# Patient Record
Sex: Female | Born: 1965
Health system: Southern US, Community
[De-identification: ages and names within clinical notes are randomized; demographics above are authoritative.]

## PROBLEM LIST (undated history)

## (undated) DIAGNOSIS — M1990 Unspecified osteoarthritis, unspecified site: Secondary | ICD-10-CM

## (undated) DIAGNOSIS — H409 Unspecified glaucoma: Secondary | ICD-10-CM

## (undated) DIAGNOSIS — K219 Gastro-esophageal reflux disease without esophagitis: Secondary | ICD-10-CM

## (undated) DIAGNOSIS — IMO0001 Reserved for inherently not codable concepts without codable children: Secondary | ICD-10-CM

## (undated) DIAGNOSIS — Z800 Family history of malignant neoplasm of digestive organs: Secondary | ICD-10-CM

## (undated) DIAGNOSIS — I10 Essential (primary) hypertension: Secondary | ICD-10-CM

## (undated) DIAGNOSIS — Z973 Presence of spectacles and contact lenses: Secondary | ICD-10-CM

## (undated) DIAGNOSIS — Z9221 Personal history of antineoplastic chemotherapy: Secondary | ICD-10-CM

## (undated) DIAGNOSIS — C786 Secondary malignant neoplasm of retroperitoneum and peritoneum: Secondary | ICD-10-CM

## (undated) DIAGNOSIS — E041 Nontoxic single thyroid nodule: Secondary | ICD-10-CM

## (undated) DIAGNOSIS — R351 Nocturia: Secondary | ICD-10-CM

## (undated) DIAGNOSIS — Z8719 Personal history of other diseases of the digestive system: Secondary | ICD-10-CM

## (undated) DIAGNOSIS — E119 Type 2 diabetes mellitus without complications: Secondary | ICD-10-CM

## (undated) DIAGNOSIS — G4730 Sleep apnea, unspecified: Secondary | ICD-10-CM

## (undated) DIAGNOSIS — D490 Neoplasm of unspecified behavior of digestive system: Secondary | ICD-10-CM

## (undated) DIAGNOSIS — R2689 Other abnormalities of gait and mobility: Secondary | ICD-10-CM

## (undated) DIAGNOSIS — C801 Malignant (primary) neoplasm, unspecified: Secondary | ICD-10-CM

## (undated) DIAGNOSIS — G629 Polyneuropathy, unspecified: Secondary | ICD-10-CM

## (undated) DIAGNOSIS — Z8489 Family history of other specified conditions: Secondary | ICD-10-CM

## (undated) HISTORY — PX: HERNIA REPAIR: SHX51

## (undated) HISTORY — PX: ABDOMINAL HYSTERECTOMY: SHX81

## (undated) HISTORY — DX: Secondary malignant neoplasm of retroperitoneum and peritoneum: C78.6

## (undated) HISTORY — DX: Family history of malignant neoplasm of digestive organs: Z80.0

## (undated) HISTORY — DX: Neoplasm of unspecified behavior of digestive system: D49.0

## (undated) HISTORY — DX: Malignant (primary) neoplasm, unspecified: C80.1

## (undated) HISTORY — PX: THYROIDECTOMY, PARTIAL: SHX18

## (undated) HISTORY — DX: Essential (primary) hypertension: I10

## (undated) HISTORY — PX: CATARACT EXTRACTION: SUR2

---

## 1999-11-01 ENCOUNTER — Encounter: Payer: Self-pay | Admitting: Family Medicine

## 1999-11-01 ENCOUNTER — Ambulatory Visit (HOSPITAL_COMMUNITY): Admission: RE | Admit: 1999-11-01 | Discharge: 1999-11-01 | Payer: Self-pay | Admitting: Family Medicine

## 2004-01-20 ENCOUNTER — Other Ambulatory Visit: Admission: RE | Admit: 2004-01-20 | Discharge: 2004-01-20 | Payer: Self-pay | Admitting: Family Medicine

## 2004-01-23 ENCOUNTER — Ambulatory Visit (HOSPITAL_COMMUNITY): Admission: RE | Admit: 2004-01-23 | Discharge: 2004-01-23 | Payer: Self-pay | Admitting: Family Medicine

## 2004-09-06 ENCOUNTER — Encounter (INDEPENDENT_AMBULATORY_CARE_PROVIDER_SITE_OTHER): Payer: Self-pay | Admitting: Specialist

## 2004-09-07 ENCOUNTER — Inpatient Hospital Stay (HOSPITAL_COMMUNITY): Admission: RE | Admit: 2004-09-07 | Discharge: 2004-09-08 | Payer: Self-pay | Admitting: *Deleted

## 2009-11-29 ENCOUNTER — Other Ambulatory Visit: Admission: RE | Admit: 2009-11-29 | Discharge: 2009-11-29 | Payer: Self-pay | Admitting: Family Medicine

## 2010-08-16 ENCOUNTER — Emergency Department (HOSPITAL_COMMUNITY): Payer: 59

## 2010-08-16 ENCOUNTER — Inpatient Hospital Stay (HOSPITAL_COMMUNITY)
Admission: EM | Admit: 2010-08-16 | Discharge: 2010-08-19 | DRG: 305 | Disposition: A | Discharge status: Home / Self Care | Payer: 59 | Attending: Internal Medicine | Admitting: Internal Medicine

## 2010-08-16 DIAGNOSIS — R0602 Shortness of breath: Secondary | ICD-10-CM | POA: Diagnosis present

## 2010-08-16 DIAGNOSIS — N289 Disorder of kidney and ureter, unspecified: Secondary | ICD-10-CM | POA: Diagnosis present

## 2010-08-16 DIAGNOSIS — I10 Essential (primary) hypertension: Principal | ICD-10-CM | POA: Diagnosis present

## 2010-08-16 DIAGNOSIS — J45909 Unspecified asthma, uncomplicated: Secondary | ICD-10-CM | POA: Diagnosis present

## 2010-08-16 DIAGNOSIS — E876 Hypokalemia: Secondary | ICD-10-CM | POA: Diagnosis present

## 2010-08-16 DIAGNOSIS — H409 Unspecified glaucoma: Secondary | ICD-10-CM | POA: Diagnosis present

## 2010-08-16 DIAGNOSIS — E049 Nontoxic goiter, unspecified: Secondary | ICD-10-CM | POA: Diagnosis present

## 2010-08-16 DIAGNOSIS — E7800 Pure hypercholesterolemia, unspecified: Secondary | ICD-10-CM | POA: Diagnosis present

## 2010-08-16 DIAGNOSIS — R7401 Elevation of levels of liver transaminase levels: Secondary | ICD-10-CM | POA: Diagnosis present

## 2010-08-16 DIAGNOSIS — D649 Anemia, unspecified: Secondary | ICD-10-CM | POA: Diagnosis present

## 2010-08-16 DIAGNOSIS — R7402 Elevation of levels of lactic acid dehydrogenase (LDH): Secondary | ICD-10-CM | POA: Diagnosis present

## 2010-08-16 DIAGNOSIS — K219 Gastro-esophageal reflux disease without esophagitis: Secondary | ICD-10-CM | POA: Diagnosis present

## 2010-08-16 DIAGNOSIS — Z23 Encounter for immunization: Secondary | ICD-10-CM

## 2010-08-16 LAB — CBC
HCT: 37.4 % (ref 36.0–46.0)
Hemoglobin: 12.7 g/dL (ref 12.0–15.0)
MCH: 30.3 pg (ref 26.0–34.0)
MCHC: 34 g/dL (ref 30.0–36.0)
MCV: 89.3 fL (ref 78.0–100.0)
RDW: 14.5 % (ref 11.5–15.5)

## 2010-08-16 LAB — COMPREHENSIVE METABOLIC PANEL
ALT: 111 U/L — ABNORMAL HIGH (ref 0–35)
BUN: 12 mg/dL (ref 6–23)
CO2: 23 mEq/L (ref 19–32)
Calcium: 8.4 mg/dL (ref 8.4–10.5)
Creatinine, Ser: 1.1 mg/dL (ref 0.4–1.2)
GFR calc non Af Amer: 54 mL/min — ABNORMAL LOW (ref >60)
Glucose, Bld: 107 mg/dL — ABNORMAL HIGH (ref 70–99)
Total Protein: 6.5 g/dL (ref 6.0–8.3)

## 2010-08-16 LAB — DIFFERENTIAL
Basophils Absolute: 0 10*3/uL (ref 0.0–0.1)
Basophils Relative: 0 % (ref 0–1)
Eosinophils Absolute: 0.1 10*3/uL (ref 0.0–0.7)
Eosinophils Relative: 1 % (ref 0–5)
Lymphocytes Relative: 40 % (ref 12–46)
Lymphs Abs: 2.4 10*3/uL (ref 0.7–4.0)
Monocytes Absolute: 0.4 10*3/uL (ref 0.1–1.0)
Monocytes Relative: 7 % (ref 3–12)
Neutro Abs: 3.1 10*3/uL (ref 1.7–7.7)
Neutrophils Relative %: 52 % (ref 43–77)

## 2010-08-16 LAB — URINALYSIS, ROUTINE W REFLEX MICROSCOPIC
Bilirubin Urine: NEGATIVE
Hgb urine dipstick: NEGATIVE
Ketones, ur: NEGATIVE mg/dL
Specific Gravity, Urine: 1.008 (ref 1.005–1.030)
Urobilinogen, UA: 0.2 mg/dL (ref 0.0–1.0)

## 2010-08-16 LAB — POCT CARDIAC MARKERS
CKMB, poc: <1 ng/mL — ABNORMAL LOW (ref 1.0–8.0)
Myoglobin, poc: 47.5 ng/mL (ref 12–200)

## 2010-08-16 LAB — LIPASE, BLOOD: Lipase: 27 U/L (ref 11–59)

## 2010-08-16 LAB — URINE MICROSCOPIC-ADD ON

## 2010-08-16 LAB — CARDIAC PANEL(CRET KIN+CKTOT+MB+TROPI)
Relative Index: 1.3 (ref 0.0–2.5)
Total CK: 153 U/L (ref 7–177)
Troponin I: 0.04 ng/mL (ref 0.00–0.06)

## 2010-08-16 LAB — MRSA PCR SCREENING: MRSA by PCR: NEGATIVE

## 2010-08-17 ENCOUNTER — Inpatient Hospital Stay (HOSPITAL_COMMUNITY): Payer: 59

## 2010-08-17 LAB — COMPREHENSIVE METABOLIC PANEL
ALT: 86 U/L — ABNORMAL HIGH (ref 0–35)
AST: 83 U/L — ABNORMAL HIGH (ref 0–37)
Albumin: 3 g/dL — ABNORMAL LOW (ref 3.5–5.2)
Calcium: 8.1 mg/dL — ABNORMAL LOW (ref 8.4–10.5)
GFR calc Af Amer: >60 mL/min (ref >60)
Sodium: 136 mEq/L (ref 135–145)
Total Protein: 6 g/dL (ref 6.0–8.3)

## 2010-08-17 LAB — CARDIAC PANEL(CRET KIN+CKTOT+MB+TROPI)
CK, MB: 1.8 ng/mL (ref 0.3–4.0)
Relative Index: 1.3 (ref 0.0–2.5)
Total CK: 144 U/L (ref 7–177)

## 2010-08-17 LAB — CBC
HCT: 33.4 % — ABNORMAL LOW (ref 36.0–46.0)
Hemoglobin: 11.3 g/dL — ABNORMAL LOW (ref 12.0–15.0)
MCHC: 33.8 g/dL (ref 30.0–36.0)
WBC: 7.8 10*3/uL (ref 4.0–10.5)

## 2010-08-17 LAB — T4, FREE: Free T4: 1.07 ng/dL (ref 0.80–1.80)

## 2010-08-18 LAB — HEPATIC FUNCTION PANEL
ALT: 74 U/L — ABNORMAL HIGH (ref 0–35)
AST: 51 U/L — ABNORMAL HIGH (ref 0–37)
Indirect Bilirubin: 1.5 mg/dL — ABNORMAL HIGH (ref 0.3–0.9)
Total Protein: 7.3 g/dL (ref 6.0–8.3)

## 2010-08-18 LAB — BASIC METABOLIC PANEL
BUN: 12 mg/dL (ref 6–23)
Chloride: 100 mEq/L (ref 96–112)
Glucose, Bld: 101 mg/dL — ABNORMAL HIGH (ref 70–99)
Potassium: 3.3 mEq/L — ABNORMAL LOW (ref 3.5–5.1)

## 2010-08-18 LAB — IRON AND TIBC
Iron: 43 ug/dL (ref 42–135)
TIBC: 389 ug/dL (ref 250–470)

## 2010-08-18 LAB — VITAMIN B12: Vitamin B-12: 317 pg/mL (ref 211–911)

## 2010-08-19 LAB — BASIC METABOLIC PANEL
CO2: 24 mEq/L (ref 19–32)
Calcium: 8.9 mg/dL (ref 8.4–10.5)
GFR calc Af Amer: 52 mL/min — ABNORMAL LOW (ref >60)
GFR calc non Af Amer: 43 mL/min — ABNORMAL LOW (ref >60)
Potassium: 3.8 mEq/L (ref 3.5–5.1)
Sodium: 137 mEq/L (ref 135–145)

## 2010-08-19 LAB — CBC
MCHC: 33.7 g/dL (ref 30.0–36.0)
MCV: 92.2 fL (ref 78.0–100.0)
Platelets: 336 10*3/uL (ref 150–400)
RDW: 15.3 % (ref 11.5–15.5)
WBC: 8.8 10*3/uL (ref 4.0–10.5)

## 2010-08-20 LAB — HEPATITIS PANEL, ACUTE
HCV Ab: NEGATIVE
Hep B C IgM: NEGATIVE
Hepatitis B Surface Ag: NEGATIVE

## 2010-08-20 NOTE — Discharge Summary (Signed)
Sarah [name], [name] NO.:  [id number]  MEDICAL RECORD NO.:  [id number]           PATIENT TYPE:  LOCATION:                                 FACILITY:  PHYSICIAN:  Andreas Blower, MD       DATE OF BIRTH:  [birth year]  DATE OF ADMISSION: DATE OF DISCHARGE:                              DISCHARGE SUMMARY   PRIMARY CARE PHYSICIAN:  Dr. Deatra James with [facility].  DISCHARGE DIAGNOSES: 1. Malignant hypertension/hypertensive urgency. 2. Acute shortness of breath, resolved. 3. Mild renal insufficiency. 4. Thyroid mass/multinodular thyroid goiter with normal TSH, free T4,     and T3. 5. Transaminitis. 6. Hypokalemia, resolved. 7. Mild anemia, resolved. 8. Gastroesophageal reflux disease. 9. History of glaucoma. 10.Hypercholesterolemia. 11.History of uterine fibroids, status post abdominal supracervical     hysterectomy and left salpingectomy.  DISCHARGE MEDICATIONS: 1. Amlodipine 10 mg p.o. daily. 2. Carvedilol 6.25 mg p.o. twice daily with meals. 3. Hydralazine 25 mg p.o. 3 times a day. 4. Albuterol 1-2 2 puffs inhaled every 4 hours as needed for shortness     of breath. 5. Fish oil 1 capsule p.o. daily. 6. Lumigan 1 drop both twice daily at bedtime.  BRIEF ADMITTING HISTORY AND PHYSICAL:  Sarah [name] is a 44 year old African American female with history of hypercholesterolemia and hypertension, had not been on any medications prior to admission, and GERD who presented from her primary care physician's office with shortness of breath.  She was also found to have high blood pressure with 229/129 on admission.  RADIOLOGY/IMAGING:  The patient had portable chest x-ray on August 16, 2010, which showed cardiomegaly.  Enlarged left lobe of the thyroid gland. The patient had ultrasound thyroid gland and adjacent soft tissue on August 17, 2010, which showed multinodular thyroid goiter.  The largest lesion in the left lower lobe is complex and  fine-needle aspiration/biopsy suggested.  The patient had a 2-D echocardiogram on August 17, 2010, which showed that the left ventricle cavity size was normal.  There was moderate concentric hypertrophy.  Systolic function was normal.  Ejection fraction was 55-60%.  Wall motion was normal.  There were no regional wall motion abnormalities.  Left atrium was mildly dilated.  LABORATORY DATA:  CBC shows a white count of 8.8, hemoglobin 12.7, hematocrit 37.7, and platelet count 336.  Electrolytes normal with a creatinine of 1.34.  Initial creatinine on presentation was 1.10.  Liver function tests showed T-bili is 1.7 and direct is 0.2, indirect is 1.5, alk phos is 44, AST is 51, ALT is 74, total protein is 7.3, albumin is 3.6, calcium is 8.9, magnesium is 1.9.  Troponins negative x3.  BNP was 583.  TSH was 0.996, free T4 is 1.07, T3 is 85.9.  Serum iron is 43, TIBC is 389, and UIBC is 346.  Vitamin B12 is 317, serum folate is 9.7, and ferritin is 227.  UA was negative for nitrates and leukocytes.  MRSA by PCR was negative.  HOSPITAL COURSE BY PROBLEM:1. Malignant hypertension/hypertensive urgency:  Initially, the     patient was admitted to the Step-[facility] and was started on  a     nitroglycerin drip to help control her blood pressure.  She was     also started on hydralazine.  She was started on amlodipine and     carvedilol during the course of the hospital stay and was     eventually weaned off nitroglycerin drip.  IV hydralazine was     transitioned to p.o. At the time of discharge, the patient's blood     pressure is 131/78.  The patient was instructed to follow up with     Dr. Wynelle Link for further titration of her antihypertensive medications.     We would consider having her be titrated off hydralazine in favor     of another agent if necessary.  But we will defer to her primary     care physician. 2. Dyspnea/shortness of breath:  The patient was admitted and was     ruled out for acute  coronary syndrome.  2-D echocardiogram was     obtained with the above results to assess for any diastolic     dysfunction.  The patient did not have any diastolic dysfunction on     the echo results.  It was initally thought she had pulmonary     edema and as a result she was given 2 doses of Lasix.  As     her blood pressure was controlled, her breathing had improved and     was at baseline at the time of discharge.  I suspect that her     shortness of breath may be due to hypertensive urgency. 3. Mild renal insufficiency:  At presentation, her creatinine was     1.10.  Her creatinine at the time of discharge was 1.34.  I am     uncertain if the rise in her creatinine is due to Lasix versus     relative hyperperfusion from better control of her blood pressure.     The patient was instructed to follow up with her primary care     physician and have her renal function checked and if still elevated     could consider further evaluation at that time. 4. Thyroid mass/multinodular thyroid goiter:  TSH, free T4, and T3     were normal.  The patient had ultrasound of thyroid gland with     results as dictated above.  IR was consulted for biopsy, however,     could not be done in due time.  As the patient is stable with     normal TSH, free T4, and T3, the fine needle aspiration and biopsy     could be done as an outpatient on an nonemergent basis under the     care of her primary care physician. 5. Transaminitis:  Hepatitis panel is pending.  Dr. Wynelle Link, her primary     care physician to follow up the results of the hepatitis panel.     Consider checking her liver function test as outpatient and if     still elevated could consider doing an ultrasound for further     evaluation, but we will defer to her primary care physician. 6. Hypokalemia, most likely secondary to Lasix use, resolved with     replacement. 7. Mild anemia:  I suspect dilution of hemoglobin was normal at the     time of  discharge. 8. GERD:  It was not an active issue at this time. 9. History of glaucoma:  Continue the patient on home eye drops. 10.Hypercholesterolemia:  Continue the patient on fish oil at the time     of discharge.  Time spent on discharge talking to the patient, counseling the patient, and coordinating care was 35 minutes.  DISPOSITION AND FOLLOWUP:  The patient is to follow up with Dr. Wynelle Link in 1 week.  Dr. Wynelle Link to address her blood pressure and further titration off antihypertensive medications as necessary.  Dr. Wynelle Link to also arrange for fine-needle aspiration and biopsy of thyroid mass.  Dr. Wynelle Link to follow up on the hepatitis panel and order liver function tests as outpatient. If abnormal, consider further work at that time.  Dr. Wynelle Link to also follow up on the patient's electrolytes and creatinine and if abnormal further workup at that time.   Andreas Blower, MD   SR/MEDQ  D:  08/19/2010  T:  08/20/2010  Job:  [id number]  Electronically Signed by Wardell Heath Kyla Duffy  on 08/20/2010 09:23:38 PM

## 2010-08-22 ENCOUNTER — Other Ambulatory Visit: Payer: Self-pay | Admitting: Family Medicine

## 2010-08-22 DIAGNOSIS — E042 Nontoxic multinodular goiter: Secondary | ICD-10-CM

## 2010-08-22 NOTE — H&P (Signed)
Sarah [name], MANWARREN NO.:  [id number]  MEDICAL RECORD NO.:  [id number]           PATIENT TYPE:  I  LOCATION:  2114                         FACILITY:  [facility]  PHYSICIAN:  Vania Rea, M.D. DATE OF BIRTH:  Aug 03, 1965  DATE OF ADMISSION:  08/16/2010 DATE OF DISCHARGE:                             HISTORY & PHYSICAL   PRIMARY CARE PHYSICIAN:  Deatra James, MD with [facility].  CHIEF COMPLAINT:  Shortness of breath.  HISTORY OF PRESENT ILLNESS:  Sarah [name] is a 45 year old African- American female with past medical history of high cholesterol, hypertension, and GERD, who presents from primary care physician's office today with shortness of breath.  The patient reports symptoms have been ongoing since early summer at which time, she was prescribed albuterol inhaler by primary care physician for question of asthma.  The patient states albuterol has been ineffective in relieving her symptoms and symptoms have become notably worse over this past week.  The patient states she feels like she has used the entire inhaler this week alone. She also reports recent development of dyspnea on exertion and dependent edema.  The patient denies any real change in her shortness of breath when lying down.  She denies any recent fever, abdominal pain, nausea, vomiting, or recent weight loss.  The patient does report audible wheeze as well as some intermittent midsternal chest pressure associated with her shortness of breath.  The patient was diagnosed in 2008 with hypertension during preop workup for hysterectomy, the patient was prescribed medication at that time, however, was taken off postoperatively as blood pressure was controlled without medications.  The patient also states that during preop workup for hysterectomy she experienced an irregular heart beat and underwent a stress test that was negative.  The patient is uncertain of irregular heartbeat and no record  of this in E-chart.  PAST MEDICAL HISTORY: 1. GERD. 2. History of hypertension, no longer on medication. 3. Uterine fibroids status post hysterectomy in 2008. 4. Hypercholesterolemia. 5. Glaucoma. 6. Question of cardiac arrhythmia in 2008 that is not documented in     the E-chart.  MEDICATIONS: 1. Albuterol inhaler 1-2 puffs q.4 h p.r.n. shortness of breath. 2. Lumigan eyedrops 1 drop both eyes at bedtime. 3. Fish oil over-the-counter 1 capsule p.o. daily.  ALLERGIES:  No known drug allergies.  FAMILY HISTORY:  The patient's mother alive with history of diabetes, hypertension, and what the patient suspects was a thyroidectomy for unknown reasons.  Father with hypertension, diabetes, and coronary artery disease.  One brother with hypertension.  SOCIAL HISTORY:  The patient is married.  She has two children.  She works full-time for [employer] in [employer].  She is a nonsmoker, nondrinker.  REVIEW OF SYSTEMS:  As stated in HPI, otherwise negative.  PHYSICAL EXAM:  VITAL SIGNS:  Blood pressure 229/129 on arrival, now down to 197/123, heart rate 80, respirations 24, temperature 98.7, O2 sat 100% on room air. GENERAL:  This is a overweight African-American female sitting upright in stretcher, in no acute distress. HEENT:  Head is normocephalic, atraumatic.  Eyes, extraocular movements are intact without any scleral  icterus or injection.  Ear, nose, and throat, mucous membranes are moist.  No oropharyngeal lesions are noted. NECK:  Supple.  Unable to palpate any thyromegaly.  No JVD or carotid bruits. CHEST:  With symmetrical movement, nontender to palpation. CARDIOVASCULAR:  S1 and S2, regular rate and rhythm.  No murmur, rub, or gallop.  No lower extremity edema.  RESPIRATORY:  Lung sounds are clear to auscultation bilaterally.  No wheezes, rales, or crackles.  No increased work of breathing.  GI:  Abdomen is soft, nontender, nondistended with positive bowel sounds.  No  appreciated masses or hepatosplenomegaly.  NEUROLOGICALLY:  The patient is able to move all extremities x4 without motor sensory deficit on exam. PSYCHOLOGICALLY:  The patient is alert and oriented x4 with pleasant mood and affect.  PERTINENT LABS AND ANCILLARY STUDIES:  White cell count 6.0, platelet count 321, hemoglobin 12.7, hematocrit 37.4.  Sodium 135, potassium 3.2, chloride 107, CO2 of 23, BUN 12, creatinine 1.10, serum glucose 107, total bilirubin 0.7, alkaline phosphatase 46, AST 133, ALT 111, albumin 3.4, calcium 8.4, lipase 27.  Cardiac point-of-care negative x1.  Urinalysis, yellow clear with specific gravity 1.008, 100 protein, otherwise unremarkable.  Urine microscopic is unremarkable.  Chest x-ray shows cardiomegaly with enlarged left lobe of the thyroid causing tracheal deviation.  EKG shows sinus rhythm with no acute T-wave abnormalities.  No evidence of LVH  ASSESSMENT/PLAN: 1. Hypertensive urgency.  Question related to systemic disease versus     reaction to frequent use of inhalers throughout the week.  The     patient has responded well in the emergency department to     nitroglycerin drip.  We will admit to ICU and continue drip at this     time.  Also order for p.r.n. hydralazine.  It does not seem like     the patient's heart rate will tolerate beta-blocker therapy at this     time.  In addition, creatinine is at borderline, therefore we will     add amlodipine for daily treatment of hypertension. 2. Dyspnea.  This could be related to tracheal deviation seen on chest     x-ray in setting of thyromegaly, however, given hypertensive     urgency and cardiomegaly seen on chest x-ray, we will work up     further with cardiac enzymes to rule out acute coronary syndrome as     well as 2-D echocardiogram to assess for any diastolic dysfunction.     We will check thyroid function tests. 3. Thyromegaly.  This is a new finding during this hospitalization     with  the enlargement of the left lobe of the thyroid gland causing     tracheal deviation.  The patient's symptoms have been ongoing for     several months and she is in no acute distress at time of     admission.  We will further evaluate with thyroid ultrasound.     Would consider ENT consult in the morning for further treatment and     possible surgical intervention. 4. Transaminitis.  Uncertain whether this is reactive.  Exam is     completely benign.  We will monitor. 5. Hypokalemia.  We will replete and recheck in the morning. 6. Prophylaxis.  We will order for SCDs for DVT prophylaxis.     Cordelia Pen, NP   ______________________________ Vania Rea, M.D.    LE/MEDQ  D:  08/16/2010  T:  08/16/2010  Job:  [id number]  cc:   Charise Carwin  Wynelle Link, M.D.  Electronically Signed by Cordelia Pen NP on 08/20/2010 10:03:55 AM Electronically Signed by Vania Rea M.D. on 08/22/2010 02:29:01 AM

## 2010-08-29 ENCOUNTER — Other Ambulatory Visit: Payer: Self-pay | Admitting: Diagnostic Radiology

## 2010-08-29 ENCOUNTER — Other Ambulatory Visit (HOSPITAL_COMMUNITY)
Admission: RE | Admit: 2010-08-29 | Discharge: 2010-08-29 | Disposition: A | Discharge status: Home / Self Care | Payer: 59 | Source: Ambulatory Visit | Attending: Diagnostic Radiology | Admitting: Diagnostic Radiology

## 2010-08-29 ENCOUNTER — Ambulatory Visit
Admission: RE | Admit: 2010-08-29 | Discharge: 2010-08-29 | Disposition: A | Discharge status: Home / Self Care | Payer: 59 | Source: Ambulatory Visit | Attending: Family Medicine | Admitting: Family Medicine

## 2010-08-29 DIAGNOSIS — E049 Nontoxic goiter, unspecified: Secondary | ICD-10-CM | POA: Insufficient documentation

## 2010-08-29 DIAGNOSIS — E042 Nontoxic multinodular goiter: Secondary | ICD-10-CM

## 2010-10-17 ENCOUNTER — Encounter (HOSPITAL_COMMUNITY)
Admission: RE | Admit: 2010-10-17 | Discharge: 2010-10-17 | Disposition: A | Discharge status: Home / Self Care | Payer: 59 | Source: Ambulatory Visit | Attending: General Surgery | Admitting: General Surgery

## 2010-10-17 ENCOUNTER — Ambulatory Visit (HOSPITAL_COMMUNITY)
Admission: RE | Admit: 2010-10-17 | Discharge: 2010-10-17 | Disposition: A | Discharge status: Home / Self Care | Payer: 59 | Source: Ambulatory Visit | Attending: General Surgery | Admitting: General Surgery

## 2010-10-17 ENCOUNTER — Other Ambulatory Visit (HOSPITAL_COMMUNITY): Payer: Self-pay | Admitting: General Surgery

## 2010-10-17 DIAGNOSIS — Z01818 Encounter for other preprocedural examination: Secondary | ICD-10-CM | POA: Insufficient documentation

## 2010-10-17 DIAGNOSIS — Z01812 Encounter for preprocedural laboratory examination: Secondary | ICD-10-CM | POA: Insufficient documentation

## 2010-10-17 DIAGNOSIS — E041 Nontoxic single thyroid nodule: Secondary | ICD-10-CM

## 2010-10-17 DIAGNOSIS — E049 Nontoxic goiter, unspecified: Secondary | ICD-10-CM | POA: Insufficient documentation

## 2010-10-17 DIAGNOSIS — Z01810 Encounter for preprocedural cardiovascular examination: Secondary | ICD-10-CM | POA: Insufficient documentation

## 2010-10-17 DIAGNOSIS — I517 Cardiomegaly: Secondary | ICD-10-CM | POA: Insufficient documentation

## 2010-10-17 LAB — CBC
MCH: 29.5 pg (ref 26.0–34.0)
MCV: 86.7 fL (ref 78.0–100.0)
Platelets: 407 10*3/uL — ABNORMAL HIGH (ref 150–400)
RDW: 13.9 % (ref 11.5–15.5)
WBC: 7.3 10*3/uL (ref 4.0–10.5)

## 2010-10-17 LAB — COMPREHENSIVE METABOLIC PANEL
Albumin: 3.6 g/dL (ref 3.5–5.2)
BUN: 11 mg/dL (ref 6–23)
Creatinine, Ser: 0.77 mg/dL (ref 0.4–1.2)
GFR calc Af Amer: >60 mL/min (ref >60)
Total Protein: 8.1 g/dL (ref 6.0–8.3)

## 2010-10-17 LAB — SURGICAL PCR SCREEN: MRSA, PCR: NEGATIVE

## 2010-10-23 ENCOUNTER — Observation Stay (HOSPITAL_COMMUNITY)
Admission: RE | Admit: 2010-10-23 | Discharge: 2010-10-24 | Disposition: A | Discharge status: Home / Self Care | Payer: 59 | Source: Ambulatory Visit | Attending: General Surgery | Admitting: General Surgery

## 2010-10-23 ENCOUNTER — Other Ambulatory Visit (INDEPENDENT_AMBULATORY_CARE_PROVIDER_SITE_OTHER): Payer: Self-pay | Admitting: General Surgery

## 2010-10-23 DIAGNOSIS — Z01812 Encounter for preprocedural laboratory examination: Secondary | ICD-10-CM | POA: Insufficient documentation

## 2010-10-23 DIAGNOSIS — J45909 Unspecified asthma, uncomplicated: Secondary | ICD-10-CM | POA: Insufficient documentation

## 2010-10-23 DIAGNOSIS — Z01810 Encounter for preprocedural cardiovascular examination: Secondary | ICD-10-CM | POA: Insufficient documentation

## 2010-10-23 DIAGNOSIS — Z01818 Encounter for other preprocedural examination: Secondary | ICD-10-CM | POA: Insufficient documentation

## 2010-10-23 DIAGNOSIS — E049 Nontoxic goiter, unspecified: Principal | ICD-10-CM | POA: Insufficient documentation

## 2010-10-23 DIAGNOSIS — K219 Gastro-esophageal reflux disease without esophagitis: Secondary | ICD-10-CM | POA: Insufficient documentation

## 2010-10-23 DIAGNOSIS — I10 Essential (primary) hypertension: Secondary | ICD-10-CM | POA: Insufficient documentation

## 2010-10-26 NOTE — Discharge Summary (Signed)
Sarah [name], Sarah [name]               ACCOUNT NO.:  [id number]   MEDICAL RECORD NO.:  [id number]          PATIENT TYPE:  INP   LOCATION:  0450                         [facility]ACILITY:  [facility]   PHYSICIAN:  Almedia Balls. Fore, M.D.   DATE OF BIRTH:  1965/10/12   DATE OF ADMISSION:  09/06/2004  DATE OF DISCHARGE:  09/08/2004                                 DISCHARGE SUMMARY   HISTORY:  The patient is a 45 year old with abnormal uterine bleeding,  pelvic pain, uterine enlargement and left adnexal cyst for hysterectomy,  possible bilateral salpingo-oophorectomy.  The remainder of her history and  physical are as previously dictated.   LABORATORY DATA:  Hemoglobin 14.1 preoperatively.  BMET panel normal except  for glucose slightly elevated at 107.  Electrocardiogram showed T wave  abnormalities with possible anterolateral ischemia and a T wave abnormality  with possible inferior ischemia.  The patient had been evaluated by her  cardiologist with findings of normal cardiac function prior to surgery.   HOSPITAL COURSE:  The patient was taken to the operating room on September 06, 2004 at which time abdominal supracervical hysterectomy, left salpingectomy  for large left hydrosalpinx and bilateral ovarian cyst destruction were  carried out.  The patient did well postoperatively.  Diet and ambulation  were progressed over several days postoperatively.  She had problems with  pain control and was maintained in the hospital through March 31.  On the  morning of April 1, she was afebrile and experiencing no problems except for  pain which was relieved by oral analgesics, and it was felt that she could  be discharged at this time.   FINAL DIAGNOSES:  1.  Abnormal uterine bleeding.  2.  Pelvic pain.  3.  Uterine enlargement.  4.  Left hydrosalpinx.  5.  Bilateral ovarian cyst.   Pathology report unavailable at the time of dictation.   DISPOSITION:  Discharged home to return to the office in two weeks for  followup.  She was fully ambulatory, on a regular diet, and in good  condition at the time of discharge.  She was instructed to gradually  progress her activities over several weeks at home and to limit lifting and  driving for two weeks.  She was given prescriptions for [facility]  generic, #30, to be taken 1-2 q.6h. p.r.n. pain.  Doxycycline 100 mg, #12,  to be taken 1 b.i.d.      SRF/MEDQ  D:  09/08/2004  T:  09/08/2004  Job:  [id number]

## 2010-10-26 NOTE — Op Note (Signed)
[name], Sarah [name]               ACCOUNT NO.:  [id number]   MEDICAL RECORD NO.:  [id number]          [name] TYPE:  AMB   LOCATION:  DAY                          FACILITY:  [facility]   PHYSICIAN:  Almedia Balls. Fore, M.D.   DATE OF BIRTH:  May 19, 1966   DATE OF PROCEDURE:  09/06/2004  DATE OF DISCHARGE:                                 OPERATIVE REPORT   PREOPERATIVE DIAGNOSES:  1.  Abnormal uterine bleeding.  2.  Pelvic pain.  3.  Uterine enlargement.  4.  Left adnexal cyst.   POSTOPERATIVE DIAGNOSES:  1.  Abnormal uterine bleeding.  2.  Pelvic pain.  3.  Uterine enlargement.  4.  Left adnexal cyst.  5.  Pending pathology.   OPERATION:  Abdominal supracervical hysterectomy, left salpingectomy.   ANESTHESIA:  General orotracheal anesthesia.   SURGEON:  Almedia Balls. Sarah [name], M.D.   FIRST ASSISTANT:  Leatha Gilding. Mezer, M.D.   INDICATIONS FOR PROCEDURE:  [name] is a 45 year old with the above noted  problems who has been counseled as to the need for surgery and the type of  surgery to be performed.  She has also been counseled as to the risks  involved to include risks of anesthesia, injury to bowel, bladder, blood  vessels, ureters, postoperative hemorrhage, infection, recuperation and use  of hormone replacement should her ovaries be removed.  She fully understands  all these considerations and wishes to proceed on September 06, 2004.   FINDINGS:  On entry into the abdomen, exploration of the upper abdomen  revealed omental adhesions at the intraperitoneal area below the umbilicus  thought to be secondary to previous laparoscopy.  Palpation of the lower  liver edge, gallbladder area, spleen, kidneys, para-aortic areas and  visualization and palpation of the appendix showed these organs to be  normal.  In the pelvis, the uterus was enlarged to approximately 12+ weeks  size.  There were obvious myometrial myomata present.  There was a large  cyst in the left tube.  Both ovaries had multiple  small cysts present.  The  ovaries otherwise were normal.   DESCRIPTION OF PROCEDURE:  With the [name] under general anesthesia,  prepared and draped in the usual sterile fashion, with the Foley catheter in  the bladder, a lower abdominal transverse incision was made and carried into  the peritoneal cavity without difficulty.  Self-retaining retractor was  placed, and the bowel was packed off.  The left tube was excised using Bovie  electrocoagulation for better visualization because of the size of the cyst  which was approximately 6 to 7 cm in greatest dimension.  This appeared to  be simple and hydrosalpinx probably secondary to previous tubal ligation.  Kelly clamps were placed at the uterine ovarian anastomoses tubes and round  ligaments bilaterally for traction and hemostasis.  Round ligaments  bilaterally were transected using Bovie electrocoagulation with development  of a bladder flap anteriorly and entry into retroperitoneal, paravesical and  pararectal spaces.  Uterine vessels were then skeletonized, clamped, cut and  suture ligated with #1 chromic catgut.  Cardinal ligaments bilaterally were  clamped using  Heaney clamps, cut and suture ligated with #1 chromic catgut.  The uterine fundus was then excised by using Bovie electrocoagulation to  core out the endocervix and portion of the exocervix.  The cervical stump  area and some vaginal tissue were reapproximated and rendered hemostatic  with interrupted sutures of #1 chromic catgut.  The area was lavaged with  copious amounts of lactated Ringer's solution and after noting hemostasis  was maintained in the surgical area, the area was reperitonealized with  suture of #1 chromic catgut.  The ovarian pedicles were suspended from the  round ligaments bilaterally with interrupted sutures of #1 chromic catgut.  The area was again inspected for hemostasis and after noting this was  maintained and that sponge and instrument counts were  correct, the  peritoneum was closed with a continuous suture of 0 Vicryl.  Each layer was  lavaged fully with lactated Ringer's solution prior to full closure.  Fascia  was closed with two sutures of 0 Vicryl which were brought from the lateral  aspects of the incision and tied separately in the midline.  Subcutaneous  fat was reapproximated with interrupted horizontal mattress sutures of #1  chromic catgut.  Skin was closed with subcuticular suture of 3-0 plain  catgut.   ESTIMATED BLOOD LOSS:  100 mL or less.   [name] was taken to the recovery room in good condition with clear urine in  the Foley catheter tubing.  She will be placed on a 23-hour observation  following surgery.      SRF/MEDQ  D:  09/06/2004  T:  09/06/2004  Job:  [id number]   cc:   Leatha Gilding. Mezer, M.D.  [address]  [place]  [place] [address]  Fax: [phone]

## 2010-10-26 NOTE — H&P (Signed)
NAMENISHKA, Sarah [name]               ACCOUNT NO.:  [id number]   MEDICAL RECORD NO.:  [id number]          PATIENT TYPE:  AMB   LOCATION:  DAY                          FACILITY:  [facility]   PHYSICIAN:  Almedia Balls. Fore, M.D.   DATE OF BIRTH:  12/07/1965   DATE OF ADMISSION:  09/06/2004  DATE OF DISCHARGE:                                HISTORY & PHYSICAL   CHIEF COMPLAINT:  Pain, fibroids, abnormal bleeding.   HISTORY:  The patient is a 45 year old, gravida 2, para 2, whose last  menstrual period was August 07, 2004.  She was initially seen in our  office in August of 2005 for evaluation of enlarged uterus and abnormal  bleeding.  She underwent a saline sonogram with findings of probable myomata  and an increased endometrial stripe.  She underwent hysterectomy D&C in  October of 2005 with findings of hyperplasia in the endometrium without  atypia.  She has been followed since that time for unusual bleeding.  She  has continued to have abnormal bleeding and pain.  The uterus under  examination was found to be 16-[redacted] weeks gestational size, and irregular with  probable myomata.  Because of the continued abnormal bleeding and pain and  enlarged uterus, she is admitted at this time for hysterectomy, possible  bilateral salpingo-oophorectomy.  Pap smear was normal in August of 2005.  She has been fully counseled as to the nature of the procedure and the risks  involved, to include risks of anesthesia, injury to bowel, bladder, blood  vessels, ureters, postoperative hemorrhage, infection, recuperation, and  possible hormone replacement should her ovaries be removed.  She fully  understands all these considerations and wishes to proceed on September 06, 2004.   PAST MEDICAL HISTORY:  1.  Tubal ligation in 1994.  2.  The above-noted hysteroscopy D&C in 2005.   MEDICATIONS:  Maxzide 50/75 daily for hypertension.   PRIMARY CARE PHYSICIAN:  She is followed by Dr. Wynelle Link with [facility].   ALLERGIES:  She is allergic to no medications.   SOCIAL HISTORY:  She is a nonsmoker and nondrinker.   FAMILY HISTORY:  Maternal aunts with uterine cancer.  An older sister who  underwent hysterectomy for myomata, as well.   REVIEW OF SYSTEMS:  HEENT:  Negative.  CARDIORESPIRATORY:  As noted above  with hypertension.  GASTROINTESTINAL:  Occasional indigestion problems.  GENITOURINARY:  As in present illness.  NEUROMUSCULAR:  Negative.   PHYSICAL EXAMINATION:  VITAL SIGNS:  Height 5 feet and 4-1/4 inches.  Weight  213 pounds.  Blood pressure has ranged from 132/80 to a high of 170/110.  She has been evaluated by Dr. Wynelle Link for this problem and found to have a  labile type hypertension, which generally is in a lower range with  medication.  Pulse 84, respirations 18.  GENERAL:  Well-developed black female in no acute distress.  HEENT:  Within normal limits.  NECK:  Supple without masses, adenopathy, or bruits.  HEART:  Regular rate and rhythm without murmurs.  LUNGS:  Clear to percussion and auscultation.  BREASTS:  Breasts  examined sitting and lying without mass.  ABDOMEN:  Flat and soft with mass effect above the symphysis pubis to a  level just below the umbilicus.  PELVIC:  Her external genital, Bartholin's urethra, Skene's glands within  normal limits.  Cervix slightly inflamed.  Uterus is mid position,  approximately 16-[redacted] weeks gestational size.  I cannot palpate adnexal  masses, but small cystic areas on the ovaries on ultrasound were noted.  Anterior and posterior cul-de-sac is confirmatory.  EXTREMITIES:  Within normal limits.  Central nervous system grossly intact.  SKIN:  Without suspicious lesions.   IMPRESSION:  Abnormal uterine bleeding.  Probable leiomyomata uteri.   DISPOSITION:  As noted above.      SRF/MEDQ  D:  08/30/2004  T:  08/30/2004  Job:  [id number]

## 2010-10-29 NOTE — Op Note (Signed)
Sarah [name], Sarah [name]        ACCOUNT NO.:  [id number]  MEDICAL RECORD NO.:  [id number]           PATIENT TYPE:  O  LOCATION:  SDSC                         FACILITY:  [facility]  PHYSICIAN:  Ollen Gross. Vernell Morgans, M.D. DATE OF BIRTH:  Nov 22, 1965  DATE OF PROCEDURE:  10/23/2010 DATE OF DISCHARGE:                              OPERATIVE REPORT   PREOPERATIVE DIAGNOSIS:  Large left thyroid nodule with compressive symptoms.  POSTOPERATIVE DIAGNOSIS:  Large left thyroid nodule with compressive symptoms.  PROCEDURE:  Left thyroid lobectomy with isthmus and pyramidal lobe.  SURGEON:  Ollen Gross. Vernell Morgans, M.D.  ASSISTANT:  Velora Heckler, MD  ANESTHESIA:  General endotracheal.  PROCEDURE IN DETAIL:  After informed consent was obtained, the patient was brought to the operating room, placed in the supine position on the operating room table.  After an induction of general anesthesia, a roll was placed behind the patient's shoulders to extend the neck slightly. The patient's neck and chest area were then prepped with ChloraPrep, allowed to dry, and then draped in usual sterile manner.  A low transverse Kocher incision was made along the neck with a 15 blade knife.  This incision was carried down through the skin and subcutaneous tissue sharply with the electrocautery.  The platysma was also divided with the electrocautery.  The platysma layer was grasped with Allis clamps and subplatysmal flaps were created superiorly and inferiorly by combination of blunt finger dissection and some sharp dissection with the electrocautery.  One vein had to be ligated with a 2-0 silk suture ligature.  Next, Mahorner retractor was deployed.  The dissection was then carried down along the midline of the neck until the trachea and isthmus of the thyroid gland were identified.  The strap muscles were then dissected off the anterior surface of the thyroid gland.  This was done by a combination of blunt dissection  with a peanut or some sharp dissection with the electrocautery.  Attention was first turned to the left thyroid lobe.  The strap muscles were retracted laterally with ArmyArtist.  The anterior surface of the thyroid gland was cleared by a blunt finger dissection.  Once we were able to identify the lateral edge of the thyroid gland, we were then able to start to gently roll it up anterior medial.  The dissection at this point was carried out with a baby right angle clamp.  Several small vessels were controlled with small clips and divided with the Harmonic scalpel.  Dissection was carried right along the capsule of the thyroid gland to try to avoid injury to the recurrent laryngeal nerve or parathyroid glands.  The superior pole also was dissected bluntly with right angle clamp.  The vessels were controlled with medium clips and then divided with the Harmonic scalpel.  Similar dissection was carried on the inferior pole with the vessels being controlled with a medium clip and divided with Harmonic scalpel.  Once this was accomplished, then we were able to get more mobilization of the thyroid gland and rotated further medially and anteriorly.  We did identify the parathyroid glands and we felt like the recurrent laryngeal nerve was actually  fairly far posterior to where we were dissecting.  Once we had the gland rolled up, we were then able to separate the gland from the anterior surface of the trachea sharply with the electrocautery.  The isthmus was also mobilized.  There was a large nodule in the isthmus of the thyroid gland.  The right lobe was palpated and felt fairly normal.  The pyramidal lobe just to the right of midline was dissected free sharply with the electrocautery.  We then chose a site where the isthmus of the thyroid gland joined the right lobe where the thyroid gland appeared to be normal and we divided the thyroid gland at this point with the Harmonic scalpel.   So, we took the large nodule on the isthmus with the specimen.  Once this specimen was then removed, it was oriented with a stitch on the left superior pole and sent to pathology for further evaluation.  The right thyroid lobe raw surface was controlled with a couple of 3-0 Vicryl U stitches and then the raw surface was also fulgurated with cautery.  The area was examined and found to be completely hemostatic.  The wound was irrigated with copious amounts of saline.  Some small pieces of Surgicel were placed in the operative bed on the left and again I believe that the recurrent laryngeal nerve was quite deep to where we were dissecting.  The strap muscles were then reapproximated along the midline with interrupted 3-0 Vicryl stitches.  The platysma layer was then also reapproximated with interrupted 3-0 Vicryl stitches and the skin was then closed with a running 4-0 Monocryl subcuticular stitch.  Dermabond dressing was applied.  The patient tolerated the procedure well.  At the end of the case all needle, sponge, and instrument counts were correct.  The patient was then awakened and taken to recovery room in stable condition.     Ollen Gross. Vernell Morgans, M.D.     PST/MEDQ  D:  10/23/2010  T:  10/23/2010  Job:  [id number]  Electronically Signed by Chevis Pretty III M.D. on 10/29/2010 01:57:04 PM

## 2010-11-02 ENCOUNTER — Encounter (INDEPENDENT_AMBULATORY_CARE_PROVIDER_SITE_OTHER): Payer: Self-pay | Admitting: General Surgery

## 2010-11-26 NOTE — Discharge Summary (Signed)
  NAMEFALAN, HENSLER NO.:  [id number]  MEDICAL RECORD NO.:  [id number]  LOCATION:  5157                         FACILITY:  [facility]  PHYSICIAN:  Ollen Gross. Vernell Morgans, M.D. DATE OF BIRTH:  September 18, 1965  DATE OF ADMISSION:  10/23/2010 DATE OF DISCHARGE:  10/24/2010                              DISCHARGE SUMMARY   [name] is a 45 year old female who underwent a left thyroid lobectomy.  She was kept overnight for observation.  She had baseline hypertension which was controlled with p.r.n. labetalol.  She did well overnight and had no issues.  Her voice was strong the next day and her incision looked good and she was discharged home on Oct 24, 2010.  MEDICATIONS:  She was to resume her home meds and she was given a prescription for pain medicine.  Her diet is as tolerated.  Activities as tolerated.  Follow up will be Dr. Carolynne Edouard in 2 weeks.  FINAL DIAGNOSIS:  Left thyroid nodule.  She is discharged home.     Ollen Gross. Vernell Morgans, M.D.     PST/MEDQ  D:  11/13/2010  T:  11/14/2010  Job:  [id number]  Electronically Signed by Chevis Pretty III M.D. on 11/26/2010 07:28:00 AM

## 2010-12-07 ENCOUNTER — Encounter (INDEPENDENT_AMBULATORY_CARE_PROVIDER_SITE_OTHER): Payer: Self-pay | Admitting: General Surgery

## 2010-12-10 ENCOUNTER — Ambulatory Visit (INDEPENDENT_AMBULATORY_CARE_PROVIDER_SITE_OTHER): Payer: 59 | Admitting: General Surgery

## 2010-12-10 VITALS — Temp 96.6°F | Wt 206.2 lb

## 2010-12-10 DIAGNOSIS — Z9889 Other specified postprocedural states: Secondary | ICD-10-CM

## 2010-12-10 DIAGNOSIS — E890 Postprocedural hypothyroidism: Secondary | ICD-10-CM

## 2010-12-10 DIAGNOSIS — E041 Nontoxic single thyroid nodule: Secondary | ICD-10-CM

## 2010-12-10 LAB — TSH: TSH: 1.867 u[IU]/mL (ref 0.350–4.500)

## 2010-12-10 LAB — T4, FREE: Free T4: 1.01 ng/dL (ref 0.80–1.80)

## 2010-12-10 NOTE — Patient Instructions (Signed)
We will call with lab results   

## 2010-12-10 NOTE — Progress Notes (Signed)
Subjective:     Patient ID: Sarah [name], female   DOB: 08-25-1965, 45 y.o.   MRN: [id number]    Temp(Src) 96.6 F (35.9 C) (Temporal)  Wt 206 lb 3.2 oz (93.532 kg)    HPI 45 yo bf who is about 6 weeks out from a left thyroid lobectomy for a large benign nodule causing compression. Her voice is strong. Swallowing has improved a lot. She only complains of not sleeping well and her hands and feet stay cold.  Review of Systems     Objective:   Physical Exam Neck is soft. Incision is healing nicely. Voice is strong    Assessment:     Post op    Plan:     Will obtain TSH and T4 levels today. Will call her with results. If normal will turn her care back over to her medical doctor. If not then may need to start her on thyroid replacement

## 2012-12-25 ENCOUNTER — Other Ambulatory Visit (HOSPITAL_COMMUNITY): Payer: Self-pay | Admitting: Internal Medicine

## 2012-12-25 DIAGNOSIS — Z1231 Encounter for screening mammogram for malignant neoplasm of breast: Secondary | ICD-10-CM

## 2012-12-29 ENCOUNTER — Ambulatory Visit (HOSPITAL_COMMUNITY)
Admission: RE | Admit: 2012-12-29 | Discharge: 2012-12-29 | Disposition: A | Discharge status: Home / Self Care | Payer: No Typology Code available for payment source | Source: Ambulatory Visit | Attending: Internal Medicine | Admitting: Internal Medicine

## 2012-12-29 DIAGNOSIS — Z1231 Encounter for screening mammogram for malignant neoplasm of breast: Secondary | ICD-10-CM | POA: Insufficient documentation

## 2014-06-28 ENCOUNTER — Encounter (HOSPITAL_COMMUNITY): Payer: Self-pay | Admitting: Emergency Medicine

## 2014-06-28 ENCOUNTER — Inpatient Hospital Stay (HOSPITAL_COMMUNITY)
Admission: EM | Admit: 2014-06-28 | Discharge: 2014-07-01 | DRG: 552 | Disposition: A | Discharge status: Home / Self Care | Payer: Medicaid Other | Attending: Internal Medicine | Admitting: Internal Medicine

## 2014-06-28 ENCOUNTER — Emergency Department (HOSPITAL_COMMUNITY): Payer: Medicaid Other

## 2014-06-28 DIAGNOSIS — E669 Obesity, unspecified: Secondary | ICD-10-CM | POA: Diagnosis present

## 2014-06-28 DIAGNOSIS — Z6837 Body mass index (BMI) 37.0-37.9, adult: Secondary | ICD-10-CM

## 2014-06-28 DIAGNOSIS — R590 Localized enlarged lymph nodes: Secondary | ICD-10-CM | POA: Diagnosis present

## 2014-06-28 DIAGNOSIS — Z90710 Acquired absence of both cervix and uterus: Secondary | ICD-10-CM

## 2014-06-28 DIAGNOSIS — M549 Dorsalgia, unspecified: Secondary | ICD-10-CM | POA: Diagnosis not present

## 2014-06-28 DIAGNOSIS — Z23 Encounter for immunization: Secondary | ICD-10-CM | POA: Diagnosis not present

## 2014-06-28 DIAGNOSIS — I10 Essential (primary) hypertension: Secondary | ICD-10-CM | POA: Diagnosis present

## 2014-06-28 DIAGNOSIS — J45909 Unspecified asthma, uncomplicated: Secondary | ICD-10-CM | POA: Diagnosis present

## 2014-06-28 DIAGNOSIS — Z8249 Family history of ischemic heart disease and other diseases of the circulatory system: Secondary | ICD-10-CM | POA: Diagnosis not present

## 2014-06-28 DIAGNOSIS — R938 Abnormal findings on diagnostic imaging of other specified body structures: Secondary | ICD-10-CM | POA: Diagnosis not present

## 2014-06-28 DIAGNOSIS — R591 Generalized enlarged lymph nodes: Secondary | ICD-10-CM | POA: Diagnosis not present

## 2014-06-28 DIAGNOSIS — R599 Enlarged lymph nodes, unspecified: Secondary | ICD-10-CM | POA: Diagnosis not present

## 2014-06-28 DIAGNOSIS — I160 Hypertensive urgency: Secondary | ICD-10-CM | POA: Diagnosis present

## 2014-06-28 DIAGNOSIS — N839 Noninflammatory disorder of ovary, fallopian tube and broad ligament, unspecified: Secondary | ICD-10-CM | POA: Diagnosis present

## 2014-06-28 DIAGNOSIS — N949 Unspecified condition associated with female genital organs and menstrual cycle: Secondary | ICD-10-CM | POA: Diagnosis not present

## 2014-06-28 DIAGNOSIS — C482 Malignant neoplasm of peritoneum, unspecified: Secondary | ICD-10-CM

## 2014-06-28 DIAGNOSIS — N8320 Unspecified ovarian cysts: Secondary | ICD-10-CM | POA: Diagnosis not present

## 2014-06-28 DIAGNOSIS — R102 Pelvic and perineal pain: Secondary | ICD-10-CM

## 2014-06-28 DIAGNOSIS — N9489 Other specified conditions associated with female genital organs and menstrual cycle: Secondary | ICD-10-CM | POA: Insufficient documentation

## 2014-06-28 DIAGNOSIS — E1159 Type 2 diabetes mellitus with other circulatory complications: Secondary | ICD-10-CM | POA: Insufficient documentation

## 2014-06-28 DIAGNOSIS — M545 Low back pain: Principal | ICD-10-CM | POA: Diagnosis present

## 2014-06-28 DIAGNOSIS — R911 Solitary pulmonary nodule: Secondary | ICD-10-CM | POA: Diagnosis present

## 2014-06-28 DIAGNOSIS — C569 Malignant neoplasm of unspecified ovary: Secondary | ICD-10-CM | POA: Diagnosis not present

## 2014-06-28 DIAGNOSIS — R971 Elevated cancer antigen 125 [CA 125]: Secondary | ICD-10-CM | POA: Diagnosis not present

## 2014-06-28 LAB — CBC WITH DIFFERENTIAL/PLATELET
Basophils Absolute: 0 10*3/uL (ref 0.0–0.1)
Basophils Relative: 0 % (ref 0–1)
Eosinophils Absolute: 0.2 10*3/uL (ref 0.0–0.7)
Eosinophils Relative: 2 % (ref 0–5)
HCT: 39.3 % (ref 36.0–46.0)
HEMOGLOBIN: 13.5 g/dL (ref 12.0–15.0)
LYMPHS ABS: 2.4 10*3/uL (ref 0.7–4.0)
LYMPHS PCT: 30 % (ref 12–46)
MCH: 30.6 pg (ref 26.0–34.0)
MCHC: 34.4 g/dL (ref 30.0–36.0)
MCV: 89.1 fL (ref 78.0–100.0)
Monocytes Absolute: 0.6 10*3/uL (ref 0.1–1.0)
Monocytes Relative: 7 % (ref 3–12)
Neutro Abs: 5 10*3/uL (ref 1.7–7.7)
Neutrophils Relative %: 61 % (ref 43–77)
Platelets: 339 10*3/uL (ref 150–400)
RBC: 4.41 MIL/uL (ref 3.87–5.11)
RDW: 14 % (ref 11.5–15.5)
WBC: 8.1 10*3/uL (ref 4.0–10.5)

## 2014-06-28 LAB — TROPONIN I: TROPONIN I: 0.03 ng/mL (ref <0.031)

## 2014-06-28 LAB — BASIC METABOLIC PANEL
ANION GAP: 8 (ref 5–15)
BUN: 13 mg/dL (ref 6–23)
CHLORIDE: 104 meq/L (ref 96–112)
CO2: 24 mmol/L (ref 19–32)
Calcium: 8.8 mg/dL (ref 8.4–10.5)
Creatinine, Ser: 0.95 mg/dL (ref 0.50–1.10)
GFR calc Af Amer: 81 mL/min — ABNORMAL LOW (ref >90)
GFR, EST NON AFRICAN AMERICAN: 70 mL/min — AB (ref >90)
GLUCOSE: 117 mg/dL — AB (ref 70–99)
POTASSIUM: 3.5 mmol/L (ref 3.5–5.1)
SODIUM: 136 mmol/L (ref 135–145)

## 2014-06-28 LAB — MRSA PCR SCREENING: MRSA by PCR: NEGATIVE

## 2014-06-28 MED ORDER — ONDANSETRON HCL 4 MG PO TABS: 4.0000 mg | ORAL_TABLET | Freq: Four times a day (QID) | ORAL | Status: DC | PRN

## 2014-06-28 MED ORDER — ONDANSETRON HCL 4 MG/2ML IJ SOLN
4.0000 mg | Freq: Once | INTRAMUSCULAR | Status: AC
  Administered 2014-06-28: 4 mg via INTRAVENOUS
  Filled 2014-06-28: qty 2

## 2014-06-28 MED ORDER — SODIUM CHLORIDE 0.9 % IJ SOLN
3.0000 mL | Freq: Two times a day (BID) | INTRAMUSCULAR | Status: DC
  Administered 2014-06-29 – 2014-07-01 (×5): 3 mL via INTRAVENOUS

## 2014-06-28 MED ORDER — NICARDIPINE HCL IN NACL 20-0.86 MG/200ML-% IV SOLN
5.0000 mg/h | INTRAVENOUS | Status: DC
  Administered 2014-06-28: 10 mg/h via INTRAVENOUS
  Administered 2014-06-28: 12.5 mg/h via INTRAVENOUS
  Administered 2014-06-29 (×2): 5 mg/h via INTRAVENOUS
  Filled 2014-06-28 (×4): qty 200

## 2014-06-28 MED ORDER — MORPHINE SULFATE 4 MG/ML IJ SOLN
4.0000 mg | Freq: Once | INTRAMUSCULAR | Status: AC
  Administered 2014-06-28: 4 mg via INTRAVENOUS
  Filled 2014-06-28: qty 1

## 2014-06-28 MED ORDER — LABETALOL HCL 5 MG/ML IV SOLN
20.0000 mg | Freq: Once | INTRAVENOUS | Status: AC
  Administered 2014-06-28: 20 mg via INTRAVENOUS
  Filled 2014-06-28: qty 4

## 2014-06-28 MED ORDER — SODIUM CHLORIDE 0.9 % IV SOLN
INTRAVENOUS | Status: DC
  Administered 2014-06-28 – 2014-06-30 (×2): via INTRAVENOUS

## 2014-06-28 MED ORDER — SODIUM CHLORIDE 0.9 % IV BOLUS (SEPSIS)
500.0000 mL | Freq: Once | INTRAVENOUS | Status: AC
  Administered 2014-06-28: 500 mL via INTRAVENOUS

## 2014-06-28 MED ORDER — ONDANSETRON HCL 4 MG/2ML IJ SOLN
4.0000 mg | Freq: Four times a day (QID) | INTRAMUSCULAR | Status: DC | PRN
  Administered 2014-06-28 – 2014-06-30 (×2): 4 mg via INTRAVENOUS
  Filled 2014-06-28 (×2): qty 2

## 2014-06-28 MED ORDER — INFLUENZA VAC SPLIT QUAD 0.5 ML IM SUSY
0.5000 mL | PREFILLED_SYRINGE | INTRAMUSCULAR | Status: AC
  Administered 2014-06-29: 0.5 mL via INTRAMUSCULAR
  Filled 2014-06-28 (×3): qty 0.5

## 2014-06-28 MED ORDER — ACETAMINOPHEN 650 MG RE SUPP: 650.0000 mg | Freq: Four times a day (QID) | RECTAL | Status: DC | PRN

## 2014-06-28 MED ORDER — MORPHINE SULFATE 4 MG/ML IJ SOLN
6.0000 mg | Freq: Once | INTRAMUSCULAR | Status: AC
  Administered 2014-06-28: 6 mg via INTRAVENOUS
  Filled 2014-06-28: qty 2

## 2014-06-28 MED ORDER — ACETAMINOPHEN 325 MG PO TABS
650.0000 mg | ORAL_TABLET | Freq: Four times a day (QID) | ORAL | Status: DC | PRN
  Administered 2014-06-28 – 2014-06-29 (×3): 650 mg via ORAL
  Filled 2014-06-28 (×3): qty 2

## 2014-06-28 MED ORDER — CETYLPYRIDINIUM CHLORIDE 0.05 % MT LIQD
7.0000 mL | Freq: Two times a day (BID) | OROMUCOSAL | Status: DC
  Administered 2014-06-29 – 2014-07-01 (×5): 7 mL via OROMUCOSAL

## 2014-06-28 MED ORDER — NICARDIPINE HCL IN NACL 20-0.86 MG/200ML-% IV SOLN
5.0000 mg/h | Freq: Once | INTRAVENOUS | Status: AC
  Administered 2014-06-28: 5 mg/h via INTRAVENOUS
  Filled 2014-06-28: qty 200

## 2014-06-28 MED ORDER — KETOROLAC TROMETHAMINE 15 MG/ML IJ SOLN
15.0000 mg | Freq: Once | INTRAMUSCULAR | Status: AC
  Administered 2014-06-28: 15 mg via INTRAVENOUS
  Filled 2014-06-28: qty 1

## 2014-06-28 MED ORDER — IOHEXOL 350 MG/ML SOLN
100.0000 mL | Freq: Once | INTRAVENOUS | Status: AC | PRN
  Administered 2014-06-28: 100 mL via INTRAVENOUS

## 2014-06-28 MED ORDER — PNEUMOCOCCAL VAC POLYVALENT 25 MCG/0.5ML IJ INJ
0.5000 mL | INJECTION | INTRAMUSCULAR | Status: AC
  Administered 2014-06-29: 0.5 mL via INTRAMUSCULAR
  Filled 2014-06-28 (×3): qty 0.5

## 2014-06-28 NOTE — Progress Notes (Signed)
Utilization Review completed.  Shonica Weier RN CM  

## 2014-06-28 NOTE — ED Notes (Signed)
Called [facility] in attempt to give report, nurse states unable to take report at this time, will call back shortly

## 2014-06-28 NOTE — Progress Notes (Signed)
  CARE MANAGEMENT ED NOTE 06/28/2014  Patient:  Sarah [name], Sarah [name]   Account Number:  [id number]  Date Initiated:  06/28/2014  Documentation initiated by:  Livia Snellen  Subjective/Objective Assessment:   Patient presents to ED with lower back pain for 2 weeks and HTN     Subjective/Objective Assessment Detail:   Patient with pmhx of HTN.  B/P  in ED as high as 268/148. patient placed on Cardene drip.     Action/Plan:   Action/Plan Detail:   Anticipated DC Date:       Status Recommendation to Physician:   Result of Recommendation:    Other ED [place]  Other  PCP issues  Medication Assistance    Choice offered to / List presented to:            Status of service:  Completed, signed off  ED Comments:   ED Comments Detail:  EDCM spoke to patient and spouse at bedside.  Patient very drowsy, mainly spoke to patient's spouse.  Per spouse, "Ever since she lost her job, she has not taken any of her medications and has not seen a doctor because we can't affod it."  Patient is without insurnace.  Patient is MATCH eligible.  Patient's spouse unable to tell [facility] what medications patient has taken in the past for her blood pressure.  EDCM provided patient's spouse with pamphlet to [facility], informed patient of services there and walk in times. EDCM also provided patient with list of pcps who accept self pay patients, list of discount pharmacies and websites needymeds.org and GoodRX.com for medication assistance, phone number to inquire about the orange card, phone number to inquire about [employer], phone number to inquire about the [place], financial resources in the community such as local churches, [employer], urban ministries, and dental assistance for uninsured patients. Patient's spouse thankfulf or resources.  No further EDCM needs at this time.

## 2014-06-28 NOTE — ED Notes (Signed)
Unable to get in touch with nurse from [facility] to give report

## 2014-06-28 NOTE — ED Notes (Signed)
Pt c/o low back pain x 2 weeks. Denies bowel or bladder incontinence. Pt ambulatory in triage.

## 2014-06-28 NOTE — ED Provider Notes (Signed)
CSN: [id number]     Arrival date & time 06/28/14  5573 History   First MD Initiated Contact with Patient 06/28/14 1031     Chief Complaint  Patient presents with  . Back Pain     (Consider location/radiation/quality/duration/timing/severity/associated sxs/prior Treatment) HPI Comments: 49 year old female with history of high blood pressure currently not taking medicines for the past year due to financial reasons, asthma presents with lower back pain worsening for the past 10 days. Patient denies urinary or bowel changes, active cancer,  Focal extremity weakness, IVDU, fevers, immunosuppression or significant trauma. Mild radiation down the back of both thighs to the knee. Patient thought initially similar previous normal back pain however gradually worsening and persistent. Sharp sensation.  Patient has not seen a primary doctor due to financial reasons unsure normal blood pressure.   Patient is a 49 y.o. female presenting with back pain. The history is provided by the patient.  Back Pain Associated symptoms: no abdominal pain, no chest pain, no dysuria, no fever, no headaches (not currently), no numbness and no weakness     Past Medical History  Diagnosis Date  . Asthma   . Hypertension   . Glucagonoma    Past Surgical History  Procedure Laterality Date  . Abdominal hysterectomy    . Thyroidectomy, partial     Family History  Problem Relation Age of Onset  . Hypertension Mother   . Hypertension Father   . Diabetes Father    History  Substance Use Topics  . Smoking status: Never Smoker   . Smokeless tobacco: Never Used  . Alcohol Use: No   OB History    No data available     Review of Systems  Constitutional: Negative for fever and chills.  HENT: Negative for congestion.   Eyes: Negative for visual disturbance.  Respiratory: Positive for shortness of breath (mild similar to the past few months intermittent.).   Cardiovascular: Negative for chest pain.   Gastrointestinal: Negative for vomiting and abdominal pain.  Genitourinary: Negative for dysuria and flank pain.  Musculoskeletal: Positive for back pain. Negative for neck pain and neck stiffness.  Skin: Negative for rash.  Neurological: Negative for weakness, light-headedness, numbness and headaches (not currently).      Allergies  Review of patient's allergies indicates no known allergies.  Home Medications   Prior to Admission medications   Medication Sig Start Date End Date Taking? Authorizing Provider  acetaminophen (TYLENOL) 500 MG tablet Take 1,000 mg by mouth every 6 (six) hours as needed for mild pain or headache.   Yes Historical Provider, MD   BP 170/87 mmHg  Pulse 87  Temp(Src) 97.9 F (36.6 C) (Oral)  Resp 24  SpO2 100% Physical Exam  Constitutional: She is oriented to person, place, and time. She appears well-developed and well-nourished.  HENT:  Head: Normocephalic and atraumatic.  Eyes: Conjunctivae are normal. Right eye exhibits no discharge. Left eye exhibits no discharge.  Neck: Normal range of motion. Neck supple. No tracheal deviation present.  Cardiovascular: Normal rate, regular rhythm and intact distal pulses.   Pulmonary/Chest: Effort normal and breath sounds normal.  Abdominal: Soft. She exhibits no distension. There is no tenderness. There is no guarding.  Musculoskeletal: She exhibits tenderness (midline lower lumbar mild paraspinal tenderness). She exhibits no edema.  Neurological: She is alert and oriented to person, place, and time. GCS eye subscore is 4. GCS verbal subscore is 5. GCS motor subscore is 6.  Reflex Scores:      Patellar  reflexes are 2+ on the right side and 2+ on the left side.      Achilles reflexes are 2+ on the right side and 2+ on the left side. 5+ strength in UE and LE with f/e at major joints. Sensation to palpation intact in UE and LE. CNs 2-12 grossly intact.  EOMFI.  PERRL.   Finger nose and coordination intact  bilateral.   Visual fields intact to finger testing. Pain with straight leg of right lower extremity.  Skin: Skin is warm. No rash noted.  Psychiatric: She has a normal mood and affect.  Nursing note and vitals reviewed.   ED Course  Procedures (including critical care time) CRITICAL CARE Performed by: Mariea Clonts   Total critical care time: 35 min  Critical care time was exclusive of separately billable procedures and treating other patients.  Critical care was necessary to treat or prevent imminent or life-threatening deterioration.  Critical care was time spent personally by me on the following activities: development of treatment plan with patient and/or surrogate as well as nursing, discussions with consultants, evaluation of patient's response to treatment, examination of patient, obtaining history from patient or surrogate, ordering and performing treatments and interventions, ordering and review of laboratory studies, ordering and review of radiographic studies, pulse oximetry and re-evaluation of patient's condition.  Labs Review Labs Reviewed  BASIC METABOLIC PANEL - Abnormal; Notable for the following:    Glucose, Bld 117 (*)    GFR calc non Af Amer 70 (*)    GFR calc Af Amer 81 (*)    All other components within normal limits  CBC WITH DIFFERENTIAL  TROPONIN I  COMPREHENSIVE METABOLIC PANEL  CBC  PROTIME-INR    Imaging Review Ct Angio Chest Aorta W/cm &/or Wo/cm  06/28/2014   CLINICAL DATA:  Low back pain for 2 weeks. Short of breath. Evaluate for dissection.  EXAM: CT ANGIOGRAPHY CHEST, ABDOMEN AND PELVIS  TECHNIQUE: Multidetector CT imaging through the chest, abdomen and pelvis was performed using the standard protocol during bolus administration of intravenous contrast. Multiplanar reconstructed images and MIPs were obtained and reviewed to evaluate the vascular anatomy.  CONTRAST:  144mL OMNIPAQUE IOHEXOL 350 MG/ML SOLN  COMPARISON:  None.  FINDINGS: CTA  CHEST FINDINGS  There is no evidence of aortic dissection or intramural hematoma. There is no obvious filling defect in the pulmonary arterial tree.  There is circumferential wall thickening of the innominate artery resulting in mild narrowing. Little if any irregular plaque or atherosclerotic calcification is present in the thorax. Postoperative changes from left hemithyroidectomy are noted.  Several scattered mediastinal nodes are present. 1.6 cm short axis diameter paratracheal node on image 30. 1.1 cm right hilar node on image 47. 9 mm left hilar lymph node on image 45.  No pneumothorax.  No pleural effusion.  Dependent atelectasis bilaterally with low volumes. 3 mm left lower lobe pulmonary nodule on image 43.  Review of the MIP images confirms the above findings.  CTA ABDOMEN AND PELVIS FINDINGS  There is no evidence of aortic aneurysm or aortic dissection.  Celiac is patent. Branch vessels are patent. There is mild narrowing at the origin of the SMA. Branch vessels are grossly patent.  IMA is diminutive and patent.  Branch vessels are grossly patent.  Single renal arteries are patent.  Bilateral common, internal, and external iliac arteries are patent. Mild atherosclerotic changes with calcification in the bilateral internal iliac arteries.  Liver, gallbladder, spleen, pancreas, adrenal glands, and kidneys are  within normal limits.  No abnormal retroperitoneal adenopathy.  There is an indeterminate lesion in the right adnexa measuring 4.4 cm with elevated Hounsfield unit measurements. Left adnexa is unremarkable other than calcifications.  There is an unusual stranding within the peritoneal fat of the omentum hand in the anterior lower abdomen and pelvis. Within the regions of stranding, there is hyperdense linear areas most likely calcification. There is a small amount of free fluid in the pelvis with the dependent layering calcification.  Bladder is within normal limits.  Uterus is absent.  Appendix is  within normal limits.  Review of the MIP images confirms the above findings.  IMPRESSION: There is no evidence of thoracic or abdominal aortic aneurysm.  There is noted to be circumferential narrowing of the innominate artery but this narrowing is not significant. It is associated with significant wall thickening. This may simply be due to atherosclerosis, however vasculitis is in the differential diagnosis including mycotic disorder.  There is mild abnormal mediastinal adenopathy and hilar adenopathy. Inflammatory and malignant etiology are not excluded such as metastatic disease, mycobacterium infection, lymphoma, sarcoidosis.  3 mm left lower lobe pulmonary nodule. If the patient is at high risk for bronchogenic carcinoma, follow-up chest CT at 1 year is recommended. If the patient is at low risk, no follow-up is needed. This recommendation follows the consensus statement: Guidelines for Management of Small Pulmonary Nodules Detected on CT Scans: A Statement from the [employer] as published in Radiology 2005; 237:395-400.  Within the abdomen, there is stranding and calcification in the omental fat worrisome for peritoneal carcinomatosis. There is a small amount of free fluid with dependent calcification.  Indeterminate 4.4 cm right adnexal lesion. Pelvic ultrasound is recommended to further characterize. The uterus is notably absent.   Electronically Signed   By: Maryclare Bean M.D.   On: 06/28/2014 13:19   Ct Cta Abd/pel W/cm &/or W/o Cm  06/28/2014   CLINICAL DATA:  Low back pain for 2 weeks. Short of breath. Evaluate for dissection.  EXAM: CT ANGIOGRAPHY CHEST, ABDOMEN AND PELVIS  TECHNIQUE: Multidetector CT imaging through the chest, abdomen and pelvis was performed using the standard protocol during bolus administration of intravenous contrast. Multiplanar reconstructed images and MIPs were obtained and reviewed to evaluate the vascular anatomy.  CONTRAST:  134mL OMNIPAQUE IOHEXOL 350 MG/ML SOLN   COMPARISON:  None.  FINDINGS: CTA CHEST FINDINGS  There is no evidence of aortic dissection or intramural hematoma. There is no obvious filling defect in the pulmonary arterial tree.  There is circumferential wall thickening of the innominate artery resulting in mild narrowing. Little if any irregular plaque or atherosclerotic calcification is present in the thorax. Postoperative changes from left hemithyroidectomy are noted.  Several scattered mediastinal nodes are present. 1.6 cm short axis diameter paratracheal node on image 30. 1.1 cm right hilar node on image 47. 9 mm left hilar lymph node on image 45.  No pneumothorax.  No pleural effusion.  Dependent atelectasis bilaterally with low volumes. 3 mm left lower lobe pulmonary nodule on image 43.  Review of the MIP images confirms the above findings.  CTA ABDOMEN AND PELVIS FINDINGS  There is no evidence of aortic aneurysm or aortic dissection.  Celiac is patent. Branch vessels are patent. There is mild narrowing at the origin of the SMA. Branch vessels are grossly patent.  IMA is diminutive and patent.  Branch vessels are grossly patent.  Single renal arteries are patent.  Bilateral common, internal, and external iliac arteries  are patent. Mild atherosclerotic changes with calcification in the bilateral internal iliac arteries.  Liver, gallbladder, spleen, pancreas, adrenal glands, and kidneys are within normal limits.  No abnormal retroperitoneal adenopathy.  There is an indeterminate lesion in the right adnexa measuring 4.4 cm with elevated Hounsfield unit measurements. Left adnexa is unremarkable other than calcifications.  There is an unusual stranding within the peritoneal fat of the omentum hand in the anterior lower abdomen and pelvis. Within the regions of stranding, there is hyperdense linear areas most likely calcification. There is a small amount of free fluid in the pelvis with the dependent layering calcification.  Bladder is within normal limits.   Uterus is absent.  Appendix is within normal limits.  Review of the MIP images confirms the above findings.  IMPRESSION: There is no evidence of thoracic or abdominal aortic aneurysm.  There is noted to be circumferential narrowing of the innominate artery but this narrowing is not significant. It is associated with significant wall thickening. This may simply be due to atherosclerosis, however vasculitis is in the differential diagnosis including mycotic disorder.  There is mild abnormal mediastinal adenopathy and hilar adenopathy. Inflammatory and malignant etiology are not excluded such as metastatic disease, mycobacterium infection, lymphoma, sarcoidosis.  3 mm left lower lobe pulmonary nodule. If the patient is at high risk for bronchogenic carcinoma, follow-up chest CT at 1 year is recommended. If the patient is at low risk, no follow-up is needed. This recommendation follows the consensus statement: Guidelines for Management of Small Pulmonary Nodules Detected on CT Scans: A Statement from the [place] as published in Radiology 2005; 237:395-400.  Within the abdomen, there is stranding and calcification in the omental fat worrisome for peritoneal carcinomatosis. There is a small amount of free fluid with dependent calcification.  Indeterminate 4.4 cm right adnexal lesion. Pelvic ultrasound is recommended to further characterize. The uterus is notably absent.   Electronically Signed   By: Maryclare Bean M.D.   On: 06/28/2014 13:19     EKG Interpretation   Date/Time:  Tuesday June 28 2014 11:36:09 EST Ventricular Rate:  95 PR Interval:  165 QRS Duration: 76 QT Interval:  381 QTC Calculation: 479 R Axis:   62 Text Interpretation:  Sinus rhythm Anterior infarct, old Confirmed by  Korena Nass  MD, Rithika Seel ([id number]) on 06/28/2014 11:39:32 AM      MDM   Final diagnoses:  Acute back pain  Pulmonary nodule  Adnexal mass  Essential hypertension  Hilar adenopathy   Patient with worsening lower  back pain insignificant high blood pressure on arrival. With sharp sensation midline and significant elevated blood pressure with noncompliance with medicines of last year CT scan ordered to look for signs of dissection. Pain medicines ordered in the ER. Blood pressure meds ordered.  Patient initially improved on reassessment however pain worsening blood pressure worsening. Patient acquired labetalol bolus twice IV. Discussed multiple abnormalities on CT scan, patient will need close follow-up outpatient for these, patient understands. On multiple reassessment patient blood pressure worsening to 631 systolic. No chest pain or shortness of breath. Discussed this with [facility] hospitalist for stepdown admission. Cardene drip started, titrated to improve blood pressure.  The patients results and plan were reviewed and discussed.   Any x-rays performed were personally reviewed by myself.   Differential diagnosis were considered with the presenting HPI.  Medications  sodium chloride 0.9 % injection 3 mL (not administered)  0.9 %  sodium chloride infusion (not administered)  acetaminophen (TYLENOL) tablet 650 mg (  not administered)    Or  acetaminophen (TYLENOL) suppository 650 mg (not administered)  ondansetron (ZOFRAN) tablet 4 mg (not administered)    Or  ondansetron (ZOFRAN) injection 4 mg (not administered)  pneumococcal 23 valent vaccine (PNU-IMMUNE) injection 0.5 mL (not administered)  Influenza vac split quadrivalent PF (FLUARIX) injection 0.5 mL (not administered)  nicardipine (CARDENE) 20mg  in 0.86% saline 27ml IV infusion (0.1 mg/ml) (not administered)  morphine 4 MG/ML injection 6 mg (6 mg Intravenous Given 06/28/14 1127)  sodium chloride 0.9 % bolus 500 mL (0 mLs Intravenous Stopped 06/28/14 1252)  labetalol (NORMODYNE,TRANDATE) injection 20 mg (20 mg Intravenous Given 06/28/14 1255)  iohexol (OMNIPAQUE) 350 MG/ML injection 100 mL (100 mLs Intravenous Contrast Given 06/28/14 1233)   labetalol (NORMODYNE,TRANDATE) injection 20 mg (20 mg Intravenous Given 06/28/14 1332)  ketorolac (TORADOL) 15 MG/ML injection 15 mg (15 mg Intravenous Given 06/28/14 1428)  morphine 4 MG/ML injection 4 mg (4 mg Intravenous Given 06/28/14 1429)  nicardipine (CARDENE) 20mg  in 0.86% saline 251ml IV infusion (0.1 mg/ml) (7.5 mg/hr Intravenous Rate/Dose Change 06/28/14 1645)  ondansetron (ZOFRAN) injection 4 mg (4 mg Intravenous Given 06/28/14 1559)    Filed Vitals:   06/28/14 1552 06/28/14 1600 06/28/14 1615 06/28/14 1643  BP: 219/117 183/100 162/90 170/87  Pulse: 92 85 85 87  Temp:      TempSrc:      Resp: 22 20 14 24   SpO2: 100% 100% 100% 100%    Final diagnoses:  Acute back pain  Pulmonary nodule  Adnexal mass  Essential hypertension  Hilar adenopathy    Admission/ observation were discussed with the admitting physician, patient and/or family and they are comfortable with the plan.      Mariea Clonts, MD 06/28/14 [phone]

## 2014-06-28 NOTE — ED Notes (Signed)
Pt resting in bed O2 sats 89% RA, Pt placed on 2L via [place].

## 2014-06-28 NOTE — H&P (Signed)
[facility] History and Physical  Shadiamond Koska FAO:[id number] DOB: 02-24-1966 DOA: 06/28/2014  Referring physician: ER physician PCP: No PCP Per Patient   Chief Complaint: back pain  HPI:  49 year old female with history of hypertension who presented to [facility] ED with ongoing back pain for past week or so. Pain is occasionally sharp and radiates down to legs. No difficulty ambulating just pain which usually is 5-6/10 in intensity and not relieved with rest. It is little better with pain meds given in ED. No loss of sensation and loss of strength in legs. No falls. No other complaints of nausea, vomiting or abdominal pain. No chest pain, shortness of breath or palpitations. No fevers or cough.  In ED, pt was found to have BP of 268/148 which improved to 184/102 with labetalol 20 mg IV once and Cardene drip. Her blood work was essentially unremarkable. CT chest and abdomen showed mild abnormal mediastinal adenopathy and hilar adenopathy. Inflammatory and malignant etiology are not excluded such as metastatic disease, mycobacterium infection, lymphoma, sarcoidosis.  There is a 3 mm left lower lobe pulmonary nodule and recommendations is to repeat CT scan in 1 year if pt at risk of bronchogenic carcinoma. She is not a smoker and denies second hand exposure to smoking. In regards to abdominal imaging studies within the abdomen, there is stranding and calcification in the omental fat worrisome for peritoneal carcinomatosis and  indeterminate 4.4 cm right adnexal lesion. Pelvic ultrasound is recommended to further characterize. The uterus is notably absent.   She was admitted to SDU due to hypertensive urgency and requirement for Cardene drip.   Assessment & Plan    Active Problems:   Accelerated hypertension / hypertensive urgency - BP on admission 268/148 and with labetalol 20 mg IV and Cardene drip BP improved to 184/102. - pt admitted to SDU since she still requires Cardene drip - renal  function is WNL    Pulmonary nodule / hilar and mediastinal lymphadenopathy / concern for peritoneal carcinomatosis - suspicious for malignancy - will consult oncology tomorrow am and ask for input; based on CT findings, repeat CT scan is warranted if pt high risk for bronchogenic carcinoma  - order placed for pelvic ultrasound    DVT prophylaxis:  - SCD's bilaterally   Radiological Exams on Admission: Ct Angio Chest Aorta W/cm &/or Wo/cm 06/28/2014   There is no evidence of thoracic or abdominal aortic aneurysm.  There is noted to be circumferential narrowing of the innominate artery but this narrowing is not significant. It is associated with significant wall thickening. This may simply be due to atherosclerosis, however vasculitis is in the differential diagnosis including mycotic disorder.  There is mild abnormal mediastinal adenopathy and hilar adenopathy. Inflammatory and malignant etiology are not excluded such as metastatic disease, mycobacterium infection, lymphoma, sarcoidosis.  3 mm left lower lobe pulmonary nodule. If the patient is at high risk for bronchogenic carcinoma, follow-up chest CT at 1 year is recommended. If the patient is at low risk, no follow-up is needed.   Ct Cta Abd/pel W/cm &/or W/o Cm 06/28/2014    There is no evidence of thoracic or abdominal aortic aneurysm.  There is noted to be circumferential narrowing of the innominate artery but this narrowing is not significant. It is associated with significant wall thickening. This may simply be due to atherosclerosis, however vasculitis is in the differential diagnosis including mycotic disorder.  There is mild abnormal mediastinal adenopathy and hilar adenopathy. Inflammatory and malignant etiology are not excluded  such as metastatic disease, mycobacterium infection, lymphoma, sarcoidosis.  3 mm left lower lobe pulmonary nodule. If the patient is at high risk for bronchogenic carcinoma, follow-up chest CT at 1 year is  recommended. If the patient is at low risk, no follow-up is needed. This recommendation follows the consensus statement: Guidelines for Management of Small Pulmonary Nodules Detected on CT Scans: A Statement from the [employer] as published in Radiology 2005; 237:395-400.  Within the abdomen, there is stranding and calcification in the omental fat worrisome for peritoneal carcinomatosis. There is a small amount of free fluid with dependent calcification.  Indeterminate 4.4 cm right adnexal lesion. Pelvic ultrasound is recommended to further characterize. The uterus is notably absent.   Electronically Signed   By: Maryclare Bean M.D.   On: 06/28/2014 13:19    EKG: sinus rhythm  Code Status: Full Family Communication: Plan of care discussed with the patient  Disposition Plan: Admit for further evaluation; stepdown unit due to hypertensive urgency and requirement for Cardene drip   Leisa Lenz, MD  [facility] Pager [phone]  Review of Systems:  Constitutional: Negative for fever, chills and malaise/fatigue. Negative for diaphoresis.  HENT: Negative for hearing loss, ear pain, nosebleeds, congestion, sore throat, neck pain, tinnitus and ear discharge.   Eyes: Negative for blurred vision, double vision, photophobia, pain, discharge and redness.  Respiratory: Negative for cough, hemoptysis, sputum production, shortness of breath, wheezing and stridor.   Cardiovascular: Negative for chest pain, palpitations, orthopnea, claudication and leg swelling.  Gastrointestinal: Negative for nausea, vomiting and abdominal pain. Negative for heartburn, constipation, blood in stool and melena.  Genitourinary: Negative for dysuria, urgency, frequency, hematuria and flank pain.  Musculoskeletal: Negative for myalgias and falls.  Skin: Negative for itching and rash.  Neurological: Negative for dizziness and weakness. Negative for tingling, tremors, sensory change, speech change, focal weakness, loss of  consciousness and headaches.  Endo/Heme/Allergies: Negative for environmental allergies and polydipsia. Does not bruise/bleed easily.  Psychiatric/Behavioral: Negative for suicidal ideas. The patient is not nervous/anxious.      Past Medical History  Diagnosis Date  . Asthma   . Hypertension   . Glucagonoma    Past Surgical History  Procedure Laterality Date  . Abdominal hysterectomy     Social History:  reports that she has never smoked. She does not have any smokeless tobacco history on file. She reports that she does not drink alcohol. Her drug history is not on file.  No Known Allergies  Family History:  Family History  Problem Relation Age of Onset  . Hypertension Mother   . Hypertension Father   . Diabetes Father      Prior to Admission medications   Medication Sig Start Date End Date Taking? Authorizing Provider  acetaminophen (TYLENOL) 500 MG tablet Take 1,000 mg by mouth every 6 (six) hours as needed for mild pain or headache.   Yes Historical Provider, MD   Physical Exam: Filed Vitals:   06/28/14 1318 06/28/14 1338 06/28/14 1350 06/28/14 1510  BP: 216/123 188/109 181/87 209/116  Pulse:  76 77 77  Temp:      TempSrc:      Resp:  22 20 23   SpO2:  97% 99% 90%    Physical Exam  Constitutional: Appears well-developed and well-nourished. No distress.  HENT: Normocephalic. No tonsillar erythema or exudates Eyes: Conjunctivae and EOM are normal. PERRLA, no scleral icterus.  Neck: Normal ROM. Neck supple. No JVD. No tracheal deviation. No thyromegaly.  CVS: RRR, S1/S2 +,  no murmurs, no gallops, no carotid bruit.  Pulmonary: Effort and breath sounds normal, no stridor, rhonchi, wheezes, rales.  Abdominal: Soft. BS +,  no distension, tenderness, rebound or guarding.  Musculoskeletal: Normal range of motion. No edema and no tenderness.  Lymphadenopathy: No lymphadenopathy noted, cervical, inguinal. Neuro: Alert. Normal reflexes, muscle tone coordination. No focal  neurologic deficits. Skin: Skin is warm and dry. No rash noted.  No erythema. No pallor.  Psychiatric: Normal mood and affect. Behavior, judgment, thought content normal.   Labs on Admission:  Basic Metabolic Panel:  Recent Labs Lab 06/28/14 1114  NA 136  K 3.5  CL 104  CO2 24  GLUCOSE 117*  BUN 13  CREATININE 0.95  CALCIUM 8.8   Liver Function Tests: No results for input(s): AST, ALT, ALKPHOS, BILITOT, PROT, ALBUMIN in the last 168 hours. No results for input(s): LIPASE, AMYLASE in the last 168 hours. No results for input(s): AMMONIA in the last 168 hours. CBC:  Recent Labs Lab 06/28/14 1114  WBC 8.1  NEUTROABS 5.0  HGB 13.5  HCT 39.3  MCV 89.1  PLT 339   Cardiac Enzymes:  Recent Labs Lab 06/28/14 1114  TROPONINI 0.03   BNP: Invalid input(s): POCBNP CBG: No results for input(s): GLUCAP in the last 168 hours.  If 7PM-7AM, please contact night-coverage [email or web address] Password [email or web address] 06/28/2014, 3:26 PM

## 2014-06-28 NOTE — ED Notes (Signed)
Pt called out with the complaint of vomiting. When entered room pt covered in emesis. As well as most of the bed and the floor surrounding. Pt states she had no forewarning to episode.

## 2014-06-29 ENCOUNTER — Ambulatory Visit (HOSPITAL_COMMUNITY): Payer: Medicaid Other

## 2014-06-29 ENCOUNTER — Inpatient Hospital Stay (HOSPITAL_COMMUNITY): Payer: Medicaid Other

## 2014-06-29 DIAGNOSIS — R591 Generalized enlarged lymph nodes: Secondary | ICD-10-CM

## 2014-06-29 LAB — PROTIME-INR
INR: 1.06 (ref 0.00–1.49)
PROTHROMBIN TIME: 13.9 s (ref 11.6–15.2)

## 2014-06-29 LAB — COMPREHENSIVE METABOLIC PANEL
ALT: 15 U/L (ref 0–35)
AST: 29 U/L (ref 0–37)
Albumin: 3.6 g/dL (ref 3.5–5.2)
Alkaline Phosphatase: 50 U/L (ref 39–117)
Anion gap: 8 (ref 5–15)
BILIRUBIN TOTAL: 1.1 mg/dL (ref 0.3–1.2)
BUN: 11 mg/dL (ref 6–23)
CALCIUM: 8.3 mg/dL — AB (ref 8.4–10.5)
CO2: 26 mmol/L (ref 19–32)
Chloride: 104 mEq/L (ref 96–112)
Creatinine, Ser: 0.92 mg/dL (ref 0.50–1.10)
GFR calc Af Amer: 84 mL/min — ABNORMAL LOW (ref >90)
GFR, EST NON AFRICAN AMERICAN: 72 mL/min — AB (ref >90)
GLUCOSE: 126 mg/dL — AB (ref 70–99)
Potassium: 3.2 mmol/L — ABNORMAL LOW (ref 3.5–5.1)
SODIUM: 138 mmol/L (ref 135–145)
Total Protein: 7.9 g/dL (ref 6.0–8.3)

## 2014-06-29 LAB — GLUCOSE, CAPILLARY: Glucose-Capillary: 101 mg/dL — ABNORMAL HIGH (ref 70–99)

## 2014-06-29 LAB — CBC
HCT: 38 % (ref 36.0–46.0)
Hemoglobin: 12.5 g/dL (ref 12.0–15.0)
MCH: 29.8 pg (ref 26.0–34.0)
MCHC: 32.9 g/dL (ref 30.0–36.0)
MCV: 90.7 fL (ref 78.0–100.0)
PLATELETS: 327 10*3/uL (ref 150–400)
RBC: 4.19 MIL/uL (ref 3.87–5.11)
RDW: 14.1 % (ref 11.5–15.5)
WBC: 9.3 10*3/uL (ref 4.0–10.5)

## 2014-06-29 MED ORDER — HYDROCOD POLST-CHLORPHEN POLST 10-8 MG/5ML PO LQCR
5.0000 mL | Freq: Once | ORAL | Status: AC
  Administered 2014-06-29: 5 mL via ORAL
  Filled 2014-06-29: qty 5

## 2014-06-29 MED ORDER — AMLODIPINE BESYLATE 10 MG PO TABS
10.0000 mg | ORAL_TABLET | Freq: Every day | ORAL | Status: DC
  Administered 2014-06-30 – 2014-07-01 (×2): 10 mg via ORAL
  Filled 2014-06-29 (×2): qty 1

## 2014-06-29 MED ORDER — POTASSIUM CHLORIDE CRYS ER 20 MEQ PO TBCR
40.0000 meq | EXTENDED_RELEASE_TABLET | Freq: Once | ORAL | Status: AC
  Administered 2014-06-29: 40 meq via ORAL
  Filled 2014-06-29: qty 2

## 2014-06-29 MED ORDER — AMLODIPINE BESYLATE 5 MG PO TABS
5.0000 mg | ORAL_TABLET | Freq: Every day | ORAL | Status: DC
  Administered 2014-06-29: 5 mg via ORAL
  Filled 2014-06-29: qty 1

## 2014-06-29 MED ORDER — HYDROCOD POLST-CHLORPHEN POLST 10-8 MG/5ML PO LQCR
5.0000 mL | Freq: Two times a day (BID) | ORAL | Status: DC | PRN
  Administered 2014-06-29 – 2014-06-30 (×3): 5 mL via ORAL
  Filled 2014-06-29 (×3): qty 5

## 2014-06-29 MED ORDER — TRAMADOL HCL 50 MG PO TABS
50.0000 mg | ORAL_TABLET | Freq: Four times a day (QID) | ORAL | Status: DC | PRN
  Administered 2014-06-29 – 2014-07-01 (×4): 50 mg via ORAL
  Filled 2014-06-29 (×4): qty 1

## 2014-06-29 MED ORDER — METOPROLOL TARTRATE 25 MG PO TABS
25.0000 mg | ORAL_TABLET | Freq: Two times a day (BID) | ORAL | Status: DC
Start: 2014-06-29 — End: 2014-06-30
  Administered 2014-06-29: 25 mg via ORAL
  Filled 2014-06-29: qty 1

## 2014-06-29 NOTE — Progress Notes (Addendum)
Patient ID: Sarah [name], female   DOB: 11-Jan-1966, 49 y.o.   MRN: [id number] [facility] PROGRESS NOTE  Sarah [name] HMC:[id number] DOB: 09/15/1965 DOA: 06/28/2014 PCP: No PCP Per Patient  Brief narrative:    49 year old female with history of hypertension who presented to [facility] ED with ongoing back pain for past week or so. Pain is occasionally sharp and radiates down to legs. No difficulty ambulating just pain which usually is 5-6/10 in intensity and not relieved with rest. It is little better with pain meds given in ED. No loss of sensation and loss of strength in legs. No falls. No other complaints of nausea, vomiting or abdominal pain. No chest pain, shortness of breath or palpitations. No fevers or cough.  In ED, pt was found to have BP of 268/148 which improved to 184/102 with labetalol 20 mg IV once and Cardene drip. Her blood work was essentially unremarkable. CT chest and abdomen showed mild abnormal mediastinal adenopathy and hilar adenopathy. Inflammatory and malignant etiology are not excluded such as metastatic disease, mycobacterium infection, lymphoma, sarcoidosis. There is a 3 mm left lower lobe pulmonary nodule and recommendations is to repeat CT scan in 1 year if pt at risk of bronchogenic carcinoma. She is not a smoker and denies second hand exposure to smoking. In regards to abdominal imaging studies within the abdomen, there is stranding and calcification in the omental fat worrisome for peritoneal carcinomatosis and indeterminate 4.4 cm right adnexal lesion. Pelvic ultrasound is recommended to further characterize. The uterus is notably absent.   She was admitted to SDU due to hypertensive urgency and requirement for Cardene drip.   Assessment/Plan:     Active Problems:  Accelerated hypertension / hypertensive urgency - BP on admission 268/148 and with labetalol 20 mg IV and Cardene drip BP improved to 184/102. - This morning, blood pressure is in the 140s  range. We will switch to Norvasc 5 mg daily and see if blood pressure stays at goal. - Order placed for transfer to telemetry today.   Pulmonary nodule / hilar and mediastinal lymphadenopathy / concern for peritoneal carcinomatosis / right adnexal lesion - suspicious for malignancy - We will try to get interventional radiology to biopsy adnexal mass seen on CT abdomen - Order placed for pelvic ultrasound for further evaluation - In regards to pulmonary nodule, this is a very small nodule in the left lower lobe and patient is not at high risk for bronchogenic carcinoma. Of note, she is not a smoker and no family history of malignancy, no exposure to toxic substances. This can be followed up outpatient with pulmonary. I spoke with pulmonary on call and we'll see if we can arrange follow-up appointment when patient is stable for discharge.    Back pain - Order placed for MRI of the lumbar spine for further evaluation   Code Status: Full Family Communication: Plan of care discussed with the patient  Disposition Plan: Transfer to telemetry today   IV access:  Peripheral IV  Procedures and diagnostic studies:    Ct Angio Chest Aorta W/cm &/or Wo/cm 06/28/2014 There is no evidence of thoracic or abdominal aortic aneurysm. There is noted to be circumferential narrowing of the innominate artery but this narrowing is not significant. It is associated with significant wall thickening. This may simply be due to atherosclerosis, however vasculitis is in the differential diagnosis including mycotic disorder. There is mild abnormal mediastinal adenopathy and hilar adenopathy. Inflammatory and malignant etiology are not excluded such as metastatic disease,  mycobacterium infection, lymphoma, sarcoidosis. 3 mm left lower lobe pulmonary nodule. If the patient is at high risk for bronchogenic carcinoma, follow-up chest CT at 1 year is recommended. If the patient is at low risk, no follow-up is needed.   Ct  Cta Abd/pel W/cm &/or W/o Cm 06/28/2014 There is no evidence of thoracic or abdominal aortic aneurysm. There is noted to be circumferential narrowing of the innominate artery but this narrowing is not significant. It is associated with significant wall thickening. This may simply be due to atherosclerosis, however vasculitis is in the differential diagnosis including mycotic disorder. There is mild abnormal mediastinal adenopathy and hilar adenopathy. Inflammatory and malignant etiology are not excluded such as metastatic disease, mycobacterium infection, lymphoma, sarcoidosis. 3 mm left lower lobe pulmonary nodule. If the patient is at high risk for bronchogenic carcinoma, follow-up chest CT at 1 year is recommended. If the patient is at low risk, no follow-up is needed. This recommendation follows the consensus statement: Guidelines for Management of Small Pulmonary Nodules Detected on CT Scans: A Statement from the [place] as published in Radiology 2005; 237:395-400. Within the abdomen, there is stranding and calcification in the omental fat worrisome for peritoneal carcinomatosis. There is a small amount of free fluid with dependent calcification. Indeterminate 4.4 cm right adnexal lesion. Pelvic ultrasound is recommended to further characterize. The uterus is notably absent. Electronically Signed By: Maryclare Bean M.D. On: 06/28/2014 13:19    Medical Consultants:  IR  Other Consultants:  None   IAnti-Infectives:   None    Leisa Lenz, MD  [facility] Pager [phone]  If 7PM-7AM, please contact night-coverage [email or web address] Password [email or web address] 06/29/2014, 11:00 AM   LOS: 1 day    HPI/Subjective: No acute overnight events. Has back pain this am about 5/10.  Objective: Filed Vitals:   06/29/14 0900 06/29/14 0930 06/29/14 0936 06/29/14 1000  BP: 160/84 161/90 161/90 150/87  Pulse: 103 91  83  Temp:      TempSrc:      Resp: 21 26  23   Height:      Weight:       SpO2: 95% 95%  96%    Intake/Output Summary (Last 24 hours) at 06/29/14 1100 Last data filed at 06/29/14 0800  Gross per 24 hour  Intake 1422.95 ml  Output   1825 ml  Net -402.05 ml    Exam:   General:  Pt is alert, follows commands appropriately, not in acute distress  Cardiovascular: Regular rate and rhythm, S1/S2 (+)  Respiratory: Clear to auscultation bilaterally, no wheezing, no crackles, no rhonchi  Abdomen: Soft, non tender, non distended, bowel sounds present  Extremities: No edema, pulses DP and PT palpable bilaterally  Neuro: Grossly nonfocal  Data Reviewed: Basic Metabolic Panel:  Recent Labs Lab 06/28/14 1114 06/29/14 0407  NA 136 138  K 3.5 3.2*  CL 104 104  CO2 24 26  GLUCOSE 117* 126*  BUN 13 11  CREATININE 0.95 0.92  CALCIUM 8.8 8.3*   Liver Function Tests:  Recent Labs Lab 06/29/14 0407  AST 29  ALT 15  ALKPHOS 50  BILITOT 1.1  PROT 7.9  ALBUMIN 3.6   No results for input(s): LIPASE, AMYLASE in the last 168 hours. No results for input(s): AMMONIA in the last 168 hours. CBC:  Recent Labs Lab 06/28/14 1114 06/29/14 0407  WBC 8.1 9.3  NEUTROABS 5.0  --   HGB 13.5 12.5  HCT 39.3 38.0  MCV 89.1 90.7  PLT 339 327  Cardiac Enzymes:  Recent Labs Lab 06/28/14 1114  TROPONINI 0.03   BNP: Invalid input(s): POCBNP CBG: No results for input(s): GLUCAP in the last 168 hours.  Recent Results (from the past 240 hour(s))  MRSA PCR Screening     Status: None   Collection Time: 06/28/14  6:13 PM  Result Value Ref Range Status   MRSA by PCR NEGATIVE NEGATIVE Final     Scheduled Meds: . amLODipine  5 mg Oral Daily  . antiseptic oral rinse  7 mL Mouth Rinse BID  . sodium chloride  3 mL Intravenous Q12H   Continuous Infusions: . sodium chloride Stopped (06/29/14 0800)  . niCARDipine Stopped (06/29/14 0800)

## 2014-06-29 NOTE — Progress Notes (Signed)
Report called. Pt transferred to [id number].

## 2014-06-29 NOTE — Progress Notes (Signed)
Attempted to call report, RN in huddle, to call back.

## 2014-06-29 NOTE — Progress Notes (Addendum)
IR PA aware of request for adnexal mass biopsy. [place] reviewed with Dr. Vernard Gambles today and adnexal mass too high risk for biopsy, but we will proceed with omental biopsy on 1/21. I have discussed this with Dr. Charlies Silvers and made the patient npo after midnight.  Tsosie Billing PA-C Interventional Radiology  06/29/14  3:11 PM    After discussion with Dr. Kathlene Cote today, IR will hold off on biopsy until GYN/ONC evaluates the patient. Given the sparse omental disease, he feels this sampling will be low yield. I will discuss this with Dr. Charlies Silvers.  Tsosie Billing PA-C Interventional Radiology  06/30/14  8:53 AM

## 2014-06-29 NOTE — Progress Notes (Signed)
Attempted to call report. RN to call back. 

## 2014-06-30 DIAGNOSIS — R938 Abnormal findings on diagnostic imaging of other specified body structures: Secondary | ICD-10-CM

## 2014-06-30 DIAGNOSIS — R971 Elevated cancer antigen 125 [CA 125]: Secondary | ICD-10-CM

## 2014-06-30 DIAGNOSIS — N8320 Unspecified ovarian cysts: Secondary | ICD-10-CM

## 2014-06-30 DIAGNOSIS — C569 Malignant neoplasm of unspecified ovary: Secondary | ICD-10-CM

## 2014-06-30 LAB — GLUCOSE, CAPILLARY: GLUCOSE-CAPILLARY: 117 mg/dL — AB (ref 70–99)

## 2014-06-30 LAB — CA 125: CA 125: 58 U/mL — ABNORMAL HIGH (ref <35)

## 2014-06-30 MED ORDER — HYDRALAZINE HCL 20 MG/ML IJ SOLN
10.0000 mg | Freq: Once | INTRAMUSCULAR | Status: AC
  Administered 2014-06-30: 10 mg via INTRAVENOUS
  Filled 2014-06-30: qty 1

## 2014-06-30 MED ORDER — LIP MEDEX EX OINT
TOPICAL_OINTMENT | CUTANEOUS | Status: DC | PRN
Start: 2014-06-30 — End: 2014-07-01
  Filled 2014-06-30: qty 7

## 2014-06-30 MED ORDER — METOPROLOL TARTRATE 25 MG PO TABS
12.5000 mg | ORAL_TABLET | Freq: Two times a day (BID) | ORAL | Status: DC
  Administered 2014-06-30 – 2014-07-01 (×3): 12.5 mg via ORAL
  Filled 2014-06-30 (×3): qty 1

## 2014-06-30 NOTE — Consult Note (Signed)
Consult Note: Gyn-Onc  Sarah [name] 49 y.o. female  CC:  Chief Complaint  Patient presents with  . Back Pain    HPI:  Patient is seen today in consultation at the request of Dr. Leisa Lenz.  Patient is a 49 year old gravida 2 para 2 who was admitted to the emergency room when she presented with ongoing back pain. She states that she has had low back pain. Progressively worse over the past 2-3 months. She states the pain associated with radiation down her legs and occasionally so severe that she cannot walk secondary to pain. She denies any neuropathy. She was seen in the emergency room she was found have a blood pressure of 260/148 which improved 184/102 with 20 mg of IV labetalol 1 and a Cardene drip. Blood work is essentially unremarkable. Imaging was performed while in the emergency room that revealed:  FINDINGS: CTA CHEST FINDINGS  There is no evidence of aortic dissection or intramural hematoma.There is no obvious filling defect in the pulmonary arterial tree.  There is circumferential wall thickening of the innominate artery resulting in mild narrowing. Little if any irregular plaque or atherosclerotic calcification is present in the thorax. Postoperative changes from left hemithyroidectomy are noted.  Several scattered mediastinal nodes are present. 1.6 cm short axis diameter paratracheal node on image 30. 1.1 cm right hilar node on image 47. 9 mm left hilar lymph node on image 45.  No pneumothorax. No pleural effusion.  Dependent atelectasis bilaterally with low volumes. 3 mm left lower lobe pulmonary nodule on image 43.  Review of the MIP images confirms the above findings.  CTA ABDOMEN AND PELVIS FINDINGS  There is no evidence of aortic aneurysm or aortic dissection.  Celiac is patent. Branch vessels are patent. There is mild narrowing at the origin of the SMA. Branch vessels are grossly patent.  IMA is diminutive and patent. Branch vessels are  grossly patent.  Single renal arteries are patent.  Bilateral common, internal, and external iliac arteries are patent. Mild atherosclerotic changes with calcification in the bilateral internal iliac arteries.  Liver, gallbladder, spleen, pancreas, adrenal glands, and kidneys are within normal limits.  No abnormal retroperitoneal adenopathy.  There is an indeterminate lesion in the right adnexa measuring 4.4 cm with elevated Hounsfield unit measurements. Left adnexa is unremarkable other than calcifications.  There is an unusual stranding within the peritoneal fat of the omentum hand in the anterior lower abdomen and pelvis. Within the regions of stranding, there is hyperdense linear areas most likely calcification. There is a small amount of free fluid in the pelvis with the dependent layering calcification.  Bladder is within normal limits. Uterus is absent.  Appendix is within normal limits.  Review of the MIP images confirms the above findings.  IMPRESSION: There is no evidence of thoracic or abdominal aortic aneurysm.  There is noted to be circumferential narrowing of the innominate artery but this narrowing is not significant. It is associated with significant wall thickening. This may simply be due to atherosclerosis, however vasculitis is in the differential diagnosis including mycotic disorder.  There is mild abnormal mediastinal adenopathy and hilar adenopathy. Inflammatory and malignant etiology are not excluded such as metastatic disease, mycobacterium infection, lymphoma, sarcoidosis.  3 mm left lower lobe pulmonary nodule. If the patient is at high risk for bronchogenic carcinoma, follow-up chest CT at 1 year is recommended. If the patient is at low risk, no follow-up is needed.  Within the abdomen, there is stranding and calcification in the  omental fat worrisome for peritoneal carcinomatosis. There is a small amount of free fluid with dependent  calcification.  Indeterminate 4.4 cm right adnexal lesion. Pelvic ultrasound is recommended to further characterize. The uterus is notably absent.  Ultrasound:  Right ovary  Measurements: 4.6 by 5.2 by 3.8 cm. Abnormal complex hypo echogenicity throughout most of the structure concerning for ovarian mass. This seems somewhat infiltrative and is difficult to separate from the adjacent ovarian parenchyma, but a portion of but measures 2.8 cm on image 54.  IMPRESSION: 1. Complex right ovarian mass or cyst, as on recent CT scan. Clearly given the omental caking this is the primary site of concern and ovarian carcinoma is suspected. 2. We were unable to demonstrate the left ovary sonographically.  The patient states that she has had some periodic sharp abdominal pains over the past 2-3 months. She believes that they're "gas pains". They never wake her up at night but the back pain does wake her up at night. She denies any nausea vomiting at home though she did have some Tuesday while in the hospital. Additionally she does not endorse any early satiety except while here in the hospital. She denies any significant change in her bowel or bladder habits. However, she states that for the past month she has 5-6 loose bowel movements per day. She had a hysterectomy proximal me 10 years ago by Dr. Warnell Forester for a fibroid uterus. She's never had a colonoscopy. She had a mammogram 3 years ago that she states was unremarkable. She has 2 maternal great aunts he may have had cancer she's not sure what type they had. There is no history of breast cancer. She has 2 sons age 41 and 70. 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 She denies use of tobacco.  Review of Systems  Constitutional:  Denies fever. 15 pound weight gain over the past 2-3 years Skin: No rash,  Cardiovascular: No chest pain, + shortness of breath, no edema. The patient states that she thinks she could go up a flight of stairs  for which patient would've breath at the top. Pulmonary: + productive cough, yellow sputum.  Gastro Intestinal: Reporting intermittent lower abdominal soreness as above.  +diarrhea reported. No bright red blood per rectum  Genitourinary: No frequency, urgency, or dysuria.  Denies vaginal bleeding and discharge.  Musculoskeletal: + back pain radiating down legs, no neuropathy. Neurologic: No weakness, numbness, or change in gait.   Right ovary  Measurements: 4.6 by 5.2 by 3.8 cm. Abnormal complex hypo echogenicity throughout most of the structure concerning for ovarian mass. This seems somewhat infiltrative and is difficult to separate from the adjacent ovarian parenchyma, but a portion of but measures 2.8 cm on image 54.  Left ovary  Not visible sonographically.  Other findings  No free fluid.  IMPRESSION: 1. Complex right ovarian mass or cyst, as on recent CT scan. Clearly given the omental caking this is the primary site of concern and ovarian carcinoma is suspected. 2. We were unable to demonstrate the left ovary sonographically.  CA-125 was drawn that was 58. It is for this reason that GYN oncology is asked to see her today  Current Meds: @ENCMED @  Allergy: No Known Allergies  Social Hx:   History   Social History  . Marital Status: Married    Spouse Name: N/A    Number of Children: N/A  . Years of Education: N/A   Occupational History  . Not on file.  Social History Main Topics  . Smoking status: Never Smoker   . Smokeless tobacco: Never Used  . Alcohol Use: No  . Drug Use: No  . Sexual Activity: Not on file   Other Topics Concern  . Not on file   Social History Narrative    Past Surgical Hx:  Past Surgical History  Procedure Laterality Date  . Abdominal hysterectomy    . Thyroidectomy, partial      Past Medical Hx:  Past Medical History  Diagnosis Date  . Asthma   . Hypertension   . Glucagonoma     Oncology Hx:   No history  exists.    Family Hx:  Family History  Problem Relation Age of Onset  . Hypertension Mother   . Hypertension Father   . Diabetes Father     Vitals:  Blood pressure 157/82, pulse 88, temperature 97.9 F (36.6 C), temperature source Oral, resp. rate 20, height 5' 5"  (1.651 m), weight 224 lb 3.3 oz (101.7 kg), SpO2 100 %.  Physical Exam: Exam was deferred as there was no door on the patient's room  Assessment/Plan: 49 year old who was admitted for hypertensive emergency. At the time she was found to have a thickened omentum, a right ovarian mass, and a slightly elevated CA-125. I met with the patient and her friend who was in the room. Unfortunately we were not able to do when exam today as there was no functioning door on the patient's inpatient unit room. The patient is willing to come to clinic at the time of discharge for follow-up for an examination.  However, based on the findings of the CT scan, ultrasound, and CA-125 I did speak to the patient about proceeding with a diagnostic laparoscopy and right salpingo-oophorectomy. She is amenable to this. She understands that the mass will be sent for frozen section. Should the mass be benign she would like to have the contralateral ovary removed. Should there be evidence of malignancy she will either have surgical debulking done via laparoscopy or if possible if not she'll undergo exploratory laparotomy and debulking surgery. She knows that Dr. Everitt Amber will be her surgery and we will tentatively plan for surgery for February 16. However, we will need to speak with her primary team to ensure that she is able to undergo general anesthesia and surgery considering the issues with her blood pressures.  The patient was provided our cards and contact information. Her questions were elicited and answered to her satisfaction. She will follow-up with [place] in clinic on January 29 and will see Dr. [name] at that time.  Please refer free to contact us if  there's any questions prior to her discharge.  Joeanna Howdyshell A., MD 06/30/2014, 2:55 PM

## 2014-06-30 NOTE — Progress Notes (Signed)
MEDICAL ONCOLOGY  06-30-2014   Reason for Consult: possible gyn cancer Referring Physician: [facility]  Sarah [name] is an 49 y.o. female seen in consultation at request of hospitalist service, with possible gyn cancer.  Patient had been in usual health, without recent medical care, until increased low back pain in last few weeks which had worsened over ~ 3 days. She presented to ED 06-28-14 with the low back pain, with evaluation including CT AP that had 4.4 cm indeterminate right adnexal lesion and some changes in omental fat concerning for peritoneal carcinomatosis; there was no adenopathy and no ascites. She is post hysterectomy without oophorectomy by Dr Chauncey Cruel.Fore ~ 2006 for benign indications. Pelvic and transvaginal US 06-29-14 had complex 4.6 x 5.2 x 3.8 cm right adnexal lesion concerning for ovarian mass or cyst. CA 125 is 58. MRI lumbar spine shows nothing of concern for metastatic disease to bone.  ROS weight has been stable. Some low abdominal discomfort x2 today. Bowels moving normally, 2-3x daily. Appetite good. No fever or symptoms of infection. Some sinus congestion. No cough. No swelling feet or legs. No cardiac symptoms. No bleeding. No noted changes in breasts.   Allergies: No Known Allergies Past Medical History  Diagnosis Date  . Asthma   . Hypertension   . Glucagonoma   hypertension x ~ 6 years   Past Surgical History  Procedure Laterality Date  . Abdominal hysterectomy    . Thyroidectomy, partial     Left thyroid lobectomy for benign nodule 2012 Sarah [name])  Family History  Problem Relation Age of Onset  . Hypertension Mother   . Hypertension Father   . Diabetes Father   sister fibromyalgia Sons ages 31 and 94 healthy  Social History:  reports that she has never smoked. She has never used smokeless tobacco. She reports that she does not drink alcohol or use illicit drugs. From [place], both sons live locally  Medications: reviewed in EMR  Blood  pressure 159/88, pulse 94, temperature 98.3 F (36.8 C), temperature source Oral, resp. rate 20, height _0  (1.651 m), weight 224 lb 3.3 oz (101.7 kg), SpO2 92 %. Physical Exam Pleasant, overweight lady looks stated age, NAD in bed on RA. Some difficulty sitting up from supine position due to back pain.  PERRL, not icteric. Oral mucosa moist and clear. No cervical, supraclavicular adenopathy. Lungs clear. Heart RRR no gallop. Abdomen obese, soft, not tender, normally active BS, no appreciable HSM or mass. LE no edema, cords, tenderness. Speech fluent and appropriate, moves all extremities easily. Appropriate mood and affect.    Results for orders placed or performed during the hospital encounter of 06/28/14 (from the past 48 hour(s))  MRSA PCR Screening     Status: None   Collection Time: 06/28/14  6:13 PM  Result Value Ref Range   MRSA by PCR NEGATIVE NEGATIVE    Comment:        The GeneXpert MRSA Assay (FDA approved for NASAL specimens only), is one component of a comprehensive MRSA colonization surveillance program. It is not intended to diagnose MRSA infection nor to guide or monitor treatment for MRSA infections.   Comprehensive metabolic panel     Status: Abnormal   Collection Time: 06/29/14  4:07 AM  Result Value Ref Range   Sodium 138 135 - 145 mmol/L    Comment: Please note change in reference range.   Potassium 3.2 (L) 3.5 - 5.1 mmol/L    Comment: Please note change in reference range.  Chloride 104 96 - 112 mEq/L   CO2 26 19 - 32 mmol/L   Glucose, Bld 126 (H) 70 - 99 mg/dL   BUN 11 6 - 23 mg/dL   Creatinine, Ser 0.92 0.50 - 1.10 mg/dL   Calcium 8.3 (L) 8.4 - 10.5 mg/dL   Total Protein 7.9 6.0 - 8.3 g/dL   Albumin 3.6 3.5 - 5.2 g/dL   AST 29 0 - 37 U/L   ALT 15 0 - 35 U/L   Alkaline Phosphatase 50 39 - 117 U/L   Total Bilirubin 1.1 0.3 - 1.2 mg/dL   GFR calc non Af Amer 72 (L) >90 mL/min   GFR calc Af Amer 84 (L) >90 mL/min    Comment: (NOTE) The eGFR has been  calculated using the CKD EPI equation. This calculation has not been validated in all clinical situations. eGFR's persistently <90 mL/min signify possible Chronic Kidney Disease.    Anion gap 8 5 - 15  CBC     Status: None   Collection Time: 06/29/14  4:07 AM  Result Value Ref Range   WBC 9.3 4.0 - 10.5 K/uL   RBC 4.19 3.87 - 5.11 MIL/uL   Hemoglobin 12.5 12.0 - 15.0 g/dL   HCT 38.0 36.0 - 46.0 %   MCV 90.7 78.0 - 100.0 fL   MCH 29.8 26.0 - 34.0 pg   MCHC 32.9 30.0 - 36.0 g/dL   RDW 14.1 11.5 - 15.5 %   Platelets 327 150 - 400 K/uL  Protime-INR     Status: None   Collection Time: 06/29/14  4:07 AM  Result Value Ref Range   Prothrombin Time 13.9 11.6 - 15.2 seconds   INR 1.06 0.00 - 1.49  Glucose, capillary     Status: Abnormal   Collection Time: 06/29/14  8:26 AM  Result Value Ref Range   Glucose-Capillary 101 (H) 70 - 99 mg/dL   Comment 1 Documented in Chart    Comment 2 Notify RN   CA 125     Status: Abnormal   Collection Time: 06/30/14  4:50 AM  Result Value Ref Range   CA 125 58 (H) <35 U/mL    Comment: (NOTE) This test was performed using the Beckman Coulter chemiluminescent method.  Values obtained from different assay methods cannot be used interchangeably.  CA125 levels , regardless of value, should not be interpreted as absolute evidence of the presence or absence of disease. **Please note change in methodology. If re-baselining is needed, for patients who are being serially tested, please call customer service, within 3 days of collection, to request to add on the appropriate re-baselining test code for the previous methodology, at no charge.** Performed at [facility]   Glucose, capillary     Status: Abnormal   Collection Time: 06/30/14  7:58 AM  Result Value Ref Range   Glucose-Capillary 117 (H) 70 - 99 mg/dL   Comment 1 Notify RN    Comment 2 Documented in Chart     Mr Lumbar Spine Wo Contrast  06/29/2014   CLINICAL DATA:  Ongoing back pain  for over a week radiating into both legs. No acute injury or prior relevant surgery. Initial encounter.  EXAM: MRI LUMBAR SPINE WITHOUT CONTRAST  TECHNIQUE: Multiplanar, multisequence MR imaging of the lumbar spine was performed. No intravenous contrast was administered.  COMPARISON:  Abdominal pelvic CT 06/28/2014.  FINDINGS: CT demonstrates 5 lumbar type vertebral bodies. The alignment is normal. There is no evidence of fracture or  pars defect.  The conus medullaris extends to the L1 level and appears normal. No paraspinal abnormalities are identified.Cystic lesion in the right adnexa is again demonstrated, measuring up to 4.9 cm. There is mild stranding in the surrounding pelvic fat.  There are no significant disc space findings from T11-12 through L2-3.  L3-4: Disc height and hydration are maintained. There is mild bilateral facet hypertrophy. No spinal stenosis or nerve root encroachment.  L4-5: Disc height and hydration are maintained. There is mild bilateral facet hypertrophy. No spinal stenosis or nerve root encroachment.  L5-S1: There is a small right paracentral disc protrusion and mild bilateral facet hypertrophy. No spinal stenosis or nerve root encroachment.  IMPRESSION: 1. No acute or significant findings are demonstrated within the lumbar spine. There is a small right paracentral disc protrusion at L5-S1 and mild facet hypertrophy in the lower lumbar spine. 2. The spinal canal and neural foramina are widely patent at all levels. There is no nerve root encroachment. 3. Right adnexal lesion for which pelvic ultrasound is scheduled today.   Electronically Signed   By: Camie Patience M.D.   On: 06/29/2014 15:13   US Transvaginal Non-ob  06/29/2014   CLINICAL DATA:  Peritoneal carcinomatosis.  EXAM: TRANSABDOMINAL AND TRANSVAGINAL ULTRASOUND OF PELVIS  TECHNIQUE: Both transabdominal and transvaginal ultrasound examinations of the pelvis were performed. Transabdominal technique was performed for global  imaging of the pelvis including uterus, ovaries, adnexal regions, and pelvic cul-de-sac. It was necessary to proceed with endovaginal exam following the transabdominal exam to visualize the ovaries.  COMPARISON:  06/28/2014  FINDINGS: Uterus  Absent due to hysterectomy.  Endometrium  Absent due to hysterectomy.  Right ovary  Measurements: 4.6 by 5.2 by 3.8 cm. Abnormal complex hypo echogenicity throughout most of the structure concerning for ovarian mass. This seems somewhat infiltrative and is difficult to separate from the adjacent ovarian parenchyma, but a portion of but measures 2.8 cm on image 54.  Left ovary  Not visible sonographically.  Other findings  No free fluid.  IMPRESSION: 1. Complex right ovarian mass or cyst, as on recent CT scan. Clearly given the omental caking this is the primary site of concern and ovarian carcinoma is suspected. 2. We were unable to demonstrate the left ovary sonographically.   Electronically Signed   By: Sherryl Barters M.D.   On: 06/29/2014 15:53   US Pelvis Complete  06/29/2014   CLINICAL DATA:  Peritoneal carcinomatosis.  EXAM: TRANSABDOMINAL AND TRANSVAGINAL ULTRASOUND OF PELVIS  TECHNIQUE: Both transabdominal and transvaginal ultrasound examinations of the pelvis were performed. Transabdominal technique was performed for global imaging of the pelvis including uterus, ovaries, adnexal regions, and pelvic cul-de-sac. It was necessary to proceed with endovaginal exam following the transabdominal exam to visualize the ovaries.  COMPARISON:  06/28/2014  FINDINGS: Uterus  Absent due to hysterectomy.  Endometrium  Absent due to hysterectomy.  Right ovary  Measurements: 4.6 by 5.2 by 3.8 cm. Abnormal complex hypo echogenicity throughout most of the structure concerning for ovarian mass. This seems somewhat infiltrative and is difficult to separate from the adjacent ovarian parenchyma, but a portion of but measures 2.8 cm on image 54.  Left ovary  Not visible sonographically.   Other findings  No free fluid.  IMPRESSION: 1. Complex right ovarian mass or cyst, as on recent CT scan. Clearly given the omental caking this is the primary site of concern and ovarian carcinoma is suspected. 2. We were unable to demonstrate the left ovary sonographically.  Electronically Signed   By: Sherryl Barters M.D.   On: 06/29/2014 15:53     Case discussed with Dr Nancy Marus.    Assessment/Plan: 1.Right adnexal abnormality noted incidentallly on imaging done for low back pain, with some elevation of CA 125: gyn oncology will see her for full exam after discharge from hospital, with possible surgery subsequently. OK not to try any biopsies by IR. 2.low back pain: nothing of concern for metastatic disease. Evaluation and interventions underway 3.obesity 4.overdue mammograms, can be addressed outpatient 5.no colonoscopy, can be addressed outpatient 6. Post hysterectomy without oophorectomy, for benign indications 7.HTN 8.history of asthma 9.post left thyroid lobectomy for benign nodule  I am glad to see her if needed depending on gyn oncology findings.   LIVESAY,LENNIS P 06/30/2014, 12:56 PM

## 2014-06-30 NOTE — Progress Notes (Addendum)
Patient ID: Sarah [name], female   DOB: 10-Jul-1965, 49 y.o.   MRN: [id number] [facility] PROGRESS NOTE  Evelise Reine HAL:[id number] DOB: [birth year] DOA: 06/28/2014 PCP: No PCP Per Patient  Brief narrative:    49 year old female with history of hypertension who presented to [facility] ED with ongoing back pain for past week or so. Pain is occasionally sharp and radiates down to legs. No difficulty ambulating just pain which usually is 5-6/10 in intensity and not relieved with rest. It is little better with pain meds given in ED. No loss of sensation and loss of strength in legs. No falls. No other complaints of nausea, vomiting or abdominal pain. No chest pain, shortness of breath or palpitations. No fevers or cough.  In ED, pt was found to have BP of 268/148 which improved to 184/102 with labetalol 20 mg IV once and Cardene drip. Her blood work was essentially unremarkable. CT chest and abdomen showed mild abnormal mediastinal adenopathy and hilar adenopathy. Inflammatory and malignant etiology are not excluded such as metastatic disease, mycobacterium infection, lymphoma, sarcoidosis. There is a 3 mm left lower lobe pulmonary nodule and recommendations is to repeat CT scan in 1 year if pt at risk of bronchogenic carcinoma. She is not a smoker and denies second hand exposure to smoking. In regards to abdominal imaging studies within the abdomen, there is stranding and calcification in the omental fat worrisome for peritoneal carcinomatosis and indeterminate 4.4 cm right adnexal lesion. The uterus is notably absent. Pelvic US showed complex right ovarian mass or cyst, as on recent CT scan. Clearly given the omental caking this is the primary site of concern and ovarian carcinoma is suspected. Unable to demonstrate the left ovary sonographically. Oncology will see the pt in consultation.    Assessment/Plan:     Active Problems:  Accelerated hypertension / hypertensive urgency - please  note that BP on admission 268/148 and with labetalol 20 mg IV and Cardene drip BP improved to 184/102. - BP continues to be stable off of Cardene drip. She is on low dose metoprolol and norvasc which she will continue on discharge.    Pulmonary nodule / hilar and mediastinal lymphadenopathy / concern for peritoneal carcinomatosis and ovarian carcinoma / right adnexal lesion - per IR, unable to biopsy adnexal mass or omentum. GYN onc to see the pt in consultation. - In regards to pulmonary nodule, this is a very small nodule in the left lower lobe and patient is not at high risk for bronchogenic carcinoma. Of note, she is not a smoker and no family history of malignancy, no exposure to toxic substances. This can be followed up outpatient with pulmonary. I spoke with pulmonary on call and we'll see if we can arrange follow-up appointment when patient is stable for discharge.   Back pain - no acute findings on MRI lumbar spine   Code Status: Full Family Communication: Plan of care discussed with the patient  Disposition Plan: home when stable     IV access:  Peripheral IV  Procedures and diagnostic studies:    Ct Angio Chest Aorta W/cm &/or Wo/cm 06/28/2014 There is no evidence of thoracic or abdominal aortic aneurysm. There is noted to be circumferential narrowing of the innominate artery but this narrowing is not significant. It is associated with significant wall thickening. This may simply be due to atherosclerosis, however vasculitis is in the differential diagnosis including mycotic disorder. There is mild abnormal mediastinal adenopathy and hilar adenopathy. Inflammatory and malignant etiology are not  excluded such as metastatic disease, mycobacterium infection, lymphoma, sarcoidosis. 3 mm left lower lobe pulmonary nodule. If the patient is at high risk for bronchogenic carcinoma, follow-up chest CT at 1 year is recommended. If the patient is at low risk, no follow-up is needed.    Ct Cta Abd/pel W/cm &/or W/o Cm 06/28/2014 There is no evidence of thoracic or abdominal aortic aneurysm. There is noted to be circumferential narrowing of the innominate artery but this narrowing is not significant. It is associated with significant wall thickening. This may simply be due to atherosclerosis, however vasculitis is in the differential diagnosis including mycotic disorder. There is mild abnormal mediastinal adenopathy and hilar adenopathy. Inflammatory and malignant etiology are not excluded such as metastatic disease, mycobacterium infection, lymphoma, sarcoidosis. 3 mm left lower lobe pulmonary nodule. If the patient is at high risk for bronchogenic carcinoma, follow-up chest CT at 1 year is recommended. If the patient is at low risk, no follow-up is needed. This recommendation follows the consensus statement: Guidelines for Management of Small Pulmonary Nodules Detected on CT Scans: A Statement from the [employer] as published in Radiology 2005; 237:395-400. Within the abdomen, there is stranding and calcification in the omental fat worrisome for peritoneal carcinomatosis. There is a small amount of free fluid with dependent calcification. Indeterminate 4.4 cm right adnexal lesion. Pelvic ultrasound is recommended to further characterize. The uterus is notably absent.  Mr Lumbar Spine Wo Contrast 06/29/2014    1. No acute or significant findings are demonstrated within the lumbar spine. There is a small right paracentral disc protrusion at L5-S1 and mild facet hypertrophy in the lower lumbar spine. 2. The spinal canal and neural foramina are widely patent at all levels. There is no nerve root encroachment. 3. Right adnexal lesion for which pelvic ultrasound is scheduled today.   Electronically Signed   By: Camie Patience M.D.   On: 06/29/2014 15:13   US Transvaginal Non-ob 06/29/2014  1. Complex right ovarian mass or cyst, as on recent CT scan. Clearly given the omental caking  this is the primary site of concern and ovarian carcinoma is suspected. 2. We were unable to demonstrate the left ovary sonographically.   US Pelvis Complete 06/29/2014    1. Complex right ovarian mass or cyst, as on recent CT scan. Clearly given the omental caking this is the primary site of concern and ovarian carcinoma is suspected. 2. We were unable to demonstrate the left ovary sonographically.      Medical Consultants:  IR Oncology GYN  Other Consultants:  None   IAnti-Infectives:   None    Leisa Lenz, MD  [facility] Pager [phone]  If 7PM-7AM, please contact night-coverage [email or web address] Password [email or web address] 06/30/2014, 10:30 AM   LOS: 2 days    HPI/Subjective: No acute overnight events.  Objective: Filed Vitals:   06/29/14 1237 06/29/14 2105 06/30/14 0612 06/30/14 0806  BP: 141/92 165/97 180/103 135/80  Pulse: 89 86 91   Temp: 98.1 F (36.7 C) 98.8 F (37.1 C) 98.3 F (36.8 C)   TempSrc: Oral Oral Oral   Resp: 18 20 20    Height: 5\' 5"  (1.651 m)     Weight: 102.4 kg (225 lb 12 oz)  101.7 kg (224 lb 3.3 oz)   SpO2: 90% 96% 92%     Intake/Output Summary (Last 24 hours) at 06/30/14 1030 Last data filed at 06/29/14 1858  Gross per 24 hour  Intake    720 ml  Output    350  ml  Net    370 ml    Exam:   General:  Pt is alert, not in acute distress  Cardiovascular: Regular rate and rhythm, S1/S 2 (+)  Respiratory: Clear to auscultation bilaterally, no wheezing, no crackles, no rhonchi  Abdomen: non distended, bowel sounds present  Extremities:pulses DP and PT palpable bilaterally  Neuro: nonfocal  Data Reviewed: Basic Metabolic Panel:  Recent Labs Lab 06/28/14 1114 06/29/14 0407  NA 136 138  K 3.5 3.2*  CL 104 104  CO2 24 26  GLUCOSE 117* 126*  BUN 13 11  CREATININE 0.95 0.92  CALCIUM 8.8 8.3*   Liver Function Tests:  Recent Labs Lab 06/29/14 0407  AST 29  ALT 15  ALKPHOS 50  BILITOT 1.1  PROT 7.9  ALBUMIN 3.6   No  results for input(s): LIPASE, AMYLASE in the last 168 hours. No results for input(s): AMMONIA in the last 168 hours. CBC:  Recent Labs Lab 06/28/14 1114 06/29/14 0407  WBC 8.1 9.3  NEUTROABS 5.0  --   HGB 13.5 12.5  HCT 39.3 38.0  MCV 89.1 90.7  PLT 339 327   Cardiac Enzymes:  Recent Labs Lab 06/28/14 1114  TROPONINI 0.03   BNP: Invalid input(s): POCBNP CBG:  Recent Labs Lab 06/29/14 0826 06/30/14 0758  GLUCAP 101* 117*    Recent Results (from the past 240 hour(s))  MRSA PCR Screening     Status: None   Collection Time: 06/28/14  6:13 PM  Result Value Ref Range Status   MRSA by PCR NEGATIVE NEGATIVE Final     Scheduled Meds: . amLODipine  10 mg Oral Daily  . antiseptic oral rinse  7 mL Mouth Rinse BID  . metoprolol tartrate  12.5 mg Oral BID  . sodium chloride  3 mL Intravenous Q12H   Continuous Infusions: . sodium chloride Stopped (06/29/14 0800)

## 2014-07-01 DIAGNOSIS — R599 Enlarged lymph nodes, unspecified: Secondary | ICD-10-CM

## 2014-07-01 DIAGNOSIS — R590 Localized enlarged lymph nodes: Secondary | ICD-10-CM | POA: Insufficient documentation

## 2014-07-01 DIAGNOSIS — I10 Essential (primary) hypertension: Secondary | ICD-10-CM

## 2014-07-01 DIAGNOSIS — N949 Unspecified condition associated with female genital organs and menstrual cycle: Secondary | ICD-10-CM

## 2014-07-01 DIAGNOSIS — M545 Low back pain: Principal | ICD-10-CM

## 2014-07-01 LAB — GLUCOSE, CAPILLARY: GLUCOSE-CAPILLARY: 110 mg/dL — AB (ref 70–99)

## 2014-07-01 MED ORDER — METOPROLOL TARTRATE 25 MG PO TABS: 12.5000 mg | ORAL_TABLET | Freq: Two times a day (BID) | ORAL | Status: DC

## 2014-07-01 MED ORDER — AMLODIPINE BESYLATE 10 MG PO TABS: 10.0000 mg | ORAL_TABLET | Freq: Every day | ORAL | Status: DC

## 2014-07-01 MED ORDER — OXYCODONE-ACETAMINOPHEN 5-325 MG PO TABS
1.0000 | ORAL_TABLET | ORAL | Status: DC | PRN
  Administered 2014-07-01: 1 via ORAL
  Filled 2014-07-01: qty 1

## 2014-07-01 MED ORDER — OXYCODONE-ACETAMINOPHEN 5-325 MG PO TABS: 1.0000 | ORAL_TABLET | ORAL | Status: DC | PRN

## 2014-07-01 NOTE — Progress Notes (Signed)
CARE MANAGEMENT NOTE 07/01/2014  Patient:  Sarah [name], Sarah [name]   Account Number:  [id number]  Date Initiated:  07/01/2014  Documentation initiated by:  Karl Bales  Subjective/Objective Assessment:   Pt admitted with HTN     Action/Plan:   from home   Anticipated DC Date:  07/01/2014   Anticipated DC Plan:  [place]  CM consult  [facility]      Choice offered to / List presented to:             Status of service:  Completed, signed off Medicare Important Message given?   (If response is "NO", the following Medicare IM given date fields will be blank) Date Medicare IM given:   Medicare IM given by:   Date Additional Medicare IM given:   Additional Medicare IM given by:    Discharge Disposition:  HOME/SELF CARE  Per UR Regulation:  Reviewed for med. necessity/level of care/duration of stay  If discussed at [place] of Stay Meetings, dates discussed:    Comments:  07/01/14 MMcGibboney, RN, BSN Spoke with pt and husband concerning medications and appointment with [facility].  Information given to pt to call for an appointment with [place] related to closure of weather. Coupon and information given to pt for medications.  Medications at [facility] on $4/ Norvasc $5/. Pt also states, "I can get money from my mother for my medication."

## 2014-07-01 NOTE — Discharge Instructions (Signed)

## 2014-07-01 NOTE — Progress Notes (Signed)
Patient very reluctant to take pain medication when offered by nursing staff.  When patient does finally take pain medication she does not appear to be getting much relief.  Patient may benefit from a change in type of pain medication.  Will continue to monitor. Owens Shark, Sarah [name]

## 2014-07-01 NOTE — Discharge Summary (Signed)
Physician Discharge Summary  Sarah [name] EAV:[id number] DOB: July 01, 1965 DOA: 06/28/2014  PCP: No PCP Per Patient  Admit date: 06/28/2014 Discharge date: 07/01/2014  Recommendations for Outpatient Follow-up:  1. Patient will follow up with GYN oncology / surgery per scheduled appt. Surgery scheduled tentatively 02/16. 2. Norvasc and metoprolol on discharge for blood pressure control.  Discharge Diagnoses:  Principal Problem:   Hypertensive urgency    Discharge Condition: stable   Diet recommendation: as tolerated   History of present illness:  49 year old female with history of hypertension who presented to [facility] ED with ongoing back pain for past week or so. Pain is occasionally sharp and radiates down to legs. No difficulty ambulating just pain which usually is 5-6/10 in intensity and not relieved with rest. It is little better with pain meds given in ED. No loss of sensation and loss of strength in legs. No falls. No other complaints of nausea, vomiting or abdominal pain. No chest pain, shortness of breath or palpitations. No fevers or cough.  In ED, pt was found to have BP of 268/148 which improved to 184/102 with labetalol 20 mg IV once and Cardene drip. Patient was admitted to stepdown unit because of hypertensive urgency and requirement for Cardizem drip. Her blood work was essentially unremarkable. CT chest and abdomen showed mild abnormal mediastinal adenopathy and hilar adenopathy. Inflammatory and malignant etiology are not excluded such as metastatic disease, mycobacterium infection, lymphoma, sarcoidosis. There is a 3 mm left lower lobe pulmonary nodule and recommendations is to repeat CT scan in 1 year if pt at risk of bronchogenic carcinoma. She is not a smoker and denies second hand exposure to smoking. In regards to abdominal imaging studies within the abdomen, there is stranding and calcification in the omental fat worrisome for peritoneal carcinomatosis and  indeterminate 4.4 cm right adnexal lesion. The uterus is notably absent. Pelvic US showed complex right ovarian mass or cyst, as on recent CT scan. Clearly given the omental caking this is the primary site of concern and ovarian carcinoma is suspected. Unable to demonstrate the left ovary sonographically.   Patient was seen by GYN oncology consultation. Appreciate their recommendations. Debulking surgery tentatively planned for 07/26/2014. Patient is aware of this and knows where to go and who to follow with for this surgery.   Assessment/Plan:     Active Problems:  Accelerated hypertension / hypertensive urgency - please note that BP on admission was 268/148 and with labetalol 20 mg IV and Cardene drip BP improved to 184/102. Patient was then started on Norvasc and metoprolol. Her blood pressure is 159/96. We will continue the current regimen on discharge. I spoke with patient about following with primary care physician, have blood pressure rechecked and make adjustments to blood pressure medications. Please note that this blood pressure of 159/96 is before patient to morning blood pressure medications. - Off note, patient was transferred from step down unit to medical floor on 06/29/2014   Pulmonary nodule / hilar and mediastinal lymphadenopathy / concern for peritoneal carcinomatosis and ovarian carcinoma / right adnexal lesion - per IR, unable to biopsy adnexal mass or omentum.  - Patient was seen by GYN oncology consultation. Recommendation is for debulking surgery tentatively scheduled 07/26/2014. - In regards to pulmonary nodule, this is a very small nodule in the left lower lobe and patient is not at high risk for bronchogenic carcinoma. Of note, she is not a smoker and she cannot recall  family history of malignancy, no history of exposure to  toxic substances.  - Patient will require follow-up CAT scan, per radiology in one year but because of new finding of what seems to be ovarian  carcinoma she may require sooner CT scan studies. We will leave it up to oncology to schedule imaging studies.   Back pain - no acute findings on MRI lumbar spine   Code Status: Full Family Communication: Plan of care discussed with the patient    IV access:  Peripheral IV  Procedures and diagnostic studies:    Ct Angio Chest Aorta W/cm &/or Wo/cm 06/28/2014 There is no evidence of thoracic or abdominal aortic aneurysm. There is noted to be circumferential narrowing of the innominate artery but this narrowing is not significant. It is associated with significant wall thickening. This may simply be due to atherosclerosis, however vasculitis is in the differential diagnosis including mycotic disorder. There is mild abnormal mediastinal adenopathy and hilar adenopathy. Inflammatory and malignant etiology are not excluded such as metastatic disease, mycobacterium infection, lymphoma, sarcoidosis. 3 mm left lower lobe pulmonary nodule. If the patient is at high risk for bronchogenic carcinoma, follow-up chest CT at 1 year is recommended. If the patient is at low risk, no follow-up is needed.   Ct Cta Abd/pel W/cm &/or W/o Cm 06/28/2014 There is no evidence of thoracic or abdominal aortic aneurysm. There is noted to be circumferential narrowing of the innominate artery but this narrowing is not significant. It is associated with significant wall thickening. This may simply be due to atherosclerosis, however vasculitis is in the differential diagnosis including mycotic disorder. There is mild abnormal mediastinal adenopathy and hilar adenopathy. Inflammatory and malignant etiology are not excluded such as metastatic disease, mycobacterium infection, lymphoma, sarcoidosis. 3 mm left lower lobe pulmonary nodule. If the patient is at high risk for bronchogenic carcinoma, follow-up chest CT at 1 year is recommended. If the patient is at low risk, no follow-up is needed. This recommendation follows  the consensus statement: Guidelines for Management of Small Pulmonary Nodules Detected on CT Scans: A Statement from the [employer] as published in Radiology 2005; 237:395-400. Within the abdomen, there is stranding and calcification in the omental fat worrisome for peritoneal carcinomatosis. There is a small amount of free fluid with dependent calcification. Indeterminate 4.4 cm right adnexal lesion. Pelvic ultrasound is recommended to further characterize. The uterus is notably absent.  Mr Lumbar Spine Wo Contrast 06/29/2014    1. No acute or significant findings are demonstrated within the lumbar spine. There is a small right paracentral disc protrusion at L5-S1 and mild facet hypertrophy in the lower lumbar spine. 2. The spinal canal and neural foramina are widely patent at all levels. There is no nerve root encroachment. 3. Right adnexal lesion for which pelvic ultrasound is scheduled today.   Electronically Signed   By: Camie Patience M.D.   On: 06/29/2014 15:13   US Transvaginal Non-ob 06/29/2014  1. Complex right ovarian mass or cyst, as on recent CT scan. Clearly given the omental caking this is the primary site of concern and ovarian carcinoma is suspected. 2. We were unable to demonstrate the left ovary sonographically.   US Pelvis Complete 06/29/2014    1. Complex right ovarian mass or cyst, as on recent CT scan. Clearly given the omental caking this is the primary site of concern and ovarian carcinoma is suspected. 2. We were unable to demonstrate the left ovary sonographically.      Medical Consultants:  IR Oncology GYN  Other Consultants:  None  IAnti-Infectives:   None   Signed:  Leisa Lenz, MD  [facility] 07/01/2014, 11:19 AM  Pager #: [phone]   Discharge Exam: Filed Vitals:   07/01/14 0906  BP: 159/96  Pulse: 88  Temp:   Resp:    Filed Vitals:   06/30/14 1421 06/30/14 2211 07/01/14 0640 07/01/14 0906  BP: 157/82 141/95 148/84 159/96  Pulse:  88 95 88 88  Temp: 97.9 F (36.6 C) 98.4 F (36.9 C) 98.2 F (36.8 C)   TempSrc: Oral Oral Oral   Resp: 20 24 20    Height:      Weight:   101.8 kg (224 lb 6.9 oz)   SpO2: 100% 91% 92%     General: No acute distress, sitting comfortably in bed Cardiovascular: Regular rate and rhythm, S1/S2 appreciated Respiratory: Bilateral air entry, no wheezing Abdominal: Appreciated bowel sounds, nondistended abdomen, nontender Extremities: no edema, no cyanosis, pulses palpable bilaterally DP and PT Neuro: Grossly nonfocal  Discharge Instructions  Discharge Instructions    Call MD for:  difficulty breathing, headache or visual disturbances    Complete by:  As directed      Call MD for:  persistant dizziness or light-headedness    Complete by:  As directed      Call MD for:  persistant nausea and vomiting    Complete by:  As directed      Call MD for:  severe uncontrolled pain    Complete by:  As directed      Diet - low sodium heart healthy    Complete by:  As directed      Discharge instructions    Complete by:  As directed   1. Patient will follow up with GYN oncology / surgery per scheduled appt. Surgery scheduled tentatively 02/16. 2. Norvasc and metoprolol on discharge for blood pressure control.     Increase activity slowly    Complete by:  As directed             Medication List    TAKE these medications        acetaminophen 500 MG tablet  Commonly known as:  TYLENOL  Take 1,000 mg by mouth every 6 (six) hours as needed for mild pain or headache.     amLODipine 10 MG tablet  Commonly known as:  NORVASC  Take 1 tablet (10 mg total) by mouth daily.     metoprolol tartrate 25 MG tablet  Commonly known as:  LOPRESSOR  Take 0.5 tablets (12.5 mg total) by mouth 2 (two) times daily.     oxyCODONE-acetaminophen 5-325 MG per tablet  Commonly known as:  PERCOCET/ROXICET  Take 1 tablet by mouth every 4 (four) hours as needed for moderate pain.           Follow-up  Information    Follow up with Donaciano Eva, MD On 07/08/2014.   Specialty:  Obstetrics and Gynecology   Why:  at 9:45am at the [facility].  Please arrive at 9:15am to register.   Contact information:   [address] [place] [id number] [phone]        The results of significant diagnostics from this hospitalization (including imaging, microbiology, ancillary and laboratory) are listed below for reference.    Significant Diagnostic Studies: Mr Lumbar Spine Wo Contrast  06/29/2014   CLINICAL DATA:  Ongoing back pain for over a week radiating into both legs. No acute injury or prior relevant surgery. Initial encounter.  EXAM: MRI LUMBAR SPINE  WITHOUT CONTRAST  TECHNIQUE: Multiplanar, multisequence MR imaging of the lumbar spine was performed. No intravenous contrast was administered.  COMPARISON:  Abdominal pelvic CT 06/28/2014.  FINDINGS: CT demonstrates 5 lumbar type vertebral bodies. The alignment is normal. There is no evidence of fracture or pars defect.  The conus medullaris extends to the L1 level and appears normal. No paraspinal abnormalities are identified.Cystic lesion in the right adnexa is again demonstrated, measuring up to 4.9 cm. There is mild stranding in the surrounding pelvic fat.  There are no significant disc space findings from T11-12 through L2-3.  L3-4: Disc height and hydration are maintained. There is mild bilateral facet hypertrophy. No spinal stenosis or nerve root encroachment.  L4-5: Disc height and hydration are maintained. There is mild bilateral facet hypertrophy. No spinal stenosis or nerve root encroachment.  L5-S1: There is a small right paracentral disc protrusion and mild bilateral facet hypertrophy. No spinal stenosis or nerve root encroachment.  IMPRESSION: 1. No acute or significant findings are demonstrated within the lumbar spine. There is a small right paracentral disc protrusion at L5-S1 and mild facet hypertrophy in the lower lumbar spine. 2.  The spinal canal and neural foramina are widely patent at all levels. There is no nerve root encroachment. 3. Right adnexal lesion for which pelvic ultrasound is scheduled today.   Electronically Signed   By: Camie Patience M.D.   On: 06/29/2014 15:13   US Transvaginal Non-ob  06/29/2014   CLINICAL DATA:  Peritoneal carcinomatosis.  EXAM: TRANSABDOMINAL AND TRANSVAGINAL ULTRASOUND OF PELVIS  TECHNIQUE: Both transabdominal and transvaginal ultrasound examinations of the pelvis were performed. Transabdominal technique was performed for global imaging of the pelvis including uterus, ovaries, adnexal regions, and pelvic cul-de-sac. It was necessary to proceed with endovaginal exam following the transabdominal exam to visualize the ovaries.  COMPARISON:  06/28/2014  FINDINGS: Uterus  Absent due to hysterectomy.  Endometrium  Absent due to hysterectomy.  Right ovary  Measurements: 4.6 by 5.2 by 3.8 cm. Abnormal complex hypo echogenicity throughout most of the structure concerning for ovarian mass. This seems somewhat infiltrative and is difficult to separate from the adjacent ovarian parenchyma, but a portion of but measures 2.8 cm on image 54.  Left ovary  Not visible sonographically.  Other findings  No free fluid.  IMPRESSION: 1. Complex right ovarian mass or cyst, as on recent CT scan. Clearly given the omental caking this is the primary site of concern and ovarian carcinoma is suspected. 2. We were unable to demonstrate the left ovary sonographically.   Electronically Signed   By: Sherryl Barters M.D.   On: 06/29/2014 15:53   US Pelvis Complete  06/29/2014   CLINICAL DATA:  Peritoneal carcinomatosis.  EXAM: TRANSABDOMINAL AND TRANSVAGINAL ULTRASOUND OF PELVIS  TECHNIQUE: Both transabdominal and transvaginal ultrasound examinations of the pelvis were performed. Transabdominal technique was performed for global imaging of the pelvis including uterus, ovaries, adnexal regions, and pelvic cul-de-sac. It was  necessary to proceed with endovaginal exam following the transabdominal exam to visualize the ovaries.  COMPARISON:  06/28/2014  FINDINGS: Uterus  Absent due to hysterectomy.  Endometrium  Absent due to hysterectomy.  Right ovary  Measurements: 4.6 by 5.2 by 3.8 cm. Abnormal complex hypo echogenicity throughout most of the structure concerning for ovarian mass. This seems somewhat infiltrative and is difficult to separate from the adjacent ovarian parenchyma, but a portion of but measures 2.8 cm on image 54.  Left ovary  Not visible sonographically.  Other findings  No free fluid.  IMPRESSION: 1. Complex right ovarian mass or cyst, as on recent CT scan. Clearly given the omental caking this is the primary site of concern and ovarian carcinoma is suspected. 2. We were unable to demonstrate the left ovary sonographically.   Electronically Signed   By: Sherryl Barters M.D.   On: 06/29/2014 15:53   Ct Angio Chest Aorta W/cm &/or Wo/cm  06/28/2014   CLINICAL DATA:  Low back pain for 2 weeks. Short of breath. Evaluate for dissection.  EXAM: CT ANGIOGRAPHY CHEST, ABDOMEN AND PELVIS  TECHNIQUE: Multidetector CT imaging through the chest, abdomen and pelvis was performed using the standard protocol during bolus administration of intravenous contrast. Multiplanar reconstructed images and MIPs were obtained and reviewed to evaluate the vascular anatomy.  CONTRAST:  166mL OMNIPAQUE IOHEXOL 350 MG/ML SOLN  COMPARISON:  None.  FINDINGS: CTA CHEST FINDINGS  There is no evidence of aortic dissection or intramural hematoma. There is no obvious filling defect in the pulmonary arterial tree.  There is circumferential wall thickening of the innominate artery resulting in mild narrowing. Little if any irregular plaque or atherosclerotic calcification is present in the thorax. Postoperative changes from left hemithyroidectomy are noted.  Several scattered mediastinal nodes are present. 1.6 cm short axis diameter paratracheal node on  image 30. 1.1 cm right hilar node on image 47. 9 mm left hilar lymph node on image 45.  No pneumothorax.  No pleural effusion.  Dependent atelectasis bilaterally with low volumes. 3 mm left lower lobe pulmonary nodule on image 43.  Review of the MIP images confirms the above findings.  CTA ABDOMEN AND PELVIS FINDINGS  There is no evidence of aortic aneurysm or aortic dissection.  Celiac is patent. Branch vessels are patent. There is mild narrowing at the origin of the SMA. Branch vessels are grossly patent.  IMA is diminutive and patent.  Branch vessels are grossly patent.  Single renal arteries are patent.  Bilateral common, internal, and external iliac arteries are patent. Mild atherosclerotic changes with calcification in the bilateral internal iliac arteries.  Liver, gallbladder, spleen, pancreas, adrenal glands, and kidneys are within normal limits.  No abnormal retroperitoneal adenopathy.  There is an indeterminate lesion in the right adnexa measuring 4.4 cm with elevated Hounsfield unit measurements. Left adnexa is unremarkable other than calcifications.  There is an unusual stranding within the peritoneal fat of the omentum hand in the anterior lower abdomen and pelvis. Within the regions of stranding, there is hyperdense linear areas most likely calcification. There is a small amount of free fluid in the pelvis with the dependent layering calcification.  Bladder is within normal limits.  Uterus is absent.  Appendix is within normal limits.  Review of the MIP images confirms the above findings.  IMPRESSION: There is no evidence of thoracic or abdominal aortic aneurysm.  There is noted to be circumferential narrowing of the innominate artery but this narrowing is not significant. It is associated with significant wall thickening. This may simply be due to atherosclerosis, however vasculitis is in the differential diagnosis including mycotic disorder.  There is mild abnormal mediastinal adenopathy and hilar  adenopathy. Inflammatory and malignant etiology are not excluded such as metastatic disease, mycobacterium infection, lymphoma, sarcoidosis.  3 mm left lower lobe pulmonary nodule. If the patient is at high risk for bronchogenic carcinoma, follow-up chest CT at 1 year is recommended. If the patient is at low risk, no follow-up is needed. This recommendation follows the consensus statement: Guidelines for Management  of Small Pulmonary Nodules Detected on CT Scans: A Statement from the [employer] [employer] [place] as published in Radiology 2005; 237:395-400.  Within the abdomen, there is stranding and calcification in the omental fat worrisome for peritoneal carcinomatosis. There is a small amount of free fluid with dependent calcification.  Indeterminate 4.4 cm right adnexal lesion. Pelvic ultrasound is recommended to further characterize. The uterus is notably absent.   Electronically Signed   By: Maryclare Bean M.D.   On: 06/28/2014 13:19   Ct Cta Abd/pel W/cm &/or W/o Cm  06/28/2014   CLINICAL DATA:  Low back pain for 2 weeks. Short of breath. Evaluate for dissection.  EXAM: CT ANGIOGRAPHY CHEST, ABDOMEN AND PELVIS  TECHNIQUE: Multidetector CT imaging through the chest, abdomen and pelvis was performed using the standard protocol during bolus administration of intravenous contrast. Multiplanar reconstructed images and MIPs were obtained and reviewed to evaluate the vascular anatomy.  CONTRAST:  156mL OMNIPAQUE IOHEXOL 350 MG/ML SOLN  COMPARISON:  None.  FINDINGS: CTA CHEST FINDINGS  There is no evidence of aortic dissection or intramural hematoma. There is no obvious filling defect in the pulmonary arterial tree.  There is circumferential wall thickening of the innominate artery resulting in mild narrowing. Little if any irregular plaque or atherosclerotic calcification is present in the thorax. Postoperative changes from left hemithyroidectomy are noted.  Several scattered mediastinal nodes are present. 1.6 cm short axis  diameter paratracheal node on image 30. 1.1 cm right hilar node on image 47. 9 mm left hilar lymph node on image 45.  No pneumothorax.  No pleural effusion.  Dependent atelectasis bilaterally with low volumes. 3 mm left lower lobe pulmonary nodule on image 43.  Review of the MIP images confirms the above findings.  CTA ABDOMEN AND PELVIS FINDINGS  There is no evidence of aortic aneurysm or aortic dissection.  Celiac is patent. Branch vessels are patent. There is mild narrowing at the origin of the SMA. Branch vessels are grossly patent.  IMA is diminutive and patent.  Branch vessels are grossly patent.  Single renal arteries are patent.  Bilateral common, internal, and external iliac arteries are patent. Mild atherosclerotic changes with calcification in the bilateral internal iliac arteries.  Liver, gallbladder, spleen, pancreas, adrenal glands, and kidneys are within normal limits.  No abnormal retroperitoneal adenopathy.  There is an indeterminate lesion in the right adnexa measuring 4.4 cm with elevated Hounsfield unit measurements. Left adnexa is unremarkable other than calcifications.  There is an unusual stranding within the peritoneal fat of the omentum hand in the anterior lower abdomen and pelvis. Within the regions of stranding, there is hyperdense linear areas most likely calcification. There is a small amount of free fluid in the pelvis with the dependent layering calcification.  Bladder is within normal limits.  Uterus is absent.  Appendix is within normal limits.  Review of the MIP images confirms the above findings.  IMPRESSION: There is no evidence of thoracic or abdominal aortic aneurysm.  There is noted to be circumferential narrowing of the innominate artery but this narrowing is not significant. It is associated with significant wall thickening. This may simply be due to atherosclerosis, however vasculitis is in the differential diagnosis including mycotic disorder.  There is mild abnormal  mediastinal adenopathy and hilar adenopathy. Inflammatory and malignant etiology are not excluded such as metastatic disease, mycobacterium infection, lymphoma, sarcoidosis.  3 mm left lower lobe pulmonary nodule. If the patient is at high risk for bronchogenic carcinoma, follow-up chest CT at 1  year is recommended. If the patient is at low risk, no follow-up is needed. This recommendation follows the consensus statement: Guidelines for Management of Small Pulmonary Nodules Detected on CT Scans: A Statement from the [employer] as published in Radiology 2005; 237:395-400.  Within the abdomen, there is stranding and calcification in the omental fat worrisome for peritoneal carcinomatosis. There is a small amount of free fluid with dependent calcification.  Indeterminate 4.4 cm right adnexal lesion. Pelvic ultrasound is recommended to further characterize. The uterus is notably absent.   Electronically Signed   By: Maryclare Bean M.D.   On: 06/28/2014 13:19    Microbiology: Recent Results (from the past 240 hour(s))  MRSA PCR Screening     Status: None   Collection Time: 06/28/14  6:13 PM  Result Value Ref Range Status   MRSA by PCR NEGATIVE NEGATIVE Final    Comment:        The GeneXpert MRSA Assay (FDA approved for NASAL specimens only), is one component of a comprehensive MRSA colonization surveillance program. It is not intended to diagnose MRSA infection nor to guide or monitor treatment for MRSA infections.      Labs: Basic Metabolic Panel:  Recent Labs Lab 06/28/14 1114 06/29/14 0407  NA 136 138  K 3.5 3.2*  CL 104 104  CO2 24 26  GLUCOSE 117* 126*  BUN 13 11  CREATININE 0.95 0.92  CALCIUM 8.8 8.3*   Liver Function Tests:  Recent Labs Lab 06/29/14 0407  AST 29  ALT 15  ALKPHOS 50  BILITOT 1.1  PROT 7.9  ALBUMIN 3.6   No results for input(s): LIPASE, AMYLASE in the last 168 hours. No results for input(s): AMMONIA in the last 168 hours. CBC:  Recent  Labs Lab 06/28/14 1114 06/29/14 0407  WBC 8.1 9.3  NEUTROABS 5.0  --   HGB 13.5 12.5  HCT 39.3 38.0  MCV 89.1 90.7  PLT 339 327   Cardiac Enzymes:  Recent Labs Lab 06/28/14 1114  TROPONINI 0.03   BNP: BNP (last 3 results) No results for input(s): PROBNP in the last 8760 hours. CBG:  Recent Labs Lab 06/29/14 0826 06/30/14 0758 07/01/14 0740  GLUCAP 101* 117* 110*    Time coordinating discharge: Over 30 minutes

## 2014-07-07 ENCOUNTER — Ambulatory Visit: Payer: No Typology Code available for payment source | Attending: Physician Assistant

## 2014-07-08 ENCOUNTER — Encounter: Payer: Self-pay | Admitting: Gynecologic Oncology

## 2014-07-08 ENCOUNTER — Encounter: Payer: Self-pay | Admitting: Internal Medicine

## 2014-07-08 ENCOUNTER — Ambulatory Visit: Payer: Medicaid Other | Attending: Internal Medicine | Admitting: Internal Medicine

## 2014-07-08 ENCOUNTER — Ambulatory Visit (HOSPITAL_BASED_OUTPATIENT_CLINIC_OR_DEPARTMENT_OTHER): Payer: Medicaid Other | Admitting: Gynecologic Oncology

## 2014-07-08 VITALS — BP 192/112 | HR 78 | Temp 98.6°F | Resp 18 | Ht 65.0 in | Wt 220.7 lb

## 2014-07-08 VITALS — BP 174/113 | HR 73 | Temp 98.6°F | Resp 18 | Ht 65.0 in | Wt 221.0 lb

## 2014-07-08 DIAGNOSIS — J45909 Unspecified asthma, uncomplicated: Secondary | ICD-10-CM | POA: Diagnosis not present

## 2014-07-08 DIAGNOSIS — M545 Low back pain: Secondary | ICD-10-CM

## 2014-07-08 DIAGNOSIS — R938 Abnormal findings on diagnostic imaging of other specified body structures: Secondary | ICD-10-CM

## 2014-07-08 DIAGNOSIS — D280 Benign neoplasm of vulva: Secondary | ICD-10-CM | POA: Insufficient documentation

## 2014-07-08 DIAGNOSIS — Z79899 Other long term (current) drug therapy: Secondary | ICD-10-CM | POA: Insufficient documentation

## 2014-07-08 DIAGNOSIS — I10 Essential (primary) hypertension: Secondary | ICD-10-CM | POA: Insufficient documentation

## 2014-07-08 DIAGNOSIS — N839 Noninflammatory disorder of ovary, fallopian tube and broad ligament, unspecified: Secondary | ICD-10-CM | POA: Insufficient documentation

## 2014-07-08 DIAGNOSIS — R911 Solitary pulmonary nodule: Secondary | ICD-10-CM

## 2014-07-08 DIAGNOSIS — R971 Elevated cancer antigen 125 [CA 125]: Secondary | ICD-10-CM | POA: Diagnosis not present

## 2014-07-08 DIAGNOSIS — N8320 Unspecified ovarian cysts: Secondary | ICD-10-CM | POA: Diagnosis not present

## 2014-07-08 DIAGNOSIS — C561 Malignant neoplasm of right ovary: Secondary | ICD-10-CM | POA: Insufficient documentation

## 2014-07-08 DIAGNOSIS — R030 Elevated blood-pressure reading, without diagnosis of hypertension: Secondary | ICD-10-CM | POA: Diagnosis not present

## 2014-07-08 DIAGNOSIS — N83299 Other ovarian cyst, unspecified side: Secondary | ICD-10-CM

## 2014-07-08 LAB — LIPID PANEL
Cholesterol: 247 mg/dL — ABNORMAL HIGH (ref 0–200)
HDL: 42 mg/dL (ref >39)
LDL Cholesterol: 175 mg/dL — ABNORMAL HIGH (ref 0–99)
Total CHOL/HDL Ratio: 5.9 Ratio
Triglycerides: 151 mg/dL — ABNORMAL HIGH (ref <150)
VLDL: 30 mg/dL (ref 0–40)

## 2014-07-08 LAB — COMPLETE METABOLIC PANEL WITH GFR
ALT: 9 U/L (ref 0–35)
AST: 14 U/L (ref 0–37)
Albumin: 4.1 g/dL (ref 3.5–5.2)
Alkaline Phosphatase: 51 U/L (ref 39–117)
BUN: 16 mg/dL (ref 6–23)
CHLORIDE: 102 meq/L (ref 96–112)
CO2: 27 mEq/L (ref 19–32)
Calcium: 10.4 mg/dL (ref 8.4–10.5)
Creat: 1.05 mg/dL (ref 0.50–1.10)
GFR, EST AFRICAN AMERICAN: 73 mL/min
GFR, EST NON AFRICAN AMERICAN: 63 mL/min
GLUCOSE: 106 mg/dL — AB (ref 70–99)
Potassium: 5 mEq/L (ref 3.5–5.3)
Sodium: 142 mEq/L (ref 135–145)
Total Bilirubin: 0.6 mg/dL (ref 0.2–1.2)
Total Protein: 8 g/dL (ref 6.0–8.3)

## 2014-07-08 MED ORDER — LISINOPRIL-HYDROCHLOROTHIAZIDE 10-12.5 MG PO TABS: 1.0000 | ORAL_TABLET | Freq: Every day | ORAL | Status: DC

## 2014-07-08 NOTE — Progress Notes (Signed)
49 year old female with history of hypertension who presented to [facility] ED with ongoing back pain for past week or so. Pain is occasionally sharp and radiates down to legs. No difficulty ambulating just pain which usually is 5-6/10 in intensity and not relieved with rest. It is little better with pain meds given in ED. No loss of sensation and loss of strength in legs. No falls. No other complaints of nausea, vomiting or abdominal pain. No chest pain, shortness of breath or palpitations. No fevers or cough.  In ED, pt was found to have BP of 268/148 which improved to 184/102 with labetalol 20 mg IV once and Cardene drip. Her blood work was essentially unremarkable. CT chest and abdomen showed mild abnormal mediastinal adenopathy and hilar adenopathy. Inflammatory and malignant etiology are not excluded such as metastatic disease, mycobacterium infection, lymphoma, sarcoidosis. There is a 3 mm left lower lobe pulmonary nodule and recommendations is to repeat CT scan in 1 year if pt at risk of bronchogenic carcinoma. She is not a smoker and denies second hand exposure to smoking. In regards to abdominal imaging studies within the abdomen, there is stranding and calcification in the omental fat worrisome for peritoneal carcinomatosis and indeterminate 4.4 cm right adnexal lesion. Pelvic ultrasound is recommended to further characterize. The uterus is notably absent.   She was admitted to SDU due to hypertensive urgency and requirement for Cardene drip.   Pt here to establish care s/p HTN crisis, Adnexal mass Pt is being seen by [facility] Oncology- seen today for scheduled surgery 2/18 Need treated for uncontrolled BP-  BP- 174/113 73,asymptomatic  C/o back pain- states Oxycodone causing constipation

## 2014-07-08 NOTE — Patient Instructions (Addendum)
Preparing for your Surgery  Plan for surgery on February 18 with Dr. Skeet Latch.  Pre-operative Testing -You will receive a phone call from presurgical testing at [facility] to arrange for a pre-operative testing appointment before your surgery.  This appointment normally occurs one to two weeks before your scheduled surgery.   -Bring your insurance card, copy of an advanced directive if applicable, medication list  -At that visit, you will be asked to sign a consent for a possible blood transfusion in case a transfusion becomes necessary during surgery.  The need for a blood transfusion is rare but having consent is a necessary part of your care.     -You should not be taking blood thinners or aspirin at least ten days prior to surgery unless instructed by your surgeon.  Day Before Surgery at [place] will be asked to take in only clear liquids the day before surgery.  Examples of clear liquids include broths, jello, and clear juices. You will be advised to have nothing to eat or drink after midnight the evening before.    Your role in recovery Your role is to become active as soon as directed by your doctor, while still giving yourself time to heal.  Rest when you feel tired. You will be asked to do the following in order to speed your recovery:  - Cough and breathe deeply. This helps toclear and expand your lungs and can prevent pneumonia. You may be given a spirometer to practice deep breathing. A staff member will show you how to use the spirometer. - Do mild physical activity. Walking or moving your legs help your circulation and body functions return to normal. A staff member will help you when you try to walk and will provide you with simple exercises. Do not try to get up or walk alone the first time. - Actively manage your pain. Managing your pain lets you move in comfort. We will ask you to rate your pain on a scale of zero to 10. It is your responsibility to  tell your doctor or nurse where and how much you hurt so your pain can be treated.  Special Considerations -If you are diabetic, you may be placed on insulin after surgery to have closer control over your blood sugars to promote healing and recovery.  This does not mean that you will be discharged on insulin.  If applicable, your oral antidiabetics will be resumed when you are tolerating a solid diet.  -Your final pathology results from surgery should be available by the Friday after surgery and the results will be relayed to you when available.    Blood Transfusion Information WHAT IS A BLOOD TRANSFUSION? A transfusion is the replacement of blood or some of its parts. Blood is made up of multiple cells which provide different functions.  Red blood cells carry oxygen and are used for blood loss replacement.  White blood cells fight against infection.  Platelets control bleeding.  Plasma helps clot blood.  Other blood products are available for specialized needs, such as hemophilia or other clotting disorders. BEFORE THE TRANSFUSION  Who gives blood for transfusions?   You may be able to donate blood to be used at a later date on yourself (autologous donation).  Relatives can be asked to donate blood. This is generally not any safer than if you have received blood from a stranger. The same precautions are taken to ensure safety when a relative's blood is donated.  Healthy volunteers who are  fully evaluated to make sure their blood is safe. This is blood bank blood. Transfusion therapy is the safest it has ever been in the practice of medicine. Before blood is taken from a donor, a complete history is taken to make sure that person has no history of diseases nor engages in risky social behavior (examples are intravenous drug use or sexual activity with multiple partners). The donor's travel history is screened to minimize risk of transmitting infections, such as malaria. The donated blood  is tested for signs of infectious diseases, such as HIV and hepatitis. The blood is then tested to be sure it is compatible with you in order to minimize the chance of a transfusion reaction. If you or a relative donates blood, this is often done in anticipation of surgery and is not appropriate for emergency situations. It takes many days to process the donated blood. RISKS AND COMPLICATIONS Although transfusion therapy is very safe and saves many lives, the main dangers of transfusion include:   Getting an infectious disease.  Developing a transfusion reaction. This is an allergic reaction to something in the blood you were given. Every precaution is taken to prevent this. The decision to have a blood transfusion has been considered carefully by your caregiver before blood is given. Blood is not given unless the benefits outweigh the risks.

## 2014-07-08 NOTE — Progress Notes (Signed)
Patient ID: Sarah [name], female   DOB: 08-31-1965, 49 y.o.   MRN: [id number]   Sarah [name], is a 49 y.o. female  EXH:[id number]  FYB:[id number]  DOB - 1965/12/14  CC:  Chief Complaint  Patient presents with  . Hospitalization Follow-up  . Mass       HPI: Sarah [name] is a 49 y.o. female here today to establish medical care. Patient has history of hypertension and asthma, recently seen in the ED for ongoing back pain for about a week, was evaluated and admitted for hypertensive emergency. "CT chest and abdomen showed mild abnormal mediastinal adenopathy and hilar adenopathy. In regards to abdominal imaging studies within the abdomen, there is stranding and calcification in the omental fat worrisome for peritoneal carcinomatosis and indeterminate 4.4 cm right adnexal lesion. The uterus is notably absent. Pelvic US showed complex right ovarian mass or cyst, as on recent CT scan. Clearly given the omental caking this is the primary site of concern and ovarian carcinoma is suspected. Unable to demonstrate the left ovary sonographically". Patient was seen by GYN oncologist. Debulking surgery tentatively planned for 07/26/2014. Patient is here today for hospital discharge follow-up. She was discharged on amlodipine 10 mg tablet by mouth daily andmetoprolol 25 mg tablet by mouth twice a day for hypertension. Blood pressure today is high. Patient has no complaint, reports no side effects to medication. Patient does not smoke cigarettes, she does not drink alcohol. Patient has No headache, No chest pain, No abdominal pain - No Nausea, No new weakness tingling or numbness. She coughs occasionally, productive of whitish sputum, currently using cough syrup which is helping. No leg swelling.  No Known Allergies Past Medical History  Diagnosis Date  . Asthma   . Hypertension   . Glucagonoma    Current Outpatient Prescriptions on File Prior to Visit  Medication Sig Dispense Refill    . amLODipine (NORVASC) 10 MG tablet Take 1 tablet (10 mg total) by mouth daily. 30 tablet 0  . metoprolol tartrate (LOPRESSOR) 25 MG tablet Take 0.5 tablets (12.5 mg total) by mouth 2 (two) times daily. 60 tablet 0  . acetaminophen (TYLENOL) 500 MG tablet Take 1,000 mg by mouth every 6 (six) hours as needed for mild pain or headache.    . oxyCODONE-acetaminophen (PERCOCET/ROXICET) 5-325 MG per tablet Take 1 tablet by mouth every 4 (four) hours as needed for moderate pain. (Patient not taking: Reported on 07/08/2014) 45 tablet 0   No current facility-administered medications on file prior to visit.   Family History  Problem Relation Age of Onset  . Hypertension Mother   . Hypertension Father   . Diabetes Father    History   Social History  . Marital Status: Married    Spouse Name: N/A    Number of Children: N/A  . Years of Education: N/A   Occupational History  . Not on file.   Social History Main Topics  . Smoking status: Never Smoker   . Smokeless tobacco: Never Used  . Alcohol Use: No  . Drug Use: No  . Sexual Activity: Not on file   Other Topics Concern  . Not on file   Social History Narrative    Review of Systems: Constitutional: Negative for fever, chills, diaphoresis, activity change, appetite change and fatigue. HENT: Negative for ear pain, nosebleeds, congestion, facial swelling, rhinorrhea, neck pain, neck stiffness and ear discharge.  Eyes: Negative for pain, discharge, redness, itching and visual disturbance. Respiratory: Negative for cough, choking, chest tightness, shortness  of breath, wheezing and stridor.  Cardiovascular: Negative for chest pain, palpitations and leg swelling. Gastrointestinal: Negative for abdominal distention. Genitourinary: Negative for dysuria, urgency, frequency, hematuria, flank pain, decreased urine volume, difficulty urinating and dyspareunia.  Musculoskeletal: +++ back pain, no joint swelling, arthralgia and gait  problem. Neurological: Negative for dizziness, tremors, seizures, syncope, facial asymmetry, speech difficulty, weakness, light-headedness, numbness and headaches.  Hematological: Negative for adenopathy. Does not bruise/bleed easily. Psychiatric/Behavioral: Negative for hallucinations, behavioral problems, confusion, dysphoric mood, decreased concentration and agitation.    Objective:   Filed Vitals:   07/08/14 1155  BP: 174/113  Pulse: 73  Temp: 98.6 F (37 C)  Resp: 18    Physical Exam: Constitutional: Patient appears well-developed and well-nourished. No distress. Obese HENT: Normocephalic, atraumatic, External right and left ear normal. Oropharynx is clear and moist.  Eyes: Conjunctivae and EOM are normal. PERRLA, no scleral icterus. Neck: Normal ROM. Neck supple. No JVD. No tracheal deviation. No thyromegaly. CVS: RRR, S1/S2 +, no murmurs, no gallops, no carotid bruit.  Pulmonary: Effort and breath sounds normal, no stridor, rhonchi, wheezes, rales.  Abdominal: Soft. BS +, no distension, tenderness, rebound or guarding.  Musculoskeletal: Normal range of motion. No edema and no tenderness.  Lymphadenopathy: No lymphadenopathy noted, cervical, inguinal or axillary Neuro: Alert. Normal reflexes, muscle tone coordination. No cranial nerve deficit. Skin: Skin is warm and dry. No rash noted. Not diaphoretic. No erythema. No pallor. Psychiatric: Normal mood and affect. Behavior, judgment, thought content normal.  Lab Results  Component Value Date   WBC 9.3 06/29/2014   HGB 12.5 06/29/2014   HCT 38.0 06/29/2014   MCV 90.7 06/29/2014   PLT 327 06/29/2014   Lab Results  Component Value Date   CREATININE 0.92 06/29/2014   BUN 11 06/29/2014   NA 138 06/29/2014   K 3.2* 06/29/2014   CL 104 06/29/2014   CO2 26 06/29/2014    No results found for: HGBA1C Lipid Panel  No results found for: CHOL, TRIG, HDL, CHOLHDL, VLDL, LDLCALC     Assessment and plan:   1. Essential  hypertension: Uncontrolled  - I will add lisinopril-hydrochlorothiazide to current regimen - Continue amlodipine and metoprolol as prescribed  - lisinopril-hydrochlorothiazide (PRINZIDE,ZESTORETIC) 10-12.5 MG per tablet; Take 1 tablet by mouth daily.  Dispense: 90 tablet; Refill: 3  - COMPLETE METABOLIC PANEL WITH GFR - POCT glycosylated hemoglobin (Hb A1C) - Lipid panel - DASH Diet - We have discussed blood pressure goals, patient is to report to the clinic for a walk-in visit if blood pressure is persistently above 140/90 mmHg - Patient is to record ambulatory blood pressure and bring to clinic in one week.  2. Ovarian cancer, right  Follow-up with gynecology-oncologist as scheduled  Patient was counseled extensively about nutrition and exercise. Return in about 1 week (around 07/15/2014) for BP Check, Nurse Visit.  The patient was given clear instructions to go to ER or return to medical center if symptoms don't improve, worsen or new problems develop. The patient verbalized understanding. The patient was told to call to get lab results if they haven't heard anything in the next week.     This note has been created with Surveyor, quantity. Any transcriptional errors are unintentional.    Angelica Chessman, MD, [place], [place], [place], [place]   07/08/2014, 12:05 PM

## 2014-07-08 NOTE — Progress Notes (Signed)
GYN ONC FOLLOWUP VISIT:  CC:  Chief Complaint  Patient presents with  . Back Pain    HPI:  Patient is a 49 year old gravida 2 para 2 who was was seen by my partner Dr Nancy Marus after being admitted from the emergency room when she presented with ongoing back pain. She states that she has had low back pain. Progressively worse over the past 2-3 months. She states the pain associated with radiation down her legs and occasionally so severe that she cannot walk secondary to pain. She denies any neuropathy. She was seen in the emergency room she was found have a blood pressure of 260/148 which improved 184/102 with 20 mg of IV labetalol 1 and a Cardene drip. Blood work is essentially unremarkable. Imaging was performed while in the emergency room that revealed:  FINDINGS: CTA CHEST FINDINGS  There is no evidence of aortic dissection or intramural hematoma.There is no obvious filling defect in the pulmonary arterial tree.  There is circumferential wall thickening of the innominate artery resulting in mild narrowing. Little if any irregular plaque or atherosclerotic calcification is present in the thorax. Postoperative changes from left hemithyroidectomy are noted.  Several scattered mediastinal nodes are present. 1.6 cm short axis diameter paratracheal node on image 30. 1.1 cm right hilar node on image 47. 9 mm left hilar lymph node on image 45.  No pneumothorax. No pleural effusion.  Dependent atelectasis bilaterally with low volumes. 3 mm left lower lobe pulmonary nodule on image 43.  Review of the MIP images confirms the above findings.  CTA ABDOMEN AND PELVIS FINDINGS  There is no evidence of aortic aneurysm or aortic dissection.  Celiac is patent. Branch vessels are patent. There is mild narrowing at the origin of the SMA. Branch vessels are grossly patent.  IMA is diminutive and patent. Branch vessels are grossly patent.  Single renal arteries are  patent.  Bilateral common, internal, and external iliac arteries are patent. Mild atherosclerotic changes with calcification in the bilateral internal iliac arteries.  Liver, gallbladder, spleen, pancreas, adrenal glands, and kidneys are within normal limits.  No abnormal retroperitoneal adenopathy.  There is an indeterminate lesion in the right adnexa measuring 4.4 cm with elevated Hounsfield unit measurements. Left adnexa is unremarkable other than calcifications.  There is an unusual stranding within the peritoneal fat of the omentum hand in the anterior lower abdomen and pelvis. Within the regions of stranding, there is hyperdense linear areas most likely calcification. There is a small amount of free fluid in the pelvis with the dependent layering calcification.  Bladder is within normal limits. Uterus is absent.  Appendix is within normal limits.  Review of the MIP images confirms the above findings.  IMPRESSION: There is no evidence of thoracic or abdominal aortic aneurysm.  There is noted to be circumferential narrowing of the innominate artery but this narrowing is not significant. It is associated with significant wall thickening. This may simply be due to atherosclerosis, however vasculitis is in the differential diagnosis including mycotic disorder.  There is mild abnormal mediastinal adenopathy and hilar adenopathy. Inflammatory and malignant etiology are not excluded such as metastatic disease, mycobacterium infection, lymphoma, sarcoidosis.  3 mm left lower lobe pulmonary nodule. If the patient is at high risk for bronchogenic carcinoma, follow-up chest CT at 1 year is recommended. If the patient is at low risk, no follow-up is needed.  Within the abdomen, there is stranding and calcification in the omental fat worrisome for peritoneal carcinomatosis. There is a  small amount of free fluid with dependent calcification.  Indeterminate 4.4 cm right  adnexal lesion. Pelvic ultrasound is recommended to further characterize. The uterus is notably absent.  Ultrasound:  Right ovary  Measurements: 4.6 by 5.2 by 3.8 cm. Abnormal complex hypo echogenicity throughout most of the structure concerning for ovarian mass. This seems somewhat infiltrative and is difficult to separate from the adjacent ovarian parenchyma, but a portion of but measures 2.8 cm on image 54.  IMPRESSION: 1. Complex right ovarian mass or cyst, as on recent CT scan. Clearly given the omental caking this is the primary site of concern and ovarian carcinoma is suspected. 2. We were unable to demonstrate the left ovary sonographically.   CA 125 was 58 (06/30/14)  The patient states that she has had some periodic sharp abdominal pains over the past 2-3 months. She believes that they're "gas pains". They never wake her up at night but the back pain does wake her up at night. She denies any nausea vomiting at home though she did have some Tuesday while in the hospital. Additionally she does not endorse any early satiety except while here in the hospital. She denies any significant change in her bowel or bladder habits. However, she states that for the past month she has 5-6 loose bowel movements per day. She had a hysterectomy proximal me 10 years ago by Dr. Warnell Forester for a fibroid uterus. She's never had a colonoscopy. She had a mammogram 3 years ago that she states was unremarkable. She has 2 maternal great aunts he may have had cancer she's not sure what type they had. There is no history of breast cancer. She has 2 sons age 74 and 82. 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 She denies use of tobacco.  Review of Systems  Constitutional: Denies fever. 15 pound weight gain over the past 2-3 years Skin: No rash,  Cardiovascular: No chest pain, + shortness of breath, no edema. The patient states that she thinks she could go up a flight of stairs for which patient  would've breath at the top. Pulmonary: + productive cough, yellow sputum.  Gastro Intestinal: Reporting intermittent lower abdominal soreness as above. +diarrhea reported. No bright red blood per rectum  Genitourinary: No frequency, urgency, or dysuria. Denies vaginal bleeding and discharge.  Musculoskeletal: + back pain radiating down legs, no neuropathy. Neurologic: No weakness, numbness, or change in gait.   Right ovary  Measurements: 4.6 by 5.2 by 3.8 cm. Abnormal complex hypo echogenicity throughout most of the structure concerning for ovarian mass. This seems somewhat infiltrative and is difficult to separate from the adjacent ovarian parenchyma, but a portion of but measures 2.8 cm on image 54.  Left ovary  Not visible sonographically.  Other findings  No free fluid.  IMPRESSION: 1. Complex right ovarian mass or cyst, as on recent CT scan. Clearly given the omental caking this is the primary site of concern and ovarian carcinoma is suspected. 2. We were unable to demonstrate the left ovary sonographically.  CA-125 was drawn that was 58. It is for this reason that GYN oncology is asked to see her today  Current Meds:  Current Outpatient Prescriptions on File Prior to Visit  Medication Sig Dispense Refill  . acetaminophen (TYLENOL) 500 MG tablet Take 1,000 mg by mouth every 6 (six) hours as needed for mild pain or headache.    Marland Kitchen amLODipine (NORVASC) 10 MG tablet Take 1 tablet (10 mg total) by mouth daily. [place]  tablet 0  . metoprolol tartrate (LOPRESSOR) 25 MG tablet Take 0.5 tablets (12.5 mg total) by mouth 2 (two) times daily. 60 tablet 0  . oxyCODONE-acetaminophen (PERCOCET/ROXICET) 5-325 MG per tablet Take 1 tablet by mouth every 4 (four) hours as needed for moderate pain. (Patient not taking: Reported on 07/08/2014) 45 tablet 0   No current facility-administered medications on file prior to visit.    Allergy: No Known Allergies  Social Hx:  History    Social History  . Marital Status: Married    Spouse Name: N/A    Number of Children: N/A  . Years of Education: N/A   Occupational History  . Not on file.   Social History Main Topics  . Smoking status: Never Smoker   . Smokeless tobacco: Never Used  . Alcohol Use: No  . Drug Use: No  . Sexual Activity: Not on file   Other Topics Concern  . Not on file   Social History Narrative    Past Surgical Hx:  Past Surgical History  Procedure Laterality Date  . Abdominal hysterectomy    . Thyroidectomy, partial      Past Medical Hx:  Past Medical History  Diagnosis Date  . Asthma   . Hypertension   . Glucagonoma     Oncology Hx:   No history exists.    Family Hx:  Family History  Problem Relation Age of Onset  . Hypertension Mother   . Hypertension Father   . Diabetes Father     Vitals: Blood pressure 157/82, pulse 88, temperature 97.9 F (36.6 C), temperature source Oral, resp. rate 20, height 5\' 5"  (1.651 m), weight 224 lb 3.3 oz (101.7 kg), SpO2 100 %.  Physical Exam: General: no distress HEENT: normal no lesions  Neck: no masses Pulm: CTAB, no added sounds CVS: RRR, no murmurs or gallops Abd: soft, overweight, nontender, no palpable fluid wave, no hernias. Pelvic: normal external female genitalia, surgically absent cervix (though there is a small piece of redundant vaginal tissue that may represent residual cervix), Some nodularity is appreciated in the right adnexa but not a discrete mass. Rectal exam: no RV septal nodularity appreciated. Normal tone. Extremities: no edema  Assessment/Plan: 49 year old who was admitted for hypertensive emergency. At the time she was found to have a thickened omentum, a right ovarian mass, and a slightly elevated CA-125. Based on the findings of the CT scan, ultrasound, and CA-125 we recommend proceeding with a diagnostic  laparoscopy and right salpingo-oophorectomy via either robotic or laparotomy approach pending peritoneal findings. She is amenable to this. She understands that the mass will be sent for frozen section. Should the mass be benign she would like to have the contralateral ovary removed. Should there be evidence of malignancy she will either have surgical debulking done via laparoscopy or if possible if not she'll undergo exploratory laparotomy and debulking surgery. She knows that Dr. Janie Morning will be her surgery and we will tentatively plan for surgery for February 18. However, she requires optimization of her blood pressure which today is still not adequately controlled. She hasn't appointment with the [facility] today and I provided them with a note requesting optimization of her blood pressure prior to every 18th. Donaciano Eva, MD

## 2014-07-08 NOTE — Patient Instructions (Signed)
DASH Eating Plan DASH stands for "Dietary Approaches to Stop Hypertension." The DASH eating plan is a healthy eating plan that has been shown to reduce high blood pressure (hypertension). Additional health benefits may include reducing the risk of type 2 diabetes mellitus, heart disease, and stroke. The DASH eating plan may also help with weight loss. WHAT DO I NEED TO KNOW ABOUT THE DASH EATING PLAN? For the DASH eating plan, you will follow these general guidelines:  Choose foods with a percent daily value for sodium of less than 5% (as listed on the food label).  Use salt-free seasonings or herbs instead of table salt or sea salt.  Check with your health care provider or pharmacist before using salt substitutes.  Eat lower-sodium products, often labeled as "lower sodium" or "no salt added."  Eat fresh foods.  Eat more vegetables, fruits, and low-fat dairy products.  Choose whole grains. Look for the word "whole" as the first word in the ingredient list.  Choose fish and skinless chicken or turkey more often than red meat. Limit fish, poultry, and meat to 6 oz (170 g) each day.  Limit sweets, desserts, sugars, and sugary drinks.  Choose heart-healthy fats.  Limit cheese to 1 oz (28 g) per day.  Eat more home-cooked food and less restaurant, buffet, and fast food.  Limit fried foods.  Cook foods using methods other than frying.  Limit canned vegetables. If you do use them, rinse them well to decrease the sodium.  When eating at a restaurant, ask that your food be prepared with less salt, or no salt if possible. WHAT FOODS CAN I EAT? Seek help from a dietitian for individual calorie needs. Grains Whole grain or whole wheat bread. Brown rice. Whole grain or whole wheat pasta. Quinoa, bulgur, and whole grain cereals. Low-sodium cereals. Corn or whole wheat flour tortillas. Whole grain cornbread. Whole grain crackers. Low-sodium crackers. Vegetables Fresh or frozen vegetables  (raw, steamed, roasted, or grilled). Low-sodium or reduced-sodium tomato and vegetable juices. Low-sodium or reduced-sodium tomato sauce and paste. Low-sodium or reduced-sodium canned vegetables.  Fruits All fresh, canned (in natural juice), or frozen fruits. Meat and Other Protein Products Ground beef (85% or leaner), grass-fed beef, or beef trimmed of fat. Skinless chicken or turkey. Ground chicken or turkey. Pork trimmed of fat. All fish and seafood. Eggs. Dried beans, peas, or lentils. Unsalted nuts and seeds. Unsalted canned beans. Dairy Low-fat dairy products, such as skim or 1% milk, 2% or reduced-fat cheeses, low-fat ricotta or cottage cheese, or plain low-fat yogurt. Low-sodium or reduced-sodium cheeses. Fats and Oils Tub margarines without trans fats. Light or reduced-fat mayonnaise and salad dressings (reduced sodium). Avocado. Safflower, olive, or canola oils. Natural peanut or almond butter. Other Unsalted popcorn and pretzels. The items listed above may not be a complete list of recommended foods or beverages. Contact your dietitian for more options. WHAT FOODS ARE NOT RECOMMENDED? Grains White bread. White pasta. White rice. Refined cornbread. Bagels and croissants. Crackers that contain trans fat. Vegetables Creamed or fried vegetables. Vegetables in a cheese sauce. Regular canned vegetables. Regular canned tomato sauce and paste. Regular tomato and vegetable juices. Fruits Dried fruits. Canned fruit in light or heavy syrup. Fruit juice. Meat and Other Protein Products Fatty cuts of meat. Ribs, chicken wings, bacon, sausage, bologna, salami, chitterlings, fatback, hot dogs, bratwurst, and packaged luncheon meats. Salted nuts and seeds. Canned beans with salt. Dairy Whole or 2% milk, cream, half-and-half, and cream cheese. Whole-fat or sweetened yogurt. Full-fat   cheeses or blue cheese. Nondairy creamers and whipped toppings. Processed cheese, cheese spreads, or cheese  curds. Condiments Onion and garlic salt, seasoned salt, table salt, and sea salt. Canned and packaged gravies. Worcestershire sauce. Tartar sauce. Barbecue sauce. Teriyaki sauce. Soy sauce, including reduced sodium. Steak sauce. Fish sauce. Oyster sauce. Cocktail sauce. Horseradish. Ketchup and mustard. Meat flavorings and tenderizers. Bouillon cubes. Hot sauce. Tabasco sauce. Marinades. Taco seasonings. Relishes. Fats and Oils Butter, stick margarine, lard, shortening, ghee, and bacon fat. Coconut, palm kernel, or palm oils. Regular salad dressings. Other Pickles and olives. Salted popcorn and pretzels. The items listed above may not be a complete list of foods and beverages to avoid. Contact your dietitian for more information. WHERE CAN I FIND MORE INFORMATION? National Heart, Lung, and Blood Institute: www.nhlbi.nih.gov/health/health-topics/topics/dash/ Document Released: 05/16/2011 Document Revised: 10/11/2013 Document Reviewed: 03/31/2013 ExitCare Patient Information 2015 [employer]. This information is not intended to replace advice given to you by your health care provider. Make sure you discuss any questions you have with your health care provider. Hypertension Hypertension, commonly called high blood pressure, is when the force of blood pumping through your arteries is too strong. Your arteries are the blood vessels that carry blood from your heart throughout your body. A blood pressure reading consists of a higher number over a lower number, such as 110/72. The higher number (systolic) is the pressure inside your arteries when your heart pumps. The lower number (diastolic) is the pressure inside your arteries when your heart relaxes. Ideally you want your blood pressure below 120/80. Hypertension forces your heart to work harder to pump blood. Your arteries may become narrow or stiff. Having hypertension puts you at risk for heart disease, stroke, and other problems.  RISK  FACTORS Some risk factors for high blood pressure are controllable. Others are not.  Risk factors you cannot control include:   Race. You may be at higher risk if you are African American.  Age. Risk increases with age.  Gender. Men are at higher risk than women before age 45 years. After age 65, women are at higher risk than men. Risk factors you can control include:  Not getting enough exercise or physical activity.  Being overweight.  Getting too much fat, sugar, calories, or salt in your diet.  Drinking too much alcohol. SIGNS AND SYMPTOMS Hypertension does not usually cause signs or symptoms. Extremely high blood pressure (hypertensive crisis) may cause headache, anxiety, shortness of breath, and nosebleed. DIAGNOSIS  To check if you have hypertension, your health care provider will measure your blood pressure while you are seated, with your arm held at the level of your heart. It should be measured at least twice using the same arm. Certain conditions can cause a difference in blood pressure between your right and left arms. A blood pressure reading that is higher than normal on one occasion does not mean that you need treatment. If one blood pressure reading is high, ask your health care provider about having it checked again. TREATMENT  Treating high blood pressure includes making lifestyle changes and possibly taking medicine. Living a healthy lifestyle can help lower high blood pressure. You may need to change some of your habits. Lifestyle changes may include:  Following the DASH diet. This diet is high in fruits, vegetables, and whole grains. It is low in salt, red meat, and added sugars.  Getting at least 2 hours of brisk physical activity every week.  Losing weight if necessary.  Not smoking.  Limiting   alcoholic beverages.  Learning ways to reduce stress. If lifestyle changes are not enough to get your blood pressure under control, your health care provider may  prescribe medicine. You may need to take more than one. Work closely with your health care provider to understand the risks and benefits. HOME CARE INSTRUCTIONS  Have your blood pressure rechecked as directed by your health care provider.   Take medicines only as directed by your health care provider. Follow the directions carefully. Blood pressure medicines must be taken as prescribed. The medicine does not work as well when you skip doses. Skipping doses also puts you at risk for problems.   Do not smoke.   Monitor your blood pressure at home as directed by your health care provider. SEEK MEDICAL CARE IF:   You think you are having a reaction to medicines taken.  You have recurrent headaches or feel dizzy.  You have swelling in your ankles.  You have trouble with your vision. SEEK IMMEDIATE MEDICAL CARE IF:  You develop a severe headache or confusion.  You have unusual weakness, numbness, or feel faint.  You have severe chest or abdominal pain.  You vomit repeatedly.  You have trouble breathing. MAKE SURE YOU:   Understand these instructions.  Will watch your condition.  Will get help right away if you are not doing well or get worse. Document Released: 05/27/2005 Document Revised: 10/11/2013 Document Reviewed: 03/19/2013 ExitCare Patient Information 2015 [employer]. This information is not intended to replace advice given to you by your health care provider. Make sure you discuss any questions you have with your health care provider.  

## 2014-07-11 DIAGNOSIS — H409 Unspecified glaucoma: Secondary | ICD-10-CM

## 2014-07-11 HISTORY — DX: Unspecified glaucoma: H40.9

## 2014-07-15 ENCOUNTER — Telehealth: Payer: Self-pay | Admitting: Emergency Medicine

## 2014-07-15 MED ORDER — ATORVASTATIN CALCIUM 20 MG PO TABS: 20.0000 mg | ORAL_TABLET | Freq: Every day | ORAL | Status: DC

## 2014-07-15 NOTE — Telephone Encounter (Signed)
-----   Message from Tresa Garter, MD sent at 07/12/2014  5:25 PM EST ----- Please inform patient that his laboratory result shows very high cholesterol level. We need to treat with medication, we also advise dietary control with low cholesterol, low-fat diet and regular physical exercise at least 3 times a week 30 minutes each time.  Please call in prescription atorvastatin 20 mg tablet by mouth daily, 90 tablets with 3 refills.

## 2014-07-15 NOTE — Telephone Encounter (Signed)
Left message with lab results and medication instructions to start taking Atorvastatin 20 mg tab daily with physical activity/ low fat diet Medication e-scribed to [facility]

## 2014-07-18 ENCOUNTER — Ambulatory Visit: Payer: Medicaid Other | Attending: Internal Medicine | Admitting: *Deleted

## 2014-07-18 VITALS — BP 158/84 | HR 67 | Temp 98.2°F | Resp 18

## 2014-07-18 DIAGNOSIS — I10 Essential (primary) hypertension: Secondary | ICD-10-CM | POA: Insufficient documentation

## 2014-07-18 NOTE — Progress Notes (Signed)
Please put orders in Epic surgery 07-28-14 pre op 07-22-14 Thanks

## 2014-07-18 NOTE — Progress Notes (Signed)
Patient presents for BP check Med list reviewed; states taking all meds as directed States she is still using salt Discussed need for low sodium diet and using Mrs. Dash as alternative to salt Discussed walking 30 minutes per day for exercise Denies headaches, blurred vision, SHOB, chest pain or presseure  BP 158/84  left arm manually with large cuff P 67 R 18  T  98.2 oral SPO2  97%  Patient advised to call for med refills at least 7 days before running out so as not to go without. Patient aware that she is to f/u with PCP 3 months from last visit (Due 10/07/14)

## 2014-07-18 NOTE — Patient Instructions (Signed)
DASH Eating Plan °DASH stands for "Dietary Approaches to Stop Hypertension." The DASH eating plan is a healthy eating plan that has been shown to reduce high blood pressure (hypertension). Additional health benefits may include reducing the risk of type 2 diabetes mellitus, heart disease, and stroke. The DASH eating plan may also help with weight loss. °WHAT DO I NEED TO KNOW ABOUT THE DASH EATING PLAN? °For the DASH eating plan, you will follow these general guidelines: °· Choose foods with a percent daily value for sodium of less than 5% (as listed on the food label). °· Use salt-free seasonings or herbs instead of table salt or sea salt. °· Check with your health care provider or pharmacist before using salt substitutes. °· Eat lower-sodium products, often labeled as "lower sodium" or "no salt added." °· Eat fresh foods. °· Eat more vegetables, fruits, and low-fat dairy products. °· Choose whole grains. Look for the word "whole" as the first word in the ingredient list. °· Choose fish and skinless chicken or turkey more often than red meat. Limit fish, poultry, and meat to 6 oz (170 g) each day. °· Limit sweets, desserts, sugars, and sugary drinks. °· Choose heart-healthy fats. °· Limit cheese to 1 oz (28 g) per day. °· Eat more home-cooked food and less restaurant, buffet, and fast food. °· Limit fried foods. °· Cook foods using methods other than frying. °· Limit canned vegetables. If you do use them, rinse them well to decrease the sodium. °· When eating at a restaurant, ask that your food be prepared with less salt, or no salt if possible. °WHAT FOODS CAN I EAT? °Seek help from a dietitian for individual calorie needs. °Grains °Whole grain or whole wheat bread. Brown rice. Whole grain or whole wheat pasta. Quinoa, bulgur, and whole grain cereals. Low-sodium cereals. Corn or whole wheat flour tortillas. Whole grain cornbread. Whole grain crackers. Low-sodium crackers. °Vegetables °Fresh or frozen vegetables  (raw, steamed, roasted, or grilled). Low-sodium or reduced-sodium tomato and vegetable juices. Low-sodium or reduced-sodium tomato sauce and paste. Low-sodium or reduced-sodium canned vegetables.  °Fruits °All fresh, canned (in natural juice), or frozen fruits. °Meat and Other Protein Products °Ground beef (85% or leaner), grass-fed beef, or beef trimmed of fat. Skinless chicken or turkey. Ground chicken or turkey. Pork trimmed of fat. All fish and seafood. Eggs. Dried beans, peas, or lentils. Unsalted nuts and seeds. Unsalted canned beans. °Dairy °Low-fat dairy products, such as skim or 1% milk, 2% or reduced-fat cheeses, low-fat ricotta or cottage cheese, or plain low-fat yogurt. Low-sodium or reduced-sodium cheeses. °Fats and Oils °Tub margarines without trans fats. Light or reduced-fat mayonnaise and salad dressings (reduced sodium). Avocado. Safflower, olive, or canola oils. Natural peanut or almond butter. °Other °Unsalted popcorn and pretzels. °The items listed above may not be a complete list of recommended foods or beverages. Contact your dietitian for more options. °WHAT FOODS ARE NOT RECOMMENDED? °Grains °White bread. White pasta. White rice. Refined cornbread. Bagels and croissants. Crackers that contain trans fat. °Vegetables °Creamed or fried vegetables. Vegetables in a cheese sauce. Regular canned vegetables. Regular canned tomato sauce and paste. Regular tomato and vegetable juices. °Fruits °Dried fruits. Canned fruit in light or heavy syrup. Fruit juice. °Meat and Other Protein Products °Fatty cuts of meat. Ribs, chicken wings, bacon, sausage, bologna, salami, chitterlings, fatback, hot dogs, bratwurst, and packaged luncheon meats. Salted nuts and seeds. Canned beans with salt. °Dairy °Whole or 2% milk, cream, half-and-half, and cream cheese. Whole-fat or sweetened yogurt. Full-fat   cheeses or blue cheese. Nondairy creamers and whipped toppings. Processed cheese, cheese spreads, or cheese  curds. °Condiments °Onion and garlic salt, seasoned salt, table salt, and sea salt. Canned and packaged gravies. Worcestershire sauce. Tartar sauce. Barbecue sauce. Teriyaki sauce. Soy sauce, including reduced sodium. Steak sauce. Fish sauce. Oyster sauce. Cocktail sauce. Horseradish. Ketchup and mustard. Meat flavorings and tenderizers. Bouillon cubes. Hot sauce. Tabasco sauce. Marinades. Taco seasonings. Relishes. °Fats and Oils °Butter, stick margarine, lard, shortening, ghee, and bacon fat. Coconut, palm kernel, or palm oils. Regular salad dressings. °Other °Pickles and olives. Salted popcorn and pretzels. °The items listed above may not be a complete list of foods and beverages to avoid. Contact your dietitian for more information. °WHERE CAN I FIND MORE INFORMATION? °National Heart, Lung, and Blood Institute: www.nhlbi.nih.gov/health/health-topics/topics/dash/ °Document Released: 05/16/2011 Document Revised: 10/11/2013 Document Reviewed: 03/31/2013 °Exit[employer]® Patient Information ©2015 [employer]. This information is not intended to replace advice given to you by your health care provider. Make sure you discuss any questions you have with your health care provider. ° °

## 2014-07-20 NOTE — Patient Instructions (Addendum)
Sarah [name]  07/20/2014   Your procedure is scheduled on:   Report to [facility] Main  Entrance and follow signs to               [place] at AM.  Call this number if you have problems the morning of surgery [phone]   Remember:  Do not eat food or drink liquids :After Midnight.     Take these medicines the morning of surgery with A SIP OF WATER: Amlodipine ( Norvasc), Lopressor                                You may not have any metal on your body including hair pins and              piercings  Do not wear jewelry, make-up, lotions, powders or perfumes.             Do not wear nail polish.  Do not shave  48 hours prior to surgery.              Men may shave face and neck.   Do not bring valuables to the hospital. [place].  Contacts, dentures or bridgework may not be worn into surgery.  Leave suitcase in the car. After surgery it may be brought to your room.     Patients discharged the day of surgery will not be allowed to drive home.  Name and phone number of your driver:  Special Instructions: N/A              Please read over the following fact sheets you were given: _____________________________________________________________________             [facility] - Preparing for Surgery Before surgery, you can play an important role.  Because skin is not sterile, your skin needs to be as free of germs as possible.  You can reduce the number of germs on your skin by washing with CHG (chlorahexidine gluconate) soap before surgery.  CHG is an antiseptic cleaner which kills germs and bonds with the skin to continue killing germs even after washing. Please DO NOT use if you have an allergy to CHG or antibacterial soaps.  If your skin becomes reddened/irritated stop using the CHG and inform your nurse when you arrive at Short Stay. Do not shave (including legs and underarms) for at least 48  hours prior to the first CHG shower.  You may shave your face/neck. Please follow these instructions carefully:  1.  Shower with CHG Soap the night before surgery and the  morning of Surgery.  2.  If you choose to wash your hair, wash your hair first as usual with your  normal  shampoo.  3.  After you shampoo, rinse your hair and body thoroughly to remove the  shampoo.                           4.  Use CHG as you would any other liquid soap.  You can apply chg directly  to the skin and wash  Gently with a scrungie or clean washcloth.  5.  Apply the CHG Soap to your body ONLY FROM THE NECK DOWN.   Do not use on face/ open                           Wound or open sores. Avoid contact with eyes, ears mouth and genitals (private parts).                       Wash face,  Genitals (private parts) with your normal soap.             6.  Wash thoroughly, paying special attention to the area where your surgery  will be performed.  7.  Thoroughly rinse your body with warm water from the neck down.  8.  DO NOT shower/wash with your normal soap after using and rinsing off  the CHG Soap.                9.  Pat yourself dry with a clean towel.            10.  Wear clean pajamas.            11.  Place clean sheets on your bed the night of your first shower and do not  sleep with pets. Day of Surgery : Do not apply any lotions/deodorants the morning of surgery.  Please wear clean clothes to the hospital/surgery center.  FAILURE TO FOLLOW THESE INSTRUCTIONS MAY RESULT IN THE CANCELLATION OF YOUR SURGERY PATIENT SIGNATURE_________________________________  NURSE SIGNATURE__________________________________  ________________________________________________________________________  WHAT IS A BLOOD TRANSFUSION? Blood Transfusion Information  A transfusion is the replacement of blood or some of its parts. Blood is made up of multiple cells which provide different functions.  Red blood cells  carry oxygen and are used for blood loss replacement.  White blood cells fight against infection.  Platelets control bleeding.  Plasma helps clot blood.  Other blood products are available for specialized needs, such as hemophilia or other clotting disorders. BEFORE THE TRANSFUSION  Who gives blood for transfusions?   Healthy volunteers who are fully evaluated to make sure their blood is safe. This is blood bank blood. Transfusion therapy is the safest it has ever been in the practice of medicine. Before blood is taken from a donor, a complete history is taken to make sure that person has no history of diseases nor engages in risky social behavior (examples are intravenous drug use or sexual activity with multiple partners). The donor's travel history is screened to minimize risk of transmitting infections, such as malaria. The donated blood is tested for signs of infectious diseases, such as HIV and hepatitis. The blood is then tested to be sure it is compatible with you in order to minimize the chance of a transfusion reaction. If you or a relative donates blood, this is often done in anticipation of surgery and is not appropriate for emergency situations. It takes many days to process the donated blood. RISKS AND COMPLICATIONS Although transfusion therapy is very safe and saves many lives, the main dangers of transfusion include:  1. Getting an infectious disease. 2. Developing a transfusion reaction. This is an allergic reaction to something in the blood you were given. Every precaution is taken to prevent this. The decision to have a blood transfusion has been considered carefully by your caregiver before blood is given. Blood is not given unless the benefits outweigh  the risks. AFTER THE TRANSFUSION  Right after receiving a blood transfusion, you will usually feel much better and more energetic. This is especially true if your red blood cells have gotten low (anemic). The transfusion  raises the level of the red blood cells which carry oxygen, and this usually causes an energy increase.  The nurse administering the transfusion will monitor you carefully for complications. HOME CARE INSTRUCTIONS  No special instructions are needed after a transfusion. You may find your energy is better. Speak with your caregiver about any limitations on activity for underlying diseases you may have. SEEK MEDICAL CARE IF:   Your condition is not improving after your transfusion.  You develop redness or irritation at the intravenous (IV) site. SEEK IMMEDIATE MEDICAL CARE IF:  Any of the following symptoms occur over the next 12 hours:  Shaking chills.  You have a temperature by mouth above 102 F (38.9 C), not controlled by medicine.  Chest, back, or muscle pain.  People around you feel you are not acting correctly or are confused.  Shortness of breath or difficulty breathing.  Dizziness and fainting.  You get a rash or develop hives.  You have a decrease in urine output.  Your urine turns a dark color or changes to pink, red, or brown. Any of the following symptoms occur over the next 10 days:  You have a temperature by mouth above 102 F (38.9 C), not controlled by medicine.  Shortness of breath.  Weakness after normal activity.  The white part of the eye turns yellow (jaundice).  You have a decrease in the amount of urine or are urinating less often.  Your urine turns a dark color or changes to pink, red, or brown. Document Released: 05/24/2000 Document Revised: 08/19/2011 Document Reviewed: 01/11/2008 ExitCare Patient Information 2014 [place].  _______________________________________________________________________  Incentive Spirometer  An incentive spirometer is a tool that can help keep your lungs clear and active. This tool measures how well you are filling your lungs with each breath. Taking long deep breaths may help reverse or decrease the chance  of developing breathing (pulmonary) problems (especially infection) following:  A long period of time when you are unable to move or be active. BEFORE THE PROCEDURE   If the spirometer includes an indicator to show your best effort, your nurse or respiratory therapist will set it to a desired goal.  If possible, sit up straight or lean slightly forward. Try not to slouch.  Hold the incentive spirometer in an upright position. INSTRUCTIONS FOR USE  3. Sit on the edge of your bed if possible, or sit up as far as you can in bed or on a chair. 4. Hold the incentive spirometer in an upright position. 5. Breathe out normally. 6. Place the mouthpiece in your mouth and seal your lips tightly around it. 7. Breathe in slowly and as deeply as possible, raising the piston or the ball toward the top of the column. 8. Hold your breath for 3-5 seconds or for as long as possible. Allow the piston or ball to fall to the bottom of the column. 9. Remove the mouthpiece from your mouth and breathe out normally. 10. Rest for a few seconds and repeat Steps 1 through 7 at least 10 times every 1-2 hours when you are awake. Take your time and take a few normal breaths between deep breaths. 11. The spirometer may include an indicator to show your best effort. Use the indicator as a goal to work  toward during each repetition. 12. After each set of 10 deep breaths, practice coughing to be sure your lungs are clear. If you have an incision (the cut made at the time of surgery), support your incision when coughing by placing a pillow or rolled up towels firmly against it. Once you are able to get out of bed, walk around indoors and cough well. You may stop using the incentive spirometer when instructed by your caregiver.  RISKS AND COMPLICATIONS  Take your time so you do not get dizzy or light-headed.  If you are in pain, you may need to take or ask for pain medication before doing incentive spirometry. It is harder to  take a deep breath if you are having pain. AFTER USE  Rest and breathe slowly and easily.  It can be helpful to keep track of a log of your progress. Your caregiver can provide you with a simple table to help with this. If you are using the spirometer at home, follow these instructions: [place] IF:   You are having difficultly using the spirometer.  You have trouble using the spirometer as often as instructed.  Your pain medication is not giving enough relief while using the spirometer.  You develop fever of 100.5 F (38.1 C) or higher. SEEK IMMEDIATE MEDICAL CARE IF:   You cough up bloody sputum that had not been present before.  You develop fever of 102 F (38.9 C) or greater.  You develop worsening pain at or near the incision site. MAKE SURE YOU:   Understand these instructions.  Will watch your condition.  Will get help right away if you are not doing well or get worse. Document Released: 10/07/2006 Document Revised: 08/19/2011 Document Reviewed: 12/08/2006 [facility] Patient Information 2014 [place], [place].   ________________________________________________________________________

## 2014-07-22 ENCOUNTER — Encounter (HOSPITAL_COMMUNITY): Payer: Self-pay

## 2014-07-22 ENCOUNTER — Encounter (HOSPITAL_COMMUNITY)
Admission: RE | Admit: 2014-07-22 | Discharge: 2014-07-22 | Disposition: A | Discharge status: Home / Self Care | Payer: Medicaid Other | Source: Ambulatory Visit | Attending: Gynecologic Oncology | Admitting: Gynecologic Oncology

## 2014-07-22 DIAGNOSIS — C561 Malignant neoplasm of right ovary: Secondary | ICD-10-CM | POA: Diagnosis not present

## 2014-07-22 DIAGNOSIS — Z01818 Encounter for other preprocedural examination: Secondary | ICD-10-CM | POA: Diagnosis not present

## 2014-07-22 HISTORY — DX: Reserved for inherently not codable concepts without codable children: IMO0001

## 2014-07-22 LAB — CBC WITH DIFFERENTIAL/PLATELET
BASOS PCT: 0 % (ref 0–1)
Basophils Absolute: 0 10*3/uL (ref 0.0–0.1)
Eosinophils Absolute: 0.1 10*3/uL (ref 0.0–0.7)
Eosinophils Relative: 2 % (ref 0–5)
HCT: 38.7 % (ref 36.0–46.0)
Hemoglobin: 12.7 g/dL (ref 12.0–15.0)
LYMPHS ABS: 2 10*3/uL (ref 0.7–4.0)
Lymphocytes Relative: 34 % (ref 12–46)
MCH: 29.3 pg (ref 26.0–34.0)
MCHC: 32.8 g/dL (ref 30.0–36.0)
MCV: 89.4 fL (ref 78.0–100.0)
Monocytes Absolute: 0.7 10*3/uL (ref 0.1–1.0)
Monocytes Relative: 12 % (ref 3–12)
NEUTROS PCT: 52 % (ref 43–77)
Neutro Abs: 3.1 10*3/uL (ref 1.7–7.7)
PLATELETS: 493 10*3/uL — AB (ref 150–400)
RBC: 4.33 MIL/uL (ref 3.87–5.11)
RDW: 12.9 % (ref 11.5–15.5)
WBC: 5.9 10*3/uL (ref 4.0–10.5)

## 2014-07-22 LAB — ABO/RH: ABO/RH(D): O POS

## 2014-07-22 NOTE — Progress Notes (Signed)
EKG- 06/29/14 EPIC  CMP- done 07/08/14 in [facility]

## 2014-07-27 NOTE — Anesthesia Preprocedure Evaluation (Addendum)
Anesthesia Evaluation  Patient identified by MRN, date of birth, ID band Patient awake    Reviewed: Allergy & Precautions, H&P , NPO status , Patient's Chart, lab work & pertinent test results, reviewed documented beta blocker date and time   Airway Mallampati: II  TM Distance: >3 FB Neck ROM: full    Dental  (+) Chipped, Dental Advisory Given Chipped tooth right upper front:   Pulmonary shortness of breath and with exertion,  breath sounds clear to auscultation  Pulmonary exam normal       Cardiovascular Exercise Tolerance: Good hypertension, Pt. on home beta blockers and Pt. 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  Renal/GU negative Renal ROS  negative genitourinary   Musculoskeletal   Abdominal (+) + obese,   Peds  Hematology negative hematology ROS (+)   Anesthesia Other Findings   Reproductive/Obstetrics negative OB ROS                            Anesthesia Physical Anesthesia Plan  ASA: III  Anesthesia Plan: General   Post-op Pain Management:    Induction: Intravenous  Airway Management Planned: Oral ETT  Additional Equipment:   Intra-op Plan:   Post-operative Plan: Extubation in OR  Informed Consent: I have reviewed the patients History and Physical, chart, labs and discussed the procedure including the risks, benefits and alternatives for the proposed anesthesia with the patient or authorized representative who has indicated his/her understanding and acceptance.   Dental Advisory Given  Plan Discussed with: CRNA and Surgeon  Anesthesia Plan Comments:         Anesthesia Quick Evaluation

## 2014-07-28 ENCOUNTER — Ambulatory Visit (HOSPITAL_COMMUNITY): Payer: Medicaid Other | Admitting: Certified Registered Nurse Anesthetist

## 2014-07-28 ENCOUNTER — Encounter (HOSPITAL_COMMUNITY): Payer: Self-pay | Admitting: *Deleted

## 2014-07-28 ENCOUNTER — Encounter (HOSPITAL_COMMUNITY)
Admission: RE | Disposition: A | Discharge status: Home / Self Care | Payer: Self-pay | Source: Ambulatory Visit | Attending: Gynecologic Oncology

## 2014-07-28 ENCOUNTER — Inpatient Hospital Stay (HOSPITAL_COMMUNITY)
Admission: RE | Admit: 2014-07-28 | Discharge: 2014-07-31 | DRG: 737 | Disposition: A | Discharge status: Home / Self Care | Payer: Medicaid Other | Source: Ambulatory Visit | Attending: Gynecologic Oncology | Admitting: Gynecologic Oncology

## 2014-07-28 DIAGNOSIS — Z6836 Body mass index (BMI) 36.0-36.9, adult: Secondary | ICD-10-CM | POA: Diagnosis not present

## 2014-07-28 DIAGNOSIS — K660 Peritoneal adhesions (postprocedural) (postinfection): Secondary | ICD-10-CM | POA: Diagnosis present

## 2014-07-28 DIAGNOSIS — C561 Malignant neoplasm of right ovary: Principal | ICD-10-CM | POA: Diagnosis present

## 2014-07-28 DIAGNOSIS — Z01812 Encounter for preprocedural laboratory examination: Secondary | ICD-10-CM | POA: Diagnosis not present

## 2014-07-28 DIAGNOSIS — J9811 Atelectasis: Secondary | ICD-10-CM | POA: Diagnosis not present

## 2014-07-28 DIAGNOSIS — R971 Elevated cancer antigen 125 [CA 125]: Secondary | ICD-10-CM | POA: Diagnosis present

## 2014-07-28 DIAGNOSIS — E669 Obesity, unspecified: Secondary | ICD-10-CM | POA: Diagnosis present

## 2014-07-28 DIAGNOSIS — N736 Female pelvic peritoneal adhesions (postinfective): Secondary | ICD-10-CM | POA: Diagnosis present

## 2014-07-28 DIAGNOSIS — E876 Hypokalemia: Secondary | ICD-10-CM

## 2014-07-28 DIAGNOSIS — I10 Essential (primary) hypertension: Secondary | ICD-10-CM | POA: Diagnosis present

## 2014-07-28 DIAGNOSIS — N9489 Other specified conditions associated with female genital organs and menstrual cycle: Secondary | ICD-10-CM

## 2014-07-28 DIAGNOSIS — D490 Neoplasm of unspecified behavior of digestive system: Secondary | ICD-10-CM

## 2014-07-28 DIAGNOSIS — R1900 Intra-abdominal and pelvic swelling, mass and lump, unspecified site: Secondary | ICD-10-CM

## 2014-07-28 HISTORY — PX: ROBOTIC ASSISTED TOTAL HYSTERECTOMY WITH BILATERAL SALPINGO OOPHERECTOMY: SHX6086

## 2014-07-28 HISTORY — DX: Neoplasm of unspecified behavior of digestive system: D49.0

## 2014-07-28 HISTORY — DX: Unspecified glaucoma: H40.9

## 2014-07-28 LAB — TYPE AND SCREEN
ABO/RH(D): O POS
ANTIBODY SCREEN: NEGATIVE

## 2014-07-28 SURGERY — HYSTERECTOMY, TOTAL, ROBOT-ASSISTED, LAPAROSCOPIC, WITH BILATERAL SALPINGO-OOPHORECTOMY
Anesthesia: General | Laterality: Bilateral

## 2014-07-28 MED ORDER — BUPIVACAINE LIPOSOME 1.3 % IJ SUSP
20.0000 mL | Freq: Once | INTRAMUSCULAR | Status: AC
  Administered 2014-07-28: 20 mL
  Filled 2014-07-28: qty 20

## 2014-07-28 MED ORDER — SODIUM CHLORIDE 0.9 % IJ SOLN
INTRAMUSCULAR | Status: DC | PRN
  Administered 2014-07-28: 20 mL via INTRAVENOUS

## 2014-07-28 MED ORDER — NEOSTIGMINE METHYLSULFATE 10 MG/10ML IV SOLN
INTRAVENOUS | Status: AC
  Filled 2014-07-28: qty 1

## 2014-07-28 MED ORDER — VITAMINS A & D EX OINT
TOPICAL_OINTMENT | CUTANEOUS | Status: AC
  Administered 2014-07-28: 1
  Filled 2014-07-28: qty 5

## 2014-07-28 MED ORDER — SODIUM CHLORIDE 0.9 % IV SOLN
1000.0000 mg | Freq: Once | INTRAVENOUS | Status: AC
  Administered 2014-07-28: 1 g via INTRAVENOUS
  Filled 2014-07-28: qty 1

## 2014-07-28 MED ORDER — CISATRACURIUM BESYLATE 20 MG/10ML IV SOLN
INTRAVENOUS | Status: AC
  Filled 2014-07-28: qty 10

## 2014-07-28 MED ORDER — 0.9 % SODIUM CHLORIDE (POUR BTL) OPTIME
TOPICAL | Status: DC | PRN
  Administered 2014-07-28: 1000 mL

## 2014-07-28 MED ORDER — STERILE WATER FOR IRRIGATION IR SOLN
Status: DC | PRN
  Administered 2014-07-28: 1500 mL

## 2014-07-28 MED ORDER — PROMETHAZINE HCL 25 MG/ML IJ SOLN
INTRAMUSCULAR | Status: AC
  Filled 2014-07-28: qty 1

## 2014-07-28 MED ORDER — NEOSTIGMINE METHYLSULFATE 10 MG/10ML IV SOLN
INTRAVENOUS | Status: DC | PRN
  Administered 2014-07-28: .6 mg via INTRAVENOUS

## 2014-07-28 MED ORDER — IBUPROFEN 800 MG PO TABS: 800.0000 mg | ORAL_TABLET | Freq: Four times a day (QID) | ORAL | Status: DC

## 2014-07-28 MED ORDER — ENSURE COMPLETE PO LIQD
237.0000 mL | Freq: Two times a day (BID) | ORAL | Status: DC
  Administered 2014-07-29 – 2014-07-31 (×5): 237 mL via ORAL

## 2014-07-28 MED ORDER — ONDANSETRON HCL 4 MG/2ML IJ SOLN
4.0000 mg | Freq: Four times a day (QID) | INTRAMUSCULAR | Status: DC | PRN
  Administered 2014-07-29 – 2014-07-31 (×4): 4 mg via INTRAVENOUS
  Filled 2014-07-28 (×4): qty 2

## 2014-07-28 MED ORDER — HYDROMORPHONE HCL 1 MG/ML IJ SOLN
0.5000 mg | INTRAMUSCULAR | Status: AC | PRN
  Administered 2014-07-28 (×2): 0.5 mg via INTRAVENOUS
  Filled 2014-07-28 (×2): qty 1

## 2014-07-28 MED ORDER — LISINOPRIL-HYDROCHLOROTHIAZIDE 10-12.5 MG PO TABS: 1.0000 | ORAL_TABLET | Freq: Every day | ORAL | Status: DC

## 2014-07-28 MED ORDER — SODIUM CHLORIDE 0.9 % IJ SOLN
INTRAMUSCULAR | Status: AC
  Filled 2014-07-28: qty 10

## 2014-07-28 MED ORDER — ATROPINE SULFATE 0.4 MG/ML IJ SOLN
INTRAMUSCULAR | Status: AC
  Filled 2014-07-28: qty 1

## 2014-07-28 MED ORDER — MIDAZOLAM HCL 5 MG/5ML IJ SOLN
INTRAMUSCULAR | Status: DC | PRN
  Administered 2014-07-28 (×2): 1 mg via INTRAVENOUS

## 2014-07-28 MED ORDER — ONDANSETRON HCL 4 MG/2ML IJ SOLN
INTRAMUSCULAR | Status: DC | PRN
  Administered 2014-07-28: 4 mg via INTRAVENOUS

## 2014-07-28 MED ORDER — LIDOCAINE HCL (CARDIAC) 20 MG/ML IV SOLN
INTRAVENOUS | Status: DC | PRN
  Administered 2014-07-28: 100 mg via INTRAVENOUS

## 2014-07-28 MED ORDER — SUCCINYLCHOLINE CHLORIDE 20 MG/ML IJ SOLN
INTRAMUSCULAR | Status: DC | PRN
  Administered 2014-07-28: 100 mg via INTRAVENOUS

## 2014-07-28 MED ORDER — IBUPROFEN 800 MG PO TABS
800.0000 mg | ORAL_TABLET | Freq: Four times a day (QID) | ORAL | Status: DC
  Administered 2014-07-28 – 2014-07-31 (×7): 800 mg via ORAL
  Filled 2014-07-28 (×16): qty 1

## 2014-07-28 MED ORDER — LACTATED RINGERS IR SOLN
Status: DC | PRN
  Administered 2014-07-28: 1

## 2014-07-28 MED ORDER — EPHEDRINE SULFATE 50 MG/ML IJ SOLN
INTRAMUSCULAR | Status: DC | PRN
  Administered 2014-07-28: 5 mg via INTRAVENOUS

## 2014-07-28 MED ORDER — SUFENTANIL CITRATE 50 MCG/ML IV SOLN
INTRAVENOUS | Status: AC
  Filled 2014-07-28: qty 1

## 2014-07-28 MED ORDER — CEFAZOLIN SODIUM-DEXTROSE 2-3 GM-% IV SOLR
2.0000 g | Freq: Once | INTRAVENOUS | Status: AC
  Administered 2014-07-28: 2 g via INTRAVENOUS

## 2014-07-28 MED ORDER — HYDRALAZINE HCL 20 MG/ML IJ SOLN
INTRAMUSCULAR | Status: AC
  Filled 2014-07-28: qty 1

## 2014-07-28 MED ORDER — PROMETHAZINE HCL 25 MG/ML IJ SOLN
12.5000 mg | INTRAMUSCULAR | Status: DC | PRN
  Administered 2014-07-28: 6.25 mg via INTRAVENOUS

## 2014-07-28 MED ORDER — GLYCOPYRROLATE 0.2 MG/ML IJ SOLN
INTRAMUSCULAR | Status: AC
  Filled 2014-07-28: qty 3

## 2014-07-28 MED ORDER — LABETALOL HCL 5 MG/ML IV SOLN
INTRAVENOUS | Status: AC
  Filled 2014-07-28: qty 4

## 2014-07-28 MED ORDER — SUFENTANIL CITRATE 50 MCG/ML IV SOLN
INTRAVENOUS | Status: DC | PRN
  Administered 2014-07-28 (×3): 10 ug via INTRAVENOUS
  Administered 2014-07-28: 5 ug via INTRAVENOUS
  Administered 2014-07-28: 20 ug via INTRAVENOUS
  Administered 2014-07-28: 10 ug via INTRAVENOUS
  Administered 2014-07-28: 5 ug via INTRAVENOUS
  Administered 2014-07-28 (×3): 10 ug via INTRAVENOUS

## 2014-07-28 MED ORDER — ACETAMINOPHEN 10 MG/ML IV SOLN
1000.0000 mg | Freq: Once | INTRAVENOUS | Status: AC
  Administered 2014-07-28: 1000 mg via INTRAVENOUS
  Filled 2014-07-28: qty 100

## 2014-07-28 MED ORDER — LIDOCAINE HCL (CARDIAC) 20 MG/ML IV SOLN
INTRAVENOUS | Status: AC
  Filled 2014-07-28: qty 5

## 2014-07-28 MED ORDER — KCL IN DEXTROSE-NACL 20-5-0.45 MEQ/L-%-% IV SOLN
INTRAVENOUS | Status: DC
  Administered 2014-07-28 – 2014-07-29 (×2): via INTRAVENOUS
  Filled 2014-07-28 (×2): qty 1000

## 2014-07-28 MED ORDER — LACTATED RINGERS IV SOLN
INTRAVENOUS | Status: DC | PRN
  Administered 2014-07-28 (×3): via INTRAVENOUS

## 2014-07-28 MED ORDER — SODIUM CHLORIDE 0.9 % IV SOLN
INTRAVENOUS | Status: AC
  Filled 2014-07-28: qty 1

## 2014-07-28 MED ORDER — PROPOFOL 10 MG/ML IV BOLUS
INTRAVENOUS | Status: AC
  Filled 2014-07-28: qty 20

## 2014-07-28 MED ORDER — SODIUM CHLORIDE 0.9 % IJ SOLN
INTRAMUSCULAR | Status: AC
  Filled 2014-07-28: qty 20

## 2014-07-28 MED ORDER — METOPROLOL TARTRATE 12.5 MG HALF TABLET
12.5000 mg | ORAL_TABLET | Freq: Two times a day (BID) | ORAL | Status: DC
Start: 2014-07-28 — End: 2014-07-31
  Administered 2014-07-28 – 2014-07-31 (×3): 12.5 mg via ORAL
  Filled 2014-07-28 (×9): qty 1

## 2014-07-28 MED ORDER — CEFAZOLIN SODIUM-DEXTROSE 2-3 GM-% IV SOLR
INTRAVENOUS | Status: AC
  Filled 2014-07-28: qty 50

## 2014-07-28 MED ORDER — HYDROCHLOROTHIAZIDE 12.5 MG PO CAPS
12.5000 mg | ORAL_CAPSULE | Freq: Every day | ORAL | Status: DC
  Administered 2014-07-29 – 2014-07-31 (×3): 12.5 mg via ORAL
  Filled 2014-07-28 (×4): qty 1

## 2014-07-28 MED ORDER — ENOXAPARIN SODIUM 40 MG/0.4ML ~~LOC~~ SOLN
40.0000 mg | SUBCUTANEOUS | Status: DC
  Administered 2014-07-29 – 2014-07-31 (×3): 40 mg via SUBCUTANEOUS
  Filled 2014-07-28 (×3): qty 0.4

## 2014-07-28 MED ORDER — ONDANSETRON HCL 4 MG PO TABS: 4.0000 mg | ORAL_TABLET | Freq: Four times a day (QID) | ORAL | Status: DC | PRN

## 2014-07-28 MED ORDER — DEXAMETHASONE SODIUM PHOSPHATE 10 MG/ML IJ SOLN
INTRAMUSCULAR | Status: DC | PRN
  Administered 2014-07-28: 10 mg via INTRAVENOUS

## 2014-07-28 MED ORDER — EPHEDRINE SULFATE 50 MG/ML IJ SOLN
INTRAMUSCULAR | Status: AC
  Filled 2014-07-28: qty 1

## 2014-07-28 MED ORDER — LACTATED RINGERS IV SOLN: INTRAVENOUS | Status: DC

## 2014-07-28 MED ORDER — LISINOPRIL 10 MG PO TABS
10.0000 mg | ORAL_TABLET | Freq: Every day | ORAL | Status: DC
Start: 2014-07-28 — End: 2014-07-31
  Administered 2014-07-28 – 2014-07-31 (×4): 10 mg via ORAL
  Filled 2014-07-28 (×4): qty 1

## 2014-07-28 MED ORDER — ACETAMINOPHEN 500 MG PO TABS: 1000.0000 mg | ORAL_TABLET | Freq: Four times a day (QID) | ORAL | Status: DC

## 2014-07-28 MED ORDER — OXYCODONE HCL 5 MG PO TABS
10.0000 mg | ORAL_TABLET | ORAL | Status: DC | PRN
  Administered 2014-07-29 – 2014-07-31 (×7): 10 mg via ORAL
  Filled 2014-07-28 (×7): qty 2

## 2014-07-28 MED ORDER — MIDAZOLAM HCL 2 MG/2ML IJ SOLN
INTRAMUSCULAR | Status: AC
  Filled 2014-07-28: qty 2

## 2014-07-28 MED ORDER — HYDROMORPHONE HCL 1 MG/ML IJ SOLN
INTRAMUSCULAR | Status: AC
  Filled 2014-07-28: qty 1

## 2014-07-28 MED ORDER — DEXAMETHASONE SODIUM PHOSPHATE 10 MG/ML IJ SOLN
INTRAMUSCULAR | Status: AC
  Filled 2014-07-28: qty 1

## 2014-07-28 MED ORDER — PROPOFOL 10 MG/ML IV BOLUS
INTRAVENOUS | Status: DC | PRN
  Administered 2014-07-28: 140 mg via INTRAVENOUS

## 2014-07-28 MED ORDER — ACETAMINOPHEN 500 MG PO TABS
1000.0000 mg | ORAL_TABLET | Freq: Four times a day (QID) | ORAL | Status: AC
  Administered 2014-07-28 – 2014-07-29 (×4): 1000 mg via ORAL
  Filled 2014-07-28 (×5): qty 2

## 2014-07-28 MED ORDER — CISATRACURIUM BESYLATE (PF) 10 MG/5ML IV SOLN
INTRAVENOUS | Status: DC | PRN
  Administered 2014-07-28 (×2): 4 mg via INTRAVENOUS
  Administered 2014-07-28: 2 mg via INTRAVENOUS
  Administered 2014-07-28: 4 mg via INTRAVENOUS
  Administered 2014-07-28: 8 mg via INTRAVENOUS
  Administered 2014-07-28: 4 mg via INTRAVENOUS

## 2014-07-28 MED ORDER — ATORVASTATIN CALCIUM 20 MG PO TABS
20.0000 mg | ORAL_TABLET | Freq: Every day | ORAL | Status: DC
  Administered 2014-07-28 – 2014-07-31 (×4): 20 mg via ORAL
  Filled 2014-07-28 (×4): qty 1

## 2014-07-28 MED ORDER — AMLODIPINE BESYLATE 10 MG PO TABS
10.0000 mg | ORAL_TABLET | Freq: Every day | ORAL | Status: DC
  Administered 2014-07-29 – 2014-07-30 (×2): 10 mg via ORAL
  Filled 2014-07-28 (×2): qty 1

## 2014-07-28 MED ORDER — HYDRALAZINE HCL 20 MG/ML IJ SOLN
INTRAMUSCULAR | Status: DC | PRN
  Administered 2014-07-28: 4 mg via INTRAVENOUS

## 2014-07-28 MED ORDER — NITROGLYCERIN 2 % TD OINT
1.0000 [in_us] | TOPICAL_OINTMENT | Freq: Four times a day (QID) | TRANSDERMAL | Status: DC
  Filled 2014-07-28: qty 30

## 2014-07-28 MED ORDER — LABETALOL HCL 5 MG/ML IV SOLN
5.0000 mg | INTRAVENOUS | Status: DC | PRN
  Administered 2014-07-28: 5 mg via INTRAVENOUS

## 2014-07-28 MED ORDER — ONDANSETRON HCL 4 MG/2ML IJ SOLN
INTRAMUSCULAR | Status: AC
  Filled 2014-07-28: qty 2

## 2014-07-28 MED ORDER — HYDROMORPHONE HCL 1 MG/ML IJ SOLN
0.5000 mg | INTRAMUSCULAR | Status: DC | PRN
  Administered 2014-07-28 (×2): 0.5 mg via INTRAVENOUS

## 2014-07-28 SURGICAL SUPPLY — 78 items
BENZOIN TINCTURE PRP APPL 2/3 (GAUZE/BANDAGES/DRESSINGS) IMPLANT
BLADE EXTENDED COATED 6.5IN (ELECTRODE) ×2 IMPLANT
CHLORAPREP W/TINT 26ML (MISCELLANEOUS) ×2 IMPLANT
CORDS BIPOLAR (ELECTRODE) IMPLANT
COVER SURGICAL LIGHT HANDLE (MISCELLANEOUS) ×2 IMPLANT
COVER TIP SHEARS 8 DVNC (MISCELLANEOUS) ×1 IMPLANT
COVER TIP SHEARS 8MM DA VINCI (MISCELLANEOUS) ×1
DECANTER SPIKE VIAL GLASS SM (MISCELLANEOUS) IMPLANT
DRAPE ARM DVNC X/XI (DISPOSABLE) ×4 IMPLANT
DRAPE COLUMN DVNC XI (DISPOSABLE) ×1 IMPLANT
DRAPE DA VINCI XI ARM (DISPOSABLE) ×4
DRAPE DA VINCI XI COLUMN (DISPOSABLE) ×1
DRAPE SHEET LG 3/4 BI-LAMINATE (DRAPES) ×4 IMPLANT
DRAPE SURG IRRIG POUCH 19X23 (DRAPES) ×2 IMPLANT
DRAPE TABLE BACK 44X90 PK DISP (DRAPES) IMPLANT
DRAPE UTILITY XL STRL (DRAPES) ×2 IMPLANT
DRAPE WARM FLUID 44X44 (DRAPE) ×4 IMPLANT
DRSG TEGADERM 2-3/8X2-3/4 SM (GAUZE/BANDAGES/DRESSINGS) IMPLANT
DRSG TEGADERM 4X4.75 (GAUZE/BANDAGES/DRESSINGS) ×4 IMPLANT
DRSG TEGADERM 6X8 (GAUZE/BANDAGES/DRESSINGS) ×8 IMPLANT
DRSG TELFA 4X10 ISLAND STR (GAUZE/BANDAGES/DRESSINGS) ×2 IMPLANT
ELECT REM PT RETURN 9FT ADLT (ELECTROSURGICAL) ×2
ELECTRODE REM PT RTRN 9FT ADLT (ELECTROSURGICAL) ×1 IMPLANT
GAUZE SPONGE 2X2 8PLY STRL LF (GAUZE/BANDAGES/DRESSINGS) IMPLANT
GLOVE BIO SURGEON STRL SZ 6.5 (GLOVE) ×8 IMPLANT
GLOVE BIO SURGEON STRL SZ7.5 (GLOVE) IMPLANT
GLOVE BIOGEL PI IND STRL 7.0 (GLOVE) ×2 IMPLANT
GLOVE BIOGEL PI INDICATOR 7.0 (GLOVE) ×2
GLOVE ECLIPSE 7.5 STRL STRAW (GLOVE) ×6 IMPLANT
GLOVE INDICATOR 8.0 STRL GRN (GLOVE) ×4 IMPLANT
GOWN STRL REUS W/ TWL XL LVL3 (GOWN DISPOSABLE) ×5 IMPLANT
GOWN STRL REUS W/TWL XL LVL3 (GOWN DISPOSABLE) ×5
HOLDER FOLEY CATH W/STRAP (MISCELLANEOUS) ×2 IMPLANT
KIT ACCESSORY DA VINCI DISP (KITS)
KIT ACCESSORY DVNC DISP (KITS) IMPLANT
LIGASURE IMPACT 36 18CM CVD LR (INSTRUMENTS) ×2 IMPLANT
LIQUID BAND (GAUZE/BANDAGES/DRESSINGS) ×2 IMPLANT
MANIPULATOR UTERINE 4.5 ZUMI (MISCELLANEOUS) IMPLANT
OCCLUDER COLPOPNEUMO (BALLOONS) IMPLANT
PENCIL BUTTON HOLSTER BLD 10FT (ELECTRODE) ×2 IMPLANT
POUCH SPECIMEN RETRIEVAL 10MM (ENDOMECHANICALS) ×2 IMPLANT
RETRACTOR WND ALEXIS 25 LRG (MISCELLANEOUS) ×1 IMPLANT
RTRCTR WOUND ALEXIS 25CM LRG (MISCELLANEOUS) ×2
SCISSORS LAP 5X45 EPIX DISP (ENDOMECHANICALS) ×2 IMPLANT
SEAL CANN UNIV 5-8 DVNC XI (MISCELLANEOUS) ×4 IMPLANT
SEAL XI 5MM-8MM UNIVERSAL (MISCELLANEOUS) ×4
SEALER VESSEL DA VINCI XI (MISCELLANEOUS) ×1
SEALER VESSEL EXT DVNC XI (MISCELLANEOUS) ×1 IMPLANT
SET IRRIG TUBING LAPAROSCOPIC (IRRIGATION / IRRIGATOR) ×2 IMPLANT
SET TUBE IRRIG SUCTION NO TIP (IRRIGATION / IRRIGATOR) IMPLANT
SHEET LAVH (DRAPES) ×2 IMPLANT
SLEEVE SURGEON STRL (DRAPES) ×2 IMPLANT
SOLUTION ELECTROLUBE (MISCELLANEOUS) ×2 IMPLANT
SPONGE GAUZE 2X2 STER 10/PKG (GAUZE/BANDAGES/DRESSINGS)
SPONGE LAP 18X18 X RAY DECT (DISPOSABLE) ×6 IMPLANT
STAPLER PROXIMATE 55 BLUE (STAPLE) ×2 IMPLANT
STAPLER VISISTAT (STAPLE) ×2 IMPLANT
STRIP CLOSURE SKIN 1/2X4 (GAUZE/BANDAGES/DRESSINGS) IMPLANT
SUT PDS AB 1 TP1 96 (SUTURE) ×4 IMPLANT
SUT VIC AB 0 CT1 27 (SUTURE) ×3
SUT VIC AB 0 CT1 27XBRD ANTBC (SUTURE) ×3 IMPLANT
SUT VIC AB 4-0 PS2 27 (SUTURE) ×4 IMPLANT
SUT VICRYL 0 TIES 12 18 (SUTURE) ×6 IMPLANT
SUT VICRYL 0 UR6 27IN ABS (SUTURE) ×2 IMPLANT
SUT VICRYL 2 0 18  UND BR (SUTURE) ×1
SUT VICRYL 2 0 18 UND BR (SUTURE) ×1 IMPLANT
SYR BULB IRRIGATION 50ML (SYRINGE) ×2 IMPLANT
TOWEL BLUE STERILE X RAY DET (MISCELLANEOUS) ×2 IMPLANT
TOWEL OR 17X26 10 PK STRL BLUE (TOWEL DISPOSABLE) ×4 IMPLANT
TRAP SPECIMEN MUCOUS 40CC (MISCELLANEOUS) ×2 IMPLANT
TRAY FOLEY CATH 14FRSI W/METER (CATHETERS) ×2 IMPLANT
TRAY LAPAROSCOPIC (CUSTOM PROCEDURE TRAY) ×2 IMPLANT
TROCAR UNIVERSAL OPT 12M 100M (ENDOMECHANICALS) ×2 IMPLANT
TROCAR XCEL 12X100 BLDLESS (ENDOMECHANICALS) ×2 IMPLANT
TROCAR XCEL BLUNT TIP 100MML (ENDOMECHANICALS) IMPLANT
TROCAR XCEL NON-BLD 5MMX100MML (ENDOMECHANICALS) ×2 IMPLANT
TUBING INSUFFLATION 10FT LAP (TUBING) ×2 IMPLANT
WATER STERILE IRR 1500ML POUR (IV SOLUTION) ×4 IMPLANT

## 2014-07-28 NOTE — Interval H&P Note (Signed)
History and Physical Interval Note:  07/28/2014 7:32 AM  Sarah [name]  has presented today for surgery, with the diagnosis of RIGHT OVARIAN MASS   The various methods of treatment have been discussed with the patient and family. After consideration of risks, benefits and other options for treatment, the patient has consented to  Procedure(s): ROBOTIC ASSISTED BILATERAL SALPINGO OOPHORECTOMY, POSS LAPAROTOMY,  POSS DEBULKING  (Bilateral) as a surgical intervention .  The patient's history has been reviewed, patient examined, no change in status, stable for surgery.  I have reviewed the patient's chart and labs.  Questions were answered to the patient's satisfaction.     [place], [facility]

## 2014-07-28 NOTE — Anesthesia Postprocedure Evaluation (Signed)
  Anesthesia Post-op Note  Patient: Sarah [name]  Procedure(s) Performed: Procedure(s) (LRB): ROBOTIC ASSISTED lysis of adhesions with biopsies, converted to LAPAROTOMY, bilateral salpingoorphorectomy, omentectomy,appendectomy (Bilateral)  Patient Location: PACU  Anesthesia Type: General  Level of Consciousness: awake and alert   Airway and Oxygen Therapy: Patient Spontanous Breathing  Post-op Pain: mild  Post-op Assessment: Post-op Vital signs reviewed, Patient's Cardiovascular Status Stable, Respiratory Function Stable, Patent Airway and No signs of Nausea or vomiting  Last Vitals:  Filed Vitals:   07/28/14 1337  BP: 156/85  Pulse: 75  Temp: 36.4 C  Resp: 20    Post-op Vital Signs: stable   Complications: No apparent anesthesia complications

## 2014-07-28 NOTE — Op Note (Signed)
Preoperative Diagnosis: 1. Right adnexal mass.  Elevated CA 125  Postoperative Diagnosis: Pssoamamtosis concernig for ovarian cancer on frozen section.   Procedure(s) Performed: 1. Robotic assisted adhesiolysis, Exploratory laparotomy with bilateral salpingo-oophorectomy, infragastric omentectomy, appendectmy washings and biopsies for ovarian cancer .  Surgeon: Francetta Found.  Skeet Latch, MD., PhD  Assistant Surgeon: Lahoma Crocker M.D.  Specimens: Bilateral tubes / ovaries, appendix,  peritoneal biopsies, washings and omentum.   EBL 375cc.    Complications: Loss of station with trendelenburg positing,    Operative Findings: Omentum with cystic changes, bilateral ovaries with excrescences, omentum adherent to the anterior abdominal wall and left pelvic sidewall, appendix with psammomatous changes on the mesentery and serosa. Sigmoid colon adherent to the left pelvic sidewall.  No evidence of diaphragmatic changes.  Extensive psammotosis of the omentum.  Right ovary with psammoma bodies concerning for serous cancer.  Unable to perform LND because of obesity and body habitus.  The right arm was adjusted intraoperatively to facilitate BP measurements and the patient last station greater than 12 cm.  The decision was made to open given the inability to secure her station on the bed with appropriate arm placement.  At the completion of the procedure no residual psammotosis was present.  Normal bilateral pelvic and para-aortic lymph nodes  no evidence of disease on  small and large bowel palpation. This represented an optimal cytoreduction with no gross visible disease remaining.    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 She is placed in the dorsal lithotomy position and secured to the operative table over the chest with tape.   The patient was prepped and  draped and a sponge stick was placed in the vagina.   An OG tube was present and functional. At an area on the left in line with the nipple approximately 2 cm below the ribs a 5 mm Optiview inserted under direct visualization. The abdomen was insufflated to 15 mm of mercury and the pressure never deviated above that throughout the remainder of the procedure. Maximum Trendelenburg positioning was obtained. In the left and right  abdomen lateral to the umbilicus 28mm ports were inserted.  Dense omental adhesions were appreciated.  The robot was docked to facilitate resection of the omentum.  The omentum was dissected and visualization of the lower abdomen was obtained.  Washings were obtained. The superior umbilical port was inserted.  A portion of the left omentum adherent to the left pelvic sidewall was resected because excrescences were identified.  It was placed in an endo-bag and delivered through the RUQ port.  Ms Paden Kuras  was noted to have moved over 12 cm in station.  The robot was undocked and it was appreciated that the right arm was not secured to her side.  A decision was made that laparotomy would ensure the procedure could be performed safely.  The omental biopsy returned as psammomatosis.      A midline vertical incision was made and carried through the subcutaneous tissue to the fascia. The fascial incision was made and extended superiorally. The rectus muscles were separated. The peritoneum was identified and entered. Peritoneal incision was extended longitudinally.  The abdominal cavity was entered sharply and without incident. An alexis retractor was inserted.  A Bookwalter retractor was then placed.   An infragastric omentectomy was performed using a Ligasure.  Hemostasis was assured.  After packing the small bowel into the  upper abdomen, we began the procedure by entering the  Posterior peritoneum on the right .   The pararectal space was developed and the retroperitoneum  developed up to the level of the common iliac artery.  The course of the ureter was identified with ease. The right  IP was then skeletonized, clamped, cut and ligasured.  The right adnexa was dissected free from the pelvic sidewall and ladder and sent to pathology.  It returned suspicious for serous ovarian cancer.    The sigmoid was dissected from its adhesions to the left pelvic sidewall.  The left  retroperitoneal space was explored and the ureter was identified.  The left adnexa   Was dissected free from its attachments to the the sidewall and rectosigmoid.  The left ureter was identified in the retroperitoneum.  The left IP was clamped and ligasured.      Residual omentum was removed from the left pelvic sidewall, the anterior cul de sac and the anterior abdominal wall.   The mesoappendix was transected and the base of the appendix was transected.  Hemostasis was assured.    Peritoneal biopsies of the pelvis were collected.  The pelvis was copiously irrigated. Hemostasis was observed.   The fascia was reapproximated with 0 looped PDS using a total of two sutures. The subcutaneous layer was then irrigated copiously.  The subcutaneous layer was infiltrated with exparil  And the skin closed with staples.  The patient tolerated the procedure well.   Sponge, lap and needle counts were correct x 2.    The patient had sequential compression devices for VTE prophylaxis and will receive Lovenox postoperatively. The patient was extubated and taken to the recovery room in stable condition.

## 2014-07-28 NOTE — Anesthesia Procedure Notes (Signed)
Procedure Name: Intubation Date/Time: 07/28/2014 7:44 AM Performed by: Sharlette Dense Pre-anesthesia Checklist: Patient identified, Emergency Drugs available, Suction available and Patient being monitored Patient Re-evaluated:Patient Re-evaluated prior to inductionOxygen Delivery Method: Circle system utilized Preoxygenation: Pre-oxygenation with 100% oxygen Intubation Type: IV induction Ventilation: Oral airway inserted - appropriate to patient size Laryngoscope Size: Miller and 2 Grade View: Grade I Tube type: Oral Tube size: 7.5 mm Number of attempts: 1 Airway Equipment and Method: Stylet Placement Confirmation: ETT inserted through vocal cords under direct vision,  positive ETCO2,  CO2 detector and breath sounds checked- equal and bilateral Secured at: 22 cm Tube secured with: Tape Dental Injury: Teeth and Oropharynx as per pre-operative assessment

## 2014-07-28 NOTE — H&P (View-Only) (Signed)
GYN ONC FOLLOWUP VISIT:  CC:  Chief Complaint  Patient presents with  . Back Pain    HPI:  Patient is a 49 year old gravida 2 para 2 who was was seen by my partner Dr Nancy Marus after being admitted from the emergency room when she presented with ongoing back pain. She states that she has had low back pain. Progressively worse over the past 2-3 months. She states the pain associated with radiation down her legs and occasionally so severe that she cannot walk secondary to pain. She denies any neuropathy. She was seen in the emergency room she was found have a blood pressure of 260/148 which improved 184/102 with 20 mg of IV labetalol 1 and a Cardene drip. Blood work is essentially unremarkable. Imaging was performed while in the emergency room that revealed:  FINDINGS: CTA CHEST FINDINGS  There is no evidence of aortic dissection or intramural hematoma.There is no obvious filling defect in the pulmonary arterial tree.  There is circumferential wall thickening of the innominate artery resulting in mild narrowing. Little if any irregular plaque or atherosclerotic calcification is present in the thorax. Postoperative changes from left hemithyroidectomy are noted.  Several scattered mediastinal nodes are present. 1.6 cm short axis diameter paratracheal node on image 30. 1.1 cm right hilar node on image 47. 9 mm left hilar lymph node on image 45.  No pneumothorax. No pleural effusion.  Dependent atelectasis bilaterally with low volumes. 3 mm left lower lobe pulmonary nodule on image 43.  Review of the MIP images confirms the above findings.  CTA ABDOMEN AND PELVIS FINDINGS  There is no evidence of aortic aneurysm or aortic dissection.  Celiac is patent. Branch vessels are patent. There is mild narrowing at the origin of the SMA. Branch vessels are grossly patent.  IMA is diminutive and patent. Branch vessels are grossly patent.  Single renal arteries are  patent.  Bilateral common, internal, and external iliac arteries are patent. Mild atherosclerotic changes with calcification in the bilateral internal iliac arteries.  Liver, gallbladder, spleen, pancreas, adrenal glands, and kidneys are within normal limits.  No abnormal retroperitoneal adenopathy.  There is an indeterminate lesion in the right adnexa measuring 4.4 cm with elevated Hounsfield unit measurements. Left adnexa is unremarkable other than calcifications.  There is an unusual stranding within the peritoneal fat of the omentum hand in the anterior lower abdomen and pelvis. Within the regions of stranding, there is hyperdense linear areas most likely calcification. There is a small amount of free fluid in the pelvis with the dependent layering calcification.  Bladder is within normal limits. Uterus is absent.  Appendix is within normal limits.  Review of the MIP images confirms the above findings.  IMPRESSION: There is no evidence of thoracic or abdominal aortic aneurysm.  There is noted to be circumferential narrowing of the innominate artery but this narrowing is not significant. It is associated with significant wall thickening. This may simply be due to atherosclerosis, however vasculitis is in the differential diagnosis including mycotic disorder.  There is mild abnormal mediastinal adenopathy and hilar adenopathy. Inflammatory and malignant etiology are not excluded such as metastatic disease, mycobacterium infection, lymphoma, sarcoidosis.  3 mm left lower lobe pulmonary nodule. If the patient is at high risk for bronchogenic carcinoma, follow-up chest CT at 1 year is recommended. If the patient is at low risk, no follow-up is needed.  Within the abdomen, there is stranding and calcification in the omental fat worrisome for peritoneal carcinomatosis. There is a  small amount of free fluid with dependent calcification.  Indeterminate 4.4 cm right  adnexal lesion. Pelvic ultrasound is recommended to further characterize. The uterus is notably absent.  Ultrasound:  Right ovary  Measurements: 4.6 by 5.2 by 3.8 cm. Abnormal complex hypo echogenicity throughout most of the structure concerning for ovarian mass. This seems somewhat infiltrative and is difficult to separate from the adjacent ovarian parenchyma, but a portion of but measures 2.8 cm on image 54.  IMPRESSION: 1. Complex right ovarian mass or cyst, as on recent CT scan. Clearly given the omental caking this is the primary site of concern and ovarian carcinoma is suspected. 2. We were unable to demonstrate the left ovary sonographically.   CA 125 was 58 (06/30/14)  The patient states that she has had some periodic sharp abdominal pains over the past 2-3 months. She believes that they're "gas pains". They never wake her up at night but the back pain does wake her up at night. She denies any nausea vomiting at home though she did have some Tuesday while in the hospital. Additionally she does not endorse any early satiety except while here in the hospital. She denies any significant change in her bowel or bladder habits. However, she states that for the past month she has 5-6 loose bowel movements per day. She had a hysterectomy proximal me 10 years ago by Dr. Warnell Forester for a fibroid uterus. She's never had a colonoscopy. She had a mammogram 3 years ago that she states was unremarkable. She has 2 maternal great aunts he may have had cancer she's not sure what type they had. There is no history of breast cancer. She has 2 sons age 63 and 73. 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 She denies use of tobacco.  Review of Systems  Constitutional: Denies fever. 15 pound weight gain over the past 2-3 years Skin: No rash,  Cardiovascular: No chest pain, + shortness of breath, no edema. The patient states that she thinks she could go up a flight of stairs for which patient  would've breath at the top. Pulmonary: + productive cough, yellow sputum.  Gastro Intestinal: Reporting intermittent lower abdominal soreness as above. +diarrhea reported. No bright red blood per rectum  Genitourinary: No frequency, urgency, or dysuria. Denies vaginal bleeding and discharge.  Musculoskeletal: + back pain radiating down legs, no neuropathy. Neurologic: No weakness, numbness, or change in gait.   Right ovary  Measurements: 4.6 by 5.2 by 3.8 cm. Abnormal complex hypo echogenicity throughout most of the structure concerning for ovarian mass. This seems somewhat infiltrative and is difficult to separate from the adjacent ovarian parenchyma, but a portion of but measures 2.8 cm on image 54.  Left ovary  Not visible sonographically.  Other findings  No free fluid.  IMPRESSION: 1. Complex right ovarian mass or cyst, as on recent CT scan. Clearly given the omental caking this is the primary site of concern and ovarian carcinoma is suspected. 2. We were unable to demonstrate the left ovary sonographically.  CA-125 was drawn that was 58. It is for this reason that GYN oncology is asked to see her today  Current Meds:  Current Outpatient Prescriptions on File Prior to Visit  Medication Sig Dispense Refill  . acetaminophen (TYLENOL) 500 MG tablet Take 1,000 mg by mouth every 6 (six) hours as needed for mild pain or headache.    Marland Kitchen amLODipine (NORVASC) 10 MG tablet Take 1 tablet (10 mg total) by mouth daily. Pukalani  tablet 0  . metoprolol tartrate (LOPRESSOR) 25 MG tablet Take 0.5 tablets (12.5 mg total) by mouth 2 (two) times daily. 60 tablet 0  . oxyCODONE-acetaminophen (PERCOCET/ROXICET) 5-325 MG per tablet Take 1 tablet by mouth every 4 (four) hours as needed for moderate pain. (Patient not taking: Reported on 07/08/2014) 45 tablet 0   No current facility-administered medications on file prior to visit.    Allergy: No Known Allergies  Social Hx:  History    Social History  . Marital Status: Married    Spouse Name: N/A    Number of Children: N/A  . Years of Education: N/A   Occupational History  . Not on file.   Social History Main Topics  . Smoking status: Never Smoker   . Smokeless tobacco: Never Used  . Alcohol Use: No  . Drug Use: No  . Sexual Activity: Not on file   Other Topics Concern  . Not on file   Social History Narrative    Past Surgical Hx:  Past Surgical History  Procedure Laterality Date  . Abdominal hysterectomy    . Thyroidectomy, partial      Past Medical Hx:  Past Medical History  Diagnosis Date  . Asthma   . Hypertension   . Glucagonoma     Oncology Hx:   No history exists.    Family Hx:  Family History  Problem Relation Age of Onset  . Hypertension Mother   . Hypertension Father   . Diabetes Father     Vitals: Blood pressure 157/82, pulse 88, temperature 97.9 F (36.6 C), temperature source Oral, resp. rate 20, height 5\' 5"  (1.651 m), weight 224 lb 3.3 oz (101.7 kg), SpO2 100 %.  Physical Exam: General: no distress HEENT: normal no lesions  Neck: no masses Pulm: CTAB, no added sounds CVS: RRR, no murmurs or gallops Abd: soft, overweight, nontender, no palpable fluid wave, no hernias. Pelvic: normal external female genitalia, surgically absent cervix (though there is a small piece of redundant vaginal tissue that may represent residual cervix), Some nodularity is appreciated in the right adnexa but not a discrete mass. Rectal exam: no RV septal nodularity appreciated. Normal tone. Extremities: no edema  Assessment/Plan: 49 year old who was admitted for hypertensive emergency. At the time she was found to have a thickened omentum, a right ovarian mass, and a slightly elevated CA-125. Based on the findings of the CT scan, ultrasound, and CA-125 we recommend proceeding with a diagnostic  laparoscopy and right salpingo-oophorectomy via either robotic or laparotomy approach pending peritoneal findings. She is amenable to this. She understands that the mass will be sent for frozen section. Should the mass be benign she would like to have the contralateral ovary removed. Should there be evidence of malignancy she will either have surgical debulking done via laparoscopy or if possible if not she'll undergo exploratory laparotomy and debulking surgery. She knows that Dr. Janie Morning will be her surgery and we will tentatively plan for surgery for February 18. However, she requires optimization of her blood pressure which today is still not adequately controlled. She hasn't appointment with the [facility] today and I provided them with a note requesting optimization of her blood pressure prior to every 18th. Donaciano Eva, MD

## 2014-07-28 NOTE — Progress Notes (Signed)
PACU note---pt's blood pressure elevated on arrival to PACU; Dr. Landry Dyke, anesthes, notified, order rec'd and med given

## 2014-07-28 NOTE — Transfer of Care (Signed)
Immediate Anesthesia Transfer of Care Note  Patient: Sarah [name]  Procedure(s) Performed: Procedure(s): ROBOTIC ASSISTED lysis of adhesions with biopsies, converted to LAPAROTOMY, bilateral salpingoorphorectomy, omentectomy,appendectomy (Bilateral)  Patient Location: PACU  Anesthesia Type:General  Level of Consciousness: awake  Airway & Oxygen Therapy: Patient Spontanous Breathing and Patient connected to face mask oxygen  Post-op Assessment: Report given to RN and Post -op Vital signs reviewed and stable  Post vital signs: Reviewed and stable  Last Vitals:  Filed Vitals:   07/28/14 0500  BP: 169/95  Pulse: 81  Temp: 36.8 C  Resp: 20    Complications: No apparent anesthesia complications

## 2014-07-29 ENCOUNTER — Ambulatory Visit: Payer: No Typology Code available for payment source

## 2014-07-29 ENCOUNTER — Encounter (HOSPITAL_COMMUNITY): Payer: Self-pay | Admitting: Gynecologic Oncology

## 2014-07-29 LAB — CBC
HEMATOCRIT: 34.9 % — AB (ref 36.0–46.0)
Hemoglobin: 11.3 g/dL — ABNORMAL LOW (ref 12.0–15.0)
MCH: 28.7 pg (ref 26.0–34.0)
MCHC: 32.4 g/dL (ref 30.0–36.0)
MCV: 88.6 fL (ref 78.0–100.0)
PLATELETS: 387 10*3/uL (ref 150–400)
RBC: 3.94 MIL/uL (ref 3.87–5.11)
RDW: 13.1 % (ref 11.5–15.5)
WBC: 12.4 10*3/uL — ABNORMAL HIGH (ref 4.0–10.5)

## 2014-07-29 LAB — BASIC METABOLIC PANEL
ANION GAP: 6 (ref 5–15)
BUN: 13 mg/dL (ref 6–23)
CHLORIDE: 100 mmol/L (ref 96–112)
CO2: 26 mmol/L (ref 19–32)
CREATININE: 0.84 mg/dL (ref 0.50–1.10)
Calcium: 8.5 mg/dL (ref 8.4–10.5)
GFR calc non Af Amer: 81 mL/min — ABNORMAL LOW (ref >90)
Glucose, Bld: 146 mg/dL — ABNORMAL HIGH (ref 70–99)
Potassium: 3.9 mmol/L (ref 3.5–5.1)
SODIUM: 132 mmol/L — AB (ref 135–145)

## 2014-07-29 MED ORDER — ENOXAPARIN (LOVENOX) PATIENT EDUCATION KIT
PACK | Freq: Once | Status: AC
  Administered 2014-07-29: 16:00:00
  Filled 2014-07-29: qty 1

## 2014-07-29 MED ORDER — HYDROMORPHONE HCL 1 MG/ML IJ SOLN
0.5000 mg | INTRAMUSCULAR | Status: AC | PRN
  Administered 2014-07-29: 1 mg via INTRAVENOUS
  Administered 2014-07-29: 0.5 mg via INTRAVENOUS
  Filled 2014-07-29 (×2): qty 1

## 2014-07-29 NOTE — Progress Notes (Signed)
1 Day Post-Op Procedure(s) (LRB): ROBOTIC ASSISTED lysis of adhesions with biopsies, converted to LAPAROTOMY, bilateral salpingoorphorectomy, omentectomy,appendectomy (Bilateral)  Subjective: Patient reports sore throat post-op.  Tolerating liquids with no nausea or emesis.  Up with assistance without difficulty.  Pain controlled with PRN medications.  No concerns voiced.    Objective: Vital signs in last 24 hours: Temp:  [97.6 F (36.4 C)-98.1 F (36.7 C)] 98.1 F (36.7 C) (02/19 1400) Pulse Rate:  [54-98] 54 (02/19 1400) Resp:  [18-20] 18 (02/19 1400) BP: (135-165)/(70-96) 135/70 mmHg (02/19 1400) SpO2:  [98 %-100 %] 98 % (02/19 1400) Last BM Date: 07/27/14  Intake/Output from previous day: 02/18 0701 - 02/19 0700 In: 4074.2 [P.O.:460; I.V.:3614.2] Out: 1875 [Urine:1500; Blood:375]  Physical Examination: General: alert, cooperative, no distress and soft spoken due to sore throat post-op Resp: clear to auscultation bilaterally Cardio: regular rate and rhythm, S1, S2 normal, no murmur, click, rub or gallop GI: soft, non-tender; bowel sounds normal; no masses,  no organomegaly and incision: midline incision with staples, no drainage, dressing removed Extremities: extremities normal, atraumatic, no cyanosis or edema  Labs: WBC/Hgb/Hct/Plts:  12.4/11.3/34.9/387 (02/19 0529) BUN/Cr/glu/ALT/AST/amyl/lip:  13/0.84/--/--/--/--/-- (02/19 0529)  Assessment: 49 y.o. s/p Procedure(s): ROBOTIC ASSISTED lysis of adhesions with biopsies, converted to LAPAROTOMY, bilateral salpingoorphorectomy, omentectomy,appendectomy: stable Pain:  Pain is well-controlled on PRN medications.  Heme: Hgb 11.3 and Hct 34.9 this am.  Stable post-operatively.  CV: BP and HR stable.  Home medications reordered- Lisinopril-HCTZ, Norvasc, Metoprolol.  GI:  Tolerating po: Yes     GU: Adequate output reported.    FEN: Stable post-operatively.  Prophylaxis: pharmacologic prophylaxis (with any of the  following: enoxaparin (Lovenox) 40mg  SQ 2 hours prior to surgery then every day) and intermittent pneumatic compression boots.  Plan: Saline lock IV Diet as tolerated Encourage ambulation, IS use, deep breathing, and coughing Continue post-operative plan of care per Dr. [name]   LOS: 1 day    CROSS, MELISSA DEAL 07/29/2014, 4:32 PM

## 2014-07-30 LAB — BASIC METABOLIC PANEL
ANION GAP: 8 (ref 5–15)
BUN: 22 mg/dL (ref 6–23)
CHLORIDE: 97 mmol/L (ref 96–112)
CO2: 32 mmol/L (ref 19–32)
Calcium: 9.4 mg/dL (ref 8.4–10.5)
Creatinine, Ser: 1.11 mg/dL — ABNORMAL HIGH (ref 0.50–1.10)
GFR, EST AFRICAN AMERICAN: 67 mL/min — AB (ref >90)
GFR, EST NON AFRICAN AMERICAN: 58 mL/min — AB (ref >90)
GLUCOSE: 121 mg/dL — AB (ref 70–99)
POTASSIUM: 3.6 mmol/L (ref 3.5–5.1)
Sodium: 137 mmol/L (ref 135–145)

## 2014-07-30 LAB — CBC
HEMATOCRIT: 34.7 % — AB (ref 36.0–46.0)
HEMOGLOBIN: 11.3 g/dL — AB (ref 12.0–15.0)
MCH: 29.7 pg (ref 26.0–34.0)
MCHC: 32.6 g/dL (ref 30.0–36.0)
MCV: 91.1 fL (ref 78.0–100.0)
Platelets: 432 10*3/uL — ABNORMAL HIGH (ref 150–400)
RBC: 3.81 MIL/uL — ABNORMAL LOW (ref 3.87–5.11)
RDW: 13.4 % (ref 11.5–15.5)
WBC: 11.9 10*3/uL — ABNORMAL HIGH (ref 4.0–10.5)

## 2014-07-30 LAB — TROPONIN I: Troponin I: <0.03 ng/mL (ref <0.031)

## 2014-07-30 MED ORDER — SODIUM CHLORIDE 0.9 % IV BOLUS (SEPSIS)
500.0000 mL | Freq: Once | INTRAVENOUS | Status: AC
  Administered 2014-07-30: 500 mL via INTRAVENOUS

## 2014-07-30 NOTE — Progress Notes (Signed)
Spoke with Dr. Delsa Sale about results from EKG,  Blood work, and vitals.Sarah [name]

## 2014-07-30 NOTE — Progress Notes (Signed)
2 Days Post-Op Procedure(s) (LRB): ROBOTIC ASSISTED lysis of adhesions with biopsies, converted to LAPAROTOMY, bilateral salpingoorphorectomy, omentectomy,appendectomy (Bilateral)  Subjective: Patient reports tolerating diet with "a little nausea" but no emesis.  Up with assistance with weak legs only twice yesterday.  Pain controlled with PRN medications.  No concerns voiced.    Objective: Vital signs in last 24 hours: Temp:  [97.6 F (36.4 C)-98.2 F (36.8 C)] 97.8 F (36.6 C) (02/20 0518) Pulse Rate:  [50-73] 51 (02/20 0518) Resp:  [18] 18 (02/20 0518) BP: (123-151)/(66-77) 134/70 mmHg (02/20 0518) SpO2:  [89 %-99 %] 95 % (02/20 0518) Last BM Date: 07/27/14  Intake/Output from previous day: 02/19 0701 - 02/20 0700 In: 500 [P.O.:300; I.V.:200] Out: 1000 [Urine:1000]  Physical Examination: General: alert, cooperative, no distress and soft spoken due to sore throat post-op Resp: clear to auscultation bilaterally Cardio: regular rate and rhythm, S1, S2 normal, no murmur, click, rub or gallop GI: soft, non-tender; bowel sounds normal; slightly distended, no masses,  no organomegaly and incision: midline incision with staples, no drainage, dressing removed Extremities: extremities normal, atraumatic, no cyanosis or edema  Labs:     None from today  Assessment: 49 y.o. s/p Procedure(s): ROBOTIC ASSISTED lysis of adhesions with biopsies, converted to LAPAROTOMY, bilateral salpingoorphorectomy, omentectomy,appendectomy: stable Pain:  Pain is well-controlled on PRN medications.  Heme: Hgb 11.3 and Hct 34.9 yesterday.  Stable post-operatively.  CV: BP and HR stable.  Home medications reordered- Lisinopril-HCTZ, Norvasc, Metoprolol.  GI:  Tolerating po: Yes     GU: Adequate output reported.    FEN: Stable post-operatively.  Prophylaxis: pharmacologic prophylaxis (with any of the following: enoxaparin (Lovenox) 40mg  SQ 2 hours prior to surgery then every day) and intermittent  pneumatic compression boots.  Plan:  Diet as tolerated Encourage ambulation, IS use, deep breathing, and coughing Continue inpatient care today. Plan for discharge to home tomorrow. 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 Will plan for chemotherapy postop.   LOS: 2 days    Donaciano Eva 07/30/2014, 7:57 AM

## 2014-07-30 NOTE — Progress Notes (Signed)
Pt HR in 40-50 and pt starting to having dizziness.  BP has remained stable with last BP reading of 130/79; MD paged to make aware.  MD updated orders of 12 lead EKG, 500 ml bolus, d/c Norvac, continue to hold Lopressor;  and to call MD with results of 12 lead.  RN updated orders and shared with night RN.  Pt educated, call light in reach, will continue to monitor.

## 2014-07-31 LAB — TSH: TSH: 1.86 u[IU]/mL (ref 0.350–4.500)

## 2014-07-31 LAB — BASIC METABOLIC PANEL
Anion gap: 9 (ref 5–15)
BUN: 16 mg/dL (ref 6–23)
CO2: 31 mmol/L (ref 19–32)
Calcium: 9.1 mg/dL (ref 8.4–10.5)
Chloride: 98 mmol/L (ref 96–112)
Creatinine, Ser: 0.88 mg/dL (ref 0.50–1.10)
GFR calc Af Amer: 89 mL/min — ABNORMAL LOW (ref >90)
GFR, EST NON AFRICAN AMERICAN: 76 mL/min — AB (ref >90)
Glucose, Bld: 123 mg/dL — ABNORMAL HIGH (ref 70–99)
Potassium: 3.3 mmol/L — ABNORMAL LOW (ref 3.5–5.1)
Sodium: 138 mmol/L (ref 135–145)

## 2014-07-31 MED ORDER — OXYCODONE HCL 10 MG PO TABS: 10.0000 mg | ORAL_TABLET | ORAL | Status: DC | PRN

## 2014-07-31 MED ORDER — IBUPROFEN 800 MG PO TABS: 800.0000 mg | ORAL_TABLET | Freq: Four times a day (QID) | ORAL | Status: DC

## 2014-07-31 MED ORDER — KCL IN DEXTROSE-NACL 20-5-0.45 MEQ/L-%-% IV SOLN
INTRAVENOUS | Status: DC
  Administered 2014-07-31: 03:00:00 via INTRAVENOUS
  Filled 2014-07-31 (×3): qty 1000

## 2014-07-31 MED ORDER — ONDANSETRON HCL 4 MG PO TABS: 4.0000 mg | ORAL_TABLET | Freq: Four times a day (QID) | ORAL | Status: DC | PRN

## 2014-07-31 MED ORDER — POTASSIUM CHLORIDE CRYS ER 20 MEQ PO TBCR
40.0000 meq | EXTENDED_RELEASE_TABLET | Freq: Once | ORAL | Status: AC
  Administered 2014-07-31: 40 meq via ORAL
  Filled 2014-07-31: qty 2

## 2014-07-31 MED ORDER — POTASSIUM CHLORIDE 20 MEQ PO PACK
40.0000 meq | PACK | Freq: Once | ORAL | Status: DC
  Filled 2014-07-31: qty 2

## 2014-07-31 MED ORDER — ENOXAPARIN SODIUM 40 MG/0.4ML ~~LOC~~ SOLN: 40.0000 mg | SUBCUTANEOUS | Status: DC

## 2014-07-31 NOTE — Progress Notes (Signed)
3 Days Post-Op Procedure(s) (LRB): ROBOTIC ASSISTED lysis of adhesions with biopsies, converted to LAPAROTOMY, bilateral salpingoorphorectomy, omentectomy,appendectomy (Bilateral)  Subjective: Patient reports small volume emesis last night.  Passed flatus this morning. Has not yet had breakfast.  Pain controlled with PRN medications. Cramping pain in abdomen.  Objective: Vital signs in last 24 hours: Temp:  [97 F (36.1 C)-98.1 F (36.7 C)] 98 F (36.7 C) (02/21 2376) Pulse Rate:  [47-69] 66 (02/21 0608) Resp:  [16-20] 18 (02/21 0608) BP: (116-154)/(56-84) 128/84 mmHg (02/21 0608) SpO2:  [95 %-100 %] 98 % (02/21 0608) Last BM Date: 07/27/14  Intake/Output from previous day: 02/20 0701 - 02/21 0700 In: 1278.3 [P.O.:960; I.V.:318.3] Out: 1550 [Urine:1550]  Physical Examination: General: alert, cooperative, no distress and soft spoken due to sore throat post-op Resp: clear to auscultation bilaterally Cardio: regular rate and rhythm, S1, S2 normal, no murmur, click, rub or gallop GI: soft, non-tender; bowel sounds normal; slightly distended, no masses,  no organomegaly and incision: midline incision with staples, no drainage, dressing removed Extremities: extremities normal, atraumatic, no cyanosis or edema  Labs: WBC/Hgb/Hct/Plts:  11.9/11.3/34.7/432 (02/20 2028) BUN/Cr/glu/ALT/AST/amyl/lip:  22/1.11/--/--/--/--/-- (02/20 2028) None from today  Assessment: 49 y.o. s/p Procedure(s): ROBOTIC ASSISTED lysis of adhesions with biopsies, converted to LAPAROTOMY, bilateral salpingoorphorectomy, omentectomy,appendectomy: stable Pain:  Pain is well-controlled on PRN medications.  Heme: Hgb 11.3 and Hct 34.9 yesterday.  Stable post-operatively.  CV: BP and HR stable.  Home medications reordered- Lisinopril-HCTZ, Norvasc, Metoprolol. Bradycardic yesterday, likely secondary to metoprolol. Of note, patient was recently admitted to the hospital for a hypertensive crisis and her BP meds are  relatively new.   GI:  Tolerating po: Yes. Emesis last night, but flatus this morning suggests return of bowel function.     GU: Adequate output reported. Creatinine elevated to 1.1 yesterday. Will recheck this morning prior to discharge. Encourage hydration.   FEN: Stable post-operatively.  Prophylaxis: pharmacologic prophylaxis (with any of the following: enoxaparin (Lovenox) 40mg  SQ 2 hours prior to surgery then every day) and intermittent pneumatic compression boots.  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 Will plan for chemotherapy postop.   LOS: 3 days    Sarah [name] 07/31/2014, 9:34 AM

## 2014-07-31 NOTE — Discharge Instructions (Signed)
07/31/2014  Return to work: 6 weeks  Activity: 1. Be up and out of the bed during the day.  Take a nap if needed.  You may walk up steps but be careful and use the hand rail.  Stair climbing will tire you more than you think, you may need to stop part way and rest.   2. No lifting or straining for 6 weeks.  3. No driving for 2 weeks.  Do Not drive if you are taking narcotic pain medicine.  4. Shower daily.  Use soap and water on your incision and pat dry; don't rub.   5. No sexual activity and nothing in the vagina for 4 weeks.  Diet: 1. Low sodium Heart Healthy Diet is recommended.  2. It is safe to use a laxative if you have difficulty moving your bowels.   Wound Care: 1. Keep clean and dry.  Shower daily.  Reasons to call the Doctor:   Fever - Oral temperature greater than 100.4 degrees Fahrenheit  Foul-smelling vaginal discharge  Difficulty urinating  Nausea and vomiting  Increased pain at the site of the incision that is unrelieved with pain medicine.  Difficulty breathing with or without chest pain  New calf pain especially if only on one side  Sudden, continuing increased vaginal bleeding with or without clots.   Follow-up: 1. See Joylene John on 08/05/14 for staples removal (in [facility]).  Contacts: For questions or concerns you should contact:  Dr. Everitt Amber at [phone]

## 2014-07-31 NOTE — Progress Notes (Signed)
Sarah [name] [age] D/C'D HOME -STILL HAVING SOME NAUSEA, NO VOMITING. TALKED WITH DR. [name]- SHE FELT SHE IS OK TO GO HOME. DISCUSSED D/C INSTRUCTIONS AND PATIENT SIGNED. IV D/C'D. D/C HOME WITH HER SISTER.

## 2014-07-31 NOTE — Discharge Summary (Signed)
Physician Discharge Summary  Patient ID: Sarah [name] MRN: [id number] DOB/AGE: [birth year] 49 y.o.  Admit date: 07/28/2014 Discharge date: 07/31/2014  Admission Diagnoses: <principal problem not specified>  Discharge Diagnoses:  Active Problems:   Pelvic mass in female   Discharged Condition: good  Hospital Course: patient was admitted on 07/28/14 with a pelvic mass for surgery. She underwent a robotic surgery converted exploratory laparotomy and BSO, omentectomy for serous carcinoma of the ovary. Postoperatively she had an uncomplicated course.  Consults: None  Significant Diagnostic Studies: labs: appropriate postop Hb. Postop creatinine mildly elevated at 1.1  Treatments: surgery: see above  Discharge Exam: Blood pressure 183/87, pulse 64, temperature 98.2 F (36.8 C), temperature source Oral, resp. rate 18, height 5\' 5"  (1.651 m), weight 217 lb (98.431 kg), SpO2 97 %. General appearance: alert and cooperative Resp: clear to auscultation bilaterally Cardio: regular rate and rhythm, S1, S2 normal, no murmur, click, rub or gallop GI: soft, non-tender; bowel sounds normal; no masses,  no organomegaly Pelvic: vagina normal without discharge  Disposition: 01-Home or Self Care  Discharge Instructions    (HEART FAILURE PATIENTS) Call MD:  Anytime you have any of the following symptoms: 1) 3 pound weight gain in 24 hours or 5 pounds in 1 week 2) shortness of breath, with or without a dry hacking cough 3) swelling in the hands, feet or stomach 4) if you have to sleep on extra pillows at night in order to breathe.    Complete by:  As directed      Call MD for:  difficulty breathing, headache or visual disturbances    Complete by:  As directed      Call MD for:  extreme fatigue    Complete by:  As directed      Call MD for:  hives    Complete by:  As directed      Call MD for:  persistant dizziness or light-headedness    Complete by:  As directed      Call MD for:  persistant  nausea and vomiting    Complete by:  As directed      Call MD for:  redness, tenderness, or signs of infection (pain, swelling, redness, odor or green/yellow discharge around incision site)    Complete by:  As directed      Call MD for:  severe uncontrolled pain    Complete by:  As directed      Call MD for:  temperature >100.4    Complete by:  As directed      Diet - low sodium heart healthy    Complete by:  As directed      Diet general    Complete by:  As directed      Driving Restrictions    Complete by:  As directed   No driving for 7 days or until off narcotic pain medication     Increase activity slowly    Complete by:  As directed      Remove dressing in 24 hours    Complete by:  As directed      Sexual Activity Restrictions    Complete by:  As directed   No intercourse for 6 weeks            Medication List    TAKE these medications        acetaminophen 500 MG tablet  Commonly known as:  TYLENOL  Take 1,000 mg by mouth every 6 (six) hours as needed for  mild pain or headache.     amLODipine 10 MG tablet  Commonly known as:  NORVASC  Take 1 tablet (10 mg total) by mouth daily.     atorvastatin 20 MG tablet  Commonly known as:  LIPITOR  Take 1 tablet (20 mg total) by mouth daily.     enoxaparin 40 MG/0.4ML injection  Commonly known as:  LOVENOX  Inject 0.4 mLs (40 mg total) into the skin daily.     ibuprofen 800 MG tablet  Commonly known as:  ADVIL,MOTRIN  Take 1 tablet (800 mg total) by mouth every 6 (six) hours.     lisinopril-hydrochlorothiazide 10-12.5 MG per tablet  Commonly known as:  PRINZIDE,ZESTORETIC  Take 1 tablet by mouth daily.     metoprolol tartrate 25 MG tablet  Commonly known as:  LOPRESSOR  Take 0.5 tablets (12.5 mg total) by mouth 2 (two) times daily.     ondansetron 4 MG tablet  Commonly known as:  ZOFRAN  Take 1 tablet (4 mg total) by mouth every 6 (six) hours as needed for nausea.     Oxycodone HCl 10 MG Tabs  Take 1 tablet  (10 mg total) by mouth every 4 (four) hours as needed for breakthrough pain.     oxyCODONE-acetaminophen 5-325 MG per tablet  Commonly known as:  PERCOCET/ROXICET  Take 1 tablet by mouth every 4 (four) hours as needed for moderate pain.           Follow-up Information    Follow up with CROSS, MELISSA DEAL, NP On 08/05/2014.   Specialty:  Gynecologic Oncology   Why:  For suture removal   Contact information:   [address] [address] [phone]       Signed: Donaciano Eva 07/31/2014, 9:41 AM

## 2014-07-31 NOTE — Progress Notes (Signed)
Pt has had 2 episodes of vomiting. MD notified. IV fluids ordered.  Sarah [name]

## 2014-07-31 NOTE — Progress Notes (Signed)
CARE MANAGEMENT NOTE 07/31/2014  Patient:  DORRENE, BENTLY   Account Number:  [id number]  Date Initiated:  07/29/2014  Documentation initiated by:  Sunday Spillers  Subjective/Objective Assessment:   49 yo female admitted s/p open laporotomy, debulking. PTA lived at home with spouse.     Action/Plan:   Home when stable   Anticipated DC Date:  08/01/2014   Anticipated DC Plan:  [place]  CM consult  Medication Assistance  [place] Program      Choice offered to / List presented to:             Status of service:  Completed, signed off Medicare Important Message given?   (If response is "NO", the following Medicare IM given date fields will be blank) Date Medicare IM given:   Medicare IM given by:   Date Additional Medicare IM given:   Additional Medicare IM given by:    Discharge Disposition:  HOME/SELF CARE  Per UR Regulation:  Reviewed for med. necessity/level of care/duration of stay  If discussed at [place] of Stay Meetings, dates discussed:    Comments:  07/31/2014 1500 NCM spoke to pt and states she picks up her meds from [facility]. Unable to contact [facility] to determine if they stock Lovenox. Provided pt with MATCH with $3 copay. Explained program is offered once per year. Pt states she has an appt on 08/11/2014 at 1230 pm. Contacted [facility] and they do have called in Rx from attending. Lovenox price is $230 at pharmacy. Jonnie Finner RN CCM Case Mgmt phone [phone]

## 2014-08-01 ENCOUNTER — Ambulatory Visit: Payer: MEDICAID | Admitting: Gynecologic Oncology

## 2014-08-03 ENCOUNTER — Ambulatory Visit: Payer: Medicaid Other | Attending: Gynecologic Oncology | Admitting: Gynecologic Oncology

## 2014-08-03 ENCOUNTER — Encounter: Payer: Self-pay | Admitting: Gynecologic Oncology

## 2014-08-03 ENCOUNTER — Other Ambulatory Visit: Payer: Self-pay | Admitting: *Deleted

## 2014-08-03 VITALS — BP 155/93 | HR 52 | Temp 97.9°F | Resp 20 | Ht 65.0 in | Wt 213.6 lb

## 2014-08-03 DIAGNOSIS — C569 Malignant neoplasm of unspecified ovary: Secondary | ICD-10-CM | POA: Diagnosis not present

## 2014-08-03 DIAGNOSIS — Z8041 Family history of malignant neoplasm of ovary: Secondary | ICD-10-CM

## 2014-08-03 DIAGNOSIS — Z483 Aftercare following surgery for neoplasm: Secondary | ICD-10-CM

## 2014-08-03 DIAGNOSIS — C561 Malignant neoplasm of right ovary: Secondary | ICD-10-CM | POA: Insufficient documentation

## 2014-08-03 MED ORDER — AMLODIPINE BESYLATE 10 MG PO TABS: 10.0000 mg | ORAL_TABLET | Freq: Every day | ORAL | Status: DC

## 2014-08-03 MED ORDER — METOPROLOL TARTRATE 25 MG PO TABS: 12.5000 mg | ORAL_TABLET | Freq: Two times a day (BID) | ORAL | Status: DC

## 2014-08-03 NOTE — Patient Instructions (Signed)
You will receive a call from the [facility] for an appointment with [name] (who will prescribe the chemotherapy) and also for a genetic counseling appointment.

## 2014-08-03 NOTE — Progress Notes (Signed)
POSTOPERATIVE FOLLOWUP. TREATMENT COUNSELING.  HPI:  Sarah [name] is a 49 y.o. year old initially seen in consultation on 07/08/14 for bilateral ovarian masses and ascites. This was diagnosed at the time of admission to the hospital for hypertensive crisis. This diagnosis of hypertension was new and she was newly started on a medical regimen for this. She then underwent an exploratory laparotomy, BSO, omentectomy, appendectomy on 3/54/65 without complications.  Her postoperative course was uncomplicated.  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 This represented a stage IIIc disease process.  She is seen today for a postoperative check and to discuss her pathology results and ongoing plan.  Since discharge from the hospital, she is feeling well.  She has improving appetite, normal bowel and bladder function, and pain controlled with minimal PO medication. She has no other complaints today.    Review of systems: Constitutional:  She has no weight gain or weight loss. She has no fever or chills. Eyes: No blurred vision Ears, Nose, Mouth, Throat: No dizziness, headaches or changes in hearing. No mouth sores. Cardiovascular: No chest pain, palpitations or edema. Respiratory:  No shortness of breath, wheezing or cough Gastrointestinal: She has normal bowel movements without diarrhea or constipation. She denies any nausea or vomiting. She denies blood in her stool or heart burn. Genitourinary:  She denies pelvic pain, pelvic pressure or changes in her urinary function. She has no hematuria, dysuria, or incontinence. She has no irregular vaginal bleeding or vaginal discharge Musculoskeletal: Denies muscle weakness or joint pains.  Skin:  She has no skin changes, rashes or itching Neurological:  Denies dizziness or headaches. No neuropathy, no numbness or tingling. Psychiatric:  She denies  depression or anxiety. Hematologic/Lymphatic:   No easy bruising or bleeding   Physical Exam: Blood pressure 155/93, pulse 52, temperature 97.9 F (36.6 C), temperature source Oral, resp. rate 20, height 5\' 5"  (1.651 m), weight 213 lb 9.6 oz (96.888 kg). General: Well dressed, well nourished in no apparent distress.   HEENT:  Normocephalic and atraumatic, no lesions.  Extraocular muscles intact. Sclerae anicteric. Pupils equal, round, reactive. No mouth sores or ulcers. Thyroid is normal size, not nodular, midline. Skin:  No lesions or rashes. Breasts:  Soft, symmetric.  No skin or nipple changes.  No palpable LN or masses. Lungs:  Clear to auscultation bilaterally.  No wheezes. Cardiovascular:  Regular rate and rhythm.  No murmurs or rubs. Abdomen:  Soft, nontender, nondistended.  No palpable masses.  No hepatosplenomegaly.  No ascites. Normal bowel sounds.  No hernias.  Incision is clean dry and in tact with staples which were removed. Genitourinary: Normal EGBUS  Vaginal cuff intact.  No bleeding or discharge.  No cul de sac fullness. Extremities: No cyanosis, clubbing or edema.  No calf tenderness or erythema. No palpable cords. Psychiatric: Mood and affect are appropriate. Neurological: Awake, alert and oriented x 3. Sensation is intact, no neuropathy.  Musculoskeletal: No pain, normal strength and range of motion.  Assessment:    49 y.o. 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   S/p BSO, omentectomy, appendectomy on 07/28/14.   Plan: 1) Pathology reports reviewed today 2) Treatment counseling - explain to Sarah [name] and her sister who was present today the pathology reports. I discussed the metastatic nature of her disease. I discussed that there was no visible macroscopic disease remaining at the completion of her surgery however the fact that  she has stage IIIc disease means that her disease remains in microscopic form. It is for this reason that were  recommending adjuvant chemotherapy as without adjuvant chemotherapy her cancer will certainly recur. I discussed that low-grade cancers are rare form of ovarian cancer and can be somewhat more difficult to treat as they demonstrate potentially poor response to conventional chemotherapy. However conventional chemotherapy with carboplatin and Taxol continues to remain first-line standard of care and we are recommending 6 cycles of this be administered. I will facilitate referral to our medical oncology colleague Dr.Livesay.   Given the low-grade nature of this tumor and reports that show good response/control with hormonal modulation. After she is completed cytotoxic chemotherapy I'm recommending maintenance therapy with Megace.  Given her diagnosis of ovarian cancer in her young age at presentation I'm recommending referral to genetics. Of note the patient has a family history on her mother's side which is significant for multiple cancers including ovarian cancer. She was given the opportunity to ask questions, which were answered to her satisfaction, and she is agreement with the above mentioned plan of care.  3)  Return to clinic after completing 6 cycles of carboplatin and paclitaxel chemotherapy  Donaciano Eva, MD

## 2014-08-04 ENCOUNTER — Telehealth: Payer: Self-pay | Admitting: Genetic Counselor

## 2014-08-04 NOTE — Telephone Encounter (Signed)
Lt mess for appt on 08/10/14 at 11 w/ Genetics Dx: Genetic Testing Referring Dr. [name]

## 2014-08-05 ENCOUNTER — Encounter: Payer: Self-pay | Admitting: *Deleted

## 2014-08-05 ENCOUNTER — Telehealth: Payer: Self-pay | Admitting: Genetic Counselor

## 2014-08-05 NOTE — Telephone Encounter (Signed)
Pt called to resched appt due to transportation issues changed to 08/25/14 at 2:30

## 2014-08-05 NOTE — Progress Notes (Signed)
[place] Work  Clinical Social Work was referred by [place] for assessment of psychosocial needs.  Clinical Social Worker contacted patient by phone to offer support and assess for needs.  [name] shared she is meeting with someone from [facility] to apply for Medicaid and SSDisability.  She shared that was her main concern at this time.  CSW encouraged patient to keep this appointment and to document any information she receives including contact information to follow up on status.  CSW strongly encouraged patient to follow up once appointment with medical oncologist has been scheduled.  [name] verbalized understanding and was appreciative of CSW call.  Polo Riley, MSW, LCSW, OSW-C Clinical Social Worker [facility] [phone]

## 2014-08-09 ENCOUNTER — Telehealth: Payer: Self-pay | Admitting: Oncology

## 2014-08-09 NOTE — Telephone Encounter (Signed)
Left pt vm in ref to np appt.08/19/14@10 :30 Chemo ed 08/16/14@12 :00 Asked pt to call and confirm appt.

## 2014-08-10 ENCOUNTER — Other Ambulatory Visit: Payer: Self-pay

## 2014-08-10 ENCOUNTER — Encounter: Payer: Self-pay | Admitting: Genetic Counselor

## 2014-08-11 ENCOUNTER — Ambulatory Visit: Payer: MEDICAID | Attending: Internal Medicine

## 2014-08-16 ENCOUNTER — Other Ambulatory Visit: Payer: Self-pay

## 2014-08-16 ENCOUNTER — Encounter: Payer: Self-pay | Admitting: *Deleted

## 2014-08-17 ENCOUNTER — Other Ambulatory Visit: Payer: Self-pay | Admitting: Oncology

## 2014-08-17 DIAGNOSIS — C569 Malignant neoplasm of unspecified ovary: Secondary | ICD-10-CM

## 2014-08-19 ENCOUNTER — Encounter: Payer: Self-pay | Admitting: Oncology

## 2014-08-19 ENCOUNTER — Ambulatory Visit (HOSPITAL_BASED_OUTPATIENT_CLINIC_OR_DEPARTMENT_OTHER): Payer: Medicaid Other | Admitting: Oncology

## 2014-08-19 ENCOUNTER — Other Ambulatory Visit (HOSPITAL_BASED_OUTPATIENT_CLINIC_OR_DEPARTMENT_OTHER): Payer: Medicaid Other

## 2014-08-19 ENCOUNTER — Telehealth: Payer: Self-pay | Admitting: Oncology

## 2014-08-19 ENCOUNTER — Ambulatory Visit: Payer: Self-pay

## 2014-08-19 ENCOUNTER — Telehealth: Payer: Self-pay | Admitting: *Deleted

## 2014-08-19 VITALS — BP 149/75 | HR 72 | Temp 98.3°F | Resp 18 | Ht 65.0 in | Wt 212.1 lb

## 2014-08-19 DIAGNOSIS — C481 Malignant neoplasm of specified parts of peritoneum: Secondary | ICD-10-CM

## 2014-08-19 DIAGNOSIS — M545 Low back pain: Secondary | ICD-10-CM | POA: Diagnosis not present

## 2014-08-19 DIAGNOSIS — C569 Malignant neoplasm of unspecified ovary: Secondary | ICD-10-CM

## 2014-08-19 DIAGNOSIS — I10 Essential (primary) hypertension: Secondary | ICD-10-CM | POA: Diagnosis not present

## 2014-08-19 DIAGNOSIS — C482 Malignant neoplasm of peritoneum, unspecified: Secondary | ICD-10-CM

## 2014-08-19 LAB — COMPREHENSIVE METABOLIC PANEL (CC13)
ALT: 38 U/L (ref 0–55)
ANION GAP: 9 meq/L (ref 3–11)
AST: 31 U/L (ref 5–34)
Albumin: 3.7 g/dL (ref 3.5–5.0)
Alkaline Phosphatase: 49 U/L (ref 40–150)
BUN: 21.2 mg/dL (ref 7.0–26.0)
CHLORIDE: 106 meq/L (ref 98–109)
CO2: 26 meq/L (ref 22–29)
Calcium: 9.7 mg/dL (ref 8.4–10.4)
Creatinine: 1.1 mg/dL (ref 0.6–1.1)
EGFR: 71 mL/min/{1.73_m2} — ABNORMAL LOW (ref >90)
GLUCOSE: 112 mg/dL (ref 70–140)
POTASSIUM: 4 meq/L (ref 3.5–5.1)
SODIUM: 140 meq/L (ref 136–145)
TOTAL PROTEIN: 8 g/dL (ref 6.4–8.3)
Total Bilirubin: 0.33 mg/dL (ref 0.20–1.20)

## 2014-08-19 LAB — CBC WITH DIFFERENTIAL/PLATELET
BASO%: 0.6 % (ref 0.0–2.0)
Basophils Absolute: 0 10*3/uL (ref 0.0–0.1)
EOS%: 2.1 % (ref 0.0–7.0)
Eosinophils Absolute: 0.1 10*3/uL (ref 0.0–0.5)
HCT: 37.6 % (ref 34.8–46.6)
HGB: 12 g/dL (ref 11.6–15.9)
LYMPH%: 34.2 % (ref 14.0–49.7)
MCH: 28.4 pg (ref 25.1–34.0)
MCHC: 31.9 g/dL (ref 31.5–36.0)
MCV: 88.9 fL (ref 79.5–101.0)
MONO#: 0.9 10*3/uL (ref 0.1–0.9)
MONO%: 14.7 % — AB (ref 0.0–14.0)
NEUT#: 3 10*3/uL (ref 1.5–6.5)
NEUT%: 48.4 % (ref 38.4–76.8)
PLATELETS: 368 10*3/uL (ref 145–400)
RBC: 4.23 10*6/uL (ref 3.70–5.45)
RDW: 14.4 % (ref 11.2–14.5)
WBC: 6.3 10*3/uL (ref 3.9–10.3)
lymph#: 2.1 10*3/uL (ref 0.9–3.3)

## 2014-08-19 NOTE — Telephone Encounter (Signed)
Per staff message and POF I have scheduled appts. Advised scheduler of appts. JMW  

## 2014-08-19 NOTE — Progress Notes (Signed)
[place] NEW PATIENT EVALUATION   Name: Sarah [name] Date: August 19, 2014  MRN: [id number] DOB: 1965-06-14  REFERRING PHYSICIAN: Terrence Dupont Rossi/ W. Brewster BO:FBPZWC, Marlena Clipper, MD (PCP [facility])   REASON FOR REFERRAL:IIIC  low grade serous primary peritoneal carcinoma   HISTORY OF PRESENT ILLNESS:Sarah [name] is a 49 y.o. 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  Patient presented to ED 06-28-14 with fairly acute onset of low back pain, without known trauma, so severe at that time that she was having difficulty standing and walking; blood pressure in ED was 260/148 such that she was admitted for hypertensive urgency. Evaluation included CT CAP 06-28-14 which demonstrated a 4.4 cm right adnexal mass, with stranding in omentum and small fluid in pelvis, as well as mild mediastinal and hilar adenopathy. US showed 4.6 x 5.2 x 3.8 cm apparent complex ovarian mass. CA 125 was 58 on 06-30-14. She was seen in consultation by Dr [name] on 07-08-14.  Imaging otherwise showed no aortic aneurysm; MRI of L spine 06-29-14 found some facet hypertrophy and small right paracentral disc protrusion L5S1. Blood pressure was treated and patient established with [facility], Dr Doreene Burke.  She was stable for surgery by Dr Skeet Latch 07-28-14, which was exploratory laparotomy with BSO, infragastric omentectomy, washings and biopsies and appendectomy. Pathology [phone]) found low grade serous carcinoma thruout. Post operative course was unremarkable. She saw Dr [name] on 08-03-14, with recommendation for 6 cycles of taxol and carboplatin then consideration of Megace maintenance; she will see Dr [name] again after chemo completes. Genetics referral was  recommended, particularly with family history. Patient attended chemotherapy education class priro to this visit.   REVIEW OF SYSTEMS: Still some abdominal soreness and still low back pain, tho not as severe as at presentation. Back pain is better when sitting or lying, worse with standing, no history of trauma, no radiation to LE, ibuprofen did not help when tried initially. Some constipation after surgery, better with stool softeners. Appetite ~ usual now. Weight down ~ 20 lbs thru this illness. No HA. Wears glasses. No sinus symptoms, no difficulty hearing, no dental problems, no thyroid problems. No respiratory symptoms. Blood pressure at home ~ 124/75. No chest pain or palpitations. No changes noted in breasts. Occasional GERD, no meds used. No bladder symptoms. No arthritis. No swelling in LE, no history blood clots, no bleeding. No peripheral neuropathy symptoms.   Remainder of full 10 point review of systems negative.   ALLERGIES: Review of patient's allergies indicates no known allergies.  PAST MEDICAL/ SURGICAL HISTORY:    G2P2, sons ages 67 and 81 Used inhaler before thyroid surgery but no history of asthma HTN x 5-6 years, stopped medications when lost job and insurance. Partial thyroidectomy for goiter ~ 2010, never on thyroid supplement. Mammograms done at [facility] 12-2012, scattered fibroglandular density and no evidence of malignancy  CURRENT MEDICATIONS: reviewed as listed now in EMR. Will increase zofran to 8 mg q 8 hrs prn nausea with chemo. Prescriptions to be sent for decadron, ativan and will refill zofran when due for 8 mg tablets.  [facility]ACY[facility]   SOCIAL HISTORY:  [place] native. Medicaid pending. No tobacco, no ETOH, no drugs. 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  FAMILY HISTORY:  Sister with fibrosarcoma on back treated surgically "refused chemo" 2 brothers healthy 2 sons healthy Mother  HTN Father HTN, DM, CAD Maternal uncle prostate ca Maternal great aunts "ovarian and stomach cancer"         PHYSICAL EXAM:  height is _0  (1.651 m) and weight is 212 lb 1.6 oz (96.208 kg). Her oral temperature is 98.3 F (36.8 C). Her blood pressure is 149/75 and her pulse is 72. Her respiration is 18 and oxygen saturation is 100%.  Alert, pleasant, cooperative lady, looks stated age, good historian, sister very supportive. Appears comfortable, respirations not labored RA, easily mobile.  HEENT:normal hair pattern, PERRL, not icteric. Oral mucosa moist and clear. Neck supple without JVD or thyroid mass. Well healed incision from thyroid surgery.  RESPIRATORY:lungs clear to A and P  CARDIAC/ VASCULAR:heart RRR no murmiur or gallop. Peripheral pulses intact and symmetrical  ABDOMEN:soft, nontender, midline incision healing well, no erythema/ heat/ unusual tenderness. No palpable HSM or mass. Normal bowel sounds. No distension  LYMPH NODES:no cervical, supraclavicular, axillary or inguinal adenopathy BREASTS:bilaterally without dominant mass, skin or nipple findings and axillae benign   NEUROLOGIC: CN, motor, sensory, cerebellar nonfocal. PSYCH appropriate mood and affect  SKIN:without rash, ecchymosis, petechiae  MUSCULOSKELETAL: back minimally tender to palpation at LS, none at SI joints. LE without edema, cords, tenderness.    LABORATORY DATA:  Results for orders placed or performed in visit on 08/19/14 (from the past 48 hour(s))  CBC with Differential     Status: Abnormal   Collection Time: 08/19/14 10:53 AM  Result Value Ref Range   WBC 6.3 3.9 - 10.3 10e3/uL   NEUT# 3.0 1.5 - 6.5 10e3/uL   HGB 12.0 11.6 - 15.9 g/dL   HCT 37.6 34.8 - 46.6 %   Platelets 368 145 - 400 10e3/uL   MCV 88.9 79.5 - 101.0 fL   MCH 28.4 25.1 - 34.0 pg   MCHC 31.9 31.5 - 36.0 g/dL   RBC 4.23 3.70 - 5.45 10e6/uL   RDW 14.4 11.2 - 14.5 %   lymph# 2.1 0.9 - 3.3 10e3/uL   MONO# 0.9 0.1 - 0.9  10e3/uL   Eosinophils Absolute 0.1 0.0 - 0.5 10e3/uL   Basophils Absolute 0.0 0.0 - 0.1 10e3/uL   NEUT% 48.4 38.4 - 76.8 %   LYMPH% 34.2 14.0 - 49.7 %   MONO% 14.7 (H) 0.0 - 14.0 %   EOS% 2.1 0.0 - 7.0 %   BASO% 0.6 0.0 - 2.0 %  Comprehensive metabolic panel (Cmet) - [facility]     Status: Abnormal   Collection Time: 08/19/14 10:54 AM  Result Value Ref Range   Sodium 140 136 - 145 mEq/L   Potassium 4.0 3.5 - 5.1 mEq/L   Chloride 106 98 - 109 mEq/L   CO2 26 22 - 29 mEq/L   Glucose 112 70 - 140 mg/dl   BUN 21.2 7.0 - 26.0 mg/dL   Creatinine 1.1 0.6 - 1.1 mg/dL   Total Bilirubin 0.33 0.20 - 1.20 mg/dL   Alkaline Phosphatase 49 40 - 150 U/L   AST 31 5 - 34 U/L   ALT 38 0 - 55 U/L   Total Protein 8.0 6.4 - 8.3 g/dL   Albumin 3.7 3.5 - 5.0 g/dL   Calcium 9.7 8.4 - 10.4 mg/dL   Anion Gap 9 3 - 11 mEq/L   EGFR 71 (L) >90 ml/min/1.73 m2    Comment: eGFR is calculated using  the CKD-EPI Creatinine Equation (2009)     CA 125 available after visit 27, this having been 15 preoperatively  PATHOLOGY: FINAL for TALEIGHA, PINSON ([id number]) Patient: ASHAYLA, SUBIA Collected: 07/28/2014 Client: [facility] Accession: [id number] Received: 07/28/2014 Janie Morning, MD REPORT OF SURGICAL PATHOLOGY FINAL DIAGNOSIS Diagnosis 1. Omentum, resection for tumor - POSITIVE FOR LOW GRADE SEROUS CARCINOMA WITH EXTENSIVE PSAMMOMA BODIES. 2. Ovary and fallopian tube, right - POSITIVE FOR LOW GRADE SEROUS CARCINOMA WITH EXTENSIVE PSAMMOMA BODIES. 3. Omentum, resection for tumor - POSITIVE FOR LOW GRADE SEROUS CARCINOMA WITH EXTENSIVE PSAMMOMA BODIES. 4. Fatty tissue - POSITIVE FOR LOW GRADE SEROUS CARCINOMA WITH EXTENSIVE PSAMMOMA BODIES. 5. Appendix, Other than Incidental - APPENDICEAL TISSUE WITH SEROSAL INVOLVEMENT BY LOW GRADE SEROUS CARCINOMA WITH EXTENSIVE PSAMMOMA BODIES. 6. Ovary and fallopian tube, left - OVARIAN TISSUE WITH MULTIPLE INCLUSION CYSTS AND POSITIVE FOR LOW GRADE  SEROUS CARCINOMA WITH EXTENSIVE PSAMMOMA BODIES. 7. Soft tissue, biopsy, right pelvic sidewall - FIBROADIPOSE TISSUE WITH FIBROSIS WITH THERMAL EFFECT. 8. Soft tissue, biopsy, left pelvic sidewall - MATURE ADIPOSE TISSUE, NO EVIDENCE OF MALIGNANCY. 9. Omentum, resection for tumor - POSITIVE FOR LOW GRADE SEROUS CARCINOMA WITH EXTENSIVE PSAMMOMA BODIES. 10. Omentum, resection for tumor - POSITIVE FOR LOW GRADE SEROUS CARCINOMA WITH EXTENSIVE PSAMMOMA BODIES. Microscopic Comment 3. PRIMARY PERITONEUM: Specimen(s): Bilateral ovaries and fallopian tubes, omentum and appendix. Procedure: (including lymph node sampling) Resection Primary tumor site (including laterality): Primary peritoneum. Ovarian surface involvement: Yes Ovarian capsule intact without fragmentation: N/A Maximum tumor size (cm): > 2 cm in greatest dimension; numerous deposits Histologic type: Serous carcinoma with extensive psammoma bodies (psammocarcinoma) Grade: Low grade Peritoneal implants: (specify invasive or non-invasive): Present, invasive Pelvic extension (list additional structures on separate lines and if involved): Involving serosal surface of bilateral ovaries and fallopian tubes. Lymph nodes: number examined N/A ; number positive N/A TNM code: pT3c, pNX, pMX FIGO Stage (based on pathologic findings, needs clinical correlation): IIIC Comments: Multiple additional blocks from specimens 3, 9 and 10 were submitted for microscopic examination. Overall, the specimens were sampled adequately. Sections show extensive formation of psammoma bodies with clusters of atypical glandular cells arranged in a papillary pattern invading into the peritoneal and appendiceal visceral surface. The appearances of the epithelial and desmoplastic patterns of neoplasia were foci showing small cellular papillae, solid nests with cribriform or slitlike spaces, gland formation and focal confluence of tumor aggregates. Psammoma bodies  were identified in a variable amount of the tumor, ranging from 50% to 75%. The psammomatous foci showed extensive psammoma bodies (> 75% of tumor aggregates), which were either acellular or within the neoplastic epithelium, sometimes surrounded by a single layer of tumor cells. There is mild to moderate cytologic atypia. The invasion is best demonstrated in specimens 3, 9 and 10. Two largest areas involved in the omentum measure 8.5 and 3.5 cm grossly. Immunohistochemical stains were performed and the neoplastic cells show the following immunoprofile: Pan cytokeratin: positive WT1: positive ER: positive, 70%, moderate staining intensity Calretinin: negative p53: 30%, positivity. Ki-67: 25% positivity The control stained appropriately. The overall findings are diagnostic for invasive low grade serous carcinoma with extensive psammoma bodies.     CYTOLOGY : EMMOGENE, SIMSON Collected: 07/28/2014 Client: [facility] Accession: [id number] Received: 07/28/2014 Janie Morning, MD CYTOPATHOLOGY REPORT Adequacy Reason Satisfactory For Evaluation. Diagnosis PERITONEAL WASHING, (SPECIMEN 1 OF 1 COLLECTED 07/28/14): ATYPICAL PAPILLARY PROLIFERATION WITH PSAMMOMA BODIES, CONSISTENT WITH SEROUS CARCINOMA.     RADIOLOGY   EXAM: CT ANGIOGRAPHY CHEST, ABDOMEN AND PELVIS  06-28-14  FINDINGS: CTA CHEST FINDINGS  There is no evidence of aortic dissection or intramural hematoma. There is no obvious filling defect in the pulmonary arterial tree.  There is circumferential wall thickening of the innominate artery resulting in mild narrowing. Little if any irregular plaque or atherosclerotic calcification is present in the thorax. Postoperative changes from left hemithyroidectomy are noted.  Several scattered mediastinal nodes are present. 1.6 cm short axis diameter paratracheal node on image 30. 1.1 cm right hilar node on image 47. 9 mm left hilar lymph node on image  45.  No pneumothorax. No pleural effusion.  Dependent atelectasis bilaterally with low volumes. 3 mm left lower lobe pulmonary nodule on image 43.  Review of the MIP images confirms the above findings.  CTA ABDOMEN AND PELVIS FINDINGS  There is no evidence of aortic aneurysm or aortic dissection.  Celiac is patent. Branch vessels are patent. There is mild narrowing at the origin of the SMA. Branch vessels are grossly patent.  IMA is diminutive and patent. Branch vessels are grossly patent.  Single renal arteries are patent.  Bilateral common, internal, and external iliac arteries are patent. Mild atherosclerotic changes with calcification in the bilateral internal iliac arteries.  Liver, gallbladder, spleen, pancreas, adrenal glands, and kidneys are within normal limits.  No abnormal retroperitoneal adenopathy.  There is an indeterminate lesion in the right adnexa measuring 4.4 cm with elevated Hounsfield unit measurements. Left adnexa is unremarkable other than calcifications.  There is an unusual stranding within the peritoneal fat of the omentum hand in the anterior lower abdomen and pelvis. Within the regions of stranding, there is hyperdense linear areas most likely calcification. There is a small amount of free fluid in the pelvis with the dependent layering calcification.  Bladder is within normal limits. Uterus is absent.  Appendix is within normal limits.  Review of the MIP images confirms the above findings.  IMPRESSION: There is no evidence of thoracic or abdominal aortic aneurysm.  There is noted to be circumferential narrowing of the innominate artery but this narrowing is not significant. It is associated with significant wall thickening. This may simply be due to atherosclerosis, however vasculitis is in the differential diagnosis including mycotic disorder.  There is mild abnormal mediastinal adenopathy and hilar  adenopathy. Inflammatory and malignant etiology are not excluded such as metastatic disease, mycobacterium infection, lymphoma, sarcoidosis.  3 mm left lower lobe pulmonary nodule. If the patient is at high risk for bronchogenic carcinoma, follow-up chest CT at 1 year is recommended. If the patient is at low risk, no follow-up is needed. This recommendation follows the consensus statement: Guidelines for Management of Small Pulmonary Nodules Detected on CT Scans: A Statement from the [employer] as published in Radiology 2005; 237:395-400.  Within the abdomen, there is stranding and calcification in the omental fat worrisome for peritoneal carcinomatosis. There is a small amount of free fluid with dependent calcification.  Indeterminate 4.4 cm right adnexal lesion. Pelvic ultrasound is recommended to further characterize. The uterus is notably absent.     EXAM: MRI LUMBAR SPINE WITHOUT CONTRAST  06-29-14  TECHNIQUE: Multiplanar, multisequence MR imaging of the lumbar spine was performed. No intravenous contrast was administered.  COMPARISON: Abdominal pelvic CT 06/28/2014.  FINDINGS: CT demonstrates 5 lumbar type vertebral bodies. The alignment is normal. There is no evidence of fracture or pars defect.  The conus medullaris extends to the L1 level and appears normal. No paraspinal abnormalities are identified.Cystic lesion in the right adnexa is again  demonstrated, measuring up to 4.9 cm. There is mild stranding in the surrounding pelvic fat.  There are no significant disc space findings from T11-12 through L2-3.  L3-4: Disc height and hydration are maintained. There is mild bilateral facet hypertrophy. No spinal stenosis or nerve root encroachment.  L4-5: Disc height and hydration are maintained. There is mild bilateral facet hypertrophy. No spinal stenosis or nerve root encroachment.  L5-S1: There is a small right paracentral disc protrusion and  mild bilateral facet hypertrophy. No spinal stenosis or nerve root encroachment.  IMPRESSION: 1. No acute or significant findings are demonstrated within the lumbar spine. There is a small right paracentral disc protrusion at L5-S1 and mild facet hypertrophy in the lower lumbar spine. 2. The spinal canal and neural foramina are widely patent at all levels. There is no nerve root encroachment. 3. Right adnexal lesion for which pelvic ultrasound is scheduled today.    EXAM: TRANSABDOMINAL AND TRANSVAGINAL ULTRASOUND OF PELVIS  TECHNIQUE: Both transabdominal and transvaginal ultrasound examinations of the pelvis were performed. Transabdominal technique was performed for global imaging of the pelvis including uterus, ovaries, adnexal regions, and pelvic cul-de-sac. It was necessary to proceed with endovaginal exam following the transabdominal exam to visualize the ovaries.  COMPARISON: 06/28/2014  FINDINGS: Uterus  Absent due to hysterectomy.  Endometrium  Absent due to hysterectomy.  Right ovary  Measurements: 4.6 by 5.2 by 3.8 cm. Abnormal complex hypo echogenicity throughout most of the structure concerning for ovarian mass. This seems somewhat infiltrative and is difficult to separate from the adjacent ovarian parenchyma, but a portion of but measures 2.8 cm on image 54.  Left ovary  Not visible sonographically.  Other findings  No free fluid.  IMPRESSION: 1. Complex right ovarian mass or cyst, as on recent CT scan. Clearly given the omental caking this is the primary site of concern and ovarian carcinoma is suspected. 2. We were unable to demonstrate the left ovary sonographically.      DISCUSSION: all of history reviewed as above, including circumstances surrounding diagnosis, surgical findings and pathology information, and recommendation for adjuvant chemotherapy. We have discussed spectrum of gyn malignancies, from borderline tumors  to low grade malignancies and more aggressive malignancies. We have discussed rationale for adjuvant chemotherapy and mentioned possibility of maintenance hormonal blocker after chemo, tho did not go into detail about Megace. We have discussed outpatient chemotherapy with taxol and carboplatin; she appears appropriate for every 3 week dosing, is aware of steroid premedication and possible taxol aches/ taxol neuropathy. She will continue to monitor blood pressure at home and to take antihypertensives as instructed. We have discussed antiemetics, follow up at this office, peripheral vs PAC administration of chemo, how to contact this office if questions or concerns at any point. She and sister are interested in genetics referral, tho timing of this can be adjusted if needed depending on chemo schedule. Sister will assist with transportation, aware that we can do forms for intermittent FMLA if needed for sister's work. Message to financial counselor re possible assistance funds for [facility].  All questions answered. Patient is comfortable with discussion and in agreement with chemotherapy as outlined. Verbal consent obtained.     IMPRESSION / PLAN:  1. IIIC low grade serous primary peritoneal carcinoma: post optimal debulking by Dr Skeet Latch 07-28-14, now to begin adjuvant carboplatin taxol x 6 cycles, then consideration of maintenance megace. WIll begin treatment 08-25-14, q 3 week schedule. I will see her back with labs ~ 1 week after  first treatment, will add gCSF depending on counts.  2. Hypertensive urgency at presentation, BP somewhat higher today than she has had at home, followed by PCP with [facility] 3. Post partial thyroidectomy  4.overdue mammograms, which we will address when possible. Request sent to support staff for mammogram scholarship 5.family history cancers also, genetics referral already placed 6.low back pain: does not seem related to the cancer diagnosis,  some degenerative change seen on MRI L spine. May need orthopedics to see, however is some better so will follow for now. 7.social concerns: Medicaid pending.  8.Mild mediastinal and hilar adenopathy of uncertain significance, seen on CT 06-28-14. Will follow with repeat scans after chemotherapy.      Patient and sister have had questions answered to their satisfaction and are in agreement with plan above. They can contact this office for questions or concerns at any time prior to next scheduled visit. Chemo orders placed. Message to financial staff re chemo, possible granix and requesting assistance thru [place] if available.  Time spent  50 min, including >50% discussion and coordination of care.    Eira Alpert P, MD 08/19/2014 12:14 PM

## 2014-08-19 NOTE — Telephone Encounter (Signed)
per pof ot sch pt appt-gave pt copy of sch-sent MW emailt o sch trmt-pt aware

## 2014-08-19 NOTE — Progress Notes (Signed)
Checked in new pt with no insurance.  Pt has applied for Medicaid and is awaiting approval.  She has my card to keep me updated with the status of Medicaid.  If she is denied she will apply for a discount thru the hospital.

## 2014-08-20 LAB — CA 125: CA 125: 27 U/mL (ref <35)

## 2014-08-21 ENCOUNTER — Other Ambulatory Visit: Payer: Self-pay | Admitting: Oncology

## 2014-08-21 DIAGNOSIS — C482 Malignant neoplasm of peritoneum, unspecified: Secondary | ICD-10-CM | POA: Insufficient documentation

## 2014-08-22 ENCOUNTER — Encounter: Payer: Self-pay | Admitting: Oncology

## 2014-08-22 ENCOUNTER — Telehealth: Payer: Self-pay | Admitting: Oncology

## 2014-08-22 ENCOUNTER — Other Ambulatory Visit: Payer: Self-pay

## 2014-08-22 ENCOUNTER — Telehealth: Payer: Self-pay | Admitting: *Deleted

## 2014-08-22 NOTE — Telephone Encounter (Signed)
pt called to r/s lab done....pt ok and aware °

## 2014-08-22 NOTE — Progress Notes (Signed)
Talked to pt about the [facility] and [place].  Pt will provide bank statement on 08/25/14 to see if she will qualify for the grants.

## 2014-08-22 NOTE — Telephone Encounter (Signed)
Patient called and left message regarding her appts. Patient had appt scheduled in error for today. I have canceled the appt and called the patient.

## 2014-08-23 ENCOUNTER — Telehealth: Payer: Self-pay | Admitting: *Deleted

## 2014-08-23 ENCOUNTER — Other Ambulatory Visit: Payer: Self-pay | Admitting: *Deleted

## 2014-08-23 DIAGNOSIS — C569 Malignant neoplasm of unspecified ovary: Secondary | ICD-10-CM

## 2014-08-23 MED ORDER — LORAZEPAM 1 MG PO TABS: ORAL_TABLET | ORAL | Status: DC

## 2014-08-23 MED ORDER — DEXAMETHASONE 4 MG PO TABS: ORAL_TABLET | ORAL | Status: DC

## 2014-08-23 MED ORDER — ONDANSETRON HCL 8 MG PO TABS: ORAL_TABLET | ORAL | Status: DC

## 2014-08-23 NOTE — Telephone Encounter (Signed)
Decadron and zofran tablets sent to [place]. Decadron #30 is $4 and zofran #30 is $10. Ativan tablets sent to [place] for $9 as it cannot be filled at [facility].   Called patient and notified her of this. Patient agreeable to get her sister to pick up decadron tablets this afternoon. She says she has zofran 4mg  tablets at home and that [name] told her it would be fine to take 2 tablets to total 8mg  as needed. Told patient that is fine if she does not want to get the zofran sent today - patient agreeable to let the pharmacy know if she wants them now or not and they can keep it on file for when she needs it.  Reviewed decadron instructions prior to first chemo on 3-17. Patient wrote down instructions and is agreeable to take 5 tablets with food at 10:30pm and 4:30am prior to chemo. She states her bowels are moving fine now - denies any constipation. Reminded patient that she can take OTC claritin for taxol aches and see if it helps - patient voiced understanding. Told patient if she has any questions prior to chemo on 3-17 to please call our office - patient agreeable to this.

## 2014-08-23 NOTE — Telephone Encounter (Signed)
-----   Message from Gordy Levan, MD sent at 08/21/2014  2:57 PM EDT ----- First q 3 week taxol Norma Fredrickson 08-25-14.  Needs scripts, either to [place] if she qualifies for assistance, or thru [facility] and [employer]. I have messaged financial staff re ? [facility] assistance.  Please send to pharmacy  [employer] always fine  1.zofran 8 mg:  1 q 8 hr prn nausea. Will not make drowsy  #30  1RF  2.ativan 1 mg:  1 SL or po q 6 hr prn nausea. Will make drowsy  #20 NRF  3.decadron 4 mg:  Five tabs with food (=20mg ) 12 hrs prior to chemo and 5 tabs with    food (=20 mg) 6 hrs prior to chemo  #10 for first treatment.  RN please call patient to review times/ instructions for meds prior to 3-17 first treatment. Ask if bowels ok, remind that claritin can be tried for taxol aches.  thanks

## 2014-08-24 ENCOUNTER — Other Ambulatory Visit: Payer: Self-pay

## 2014-08-25 ENCOUNTER — Other Ambulatory Visit: Payer: Self-pay

## 2014-08-25 ENCOUNTER — Ambulatory Visit (HOSPITAL_BASED_OUTPATIENT_CLINIC_OR_DEPARTMENT_OTHER): Payer: Medicaid Other | Admitting: Nurse Practitioner

## 2014-08-25 ENCOUNTER — Ambulatory Visit (HOSPITAL_BASED_OUTPATIENT_CLINIC_OR_DEPARTMENT_OTHER): Payer: Medicaid Other

## 2014-08-25 DIAGNOSIS — M79604 Pain in right leg: Secondary | ICD-10-CM | POA: Diagnosis not present

## 2014-08-25 DIAGNOSIS — M79605 Pain in left leg: Secondary | ICD-10-CM

## 2014-08-25 DIAGNOSIS — Z5111 Encounter for antineoplastic chemotherapy: Secondary | ICD-10-CM

## 2014-08-25 DIAGNOSIS — C786 Secondary malignant neoplasm of retroperitoneum and peritoneum: Secondary | ICD-10-CM | POA: Diagnosis not present

## 2014-08-25 DIAGNOSIS — C482 Malignant neoplasm of peritoneum, unspecified: Secondary | ICD-10-CM

## 2014-08-25 DIAGNOSIS — M79606 Pain in leg, unspecified: Secondary | ICD-10-CM

## 2014-08-25 MED ORDER — SODIUM CHLORIDE 0.9 % IV SOLN
Freq: Once | INTRAVENOUS | Status: AC
  Administered 2014-08-25: 10:00:00 via INTRAVENOUS

## 2014-08-25 MED ORDER — CARBOPLATIN CHEMO INJECTION 600 MG/60ML
720.0000 mg | Freq: Once | INTRAVENOUS | Status: AC
  Administered 2014-08-25: 720 mg via INTRAVENOUS
  Filled 2014-08-25: qty 72

## 2014-08-25 MED ORDER — FAMOTIDINE IN NACL 20-0.9 MG/50ML-% IV SOLN
INTRAVENOUS | Status: AC
  Filled 2014-08-25: qty 50

## 2014-08-25 MED ORDER — DIPHENHYDRAMINE HCL 50 MG/ML IJ SOLN
INTRAMUSCULAR | Status: AC
  Filled 2014-08-25: qty 1

## 2014-08-25 MED ORDER — FAMOTIDINE IN NACL 20-0.9 MG/50ML-% IV SOLN
20.0000 mg | Freq: Once | INTRAVENOUS | Status: AC
  Administered 2014-08-25: 20 mg via INTRAVENOUS

## 2014-08-25 MED ORDER — PACLITAXEL CHEMO INJECTION 300 MG/50ML
175.0000 mg/m2 | Freq: Once | INTRAVENOUS | Status: AC
  Administered 2014-08-25: 366 mg via INTRAVENOUS
  Filled 2014-08-25: qty 61

## 2014-08-25 MED ORDER — DIPHENHYDRAMINE HCL 50 MG/ML IJ SOLN
50.0000 mg | Freq: Once | INTRAMUSCULAR | Status: AC
  Administered 2014-08-25: 50 mg via INTRAVENOUS

## 2014-08-25 MED ORDER — SODIUM CHLORIDE 0.9 % IV SOLN
Freq: Once | INTRAVENOUS | Status: AC
  Administered 2014-08-25: 10:00:00 via INTRAVENOUS
  Filled 2014-08-25: qty 8

## 2014-08-25 NOTE — Patient Instructions (Signed)
[facility] Discharge Instructions for Patients Receiving Chemotherapy  Today you received the following chemotherapy agents Taxol and Carboplatin  To help prevent nausea and vomiting after your treatment, we encourage you to take your nausea medication     If you develop nausea and vomiting that is not controlled by your nausea medication, call the clinic.   BELOW ARE SYMPTOMS THAT SHOULD BE REPORTED IMMEDIATELY:  *FEVER GREATER THAN 100.5 F  *CHILLS WITH OR WITHOUT FEVER  NAUSEA AND VOMITING THAT IS NOT CONTROLLED WITH YOUR NAUSEA MEDICATION  *UNUSUAL SHORTNESS OF BREATH  *UNUSUAL BRUISING OR BLEEDING  TENDERNESS IN MOUTH AND THROAT WITH OR WITHOUT PRESENCE OF ULCERS  *URINARY PROBLEMS  *BOWEL PROBLEMS  UNUSUAL RASH Items with * indicate a potential emergency and should be followed up as soon as possible.  Feel free to call the clinic you have any questions or concerns. The clinic phone number is [phone].  Please show the CHEMO ALERT CARD at check-in to the Emergency Department and triage nurse.   

## 2014-08-26 ENCOUNTER — Encounter: Payer: Self-pay | Admitting: Nurse Practitioner

## 2014-08-26 ENCOUNTER — Telehealth: Payer: Self-pay

## 2014-08-26 DIAGNOSIS — M79606 Pain in leg, unspecified: Secondary | ICD-10-CM | POA: Insufficient documentation

## 2014-08-26 NOTE — Telephone Encounter (Signed)
-----   Message from Graylon Gunning, RN sent at 08/25/2014  1:46 PM EDT ----- Regarding: chemo follow up call First time Taxol 3 hour and Carboplatin. Dr Marko Plume.  Thanks

## 2014-08-26 NOTE — Assessment & Plan Note (Signed)
Patient presented to the [facility] today to receive cycle one of her carboplatin/Taxol chemotherapy regimen.  She is status post debulking surgery on fever 18th 2016.  Patient developed some mild bilateral lower leg pain/cramping during the Taxol portion of her chemotherapy today; but the cramping completely resolved after approximate 10 minutes with no interventions.  Patient was able to complete all of her chemotherapy today with no further issues.  Patient plans to return on 09/01/2014 for labs and a follow-up visit.  She knows to call in the interim if she develops any new or worsening symptoms whatsoever.

## 2014-08-26 NOTE — Progress Notes (Signed)
SYMPTOM MANAGEMENT CLINIC   HPI: Sarah [name] 49 y.o. female diagnosed with primary peritoneal carcinomatosis.  Patient is status post debulking surgery on 07/28/2014.  Here today to initiate carboplatin/Taxol chemotherapy regimen.  Patient had received approximately 15 minutes of her initial Taxol infusion; and developed some very mild bilateral lower leg cramping/pain.  She denied any chest pain, chest pressure, shortness breath, or pain with inspiration.  Confirmed the patient did receive both Benadryl 50 mg and Pepcid 20 mg IV as premedications prior to her chemotherapy.  The Taxol infusion was held; and patient was observed closely.  Patient developed no other new symptoms whatsoever.  Patient refused any pain medication while cancer Center.  Vital signs remained stable throughout.  After approximately 10 minutes of observation-patient reported that all leg cramping has completely resolved.  Patient was able to complete all of her chemotherapy as previously planned.   HPI  ROS  Past Medical History  Diagnosis Date  . Hypertension   . Glucagonoma 07/28/14    Pt denies this but states she has glaucoma  . Shortness of breath dyspnea     hx of 2013 - no problems currently   . Glaucoma 02/16    Past Surgical History  Procedure Laterality Date  . Abdominal hysterectomy    . Thyroidectomy, partial    . Robotic assisted total hysterectomy with bilateral salpingo oopherectomy Bilateral 07/28/2014    Procedure: ROBOTIC ASSISTED lysis of adhesions with biopsies, converted to LAPAROTOMY, bilateral salpingoorphorectomy, omentectomy,appendectomy;  Surgeon: Janie Morning, MD;  Location: [facility];  Service: Gynecology;  Laterality: Bilateral;    has Hypertensive urgency; Adnexal mass; Essential hypertension; Pulmonary nodule; Hilar adenopathy; Ovarian cancer; Pelvic mass in female; Primary peritoneal carcinomatosis; and Leg pain on her problem list.    has No Known Allergies.      Medication List       This list is accurate as of: 08/25/14 11:59 PM.  Always use your most recent med list.               acetaminophen 500 MG tablet  Commonly known as:  TYLENOL  Take 1,000 mg by mouth every 6 (six) hours as needed for mild pain or headache.     amLODipine 10 MG tablet  Commonly known as:  NORVASC  Take 1 tablet (10 mg total) by mouth daily.     atorvastatin 20 MG tablet  Commonly known as:  LIPITOR  Take 1 tablet (20 mg total) by mouth daily.     dexamethasone 4 MG tablet  Commonly known as:  DECADRON  Take 5 tablets (=35m) by mouth with food 12 hours and 6 hours prior to chemo.     docusate sodium 100 MG capsule  Commonly known as:  COLACE  Take 100 mg by mouth.     enoxaparin 40 MG/0.4ML injection  Commonly known as:  LOVENOX  Inject 0.4 mLs (40 mg total) into the skin daily.     ibuprofen 800 MG tablet  Commonly known as:  ADVIL,MOTRIN  Take 1 tablet (800 mg total) by mouth every 6 (six) hours.     lisinopril-hydrochlorothiazide 10-12.5 MG per tablet  Commonly known as:  PRINZIDE,ZESTORETIC  Take 1 tablet by mouth daily.     LORazepam 1 MG tablet  Commonly known as:  ATIVAN  Place 1 tablet under tongue or swallow every 6 hours as needed for nausea. Will make drowsy.     metoprolol tartrate 25 MG tablet  Commonly known as:  LOPRESSOR  Take 0.5 tablets (12.5 mg total) by mouth 2 (two) times daily.     ondansetron 4 MG tablet  Commonly known as:  ZOFRAN  Take 1 tablet (4 mg total) by mouth every 6 (six) hours as needed for nausea.     ondansetron 8 MG tablet  Commonly known as:  ZOFRAN  Take 1 tablet by mouth every 8 hours as needed for nausea. Will not make drowsy.     Oxycodone HCl 10 MG Tabs  Take 1 tablet (10 mg total) by mouth every 4 (four) hours as needed for breakthrough pain.     polyethylene glycol packet  Commonly known as:  MIRALAX / GLYCOLAX  Take 17 g by mouth daily as needed.         PHYSICAL  EXAMINATION  Oncology Vitals 08/25/2014 08/25/2014 08/25/2014 08/25/2014 08/25/2014 08/25/2014 08/25/2014  Height - - - - - - -  Weight - - - - - - -  Weight (lbs) - - - - - - -  BMI (kg/m2) - - - - - - -  Temp 97.8 98.2 97.4 97.4 97.7 98.2 98.2  Pulse 68 66 68 101 68 68 72  Resp 18 18 18 18 18 18 18   SpO2 - - - - - - -  BSA (m2) - - - - - - -   BP Readings from Last 3 Encounters:  08/25/14 153/82  08/19/14 149/75  08/03/14 155/93    Physical Exam  Constitutional: She is oriented to person, place, and time and well-developed, well-nourished, and in no distress.  HENT:  Head: Normocephalic and atraumatic.  Eyes: Conjunctivae and EOM are normal. Pupils are equal, round, and reactive to light.  Neck: Normal range of motion.  Pulmonary/Chest: Effort normal. No respiratory distress.  Musculoskeletal: Normal range of motion. She exhibits no edema or tenderness.  Neurological: She is alert and oriented to person, place, and time.  Skin: Skin is warm and dry. No rash noted. No erythema.  Psychiatric: Affect normal.  Nursing note and vitals reviewed.   LABORATORY DATA:. No visits with results within 3 Day(s) from this visit. Latest known visit with results is:  Appointment on 08/19/2014  Component Date Value Ref Range Status  . WBC 08/19/2014 6.3  3.9 - 10.3 10e3/uL Final  . NEUT# 08/19/2014 3.0  1.5 - 6.5 10e3/uL Final  . HGB 08/19/2014 12.0  11.6 - 15.9 g/dL Final  . HCT 08/19/2014 37.6  34.8 - 46.6 % Final  . Platelets 08/19/2014 368  145 - 400 10e3/uL Final  . MCV 08/19/2014 88.9  79.5 - 101.0 fL Final  . MCH 08/19/2014 28.4  25.1 - 34.0 pg Final  . MCHC 08/19/2014 31.9  31.5 - 36.0 g/dL Final  . RBC 08/19/2014 4.23  3.70 - 5.45 10e6/uL Final  . RDW 08/19/2014 14.4  11.2 - 14.5 % Final  . lymph# 08/19/2014 2.1  0.9 - 3.3 10e3/uL Final  . MONO# 08/19/2014 0.9  0.1 - 0.9 10e3/uL Final  . Eosinophils Absolute 08/19/2014 0.1  0.0 - 0.5 10e3/uL Final  . Basophils Absolute  08/19/2014 0.0  0.0 - 0.1 10e3/uL Final  . NEUT% 08/19/2014 48.4  38.4 - 76.8 % Final  . LYMPH% 08/19/2014 34.2  14.0 - 49.7 % Final  . MONO% 08/19/2014 14.7* 0.0 - 14.0 % Final  . EOS% 08/19/2014 2.1  0.0 - 7.0 % Final  . BASO% 08/19/2014 0.6  0.0 - 2.0 % Final  . Sodium 08/19/2014 140  136 -  145 mEq/L Final  . Potassium 08/19/2014 4.0  3.5 - 5.1 mEq/L Final  . Chloride 08/19/2014 106  98 - 109 mEq/L Final  . CO2 08/19/2014 26  22 - 29 mEq/L Final  . Glucose 08/19/2014 112  70 - 140 mg/dl Final  . BUN 08/19/2014 21.2  7.0 - 26.0 mg/dL Final  . Creatinine 08/19/2014 1.1  0.6 - 1.1 mg/dL Final  . Total Bilirubin 08/19/2014 0.33  0.20 - 1.20 mg/dL Final  . Alkaline Phosphatase 08/19/2014 49  40 - 150 U/L Final  . AST 08/19/2014 31  5 - 34 U/L Final  . ALT 08/19/2014 38  0 - 55 U/L Final  . Total Protein 08/19/2014 8.0  6.4 - 8.3 g/dL Final  . Albumin 08/19/2014 3.7  3.5 - 5.0 g/dL Final  . Calcium 08/19/2014 9.7  8.4 - 10.4 mg/dL Final  . Anion Gap 08/19/2014 9  3 - 11 mEq/L Final  . EGFR 08/19/2014 71* >90 ml/min/1.73 m2 Final   eGFR is calculated using the CKD-EPI Creatinine Equation (2009)  . CA 125 08/19/2014 27  <35 U/mL Final   Comment:  This test was performed using the [place] chemiluminescentmethod.  Values obtained from different assay methods cannot be usedinterchangeably.  CA125 levels , regardless of value, should not beinterpreted as absolute evidence of the presence or  absence ofdisease.      RADIOGRAPHIC STUDIES: No results found.  ASSESSMENT/PLAN:    Leg pain Patient had received approximately 15 minutes of her initial Taxol chemotherapy; when she developed some mild cramping to her bilateral legs.  She denied any chest pain, chest pressure, shortness breath, or pain with inspiration.  The Taxol infusion was held.  Patient states that she typically takes oxycodone when at home for her generalized aching.  Patient refused pain medication today.  Confirmed the  patient received Benadryl 50 mg, Pepcid 20 mg, and Zofran 16 mg as premedications prior to her chemotherapy today.  After observing patient for approximately 10 minutes-all leg pain did completely resolve.  Vital signs remained stable throughout.  Patient was able to complete all of her chemotherapy as planned today with no further issues.     Primary peritoneal carcinomatosis Patient presented to the [facility] today to receive cycle one of her carboplatin/Taxol chemotherapy regimen.  She is status post debulking surgery on fever 18th 2016.  Patient developed some mild bilateral lower leg pain/cramping during the Taxol portion of her chemotherapy today; but the cramping completely resolved after approximate 10 minutes with no interventions.  Patient was able to complete all of her chemotherapy today with no further issues.  Patient plans to return on 09/01/2014 for labs and a follow-up visit.  She knows to call in the interim if she develops any new or worsening symptoms whatsoever.   Patient stated understanding of all instructions; and was in agreement with this plan of care. The patient knows to call the clinic with any problems, questions or concerns.   Review/collaboration with [name] regarding all aspects of patient's visit today.   Total time spent with patient was 15 minutes;  with greater than 75 percent of that time spent in face to face counseling regarding patient's symptoms,  and coordination of care and follow up.  Disclaimer: This note was dictated with voice recognition software. Similar sounding words can inadvertently be transcribed and may not be corrected upon review.   Drue Second, NP 08/26/2014

## 2014-08-26 NOTE — Telephone Encounter (Signed)
[name] states she is feeling great today.  She had some nausea last evening. She took a Zofran and ativan tablet with good effect.  She is drinking fluids well. Suggested that she pick up OTC Claritin 10 mg to begin today to help with the aches from the Taxol that will begin in the next couple of days.  Told her ~Sunday that she may feel like a [name] Truck hit her.  This is to be expected. She knows to call  [phone] if any issues or concerns prior to next visit. She received a Dipl[employer] Services operational officer for [employer].  She will bring the summons to her next appointment to have [name] write aletter to excuse her.

## 2014-08-26 NOTE — Assessment & Plan Note (Addendum)
Patient had received approximately 15 minutes of her initial Taxol chemotherapy; when she developed some mild cramping to her bilateral legs.  She denied any chest pain, chest pressure, shortness breath, or pain with inspiration.  The Taxol infusion was held.  Patient states that she typically takes oxycodone when at home for her generalized aching.  Patient refused pain medication today.  Confirmed the patient received Benadryl 50 mg, Pepcid 20 mg, and Zofran 16 mg as premedications prior to her chemotherapy today.  After observing patient for approximately 10 minutes-all leg pain did completely resolve.  Vital signs remained stable throughout.  Patient was able to complete all of her chemotherapy as planned today with no further issues.

## 2014-08-30 ENCOUNTER — Encounter: Payer: Self-pay | Admitting: Oncology

## 2014-08-30 NOTE — Progress Notes (Signed)
Pt is approved for the $400 [facility] and the $400 [employer] grant.

## 2014-08-31 ENCOUNTER — Other Ambulatory Visit: Payer: Self-pay | Admitting: *Deleted

## 2014-08-31 DIAGNOSIS — C569 Malignant neoplasm of unspecified ovary: Secondary | ICD-10-CM

## 2014-09-01 ENCOUNTER — Telehealth: Payer: Self-pay | Admitting: Oncology

## 2014-09-01 ENCOUNTER — Telehealth: Payer: Self-pay | Admitting: *Deleted

## 2014-09-01 ENCOUNTER — Other Ambulatory Visit (HOSPITAL_BASED_OUTPATIENT_CLINIC_OR_DEPARTMENT_OTHER): Payer: Medicaid Other

## 2014-09-01 ENCOUNTER — Ambulatory Visit (HOSPITAL_BASED_OUTPATIENT_CLINIC_OR_DEPARTMENT_OTHER): Payer: Medicaid Other

## 2014-09-01 ENCOUNTER — Other Ambulatory Visit: Payer: Self-pay | Admitting: *Deleted

## 2014-09-01 ENCOUNTER — Other Ambulatory Visit: Payer: Self-pay | Admitting: Oncology

## 2014-09-01 ENCOUNTER — Ambulatory Visit (HOSPITAL_BASED_OUTPATIENT_CLINIC_OR_DEPARTMENT_OTHER): Payer: Medicaid Other | Admitting: Oncology

## 2014-09-01 ENCOUNTER — Encounter: Payer: Self-pay | Admitting: Oncology

## 2014-09-01 VITALS — BP 138/86 | HR 86 | Temp 98.0°F | Resp 18 | Ht 65.0 in | Wt 206.5 lb

## 2014-09-01 DIAGNOSIS — M545 Low back pain: Secondary | ICD-10-CM | POA: Diagnosis not present

## 2014-09-01 DIAGNOSIS — D701 Agranulocytosis secondary to cancer chemotherapy: Secondary | ICD-10-CM

## 2014-09-01 DIAGNOSIS — T451X5A Adverse effect of antineoplastic and immunosuppressive drugs, initial encounter: Secondary | ICD-10-CM

## 2014-09-01 DIAGNOSIS — R590 Localized enlarged lymph nodes: Secondary | ICD-10-CM | POA: Diagnosis not present

## 2014-09-01 DIAGNOSIS — C482 Malignant neoplasm of peritoneum, unspecified: Secondary | ICD-10-CM

## 2014-09-01 DIAGNOSIS — D72819 Decreased white blood cell count, unspecified: Secondary | ICD-10-CM

## 2014-09-01 DIAGNOSIS — I10 Essential (primary) hypertension: Secondary | ICD-10-CM | POA: Diagnosis not present

## 2014-09-01 DIAGNOSIS — Z5189 Encounter for other specified aftercare: Secondary | ICD-10-CM | POA: Diagnosis not present

## 2014-09-01 DIAGNOSIS — C569 Malignant neoplasm of unspecified ovary: Secondary | ICD-10-CM

## 2014-09-01 LAB — CBC WITH DIFFERENTIAL/PLATELET
BASO%: 0.3 % (ref 0.0–2.0)
BASOS ABS: 0 10*3/uL (ref 0.0–0.1)
EOS%: 3.1 % (ref 0.0–7.0)
Eosinophils Absolute: 0.1 10*3/uL (ref 0.0–0.5)
HCT: 38 % (ref 34.8–46.6)
HGB: 12.9 g/dL (ref 11.6–15.9)
LYMPH%: 50 % — AB (ref 14.0–49.7)
MCH: 29.4 pg (ref 25.1–34.0)
MCHC: 33.9 g/dL (ref 31.5–36.0)
MCV: 86.6 fL (ref 79.5–101.0)
MONO#: 0.1 10*3/uL (ref 0.1–0.9)
MONO%: 3.8 % (ref 0.0–14.0)
NEUT#: 1.4 10*3/uL — ABNORMAL LOW (ref 1.5–6.5)
NEUT%: 42.8 % (ref 38.4–76.8)
Platelets: 287 10*3/uL (ref 145–400)
RBC: 4.39 10*6/uL (ref 3.70–5.45)
RDW: 13.3 % (ref 11.2–14.5)
WBC: 3.2 10*3/uL — AB (ref 3.9–10.3)
lymph#: 1.6 10*3/uL (ref 0.9–3.3)

## 2014-09-01 LAB — COMPREHENSIVE METABOLIC PANEL (CC13)
ALT: 21 U/L (ref 0–55)
AST: 23 U/L (ref 5–34)
Albumin: 3.8 g/dL (ref 3.5–5.0)
Alkaline Phosphatase: 52 U/L (ref 40–150)
Anion Gap: 11 mEq/L (ref 3–11)
BUN: 28.7 mg/dL — AB (ref 7.0–26.0)
CALCIUM: 9.5 mg/dL (ref 8.4–10.4)
CHLORIDE: 95 meq/L — AB (ref 98–109)
CO2: 30 mEq/L — ABNORMAL HIGH (ref 22–29)
CREATININE: 1.5 mg/dL — AB (ref 0.6–1.1)
EGFR: 49 mL/min/{1.73_m2} — ABNORMAL LOW (ref >90)
GLUCOSE: 192 mg/dL — AB (ref 70–140)
POTASSIUM: 3.5 meq/L (ref 3.5–5.1)
SODIUM: 136 meq/L (ref 136–145)
Total Bilirubin: 1.28 mg/dL — ABNORMAL HIGH (ref 0.20–1.20)
Total Protein: 7.8 g/dL (ref 6.4–8.3)

## 2014-09-01 MED ORDER — DIPHENHYDRAMINE HCL 25 MG PO CAPS: ORAL_CAPSULE | ORAL | Status: DC

## 2014-09-01 MED ORDER — LORATADINE 10 MG PO TABS: 10.0000 mg | ORAL_TABLET | Freq: Every day | ORAL | Status: DC

## 2014-09-01 MED ORDER — TBO-FILGRASTIM 300 MCG/0.5ML ~~LOC~~ SOSY
300.0000 ug | PREFILLED_SYRINGE | Freq: Once | SUBCUTANEOUS | Status: AC
  Administered 2014-09-01: 300 ug via SUBCUTANEOUS
  Filled 2014-09-01: qty 0.5

## 2014-09-01 NOTE — Patient Instructions (Addendum)
Tbo-Filgrastim injection What is this medicine? TBO-FILGRASTIM (T B O fil GRA stim) is a granulocyte colony-stimulating factor that stimulates the growth of neutrophils, a type of white blood cell important in the body's fight against infection. It is used to reduce the incidence of fever and infection in patients with certain types of cancer who are receiving chemotherapy that affects the bone marrow. This medicine may be used for other purposes; ask your health care provider or pharmacist if you have questions. COMMON BRAND NAME(S): Granix What should I tell my health care provider before I take this medicine? They need to know if you have any of these conditions: -ongoing radiation therapy -sickle cell anemia -an unusual or allergic reaction to tbo-filgrastim, filgrastim, pegfilgrastim, other medicines, foods, dyes, or preservatives -pregnant or trying to get pregnant -breast-feeding How should I use this medicine? This medicine is for injection under the skin. If you get this medicine at home, you will be taught how to prepare and give this medicine. Refer to the Instructions for Use that come with your medication packaging. Use exactly as directed. Take your medicine at regular intervals. Do not take your medicine more often than directed. It is important that you put your used needles and syringes in a special sharps container. Do not put them in a trash can. If you do not have a sharps container, call your pharmacist or healthcare provider to get one. Talk to your pediatrician regarding the use of this medicine in children. Special care may be needed. Overdosage: If you think you've taken too much of this medicine contact a poison control center or emergency room at once. Overdosage: If you think you have taken too much of this medicine contact a poison control center or emergency room at once. NOTE: This medicine is only for you. Do not share this medicine with others. What if I miss a  dose? It is important not to miss your dose. Call your doctor or health care professional if you miss a dose. What may interact with this medicine? This medicine may interact with the following medications: -medicines that may cause a release of neutrophils, such as lithium This list may not describe all possible interactions. Give your health care provider a list of all the medicines, herbs, non-prescription drugs, or dietary supplements you use. Also tell them if you smoke, drink alcohol, or use illegal drugs. Some items may interact with your medicine. What should I watch for while using this medicine? You may need blood work done while you are taking this medicine. What side effects may I notice from receiving this medicine? Side effects that you should report to your doctor or health care professional as soon as possible: -allergic reactions like skin rash, itching or hives, swelling of the face, lips, or tongue -shortness of breath or breathing problems -fever -pain, redness, or irritation at site where injected -pinpoint red spots on the skin -stomach or side pain, or pain at the shoulder -swelling -tiredness -trouble passing urine Side effects that usually do not require medical attention (Report these to your doctor or health care professional if they continue or are bothersome.): -bone pain -muscle pain This list may not describe all possible side effects. Call your doctor for medical advice about side effects. You may report side effects to FDA at 1-800-FDA-1088. Where should I keep my medicine? Keep out of the reach of children. Store in a refrigerator between 2 and 8 degrees C (36 and 46 degrees F). Keep in carton to   protect from light. Throw away this medicine if it is left out of the refrigerator for more than 5 consecutive days. Throw away any unused medicine after the expiration date. NOTE: This sheet is a summary. It may not cover all possible information. If you have  questions about this medicine, talk to your doctor, pharmacist, or health care provider.  2015, Elsevier/Gold Standard. (2013-09-16 11:52:29) Neutropenia Neutropenia is a condition that occurs when the level of a certain type of white blood cell (neutrophil) in your body becomes lower than normal. Neutrophils are made in the bone marrow and fight infections. These cells protect against bacteria and viruses. The fewer neutrophils you have, and the longer your body remains without them, the greater your risk of getting a severe infection becomes. CAUSES  The cause of neutropenia may be hard to determine. However, it is usually due to 3 main problems:   Decreased production of neutrophils. This may be due to:  Certain medicines such as chemotherapy.  Genetic problems.  Cancer.  Radiation treatments.  Vitamin deficiency.  Some pesticides.  Increased destruction of neutrophils. This may be due to:  Overwhelming infections.  Hemolytic anemia. This is when the body destroys its own blood cells.  Chemotherapy.  Neutrophils moving to areas of the body where they cannot fight infections. This may be due to:  Dialysis procedures.  Conditions where the spleen becomes enlarged. Neutrophils are held in the spleen and are not available to the rest of the body.  Overwhelming infections. The neutrophils are held in the area of the infection and are not available to the rest of the body. SYMPTOMS  There are no specific symptoms of neutropenia. The lack of neutrophils can result in an infection, and an infection can cause various problems. DIAGNOSIS  Diagnosis is made by a blood test. A complete blood count is performed. The normal level of neutrophils in human blood differs with age and race. Infants have lower counts than older children and adults. African Americans have lower counts than Caucasians or Asians. The average adult level is 1500 cells/mm3 of blood. Neutrophil counts are  interpreted as follows:  Greater than 1000 cells/mm3 gives normal protection against infection.  500 to 1000 cells/mm3 gives an increased risk for infection.  200 to 500 cells/mm3 is a greater risk for severe infection.  Lower than 200 cells/mm3 is a marked risk of infection. This may require hospitalization and treatment with antibiotic medicines. TREATMENT  Treatment depends on the underlying cause, severity, and presence of infections or symptoms. It also depends on your health. Your caregiver will discuss the treatment plan with you. Mild cases are often easily treated and have a good outcome. Preventative measures may also be started to limit your risk of infections. Treatment can include:  Taking antibiotics.  Stopping medicines that are known to cause neutropenia.  Correcting nutritional deficiencies by eating green vegetables to supply folic acid and taking vitamin B supplements.  Stopping exposure to pesticides if your neutropenia is related to pesticide exposure.  Taking a blood growth factor called sargramostim, pegfilgrastim, or filgrastim if you are undergoing chemotherapy for cancer. This stimulates white blood cell production.  Removal of the spleen if you have Felty's syndrome and have repeated infections. HOME CARE INSTRUCTIONS   Follow your caregiver's instructions about when you need to have blood work done.  Wash your hands often. Make sure others who come in contact with you also wash their hands.  Wash raw fruits and vegetables before eating them. They   can carry bacteria and fungi.  Avoid people with colds or spreadable (contagious) diseases (chickenpox, herpes zoster, influenza).  Avoid large crowds.  Avoid construction areas. The dust can release fungus into the air.  Be cautious around children in daycare or school environments.  Take care of your respiratory system by coughing and deep breathing.  Bathe daily.  Protect your skin from cuts and  burns.  Do not work in the garden or with flowers and plants.  Care for the mouth before and after meals by brushing with a soft toothbrush. If you have mucositis, do not use mouthwash. Mouthwash contains alcohol and can dry out the mouth even more.  Clean the area between the genitals and the anus (perineal area) after urination and bowel movements. Women need to wipe from front to back.  Use a water soluble lubricant during sexual intercourse and practice good hygiene after. Do not have intercourse if you are severely neutropenic. Check with your caregiver for guidelines.  Exercise daily as tolerated.  Avoid people who were vaccinated with a live vaccine in the past 30 days. You should not receive live vaccines (polio, typhoid).  Do not provide direct care for pets. Avoid animal droppings. Do not clean litter boxes and bird cages.  Do not share food utensils.  Do not use tampons, enemas, or rectal suppositories unless directed by your caregiver.  Use an electric razor to remove hair.  Wash your hands after handling magazines, letters, and newspapers. SEEK IMMEDIATE MEDICAL CARE IF:   You have a fever.  You have chills or start to shake.  You feel nauseous or vomit.  You develop mouth sores.  You develop aches and pains.  You have redness and swelling around open wounds.  Your skin is warm to the touch.  You have pus coming from your wounds.  You develop swollen lymph nodes.  You feel weak or fatigued.  You develop red streaks on the skin. MAKE SURE YOU:  Understand these instructions.  Will watch your condition.  Will get help right away if you are not doing well or get worse. Document Released: 11/16/2001 Document Revised: 08/19/2011 Document Reviewed: 12/14/2010 ExitCare Patient Information 2015 [employer]. This information is not intended to replace advice given to you by your health care provider. Make sure you discuss any questions you have with your  health care provider.  

## 2014-09-01 NOTE — Progress Notes (Signed)
OFFICE PROGRESS NOTE   September 01, 2014   Physicians:Emma Rossi/ W. 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 Counts are not yet at nadir she is already leukopenic with ANC 1.4, so will begin granix. May consider megace as maintenance after completion of chemotherapy.  Patient tolerated treatment itself well and felt well on day 2, then fatigued and taxol aches in legs especially days 3 and 4. She used one tylenol and heating pad for the aches; I have asked her to try claritin with next treatment. She has only intermittent, mild aches in legs now. She had nausea on days 5 and 6, used antiemetics after she vomited. She had constipation after treatment, improved with miralax and stool softeners yesterday. She has been able to drink water and ginger ale. She has eaten some. She did sleep last pm. Blood pressure was 107/77 this AM per patient (note hypertensive urgency at presentation); she denies orthostatic symptoms, will increase po fluids.   No PAC For genetics counseling 09-15-14.  Has gotten assistance for prescriptions thru [place] Patient presented to ED 06-28-14 with fairly acute onset of low back pain, without known trauma, so severe at that time that she was having difficulty standing and walking; blood pressure in ED was 260/148 such that she was admitted for hypertensive urgency. Evaluation included CT CAP 06-28-14 which demonstrated a 4.4 cm right adnexal mass, with stranding in omentum and small fluid in pelvis, as well as mild mediastinal and hilar adenopathy. US showed 4.6 x 5.2 x 3.8 cm apparent complex ovarian mass. CA 125 was 58 on 06-30-14. She was seen in consultation by Dr [name] on 07-08-14. Imaging otherwise showed no aortic aneurysm; MRI of L  spine 06-29-14 found some facet hypertrophy and small right paracentral disc protrusion L5S1. Blood pressure was treated and patient established with [facility], Dr Doreene Burke.  She was stable for surgery by Dr Skeet Latch 07-28-14, which was exploratory laparotomy with BSO, infragastric omentectomy, washings and biopsies and appendectomy. Pathology [phone]) found low grade serous carcinoma thruout. Post operative course was unremarkable. She saw Dr [name] on 08-03-14, with recommendation for 6 cycles of taxol and carboplatin then consideration of Megace maintenance; she will see Dr [name] again after chemo completes. Genetics referral was recommended, particularly with family history.    Review of systems as above, also: No fever or symptoms of infection. No bleeding. No increased SOB. No peripheral neuropathy. Not complaining of back pain now, has not needed oxycodone and does not want that refilled. Remainder of 10 point Review of Systems negative.  Objective:  Vital signs in last 24 hours:  BP 138/86 mmHg  Pulse 86  Temp(Src) 98 F (36.7 C) (Oral)  Resp 18  Ht 5' 5"  (1.651 m)  Wt 206 lb 8 oz (93.668 kg)  BMI 34.36 kg/m2  weight down 5.5 lbs Alert, oriented and appropriate. Ambulatory without assistance. Respirations not labored, not in any acute discomfort, very pleasant No alopecia  HEENT:PERRL, sclerae not icteric. Oral mucosa moist without lesions, posterior pharynx clear.  Neck supple. No JVD.  Lymphatics:no cervical,supraclavicular or inguinal adenopathy Resp: clear to auscultation bilaterally and normal percussion bilaterally Cardio: regular rate and rhythm. No gallop. GI: soft, nontender, not distended, no mass or organomegaly. Normally active bowel sounds. Surgical incision healing  well, not tender. Musculoskeletal/ Extremities: without pitting edema, cords, tenderness Neuro: no peripheral neuropathy. Otherwise nonfocal. PSYCH appropriate mood and affect Skin  without rash, ecchymosis, petechiae   Lab Results:  Results for orders placed or performed in visit on 09/01/14  CBC with Differential  Result Value Ref Range   WBC 3.2 (L) 3.9 - 10.3 10e3/uL   NEUT# 1.4 (L) 1.5 - 6.5 10e3/uL   HGB 12.9 11.6 - 15.9 g/dL   HCT 38.0 34.8 - 46.6 %   Platelets 287 145 - 400 10e3/uL   MCV 86.6 79.5 - 101.0 fL   MCH 29.4 25.1 - 34.0 pg   MCHC 33.9 31.5 - 36.0 g/dL   RBC 4.39 3.70 - 5.45 10e6/uL   RDW 13.3 11.2 - 14.5 %   lymph# 1.6 0.9 - 3.3 10e3/uL   MONO# 0.1 0.1 - 0.9 10e3/uL   Eosinophils Absolute 0.1 0.0 - 0.5 10e3/uL   Basophils Absolute 0.0 0.0 - 0.1 10e3/uL   NEUT% 42.8 38.4 - 76.8 %   LYMPH% 50.0 (H) 14.0 - 49.7 %   MONO% 3.8 0.0 - 14.0 %   EOS% 3.1 0.0 - 7.0 %   BASO% 0.3 0.0 - 2.0 %  Comprehensive metabolic panel (Cmet) - [facility]  Result Value Ref Range   Sodium 136 136 - 145 mEq/L   Potassium 3.5 3.5 - 5.1 mEq/L   Chloride 95 (L) 98 - 109 mEq/L   CO2 30 (H) 22 - 29 mEq/L   Glucose 192 (H) 70 - 140 mg/dl   BUN 28.7 (H) 7.0 - 26.0 mg/dL   Creatinine 1.5 (H) 0.6 - 1.1 mg/dL   Total Bilirubin 1.28 (H) 0.20 - 1.20 mg/dL   Alkaline Phosphatase 52 40 - 150 U/L   AST 23 5 - 34 U/L   ALT 21 0 - 55 U/L   Total Protein 7.8 6.4 - 8.3 g/dL   Albumin 3.8 3.5 - 5.0 g/dL   Calcium 9.5 8.4 - 10.4 mg/dL   Anion Gap 11 3 - 11 mEq/L   EGFR 49 (L) >90 ml/min/1.73 m2    CA 125 on 08-19-14 was 27, having been 58 on 06-30-14  Studies/Results:  No results found.  Medications: I have reviewed the patient's current medications. She will try benadryl for sleep prn and will begin claritin today as she may have aches again from granix. Use miralax or senokot S daily as needed to keep bowels moving daily. Granix today, 3-25 and 3-26.  DISCUSSION Discussed symptoms since chemo with recommendations as noted. Discussed dropping blood counts and use of gCSF in daily doses (for less aches) to lessen chance of neutropenic complications. I have encouraged her to  call if questions or concerns between scheduled visits.   Note written for jury duty.  Assessment/Plan:  1. IIIC low grade serous primary peritoneal carcinoma: post optimal debulking by Dr Skeet Latch 07-28-14, and first adjuvant carboplatin taxol 08-25-14. Add gCSF x 3 days beginning today. I will see her back on 09-08-14 with labs.  2. Hypertensive urgency at presentation, BP variable but in better range now, followed by PCP with [facility] 3. Post partial thyroidectomy  4.overdue mammograms, which we will address when possible. Request sent to support staff for mammogram scholarship 5.family history cancers also, genetics counseling scheduled 6.low back pain: does not seem related to the cancer diagnosis, some degenerative change seen on MRI L spine. May need orthopedics to see, however is some better so will follow for now. 7.social concerns:  Medicaid pending. Appreciate assistance with medications in interim. 8.Mild mediastinal and hilar adenopathy of uncertain significance, seen on CT 06-28-14. Will follow with repeat scans after chemotherapy.   All questions answered. Patient understands discussion and is in agreement with plans above. Time spent 25 min including >50% counseling and coordination of care.  LIVESAY,LENNIS P, MD   09/01/2014, 12:21 PM

## 2014-09-01 NOTE — Patient Instructions (Signed)
Please pick up Claritin 10 mg from [facility] today and start 1 daily at least thru Sunday, as this may help aching from granix shots You should also pick up benadryl (generic diphenhydramine fine) to use 25 mg at bedtime as needed for sleep. You can repeat this in 4 hours if needed.  Drink lots of fluids, more than you have been drinking. Water and gingerale are fine.  OK to use miralax and/or stool softeners daily as needed to keep bowels moving daily.  Ibuprofen or tylenol are ok to try for aching, You need to eat something when you take ibuprofen.  Call us before next visit if fever (>=100.5) or symptoms of infection, or other problems. Stay away from people who might be sick, including stores, restaurants, until we check your blood counts next.    [phone].

## 2014-09-01 NOTE — Telephone Encounter (Signed)
Call received from Ms Noreta Kue who has left pharmavy for claritin and benadryl.  Orders need to be called to [facility].

## 2014-09-01 NOTE — Telephone Encounter (Signed)
per pof to sch pt appt-gave pt copy of sch °

## 2014-09-02 ENCOUNTER — Ambulatory Visit (HOSPITAL_BASED_OUTPATIENT_CLINIC_OR_DEPARTMENT_OTHER): Payer: Medicaid Other

## 2014-09-02 DIAGNOSIS — C482 Malignant neoplasm of peritoneum, unspecified: Secondary | ICD-10-CM

## 2014-09-02 MED ORDER — TBO-FILGRASTIM 300 MCG/0.5ML ~~LOC~~ SOSY
300.0000 ug | PREFILLED_SYRINGE | Freq: Once | SUBCUTANEOUS | Status: AC
  Administered 2014-09-02: 300 ug via SUBCUTANEOUS
  Filled 2014-09-02: qty 0.5

## 2014-09-03 ENCOUNTER — Ambulatory Visit (HOSPITAL_BASED_OUTPATIENT_CLINIC_OR_DEPARTMENT_OTHER): Payer: Medicaid Other

## 2014-09-03 DIAGNOSIS — Z5189 Encounter for other specified aftercare: Secondary | ICD-10-CM | POA: Diagnosis not present

## 2014-09-03 DIAGNOSIS — C482 Malignant neoplasm of peritoneum, unspecified: Secondary | ICD-10-CM | POA: Diagnosis not present

## 2014-09-03 MED ORDER — TBO-FILGRASTIM 300 MCG/0.5ML ~~LOC~~ SOSY
300.0000 ug | PREFILLED_SYRINGE | Freq: Once | SUBCUTANEOUS | Status: AC
  Administered 2014-09-03: 300 ug via SUBCUTANEOUS

## 2014-09-05 ENCOUNTER — Other Ambulatory Visit: Payer: Self-pay | Admitting: Internal Medicine

## 2014-09-06 ENCOUNTER — Telehealth: Payer: Self-pay | Admitting: *Deleted

## 2014-09-06 NOTE — Telephone Encounter (Signed)
i have called pt twice today, this am and this evening to f/u to see how she is doing. Both times I left a detailed message to let the patient know I was calling to make sure she was doing all right  since her 3 injections last week and to call this nurse back @ [phone]. Message to be forwarded to Evlyn Clines, MD.

## 2014-09-07 ENCOUNTER — Other Ambulatory Visit: Payer: Self-pay | Admitting: Oncology

## 2014-09-08 ENCOUNTER — Ambulatory Visit (HOSPITAL_BASED_OUTPATIENT_CLINIC_OR_DEPARTMENT_OTHER): Payer: Medicaid Other | Admitting: Oncology

## 2014-09-08 ENCOUNTER — Other Ambulatory Visit: Payer: Self-pay | Admitting: Emergency Medicine

## 2014-09-08 ENCOUNTER — Telehealth: Payer: Self-pay | Admitting: *Deleted

## 2014-09-08 ENCOUNTER — Other Ambulatory Visit (HOSPITAL_BASED_OUTPATIENT_CLINIC_OR_DEPARTMENT_OTHER): Payer: Medicaid Other

## 2014-09-08 ENCOUNTER — Telehealth: Payer: Self-pay | Admitting: Internal Medicine

## 2014-09-08 ENCOUNTER — Telehealth: Payer: Self-pay | Admitting: Oncology

## 2014-09-08 ENCOUNTER — Telehealth: Payer: Self-pay

## 2014-09-08 ENCOUNTER — Encounter: Payer: Self-pay | Admitting: Oncology

## 2014-09-08 VITALS — BP 155/91 | HR 83 | Temp 98.1°F | Resp 18 | Ht 65.0 in | Wt 209.9 lb

## 2014-09-08 DIAGNOSIS — D701 Agranulocytosis secondary to cancer chemotherapy: Secondary | ICD-10-CM

## 2014-09-08 DIAGNOSIS — R590 Localized enlarged lymph nodes: Secondary | ICD-10-CM | POA: Diagnosis not present

## 2014-09-08 DIAGNOSIS — M545 Low back pain, unspecified: Secondary | ICD-10-CM

## 2014-09-08 DIAGNOSIS — C482 Malignant neoplasm of peritoneum, unspecified: Secondary | ICD-10-CM

## 2014-09-08 DIAGNOSIS — I10 Essential (primary) hypertension: Secondary | ICD-10-CM

## 2014-09-08 DIAGNOSIS — M79606 Pain in leg, unspecified: Secondary | ICD-10-CM | POA: Diagnosis not present

## 2014-09-08 DIAGNOSIS — T451X5A Adverse effect of antineoplastic and immunosuppressive drugs, initial encounter: Secondary | ICD-10-CM

## 2014-09-08 LAB — CBC WITH DIFFERENTIAL/PLATELET
BASO%: 0.2 % (ref 0.0–2.0)
Basophils Absolute: 0 10*3/uL (ref 0.0–0.1)
EOS ABS: 0.1 10*3/uL (ref 0.0–0.5)
EOS%: 0.7 % (ref 0.0–7.0)
HCT: 35.2 % (ref 34.8–46.6)
HGB: 11.7 g/dL (ref 11.6–15.9)
LYMPH%: 20.2 % (ref 14.0–49.7)
MCH: 29.3 pg (ref 25.1–34.0)
MCHC: 33.2 g/dL (ref 31.5–36.0)
MCV: 88 fL (ref 79.5–101.0)
MONO#: 1.3 10*3/uL — ABNORMAL HIGH (ref 0.1–0.9)
MONO%: 10.3 % (ref 0.0–14.0)
NEUT%: 68.6 % (ref 38.4–76.8)
NEUTROS ABS: 8.5 10*3/uL — AB (ref 1.5–6.5)
Platelets: 150 10*3/uL (ref 145–400)
RBC: 4 10*6/uL (ref 3.70–5.45)
RDW: 13.9 % (ref 11.2–14.5)
WBC: 12.4 10*3/uL — ABNORMAL HIGH (ref 3.9–10.3)
lymph#: 2.5 10*3/uL (ref 0.9–3.3)

## 2014-09-08 LAB — COMPREHENSIVE METABOLIC PANEL (CC13)
ALK PHOS: 75 U/L (ref 40–150)
ALT: 33 U/L (ref 0–55)
ANION GAP: 14 meq/L — AB (ref 3–11)
AST: 23 U/L (ref 5–34)
Albumin: 3.5 g/dL (ref 3.5–5.0)
BILIRUBIN TOTAL: 0.44 mg/dL (ref 0.20–1.20)
BUN: 13.1 mg/dL (ref 7.0–26.0)
CO2: 26 mEq/L (ref 22–29)
CREATININE: 1 mg/dL (ref 0.6–1.1)
Calcium: 9.2 mg/dL (ref 8.4–10.4)
Chloride: 100 mEq/L (ref 98–109)
EGFR: 77 mL/min/{1.73_m2} — ABNORMAL LOW (ref >90)
Glucose: 204 mg/dl — ABNORMAL HIGH (ref 70–140)
Potassium: 3.5 mEq/L (ref 3.5–5.1)
Sodium: 141 mEq/L (ref 136–145)
TOTAL PROTEIN: 7.5 g/dL (ref 6.4–8.3)

## 2014-09-08 MED ORDER — AMLODIPINE BESYLATE 10 MG PO TABS: 10.0000 mg | ORAL_TABLET | Freq: Every day | ORAL | Status: DC

## 2014-09-08 NOTE — Telephone Encounter (Signed)
Pt came into clinic requesting medication refill on amLODipine (NORVASC) 10 MG tablet. Pt is completely out of medication. Please f/u with pt

## 2014-09-08 NOTE — Telephone Encounter (Signed)
Per staff message and POF I have scheduled appts. Advised scheduler of appts. JMW  

## 2014-09-08 NOTE — Telephone Encounter (Signed)
per pof to sch pt appt-gave pt copy of sch-sch appt w/[facility]-pt does not have ins-adv per [facility] she may have to pay out of pocket

## 2014-09-08 NOTE — Progress Notes (Signed)
OFFICE PROGRESS NOTE   September 08, 2014   Physicians:Emma Rossi/ W. Kathlynn Grate, MD (PCP [facility])  INTERVAL HISTORY:  Patient is seen, alone for visit, in continuing attention to adjuvant chemotherapy for IIIC low grade serous primary peritoneal carcinoma, having had first carboplatin taxol on 08-25-14 and granix x 3 days beginning 09-01-14 as she was leukopenic but not at nadir for counts then.   Claritin was helpful for granix aches, which have resolved now. She has had no nausea and taste is improving now. She is fatigued with exertion, but is trying to walk in house and outdoor for 5-10 min at a time. Bowels are moving better with miralax and stool softeners. She denies peripheral neuropathy. Chronic low back pain, which was presenting problem but not clearly associated with the gyn cancer, is still bothersome, particularly with standing and leaning forward as with washing dishes. Brief but severe LE pain during first chemo infusion in retrospect may have been related to back symptoms and positioning in recliner during treatment, as this did not appear to be obvious taxol reaction or to be restless legs with benadryl.  She is beginning to lose hair, which has been upsetting to her. She will go to wig boutique today.     No PAC For genetics counseling 09-15-14.  Has gotten assistance for prescriptions thru [facility] Patient presented to ED 06-28-14 with fairly acute onset of low back pain, without known trauma, so severe at that time that she was having difficulty standing and walking; blood pressure in ED was 260/148 such that she was admitted for hypertensive urgency. Evaluation included CT CAP 06-28-14 which demonstrated a 4.4 cm right adnexal mass, with stranding in omentum and small fluid in pelvis, as well as mild mediastinal and hilar adenopathy. US showed 4.6 x 5.2 x 3.8 cm apparent complex ovarian mass. CA 125 was  58 on 06-30-14. She was seen in consultation by Dr [name] on 07-08-14. Imaging otherwise showed no aortic aneurysm; MRI of L spine 06-29-14 found some facet hypertrophy and small right paracentral disc protrusion L5S1. Blood pressure was treated and patient established with [facility], Dr Doreene Burke.  She was stable for surgery by Dr Skeet Latch 07-28-14, which was exploratory laparotomy with BSO, infragastric omentectomy, washings and biopsies and appendectomy. Pathology [phone]) found low grade serous carcinoma thruout. Post operative course was unremarkable. She saw Dr [name] on 08-03-14, with recommendation for 6 cycles of taxol and carboplatin then consideration of Megace maintenance; she will see Dr [name] again after chemo completes. Genetics counseling scheduled for 09-15-14.    Review of systems as above, also: No fever or symptoms of infection. No SOB at rest. No bladder symptoms. No LE swelling or tenderness now. Remainder of 10 point Review of Systems negative.  Objective:  Vital signs in last 24 hours:  BP 155/91 mmHg  Pulse 83  Temp(Src) 98.1 F (36.7 C) (Oral)  Resp 18  Ht 5\' 5"  (1.651 m)  Wt 209 lb 14.4 oz (95.21 kg)  BMI 34.93 kg/m2 Weight up 3 lbs Alert, oriented and appropriate. Ambulatory without difficulty.   HEENT:PERRL, sclerae not icteric. Oral mucosa moist without lesions, posterior pharynx clear.  Neck supple. No JVD.  Lymphatics:no cervical,supraclavicular, axillary or inguinal adenopathy Resp: clear to auscultation bilaterally and normal percussion bilaterally Cardio: regular rate and rhythm. No gallop. GI: soft, nontender, not distended, no mass or organomegaly. Normally active bowel sounds. Surgical incision not remarkable.  Musculoskeletal/ Extremities: LE without pitting edema, cords, tenderness. Not tender to palpation LS. Neuro: no peripheral neuropathy. Otherwise nonfocal Skin without rash, ecchymosis, petechiae   Lab Results:  Results  for orders placed or performed in visit on 09/08/14  CBC with Differential  Result Value Ref Range   WBC 12.4 (H) 3.9 - 10.3 10e3/uL   NEUT# 8.5 (H) 1.5 - 6.5 10e3/uL   HGB 11.7 11.6 - 15.9 g/dL   HCT 35.2 34.8 - 46.6 %   Platelets 150 145 - 400 10e3/uL   MCV 88.0 79.5 - 101.0 fL   MCH 29.3 25.1 - 34.0 pg   MCHC 33.2 31.5 - 36.0 g/dL   RBC 4.00 3.70 - 5.45 10e6/uL   RDW 13.9 11.2 - 14.5 %   lymph# 2.5 0.9 - 3.3 10e3/uL   MONO# 1.3 (H) 0.1 - 0.9 10e3/uL   Eosinophils Absolute 0.1 0.0 - 0.5 10e3/uL   Basophils Absolute 0.0 0.0 - 0.1 10e3/uL   NEUT% 68.6 38.4 - 76.8 %   LYMPH% 20.2 14.0 - 49.7 %   MONO% 10.3 0.0 - 14.0 %   EOS% 0.7 0.0 - 7.0 %   BASO% 0.2 0.0 - 2.0 %    CMET available after visit with nonfasting blood sugar 204, otherwise ok  Studies/Results:  No results found.  Medications: I have reviewed the patient's current medications. She will continue full oral decadron premedication for chemotherapy.  DISCUSSION: blood counts and chemo side effects discussed. Encouraged her to continue to gradually increase activity. Will make referral to Dr Nelva Bush if possible, as etiology of back pain not clear but seems to be musculoskeletal, and is very bothersome to her still. Discussed careful positioning in recliner during chemotherapy; I will also decrease benadryl, however I am not sure that benadryl was the problem during first treatment.    Assessment/Plan:   1. IIIC low grade serous primary peritoneal carcinoma: post optimal debulking by Dr Skeet Latch 07-28-14, and first adjuvant carboplatin taxol 08-25-14, with gCSF days 01-16-09.  She will have cycle 2 on 09-15-14 as long as ANC >=1.5 and plt >=100k by labs that day. I will see her back and begin granix on 09-22-14.  2. Hypertensive urgency at presentation, BP variable but in better range now, followed by PCP with [facility] 3. Post partial thyroidectomy  4.overdue mammograms, which we will address when possible. I  have not heard back from support staff re mammogram scholarship 5.family history cancers also, genetics counseling scheduled 6.low back pain: does not seem related to the cancer diagnosis, some degenerative change seen on MRI L spine. Referral to be made to Dr Nelva Bush if possible 7.social concerns: Medicaid pending. Appreciate assistance with medications in interim. 8.Mild mediastinal and hilar adenopathy of uncertain significance, seen on CT 06-28-14. Will follow with repeat scans after chemotherapy. 9.leg pain during first taxol infusion: not clearly taxol reaction and not clearly benadryl related. May have been positioning in recliner with back problems. Notes added to chemo for nursing to be aware.   All questions answered and patient is in agreement with plans above. She knows to call prior to next scheduled appointments if needed. Chemo and granix orders confirmed. Time spent 25 min including >50% counseling and coordination of care.   Mikelle Myrick P, MD   09/08/2014, 11:30 AM

## 2014-09-08 NOTE — Telephone Encounter (Signed)
Left a message in VM that she had appointment this am and to call to R/S and or let us know if she is doing ok.

## 2014-09-15 ENCOUNTER — Ambulatory Visit (HOSPITAL_BASED_OUTPATIENT_CLINIC_OR_DEPARTMENT_OTHER): Payer: Medicaid Other

## 2014-09-15 ENCOUNTER — Encounter: Payer: Self-pay | Admitting: Medical Oncology

## 2014-09-15 ENCOUNTER — Encounter: Payer: Self-pay | Admitting: Genetic Counselor

## 2014-09-15 ENCOUNTER — Other Ambulatory Visit (HOSPITAL_BASED_OUTPATIENT_CLINIC_OR_DEPARTMENT_OTHER): Payer: Medicaid Other

## 2014-09-15 ENCOUNTER — Other Ambulatory Visit: Payer: Self-pay | Admitting: *Deleted

## 2014-09-15 ENCOUNTER — Ambulatory Visit (HOSPITAL_BASED_OUTPATIENT_CLINIC_OR_DEPARTMENT_OTHER): Payer: Medicaid Other | Admitting: Genetic Counselor

## 2014-09-15 DIAGNOSIS — Z5111 Encounter for antineoplastic chemotherapy: Secondary | ICD-10-CM

## 2014-09-15 DIAGNOSIS — C482 Malignant neoplasm of peritoneum, unspecified: Secondary | ICD-10-CM

## 2014-09-15 DIAGNOSIS — Z315 Encounter for genetic counseling: Secondary | ICD-10-CM | POA: Diagnosis present

## 2014-09-15 DIAGNOSIS — Z800 Family history of malignant neoplasm of digestive organs: Secondary | ICD-10-CM | POA: Insufficient documentation

## 2014-09-15 DIAGNOSIS — C569 Malignant neoplasm of unspecified ovary: Secondary | ICD-10-CM

## 2014-09-15 LAB — CBC WITH DIFFERENTIAL/PLATELET
BASO%: 0 % (ref 0.0–2.0)
BASOS ABS: 0 10*3/uL (ref 0.0–0.1)
EOS%: 0 % (ref 0.0–7.0)
Eosinophils Absolute: 0 10*3/uL (ref 0.0–0.5)
HEMATOCRIT: 34.3 % — AB (ref 34.8–46.6)
HEMOGLOBIN: 11.8 g/dL (ref 11.6–15.9)
LYMPH%: 12.6 % — AB (ref 14.0–49.7)
MCH: 29.4 pg (ref 25.1–34.0)
MCHC: 34.4 g/dL (ref 31.5–36.0)
MCV: 85.5 fL (ref 79.5–101.0)
MONO#: 0.1 10*3/uL (ref 0.1–0.9)
MONO%: 1.2 % (ref 0.0–14.0)
NEUT%: 86.2 % — AB (ref 38.4–76.8)
NEUTROS ABS: 6.4 10*3/uL (ref 1.5–6.5)
Platelets: 287 10*3/uL (ref 145–400)
RBC: 4.01 10*6/uL (ref 3.70–5.45)
RDW: 13.6 % (ref 11.2–14.5)
WBC: 7.4 10*3/uL (ref 3.9–10.3)
lymph#: 0.9 10*3/uL (ref 0.9–3.3)

## 2014-09-15 LAB — COMPREHENSIVE METABOLIC PANEL (CC13)
ALT: 29 U/L (ref 0–55)
ANION GAP: 16 meq/L — AB (ref 3–11)
AST: 18 U/L (ref 5–34)
Albumin: 3.7 g/dL (ref 3.5–5.0)
Alkaline Phosphatase: 75 U/L (ref 40–150)
BUN: 16.7 mg/dL (ref 7.0–26.0)
CALCIUM: 10.3 mg/dL (ref 8.4–10.4)
CO2: 23 mEq/L (ref 22–29)
Chloride: 100 mEq/L (ref 98–109)
Creatinine: 0.9 mg/dL (ref 0.6–1.1)
EGFR: >90 mL/min/{1.73_m2} (ref >90)
GLUCOSE: 187 mg/dL — AB (ref 70–140)
Potassium: 3.5 mEq/L (ref 3.5–5.1)
Sodium: 139 mEq/L (ref 136–145)
Total Bilirubin: 0.69 mg/dL (ref 0.20–1.20)
Total Protein: 8.8 g/dL — ABNORMAL HIGH (ref 6.4–8.3)

## 2014-09-15 MED ORDER — SODIUM CHLORIDE 0.9 % IV SOLN
750.0000 mg | Freq: Once | INTRAVENOUS | Status: AC
  Administered 2014-09-15: 750 mg via INTRAVENOUS
  Filled 2014-09-15: qty 75

## 2014-09-15 MED ORDER — DIPHENHYDRAMINE HCL 50 MG/ML IJ SOLN
25.0000 mg | Freq: Once | INTRAMUSCULAR | Status: AC
  Administered 2014-09-15: 25 mg via INTRAVENOUS

## 2014-09-15 MED ORDER — FAMOTIDINE IN NACL 20-0.9 MG/50ML-% IV SOLN
INTRAVENOUS | Status: AC
  Filled 2014-09-15: qty 50

## 2014-09-15 MED ORDER — DIPHENHYDRAMINE HCL 50 MG/ML IJ SOLN
INTRAMUSCULAR | Status: AC
  Filled 2014-09-15: qty 1

## 2014-09-15 MED ORDER — HEPARIN SOD (PORK) LOCK FLUSH 100 UNIT/ML IV SOLN
500.0000 [IU] | Freq: Once | INTRAVENOUS | Status: DC | PRN
Start: 2014-09-15 — End: 2014-09-15
  Filled 2014-09-15: qty 5

## 2014-09-15 MED ORDER — SODIUM CHLORIDE 0.9 % IJ SOLN
10.0000 mL | INTRAMUSCULAR | Status: DC | PRN
  Filled 2014-09-15: qty 10

## 2014-09-15 MED ORDER — SODIUM CHLORIDE 0.9 % IV SOLN
Freq: Once | INTRAVENOUS | Status: AC
  Administered 2014-09-15: 11:00:00 via INTRAVENOUS

## 2014-09-15 MED ORDER — PACLITAXEL CHEMO INJECTION 300 MG/50ML
175.0000 mg/m2 | Freq: Once | INTRAVENOUS | Status: AC
  Administered 2014-09-15: 366 mg via INTRAVENOUS
  Filled 2014-09-15: qty 61

## 2014-09-15 MED ORDER — FAMOTIDINE IN NACL 20-0.9 MG/50ML-% IV SOLN
20.0000 mg | Freq: Once | INTRAVENOUS | Status: AC
  Administered 2014-09-15: 20 mg via INTRAVENOUS

## 2014-09-15 MED ORDER — SODIUM CHLORIDE 0.9 % IV SOLN
Freq: Once | INTRAVENOUS | Status: AC
  Administered 2014-09-15: 11:00:00 via INTRAVENOUS
  Filled 2014-09-15: qty 8

## 2014-09-15 NOTE — Progress Notes (Signed)
Patient Name: Sarah [name] Patient Age: 49 y.o. Encounter Date: 09/15/2014  Referring Physician: Everitt Amber, MD  Primary Care Provider: Angelica Chessman, MD   Sarah [name], a 49 y.o. female, is being seen at the [facility] due to a personal history of cancer. She presents to clinic today to discuss the possibility of a hereditary predisposition to cancer and discuss whether genetic testing is warranted.  HISTORY OF PRESENT ILLNESS: [name] was diagnosed with primary peritoneal carcinoma (low grade serous) at the age of 43. She is s/p surgery and is currently receiving chemotherapy.   She reported a partial hysterectomy about 10 years ago. She reports that her last mammogram was about 2 years ago.  Past Medical History  Diagnosis Date  . Hypertension   . Glucagonoma 07/28/14    Pt denies this but states she has glaucoma  . Shortness of breath dyspnea     hx of 2013 - no problems currently   . Glaucoma 02/16  . Peritoneal carcinomatosis   . Family history of colon cancer     Past Surgical History  Procedure Laterality Date  . Abdominal hysterectomy    . Thyroidectomy, partial    . Robotic assisted total hysterectomy with bilateral salpingo oopherectomy Bilateral 07/28/2014    Procedure: ROBOTIC ASSISTED lysis of adhesions with biopsies, converted to LAPAROTOMY, bilateral salpingoorphorectomy, omentectomy,appendectomy;  Surgeon: Janie Morning, MD;  Location: [facility] ORS;  Service: Gynecology;  Laterality: Bilateral;    History   Social History  . Marital Status: Married    Spouse Name: N/A  . Number of Children: N/A  . Years of Education: N/A   Social History Main Topics  . Smoking status: Never Smoker   . Smokeless tobacco: Never Used  . Alcohol Use: No  . Drug Use: No  . Sexual Activity: Not on file   Other Topics Concern  . Not on file   Social History Narrative     FAMILY HISTORY:   During the visit, a 4-generation  pedigree was obtained. Family tree will be sent for scanning and will be in EPIC under the Media tab.  Significant diagnoses include the following:  Family History  Problem Relation Age of Onset  . Hypertension Mother   . Hypertension Father   . Diabetes Father   . Cancer Sister 50    fibrosarcoma (back); currently 85  . Prostate cancer Maternal Uncle   . Colon cancer Paternal Aunt     Dx 6s; deceased 46s  . Prostate cancer Paternal Uncle     currently 65  . Cancer Paternal Uncle 36    "bone" ; unk. primary  . Stomach cancer Paternal Uncle     Additionally, has two sons (age 69 and 50). In addition to her sister noted above, she has two brothers (ages 16 and 78). Her mother is 7 and cancer-free. Her mother had no sisters and 4 brothers. Her father is 8 and cancer-free. Other than his siblings noted above, he had two other sisters and another brother who were cancer-free. There are no cancers in any of her 4 grandparents.  Ms. Boltz ancestry is African American. There is no known Jewish ancestry and no consanguinity.  ASSESSMENT AND PLAN: Sarah [name] is a 49 y.o. female with a personal history of primary peritoneal carcinoma and family history of various cancers as noted above. This history is not highly suggestive of a hereditary predisposition to cancer, but genetic testing is recommended due to her own diagnosis. We reviewed  the characteristics, features and inheritance patterns of hereditary cancer syndromes. We also discussed the process of testing, insurance coverage and implications of results. A negative result will be reassuring for her and her family.  Ms. Gondek wished to pursue genetic testing and a blood sample will be sent to [facility] for analysis of the 24 genes on the [employer] gene panel (ATM, BARD1, BRCA1, BRCA2, BRIP1, CDH1, CHEK2, EPCAM, MLH1, MRE11A, MSH2, MSH6, MUTYH, NBN, NF1, PALB2, PMS2, PTEN, RAD50, RAD51C, RAD51D, SMARCA4, STK11,  and TP53). We discussed the implications of a positive, negative and/ or Variant of Uncertain Significance (VUS) result. Results should be available in approximately 3-4 weeks, at which point we will contact her and address implications for her as well as address genetic testing for at-risk family members, if needed.    We encouraged Ms. Oliver-Rafuse to remain in contact with Cancer Genetics annually so that we can update the family history and inform her of any changes in cancer genetics and testing that may be of benefit for this family. Ms.  Winger questions were answered to her satisfaction today.   Thank you for the referral and allowing [place] to share in the care of your patient.   The patient was seen for a total of 30 minutes, greater than 50% of which was spent face-to-face counseling. This patient was discussed with the overseeing provider who agrees with the above.   [name], MS, [place] Certified Genetic Counselor phone: [phone] [email or web address]

## 2014-09-15 NOTE — Patient Instructions (Signed)
[place] Discharge Instructions for Patients Receiving Chemotherapy  Today you received the following chemotherapy agents taxol. carboplatin  To help prevent nausea and vomiting after your treatment, we encourage you to take your nausea medication  AS PRESCRIBED. If you develop nausea and vomiting that is not controlled by your nausea medication, call the clinic.   BELOW ARE SYMPTOMS THAT SHOULD BE REPORTED IMMEDIATELY:  *FEVER GREATER THAN 100.5 F  *CHILLS WITH OR WITHOUT FEVER  NAUSEA AND VOMITING THAT IS NOT CONTROLLED WITH YOUR NAUSEA MEDICATION  *UNUSUAL SHORTNESS OF BREATH  *UNUSUAL BRUISING OR BLEEDING  TENDERNESS IN MOUTH AND THROAT WITH OR WITHOUT PRESENCE OF ULCERS  *URINARY PROBLEMS  *BOWEL PROBLEMS  UNUSUAL RASH Items with * indicate a potential emergency and should be followed up as soon as possible.  Feel free to call the clinic you have any questions or concerns. The clinic phone number is [phone].  Please show the [place] at check-in to the Emergency Department and triage nurse.

## 2014-09-15 NOTE — Telephone Encounter (Signed)
error 

## 2014-09-21 ENCOUNTER — Other Ambulatory Visit: Payer: Self-pay

## 2014-09-21 DIAGNOSIS — C569 Malignant neoplasm of unspecified ovary: Secondary | ICD-10-CM

## 2014-09-22 ENCOUNTER — Encounter: Payer: Self-pay | Admitting: Oncology

## 2014-09-22 ENCOUNTER — Other Ambulatory Visit: Payer: Self-pay | Admitting: Oncology

## 2014-09-22 ENCOUNTER — Other Ambulatory Visit (HOSPITAL_BASED_OUTPATIENT_CLINIC_OR_DEPARTMENT_OTHER): Payer: Medicaid Other

## 2014-09-22 ENCOUNTER — Ambulatory Visit (HOSPITAL_BASED_OUTPATIENT_CLINIC_OR_DEPARTMENT_OTHER): Payer: Medicaid Other | Admitting: Oncology

## 2014-09-22 ENCOUNTER — Ambulatory Visit (HOSPITAL_BASED_OUTPATIENT_CLINIC_OR_DEPARTMENT_OTHER): Payer: Medicaid Other

## 2014-09-22 VITALS — BP 129/80 | HR 79 | Temp 97.9°F | Resp 18 | Ht 65.0 in | Wt 203.9 lb

## 2014-09-22 DIAGNOSIS — C482 Malignant neoplasm of peritoneum, unspecified: Secondary | ICD-10-CM

## 2014-09-22 DIAGNOSIS — C569 Malignant neoplasm of unspecified ovary: Secondary | ICD-10-CM

## 2014-09-22 DIAGNOSIS — G622 Polyneuropathy due to other toxic agents: Secondary | ICD-10-CM

## 2014-09-22 DIAGNOSIS — M545 Low back pain, unspecified: Secondary | ICD-10-CM

## 2014-09-22 DIAGNOSIS — T451X5A Adverse effect of antineoplastic and immunosuppressive drugs, initial encounter: Secondary | ICD-10-CM

## 2014-09-22 DIAGNOSIS — R112 Nausea with vomiting, unspecified: Secondary | ICD-10-CM

## 2014-09-22 DIAGNOSIS — Z5189 Encounter for other specified aftercare: Secondary | ICD-10-CM

## 2014-09-22 DIAGNOSIS — R5383 Other fatigue: Secondary | ICD-10-CM | POA: Diagnosis not present

## 2014-09-22 DIAGNOSIS — G620 Drug-induced polyneuropathy: Secondary | ICD-10-CM

## 2014-09-22 DIAGNOSIS — K5904 Chronic idiopathic constipation: Secondary | ICD-10-CM

## 2014-09-22 DIAGNOSIS — I10 Essential (primary) hypertension: Secondary | ICD-10-CM

## 2014-09-22 DIAGNOSIS — D701 Agranulocytosis secondary to cancer chemotherapy: Secondary | ICD-10-CM

## 2014-09-22 LAB — CBC WITH DIFFERENTIAL/PLATELET
BASO%: 0.8 % (ref 0.0–2.0)
BASOS ABS: 0 10*3/uL (ref 0.0–0.1)
EOS%: 1.4 % (ref 0.0–7.0)
Eosinophils Absolute: 0 10*3/uL (ref 0.0–0.5)
HEMATOCRIT: 34.6 % — AB (ref 34.8–46.6)
HEMOGLOBIN: 11.5 g/dL — AB (ref 11.6–15.9)
LYMPH#: 2.1 10*3/uL (ref 0.9–3.3)
LYMPH%: 59 % — AB (ref 14.0–49.7)
MCH: 28.4 pg (ref 25.1–34.0)
MCHC: 33.2 g/dL (ref 31.5–36.0)
MCV: 85.6 fL (ref 79.5–101.0)
MONO#: 0.2 10*3/uL (ref 0.1–0.9)
MONO%: 5.2 % (ref 0.0–14.0)
NEUT#: 1.2 10*3/uL — ABNORMAL LOW (ref 1.5–6.5)
NEUT%: 33.6 % — ABNORMAL LOW (ref 38.4–76.8)
Platelets: 333 10*3/uL (ref 145–400)
RBC: 4.04 10*6/uL (ref 3.70–5.45)
RDW: 14.5 % (ref 11.2–14.5)
WBC: 3.5 10*3/uL — ABNORMAL LOW (ref 3.9–10.3)

## 2014-09-22 LAB — COMPREHENSIVE METABOLIC PANEL (CC13)
ALBUMIN: 3.8 g/dL (ref 3.5–5.0)
ALK PHOS: 60 U/L (ref 40–150)
ALT: 26 U/L (ref 0–55)
AST: 22 U/L (ref 5–34)
Anion Gap: 11 mEq/L (ref 3–11)
BILIRUBIN TOTAL: 0.96 mg/dL (ref 0.20–1.20)
BUN: 17.6 mg/dL (ref 7.0–26.0)
CO2: 30 meq/L — AB (ref 22–29)
CREATININE: 1 mg/dL (ref 0.6–1.1)
Calcium: 9.4 mg/dL (ref 8.4–10.4)
Chloride: 95 mEq/L — ABNORMAL LOW (ref 98–109)
EGFR: 74 mL/min/{1.73_m2} — ABNORMAL LOW (ref >90)
Glucose: 108 mg/dl (ref 70–140)
Potassium: 3.9 mEq/L (ref 3.5–5.1)
SODIUM: 137 meq/L (ref 136–145)
TOTAL PROTEIN: 8 g/dL (ref 6.4–8.3)

## 2014-09-22 MED ORDER — POLYETHYLENE GLYCOL 3350 17 G PO PACK: 17.0000 g | PACK | Freq: Two times a day (BID) | ORAL | Status: DC | PRN

## 2014-09-22 MED ORDER — TBO-FILGRASTIM 300 MCG/0.5ML ~~LOC~~ SOSY
300.0000 ug | PREFILLED_SYRINGE | Freq: Once | SUBCUTANEOUS | Status: AC
  Administered 2014-09-22: 300 ug via SUBCUTANEOUS
  Filled 2014-09-22: qty 0.5

## 2014-09-22 NOTE — Progress Notes (Signed)
OFFICE PROGRESS NOTE   September 22, 2014   Physicians:Emma Rossi/ W. 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   She has had a rather difficult time again this cycle, tho she did not let us know about problems prior to today's visit. She had nausea with at least 4 episodes of vomiting days 2 and 3, tho she says that antiemetics were helpful and that she was able to keep down fluids. She has not had bowel movement in days, using only stool softeners. She had severe aches in lower legs beginning night of chemo, despite Claritin; heating pad was helpful and aches have been intermittent and less intense since then. She is fatigued. She has slight numbness tips of thumbs and first fingers and in toes.   No PAC Genetics testing sent with OvaNext panel on 09-15-14, pending   ONCOLOGIC HISTORY Patient presented to ED 06-28-14 with fairly acute onset of low back pain, without known trauma, so severe at that time that she was having difficulty standing and walking; blood pressure in ED was 260/148 such that she was admitted for hypertensive urgency. Evaluation included CT CAP 06-28-14 which demonstrated a 4.4 cm right adnexal mass, with stranding in omentum and small fluid in pelvis, as well as mild mediastinal and hilar adenopathy. US showed 4.6 x 5.2 x 3.8 cm apparent complex ovarian mass. CA 125 was 58 on 06-30-14. She was seen in consultation by Dr [name] on 07-08-14. Imaging otherwise showed no aortic aneurysm; MRI of L spine 06-29-14 found some facet hypertrophy and small right paracentral disc protrusion L5S1. Blood pressure was treated and patient established with [facility], Dr Doreene Burke.  She was stable for surgery by  Dr Skeet Latch 07-28-14, which was exploratory laparotomy with BSO, infragastric omentectomy, washings and biopsies and appendectomy. Pathology [id number]) found low grade serous carcinoma thruout. Post operative course was unremarkable. She saw Dr [name] on 08-03-14, with recommendation for 6 cycles of taxol and carboplatin then consideration of Megace maintenance; she will see Dr [name] again after chemo completes.    Review of systems as above, also: No fever or symptoms of infection. She is drinking fluids today. No SOB, no bleeding, no LE swelling, bladder ok. Low back pain no worse. Peripheral IV access not difficult Remainder of 10 point Review of Systems negative.  Objective:  Vital signs in last 24 hours:  BP 129/80 mmHg  Pulse 79  Temp(Src) 97.9 F (36.6 C) (Oral)  Resp 18  Ht _0  (1.651 m)  Wt 203 lb 14.4 oz (92.488 kg)  BMI 33.93 kg/m2 Weight down 6 lbs. Alert, oriented and appropriate. Ambulatory without assistance. Looks tired but not acutely ill Alopecia  HEENT:PERRL, sclerae not icteric. Oral mucosa moist without lesions, posterior pharynx clear.  Neck supple. No JVD.  Lymphatics:no cervical,supraclavicular or inguinal adenopathy Resp: clear to auscultation bilaterally and normal percussion bilaterally Cardio: regular rate and rhythm. No gallop. GI: soft, nontender, not distended, no mass or organomegaly. A few bowel sounds. Surgical incision closed, not tender. Musculoskeletal/ Extremities: without pitting edema, cords, tenderness Neuro: no significant peripheral neuropathy. Otherwise nonfocal. PSYCH appropriate mood and affect Skin without rash, ecchymosis, petechiae   Lab Results:  Results for orders placed or performed in visit on  09/22/14  CBC with Differential  Result Value Ref Range   WBC 3.5 (L) 3.9 - 10.3 10e3/uL   NEUT# 1.2 (L) 1.5 - 6.5 10e3/uL   HGB 11.5 (L) 11.6 - 15.9 g/dL   HCT 34.6 (L) 34.8 - 46.6 %   Platelets 333 145 - 400 10e3/uL   MCV 85.6  79.5 - 101.0 fL   MCH 28.4 25.1 - 34.0 pg   MCHC 33.2 31.5 - 36.0 g/dL   RBC 4.04 3.70 - 5.45 10e6/uL   RDW 14.5 11.2 - 14.5 %   lymph# 2.1 0.9 - 3.3 10e3/uL   MONO# 0.2 0.1 - 0.9 10e3/uL   Eosinophils Absolute 0.0 0.0 - 0.5 10e3/uL   Basophils Absolute 0.0 0.0 - 0.1 10e3/uL   NEUT% 33.6 (L) 38.4 - 76.8 %   LYMPH% 59.0 (H) 14.0 - 49.7 %   MONO% 5.2 0.0 - 14.0 %   EOS% 1.4 0.0 - 7.0 %   BASO% 0.8 0.0 - 2.0 %  Comprehensive metabolic panel (Cmet) - [facility]  Result Value Ref Range   Sodium 137 136 - 145 mEq/L   Potassium 3.9 3.5 - 5.1 mEq/L   Chloride 95 (L) 98 - 109 mEq/L   CO2 30 (H) 22 - 29 mEq/L   Glucose 108 70 - 140 mg/dl   BUN 17.6 7.0 - 26.0 mg/dL   Creatinine 1.0 0.6 - 1.1 mg/dL   Total Bilirubin 0.96 0.20 - 1.20 mg/dL   Alkaline Phosphatase 60 40 - 150 U/L   AST 22 5 - 34 U/L   ALT 26 0 - 55 U/L   Total Protein 8.0 6.4 - 8.3 g/dL   Albumin 3.8 3.5 - 5.0 g/dL   Calcium 9.4 8.4 - 10.4 mg/dL   Anion Gap 11 3 - 11 mEq/L   EGFR 74 (L) >90 ml/min/1.73 m2     Studies/Results:  No results found.  Medications: I have reviewed the patient's current medications. She will add miralax twice today then 1-2 doses daily to keep bowels moving daily. She will continue claritin for aches, as she begins 3 days of granix today. Note taxol was at 175 mg/m2 cycles 1 and 2 I will ask managed care if able to add EMEND beginning cycle 3; I did not discuss this with patient today.  DISCUSSION: discussed constipation and meds as above, which she will get from [place]. RN will check on her by phone tomorrow to be sure bowels moving. She is encouraged to call if problems or concerns between visits. She needs to push po fluids with weight down as noted.  Assessment/Plan: 1. 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 I will see her back with labs on 4-25 prior to cycle 3 on  10-06-14. 2. Hypertensive urgency at presentation, BP variable but in better range now, followed by PCP with [facility] 3. Post partial thyroidectomy  4.overdue mammograms, which we will address when possible. I have not heard back from support staff re mammogram scholarship 5.family history cancers also, genetics testing in process 6.low back pain: does not seem related to the cancer diagnosis, some degenerative change seen on MRI L spine. Referral requested to Dr Nelva Bush at last visit 7.social concerns: Medicaid pending. Appreciate assistance with medications in interim. 8.Mild mediastinal and hilar adenopathy of uncertain significance, seen on CT 06-28-14. Will follow with repeat scans after chemotherapy 9.minimal chemo peripheral neuropathy:  follow   All questions answered. Patient understands instructions and is in agreement with plans as above. RN to follow up by phone prior to next MD visit. Message to managed care. Time spent 25 min including >50% counseling and coordination of care.  Gordy Levan, MD   09/22/2014, 2:39 PM

## 2014-09-22 NOTE — Patient Instructions (Signed)
Miralax (glycolax) powder:  One capful when you get home today, mix in juice is fine, then one capful tonight and one capful in AM. Then use the miralax 1-2 times daily to keep bowels moving daily. If bowels don't move well by midday tomorrow, we will tell you the next thing to add.  FIne to use stool softener once or twice daily with the miralax.

## 2014-09-23 ENCOUNTER — Ambulatory Visit (HOSPITAL_BASED_OUTPATIENT_CLINIC_OR_DEPARTMENT_OTHER): Payer: Medicaid Other

## 2014-09-23 VITALS — BP 124/67 | HR 75 | Temp 98.2°F

## 2014-09-23 DIAGNOSIS — C482 Malignant neoplasm of peritoneum, unspecified: Secondary | ICD-10-CM

## 2014-09-23 DIAGNOSIS — Z5189 Encounter for other specified aftercare: Secondary | ICD-10-CM | POA: Diagnosis present

## 2014-09-23 LAB — CA 125: CA 125: 16 U/mL (ref <35)

## 2014-09-23 MED ORDER — TBO-FILGRASTIM 300 MCG/0.5ML ~~LOC~~ SOSY
300.0000 ug | PREFILLED_SYRINGE | Freq: Once | SUBCUTANEOUS | Status: AC
  Administered 2014-09-23: 300 ug via SUBCUTANEOUS
  Filled 2014-09-23: qty 0.5

## 2014-09-23 NOTE — Progress Notes (Addendum)
Spoke with Sarah [name] and she has had 1 good BM after taking the Miralax as directed by [name].

## 2014-09-24 ENCOUNTER — Ambulatory Visit (HOSPITAL_BASED_OUTPATIENT_CLINIC_OR_DEPARTMENT_OTHER): Payer: Medicaid Other

## 2014-09-24 DIAGNOSIS — M545 Low back pain, unspecified: Secondary | ICD-10-CM | POA: Insufficient documentation

## 2014-09-24 DIAGNOSIS — T451X5A Adverse effect of antineoplastic and immunosuppressive drugs, initial encounter: Secondary | ICD-10-CM

## 2014-09-24 DIAGNOSIS — C482 Malignant neoplasm of peritoneum, unspecified: Secondary | ICD-10-CM | POA: Diagnosis present

## 2014-09-24 DIAGNOSIS — R112 Nausea with vomiting, unspecified: Secondary | ICD-10-CM | POA: Insufficient documentation

## 2014-09-24 DIAGNOSIS — Z5189 Encounter for other specified aftercare: Secondary | ICD-10-CM | POA: Diagnosis not present

## 2014-09-24 DIAGNOSIS — G620 Drug-induced polyneuropathy: Secondary | ICD-10-CM | POA: Insufficient documentation

## 2014-09-24 DIAGNOSIS — D701 Agranulocytosis secondary to cancer chemotherapy: Secondary | ICD-10-CM | POA: Insufficient documentation

## 2014-09-24 DIAGNOSIS — K5904 Chronic idiopathic constipation: Secondary | ICD-10-CM | POA: Insufficient documentation

## 2014-09-24 MED ORDER — TBO-FILGRASTIM 300 MCG/0.5ML ~~LOC~~ SOSY
300.0000 ug | PREFILLED_SYRINGE | Freq: Once | SUBCUTANEOUS | Status: AC
  Administered 2014-09-24: 300 ug via SUBCUTANEOUS

## 2014-09-24 NOTE — Patient Instructions (Signed)

## 2014-09-26 ENCOUNTER — Telehealth: Payer: Self-pay | Admitting: Oncology

## 2014-09-26 ENCOUNTER — Other Ambulatory Visit: Payer: Self-pay | Admitting: Oncology

## 2014-09-26 NOTE — Progress Notes (Signed)
Medical Oncology  Per financial staff, OK to add EMEND to subsequent chemo treatments. Chemo orders changed. RN to let patient know  L.Marko Plume

## 2014-09-26 NOTE — Telephone Encounter (Signed)
per pof otp sch pt appt-cld & spk to pt -pt to get updated sch on 4/25

## 2014-09-27 ENCOUNTER — Encounter: Payer: Self-pay | Admitting: Oncology

## 2014-09-27 ENCOUNTER — Telehealth: Payer: Self-pay

## 2014-09-27 DIAGNOSIS — C569 Malignant neoplasm of unspecified ovary: Secondary | ICD-10-CM

## 2014-09-27 NOTE — Progress Notes (Signed)
Left message for Ms. Carmina Miller, case worker [phone]) to check status of pt's Medicaid application.  Requested she return my call.

## 2014-09-27 NOTE — Telephone Encounter (Signed)
Spoke with Ms. Oliver-Lapenna and she stated that her bowels are moving daily with the Miralax.  She is not achy but her feet are numb.  Told her that this is from the chemo and is to be expected.  [name] monitors the numbness incase it becomes severe and affects daily activities.  She then would need to make adjustments in the chemotherapy dosing and or give her medication to help with the numbness. The cuticle area of her nail beds on her finger are purplish in color.  Told her that this discoloration can occur with the chemotherapy. She is eating and drinking ~64 oz a day.  She is feeling fatigued and is SOB with exertion or walking short distances at times.   She is agreeable to coming in at 1500 on Thursday 09-29-14 for a CBC/diff and wait for the results. Told her that [name] is going to add another nausea medication called EMEND through her IV with her next chem, that will hopefully improve nausea next few days after the chemo.    Told her that Dr. Nelva Bush' office not not accept self pay patients and that is why she has not heard from their office since referral made on 09-08-14 by [name].  Medicaid is still pending per patient. Will inform [name] of no appointment with Dr. Nelva Bush and see what she suggests.

## 2014-09-27 NOTE — Telephone Encounter (Signed)
-----   Message from Gordy Levan, MD sent at 09/24/2014  8:22 PM EDT ----- RN please call to check on her ~ 4-19, as she had had a difficult time with cycle 2 when I saw her on day 8 last week. Please be sure bowels still moving, eating and drinking, ok with aches. If very fatigued would repeat CBC as she may be neutropenic despite granix. thanks

## 2014-09-28 ENCOUNTER — Telehealth: Payer: Self-pay | Admitting: Genetic Counselor

## 2014-09-28 NOTE — Telephone Encounter (Signed)
Received call from [facility] that testing for Sarah [name] is still on hold pending documentation of her Medicaid status. Spoke with patient and informed her of this. Test cannot be released without copy of her Medicaid card. She stated she has not yet gotten it in the mail.  [name], MS, [place] Genetic Counselor Phone: [phone] [email or web address]

## 2014-09-29 ENCOUNTER — Other Ambulatory Visit (HOSPITAL_BASED_OUTPATIENT_CLINIC_OR_DEPARTMENT_OTHER): Payer: Medicaid Other

## 2014-09-29 DIAGNOSIS — C482 Malignant neoplasm of peritoneum, unspecified: Secondary | ICD-10-CM | POA: Diagnosis present

## 2014-09-29 DIAGNOSIS — C569 Malignant neoplasm of unspecified ovary: Secondary | ICD-10-CM

## 2014-09-29 LAB — CBC WITH DIFFERENTIAL/PLATELET
BASO%: 0.8 % (ref 0.0–2.0)
Basophils Absolute: 0.1 10*3/uL (ref 0.0–0.1)
EOS%: 0.2 % (ref 0.0–7.0)
Eosinophils Absolute: 0 10*3/uL (ref 0.0–0.5)
HCT: 33 % — ABNORMAL LOW (ref 34.8–46.6)
HGB: 10.7 g/dL — ABNORMAL LOW (ref 11.6–15.9)
LYMPH#: 2.9 10*3/uL (ref 0.9–3.3)
LYMPH%: 23.5 % (ref 14.0–49.7)
MCH: 28.3 pg (ref 25.1–34.0)
MCHC: 32.5 g/dL (ref 31.5–36.0)
MCV: 87.1 fL (ref 79.5–101.0)
MONO#: 1.7 10*3/uL — ABNORMAL HIGH (ref 0.1–0.9)
MONO%: 13.7 % (ref 0.0–14.0)
NEUT#: 7.6 10*3/uL — ABNORMAL HIGH (ref 1.5–6.5)
NEUT%: 61.8 % (ref 38.4–76.8)
Platelets: 159 10*3/uL (ref 145–400)
RBC: 3.79 10*6/uL (ref 3.70–5.45)
RDW: 15 % — AB (ref 11.2–14.5)
WBC: 12.3 10*3/uL — ABNORMAL HIGH (ref 3.9–10.3)

## 2014-09-29 NOTE — Telephone Encounter (Signed)
Sarah [name] states that she is feeling better today and not as SOB. Told her that her Hgb was good at 10.7  Her ANC today was 7.6.  She will keep appointment with [name] on 10-03-14 as scheduled.

## 2014-09-30 ENCOUNTER — Encounter: Payer: Self-pay | Admitting: *Deleted

## 2014-09-30 NOTE — Progress Notes (Signed)
[facility] Psychosocial Distress Screening Clinical Social Work  Clinical Social Work was referred by distress screening protocol.  The patient scored a 8 on the Psychosocial Distress Thermometer which indicates severe distress. Clinical Social Worker called to follow up and assess for distress and other psychosocial needs. The patient shared she is tolerating chemotherapy well but is experiencing fatigue.  She shared she has "good days and bad days" and relies on her large family support to "get through the bad days".  CSW encouraged patient to attend the GYN support group for additional support and connection.  The patient is signed up for the upcoming Look Good, Feel Better class.  ONCBCN DISTRESS SCREENING 09/01/2014  Screening Type Initial Screening  Distress experienced in past week (1-10) 8  Practical problem type Food  Emotional problem type Isolation/feeling alone;Boredom  Physical Problem type Pain;Nausea/vomiting;Sleep/insomnia;Loss of appetitie;Constipation/diarrhea;Other (comment)  Physician notified of physical symptoms Yes  Referral to clinical psychology (No Data)  Referral to support programs (No Data)    Clinical Social Worker follow up needed: No.  If yes, follow up plan:  CSW encouraged patient to call if she would like to talk further.  Polo Riley, MSW, LCSW, OSW-C Clinical Social Worker [facility] [phone]

## 2014-10-03 ENCOUNTER — Other Ambulatory Visit (HOSPITAL_BASED_OUTPATIENT_CLINIC_OR_DEPARTMENT_OTHER): Payer: Medicaid Other

## 2014-10-03 ENCOUNTER — Encounter: Payer: Self-pay | Admitting: Oncology

## 2014-10-03 ENCOUNTER — Ambulatory Visit (HOSPITAL_BASED_OUTPATIENT_CLINIC_OR_DEPARTMENT_OTHER): Payer: Medicaid Other | Admitting: Oncology

## 2014-10-03 VITALS — BP 140/73 | HR 82 | Temp 98.0°F | Resp 18 | Ht 65.0 in | Wt 207.7 lb

## 2014-10-03 DIAGNOSIS — K5904 Chronic idiopathic constipation: Secondary | ICD-10-CM

## 2014-10-03 DIAGNOSIS — R112 Nausea with vomiting, unspecified: Secondary | ICD-10-CM

## 2014-10-03 DIAGNOSIS — R590 Localized enlarged lymph nodes: Secondary | ICD-10-CM

## 2014-10-03 DIAGNOSIS — M545 Low back pain: Secondary | ICD-10-CM

## 2014-10-03 DIAGNOSIS — T451X5A Adverse effect of antineoplastic and immunosuppressive drugs, initial encounter: Secondary | ICD-10-CM

## 2014-10-03 DIAGNOSIS — C482 Malignant neoplasm of peritoneum, unspecified: Secondary | ICD-10-CM | POA: Diagnosis present

## 2014-10-03 DIAGNOSIS — D701 Agranulocytosis secondary to cancer chemotherapy: Secondary | ICD-10-CM

## 2014-10-03 DIAGNOSIS — G622 Polyneuropathy due to other toxic agents: Secondary | ICD-10-CM

## 2014-10-03 DIAGNOSIS — C569 Malignant neoplasm of unspecified ovary: Secondary | ICD-10-CM

## 2014-10-03 DIAGNOSIS — I10 Essential (primary) hypertension: Secondary | ICD-10-CM

## 2014-10-03 LAB — COMPREHENSIVE METABOLIC PANEL (CC13)
ALBUMIN: 3.5 g/dL (ref 3.5–5.0)
ALT: 21 U/L (ref 0–55)
AST: 22 U/L (ref 5–34)
Alkaline Phosphatase: 73 U/L (ref 40–150)
Anion Gap: 16 mEq/L — ABNORMAL HIGH (ref 3–11)
BUN: 19.9 mg/dL (ref 7.0–26.0)
CO2: 24 meq/L (ref 22–29)
CREATININE: 1.1 mg/dL (ref 0.6–1.1)
Calcium: 9.3 mg/dL (ref 8.4–10.4)
Chloride: 99 mEq/L (ref 98–109)
EGFR: 70 mL/min/{1.73_m2} — AB (ref >90)
Glucose: 148 mg/dl — ABNORMAL HIGH (ref 70–140)
POTASSIUM: 3.6 meq/L (ref 3.5–5.1)
Sodium: 140 mEq/L (ref 136–145)
Total Bilirubin: 0.55 mg/dL (ref 0.20–1.20)
Total Protein: 7.8 g/dL (ref 6.4–8.3)

## 2014-10-03 LAB — CBC WITH DIFFERENTIAL/PLATELET
BASO%: 0.1 % (ref 0.0–2.0)
Basophils Absolute: 0 10*3/uL (ref 0.0–0.1)
EOS ABS: 0 10*3/uL (ref 0.0–0.5)
EOS%: 0.1 % (ref 0.0–7.0)
HEMATOCRIT: 32.4 % — AB (ref 34.8–46.6)
HGB: 10.8 g/dL — ABNORMAL LOW (ref 11.6–15.9)
LYMPH%: 31.7 % (ref 14.0–49.7)
MCH: 29.5 pg (ref 25.1–34.0)
MCHC: 33.3 g/dL (ref 31.5–36.0)
MCV: 88.5 fL (ref 79.5–101.0)
MONO#: 0.6 10*3/uL (ref 0.1–0.9)
MONO%: 8.3 % (ref 0.0–14.0)
NEUT#: 4.3 10*3/uL (ref 1.5–6.5)
NEUT%: 59.8 % (ref 38.4–76.8)
PLATELETS: 124 10*3/uL — AB (ref 145–400)
RBC: 3.66 10*6/uL — ABNORMAL LOW (ref 3.70–5.45)
RDW: 14.7 % — ABNORMAL HIGH (ref 11.2–14.5)
WBC: 7.2 10*3/uL (ref 3.9–10.3)
lymph#: 2.3 10*3/uL (ref 0.9–3.3)

## 2014-10-03 MED ORDER — LORATADINE 10 MG PO TABS: 10.0000 mg | ORAL_TABLET | Freq: Every day | ORAL | Status: DC

## 2014-10-03 MED ORDER — DEXAMETHASONE 4 MG PO TABS: ORAL_TABLET | ORAL | Status: DC

## 2014-10-03 NOTE — Progress Notes (Signed)
OFFICE PROGRESS NOTE   October 03, 2014   Physicians:Emma Rossi/ W. 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 She had cycle 2 carbo taxol on 09-15-14, with granix x 3 beginning day 8. She is stable for cycle 3 on schedule 10-06-14.  Patient has had no further nausea or vomiting, tho this was significant for several days after each of the first 2 chemo treatments. She has been approved for EMEND, which will be added beginning cycle 3 treatment. Bowels are now moving daily with miralax once daily. PO intake has been good.Taxol aches have resolved and she knows to resume claritin starting ~ day of chemo. Minimal peripheral neuropathy unchanged. Only complaint is itching midback x a few days, without known rash.   No PAC Genetics testing sent with OvaNext panel on 09-15-14, pending   ONCOLOGIC HISTORY Patient presented to ED 06-28-14 with fairly acute onset of low back pain, without known trauma, so severe at that time that she was having difficulty standing and walking; blood pressure in ED was 260/148 such that she was admitted for hypertensive urgency. Evaluation included CT CAP 06-28-14 which demonstrated a 4.4 cm right adnexal mass, with stranding in omentum and small fluid in pelvis, as well as mild mediastinal and hilar adenopathy. US showed 4.6 x 5.2 x 3.8 cm apparent complex ovarian mass. CA 125 was 58 on 06-30-14. She was seen in consultation by Dr [name] on 07-08-14. Imaging otherwise showed no aortic aneurysm; MRI of L spine 06-29-14 found some facet hypertrophy and small right paracentral disc protrusion L5S1. Blood pressure was treated and patient established with [facility], Dr Doreene Burke.  She was stable for surgery by Dr Skeet Latch 07-28-14, which was exploratory laparotomy with BSO,  infragastric omentectomy, washings and biopsies and appendectomy. Pathology [id number]) found low grade serous carcinoma thruout. Post operative course was unremarkable. She saw Dr [name] on 08-03-14, with recommendation for 6 cycles of taxol and carboplatin then consideration of Megace maintenance; she will see Dr [name] again after chemo completes. Cycle 1 carbo taxol was 08-25-14, with leukopenia by day 8 such that granix was added x 3 doses. Delayed nausea was a problem with initial cycles, EMEND approved for cycle 3.     Review of systems as above, also: Skin dry. No fever or symptoms of infection. No SOB or cough. No bleeding. Remainder of 10 point Review of Systems negative.  Objective:  Vital signs in last 24 hours:  BP 140/73 mmHg  Pulse 82  Temp(Src) 98 F (36.7 C) (Oral)  Resp 18  Ht 5\' 5"  (1.651 m)  Wt 207 lb 11.2 oz (94.212 kg)  BMI 34.56 kg/m2 Weight up 4 lbs Alert, oriented and appropriate. Ambulatory without difficulty.  Alopecia  HEENT:PERRL, sclerae not icteric. Oral mucosa moist without lesions, posterior pharynx clear.  Neck supple. No JVD.  Lymphatics:no cervical,supraclavicular,or inguinal adenopathy Resp: clear to auscultation bilaterally and normal percussion bilaterally Cardio: regular rate and rhythm. No gallop. GI: soft, nontender, not distended, no mass or organomegaly. Normally active bowel sounds. Surgical incision not remarkable. Musculoskeletal/ Extremities: without pitting edema, cords, tenderness Neuro: no significant peripheral neuropathy. Otherwise nonfocal. PSYCH appropriate mood and affect Skin without rash, ecchymosis, petechiae. Somewhat dry across back, no other findings for puritis there.   Lab Results:  Results for orders placed or performed in visit on  10/03/14  CBC with Differential  Result Value Ref Range   WBC 7.2 3.9 - 10.3 10e3/uL   NEUT# 4.3 1.5 - 6.5 10e3/uL   HGB 10.8 (L) 11.6 - 15.9 g/dL   HCT 32.4 (L) 34.8 - 46.6 %    Platelets 124 (L) 145 - 400 10e3/uL   MCV 88.5 79.5 - 101.0 fL   MCH 29.5 25.1 - 34.0 pg   MCHC 33.3 31.5 - 36.0 g/dL   RBC 3.66 (L) 3.70 - 5.45 10e6/uL   RDW 14.7 (H) 11.2 - 14.5 %   lymph# 2.3 0.9 - 3.3 10e3/uL   MONO# 0.6 0.1 - 0.9 10e3/uL   Eosinophils Absolute 0.0 0.0 - 0.5 10e3/uL   Basophils Absolute 0.0 0.0 - 0.1 10e3/uL   NEUT% 59.8 38.4 - 76.8 %   LYMPH% 31.7 14.0 - 49.7 %   MONO% 8.3 0.0 - 14.0 %   EOS% 0.1 0.0 - 7.0 %   BASO% 0.1 0.0 - 2.0 %   CMET available after visit with glucose 148  Studies/Results:  No results found.  Medications: I have reviewed the patient's current medications. Will add EMEND beginning cycle 3 on 10-06-14 for delayed nausea vomiting.  DISCUSSION: she is aware of EMEND change and in agreement with continuing chemo as planned.  Assessment/Plan: 1. 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 I will see her 10-18-14 with labs.  2. Hypertensive urgency at presentation, BP variable but in better range now, followed by PCP with [facility] 3. Post partial thyroidectomy  4.overdue mammograms, which we will address when possible. I have not heard back from support staff re mammogram scholarship 5.family history cancers also, genetics testing in process 6.low back pain: does not seem related to the cancer diagnosis, some degenerative change seen on MRI L spine. Referral requested to Dr Nelva Bush at last visit 7.social concerns: Medicaid pending. Appreciate assistance with medications in interim. 8.Mild mediastinal and hilar adenopathy of uncertain significance, seen on CT 06-28-14. Will follow with repeat scans after chemotherapy 9.minimal chemo peripheral neuropathy: follow   Chemo and granix orders confirmed. Patient knows that she can call prior to next visit if needed. Time spent 25 min including >50% counseling and coordination of care.    Gordy Levan, MD    10/03/2014, 12:34 PM

## 2014-10-04 ENCOUNTER — Other Ambulatory Visit: Payer: Self-pay

## 2014-10-04 DIAGNOSIS — C569 Malignant neoplasm of unspecified ovary: Secondary | ICD-10-CM

## 2014-10-04 MED ORDER — LORATADINE 10 MG PO TABS
10.0000 mg | ORAL_TABLET | Freq: Every day | ORAL | Status: DC
Start: 2014-10-04 — End: 2015-01-23

## 2014-10-05 ENCOUNTER — Other Ambulatory Visit: Payer: Self-pay

## 2014-10-05 DIAGNOSIS — C569 Malignant neoplasm of unspecified ovary: Secondary | ICD-10-CM

## 2014-10-06 ENCOUNTER — Ambulatory Visit (HOSPITAL_BASED_OUTPATIENT_CLINIC_OR_DEPARTMENT_OTHER): Payer: Medicaid Other

## 2014-10-06 VITALS — BP 151/90 | HR 81 | Temp 97.3°F | Resp 20

## 2014-10-06 DIAGNOSIS — Z5111 Encounter for antineoplastic chemotherapy: Secondary | ICD-10-CM

## 2014-10-06 DIAGNOSIS — C569 Malignant neoplasm of unspecified ovary: Secondary | ICD-10-CM

## 2014-10-06 DIAGNOSIS — C482 Malignant neoplasm of peritoneum, unspecified: Secondary | ICD-10-CM

## 2014-10-06 LAB — CBC WITH DIFFERENTIAL/PLATELET
BASO%: 0.4 % (ref 0.0–2.0)
BASOS ABS: 0 10*3/uL (ref 0.0–0.1)
EOS%: 0 % (ref 0.0–7.0)
Eosinophils Absolute: 0 10*3/uL (ref 0.0–0.5)
HCT: 34.6 % — ABNORMAL LOW (ref 34.8–46.6)
HGB: 11.5 g/dL — ABNORMAL LOW (ref 11.6–15.9)
LYMPH%: 13.2 % — ABNORMAL LOW (ref 14.0–49.7)
MCH: 29.2 pg (ref 25.1–34.0)
MCHC: 33.3 g/dL (ref 31.5–36.0)
MCV: 87.8 fL (ref 79.5–101.0)
MONO#: 0.1 10*3/uL (ref 0.1–0.9)
MONO%: 0.8 % (ref 0.0–14.0)
NEUT#: 6.9 10*3/uL — ABNORMAL HIGH (ref 1.5–6.5)
NEUT%: 85.6 % — ABNORMAL HIGH (ref 38.4–76.8)
PLATELETS: 244 10*3/uL (ref 145–400)
RBC: 3.95 10*6/uL (ref 3.70–5.45)
RDW: 15.5 % — ABNORMAL HIGH (ref 11.2–14.5)
WBC: 8 10*3/uL (ref 3.9–10.3)
lymph#: 1.1 10*3/uL (ref 0.9–3.3)

## 2014-10-06 MED ORDER — PACLITAXEL CHEMO INJECTION 300 MG/50ML
175.0000 mg/m2 | Freq: Once | INTRAVENOUS | Status: AC
  Administered 2014-10-06: 366 mg via INTRAVENOUS
  Filled 2014-10-06: qty 61

## 2014-10-06 MED ORDER — ONDANSETRON HCL 40 MG/20ML IJ SOLN
Freq: Once | INTRAMUSCULAR | Status: AC
  Administered 2014-10-06: 10:00:00 via INTRAVENOUS
  Filled 2014-10-06: qty 8

## 2014-10-06 MED ORDER — SODIUM CHLORIDE 0.9 % IV SOLN
Freq: Once | INTRAVENOUS | Status: AC
  Administered 2014-10-06: 09:00:00 via INTRAVENOUS

## 2014-10-06 MED ORDER — FAMOTIDINE IN NACL 20-0.9 MG/50ML-% IV SOLN
20.0000 mg | Freq: Once | INTRAVENOUS | Status: AC
  Administered 2014-10-06: 20 mg via INTRAVENOUS

## 2014-10-06 MED ORDER — DIPHENHYDRAMINE HCL 50 MG/ML IJ SOLN
INTRAMUSCULAR | Status: AC
  Filled 2014-10-06: qty 1

## 2014-10-06 MED ORDER — SODIUM CHLORIDE 0.9 % IV SOLN
750.0000 mg | Freq: Once | INTRAVENOUS | Status: AC
  Administered 2014-10-06: 750 mg via INTRAVENOUS
  Filled 2014-10-06: qty 75

## 2014-10-06 MED ORDER — DIPHENHYDRAMINE HCL 50 MG/ML IJ SOLN
25.0000 mg | Freq: Once | INTRAMUSCULAR | Status: AC
  Administered 2014-10-06: 25 mg via INTRAVENOUS

## 2014-10-06 MED ORDER — SODIUM CHLORIDE 0.9 % IV SOLN
Freq: Once | INTRAVENOUS | Status: AC
  Administered 2014-10-06: 10:00:00 via INTRAVENOUS
  Filled 2014-10-06: qty 5

## 2014-10-06 MED ORDER — FAMOTIDINE IN NACL 20-0.9 MG/50ML-% IV SOLN
INTRAVENOUS | Status: AC
  Filled 2014-10-06: qty 50

## 2014-10-06 NOTE — Patient Instructions (Signed)
[facility] Discharge Instructions for Patients Receiving Chemotherapy  Today you received the following chemotherapy agents: Taxol, Carboplatin  To help prevent nausea and vomiting after your treatment, we encourage you to take your nausea medication as prescribed by your physician.   If you develop nausea and vomiting that is not controlled by your nausea medication, call the clinic.   BELOW ARE SYMPTOMS THAT SHOULD BE REPORTED IMMEDIATELY:  *FEVER GREATER THAN 100.5 F  *CHILLS WITH OR WITHOUT FEVER  NAUSEA AND VOMITING THAT IS NOT CONTROLLED WITH YOUR NAUSEA MEDICATION  *UNUSUAL SHORTNESS OF BREATH  *UNUSUAL BRUISING OR BLEEDING  TENDERNESS IN MOUTH AND THROAT WITH OR WITHOUT PRESENCE OF ULCERS  *URINARY PROBLEMS  *BOWEL PROBLEMS  UNUSUAL RASH Items with * indicate a potential emergency and should be followed up as soon as possible.  Feel free to call the clinic you have any questions or concerns. The clinic phone number is [phone].  Please show the CHEMO ALERT CARD at check-in to the Emergency Department and triage nurse.   

## 2014-10-10 ENCOUNTER — Other Ambulatory Visit: Payer: Self-pay | Admitting: Internal Medicine

## 2014-10-13 ENCOUNTER — Other Ambulatory Visit: Payer: Self-pay | Admitting: Oncology

## 2014-10-13 ENCOUNTER — Ambulatory Visit (HOSPITAL_BASED_OUTPATIENT_CLINIC_OR_DEPARTMENT_OTHER): Payer: Medicaid Other

## 2014-10-13 ENCOUNTER — Telehealth: Payer: Self-pay | Admitting: Oncology

## 2014-10-13 ENCOUNTER — Telehealth: Payer: Self-pay

## 2014-10-13 ENCOUNTER — Telehealth: Payer: Self-pay | Admitting: *Deleted

## 2014-10-13 VITALS — BP 138/86 | HR 87 | Temp 97.7°F

## 2014-10-13 DIAGNOSIS — C482 Malignant neoplasm of peritoneum, unspecified: Secondary | ICD-10-CM

## 2014-10-13 DIAGNOSIS — Z5189 Encounter for other specified aftercare: Secondary | ICD-10-CM

## 2014-10-13 MED ORDER — TBO-FILGRASTIM 300 MCG/0.5ML ~~LOC~~ SOSY
300.0000 ug | PREFILLED_SYRINGE | Freq: Once | SUBCUTANEOUS | Status: AC
  Administered 2014-10-13: 300 ug via SUBCUTANEOUS
  Filled 2014-10-13: qty 0.5

## 2014-10-13 NOTE — Patient Instructions (Signed)

## 2014-10-13 NOTE — Telephone Encounter (Signed)
ENCOUNTER OPENED IN ERROR

## 2014-10-13 NOTE — Telephone Encounter (Signed)
Left a message in Sarah [name]'s voice mail that a Granix injection was added to her appointments on Tuesday 10-18-14 since she can not come on 10-14-14

## 2014-10-13 NOTE — Telephone Encounter (Signed)
I have added #3 granix to my visit on 5-10 thanks

## 2014-10-13 NOTE — Telephone Encounter (Signed)
Injection added to 5/10 appointment per pof  anne

## 2014-10-13 NOTE — Telephone Encounter (Signed)
TC from patient who states she cannot come in tomorrow for Granix injection but can make Saturday's appt.  She was here today. Please advise.

## 2014-10-14 ENCOUNTER — Ambulatory Visit: Payer: Self-pay

## 2014-10-14 ENCOUNTER — Other Ambulatory Visit: Payer: Self-pay | Admitting: Medical Oncology

## 2014-10-15 ENCOUNTER — Ambulatory Visit (HOSPITAL_BASED_OUTPATIENT_CLINIC_OR_DEPARTMENT_OTHER): Payer: Medicaid Other

## 2014-10-15 VITALS — BP 143/92 | HR 76 | Temp 98.2°F | Resp 18

## 2014-10-15 DIAGNOSIS — Z5189 Encounter for other specified aftercare: Secondary | ICD-10-CM | POA: Diagnosis not present

## 2014-10-15 DIAGNOSIS — C482 Malignant neoplasm of peritoneum, unspecified: Secondary | ICD-10-CM | POA: Diagnosis not present

## 2014-10-15 MED ORDER — TBO-FILGRASTIM 300 MCG/0.5ML ~~LOC~~ SOSY
300.0000 ug | PREFILLED_SYRINGE | Freq: Once | SUBCUTANEOUS | Status: AC
  Administered 2014-10-15: 300 ug via SUBCUTANEOUS

## 2014-10-16 ENCOUNTER — Other Ambulatory Visit: Payer: Self-pay | Admitting: Oncology

## 2014-10-16 DIAGNOSIS — C482 Malignant neoplasm of peritoneum, unspecified: Secondary | ICD-10-CM

## 2014-10-18 ENCOUNTER — Encounter: Payer: Self-pay | Admitting: Oncology

## 2014-10-18 ENCOUNTER — Other Ambulatory Visit (HOSPITAL_BASED_OUTPATIENT_CLINIC_OR_DEPARTMENT_OTHER): Payer: Medicaid Other

## 2014-10-18 ENCOUNTER — Other Ambulatory Visit: Payer: Self-pay | Admitting: *Deleted

## 2014-10-18 ENCOUNTER — Ambulatory Visit (HOSPITAL_BASED_OUTPATIENT_CLINIC_OR_DEPARTMENT_OTHER): Payer: Medicaid Other | Admitting: Oncology

## 2014-10-18 ENCOUNTER — Ambulatory Visit (HOSPITAL_BASED_OUTPATIENT_CLINIC_OR_DEPARTMENT_OTHER): Payer: Medicaid Other

## 2014-10-18 ENCOUNTER — Telehealth: Payer: Self-pay | Admitting: Oncology

## 2014-10-18 VITALS — BP 158/93 | HR 82 | Temp 98.6°F | Resp 18 | Ht 65.0 in | Wt 206.0 lb

## 2014-10-18 DIAGNOSIS — T451X5A Adverse effect of antineoplastic and immunosuppressive drugs, initial encounter: Secondary | ICD-10-CM

## 2014-10-18 DIAGNOSIS — M545 Low back pain: Secondary | ICD-10-CM | POA: Diagnosis not present

## 2014-10-18 DIAGNOSIS — Z5189 Encounter for other specified aftercare: Secondary | ICD-10-CM

## 2014-10-18 DIAGNOSIS — D6481 Anemia due to antineoplastic chemotherapy: Secondary | ICD-10-CM

## 2014-10-18 DIAGNOSIS — I10 Essential (primary) hypertension: Secondary | ICD-10-CM

## 2014-10-18 DIAGNOSIS — R112 Nausea with vomiting, unspecified: Secondary | ICD-10-CM

## 2014-10-18 DIAGNOSIS — C482 Malignant neoplasm of peritoneum, unspecified: Secondary | ICD-10-CM

## 2014-10-18 DIAGNOSIS — C569 Malignant neoplasm of unspecified ovary: Secondary | ICD-10-CM

## 2014-10-18 DIAGNOSIS — D701 Agranulocytosis secondary to cancer chemotherapy: Secondary | ICD-10-CM

## 2014-10-18 DIAGNOSIS — G622 Polyneuropathy due to other toxic agents: Secondary | ICD-10-CM

## 2014-10-18 DIAGNOSIS — G620 Drug-induced polyneuropathy: Secondary | ICD-10-CM

## 2014-10-18 DIAGNOSIS — N9489 Other specified conditions associated with female genital organs and menstrual cycle: Secondary | ICD-10-CM

## 2014-10-18 LAB — CBC WITH DIFFERENTIAL/PLATELET
BASO%: 0.2 % (ref 0.0–2.0)
Basophils Absolute: 0 10*3/uL (ref 0.0–0.1)
EOS%: 0.2 % (ref 0.0–7.0)
Eosinophils Absolute: 0 10*3/uL (ref 0.0–0.5)
HEMATOCRIT: 29.1 % — AB (ref 34.8–46.6)
HGB: 9.7 g/dL — ABNORMAL LOW (ref 11.6–15.9)
LYMPH%: 34.6 % (ref 14.0–49.7)
MCH: 29.3 pg (ref 25.1–34.0)
MCHC: 33.4 g/dL (ref 31.5–36.0)
MCV: 87.7 fL (ref 79.5–101.0)
MONO#: 1 10*3/uL — AB (ref 0.1–0.9)
MONO%: 15.2 % — AB (ref 0.0–14.0)
NEUT#: 3.3 10*3/uL (ref 1.5–6.5)
NEUT%: 49.8 % (ref 38.4–76.8)
PLATELETS: 137 10*3/uL — AB (ref 145–400)
RBC: 3.32 10*6/uL — ABNORMAL LOW (ref 3.70–5.45)
RDW: 15.8 % — ABNORMAL HIGH (ref 11.2–14.5)
WBC: 6.6 10*3/uL (ref 3.9–10.3)
lymph#: 2.3 10*3/uL (ref 0.9–3.3)

## 2014-10-18 LAB — COMPREHENSIVE METABOLIC PANEL (CC13)
ALT: 24 U/L (ref 0–55)
ANION GAP: 13 meq/L — AB (ref 3–11)
AST: 22 U/L (ref 5–34)
Albumin: 3.6 g/dL (ref 3.5–5.0)
Alkaline Phosphatase: 74 U/L (ref 40–150)
BILIRUBIN TOTAL: 0.27 mg/dL (ref 0.20–1.20)
BUN: 14.1 mg/dL (ref 7.0–26.0)
CO2: 25 mEq/L (ref 22–29)
Calcium: 8.4 mg/dL (ref 8.4–10.4)
Chloride: 100 mEq/L (ref 98–109)
Creatinine: 0.9 mg/dL (ref 0.6–1.1)
EGFR: >90 mL/min/{1.73_m2} (ref >90)
Glucose: 118 mg/dl (ref 70–140)
Potassium: 3.3 mEq/L — ABNORMAL LOW (ref 3.5–5.1)
Sodium: 138 mEq/L (ref 136–145)
Total Protein: 7.5 g/dL (ref 6.4–8.3)

## 2014-10-18 MED ORDER — TBO-FILGRASTIM 300 MCG/0.5ML ~~LOC~~ SOSY
300.0000 ug | PREFILLED_SYRINGE | Freq: Once | SUBCUTANEOUS | Status: AC
  Administered 2014-10-18: 300 ug via SUBCUTANEOUS
  Filled 2014-10-18: qty 0.5

## 2014-10-18 MED ORDER — FERROUS FUMARATE 325 (106 FE) MG PO TABS: 1.0000 | ORAL_TABLET | Freq: Every day | ORAL | Status: DC

## 2014-10-18 MED ORDER — TEMAZEPAM 15 MG PO CAPS: 15.0000 mg | ORAL_CAPSULE | Freq: Every evening | ORAL | Status: DC | PRN

## 2014-10-18 MED ORDER — OXYCODONE HCL 10 MG PO TABS: 10.0000 mg | ORAL_TABLET | Freq: Four times a day (QID) | ORAL | Status: DC | PRN

## 2014-10-18 NOTE — Telephone Encounter (Signed)
Gave patient avs report and appointments for May and June. Per 5/10 pof move 5/28 inj to 5/30 - Center closed 5/30 inj scheduled for 5/31. Message to LL.

## 2014-10-18 NOTE — Progress Notes (Signed)
OFFICE PROGRESS NOTE   Oct 18, 2014   Physicians:Emma Rossi/ W. 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 She had cycle 3 carbo taxol on 10-06-14, with EMEND for first time and with granix on 5-5 and 10-15-14. With EMEND, nausea and vomiting did not start until day 4 and she was able to keep down fluids on day 4.  Patient has been more fatigued, but not exhausted. She has had more painful neuropathy in feet bilaterally, which is bothersome especially at night; she has tingling just in tips of fingers now. Bowels are moving daily with bid miralax. Blood pressures at home are 130-140 / 80. She has had no bleeding. She is not on oral iron but is glad to begin that. She is not sleeping adequately even with benadryl.   No PAC Genetics testing sent with [employer] panel on 09-15-14, pending due to Medicaid status as of note from genetics counselor 09-28-14. CA 125 on 06-30-14   58   ONCOLOGIC HISTORY Patient presented to ED 06-28-14 with fairly acute onset of low back pain, without known trauma, so severe at that time that she was having difficulty standing and walking; blood pressure in ED was 260/148 such that she was admitted for hypertensive urgency. Evaluation included CT CAP 06-28-14 which demonstrated a 4.4 cm right adnexal mass, with stranding in omentum and small fluid in pelvis, as well as mild mediastinal and hilar adenopathy. US showed 4.6 x 5.2 x 3.8 cm apparent complex ovarian mass. CA 125 was 58 on 06-30-14. She was seen in consultation by Dr [name] on 07-08-14. Imaging otherwise showed no aortic aneurysm; MRI of L spine 06-29-14 found some facet hypertrophy and small right paracentral disc protrusion L5S1. Blood pressure was treated and patient established with [facility], Dr Doreene Burke.  She was  stable for surgery by Dr Skeet Latch 07-28-14, which was exploratory laparotomy with BSO, infragastric omentectomy, washings and biopsies and appendectomy. Pathology [phone]) found low grade serous carcinoma thruout. Post operative course was unremarkable. She saw Dr [name] on 08-03-14, with recommendation for 6 cycles of taxol and carboplatin then consideration of Megace maintenance; she will see Dr [name] again after chemo completes. Cycle 1 carbo taxol was 08-25-14, with leukopenia by day 8 such that granix was added x 3 doses. Delayed nausea was a problem with initial cycles, EMEND approved for cycle 3.       Review of systems as above, also: No fever or symptoms of infection. No SOB at rest. No other pain. Bladder ok. Remainder of 10 point Review of Systems negative.  Objective:  Vital signs in last 24 hours:  BP 158/93 mmHg  Pulse 82  Temp(Src) 98.6 F (37 C) (Oral)  Resp 18  Ht _0  (1.651 m)  Wt 206 lb (93.441 kg)  BMI 34.28 kg/m2  SpO2 100% Weight down 1 lb. Alert, oriented and appropriate. Ambulatory without difficulty.  Alopecia  HEENT:PERRL, sclerae not icteric. Oral mucosa moist without lesions, posterior pharynx clear.  Neck supple. No JVD.  Lymphatics:no cervical,supraclavicular, axillary or inguinal adenopathy Resp: clear to auscultation bilaterally and normal percussion bilaterally Cardio: regular rate and rhythm. No gallop. GI: soft, nontender, not distended, no mass or organomegaly. Normally active bowel sounds. Surgical incision not remarkable. Musculoskeletal/ Extremities: without pitting edema, cords, tenderness Neuro: peripheral neuropathy in distal feet bilaterally. Otherwise nonfocal.  PSYCH appropriate mood and affect Skin without rash, ecchymosis, petechiae   Lab Results:  Results for orders placed or performed in visit on 10/18/14  CBC with Differential  Result Value Ref Range   WBC 6.6 3.9 - 10.3 10e3/uL   NEUT# 3.3 1.5 - 6.5 10e3/uL   HGB 9.7 (L)  11.6 - 15.9 g/dL   HCT 29.1 (L) 34.8 - 46.6 %   Platelets 137 (L) 145 - 400 10e3/uL   MCV 87.7 79.5 - 101.0 fL   MCH 29.3 25.1 - 34.0 pg   MCHC 33.4 31.5 - 36.0 g/dL   RBC 3.32 (L) 3.70 - 5.45 10e6/uL   RDW 15.8 (H) 11.2 - 14.5 %   lymph# 2.3 0.9 - 3.3 10e3/uL   MONO# 1.0 (H) 0.1 - 0.9 10e3/uL   Eosinophils Absolute 0.0 0.0 - 0.5 10e3/uL   Basophils Absolute 0.0 0.0 - 0.1 10e3/uL   NEUT% 49.8 38.4 - 76.8 %   LYMPH% 34.6 14.0 - 49.7 %   MONO% 15.2 (H) 0.0 - 14.0 %   EOS% 0.2 0.0 - 7.0 %   BASO% 0.2 0.0 - 2.0 %  Comprehensive metabolic panel (Cmet) - [facility]  Result Value Ref Range   Sodium 138 136 - 145 mEq/L   Potassium 3.3 (L) 3.5 - 5.1 mEq/L   Chloride 100 98 - 109 mEq/L   CO2 25 22 - 29 mEq/L   Glucose 118 70 - 140 mg/dl   BUN 14.1 7.0 - 26.0 mg/dL   Creatinine 0.9 0.6 - 1.1 mg/dL   Total Bilirubin 0.27 0.20 - 1.20 mg/dL   Alkaline Phosphatase 74 40 - 150 U/L   AST 22 5 - 34 U/L   ALT 24 0 - 55 U/L   Total Protein 7.5 6.4 - 8.3 g/dL   Albumin 3.6 3.5 - 5.0 g/dL   Calcium 8.4 8.4 - 10.4 mg/dL   Anion Gap 13 (H) 3 - 11 mEq/L   EGFR >90 >90 ml/min/1.73 m2     Studies/Results:  No results found.  Medications: I have reviewed the patient's current medications. Add oral iron as Hemocyte or ferrous fumarate 325 mg daily on empty stomach with OJ or Vit c. Try Restoril 15 mg qhs prn sleep.  DISCUSSION: progressive chemo anemia, iron as above. Trial of Restoril. Nausea and vomiting better controlled with EMEND (previously had started night of chemo x several days) which we will continue. Discussed taxol peripheral neuropathy, recommended lots of movement and massage for feet; will adjust taxol with this significant neuropathy now. Patient is instructed to call after chemo if at any time she is not able to keep down po fluids.    Assessment/Plan:  1. 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 She may need IVF ~ day 5 even with EMEND.  2. Hypertensive urgency at presentation, BP variable but in better range now especially on home monitoring, followed by PCP with [facility] 3. Post partial thyroidectomy  4.overdue mammograms, which we will address when possible. I have not heard back from support staff re mammogram scholarship 5.family history cancers also, genetics testing in process 6.low back pain: does not seem related to the cancer diagnosis, some degenerative change seen on MRI L spine. Referral requested to Dr Nelva Bush at last visit 7.social concerns: Medicaid pending. Appreciate assistance with medications in interim. 8.Mild mediastinal  and hilar adenopathy of uncertain significance, seen on CT 06-28-14. Will follow with repeat scans after chemotherapy 9.increased peripheral neuropathy related to taxol in feet: will decrease taxol dose from 175/m2 to 135/m2 beginning cycle 4. May need to add B complex, gabapentin or elavil.  All questions answered. Chemo orders adjusted, granix entered. Time spent 25 min including >50% counseling and coordination of care.   Koriana Stepien P, MD   10/18/2014, 2:06 PM

## 2014-10-20 ENCOUNTER — Telehealth: Payer: Self-pay | Admitting: *Deleted

## 2014-10-20 ENCOUNTER — Other Ambulatory Visit: Payer: Self-pay | Admitting: Oncology

## 2014-10-20 ENCOUNTER — Telehealth: Payer: Self-pay | Admitting: Oncology

## 2014-10-20 DIAGNOSIS — C482 Malignant neoplasm of peritoneum, unspecified: Secondary | ICD-10-CM

## 2014-10-20 DIAGNOSIS — R112 Nausea with vomiting, unspecified: Secondary | ICD-10-CM

## 2014-10-20 DIAGNOSIS — T451X5A Adverse effect of antineoplastic and immunosuppressive drugs, initial encounter: Secondary | ICD-10-CM

## 2014-10-20 NOTE — Telephone Encounter (Signed)
Appointments per pof and patient will get a new schedule 5/19

## 2014-10-20 NOTE — Telephone Encounter (Signed)
Called patient with information as noted below by [name]. Patient wrote down appts for fluids and injection 5-23, and injection only on 5-24 and 5-25. Reminded patient of appts on 5-19 for next chemo. Patient agreeable to all appts.

## 2014-10-20 NOTE — Telephone Encounter (Signed)
-----   Message from Gordy Levan, MD sent at 10/20/2014 12:45 PM EDT ----- Please let her know that I would like to have IVF available ~ Mon or Tues 5-23 or 5-24 after next chemo on Thurs 5-19. I would also like to start the 3 granix shots on day of IVF, as office is closed on 5-30 and she has family reunion that weekend.  POF done for IVF and the change in granix dates. IVF orders in for 5-23 but can change date if needed.  thanks

## 2014-10-22 ENCOUNTER — Ambulatory Visit: Payer: Medicaid Other

## 2014-10-27 ENCOUNTER — Other Ambulatory Visit (HOSPITAL_BASED_OUTPATIENT_CLINIC_OR_DEPARTMENT_OTHER): Payer: Medicaid Other

## 2014-10-27 ENCOUNTER — Encounter: Payer: Self-pay | Admitting: Nurse Practitioner

## 2014-10-27 ENCOUNTER — Ambulatory Visit (HOSPITAL_BASED_OUTPATIENT_CLINIC_OR_DEPARTMENT_OTHER): Payer: Medicaid Other

## 2014-10-27 ENCOUNTER — Ambulatory Visit (HOSPITAL_BASED_OUTPATIENT_CLINIC_OR_DEPARTMENT_OTHER): Payer: Medicaid Other | Admitting: Nurse Practitioner

## 2014-10-27 VITALS — BP 151/84 | HR 83 | Temp 98.2°F | Resp 16

## 2014-10-27 DIAGNOSIS — Z5111 Encounter for antineoplastic chemotherapy: Secondary | ICD-10-CM

## 2014-10-27 DIAGNOSIS — C482 Malignant neoplasm of peritoneum, unspecified: Secondary | ICD-10-CM

## 2014-10-27 DIAGNOSIS — T7840XA Allergy, unspecified, initial encounter: Secondary | ICD-10-CM

## 2014-10-27 LAB — CBC WITH DIFFERENTIAL/PLATELET
BASO%: 0.8 % (ref 0.0–2.0)
Basophils Absolute: 0.1 10*3/uL (ref 0.0–0.1)
EOS%: 0 % (ref 0.0–7.0)
Eosinophils Absolute: 0 10*3/uL (ref 0.0–0.5)
HCT: 31.1 % — ABNORMAL LOW (ref 34.8–46.6)
HEMOGLOBIN: 10.7 g/dL — AB (ref 11.6–15.9)
LYMPH%: 14 % (ref 14.0–49.7)
MCH: 30.7 pg (ref 25.1–34.0)
MCHC: 34.3 g/dL (ref 31.5–36.0)
MCV: 89.6 fL (ref 79.5–101.0)
MONO#: 0.1 10*3/uL (ref 0.1–0.9)
MONO%: 1.2 % (ref 0.0–14.0)
NEUT#: 6.9 10*3/uL — ABNORMAL HIGH (ref 1.5–6.5)
NEUT%: 84 % — AB (ref 38.4–76.8)
Platelets: 222 10*3/uL (ref 145–400)
RBC: 3.47 10*6/uL — ABNORMAL LOW (ref 3.70–5.45)
RDW: 17.3 % — ABNORMAL HIGH (ref 11.2–14.5)
WBC: 8.3 10*3/uL (ref 3.9–10.3)
lymph#: 1.2 10*3/uL (ref 0.9–3.3)

## 2014-10-27 LAB — COMPREHENSIVE METABOLIC PANEL (CC13)
ALT: 22 U/L (ref 0–55)
AST: 19 U/L (ref 5–34)
Albumin: 3.8 g/dL (ref 3.5–5.0)
Alkaline Phosphatase: 90 U/L (ref 40–150)
Anion Gap: 18 mEq/L — ABNORMAL HIGH (ref 3–11)
BILIRUBIN TOTAL: 0.38 mg/dL (ref 0.20–1.20)
BUN: 14.2 mg/dL (ref 7.0–26.0)
CALCIUM: 10.5 mg/dL — AB (ref 8.4–10.4)
CHLORIDE: 98 meq/L (ref 98–109)
CO2: 21 mEq/L — ABNORMAL LOW (ref 22–29)
CREATININE: 1 mg/dL (ref 0.6–1.1)
EGFR: 78 mL/min/{1.73_m2} — AB (ref >90)
GLUCOSE: 231 mg/dL — AB (ref 70–140)
Potassium: 4.6 mEq/L (ref 3.5–5.1)
Sodium: 137 mEq/L (ref 136–145)
Total Protein: 8.6 g/dL — ABNORMAL HIGH (ref 6.4–8.3)

## 2014-10-27 MED ORDER — METHYLPREDNISOLONE SODIUM SUCC 125 MG IJ SOLR
125.0000 mg | Freq: Once | INTRAMUSCULAR | Status: AC | PRN
  Administered 2014-10-27: 125 mg via INTRAVENOUS

## 2014-10-27 MED ORDER — DIPHENHYDRAMINE HCL 50 MG/ML IJ SOLN
25.0000 mg | Freq: Once | INTRAMUSCULAR | Status: AC
  Administered 2014-10-27: 25 mg via INTRAVENOUS

## 2014-10-27 MED ORDER — FAMOTIDINE IN NACL 20-0.9 MG/50ML-% IV SOLN
20.0000 mg | Freq: Once | INTRAVENOUS | Status: AC
  Administered 2014-10-27: 20 mg via INTRAVENOUS

## 2014-10-27 MED ORDER — SODIUM CHLORIDE 0.9 % IV SOLN
Freq: Once | INTRAVENOUS | Status: AC
  Administered 2014-10-27: 10:00:00 via INTRAVENOUS
  Filled 2014-10-27: qty 5

## 2014-10-27 MED ORDER — SODIUM CHLORIDE 0.9 % IV SOLN
750.0000 mg | Freq: Once | INTRAVENOUS | Status: AC
  Administered 2014-10-27: 750 mg via INTRAVENOUS
  Filled 2014-10-27: qty 75

## 2014-10-27 MED ORDER — PACLITAXEL CHEMO INJECTION 300 MG/50ML
135.0000 mg/m2 | Freq: Once | INTRAVENOUS | Status: AC
  Administered 2014-10-27: 282 mg via INTRAVENOUS
  Filled 2014-10-27: qty 47

## 2014-10-27 MED ORDER — ONDANSETRON HCL 40 MG/20ML IJ SOLN
Freq: Once | INTRAMUSCULAR | Status: AC
  Administered 2014-10-27: 10:00:00 via INTRAVENOUS
  Filled 2014-10-27: qty 8

## 2014-10-27 MED ORDER — SODIUM CHLORIDE 0.9 % IV SOLN
Freq: Once | INTRAVENOUS | Status: AC
  Administered 2014-10-27: 09:00:00 via INTRAVENOUS

## 2014-10-27 MED ORDER — DIPHENHYDRAMINE HCL 50 MG/ML IJ SOLN
INTRAMUSCULAR | Status: AC
  Filled 2014-10-27: qty 1

## 2014-10-27 MED ORDER — FAMOTIDINE IN NACL 20-0.9 MG/50ML-% IV SOLN
INTRAVENOUS | Status: AC
  Filled 2014-10-27: qty 50

## 2014-10-27 NOTE — Progress Notes (Signed)
Patient reports increase neuropathy in feet.  [name] aware.  Patient reports back to baseline today.  Patient also reports hands and arms are itching.  This is not new and usually starts the Saturday or Sunday before chemotherapy.  She usually starts benadryl when this happens.  She ran out of benadryl yesterday.  Let her know that this is not prescription and she can get this OTC or just call her pharmacy for a refill if she prefers to do that. 1035 Patient c/o increased itching on her back.. Hives and whelps observed on upper part of back.  Emend/Decadron stopped and Sarah Lesser NP here to assess patient.  Order for solumedrol 125 given.  Before solumderol given, patient states her itching has stopped.   Cyndee BaconNP discussed with [name].  No further Emend/Dec today.  Go ahead with solumedrol and then proceed with Taxol/Carboplatin.Marland Kitchen

## 2014-10-27 NOTE — Patient Instructions (Signed)
[facility] Discharge Instructions for Patients Receiving Chemotherapy  Today you received the following chemotherapy agents: Taxol, Carboplatin  To help prevent nausea and vomiting after your treatment, we encourage you to take your nausea medication as prescribed by your physician.   If you develop nausea and vomiting that is not controlled by your nausea medication, call the clinic.   BELOW ARE SYMPTOMS THAT SHOULD BE REPORTED IMMEDIATELY:  *FEVER GREATER THAN 100.5 F  *CHILLS WITH OR WITHOUT FEVER  NAUSEA AND VOMITING THAT IS NOT CONTROLLED WITH YOUR NAUSEA MEDICATION  *UNUSUAL SHORTNESS OF BREATH  *UNUSUAL BRUISING OR BLEEDING  TENDERNESS IN MOUTH AND THROAT WITH OR WITHOUT PRESENCE OF ULCERS  *URINARY PROBLEMS  *BOWEL PROBLEMS  UNUSUAL RASH Items with * indicate a potential emergency and should be followed up as soon as possible.  Feel free to call the clinic you have any questions or concerns. The clinic phone number is [phone].  Please show the CHEMO ALERT CARD at check-in to the Emergency Department and triage nurse.   

## 2014-10-27 NOTE — Assessment & Plan Note (Addendum)
Pt presented today to receive cycle 4 of her carboplatin/paclitaxel chemotherapy.    Patient states that she has been developing some generalized itching to her full body 4-7 days before each chemotherapy.  Patient states she typically takes Benadryl; which controls the itching.  She denies any rash formation with these itching episodes.   Patient states that she once again developed itching over this past weekend; but ran out of Benadryl within the past 24-48 hours.  She arrived to the [place] today to receive her chemotherapy with complaint of itching.  While receiving her Emend/dexamethasone premedication-patient developed some mild rash/hives to her upper back with increased itching.  She denied any other hypersensitivity reactions whatsoever.  On exam.-Patient with very mild rash/hives to upper back only.  Patient was scratching at the hives.  No other hypersensitivity reactions noted on exam.  Emend/dexamethasone infusion was held; and confirmed that pt received benadryl, pepcid, and zofran as premedications. Pt was given solumedrol IV; and all symptoms completely resolved.   Patient was able to complete her chemotherapy today with no further difficulties whatsoever.

## 2014-10-27 NOTE — Progress Notes (Signed)
SYMPTOM MANAGEMENT CLINIC   HPI: Sarah [name] 49 y.o. female diagnosed with peritoneal carcinoma.  Currently undergoing carboplatin/paclitaxel chemotherapy regimen.  Pt presented today to receive cycle 4 of her carboplatin/paclitaxel chemotherapy.    Patient states that she has been developing some generalized itching to her full body 4-7 days before each chemotherapy.  Patient states she typically takes Benadryl; which controls the itching.  She denies any rash formation with these itching episodes.   Patient states that she once again developed itching over this past weekend; but ran out of Benadryl within the past 24-48 hours.  She arrived to the [place] today to receive her chemotherapy with complaint of itching.  While receiving her Emend/dexamethasone premedication-patient developed some mild rash/hives to her upper back with increased itching.  She denied any other hypersensitivity reactions whatsoever.  Emend/dexamethasone infusion was held; and confirmed that pt received benadryl, pepcid, and zofran as premedications. Pt was given solumedrol IV; and all symptoms completely resolved.   Patient was able to complete her chemotherapy today with no further difficulties whatsoever.   HPI  ROS  Past Medical History  Diagnosis Date  . Hypertension   . Glucagonoma 07/28/14    Pt denies this but states she has glaucoma  . Shortness of breath dyspnea     hx of 2013 - no problems currently   . Glaucoma 02/16  . Peritoneal carcinomatosis   . Family history of colon cancer     Past Surgical History  Procedure Laterality Date  . Abdominal hysterectomy    . Thyroidectomy, partial    . Robotic assisted total hysterectomy with bilateral salpingo oopherectomy Bilateral 07/28/2014    Procedure: ROBOTIC ASSISTED lysis of adhesions with biopsies, converted to LAPAROTOMY, bilateral salpingoorphorectomy, omentectomy,appendectomy;  Surgeon: Janie Morning, MD;  Location: [facility]  [facility];  Service: Gynecology;  Laterality: Bilateral;    has Hypertensive urgency; Adnexal mass; Essential hypertension; Pulmonary nodule; Hilar adenopathy; Ovarian cancer; Pelvic mass in female; Primary peritoneal carcinomatosis; Leg pain; Family history of colon cancer; Constipation - functional; Chemotherapy-induced peripheral neuropathy; Chemotherapy induced nausea and vomiting; Leukopenia due to antineoplastic chemotherapy; Midline low back pain without sciatica; and Hypersensitivity reaction on her problem list.    has No Known Allergies.    Medication List       This list is accurate as of: 10/27/14  4:43 PM.  Always use your most recent med list.               acetaminophen 500 MG tablet  Commonly known as:  TYLENOL  Take 1,000 mg by mouth every 6 (six) hours as needed for mild pain or headache.     amLODipine 10 MG tablet  Commonly known as:  NORVASC  Take 1 tablet (10 mg total) by mouth daily.     atorvastatin 20 MG tablet  Commonly known as:  LIPITOR  Take 1 tablet (20 mg total) by mouth daily.     dexamethasone 4 MG tablet  Commonly known as:  DECADRON  Take 5 tablets (=96m) by mouth with food 12 hours and 6 hours prior to chemo.     diphenhydrAMINE 25 mg capsule  Commonly known as:  BENADRYL  Take 1 every 4 to 6 hours for sleep     docusate sodium 100 MG capsule  Commonly known as:  COLACE  Take 100 mg by mouth 2 (two) times daily as needed.     ferrous fumarate 325 (106 FE) MG Tabs tablet  Commonly known as:  HEMOCYTE -  106 mg FE  Take 1 tablet (106 mg of iron total) by mouth daily. On empty stomach with orange juice     lisinopril-hydrochlorothiazide 10-12.5 MG per tablet  Commonly known as:  PRINZIDE,ZESTORETIC  Take 1 tablet by mouth daily.     loratadine 10 MG tablet  Commonly known as:  CLARITIN  Take 1 tablet (10 mg total) by mouth daily. As needed for taxol/granix aches     LORazepam 1 MG tablet  Commonly known as:  ATIVAN  Place 1 tablet under  tongue or swallow every 6 hours as needed for nausea. Will make drowsy.     metoprolol tartrate 25 MG tablet  Commonly known as:  LOPRESSOR  TAKE 1/2 TABLET BY MOUTH 2 TIMES A DAY     ondansetron 8 MG tablet  Commonly known as:  ZOFRAN  Take 1 tablet by mouth every 8 hours as needed for nausea. Will not make drowsy.     Oxycodone HCl 10 MG Tabs  Take 1 tablet (10 mg total) by mouth every 6 (six) hours as needed.     polyethylene glycol packet  Commonly known as:  MIRALAX  Take 17 g by mouth 2 (two) times daily as needed.     temazepam 15 MG capsule  Commonly known as:  RESTORIL  Take 1 capsule (15 mg total) by mouth at bedtime as needed for sleep.         PHYSICAL EXAMINATION  Oncology Vitals 10/27/2014 10/18/2014 10/15/2014 10/13/2014 10/06/2014 10/03/2014 09/23/2014  Height - 165 cm - - - 165 cm -  Weight - 93.441 kg - - - 94.212 kg -  Weight (lbs) - 206 lbs - - - 207 lbs 11 oz -  BMI (kg/m2) - 34.28 kg/m2 - - - 34.56 kg/m2 -  Temp 98.2 98.6 98.2 97.7 97.3 98 98.2  Pulse 83 82 76 87 81 82 75  Resp _0 - 20 18 -  SpO2 100 100 100 100 100 - -  BSA (m2) - 2.07 m2 - - - 2.08 m2 -   BP Readings from Last 3 Encounters:  10/27/14 151/84  10/18/14 158/93  10/15/14 143/92    Physical Exam  Constitutional: She is oriented to person, place, and time and well-developed, well-nourished, and in no distress.  HENT:  Head: Normocephalic and atraumatic.  Eyes: Conjunctivae and EOM are normal. Pupils are equal, round, and reactive to light. Right eye exhibits no discharge. Left eye exhibits no discharge. No scleral icterus.  Neck: Normal range of motion.  Pulmonary/Chest: Effort normal. No stridor. No respiratory distress.  Musculoskeletal: Normal range of motion. She exhibits no edema or tenderness.  Neurological: She is alert and oriented to person, place, and time.  Skin: Skin is warm and dry. Rash noted. No erythema. No pallor.  Mild hives only noted to upper back.  Patient is  scratching at the hives.  Psychiatric: Affect normal.  Nursing note and vitals reviewed.   LABORATORY DATA:. Appointment on 10/27/2014  Component Date Value Ref Range Status  . WBC 10/27/2014 8.3  3.9 - 10.3 10e3/uL Final  . NEUT# 10/27/2014 6.9* 1.5 - 6.5 10e3/uL Final  . HGB 10/27/2014 10.7* 11.6 - 15.9 g/dL Final  . HCT 10/27/2014 31.1* 34.8 - 46.6 % Final  . Platelets 10/27/2014 222  145 - 400 10e3/uL Final  . MCV 10/27/2014 89.6  79.5 - 101.0 fL Final  . MCH 10/27/2014 30.7  25.1 - 34.0 pg Final  . MCHC 10/27/2014 34.3  31.5 - 36.0 g/dL Final  . RBC 10/27/2014 3.47* 3.70 - 5.45 10e6/uL Final  . RDW 10/27/2014 17.3* 11.2 - 14.5 % Final  . lymph# 10/27/2014 1.2  0.9 - 3.3 10e3/uL Final  . MONO# 10/27/2014 0.1  0.1 - 0.9 10e3/uL Final  . Eosinophils Absolute 10/27/2014 0.0  0.0 - 0.5 10e3/uL Final  . Basophils Absolute 10/27/2014 0.1  0.0 - 0.1 10e3/uL Final  . NEUT% 10/27/2014 84.0* 38.4 - 76.8 % Final  . LYMPH% 10/27/2014 14.0  14.0 - 49.7 % Final  . MONO% 10/27/2014 1.2  0.0 - 14.0 % Final  . EOS% 10/27/2014 0.0  0.0 - 7.0 % Final  . BASO% 10/27/2014 0.8  0.0 - 2.0 % Final  . Sodium 10/27/2014 137  136 - 145 mEq/L Final  . Potassium 10/27/2014 4.6  3.5 - 5.1 mEq/L Final  . Chloride 10/27/2014 98  98 - 109 mEq/L Final  . CO2 10/27/2014 21* 22 - 29 mEq/L Final  . Glucose 10/27/2014 231* 70 - 140 mg/dl Final  . BUN 10/27/2014 14.2  7.0 - 26.0 mg/dL Final  . Creatinine 10/27/2014 1.0  0.6 - 1.1 mg/dL Final  . Total Bilirubin 10/27/2014 0.38  0.20 - 1.20 mg/dL Final  . Alkaline Phosphatase 10/27/2014 90  40 - 150 U/L Final  . AST 10/27/2014 19  5 - 34 U/L Final  . ALT 10/27/2014 22  0 - 55 U/L Final  . Total Protein 10/27/2014 8.6* 6.4 - 8.3 g/dL Final  . Albumin 10/27/2014 3.8  3.5 - 5.0 g/dL Final  . Calcium 10/27/2014 10.5* 8.4 - 10.4 mg/dL Final  . Anion Gap 10/27/2014 18* 3 - 11 mEq/L Final  . EGFR 10/27/2014 78* >90 ml/min/1.73 m2 Final   eGFR is calculated using the  CKD-EPI Creatinine Equation (2009)     RADIOGRAPHIC STUDIES: No results found.  ASSESSMENT/PLAN:    Primary peritoneal carcinomatosis Pt presented today to receive cycle 4 of her carboplatin/paclitaxel chemotherapy.    Patient states that she has been developing some generalized itching to her full body 4-7 days before each chemotherapy.  Patient states she typically takes Benadryl; which controls the itching.  She denies any rash formation with these itching episodes.   Patient states that she once again developed itching over this past weekend; but ran out of Benadryl within the past 24-48 hours.  She arrived to the [place] today to receive her chemotherapy with complaint of itching.  While receiving her Emend/dexamethasone premedication-patient developed some mild rash/hives to her upper back with increased itching.  She denied any other hypersensitivity reactions whatsoever.  Demand/dexamethasone infusion was held; and confirmed that pt received benadryl, pepcid, and zofran as premedications. Pt was given solumedrol IV; and all symptoms completely resolved.   Patient was able to complete her chemotherapy today with no further difficulties whatsoever.  Patient has plans to return on 10/31/2014 for IV fluid rehydration and her scheduled Neupogen injection.  Have reviewed all with [name] regarding possible hypersensitivity reaction to the Emend/dexamethasone infusion.  [name] will review other options to prevent patient's chronic nausea postchemotherapy.   Hypersensitivity reaction Pt presented today to receive cycle 4 of her carboplatin/paclitaxel chemotherapy.    Patient states that she has been developing some generalized itching to her full body 4-7 days before each chemotherapy.  Patient states she typically takes Benadryl; which controls the itching.  She denies any rash formation with these itching episodes.   Patient states that she once again developed itching  over this past weekend; but ran out of Benadryl within the past 24-48 hours.  She arrived to the [place] today to receive her chemotherapy with complaint of itching.  While receiving her Emend/dexamethasone premedication-patient developed some mild rash/hives to her upper back with increased itching.  She denied any other hypersensitivity reactions whatsoever.  On exam.-Patient with very mild rash/hives to upper back only.  Patient was scratching at the hives.  No other hypersensitivity reactions noted on exam.  Emend/dexamethasone infusion was held; and confirmed that pt received benadryl, pepcid, and zofran as premedications. Pt was given solumedrol IV; and all symptoms completely resolved.   Patient was able to complete her chemotherapy today with no further difficulties whatsoever.       Patient stated understanding of all instructions; and was in agreement with this plan of care. The patient knows to call the clinic with any problems, questions or concerns.   Review/collaboration with [name] regarding all aspects of patient's visit today.   Total time spent with patient was 25 minutes;  with greater than 75 percent of that time spent in face to face counseling regarding patient's symptoms,  and coordination of care and follow up.  Disclaimer: This note was dictated with voice recognition software. Similar sounding words can inadvertently be transcribed and may not be corrected upon review.   Drue Second, NP 10/27/2014

## 2014-10-27 NOTE — Assessment & Plan Note (Addendum)
Pt presented today to receive cycle 4 of her carboplatin/paclitaxel chemotherapy.    Patient states that she has been developing some generalized itching to her full body 4-7 days before each chemotherapy.  Patient states she typically takes Benadryl; which controls the itching.  She denies any rash formation with these itching episodes.   Patient states that she once again developed itching over this past weekend; but ran out of Benadryl within the past 24-48 hours.  She arrived to the [place] today to receive her chemotherapy with complaint of itching.  While receiving her Emend/dexamethasone premedication-patient developed some mild rash/hives to her upper back with increased itching.  She denied any other hypersensitivity reactions whatsoever.  Demand/dexamethasone infusion was held; and confirmed that pt received benadryl, pepcid, and zofran as premedications. Pt was given solumedrol IV; and all symptoms completely resolved.   Patient was able to complete her chemotherapy today with no further difficulties whatsoever.  Patient has plans to return on 10/31/2014 for IV fluid rehydration and her scheduled Neupogen injection.  Have reviewed all with [name] regarding possible hypersensitivity reaction to the Emend/dexamethasone infusion.  [name] will review other options to prevent patient's chronic nausea postchemotherapy.

## 2014-10-30 ENCOUNTER — Other Ambulatory Visit: Payer: Self-pay | Admitting: Oncology

## 2014-10-31 ENCOUNTER — Ambulatory Visit: Payer: Medicaid Other

## 2014-10-31 ENCOUNTER — Telehealth: Payer: Self-pay | Admitting: Oncology

## 2014-10-31 ENCOUNTER — Ambulatory Visit (HOSPITAL_BASED_OUTPATIENT_CLINIC_OR_DEPARTMENT_OTHER): Payer: Medicaid Other

## 2014-10-31 VITALS — BP 118/67 | HR 69 | Temp 97.0°F | Resp 18

## 2014-10-31 DIAGNOSIS — T451X5A Adverse effect of antineoplastic and immunosuppressive drugs, initial encounter: Secondary | ICD-10-CM

## 2014-10-31 DIAGNOSIS — C482 Malignant neoplasm of peritoneum, unspecified: Secondary | ICD-10-CM | POA: Diagnosis not present

## 2014-10-31 DIAGNOSIS — Z5189 Encounter for other specified aftercare: Secondary | ICD-10-CM | POA: Diagnosis present

## 2014-10-31 DIAGNOSIS — R112 Nausea with vomiting, unspecified: Secondary | ICD-10-CM

## 2014-10-31 MED ORDER — SODIUM CHLORIDE 0.9 % IV SOLN
Freq: Once | INTRAVENOUS | Status: AC
  Administered 2014-10-31: 09:00:00 via INTRAVENOUS
  Filled 2014-10-31: qty 4

## 2014-10-31 MED ORDER — TBO-FILGRASTIM 300 MCG/0.5ML ~~LOC~~ SOSY
300.0000 ug | PREFILLED_SYRINGE | Freq: Once | SUBCUTANEOUS | Status: AC
  Administered 2014-10-31: 300 ug via SUBCUTANEOUS
  Filled 2014-10-31: qty 0.5

## 2014-10-31 MED ORDER — SODIUM CHLORIDE 0.45 % IV SOLN
1000.0000 mL | INTRAVENOUS | Status: DC
  Administered 2014-10-31: 1000 mL via INTRAVENOUS
  Filled 2014-10-31: qty 1000

## 2014-10-31 NOTE — Progress Notes (Signed)
Granix injection given by infusion nurse while receiving fluids.

## 2014-10-31 NOTE — Patient Instructions (Signed)
Dehydration, Adult Dehydration is when you lose more fluids from the body than you take in. Vital organs like the kidneys, brain, and heart cannot function without a proper amount of fluids and salt. Any loss of fluids from the body can cause dehydration.  CAUSES   Vomiting.  Diarrhea.  Excessive sweating.  Excessive urine output.  Fever. SYMPTOMS  Mild dehydration  Thirst.  Dry lips.  Slightly dry mouth. Moderate dehydration  Very dry mouth.  Sunken eyes.  Skin does not bounce back quickly when lightly pinched and released.  Dark urine and decreased urine production.  Decreased tear production.  Headache. Severe dehydration  Very dry mouth.  Extreme thirst.  Rapid, weak pulse (more than 100 beats per minute at rest).  Cold hands and feet.  Not able to sweat in spite of heat and temperature.  Rapid breathing.  Blue lips.  Confusion and lethargy.  Difficulty being awakened.  Minimal urine production.  No tears. DIAGNOSIS  Your caregiver will diagnose dehydration based on your symptoms and your exam. Blood and urine tests will help confirm the diagnosis. The diagnostic evaluation should also identify the cause of dehydration. TREATMENT  Treatment of mild or moderate dehydration can often be done at home by increasing the amount of fluids that you drink. It is best to drink small amounts of fluid more often. Drinking too much at one time can make vomiting worse. Refer to the home care instructions below. Severe dehydration needs to be treated at the hospital where you will probably be given intravenous (IV) fluids that contain water and electrolytes. HOME CARE INSTRUCTIONS   Ask your caregiver about specific rehydration instructions.  Drink enough fluids to keep your urine clear or pale yellow.  Drink small amounts frequently if you have nausea and vomiting.  Eat as you normally do.  Avoid:  Foods or drinks high in sugar.  Carbonated  drinks.  Juice.  Extremely hot or cold fluids.  Drinks with caffeine.  Fatty, greasy foods.  Alcohol.  Tobacco.  Overeating.  Gelatin desserts.  Wash your hands well to avoid spreading bacteria and viruses.  Only take over-the-counter or prescription medicines for pain, discomfort, or fever as directed by your caregiver.  Ask your caregiver if you should continue all prescribed and over-the-counter medicines.  Keep all follow-up appointments with your caregiver. SEEK MEDICAL CARE IF:  You have abdominal pain and it increases or stays in one area (localizes).  You have a rash, stiff neck, or severe headache.  You are irritable, sleepy, or difficult to awaken.  You are weak, dizzy, or extremely thirsty. SEEK IMMEDIATE MEDICAL CARE IF:   You are unable to keep fluids down or you get worse despite treatment.  You have frequent episodes of vomiting or diarrhea.  You have blood or green matter (bile) in your vomit.  You have blood in your stool or your stool looks black and tarry.  You have not urinated in 6 to 8 hours, or you have only urinated a small amount of very dark urine.  You have a fever.  You faint. MAKE SURE YOU:   Understand these instructions.  Will watch your condition.  Will get help right away if you are not doing well or get worse. Document Released: 05/27/2005 Document Revised: 08/19/2011 Document Reviewed: 01/14/2011 ExitCare Patient Information 2015 [employer]. This information is not intended to replace advice given to you by your health care provider. Make sure you discuss any questions you have with your health care   provider.  

## 2014-10-31 NOTE — Telephone Encounter (Signed)
cx appt per pof...the patient will get new sched today in tx.

## 2014-11-01 ENCOUNTER — Encounter: Payer: Self-pay | Admitting: Genetic Counselor

## 2014-11-01 ENCOUNTER — Ambulatory Visit (HOSPITAL_BASED_OUTPATIENT_CLINIC_OR_DEPARTMENT_OTHER): Payer: Medicaid Other

## 2014-11-01 VITALS — BP 152/89 | HR 92 | Temp 98.4°F | Resp 18

## 2014-11-01 DIAGNOSIS — C482 Malignant neoplasm of peritoneum, unspecified: Secondary | ICD-10-CM | POA: Diagnosis not present

## 2014-11-01 DIAGNOSIS — Z5189 Encounter for other specified aftercare: Secondary | ICD-10-CM | POA: Diagnosis present

## 2014-11-01 DIAGNOSIS — Z1379 Encounter for other screening for genetic and chromosomal anomalies: Secondary | ICD-10-CM

## 2014-11-01 MED ORDER — TBO-FILGRASTIM 300 MCG/0.5ML ~~LOC~~ SOSY
300.0000 ug | PREFILLED_SYRINGE | Freq: Once | SUBCUTANEOUS | Status: AC
  Administered 2014-11-01: 300 ug via SUBCUTANEOUS
  Filled 2014-11-01: qty 0.5

## 2014-11-01 NOTE — Patient Instructions (Signed)

## 2014-11-01 NOTE — Progress Notes (Signed)
GENETIC TEST RESULTS  Patient Name: Eilish Mcdaniel Patient Age: 49 y.o. Encounter Date: 11/01/2014  Referring Physician: Everitt Amber, MD   Ms. Steffek was called today to discuss genetic test results. Please see the Genetics note from her visit on 09/15/14 for a detailed discussion of her personal and family history.  GENETIC TESTING: At the time of Ms. Oliver-Staunton's visit, we recommended she pursue genetic testing of multiple genes on the OvaNext gene panel. This test, which included sequencing and deletion/duplication analysis of 24 genes, was performed at [facility]. Testing was normal and did not reveal a mutation in these genes. The genes tested were ATM, BARD1, BRCA1, BRCA2, BRIP1, CDH1, CHEK2, EPCAM, MLH1, MRE11A, MSH2, MSH6, MUTYH, NBN, NF1, PALB2, PMS2, PTEN, RAD50, RAD51C, RAD51D, SMARCA4, STK11, and TP53.  We discussed with Ms. Oliver-Carreras that since the current test is not perfect, it is possible there may be a gene mutation that current testing cannot detect, but that chance is small. We also discussed that it is possible that a different genetic factor, which was not part of this testing or has not yet been discovered, is responsible for the cancer diagnoses in the family. Should Ms. Oliver-Arseneault wish to discuss or pursue this additional testing, we are happy to coordinate this at any time, but do not feel that she is at significant risk of harboring a mutation in a different gene.     CANCER SCREENING: This result suggests that Ms. Oliver-Hallenbeck's cancer was most likely not due to an inherited predisposition. Most cancers happen by chance and this negative test, along with details of her family history, suggests that her cancer falls into this category. We, therefore, recommended she continue to follow the cancer screening guidelines provided by her physician.   FAMILY MEMBERS: Relatives are at some increased risk of developing cancer, over the general  population risk, simply due to the family history. We recommended women have a yearly mammogram beginning at age 12, a yearly clinical breast exam, and perform monthly breast self-exams. A gynecologic exam is recommended yearly. Colon cancer screening is recommended for men and women to begin by age 48.  Lastly, we discussed with Ms. Oliver-Height that cancer genetics is a rapidly advancing field and it is possible that new genetic tests will be appropriate for her in the future. We encouraged her to remain in contact with [place] on an annual basis so we can update her personal and family histories, and let her know of advances in cancer genetics that may benefit the family. Our contact number was provided. Ms. Brunelle questions were answered to her satisfaction today, and she knows she is welcome to call anytime with additional questions.    [name], MS, [place] Certified Genetic Counselor phone: [phone] [email or web address]

## 2014-11-02 ENCOUNTER — Other Ambulatory Visit: Payer: Self-pay

## 2014-11-02 ENCOUNTER — Ambulatory Visit (HOSPITAL_BASED_OUTPATIENT_CLINIC_OR_DEPARTMENT_OTHER): Payer: Medicaid Other

## 2014-11-02 ENCOUNTER — Ambulatory Visit: Payer: Medicaid Other

## 2014-11-02 VITALS — BP 138/83 | HR 75 | Temp 98.5°F

## 2014-11-02 DIAGNOSIS — C482 Malignant neoplasm of peritoneum, unspecified: Secondary | ICD-10-CM | POA: Diagnosis not present

## 2014-11-02 DIAGNOSIS — Z5189 Encounter for other specified aftercare: Secondary | ICD-10-CM

## 2014-11-02 DIAGNOSIS — K219 Gastro-esophageal reflux disease without esophagitis: Secondary | ICD-10-CM

## 2014-11-02 MED ORDER — PANTOPRAZOLE SODIUM 40 MG PO TBEC: 40.0000 mg | DELAYED_RELEASE_TABLET | Freq: Every day | ORAL | Status: DC

## 2014-11-02 MED ORDER — TBO-FILGRASTIM 300 MCG/0.5ML ~~LOC~~ SOSY
300.0000 ug | PREFILLED_SYRINGE | Freq: Once | SUBCUTANEOUS | Status: AC
  Administered 2014-11-02: 300 ug via SUBCUTANEOUS
  Filled 2014-11-02: qty 0.5

## 2014-11-02 NOTE — Progress Notes (Signed)
Sarah [name] came in today for injection and cc of acid reflux. Sent prescription to [facility] for protonix 40 mg tabs per Joylene John NP.

## 2014-11-03 ENCOUNTER — Ambulatory Visit (HOSPITAL_BASED_OUTPATIENT_CLINIC_OR_DEPARTMENT_OTHER): Payer: Medicaid Other | Admitting: Oncology

## 2014-11-03 ENCOUNTER — Telehealth: Payer: Self-pay | Admitting: Oncology

## 2014-11-03 ENCOUNTER — Encounter: Payer: Self-pay | Admitting: Oncology

## 2014-11-03 ENCOUNTER — Ambulatory Visit: Payer: Medicaid Other

## 2014-11-03 ENCOUNTER — Ambulatory Visit: Payer: Medicaid Other | Attending: Internal Medicine | Admitting: Internal Medicine

## 2014-11-03 ENCOUNTER — Other Ambulatory Visit (HOSPITAL_BASED_OUTPATIENT_CLINIC_OR_DEPARTMENT_OTHER): Payer: Medicaid Other

## 2014-11-03 ENCOUNTER — Encounter: Payer: Self-pay | Admitting: Internal Medicine

## 2014-11-03 VITALS — BP 103/71 | HR 83 | Temp 98.6°F | Resp 16 | Ht 65.0 in | Wt 200.0 lb

## 2014-11-03 VITALS — BP 135/81 | HR 68 | Temp 97.7°F | Resp 18 | Ht 65.0 in | Wt 199.6 lb

## 2014-11-03 DIAGNOSIS — R590 Localized enlarged lymph nodes: Secondary | ICD-10-CM

## 2014-11-03 DIAGNOSIS — D701 Agranulocytosis secondary to cancer chemotherapy: Secondary | ICD-10-CM

## 2014-11-03 DIAGNOSIS — R197 Diarrhea, unspecified: Secondary | ICD-10-CM | POA: Diagnosis not present

## 2014-11-03 DIAGNOSIS — R599 Enlarged lymph nodes, unspecified: Secondary | ICD-10-CM

## 2014-11-03 DIAGNOSIS — C482 Malignant neoplasm of peritoneum, unspecified: Secondary | ICD-10-CM | POA: Diagnosis present

## 2014-11-03 DIAGNOSIS — I10 Essential (primary) hypertension: Secondary | ICD-10-CM | POA: Insufficient documentation

## 2014-11-03 DIAGNOSIS — E876 Hypokalemia: Secondary | ICD-10-CM | POA: Diagnosis not present

## 2014-11-03 DIAGNOSIS — C561 Malignant neoplasm of right ovary: Secondary | ICD-10-CM | POA: Diagnosis not present

## 2014-11-03 DIAGNOSIS — G622 Polyneuropathy due to other toxic agents: Secondary | ICD-10-CM | POA: Diagnosis not present

## 2014-11-03 DIAGNOSIS — M545 Low back pain: Secondary | ICD-10-CM

## 2014-11-03 DIAGNOSIS — T451X5A Adverse effect of antineoplastic and immunosuppressive drugs, initial encounter: Secondary | ICD-10-CM

## 2014-11-03 DIAGNOSIS — R112 Nausea with vomiting, unspecified: Secondary | ICD-10-CM

## 2014-11-03 DIAGNOSIS — Z889 Allergy status to unspecified drugs, medicaments and biological substances status: Secondary | ICD-10-CM

## 2014-11-03 LAB — COMPREHENSIVE METABOLIC PANEL (CC13)
ALBUMIN: 3.7 g/dL (ref 3.5–5.0)
ALK PHOS: 79 U/L (ref 40–150)
ALT: 21 U/L (ref 0–55)
ANION GAP: 13 meq/L — AB (ref 3–11)
AST: 21 U/L (ref 5–34)
BUN: 17.7 mg/dL (ref 7.0–26.0)
CALCIUM: 8.7 mg/dL (ref 8.4–10.4)
CHLORIDE: 96 meq/L — AB (ref 98–109)
CO2: 28 mEq/L (ref 22–29)
Creatinine: 1 mg/dL (ref 0.6–1.1)
EGFR: 78 mL/min/{1.73_m2} — AB (ref >90)
Glucose: 122 mg/dl (ref 70–140)
Potassium: 2.9 mEq/L — CL (ref 3.5–5.1)
Sodium: 136 mEq/L (ref 136–145)
TOTAL PROTEIN: 7.3 g/dL (ref 6.4–8.3)
Total Bilirubin: 0.6 mg/dL (ref 0.20–1.20)

## 2014-11-03 LAB — CBC WITH DIFFERENTIAL/PLATELET
BASO%: 0.1 % (ref 0.0–2.0)
Basophils Absolute: 0 10*3/uL (ref 0.0–0.1)
EOS ABS: 0 10*3/uL (ref 0.0–0.5)
EOS%: 0.1 % (ref 0.0–7.0)
HCT: 29.2 % — ABNORMAL LOW (ref 34.8–46.6)
HEMOGLOBIN: 9.9 g/dL — AB (ref 11.6–15.9)
LYMPH%: 25.1 % (ref 14.0–49.7)
MCH: 30.8 pg (ref 25.1–34.0)
MCHC: 33.9 g/dL (ref 31.5–36.0)
MCV: 91 fL (ref 79.5–101.0)
MONO#: 1.1 10*3/uL — AB (ref 0.1–0.9)
MONO%: 8 % (ref 0.0–14.0)
NEUT%: 66.7 % (ref 38.4–76.8)
NEUTROS ABS: 9.1 10*3/uL — AB (ref 1.5–6.5)
Platelets: 286 10*3/uL (ref 145–400)
RBC: 3.21 10*6/uL — ABNORMAL LOW (ref 3.70–5.45)
RDW: 16.9 % — ABNORMAL HIGH (ref 11.2–14.5)
WBC: 13.6 10*3/uL — AB (ref 3.9–10.3)
lymph#: 3.4 10*3/uL — ABNORMAL HIGH (ref 0.9–3.3)

## 2014-11-03 MED ORDER — POTASSIUM CHLORIDE CRYS ER 20 MEQ PO TBCR: EXTENDED_RELEASE_TABLET | ORAL | Status: DC

## 2014-11-03 NOTE — Progress Notes (Signed)
OFFICE PROGRESS NOTE   Nov 03, 2014   Physicians:Emma Rossi/ W. 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 She developed hives immediately after administration of EMEND (I believe EMEND stopped prior to completing then). Symptoms resolved with additional solumedrol and chemotherapy infusion continued.  Plan is repeat scans (including chest) and follow up with gyn oncology after cycle 6 carbo taxol; possible maintenance Megace per Dr [name].  Hives did not resume after leaving office on 10-27-14. Note urticaria reported <3% with EMEND; this would not have been related to decadron. Patient has had itching intermittently including during week prior to chemo, without hives. This may be due to oxycodone, which she has used for taxol and gCSF aches, and she will try not to use the oxycodone now.  Patient had severe nausea and vomiting with cycles 1 and 2, prior to adding EMEND with cycle 3. She also had IVF on day 5 cycle 4, which were helpful. Nausea has improved now. She had diarrhea last 3 days, multiple watery stools first 2 days, yesterday 3-4 episodes and none so far today. She has not had diarrhea prevously and no one at home is ill. She has been drinking only water with the diarrhea, discussed. Aches have resolved. She has had no fever and no severe abdominal cramping. No worsening peripheral neuropathy fingers and feet.   No PAC Genetics testing sent with OvaNext panel on 09-15-14, pending due to Medicaid status as of note from genetics counselor 09-28-14. CA 125 on 06-30-14 58   ONCOLOGIC HISTORY Patient presented to ED 06-28-14 with fairly acute onset of low back pain, without known trauma, so severe at that time that she was having difficulty standing and  walking; blood pressure in ED was 260/148 such that she was admitted for hypertensive urgency. Evaluation included CT CAP 06-28-14 which demonstrated a 4.4 cm right adnexal mass, with stranding in omentum and small fluid in pelvis, as well as mild mediastinal and hilar adenopathy. US showed 4.6 x 5.2 x 3.8 cm apparent complex ovarian mass. CA 125 was 58 on 06-30-14. She was seen in consultation by Dr [name] on 07-08-14. Imaging otherwise showed no aortic aneurysm; MRI of L spine 06-29-14 found some facet hypertrophy and small right paracentral disc protrusion L5S1. Blood pressure was treated and patient established with [facility], Dr Doreene Burke.  She was stable for surgery by Dr Skeet Latch 07-28-14, which was exploratory laparotomy with BSO, infragastric omentectomy, washings and biopsies and appendectomy. Pathology [phone]) found low grade serous carcinoma thruout. Post operative course was unremarkable. She saw Dr [name] on 08-03-14, with recommendation for 6 cycles of taxol and carboplatin then consideration of Megace maintenance; she will see Dr [name] again after chemo completes. Cycle 1 carbo taxol was 08-25-14, with leukopenia by day 8 such that granix was added x 3 doses. Delayed nausea was a problem with initial cycles, EMEND approved for cycle 3, helpful with nausea but likely allergic reaction to this cycle 4.      Review of systems as above, also: Is voiding, not orthostatic. No SOB or other respiratory symptoms. No itching now.  Remainder of 10 point Review of Systems negative.  Objective:  Vital signs in last 24 hours:  BP 135/81 mmHg  Pulse 68  Temp(Src) 97.7 F (  36.5 C) (Oral)  Resp 18  Ht $R'5\' 5"'AR$  (1.651 m)  Wt 199 lb 9.6 oz (90.538 kg)  BMI 33.22 kg/m2  SpO2 100% Weight down 7 lbs Alert, oriented and appropriate. Ambulatory without difficulty.  Alopecia  HEENT:PERRL, sclerae not icteric. Oral mucosa moist without lesions, posterior pharynx clear.  Neck supple. No  JVD.  Lymphatics:no cervical,supraclavicular, axillary or inguinal adenopathy Resp: clear to auscultation bilaterally and normal percussion bilaterally Cardio: regular rate and rhythm. No gallop. GI: soft, nontender, not distended, no mass or organomegaly. Few bowel sounds. Surgical incision not remarkable. Musculoskeletal/ Extremities: without pitting edema, cords, tenderness Neuro: no significant peripheral neuropathy. Otherwise nonfocal. PSYCH appropriate mood and affect Skin without rash, ecchymosis, petechiae, hives   Lab Results:  Results for orders placed or performed in visit on 11/03/14  CBC with Differential  Result Value Ref Range   WBC 13.6 (H) 3.9 - 10.3 10e3/uL   NEUT# 9.1 (H) 1.5 - 6.5 10e3/uL   HGB 9.9 (L) 11.6 - 15.9 g/dL   HCT 29.2 (L) 34.8 - 46.6 %   Platelets 286 145 - 400 10e3/uL   MCV 91.0 79.5 - 101.0 fL   MCH 30.8 25.1 - 34.0 pg   MCHC 33.9 31.5 - 36.0 g/dL   RBC 3.21 (L) 3.70 - 5.45 10e6/uL   RDW 16.9 (H) 11.2 - 14.5 %   lymph# 3.4 (H) 0.9 - 3.3 10e3/uL   MONO# 1.1 (H) 0.1 - 0.9 10e3/uL   Eosinophils Absolute 0.0 0.0 - 0.5 10e3/uL   Basophils Absolute 0.0 0.0 - 0.1 10e3/uL   NEUT% 66.7 38.4 - 76.8 %   LYMPH% 25.1 14.0 - 49.7 %   MONO% 8.0 0.0 - 14.0 %   EOS% 0.1 0.0 - 7.0 %   BASO% 0.1 0.0 - 2.0 %  Comprehensive metabolic panel (Cmet) - [facility]  Result Value Ref Range   Sodium 136 136 - 145 mEq/L   Potassium 2.9 (LL) 3.5 - 5.1 mEq/L   Chloride 96 (L) 98 - 109 mEq/L   CO2 28 22 - 29 mEq/L   Glucose 122 70 - 140 mg/dl   BUN 17.7 7.0 - 26.0 mg/dL   Creatinine 1.0 0.6 - 1.1 mg/dL   Total Bilirubin 0.60 0.20 - 1.20 mg/dL   Alkaline Phosphatase 79 40 - 150 U/L   AST 21 5 - 34 U/L   ALT 21 0 - 55 U/L   Total Protein 7.3 6.4 - 8.3 g/dL   Albumin 3.7 3.5 - 5.0 g/dL   Calcium 8.7 8.4 - 10.4 mg/dL   Anion Gap 13 (H) 3 - 11 mEq/L   EGFR 78 (L) >90 ml/min/1.73 m2    Potassium result available while patient still at office, consistent with diarrhea, plan  as below  C diff collection material given, with instructions to return if any further diarrhea.   Studies/Results:  No results found.  Medications: I have reviewed the patient's current medications. DC EMEND from chemo premeds due to apparent allergy.  Other diffuse itching away from chemo infusions possibly oxycodone, which she will try to avoid.  Add oral K 40 m#q today then 20 mEq daily #7.  DISCUSSION: Meds as above. Will add additional IVF after chemo due to severe N/V after first chemo cycles. If any further diarrhea, needs to return stool specimen for C diff. Push gatorade today and any time with diarrhea.  Assessment/Plan:  1. 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 She will have granix x 3 doses after chemo. I will see her on 6-16 prior to cycle 6. 2.diarrhea x 3 days with hypokalemia: symptoms seem to be improving, but will check C diff if recurs. Potassium and gatorade now. 3.Hypertensive urgency at presentation, BP variable but in better range now especially on home monitoring, followed by PCP with [facility] 4.overdue mammograms, which we will address when possible. Hopefully can get mammogram scholarship 5.family history cancers also, genetics testing in process, delayed by Medicaid pending. Message sent now to financial staff and genetics counselor, as EMR now lists Medicaid as insurance, so may be able to proceed with testing. 6.low back pain: does not seem related to the cancer diagnosis, some degenerative change seen on MRI L spine. Referral requested to Dr Nelva Bush at last visit 7.social concerns: Medicaid had been pending, may be approved now 8.Mild mediastinal and hilar adenopathy of uncertain significance, seen on CT 06-28-14. Will follow with repeat scans after chemotherapy 9.increased peripheral  neuropathy related to taxol in feet: will decrease taxol dose from 175/m2 to 135/m2 beginning cycle 4. May need to add B complex, gabapentin or elavil. 10. Post partial thyroidectomy   IVF orders entered with IV zofran/decadron after chemo cycle 5. Chemo and granix orders confirmed. All questions answered and patient understands EMEND concerns. She will call if needed prior to next scheduled appointment. Time spent 30 min including >50% counseling and coordination of care.    Oriyah Lamphear P, MD   11/03/2014, 11:58 AM

## 2014-11-03 NOTE — Progress Notes (Addendum)
Subjective:     Patient ID: Sarah [name], female   DOB: [birth year], 49 y.o.   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   Patient reports taking all of her medications everyday without missing any doses. She has 2 more cycles of chemotherapy left. Reports appetite has been decreased due to chemo, but overall eats a healthy diet rich in fruits and low in salt. Notes she drinks plenty of water everyday. Notes she exercises by taking a 20-30 min walk around her neighborhood several times a week.  Denies headaches, blurry vision, urinary symptoms, constipation, blood in urine or stool. No fever, chest pain, SOB, dyspnea, palpitations.  Reports she experiences tolerable chemotherapy-associated symptoms such as chills, nausea, vomiting, diarrhea, cold and heat intolerance, numbness and tingling in bilateral lower extremities, and some light-headedness. Uses ibuprofen occasionally for chemotherapy-associated pain.  Active Ambulatory Problems    Diagnosis Date Noted  . Hypertensive urgency 06/28/2014  . Adnexal mass   . Essential hypertension   . Pulmonary nodule   . Hilar adenopathy   . Ovarian cancer 07/08/2014  . Pelvic mass in female 07/28/2014  . Primary peritoneal carcinomatosis 08/21/2014  . Leg pain 08/26/2014  . Family history of colon cancer   . Constipation - functional 09/24/2014  . Chemotherapy-induced peripheral neuropathy 09/24/2014  . Chemotherapy induced nausea and vomiting 09/24/2014  . Leukopenia due to antineoplastic chemotherapy 09/24/2014  . Midline low back pain without sciatica 09/24/2014  . Hypersensitivity reaction 10/27/2014  . Genetic testing 11/01/2014   Resolved Ambulatory Problems    Diagnosis Date Noted  .  Accelerated hypertension 06/28/2014   Past Medical History  Diagnosis Date  . Hypertension   . 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 Shortness of breath dyspnea   . Glaucoma 02/16  . Peritoneal carcinomatosis      Medication List       This list is accurate as of: 11/03/14  2:44 PM.  Always use your most recent med list.               acetaminophen 500 MG tablet  Commonly known as:  TYLENOL  Take 1,000 mg by mouth every 6 (six) hours as needed for mild pain or headache.     amLODipine 10 MG tablet  Commonly known as:  NORVASC  Take 1 tablet (10 mg total) by mouth daily.     atorvastatin 20 MG tablet  Commonly known as:  LIPITOR  Take 1 tablet (20 mg total) by mouth daily.     dexamethasone 4 MG tablet  Commonly known as:  DECADRON  Take 5 tablets (=20mg ) by mouth with food 12 hours and 6 hours prior to chemo.     diphenhydrAMINE 25 mg capsule  Commonly known as:  BENADRYL  Take 1 every 4 to 6 hours for sleep     docusate sodium 100 MG capsule  Commonly known as:  COLACE  Take 100 mg by mouth 2 (two) times daily as needed.     ferrous fumarate 325 (106 FE) MG Tabs tablet  Commonly known as:  HEMOCYTE - 106 mg FE  Take 1 tablet (106 mg of iron total) by mouth daily. On empty stomach with orange juice     lisinopril-hydrochlorothiazide 10-12.5 MG per tablet  Commonly known as:  PRINZIDE,ZESTORETIC  Take  1 tablet by mouth daily.     loratadine 10 MG tablet  Commonly known as:  CLARITIN  Take 1 tablet (10 mg total) by mouth daily. As needed for taxol/granix aches     LORazepam 1 MG tablet  Commonly known as:  ATIVAN  Place 1 tablet under tongue or swallow every 6 hours as needed for nausea. Will make drowsy.     metoprolol tartrate 25 MG tablet  Commonly known as:  LOPRESSOR  TAKE 1/2 TABLET BY MOUTH 2 TIMES A DAY     ondansetron 8 MG tablet  Commonly known as:  ZOFRAN  Take 1 tablet by mouth every 8 hours as needed for nausea. Will not make drowsy.     Oxycodone HCl  10 MG Tabs  Take 1 tablet (10 mg total) by mouth every 6 (six) hours as needed.     pantoprazole 40 MG tablet  Commonly known as:  PROTONIX  Take 1 tablet (40 mg total) by mouth daily.     polyethylene glycol packet  Commonly known as:  MIRALAX  Take 17 g by mouth 2 (two) times daily as needed.     potassium chloride SA 20 MEQ tablet  Commonly known as:  K-DUR,KLOR-CON  Take 2 tablets today 11-03-14 then 1 tab daily x 5 days.     temazepam 15 MG capsule  Commonly known as:  RESTORIL  Take 1 capsule (15 mg total) by mouth at bedtime as needed for sleep.       Review of Systems  Constitutional: Negative for fever and activity change.  Eyes: Negative for visual disturbance.  Respiratory: Negative for cough, chest tightness and shortness of breath.   Cardiovascular: Negative for chest pain, palpitations and leg swelling.  Gastrointestinal: Negative for abdominal pain, constipation and blood in stool.  Genitourinary: Negative for dysuria, hematuria and difficulty urinating.  Neurological: Negative for syncope and headaches.    Objective: Filed Vitals:   11/03/14 1414  BP: 103/71  Pulse: 83  Temp: 98.6 F (37 C)  Resp: 16      Physical Exam  Constitutional:  Well-appearing, very pleasant 49 yo AA woman alert, oriented, and in no acute distress.  HENT:  Head: Normocephalic and atraumatic.  Mouth/Throat: Oropharynx is clear and moist.  Eyes: Conjunctivae are normal. Pupils are equal, round, and reactive to light. No scleral icterus.  Neck: Normal range of motion. Neck supple.  Cardiovascular: Normal rate, regular rhythm, normal heart sounds and intact distal pulses.   Pulmonary/Chest: Effort normal and breath sounds normal. She exhibits no tenderness.  Abdominal: Soft. Bowel sounds are normal. She exhibits no distension.  Musculoskeletal: Normal range of motion. She exhibits no edema or tenderness.   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    Essential Hypertension  Patient's BP today is 103/71 and appears well-controlled. Counseled patient on importance of continuing medication adherence, DASH diet, exercise. Advised patient to drink plenty of water and stay well-hydrated to avoid episodes of light-headedness. No medication adjustment needed at this time.  Hypokalemia CMP results from patient's oncology appointment this morning showed hypokalemia with K+ of 2.9. Patient reports that oncologist suggested patient try increased gatorade intake and diet rich in potassium. Agree with oncologist's plan. Since K+ is very low, scheduled patient for f/u  in 1 week to recheck labs and start patient on K+ supplement if necessary.   Evaluation and management procedures were performed by me with Medical Student in attendance, note written by medical student under my supervision and collaboration. I have reviewed the note and I agree with the management and plan.   Angelica Chessman, MD, [place], [employer], [place], [place] and [facility] [place], [place]

## 2014-11-03 NOTE — Telephone Encounter (Signed)
Appointments made and avs printed for patient °

## 2014-11-03 NOTE — Progress Notes (Signed)
F/U HTN 

## 2014-11-03 NOTE — Patient Instructions (Signed)
DASH Eating Plan DASH stands for "Dietary Approaches to Stop Hypertension." The DASH eating plan is a healthy eating plan that has been shown to reduce high blood pressure (hypertension). Additional health benefits may include reducing the risk of type 2 diabetes mellitus, heart disease, and stroke. The DASH eating plan may also help with weight loss. WHAT DO I NEED TO KNOW ABOUT THE DASH EATING PLAN? For the DASH eating plan, you will follow these general guidelines:  Choose foods with a percent daily value for sodium of less than 5% (as listed on the food label).  Use salt-free seasonings or herbs instead of table salt or sea salt.  Check with your health care provider or pharmacist before using salt substitutes.  Eat lower-sodium products, often labeled as "lower sodium" or "no salt added."  Eat fresh foods.  Eat more vegetables, fruits, and low-fat dairy products.  Choose whole grains. Look for the word "whole" as the first word in the ingredient list.  Choose fish and skinless chicken or turkey more often than red meat. Limit fish, poultry, and meat to 6 oz (170 g) each day.  Limit sweets, desserts, sugars, and sugary drinks.  Choose heart-healthy fats.  Limit cheese to 1 oz (28 g) per day.  Eat more home-cooked food and less restaurant, buffet, and fast food.  Limit fried foods.  Cook foods using methods other than frying.  Limit canned vegetables. If you do use them, rinse them well to decrease the sodium.  When eating at a restaurant, ask that your food be prepared with less salt, or no salt if possible. WHAT FOODS CAN I EAT? Seek help from a dietitian for individual calorie needs. Grains Whole grain or whole wheat bread. Brown rice. Whole grain or whole wheat pasta. Quinoa, bulgur, and whole grain cereals. Low-sodium cereals. Corn or whole wheat flour tortillas. Whole grain cornbread. Whole grain crackers. Low-sodium crackers. Vegetables Fresh or frozen vegetables  (raw, steamed, roasted, or grilled). Low-sodium or reduced-sodium tomato and vegetable juices. Low-sodium or reduced-sodium tomato sauce and paste. Low-sodium or reduced-sodium canned vegetables.  Fruits All fresh, canned (in natural juice), or frozen fruits. Meat and Other Protein Products Ground beef (85% or leaner), grass-fed beef, or beef trimmed of fat. Skinless chicken or turkey. Ground chicken or turkey. Pork trimmed of fat. All fish and seafood. Eggs. Dried beans, peas, or lentils. Unsalted nuts and seeds. Unsalted canned beans. Dairy Low-fat dairy products, such as skim or 1% milk, 2% or reduced-fat cheeses, low-fat ricotta or cottage cheese, or plain low-fat yogurt. Low-sodium or reduced-sodium cheeses. Fats and Oils Tub margarines without trans fats. Light or reduced-fat mayonnaise and salad dressings (reduced sodium). Avocado. Safflower, olive, or canola oils. Natural peanut or almond butter. Other Unsalted popcorn and pretzels. The items listed above may not be a complete list of recommended foods or beverages. Contact your dietitian for more options. WHAT FOODS ARE NOT RECOMMENDED? Grains White bread. White pasta. White rice. Refined cornbread. Bagels and croissants. Crackers that contain trans fat. Vegetables Creamed or fried vegetables. Vegetables in a cheese sauce. Regular canned vegetables. Regular canned tomato sauce and paste. Regular tomato and vegetable juices. Fruits Dried fruits. Canned fruit in light or heavy syrup. Fruit juice. Meat and Other Protein Products Fatty cuts of meat. Ribs, chicken wings, bacon, sausage, bologna, salami, chitterlings, fatback, hot dogs, bratwurst, and packaged luncheon meats. Salted nuts and seeds. Canned beans with salt. Dairy Whole or 2% milk, cream, half-and-half, and cream cheese. Whole-fat or sweetened yogurt. Full-fat   cheeses or blue cheese. Nondairy creamers and whipped toppings. Processed cheese, cheese spreads, or cheese  curds. Condiments Onion and garlic salt, seasoned salt, table salt, and sea salt. Canned and packaged gravies. Worcestershire sauce. Tartar sauce. Barbecue sauce. Teriyaki sauce. Soy sauce, including reduced sodium. Steak sauce. Fish sauce. Oyster sauce. Cocktail sauce. Horseradish. Ketchup and mustard. Meat flavorings and tenderizers. Bouillon cubes. Hot sauce. Tabasco sauce. Marinades. Taco seasonings. Relishes. Fats and Oils Butter, stick margarine, lard, shortening, ghee, and bacon fat. Coconut, palm kernel, or palm oils. Regular salad dressings. Other Pickles and olives. Salted popcorn and pretzels. The items listed above may not be a complete list of foods and beverages to avoid. Contact your dietitian for more information. WHERE CAN I FIND MORE INFORMATION? National Heart, Lung, and Blood Institute: www.nhlbi.nih.gov/health/health-topics/topics/dash/ Document Released: 05/16/2011 Document Revised: 10/11/2013 Document Reviewed: 03/31/2013 ExitCare Patient Information 2015 [employer]. This information is not intended to replace advice given to you by your health care provider. Make sure you discuss any questions you have with your health care provider. Hypertension Hypertension, commonly called high blood pressure, is when the force of blood pumping through your arteries is too strong. Your arteries are the blood vessels that carry blood from your heart throughout your body. A blood pressure reading consists of a higher number over a lower number, such as 110/72. The higher number (systolic) is the pressure inside your arteries when your heart pumps. The lower number (diastolic) is the pressure inside your arteries when your heart relaxes. Ideally you want your blood pressure below 120/80. Hypertension forces your heart to work harder to pump blood. Your arteries may become narrow or stiff. Having hypertension puts you at risk for heart disease, stroke, and other problems.  RISK  FACTORS Some risk factors for high blood pressure are controllable. Others are not.  Risk factors you cannot control include:   Race. You may be at higher risk if you are African American.  Age. Risk increases with age.  Gender. Men are at higher risk than women before age 45 years. After age 65, women are at higher risk than men. Risk factors you can control include:  Not getting enough exercise or physical activity.  Being overweight.  Getting too much fat, sugar, calories, or salt in your diet.  Drinking too much alcohol. SIGNS AND SYMPTOMS Hypertension does not usually cause signs or symptoms. Extremely high blood pressure (hypertensive crisis) may cause headache, anxiety, shortness of breath, and nosebleed. DIAGNOSIS  To check if you have hypertension, your health care provider will measure your blood pressure while you are seated, with your arm held at the level of your heart. It should be measured at least twice using the same arm. Certain conditions can cause a difference in blood pressure between your right and left arms. A blood pressure reading that is higher than normal on one occasion does not mean that you need treatment. If one blood pressure reading is high, ask your health care provider about having it checked again. TREATMENT  Treating high blood pressure includes making lifestyle changes and possibly taking medicine. Living a healthy lifestyle can help lower high blood pressure. You may need to change some of your habits. Lifestyle changes may include:  Following the DASH diet. This diet is high in fruits, vegetables, and whole grains. It is low in salt, red meat, and added sugars.  Getting at least 2 hours of brisk physical activity every week.  Losing weight if necessary.  Not smoking.  Limiting   alcoholic beverages.  Learning ways to reduce stress. If lifestyle changes are not enough to get your blood pressure under control, your health care provider may  prescribe medicine. You may need to take more than one. Work closely with your health care provider to understand the risks and benefits. HOME CARE INSTRUCTIONS  Have your blood pressure rechecked as directed by your health care provider.   Take medicines only as directed by your health care provider. Follow the directions carefully. Blood pressure medicines must be taken as prescribed. The medicine does not work as well when you skip doses. Skipping doses also puts you at risk for problems.   Do not smoke.   Monitor your blood pressure at home as directed by your health care provider. SEEK MEDICAL CARE IF:   You think you are having a reaction to medicines taken.  You have recurrent headaches or feel dizzy.  You have swelling in your ankles.  You have trouble with your vision. SEEK IMMEDIATE MEDICAL CARE IF:  You develop a severe headache or confusion.  You have unusual weakness, numbness, or feel faint.  You have severe chest or abdominal pain.  You vomit repeatedly.  You have trouble breathing. MAKE SURE YOU:   Understand these instructions.  Will watch your condition.  Will get help right away if you are not doing well or get worse. Document Released: 05/27/2005 Document Revised: 10/11/2013 Document Reviewed: 03/19/2013 ExitCare Patient Information 2015 [employer]. This information is not intended to replace advice given to you by your health care provider. Make sure you discuss any questions you have with your health care provider.  

## 2014-11-04 ENCOUNTER — Ambulatory Visit: Payer: Self-pay

## 2014-11-05 ENCOUNTER — Ambulatory Visit: Payer: Self-pay

## 2014-11-08 ENCOUNTER — Ambulatory Visit: Payer: Self-pay

## 2014-11-17 ENCOUNTER — Other Ambulatory Visit (HOSPITAL_BASED_OUTPATIENT_CLINIC_OR_DEPARTMENT_OTHER): Payer: Medicaid Other

## 2014-11-17 ENCOUNTER — Ambulatory Visit (HOSPITAL_BASED_OUTPATIENT_CLINIC_OR_DEPARTMENT_OTHER): Payer: Medicaid Other

## 2014-11-17 VITALS — BP 148/85 | HR 91 | Temp 98.9°F | Resp 18

## 2014-11-17 DIAGNOSIS — C482 Malignant neoplasm of peritoneum, unspecified: Secondary | ICD-10-CM

## 2014-11-17 DIAGNOSIS — Z5111 Encounter for antineoplastic chemotherapy: Secondary | ICD-10-CM

## 2014-11-17 LAB — COMPREHENSIVE METABOLIC PANEL (CC13)
ALBUMIN: 3.6 g/dL (ref 3.5–5.0)
ALK PHOS: 83 U/L (ref 40–150)
ALT: 16 U/L (ref 0–55)
ANION GAP: 15 meq/L — AB (ref 3–11)
AST: 21 U/L (ref 5–34)
BILIRUBIN TOTAL: 0.4 mg/dL (ref 0.20–1.20)
BUN: 14.3 mg/dL (ref 7.0–26.0)
CALCIUM: 9 mg/dL (ref 8.4–10.4)
CO2: 22 mEq/L (ref 22–29)
CREATININE: 1 mg/dL (ref 0.6–1.1)
Chloride: 102 mEq/L (ref 98–109)
EGFR: 74 mL/min/{1.73_m2} — ABNORMAL LOW (ref >90)
Glucose: 234 mg/dl — ABNORMAL HIGH (ref 70–140)
Potassium: 3.6 mEq/L (ref 3.5–5.1)
Sodium: 139 mEq/L (ref 136–145)
TOTAL PROTEIN: 8.3 g/dL (ref 6.4–8.3)

## 2014-11-17 LAB — CBC WITH DIFFERENTIAL/PLATELET
BASO%: 0.4 % (ref 0.0–2.0)
BASOS ABS: 0 10*3/uL (ref 0.0–0.1)
EOS%: 0 % (ref 0.0–7.0)
Eosinophils Absolute: 0 10*3/uL (ref 0.0–0.5)
HEMATOCRIT: 28.6 % — AB (ref 34.8–46.6)
HGB: 9.8 g/dL — ABNORMAL LOW (ref 11.6–15.9)
LYMPH%: 16.4 % (ref 14.0–49.7)
MCH: 31.9 pg (ref 25.1–34.0)
MCHC: 34.2 g/dL (ref 31.5–36.0)
MCV: 93.2 fL (ref 79.5–101.0)
MONO#: 0.2 10*3/uL (ref 0.1–0.9)
MONO%: 2.3 % (ref 0.0–14.0)
NEUT#: 8.4 10*3/uL — ABNORMAL HIGH (ref 1.5–6.5)
NEUT%: 80.9 % — AB (ref 38.4–76.8)
Platelets: 170 10*3/uL (ref 145–400)
RBC: 3.07 10*6/uL — ABNORMAL LOW (ref 3.70–5.45)
RDW: 20.5 % — ABNORMAL HIGH (ref 11.2–14.5)
WBC: 10.3 10*3/uL (ref 3.9–10.3)
lymph#: 1.7 10*3/uL (ref 0.9–3.3)

## 2014-11-17 MED ORDER — SODIUM CHLORIDE 0.9 % IV SOLN: 20.0000 mg | Freq: Once | INTRAVENOUS | Status: DC

## 2014-11-17 MED ORDER — CARBOPLATIN CHEMO INJECTION 600 MG/60ML
750.0000 mg | Freq: Once | INTRAVENOUS | Status: AC
  Administered 2014-11-17: 750 mg via INTRAVENOUS
  Filled 2014-11-17: qty 75

## 2014-11-17 MED ORDER — SODIUM CHLORIDE 0.9 % IV SOLN
Freq: Once | INTRAVENOUS | Status: AC
  Administered 2014-11-17: 10:00:00 via INTRAVENOUS
  Filled 2014-11-17: qty 8

## 2014-11-17 MED ORDER — DIPHENHYDRAMINE HCL 50 MG/ML IJ SOLN
25.0000 mg | Freq: Once | INTRAMUSCULAR | Status: AC
  Administered 2014-11-17: 25 mg via INTRAVENOUS

## 2014-11-17 MED ORDER — ONDANSETRON HCL 40 MG/20ML IJ SOLN: Freq: Once | INTRAMUSCULAR | Status: DC

## 2014-11-17 MED ORDER — SODIUM CHLORIDE 0.9 % IV SOLN
Freq: Once | INTRAVENOUS | Status: AC
  Administered 2014-11-17: 10:00:00 via INTRAVENOUS

## 2014-11-17 MED ORDER — FAMOTIDINE IN NACL 20-0.9 MG/50ML-% IV SOLN
20.0000 mg | Freq: Once | INTRAVENOUS | Status: AC
  Administered 2014-11-17: 20 mg via INTRAVENOUS

## 2014-11-17 MED ORDER — PACLITAXEL CHEMO INJECTION 300 MG/50ML
135.0000 mg/m2 | Freq: Once | INTRAVENOUS | Status: AC
  Administered 2014-11-17: 282 mg via INTRAVENOUS
  Filled 2014-11-17: qty 47

## 2014-11-17 MED ORDER — FAMOTIDINE IN NACL 20-0.9 MG/50ML-% IV SOLN
INTRAVENOUS | Status: AC
  Filled 2014-11-17: qty 50

## 2014-11-17 MED ORDER — DIPHENHYDRAMINE HCL 50 MG/ML IJ SOLN
INTRAMUSCULAR | Status: AC
  Filled 2014-11-17: qty 1

## 2014-11-17 NOTE — Patient Instructions (Signed)
[facility] Discharge Instructions for Patients Receiving Chemotherapy  Today you received the following chemotherapy agents Taxol/Carboplatin  To help prevent nausea and vomiting after your treatment, we encourage you to take your nausea medication    If you develop nausea and vomiting that is not controlled by your nausea medication, call the clinic.   BELOW ARE SYMPTOMS THAT SHOULD BE REPORTED IMMEDIATELY:  *FEVER GREATER THAN 100.5 F  *CHILLS WITH OR WITHOUT FEVER  NAUSEA AND VOMITING THAT IS NOT CONTROLLED WITH YOUR NAUSEA MEDICATION  *UNUSUAL SHORTNESS OF BREATH  *UNUSUAL BRUISING OR BLEEDING  TENDERNESS IN MOUTH AND THROAT WITH OR WITHOUT PRESENCE OF ULCERS  *URINARY PROBLEMS  *BOWEL PROBLEMS  UNUSUAL RASH Items with * indicate a potential emergency and should be followed up as soon as possible.  Feel free to call the clinic you have any questions or concerns. The clinic phone number is [phone].  Please show the CHEMO ALERT CARD at check-in to the Emergency Department and triage nurse.   

## 2014-11-18 ENCOUNTER — Other Ambulatory Visit: Payer: Self-pay | Admitting: Oncology

## 2014-11-19 ENCOUNTER — Ambulatory Visit (HOSPITAL_BASED_OUTPATIENT_CLINIC_OR_DEPARTMENT_OTHER): Payer: Medicaid Other

## 2014-11-19 VITALS — BP 122/72 | HR 67 | Temp 97.5°F | Resp 20

## 2014-11-19 DIAGNOSIS — C482 Malignant neoplasm of peritoneum, unspecified: Secondary | ICD-10-CM | POA: Diagnosis present

## 2014-11-19 MED ORDER — SODIUM CHLORIDE 0.45 % IV SOLN
INTRAVENOUS | Status: DC
  Administered 2014-11-19: 09:00:00 via INTRAVENOUS

## 2014-11-19 MED ORDER — SODIUM CHLORIDE 0.9 % IV SOLN
Freq: Once | INTRAVENOUS | Status: AC
  Administered 2014-11-19: 09:00:00 via INTRAVENOUS

## 2014-11-19 NOTE — Patient Instructions (Signed)
Dehydration, Adult Dehydration is when you lose more fluids from the body than you take in. Vital organs like the kidneys, brain, and heart cannot function without a proper amount of fluids and salt. Any loss of fluids from the body can cause dehydration.  CAUSES   Vomiting.  Diarrhea.  Excessive sweating.  Excessive urine output.  Fever. SYMPTOMS  Mild dehydration  Thirst.  Dry lips.  Slightly dry mouth. Moderate dehydration  Very dry mouth.  Sunken eyes.  Skin does not bounce back quickly when lightly pinched and released.  Dark urine and decreased urine production.  Decreased tear production.  Headache. Severe dehydration  Very dry mouth.  Extreme thirst.  Rapid, weak pulse (more than 100 beats per minute at rest).  Cold hands and feet.  Not able to sweat in spite of heat and temperature.  Rapid breathing.  Blue lips.  Confusion and lethargy.  Difficulty being awakened.  Minimal urine production.  No tears. DIAGNOSIS  Your caregiver will diagnose dehydration based on your symptoms and your exam. Blood and urine tests will help confirm the diagnosis. The diagnostic evaluation should also identify the cause of dehydration. TREATMENT  Treatment of mild or moderate dehydration can often be done at home by increasing the amount of fluids that you drink. It is best to drink small amounts of fluid more often. Drinking too much at one time can make vomiting worse. Refer to the home care instructions below. Severe dehydration needs to be treated at the hospital where you will probably be given intravenous (IV) fluids that contain water and electrolytes. HOME CARE INSTRUCTIONS   Ask your caregiver about specific rehydration instructions.  Drink enough fluids to keep your urine clear or pale yellow.  Drink small amounts frequently if you have nausea and vomiting.  Eat as you normally do.  Avoid:  Foods or drinks high in sugar.  Carbonated  drinks.  Juice.  Extremely hot or cold fluids.  Drinks with caffeine.  Fatty, greasy foods.  Alcohol.  Tobacco.  Overeating.  Gelatin desserts.  Wash your hands well to avoid spreading bacteria and viruses.  Only take over-the-counter or prescription medicines for pain, discomfort, or fever as directed by your caregiver.  Ask your caregiver if you should continue all prescribed and over-the-counter medicines.  Keep all follow-up appointments with your caregiver. SEEK MEDICAL CARE IF:  You have abdominal pain and it increases or stays in one area (localizes).  You have a rash, stiff neck, or severe headache.  You are irritable, sleepy, or difficult to awaken.  You are weak, dizzy, or extremely thirsty. SEEK IMMEDIATE MEDICAL CARE IF:   You are unable to keep fluids down or you get worse despite treatment.  You have frequent episodes of vomiting or diarrhea.  You have blood or green matter (bile) in your vomit.  You have blood in your stool or your stool looks black and tarry.  You have not urinated in 6 to 8 hours, or you have only urinated a small amount of very dark urine.  You have a fever.  You faint. MAKE SURE YOU:   Understand these instructions.  Will watch your condition.  Will get help right away if you are not doing well or get worse. Document Released: 05/27/2005 Document Revised: 08/19/2011 Document Reviewed: 01/14/2011 ExitCare Patient Information 2015 [employer]. This information is not intended to replace advice given to you by your health care provider. Make sure you discuss any questions you have with your health care   provider.  

## 2014-11-20 ENCOUNTER — Other Ambulatory Visit: Payer: Self-pay | Admitting: Oncology

## 2014-11-20 DIAGNOSIS — C482 Malignant neoplasm of peritoneum, unspecified: Secondary | ICD-10-CM

## 2014-11-21 ENCOUNTER — Telehealth: Payer: Self-pay | Admitting: Oncology

## 2014-11-21 ENCOUNTER — Telehealth: Payer: Self-pay | Admitting: *Deleted

## 2014-11-21 ENCOUNTER — Other Ambulatory Visit: Payer: Self-pay | Admitting: Oncology

## 2014-11-21 ENCOUNTER — Ambulatory Visit: Payer: Medicaid Other

## 2014-11-21 NOTE — Telephone Encounter (Signed)
VM message from patient stating that she is cancelling her 9:30am IVF appt scheduled for today. Charge nurse in infusion made aware.

## 2014-11-21 NOTE — Telephone Encounter (Signed)
Injections added per pof and patient will get a new schedule today in chemo

## 2014-11-24 ENCOUNTER — Ambulatory Visit (HOSPITAL_BASED_OUTPATIENT_CLINIC_OR_DEPARTMENT_OTHER): Payer: Medicaid Other | Admitting: Oncology

## 2014-11-24 ENCOUNTER — Other Ambulatory Visit (HOSPITAL_BASED_OUTPATIENT_CLINIC_OR_DEPARTMENT_OTHER): Payer: Medicaid Other

## 2014-11-24 ENCOUNTER — Encounter: Payer: Self-pay | Admitting: Oncology

## 2014-11-24 ENCOUNTER — Ambulatory Visit (HOSPITAL_BASED_OUTPATIENT_CLINIC_OR_DEPARTMENT_OTHER): Payer: Medicaid Other

## 2014-11-24 ENCOUNTER — Telehealth: Payer: Self-pay | Admitting: Oncology

## 2014-11-24 VITALS — BP 133/94 | HR 110 | Temp 98.7°F | Resp 18 | Ht 65.0 in | Wt 199.2 lb

## 2014-11-24 DIAGNOSIS — R599 Enlarged lymph nodes, unspecified: Secondary | ICD-10-CM

## 2014-11-24 DIAGNOSIS — M545 Low back pain: Secondary | ICD-10-CM | POA: Diagnosis not present

## 2014-11-24 DIAGNOSIS — T451X5A Adverse effect of antineoplastic and immunosuppressive drugs, initial encounter: Secondary | ICD-10-CM

## 2014-11-24 DIAGNOSIS — C482 Malignant neoplasm of peritoneum, unspecified: Secondary | ICD-10-CM

## 2014-11-24 DIAGNOSIS — E876 Hypokalemia: Secondary | ICD-10-CM

## 2014-11-24 DIAGNOSIS — R590 Localized enlarged lymph nodes: Secondary | ICD-10-CM | POA: Diagnosis not present

## 2014-11-24 DIAGNOSIS — G620 Drug-induced polyneuropathy: Secondary | ICD-10-CM

## 2014-11-24 DIAGNOSIS — Z809 Family history of malignant neoplasm, unspecified: Secondary | ICD-10-CM | POA: Diagnosis not present

## 2014-11-24 DIAGNOSIS — G622 Polyneuropathy due to other toxic agents: Secondary | ICD-10-CM | POA: Diagnosis not present

## 2014-11-24 DIAGNOSIS — K219 Gastro-esophageal reflux disease without esophagitis: Secondary | ICD-10-CM

## 2014-11-24 DIAGNOSIS — R112 Nausea with vomiting, unspecified: Secondary | ICD-10-CM

## 2014-11-24 DIAGNOSIS — D701 Agranulocytosis secondary to cancer chemotherapy: Secondary | ICD-10-CM

## 2014-11-24 LAB — COMPREHENSIVE METABOLIC PANEL (CC13)
ALK PHOS: 66 U/L (ref 40–150)
ALT: 19 U/L (ref 0–55)
ANION GAP: 13 meq/L — AB (ref 3–11)
AST: 20 U/L (ref 5–34)
Albumin: 3.8 g/dL (ref 3.5–5.0)
BILIRUBIN TOTAL: 0.95 mg/dL (ref 0.20–1.20)
BUN: 17.4 mg/dL (ref 7.0–26.0)
CHLORIDE: 99 meq/L (ref 98–109)
CO2: 25 meq/L (ref 22–29)
CREATININE: 1 mg/dL (ref 0.6–1.1)
Calcium: 9.2 mg/dL (ref 8.4–10.4)
EGFR: 78 mL/min/{1.73_m2} — AB (ref >90)
GLUCOSE: 136 mg/dL (ref 70–140)
Potassium: 3.2 mEq/L — ABNORMAL LOW (ref 3.5–5.1)
SODIUM: 137 meq/L (ref 136–145)
TOTAL PROTEIN: 7.7 g/dL (ref 6.4–8.3)

## 2014-11-24 LAB — CBC WITH DIFFERENTIAL/PLATELET
BASO%: 0.2 % (ref 0.0–2.0)
Basophils Absolute: 0 10*3/uL (ref 0.0–0.1)
EOS%: 0.2 % (ref 0.0–7.0)
Eosinophils Absolute: 0 10*3/uL (ref 0.0–0.5)
HCT: 27.2 % — ABNORMAL LOW (ref 34.8–46.6)
HGB: 9.5 g/dL — ABNORMAL LOW (ref 11.6–15.9)
LYMPH%: 55.3 % — AB (ref 14.0–49.7)
MCH: 32 pg (ref 25.1–34.0)
MCHC: 34.9 g/dL (ref 31.5–36.0)
MCV: 91.6 fL (ref 79.5–101.0)
MONO#: 0.3 10*3/uL (ref 0.1–0.9)
MONO%: 5.5 % (ref 0.0–14.0)
NEUT#: 2 10*3/uL (ref 1.5–6.5)
NEUT%: 38.8 % (ref 38.4–76.8)
PLATELETS: 208 10*3/uL (ref 145–400)
RBC: 2.97 10*6/uL — ABNORMAL LOW (ref 3.70–5.45)
RDW: 17.5 % — ABNORMAL HIGH (ref 11.2–14.5)
WBC: 5.1 10*3/uL (ref 3.9–10.3)
lymph#: 2.8 10*3/uL (ref 0.9–3.3)

## 2014-11-24 LAB — CA 125: CA 125: 18 U/mL (ref <35)

## 2014-11-24 MED ORDER — PANTOPRAZOLE SODIUM 40 MG PO TBEC: 40.0000 mg | DELAYED_RELEASE_TABLET | Freq: Every day | ORAL | Status: DC

## 2014-11-24 MED ORDER — TBO-FILGRASTIM 300 MCG/0.5ML ~~LOC~~ SOSY
300.0000 ug | PREFILLED_SYRINGE | Freq: Once | SUBCUTANEOUS | Status: AC
  Administered 2014-11-24: 300 ug via SUBCUTANEOUS
  Filled 2014-11-24: qty 0.5

## 2014-11-24 NOTE — Progress Notes (Signed)
OFFICE PROGRESS NOTE   November 24, 2014   Physicians:Emma Rossi/ W. 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 She had no reaction after EMEND was not used this cycle.  She had IVF on day 3, then "felt too weak" to come for planned IVF on day 5 (discussed now); nausea was not as severe even without EMEND, vomited only x1. Aches from taxol not severe now. She is to begin granix today x 3 days.   Patient was generally fatigued beginning ~ day 5, mostly in bed for several days, with family available to assist. She is feeling better overall today. She never filled 11-03-14 prescription for potassium. Bowels moved yesterday. She is eating and drinking fluids more easily now. She has slight tingling at metatarsal heads bilaterally and feet "feel cold" not to touch. She denies SOB at rest or chest pain.  Peripheral IV access not easy with cycle 5, however she much prefers to try this again for last planned treatment. We did discuss PICC if necessary.  No PAC Genetics testing normal,  OvaNext panel 09-15-14 CA 125 on 06-30-14 58  ONCOLOGIC HISTORY Patient presented to ED 06-28-14 with fairly acute onset of low back pain, without known trauma, so severe at that time that she was having difficulty standing and walking; blood pressure in ED was 260/148 such that she was admitted for hypertensive urgency. Evaluation included CT CAP 06-28-14 which demonstrated a 4.4 cm right adnexal mass, with stranding in omentum and small fluid in pelvis, as well as mild mediastinal and hilar adenopathy. US showed 4.6 x 5.2 x 3.8 cm apparent complex ovarian mass. CA 125 was 58 on 06-30-14. She was seen in consultation by Dr [name] on 07-08-14. Imaging otherwise showed no aortic aneurysm; MRI of L  spine 06-29-14 found some facet hypertrophy and small right paracentral disc protrusion L5S1. Blood pressure was treated and patient established with [facility], Dr Doreene Burke.  She was stable for surgery by Dr Skeet Latch 07-28-14, which was exploratory laparotomy with BSO, infragastric omentectomy, washings and biopsies and appendectomy. Pathology [phone]) found low grade serous carcinoma thruout. Post operative course was unremarkable. She saw Dr [name] on 08-03-14, with recommendation for 6 cycles of taxol and carboplatin then consideration of Megace maintenance; she will see Dr [name] again after chemo completes. Cycle 1 carbo taxol was 08-25-14, with leukopenia by day 8 such that granix was added x 3 doses. Delayed nausea was a problem with initial cycles, EMEND approved for cycle 3, helpful with nausea but likely allergic reaction to this cycle 4.    Review of systems as above, also: No fever or symptoms of infection. No LE swelling or pain. No bleeding. No abdominal or pelvic pain Remainder of 10 point Review of Systems negative.  Objective:  Vital signs in last 24 hours:  BP 133/94 mmHg  Pulse 110  Temp(Src) 98.7 F (37.1 C) (Oral)  Resp 18  Ht 5' 5" (1.651 m)  Wt 199 lb 3.2 oz (90.357 kg)  BMI 33.15 kg/m2 Weight stable Alert, oriented and appropriate. Ambulatory without difficulty. Very pleasant as always, NAD Alopecia  HEENT:PERRL, sclerae not icteric. Oral mucosa moist without lesions, posterior pharynx clear.  Neck supple. No JVD.  Lymphatics:no cervical,supraclavicular or inguinal adenopathy Resp: clear to auscultation bilaterally and normal  percussion bilaterally Cardio: regular rate and rhythm. No gallop. GI: abdomen obese, soft, nontender, not distended, no mass or organomegaly. Normally active bowel sounds. Surgical incision not remarkable. Musculoskeletal/ Extremities: without pitting edema, cords, tenderness Neuro: no significant peripheral neuropathy.  Otherwise nonfocal. Psych appropriate mood and affect.  Skin without rash, ecchymosis, petechiae. Sites of IV attempts bilateral forearms from last treatment no cords or erythema.   Lab Results:  Results for orders placed or performed in visit on 11/24/14  CBC with Differential  Result Value Ref Range   WBC 5.1 3.9 - 10.3 10e3/uL   NEUT# 2.0 1.5 - 6.5 10e3/uL   HGB 9.5 (L) 11.6 - 15.9 g/dL   HCT 27.2 (L) 34.8 - 46.6 %   Platelets 208 145 - 400 10e3/uL   MCV 91.6 79.5 - 101.0 fL   MCH 32.0 25.1 - 34.0 pg   MCHC 34.9 31.5 - 36.0 g/dL   RBC 2.97 (L) 3.70 - 5.45 10e6/uL   RDW 17.5 (H) 11.2 - 14.5 %   lymph# 2.8 0.9 - 3.3 10e3/uL   MONO# 0.3 0.1 - 0.9 10e3/uL   Eosinophils Absolute 0.0 0.0 - 0.5 10e3/uL   Basophils Absolute 0.0 0.0 - 0.1 10e3/uL   NEUT% 38.8 38.4 - 76.8 %   LYMPH% 55.3 (H) 14.0 - 49.7 %   MONO% 5.5 0.0 - 14.0 %   EOS% 0.2 0.0 - 7.0 %   BASO% 0.2 0.0 - 2.0 %  Comprehensive metabolic panel (Cmet) - [facility]  Result Value Ref Range   Sodium 137 136 - 145 mEq/L   Potassium 3.2 (L) 3.5 - 5.1 mEq/L   Chloride 99 98 - 109 mEq/L   CO2 25 22 - 29 mEq/L   Glucose 136 70 - 140 mg/dl   BUN 17.4 7.0 - 26.0 mg/dL   Creatinine 1.0 0.6 - 1.1 mg/dL   Total Bilirubin 0.95 0.20 - 1.20 mg/dL   Alkaline Phosphatase 66 40 - 150 U/L   AST 20 5 - 34 U/L   ALT 19 0 - 55 U/L   Total Protein 7.7 6.4 - 8.3 g/dL   Albumin 3.8 3.5 - 5.0 g/dL   Calcium 9.2 8.4 - 10.4 mg/dL   Anion Gap 13 (H) 3 - 11 mEq/L   EGFR 78 (L) >90 ml/min/1.73 m2   CA 125 available after visit 18, this having been 58 at presentation and 16 on 09-22-14  Studies/Results:  No results found. Will get CT CAP after cycle 6 chemo/ shortly prior to re-evaluation by gyn oncology  Medications: I have reviewed the patient's current medications. Beginning granix today.  DISCUSSION: potassium still a little low, tho better than 5-26 even without the oral supplements. She prefers to increase in diet, written list of high  potassium foods given now. Discussed importance of IVF after chemo especially if she is feeling very weak on the day that these are scheduled. CBC today discussed  Assessment/Plan:  1. 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 She will NOT have further EMEND. She will have IVF on days 3 and 5, and granix x 3 doses after chemo. I will see her with labs ~ 7-14. She will have CT CAP prior to follow up visit with Dr Skeet Latch. 2.hypokalemia as above. 3.Hypertensive urgency at presentation, BP variable but in better range now especially on home monitoring, followed by  PCP with [facility] 4.overdue mammograms, mammogram scholarship 5.family history cancers also, genetics testing in process, delayed by Medicaid pending. Message sent now to financial staff and genetics counselor, as EMR now lists Medicaid as insurance, so may be able to proceed with testing. 6.intermittent low back pain: does not seem related to the cancer diagnosis degenerative change seen on MRI L spine.  7.social concerns: Medicaid approved now 8.Mild mediastinal and hilar adenopathy of uncertain significance, seen on CT 06-28-14. 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 Post partial thyroidectomy  11. DIfficult IV access noted in chemo orders, for nursing attention.  Chemo orders confirmed, IVF and granix also. All questions addressed. Patient knows to call if needed prior to next scheduled visits. TIme spent 25 min ncluding >50% counseling and coordination of care.   LIVESAY,LENNIS P, MD   11/24/2014, 11:10 AM

## 2014-11-24 NOTE — Telephone Encounter (Signed)
Gave avs & calendar for June thru August. Also gave CT contrast

## 2014-11-25 ENCOUNTER — Ambulatory Visit (HOSPITAL_BASED_OUTPATIENT_CLINIC_OR_DEPARTMENT_OTHER): Payer: Medicaid Other

## 2014-11-25 VITALS — BP 133/76 | HR 82 | Temp 97.9°F

## 2014-11-25 DIAGNOSIS — C482 Malignant neoplasm of peritoneum, unspecified: Secondary | ICD-10-CM

## 2014-11-25 DIAGNOSIS — Z5189 Encounter for other specified aftercare: Secondary | ICD-10-CM | POA: Diagnosis not present

## 2014-11-25 MED ORDER — TBO-FILGRASTIM 300 MCG/0.5ML ~~LOC~~ SOSY
300.0000 ug | PREFILLED_SYRINGE | Freq: Once | SUBCUTANEOUS | Status: AC
  Administered 2014-11-25: 300 ug via SUBCUTANEOUS
  Filled 2014-11-25: qty 0.5

## 2014-11-26 ENCOUNTER — Ambulatory Visit (HOSPITAL_BASED_OUTPATIENT_CLINIC_OR_DEPARTMENT_OTHER): Payer: Medicaid Other

## 2014-11-26 VITALS — BP 139/80 | HR 73 | Temp 96.8°F | Resp 16

## 2014-11-26 DIAGNOSIS — Z5189 Encounter for other specified aftercare: Secondary | ICD-10-CM | POA: Diagnosis not present

## 2014-11-26 DIAGNOSIS — C482 Malignant neoplasm of peritoneum, unspecified: Secondary | ICD-10-CM | POA: Diagnosis present

## 2014-11-26 MED ORDER — TBO-FILGRASTIM 300 MCG/0.5ML ~~LOC~~ SOSY
300.0000 ug | PREFILLED_SYRINGE | Freq: Once | SUBCUTANEOUS | Status: AC
  Administered 2014-11-26: 300 ug via SUBCUTANEOUS

## 2014-12-08 ENCOUNTER — Other Ambulatory Visit: Payer: Self-pay | Admitting: Oncology

## 2014-12-08 ENCOUNTER — Other Ambulatory Visit: Payer: Self-pay | Admitting: Internal Medicine

## 2014-12-08 ENCOUNTER — Ambulatory Visit (HOSPITAL_BASED_OUTPATIENT_CLINIC_OR_DEPARTMENT_OTHER): Payer: Medicaid Other

## 2014-12-08 ENCOUNTER — Other Ambulatory Visit (HOSPITAL_BASED_OUTPATIENT_CLINIC_OR_DEPARTMENT_OTHER): Payer: Medicaid Other

## 2014-12-08 VITALS — BP 146/87 | HR 100 | Temp 98.2°F | Resp 18

## 2014-12-08 DIAGNOSIS — C482 Malignant neoplasm of peritoneum, unspecified: Secondary | ICD-10-CM

## 2014-12-08 DIAGNOSIS — Z5111 Encounter for antineoplastic chemotherapy: Secondary | ICD-10-CM | POA: Diagnosis present

## 2014-12-08 LAB — CBC WITH DIFFERENTIAL/PLATELET
BASO%: 0 % (ref 0.0–2.0)
Basophils Absolute: 0 10*3/uL (ref 0.0–0.1)
EOS%: 0 % (ref 0.0–7.0)
Eosinophils Absolute: 0 10*3/uL (ref 0.0–0.5)
HCT: 25.5 % — ABNORMAL LOW (ref 34.8–46.6)
HGB: 8.7 g/dL — ABNORMAL LOW (ref 11.6–15.9)
LYMPH%: 21.8 % (ref 14.0–49.7)
MCH: 33.7 pg (ref 25.1–34.0)
MCHC: 34.1 g/dL (ref 31.5–36.0)
MCV: 98.8 fL (ref 79.5–101.0)
MONO#: 0.1 10*3/uL (ref 0.1–0.9)
MONO%: 1.1 % (ref 0.0–14.0)
NEUT%: 77.1 % — ABNORMAL HIGH (ref 38.4–76.8)
NEUTROS ABS: 3.6 10*3/uL (ref 1.5–6.5)
PLATELETS: 95 10*3/uL — AB (ref 145–400)
RBC: 2.58 10*6/uL — AB (ref 3.70–5.45)
RDW: 19.5 % — ABNORMAL HIGH (ref 11.2–14.5)
WBC: 4.6 10*3/uL (ref 3.9–10.3)
lymph#: 1 10*3/uL (ref 0.9–3.3)

## 2014-12-08 LAB — COMPREHENSIVE METABOLIC PANEL (CC13)
ALT: 18 U/L (ref 0–55)
ANION GAP: 17 meq/L — AB (ref 3–11)
AST: 19 U/L (ref 5–34)
Albumin: 3.9 g/dL (ref 3.5–5.0)
Alkaline Phosphatase: 78 U/L (ref 40–150)
BILIRUBIN TOTAL: 0.81 mg/dL (ref 0.20–1.20)
BUN: 15.9 mg/dL (ref 7.0–26.0)
CALCIUM: 9.5 mg/dL (ref 8.4–10.4)
CHLORIDE: 101 meq/L (ref 98–109)
CO2: 19 mEq/L — ABNORMAL LOW (ref 22–29)
CREATININE: 1.1 mg/dL (ref 0.6–1.1)
EGFR: 71 mL/min/{1.73_m2} — ABNORMAL LOW (ref >90)
GLUCOSE: 271 mg/dL — AB (ref 70–140)
POTASSIUM: 3.9 meq/L (ref 3.5–5.1)
SODIUM: 137 meq/L (ref 136–145)
Total Protein: 8.3 g/dL (ref 6.4–8.3)

## 2014-12-08 MED ORDER — PACLITAXEL CHEMO INJECTION 300 MG/50ML
135.0000 mg/m2 | Freq: Once | INTRAVENOUS | Status: AC
  Administered 2014-12-08: 282 mg via INTRAVENOUS
  Filled 2014-12-08: qty 47

## 2014-12-08 MED ORDER — FAMOTIDINE IN NACL 20-0.9 MG/50ML-% IV SOLN
INTRAVENOUS | Status: AC
  Filled 2014-12-08: qty 50

## 2014-12-08 MED ORDER — FAMOTIDINE IN NACL 20-0.9 MG/50ML-% IV SOLN
20.0000 mg | Freq: Once | INTRAVENOUS | Status: AC
Start: 2014-12-08 — End: 2014-12-08
  Administered 2014-12-08: 20 mg via INTRAVENOUS

## 2014-12-08 MED ORDER — SODIUM CHLORIDE 0.9 % IV SOLN
750.0000 mg | Freq: Once | INTRAVENOUS | Status: DC
  Filled 2014-12-08: qty 75

## 2014-12-08 MED ORDER — SODIUM CHLORIDE 0.9 % IV SOLN
Freq: Once | INTRAVENOUS | Status: AC
  Administered 2014-12-08: 10:00:00 via INTRAVENOUS

## 2014-12-08 MED ORDER — DIPHENHYDRAMINE HCL 50 MG/ML IJ SOLN
INTRAMUSCULAR | Status: AC
  Filled 2014-12-08: qty 1

## 2014-12-08 MED ORDER — SODIUM CHLORIDE 0.9 % IV SOLN
Freq: Once | INTRAVENOUS | Status: AC
  Administered 2014-12-08: 11:00:00 via INTRAVENOUS
  Filled 2014-12-08: qty 8

## 2014-12-08 MED ORDER — DIPHENHYDRAMINE HCL 50 MG/ML IJ SOLN
25.0000 mg | Freq: Once | INTRAMUSCULAR | Status: AC
  Administered 2014-12-08: 25 mg via INTRAVENOUS

## 2014-12-08 MED ORDER — SODIUM CHLORIDE 0.9 % IV SOLN
480.0000 mg | Freq: Once | INTRAVENOUS | Status: AC
  Administered 2014-12-08: 480 mg via INTRAVENOUS
  Filled 2014-12-08: qty 48

## 2014-12-08 NOTE — Progress Notes (Signed)
Tammiie, RN reviewed pt labs with MD ok to treat, carbo dose reduced platelets 95

## 2014-12-08 NOTE — Patient Instructions (Signed)
[facility] Discharge Instructions for Patients Receiving Chemotherapy  Today you received the following chemotherapy agents Taxol/Carboplatin  To help prevent nausea and vomiting after your treatment, we encourage you to take your nausea medication    If you develop nausea and vomiting that is not controlled by your nausea medication, call the clinic.   BELOW ARE SYMPTOMS THAT SHOULD BE REPORTED IMMEDIATELY:  *FEVER GREATER THAN 100.5 F  *CHILLS WITH OR WITHOUT FEVER  NAUSEA AND VOMITING THAT IS NOT CONTROLLED WITH YOUR NAUSEA MEDICATION  *UNUSUAL SHORTNESS OF BREATH  *UNUSUAL BRUISING OR BLEEDING  TENDERNESS IN MOUTH AND THROAT WITH OR WITHOUT PRESENCE OF ULCERS  *URINARY PROBLEMS  *BOWEL PROBLEMS  UNUSUAL RASH Items with * indicate a potential emergency and should be followed up as soon as possible.  Feel free to call the clinic you have any questions or concerns. The clinic phone number is [phone].  Please show the CHEMO ALERT CARD at check-in to the Emergency Department and triage nurse.   

## 2014-12-09 ENCOUNTER — Telehealth: Payer: Self-pay | Admitting: Oncology

## 2014-12-09 ENCOUNTER — Other Ambulatory Visit: Payer: Self-pay | Admitting: Oncology

## 2014-12-09 ENCOUNTER — Ambulatory Visit (HOSPITAL_BASED_OUTPATIENT_CLINIC_OR_DEPARTMENT_OTHER): Payer: Medicaid Other

## 2014-12-09 ENCOUNTER — Encounter: Payer: Self-pay | Admitting: Oncology

## 2014-12-09 ENCOUNTER — Other Ambulatory Visit: Payer: Self-pay

## 2014-12-09 VITALS — BP 151/86 | HR 75 | Temp 97.6°F | Resp 18

## 2014-12-09 DIAGNOSIS — Z5189 Encounter for other specified aftercare: Secondary | ICD-10-CM | POA: Diagnosis not present

## 2014-12-09 DIAGNOSIS — C482 Malignant neoplasm of peritoneum, unspecified: Secondary | ICD-10-CM | POA: Diagnosis not present

## 2014-12-09 MED ORDER — METOPROLOL TARTRATE 25 MG PO TABS: ORAL_TABLET | ORAL | Status: DC

## 2014-12-09 MED ORDER — SODIUM CHLORIDE 0.45 % IV SOLN
INTRAVENOUS | Status: DC
  Administered 2014-12-09: 09:00:00 via INTRAVENOUS
  Filled 2014-12-09: qty 1000

## 2014-12-09 NOTE — Telephone Encounter (Signed)
Lab added to 07/07 before injection per 07/01 POF, edit note for pt to get updated schedule today at his chemo visit.... KJ

## 2014-12-09 NOTE — Patient Instructions (Signed)
Dehydration, Adult Dehydration is when you lose more fluids from the body than you take in. Vital organs like the kidneys, brain, and heart cannot function without a proper amount of fluids and salt. Any loss of fluids from the body can cause dehydration.  CAUSES   Vomiting.  Diarrhea.  Excessive sweating.  Excessive urine output.  Fever. SYMPTOMS  Mild dehydration  Thirst.  Dry lips.  Slightly dry mouth. Moderate dehydration  Very dry mouth.  Sunken eyes.  Skin does not bounce back quickly when lightly pinched and released.  Dark urine and decreased urine production.  Decreased tear production.  Headache. Severe dehydration  Very dry mouth.  Extreme thirst.  Rapid, weak pulse (more than 100 beats per minute at rest).  Cold hands and feet.  Not able to sweat in spite of heat and temperature.  Rapid breathing.  Blue lips.  Confusion and lethargy.  Difficulty being awakened.  Minimal urine production.  No tears. DIAGNOSIS  Your caregiver will diagnose dehydration based on your symptoms and your exam. Blood and urine tests will help confirm the diagnosis. The diagnostic evaluation should also identify the cause of dehydration. TREATMENT  Treatment of mild or moderate dehydration can often be done at home by increasing the amount of fluids that you drink. It is best to drink small amounts of fluid more often. Drinking too much at one time can make vomiting worse. Refer to the home care instructions below. Severe dehydration needs to be treated at the hospital where you will probably be given intravenous (IV) fluids that contain water and electrolytes. HOME CARE INSTRUCTIONS   Ask your caregiver about specific rehydration instructions.  Drink enough fluids to keep your urine clear or pale yellow.  Drink small amounts frequently if you have nausea and vomiting.  Eat as you normally do.  Avoid:  Foods or drinks high in sugar.  Carbonated  drinks.  Juice.  Extremely hot or cold fluids.  Drinks with caffeine.  Fatty, greasy foods.  Alcohol.  Tobacco.  Overeating.  Gelatin desserts.  Wash your hands well to avoid spreading bacteria and viruses.  Only take over-the-counter or prescription medicines for pain, discomfort, or fever as directed by your caregiver.  Ask your caregiver if you should continue all prescribed and over-the-counter medicines.  Keep all follow-up appointments with your caregiver. SEEK MEDICAL CARE IF:  You have abdominal pain and it increases or stays in one area (localizes).  You have a rash, stiff neck, or severe headache.  You are irritable, sleepy, or difficult to awaken.  You are weak, dizzy, or extremely thirsty. SEEK IMMEDIATE MEDICAL CARE IF:   You are unable to keep fluids down or you get worse despite treatment.  You have frequent episodes of vomiting or diarrhea.  You have blood or green matter (bile) in your vomit.  You have blood in your stool or your stool looks black and tarry.  You have not urinated in 6 to 8 hours, or you have only urinated a small amount of very dark urine.  You have a fever.  You faint. MAKE SURE YOU:   Understand these instructions.  Will watch your condition.  Will get help right away if you are not doing well or get worse. Document Released: 05/27/2005 Document Revised: 08/19/2011 Document Reviewed: 01/14/2011 ExitCare Patient Information 2015 [employer]. This information is not intended to replace advice given to you by your health care provider. Make sure you discuss any questions you have with your health care   provider.  

## 2014-12-09 NOTE — Progress Notes (Signed)
Medical Oncology  CBC 12-08-14 day of cycle 6 chemo had WBC 4.6, ANC 3.6, Hgb 8.7 and plt 95k  Chemo given with carbo dose reduced to AUC =4 per MD order then.  Will repeat CBC with injection on 12-15-14.  Godfrey Pick, MD

## 2014-12-09 NOTE — Progress Notes (Signed)
Patient states she does not need nausea medication today.  Patient knows to let RN know if she decides she needs her nausea medication before she leaves.

## 2014-12-09 NOTE — Progress Notes (Signed)
Medical Oncology  CBC day of #6 carbo taxol 12-08-14 had WBC 4.6, ANC 3.6, Hgb 8.7, plt 95k Chemo given with carbo dose reduced to AUC = 4 Order for CBC with injection 12-15-14.  Godfrey Pick, MD

## 2014-12-10 ENCOUNTER — Ambulatory Visit (HOSPITAL_BASED_OUTPATIENT_CLINIC_OR_DEPARTMENT_OTHER): Payer: Medicaid Other

## 2014-12-10 VITALS — BP 132/84 | HR 69 | Temp 97.9°F | Resp 18

## 2014-12-10 DIAGNOSIS — C482 Malignant neoplasm of peritoneum, unspecified: Secondary | ICD-10-CM | POA: Diagnosis present

## 2014-12-10 MED ORDER — SODIUM CHLORIDE 0.45 % IV SOLN
INTRAVENOUS | Status: DC
  Administered 2014-12-10: 09:00:00 via INTRAVENOUS

## 2014-12-10 NOTE — Progress Notes (Signed)
Patient does not complain of nausea. Nausea medication on hold. Patient instructed to inform nursing if her status changes and she needs nausea medications.

## 2014-12-10 NOTE — Patient Instructions (Signed)
Dehydration, Adult Dehydration is when you lose more fluids from the body than you take in. Vital organs like the kidneys, brain, and heart cannot function without a proper amount of fluids and salt. Any loss of fluids from the body can cause dehydration.  CAUSES   Vomiting.  Diarrhea.  Excessive sweating.  Excessive urine output.  Fever. SYMPTOMS  Mild dehydration  Thirst.  Dry lips.  Slightly dry mouth. Moderate dehydration  Very dry mouth.  Sunken eyes.  Skin does not bounce back quickly when lightly pinched and released.  Dark urine and decreased urine production.  Decreased tear production.  Headache. Severe dehydration  Very dry mouth.  Extreme thirst.  Rapid, weak pulse (more than 100 beats per minute at rest).  Cold hands and feet.  Not able to sweat in spite of heat and temperature.  Rapid breathing.  Blue lips.  Confusion and lethargy.  Difficulty being awakened.  Minimal urine production.  No tears. DIAGNOSIS  Your caregiver will diagnose dehydration based on your symptoms and your exam. Blood and urine tests will help confirm the diagnosis. The diagnostic evaluation should also identify the cause of dehydration. TREATMENT  Treatment of mild or moderate dehydration can often be done at home by increasing the amount of fluids that you drink. It is best to drink small amounts of fluid more often. Drinking too much at one time can make vomiting worse. Refer to the home care instructions below. Severe dehydration needs to be treated at the hospital where you will probably be given intravenous (IV) fluids that contain water and electrolytes. HOME CARE INSTRUCTIONS   Ask your caregiver about specific rehydration instructions.  Drink enough fluids to keep your urine clear or pale yellow.  Drink small amounts frequently if you have nausea and vomiting.  Eat as you normally do.  Avoid:  Foods or drinks high in sugar.  Carbonated  drinks.  Juice.  Extremely hot or cold fluids.  Drinks with caffeine.  Fatty, greasy foods.  Alcohol.  Tobacco.  Overeating.  Gelatin desserts.  Wash your hands well to avoid spreading bacteria and viruses.  Only take over-the-counter or prescription medicines for pain, discomfort, or fever as directed by your caregiver.  Ask your caregiver if you should continue all prescribed and over-the-counter medicines.  Keep all follow-up appointments with your caregiver. SEEK MEDICAL CARE IF:  You have abdominal pain and it increases or stays in one area (localizes).  You have a rash, stiff neck, or severe headache.  You are irritable, sleepy, or difficult to awaken.  You are weak, dizzy, or extremely thirsty. SEEK IMMEDIATE MEDICAL CARE IF:   You are unable to keep fluids down or you get worse despite treatment.  You have frequent episodes of vomiting or diarrhea.  You have blood or green matter (bile) in your vomit.  You have blood in your stool or your stool looks black and tarry.  You have not urinated in 6 to 8 hours, or you have only urinated a small amount of very dark urine.  You have a fever.  You faint. MAKE SURE YOU:   Understand these instructions.  Will watch your condition.  Will get help right away if you are not doing well or get worse. Document Released: 05/27/2005 Document Revised: 08/19/2011 Document Reviewed: 01/14/2011 ExitCare Patient Information 2015 [employer]. This information is not intended to replace advice given to you by your health care provider. Make sure you discuss any questions you have with your health care   provider.  

## 2014-12-13 ENCOUNTER — Ambulatory Visit: Payer: Medicaid Other

## 2014-12-15 ENCOUNTER — Other Ambulatory Visit (HOSPITAL_BASED_OUTPATIENT_CLINIC_OR_DEPARTMENT_OTHER): Payer: Medicaid Other

## 2014-12-15 ENCOUNTER — Ambulatory Visit (HOSPITAL_BASED_OUTPATIENT_CLINIC_OR_DEPARTMENT_OTHER): Payer: Medicaid Other

## 2014-12-15 VITALS — BP 130/77 | HR 105 | Temp 98.6°F

## 2014-12-15 DIAGNOSIS — C482 Malignant neoplasm of peritoneum, unspecified: Secondary | ICD-10-CM

## 2014-12-15 DIAGNOSIS — Z5189 Encounter for other specified aftercare: Secondary | ICD-10-CM | POA: Diagnosis present

## 2014-12-15 LAB — CBC WITH DIFFERENTIAL/PLATELET
BASO%: 0.2 % (ref 0.0–2.0)
Basophils Absolute: 0 10*3/uL (ref 0.0–0.1)
EOS%: 0.3 % (ref 0.0–7.0)
Eosinophils Absolute: 0 10*3/uL (ref 0.0–0.5)
HCT: 23.2 % — ABNORMAL LOW (ref 34.8–46.6)
HGB: 8.1 g/dL — ABNORMAL LOW (ref 11.6–15.9)
LYMPH#: 2.4 10*3/uL (ref 0.9–3.3)
LYMPH%: 57.8 % — ABNORMAL HIGH (ref 14.0–49.7)
MCH: 35.1 pg — AB (ref 25.1–34.0)
MCHC: 34.7 g/dL (ref 31.5–36.0)
MCV: 101 fL (ref 79.5–101.0)
MONO#: 0.3 10*3/uL (ref 0.1–0.9)
MONO%: 7.7 % (ref 0.0–14.0)
NEUT#: 1.4 10*3/uL — ABNORMAL LOW (ref 1.5–6.5)
NEUT%: 34 % — ABNORMAL LOW (ref 38.4–76.8)
Platelets: 241 10*3/uL (ref 145–400)
RBC: 2.3 10*6/uL — AB (ref 3.70–5.45)
RDW: 21.2 % — ABNORMAL HIGH (ref 11.2–14.5)
WBC: 4.1 10*3/uL (ref 3.9–10.3)

## 2014-12-15 MED ORDER — TBO-FILGRASTIM 300 MCG/0.5ML ~~LOC~~ SOSY
300.0000 ug | PREFILLED_SYRINGE | Freq: Once | SUBCUTANEOUS | Status: AC
  Administered 2014-12-15: 300 ug via SUBCUTANEOUS
  Filled 2014-12-15: qty 0.5

## 2014-12-16 ENCOUNTER — Ambulatory Visit (HOSPITAL_BASED_OUTPATIENT_CLINIC_OR_DEPARTMENT_OTHER): Payer: Medicaid Other

## 2014-12-16 ENCOUNTER — Other Ambulatory Visit: Payer: Self-pay | Admitting: Oncology

## 2014-12-16 ENCOUNTER — Telehealth: Payer: Self-pay

## 2014-12-16 VITALS — BP 136/81 | HR 96 | Temp 97.8°F

## 2014-12-16 DIAGNOSIS — C482 Malignant neoplasm of peritoneum, unspecified: Secondary | ICD-10-CM

## 2014-12-16 DIAGNOSIS — Z5189 Encounter for other specified aftercare: Secondary | ICD-10-CM | POA: Diagnosis present

## 2014-12-16 MED ORDER — TBO-FILGRASTIM 300 MCG/0.5ML ~~LOC~~ SOSY
300.0000 ug | PREFILLED_SYRINGE | Freq: Once | SUBCUTANEOUS | Status: AC
  Administered 2014-12-16: 300 ug via SUBCUTANEOUS
  Filled 2014-12-16: qty 0.5

## 2014-12-16 NOTE — Telephone Encounter (Signed)
-----   Message from Gordy Levan, MD sent at 12/09/2014  8:15 AM EDT ----- For CBC 12-15-14. Be sure Lennis knows results if counts low Plt 95K day of chemo 12-08-14, carbo dose reduced / chemo given.  thanks

## 2014-12-16 NOTE — Telephone Encounter (Signed)
Spoke with Sarah [name].  her Hgb was  8.1 yesterday.  She is tired but not SOB at rest or with exertion.  Told her to call the [facility] if she develops these symptoms prior to her appointment on 12-22-14. She would need another cbc/diff and possible blood transfusion.  Ms. Urquilla verbalized understanding.

## 2014-12-17 ENCOUNTER — Ambulatory Visit (HOSPITAL_BASED_OUTPATIENT_CLINIC_OR_DEPARTMENT_OTHER): Payer: Medicaid Other

## 2014-12-17 DIAGNOSIS — C482 Malignant neoplasm of peritoneum, unspecified: Secondary | ICD-10-CM

## 2014-12-17 DIAGNOSIS — Z5189 Encounter for other specified aftercare: Secondary | ICD-10-CM | POA: Diagnosis present

## 2014-12-17 MED ORDER — TBO-FILGRASTIM 300 MCG/0.5ML ~~LOC~~ SOSY
300.0000 ug | PREFILLED_SYRINGE | Freq: Once | SUBCUTANEOUS | Status: AC
  Administered 2014-12-17: 300 ug via SUBCUTANEOUS

## 2014-12-17 NOTE — Patient Instructions (Signed)

## 2014-12-21 ENCOUNTER — Other Ambulatory Visit: Payer: Self-pay | Admitting: Oncology

## 2014-12-21 DIAGNOSIS — C482 Malignant neoplasm of peritoneum, unspecified: Secondary | ICD-10-CM

## 2014-12-22 ENCOUNTER — Encounter: Payer: Self-pay | Admitting: Oncology

## 2014-12-22 ENCOUNTER — Telehealth: Payer: Self-pay | Admitting: Oncology

## 2014-12-22 ENCOUNTER — Other Ambulatory Visit (HOSPITAL_BASED_OUTPATIENT_CLINIC_OR_DEPARTMENT_OTHER): Payer: Medicaid Other

## 2014-12-22 ENCOUNTER — Ambulatory Visit (HOSPITAL_BASED_OUTPATIENT_CLINIC_OR_DEPARTMENT_OTHER): Payer: Medicaid Other | Admitting: Oncology

## 2014-12-22 VITALS — BP 115/72 | HR 87 | Temp 98.7°F | Resp 18 | Ht 65.0 in | Wt 205.9 lb

## 2014-12-22 DIAGNOSIS — D6481 Anemia due to antineoplastic chemotherapy: Secondary | ICD-10-CM | POA: Diagnosis not present

## 2014-12-22 DIAGNOSIS — T451X5A Adverse effect of antineoplastic and immunosuppressive drugs, initial encounter: Secondary | ICD-10-CM

## 2014-12-22 DIAGNOSIS — R599 Enlarged lymph nodes, unspecified: Secondary | ICD-10-CM

## 2014-12-22 DIAGNOSIS — E669 Obesity, unspecified: Secondary | ICD-10-CM

## 2014-12-22 DIAGNOSIS — M545 Low back pain: Secondary | ICD-10-CM

## 2014-12-22 DIAGNOSIS — C482 Malignant neoplasm of peritoneum, unspecified: Secondary | ICD-10-CM

## 2014-12-22 DIAGNOSIS — G622 Polyneuropathy due to other toxic agents: Secondary | ICD-10-CM

## 2014-12-22 DIAGNOSIS — R112 Nausea with vomiting, unspecified: Secondary | ICD-10-CM

## 2014-12-22 DIAGNOSIS — D509 Iron deficiency anemia, unspecified: Secondary | ICD-10-CM

## 2014-12-22 DIAGNOSIS — C569 Malignant neoplasm of unspecified ovary: Secondary | ICD-10-CM

## 2014-12-22 DIAGNOSIS — D701 Agranulocytosis secondary to cancer chemotherapy: Secondary | ICD-10-CM

## 2014-12-22 DIAGNOSIS — G620 Drug-induced polyneuropathy: Secondary | ICD-10-CM

## 2014-12-22 LAB — CBC WITH DIFFERENTIAL/PLATELET
BASO%: 0.2 % (ref 0.0–2.0)
Basophils Absolute: 0 10*3/uL (ref 0.0–0.1)
EOS ABS: 0.1 10*3/uL (ref 0.0–0.5)
EOS%: 1 % (ref 0.0–7.0)
HCT: 24.4 % — ABNORMAL LOW (ref 34.8–46.6)
HEMOGLOBIN: 8.1 g/dL — AB (ref 11.6–15.9)
LYMPH%: 23.7 % (ref 14.0–49.7)
MCH: 34.5 pg — ABNORMAL HIGH (ref 25.1–34.0)
MCHC: 33.2 g/dL (ref 31.5–36.0)
MCV: 103.8 fL — ABNORMAL HIGH (ref 79.5–101.0)
MONO#: 2.2 10*3/uL — ABNORMAL HIGH (ref 0.1–0.9)
MONO%: 17.3 % — ABNORMAL HIGH (ref 0.0–14.0)
NEUT%: 57.8 % (ref 38.4–76.8)
NEUTROS ABS: 7.2 10*3/uL — AB (ref 1.5–6.5)
Platelets: 154 10*3/uL (ref 145–400)
RBC: 2.35 10*6/uL — ABNORMAL LOW (ref 3.70–5.45)
RDW: 19.1 % — ABNORMAL HIGH (ref 11.2–14.5)
WBC: 12.4 10*3/uL — ABNORMAL HIGH (ref 3.9–10.3)
lymph#: 2.9 10*3/uL (ref 0.9–3.3)

## 2014-12-22 LAB — COMPREHENSIVE METABOLIC PANEL (CC13)
ALT: 12 U/L (ref 0–55)
ANION GAP: 15 meq/L — AB (ref 3–11)
AST: 20 U/L (ref 5–34)
Albumin: 3.7 g/dL (ref 3.5–5.0)
Alkaline Phosphatase: 83 U/L (ref 40–150)
BILIRUBIN TOTAL: 0.56 mg/dL (ref 0.20–1.20)
BUN: 15.9 mg/dL (ref 7.0–26.0)
CALCIUM: 8.9 mg/dL (ref 8.4–10.4)
CO2: 26 mEq/L (ref 22–29)
Chloride: 100 mEq/L (ref 98–109)
Creatinine: 1 mg/dL (ref 0.6–1.1)
EGFR: 78 mL/min/{1.73_m2} — AB (ref >90)
Glucose: 111 mg/dl (ref 70–140)
Potassium: 3.3 mEq/L — ABNORMAL LOW (ref 3.5–5.1)
Sodium: 141 mEq/L (ref 136–145)
TOTAL PROTEIN: 7.6 g/dL (ref 6.4–8.3)

## 2014-12-22 MED ORDER — FERROUS FUMARATE 325 (106 FE) MG PO TABS: 1.0000 | ORAL_TABLET | Freq: Every day | ORAL | Status: DC

## 2014-12-22 NOTE — Telephone Encounter (Signed)
Gave avs & calendar for August/September °

## 2014-12-22 NOTE — Progress Notes (Signed)
OFFICE PROGRESS NOTE   December 22, 2014   Physicians:Emma Rossi/ W. 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 Plan is for restaging CT CAP prior to seeing Dr [name] in August.  May be consideration for maintenance Megace given responsiveness/control of some low grade tumors with hormonal therapy.   If she does NOT take Megace and if in CR by restaging, I believe that she would be eligible for the GOG 0225 diet and exercise study; I did not discuss this study with her now.  Patient remains fatigued, tho a little better now than just after treatment. She had no nausea with addition of IVF following treatment, and bowels have been moving regularly. Feet are more painful, especially when cold, and also numb; she has minimal peripheral neuropathy in fingers. She denies bleeding. She has no SOB at rest, no chest pain, no abdominal or pelvic pain.   No PAC Genetics testing normal by OvaNext panel 09-15-14 Pre-op CA 125  (06-30-14) 58  ONCOLOGIC HISTORY Patient presented to ED 06-28-14 with fairly acute onset of low back pain, without known trauma, so severe at that time that she was having difficulty standing and walking; blood pressure in ED was 260/148 such that she was admitted for hypertensive urgency. Evaluation included CT CAP 06-28-14 which demonstrated a 4.4 cm right adnexal mass, with stranding in omentum and small fluid in pelvis, as well as mild mediastinal and hilar adenopathy. US showed 4.6 x 5.2 x 3.8 cm apparent complex ovarian mass. CA 125 was 58 on 06-30-14. She was seen in consultation by Dr [name] on 07-08-14. Imaging otherwise showed no aortic aneurysm; MRI of L spine 06-29-14 found some facet hypertrophy and small right paracentral disc protrusion L5S1. Blood pressure  was treated and patient established with [facility], Dr Doreene Burke.  She was stable for surgery by Dr Skeet Latch 07-28-14, which was exploratory laparotomy with BSO, infragastric omentectomy, washings and biopsies and appendectomy. Pathology [phone]) found low grade serous carcinoma thruout. Post operative course was unremarkable. She saw Dr [name] on 08-03-14, with recommendation for 6 cycles of taxol and carboplatin then consideration of Megace maintenance; she will see Dr [name] again after chemo completes. Cycle 1 carbo taxol was 08-25-14, with leukopenia by day 8 such that granix was added x 3 doses. Delayed nausea was a problem with initial cycles, EMEND approved for cycle 3, helpful with nausea but likely allergic reaction to this cycle 4. Cycle 6 completed on 12-08-14.     Review of systems as above, also: No fever or symptoms of infection. No LE swelling. Bladder ok. Remainder of 10 point Review of Systems negative.  Objective:  Vital signs in last 24 hours:  BP 115/72 mmHg  Pulse 87  Temp(Src) 98.7 F (37.1 C) (Oral)  Resp 18  Ht _0  (1.651 m)  Wt 205 lb 14.4 oz (93.396 kg)  BMI 34.26 kg/m2 Weight up 6 lbs  Alert, oriented and appropriate, very pleasant as always. Ambulatory without difficulty. Respirations not labored with exertion in exam room.  Alopecia  HEENT:PERRL, sclerae not icteric. Oral mucosa moist without lesions, posterior pharynx clear.  Neck supple. No JVD.  Lymphatics:no cervical,supraclavicular or inguinal adenopathy Resp: clear to auscultation bilaterally and normal percussion bilaterally Cardio: regular rate and rhythm. No gallop. NL:GXQJJHE obese,  soft, nontender, not distended, no mass or organomegaly. Normally active bowel sounds. Surgical incision not remarkable. Musculoskeletal/ Extremities: without pitting edema, cords, tenderness Neuro: peripheral neuropathy as described. Otherwise nonfocal. Psych appropriate mood and affect Skin  without rash, ecchymosis, petechiae   Lab Results:  Results for orders placed or performed in visit on 12/22/14  CBC with Differential  Result Value Ref Range   WBC 12.4 (H) 3.9 - 10.3 10e3/uL   NEUT# 7.2 (H) 1.5 - 6.5 10e3/uL   HGB 8.1 (L) 11.6 - 15.9 g/dL   HCT 24.4 (L) 34.8 - 46.6 %   Platelets 154 145 - 400 10e3/uL   MCV 103.8 (H) 79.5 - 101.0 fL   MCH 34.5 (H) 25.1 - 34.0 pg   MCHC 33.2 31.5 - 36.0 g/dL   RBC 2.35 (L) 3.70 - 5.45 10e6/uL   RDW 19.1 (H) 11.2 - 14.5 %   lymph# 2.9 0.9 - 3.3 10e3/uL   MONO# 2.2 (H) 0.1 - 0.9 10e3/uL   Eosinophils Absolute 0.1 0.0 - 0.5 10e3/uL   Basophils Absolute 0.0 0.0 - 0.1 10e3/uL   NEUT% 57.8 38.4 - 76.8 %   LYMPH% 23.7 14.0 - 49.7 %   MONO% 17.3 (H) 0.0 - 14.0 %   EOS% 1.0 0.0 - 7.0 %   BASO% 0.2 0.0 - 2.0 %  Comprehensive metabolic panel (Cmet) - [facility]  Result Value Ref Range   Sodium 141 136 - 145 mEq/L   Potassium 3.3 (L) 3.5 - 5.1 mEq/L   Chloride 100 98 - 109 mEq/L   CO2 26 22 - 29 mEq/L   Glucose 111 70 - 140 mg/dl   BUN 15.9 7.0 - 26.0 mg/dL   Creatinine 1.0 0.6 - 1.1 mg/dL   Total Bilirubin 0.56 0.20 - 1.20 mg/dL   Alkaline Phosphatase 83 40 - 150 U/L   AST 20 5 - 34 U/L   ALT 12 0 - 55 U/L   Total Protein 7.6 6.4 - 8.3 g/dL   Albumin 3.7 3.5 - 5.0 g/dL   Calcium 8.9 8.4 - 10.4 mg/dL   Anion Gap 15 (H) 3 - 11 mEq/L   EGFR 78 (L) >90 ml/min/1.73 m2   CA 125 available after visit 20, this having been 18 in June and 16 in April  Iron studies 08-18-14 had serum iron 43 and 11% sat   Studies/Results:  No results found.  CT CAP pending 01-09-15  Medications: I have reviewed the patient's current medications.She never filled potassium prescription, with K now fine,  and is out of Hemocyte - refilled.  DISCUSSION: Explained that it often takes several months to gradually feel back to baseline energy after chemo completes. Hopefully the anemia will improve also out from chemo, continue oral Hemocyte and will follow.  Hopefully also the peripheral neuropathy symptoms will improve; could add gabapentin or amitriptyline at hs if needed (she does not want other meds now).  See GOG 0225 information above, not discussed now. Weight gain may be a problem on Megace - will need to educate carefully.  Assessment/Plan:  1. 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  She will have CT CAP prior to follow up visit with gyn oncology. Possible consolidation Megace; if not Megace, probably eligible for GOG 0225. I will see her in Sept. Will follow CA 125. 2.chemo and iron deficiency anemia: plan as above. Will recheck labs with gyn onc visit 01-23-15. 3.Hypertensive urgency at presentation, BP variable but in better  range now especially on home monitoring, followed by PCP with [facility] 4.overdue mammograms, mammogram scholarship 5.family history cancers, however genetics testing negative 6.intermittent low back pain: does not seem related to the cancer diagnosis degenerative change seen on MRI L spine.  7.social concerns: Medicaid approved now 8.Mild mediastinal and hilar adenopathy of uncertain significance, seen on CT 06-28-14. Will follow with repeat CT chest in addition to AP upcoming. 9.increased peripheral neuropathy related to taxol in feet primarily: hopefully will improve off of chemo now, follow.  10. Post partial thyroidectomy  11. DIfficult IV access  12.allergy to EMEND 13.Obesity: BMI 34. Will need dietician to see and providers to educate ongoing if on Megace   All questions answered and patient is in agreement with recommendations and plans as above. She knows to call prior to next scheduled visits if needed. Time spent 25 min including >50% counseling and coordination of care.    LIVESAY,LENNIS P, MD   12/22/2014, 1:55 PM

## 2014-12-23 LAB — CA 125: CA 125: 20 U/mL (ref <35)

## 2014-12-24 ENCOUNTER — Encounter: Payer: Self-pay | Admitting: Oncology

## 2014-12-24 DIAGNOSIS — E66811 Obesity, class 1: Secondary | ICD-10-CM | POA: Insufficient documentation

## 2014-12-24 DIAGNOSIS — E669 Obesity, unspecified: Secondary | ICD-10-CM | POA: Insufficient documentation

## 2014-12-24 NOTE — Progress Notes (Signed)
[place] END OF TREATMENT   Name: Sarah [name] Date: December 24, 2014  MRN: [id number] DOB: 1965-12-13   TREATMENT DATES: 08-25-14 thru 12-08-14  REFERRING PHYSICIAN: Janie Morning Everitt Amber  DIAGNOSIS:  primary peritoneal carcinoma  STAGE AT START OF TREATMENT: IIIC   INTENT: curative   DRUGS OR REGIMENS GIVEN: carboplatin taxol   MAJOR TOXICITIES: nausea, peripheral neuropathy, cytopenias  REASON TREATMENT STOPPED: completion of planned course   PERFORMANCE STATUS AT END: 1  ONGOING PROBLEMS: peripheral neuropathy in feet, anemia, fatigue   FOLLOW UP PLANS: restaging CTs and follow up with gyn oncology. Follow up blood counts. Medical Oncology

## 2014-12-28 ENCOUNTER — Telehealth: Payer: Self-pay | Admitting: *Deleted

## 2014-12-28 DIAGNOSIS — G620 Drug-induced polyneuropathy: Secondary | ICD-10-CM

## 2014-12-28 DIAGNOSIS — T451X5A Adverse effect of antineoplastic and immunosuppressive drugs, initial encounter: Principal | ICD-10-CM

## 2014-12-28 MED ORDER — GABAPENTIN 100 MG PO CAPS: ORAL_CAPSULE | ORAL | Status: DC

## 2014-12-28 NOTE — Telephone Encounter (Signed)
Pt called and left message re: pt still has discomfort on feet and painful to stand or walk.  Pt wanted to know what pt could do to help symptom.  Spoke with pt and was informed that pt completed chemo on 12/08/14.  Pt had seen [name] on 12/22/14.  Pt was informed by md that her feet discomfort was chemotherapy side effects.  Informed pt that it would take months for neuropathy to get better.  Pt stated she could ambulate ok, can get around and does things ok.  Instructed pt to call office if symptoms worsened, and/or pt cannot get around easily. Pt's  Phone    [phone].

## 2014-12-28 NOTE — Addendum Note (Signed)
Addended by: Christa See on: 12/28/2014 03:39 PM   Modules accepted: Orders

## 2014-12-28 NOTE — Telephone Encounter (Signed)
Gabapentin sent to patient's pharmacy as noted below by [name]. Discussed with patient that this medication will make her drowsy and to start with 1 capsule at bedtime and to call back in approximately 1 week to give [place] an update and the medication can be adjusted if necessary. Told patient that once she is used to the medication and if it is effective for her symptoms, [name] can prescribe it to be taken during the day as well if needed. Patient agreeable to this and appreciative of call.

## 2014-12-28 NOTE — Telephone Encounter (Signed)
Re triage note neuropathy in feet.  Have her try gabapentin starting with 100 mg at hs. Tell her this will make her drowsy. She should let us know how she is with this dose in ~ a week. If not helping enough can increase to 200 mg at hs and again let us know how she is with this after a week or so.  Tell her that the medicine can be used more often, but should start at bedtime only until she gets accustomed to it. After she gets used to it, the drowsiness is not as bothersome. This does not cure the neuropathy but can help with symptoms as we give it more time out from chemo to improve.  Send script for gabapentin 100 mg 1-2 as directed at hs #60.

## 2015-01-09 ENCOUNTER — Ambulatory Visit (HOSPITAL_COMMUNITY): Payer: Medicaid Other

## 2015-01-12 ENCOUNTER — Encounter (HOSPITAL_COMMUNITY): Payer: Self-pay

## 2015-01-12 ENCOUNTER — Ambulatory Visit (HOSPITAL_COMMUNITY)
Admission: RE | Admit: 2015-01-12 | Discharge: 2015-01-12 | Disposition: A | Discharge status: Home / Self Care | Payer: Medicaid Other | Source: Ambulatory Visit | Attending: Oncology | Admitting: Oncology

## 2015-01-12 ENCOUNTER — Other Ambulatory Visit: Payer: Self-pay | Admitting: Internal Medicine

## 2015-01-12 DIAGNOSIS — C482 Malignant neoplasm of peritoneum, unspecified: Secondary | ICD-10-CM | POA: Insufficient documentation

## 2015-01-12 DIAGNOSIS — Z90722 Acquired absence of ovaries, bilateral: Secondary | ICD-10-CM | POA: Diagnosis not present

## 2015-01-12 DIAGNOSIS — N281 Cyst of kidney, acquired: Secondary | ICD-10-CM | POA: Insufficient documentation

## 2015-01-12 DIAGNOSIS — Z9221 Personal history of antineoplastic chemotherapy: Secondary | ICD-10-CM | POA: Diagnosis not present

## 2015-01-12 DIAGNOSIS — K429 Umbilical hernia without obstruction or gangrene: Secondary | ICD-10-CM | POA: Diagnosis not present

## 2015-01-12 DIAGNOSIS — R918 Other nonspecific abnormal finding of lung field: Secondary | ICD-10-CM | POA: Insufficient documentation

## 2015-01-12 DIAGNOSIS — Z90710 Acquired absence of both cervix and uterus: Secondary | ICD-10-CM | POA: Insufficient documentation

## 2015-01-12 MED ORDER — IOHEXOL 300 MG/ML  SOLN
100.0000 mL | Freq: Once | INTRAMUSCULAR | Status: AC | PRN
  Administered 2015-01-12: 100 mL via INTRAVENOUS

## 2015-01-13 NOTE — Telephone Encounter (Signed)
Left a message for [name] requesting a follow up call to see if the gabapentin 100 mg at hs  is helping her neuropathy in her feet.

## 2015-01-17 NOTE — Telephone Encounter (Signed)
Reached [name].  She stated that the pain in her feet from the  neuropathy is better with the gabapentin 100 mg at HS and with time.  She is sleeping better and and experiencing less pain and discomfort in feet during the day. She will continue the 100 mg at [facility] for now and will re-evaluate at visit with [name] 02-16-15.

## 2015-01-19 ENCOUNTER — Other Ambulatory Visit: Payer: Self-pay

## 2015-01-19 NOTE — Telephone Encounter (Signed)
Nurse called patient, patient verified date of birth. Patient aware of need to request gabapentin from [name], oncologist. Patient agrees to call Dr. Mariana Kaufman office to request refill.

## 2015-01-23 ENCOUNTER — Encounter: Payer: Self-pay | Admitting: Gynecologic Oncology

## 2015-01-23 ENCOUNTER — Other Ambulatory Visit (HOSPITAL_BASED_OUTPATIENT_CLINIC_OR_DEPARTMENT_OTHER): Payer: Medicaid Other

## 2015-01-23 ENCOUNTER — Ambulatory Visit: Payer: Medicaid Other | Attending: Gynecologic Oncology | Admitting: Gynecologic Oncology

## 2015-01-23 VITALS — BP 135/82 | HR 74 | Temp 98.4°F | Resp 18 | Ht 65.0 in | Wt 206.8 lb

## 2015-01-23 DIAGNOSIS — C786 Secondary malignant neoplasm of retroperitoneum and peritoneum: Secondary | ICD-10-CM | POA: Diagnosis present

## 2015-01-23 DIAGNOSIS — C482 Malignant neoplasm of peritoneum, unspecified: Secondary | ICD-10-CM

## 2015-01-23 DIAGNOSIS — C569 Malignant neoplasm of unspecified ovary: Secondary | ICD-10-CM | POA: Diagnosis not present

## 2015-01-23 LAB — CBC WITH DIFFERENTIAL/PLATELET
BASO%: 0.2 % (ref 0.0–2.0)
Basophils Absolute: 0 10*3/uL (ref 0.0–0.1)
EOS ABS: 0 10*3/uL (ref 0.0–0.5)
EOS%: 0.7 % (ref 0.0–7.0)
HCT: 29.7 % — ABNORMAL LOW (ref 34.8–46.6)
HGB: 9.6 g/dL — ABNORMAL LOW (ref 11.6–15.9)
LYMPH%: 41.2 % (ref 14.0–49.7)
MCH: 33.4 pg (ref 25.1–34.0)
MCHC: 32.3 g/dL (ref 31.5–36.0)
MCV: 103.5 fL — AB (ref 79.5–101.0)
MONO#: 0.7 10*3/uL (ref 0.1–0.9)
MONO%: 12.7 % (ref 0.0–14.0)
NEUT#: 2.6 10*3/uL (ref 1.5–6.5)
NEUT%: 45.2 % (ref 38.4–76.8)
Platelets: 218 10*3/uL (ref 145–400)
RBC: 2.87 10*6/uL — AB (ref 3.70–5.45)
RDW: 15 % — ABNORMAL HIGH (ref 11.2–14.5)
WBC: 5.7 10*3/uL (ref 3.9–10.3)
lymph#: 2.4 10*3/uL (ref 0.9–3.3)
nRBC: 0 % (ref 0–0)

## 2015-01-23 NOTE — Progress Notes (Signed)
POST-TREATMENT FOLLOWUP. TREATMENT COUNSELING.  Assessment:    49 y.o. 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   S/p BSO, omentectomy, appendectomy on 07/28/14. S/p adjuvant chemotherapy with 6 cycles of paclitaxel and carboplatin completed on 12/08/14  Plan: 1) CT results reviewed today 2) Treatment counseling -  Mixed/partial response to therapy with peristent (though reduced in size) nodules remaining chest and abdomen. One new chest lesion. Asymptomatic. Recommend expectant management for now. Will repeat imaging and CA 125 assessment in 3 months. If increased size of lesions, would recommend considering second line therapy (including consideration for Megace, Letrozole or MEC inhibitor). 3)  Return to clinic in 3 months. 4) umbilical hernia: asymptomatic. Given the close proximity to completing chemotherapy and the question of whether there may be active disease, I do not recommend repair at this time. It is asymptomatic.  HPI:  Sarah [name] is a 48 y.o. year old initially seen in consultation on 07/08/14 for bilateral ovarian masses and ascites. This was diagnosed at the time of admission to the hospital for hypertensive crisis. This diagnosis of hypertension was new and she was newly started on a medical regimen for this. She then underwent an exploratory laparotomy, BSO, omentectomy, appendectomy on 07/28/14 with Dr Skeet Latch without complications.  Her postoperative course was uncomplicated.  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 This represented a stage IIIc disease process.  She received 6 cycles of adjuvant chemotherapy with paclitaxel and carboplatin with Dr Marko Plume on 08/25/14 to 12/08/14.  Pretreatment CA 125 was 58 on 06/30/14, and was 20 on 12/22/14 (it's nadir had been 16 on 09/22/14).  Post treatment CT of the chest, abdomen and pelvis on 01/12/15 revealed:  Previously noted paratracheal, subcarinal and prevascular mediastinal lymphadenopathy has resolved, with no residual pathologically enlarged mediastinal lymph nodes. Resolved hilar lymphadenopathy, with no residual pathologically enlarged mediastinal nodes. The previously described 3 mm subpleural left lower lobe pulmonary nodule is decreased to 2 mm (series 4/image 41). A separate 2 mm subpleural left lower lobe pulmonary nodule (4/29) is slightly decreased from 29mm on 06/28/2014. A subpleural 3 mm right upper lobe pulmonary nodule (4/15) is unchanged from 06/28/2014. A 2 mm subpleural pulmonary nodule in the superior segment right lower lobe associated with the right major fissure (4/21) appears new. A 0.7 cm right posterior mesenteric node measures 0.7 cm short axis (series 2/image 93), decreased from 1.2 cm. No definite peritoneal tumor implants detected. A 0.4 cm nodule anterior/inferior to the spleen (series 2/ image 60) is mildly decreased from 0.7 cm. Small umbilical hernia containing a small bowel loop, with no evidence of small-bowel obstruction or strangulation.  Interval Hx: she is doing well post treatment with residual symptoms of neuropathy in the feet that is stable. She denies cough. She denies abdominal symptoms other than a painless bulge in the mid abdomen.  Current Outpatient Prescriptions on File Prior to Visit  Medication Sig Dispense Refill  . acetaminophen (TYLENOL) 500 MG tablet Take 1,000 mg by mouth every 6 (six) hours as needed for mild pain or headache.    Marland Kitchen amLODipine (NORVASC) 10 MG tablet TAKE 1 TABLET BY MOUTH DAILY 30 tablet 3  . atorvastatin (LIPITOR) 20 MG tablet Take 1 tablet (20 mg total) by mouth daily. 90 tablet 3  . docusate sodium (COLACE) 100 MG capsule Take 100 mg by mouth 2 (two) times daily as needed.     . ferrous  fumarate (HEMOCYTE - 106 MG FE) 325 (106 FE) MG TABS tablet Take 1 tablet (106 mg of iron total) by mouth daily. On empty stomach with orange  juice 30 each 3  . gabapentin (NEURONTIN) 100 MG capsule Take 1-2 tablets by mouth at bedtime as directed. 60 capsule 0  . lisinopril-hydrochlorothiazide (PRINZIDE,ZESTORETIC) 10-12.5 MG per tablet Take 1 tablet by mouth daily. 90 tablet 3  . metoprolol tartrate (LOPRESSOR) 25 MG tablet TAKE 1/2 TABLET BY MOUTH 2 TIMES A DAY 60 tablet 0  . pantoprazole (PROTONIX) 40 MG tablet Take 1 tablet (40 mg total) by mouth daily. 30 tablet 0  . polyethylene glycol (MIRALAX) packet Take 17 g by mouth 2 (two) times daily as needed. 24 each 5  . LORazepam (ATIVAN) 1 MG tablet Place 1 tablet under tongue or swallow every 6 hours as needed for nausea. Will make drowsy. (Patient not taking: Reported on 01/23/2015) 30 tablet 0  . ondansetron (ZOFRAN) 8 MG tablet Take 1 tablet by mouth every 8 hours as needed for nausea. Will not make drowsy. (Patient not taking: Reported on 01/23/2015) 30 tablet 1  . Oxycodone HCl 10 MG TABS Take 1 tablet (10 mg total) by mouth every 6 (six) hours as needed. (Patient not taking: Reported on 11/24/2014) 20 tablet 0  . temazepam (RESTORIL) 15 MG capsule Take 1 capsule (15 mg total) by mouth at bedtime as needed for sleep. (Patient not taking: Reported on 01/23/2015) 30 capsule 1   No current facility-administered medications on file prior to visit.   Allergies  Allergen Reactions  . Emend [Aprepitant] Other (See Comments)    Urticaria    Past Medical History  Diagnosis Date  . Hypertension   . Glucagonoma 07/28/14    Pt denies this but states she has glaucoma  . Shortness of breath dyspnea     hx of 2013 - no problems currently   . Glaucoma 02/16  . Peritoneal carcinomatosis   . Family history of colon cancer    Past Surgical History  Procedure Laterality Date  . Abdominal hysterectomy    . Thyroidectomy, partial    . Robotic assisted total hysterectomy with bilateral salpingo oopherectomy Bilateral 07/28/2014    Procedure: ROBOTIC ASSISTED lysis of adhesions with  biopsies, converted to LAPAROTOMY, bilateral salpingoorphorectomy, omentectomy,appendectomy;  Surgeon: Janie Morning, MD;  Location: [facility];  Service: Gynecology;  Laterality: Bilateral;   Family History  Problem Relation Age of Onset  . Hypertension Mother   . Hypertension Father   . Diabetes Father   . Cancer Sister 50    fibrosarcoma (back); currently 18  . Prostate cancer Maternal Uncle   . Colon cancer Paternal Aunt     Dx 51s; deceased 61s  . Prostate cancer Paternal Uncle     currently 98  . Cancer Paternal Uncle 69    "bone" ; unk. primary  . Stomach cancer Paternal Uncle    Social History   Social History  . Marital Status: Married    Spouse Name: N/A  . Number of Children: N/A  . Years of Education: N/A   Occupational History  . Not on file.   Social History Main Topics  . Smoking status: Never Smoker   . Smokeless tobacco: Never Used  . Alcohol Use: No  . Drug Use: No  . Sexual Activity: Not on file   Other Topics Concern  . Not on file   Social History Narrative    Review of systems: Constitutional:  She  has no weight gain or weight loss. She has no fever or chills. Eyes: No blurred vision Ears, Nose, Mouth, Throat: No dizziness, headaches or changes in hearing. No mouth sores. Cardiovascular: No chest pain, palpitations or edema. Respiratory:  No shortness of breath, wheezing or cough Gastrointestinal: She has normal bowel movements without diarrhea or constipation. She denies any nausea or vomiting. She denies blood in her stool or heart burn. Genitourinary:  She denies pelvic pain, pelvic pressure or changes in her urinary function. She has no hematuria, dysuria, or incontinence. She has no irregular vaginal bleeding or vaginal discharge Musculoskeletal: Denies muscle weakness or joint pains.  Skin:  She has no skin changes, rashes or itching Neurological:  Denies dizziness or headaches. + bilateral foot neuropathy Psychiatric:  She denies  depression or anxiety. Hematologic/Lymphatic:   No easy bruising or bleeding   Physical Exam: Blood pressure 135/82, pulse 74, temperature 98.4 F (36.9 C), temperature source Oral, resp. rate 18, height 5\' 5"  (1.651 m), weight 206 lb 12.8 oz (93.804 kg), SpO2 100 %. General: Well dressed, well nourished in no apparent distress.   HEENT:  Normocephalic and atraumatic, no lesions.  Extraocular muscles intact. Sclerae anicteric. Pupils equal, round, reactive. No mouth sores or ulcers. Thyroid is normal size, not nodular, midline. Skin:  No lesions or rashes. Breasts:  Soft, symmetric.  No skin or nipple changes.  No palpable LN or masses. Lungs:  Clear to auscultation bilaterally.  No wheezes. Cardiovascular:  Regular rate and rhythm.  No murmurs or rubs. Abdomen:  Soft, nontender, nondistended.  No palpable masses.  No hepatosplenomegaly.  No ascites. Normal bowel sounds.  No hernias.  Incision is healed. Left periumbilical hernia measuring approximately 5cm is palpable, soft and reducible. Genitourinary: Normal EGBUS  Vaginal cuff intact.  No bleeding or discharge.  No cul de sac fullness. Extremities: No cyanosis, clubbing or edema.  No calf tenderness or erythema. No palpable cords. Psychiatric: Mood and affect are appropriate. Neurological: Awake, alert and oriented x 3. Sensation is intact, no neuropathy.  Musculoskeletal: No pain, normal strength and range of motion.  Donaciano Eva, MD

## 2015-01-23 NOTE — Patient Instructions (Signed)
Plan for CT scan and CA125 lab in 3 months followed by appointment with Dr. [name]. Please call our office with any questions or concerns you have.

## 2015-02-12 ENCOUNTER — Other Ambulatory Visit: Payer: Self-pay | Admitting: Oncology

## 2015-02-14 ENCOUNTER — Telehealth: Payer: Self-pay

## 2015-02-14 NOTE — Telephone Encounter (Signed)
LM in voice mail requestingthat Sarah [name] call back to [facility] on 02-15-15 and let Dr. Mariana Kaufman nurse know if she wants to keep appointment on 02-16-15 or R/S to ~03-16-15 as noted below. Will need to sent an order to the schedulers if patient would like to reschedule.

## 2015-02-14 NOTE — Telephone Encounter (Signed)
-----   Message from Gordy Levan, MD sent at 02/12/2015  7:35 PM EDT ----- Probably can move LL apt+ lab from 0830 on 9-8 until early Oct + lab. She just saw Dr [name], doing ok, anemia some better, to have another CT and see Dr [name] again in Nov.  If she wants to keep apt 9-8 that is ok, otherwise please move to ~ Oct 6   CBC CMET CA125 I have not done POF so RN can speak with her before schedulers  Thank you

## 2015-02-15 ENCOUNTER — Telehealth: Payer: Self-pay | Admitting: Oncology

## 2015-02-15 NOTE — Telephone Encounter (Signed)
Ms. Shipton returned call stating that she is fine with cancelling tomorrow's appointment 02-16-15 with [name] and rescheduling it to early October. Urgent POF sent to schedulers to r/s appointment.

## 2015-02-15 NOTE — Telephone Encounter (Signed)
s.w.l pt and adivsed on cx 9.8 appt r/s to 10.6.Marland KitchenMarland KitchenMarland KitchenMarland Kitchenpt ok and aware

## 2015-02-16 ENCOUNTER — Other Ambulatory Visit: Payer: Medicaid Other

## 2015-02-16 ENCOUNTER — Ambulatory Visit: Payer: Medicaid Other | Admitting: Oncology

## 2015-02-20 ENCOUNTER — Telehealth: Payer: Self-pay | Admitting: Internal Medicine

## 2015-02-20 NOTE — Telephone Encounter (Signed)
Patient called to request a med refill for all of the current medications, patient needs enough to last until next appointment. Patient uses [facility] pharmacy. Please f/u with pt.

## 2015-02-23 ENCOUNTER — Other Ambulatory Visit: Payer: Self-pay | Admitting: *Deleted

## 2015-02-23 DIAGNOSIS — I10 Essential (primary) hypertension: Secondary | ICD-10-CM

## 2015-02-23 MED ORDER — METOPROLOL TARTRATE 25 MG PO TABS: ORAL_TABLET | ORAL | Status: DC

## 2015-03-13 ENCOUNTER — Telehealth: Payer: Self-pay | Admitting: Oncology

## 2015-03-13 NOTE — Telephone Encounter (Signed)
Patient called in to cancel her 10/6 appointment and has been rescheduled to an afternoon per her request

## 2015-03-15 ENCOUNTER — Other Ambulatory Visit: Payer: Self-pay | Admitting: Oncology

## 2015-03-16 ENCOUNTER — Other Ambulatory Visit: Payer: Medicaid Other

## 2015-03-16 ENCOUNTER — Ambulatory Visit: Payer: Self-pay | Admitting: Oncology

## 2015-03-27 ENCOUNTER — Telehealth: Payer: Self-pay | Admitting: Oncology

## 2015-03-27 ENCOUNTER — Ambulatory Visit (HOSPITAL_BASED_OUTPATIENT_CLINIC_OR_DEPARTMENT_OTHER): Payer: Medicaid Other | Admitting: Oncology

## 2015-03-27 ENCOUNTER — Other Ambulatory Visit (HOSPITAL_BASED_OUTPATIENT_CLINIC_OR_DEPARTMENT_OTHER): Payer: Medicaid Other

## 2015-03-27 ENCOUNTER — Encounter: Payer: Self-pay | Admitting: Oncology

## 2015-03-27 VITALS — BP 135/72 | HR 74 | Temp 97.4°F | Resp 18 | Ht 65.0 in | Wt 216.8 lb

## 2015-03-27 DIAGNOSIS — R05 Cough: Secondary | ICD-10-CM | POA: Diagnosis not present

## 2015-03-27 DIAGNOSIS — Z6836 Body mass index (BMI) 36.0-36.9, adult: Secondary | ICD-10-CM | POA: Diagnosis not present

## 2015-03-27 DIAGNOSIS — T451X5A Adverse effect of antineoplastic and immunosuppressive drugs, initial encounter: Secondary | ICD-10-CM | POA: Diagnosis not present

## 2015-03-27 DIAGNOSIS — G620 Drug-induced polyneuropathy: Secondary | ICD-10-CM | POA: Diagnosis not present

## 2015-03-27 DIAGNOSIS — C482 Malignant neoplasm of peritoneum, unspecified: Secondary | ICD-10-CM | POA: Diagnosis not present

## 2015-03-27 DIAGNOSIS — E669 Obesity, unspecified: Secondary | ICD-10-CM | POA: Diagnosis not present

## 2015-03-27 DIAGNOSIS — Z23 Encounter for immunization: Secondary | ICD-10-CM | POA: Diagnosis not present

## 2015-03-27 LAB — COMPREHENSIVE METABOLIC PANEL (CC13)
ALBUMIN: 3.8 g/dL (ref 3.5–5.0)
ALK PHOS: 65 U/L (ref 40–150)
ALT: 33 U/L (ref 0–55)
ANION GAP: 11 meq/L (ref 3–11)
AST: 39 U/L — ABNORMAL HIGH (ref 5–34)
BUN: 16.7 mg/dL (ref 7.0–26.0)
CO2: 26 mEq/L (ref 22–29)
Calcium: 10.1 mg/dL (ref 8.4–10.4)
Chloride: 102 mEq/L (ref 98–109)
Creatinine: 1.1 mg/dL (ref 0.6–1.1)
EGFR: 67 mL/min/{1.73_m2} — AB (ref >90)
Glucose: 232 mg/dl — ABNORMAL HIGH (ref 70–140)
Potassium: 3.9 mEq/L (ref 3.5–5.1)
Sodium: 139 mEq/L (ref 136–145)
Total Bilirubin: 0.56 mg/dL (ref 0.20–1.20)
Total Protein: 8 g/dL (ref 6.4–8.3)

## 2015-03-27 LAB — CBC WITH DIFFERENTIAL/PLATELET
BASO%: 0.2 % (ref 0.0–2.0)
Basophils Absolute: 0 10*3/uL (ref 0.0–0.1)
EOS%: 0.9 % (ref 0.0–7.0)
Eosinophils Absolute: 0.1 10*3/uL (ref 0.0–0.5)
HEMATOCRIT: 36.7 % (ref 34.8–46.6)
HEMOGLOBIN: 12.4 g/dL (ref 11.6–15.9)
LYMPH%: 38.1 % (ref 14.0–49.7)
MCH: 31.9 pg (ref 25.1–34.0)
MCHC: 33.8 g/dL (ref 31.5–36.0)
MCV: 94.3 fL (ref 79.5–101.0)
MONO#: 0.5 10*3/uL (ref 0.1–0.9)
MONO%: 8.7 % (ref 0.0–14.0)
NEUT#: 3.1 10*3/uL (ref 1.5–6.5)
NEUT%: 52.1 % (ref 38.4–76.8)
Platelets: 255 10*3/uL (ref 145–400)
RBC: 3.89 10*6/uL (ref 3.70–5.45)
RDW: 13.2 % (ref 11.2–14.5)
WBC: 5.9 10*3/uL (ref 3.9–10.3)
lymph#: 2.2 10*3/uL (ref 0.9–3.3)
nRBC: 0 % (ref 0–0)

## 2015-03-27 MED ORDER — INFLUENZA VAC SPLIT QUAD 0.5 ML IM SUSY
0.5000 mL | PREFILLED_SYRINGE | Freq: Once | INTRAMUSCULAR | Status: AC
  Administered 2015-03-27: 0.5 mL via INTRAMUSCULAR
  Filled 2015-03-27: qty 0.5

## 2015-03-27 NOTE — Progress Notes (Signed)
OFFICE PROGRESS NOTE   March 27, 2015   Physicians:Sarah [name]/ W. Kathlynn Grate, MD (PCP [facility])  INTERVAL HISTORY:  Patient is seen, alone for visit, in follow up of IIIC low grade serous primary peritoneal carcinoma, on observation since completing 6 cycles of carboplatin taxol on 12-08-14. CT CAP 01-12-15 showed resolution of thoracic adenopathy, 2-3 mm pulmonary nodules of uncertain significance and no new or definite residual disease abdomen and pelvis. Dr Sarah [name] plans repeat CT with CA 125 in Nov prior to return visit.  Patient has generally felt well, tho has had dry cough just at night for past week. She denies fever, SOB, hoarseness, upper respiratory symptoms. Cough begins after she has been in bed for an hour or more.  Energy has improved and she is now walking ~ 2 miles once weekly; I have encouraged her to increase this to 2-3x weekly. Appetite if fine, no nausea. Bowels are moving well ~ 2x daily. The ventral hernia is not uncomfortable. She has no pain other than peripheral neuropathy discomfort in feet most bothersome at night. Gabapentin 100 mg at hs was not helpful for the peripheral neuropathy and she has stopped that. Peripheral neuropathy symptoms are better when she is walking and moving.    No PAC Genetics testing normal by [employer] panel 09-15-14 Pre-op CA 125 (06-30-14) 58 Flu vaccine 03-27-15   She has not had mammograms, will request mammogram scholarship if qualifies.  ONCOLOGIC HISTORY Patient presented to ED 06-28-14 with fairly acute onset of low back pain, without known trauma, so severe at that time that she was having difficulty standing and walking; blood pressure in ED was 260/148 such that she was admitted for hypertensive urgency. Evaluation included CT CAP 06-28-14 which demonstrated a 4.4 cm right adnexal mass, with stranding in omentum and small fluid in pelvis, as well as mild mediastinal and hilar adenopathy. US  showed 4.6 x 5.2 x 3.8 cm apparent complex ovarian mass. CA 125 was 58 on 06-30-14. She was seen in consultation by Dr Sarah [name] on 07-08-14. Imaging otherwise showed no aortic aneurysm; MRI of L spine 06-29-14 found some facet hypertrophy and small right paracentral disc protrusion L5S1. Blood pressure was treated and patient established with [name], Dr Sarah [name].  She was stable for surgery by Dr Sarah [name] 07-28-14, which was exploratory laparotomy with BSO, infragastric omentectomy, washings and biopsies and appendectomy. Pathology [phone]) found low grade serous carcinoma thruout. Post operative course was unremarkable. She saw Dr Sarah [name] on 08-03-14, with recommendation for 6 cycles of taxol and carboplatin then consideration of Megace maintenance; she will see Dr Sarah [name] again after chemo completes. Cycle 1 carbo taxol was 08-25-14, with leukopenia by day 8 such that granix was added x 3 doses. Delayed nausea was a problem with initial cycles, EMEND approved for cycle 3, helpful with nausea but likely allergic reaction to this cycle 4. Cycle 6 completed on 12-08-14.    Review of systems as above, also: No bleeding. No peripheral neuropathy in hands. No noted changes in breasts Remainder of 10 point Review of Systems negative.  Objective:  Vital signs in last 24 hours:  BP 135/72 mmHg  Pulse 74  Temp(Src) 97.4 F (36.3 C) (Oral)  Resp 18  Ht 5' 5" (1.651 m)  Wt 216 lb 12.8 oz (98.34 kg)  BMI 36.08 kg/m2  SpO2 98% Weight up 10 lbs from July Alert, oriented and appropriate. Ambulatory without difficulty.  No alopecia  HEENT:PERRL, sclerae not  icteric. Oral mucosa moist without lesions, posterior pharynx clear.  Neck supple. No JVD.  Lymphatics:no cervical,supraclavicular, axillary or inguinal adenopathy Resp: clear to auscultation bilaterally and normal percussion bilaterally Cardio: regular rate and rhythm. No gallop. GI: abdomen obese, soft, nontender, not distended, no  mass or organomegaly. Normally active bowel sounds. Ventral hernia at upper surgical scar not tender, soft. Musculoskeletal/ Extremities: without pitting edema, cords, tenderness Neuro:  peripheral neuropathy distal feet. Otherwise nonfocal. PSYCH appropriate mood and affect Skin without rash, ecchymosis, petechiae Breasts:bilaterally without dominant mass, skin or nipple findings. Axillae benign.   Lab Results:  Results for orders placed or performed in visit on 03/27/15  CBC with Differential/Platelet  Result Value Ref Range   WBC 5.9 3.9 - 10.3 10e3/uL   NEUT# 3.1 1.5 - 6.5 10e3/uL   HGB 12.4 11.6 - 15.9 g/dL   HCT 36.7 34.8 - 46.6 %   Platelets 255 145 - 400 10e3/uL   MCV 94.3 79.5 - 101.0 fL   MCH 31.9 25.1 - 34.0 pg   MCHC 33.8 31.5 - 36.0 g/dL   RBC 3.89 3.70 - 5.45 10e6/uL   RDW 13.2 11.2 - 14.5 %   lymph# 2.2 0.9 - 3.3 10e3/uL   MONO# 0.5 0.1 - 0.9 10e3/uL   Eosinophils Absolute 0.1 0.0 - 0.5 10e3/uL   Basophils Absolute 0.0 0.0 - 0.1 10e3/uL   NEUT% 52.1 38.4 - 76.8 %   LYMPH% 38.1 14.0 - 49.7 %   MONO% 8.7 0.0 - 14.0 %   EOS% 0.9 0.0 - 7.0 %   BASO% 0.2 0.0 - 2.0 %   nRBC 0 0 - 0 %  Comprehensive metabolic panel (Cmet) - [facility]  Result Value Ref Range   Sodium 139 136 - 145 mEq/L   Potassium 3.9 3.5 - 5.1 mEq/L   Chloride 102 98 - 109 mEq/L   CO2 26 22 - 29 mEq/L   Glucose 232 (H) 70 - 140 mg/dl   BUN 16.7 7.0 - 26.0 mg/dL   Creatinine 1.1 0.6 - 1.1 mg/dL   Total Bilirubin 0.56 0.20 - 1.20 mg/dL   Alkaline Phosphatase 65 40 - 150 U/L   AST 39 (H) 5 - 34 U/L   ALT 33 0 - 55 U/L   Total Protein 8.0 6.4 - 8.3 g/dL   Albumin 3.8 3.5 - 5.0 g/dL   Calcium 10.1 8.4 - 10.4 mg/dL   Anion Gap 11 3 - 11 mEq/L   EGFR 67 (L) >90 ml/min/1.73 m2   CA 125 available after visit 14, this having been 20 on 12-22-14  Studies/Results: CT CHEST, ABDOMEN, AND PELVIS WITH CONTRAST  TECHNIQUE: Multidetector CT imaging of the chest, abdomen and pelvis was performed following  the standard protocol during bolus administration of intravenous contrast.  CONTRAST: 165m OMNIPAQUE IOHEXOL 300 MG/ML SOLN  COMPARISON: CT angiogram of the chest, abdomen and pelvis from 06/28/2014.  FINDINGS: CT CHEST FINDINGS  Mediastinum/Nodes: Stable top-normal heart size. No pericardial fluid/thickening. There is nonspecific mild circumferential wall thickening in the patent right brachiocephalic artery, which is decreased in the interval. Great vessels are normal in course and caliber. No central pulmonary emboli. Stable appearance of the thyroid status post left hemithyroidectomy with mildly enlarged and heterogeneous residual right thyroid lobe. Normal esophagus. No axillary lymphadenopathy. Previously noted paratracheal, subcarinal and prevascular mediastinal lymphadenopathy has resolved, with no residual pathologically enlarged mediastinal lymph nodes. Resolved hilar lymphadenopathy, with no residual pathologically enlarged mediastinal nodes.  Lungs/Pleura: No pneumothorax. No pleural effusion. The  previously described 3 mm subpleural left lower lobe pulmonary nodule is decreased to 2 mm (series 4/image 41). A separate 2 mm subpleural left lower lobe pulmonary nodule (4/29) is slightly decreased from 3 mm on 06/28/2014. A subpleural 3 mm right upper lobe pulmonary nodule (4/15) is unchanged from 06/28/2014. A 2 mm subpleural pulmonary nodule in the superior segment right lower lobe associated with the right major fissure (4/21) appears new. No additional significant pulmonary nodules or lung masses. No areas of lung consolidation.  Musculoskeletal: No suspicious focal osseous lesions in the chest.  CT ABDOMEN AND PELVIS FINDINGS  Hepatobiliary: Normal liver, with no liver mass. Normal gallbladder, with no radiopaque cholelithiasis. No biliary ductal dilatation.  Pancreas: Normal.  Spleen: Normal.  Adrenals/Urinary Tract: Normal adrenals. Simple  0.9 cm renal cyst in the interpolar left kidney. No hydronephrosis. Normal caliber ureters. On delayed imaging, there is no urothelial wall thickening and there are no filling defects in the bilateral opacified portions of the proximal collecting systems or ureters. Normal urinary bladder.  Stomach/Bowel: Grossly normal stomach. Small umbilical hernia containing a normal caliber small bowel loop. Small bowel is normal caliber throughout, with no small bowel wall thickening. Status post appendectomy. Normal large bowel.  Vascular/Lymphatic: Normal caliber abdominal aorta. No pathologically enlarged lymph nodes in the abdomen or pelvis. A 0.7 cm right posterior mesenteric node measures 0.7 cm short axis (series 2/image 93), decreased from 1.2 cm.  Reproductive: Status post hysterectomy and bilateral oophorectomy. No abnormal findings of the vaginal cuff. No adnexal abnormalities.  Other: Status post interval omentectomy. No definite peritoneal tumor implants detected. A 0.4 cm nodule anterior/inferior to the spleen (series 2/ image 60) is mildly decreased from 0.7 cm. No pneumoperitoneum, ascites or focal fluid collection.  Musculoskeletal: No suspicious focal osseous lesions in the abdomen or pelvis.  IMPRESSION: 1. Resolved thoracic lymphadenopathy. 2. New 2 mm superior segment right lower lobe pulmonary nodule. Three additional tiny scattered pulmonary nodules are stable to decreased in size. 3. No new or definite residual metastatic disease in the abdomen or pelvis. A tiny 0.4 cm left upper quadrant nodule located anterior/inferior to the spleen is mildly decreased in size, and could represent a shrinking tumor implant versus a tiny splenule. 4. Small umbilical hernia containing a small bowel loop, with no evidence of small-bowel obstruction or strangulation  Medications: I have reviewed the patient's current medications. Will try Biofreeze spray to feet at hs if able  to get this thru [place]. Note on amlodipine for ~ 10 years, but no cough until recently. She is using OTC cough med now that seems helpful Flu vaccine today  DISCUSSION: Medications and mammogram scholarship as above Encouraged her to increase walking to 2-3x weekly She understands plan for follow up CT and marker with Dr Serita Grit appointment in Nov.  Cough may be allergic. Fine to continue OTC cough medication, let MD know if this persists or worsens. Doubt related to the tiny area in lungs seen on CT  Assessment/Plan:  1. IIIC low grade serous primary peritoneal carcinoma: completed 6 cycles carbo taxol  12-08-14. She will have repeat CT CAP prior to follow up visit with gyn oncology. Will follow CA 125. I will see her back ~ late Jan with labs 2.chemo and iron deficiency anemia: hemoglobin now in normal range 3.Hypertensive urgency at presentation, BP variable but in better range now especially on home monitoring, followed by PCP with [name] 4.mammogram scholarship requested, unless covered by medicaid 5.family  history cancers, genetics testing negative 6.intermittent low back pain: does not seem related to the cancer diagnosis degenerative change seen on MRI L spine.  7.cough at hs x 1 week: may be allergic. 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 Try biofreeze. Encouraged walking for exercise. 10. Post partial thyroidectomy  11. DIfficult IV access  12.allergy to EMEND 13.Obesity: BMI 36. Encouraged walking for exercise. 14.flu vaccine done   All questions answered. Time spent 20 min including >50% counseling and coordination of care LIVESAY,LENNIS P, MD   03/27/2015, 9:18 PM

## 2015-03-27 NOTE — Telephone Encounter (Signed)
Appointments made and avs printed for patient °

## 2015-03-28 ENCOUNTER — Telehealth: Payer: Self-pay

## 2015-03-28 LAB — CA 125: CA 125: 14 U/mL (ref <35)

## 2015-03-28 NOTE — Telephone Encounter (Signed)
Spoke with Gabriel Cirri about mammogram scholarship.  She will place Ms. Clayton on her lsit to call. Ms. Babino notified that Ms. Matthew Saras will call her regarding mammogram scholarship.  Ms. Freeburg verbalized understanding.

## 2015-03-29 ENCOUNTER — Other Ambulatory Visit: Payer: Self-pay | Admitting: Oncology

## 2015-03-29 ENCOUNTER — Telehealth: Payer: Self-pay

## 2015-03-29 DIAGNOSIS — Z1231 Encounter for screening mammogram for malignant neoplasm of breast: Secondary | ICD-10-CM

## 2015-03-29 NOTE — Telephone Encounter (Signed)
Spoke with Sarah [name] and told her the results as noted below by [name]. Ms. Gerstenberger also stated that she is set up for her mammogram at the [facility] on 04-07-15.

## 2015-03-29 NOTE — Telephone Encounter (Signed)
-----   Message from Gordy Levan, MD sent at 03/28/2015  6:49 PM EDT ----- Labs seen and need follow up: please let her know ca 125 is in good low range on recent lab. Keep appointments as scheduled

## 2015-03-29 NOTE — Telephone Encounter (Signed)
LM for Ms. Joya [name] that Medicaid does cover a screening mammogram.  Spoke with Baker Janus at the [facility] and she will call Ms. Mauger to set up appointment. Baker Janus placed order in epic for screening mammogram.

## 2015-04-07 ENCOUNTER — Ambulatory Visit
Admission: RE | Admit: 2015-04-07 | Discharge: 2015-04-07 | Disposition: A | Discharge status: Home / Self Care | Payer: Medicaid Other | Source: Ambulatory Visit | Attending: Oncology | Admitting: Oncology

## 2015-04-07 DIAGNOSIS — Z1231 Encounter for screening mammogram for malignant neoplasm of breast: Secondary | ICD-10-CM

## 2015-04-21 ENCOUNTER — Other Ambulatory Visit: Payer: Self-pay

## 2015-04-21 DIAGNOSIS — C561 Malignant neoplasm of right ovary: Secondary | ICD-10-CM

## 2015-04-27 ENCOUNTER — Ambulatory Visit (HOSPITAL_COMMUNITY)
Admission: RE | Admit: 2015-04-27 | Discharge: 2015-04-27 | Disposition: A | Discharge status: Home / Self Care | Payer: Medicaid Other | Source: Ambulatory Visit | Attending: Gynecologic Oncology | Admitting: Gynecologic Oncology

## 2015-04-27 ENCOUNTER — Encounter (HOSPITAL_COMMUNITY): Payer: Self-pay

## 2015-04-27 ENCOUNTER — Other Ambulatory Visit (HOSPITAL_BASED_OUTPATIENT_CLINIC_OR_DEPARTMENT_OTHER): Payer: Medicaid Other

## 2015-04-27 DIAGNOSIS — C561 Malignant neoplasm of right ovary: Secondary | ICD-10-CM | POA: Diagnosis not present

## 2015-04-27 DIAGNOSIS — R918 Other nonspecific abnormal finding of lung field: Secondary | ICD-10-CM | POA: Insufficient documentation

## 2015-04-27 DIAGNOSIS — K429 Umbilical hernia without obstruction or gangrene: Secondary | ICD-10-CM | POA: Insufficient documentation

## 2015-04-27 DIAGNOSIS — E890 Postprocedural hypothyroidism: Secondary | ICD-10-CM | POA: Insufficient documentation

## 2015-04-27 DIAGNOSIS — Z90710 Acquired absence of both cervix and uterus: Secondary | ICD-10-CM | POA: Insufficient documentation

## 2015-04-27 DIAGNOSIS — C569 Malignant neoplasm of unspecified ovary: Secondary | ICD-10-CM

## 2015-04-27 DIAGNOSIS — K760 Fatty (change of) liver, not elsewhere classified: Secondary | ICD-10-CM | POA: Insufficient documentation

## 2015-04-27 LAB — COMPREHENSIVE METABOLIC PANEL (CC13)
ALT: 39 U/L (ref 0–55)
AST: 49 U/L — ABNORMAL HIGH (ref 5–34)
Albumin: 3.7 g/dL (ref 3.5–5.0)
Alkaline Phosphatase: 74 U/L (ref 40–150)
Anion Gap: 11 mEq/L (ref 3–11)
BUN: 16.5 mg/dL (ref 7.0–26.0)
CALCIUM: 9.9 mg/dL (ref 8.4–10.4)
CO2: 25 mEq/L (ref 22–29)
Chloride: 102 mEq/L (ref 98–109)
Creatinine: 1.1 mg/dL (ref 0.6–1.1)
EGFR: 69 mL/min/{1.73_m2} — ABNORMAL LOW (ref >90)
Glucose: 170 mg/dl — ABNORMAL HIGH (ref 70–140)
Potassium: 4.1 mEq/L (ref 3.5–5.1)
SODIUM: 138 meq/L (ref 136–145)
Total Bilirubin: 0.44 mg/dL (ref 0.20–1.20)
Total Protein: 7.8 g/dL (ref 6.4–8.3)

## 2015-04-27 LAB — CA 125: CA 125: 12 U/mL (ref <35)

## 2015-04-27 MED ORDER — IOHEXOL 300 MG/ML  SOLN
100.0000 mL | Freq: Once | INTRAMUSCULAR | Status: AC | PRN
  Administered 2015-04-27: 100 mL via INTRAVENOUS

## 2015-05-01 ENCOUNTER — Encounter: Payer: Self-pay | Admitting: Gynecologic Oncology

## 2015-05-01 ENCOUNTER — Ambulatory Visit: Payer: Medicaid Other | Attending: Gynecologic Oncology | Admitting: Gynecologic Oncology

## 2015-05-01 VITALS — BP 152/95 | HR 64 | Temp 97.9°F | Resp 18 | Ht 65.0 in | Wt 220.0 lb

## 2015-05-01 DIAGNOSIS — C569 Malignant neoplasm of unspecified ovary: Secondary | ICD-10-CM | POA: Diagnosis present

## 2015-05-01 DIAGNOSIS — K439 Ventral hernia without obstruction or gangrene: Secondary | ICD-10-CM | POA: Diagnosis not present

## 2015-05-01 NOTE — Patient Instructions (Addendum)
Plan to meet with Dr. Rolm Bookbinder, General Surgeon at [facility] on December 9 with 8:40am arrival.  Their address is [address], [place], [place] [address], phone # [phone].  We will plan to do a joint procedure including ventral hernia repair, resection of peritoneal nodule with possible bowel resection.  Please call for any questions or concerns.

## 2015-05-01 NOTE — Progress Notes (Signed)
POST-TREATMENT FOLLOWUP. TREATMENT COUNSELING.  Assessment:    49 y.o. 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   S/p BSO, omentectomy, appendectomy on 07/28/14. S/p adjuvant chemotherapy with 6 cycles of paclitaxel and carboplatin completed on 12/08/14.  She has apparent recurrence with a 1-2cm lesion in the small bowel mesentery in close approximation with a ventral hernia. Her lung nodules are stable and this appears to be the only focus of disease recurrence.  Plan: 1) CT results reviewed today 2) Treatment counseling -  Recommend surgery - resection of mesenteric mass, possible bowel resection and hernia repair (with the assistance of General Surgery). This tumor is not very chemosensitive, and is best treated with surgical resection if amenable and if localized in recurrence. She is at high risk for surgical complications given her obesity (BMI 36) and prior surgeries. I will have her meet with general surgery and we will discuss a coordinated surgical effort.  HPI:  Danashia Bohley is a 50 y.o. year old initially seen in consultation on 07/08/14 for bilateral ovarian masses and ascites. This was diagnosed at the time of admission to the hospital for hypertensive crisis. This diagnosis of hypertension was new and she was newly started on a medical regimen for this. She then underwent an exploratory laparotomy, BSO, omentectomy, appendectomy on 07/28/14 with Dr Skeet Latch without complications.  Her postoperative course was uncomplicated.  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 This represented a stage IIIc disease process.  She received 6 cycles of adjuvant chemotherapy with paclitaxel and carboplatin with Dr Marko Plume on 08/25/14 to 12/08/14.  Pretreatment CA 125 was 58 on 06/30/14, and was 20 on 12/22/14 (it's nadir had been 16 on 09/22/14).  Post treatment CT of the  chest, abdomen and pelvis on 01/12/15 revealed: Previously noted paratracheal, subcarinal and prevascular mediastinal lymphadenopathy has resolved, with no residual pathologically enlarged mediastinal lymph nodes. Resolved hilar lymphadenopathy, with no residual pathologically enlarged mediastinal nodes. The previously described 3 mm subpleural left lower lobe pulmonary nodule is decreased to 2 mm (series 4/image 41). A separate 2 mm subpleural left lower lobe pulmonary nodule (4/29) is slightly decreased from 43mm on 06/28/2014. A subpleural 3 mm right upper lobe pulmonary nodule (4/15) is unchanged from 06/28/2014. A 2 mm subpleural pulmonary nodule in the superior segment right lower lobe associated with the right major fissure (4/21) appears new. A 0.7 cm right posterior mesenteric node measures 0.7 cm short axis (series 2/image 93), decreased from 1.2 cm. No definite peritoneal tumor implants detected. A 0.4 cm nodule anterior/inferior to the spleen (series 2/ image 60) is mildly decreased from 0.7 cm. Small umbilical hernia containing a small bowel loop, with no evidence of small-bowel obstruction or strangulation.  Interval Hx: she is doing well post treatment with residual symptoms of neuropathy in the feet that is stable. She denies cough. She denies abdominal symptoms other than a minimally painful bulge in the mid abdomen.  She had a CA 125 on 03/27/15 that was normal and stable at 14.  A CT of the chest, abdomen and pelvis was performed on 04/27/15 which showed: No pulmonary mass, infiltrate, or effusion. Tiny less than 5 mm subpleural pulmonary nodules in the right upper lobe on image remain stable. No new or enlarging pulmonary nodules identified. Small left paraventral hernia containing bowel loops. New soft tissue nodularity is seen within the anterior abdominal mesenteric fat, with largest nodule measuring  10 x 17 mm. No other sites of peritoneal nodularity or soft tissue density seen.    Current Outpatient Prescriptions on File Prior to Visit  Medication Sig Dispense Refill  . acetaminophen (TYLENOL) 500 MG tablet Take 1,000 mg by mouth every 6 (six) hours as needed for mild pain or headache.    Marland Kitchen amLODipine (NORVASC) 10 MG tablet TAKE 1 TABLET BY MOUTH DAILY 30 tablet 3  . atorvastatin (LIPITOR) 20 MG tablet Take 1 tablet (20 mg total) by mouth daily. 90 tablet 3  . lisinopril-hydrochlorothiazide (PRINZIDE,ZESTORETIC) 10-12.5 MG per tablet Take 1 tablet by mouth daily. 90 tablet 3  . metoprolol tartrate (LOPRESSOR) 25 MG tablet TAKE 1/2 TABLET BY MOUTH 2 TIMES A DAY 30 tablet 2   No current facility-administered medications on file prior to visit.   Allergies  Allergen Reactions  . Emend [Aprepitant] Other (See Comments)    Urticaria    Past Medical History  Diagnosis Date  . Hypertension   . Glucagonoma 07/28/14    Pt denies this but states she has glaucoma  . Shortness of breath dyspnea     hx of 2013 - no problems currently   . Glaucoma 02/16  . Family history of colon cancer   . Peritoneal carcinomatosis [facility])    Past Surgical History  Procedure Laterality Date  . Abdominal hysterectomy    . Thyroidectomy, partial    . Robotic assisted total hysterectomy with bilateral salpingo oopherectomy Bilateral 07/28/2014    Procedure: ROBOTIC ASSISTED lysis of adhesions with biopsies, converted to LAPAROTOMY, bilateral salpingoorphorectomy, omentectomy,appendectomy;  Surgeon: Janie Morning, MD;  Location: [facility];  Service: Gynecology;  Laterality: Bilateral;   Family History  Problem Relation Age of Onset  . Hypertension Mother   . Hypertension Father   . Diabetes Father   . Cancer Sister 50    fibrosarcoma (back); currently 30  . Prostate cancer Maternal Uncle   . Colon cancer Paternal Aunt     Dx 18s; deceased 90s  . Prostate cancer Paternal Uncle     currently 49  . Cancer Paternal Uncle 54    "bone" ; unk. primary  . Stomach cancer Paternal Uncle     Social History   Social History  . Marital Status: Married    Spouse Name: N/A  . Number of Children: N/A  . Years of Education: N/A   Occupational History  . Not on file.   Social History Main Topics  . Smoking status: Never Smoker   . Smokeless tobacco: Never Used  . Alcohol Use: No  . Drug Use: No  . Sexual Activity: Not on file   Other Topics Concern  . Not on file   Social History Narrative    Review of systems: Constitutional:  She has no weight gain or weight loss. She has no fever or chills. Eyes: No blurred vision Ears, Nose, Mouth, Throat: No dizziness, headaches or changes in hearing. No mouth sores. Cardiovascular: No chest pain, palpitations or edema. Respiratory:  No shortness of breath, wheezing or cough Gastrointestinal: She has normal bowel movements without diarrhea or constipation. She denies any nausea or vomiting. She denies blood in her stool or heart burn. Genitourinary:  She denies pelvic pain, pelvic pressure or changes in her urinary function. She has no hematuria, dysuria, or incontinence. She has no irregular vaginal bleeding or vaginal discharge Musculoskeletal: Denies muscle weakness or joint pains.  Skin:  She has no skin changes, rashes or itching Neurological:  Denies dizziness  or headaches. + bilateral foot neuropathy Psychiatric:  She denies depression or anxiety. Hematologic/Lymphatic:   No easy bruising or bleeding   Physical Exam: Blood pressure 152/95, pulse 64, temperature 97.9 F (36.6 C), temperature source Oral, resp. rate 18, height 5\' 5"  (1.651 m), weight 220 lb (99.791 kg), SpO2 98 %. General: Well dressed, well nourished in no apparent distress.   HEENT:  Normocephalic and atraumatic, no lesions.  Extraocular muscles intact. Sclerae anicteric. Pupils equal, round, reactive. No mouth sores or ulcers. Thyroid is normal size, not nodular, midline. Skin:  No lesions or rashes. Breasts:  Soft, symmetric.  No skin or nipple  changes.  No palpable LN or masses. Lungs:  Clear to auscultation bilaterally.  No wheezes. Cardiovascular:  Regular rate and rhythm.  No murmurs or rubs. Abdomen:  Soft, nontender, nondistended.  No palpable masses.  No hepatosplenomegaly.  No ascites. Normal bowel sounds.  No hernias.  Incision is healed. Left periumbilical hernia measuring approximately 5cm is palpable, soft and reducible. No masses appreciated. Genitourinary: Normal EGBUS  Vaginal cuff intact.  No bleeding or discharge.  No cul de sac fullness. Extremities: No cyanosis, clubbing or edema.  No calf tenderness or erythema. No palpable cords. Psychiatric: Mood and affect are appropriate. Neurological: Awake, alert and oriented x 3. Sensation is intact, no neuropathy.  Musculoskeletal: No pain, normal strength and range of motion.  Donaciano Eva, MD

## 2015-05-26 ENCOUNTER — Other Ambulatory Visit: Payer: Self-pay | Admitting: Internal Medicine

## 2015-05-29 ENCOUNTER — Telehealth: Payer: Self-pay | Admitting: Gynecologic Oncology

## 2015-05-29 NOTE — Telephone Encounter (Signed)
Error

## 2015-05-29 NOTE — Telephone Encounter (Signed)
Called and spoke with the patient about upcoming surgery with Dr. Donne Hazel and Dr. [name].  Advised that the case could be done jointly on Jan 26.  Advised she would receive a phone call from the hospital for her to come in for a pre-op appt.  Patient does not need to be seen in [place] before surgery per Dr. [name].  Advised to call for any questions or concerns. OR Case# [id number]

## 2015-05-31 ENCOUNTER — Telehealth: Payer: Self-pay | Admitting: Internal Medicine

## 2015-05-31 NOTE — Telephone Encounter (Signed)
Patient called requesting a medication refill for Metoprolol Please follow up.

## 2015-06-01 NOTE — Telephone Encounter (Signed)
Patient called requesting a medication refill for Metoprolol

## 2015-06-02 ENCOUNTER — Other Ambulatory Visit: Payer: Self-pay | Admitting: *Deleted

## 2015-06-02 DIAGNOSIS — I10 Essential (primary) hypertension: Secondary | ICD-10-CM

## 2015-06-02 NOTE — Telephone Encounter (Signed)
Medical Assistant left message on patient's home and cell voicemail. Voicemail states to give a call back to [place] with [facility] at [phone].   Please inform patient of needing to schedule an appointment in order for medications to be refilled here at the office.

## 2015-06-07 ENCOUNTER — Other Ambulatory Visit: Payer: Self-pay | Admitting: Internal Medicine

## 2015-06-11 ENCOUNTER — Other Ambulatory Visit: Payer: Self-pay | Admitting: Oncology

## 2015-06-13 MED ORDER — METOPROLOL TARTRATE 25 MG PO TABS: ORAL_TABLET | ORAL | Status: DC

## 2015-06-18 ENCOUNTER — Telehealth: Payer: Self-pay | Admitting: Oncology

## 2015-06-18 NOTE — Telephone Encounter (Signed)
Spoke with patient and confirmed appointment changed - 1/23 lab/fu cx - patient will return for lab/fu 4/24.

## 2015-06-29 ENCOUNTER — Other Ambulatory Visit: Payer: Self-pay | Admitting: General Surgery

## 2015-07-03 ENCOUNTER — Other Ambulatory Visit: Payer: Self-pay | Admitting: General Surgery

## 2015-07-03 ENCOUNTER — Ambulatory Visit: Payer: Medicaid Other | Admitting: Oncology

## 2015-07-03 ENCOUNTER — Other Ambulatory Visit: Payer: Medicaid Other

## 2015-07-03 ENCOUNTER — Encounter (HOSPITAL_COMMUNITY)
Admission: RE | Admit: 2015-07-03 | Discharge: 2015-07-03 | Disposition: A | Discharge status: Home / Self Care | Payer: Medicaid Other | Source: Ambulatory Visit | Attending: Gynecologic Oncology | Admitting: Gynecologic Oncology

## 2015-07-03 ENCOUNTER — Encounter (HOSPITAL_COMMUNITY): Payer: Self-pay

## 2015-07-03 DIAGNOSIS — Z01812 Encounter for preprocedural laboratory examination: Secondary | ICD-10-CM | POA: Insufficient documentation

## 2015-07-03 DIAGNOSIS — C569 Malignant neoplasm of unspecified ovary: Secondary | ICD-10-CM | POA: Diagnosis not present

## 2015-07-03 DIAGNOSIS — Z0183 Encounter for blood typing: Secondary | ICD-10-CM | POA: Diagnosis not present

## 2015-07-03 HISTORY — DX: Personal history of antineoplastic chemotherapy: Z92.21

## 2015-07-03 HISTORY — DX: Unspecified osteoarthritis, unspecified site: M19.90

## 2015-07-03 HISTORY — DX: Polyneuropathy, unspecified: G62.9

## 2015-07-03 HISTORY — DX: Nontoxic single thyroid nodule: E04.1

## 2015-07-03 HISTORY — DX: Other abnormalities of gait and mobility: R26.89

## 2015-07-03 HISTORY — DX: Presence of spectacles and contact lenses: Z97.3

## 2015-07-03 HISTORY — DX: Nocturia: R35.1

## 2015-07-03 LAB — COMPREHENSIVE METABOLIC PANEL
ALBUMIN: 4 g/dL (ref 3.5–5.0)
ALT: 36 U/L (ref 14–54)
AST: 53 U/L — AB (ref 15–41)
Alkaline Phosphatase: 77 U/L (ref 38–126)
Anion gap: 12 (ref 5–15)
BILIRUBIN TOTAL: 0.5 mg/dL (ref 0.3–1.2)
BUN: 18 mg/dL (ref 6–20)
CO2: 26 mmol/L (ref 22–32)
CREATININE: 0.96 mg/dL (ref 0.44–1.00)
Calcium: 9.5 mg/dL (ref 8.9–10.3)
Chloride: 100 mmol/L — ABNORMAL LOW (ref 101–111)
GFR calc non Af Amer: >60 mL/min (ref >60)
Glucose, Bld: 233 mg/dL — ABNORMAL HIGH (ref 65–99)
Potassium: 4.2 mmol/L (ref 3.5–5.1)
SODIUM: 138 mmol/L (ref 135–145)
TOTAL PROTEIN: 7.7 g/dL (ref 6.5–8.1)

## 2015-07-03 LAB — CBC WITH DIFFERENTIAL/PLATELET
BASOS ABS: 0 10*3/uL (ref 0.0–0.1)
BASOS PCT: 0 %
EOS ABS: 0.1 10*3/uL (ref 0.0–0.7)
Eosinophils Relative: 1 %
HCT: 36.8 % (ref 36.0–46.0)
Hemoglobin: 12.1 g/dL (ref 12.0–15.0)
Lymphocytes Relative: 42 %
Lymphs Abs: 2.9 10*3/uL (ref 0.7–4.0)
MCH: 30.1 pg (ref 26.0–34.0)
MCHC: 32.9 g/dL (ref 30.0–36.0)
MCV: 91.5 fL (ref 78.0–100.0)
MONO ABS: 0.6 10*3/uL (ref 0.1–1.0)
MONOS PCT: 8 %
Neutro Abs: 3.4 10*3/uL (ref 1.7–7.7)
Neutrophils Relative %: 49 %
PLATELETS: 265 10*3/uL (ref 150–400)
RBC: 4.02 MIL/uL (ref 3.87–5.11)
RDW: 13.7 % (ref 11.5–15.5)
WBC: 6.9 10*3/uL (ref 4.0–10.5)

## 2015-07-03 MED FILL — AMLODIPINE BESYLATE 10 MG T: 10 | 30 days supply | Qty: 30 | Fill #4

## 2015-07-03 MED FILL — ATORVASTATIN 20 MG TABLET: 20 | 30 days supply | Qty: 30 | Fill #10

## 2015-07-03 MED FILL — LISINOPRIL-HCTZ 10-12.5 MG: 10-12.5 | 30 days supply | Qty: 30 | Fill #11

## 2015-07-03 MED FILL — METOPROLOL TARTRATE 25 MG T: 25 | 30 days supply | Qty: 30 | Fill #0

## 2015-07-03 NOTE — Progress Notes (Addendum)
CT chest epic 04/17/2015 EKG 07/30/2014/epic had Dr Tresa Moore with anesthesia review EKG's per epic 06/28/2014 and 07/30/2014. No orders given. Anesthesia to see pt day of surgery.

## 2015-07-03 NOTE — Patient Instructions (Addendum)
Ruey Reigel  07/03/2015   Your procedure is scheduled on: Thursday July 06, 2015   Report to [facility]  Entrance take [place]  elevators to 3rd floor to  [place] at 7:00 AM.  Call this number if you have problems the morning of surgery [phone]   Remember: ONLY 1 PERSON MAY GO WITH YOU TO SHORT STAY TO GET  READY MORNING OF [place].  Do not eat food or drink liquids :After Midnight. CLEAR DIET DAY PRIOR TO SURGICAL DATE. NO CARBONATED BEVERAGES.      Take these medicines the morning of surgery with A SIP OF WATER: Amolodipine (Norvasc); Metoprolol                                You may not have any metal on your body including hair pins and              piercings  Do not wear jewelry, make-up, lotions, powders or perfumes, deodorant             Do not wear nail polish.  Do not shave  48 hours prior to surgery.              Do not bring valuables to the hospital. [place].  Contacts, dentures or bridgework may not be worn into surgery.  Leave suitcase in the car. After surgery it may be brought to your room.     Special Instructions: CONTACT SURGEON'S OFFICE IN REGARDS TO ANY NEED FOR BOWEL PREPARATION PRIOR TO SURGICAL PROCEDURE DATE              Please read over the following fact sheets you were given:INCENTIVE SPIROMETER; BLOOD TRANSFUSION INFORMATION SHEET _____________________________________________________________________             [facility] - Preparing for Surgery Before surgery, you can play an important role.  Because skin is not sterile, your skin needs to be as free of germs as possible.  You can reduce the number of germs on your skin by washing with CHG (chlorahexidine gluconate) soap before surgery.  CHG is an antiseptic cleaner which kills germs and bonds with the skin to continue killing germs even after washing. Please DO NOT use if you have an allergy to CHG  or antibacterial soaps.  If your skin becomes reddened/irritated stop using the CHG and inform your nurse when you arrive at Short Stay. Do not shave (including legs and underarms) for at least 48 hours prior to the first CHG shower.  You may shave your face/neck. Please follow these instructions carefully:  1.  Shower with CHG Soap the night before surgery and the  morning of Surgery.  2.  If you choose to wash your hair, wash your hair first as usual with your  normal  shampoo.  3.  After you shampoo, rinse your hair and body thoroughly to remove the  shampoo.                           4.  Use CHG as you would any other liquid soap.  You can apply chg directly  to the skin and wash  Gently with a scrungie or clean washcloth.  5.  Apply the CHG Soap to your body ONLY FROM THE NECK DOWN.   Do not use on face/ open                           Wound or open sores. Avoid contact with eyes, ears mouth and genitals (private parts).                       Wash face,  Genitals (private parts) with your normal soap.             6.  Wash thoroughly, paying special attention to the area where your surgery  will be performed.  7.  Thoroughly rinse your body with warm water from the neck down.  8.  DO NOT shower/wash with your normal soap after using and rinsing off  the CHG Soap.                9.  Pat yourself dry with a clean towel.            10.  Wear clean pajamas.            11.  Place clean sheets on your bed the night of your first shower and do not  sleep with pets. Day of Surgery : Do not apply any lotions/deodorants the morning of surgery.  Please wear clean clothes to the hospital/surgery center.  FAILURE TO FOLLOW THESE INSTRUCTIONS MAY RESULT IN THE CANCELLATION OF YOUR SURGERY PATIENT SIGNATURE_________________________________  NURSE SIGNATURE__________________________________  ________________________________________________________________________   Adam Phenix  An incentive spirometer is a tool that can help keep your lungs clear and active. This tool measures how well you are filling your lungs with each breath. Taking long deep breaths may help reverse or decrease the chance of developing breathing (pulmonary) problems (especially infection) following:  A long period of time when you are unable to move or be active. BEFORE THE PROCEDURE   If the spirometer includes an indicator to show your best effort, your nurse or respiratory therapist will set it to a desired goal.  If possible, sit up straight or lean slightly forward. Try not to slouch.  Hold the incentive spirometer in an upright position. INSTRUCTIONS FOR USE   Sit on the edge of your bed if possible, or sit up as far as you can in bed or on a chair.  Hold the incentive spirometer in an upright position.  Breathe out normally.  Place the mouthpiece in your mouth and seal your lips tightly around it.  Breathe in slowly and as deeply as possible, raising the piston or the ball toward the top of the column.  Hold your breath for 3-5 seconds or for as long as possible. Allow the piston or ball to fall to the bottom of the column.  Remove the mouthpiece from your mouth and breathe out normally.  Rest for a few seconds and repeat Steps 1 through 7 at least 10 times every 1-2 hours when you are awake. Take your time and take a few normal breaths between deep breaths.  The spirometer may include an indicator to show your best effort. Use the indicator as a goal to work toward during each repetition.  After each set of 10 deep breaths, practice coughing to be sure your lungs are clear. If you have an incision (the cut made at the time of surgery),  support your incision when coughing by placing a pillow or rolled up towels firmly against it. Once you are able to get out of bed, walk around indoors and cough well. You may stop using the incentive spirometer when instructed by  your caregiver.  RISKS AND COMPLICATIONS  Take your time so you do not get dizzy or light-headed.  If you are in pain, you may need to take or ask for pain medication before doing incentive spirometry. It is harder to take a deep breath if you are having pain. AFTER USE  Rest and breathe slowly and easily.  It can be helpful to keep track of a log of your progress. Your caregiver can provide you with a simple table to help with this. If you are using the spirometer at home, follow these instructions: [place] IF:   You are having difficultly using the spirometer.  You have trouble using the spirometer as often as instructed.  Your pain medication is not giving enough relief while using the spirometer.  You develop fever of 100.5 F (38.1 C) or higher. SEEK IMMEDIATE MEDICAL CARE IF:   You cough up bloody sputum that had not been present before.  You develop fever of 102 F (38.9 C) or greater.  You develop worsening pain at or near the incision site. MAKE SURE YOU:   Understand these instructions.  Will watch your condition.  Will get help right away if you are not doing well or get worse. Document Released: 10/07/2006 Document Revised: 08/19/2011 Document Reviewed: 12/08/2006 ExitCare Patient Information 2014 ExitCare, [place].   ________________________________________________________________________  WHAT IS A BLOOD TRANSFUSION? Blood Transfusion Information  A transfusion is the replacement of blood or some of its parts. Blood is made up of multiple cells which provide different functions.  Red blood cells carry oxygen and are used for blood loss replacement.  White blood cells fight against infection.  Platelets control bleeding.  Plasma helps clot blood.  Other blood products are available for specialized needs, such as hemophilia or other clotting disorders. BEFORE THE TRANSFUSION  Who gives blood for transfusions?   Healthy volunteers who are  fully evaluated to make sure their blood is safe. This is blood bank blood. Transfusion therapy is the safest it has ever been in the practice of medicine. Before blood is taken from a donor, a complete history is taken to make sure that person has no history of diseases nor engages in risky social behavior (examples are intravenous drug use or sexual activity with multiple partners). The donor's travel history is screened to minimize risk of transmitting infections, such as malaria. The donated blood is tested for signs of infectious diseases, such as HIV and hepatitis. The blood is then tested to be sure it is compatible with you in order to minimize the chance of a transfusion reaction. If you or a relative donates blood, this is often done in anticipation of surgery and is not appropriate for emergency situations. It takes many days to process the donated blood. RISKS AND COMPLICATIONS Although transfusion therapy is very safe and saves many lives, the main dangers of transfusion include:   Getting an infectious disease.  Developing a transfusion reaction. This is an allergic reaction to something in the blood you were given. Every precaution is taken to prevent this. The decision to have a blood transfusion has been considered carefully by your caregiver before blood is given. Blood is not given unless the benefits outweigh the risks. AFTER THE TRANSFUSION  Right after receiving a blood transfusion, you will usually feel much better and more energetic. This is especially true if your red blood cells have gotten low (anemic). The transfusion raises the level of the red blood cells which carry oxygen, and this usually causes an energy increase.  The nurse administering the transfusion will monitor you carefully for complications. HOME CARE INSTRUCTIONS  No special instructions are needed after a transfusion. You may find your energy is better. Speak with your caregiver about any limitations on  activity for underlying diseases you may have. SEEK MEDICAL CARE IF:   Your condition is not improving after your transfusion.  You develop redness or irritation at the intravenous (IV) site. SEEK IMMEDIATE MEDICAL CARE IF:  Any of the following symptoms occur over the next 12 hours:  Shaking chills.  You have a temperature by mouth above 102 F (38.9 C), not controlled by medicine.  Chest, back, or muscle pain.  People around you feel you are not acting correctly or are confused.  Shortness of breath or difficulty breathing.  Dizziness and fainting.  You get a rash or develop hives.  You have a decrease in urine output.  Your urine turns a dark color or changes to pink, red, or brown. Any of the following symptoms occur over the next 10 days:  You have a temperature by mouth above 102 F (38.9 C), not controlled by medicine.  Shortness of breath.  Weakness after normal activity.  The white part of the eye turns yellow (jaundice).  You have a decrease in the amount of urine or are urinating less often.  Your urine turns a dark color or changes to pink, red, or brown. Document Released: 05/24/2000 Document Revised: 08/19/2011 Document Reviewed: 01/11/2008 ExitCare Patient Information 2014 ExitCare, [place].  _______________________________________________________________________    CLEAR LIQUID DIET   Foods Allowed                                                                     Foods Excluded  Coffee and tea, regular and decaf                             liquids that you cannot  Plain Jell-O in any flavor                                             see through such as: Fruit ices (not with fruit pulp)                                     milk, soups, orange juice  Iced Popsicles                                    All solid food                                Cranberry,  grape and apple juices Sports drinks like Gatorade Lightly seasoned clear broth or  consume(fat free) Sugar, honey syrup  Sample Menu Breakfast                                Lunch                                     Supper Cranberry juice                    Beef broth                            Chicken broth Jell-O                                     Grape juice                           Apple juice Coffee or tea                        Jell-O                                      Popsicle                                                Coffee or tea                        Coffee or tea  _____________________________________________________________________

## 2015-07-04 ENCOUNTER — Encounter (HOSPITAL_COMMUNITY): Payer: Self-pay

## 2015-07-04 NOTE — Progress Notes (Signed)
Your patient has screened at an elevated risk for Obstructive Sleep Apnea using the Stop-Bang Tool during a pre-surgical visit. Pt scored at high risk.  

## 2015-07-04 NOTE — Progress Notes (Signed)
CMP results in epic per PAT visit 07/03/2015 sent to Dr [name]

## 2015-07-05 NOTE — Anesthesia Preprocedure Evaluation (Addendum)
Anesthesia Evaluation  Patient identified by MRN, date of birth, ID band Patient awake    Reviewed: Allergy & Precautions, H&P , NPO status , Patient's Chart, lab work & pertinent test results, reviewed documented beta blocker date and time   Airway Mallampati: II  TM Distance: >3 FB Neck ROM: full    Dental  (+) Chipped, Dental Advisory Given Chipped tooth right upper front:   Pulmonary shortness of breath and with exertion,    Pulmonary exam normal breath sounds clear to auscultation       Cardiovascular Exercise Tolerance: Good hypertension, Pt. on home beta blockers and Pt. 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  Renal/GU negative Renal ROS   Ovarian CA SP oophorectomy, omentectomy and Chemo, has recurrent mesenteric mass, also hernia    Musculoskeletal   Abdominal (+) + obese,   Peds  Hematology negative hematology ROS (+) 12/36   Anesthesia Other Findings   Reproductive/Obstetrics negative OB ROS                            Anesthesia Physical  Anesthesia Plan  ASA: III  Anesthesia Plan: General   Post-op Pain Management:    Induction: Intravenous  Airway Management Planned: Oral ETT  Additional Equipment:   Intra-op Plan:   Post-operative Plan: Extubation in OR  Informed Consent: I have reviewed the patients History and Physical, chart, labs and discussed the procedure including the risks, benefits and alternatives for the proposed anesthesia with the patient or authorized representative who has indicated his/her understanding and acceptance.   Dental Advisory Given  Plan Discussed with: CRNA and Surgeon  Anesthesia Plan Comments:          Anesthesia Quick Evaluation

## 2015-07-05 NOTE — Progress Notes (Signed)
Pt notified of surgical time change. Aware to arrive at Short Stay at 7:30 am 07/06/2015.

## 2015-07-06 ENCOUNTER — Encounter (HOSPITAL_COMMUNITY): Payer: Self-pay | Admitting: *Deleted

## 2015-07-06 ENCOUNTER — Inpatient Hospital Stay (HOSPITAL_COMMUNITY): Payer: Medicaid Other | Admitting: Anesthesiology

## 2015-07-06 ENCOUNTER — Encounter (HOSPITAL_COMMUNITY)
Admission: RE | Disposition: A | Discharge status: Home / Self Care | Payer: Self-pay | Source: Ambulatory Visit | Attending: Gynecologic Oncology

## 2015-07-06 ENCOUNTER — Inpatient Hospital Stay (HOSPITAL_COMMUNITY)
Admission: RE | Admit: 2015-07-06 | Discharge: 2015-07-12 | DRG: 749 | Disposition: A | Discharge status: Home / Self Care | Payer: Medicaid Other | Source: Ambulatory Visit | Attending: Gynecologic Oncology | Admitting: Gynecologic Oncology

## 2015-07-06 DIAGNOSIS — N289 Disorder of kidney and ureter, unspecified: Secondary | ICD-10-CM | POA: Diagnosis not present

## 2015-07-06 DIAGNOSIS — I10 Essential (primary) hypertension: Secondary | ICD-10-CM | POA: Diagnosis present

## 2015-07-06 DIAGNOSIS — C569 Malignant neoplasm of unspecified ovary: Principal | ICD-10-CM

## 2015-07-06 DIAGNOSIS — Z90710 Acquired absence of both cervix and uterus: Secondary | ICD-10-CM

## 2015-07-06 DIAGNOSIS — K660 Peritoneal adhesions (postprocedural) (postinfection): Secondary | ICD-10-CM | POA: Diagnosis present

## 2015-07-06 DIAGNOSIS — Z79899 Other long term (current) drug therapy: Secondary | ICD-10-CM | POA: Diagnosis not present

## 2015-07-06 DIAGNOSIS — H409 Unspecified glaucoma: Secondary | ICD-10-CM | POA: Diagnosis present

## 2015-07-06 DIAGNOSIS — Z6837 Body mass index (BMI) 37.0-37.9, adult: Secondary | ICD-10-CM

## 2015-07-06 DIAGNOSIS — Z9889 Other specified postprocedural states: Secondary | ICD-10-CM

## 2015-07-06 DIAGNOSIS — Z888 Allergy status to other drugs, medicaments and biological substances status: Secondary | ICD-10-CM | POA: Diagnosis not present

## 2015-07-06 DIAGNOSIS — E1165 Type 2 diabetes mellitus with hyperglycemia: Secondary | ICD-10-CM | POA: Diagnosis present

## 2015-07-06 DIAGNOSIS — Z8249 Family history of ischemic heart disease and other diseases of the circulatory system: Secondary | ICD-10-CM

## 2015-07-06 DIAGNOSIS — R918 Other nonspecific abnormal finding of lung field: Secondary | ICD-10-CM | POA: Diagnosis present

## 2015-07-06 DIAGNOSIS — C482 Malignant neoplasm of peritoneum, unspecified: Secondary | ICD-10-CM | POA: Diagnosis present

## 2015-07-06 DIAGNOSIS — Z01812 Encounter for preprocedural laboratory examination: Secondary | ICD-10-CM | POA: Diagnosis not present

## 2015-07-06 DIAGNOSIS — K9189 Other postprocedural complications and disorders of digestive system: Secondary | ICD-10-CM | POA: Diagnosis not present

## 2015-07-06 DIAGNOSIS — K432 Incisional hernia without obstruction or gangrene: Secondary | ICD-10-CM | POA: Diagnosis present

## 2015-07-06 DIAGNOSIS — Z833 Family history of diabetes mellitus: Secondary | ICD-10-CM

## 2015-07-06 DIAGNOSIS — Z8261 Family history of arthritis: Secondary | ICD-10-CM | POA: Diagnosis not present

## 2015-07-06 DIAGNOSIS — C786 Secondary malignant neoplasm of retroperitoneum and peritoneum: Secondary | ICD-10-CM | POA: Diagnosis present

## 2015-07-06 DIAGNOSIS — Z823 Family history of stroke: Secondary | ICD-10-CM

## 2015-07-06 DIAGNOSIS — Z800 Family history of malignant neoplasm of digestive organs: Secondary | ICD-10-CM | POA: Diagnosis not present

## 2015-07-06 DIAGNOSIS — K567 Ileus, unspecified: Secondary | ICD-10-CM

## 2015-07-06 DIAGNOSIS — E669 Obesity, unspecified: Secondary | ICD-10-CM | POA: Diagnosis present

## 2015-07-06 DIAGNOSIS — R112 Nausea with vomiting, unspecified: Secondary | ICD-10-CM

## 2015-07-06 DIAGNOSIS — R739 Hyperglycemia, unspecified: Secondary | ICD-10-CM

## 2015-07-06 DIAGNOSIS — C561 Malignant neoplasm of right ovary: Secondary | ICD-10-CM

## 2015-07-06 HISTORY — PX: INCISIONAL HERNIA REPAIR: SHX193

## 2015-07-06 HISTORY — PX: INSERTION OF MESH: SHX5868

## 2015-07-06 HISTORY — PX: LAPAROTOMY: SHX154

## 2015-07-06 HISTORY — PX: LYSIS OF ADHESION: SHX5961

## 2015-07-06 LAB — GLUCOSE, CAPILLARY
GLUCOSE-CAPILLARY: 392 mg/dL — AB (ref 65–99)
GLUCOSE-CAPILLARY: 323 mg/dL — AB (ref 65–99)
Glucose-Capillary: 262 mg/dL — ABNORMAL HIGH (ref 65–99)

## 2015-07-06 LAB — TYPE AND SCREEN
ABO/RH(D): O POS
ANTIBODY SCREEN: NEGATIVE

## 2015-07-06 SURGERY — LAPAROTOMY, EXPLORATORY
Anesthesia: General

## 2015-07-06 MED ORDER — LISINOPRIL-HYDROCHLOROTHIAZIDE 10-12.5 MG PO TABS: 1.0000 | ORAL_TABLET | Freq: Every day | ORAL | Status: DC

## 2015-07-06 MED ORDER — POTASSIUM CHLORIDE IN NACL 20-0.45 MEQ/L-% IV SOLN
INTRAVENOUS | Status: DC
  Administered 2015-07-06 – 2015-07-09 (×4): via INTRAVENOUS
  Filled 2015-07-06 (×10): qty 1000

## 2015-07-06 MED ORDER — LATANOPROST 0.005 % OP SOLN
1.0000 [drp] | Freq: Every day | OPHTHALMIC | Status: DC
  Administered 2015-07-06 – 2015-07-11 (×6): 1 [drp] via OPHTHALMIC
  Filled 2015-07-06: qty 2.5

## 2015-07-06 MED ORDER — DEXAMETHASONE SODIUM PHOSPHATE 10 MG/ML IJ SOLN
INTRAMUSCULAR | Status: DC | PRN
  Administered 2015-07-06: 10 mg via INTRAVENOUS

## 2015-07-06 MED ORDER — ENOXAPARIN SODIUM 40 MG/0.4ML ~~LOC~~ SOLN
40.0000 mg | SUBCUTANEOUS | Status: DC
Start: 2015-07-07 — End: 2015-07-12
  Administered 2015-07-07 – 2015-07-12 (×6): 40 mg via SUBCUTANEOUS
  Filled 2015-07-06 (×7): qty 0.4

## 2015-07-06 MED ORDER — LIDOCAINE HCL (CARDIAC) 20 MG/ML IV SOLN
INTRAVENOUS | Status: AC
  Filled 2015-07-06: qty 5

## 2015-07-06 MED ORDER — PHENYLEPHRINE HCL 10 MG/ML IJ SOLN
INTRAMUSCULAR | Status: DC | PRN
  Administered 2015-07-06: 80 ug via INTRAVENOUS

## 2015-07-06 MED ORDER — OXYCODONE HCL 5 MG PO TABS
10.0000 mg | ORAL_TABLET | ORAL | Status: DC | PRN
  Administered 2015-07-06: 10 mg via ORAL
  Administered 2015-07-07: 5 mg via ORAL
  Filled 2015-07-06 (×2): qty 2

## 2015-07-06 MED ORDER — SUGAMMADEX SODIUM 500 MG/5ML IV SOLN
INTRAVENOUS | Status: AC
  Filled 2015-07-06: qty 5

## 2015-07-06 MED ORDER — ONDANSETRON HCL 4 MG/2ML IJ SOLN
4.0000 mg | Freq: Four times a day (QID) | INTRAMUSCULAR | Status: DC | PRN
  Administered 2015-07-07 – 2015-07-09 (×6): 4 mg via INTRAVENOUS
  Filled 2015-07-06 (×6): qty 2

## 2015-07-06 MED ORDER — ATORVASTATIN CALCIUM 20 MG PO TABS
20.0000 mg | ORAL_TABLET | Freq: Every day | ORAL | Status: DC
  Administered 2015-07-06 – 2015-07-12 (×7): 20 mg via ORAL
  Filled 2015-07-06 (×7): qty 1

## 2015-07-06 MED ORDER — SODIUM CHLORIDE 0.9 % IJ SOLN
INTRAMUSCULAR | Status: DC | PRN
  Administered 2015-07-06: 20 mL

## 2015-07-06 MED ORDER — MAGNESIUM HYDROXIDE 400 MG/5ML PO SUSP
30.0000 mL | Freq: Three times a day (TID) | ORAL | Status: AC
  Administered 2015-07-06 – 2015-07-07 (×3): 30 mL via ORAL
  Filled 2015-07-06 (×3): qty 30

## 2015-07-06 MED ORDER — DEXTROSE 5 % IV SOLN
2.0000 g | INTRAVENOUS | Status: AC
  Administered 2015-07-06 (×2): 2 g via INTRAVENOUS

## 2015-07-06 MED ORDER — PHENYLEPHRINE 40 MCG/ML (10ML) SYRINGE FOR IV PUSH (FOR BLOOD PRESSURE SUPPORT)
PREFILLED_SYRINGE | INTRAVENOUS | Status: AC
  Filled 2015-07-06: qty 10

## 2015-07-06 MED ORDER — LABETALOL HCL 5 MG/ML IV SOLN
2.5000 mg | INTRAVENOUS | Status: DC | PRN
  Administered 2015-07-06: 2.5 mg via INTRAVENOUS

## 2015-07-06 MED ORDER — SUGAMMADEX SODIUM 500 MG/5ML IV SOLN
INTRAVENOUS | Status: DC | PRN
  Administered 2015-07-06: 400 mg via INTRAVENOUS

## 2015-07-06 MED ORDER — LACTATED RINGERS IV SOLN
INTRAVENOUS | Status: DC
  Administered 2015-07-06: 14:00:00 via INTRAVENOUS

## 2015-07-06 MED ORDER — DEXTROSE 5 % IV SOLN
INTRAVENOUS | Status: AC
  Filled 2015-07-06: qty 2

## 2015-07-06 MED ORDER — FENTANYL CITRATE (PF) 250 MCG/5ML IJ SOLN
INTRAMUSCULAR | Status: AC
  Filled 2015-07-06: qty 5

## 2015-07-06 MED ORDER — MIDAZOLAM HCL 5 MG/5ML IJ SOLN
INTRAMUSCULAR | Status: DC | PRN
  Administered 2015-07-06: 2 mg via INTRAVENOUS

## 2015-07-06 MED ORDER — EPHEDRINE SULFATE 50 MG/ML IJ SOLN
INTRAMUSCULAR | Status: DC | PRN
  Administered 2015-07-06 (×2): 5 mg via INTRAVENOUS

## 2015-07-06 MED ORDER — MIDAZOLAM HCL 2 MG/2ML IJ SOLN
INTRAMUSCULAR | Status: AC
  Filled 2015-07-06: qty 2

## 2015-07-06 MED ORDER — ENOXAPARIN SODIUM 40 MG/0.4ML ~~LOC~~ SOLN
40.0000 mg | SUBCUTANEOUS | Status: AC
Start: 2015-07-06 — End: 2015-07-06
  Administered 2015-07-06: 40 mg via SUBCUTANEOUS
  Filled 2015-07-06: qty 0.4

## 2015-07-06 MED ORDER — HYDROMORPHONE HCL 1 MG/ML IJ SOLN
INTRAMUSCULAR | Status: DC | PRN
  Administered 2015-07-06 (×2): 1 mg via INTRAVENOUS

## 2015-07-06 MED ORDER — ONDANSETRON HCL 4 MG/2ML IJ SOLN
INTRAMUSCULAR | Status: DC | PRN
  Administered 2015-07-06: 4 mg via INTRAVENOUS

## 2015-07-06 MED ORDER — ONDANSETRON HCL 4 MG/2ML IJ SOLN
INTRAMUSCULAR | Status: AC
  Filled 2015-07-06: qty 2

## 2015-07-06 MED ORDER — LACTATED RINGERS IV SOLN
INTRAVENOUS | Status: DC
  Administered 2015-07-06: 1000 mL via INTRAVENOUS
  Administered 2015-07-06 (×2): via INTRAVENOUS

## 2015-07-06 MED ORDER — BUPIVACAINE LIPOSOME 1.3 % IJ SUSP: INTRAMUSCULAR | Status: DC | PRN

## 2015-07-06 MED ORDER — ROCURONIUM BROMIDE 100 MG/10ML IV SOLN
INTRAVENOUS | Status: DC | PRN
  Administered 2015-07-06: 10 mg via INTRAVENOUS
  Administered 2015-07-06: 50 mg via INTRAVENOUS
  Administered 2015-07-06 (×4): 10 mg via INTRAVENOUS

## 2015-07-06 MED ORDER — KCL IN DEXTROSE-NACL 20-5-0.45 MEQ/L-%-% IV SOLN
INTRAVENOUS | Status: DC
  Administered 2015-07-06: 17:00:00 via INTRAVENOUS
  Filled 2015-07-06: qty 1000

## 2015-07-06 MED ORDER — LACTATED RINGERS IV BOLUS (SEPSIS)
500.0000 mL | Freq: Once | INTRAVENOUS | Status: AC
  Administered 2015-07-06: 500 mL via INTRAVENOUS

## 2015-07-06 MED ORDER — SUCCINYLCHOLINE CHLORIDE 20 MG/ML IJ SOLN
INTRAMUSCULAR | Status: DC | PRN
  Administered 2015-07-06: 100 mg via INTRAVENOUS

## 2015-07-06 MED ORDER — SODIUM CHLORIDE 0.9 % IJ SOLN
INTRAMUSCULAR | Status: AC
  Filled 2015-07-06: qty 50

## 2015-07-06 MED ORDER — FENTANYL CITRATE (PF) 100 MCG/2ML IJ SOLN
INTRAMUSCULAR | Status: AC
  Filled 2015-07-06: qty 2

## 2015-07-06 MED ORDER — PROPOFOL 10 MG/ML IV BOLUS
INTRAVENOUS | Status: DC | PRN
  Administered 2015-07-06: 200 mg via INTRAVENOUS

## 2015-07-06 MED ORDER — AMLODIPINE BESYLATE 10 MG PO TABS
10.0000 mg | ORAL_TABLET | Freq: Every day | ORAL | Status: DC
  Administered 2015-07-07 – 2015-07-12 (×6): 10 mg via ORAL
  Filled 2015-07-06 (×6): qty 1

## 2015-07-06 MED ORDER — BUPIVACAINE LIPOSOME 1.3 % IJ SUSP
20.0000 mL | Freq: Once | INTRAMUSCULAR | Status: AC
  Administered 2015-07-06: 20 mL
  Filled 2015-07-06: qty 20

## 2015-07-06 MED ORDER — DEXAMETHASONE SODIUM PHOSPHATE 10 MG/ML IJ SOLN
INTRAMUSCULAR | Status: AC
  Filled 2015-07-06: qty 1

## 2015-07-06 MED ORDER — LABETALOL HCL 5 MG/ML IV SOLN
INTRAVENOUS | Status: AC
  Filled 2015-07-06: qty 4

## 2015-07-06 MED ORDER — ACETAMINOPHEN 500 MG PO TABS
1000.0000 mg | ORAL_TABLET | Freq: Four times a day (QID) | ORAL | Status: DC
  Administered 2015-07-06 – 2015-07-12 (×13): 1000 mg via ORAL
  Filled 2015-07-06 (×34): qty 2

## 2015-07-06 MED ORDER — ROCURONIUM BROMIDE 100 MG/10ML IV SOLN
INTRAVENOUS | Status: AC
  Filled 2015-07-06: qty 1

## 2015-07-06 MED ORDER — LISINOPRIL 10 MG PO TABS
10.0000 mg | ORAL_TABLET | Freq: Every day | ORAL | Status: DC
  Administered 2015-07-06 – 2015-07-12 (×7): 10 mg via ORAL
  Filled 2015-07-06 (×7): qty 1

## 2015-07-06 MED ORDER — ONDANSETRON HCL 4 MG PO TABS: 4.0000 mg | ORAL_TABLET | Freq: Four times a day (QID) | ORAL | Status: DC | PRN

## 2015-07-06 MED ORDER — INSULIN ASPART 100 UNIT/ML ~~LOC~~ SOLN
0.0000 [IU] | Freq: Three times a day (TID) | SUBCUTANEOUS | Status: DC
  Administered 2015-07-06: 20 [IU] via SUBCUTANEOUS

## 2015-07-06 MED ORDER — TRAMADOL HCL 50 MG PO TABS
100.0000 mg | ORAL_TABLET | Freq: Four times a day (QID) | ORAL | Status: DC
  Administered 2015-07-06 – 2015-07-12 (×12): 100 mg via ORAL
  Filled 2015-07-06 (×16): qty 2

## 2015-07-06 MED ORDER — METOPROLOL TARTRATE 12.5 MG HALF TABLET
12.5000 mg | ORAL_TABLET | Freq: Two times a day (BID) | ORAL | Status: DC
  Administered 2015-07-06 – 2015-07-12 (×12): 12.5 mg via ORAL
  Filled 2015-07-06 (×13): qty 1

## 2015-07-06 MED ORDER — HYDROMORPHONE HCL 1 MG/ML IJ SOLN: 0.5000 mg | INTRAMUSCULAR | Status: DC | PRN

## 2015-07-06 MED ORDER — PROPOFOL 10 MG/ML IV BOLUS
INTRAVENOUS | Status: AC
  Filled 2015-07-06: qty 20

## 2015-07-06 MED ORDER — GABAPENTIN 300 MG PO CAPS
600.0000 mg | ORAL_CAPSULE | Freq: Every day | ORAL | Status: AC
  Administered 2015-07-06: 600 mg via ORAL
  Filled 2015-07-06: qty 2

## 2015-07-06 MED ORDER — HYDROCHLOROTHIAZIDE 12.5 MG PO CAPS
12.5000 mg | ORAL_CAPSULE | Freq: Every day | ORAL | Status: DC
  Administered 2015-07-06 – 2015-07-12 (×7): 12.5 mg via ORAL
  Filled 2015-07-06 (×7): qty 1

## 2015-07-06 MED ORDER — MEPERIDINE HCL 50 MG/ML IJ SOLN: 6.2500 mg | INTRAMUSCULAR | Status: DC | PRN

## 2015-07-06 MED ORDER — CEFOXITIN SODIUM 2 G IV SOLR
INTRAVENOUS | Status: AC
  Filled 2015-07-06: qty 2

## 2015-07-06 MED ORDER — LIDOCAINE HCL (CARDIAC) 20 MG/ML IV SOLN
INTRAVENOUS | Status: DC | PRN
  Administered 2015-07-06: 100 mg via INTRAVENOUS

## 2015-07-06 MED ORDER — FENTANYL CITRATE (PF) 100 MCG/2ML IJ SOLN
INTRAMUSCULAR | Status: DC | PRN
  Administered 2015-07-06 (×3): 50 ug via INTRAVENOUS
  Administered 2015-07-06: 100 ug via INTRAVENOUS

## 2015-07-06 MED ORDER — PROMETHAZINE HCL 25 MG/ML IJ SOLN: 6.2500 mg | INTRAMUSCULAR | Status: DC | PRN

## 2015-07-06 MED ORDER — HYDROMORPHONE HCL 2 MG/ML IJ SOLN
INTRAMUSCULAR | Status: AC
  Filled 2015-07-06: qty 1

## 2015-07-06 MED ORDER — FENTANYL CITRATE (PF) 100 MCG/2ML IJ SOLN
25.0000 ug | INTRAMUSCULAR | Status: DC | PRN
  Administered 2015-07-06: 50 ug via INTRAVENOUS

## 2015-07-06 MED ORDER — ENSURE ENLIVE PO LIQD: 237.0000 mL | Freq: Two times a day (BID) | ORAL | Status: DC

## 2015-07-06 SURGICAL SUPPLY — 67 items
ATTRACTOMAT 16X20 MAGNETIC DRP (DRAPES) IMPLANT
BINDER ABDOMINAL 12 ML 46-62 (SOFTGOODS) ×2 IMPLANT
BIOPATCH WHT 1IN DISK W/4.0 H (GAUZE/BANDAGES/DRESSINGS) ×2 IMPLANT
CHLORAPREP W/TINT 26ML (MISCELLANEOUS) ×4 IMPLANT
CLIP TI LARGE 6 (CLIP) ×2 IMPLANT
CLIP TI MEDIUM 6 (CLIP) ×2 IMPLANT
CLIP TI MEDIUM LARGE 6 (CLIP) ×2 IMPLANT
CONT SPEC 4OZ CLIKSEAL STRL BL (MISCELLANEOUS) ×2 IMPLANT
COVER SURGICAL LIGHT HANDLE (MISCELLANEOUS) IMPLANT
DRAIN CHANNEL 19F RND (DRAIN) ×2 IMPLANT
DRAPE UTILITY 15X26 (DRAPE) ×2 IMPLANT
DRAPE UTILITY XL STRL (DRAPES) ×2 IMPLANT
DRAPE WARM FLUID 44X44 (DRAPE) ×2 IMPLANT
DRESSING TELFA ISLAND 4X8 (GAUZE/BANDAGES/DRESSINGS) ×2 IMPLANT
DRSG OPSITE POSTOP 4X10 (GAUZE/BANDAGES/DRESSINGS) ×2 IMPLANT
DRSG TEGADERM 4X4.75 (GAUZE/BANDAGES/DRESSINGS) ×2 IMPLANT
ELECT LIGASURE SHORT 9 REUSE (ELECTRODE) IMPLANT
ELECT REM PT RETURN 9FT ADLT (ELECTROSURGICAL) ×2
ELECTRODE REM PT RTRN 9FT ADLT (ELECTROSURGICAL) ×1 IMPLANT
GAUZE SPONGE 4X4 12PLY STRL (GAUZE/BANDAGES/DRESSINGS) IMPLANT
GAUZE SPONGE 4X4 16PLY XRAY LF (GAUZE/BANDAGES/DRESSINGS) ×2 IMPLANT
GLOVE BIO SURGEON STRL SZ 6 (GLOVE) ×4 IMPLANT
GLOVE BIO SURGEON STRL SZ 6.5 (GLOVE) ×4 IMPLANT
GLOVE BIOGEL PI IND STRL 7.0 (GLOVE) ×1 IMPLANT
GLOVE BIOGEL PI INDICATOR 7.0 (GLOVE) ×1
GOWN STRL REUS W/ TWL LRG LVL3 (GOWN DISPOSABLE) ×2 IMPLANT
GOWN STRL REUS W/TWL LRG LVL3 (GOWN DISPOSABLE) ×4 IMPLANT
GOWN STRL REUS W/TWL XL LVL3 (GOWN DISPOSABLE) ×4 IMPLANT
KIT BASIN OR (CUSTOM PROCEDURE TRAY) ×2 IMPLANT
LIGASURE IMPACT 36 18CM CVD LR (INSTRUMENTS) ×2 IMPLANT
LIQUID BAND (GAUZE/BANDAGES/DRESSINGS) ×2 IMPLANT
LOOP VESSEL MAXI BLUE (MISCELLANEOUS) IMPLANT
MESH PHASIX ST 25X30 (Mesh General) ×2 IMPLANT
NEEDLE BLUNT 17GA (NEEDLE) IMPLANT
NEEDLE HYPO 22GX1.5 SAFETY (NEEDLE) ×4 IMPLANT
NS IRRIG 1000ML POUR BTL (IV SOLUTION) ×4 IMPLANT
PACK GENERAL/GYN (CUSTOM PROCEDURE TRAY) ×2 IMPLANT
RELOAD LINEAR CUT PROX 55 BLUE (ENDOMECHANICALS) ×4 IMPLANT
RETRACTOR WND ALEXIS 25 LRG (MISCELLANEOUS) ×1 IMPLANT
RTRCTR WOUND ALEXIS 25CM LRG (MISCELLANEOUS) ×2
SHEET LAVH (DRAPES) ×2 IMPLANT
SLEEVE SURGEON STRL (DRAPES) ×2 IMPLANT
SPONGE LAP 18X18 X RAY DECT (DISPOSABLE) ×2 IMPLANT
STAPLER GUN LINEAR PROX 60 (STAPLE) ×2 IMPLANT
STAPLER PROXIMATE 55 BLUE (STAPLE) ×2 IMPLANT
STAPLER VISISTAT 35W (STAPLE) ×2 IMPLANT
SUT ETHILON 2 0 PSLX (SUTURE) ×2 IMPLANT
SUT MNCRL AB 4-0 PS2 18 (SUTURE) IMPLANT
SUT PDS AB 0 CT 36 (SUTURE) ×6 IMPLANT
SUT PDS AB 0 CTX 36 PDP370T (SUTURE) ×4 IMPLANT
SUT PDS AB 1 TP1 96 (SUTURE) ×4 IMPLANT
SUT SILK 2 0 (SUTURE) ×1
SUT SILK 2-0 18XBRD TIE 12 (SUTURE) ×1 IMPLANT
SUT SILK 3 0 SH CR/8 (SUTURE) ×4 IMPLANT
SUT VIC AB 0 CT1 36 (SUTURE) ×6 IMPLANT
SUT VIC AB 2-0 CT1 36 (SUTURE) ×4 IMPLANT
SUT VIC AB 2-0 CT2 27 (SUTURE) ×12 IMPLANT
SUT VIC AB 2-0 SH 27 (SUTURE) ×2
SUT VIC AB 2-0 SH 27X BRD (SUTURE) ×2 IMPLANT
SUT VIC AB 3-0 CTX 36 (SUTURE) IMPLANT
SUT VIC AB 3-0 SH 27 (SUTURE) ×2
SUT VIC AB 3-0 SH 27X BRD (SUTURE) ×2 IMPLANT
SYR 20CC LL (SYRINGE) ×4 IMPLANT
SYR 50ML LL SCALE MARK (SYRINGE) ×6 IMPLANT
TOWEL OR 17X26 10 PK STRL BLUE (TOWEL DISPOSABLE) ×4 IMPLANT
TOWEL OR NON WOVEN STRL DISP B (DISPOSABLE) ×2 IMPLANT
TRAY FOLEY W/METER SILVER 14FR (SET/KITS/TRAYS/PACK) ×2 IMPLANT

## 2015-07-06 NOTE — H&P (Signed)
50 yof underwent bso/omentectomy/appy feb 16 for ovarian cancer s/p chemo finished 6/16. postop had hernia develop to left of incision that has been increasing in size. no issues with bms. she was undergoing routine surveillance and has been found to have soft tissue nodularity in anterior abdominal mesenteric fat. only site of likely recurrence. she also has an incisional hernia containing small bowel. she is here to discuss combination hernia repair with resection of mass   Other Problems  High blood pressure Ovarian Cancer  Past Surgical History  Hemorrhoidectomy Hysterectomy (due to cancer) - Complete Thyroid Surgery  Diagnostic Studies History  Colonoscopy never Mammogram within last year Pap Smear 1-5 years ago  Allergies  Emend *ANTIEMETICS*  Medication History  AmLODIPine Besylate (10MG  Tablet, Oral) Active. Atorvastatin Calcium (20MG  Tablet, Oral) Active. Lisinopril-Hydrochlorothiazide (10-12.5MG  Tablet, Oral) Active. Metoprolol Tartrate (25MG  Tablet, Oral) Active. Opti-Tears (Ophthalmic) Active. Medications Reconciled  Social History  Caffeine use Carbonated beverages, Tea. No alcohol use No drug use Tobacco use Never smoker.  Family History  Arthritis Father. Cancer Sister. Cerebrovascular Accident Mother. Diabetes Mellitus Father. Family history unknown First Degree Relatives Heart Disease Father. Hypertension Brother, Father, Mother. Thyroid problems Mother.  Pregnancy / Birth History  Age of menopause 26-50 Gravida 2 Irregular periods Maternal age 74-30 Para 2   Review of Systems  General Present- Night Sweats. Not Present- Appetite Loss, Chills, Fatigue, Fever, Weight Gain and Weight Loss. Skin Present- Non-Healing Wounds. Not Present- Change in Wart/Mole, Dryness, Hives, Jaundice, New Lesions, Rash and Ulcer. HEENT Present- Wears glasses/contact lenses. Not Present- Earache, Hearing Loss, Hoarseness, Nose  Bleed, Oral Ulcers, Ringing in the Ears, Seasonal Allergies, Sinus Pain, Sore Throat, Visual Disturbances and Yellow Eyes. Breast Not Present- Breast Mass, Breast Pain, Nipple Discharge and Skin Changes. Cardiovascular Not Present- Chest Pain, Difficulty Breathing Lying Down, Leg Cramps, Palpitations, Rapid Heart Rate, Shortness of Breath and Swelling of Extremities. Gastrointestinal Not Present- Abdominal Pain, Bloating, Bloody Stool, Change in Bowel Habits, Chronic diarrhea, Constipation, Difficulty Swallowing, Excessive gas, Gets full quickly at meals, Hemorrhoids, Indigestion, Nausea, Rectal Pain and Vomiting. Female Genitourinary Not Present- Frequency, Nocturia, Painful Urination, Pelvic Pain and Urgency. Musculoskeletal Not Present- Back Pain, Joint Pain, Joint Stiffness, Muscle Pain, Muscle Weakness and Swelling of Extremities. Neurological Present- Numbness and Tingling. Not Present- Decreased Memory, Fainting, Headaches, Seizures, Tremor, Trouble walking and Weakness. Psychiatric Not Present- Anxiety, Bipolar, Change in Sleep Pattern, Depression, Fearful and Frequent crying. Endocrine Present- Hot flashes. Not Present- Cold Intolerance, Excessive Hunger, Hair Changes, Heat Intolerance and New Diabetes. Hematology Not Present- Easy Bruising, Excessive bleeding, Gland problems, HIV and Persistent Infections.  Vitals Weight: 224 lb Height: 65in Body Surface Area: 2.08 m Body Mass Index: 37.28 kg/m  Temp.: 97.1F(Temporal)  Pulse: 72 (Regular)  BP: 130/70 (Sitting, Left Arm, Standard) Physical Exam  General Mental Status-Alert. Orientation-Oriented X3. Chest and Lung Exam Chest and lung exam clear bilaterally Cardiovascular Cardiovascular examination reveals -normal heart sounds, regular rate and rhythm with no murmurs. Abdomen Soft incisional hernia reducible to left of incision    Assessment & Plan  INCISIONAL HERNIA (K43.2) I think reasonable to pursue  hernia repair as she is getting operated on for cancer. she certainly would do better with weight loss prior and this would be ideal but she needs surgery. sounds like an open surgery. hernia repair would be based on what procedure is done for cancer. If she does not have bowel resection then repair with permanent mesh in preperitoneal fashion would be the best option.  this would have lowest risk recurrence. would have drain placement. there still is recurrence risk with this about 10%. if bowel resection is required then I would not place permanent mesh. options would be repair with phasix a biosynthetic mesh which would give a little higher recurrence risk but could be combined safely with bowel resection. other option would be primary closure with understanding that another hernia would develop and repair at later date. I think all these are options and will really depend on what occurs to remove the mesenteric mass. we discussed all these options and risks today.

## 2015-07-06 NOTE — Anesthesia Postprocedure Evaluation (Signed)
Anesthesia Post Note  Patient: Sarah [name]  Procedure(s) Performed: Procedure(s) (LRB): EXPLORATORY LAPAROTOMY (N/A)  LYSIS OF ADHESION RESECTION OF MESENTERIC MASS BOWEL RESECTION  (N/A) INCISIONAL HERNIA REPAIR  (N/A) WITH INSERTION OF PHASIX ST MESH (N/A)  Patient location during evaluation: PACU Anesthesia Type: General Level of consciousness: awake and alert Pain management: pain level controlled Vital Signs Assessment: post-procedure vital signs reviewed and stable Respiratory status: spontaneous breathing, nonlabored ventilation, respiratory function stable and patient connected to nasal cannula oxygen Cardiovascular status: blood pressure returned to baseline and stable Postop Assessment: no signs of nausea or vomiting Anesthetic complications: no    Last Vitals:  Filed Vitals:   07/06/15 1445 07/06/15 1455  BP: 116/80 155/91  Pulse: 101 103  Temp:    Resp: 24 21    Last Pain:  Filed Vitals:   07/06/15 1457  PainSc: 3                  Alaisha Eversley J

## 2015-07-06 NOTE — Op Note (Signed)
OPERATIVE NOTE 07/06/15  Preoperative Diagnosis: recurrent low grade serous ovarian cancer, ventral abdominal hernia.   Postoperative Diagnosis:same    Procedure(s) Performed: 1. Exploratory laparotomy with lysis of adhesions 2. Small bowel resection 3. Interval debulking surgery.  Surgeon: Thereasa Solo, MD.  Assistant Surgeon: Dr Lahoma Crocker, M.D. Assistant: (an MD assistant was necessary for tissue manipulation, retraction and positioning due to the complexity of the case and hospital policies).   Specimens: mesenteric nodules, ileum    Estimated Blood Loss: 200 mL.    Urine [id number]  Complications: None.   Operative Findings: dense adhesions of small intestines to each other and sigmoid colon and anterior abdominal wall (requiring 60 minutes of adhesiolysis). Large upper abdominal midline/left ventral/incisional hernia containing small bowel.   Multiple plaque-like white superficial lesions on the mesentery of mid ileum and junction of ileum to mesentery within a 20cm expanse of ileum. This 20cm segment was removed with its mesentery. This represented an optimal cytoreduction (R0) with no gross visible disease remaining.   Procedure:   The patient was seen in the Holding Room. The risks, benefits, complications, treatment options, and expected outcomes were discussed with the patient.  The patient concurred with the proposed plan, giving informed consent.   The patient was  identified as Sarah [name]  and the procedure verified as ex lap, tumor debulking possible bowel resection. A planned combined procedure with [name] for an incisional hernia repair was also made. A Time Out was held and the above information confirmed upon entry to the operating room..  After induction of anesthesia, the patient was draped and prepped in the usual sterile manner.  She was prepped and draped in the normal sterile fashion in the dorsal lithotomy position in padded Allen stirrups with  good attention paid to support of the lower back and lower extremities. Position was adjusted for appropriate support. A Foley catheter was placed to gravity.   A midline vertical incision was made and carried through the subcutaneous tissue to the fascia. The fascial incision was made with care to avoid the hernia sac. The scalpel was used to sharply incise the fascia and peritoneum. The rectus muscles were separated. The peritoneum was identified and entered. The hernia sac resided predominantly in the upper abdomen/upper portion of the incision. It contained bowel that was not incarcerated. There were dense and multiple adhesions between loops of ileum and the anterior peritoneum. Meticulous sharp dissection with metzenbaum scissors was employed to take these down over a 60 minute period. This included separation of pelvic adhesions between sigmoid colon and ileum and peritoneum. The Alexis self retaining retractor was placed.  A survey of the abdomen and pelvis, which included running the bowel, palpating the retroperitoneum and the diaphragms, revealed the above findings, which were significant for plaques of white tissue (possibly tumor) in a 20cm segment of ileum and corresponding mesentery at the mid-portion of the ileum. In order to spare maximal mesentery, the bovie was used to peritoneally strip some of the implants from the root of the mesentery. These were sent as a separate specimen. The 20cm segment of ileum was then resected.  The segment of ileum was selected. A window was created at the junction of the bowel wall and mesentery. The bowel stapler was passed through the window and and the 52mm GIA stapler was deployed and fired. At the distal margin a similar process took place. The mesentery was scored then sealed and transected with ligasure to free up the sgment of  ileum. The corners of the bowel ends were opened. The GIA stapler was passed into these limbs, deployed and fired to create a  stapled functional side-to-side anastamosis. The opening of the two limbs was reapproximated with allis clamps. A 26mm TA stapler was used to seal the top of the anastamosis with a double layer of staples. The mesenteric window was closed with 3-0 silk interrupted. Copious irrigation of the peritoneal cavity took place.  The bowel was again run. 2 areas of deserosalized ileum were identified and were reinfored and imbricated with lembricks sutures with 3-0 vicryl.  The peritoneal cavity was irrigated and hemostasis was confirmed at all surgical sites.  No residual tumor was present at the completion of the debulking.  [name] assumed role as primary surgeon for fascial repair and closure.   Donaciano Eva, MD

## 2015-07-06 NOTE — Progress Notes (Signed)
Dr. Delma Post made aware of patient's heart rates- 98-108- orders given- 500 cc LR IV Bolus given.

## 2015-07-06 NOTE — Progress Notes (Signed)
Patient's blood sugar is now 392. Paged NP and awaiting new orders. Will continue to monitor.

## 2015-07-06 NOTE — Transfer of Care (Signed)
Immediate Anesthesia Transfer of Care Note  Patient: Sarah [name]  Procedure(s) Performed: Procedure(s): EXPLORATORY LAPAROTOMY (N/A)  LYSIS OF ADHESION RESECTION OF MESENTERIC MASS BOWEL RESECTION  (N/A) INCISIONAL HERNIA REPAIR  (N/A) WITH INSERTION OF PHASIX ST MESH (N/A)  Patient Location: PACU  Anesthesia Type:General  Level of Consciousness: sedated  Airway & Oxygen Therapy: Patient Spontanous Breathing and Patient connected to face mask oxygen  Post-op Assessment: Report given to RN and Post -op Vital signs reviewed and stable  Post vital signs: Reviewed and stable  Last Vitals:  Filed Vitals:   07/06/15 0719  BP: 145/90  Pulse: 79  Temp: 36.4 C  Resp: 18    Complications: No apparent anesthesia complications

## 2015-07-06 NOTE — Progress Notes (Signed)
Dr. Delma Post made aware of patient's vital signs- urine output in PACU- O.K. To go to floor

## 2015-07-06 NOTE — Progress Notes (Signed)
Dr. Delma Post made aware of patient's CBG RESULTS IN pacu and previous glucose results on lab work- made aware of Dr. Anson Crofts orders.

## 2015-07-06 NOTE — H&P (Signed)
H&P Assessment:  50 y.o. 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  S/p BSO, omentectomy, appendectomy on 07/28/14. S/p adjuvant chemotherapy with 6 cycles of paclitaxel and carboplatin completed on 12/08/14.  She has apparent recurrence with a 1-2cm lesion in the small bowel mesentery in close approximation with a ventral hernia. Her lung nodules are stable and this appears to be the only focus of disease recurrence.  Ventral hernia  Plan:   Recommend surgery - resection of mesenteric mass, possible bowel resection and hernia repair (with the assistance of [name] from General Surgery). This tumor is not very chemosensitive, and is best treated with surgical resection if amenable and if localized in recurrence. She is at high risk for surgical complications given her obesity (BMI 36) and prior surgeries. I will have her meet with general surgery and we will discuss a coordinated surgical effort.  HPI: Sarah [name] is a 50 y.o. year old initially seen in consultation on 07/08/14 for bilateral ovarian masses and ascites. This was diagnosed at the time of admission to the hospital for hypertensive crisis. This diagnosis of hypertension was new and she was newly started on a medical regimen for this. She then underwent an exploratory laparotomy, BSO, omentectomy, appendectomy on 07/28/14 with Dr Skeet Latch without complications. Her postoperative course was uncomplicated. 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 This represented a stage IIIc disease process.  She received 6 cycles of adjuvant chemotherapy with paclitaxel and carboplatin with Dr Marko Plume on 08/25/14 to 12/08/14.  Pretreatment CA 125 was 58 on 06/30/14, and was 20 on 12/22/14 (it's nadir had been 16 on 09/22/14).  Post treatment CT of the chest, abdomen and pelvis on 01/12/15 revealed: Previously noted  paratracheal, subcarinal and prevascular mediastinal lymphadenopathy has resolved, with no residual pathologically enlarged mediastinal lymph nodes. Resolved hilar lymphadenopathy, with no residual pathologically enlarged mediastinal nodes. The previously described 3 mm subpleural left lower lobe pulmonary nodule is decreased to 2 mm (series 4/image 41). A separate 2 mm subpleural left lower lobe pulmonary nodule (4/29) is slightly decreased from 13mm on 06/28/2014. A subpleural 3 mm right upper lobe pulmonary nodule (4/15) is unchanged from 06/28/2014. A 2 mm subpleural pulmonary nodule in the superior segment right lower lobe associated with the right major fissure (4/21) appears new. A 0.7 cm right posterior mesenteric node measures 0.7 cm short axis (series 2/image 93), decreased from 1.2 cm. No definite peritoneal tumor implants detected. A 0.4 cm nodule anterior/inferior to the spleen (series 2/ image 60) is mildly decreased from 0.7 cm. Small umbilical hernia containing a small bowel loop, with no evidence of small-bowel obstruction or strangulation.  Interval Hx: she is doing well post treatment with residual symptoms of neuropathy in the feet that is stable. She denies cough. She denies abdominal symptoms other than a minimally painful bulge in the mid abdomen.  She had a CA 125 on 03/27/15 that was normal and stable at 14.  A CT of the chest, abdomen and pelvis was performed on 04/27/15 which showed: No pulmonary mass, infiltrate, or effusion. Tiny less than 5 mm subpleural pulmonary nodules in the right upper lobe on image remain stable. No new or enlarging pulmonary nodules identified. Small left paraventral hernia containing bowel loops. New soft tissue nodularity is seen within the anterior abdominal mesenteric fat, with largest nodule measuring 10 x 17 mm. No other sites of peritoneal nodularity or soft  tissue density seen.   Current Outpatient Prescriptions on File Prior to Visit   Medication Sig Dispense Refill  . acetaminophen (TYLENOL) 500 MG tablet Take 1,000 mg by mouth every 6 (six) hours as needed for mild pain or headache.    Marland Kitchen amLODipine (NORVASC) 10 MG tablet TAKE 1 TABLET BY MOUTH DAILY 30 tablet 3  . atorvastatin (LIPITOR) 20 MG tablet Take 1 tablet (20 mg total) by mouth daily. 90 tablet 3  . lisinopril-hydrochlorothiazide (PRINZIDE,ZESTORETIC) 10-12.5 MG per tablet Take 1 tablet by mouth daily. 90 tablet 3  . metoprolol tartrate (LOPRESSOR) 25 MG tablet TAKE 1/2 TABLET BY MOUTH 2 TIMES A DAY 30 tablet 2   No current facility-administered medications on file prior to visit.   Allergies  Allergen Reactions  . Emend [Aprepitant] Other (See Comments)    Urticaria    Past Medical History  Diagnosis Date  . Hypertension   . Glucagonoma 07/28/14    Pt denies this but states she has glaucoma  . Shortness of breath dyspnea     hx of 2013 - no problems currently   . Glaucoma 02/16  . Family history of colon cancer   . Peritoneal carcinomatosis [facility])    Past Surgical History  Procedure Laterality Date  . Abdominal hysterectomy    . Thyroidectomy, partial    . Robotic assisted total hysterectomy with bilateral salpingo oopherectomy Bilateral 07/28/2014    Procedure: ROBOTIC ASSISTED lysis of adhesions with biopsies, converted to LAPAROTOMY, bilateral salpingoorphorectomy, omentectomy,appendectomy; Surgeon: Janie Morning, MD; Location: [facility]S; Service: Gynecology; Laterality: Bilateral;   Family History  Problem Relation Age of Onset  . Hypertension Mother   . Hypertension Father   . Diabetes Father   . Cancer Sister 50    fibrosarcoma (back); currently 75  . Prostate cancer Maternal Uncle   . Colon cancer Paternal Aunt     Dx 81s; deceased 84s  . Prostate cancer Paternal Uncle     currently 9  . Cancer Paternal  Uncle 75    "bone" ; unk. primary  . Stomach cancer Paternal Uncle    Social History   Social History  . Marital Status: Married    Spouse Name: N/A  . Number of Children: N/A  . Years of Education: N/A   Occupational History  . Not on file.   Social History Main Topics  . Smoking status: Never Smoker   . Smokeless tobacco: Never Used  . Alcohol Use: No  . Drug Use: No  . Sexual Activity: Not on file   Other Topics Concern  . Not on file   Social History Narrative    Review of systems: Constitutional: She has no weight gain or weight loss. She has no fever or chills. Eyes: No blurred vision Ears, Nose, Mouth, Throat: No dizziness, headaches or changes in hearing. No mouth sores. Cardiovascular: No chest pain, palpitations or edema. Respiratory: No shortness of breath, wheezing or cough Gastrointestinal: She has normal bowel movements without diarrhea or constipation. She denies any nausea or vomiting. She denies blood in her stool or heart burn. Genitourinary: She denies pelvic pain, pelvic pressure or changes in her urinary function. She has no hematuria, dysuria, or incontinence. She has no irregular vaginal bleeding or vaginal discharge Musculoskeletal: Denies muscle weakness or joint pains.  Skin: She has no skin changes, rashes or itching Neurological: Denies dizziness or headaches. + bilateral foot neuropathy Psychiatric: She denies depression or anxiety. Hematologic/Lymphatic: No easy bruising or bleeding   Physical  Exam: Blood pressure 152/95, pulse 64, temperature 97.9 F (36.6 C), temperature source Oral, resp. rate 18, height 5\' 5"  (1.651 m), weight 220 lb (99.791 kg), SpO2 98 %. General: Well dressed, well nourished in no apparent distress.  HEENT: Normocephalic and atraumatic, no lesions. Extraocular muscles intact. Sclerae anicteric. Pupils equal, round, reactive. No mouth sores or  ulcers. Thyroid is normal size, not nodular, midline. Skin: No lesions or rashes. Breasts: Soft, symmetric. No skin or nipple changes. No palpable LN or masses. Lungs: Clear to auscultation bilaterally. No wheezes. Cardiovascular: Regular rate and rhythm. No murmurs or rubs. Abdomen: Soft, nontender, nondistended. No palpable masses. No hepatosplenomegaly. No ascites. Normal bowel sounds. No hernias. Incision is healed. Left periumbilical hernia measuring approximately 5cm is palpable, soft and reducible. No masses appreciated. Genitourinary: Normal EGBUS Vaginal cuff intact. No bleeding or discharge. No cul de sac fullness. Extremities: No cyanosis, clubbing or edema. No calf tenderness or erythema. No palpable cords. Psychiatric: Mood and affect are appropriate. Neurological: Awake, alert and oriented x 3. Sensation is intact, no neuropathy.  Musculoskeletal: No pain, normal strength and range of motion.  Donaciano Eva, MD

## 2015-07-06 NOTE — Anesthesia Procedure Notes (Signed)
Procedure Name: Intubation Date/Time: 07/06/2015 9:50 AM Performed by: Lind Covert Pre-anesthesia Checklist: Patient identified, Timeout performed, Emergency Drugs available, Suction available and Patient being monitored Patient Re-evaluated:Patient Re-evaluated prior to inductionOxygen Delivery Method: Circle system utilized Preoxygenation: Pre-oxygenation with 100% oxygen Intubation Type: IV induction Ventilation: Mask ventilation without difficulty Laryngoscope Size: Mac and 3 Grade View: Grade II Tube type: Oral Tube size: 7.0 mm Number of attempts: 1 Airway Equipment and Method: Stylet Placement Confirmation: breath sounds checked- equal and bilateral,  ETT inserted through vocal cords under direct vision and positive ETCO2 Secured at: 22 cm Tube secured with: Tape Dental Injury: Teeth and Oropharynx as per pre-operative assessment

## 2015-07-06 NOTE — Interval H&P Note (Signed)
History and Physical Interval Note:  07/06/2015 10:56 AM  Sarah [name]  has presented today for surgery, with the diagnosis of OVARIAN CANCER INCISIONAL HERNIA  The various methods of treatment have been discussed with the patient and family. After consideration of risks, benefits and other options for treatment, the patient has consented to  Procedure(s): EXPLORATORY LAPAROTOMY (N/A) LYSIS OF ADHESION RESECTION OF MESENTERIC MASS POSSIBLE BOWEL RESECTION  (N/A) INCISIONAL HERNIA REPAIR  (N/A) WITH INSERTION OF PHASIX ST MESH (N/A) as a surgical intervention .  The patient's history has been reviewed, patient examined, no change in status, stable for surgery.  I have reviewed the patient's chart and labs.  Questions were answered to the patient's satisfaction.     Meldon Hanzlik

## 2015-07-06 NOTE — Progress Notes (Signed)
Dr. Delma Post made aware of patient's heart rates- blood pressures- SA02S- urine output- orders given- Labetalol 2.5 mg IVP given as ordered.

## 2015-07-06 NOTE — Op Note (Signed)
Preoperative diagnosis: Incisional hernia Postoperative diagnosis: Same as above Procedure: #1 bilateral posterior rectus sheath release for abdominal wall closure #2 pre-peritoneal incisional hernia repair with 15x30 phasix st mesh Surgeon: Dr. Serita Grammes Estimated blood loss: 50 mL Anesthesia Gen. Drains: 17 French Blake drains above the mesh Sponge count was correct at completion Specimens: None Complications: None Disposition to recovery stable  Indications: This is a 2 yof undergoing exploration for possible recurrent disease by gyn oncology.  She has a symptomatic hernia. I discussed open incisional hernia repair likely with myofascial release and placement of preperitoneal mesh. We discussed use of a biosynthetic due to possible bowel resection and the risk of recurrence.  Procedure: After informed consent was obtained the patient was taken to the operating room by Dr [name].  She performed a small bowel resection and extensive lysis of adhesions.  I came in at the completion and discussed case with her. Patient was already under general anesthesia and prepped/draped. She was receiving second dose of antibiotics.  A timeout was performed.    The abdomen was already open.  The fascia nearly came together. The peritoneum was intact.  I wanted to place preperitoneal mesh due to possibility of further surgery due to her disease.   I then incised my posterior rectus sheath on both sides. I then entered into the posterior rectus space and traveled out laterally. I created the peritoneal flaps as far lateral as they would go. . I then created the superior space to the xiphoid process. Inferiorly I entered the preperitoneal space all the way down to his pelvis. Once I developed the space I then looked intra-abdominally again. This was hemostatic and the bowel was all fine. I then closed the peritoneum with a running 0 pds suture. Once I closed this I measured and then I used a 15x25 cm phasix  st mesh that I had cut and placed this in the preperitoneal space. This completely covered the defect and laid it flat. I used phasix as I did not want to place synthetic mesh due to bowel resection. I placed 4 0 pds sutures around the mesh. I then used the Endo Close device to pull these up and to secure the mesh in position. Once I had done this I placed a 100 [place] Blake drain overlying the mesh. I then proceeded to close the fascia with #1 PDS suture. I secured the drain with a 2-0 nylon. The skin was closed with staples. I glued the suture sites together. She tolerated this well was extubated and transferred to the recovery room in stable condition.

## 2015-07-06 NOTE — Progress Notes (Signed)
Dr. Delsa Sale notified of patient's CBG results in [place]  And previous glucose results on chart- orders given.

## 2015-07-07 DIAGNOSIS — R739 Hyperglycemia, unspecified: Secondary | ICD-10-CM

## 2015-07-07 LAB — BASIC METABOLIC PANEL
ANION GAP: 13 (ref 5–15)
BUN: 12 mg/dL (ref 6–20)
CALCIUM: 9.4 mg/dL (ref 8.9–10.3)
CO2: 26 mmol/L (ref 22–32)
Chloride: 96 mmol/L — ABNORMAL LOW (ref 101–111)
Creatinine, Ser: 1.13 mg/dL — ABNORMAL HIGH (ref 0.44–1.00)
GFR, EST NON AFRICAN AMERICAN: 56 mL/min — AB (ref >60)
Glucose, Bld: 226 mg/dL — ABNORMAL HIGH (ref 65–99)
Potassium: 4.2 mmol/L (ref 3.5–5.1)
Sodium: 135 mmol/L (ref 135–145)

## 2015-07-07 LAB — GLUCOSE, CAPILLARY
GLUCOSE-CAPILLARY: 266 mg/dL — AB (ref 65–99)
GLUCOSE-CAPILLARY: 220 mg/dL — AB (ref 65–99)
GLUCOSE-CAPILLARY: 185 mg/dL — AB (ref 65–99)
GLUCOSE-CAPILLARY: 156 mg/dL — AB (ref 65–99)
Glucose-Capillary: 177 mg/dL — ABNORMAL HIGH (ref 65–99)
Glucose-Capillary: 169 mg/dL — ABNORMAL HIGH (ref 65–99)

## 2015-07-07 LAB — CBC
HCT: 35.6 % — ABNORMAL LOW (ref 36.0–46.0)
Hemoglobin: 11.8 g/dL — ABNORMAL LOW (ref 12.0–15.0)
MCH: 30.5 pg (ref 26.0–34.0)
MCHC: 33.1 g/dL (ref 30.0–36.0)
MCV: 92 fL (ref 78.0–100.0)
PLATELETS: 300 10*3/uL (ref 150–400)
RBC: 3.87 MIL/uL (ref 3.87–5.11)
RDW: 13.8 % (ref 11.5–15.5)
WBC: 13.3 10*3/uL — AB (ref 4.0–10.5)

## 2015-07-07 MED ORDER — ENOXAPARIN (LOVENOX) PATIENT EDUCATION KIT
PACK | Freq: Once | Status: AC
  Administered 2015-07-07: 14:00:00
  Filled 2015-07-07: qty 1

## 2015-07-07 MED ORDER — LIVING WELL WITH DIABETES BOOK
Freq: Once | Status: AC
  Administered 2015-07-07: 15:00:00
  Filled 2015-07-07: qty 1

## 2015-07-07 MED ORDER — INSULIN ASPART 100 UNIT/ML ~~LOC~~ SOLN
0.0000 [IU] | Freq: Three times a day (TID) | SUBCUTANEOUS | Status: DC
  Administered 2015-07-07 – 2015-07-09 (×6): 4 [IU] via SUBCUTANEOUS
  Administered 2015-07-09 – 2015-07-10 (×3): 3 [IU] via SUBCUTANEOUS
  Administered 2015-07-10: 4 [IU] via SUBCUTANEOUS
  Administered 2015-07-10 – 2015-07-11 (×4): 3 [IU] via SUBCUTANEOUS

## 2015-07-07 MED ORDER — GLUCERNA SHAKE PO LIQD
237.0000 mL | Freq: Two times a day (BID) | ORAL | Status: DC
  Administered 2015-07-07 – 2015-07-12 (×6): 237 mL via ORAL
  Filled 2015-07-07 (×11): qty 237

## 2015-07-07 MED ORDER — INSULIN ASPART 100 UNIT/ML ~~LOC~~ SOLN
0.0000 [IU] | SUBCUTANEOUS | Status: DC
  Administered 2015-07-07: 7 [IU] via SUBCUTANEOUS
  Administered 2015-07-07: 4 [IU] via SUBCUTANEOUS

## 2015-07-07 NOTE — Progress Notes (Signed)
Inpatient Diabetes Program Recommendations  AACE/ADA: New Consensus Statement on Inpatient Glycemic Control (2015)  Target Ranges:  Prepandial:   less than 140 mg/dL      Peak postprandial:   less than 180 mg/dL (1-2 hours)      Critically ill patients:  140 - 180 mg/dL   Review of Glycemic Control  Diabetes history: GDM Outpatient Diabetes medications: None Current orders for Inpatient glycemic control: Novolog resistant tidwc  Inpatient Diabetes Program Recommendations:    Consider addition of Lantus 15 units QHS Add HS correction  Spoke with pt and children regarding glycemic control. Pt states she has family hx DM and GDM. Has had polyuria, polydipsia for the past few months.  PCP is [facility]. HgbA1C results pending.  Will order Living Well With Diabetes book and diabetes videos on pt ed channel. If pt is to go home on insulin, please order insulin starter kit and RN to begin teaching insulin administration. Will need prescription for glucose meter and supplies to check blood sugars at home.  Will continue to follow. Thank you. Lorenda Peck, RD, LDN, CDE Inpatient Diabetes Coordinator [phone]

## 2015-07-07 NOTE — Progress Notes (Signed)
1 Day Post-Op  Subjective: Doing well this am, glucose elevated  Objective: Vital signs in last 24 hours: Temp:  [97.5 F (36.4 C)-98.2 F (36.8 C)] 98.1 F (36.7 C) (01/27 0523) Pulse Rate:  [79-107] 88 (01/27 0523) Resp:  [18-28] 18 (01/27 0523) BP: (116-171)/(77-93) 134/89 mmHg (01/27 0523) SpO2:  [90 %-100 %] 99 % (01/27 0523) Weight:  [102.513 kg (226 lb)] 102.513 kg (226 lb) (01/26 0722) Last BM Date: 07/05/15  Intake/Output from previous day: 01/26 0701 - 01/27 0700 In: 5037.5 [P.O.:480; I.V.:4057.5; IV Piggyback:500] Out: 3269 [Urine:2725; Drains:294; Blood:250] Intake/Output this shift: Total I/O In: 937.5 [P.O.:480; I.V.:457.5] Out: 1614 [Urine:1450; Drains:164]  GI: dressing in place, drain with expected output, approp tender  Lab Results:   Recent Labs  07/07/15 0433  WBC 13.3*  HGB 11.8*  HCT 35.6*  PLT 300   BMET  Recent Labs  07/07/15 0433  NA 135  K 4.2  CL 96*  CO2 26  GLUCOSE 226*  BUN 12  CREATININE 1.13*  CALCIUM 9.4   PT/INR No results for input(s): LABPROT, INR in the last 72 hours. ABG No results for input(s): PHART, HCO3 in the last 72 hours.  Invalid input(s): PCO2, PO2  Studies/Results: No results found.  Anti-infectives: Anti-infectives    Start     Dose/Rate Route Frequency Ordered Stop   07/06/15 0732  cefOXitin (MEFOXIN) 2 g in dextrose 5 % 50 mL IVPB     2 g 100 mL/hr over 30 Minutes Intravenous On call to O.R. 07/06/15 0732 07/06/15 1153      Assessment/Plan: POD 1 sbr with preperitoneal incisional hernia repair with biosynthetic mesh  1. Continue drain, will likely remove in office depending on when discharged 2. Diet, glucose per gyn oncology   [facility] 07/07/2015

## 2015-07-07 NOTE — Progress Notes (Signed)
1 Day Post-Op Procedure(s) (LRB): EXPLORATORY LAPAROTOMY (N/A)  LYSIS OF ADHESION RESECTION OF MESENTERIC MASS BOWEL RESECTION  (N/A) INCISIONAL HERNIA REPAIR  (N/A) WITH INSERTION OF PHASIX ST MESH (N/A)  Subjective: Patient reports doing well this am.  Tolerated solid food this am with no nausea or emesis reported.  Reporting one episode of emesis last pm.  Pain minimal and described as soreness.  Has not ambulated at this time.  Due to void since foley removal.  Denies chest pain, dyspnea, passing flatus, or having a bowel movement.  No concerns voiced.   Objective: Vital signs in last 24 hours: Temp:  [97.5 F (36.4 C)-98.2 F (36.8 C)] 97.7 F (36.5 C) (01/27 1000) Pulse Rate:  [75-107] 75 (01/27 1000) Resp:  [18-28] 18 (01/27 1000) BP: (116-171)/(77-93) 141/78 mmHg (01/27 1000) SpO2:  [90 %-99 %] 94 % (01/27 1000) Last BM Date: 07/05/15  Intake/Output from previous day: 01/26 0701 - 01/27 0700 In: 5237.5 [P.O.:480; I.V.:4257.5; IV Piggyback:500] Out: 7867 [Urine:2725; Drains:294; Blood:250]  Physical Examination: General: alert, cooperative and no distress Resp: clear to auscultation bilaterally Cardio: regular rate and rhythm, S1, S2 normal, no murmur, click, rub or gallop GI: incision: midline incision with honeycomb dressing intact with no evidence of drainage, binder in place, JP drain stripped and 60cc sanguinous drainage emptied. and abdomen obese, soft, non-distended, hypoactive bowel sounds, more bowel sounds in the lower quadrants Extremities: extremities normal, atraumatic, no cyanosis or edema  Labs: WBC/Hgb/Hct/Plts:  13.3/11.8/35.6/300 (01/27 0433) BUN/Cr/glu/ALT/AST/amyl/lip:  12/1.13/--/--/--/--/-- (01/27 0433)  Assessment: 50 y.o. s/p Procedure(s): EXPLORATORY LAPAROTOMY  LYSIS OF ADHESION RESECTION OF MESENTERIC MASS BOWEL RESECTION  INCISIONAL HERNIA REPAIR  WITH INSERTION OF PHASIX ST MESH: stable Pain:  Pain is well-controlled on PRN  medications.  Heme: Hgb 11.8 and Hct 35.6 this am.  Stable post-operatively.  CV: BP and HR stable post-operatively.  Continue to monitor with routine vital sign assessments.  Lisinopril, HCTZ, and Metoprolol ordered.  GI:  Tolerating po: Yes.  Antiemetics ordered PRN.  GU: Adequate output reported.  Due to void since foley removal.    FEN: Stable post-operatively.  Endo: Hemoglobin A1C pending.  Hyperglycemia post-op.  Prophylaxis: pharmacologic prophylaxis (with any of the following: enoxaparin (Lovenox) 26m SQ 2 hours prior to surgery then every day) and intermittent pneumatic compression boots.  Plan: Diet to Carb Mod.  Ensure changed to Glucerna. Follow up on A1C results.  Consult diabetes coordinator. Monitor CBGs Ac/HS with sliding scale coverage Encourage ambulation, IS use, deep breathing, and coughing Saline lock IV this afternoon if diet tolerated Lovenox teaching kit for discharge Continue post-operative plan of care per Dr. JDelsa Saleand Dr. WDonne Hazel    LOS: 1 day    CROSS, MELISSA DEAL 07/07/2015, 11:46 AM

## 2015-07-08 DIAGNOSIS — K567 Ileus, unspecified: Secondary | ICD-10-CM

## 2015-07-08 DIAGNOSIS — K9189 Other postprocedural complications and disorders of digestive system: Secondary | ICD-10-CM

## 2015-07-08 LAB — GLUCOSE, CAPILLARY
GLUCOSE-CAPILLARY: 170 mg/dL — AB (ref 65–99)
GLUCOSE-CAPILLARY: 159 mg/dL — AB (ref 65–99)
Glucose-Capillary: 199 mg/dL — ABNORMAL HIGH (ref 65–99)
Glucose-Capillary: 166 mg/dL — ABNORMAL HIGH (ref 65–99)
Glucose-Capillary: 178 mg/dL — ABNORMAL HIGH (ref 65–99)

## 2015-07-08 LAB — CBC
HCT: 38.1 % (ref 36.0–46.0)
Hemoglobin: 12.9 g/dL (ref 12.0–15.0)
MCH: 31.2 pg (ref 26.0–34.0)
MCHC: 33.9 g/dL (ref 30.0–36.0)
MCV: 92 fL (ref 78.0–100.0)
PLATELETS: 339 10*3/uL (ref 150–400)
RBC: 4.14 MIL/uL (ref 3.87–5.11)
RDW: 14.1 % (ref 11.5–15.5)
WBC: 16.4 10*3/uL — AB (ref 4.0–10.5)

## 2015-07-08 LAB — BASIC METABOLIC PANEL
ANION GAP: 15 (ref 5–15)
BUN: 18 mg/dL (ref 6–20)
CALCIUM: 9.3 mg/dL (ref 8.9–10.3)
CO2: 30 mmol/L (ref 22–32)
CREATININE: 1.28 mg/dL — AB (ref 0.44–1.00)
Chloride: 90 mmol/L — ABNORMAL LOW (ref 101–111)
GFR calc Af Amer: 56 mL/min — ABNORMAL LOW (ref >60)
GFR calc non Af Amer: 48 mL/min — ABNORMAL LOW (ref >60)
GLUCOSE: 194 mg/dL — AB (ref 65–99)
Potassium: 3.7 mmol/L (ref 3.5–5.1)
Sodium: 135 mmol/L (ref 135–145)

## 2015-07-08 LAB — MAGNESIUM: Magnesium: 2.3 mg/dL (ref 1.7–2.4)

## 2015-07-08 LAB — HEMOGLOBIN A1C
HEMOGLOBIN A1C: 10.1 % — AB (ref 4.8–5.6)
Mean Plasma Glucose: 243 mg/dL

## 2015-07-08 MED ORDER — ALUM & MAG HYDROXIDE-SIMETH 200-200-20 MG/5ML PO SUSP
30.0000 mL | Freq: Once | ORAL | Status: AC
  Administered 2015-07-08: 30 mL via ORAL
  Filled 2015-07-08: qty 30

## 2015-07-08 NOTE — Progress Notes (Signed)
Patient ID: Sarah [name], female   DOB: 09-13-1965, 50 y.o.   MRN: [id number] 2 Days Post-Op  Subjective: Has had nausea and vomiting with any attempt at by mouth intake. Mild pain controlled with medications. Has been up out of bed.No flatus or bowel movements yet.  Objective: Vital signs in last 24 hours: Temp:  [97.5 F (36.4 C)-98.7 F (37.1 C)] 97.5 F (36.4 C) (01/28 0550) Pulse Rate:  [69-92] 90 (01/28 0550) Resp:  [18] 18 (01/28 0550) BP: (128-158)/(78-88) 158/88 mmHg (01/28 0550) SpO2:  [90 %-94 %] 91 % (01/28 0550) Last BM Date: 07/05/15  Intake/Output from previous day: 01/27 0701 - 01/28 0700 In: 1360 [P.O.:360; I.V.:1000] Out: 635 [Urine:450; Drains:185] Intake/Output this shift:    General appearance: alert, cooperative and no distress GI: normal findings: soft, non-tender Incision/Wound: dressing clean and dry. JP drainage serosanguineous.  Lab Results:   Recent Labs  07/07/15 0433  WBC 13.3*  HGB 11.8*  HCT 35.6*  PLT 300   BMET  Recent Labs  07/07/15 0433  NA 135  K 4.2  CL 96*  CO2 26  GLUCOSE 226*  BUN 12  CREATININE 1.13*  CALCIUM 9.4   CBG (last 3)   Recent Labs  07/07/15 1201 07/07/15 1601 07/07/15 2135  GLUCAP 177* 169* 156*      Studies/Results: No results found.  Anti-infectives: Anti-infectives    Start     Dose/Rate Route Frequency Ordered Stop   07/06/15 0732  cefOXitin (MEFOXIN) 2 g in dextrose 5 % 50 mL IVPB     2 g 100 mL/hr over 30 Minutes Intravenous On call to O.R. 07/06/15 0732 07/06/15 1153      Assessment/Plan: s/p Procedure(s): EXPLORATORY LAPAROTOMY  LYSIS OF ADHESION RESECTION OF MESENTERIC MASS BOWEL RESECTION  INCISIONAL HERNIA REPAIR  WITH INSERTION OF PHASIX ST MESH Appears to have postoperative ileus, not unexpected.Would continue nothing by mouth today. Ambulation encouraged.   LOS: 2 days    Addelyn Alleman T 07/08/2015

## 2015-07-08 NOTE — Progress Notes (Signed)
2 Days Post-Op Procedure(s) (LRB): EXPLORATORY LAPAROTOMY (N/A)  LYSIS OF ADHESION RESECTION OF MESENTERIC MASS BOWEL RESECTION  (N/A) INCISIONAL HERNIA REPAIR  (N/A) WITH INSERTION OF PHASIX ST MESH (N/A)  Subjective: Emesis overnight and this morning. Feels nauseated. No flatus. Pain not severe.  Objective: Vital signs in last 24 hours: Temp:  [97.5 F (36.4 C)-98.7 F (37.1 C)] 97.5 F (36.4 C) (01/28 0550) Pulse Rate:  [69-92] 90 (01/28 0550) Resp:  [18] 18 (01/28 0550) BP: (128-158)/(78-88) 158/88 mmHg (01/28 0550) SpO2:  [90 %-94 %] 91 % (01/28 0550) Last BM Date: 07/05/15  Intake/Output from previous day: 01/27 0701 - 01/28 0700 In: 1360 [P.O.:360; I.V.:1000] Out: 635 [Urine:450; Drains:185]  Physical Examination: General: alert, cooperative and no distress Resp: clear to auscultation bilaterally Cardio: regular rate and rhythm, S1, S2 normal, no murmur, click, rub or gallop GI: incision: midline incision with honeycomb dressing intact with no evidence of drainage, binder in place, JP drain stripped and 60cc sanguinous drainage emptied. and abdomen obese, soft, slightly-distended, hypoactive bowel sounds, more bowel sounds in the lower quadrants Extremities: extremities normal, atraumatic, no cyanosis or edema  Labs:     Today's pending Assessment: 50 y.o. s/p Procedure(s): EXPLORATORY LAPAROTOMY  LYSIS OF ADHESION RESECTION OF MESENTERIC MASS BOWEL RESECTION  INCISIONAL HERNIA REPAIR  WITH INSERTION OF PHASIX ST MESH: stable Pain:  Pain is well-controlled on PRN medications.  Heme: Hgb 11.8 and Hct 35.6 yesterday. Today's pending but vitals not suggestive of ongoing leeding  CV: BP and HR stable post-operatively.  Continue to monitor with routine vital sign assessments.  Lisinopril, HCTZ, and Metoprolol ordered (but not absorbed at present due to ileus).  GI:  Postoperative delayed return of bowel function: expected/anticipated due to extensive adhesiolysis and  bowel resection. Recommend continue IVF's and NPO status until flatus  GU: Adequate output reported.  Voiding.  FEN: Given NPO with ileus, will continue to check 'lytes daily and replete prn.  Endo: Hemoglobin A1C pending.  Continue insulin sliding scale prn  Prophylaxis: pharmacologic prophylaxis (with any of the following: enoxaparin (Lovenox) 31m SQ 2 hours prior to surgery then every day) and intermittent pneumatic compression boots.  Plan: Diet NPO until flatus for ileus/delayed return of GI function. Follow up on A1C results.  Consult diabetes coordinator. Monitor CBGs Ac/HS with sliding scale coverage Encourage ambulation, IS use, deep breathing, and coughing Continue IVF at 50 until GI function improved Lovenox teaching kit for discharge - for 28 days postop.    LOS: 2 days    RDonaciano Eva1/28/2017, 9:46 AM

## 2015-07-08 NOTE — Progress Notes (Signed)
Pt had several episodes of nausea/vomiting last pm.  Patient unable to tolerate anything given po, relief with IV zofran.  Vanessa Alesi Rn

## 2015-07-09 ENCOUNTER — Inpatient Hospital Stay (HOSPITAL_COMMUNITY): Payer: Medicaid Other

## 2015-07-09 LAB — CBC
HCT: 37.3 % (ref 36.0–46.0)
Hemoglobin: 12 g/dL (ref 12.0–15.0)
MCH: 30.5 pg (ref 26.0–34.0)
MCHC: 32.2 g/dL (ref 30.0–36.0)
MCV: 94.9 fL (ref 78.0–100.0)
PLATELETS: 325 10*3/uL (ref 150–400)
RBC: 3.93 MIL/uL (ref 3.87–5.11)
RDW: 13.9 % (ref 11.5–15.5)
WBC: 13.2 10*3/uL — ABNORMAL HIGH (ref 4.0–10.5)

## 2015-07-09 LAB — BASIC METABOLIC PANEL
Anion gap: 10 (ref 5–15)
BUN: 17 mg/dL (ref 6–20)
CALCIUM: 9.2 mg/dL (ref 8.9–10.3)
CO2: 34 mmol/L — ABNORMAL HIGH (ref 22–32)
CREATININE: 1.13 mg/dL — AB (ref 0.44–1.00)
Chloride: 89 mmol/L — ABNORMAL LOW (ref 101–111)
GFR calc Af Amer: >60 mL/min (ref >60)
GFR, EST NON AFRICAN AMERICAN: 56 mL/min — AB (ref >60)
GLUCOSE: 187 mg/dL — AB (ref 65–99)
Potassium: 4.3 mmol/L (ref 3.5–5.1)
SODIUM: 133 mmol/L — AB (ref 135–145)

## 2015-07-09 LAB — GLUCOSE, CAPILLARY
GLUCOSE-CAPILLARY: 133 mg/dL — AB (ref 65–99)
GLUCOSE-CAPILLARY: 121 mg/dL — AB (ref 65–99)
Glucose-Capillary: 167 mg/dL — ABNORMAL HIGH (ref 65–99)
Glucose-Capillary: 124 mg/dL — ABNORMAL HIGH (ref 65–99)

## 2015-07-09 LAB — MAGNESIUM: Magnesium: 2.2 mg/dL (ref 1.7–2.4)

## 2015-07-09 MED ORDER — INSULIN GLARGINE 100 UNIT/ML ~~LOC~~ SOLN
10.0000 [IU] | Freq: Every day | SUBCUTANEOUS | Status: DC
  Administered 2015-07-09 – 2015-07-12 (×4): 10 [IU] via SUBCUTANEOUS
  Filled 2015-07-09 (×4): qty 0.1

## 2015-07-09 MED ORDER — HYDROMORPHONE HCL 1 MG/ML IJ SOLN: 0.5000 mg | INTRAMUSCULAR | Status: DC | PRN

## 2015-07-09 MED ORDER — ALUM & MAG HYDROXIDE-SIMETH 200-200-20 MG/5ML PO SUSP
30.0000 mL | Freq: Once | ORAL | Status: AC
  Administered 2015-07-09: 30 mL via ORAL
  Filled 2015-07-09: qty 30

## 2015-07-09 NOTE — Progress Notes (Signed)
Pt has no reports of emesis today thus far.  Still declines NGT for nausea. Will continue to monitor.

## 2015-07-09 NOTE — Progress Notes (Addendum)
Patient ID: Sarah [name], female   DOB: May 09, 1966, 50 y.o.   MRN: [id number] 3 Days Post-Op  Subjective: Still with nausea and vomiting. Has not taken anything by mouth. No flatus or bowel movements yet. Has been up walking. Some incisional pain after vomiting but otherwise none  Objective: Vital signs in last 24 hours: Temp:  [97.7 F (36.5 C)-98.1 F (36.7 C)] 98 F (36.7 C) (01/29 0600) Pulse Rate:  [73-87] 87 (01/29 0600) Resp:  [16] 16 (01/29 0600) BP: (127-149)/(78-85) 149/85 mmHg (01/29 0600) SpO2:  [92 %-100 %] 100 % (01/29 0600) Last BM Date: 07/05/15  Intake/Output from previous day: 01/28 0701 - 01/29 0700 In: 2013.3 [P.O.:20; I.V.:1973.3] Out: 970 [Urine:800; Drains:170] Intake/Output this shift: Total I/O In: -  Out: 40 [Drains:40]  General appearance: alert, cooperative, no distress and morbidly obese Resp: clear to auscultation bilaterally GI: normal findings: soft, non-tender and JP drainage serosanguineous Incision/Wound: slight old bloody drainage  under dressing without erythema  Lab Results:   Recent Labs  07/08/15 1032 07/09/15 0536  WBC 16.4* 13.2*  HGB 12.9 12.0  HCT 38.1 37.3  PLT 339 325   BMET  Recent Labs  07/08/15 1032 07/09/15 0536  NA 135 133*  K 3.7 4.3  CL 90* 89*  CO2 30 34*  GLUCOSE 194* 187*  BUN 18 17  CREATININE 1.28* 1.13*  CALCIUM 9.3 9.2   CBG (last 3)   Recent Labs  07/08/15 1335 07/08/15 1647 07/08/15 2230  GLUCAP 170* 178* 159*      Studies/Results: No results found.  Anti-infectives: Anti-infectives    Start     Dose/Rate Route Frequency Ordered Stop   07/06/15 0732  cefOXitin (MEFOXIN) 2 g in dextrose 5 % 50 mL IVPB     2 g 100 mL/hr over 30 Minutes Intravenous On call to O.R. 07/06/15 0732 07/06/15 1153      Assessment/Plan: s/p Procedure(s): EXPLORATORY LAPAROTOMY  LYSIS OF ADHESION RESECTION OF MESENTERIC MASS BOWEL RESECTION  INCISIONAL HERNIA REPAIR  WITH INSERTION OF PHASIX ST  MESH  Appears to have continued postoperative ileus. We will check KUB today. Instructed patient to hold off on diet until nausea resolves.  Might require NG tube if continued nausea and vomiting. Mild acute renal insufficiency postop, creatinine improved and urine output okay AODM. Blood sugars running a little high. Will add Lantus.    LOS: 3 days    Lindaann Gradilla T 07/09/2015

## 2015-07-09 NOTE — Progress Notes (Signed)
3 Days Post-Op Procedure(s) (LRB): EXPLORATORY LAPAROTOMY (N/A)  LYSIS OF ADHESION RESECTION OF MESENTERIC MASS BOWEL RESECTION  (N/A) INCISIONAL HERNIA REPAIR  (N/A) WITH INSERTION OF PHASIX ST MESH (N/A)  Subjective: 4 episodes of emesis over past 24 hours. Has been sipping water and ginger ale. Feels nauseated. No flatus. Pain not severe.  Objective: Vital signs in last 24 hours: Temp:  [97.7 F (36.5 C)-98.1 F (36.7 C)] 98 F (36.7 C) (01/29 0600) Pulse Rate:  [73-87] 87 (01/29 0600) Resp:  [16] 16 (01/29 0600) BP: (127-149)/(78-85) 149/85 mmHg (01/29 0600) SpO2:  [92 %-100 %] 100 % (01/29 0600) Last BM Date: 07/05/15  Intake/Output from previous day: 01/28 0701 - 01/29 0700 In: 2013.3 [P.O.:20; I.V.:1973.3] Out: 970 [Urine:800; Drains:170]  Physical Examination: General: alert, cooperative and no distress Resp: clear to auscultation bilaterally Cardio: regular rate and rhythm, S1, S2 normal, no murmur, click, rub or gallop GI: incision: midline incision with honeycomb dressing intact with no evidence of drainage, binder in place, JP drain stripped, and abdomen obese, soft, slightly-distended, hypoactive bowel sounds, more bowel sounds in the lower quadrants Extremities: extremities normal, atraumatic, no cyanosis or edema  Imaging:  KUB 07/09/15 revealed dilated mid loops of bowel. Ileus vs obstruction.  Labs: WBC/Hgb/Hct/Plts:  13.2/12.0/37.3/325 (01/29 0536) BUN/Cr/glu/ALT/AST/amyl/lip:  17/1.13/--/--/--/--/-- (01/29 0536) Today's pending Assessment: 50 y.o. s/p Procedure(s): EXPLORATORY LAPAROTOMY  LYSIS OF ADHESION RESECTION OF MESENTERIC MASS BOWEL RESECTION  INCISIONAL HERNIA REPAIR  WITH INSERTION OF PHASIX ST MESH: stable Pain:  Pain is well-controlled on PRN medications. Will need IV dilaudid only while NPO.   Heme: Hgb 11.8 and Hct 35.6 yesterday. Today's pending but vitals not suggestive of ongoing leeding  CV: BP and HR stable post-operatively.   Continue to monitor with routine vital sign assessments.  Lisinopril, HCTZ, and Metoprolol ordered (but not absorbed at present due to ileus).  GI:  Postoperative delayed return of bowel function: expected/anticipated due to extensive adhesiolysis and bowel resection. Recommend continue IVF's and NPO status until flatus. I offered/recommended an NGT for the patient however she declined for now. Wishes to see what happens with emesis if she is strictly NPO (no sips). If she has more emesis today, is agreeable to NGT placement.  GU: Adequate output reported.  Voiding. Improved creatinine on labs.  FEN: Given NPO with ileus, will continue to check 'lytes daily and replete prn. IVF's running at 125 while NPO.  Endo: Hemoglobin A1C pending.  Continue insulin sliding scale prn  Prophylaxis: pharmacologic prophylaxis (with any of the following: enoxaparin (Lovenox) 28m SQ 2 hours prior to surgery then every day) and intermittent pneumatic compression boots.  Plan: Diet NPO until flatus for ileus/delayed return of GI function. Recommend NGT if further emesis. Follow up on A1C results.  Consult diabetes coordinator. Monitor CBGs Ac/HS with sliding scale coverage Encourage ambulation, IS use, deep breathing, and coughing Continue IVF at 125 until able to take in oral. Lovenox teaching kit for discharge - for 28 days postop.    LOS: 3 days    Sarah Eva1/29/2017, 9:48 AM

## 2015-07-10 LAB — CBC
HEMATOCRIT: 36.6 % (ref 36.0–46.0)
Hemoglobin: 11.9 g/dL — ABNORMAL LOW (ref 12.0–15.0)
MCH: 30.4 pg (ref 26.0–34.0)
MCHC: 32.5 g/dL (ref 30.0–36.0)
MCV: 93.6 fL (ref 78.0–100.0)
Platelets: 311 10*3/uL (ref 150–400)
RBC: 3.91 MIL/uL (ref 3.87–5.11)
RDW: 13.8 % (ref 11.5–15.5)
WBC: 9.4 10*3/uL (ref 4.0–10.5)

## 2015-07-10 LAB — BASIC METABOLIC PANEL
Anion gap: 11 (ref 5–15)
BUN: 11 mg/dL (ref 6–20)
CHLORIDE: 91 mmol/L — AB (ref 101–111)
CO2: 30 mmol/L (ref 22–32)
CREATININE: 1.18 mg/dL — AB (ref 0.44–1.00)
Calcium: 8.9 mg/dL (ref 8.9–10.3)
GFR calc Af Amer: >60 mL/min (ref >60)
GFR calc non Af Amer: 53 mL/min — ABNORMAL LOW (ref >60)
GLUCOSE: 163 mg/dL — AB (ref 65–99)
Potassium: 4.2 mmol/L (ref 3.5–5.1)
Sodium: 132 mmol/L — ABNORMAL LOW (ref 135–145)

## 2015-07-10 LAB — GLUCOSE, CAPILLARY
GLUCOSE-CAPILLARY: 137 mg/dL — AB (ref 65–99)
Glucose-Capillary: 132 mg/dL — ABNORMAL HIGH (ref 65–99)
Glucose-Capillary: 164 mg/dL — ABNORMAL HIGH (ref 65–99)
Glucose-Capillary: 143 mg/dL — ABNORMAL HIGH (ref 65–99)

## 2015-07-10 LAB — MAGNESIUM: Magnesium: 2 mg/dL (ref 1.7–2.4)

## 2015-07-10 MED ORDER — PANTOPRAZOLE SODIUM 40 MG IV SOLR
40.0000 mg | INTRAVENOUS | Status: DC
  Administered 2015-07-10 – 2015-07-12 (×3): 40 mg via INTRAVENOUS
  Filled 2015-07-10 (×3): qty 40

## 2015-07-10 MED ORDER — POTASSIUM CHLORIDE 2 MEQ/ML IV SOLN
INTRAVENOUS | Status: DC
  Administered 2015-07-10 (×2): via INTRAVENOUS
  Filled 2015-07-10 (×3): qty 1000

## 2015-07-10 MED ORDER — PHENOL 1.4 % MT LIQD
1.0000 | OROMUCOSAL | Status: DC | PRN
  Administered 2015-07-10: 1 via OROMUCOSAL
  Filled 2015-07-10: qty 177

## 2015-07-10 MED ORDER — ENOXAPARIN (LOVENOX) PATIENT EDUCATION KIT
PACK | Freq: Once | Status: AC
  Filled 2015-07-10: qty 1

## 2015-07-10 NOTE — Progress Notes (Signed)
4 Days Post-Op Procedure(s) (LRB): EXPLORATORY LAPAROTOMY (N/A)  LYSIS OF ADHESION RESECTION OF MESENTERIC MASS BOWEL RESECTION  (N/A) INCISIONAL HERNIA REPAIR  (N/A) WITH INSERTION OF PHASIX ST MESH (N/A)  Subjective: Persistent emesis and no flatus. NGT was placed 230cc out since placement. Patient feels unchanged. Moderate pain, not severe. Feels "rumbling" in abdomen  Objective: Vital signs in last 24 hours: Temp:  [97.9 F (36.6 C)-99.4 F (37.4 C)] 97.9 F (36.6 C) (01/30 0635) Pulse Rate:  [74-95] 95 (01/30 0922) Resp:  [16-18] 18 (01/30 0635) BP: (131-167)/(76-88) 153/82 mmHg (01/30 0923) SpO2:  [91 %-100 %] 97 % (01/30 0635) Last BM Date: 07/05/15  Intake/Output from previous day: 01/29 0701 - 01/30 0700 In: 2979.2 [I.V.:2979.2] Out: 2615 [Urine:2250; Emesis/NG output:250; Drains:115]  Physical Examination: General: alert, cooperative and no distress Resp: clear to auscultation bilaterally Cardio: regular rate and rhythm, S1, S2 normal, no murmur, click, rub or gallop GI: incision: midline incision with honeycomb dressing intact with no evidence of drainage, binder in place, JP drain stripped, and abdomen obese, soft, slightly-distended, hypoactive bowel sounds, more bowel sounds in the lower quadrants Extremities: extremities normal, atraumatic, no cyanosis or edema  Imaging:  KUB 07/09/15 revealed dilated mid loops of bowel. Ileus vs obstruction.  Labs: WBC/Hgb/Hct/Plts:  9.4/11.9/36.6/311 (01/30 0539) BUN/Cr/glu/ALT/AST/amyl/lip:  11/1.18/--/--/--/--/-- (01/30 0539) Today's pending Assessment: 50 y.o. s/p Procedure(s): EXPLORATORY LAPAROTOMY  LYSIS OF ADHESION RESECTION OF MESENTERIC MASS BOWEL RESECTION  INCISIONAL HERNIA REPAIR  WITH INSERTION OF PHASIX ST MESH: stable Pain:  Pain is well-controlled on PRN medications. Will need IV dilaudid only while NPO.   Heme: Hgb stable at 11.9. Today's pending but vitals not suggestive of ongoing bleeding  CV: BP  and HR stable post-operatively.  Continue to monitor with routine vital sign assessments.  Lisinopril, HCTZ, and Metoprolol ordered (but not absorbed at present due to ileus).  GI:  Postoperative delayed return of bowel function: expected/anticipated due to extensive adhesiolysis and bowel resection. Recommend continue IVF's and NPO status and NGT until flatus. Reassuring normalization of WBC. Continue expectant management for now.  GU: Adequate output reported.  Voiding. Improved creatinine on labs.  FEN: Given NPO with ileus, will continue to check 'lytes daily and replete prn. Hyponatremia. Changed IVF to D5 0.9NS + 20KCl  Endo: Hemoglobin A1C pending.  Continue insulin sliding scale prn  Prophylaxis: pharmacologic prophylaxis (with any of the following: enoxaparin (Lovenox) 31m SQ 2 hours prior to surgery then every day) and intermittent pneumatic compression boots.  Plan: Diet NPO until flatus for ileus/delayed return of GI function.  Protonix for dyspepsia Monitor CBGs Ac/HS with sliding scale coverage Encourage ambulation, IS use, deep breathing, and coughing Continue IVF at 125 until able to take in oral. Lovenox teaching kit for discharge - for 28 days postop.    LOS: 4 days    RDonaciano Eva1/30/2017, 9:26 AM

## 2015-07-10 NOTE — Progress Notes (Signed)
4 Days Post-Op  Subjective: No flatus, ng in overnight, no nausea now, ambulated twice yesterday  Objective: Vital signs in last 24 hours: Temp:  [98.2 F (36.8 C)-99.4 F (37.4 C)] 99.4 F (37.4 C) 2022-07-19 2215) Pulse Rate:  [74-89] 89 July 19, 2022 2215) Resp:  [16-18] 18 07-19-22 2215) BP: (131-167)/(76-84) 167/84 mmHg 07/19/2022 2215) SpO2:  [91 %-100 %] 91 % 2022/07/19 2215) Last BM Date: 07/05/15  Intake/Output from previous day: 07/19/2022 0701 - 01/30 0700 In: 2979.2 [I.V.:2979.2] Out: 2155 [Urine:1850; Emesis/NG output:200; Drains:105] Intake/Output this shift: Total I/O In: 1500 [I.V.:1500] Out: 1715 [Urine:1500; Emesis/NG output:200; Drains:15]  GI: obese, no real bowel sounds heard, drain serosang, incision without infection or drainage, approp tender  Lab Results:   Recent Labs  20-Jul-2015 0536 07/10/15 0539  WBC 13.2* 9.4  HGB 12.0 11.9*  HCT 37.3 36.6  PLT 325 311   BMET  Recent Labs  07/20/2015 0536 07/10/15 0539  NA 133* 132*  K 4.3 4.2  CL 89* 91*  CO2 34* 30  GLUCOSE 187* 163*  BUN 17 11  CREATININE 1.13* 1.18*  CALCIUM 9.2 8.9   PT/INR No results for input(s): LABPROT, INR in the last 72 hours. ABG No results for input(s): PHART, HCO3 in the last 72 hours.  Invalid input(s): PCO2, PO2  Studies/Results: Dg Abd 2 Views  2015/07/20  CLINICAL DATA:  Postoperative nausea and vomiting. Abdominal pain for 3 days. EXAM: ABDOMEN - 2 VIEW COMPARISON:  CT 04/27/2015 FINDINGS: Surgical drain in the mid abdomen. Mild gaseous distention of small bowel loops. No free air. No organomegaly or suspicious calcification. IMPRESSION: Mildly prominent mid abdominal small bowel loops with scattered air-fluid levels. This could. Cannot completely exclude reflect postoperative ileus small bowel obstruction. Electronically Signed   By: Rolm Baptise M.D.   On: July 20, 2015 08:54    Anti-infectives: Anti-infectives    Start     Dose/Rate Route Frequency Ordered Stop   07/06/15 0732   cefOXitin (MEFOXIN) 2 g in dextrose 5 % 50 mL IVPB     2 g 100 mL/hr over 30 Minutes Intravenous On call to O.R. 07/06/15 0732 07/06/15 1153      Assessment/Plan: POD 4 sbr, loa, preperitoneal mesh repair of incisional hernia  Appears to have postoperative ileus, agree with ngt and time, wbc normal, vitals ok Monitor creatinine as still mildly elevated Ideally glucose under some better control, diabetes coordinator following pulm toilet, up as much as possible lovenox Will dc drain before discharge  [facility] 07/10/2015

## 2015-07-10 NOTE — Progress Notes (Signed)
Inpatient Diabetes Program Recommendations  AACE/ADA: New Consensus Statement on Inpatient Glycemic Control (2015)  Target Ranges:  Prepandial:   less than 140 mg/dL      Peak postprandial:   less than 180 mg/dL (1-2 hours)      Critically ill patients:  140 - 180 mg/dL   Review of Glycemic Control  Results for Sarah [name], Sarah [name] (MRN [id number]) as of 07/10/2015 11:55  Ref. Range 07/09/2015 07:50 07/09/2015 11:56 07/09/2015 16:06 07/09/2015 21:35 07/10/2015 07:52  Glucose-Capillary Latest Ref Range: 65-99 mg/dL 167 (H) 133 (H) 121 (H) 124 (H) 132 (H)  Results for Sarah [name], Sarah [name] (MRN [id number]) as of 07/10/2015 11:55  Ref. Range 07/07/2015 04:33  Hemoglobin A1C Latest Ref Range: 4.8-5.6 % 10.1 (H)   Blood sugars trending well with Lantus 10 units QAM. HgbA1C 10.1%. Will likely need to go home on basal insulin.  Will contact MD for clarification.  Continue to follow. Thank you. Lorenda Peck, RD, LDN, CDE Inpatient Diabetes Coordinator [phone]

## 2015-07-10 NOTE — Progress Notes (Signed)
Pt refused her 12 MN meds because she stated she " wasn't in pain and did not want to throw up."  I asked her if she wanted more anti nausea med, and she said she didn't and she "was ready for the tube. Earlier in the day the MD had advised the pt that if her nausea continued, or she had emesis, she would need th NG tube.  I inserted the tube with no difficulty, and it's placement was verified by Laurell Roof RN. Pt tolerate procedure well..About 200 cc brown stool and clear fluid was suctioned out immediately while tube gauge was set to intemittent low wall suction.

## 2015-07-10 NOTE — Plan of Care (Signed)
Problem: Nutrition: Goal: Adequate nutrition will be maintained Outcome: Not Progressing NGT for ileus

## 2015-07-11 LAB — GLUCOSE, CAPILLARY
GLUCOSE-CAPILLARY: 123 mg/dL — AB (ref 65–99)
Glucose-Capillary: 130 mg/dL — ABNORMAL HIGH (ref 65–99)
Glucose-Capillary: 127 mg/dL — ABNORMAL HIGH (ref 65–99)
Glucose-Capillary: 139 mg/dL — ABNORMAL HIGH (ref 65–99)

## 2015-07-11 LAB — MAGNESIUM: Magnesium: 1.9 mg/dL (ref 1.7–2.4)

## 2015-07-11 MED ORDER — ENOXAPARIN SODIUM 40 MG/0.4ML ~~LOC~~ SOLN: 40.0000 mg | SUBCUTANEOUS | Status: DC

## 2015-07-11 MED ORDER — OXYCODONE HCL 5 MG PO TABS
5.0000 mg | ORAL_TABLET | ORAL | Status: DC | PRN
Start: 2015-07-11 — End: 2015-08-16

## 2015-07-11 MED FILL — LOVENOX 40 MG PREFILLED SYR: 40 | 25 days supply | Qty: 10 | Fill #0

## 2015-07-11 NOTE — Discharge Instructions (Addendum)
07/11/2015  Return to work: 4-6 weeks if applicable  Activity: 1. Be up and out of the bed during the day.  Take a nap if needed.  You may walk up steps but be careful and use the hand rail.  Stair climbing will tire you more than you think, you may need to stop part way and rest.   2. No lifting or straining for 6 weeks.  3. No driving for 1 week(s).  Do not drive if you are taking narcotic pain medicine.  4. Shower daily.  Use soap and water on your incision and pat dry; don't rub.  No tub baths until cleared by your surgeon.   5. You may experience a small amount of clear drainage from your incisions, which is normal.  If the drainage persists or increases, please call the office.  Diet: 1. Low sodium Heart Healthy Diet is recommended.  Wound Care: 1. Keep clean and dry.  Shower daily.  Reasons to call the Doctor:  Fever - Oral temperature greater than 100.4 degrees Fahrenheit  Foul-smelling vaginal discharge  Difficulty urinating  Nausea and vomiting  Increased pain at the site of the incision that is unrelieved with pain medicine.  Difficulty breathing with or without chest pain  New calf pain especially if only on one side  Sudden, continuing increased vaginal bleeding with or without clots.   Contacts: For questions or concerns you should contact:  Dr. Everitt Amber at [phone]  Joylene John, NP at [phone]  After Hours: call [phone] and have the GYN Oncologist paged/contacted  Holt A bulb drain consists of a thin rubber tube and a soft, round bulb that creates a gentle suction. The rubber tube is placed in the area where you had surgery. A bulb is attached to the end of the tube that is outside the body. The bulb drain removes excess fluid that normally builds up in a surgical wound after surgery. The color and amount of fluid will vary. Immediately after surgery, the fluid is bright red and is a little thicker than water. It may  gradually change to a yellow or pink color and become more thin and water-like. When the amount decreases to about 1 or 2 tbsp in 24 hours, your health care provider will usually remove it. DAILY CARE  Keep the bulb flat (compressed) at all times, except while emptying it. The flatness creates suction. You can flatten the bulb by squeezing it firmly in the middle and then closing the cap.  Keep sites where the tube enters the skin dry and covered with a bandage (dressing).  Secure the tube 1-2 in (2.5-5.1 cm) below the insertion sites to keep it from pulling on your stitches. The tube is stitched in place and will not slip out.  Secure the bulb as directed by your health care provider.  For the first 3 days after surgery, there usually is more fluid in the bulb. Empty the bulb whenever it becomes half full because the bulb does not create enough suction if it is too full. The bulb could also overflow. Write down how much fluid you remove each time you empty your drain. Add up the amount removed in 24 hours.  Empty the bulb at the same time every day once the amount of fluid decreases and you only need to empty it once a day. Write down the amounts and the 24-hour totals to give to your health care provider. This helps your health care provider  know when the tubes can be removed. EMPTYING THE BULB DRAIN Before emptying the bulb, get a measuring cup, a piece of paper and a pen, and wash your hands.  Gently run your fingers down the tube (stripping) to empty any drainage from the tubing into the bulb. This may need to be done several times a day to clear the tubing of clots and tissue.  Open the bulb cap to release suction, which causes it to inflate. Do not touch the inside of the cap.  Gently run your fingers down the tube (stripping) to empty any drainage from the tubing into the bulb.  Hold the cap out of the way, and pour fluid into the measuring cup.   Squeeze the bulb to provide  suction.  Replace the cap.   Check the tape that holds the tube to your skin. If it is becoming loose, you can remove the loose piece of tape and apply a new one. Then, pin the bulb to your shirt.   Write down the amount of fluid you emptied out. Write down the date and each time you emptied your bulb drain. (If there are 2 bulbs, note the amount of drainage from each bulb and keep the totals separate. Your health care provider will want to know the total amounts for each drain and which tube is draining more.)   Flush the fluid down the toilet and wash your hands.   Call your health care provider once you have less than 2 tbsp of fluid collecting in the bulb drain every 24 hours. If there is drainage around the tube site, change dressings and keep the area dry. Cleanse around tube with sterile saline and place dry gauze around site. This gauze should be changed when it is soiled. If it stays clean and unsoiled, it should still be changed daily.  SEEK MEDICAL CARE IF:  Your drainage has a bad smell or is cloudy.   You have a fever.   Your drainage is increasing instead of decreasing.   Your tube fell out.   You have redness or swelling around the tube site.   You have drainage from a surgical wound.   Your bulb drain will not stay flat after you empty it.  MAKE SURE YOU:   Understand these instructions.  Will watch your condition.  Will get help right away if you are not doing well or get worse.   This information is not intended to replace advice given to you by your health care provider. Make sure you discuss any questions you have with your health care provider.   Document Released: 05/24/2000 Document Revised: 06/17/2014 Document Reviewed: 12/14/2014 Elsevier Interactive Patient Education 2016 [employer].  Oxycodone tablets or capsules What is this medicine? OXYCODONE (ox i KOE done) is a pain reliever. It is used to treat moderate to severe pain. This  medicine may be used for other purposes; ask your health care provider or pharmacist if you have questions. What should I tell my health care provider before I take this medicine? They need to know if you have any of these conditions: -Addison's disease -brain tumor -head injury -heart disease -history of drug or alcohol abuse problem -if you often drink alcohol -kidney disease -liver disease -lung or breathing disease, like asthma -mental illness -pancreatic disease -seizures -thyroid disease -an unusual or allergic reaction to oxycodone, codeine, hydrocodone, morphine, other medicines, foods, dyes, or preservatives -pregnant or trying to get pregnant -breast-feeding How should I use this  medicine? Take this medicine by mouth with a glass of water. Follow the directions on the prescription label. You can take it with or without food. If it upsets your stomach, take it with food. Take your medicine at regular intervals. Do not take it more often than directed. Do not stop taking except on your doctor's advice. Some brands of this medicine, like Oxecta, have special instructions. Ask your doctor or pharmacist if these directions are for you: Do not cut, crush or chew this medicine. Swallow only one tablet at a time. Do not wet, soak, or lick the tablet before you take it. Talk to your pediatrician regarding the use of this medicine in children. Special care may be needed. Overdosage: If you think you have taken too much of this medicine contact a poison control center or emergency room at once. NOTE: This medicine is only for you. Do not share this medicine with others. What if I miss a dose? If you miss a dose, take it as soon as you can. If it is almost time for your next dose, take only that dose. Do not take double or extra doses. What may interact with this medicine? -alcohol -antihistamines -certain medicines used for nausea like chlorpromazine,  droperidol -erythromycin -ketoconazole -medicines for depression, anxiety, or psychotic disturbances -medicines for sleep -muscle relaxants -naloxone -naltrexone -narcotic medicines (opiates) for pain -nilotinib -phenobarbital -phenytoin -rifampin -ritonavir -voriconazole This list may not describe all possible interactions. Give your health care provider a list of all the medicines, herbs, non-prescription drugs, or dietary supplements you use. Also tell them if you smoke, drink alcohol, or use illegal drugs. Some items may interact with your medicine. What should I watch for while using this medicine? Tell your doctor or health care professional if your pain does not go away, if it gets worse, or if you have new or a different type of pain. You may develop tolerance to the medicine. Tolerance means that you will need a higher dose of the medicine for pain relief. Tolerance is normal and is expected if you take this medicine for a long time. Do not suddenly stop taking your medicine because you may develop a severe reaction. Your body becomes used to the medicine. This does NOT mean you are addicted. Addiction is a behavior related to getting and using a drug for a non-medical reason. If you have pain, you have a medical reason to take pain medicine. Your doctor will tell you how much medicine to take. If your doctor wants you to stop the medicine, the dose will be slowly lowered over time to avoid any side effects. You may get drowsy or dizzy when you first start taking this medicine or change doses. Do not drive, use machinery, or do anything that may be dangerous until you know how the medicine affects you. Stand or sit up slowly. There are different types of narcotic medicines (opiates) for pain. If you take more than one type at the same time, you may have more side effects. Give your health care provider a list of all medicines you use. Your doctor will tell you how much medicine to take.  Do not take more medicine than directed. Call emergency for help if you have problems breathing. This medicine will cause constipation. Try to have a bowel movement at least every 2 to 3 days. If you do not have a bowel movement for 3 days, call your doctor or health care professional. Your mouth may get dry. Drinking water,  chewing sugarless gum, or sucking on hard candy may help. See your dentist every 6 months. What side effects may I notice from receiving this medicine? Side effects that you should report to your doctor or health care professional as soon as possible: -allergic reactions like skin rash, itching or hives, swelling of the face, lips, or tongue -breathing problems -confusion -feeling faint or lightheaded, falls -trouble passing urine or change in the amount of urine -unusually weak or tired Side effects that usually do not require medical attention (report to your doctor or health care professional if they continue or are bothersome): -constipation -dry mouth -itching -nausea, vomiting -upset stomach This list may not describe all possible side effects. Call your doctor for medical advice about side effects. You may report side effects to FDA at 1-800-FDA-1088. Where should I keep my medicine? Keep out of the reach of children. This medicine can be abused. Keep your medicine in a safe place to protect it from theft. Do not share this medicine with anyone. Selling or giving away this medicine is dangerous and against the law. Store at room temperature between 15 and 30 degrees C (59 and 86 degrees F). Protect from light. Keep container tightly closed. This medicine may cause accidental overdose and death if it is taken by other adults, children, or pets. Flush any unused medicine down the toilet to reduce the chance of harm. Do not use the medicine after the expiration date. NOTE: This sheet is a summary. It may not cover all possible information. If you have questions about  this medicine, talk to your doctor, pharmacist, or health care provider.    2016, Elsevier/Gold Standard. (2014-10-08 01:15:14)  How and Where to Give Subcutaneous Enoxaparin Injections Enoxaparin is an injectable medicine. It is used to help prevent blood clots from developing in your veins. Health care providers often use anticoagulants like enoxaparin to prevent clots following surgery. Enoxaparin is also used in combination with other medicines to treat blood clots and heart attacks. If blood clots are left untreated, they can be life threatening.  Enoxaparin comes in single-use syringes. You inject enoxaparin through a syringe into your belly (abdomen). You should change the injection site each time you give yourself a shot. Continue the enoxaparin injections as directed by your health care provider. Your health care provider will use blood clotting test results to decide when you can safely stop using enoxaparin injections. If your health care provider prescribes any additional medicines, use the medicines exactly as directed. HOW DO I INJECT ENOXAPARIN?   Wash your hands with soap and water.  Clean the selected injection site as directed by your health care provider.  Remove the needle cap by pulling it straight off the syringe.  Hold the syringe like a pencil using your writing hand.  Use your other hand to pinch and hold an inch of the cleansed skin.  Insert the entire needle straight down into the fold of skin.  Push the plunger with your thumb until the syringe is empty.  Pull the needle straight out of your skin.  Enoxaparin injection prefilled syringes and graduated prefilled syringes are available with a system that shields the needle after injection. After you have completed your injection and removed the needle from your skin, firmly push down on the plunger. The protective sleeve will automatically cover the needle and you will hear a click. The click means the needle is  safely covered.  Place the syringe in the nearest needle box, also called  a sharps container. If you do not have a sharps container, you can use a hard-sided plastic container with a secure lid, such as an empty laundry detergent bottle. WHAT ELSE DO I NEED TO KNOW?  Do not use enoxaparin if:  You have allergies to heparin or pork products.  You have been diagnosed with a condition called thrombocytopenia.  Do not use the syringe or needle more than one time.  Use medicines only as directed by your health care provider.  Changes in medicines, supplements, diet, and illness can affect your anticoagulation therapy. Be sure to inform your health care provider of any of these changes.  It is important that you tell all of your health care providers and your dentist that you are taking an anticoagulant, especially if you are injured or plan to have any type of procedure.  While on anticoagulants, you will need to have blood tests done routinely as directed by your health care provider.  While using this medicine, avoid physical activities or sports that could result in a fall or cause injury.  Follow up with your laboratory test and health care provider appointments as directed. It is very important to keep your appointments. Not keeping appointments could result in a chronic or permanent injury, pain, or disability.  Before giving your medicine, you should make sure the injection is a clear and colorless or pale yellow solution. If your medicine becomes discolored or if there are particles in the syringe, do not use it and notify your health care provider.  Keep your medicine safely stored at room temperature. SEEK MEDICAL CARE IF:  You develop any rashes on your skin.  You have large areas of bruising on your skin.  You have any worsening of the condition for which you take Enoxaparin.  You develop a fever. SEEK IMMEDIATE MEDICAL CARE IF:  You develop bleeding problems such  as:  Bleeding from the gums or nose that does not stop quickly.  Vomiting blood or coughing up blood.  Blood in your urine.  Blood in your stool, or stool that has a dark, tarry, or coffee grounds appearance.  A cut that does not stop bleeding within 10 minutes. These symptoms may represent a serious problem that is an emergency. Do not wait to see if the symptoms will go away. Get medical help right away. Call your local emergency services (911 in the [place] Do not drive yourself to the hospital.    This information is not intended to replace advice given to you by your health care provider. Make sure you discuss any questions you have with your health care provider.   Document Released: 03/28/2004 Document Revised: 06/17/2014 Document Reviewed: 11/11/2013 Elsevier Interactive Patient Education 2016 [place].  Insulin Glargine injection What is this medicine? INSULIN GLARGINE (IN su lin GLAR geen) is a human-made form of insulin. This drug lowers the amount of sugar in your blood. It is a long-acting insulin that is usually given once a day. This medicine may be used for other purposes; ask your health care provider or pharmacist if you have questions. What should I tell my health care provider before I take this medicine? They need to know if you have any of these conditions: -episodes of hypoglycemia -kidney disease -liver disease -an unusual or allergic reaction to insulin, metacresol, other medicines, foods, dyes, or preservatives -pregnant or trying to get pregnant -breast-feeding How should I use this medicine? This medicine is for injection under the skin. Use this medicine  at the same time each day. Use exactly as directed. This insulin should never be mixed in the same syringe with other insulins before injection. Do not vigorously shake before use. You will be taught how to use this medicine and how to adjust doses for activities and illness. Do not use more insulin  than prescribed. Always check the appearance of your insulin before using it. This medicine should be clear and colorless like water. Do not use it if it is cloudy, thickened, colored, or has solid particles in it. It is important that you put your used needles and syringes in a special sharps container. Do not put them in a trash can. If you do not have a sharps container, call your pharmacist or healthcare provider to get one. Talk to your pediatrician regarding the use of this medicine in children. Special care may be needed. Overdosage: If you think you have taken too much of this medicine contact a poison control center or emergency room at once. NOTE: This medicine is only for you. Do not share this medicine with others. What if I miss a dose? It is important not to miss a dose. Your health care professional or doctor should discuss a plan for missed doses with you. If you do miss a dose, follow their plan. Do not take double doses. What may interact with this medicine? -other medicines for diabetes Many medications may cause an increase or decrease in blood sugar, these include: -alcohol containing beverages -aspirin and aspirin-like drugs -chloramphenicol -chromium -diuretics -female hormones, like estrogens or progestins and birth control pills -heart medicines -isoniazid -female hormones or anabolic steroids -medicines for weight loss -medicines for allergies, asthma, cold, or cough -medicines for mental problems -medicines called MAO Inhibitors like Nardil, Parnate, Marplan, Eldepryl -niacin -NSAIDs, medicines for pain and inflammation, like ibuprofen or naproxen -pentamidine -phenytoin -probenecid -quinolone antibiotics like ciprofloxacin, levofloxacin, ofloxacin -some herbal dietary supplements -steroid medicines like prednisone or cortisone -thyroid medicine Some medications can hide the warning symptoms of low blood sugar. You may need to monitor your blood sugar more  closely if you are taking one of these medications. These include: -beta-blockers such as atenolol, metoprolol, propranolol -clonidine -guanethidine -reserpine This list may not describe all possible interactions. Give your health care provider a list of all the medicines, herbs, non-prescription drugs, or dietary supplements you use. Also tell them if you smoke, drink alcohol, or use illegal drugs. Some items may interact with your medicine. What should I watch for while using this medicine? Visit your health care professional or doctor for regular checks on your progress. Do not drive, use machinery, or do anything that needs mental alertness until you know how this medicine affects you. Alcohol may interfere with the effect of this medicine. Avoid alcoholic drinks. A test called the HbA1C (A1C) will be monitored. This is a simple blood test. It measures your blood sugar control over the last 2 to 3 months. You will receive this test every 3 to 6 months. Learn how to check your blood sugar. Learn the symptoms of low and high blood sugar and how to manage them. Always carry a quick-source of sugar with you in case you have symptoms of low blood sugar. Examples include hard sugar candy or glucose tablets. Make sure others know that you can choke if you eat or drink when you develop serious symptoms of low blood sugar, such as seizures or unconsciousness. They must get medical help at once. Tell your doctor or health  care professional if you have high blood sugar. You might need to change the dose of your medicine. If you are sick or exercising more than usual, you might need to change the dose of your medicine. Do not skip meals. Ask your doctor or health care professional if you should avoid alcohol. Many nonprescription cough and cold products contain sugar or alcohol. These can affect blood sugar. Make sure that you have the right kind of syringe for the type of insulin you use. Try not to change  the brand and type of insulin or syringe unless your health care professional or doctor tells you to. Switching insulin brand or type can cause dangerously high or low blood sugar. Always keep an extra supply of insulin, syringes, and needles on hand. Use a syringe one time only. Throw away syringe and needle in a closed container to prevent accidental needle sticks. Insulin pens and cartridges should never be shared. Even if the needle is changed, sharing may result in passing of viruses like hepatitis or HIV. Wear a medical ID bracelet or chain, and carry a card that describes your disease and details of your medicine and dosage times. What side effects may I notice from receiving this medicine? Side effects that you should report to your health care professional or doctor as soon as possible: -allergic reactions like skin rash, itching or hives, swelling of the face, lips, or tongue -breathing problems -signs and symptoms of high blood sugar such as dizziness, dry mouth, dry skin, fruity breath, nausea, stomach pain, increased hunger or thirst, increased urination -signs and symptoms of low blood sugar such as feeling anxious, confusion, dizziness, increased hunger, unusually weak or tired, sweating, shakiness, cold, irritable, headache, blurred vision, fast heartbeat, loss of consciousness Side effects that usually do not require medical attention (report to your health care professional or doctor if they continue or are bothersome): -increase or decrease in fatty tissue under the skin due to overuse of a particular injection site -itching, burning, swelling, or rash at site where injected This list may not describe all possible side effects. Call your doctor for medical advice about side effects. You may report side effects to FDA at 1-800-FDA-1088. Where should I keep my medicine? Keep out of the reach of children. Store unopened vials in a refrigerator between 2 and 8 degrees C (36 and 46  degrees F). Do not freeze or use if the insulin has been frozen. Opened vials (vials currently in use) may be stored in the refrigerator or at room temperature, at approximately 25 degrees C (77 degrees F) or cooler. Keeping your insulin at room temperature decreases the amount of pain during injection. Once opened, your insulin can be used for 28 days. After 28 days, the vial should be thrown away. Store L[employer] in a refrigerator between 2 and 8 degrees C (36 and 46 degrees F) or at room temperature below 30 degrees C (86 degrees F). Do not freeze or use if the insulin has been frozen. Once opened, the pens should be kept at room temperature. Do not store in the refrigerator once opened. Once opened, the insulin can be used for 28 days. After 28 days, the Lantus Solostar Pen or Basaglar KwikPen should be thrown away. Store [employer] in a refrigerator between 2 and 8 degrees C (36 and 46 degrees F). Do not freeze or use if the insulin has been frozen. Once opened, the pens should be kept at room  temperature below 30 degrees C (86 degrees F). Do not store in the refrigerator once opened. Once opened, the insulin can be used for 42 days. After 42 days, the [employer] Pen should be thrown away. Protect all insulin vials and pens from light and excessive heat. Throw away any unused medicine after the expiration date or after the specified time for room temperature storage has passed. NOTE: This sheet is a summary. It may not cover all possible information. If you have questions about this medicine, talk to your doctor, pharmacist, or health care provider.    2016, Elsevier/Gold Standard. (2014-06-06 10:12:42)  Basic Carbohydrate Counting for Diabetes Mellitus Carbohydrate counting is a method for keeping track of the amount of carbohydrates you eat. Eating carbohydrates naturally increases the level of sugar (glucose) in your blood, so it is important for you  to know the amount that is okay for you to have in every meal. Carbohydrate counting helps keep the level of glucose in your blood within normal limits. The amount of carbohydrates allowed is different for every person. A dietitian can help you calculate the amount that is right for you. Once you know the amount of carbohydrates you can have, you can count the carbohydrates in the foods you want to eat. Carbohydrates are found in the following foods:  Grains, such as breads and cereals.  Dried beans and soy products.  Starchy vegetables, such as potatoes, peas, and corn.  Fruit and fruit juices.  Milk and yogurt.  Sweets and snack foods, such as cake, cookies, candy, chips, soft drinks, and fruit drinks. CARBOHYDRATE COUNTING There are two ways to count the carbohydrates in your food. You can use either of the methods or a combination of both. Reading the "Nutrition Facts" on [place] The "Nutrition Facts" is an area that is included on the labels of almost all packaged food and beverages in the [place]. It includes the serving size of that food or beverage and information about the nutrients in each serving of the food, including the grams (g) of carbohydrate per serving.  Decide the number of servings of this food or beverage that you will be able to eat or drink. Multiply that number of servings by the number of grams of carbohydrate that is listed on the label for that serving. The total will be the amount of carbohydrates you will be having when you eat or drink this food or beverage. Learning Standard Serving Sizes of Food When you eat food that is not packaged or does not include "Nutrition Facts" on the label, you need to measure the servings in order to count the amount of carbohydrates.A serving of most carbohydrate-rich foods contains about 15 g of carbohydrates. The following list includes serving sizes of carbohydrate-rich foods that provide 15 g ofcarbohydrate per  serving:   1 slice of bread (1 oz) or 1 six-inch tortilla.    of a hamburger bun or English muffin.  4-6 crackers.   cup unsweetened dry cereal.    cup hot cereal.   cup rice or pasta.    cup mashed potatoes or  of a large baked potato.  1 cup fresh fruit or one small piece of fruit.    cup canned or frozen fruit or fruit juice.  1 cup milk.   cup plain fat-free yogurt or yogurt sweetened with artificial sweeteners.   cup cooked dried beans or starchy vegetable, such as peas, corn, or potatoes.  Decide the number of standard-size servings  that you will eat. Multiply that number of servings by 15 (the grams of carbohydrates in that serving). For example, if you eat 2 cups of strawberries, you will have eaten 2 servings and 30 g of carbohydrates (2 servings x 15 g = 30 g). For foods such as soups and casseroles, in which more than one food is mixed in, you will need to count the carbohydrates in each food that is included. EXAMPLE OF CARBOHYDRATE COUNTING Sample Dinner  3 oz chicken breast.   cup of brown rice.   cup of corn.  1 cup milk.   1 cup strawberries with sugar-free whipped topping.  Carbohydrate Calculation Step 1: Identify the foods that contain carbohydrates:   Rice.   Corn.   Milk.   Strawberries. Step 2:Calculate the number of servings eaten of each:   2 servings of rice.   1 serving of corn.   1 serving of milk.   1 serving of strawberries. Step 3: Multiply each of those number of servings by 15 g:   2 servings of rice x 15 g = 30 g.   1 serving of corn x 15 g = 15 g.   1 serving of milk x 15 g = 15 g.   1 serving of strawberries x 15 g = 15 g. Step 4: Add together all of the amounts to find the total grams of carbohydrates eaten: 30 g + 15 g + 15 g + 15 g = 75 g.   This information is not intended to replace advice given to you by your health care provider. Make sure you discuss any questions you have with  your health care provider.   Document Released: 05/27/2005 Document Revised: 06/17/2014 Document Reviewed: 04/23/2013 Elsevier Interactive Patient Education 2016 Elsevier Inc.  Blood Glucose Monitoring, Adult Monitoring your blood glucose (also know as blood sugar) helps you to manage your diabetes. It also helps you and your health care provider monitor your diabetes and determine how well your treatment plan is working. WHY SHOULD YOU MONITOR YOUR BLOOD GLUCOSE?  It can help you understand how food, exercise, and medicine affect your blood glucose.  It allows you to know what your blood glucose is at any given moment. You can quickly tell if you are having low blood glucose (hypoglycemia) or high blood glucose (hyperglycemia).  It can help you and your health care provider know how to adjust your medicines.  It can help you understand how to manage an illness or adjust medicine for exercise. WHEN SHOULD YOU TEST? Your health care provider will help you decide how often you should check your blood glucose. This may depend on the type of diabetes you have, your diabetes control, or the types of medicines you are taking. Be sure to write down all of your blood glucose readings so that this information can be reviewed with your health care provider. See below for examples of testing times that your health care provider may suggest. Type 1 Diabetes  Test at least 2 times per day if your diabetes is well controlled, if you are using an insulin pump, or if you perform multiple daily injections.  If your diabetes is not well controlled or if you are sick, you may need to test more often.  It is a good idea to also test:  Before every insulin injection.  Before and after exercise.  Between meals and 2 hours after a meal.  Occasionally between 2:00 a.m. and 3:00 a.m. Type 2  Diabetes  If you are taking insulin, test at least 2 times per day. However, it is best to test before every insulin  injection.  If you take medicines by mouth (orally), test 2 times a day.  If you are on a controlled diet, test once a day.  If your diabetes is not well controlled or if you are sick, you may need to monitor more often. HOW TO MONITOR YOUR BLOOD GLUCOSE Supplies Needed  Blood glucose meter.  Test strips for your meter. Each meter has its own strips. You must use the strips that go with your own meter.  A pricking needle (lancet).  A device that holds the lancet (lancing device).  A journal or log book to write down your results. Procedure  Wash your hands with soap and water. Alcohol is not preferred.  Prick the side of your finger (not the tip) with the lancet.  Gently milk the finger until a small drop of blood appears.  Follow the instructions that come with your meter for inserting the test strip, applying blood to the strip, and using your blood glucose meter. Other Areas to Get Blood for Testing Some meters allow you to use other areas of your body (other than your finger) to test your blood. These areas are called alternative sites. The most common alternative sites are:  The forearm.  The thigh.  The back area of the lower leg.  The palm of the hand. The blood flow in these areas is slower. Therefore, the blood glucose values you get may be delayed, and the numbers are different from what you would get from your fingers. Do not use alternative sites if you think you are having hypoglycemia. Your reading will not be accurate. Always use a finger if you are having hypoglycemia. Also, if you cannot feel your lows (hypoglycemia unawareness), always use your fingers for your blood glucose checks. ADDITIONAL TIPS FOR GLUCOSE MONITORING  Do not reuse lancets.  Always carry your supplies with you.  All blood glucose meters have a 24-hour "hotline" number to call if you have questions or need help.  Adjust (calibrate) your blood glucose meter with a control solution  after finishing a few boxes of strips. BLOOD GLUCOSE RECORD KEEPING It is a good idea to keep a daily record or log of your blood glucose readings. Most glucose meters, if not all, keep your glucose records stored in the meter. Some meters come with the ability to download your records to your home computer. Keeping a record of your blood glucose readings is especially helpful if you are wanting to look for patterns. Make notes to go along with the blood glucose readings because you might forget what happened at that exact time. Keeping good records helps you and your health care provider to work together to achieve good diabetes management.    This information is not intended to replace advice given to you by your health care provider. Make sure you discuss any questions you have with your health care provider.   Document Released: 05/30/2003 Document Revised: 06/17/2014 Document Reviewed: 10/19/2012 Elsevier Interactive Patient Education [employer].

## 2015-07-11 NOTE — Progress Notes (Signed)
Due to feelings of "fullness" after minimal PO intake, discharge home on hold at this time per Dr. [name].

## 2015-07-11 NOTE — Progress Notes (Signed)
5 Days Post-Op  Subjective: Having flatus and bms now, feels well  Objective: Vital signs in last 24 hours: Temp:  [98.1 F (36.7 C)-99 F (37.2 C)] 98.3 F (36.8 C) (01/31 0541) Pulse Rate:  [75-98] 75 (01/31 0541) Resp:  [18] 18 (01/31 0541) BP: (127-153)/(70-82) 133/70 mmHg (01/31 0541) SpO2:  [96 %-100 %] 96 % (01/31 0541) Last BM Date: 07/11/15  Intake/Output from previous day: 01/30 0701 - 01/31 0700 In: 1080 [P.O.:480; I.V.:600] Out: 2260 [Urine:2000; Drains:260] Intake/Output this shift: Total I/O In: 1080 [P.O.:480; I.V.:600] Out: 1820 [Urine:1600; Drains:220]  GI: soft approp tender wound clean drain serous good bs  Lab Results:   Recent Labs  2015/07/21 0536 07/10/15 0539  WBC 13.2* 9.4  HGB 12.0 11.9*  HCT 37.3 36.6  PLT 325 311   BMET  Recent Labs  07-21-15 0536 07/10/15 0539  NA 133* 132*  K 4.3 4.2  CL 89* 91*  CO2 34* 30  GLUCOSE 187* 163*  BUN 17 11  CREATININE 1.13* 1.18*  CALCIUM 9.2 8.9   PT/INR No results for input(s): LABPROT, INR in the last 72 hours. ABG No results for input(s): PHART, HCO3 in the last 72 hours.  Invalid input(s): PCO2, PO2  Studies/Results: Dg Abd 2 Views  07-21-2015  CLINICAL DATA:  Postoperative nausea and vomiting. Abdominal pain for 3 days. EXAM: ABDOMEN - 2 VIEW COMPARISON:  CT 04/27/2015 FINDINGS: Surgical drain in the mid abdomen. Mild gaseous distention of small bowel loops. No free air. No organomegaly or suspicious calcification. IMPRESSION: Mildly prominent mid abdominal small bowel loops with scattered air-fluid levels. This could. Cannot completely exclude reflect postoperative ileus small bowel obstruction. Electronically Signed   By: Rolm Baptise M.D.   On: Jul 21, 2015 08:54    Anti-infectives: Anti-infectives    Start     Dose/Rate Route Frequency Ordered Stop   07/06/15 0732  cefOXitin (MEFOXIN) 2 g in dextrose 5 % 50 mL IVPB     2 g 100 mL/hr over 30 Minutes Intravenous On call to O.R.  07/06/15 0732 07/06/15 1153      Assessment/Plan: POD 5 sbr/preperitoneal retrorectus phasix repair of incisional hernia  Will keep drain in, staples will come out around day 10 Will setup appt for follow up for both of these with me upon discharge  [facility] 07/11/2015

## 2015-07-11 NOTE — Progress Notes (Signed)
5 Days Post-Op Procedure(s) (LRB): EXPLORATORY LAPAROTOMY (N/A)  LYSIS OF ADHESION RESECTION OF MESENTERIC MASS BOWEL RESECTION  (N/A) INCISIONAL HERNIA REPAIR  (N/A) WITH INSERTION OF PHASIX ST MESH (N/A)  Subjective: + flatus and BM.  Pain well controlled. NGT out yesterday but was only offered ice chips, so hasn't yet challenged gut.  Objective: Vital signs in last 24 hours: Temp:  [98.1 F (36.7 C)-99 F (37.2 C)] 98.3 F (36.8 C) (01/31 0541) Pulse Rate:  [75-98] 75 (01/31 0541) Resp:  [18] 18 (01/31 0541) BP: (127-153)/(70-82) 133/70 mmHg (01/31 0541) SpO2:  [96 %-100 %] 96 % (01/31 0541) Last BM Date: 07/11/15  Intake/Output from previous day: 01/30 0701 - 01/31 0700 In: 1080 [P.O.:480; I.V.:600] Out: 2260 [Urine:2000; Drains:260]  Physical Examination: General: alert, cooperative and no distress Resp: clear to auscultation bilaterally Cardio: regular rate and rhythm, S1, S2 normal, no murmur, click, rub or gallop GI: incision: midline incision with honeycomb dressing intact with no evidence of drainage, binder in place, JP drain stripped, and abdomen obese, soft, non-distended, hypoactive bowel sounds, more bowel sounds in the lower quadrants Extremities: extremities normal, atraumatic, no cyanosis or edema  Imaging:  KUB 07/09/15 revealed dilated mid loops of bowel. Ileus vs obstruction.  Labs:     Today's pending Assessment: 50 y.o. s/p Procedure(s): EXPLORATORY LAPAROTOMY  LYSIS OF ADHESION RESECTION OF MESENTERIC MASS BOWEL RESECTION  INCISIONAL HERNIA REPAIR  WITH INSERTION OF PHASIX ST MESH: stable Pain:  Pain is well-controlled on PRN medications. Will need IV dilaudid only while NPO.   Heme: Hgb stable at 11.9.   CV: BP and HR stable post-operatively.  Continue to monitor with routine vital sign assessments.  Lisinopril, HCTZ, and Metoprolol ordered (but not absorbed at present due to ileus).  GI:   return of bowel function: advance diet today and  if tolerates mechanical soft/regular diet, will allow for discharge.  GU: Adequate output reported.  Voiding. Improved creatinine on labs.  FEN: Hep lock IVF  Endo: Hb A1C reveals DM. Recommend followup with PCP after discharge for long term diabetes management.  Prophylaxis: pharmacologic prophylaxis (with any of the following: enoxaparin (Lovenox) 16m SQ 2 hours prior to surgery then every day) and intermittent pneumatic compression boots.  Plan: Hep lock IVF Reg diet Drain teaching. PO meds. Home today Follow-up with MJoylene Johnin office in 6 days for staple removal. Follow-up with Dr WDonne Hazelas desired by him for drain removal.  Lovenox teaching kit for discharge - for 28 days postop.    LOS: 5 days    RDonaciano Eva1/31/2017, 7:33 AM

## 2015-07-12 LAB — GLUCOSE, CAPILLARY
GLUCOSE-CAPILLARY: 113 mg/dL — AB (ref 65–99)
Glucose-Capillary: 117 mg/dL — ABNORMAL HIGH (ref 65–99)

## 2015-07-12 LAB — MAGNESIUM: MAGNESIUM: 1.7 mg/dL (ref 1.7–2.4)

## 2015-07-12 MED ORDER — INSULIN PEN NEEDLE 31G X 5 MM MISC: Status: DC

## 2015-07-12 MED ORDER — BLOOD GLUCOSE METER KIT: PACK | Status: DC

## 2015-07-12 MED ORDER — INSULIN GLARGINE 100 UNIT/ML SOLOSTAR PEN: 10.0000 [IU] | PEN_INJECTOR | Freq: Every day | SUBCUTANEOUS | Status: DC

## 2015-07-12 MED FILL — ACCU-CHEK FASTCLIX LANCETS: 30 days supply | Qty: 102 | Fill #0

## 2015-07-12 MED FILL — BD PEN NDL MINI 31GX5MM: 31G X 5 MM | 90 days supply | Qty: 100 | Fill #0 | Status: TO

## 2015-07-12 MED FILL — LANTUS SOLOSTAR 100 UNITS/M: 100 | 30 days supply | Qty: 15 | Fill #0

## 2015-07-12 MED FILL — oxyCODONE HCL 5 MG TABS: 5 | 3 days supply | Qty: 30 | Fill #0

## 2015-07-12 MED FILL — ACCU-CHEK NANO SMARTVIEW ME: W/DEVICE | 20 days supply | Qty: 1 | Fill #0

## 2015-07-12 MED FILL — ACCU-CHEK SMARTVIEW STRIP: 12 days supply | Qty: 50 | Fill #0

## 2015-07-12 NOTE — Progress Notes (Signed)
Inpatient Diabetes Program Recommendations  AACE/ADA: New Consensus Statement on Inpatient Glycemic Control (2015)  Target Ranges:  Prepandial:   less than 140 mg/dL      Peak postprandial:   less than 180 mg/dL (1-2 hours)      Critically ill patients:  140 - 180 mg/dL   Review of Glycemic Control  Results for Sarah [name], Sarah [name] (MRN [id number]) as of 07/12/2015 10:18  Ref. Range 07/11/2015 07:44 07/11/2015 11:44 07/11/2015 16:51 07/11/2015 21:47 07/12/2015 08:09  Glucose-Capillary Latest Ref Range: 65-99 mg/dL 123 (H) 130 (H) 127 (H) 139 (H) 113 (H)   Excellent control with Lantus 10 units QD. Will review insulin pen instructions with pt prior to discharge.  Please write prescriptions for the following:  Lantus SoloSTAR pen Insulin Pen Needles 31G x 47m (Order # [id number] Blood Glucose meter kit (includes lancets and strips) - (Order # [id number]  Follow-up with PCP for diabetes management. Please take logbook or glucose meter to PCP appointment for review of CBGs and any needed adjustments.  Thank you. RLorenda Peck RD, LDN, CDE Inpatient Diabetes Coordinator [phone]

## 2015-07-12 NOTE — Progress Notes (Signed)
Patient stating she is "doing good."  Tolerated half a bowl of grits this am and tolerated 3/4 of a piece of fish and 3/4 of a baked potato at lunch.  Reporting mild feelings of fullness, much less than yesterday.  Continuing to pass flatus.  Last loose BM yesterday.  Patient agreeable with being discharged today.  Calling Dr. Cristal Generous office to see when he would like her to follow up in the office.

## 2015-07-12 NOTE — Progress Notes (Signed)
6 Days Post-Op  Subjective: Doing fine, no complaints  Objective: Vital signs in last 24 hours: Temp:  [97.6 F (36.4 C)-98.3 F (36.8 C)] 97.6 F (36.4 C) (02/01 0600) Pulse Rate:  [52-72] 52 (02/01 0600) Resp:  [18] 18 (02/01 0600) BP: (103-136)/(59-83) 127/65 mmHg (02/01 0600) SpO2:  [96 %-98 %] 98 % (02/01 0600) Last BM Date: 07/10/15  Intake/Output from previous day: 01/31 0701 - 02/01 0700 In: 1260 [P.O.:1160; I.V.:100] Out: 399 [Urine:300; Drains:99] Intake/Output this shift:    GI: soft approp tender wound clean drain serous  Lab Results:   Recent Labs  07/10/15 0539  WBC 9.4  HGB 11.9*  HCT 36.6  PLT 311   BMET  Recent Labs  07/10/15 0539  NA 132*  K 4.2  CL 91*  CO2 30  GLUCOSE 163*  BUN 11  CREATININE 1.18*  CALCIUM 8.9   PT/INR No results for input(s): LABPROT, INR in the last 72 hours. ABG No results for input(s): PHART, HCO3 in the last 72 hours.  Invalid input(s): PCO2, PO2  Studies/Results: No results found.  Anti-infectives: Anti-infectives    Start     Dose/Rate Route Frequency Ordered Stop   07/06/15 0732  cefOXitin (MEFOXIN) 2 g in dextrose 5 % 50 mL IVPB     2 g 100 mL/hr over 30 Minutes Intravenous On call to O.R. 07/06/15 0732 07/06/15 1153      Assessment/Plan: POD 6 sbr/preperitoneal retrorectus phasix repair of incisional hernia  Will keep drain in, staples will come out around day 10 Will setup appt for follow up for both of these with me upon discharge  LOS: 6 days    [facility] 07/12/2015

## 2015-07-12 NOTE — Progress Notes (Signed)
Discharge instructions reviewed with patient and questions answered. Prescriptions given to pt. Donne Hazel, RN at [phone]

## 2015-07-12 NOTE — Discharge Summary (Signed)
Physician Discharge Summary  Patient ID: Sarah [name] MRN: [id number] DOB/AGE: 50/17/1967 50 y.o.  Admit date: 07/06/2015 Discharge date: 07/12/2015  Admission Diagnoses: Ovarian cancer [facility])  Discharge Diagnoses:  Principal Problem:   Ovarian cancer ([place]) Active Problems:   Primary peritoneal carcinomatosis ([place])   Hyperglycemia   Ileus following gastrointestinal surgery   Diabetes: new diagnosis, Hgb A1C 10.1  Discharged Condition:  The patient is in good condition and stable for discharge.  She is to follow up with her primary care provider and the diabetes coordinator after discharge about her new diagnosis of diabetes.   Hospital Course: On 07/06/2015, the patient underwent the following: Procedure(s): EXPLORATORY LAPAROTOMY LYSIS OF ADHESION RESECTION OF MESENTERIC MASS BOWEL RESECTION INCISIONAL HERNIA REPAIR WITH INSERTION OF PHASIX ST MESH.  She experienced postoperative delayed return of bowel function: expected due to extensive adhesiolysis and bowel resection.  Abdomen 2 view was performed on 08/06/15.  An NG tube was placed early am on 07/10/15 and removed late morning on 07/10/15 after flatus.  Due to hyperglycemic episodes, sliding scale insulin along with basal insulin was initiated.  Hgb A1C resulted 10.1.  She was discharged to home on postoperative day 6 tolerating a regular diet, passing flatus, voiding without difficulty, with minimal pain.  She is to follow up with Dr. Donne Hazel for drain removal on 07/14/15 and staple removal on 07/17/15.    Consults: Diabetes Coordinator  Significant Diagnostic Studies: Labs, Abdomen 2 View on Aug 06, 2015   Treatments: surgery: see above  Discharge Exam: Blood pressure 127/65, pulse 52, temperature 97.6 F (36.4 C), temperature source Oral, resp. rate 18, height 5' 5"  (1.651 m), weight 226 lb (102.513 kg), SpO2 98 %. General appearance: alert, cooperative and no distress Resp: clear to auscultation bilaterally Cardio: regular rate  and rhythm, S1, S2 normal, no murmur, click, rub or gallop GI: soft, non-tender; bowel sounds normal; no masses,  no organomegaly Extremities: extremities normal, atraumatic, no cyanosis or edema Incision/Wound: Midline incision with staples without erythema or drainage, open to air.  JP drain charged with minimal amount of sanguinous drainage  Disposition: 01-Home or Self Care      Discharge Instructions    Call MD for:  difficulty breathing, headache or visual disturbances    Complete by:  As directed      Call MD for:  difficulty breathing, headache or visual disturbances    Complete by:  As directed      Call MD for:  extreme fatigue    Complete by:  As directed      Call MD for:  extreme fatigue    Complete by:  As directed      Call MD for:  hives    Complete by:  As directed      Call MD for:  hives    Complete by:  As directed      Call MD for:  persistant dizziness or light-headedness    Complete by:  As directed      Call MD for:  persistant dizziness or light-headedness    Complete by:  As directed      Call MD for:  persistant nausea and vomiting    Complete by:  As directed      Call MD for:  persistant nausea and vomiting    Complete by:  As directed      Call MD for:  redness, tenderness, or signs of infection (pain, swelling, redness, odor or green/yellow discharge around incision site)  Complete by:  As directed      Call MD for:  redness, tenderness, or signs of infection (pain, swelling, redness, odor or green/yellow discharge around incision site)    Complete by:  As directed      Call MD for:  severe uncontrolled pain    Complete by:  As directed      Call MD for:  severe uncontrolled pain    Complete by:  As directed      Call MD for:  temperature >100.4    Complete by:  As directed      Call MD for:  temperature >100.4    Complete by:  As directed      Diet - low sodium heart healthy    Complete by:  As directed      Diet - low sodium heart healthy     Complete by:  As directed      Discharge instructions    Complete by:  As directed   Drain care.     Driving Restrictions    Complete by:  As directed   No driving for 1 week.  Do not take narcotics and drive.     Driving Restrictions    Complete by:  As directed   No driving for 1 week.  Do not take narcotics and drive.     Increase activity slowly    Complete by:  As directed      Increase activity slowly    Complete by:  As directed      Lifting restrictions    Complete by:  As directed   No lifting greater than 10 lbs.     Lifting restrictions    Complete by:  As directed   No lifting greater than 10 lbs.            Medication List    TAKE these medications        acetaminophen 500 MG tablet  Commonly known as:  TYLENOL  Take 1,000 mg by mouth every 6 (six) hours as needed for mild pain or headache.     amLODipine 10 MG tablet  Commonly known as:  NORVASC  TAKE 1 TABLET BY MOUTH DAILY     atorvastatin 20 MG tablet  Commonly known as:  LIPITOR  Take 1 tablet (20 mg total) by mouth daily.     blood glucose meter kit and supplies  Dispense based on patient and insurance preference. Use up to four times daily as directed. (FOR ICD-9 250.00, 250.01).     enoxaparin 40 MG/0.4ML injection  Commonly known as:  LOVENOX  Inject 0.4 mLs (40 mg total) into the skin daily.     Insulin Glargine 100 UNIT/ML Solostar Pen  Commonly known as:  LANTUS  Inject 10 Units into the skin daily.     Insulin Pen Needle 31G X 5 MM Misc  For use with lantus pen for injections once daily     latanoprost 0.005 % ophthalmic solution  Commonly known as:  XALATAN  Place 1 drop into both eyes at bedtime.     lisinopril-hydrochlorothiazide 10-12.5 MG tablet  Commonly known as:  PRINZIDE,ZESTORETIC  Take 1 tablet by mouth daily.     metoprolol tartrate 25 MG tablet  Commonly known as:  LOPRESSOR  TAKE 1/2 TABLET BY MOUTH 2 TIMES A DAY     oxyCODONE 5 MG immediate release tablet   Commonly known as:  Oxy IR/ROXICODONE  Take 1-2 tablets (5-10 mg total)  by mouth every 4 (four) hours as needed for severe pain.       Follow-up Information    Follow up with Donaciano Eva, MD On 07/24/2015.   Specialty:  Obstetrics and Gynecology   Why:  at 3:45pm at the [facility] for post-op follow up   Contact information:   [place] [address] [phone]       Follow up with Rolm Bookbinder, MD On 07/14/2015.   Specialty:  General Surgery   Why:  at 2:00pm, arrive at 1:50pm at Dr. Cristal Generous office for drain removal   Contact information:   [address] [place] [address] [phone]       Follow up with Rolm Bookbinder, MD On 07/17/2015.   Specialty:  General Surgery   Why:  at 2:00pm, arrive at 1:50pm at Dr. Cristal Generous office for staple removal   Contact information:   [place] [address] [phone]       Greater than thirty minutes were spend for face to face discharge instructions and discharge orders/summary in EPIC.   Signed: CROSS, MELISSA DEAL 07/12/2015, 2:23 PM

## 2015-07-19 ENCOUNTER — Ambulatory Visit: Payer: Medicaid Other | Admitting: Gynecologic Oncology

## 2015-07-24 ENCOUNTER — Encounter: Payer: Self-pay | Admitting: Gynecologic Oncology

## 2015-07-24 ENCOUNTER — Ambulatory Visit: Payer: Medicaid Other | Attending: Gynecologic Oncology | Admitting: Gynecologic Oncology

## 2015-07-24 VITALS — BP 94/70 | HR 81 | Temp 98.3°F | Resp 18 | Wt 216.2 lb

## 2015-07-24 DIAGNOSIS — Z794 Long term (current) use of insulin: Secondary | ICD-10-CM | POA: Insufficient documentation

## 2015-07-24 DIAGNOSIS — Z08 Encounter for follow-up examination after completed treatment for malignant neoplasm: Secondary | ICD-10-CM | POA: Insufficient documentation

## 2015-07-24 DIAGNOSIS — Z808 Family history of malignant neoplasm of other organs or systems: Secondary | ICD-10-CM | POA: Diagnosis not present

## 2015-07-24 DIAGNOSIS — Z8249 Family history of ischemic heart disease and other diseases of the circulatory system: Secondary | ICD-10-CM | POA: Insufficient documentation

## 2015-07-24 DIAGNOSIS — G629 Polyneuropathy, unspecified: Secondary | ICD-10-CM | POA: Insufficient documentation

## 2015-07-24 DIAGNOSIS — Z8543 Personal history of malignant neoplasm of ovary: Secondary | ICD-10-CM | POA: Diagnosis present

## 2015-07-24 DIAGNOSIS — C569 Malignant neoplasm of unspecified ovary: Secondary | ICD-10-CM

## 2015-07-24 DIAGNOSIS — R351 Nocturia: Secondary | ICD-10-CM | POA: Insufficient documentation

## 2015-07-24 DIAGNOSIS — Z9221 Personal history of antineoplastic chemotherapy: Secondary | ICD-10-CM | POA: Diagnosis present

## 2015-07-24 DIAGNOSIS — K429 Umbilical hernia without obstruction or gangrene: Secondary | ICD-10-CM | POA: Diagnosis not present

## 2015-07-24 DIAGNOSIS — D734 Cyst of spleen: Secondary | ICD-10-CM | POA: Diagnosis not present

## 2015-07-24 DIAGNOSIS — I10 Essential (primary) hypertension: Secondary | ICD-10-CM | POA: Diagnosis not present

## 2015-07-24 DIAGNOSIS — Z8042 Family history of malignant neoplasm of prostate: Secondary | ICD-10-CM | POA: Insufficient documentation

## 2015-07-24 DIAGNOSIS — Z833 Family history of diabetes mellitus: Secondary | ICD-10-CM | POA: Insufficient documentation

## 2015-07-24 DIAGNOSIS — R109 Unspecified abdominal pain: Secondary | ICD-10-CM | POA: Diagnosis not present

## 2015-07-24 DIAGNOSIS — E119 Type 2 diabetes mellitus without complications: Secondary | ICD-10-CM | POA: Diagnosis not present

## 2015-07-24 MED FILL — POLYETHYLENE GLYCOL 3350: 7 days supply | Qty: 255 | Fill #3

## 2015-07-24 NOTE — Progress Notes (Signed)
POST-TREATMENT FOLLOWUP. TREATMENT COUNSELING.  Assessment:    50 y.o. 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   S/p BSO, omentectomy, appendectomy on 07/28/14. S/p adjuvant chemotherapy with 6 cycles of paclitaxel and carboplatin completed on 12/08/14. S/p an ex lap, hernia repair and small bowel resection for a presumed recurrence (benign pathology).   Plan: 1) Abdominal pain: I discussed that this might be from pulling at the site of hernia repair sutures. I discussed that she should follow-up with [name] (planned visit on 07/27/15) to determine if imaging is required. 2) Treatment counseling - Given the benign pathology and otherwise normal peritoneal survey, there is no objective evidence for recurrence. Salvage chemotherapy not indicated. I recommend resuming routine surveillance with visits every 3 months and CA 125 assessments (CT scans on a prn basis for symptoms or rise in CA 125). I will see her back in May. 3) newly diagnosed type II DM- discussed very high HbA1C diagnosed in hospital. She is taking insulin and has planned followup with an internist for ongoing management (07/27/15)  HPI:  Sarah [name] is a 50 y.o. year old initially seen in consultation on 07/08/14 for bilateral ovarian masses and ascites. This was diagnosed at the time of admission to the hospital for hypertensive crisis. This diagnosis of hypertension was new and she was newly started on a medical regimen for this. She then underwent an exploratory laparotomy, BSO, omentectomy, appendectomy on 07/28/14 with Dr Skeet Latch without complications.  Her postoperative course was uncomplicated.  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 This represented a stage IIIc disease process.  She received 6 cycles of adjuvant chemotherapy with paclitaxel and carboplatin with Dr Marko Plume on 08/25/14 to  12/08/14.  Pretreatment CA 125 was 58 on 06/30/14, and was 20 on 12/22/14 (it's nadir had been 16 on 09/22/14).  Post treatment CT of the chest, abdomen and pelvis on 01/12/15 revealed: Previously noted paratracheal, subcarinal and prevascular mediastinal lymphadenopathy has resolved, with no residual pathologically enlarged mediastinal lymph nodes. Resolved hilar lymphadenopathy, with no residual pathologically enlarged mediastinal nodes. The previously described 3 mm subpleural left lower lobe pulmonary nodule is decreased to 2 mm (series 4/image 41). A separate 2 mm subpleural left lower lobe pulmonary nodule (4/29) is slightly decreased from 51m on 06/28/2014. A subpleural 3 mm right upper lobe pulmonary nodule (4/15) is unchanged from 06/28/2014. A 2 mm subpleural pulmonary nodule in the superior segment right lower lobe associated with the right major fissure (4/21) appears new. A 0.7 cm right posterior mesenteric node measures 0.7 cm short axis (series 2/image 93), decreased from 1.2 cm. No definite peritoneal tumor implants detected. A 0.4 cm nodule anterior/inferior to the spleen (series 2/ image 60) is mildly decreased from 0.7 cm. Small umbilical hernia containing a small bowel loop, with no evidence of small-bowel obstruction or strangulation.  She had a CA 125 on 03/27/15 that was normal and stable at 14.  A CT of the chest, abdomen and pelvis was performed on 04/27/15 which showed: No pulmonary mass, infiltrate, or effusion. Tiny less than 5 mm subpleural pulmonary nodules in the right upper lobe on image remain stable. No new or enlarging pulmonary nodules identified. Small left paraventral hernia containing bowel loops. New soft tissue nodularity is seen within the anterior abdominal mesenteric fat, with largest nodule measuring 10 x 17 mm. No other sites of peritoneal nodularity or soft tissue  density seen.   Interval Hx:  On 07/06/15 she was taken to the OR for an ex lap, lysis of  adhesions, and small bowel resection with ventral hernia repair. FIndings were remarkable for substantial small bowel adhesions, hwoever the mass that had been seen on imaging was not appreciated intraperitoneally. There was white nodularity and stranding on the mesentery of a segment of small bowel. It was completely resected with the small bowel resection. Pathology was benign.  Postoperatively she was noted to have very high blood glucose on acuchecks. An HbA1C was drawn on 07/07/15 and was very elevated at 10.1%. She met with a diabetes educator and was started on Insulin.  In the past week she has begun noting increasing upper abdominal/epigstric superficial pain and tightness. Her bowels are working well. She is eating normally.  Current Outpatient Prescriptions on File Prior to Visit  Medication Sig Dispense Refill  . acetaminophen (TYLENOL) 500 MG tablet Take 1,000 mg by mouth every 6 (six) hours as needed for mild pain or headache.    Marland Kitchen amLODipine (NORVASC) 10 MG tablet TAKE 1 TABLET BY MOUTH DAILY 30 tablet 3  . atorvastatin (LIPITOR) 20 MG tablet Take 1 tablet (20 mg total) by mouth daily. 90 tablet 3  . blood glucose meter kit and supplies Dispense based on patient and insurance preference. Use up to four times daily as directed. (FOR ICD-9 250.00, 250.01). 1 each 0  . enoxaparin (LOVENOX) 40 MG/0.4ML injection Inject 0.4 mLs (40 mg total) into the skin daily. 25 Syringe 0  . Insulin Glargine (LANTUS) 100 UNIT/ML Solostar Pen Inject 10 Units into the skin daily. 15 mL 11  . Insulin Pen Needle 31G X 5 MM MISC For use with lantus pen for injections once daily 50 each 10  . latanoprost (XALATAN) 0.005 % ophthalmic solution Place 1 drop into both eyes at bedtime.    Marland Kitchen lisinopril-hydrochlorothiazide (PRINZIDE,ZESTORETIC) 10-12.5 MG per tablet Take 1 tablet by mouth daily. 90 tablet 3  . metoprolol tartrate (LOPRESSOR) 25 MG tablet TAKE 1/2 TABLET BY MOUTH 2 TIMES A DAY (Patient taking  differently: Take 12.5 mg by mouth 2 (two) times daily. ) 30 tablet 2  . oxyCODONE (OXY IR/ROXICODONE) 5 MG immediate release tablet Take 1-2 tablets (5-10 mg total) by mouth every 4 (four) hours as needed for severe pain. 30 tablet 0   No current facility-administered medications on file prior to visit.   Allergies  Allergen Reactions  . Emend [Aprepitant] Other (See Comments)    Urticaria    Past Medical History  Diagnosis Date  . Hypertension   . Glucagonoma 07/28/14    Pt denies this but states she has glaucoma  . Glaucoma 02/16  . Family history of colon cancer   . Wears glasses   . Shortness of breath dyspnea     hx of 2013 - no problems currently   . Thyroid nodule     history of   . Nocturia   . Arthritis   . Peritoneal carcinomatosis ([place])     carcinoma of ovary   . History of chemotherapy   . Imbalance   . Neuropathy ([place])     feet bilat   Past Surgical History  Procedure Laterality Date  . Abdominal hysterectomy    . Thyroidectomy, partial    . Robotic assisted total hysterectomy with bilateral salpingo oopherectomy Bilateral 07/28/2014    Procedure: ROBOTIC ASSISTED lysis of adhesions with biopsies, converted to LAPAROTOMY, bilateral salpingoorphorectomy, omentectomy,appendectomy;  Surgeon: Abigail Butts  Skeet Latch, MD;  Location: [facility];  Service: Gynecology;  Laterality: Bilateral;  . Laparotomy N/A 07/06/2015    Procedure: EXPLORATORY LAPAROTOMY;  Surgeon: Everitt Amber, MD;  Location: [facility];  Service: Gynecology;  Laterality: N/A;  . Lysis of adhesion N/A 07/06/2015    Procedure:  LYSIS OF ADHESION RESECTION OF MESENTERIC MASS BOWEL RESECTION ;  Surgeon: Everitt Amber, MD;  Location: [facility];  Service: Gynecology;  Laterality: N/A;  . Incisional hernia repair N/A 07/06/2015    Procedure: INCISIONAL HERNIA REPAIR ;  Surgeon: Rolm Bookbinder, MD;  Location: [facility];  Service: General;  Laterality: N/A;  . Insertion of mesh N/A 07/06/2015    Procedure: WITH INSERTION OF PHASIX  ST MESH;  Surgeon: Rolm Bookbinder, MD;  Location: [facility];  Service: General;  Laterality: N/A;   Family History  Problem Relation Age of Onset  . Hypertension Mother   . Hypertension Father   . Diabetes Father   . Cancer Sister 50    fibrosarcoma (back); currently 35  . Prostate cancer Maternal Uncle   . Colon cancer Paternal Aunt     Dx 6s; deceased 80s  . Prostate cancer Paternal Uncle     currently 8  . Cancer Paternal Uncle 27    "bone" ; unk. primary  . Stomach cancer Paternal Uncle    Social History   Social History  . Marital Status: Married    Spouse Name: N/A  . Number of Children: N/A  . Years of Education: N/A   Occupational History  . Not on file.   Social History Main Topics  . Smoking status: Never Smoker   . Smokeless tobacco: Never Used  . Alcohol Use: No  . Drug Use: No  . Sexual Activity: Not on file   Other Topics Concern  . Not on file   Social History Narrative    Review of systems: Constitutional:  She has no weight gain or weight loss. She has no fever or chills. Eyes: No blurred vision Ears, Nose, Mouth, Throat: No dizziness, headaches or changes in hearing. No mouth sores. Cardiovascular: No chest pain, palpitations or edema. Respiratory:  No shortness of breath, wheezing or cough Gastrointestinal: She has normal bowel movements without diarrhea or constipation. She denies any nausea or vomiting. She denies blood in her stool or heart burn. Abdominal pain Genitourinary:  She denies pelvic pain, pelvic pressure or changes in her urinary function. She has no hematuria, dysuria, or incontinence. She has no irregular vaginal bleeding or vaginal discharge Musculoskeletal: Denies muscle weakness or joint pains.  Skin:  She has no skin changes, rashes or itching Neurological:  Denies dizziness or headaches. + bilateral foot neuropathy Psychiatric:  She denies depression or anxiety. Hematologic/Lymphatic:   No easy bruising or  bleeding   Physical Exam: Blood pressure 94/70, pulse 81, temperature 98.3 F (36.8 C), temperature source Oral, resp. rate 18, weight 216 lb 3.2 oz (98.068 kg), SpO2 100 %. General: Well dressed, well nourished in no apparent distress.   HEENT:  Normocephalic and atraumatic, no lesions.  Extraocular muscles intact. Sclerae anicteric. Pupils equal, round, reactive. No mouth sores or ulcers. Thyroid is normal size, not nodular, midline. Abdomen:  Soft, nontender, nondistended.  No palpable masses.  No hepatosplenomegaly.  No ascites. Normal bowel sounds.  No hernias.  Incision is healed.No hernia. No erythema. Not tender to palpate. Genitourinary: deferred Extremities: No cyanosis, clubbing or edema.  No calf tenderness or erythema. No palpable cords. Psychiatric: Mood and affect are  appropriate. Neurological: Awake, alert and oriented x 3. Sensation is intact, no neuropathy.  Musculoskeletal: No pain, normal strength and range of motion.   20 minutes of direct face to face counseling time was spent with the patient. This included discussion about prognosis, therapy recommendations and postoperative side effects and are beyond the scope of routine postoperative care.  Donaciano Eva, MD

## 2015-07-27 ENCOUNTER — Ambulatory Visit: Payer: Medicaid Other | Attending: Internal Medicine | Admitting: Internal Medicine

## 2015-07-27 ENCOUNTER — Encounter: Payer: Self-pay | Admitting: Internal Medicine

## 2015-07-27 VITALS — BP 141/89 | HR 93 | Temp 98.2°F | Resp 18 | Ht 65.0 in | Wt 214.0 lb

## 2015-07-27 DIAGNOSIS — C786 Secondary malignant neoplasm of retroperitoneum and peritoneum: Secondary | ICD-10-CM | POA: Insufficient documentation

## 2015-07-27 DIAGNOSIS — C561 Malignant neoplasm of right ovary: Secondary | ICD-10-CM | POA: Diagnosis not present

## 2015-07-27 DIAGNOSIS — Z888 Allergy status to other drugs, medicaments and biological substances status: Secondary | ICD-10-CM | POA: Insufficient documentation

## 2015-07-27 DIAGNOSIS — G5791 Unspecified mononeuropathy of right lower limb: Secondary | ICD-10-CM

## 2015-07-27 DIAGNOSIS — Z794 Long term (current) use of insulin: Secondary | ICD-10-CM | POA: Insufficient documentation

## 2015-07-27 DIAGNOSIS — G5793 Unspecified mononeuropathy of bilateral lower limbs: Secondary | ICD-10-CM | POA: Insufficient documentation

## 2015-07-27 DIAGNOSIS — E785 Hyperlipidemia, unspecified: Secondary | ICD-10-CM | POA: Diagnosis not present

## 2015-07-27 DIAGNOSIS — M1990 Unspecified osteoarthritis, unspecified site: Secondary | ICD-10-CM | POA: Insufficient documentation

## 2015-07-27 DIAGNOSIS — E119 Type 2 diabetes mellitus without complications: Secondary | ICD-10-CM | POA: Insufficient documentation

## 2015-07-27 DIAGNOSIS — I10 Essential (primary) hypertension: Secondary | ICD-10-CM | POA: Diagnosis not present

## 2015-07-27 DIAGNOSIS — Z79899 Other long term (current) drug therapy: Secondary | ICD-10-CM | POA: Insufficient documentation

## 2015-07-27 DIAGNOSIS — E1169 Type 2 diabetes mellitus with other specified complication: Secondary | ICD-10-CM | POA: Insufficient documentation

## 2015-07-27 DIAGNOSIS — G5792 Unspecified mononeuropathy of left lower limb: Secondary | ICD-10-CM

## 2015-07-27 LAB — GLUCOSE, POCT (MANUAL RESULT ENTRY): POC GLUCOSE: 109 mg/dL — AB (ref 70–99)

## 2015-07-27 MED ORDER — GLIPIZIDE 5 MG PO TABS: 5.0000 mg | ORAL_TABLET | Freq: Two times a day (BID) | ORAL | Status: DC

## 2015-07-27 MED ORDER — AMLODIPINE BESYLATE 10 MG PO TABS: 10.0000 mg | ORAL_TABLET | Freq: Every day | ORAL | Status: DC

## 2015-07-27 MED ORDER — GABAPENTIN 100 MG PO CAPS: 100.0000 mg | ORAL_CAPSULE | Freq: Three times a day (TID) | ORAL | Status: DC

## 2015-07-27 MED ORDER — LISINOPRIL-HYDROCHLOROTHIAZIDE 10-12.5 MG PO TABS: 1.0000 | ORAL_TABLET | Freq: Every day | ORAL | Status: DC

## 2015-07-27 MED ORDER — ATORVASTATIN CALCIUM 20 MG PO TABS: 20.0000 mg | ORAL_TABLET | Freq: Every day | ORAL | Status: DC

## 2015-07-27 MED FILL — ATORVASTATIN 20 MG TABLET: 20 | 30 days supply | Qty: 30 | Fill #0

## 2015-07-27 MED FILL — glipiZIDE 5 MG TABS: 5 | 30 days supply | Qty: 60 | Fill #0

## 2015-07-27 MED FILL — GABAPENTIN 100 MG CAPSULE: 100 | 30 days supply | Qty: 90 | Fill #0

## 2015-07-27 MED FILL — LISINOPRIL-HCTZ 10-12.5 MG: 10-12.5 | 30 days supply | Qty: 30 | Fill #0

## 2015-07-27 MED FILL — AMLODIPINE BESYLATE 10 MG T: 10 | 30 days supply | Qty: 30 | Fill #0

## 2015-07-27 NOTE — Patient Instructions (Addendum)
Diabetes and Exercise Exercising regularly is important. It is not just about losing weight. It has many health benefits, such as:  Improving your overall fitness, flexibility, and endurance.  Increasing your bone density.  Helping with weight control.  Decreasing your body fat.  Increasing your muscle strength.  Reducing stress and tension.  Improving your overall health. People with diabetes who exercise gain additional benefits because exercise:  Reduces appetite.  Improves the body's use of blood sugar (glucose).  Helps lower or control blood glucose.  Decreases blood pressure.  Helps control blood lipids (such as cholesterol and triglycerides).  Improves the body's use of the hormone insulin by:  Increasing the body's insulin sensitivity.  Reducing the body's insulin needs.  Decreases the risk for heart disease because exercising:  Lowers cholesterol and triglycerides levels.  Increases the levels of good cholesterol (such as high-density lipoproteins [HDL]) in the body.  Lowers blood glucose levels. YOUR ACTIVITY PLAN  Choose an activity that you enjoy, and set realistic goals. To exercise safely, you should begin practicing any new physical activity slowly, and gradually increase the intensity of the exercise over time. Your health care provider or diabetes educator can help create an activity plan that works for you. General recommendations include:  Encouraging children to engage in at least 60 minutes of physical activity each day.  Stretching and performing strength training exercises, such as yoga or weight lifting, at least 2 times per week.  Performing a total of at least 150 minutes of moderate-intensity exercise each week, such as brisk walking or water aerobics.  Exercising at least 3 days per week, making sure you allow no more than 2 consecutive days to pass without exercising.  Avoiding long periods of inactivity (90 minutes or more). When you  have to spend an extended period of time sitting down, take frequent breaks to walk or stretch. RECOMMENDATIONS FOR EXERCISING WITH TYPE 1 OR TYPE 2 DIABETES   Check your blood glucose before exercising. If blood glucose levels are greater than 240 mg/dL, check for urine ketones. Do not exercise if ketones are present.  Avoid injecting insulin into areas of the body that are going to be exercised. For example, avoid injecting insulin into:  The arms when playing tennis.  The legs when jogging.  Keep a record of:  Food intake before and after you exercise.  Expected peak times of insulin action.  Blood glucose levels before and after you exercise.  The type and amount of exercise you have done.  Review your records with your health care provider. Your health care provider will help you to develop guidelines for adjusting food intake and insulin amounts before and after exercising.  If you take insulin or oral hypoglycemic agents, watch for signs and symptoms of hypoglycemia. They include:  Dizziness.  Shaking.  Sweating.  Chills.  Confusion.  Drink plenty of water while you exercise to prevent dehydration or heat stroke. Body water is lost during exercise and must be replaced.  Talk to your health care provider before starting an exercise program to make sure it is safe for you. Remember, almost any type of activity is better than none.   This information is not intended to replace advice given to you by your health care provider. Make sure you discuss any questions you have with your health care provider.   Document Released: 08/17/2003 Document Revised: 10/11/2014 Document Reviewed: 11/03/2012 Elsevier Interactive Patient Education 2016 Elsevier Inc. Basic Carbohydrate Counting for Diabetes Mellitus Carbohydrate counting   is a method for keeping track of the amount of carbohydrates you eat. Eating carbohydrates naturally increases the level of sugar (glucose) in your  blood, so it is important for you to know the amount that is okay for you to have in every meal. Carbohydrate counting helps keep the level of glucose in your blood within normal limits. The amount of carbohydrates allowed is different for every person. A dietitian can help you calculate the amount that is right for you. Once you know the amount of carbohydrates you can have, you can count the carbohydrates in the foods you want to eat. Carbohydrates are found in the following foods:  Grains, such as breads and cereals.  Dried beans and soy products.  Starchy vegetables, such as potatoes, peas, and corn.  Fruit and fruit juices.  Milk and yogurt.  Sweets and snack foods, such as cake, cookies, candy, chips, soft drinks, and fruit drinks. CARBOHYDRATE COUNTING There are two ways to count the carbohydrates in your food. You can use either of the methods or a combination of both. Reading the "Nutrition Facts" on Packaged Food The "Nutrition Facts" is an area that is included on the labels of almost all packaged food and beverages in the [place]. It includes the serving size of that food or beverage and information about the nutrients in each serving of the food, including the grams (g) of carbohydrate per serving.  Decide the number of servings of this food or beverage that you will be able to eat or drink. Multiply that number of servings by the number of grams of carbohydrate that is listed on the label for that serving. The total will be the amount of carbohydrates you will be having when you eat or drink this food or beverage. Learning Standard Serving Sizes of Food When you eat food that is not packaged or does not include "Nutrition Facts" on the label, you need to measure the servings in order to count the amount of carbohydrates.A serving of most carbohydrate-rich foods contains about 15 g of carbohydrates. The following list includes serving sizes of carbohydrate-rich foods that  provide 15 g ofcarbohydrate per serving:   1 slice of bread (1 oz) or 1 six-inch tortilla.    of a hamburger bun or English muffin.  4-6 crackers.   cup unsweetened dry cereal.    cup hot cereal.   cup rice or pasta.    cup mashed potatoes or  of a large baked potato.  1 cup fresh fruit or one small piece of fruit.    cup canned or frozen fruit or fruit juice.  1 cup milk.   cup plain fat-free yogurt or yogurt sweetened with artificial sweeteners.   cup cooked dried beans or starchy vegetable, such as peas, corn, or potatoes.  Decide the number of standard-size servings that you will eat. Multiply that number of servings by 15 (the grams of carbohydrates in that serving). For example, if you eat 2 cups of strawberries, you will have eaten 2 servings and 30 g of carbohydrates (2 servings x 15 g = 30 g). For foods such as soups and casseroles, in which more than one food is mixed in, you will need to count the carbohydrates in each food that is included. EXAMPLE OF CARBOHYDRATE COUNTING Sample Dinner  3 oz chicken breast.   cup of brown rice.   cup of corn.  1 cup milk.   1 cup strawberries with sugar-free whipped topping.  Carbohydrate Calculation Step   1: Identify the foods that contain carbohydrates:   Rice.   Corn.   Milk.   Strawberries. Step 2:Calculate the number of servings eaten of each:   2 servings of rice.   1 serving of corn.   1 serving of milk.   1 serving of strawberries. Step 3: Multiply each of those number of servings by 15 g:   2 servings of rice x 15 g = 30 g.   1 serving of corn x 15 g = 15 g.   1 serving of milk x 15 g = 15 g.   1 serving of strawberries x 15 g = 15 g. Step 4: Add together all of the amounts to find the total grams of carbohydrates eaten: 30 g + 15 g + 15 g + 15 g = 75 g.   This information is not intended to replace advice given to you by your health care provider. Make sure you  discuss any questions you have with your health care provider.   Document Released: 05/27/2005 Document Revised: 06/17/2014 Document Reviewed: 04/23/2013 Elsevier Interactive Patient Education 2016 Elsevier Inc. Neuropathic Pain Neuropathic pain is pain caused by damage to the nerves that are responsible for certain sensations in your body (sensory nerves). The pain can be caused by damage to:   The sensory nerves that send signals to your spinal cord and brain (peripheral nervous system).  The sensory nerves in your brain or spinal cord (central nervous system). Neuropathic pain can make you more sensitive to pain. What would be a minor sensation for most people may feel very painful if you have neuropathic pain. This is usually a long-term condition that can be difficult to treat. The type of pain can differ from person to person. It may start suddenly (acute), or it may develop slowly and last for a long time (chronic). Neuropathic pain may come and go as damaged nerves heal or may stay at the same level for years. It often causes emotional distress, loss of sleep, and a lower quality of life. CAUSES  The most common cause of damage to a sensory nerve is diabetes. Many other diseases and conditions can also cause neuropathic pain. Causes of neuropathic pain can be classified as:  Toxic. Many drugs and chemicals can cause toxic damage. The most common cause of toxic neuropathic pain is damage from drug treatment for cancer (chemotherapy).  Metabolic. This type of pain can happen when a disease causes imbalances that damage nerves. Diabetes is the most common of these diseases. Vitamin B deficiency caused by long-term alcohol abuse is another common cause.  Traumatic. Any injury that cuts, crushes, or stretches a nerve can cause damage and pain. A common example is feeling pain after losing an arm or leg (phantom limb pain).  Compression-related. If a sensory nerve gets trapped or compressed  for a long period of time, the blood supply to the nerve can be cut off.  Vascular. Many blood vessel diseases can cause neuropathic pain by decreasing blood supply and oxygen to nerves.  Autoimmune. This type of pain results from diseases in which the body's defense system mistakenly attacks sensory nerves. Examples of autoimmune diseases that can cause neuropathic pain include lupus and multiple sclerosis.  Infectious. Many types of viral infections can damage sensory nerves and cause pain. Shingles infection is a common cause of this type of pain.  Inherited. Neuropathic pain can be a symptom of many diseases that are passed down through families (genetic). SIGNS AND  SYMPTOMS  The main symptom is pain. Neuropathic pain is often described as:  Burning.  Shock-like.  Stinging.  Hot or cold.  Itching. DIAGNOSIS  No single test can diagnose neuropathic pain. Your health care provider will do a physical exam and ask you about your pain. You may use a pain scale to describe how bad your pain is. You may also have tests to see if you have a high sensitivity to pain and to help find the cause and location of any sensory nerve damage. These tests may include:  Imaging studies, such as:  X-rays.  CT scan.  MRI.  Nerve conduction studies to test how well nerve signals travel through your sensory nerves (electrodiagnostic testing).  Stimulating your sensory nerves through electrodes on your skin and measuring the response in your spinal cord and brain (somatosensory evoked potentials). TREATMENT  Treatment for neuropathic pain may change over time. You may need to try different treatment options or a combination of treatments. Some options include:  Over-the-counter pain relievers.  Prescription medicines. Some medicines used to treat other conditions may also help neuropathic pain. These include medicines to:  Control seizures (anticonvulsants).  Relieve depression  (antidepressants).  Prescription-strength pain relievers (narcotics). These are usually used when other pain relievers do not help.  Transcutaneous nerve stimulation (TENS). This uses electrical currents to block painful nerve signals. The treatment is painless.  Topical and local anesthetics. These are medicines that numb the nerves. They can be injected as a nerve block or applied to the skin.  Alternative treatments, such as:  Acupuncture.  Meditation.  Massage.  Physical therapy.  Pain management programs.  Counseling. HOME CARE INSTRUCTIONS  Learn as much as you can about your condition.  Take medicines only as directed by your health care provider.  Work closely with all your health care providers to find what works best for you.  Have a good support system at home.  Consider joining a chronic pain support group. SEEK MEDICAL CARE IF:  Your pain treatments are not helping.  You are having side effects from your medicines.  You are struggling with fatigue, mood changes, depression, or anxiety.   This information is not intended to replace advice given to you by your health care provider. Make sure you discuss any questions you have with your health care provider.   Document Released: 02/22/2004 Document Revised: 06/17/2014 Document Reviewed: 11/04/2013 Elsevier Interactive Patient Education [employer].

## 2015-07-27 NOTE — Progress Notes (Signed)
Sarah [name], is a 50 y.o. female  HYW:[id number]  SWN:[id number]  DOB - [birth year]  CC:  Chief Complaint  Patient presents with  . Hospitalization Follow-up    RESULTS + DM       HPI: Sarah [name] is a 50 y.o. female here today for follow up visit from hospitalization for hernia repair surgery. 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 S/p BSO, omentectomy and appendectomy on 07/28/14. S/p adjuvant chemotherapty w/ 6 cycles of paclitaxel and carboplatin completed 12/08/14.  S/p exploratory lap, hernia repair for small bowel resection 07/06/2015. Patient currently on exercise restrictions related to hernia surgery per Dr. Donne Hazel. Patient has recent diagnosis of Diabetes Mellitus T2, A1C on 10.7  (07/07/2015) and was started on Lantus 10 u at bedtime upon hospital discharge. Patient states regular blood sugar checks with readings 130-180. Patient states medication compliance. Patient has No headache, No chest pain, No abdominal pain - No Nausea, No new weakness tingling or numbness, No Cough - SOB.  Allergies  Allergen Reactions  . Emend [Aprepitant] Other (See Comments)    Urticaria    Past Medical History  Diagnosis Date  . Hypertension   . Glucagonoma 07/28/14    Pt denies this but states she has glaucoma  . Glaucoma 02/16  . Family history of colon cancer   . Wears glasses   . Shortness of breath dyspnea     hx of 2013 - no problems currently   . Thyroid nodule     history of   . Nocturia   . Arthritis   . Peritoneal carcinomatosis ([place])     carcinoma of ovary   . History of chemotherapy   . Imbalance   . Neuropathy ([place])     feet bilat   Current Outpatient Prescriptions on File Prior to Visit  Medication Sig Dispense Refill  . ACCU-CHEK SMARTVIEW test strip   0  . acetaminophen (TYLENOL) 500 MG tablet Take 1,000 mg by mouth every 6 (six) hours as needed for mild pain or headache.    . blood glucose meter kit and supplies Dispense  based on patient and insurance preference. Use up to four times daily as directed. (FOR ICD-9 250.00, 250.01). 1 each 0  . enoxaparin (LOVENOX) 40 MG/0.4ML injection Inject 0.4 mLs (40 mg total) into the skin daily. 25 Syringe 0  . Insulin Glargine (LANTUS) 100 UNIT/ML Solostar Pen Inject 10 Units into the skin daily. 15 mL 11  . Insulin Pen Needle 31G X 5 MM MISC For use with lantus pen for injections once daily 50 each 10  . Lancets (ACCU-CHEK MULTICLIX) lancets   0  . latanoprost (XALATAN) 0.005 % ophthalmic solution Place 1 drop into both eyes at bedtime.    . metoprolol tartrate (LOPRESSOR) 25 MG tablet TAKE 1/2 TABLET BY MOUTH 2 TIMES A DAY (Patient taking differently: Take 12.5 mg by mouth 2 (two) times daily. ) 30 tablet 2  . oxyCODONE (OXY IR/ROXICODONE) 5 MG immediate release tablet Take 1-2 tablets (5-10 mg total) by mouth every 4 (four) hours as needed for severe pain. 30 tablet 0   No current facility-administered medications on file prior to visit.   Family History  Problem Relation Age of Onset  . Hypertension Mother   . Hypertension Father   . Diabetes Father   . Cancer Sister 50    fibrosarcoma (back); currently 37  . Prostate cancer Maternal Uncle   . Colon cancer Paternal Aunt  Dx 54s; deceased 34s  . Prostate cancer Paternal Uncle     currently 37  . Cancer Paternal Uncle 59    "bone" ; unk. primary  . Stomach cancer Paternal Uncle    Social History   Social History  . Marital Status: Married    Spouse Name: N/A  . Number of Children: N/A  . Years of Education: N/A   Occupational History  . Not on file.   Social History Main Topics  . Smoking status: Never Smoker   . Smokeless tobacco: Never Used  . Alcohol Use: No  . Drug Use: No  . Sexual Activity: Not on file   Other Topics Concern  . Not on file   Social History Narrative    Review of Systems: Constitutional: Negative for fever, chills, diaphoresis, activity change, appetite change and  fatigue. HENT: Negative for ear pain, nosebleeds, congestion, facial swelling, rhinorrhea, neck pain, neck stiffness and ear discharge.  Eyes: Negative for pain, discharge, redness, itching and visual disturbance. Respiratory: Negative for cough, choking, chest tightness, shortness of breath, wheezing and stridor.  Cardiovascular: Negative for chest pain, palpitations and leg swelling. Gastrointestinal: Negative for abdominal distention. Genitourinary: Negative for dysuria, urgency, frequency, hematuria, flank pain, decreased urine volume, difficulty urinating and dyspareunia.  Musculoskeletal: Negative for back pain, joint swelling, arthralgia and gait problem. Neurological: Negative for dizziness, tremors, seizures, syncope, facial asymmetry, speech difficulty, weakness, light-headedness, numbness and headaches.  Hematological: Negative for adenopathy. Does not bruise/bleed easily. Psychiatric/Behavioral: Negative for hallucinations, behavioral problems, confusion, dysphoric mood, decreased concentration and agitation.    Objective:   Filed Vitals:   07/27/15 1605  BP: 141/89  Pulse: 93  Temp: 98.2 F (36.8 C)  Resp: 18    Physical Exam: Constitutional: Patient appears well-developed and well-nourished. No distress. Neck: Normal ROM. Neck supple. No JVD. No tracheal deviation. No thyromegaly. CVS: RRR, S1/S2 +, no murmurs, no gallops, no carotid bruit.  Pulmonary: Effort and breath sounds normal, no stridor, rhonchi, wheezes, rales.  Abdominal: Soft. BS +, no distension, tenderness, rebound or guarding.  Musculoskeletal: Normal range of motion. No edema and no tenderness.  Lymphadenopathy: No lymphadenopathy noted, Neuro: Alert. And Oriented x 4 Skin: Skin is warm and dry. No rash noted. Not diaphoretic. No erythema. No pallor. Psychiatric: Normal mood and affect. Behavior, judgment, thought content normal.  Lab Results  Component Value Date   WBC 9.4 07/10/2015   HGB 11.9*  07/10/2015   HCT 36.6 07/10/2015   MCV 93.6 07/10/2015   PLT 311 07/10/2015   Lab Results  Component Value Date   CREATININE 1.18* 07/10/2015   BUN 11 07/10/2015   NA 132* 07/10/2015   K 4.2 07/10/2015   CL 91* 07/10/2015   CO2 30 07/10/2015    Lab Results  Component Value Date   HGBA1C 10.1* 07/07/2015   Lipid Panel     Component Value Date/Time   CHOL 247* 07/08/2014 1212   TRIG 151* 07/08/2014 1212   HDL 42 07/08/2014 1212   CHOLHDL 5.9 07/08/2014 1212   VLDL 30 07/08/2014 1212   LDLCALC 175* 07/08/2014 1212       Assessment and plan:   Sarah [name] was seen today for hospitalization follow-up.  Diagnoses and all orders for this visit:  New onset type 2 diabetes mellitus (HCC)  -     Glucose (CBG) -     Microalbumin/Creatinine Ratio, Urine Add -     glipiZIDE (GLUCOTROL) 5 MG tablet; Take 1 tablet (5 mg total)  by mouth 2 (two) times daily before a meal. Continue insulin at the current dose, will check hemoglobin A1c in 3 months and decide if patient needs to continue insulin to be discontinued.  Aim for 30 minutes of exercise most days. Rethink what you drink.  Water is great! Aim for 2-3 Carb Choices per meal (30-45 grams) +/- 1 either way   Aim for 0-15 Carbs per snack if hungry   Include protein in moderation with your meals and snacks   Consider reading food labels for Total Carbohydrate and Fat Grams of foods   Consider checking BG at alternate times per day   Continue taking medication as directed Be mindful about how much sugar you are adding to beverages and other foods.  Fruit Punch - find one with no sugar   Measure and decrease portions of carbohydrate foods   Make your plate and don't go back for seconds  Extensive counseling was completed on diabetes, the disease process and medication possiblities. Patient was given BS log and is to return in 2 weeks with log to meet with RN. Essential hypertension -     amLODipine (NORVASC) 10 MG tablet; Take 1  tablet (10 mg total) by mouth daily. -     lisinopril-hydrochlorothiazide (PRINZIDE,ZESTORETIC) 10-12.5 MG tablet; Take 1 tablet by mouth daily.  We have discussed target BP range and blood pressure goal - I have advised patient to check BP regularly and to call us back or report to clinic if the numbers are consistently higher than 140/90   - We discussed the importance of compliance with medical therapy and DASH diet recommended, consequences of uncontrolled hypertension discussed.   - continue current BP medications  Ovarian cancer, right [facility]): Neuropathic pain from chemotherapy  -     gabapentin (NEURONTIN) 100 MG capsule; Take 1 capsule (100 mg total) by mouth 3 (three) times daily.  Dyslipidemia -     atorvastatin (LIPITOR) 20 MG tablet; Take 1 tablet (20 mg total) by mouth daily.  Neuropathic pain of both legs -     gabapentin (NEURONTIN) 100 MG capsule; Take 1 capsule (100 mg total) by mouth 3 (three) times daily.  Return in about 3 months (around 10/24/2015) for Hemoglobin A1C and Follow up, DM, Follow up HTN, Follow up Pain and comorbidities.  The patient was given clear instructions to go to ER or return to medical center if symptoms don't improve, worsen or new problems develop. The patient verbalized understanding. The patient was told to call to get lab results if they haven't heard anything in the next week.     Clois Dupes, [place] [facility] [phone] 07/27/2015, 5:46 PM   Evaluation and management procedures were performed by the Advanced Practitioner under my supervision and collaboration. I have reviewed the Advanced Practitioner's note and chart, and I agree with the management and plan.   Angelica Chessman, MD, [place], [place], [place], [place] and [place], [place]   07/28/2015, 5:58 PM

## 2015-07-27 NOTE — Progress Notes (Signed)
Patient here for HFU for Ovarian Cancer  Patient complains of pain scaled at a 7 currently at the incision site.  Patient has taken her medications today.

## 2015-07-28 ENCOUNTER — Other Ambulatory Visit: Payer: Self-pay | Admitting: *Deleted

## 2015-07-28 LAB — MICROALBUMIN / CREATININE URINE RATIO
CREATININE, URINE: 174 mg/dL (ref 20–320)
MICROALB UR: 0.7 mg/dL
Microalb Creat Ratio: 4 mcg/mg creat (ref <30)

## 2015-07-28 MED ORDER — ACCU-CHEK SMARTVIEW VI STRP: ORAL_STRIP | Status: DC

## 2015-07-28 NOTE — Telephone Encounter (Signed)
Patient verified DOB Patients request for a refill on her test strips was completed and sent to [facility] pharmacy.

## 2015-08-10 ENCOUNTER — Ambulatory Visit: Payer: Medicaid Other | Admitting: Pharmacist

## 2015-08-14 MED FILL — METOPROLOL TARTRATE 25 MG T: 25 | 30 days supply | Qty: 30 | Fill #1

## 2015-08-15 ENCOUNTER — Encounter: Payer: Medicaid Other | Admitting: Pharmacist

## 2015-08-16 ENCOUNTER — Ambulatory Visit: Payer: Medicaid Other | Attending: Internal Medicine | Admitting: Pharmacist

## 2015-08-16 DIAGNOSIS — E1140 Type 2 diabetes mellitus with diabetic neuropathy, unspecified: Secondary | ICD-10-CM | POA: Diagnosis not present

## 2015-08-16 DIAGNOSIS — E119 Type 2 diabetes mellitus without complications: Secondary | ICD-10-CM | POA: Insufficient documentation

## 2015-08-16 DIAGNOSIS — Z794 Long term (current) use of insulin: Secondary | ICD-10-CM | POA: Diagnosis not present

## 2015-08-16 DIAGNOSIS — R351 Nocturia: Secondary | ICD-10-CM | POA: Insufficient documentation

## 2015-08-16 MED ORDER — INSULIN GLARGINE 100 UNIT/ML SOLOSTAR PEN: 12.0000 [IU] | PEN_INJECTOR | Freq: Every day | SUBCUTANEOUS | Status: DC

## 2015-08-16 NOTE — Patient Instructions (Signed)
Thanks for coming to see me!  Increase your Lantus to 12 units every day  You are doing a good job - keep up the good work!  Come back and see me in 2 weeks unless you have readings lower than 70 - then call the clinic

## 2015-08-16 NOTE — Progress Notes (Signed)
S:    Patient arrives in good spirits.  Presents for diabetes evaluation, education, and management at the request of Dr. Doreene Burke. Patient was referred on 07/27/15.  Patient was last seen by Primary Care Provider on 07/27/15.   Patient reports diabetes is newly diagnosed in 2017.  Patient reports adherence with medications.  Current diabetes medications include: Lantus 10 units daily and glipizide 5 mg BID.  Patient denies hypoglycemic events.  Patient reported dietary habits: patient has not received   Patient reported exercise habits: doesn't exercise.   Patient reports nocturia.  Patient reports neuropathy. Patient denies visual changes. Patient reports self foot exams.    O:  Lab Results  Component Value Date   HGBA1C 10.1* 07/07/2015   There were no vitals filed for this visit.  Home fasting CBG: 83; 130s-160s  2 hour post-prandial/random CBG: 150s-180s  A/P: Diabetes newly diagnosed currently uncontrolled based on A1c of 10.1 but under improved control based on home CBGs. Patient denies hypoglycemic events and is able to verbalize appropriate hypoglycemia management plan. Patient reports adherence with medication. Control is suboptimal due to dietary indiscretion.  Increase Lantus to 12 units daily. Continue glipizide 5 mg BID for now. Patient provided extensive diabetes counseling on what diabetes is, complications of diabetes, A1c, A1c goals, hypoglycemia, blood glucose goals, blood glucose monitoring, diet, and exercise.   Next A1C anticipated April 2017.    Written patient instructions provided.  Total time in face to face counseling 30 minutes.  Follow up in Pharmacist Clinic Visit in 2 weeks.   Patient seen with Jamie Brookes, PharmD Candidate

## 2015-08-17 MED FILL — LANTUS SOLOSTAR 100 UNITS/M: 100 | 25 days supply | Qty: 3 | Fill #0

## 2015-08-24 MED FILL — glipiZIDE 5 MG TABS: 5 | 30 days supply | Qty: 60 | Fill #1

## 2015-08-30 ENCOUNTER — Ambulatory Visit: Payer: Medicaid Other | Attending: Internal Medicine | Admitting: Pharmacist

## 2015-08-30 VITALS — BP 146/91 | HR 92

## 2015-08-30 DIAGNOSIS — Z794 Long term (current) use of insulin: Secondary | ICD-10-CM | POA: Insufficient documentation

## 2015-08-30 DIAGNOSIS — G629 Polyneuropathy, unspecified: Secondary | ICD-10-CM | POA: Insufficient documentation

## 2015-08-30 DIAGNOSIS — R351 Nocturia: Secondary | ICD-10-CM | POA: Insufficient documentation

## 2015-08-30 DIAGNOSIS — Z79899 Other long term (current) drug therapy: Secondary | ICD-10-CM | POA: Diagnosis not present

## 2015-08-30 DIAGNOSIS — E119 Type 2 diabetes mellitus without complications: Secondary | ICD-10-CM | POA: Insufficient documentation

## 2015-08-30 MED ORDER — METFORMIN HCL ER 500 MG PO TB24: 1000.0000 mg | ORAL_TABLET | Freq: Every day | ORAL | Status: DC

## 2015-08-30 MED FILL — METFORMIN HCL ER 500 MG TAB: 500 | 30 days supply | Qty: 60 | Fill #0

## 2015-08-30 NOTE — Progress Notes (Signed)
S:    Patient arrives in good spirits.  Presents for diabetes evaluation, education, and management at the request of Dr. Doreene Burke. Patient was referred on 07/27/15.  Patient was last seen by Primary Care Provider on 07/27/15.   Patient reports diabetes is newly diagnosed in 2017.  Patient reports adherence with medications.  Current diabetes medications include: Lantus 12 units daily and glipizide 5 mg BID.  Patient denies hypoglycemic events.  Patient reported dietary habits: patient is working on diet and trying to control carb intake. She understands foods that will make her blood sugar go up.   Patient reported exercise habits: walking   Patient reports nocturia.  Patient reports neuropathy. Patient denies visual changes. Patient reports self foot exams.   Overall she is feeling more like herself.   O:  Lab Results  Component Value Date   HGBA1C 10.1* 07/07/2015   Filed Vitals:   08/30/15 0942  BP: 146/91  Pulse: 92    Home fasting CBG: 83; 100s-140s  2 hour post-prandial/random CBG: 150s-200s  A/P: Diabetes newly diagnosed currently uncontrolled based on A1c of 10.1 but under improved control based on home CBGs. Patient denies hypoglycemic events and is able to verbalize appropriate hypoglycemia management plan. Patient reports adherence with medication. Control is suboptimal due to dietary indiscretion and she knows what foods will elevate her blood glucose.  Initiate metformin XL 500 mg daily and then increase to 1000 mg daily if tolerated after 1 week. Patient counseled on how to use, the benefit of metformin, and possible adverse effects, including GI upset. Will monitor BMET regularly for renal function. Continue Lantus to 12 units daily. Continue glipizide 5 mg BID for now. Patient to continue to monitor blood glucose at home and will call us with any hypoglycemia. Patient verbalized understanding.   Next A1C anticipated April 2017.    Written patient instructions  provided.  Total time in face to face counseling 30 minutes.  Follow up in Pharmacist Clinic Visit in 2 weeks.   Patient seen with Jamie Brookes, PharmD Candidate

## 2015-08-30 NOTE — Patient Instructions (Signed)
Thank you for coming in to see Sarah [name] today!  Start Metformin 500 mg once daily. If you're doing well with it, increase to 1000 mg (2 tablets) daily.  Keep up the good work with your diet and try to eat at home where you can control your diet as much as you can. Starting to walk more is going to be very beneficial for your health. Make sure to take it slow and build up your endurance over time!  Please come see Sarah [name] in 2 weeks so we can see how you're doing with your new medication!

## 2015-08-31 ENCOUNTER — Encounter: Payer: Self-pay | Admitting: Clinical

## 2015-08-31 NOTE — Progress Notes (Signed)
Depression screen [facility] 2/9 07/27/2015 11/03/2014 07/08/2014  Decreased Interest 0 0 0  Down, Depressed, Hopeless 0 0 0  PHQ - 2 Score 0 0 0  PHQ9: 1(trouble falling or staying asleep, or sleeping too much)

## 2015-09-11 MED FILL — METOPROLOL TARTRATE 25 MG T: 25 | 30 days supply | Qty: 30 | Fill #2

## 2015-09-11 MED FILL — AMLODIPINE BESYLATE 10 MG T: 10 | 30 days supply | Qty: 30 | Fill #1

## 2015-09-11 MED FILL — ATORVASTATIN 20 MG TABLET: 20 | 30 days supply | Qty: 30 | Fill #1

## 2015-09-11 MED FILL — LISINOPRIL-HCTZ 10-12.5 MG: 10-12.5 | 30 days supply | Qty: 30 | Fill #1

## 2015-09-13 ENCOUNTER — Encounter: Payer: Self-pay | Admitting: Pharmacist

## 2015-09-13 ENCOUNTER — Ambulatory Visit: Payer: Medicaid Other | Attending: Internal Medicine | Admitting: Pharmacist

## 2015-09-13 DIAGNOSIS — E119 Type 2 diabetes mellitus without complications: Secondary | ICD-10-CM | POA: Diagnosis not present

## 2015-09-13 NOTE — Patient Instructions (Signed)
Thanks for coming to see Sarah [name]!  You are doing AWESOME!!! Keep up the great work!!!  Increase the metformin to 1000 mg 2 times a day  If you get readings less than 70, let Sarah [name] know.  Otherwise, follow up with Dr. Doreene Burke in 4 weeks

## 2015-09-13 NOTE — Progress Notes (Signed)
S:    Patient arrives in good spirits.  Presents for diabetes evaluation, education, and management at the request of Dr. Doreene Burke. Patient was referred on 07/27/15.  Patient was last seen by Primary Care Provider on 07/27/15.   Patient reports diabetes is newly diagnosed in 2017.  Patient reports adherence with medications.  Current diabetes medications include: Lantus 12 units daily and glipizide 5 mg BID and metformin XL 1000 mg daily. She has tolerated the metformin well and the GI upset she experienced in the first few days has resolved.  Patient denies hypoglycemic events.  Patient reported dietary habits: patient is working on diet and trying to control carb intake. She understands foods that will make her blood sugar go up.   Patient reported exercise habits: walking   Patient reports nocturia.  Patient reports neuropathy. Patient denies visual changes. Patient reports self foot exams.   Patient is very happy with her health right now and the progress she has made on her CBGs.   O:  Lab Results  Component Value Date   HGBA1C 10.1* 07/07/2015   There were no vitals filed for this visit.  Home fasting CBG: 90s-140s  2 hour post-prandial/random CBG: 150s-180s  A/P: Diabetes newly diagnosed currently uncontrolled based on A1c of 10.1 but under improved control based on home CBGs. Patient denies hypoglycemic events and is able to verbalize appropriate hypoglycemia management plan. Patient reports adherence with medication.   Increased metformin to 1000 mg XL BID. Continue glipizide 5 mg BID and Lantus 12 units daily. I do not expect patient to become hypoglycemic from metformin dose titration. Patient will let us know if she has any readings <70.   Next A1C anticipated April 2017 - I expect to be much improved.  Written patient instructions provided.  Total time in face to face counseling 30 minutes.  Follow up in Pharmacist Clinic Visit PRN, next visit with Dr. Doreene Burke.    Patient seen with Jamie Brookes, PharmD Candidate

## 2015-09-25 MED FILL — glipiZIDE 5 MG TABS: 5 | 30 days supply | Qty: 60 | Fill #2

## 2015-09-25 MED FILL — METFORMIN HCL ER 500 MG TAB: 500 | 30 days supply | Qty: 60 | Fill #1

## 2015-09-29 ENCOUNTER — Other Ambulatory Visit: Payer: Self-pay

## 2015-09-29 DIAGNOSIS — C569 Malignant neoplasm of unspecified ovary: Secondary | ICD-10-CM

## 2015-10-01 ENCOUNTER — Other Ambulatory Visit: Payer: Self-pay | Admitting: Oncology

## 2015-10-02 ENCOUNTER — Ambulatory Visit (HOSPITAL_BASED_OUTPATIENT_CLINIC_OR_DEPARTMENT_OTHER): Payer: Medicaid Other | Admitting: Oncology

## 2015-10-02 ENCOUNTER — Other Ambulatory Visit (HOSPITAL_BASED_OUTPATIENT_CLINIC_OR_DEPARTMENT_OTHER): Payer: Medicaid Other

## 2015-10-02 ENCOUNTER — Encounter: Payer: Self-pay | Admitting: Oncology

## 2015-10-02 VITALS — BP 126/79 | HR 77 | Temp 98.1°F | Resp 18 | Wt 216.2 lb

## 2015-10-02 DIAGNOSIS — C482 Malignant neoplasm of peritoneum, unspecified: Secondary | ICD-10-CM | POA: Diagnosis present

## 2015-10-02 DIAGNOSIS — C569 Malignant neoplasm of unspecified ovary: Secondary | ICD-10-CM | POA: Diagnosis present

## 2015-10-02 DIAGNOSIS — E119 Type 2 diabetes mellitus without complications: Secondary | ICD-10-CM

## 2015-10-02 DIAGNOSIS — G620 Drug-induced polyneuropathy: Secondary | ICD-10-CM

## 2015-10-02 DIAGNOSIS — T451X5A Adverse effect of antineoplastic and immunosuppressive drugs, initial encounter: Secondary | ICD-10-CM

## 2015-10-02 DIAGNOSIS — Z1239 Encounter for other screening for malignant neoplasm of breast: Secondary | ICD-10-CM

## 2015-10-02 DIAGNOSIS — E669 Obesity, unspecified: Secondary | ICD-10-CM

## 2015-10-02 DIAGNOSIS — Z794 Long term (current) use of insulin: Secondary | ICD-10-CM

## 2015-10-02 LAB — COMPREHENSIVE METABOLIC PANEL
ALT: 11 U/L (ref 0–55)
AST: 19 U/L (ref 5–34)
Albumin: 4 g/dL (ref 3.5–5.0)
Alkaline Phosphatase: 69 U/L (ref 40–150)
Anion Gap: 12 mEq/L — ABNORMAL HIGH (ref 3–11)
BUN: 19.9 mg/dL (ref 7.0–26.0)
CO2: 27 mEq/L (ref 22–29)
Calcium: 10.3 mg/dL (ref 8.4–10.4)
Chloride: 102 mEq/L (ref 98–109)
Creatinine: 1.2 mg/dL — ABNORMAL HIGH (ref 0.6–1.1)
EGFR: 62 mL/min/{1.73_m2} — ABNORMAL LOW (ref >90)
Glucose: 67 mg/dl — ABNORMAL LOW (ref 70–140)
Potassium: 3.8 mEq/L (ref 3.5–5.1)
Sodium: 141 mEq/L (ref 136–145)
Total Bilirubin: 0.61 mg/dL (ref 0.20–1.20)
Total Protein: 8.7 g/dL — ABNORMAL HIGH (ref 6.4–8.3)

## 2015-10-02 LAB — CBC WITH DIFFERENTIAL/PLATELET
BASO%: 0.3 % (ref 0.0–2.0)
Basophils Absolute: 0 10*3/uL (ref 0.0–0.1)
EOS%: 0.5 % (ref 0.0–7.0)
Eosinophils Absolute: 0 10*3/uL (ref 0.0–0.5)
HCT: 39.6 % (ref 34.8–46.6)
HEMOGLOBIN: 12.9 g/dL (ref 11.6–15.9)
LYMPH%: 36.7 % (ref 14.0–49.7)
MCH: 29.5 pg (ref 25.1–34.0)
MCHC: 32.5 g/dL (ref 31.5–36.0)
MCV: 90.6 fL (ref 79.5–101.0)
MONO#: 0.9 10*3/uL (ref 0.1–0.9)
MONO%: 10.3 % (ref 0.0–14.0)
NEUT%: 52.2 % (ref 38.4–76.8)
NEUTROS ABS: 4.5 10*3/uL (ref 1.5–6.5)
Platelets: 355 10*3/uL (ref 145–400)
RBC: 4.38 10*6/uL (ref 3.70–5.45)
RDW: 15.2 % — AB (ref 11.2–14.5)
WBC: 8.6 10*3/uL (ref 3.9–10.3)
lymph#: 3.2 10*3/uL (ref 0.9–3.3)

## 2015-10-02 NOTE — Progress Notes (Signed)
OFFICE PROGRESS NOTE   October 04, 2015   Physicians: Terrence Dupont Rossi/ W. 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 She completed 6 cycles of adjuvant carbo taxol on 12-08-14. Tiny pulmonary nodules seen on intial imaging were stable by restaging scans 04-2015 and are not thought malignant. She had no pathologic evidence of disease at exploratory laparotomy by Drs [name] and [place] 07-06-15.  She is to see Dr [name] again in May She has prn follow up with [name].  Patient was hospitalized 1-26 thru 07-12-15 for exploratory laparotomy with lysis of adhesions and partial small bowel resection + mesh repair of ventral abdominal hernia. Pathology [phone] negative for malignancy. She has been diagnosed with diabetes, which runs in family, now on oral agent + insulin and checks blood sugars 3-4x daily. Dr Doreene Burke manages the DM, with pharmacist also involved. Blood sugars have been 60- 70 by evening meal and hs recently, and I have asked her to let Dr Christa See pharmacist know. She seems minimally symptomatic with those low readings.  NOTE blood sugar NOT fasting 67 here now.   Patient denies abdominal or pelvic discomfort, SOB, bleeding, fever or symptoms of infection. 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  Remainder of 10 point Review of Systems negative.   No PAC Genetics testing normal by [employer] panel 09-15-14 Pre-op CA 125 (06-30-14) 58 Flu vaccine 03-27-15 Mammograms 04-10-15 negative at [place]  Patient presented to ED 06-28-14 with fairly acute onset of low back pain, without known trauma, so severe at that time that she was having difficulty standing and  walking; blood pressure in ED was 260/148 such that she was admitted for hypertensive urgency. Evaluation included CT CAP 06-28-14 which demonstrated a 4.4 cm right adnexal mass, with stranding in omentum and small fluid in pelvis, as well as mild mediastinal and hilar adenopathy. US showed 4.6 x 5.2 x 3.8 cm apparent complex ovarian mass. CA 125 was 58 on 06-30-14. She was seen in consultation by Dr [name] on 07-08-14. Imaging otherwise showed no aortic aneurysm; MRI of L spine 06-29-14 found some facet hypertrophy and small right paracentral disc protrusion L5S1. Blood pressure was treated and patient established with [facility], Dr Doreene Burke.  She was stable for surgery by Dr Skeet Latch 07-28-14, which was exploratory laparotomy with BSO, infragastric omentectomy, washings and biopsies and appendectomy. Pathology [phone]) found low grade serous carcinoma thruout. Post operative course was unremarkable. She saw Dr [name] on 08-03-14, with recommendation for 6 cycles of taxol and carboplatin then consideration of Megace maintenance; she will see Dr [name] again after chemo completes. Cycle 1 carbo taxol was 08-25-14, with leukopenia by day 8 such that granix was added x 3 doses. Delayed nausea was a problem with initial cycles, EMEND approved for cycle 3, helpful with nausea but likely allergic reaction to this cycle 4. Cycle 6 completed on 12-08-14.   Objective:  Vital signs in last 24 hours:  BP 126/79 mmHg  Pulse 77  Temp(Src) 98.1 F (36.7 C) (Oral)  Resp 18  Wt 216 lb 3.2 oz (98.068 kg)  SpO2 100% Weight down 4 lbs. Alert, oriented and appropriate. Ambulatory without difficulty.  No alopecia  HEENT:PERRL, sclerae not icteric. Oral mucosa moist without lesions, posterior pharynx clear.  Neck supple. No JVD.  Lymphatics:no supraclavicular or inguinal adenopathy Resp: clear to auscultation bilaterally and normal percussion bilaterally Cardio: regular rate and rhythm. No  gallop. GI: soft, nontender, not distended, no mass or organomegaly. Normally active bowel sounds. Surgical incision appears well healed, no apparent hernia now. Musculoskeletal/ Extremities: without pitting edema, cords, tenderness Neuro: no peripheral neuropathy. Otherwise nonfocal Skin without rash, ecchymosis, petechiae   Lab Results:  Results for orders placed or performed in visit on 10/02/15  CBC with Differential  Result Value Ref Range   WBC 8.6 3.9 - 10.3 10e3/uL   NEUT# 4.5 1.5 - 6.5 10e3/uL   HGB 12.9 11.6 - 15.9 g/dL   HCT 39.6 34.8 - 46.6 %   Platelets 355 145 - 400 10e3/uL   MCV 90.6 79.5 - 101.0 fL   MCH 29.5 25.1 - 34.0 pg   MCHC 32.5 31.5 - 36.0 g/dL   RBC 4.38 3.70 - 5.45 10e6/uL   RDW 15.2 (H) 11.2 - 14.5 %   lymph# 3.2 0.9 - 3.3 10e3/uL   MONO# 0.9 0.1 - 0.9 10e3/uL   Eosinophils Absolute 0.0 0.0 - 0.5 10e3/uL   Basophils Absolute 0.0 0.0 - 0.1 10e3/uL   NEUT% 52.2 38.4 - 76.8 %   LYMPH% 36.7 14.0 - 49.7 %   MONO% 10.3 0.0 - 14.0 %   EOS% 0.5 0.0 - 7.0 %   BASO% 0.3 0.0 - 2.0 %  Comprehensive metabolic panel  Result Value Ref Range   Sodium 141 136 - 145 mEq/L   Potassium 3.8 3.5 - 5.1 mEq/L   Chloride 102 98 - 109 mEq/L   CO2 27 22 - 29 mEq/L   Glucose 67 (L) 70 - 140 mg/dl   BUN 19.9 7.0 - 26.0 mg/dL   Creatinine 1.2 (H) 0.6 - 1.1 mg/dL   Total Bilirubin 0.61 0.20 - 1.20 mg/dL   Alkaline Phosphatase 69 40 - 150 U/L   AST 19 5 - 34 U/L   ALT 11 0 - 55 U/L   Total Protein 8.7 (H) 6.4 - 8.3 g/dL   Albumin 4.0 3.5 - 5.0 g/dL   Calcium 10.3 8.4 - 10.4 mg/dL   Anion Gap 12 (H) 3 - 11 mEq/L   EGFR 62 (L) >90 ml/min/1.73 m2  CA 125  Result Value Ref Range   Cancer Antigen (CA) 125 17.3 0.0 - 38.1 U/mL  CA 125 (Parallel Testing)  Result Value Ref Range   CA 125 11 <35 U/mL     Studies/Results:  FINAL for HYDEE, FLEECE ([id number]) Patient: ORTHA, METTS Collected: 07/06/2015 Client: [facility] Accession: [id number] Received:  07/06/2015 Everitt Amber, MD REPORT OF SURGICAL PATHOLOGY FINAL DIAGNOSIS Diagnosis 1. Soft tissue, biopsy ENDOSALPINGIOSIS WITH SURROUNDING FIBROADIPOSE TISSUE 2. Small intestine, resection for tumor SMALL INTESTINE WITH SEROSAL ADHESION, EDEMA AND REACTIVE CHANGES NEGATIVE FOR MALIGNANCY   Medications: I have reviewed the patient's current medications.  DISCUSSION Interval history and reassuring pathology + CT chest information all reviewed with patient. She will continue observation with gyn oncology and medical oncology  She should be in touch with Dr Doreene Burke or that pharmacist prior to scheduled visit next month, to let them know about lower blood sugars.   Assessment/Plan:  1. 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 NED by scans, marker, exam and surgical pathology 06-2015. She will see Dr [name] in May and I will  see her with labs August. 2.recently diagnosed diabetes: on metformin an glipizide + insulin, followed by PCP. Blood sugars may be lower than optimal, will cc this note + nonfasting chemistries today to Dr Doreene Burke 3.Hypertensive urgency at presentation, BP much better, followed by PCP with [facility] 4.mammograms done 04-10-15, ok and breast tissue not dense, needs annual this fall. 5.family history cancers, genetics testing negative including germ line BRCA 6.intermittent low back pain: does not seem related to the cancer diagnosis degenerative change seen on MRI L spine.  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 Seems a little better 10. Post partial thyroidectomy  11. DIfficult IV access  12.allergy to EMEND 13.Obesity: BMI 36. Getting more exercise at home and watching diet 14.flu vaccine done 15. chemo and iron deficiency anemia previously: hemoglobin now in normal range  All questions answered. Time  spent 20 min including >50% counseling and coordination of care. Route PCP, cc Dr Windle Guard, MD   10/04/2015, 2:05 PM

## 2015-10-03 LAB — CA 125: Cancer Antigen (CA) 125: 17.3 U/mL (ref 0.0–38.1)

## 2015-10-03 LAB — CANCER ANTIGEN 125 (PARALLEL TESTING): CA 125: 11 U/mL (ref <35)

## 2015-10-04 ENCOUNTER — Telehealth: Payer: Self-pay | Admitting: Oncology

## 2015-10-04 DIAGNOSIS — E669 Obesity, unspecified: Secondary | ICD-10-CM | POA: Insufficient documentation

## 2015-10-04 DIAGNOSIS — E119 Type 2 diabetes mellitus without complications: Secondary | ICD-10-CM | POA: Insufficient documentation

## 2015-10-04 NOTE — Telephone Encounter (Signed)
spoke w/ pt confirmed aug appt

## 2015-10-09 ENCOUNTER — Other Ambulatory Visit: Payer: Self-pay | Admitting: Internal Medicine

## 2015-10-09 MED FILL — LISINOPRIL-HCTZ 10-12.5 MG: 10-12.5 | 30 days supply | Qty: 30 | Fill #2

## 2015-10-09 MED FILL — ATORVASTATIN 20 MG TABLET: 20 | 30 days supply | Qty: 30 | Fill #2

## 2015-10-09 MED FILL — METOPROLOL TARTRATE 25 MG T: 25 | 30 days supply | Qty: 30 | Fill #0

## 2015-10-09 MED FILL — AMLODIPINE BESYLATE 10 MG T: 10 | 30 days supply | Qty: 30 | Fill #2

## 2015-10-11 ENCOUNTER — Telehealth: Payer: Self-pay | Admitting: Internal Medicine

## 2015-10-11 ENCOUNTER — Other Ambulatory Visit: Payer: Self-pay | Admitting: Pharmacist

## 2015-10-11 MED ORDER — METFORMIN HCL ER 500 MG PO TB24: 1000.0000 mg | ORAL_TABLET | Freq: Two times a day (BID) | ORAL | Status: DC

## 2015-10-11 NOTE — Telephone Encounter (Signed)
Patient states that Pharmacist upped her dosage...  New prescription with correct dosage needs to be sent to pharmacy.  Patient is out of medication  Metformin.Marland Kitchen  Please follow up with patient

## 2015-10-16 MED FILL — METFORMIN HCL ER 500 MG TAB: 500 | 30 days supply | Qty: 120 | Fill #0

## 2015-10-23 ENCOUNTER — Ambulatory Visit: Payer: Medicaid Other | Attending: Gynecologic Oncology | Admitting: Gynecologic Oncology

## 2015-10-23 ENCOUNTER — Encounter: Payer: Self-pay | Admitting: Gynecologic Oncology

## 2015-10-23 VITALS — BP 127/76 | HR 67 | Temp 98.2°F | Resp 18 | Ht 65.0 in | Wt 217.0 lb

## 2015-10-23 DIAGNOSIS — Z800 Family history of malignant neoplasm of digestive organs: Secondary | ICD-10-CM | POA: Diagnosis not present

## 2015-10-23 DIAGNOSIS — Z9221 Personal history of antineoplastic chemotherapy: Secondary | ICD-10-CM

## 2015-10-23 DIAGNOSIS — M1990 Unspecified osteoarthritis, unspecified site: Secondary | ICD-10-CM | POA: Diagnosis not present

## 2015-10-23 DIAGNOSIS — Z9889 Other specified postprocedural states: Secondary | ICD-10-CM | POA: Diagnosis not present

## 2015-10-23 DIAGNOSIS — C786 Secondary malignant neoplasm of retroperitoneum and peritoneum: Secondary | ICD-10-CM | POA: Diagnosis not present

## 2015-10-23 DIAGNOSIS — Z794 Long term (current) use of insulin: Secondary | ICD-10-CM | POA: Diagnosis not present

## 2015-10-23 DIAGNOSIS — E119 Type 2 diabetes mellitus without complications: Secondary | ICD-10-CM | POA: Diagnosis not present

## 2015-10-23 DIAGNOSIS — H409 Unspecified glaucoma: Secondary | ICD-10-CM | POA: Diagnosis not present

## 2015-10-23 DIAGNOSIS — C569 Malignant neoplasm of unspecified ovary: Secondary | ICD-10-CM | POA: Insufficient documentation

## 2015-10-23 DIAGNOSIS — I10 Essential (primary) hypertension: Secondary | ICD-10-CM | POA: Diagnosis not present

## 2015-10-23 DIAGNOSIS — R0602 Shortness of breath: Secondary | ICD-10-CM | POA: Diagnosis not present

## 2015-10-23 DIAGNOSIS — Z79899 Other long term (current) drug therapy: Secondary | ICD-10-CM | POA: Insufficient documentation

## 2015-10-23 DIAGNOSIS — Z8543 Personal history of malignant neoplasm of ovary: Secondary | ICD-10-CM | POA: Diagnosis not present

## 2015-10-23 DIAGNOSIS — Z90722 Acquired absence of ovaries, bilateral: Secondary | ICD-10-CM | POA: Diagnosis not present

## 2015-10-23 DIAGNOSIS — Z888 Allergy status to other drugs, medicaments and biological substances status: Secondary | ICD-10-CM | POA: Insufficient documentation

## 2015-10-23 NOTE — Progress Notes (Signed)
POST-TREATMENT FOLLOWUP. TREATMENT COUNSELING.  Assessment:   50 y.o. 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   S/p BSO, omentectomy, appendectomy on 07/28/14. S/p adjuvant chemotherapy with 6 cycles of paclitaxel and carboplatin completed on 12/08/14. S/p an ex lap, hernia repair and small bowel resection for a presumed recurrence (benign pathology).   No evidence of recurrent disease on exam.  Plan: 1) Abdominal pain: I discussed that this might be from pulling at the site of hernia repair sutures. This is improving. 2) Treatment counseling -  I recommend continuing routine surveillance with visits every 3 months and CA 125 assessments (CT scans on a prn basis for symptoms or rise in CA 125). I will see her back in August. 3) newly diagnosed type II DM- patient is on active therapy with insulin and oral medical therapy. 4) persistent back pain - no evidence of ovarian cancer as source of this. Recommend she follow-up with her PCP for further work-up.  HPI:  Sarah [name] is a 50 y.o. year old initially seen in consultation on 07/08/14 for bilateral ovarian masses and ascites. This was diagnosed at the time of admission to the hospital for hypertensive crisis. This diagnosis of hypertension was new and she was newly started on a medical regimen for this. She then underwent an exploratory laparotomy, BSO, omentectomy, appendectomy on 07/28/14 with Dr Skeet Latch without complications.  Her postoperative course was uncomplicated.  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 This represented a stage IIIc disease process.  She received 6 cycles of adjuvant chemotherapy with paclitaxel and carboplatin with Dr Marko Plume on 08/25/14 to 12/08/14.  Pretreatment CA 125 was 58 on 06/30/14, and was 20 on 12/22/14 (it's nadir had been 16 on 09/22/14).  Post treatment CT of the chest,  abdomen and pelvis on 01/12/15 revealed: Previously noted paratracheal, subcarinal and prevascular mediastinal lymphadenopathy has resolved, with no residual pathologically enlarged mediastinal lymph nodes. Resolved hilar lymphadenopathy, with no residual pathologically enlarged mediastinal nodes. The previously described 3 mm subpleural left lower lobe pulmonary nodule is decreased to 2 mm (series 4/image 41). A separate 2 mm subpleural left lower lobe pulmonary nodule (4/29) is slightly decreased from 29m on 06/28/2014. A subpleural 3 mm right upper lobe pulmonary nodule (4/15) is unchanged from 06/28/2014. A 2 mm subpleural pulmonary nodule in the superior segment right lower lobe associated with the right major fissure (4/21) appears new. A 0.7 cm right posterior mesenteric node measures 0.7 cm short axis (series 2/image 93), decreased from 1.2 cm. No definite peritoneal tumor implants detected. A 0.4 cm nodule anterior/inferior to the spleen (series 2/ image 60) is mildly decreased from 0.7 cm. Small umbilical hernia containing a small bowel loop, with no evidence of small-bowel obstruction or strangulation.  She had a CA 125 on 03/27/15 that was normal and stable at 14.  A CT of the chest, abdomen and pelvis was performed on 04/27/15 which showed: No pulmonary mass, infiltrate, or effusion. Tiny less than 5 mm subpleural pulmonary nodules in the right upper lobe on image remain stable. No new or enlarging pulmonary nodules identified. Small left paraventral hernia containing bowel loops. New soft tissue nodularity is seen within the anterior abdominal mesenteric fat, with largest nodule measuring 10 x 17 mm. No other sites of peritoneal nodularity or soft tissue density seen.   On 07/06/15 she was taken to the OR for an ex lap,  lysis of adhesions, and small bowel resection with ventral hernia repair. FIndings were remarkable for substantial small bowel adhesions, hwoever the mass that had been seen on  imaging was not appreciated intraperitoneally. There was white nodularity and stranding on the mesentery of a segment of small bowel. It was completely resected with the small bowel resection. Pathology was benign.  Postoperatively she was noted to have very high blood glucose on acuchecks. An HbA1C was drawn on 07/07/15 and was very elevated at 10.1%. She was started on Insulin.   Interval Hx: She has been doing well with no new complaints. She has persistent low level back pain. She has persistent neuropathy in her feet. Her abdominal wall pain is reducing over time.  Current Outpatient Prescriptions on File Prior to Visit  Medication Sig Dispense Refill  . ACCU-CHEK SMARTVIEW test strip TID and a bedtime. E11.9 100 each 0  . acetaminophen (TYLENOL) 500 MG tablet Take 1,000 mg by mouth every 6 (six) hours as needed for mild pain or headache.    Marland Kitchen amLODipine (NORVASC) 10 MG tablet Take 1 tablet (10 mg total) by mouth daily. 90 tablet 3  . atorvastatin (LIPITOR) 20 MG tablet Take 1 tablet (20 mg total) by mouth daily. 90 tablet 3  . blood glucose meter kit and supplies Dispense based on patient and insurance preference. Use up to four times daily as directed. (FOR ICD-9 250.00, 250.01). 1 each 0  . gabapentin (NEURONTIN) 100 MG capsule Take 1 capsule (100 mg total) by mouth 3 (three) times daily. 90 capsule 3  . glipiZIDE (GLUCOTROL) 5 MG tablet Take 1 tablet (5 mg total) by mouth 2 (two) times daily before a meal. 60 tablet 3  . Insulin Glargine (LANTUS) 100 UNIT/ML Solostar Pen Inject 12 Units into the skin daily. 15 mL 11  . Insulin Pen Needle 31G X 5 MM MISC For use with lantus pen for injections once daily 50 each 10  . Lancets (ACCU-CHEK MULTICLIX) lancets   0  . latanoprost (XALATAN) 0.005 % ophthalmic solution Place 1 drop into both eyes at bedtime.    Marland Kitchen lisinopril-hydrochlorothiazide (PRINZIDE,ZESTORETIC) 10-12.5 MG tablet Take 1 tablet by mouth daily. 90 tablet 3  . metFORMIN  (GLUCOPHAGE XR) 500 MG 24 hr tablet Take 2 tablets (1,000 mg total) by mouth 2 (two) times daily with a meal. 120 tablet 3  . metoprolol tartrate (LOPRESSOR) 25 MG tablet TAKE 1/2 TABLET BY MOUTH 2 TIMES A DAY 30 tablet 2   No current facility-administered medications on file prior to visit.   Allergies  Allergen Reactions  . Emend [Aprepitant] Other (See Comments)    Urticaria    Past Medical History  Diagnosis Date  . Hypertension   . Glucagonoma 07/28/14    Pt denies this but states she has glaucoma  . Glaucoma 02/16  . Family history of colon cancer   . Wears glasses   . Shortness of breath dyspnea     hx of 2013 - no problems currently   . Thyroid nodule     history of   . Nocturia   . Arthritis   . Peritoneal carcinomatosis ([place])     carcinoma of ovary   . History of chemotherapy   . Imbalance   . Neuropathy ([place])     feet bilat   Past Surgical History  Procedure Laterality Date  . Abdominal hysterectomy    . Thyroidectomy, partial    . Robotic assisted total hysterectomy with bilateral salpingo oopherectomy  Bilateral 07/28/2014    Procedure: ROBOTIC ASSISTED lysis of adhesions with biopsies, converted to LAPAROTOMY, bilateral salpingoorphorectomy, omentectomy,appendectomy;  Surgeon: Janie Morning, MD;  Location: [facility];  Service: Gynecology;  Laterality: Bilateral;  . Laparotomy N/A 07/06/2015    Procedure: EXPLORATORY LAPAROTOMY;  Surgeon: Everitt Amber, MD;  Location: [facility];  Service: Gynecology;  Laterality: N/A;  . Lysis of adhesion N/A 07/06/2015    Procedure:  LYSIS OF ADHESION RESECTION OF MESENTERIC MASS BOWEL RESECTION ;  Surgeon: Everitt Amber, MD;  Location: [facility];  Service: Gynecology;  Laterality: N/A;  . Incisional hernia repair N/A 07/06/2015    Procedure: INCISIONAL HERNIA REPAIR ;  Surgeon: Rolm Bookbinder, MD;  Location: [facility];  Service: General;  Laterality: N/A;  . Insertion of mesh N/A 07/06/2015    Procedure: WITH INSERTION OF PHASIX ST MESH;   Surgeon: Rolm Bookbinder, MD;  Location: [facility];  Service: General;  Laterality: N/A;   Family History  Problem Relation Age of Onset  . Hypertension Mother   . Hypertension Father   . Diabetes Father   . Cancer Sister 50    fibrosarcoma (back); currently 49  . Prostate cancer Maternal Uncle   . Colon cancer Paternal Aunt     Dx 1s; deceased 73s  . Prostate cancer Paternal Uncle     currently 57  . Cancer Paternal Uncle 68    "bone" ; unk. primary  . Stomach cancer Paternal Uncle    Social History   Social History  . Marital Status: Married    Spouse Name: N/A  . Number of Children: N/A  . Years of Education: N/A   Occupational History  . Not on file.   Social History Main Topics  . Smoking status: Never Smoker   . Smokeless tobacco: Never Used  . Alcohol Use: No  . Drug Use: No  . Sexual Activity: Not on file   Other Topics Concern  . Not on file   Social History Narrative    Review of systems: Constitutional:  She has no weight gain or weight loss. She has no fever or chills. Eyes: No blurred vision Ears, Nose, Mouth, Throat: No dizziness, headaches or changes in hearing. No mouth sores. Cardiovascular: No chest pain, palpitations or edema. Respiratory:  No shortness of breath, wheezing or cough Gastrointestinal: She has normal bowel movements without diarrhea or constipation. She denies any nausea or vomiting. She denies blood in her stool or heart burn. Abdominal pain Genitourinary:  She denies pelvic pain, pelvic pressure or changes in her urinary function. She has no hematuria, dysuria, or incontinence. She has no irregular vaginal bleeding or vaginal discharge Musculoskeletal: Denies muscle weakness or joint pains.  Skin:  She has no skin changes, rashes or itching Neurological:  Denies dizziness or headaches. + bilateral foot neuropathy Psychiatric:  She denies depression or anxiety. Hematologic/Lymphatic:   No easy bruising or bleeding   Physical  Exam: Blood pressure 127/76, pulse 67, temperature 98.2 F (36.8 C), temperature source Oral, resp. rate 18, height 5' 5"  (1.651 m), weight 217 lb (98.431 kg), SpO2 99 %. General: Well dressed, well nourished in no apparent distress.   HEENT:  Normocephalic and atraumatic, no lesions.  Extraocular muscles intact. Sclerae anicteric. Pupils equal, round, reactive. No mouth sores or ulcers. Thyroid is normal size, not nodular, midline. Abdomen:  Soft, nontender, nondistended.  No palpable masses.  No hepatosplenomegaly.  No ascites. Normal bowel sounds.  No hernias.  Incision is healed.No hernia. No erythema. Not  tender to palpate. Genitourinary: No pelvic masses. Extremities: No cyanosis, clubbing or edema.  No calf tenderness or erythema. No palpable cords. Psychiatric: Mood and affect are appropriate. Neurological: Awake, alert and oriented x 3. Sensation is intact, no neuropathy.  Musculoskeletal: No pain, normal strength and range of motion.  Donaciano Eva, MD

## 2015-10-26 ENCOUNTER — Encounter: Payer: Self-pay | Admitting: Internal Medicine

## 2015-10-26 ENCOUNTER — Ambulatory Visit: Payer: Medicaid Other | Attending: Internal Medicine | Admitting: Internal Medicine

## 2015-10-26 VITALS — BP 120/80 | HR 69 | Temp 98.0°F | Resp 18 | Ht 65.0 in | Wt 214.0 lb

## 2015-10-26 DIAGNOSIS — M545 Low back pain, unspecified: Secondary | ICD-10-CM

## 2015-10-26 DIAGNOSIS — C786 Secondary malignant neoplasm of retroperitoneum and peritoneum: Secondary | ICD-10-CM | POA: Diagnosis not present

## 2015-10-26 DIAGNOSIS — Z794 Long term (current) use of insulin: Secondary | ICD-10-CM | POA: Diagnosis not present

## 2015-10-26 DIAGNOSIS — E785 Hyperlipidemia, unspecified: Secondary | ICD-10-CM

## 2015-10-26 DIAGNOSIS — Z9221 Personal history of antineoplastic chemotherapy: Secondary | ICD-10-CM | POA: Diagnosis not present

## 2015-10-26 DIAGNOSIS — R0602 Shortness of breath: Secondary | ICD-10-CM | POA: Diagnosis not present

## 2015-10-26 DIAGNOSIS — M1990 Unspecified osteoarthritis, unspecified site: Secondary | ICD-10-CM | POA: Insufficient documentation

## 2015-10-26 DIAGNOSIS — Z808 Family history of malignant neoplasm of other organs or systems: Secondary | ICD-10-CM | POA: Diagnosis not present

## 2015-10-26 DIAGNOSIS — Z7984 Long term (current) use of oral hypoglycemic drugs: Secondary | ICD-10-CM | POA: Diagnosis not present

## 2015-10-26 DIAGNOSIS — Z79899 Other long term (current) drug therapy: Secondary | ICD-10-CM | POA: Insufficient documentation

## 2015-10-26 DIAGNOSIS — C561 Malignant neoplasm of right ovary: Secondary | ICD-10-CM

## 2015-10-26 DIAGNOSIS — E119 Type 2 diabetes mellitus without complications: Secondary | ICD-10-CM | POA: Diagnosis not present

## 2015-10-26 DIAGNOSIS — C569 Malignant neoplasm of unspecified ovary: Secondary | ICD-10-CM | POA: Diagnosis not present

## 2015-10-26 DIAGNOSIS — I10 Essential (primary) hypertension: Secondary | ICD-10-CM | POA: Diagnosis not present

## 2015-10-26 LAB — GLUCOSE, POCT (MANUAL RESULT ENTRY): POC Glucose: 74 mg/dl (ref 70–99)

## 2015-10-26 LAB — HEMOGLOBIN A1C
HEMOGLOBIN A1C: 6.4 % — AB (ref <5.7)
MEAN PLASMA GLUCOSE: 137 mg/dL

## 2015-10-26 MED ORDER — ACETAMINOPHEN-CODEINE #3 300-30 MG PO TABS: 1.0000 | ORAL_TABLET | ORAL | Status: DC | PRN

## 2015-10-26 MED FILL — ACETAMINOPHEN/COD #3 TABLET: 300-30 | 20 days supply | Qty: 60 | Fill #0

## 2015-10-26 NOTE — Progress Notes (Signed)
Patient ID: Sarah [name], female   DOB: 11/17/1965, 50 y.o.   MRN: [id number]   Sarah [name], is a 50 y.o. female  VXB:[id number]  [id number]  DOB - 1966-05-15  Chief Complaint  Patient presents with  . Follow-up    DM        Subjective:   Sarah [name] is a 50 y.o. 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 S/p adjuvant chemotherapy with 6 cycles of paclitaxel and carboplatin completed on 12/08/14. S/p an ex lap, hernia repair and small bowel resection for a presumed recurrence (benign pathology) here today for a follow up visit of HTN and DM. Patient is doing well, has no new complaints today except for ongoing low back pain.  A CT of the chest, abdomen and pelvis was performed on 04/27/15 which showed: "No pulmonary mass, infiltrate, or effusion. Tiny less than 5 mm subpleural pulmonary nodules in the right upper lobe on image remain stable. No new or enlarging pulmonary nodules identified. Small left paraventral hernia containing bowel loops. New soft tissue nodularity is seen within the anterior abdominal mesenteric fat, with largest nodule measuring 10 x 17 mm. No other sites of peritoneal nodularity or soft tissue density seen.   On 07/06/15 she was taken to the OR for an ex lap, lysis of adhesions, and small bowel resection with ventral hernia repair. Findings were remarkable for substantial small bowel adhesions, however the mass that had been seen on imaging was not appreciated intraperitoneally. There was white nodularity and stranding on the mesentery of a segment of small bowel. It was completely resected with the small bowel resection. Pathology was benign. Postoperatively she was noted to have very high blood glucose on acuchecks. An HbA1C was drawn on 07/07/15 and was very elevated at 10.1%. She was started on Insulin.  ALLERGIES: Allergies  Allergen  Reactions  . Emend [Aprepitant] Other (See Comments)    Urticaria     PAST MEDICAL HISTORY: Past Medical History  Diagnosis Date  . Hypertension   . Glucagonoma 07/28/14    Pt denies this but states she has glaucoma  . Glaucoma 02/16  . Family history of colon cancer   . Wears glasses   . Shortness of breath dyspnea     hx of 2013 - no problems currently   . Thyroid nodule     history of   . Nocturia   . Arthritis   . Peritoneal carcinomatosis ([place])     carcinoma of ovary   . History of chemotherapy   . Imbalance   . Neuropathy ([place])     feet bilat    MEDICATIONS AT HOME: Prior to Admission medications   Medication Sig Start Date End Date Taking? Authorizing Provider  ACCU-CHEK SMARTVIEW test strip TID and a bedtime. E11.9 07/28/15  Yes Tresa Garter, MD  acetaminophen (TYLENOL) 500 MG tablet Take 1,000 mg by mouth every 6 (six) hours as needed for mild pain or headache.   Yes Historical Provider, MD  amLODipine (NORVASC) 10 MG tablet Take 1 tablet (10 mg total) by mouth daily. 07/27/15  Yes Tresa Garter, MD  atorvastatin (LIPITOR) 20 MG tablet Take 1 tablet (20 mg total) by mouth daily. 07/27/15  Yes Tresa Garter, MD  blood glucose meter kit and supplies Dispense based on patient and insurance preference. Use up to four times daily as directed. (FOR ICD-9 250.00, 250.01). 07/12/15  Yes  Dorothyann Gibbs, NP  gabapentin (NEURONTIN) 100 MG capsule Take 1 capsule (100 mg total) by mouth 3 (three) times daily. 07/27/15  Yes Nyeem Stoke Essie Christine, MD  glipiZIDE (GLUCOTROL) 5 MG tablet Take 1 tablet (5 mg total) by mouth 2 (two) times daily before a meal. 07/27/15  Yes Zayven Powe E Doreene Burke, MD  Insulin Glargine (LANTUS) 100 UNIT/ML Solostar Pen Inject 12 Units into the skin daily. 08/16/15  Yes Tresa Garter, MD  Insulin Pen Needle 31G X 5 MM MISC For use with lantus pen for injections once daily 07/12/15  Yes Dorothyann Gibbs, NP  Lancets (ACCU-CHEK MULTICLIX) lancets   07/12/15  Yes Historical Provider, MD  latanoprost (XALATAN) 0.005 % ophthalmic solution Place 1 drop into both eyes at bedtime.   Yes Historical Provider, MD  lisinopril-hydrochlorothiazide (PRINZIDE,ZESTORETIC) 10-12.5 MG tablet Take 1 tablet by mouth daily. 07/27/15  Yes Tresa Garter, MD  metFORMIN (GLUCOPHAGE XR) 500 MG 24 hr tablet Take 2 tablets (1,000 mg total) by mouth 2 (two) times daily with a meal. 10/11/15  Yes Darrill Vreeland E Doreene Burke, MD  metoprolol tartrate (LOPRESSOR) 25 MG tablet TAKE 1/2 TABLET BY MOUTH 2 TIMES A DAY 10/09/15  Yes Kirbi Farrugia E Doreene Burke, MD  timolol (TIMOPTIC) 0.5 % ophthalmic solution Place 1 drop into both eyes daily.   Yes Historical Provider, MD  acetaminophen-codeine (TYLENOL #3) 300-30 MG tablet Take 1 tablet by mouth every 4 (four) hours as needed. 10/26/15   Tresa Garter, MD     Objective:   Filed Vitals:   10/26/15 0933  BP: 120/80  Pulse: 69  Temp: 98 F (36.7 C)  TempSrc: Oral  Resp: 18  Height: _0  (1.651 m)  Weight: 214 lb (97.07 kg)  SpO2: 97%    Exam General appearance : Pleasant woman, not ill-looking, Awake, alert, not in any distress. Speech Clear. Not toxic looking HEENT: Atraumatic and Normocephalic, pupils equally reactive to light and accomodation Neck: supple, no JVD. No cervical lymphadenopathy.  Chest:Good air entry bilaterally, no added sounds  CVS: S1 S2 regular, no murmurs.  Abdomen: Bowel sounds present, Non tender and not distended with no gaurding, rigidity or rebound. Extremities: B/L Lower Ext shows no edema, both legs are warm to touch Neurology: Awake alert, and oriented X 3, CN II-XII intact, Non focal Skin: No Rash  Data Review Lab Results  Component Value Date   HGBA1C 10.1* 07/07/2015     Assessment & Plan   1. Type 2 diabetes mellitus without complication, with long-term current use of insulin (HCC)  - Glucose (CBG) - Hemoglobin A1c  Aim for 30 minutes of exercise most days. Rethink what you  drink. Water is great! Aim for 2-3 Carb Choices per meal (30-45 grams) +/- 1 either way  Aim for 0-15 Carbs per snack if hungry  Include protein in moderation with your meals and snacks  Consider reading food labels for Total Carbohydrate and Fat Grams of foods  Consider checking BG at alternate times per day  Continue taking medication as directed Be mindful about how much sugar you are adding to beverages and other foods. Fruit Punch - find one with no sugar  Measure and decrease portions of carbohydrate foods  Make your plate and don't go back for seconds  2. Essential hypertension  We have discussed target BP range and blood pressure goal. I have advised patient to check BP regularly and to call us back or report to clinic if the numbers are consistently higher  than 140/90. We discussed the importance of compliance with medical therapy and DASH diet recommended, consequences of uncontrolled hypertension discussed.   - continue current BP medications  3. Ovarian cancer, right ([place])  Follow up with Gynecology Oncologist  4. Dyslipidemia  To address this please limit saturated fat to no more than 7% of your calories, limit cholesterol to 200 mg/day, increase fiber and exercise as tolerated. If needed we may add another cholesterol lowering medication to your regimen.   5. Midline low back pain without sciatica  - DG Lumbar Spine Complete; Future - acetaminophen-codeine (TYLENOL #3) 300-30 MG tablet; Take 1 tablet by mouth every 4 (four) hours as needed.  Dispense: 60 tablet; Refill: 0  Patient have been counseled extensively about nutrition and exercise  Return in about 3 months (around 01/26/2016) for Hemoglobin A1C and Follow up, DM, Follow up Pain and comorbidities.  The patient was given clear instructions to go to ER or return to medical center if symptoms don't improve, worsen or new problems develop. The patient verbalized understanding. The patient was told to call to get  lab results if they haven't heard anything in the next week.   This note has been created with Surveyor, quantity. Any transcriptional errors are unintentional.    Angelica Chessman, MD, [place], Karilyn Cota, [facility], [place]   10/26/2015, 10:30 AM

## 2015-10-26 NOTE — Progress Notes (Signed)
Patient is here for FU DM  Patient denies pain at this time.  Patient has taken medication today. Patient has not eaten.  Patient denies any suicidal ideations at this time.

## 2015-10-26 NOTE — Patient Instructions (Signed)
Daily Diabetes Record Check your blood glucose (BG) as directed by your health care provider. Use this form to record your results as well as any diabetes medicines you take, including insulin. Checking your BG, recording it, and bringing your records to your health care provider is very helpful in managing your diabetes. These numbers help your health care provider know if any changes are needed to your diabetes plan.  Week of _____________________________ Date: _________  Sarah [name], BG/Medicines: ________________ / __________________________________________________________  LUNCH, BG/Medicines: ____________________ / __________________________________________________________  Sarah [name], BG/Medicines: ___________________ / __________________________________________________________  BEDTIME, BG/Medicines: __________________ / __________________________________________________________ Date: _________  Sarah [name], BG/Medicines: ________________ / __________________________________________________________  LUNCH, BG/Medicines: ____________________ / __________________________________________________________  Sarah [name], BG/Medicines: ___________________ / __________________________________________________________  BEDTIME, BG/Medicines: __________________ / __________________________________________________________ Date: _________  Sarah [name], BG/Medicines: ________________ / __________________________________________________________  LUNCH, BG/Medicines: ____________________ / __________________________________________________________  Sarah [name], BG/Medicines: ___________________ / __________________________________________________________  BEDTIME, BG/Medicines: __________________ / __________________________________________________________ Date: _________  Sarah [name], BG/Medicines: ________________ / __________________________________________________________  LUNCH, BG/Medicines:  ____________________ / __________________________________________________________  Sarah [name], BG/Medicines: ___________________ / __________________________________________________________  BEDTIME, BG/Medicines: __________________ / __________________________________________________________ Date: _________  Sarah [name], BG/Medicines: ________________ / __________________________________________________________  LUNCH, BG/Medicines: ____________________ / __________________________________________________________  Sarah [name], BG/Medicines: ___________________ / __________________________________________________________  BEDTIME, BG/Medicines: __________________ / __________________________________________________________ Date: _________  Sarah [name], BG/Medicines: ________________ / __________________________________________________________  LUNCH, BG/Medicines: ____________________ / __________________________________________________________  Sarah [name], BG/Medicines: ___________________ / __________________________________________________________  BEDTIME, BG/Medicines: __________________ / __________________________________________________________ Date: _________  Sarah [name], BG/Medicines: ________________ / __________________________________________________________  LUNCH, BG/Medicines: ____________________ / __________________________________________________________  Sarah [name], BG/Medicines: ___________________ / __________________________________________________________  BEDTIME, BG/Medicines: __________________ / __________________________________________________________ Notes: __________________________________________________________________________________________________   This information is not intended to replace advice given to you by your health care provider. Make sure you discuss any questions you have with your health care provider.   Document Released: 04/30/2004 Document  Revised: 06/17/2014 Document Reviewed: 07/21/2013 Elsevier Interactive Patient Education 2016 [employer]. Diabetes and Exercise Exercising regularly is important. It is not just about losing weight. It has many health benefits, such as:  Improving your overall fitness, flexibility, and endurance.  Increasing your bone density.  Helping with weight control.  Decreasing your body fat.  Increasing your muscle strength.  Reducing stress and tension.  Improving your overall health. People with diabetes who exercise gain additional benefits because exercise:  Reduces appetite.  Improves the body's use of blood sugar (glucose).  Helps lower or control blood glucose.  Decreases blood pressure.  Helps control blood lipids (such as cholesterol and triglycerides).  Improves the body's use of the hormone insulin by:  Increasing the body's insulin sensitivity.  Reducing the body's insulin needs.  Decreases the risk for heart disease because exercising:  Lowers cholesterol and triglycerides levels.  Increases the levels of good cholesterol (such as high-density lipoproteins [HDL]) in the body.  Lowers blood glucose levels. YOUR ACTIVITY PLAN  Choose an activity that you enjoy, and set realistic goals. To exercise safely, you should begin practicing any new physical activity slowly, and gradually increase the intensity of the exercise over time. Your health care provider or diabetes educator can help create an activity plan that works for you. General recommendations include:  Encouraging children to engage in at least 60 minutes of physical activity each day.  Stretching and performing strength training exercises, such as yoga or weight lifting, at least 2 times per week.  Performing a total of at least 150 minutes of moderate-intensity exercise each week, such as brisk walking or water aerobics.  Exercising at least 3 days per week, making sure you allow no more than 2  consecutive days to pass  without exercising.  Avoiding long periods of inactivity (90 minutes or more). When you have to spend an extended period of time sitting down, take frequent breaks to walk or stretch. RECOMMENDATIONS FOR EXERCISING WITH TYPE 1 OR TYPE 2 DIABETES   Check your blood glucose before exercising. If blood glucose levels are greater than 240 mg/dL, check for urine ketones. Do not exercise if ketones are present.  Avoid injecting insulin into areas of the body that are going to be exercised. For example, avoid injecting insulin into:  The arms when playing tennis.  The legs when jogging.  Keep a record of:  Food intake before and after you exercise.  Expected peak times of insulin action.  Blood glucose levels before and after you exercise.  The type and amount of exercise you have done.  Review your records with your health care provider. Your health care provider will help you to develop guidelines for adjusting food intake and insulin amounts before and after exercising.  If you take insulin or oral hypoglycemic agents, watch for signs and symptoms of hypoglycemia. They include:  Dizziness.  Shaking.  Sweating.  Chills.  Confusion.  Drink plenty of water while you exercise to prevent dehydration or heat stroke. Body water is lost during exercise and must be replaced.  Talk to your health care provider before starting an exercise program to make sure it is safe for you. Remember, almost any type of activity is better than none.   This information is not intended to replace advice given to you by your health care provider. Make sure you discuss any questions you have with your health care provider.   Document Released: 08/17/2003 Document Revised: 10/11/2014 Document Reviewed: 11/03/2012 Elsevier Interactive Patient Education 2016 [place] Carbohydrate Counting for Diabetes Mellitus Carbohydrate counting is a method for keeping track of the  amount of carbohydrates you eat. Eating carbohydrates naturally increases the level of sugar (glucose) in your blood, so it is important for you to know the amount that is okay for you to have in every meal. Carbohydrate counting helps keep the level of glucose in your blood within normal limits. The amount of carbohydrates allowed is different for every person. A dietitian can help you calculate the amount that is right for you. Once you know the amount of carbohydrates you can have, you can count the carbohydrates in the foods you want to eat. Carbohydrates are found in the following foods:  Grains, such as breads and cereals.  Dried beans and soy products.  Starchy vegetables, such as potatoes, peas, and corn.  Fruit and fruit juices.  Milk and yogurt.  Sweets and snack foods, such as cake, cookies, candy, chips, soft drinks, and fruit drinks. CARBOHYDRATE COUNTING There are two ways to count the carbohydrates in your food. You can use either of the methods or a combination of both. Reading the "Nutrition Facts" on [place] The "Nutrition Facts" is an area that is included on the labels of almost all packaged food and beverages in the [place]. It includes the serving size of that food or beverage and information about the nutrients in each serving of the food, including the grams (g) of carbohydrate per serving.  Decide the number of servings of this food or beverage that you will be able to eat or drink. Multiply that number of servings by the number of grams of carbohydrate that is listed on the label for that serving. The total will be the amount of  carbohydrates you will be having when you eat or drink this food or beverage. Learning Standard Serving Sizes of Food When you eat food that is not packaged or does not include "Nutrition Facts" on the label, you need to measure the servings in order to count the amount of carbohydrates.A serving of most carbohydrate-rich foods  contains about 15 g of carbohydrates. The following list includes serving sizes of carbohydrate-rich foods that provide 15 g ofcarbohydrate per serving:   1 slice of bread (1 oz) or 1 six-inch tortilla.    of a hamburger bun or English muffin.  4-6 crackers.   cup unsweetened dry cereal.    cup hot cereal.   cup rice or pasta.    cup mashed potatoes or  of a large baked potato.  1 cup fresh fruit or one small piece of fruit.    cup canned or frozen fruit or fruit juice.  1 cup milk.   cup plain fat-free yogurt or yogurt sweetened with artificial sweeteners.   cup cooked dried beans or starchy vegetable, such as peas, corn, or potatoes.  Decide the number of standard-size servings that you will eat. Multiply that number of servings by 15 (the grams of carbohydrates in that serving). For example, if you eat 2 cups of strawberries, you will have eaten 2 servings and 30 g of carbohydrates (2 servings x 15 g = 30 g). For foods such as soups and casseroles, in which more than one food is mixed in, you will need to count the carbohydrates in each food that is included. EXAMPLE OF CARBOHYDRATE COUNTING Sample Dinner  3 oz chicken breast.   cup of brown rice.   cup of corn.  1 cup milk.   1 cup strawberries with sugar-free whipped topping.  Carbohydrate Calculation Step 1: Identify the foods that contain carbohydrates:   Rice.   Corn.   Milk.   Strawberries. Step 2:Calculate the number of servings eaten of each:   2 servings of rice.   1 serving of corn.   1 serving of milk.   1 serving of strawberries. Step 3: Multiply each of those number of servings by 15 g:   2 servings of rice x 15 g = 30 g.   1 serving of corn x 15 g = 15 g.   1 serving of milk x 15 g = 15 g.   1 serving of strawberries x 15 g = 15 g. Step 4: Add together all of the amounts to find the total grams of carbohydrates eaten: 30 g + 15 g + 15 g + 15 g = 75 g.     This information is not intended to replace advice given to you by your health care provider. Make sure you discuss any questions you have with your health care provider.   Document Released: 05/27/2005 Document Revised: 06/17/2014 Document Reviewed: 04/23/2013 Elsevier Interactive Patient Education [employer].

## 2015-10-27 ENCOUNTER — Ambulatory Visit (HOSPITAL_COMMUNITY)
Admission: RE | Admit: 2015-10-27 | Discharge: 2015-10-27 | Disposition: A | Discharge status: Home / Self Care | Payer: Medicaid Other | Source: Ambulatory Visit | Attending: Internal Medicine | Admitting: Internal Medicine

## 2015-10-27 DIAGNOSIS — M545 Low back pain, unspecified: Secondary | ICD-10-CM

## 2015-10-30 ENCOUNTER — Telehealth: Payer: Self-pay | Admitting: Internal Medicine

## 2015-10-30 NOTE — Telephone Encounter (Signed)
Patient is calling regarding on xray results, and A1C.....  Please follow up with patient

## 2015-11-07 MED FILL — glipiZIDE 5 MG TABS: 5 | 30 days supply | Qty: 60 | Fill #3

## 2015-11-08 NOTE — Telephone Encounter (Signed)
-----   Message from Tresa Garter, MD sent at 11/01/2015 12:44 PM EDT ----- Please inform patient that her lumbar spine x-ray is negative. Advise regular physical exercise as tolerated and continue pain medication.

## 2015-11-08 NOTE — Telephone Encounter (Signed)
Patient verified DOB Patient is aware of lumbar x-ray being negative. Patient advised to implement physical exercise and continue with pain medication. Patient is aware of A1C being 6.4 and DM being 6.5. Patient advised to implement lifestyle and diet changes to assist in lowering her A1C. Patient expressed her understanding and had no further questions at this time.

## 2015-11-13 MED FILL — METOPROLOL TARTRATE 25 MG T: 25 | 30 days supply | Qty: 30 | Fill #1

## 2015-11-13 MED FILL — AMLODIPINE BESYLATE 10 MG T: 10 | 30 days supply | Qty: 30 | Fill #3

## 2015-11-13 MED FILL — ATORVASTATIN 20 MG TABLET: 20 | 30 days supply | Qty: 30 | Fill #3

## 2015-11-13 MED FILL — LISINOPRIL-HCTZ 10-12.5 MG: 10-12.5 | 30 days supply | Qty: 30 | Fill #3

## 2015-11-13 MED FILL — METFORMIN HCL ER 500 MG TAB: 500 | 30 days supply | Qty: 120 | Fill #1

## 2015-11-29 MED FILL — LANTUS SOLOSTAR 100 UNITS/M: 100 | 25 days supply | Qty: 3 | Fill #1

## 2015-12-11 ENCOUNTER — Other Ambulatory Visit: Payer: Self-pay | Admitting: Internal Medicine

## 2015-12-11 MED FILL — ATORVASTATIN 20 MG TABLET: 20 | 30 days supply | Qty: 30 | Fill #4

## 2015-12-11 MED FILL — METOPROLOL TARTRATE 25 MG T: 25 | 30 days supply | Qty: 30 | Fill #2

## 2015-12-11 MED FILL — LISINOPRIL-HCTZ 10-12.5 MG: 10-12.5 | 30 days supply | Qty: 30 | Fill #4

## 2015-12-11 MED FILL — AMLODIPINE BESYLATE 10 MG T: 10 | 30 days supply | Qty: 30 | Fill #4

## 2015-12-13 MED FILL — glipiZIDE 5 MG TABS: 5 | 30 days supply | Qty: 60 | Fill #0

## 2015-12-13 MED FILL — METFORMIN HCL ER 500 MG TAB: 500 | 30 days supply | Qty: 120 | Fill #2

## 2015-12-13 NOTE — Telephone Encounter (Signed)
Patient called requesting medication refill on glipiZIDE (GLUCOTROL) 5 MG tablet

## 2016-01-05 ENCOUNTER — Telehealth: Payer: Self-pay | Admitting: Internal Medicine

## 2016-01-05 MED ORDER — ACCU-CHEK SMARTVIEW VI STRP: ORAL_STRIP | 0 refills | Status: DC

## 2016-01-05 MED FILL — BD PEN NDL MINI 31GX5MM: 31G X 5 MM | 90 days supply | Qty: 100 | Fill #0

## 2016-01-05 MED FILL — ACCU-CHEK SMARTVIEW STRIP: 30 days supply | Qty: 100 | Fill #0

## 2016-01-05 NOTE — Telephone Encounter (Signed)
Test strips refilled

## 2016-01-05 NOTE — Telephone Encounter (Signed)
Medication Refill:  ACCU-CHEK SMARTVIEW test strip and Insulin Needles

## 2016-01-11 MED FILL — METOPROLOL TARTRATE 25 MG T: 25 | 30 days supply | Qty: 30 | Fill #3

## 2016-01-11 MED FILL — ATORVASTATIN 20 MG TABLET: 20 | 30 days supply | Qty: 30 | Fill #5

## 2016-01-11 MED FILL — AMLODIPINE BESYLATE 10 MG T: 10 | 30 days supply | Qty: 30 | Fill #5

## 2016-01-11 MED FILL — LANTUS SOLOSTAR 100 UNITS/M: 100 | 25 days supply | Qty: 3 | Fill #2

## 2016-01-11 MED FILL — LISINOPRIL-HCTZ 10-12.5 MG: 10-12.5 | 30 days supply | Qty: 30 | Fill #5

## 2016-01-15 MED FILL — glipiZIDE 5 MG TABS: 5 | 30 days supply | Qty: 60 | Fill #1

## 2016-01-18 MED FILL — METFORMIN HCL ER 500 MG TAB: 500 | 30 days supply | Qty: 120 | Fill #3

## 2016-01-21 ENCOUNTER — Other Ambulatory Visit: Payer: Self-pay | Admitting: Oncology

## 2016-01-22 ENCOUNTER — Encounter: Payer: Self-pay | Admitting: Oncology

## 2016-01-22 ENCOUNTER — Telehealth: Payer: Self-pay | Admitting: Oncology

## 2016-01-22 ENCOUNTER — Other Ambulatory Visit (HOSPITAL_BASED_OUTPATIENT_CLINIC_OR_DEPARTMENT_OTHER): Payer: Medicaid Other

## 2016-01-22 ENCOUNTER — Ambulatory Visit (HOSPITAL_BASED_OUTPATIENT_CLINIC_OR_DEPARTMENT_OTHER): Payer: Medicaid Other | Admitting: Oncology

## 2016-01-22 VITALS — BP 139/93 | HR 69 | Temp 98.5°F | Resp 18 | Ht 65.0 in | Wt 215.3 lb

## 2016-01-22 DIAGNOSIS — G620 Drug-induced polyneuropathy: Secondary | ICD-10-CM

## 2016-01-22 DIAGNOSIS — E669 Obesity, unspecified: Secondary | ICD-10-CM

## 2016-01-22 DIAGNOSIS — K432 Incisional hernia without obstruction or gangrene: Secondary | ICD-10-CM

## 2016-01-22 DIAGNOSIS — C482 Malignant neoplasm of peritoneum, unspecified: Secondary | ICD-10-CM | POA: Diagnosis present

## 2016-01-22 DIAGNOSIS — T451X5A Adverse effect of antineoplastic and immunosuppressive drugs, initial encounter: Secondary | ICD-10-CM

## 2016-01-22 LAB — CBC WITH DIFFERENTIAL/PLATELET
BASO%: 1.1 % (ref 0.0–2.0)
BASOS ABS: 0.1 10*3/uL (ref 0.0–0.1)
EOS%: 0.9 % (ref 0.0–7.0)
Eosinophils Absolute: 0.1 10*3/uL (ref 0.0–0.5)
HEMATOCRIT: 38.5 % (ref 34.8–46.6)
HGB: 12.7 g/dL (ref 11.6–15.9)
LYMPH#: 2.8 10*3/uL (ref 0.9–3.3)
LYMPH%: 35.6 % (ref 14.0–49.7)
MCH: 30 pg (ref 25.1–34.0)
MCHC: 32.9 g/dL (ref 31.5–36.0)
MCV: 91.1 fL (ref 79.5–101.0)
MONO#: 0.8 10*3/uL (ref 0.1–0.9)
MONO%: 9.9 % (ref 0.0–14.0)
NEUT#: 4.2 10*3/uL (ref 1.5–6.5)
NEUT%: 52.5 % (ref 38.4–76.8)
Platelets: 328 10*3/uL (ref 145–400)
RBC: 4.23 10*6/uL (ref 3.70–5.45)
RDW: 15.1 % — ABNORMAL HIGH (ref 11.2–14.5)
WBC: 8 10*3/uL (ref 3.9–10.3)

## 2016-01-22 LAB — COMPREHENSIVE METABOLIC PANEL
ALT: 15 U/L (ref 0–55)
ANION GAP: 11 meq/L (ref 3–11)
AST: 20 U/L (ref 5–34)
Albumin: 3.7 g/dL (ref 3.5–5.0)
Alkaline Phosphatase: 66 U/L (ref 40–150)
BUN: 17 mg/dL (ref 7.0–26.0)
CALCIUM: 10.1 mg/dL (ref 8.4–10.4)
CHLORIDE: 100 meq/L (ref 98–109)
CO2: 30 meq/L — AB (ref 22–29)
Creatinine: 1.1 mg/dL (ref 0.6–1.1)
EGFR: 68 mL/min/{1.73_m2} — ABNORMAL LOW (ref >90)
Glucose: 66 mg/dl — ABNORMAL LOW (ref 70–140)
POTASSIUM: 3.5 meq/L (ref 3.5–5.1)
Sodium: 141 mEq/L (ref 136–145)
Total Bilirubin: 0.57 mg/dL (ref 0.20–1.20)
Total Protein: 8.2 g/dL (ref 6.4–8.3)

## 2016-01-22 NOTE — Progress Notes (Signed)
OFFICE PROGRESS NOTE   January 22, 2016   Physicians: Terrence Dupont Rossi/ W. 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  She had negative path at exploratory laparotomy by Dr [name] 06-2015.  She saw Dr [name] 10-23-15 and is to see her again next week. She has prn follow up now with [name], who did mesh repair of ventral hernia at the exploratory laparotomy 06-2015. Ventral abdominal hernia has reoccurred.  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 Note she is also diabetic. Gabapentin tried at hs (at low dose) was not helpful. She has noticed increase in ventral hernia in upper abdomen since the repair in Jan, not painful, "feels hard when I cough". Blood sugar was 124 this AM, PCP following every 3 months, next appointment upcoming. She has had intermittent NP cough "since chemo", which would not be related to the chemo, no SOB, no wheezing, no sinus congestion or drainage, nonsmoker and no one at home smokes. She does have frequent GERD, which could contribute to cough. Otherwise she is feeling well, with good energy, no abdominal or pelvic pain, no bleeding, bowels move regularly, no LE swelling. No noted changes in breasts. No other neuropathy.  Remainder of 10 point Review of Systems negative.    No PAC Genetics testing normal by [employer] panel 09-15-14 Pre-op CA 125 (06-30-14) 58   ONCOLOGIC HISTORY Patient presented to ED 06-28-14 with fairly acute onset of low back pain, without known trauma, so severe at that time that she was having difficulty standing and walking; blood pressure in ED was 260/148 such that she was admitted for hypertensive urgency.  Evaluation included CT CAP 06-28-14 which demonstrated a 4.4 cm right adnexal mass, with stranding in omentum and small fluid in pelvis, as well as mild mediastinal and hilar adenopathy. US showed 4.6 x 5.2 x 3.8 cm apparent complex ovarian mass. CA 125 was 58 on 06-30-14. She was seen in consultation by Dr [name] on 07-08-14. Imaging otherwise showed no aortic aneurysm; MRI of L spine 06-29-14 found some facet hypertrophy and small right paracentral disc protrusion L5S1. Blood pressure was treated and patient established with [facility], Dr Doreene Burke.  She was stable for surgery by Dr Skeet Latch 07-28-14, which was exploratory laparotomy with BSO, infragastric omentectomy, washings and biopsies and appendectomy. Pathology [id number]) found low grade serous carcinoma thruout. Post operative course was unremarkable. She saw Dr [name] on 08-03-14, with recommendation for 6 cycles of taxol and carboplatin then consideration of Megace maintenance; she will see Dr [name] again after chemo completes. Cycle 1 carbo taxol was 08-25-14, with leukopenia by day 8 such that granix was added x 3 doses. Delayed nausea was a problem with initial cycles, EMEND approved for cycle 3, helpful with nausea but likely allergic reaction to this cycle 4. Cycle 6 completed on 12-08-14. Patient had exploratory laparotomy 07-06-15 with lysis of adhesions and partial small bowel resection + mesh repair of ventral abdominal hernia. Pathology [phone] negative for malignancy  Objective:  Vital signs in last 24 hours:  BP (!) 139/93 (BP Location: Right Arm, Patient Position: Sitting)   Pulse 69   Temp 98.5 F (36.9 C) (Oral)   Resp 18   Ht  5' 5"  (1.651 m)   Wt 215 lb 4.8 oz (97.7 kg)   SpO2 100%   BMI 35.83 kg/m  Weight up 5 lbs from 09-2015. Respirations not labored, no cough during visit Alert, oriented and appropriate. Ambulatory without difficulty.    HEENT:PERRL, sclerae not icteric. Oral mucosa moist without  lesions, posterior pharynx clear.  Neck supple. No JVD.  Lymphatics:no cervical,supraclavicular, axillary or inguinal adenopathy Resp: clear to auscultation bilaterally and normal percussion bilaterally Cardio: regular rate and rhythm. No gallop. GI: abdomen obese, soft, nontender, not distended, no appreciable mass or organomegaly. Normally active bowel sounds. Surgical incision not remarkable. Large upper abdominal hernia apparent when she sits up from lying position, not tender. Musculoskeletal/ Extremities: without pitting edema, cords, tenderness Neuro: no peripheral neuropathy. Otherwise nonfocal Skin without rash, ecchymosis, petechiae Breasts: without dominant mass, skin or nipple findings. Axillae benign. Portacath-without erythema or tenderness  Lab Results:  Results for orders placed or performed in visit on 01/22/16  CBC with Differential  Result Value Ref Range   WBC 8.0 3.9 - 10.3 10e3/uL   NEUT# 4.2 1.5 - 6.5 10e3/uL   HGB 12.7 11.6 - 15.9 g/dL   HCT 38.5 34.8 - 46.6 %   Platelets 328 145 - 400 10e3/uL   MCV 91.1 79.5 - 101.0 fL   MCH 30.0 25.1 - 34.0 pg   MCHC 32.9 31.5 - 36.0 g/dL   RBC 4.23 3.70 - 5.45 10e6/uL   RDW 15.1 (H) 11.2 - 14.5 %   lymph# 2.8 0.9 - 3.3 10e3/uL   MONO# 0.8 0.1 - 0.9 10e3/uL   Eosinophils Absolute 0.1 0.0 - 0.5 10e3/uL   Basophils Absolute 0.1 0.0 - 0.1 10e3/uL   NEUT% 52.5 38.4 - 76.8 %   LYMPH% 35.6 14.0 - 49.7 %   MONO% 9.9 0.0 - 14.0 %   EOS% 0.9 0.0 - 7.0 %   BASO% 1.1 0.0 - 2.0 %  Comprehensive metabolic panel  Result Value Ref Range   Sodium 141 136 - 145 mEq/L   Potassium 3.5 3.5 - 5.1 mEq/L   Chloride 100 98 - 109 mEq/L   CO2 30 (H) 22 - 29 mEq/L   Glucose 66 (L) 70 - 140 mg/dl   BUN 17.0 7.0 - 26.0 mg/dL   Creatinine 1.1 0.6 - 1.1 mg/dL   Total Bilirubin 0.57 0.20 - 1.20 mg/dL   Alkaline Phosphatase 66 40 - 150 U/L   AST 20 5 - 34 U/L   ALT 15 0 - 55 U/L   Total Protein 8.2 6.4 - 8.3 g/dL   Albumin 3.7 3.5 - 5.0  g/dL   Calcium 10.1 8.4 - 10.4 mg/dL   Anion Gap 11 3 - 11 mEq/L   EGFR 68 (L) >90 ml/min/1.73 m2   Available after visit: CA 125 by new lab method 15.4, this having been 17.3 by same method in 09-2015   And by prior lab method today 10, compared with 11 and 12 prior.  Last Hgb A1c in 10-2015 <5.7  Studies/Results:  No results found.   [place] 04-09-16  Medications: I have reviewed the patient's current medications. She prefers to try regular OTC zantac or prilosec to prescription for GERD now (see above, may be related to chronic cough)  DISCUSSION Ventral abdominal hernia: patient will keep appointment with Dr [name] next week and discuss. She expresses reluctance to have another surgery.  Peripheral neuropathy: related to taxol, doubt plantar fascitis even with history of worse in AMs  due to location in distal feet, tho could ask podiatrist to evaluate if she would like.   Clinically doing well from standpoint of cancer by this exam and by marker available after visit, which we will let her know.  I have made return apt in Nov so as to alternate visits with gyn onc every 3 mo for next year,  but can move my visit  if timing not correct after upcoming visit with Dr [name].    Assessment/Plan:  1. 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 NED by scans, marker, exam and surgical pathology 06-2015.  Genetics testing negative 2. Recurrent upper abdominal ventral hernia: following mesh repair 06-2015. She will discuss with Dr [name] at visit next week. Note prn follow up with [name] now. 3.Diabetes diagnosed 2017: on metformin + insulin, followed by PCP.  4.Hypertensive urgency at presentation, BP much better, followed by PCP with [facility] 5.mammograms done 04-10-15, ok and breast tissue not dense, needs annual this fall. 6.intermittent low back pain: degenerative change seen on MRI L spine.   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 Doubt plantar fascitis due to location in feet,  despite history of worse when first stands in AM 10. Post partial thyroidectomy  11. DIfficult IV access  12.allergy to EMEND 13.Obesity: BMI 35. Getting more exercise at home and watching diet, still some hypoglycemia episodes making this more difficult. Encouraged attention to weight. 14.needs flu vaccine this fall 15. chemo and iron deficiency anemia resolved  All questions answered and she knows to call if concerns. Time spent 20 min including >50% counseling and coordination of care. Route PCP, Cc [name]    Evlyn Clines, MD   01/22/2016, 8:08 PM

## 2016-01-22 NOTE — Telephone Encounter (Signed)
appt made and avs printed. mammo scheduled for Nov 2017

## 2016-01-23 ENCOUNTER — Telehealth: Payer: Self-pay

## 2016-01-23 LAB — CA 125: CANCER ANTIGEN (CA) 125: 15.4 U/mL (ref 0.0–38.1)

## 2016-01-23 LAB — CANCER ANTIGEN 125 (PARALLEL TESTING): CA 125: 10 U/mL (ref <35)

## 2016-01-23 NOTE — Telephone Encounter (Signed)
-----   Message from Gordy Levan, MD sent at 01/23/2016  8:07 AM EDT ----- Labs seen and need follow up  Please let her know marker still in good low range

## 2016-01-23 NOTE — Telephone Encounter (Signed)
Told Sarah [name] the results of the CA-125 as noted below by [name]. Sarah [name] verbalized understanding.

## 2016-01-24 DIAGNOSIS — K439 Ventral hernia without obstruction or gangrene: Secondary | ICD-10-CM | POA: Insufficient documentation

## 2016-01-29 ENCOUNTER — Encounter: Payer: Self-pay | Admitting: Gynecologic Oncology

## 2016-01-29 ENCOUNTER — Ambulatory Visit: Payer: Medicaid Other | Attending: Gynecologic Oncology | Admitting: Gynecologic Oncology

## 2016-01-29 VITALS — BP 131/83 | HR 75 | Temp 98.4°F | Resp 18 | Ht 65.0 in | Wt 213.3 lb

## 2016-01-29 DIAGNOSIS — C482 Malignant neoplasm of peritoneum, unspecified: Secondary | ICD-10-CM | POA: Diagnosis not present

## 2016-01-29 DIAGNOSIS — Z794 Long term (current) use of insulin: Secondary | ICD-10-CM | POA: Insufficient documentation

## 2016-01-29 DIAGNOSIS — Z800 Family history of malignant neoplasm of digestive organs: Secondary | ICD-10-CM | POA: Diagnosis not present

## 2016-01-29 DIAGNOSIS — Z888 Allergy status to other drugs, medicaments and biological substances status: Secondary | ICD-10-CM | POA: Insufficient documentation

## 2016-01-29 DIAGNOSIS — E119 Type 2 diabetes mellitus without complications: Secondary | ICD-10-CM

## 2016-01-29 DIAGNOSIS — Z8249 Family history of ischemic heart disease and other diseases of the circulatory system: Secondary | ICD-10-CM | POA: Diagnosis not present

## 2016-01-29 DIAGNOSIS — Z808 Family history of malignant neoplasm of other organs or systems: Secondary | ICD-10-CM | POA: Diagnosis not present

## 2016-01-29 DIAGNOSIS — Z8543 Personal history of malignant neoplasm of ovary: Secondary | ICD-10-CM | POA: Diagnosis not present

## 2016-01-29 DIAGNOSIS — H409 Unspecified glaucoma: Secondary | ICD-10-CM | POA: Insufficient documentation

## 2016-01-29 DIAGNOSIS — R0602 Shortness of breath: Secondary | ICD-10-CM | POA: Diagnosis not present

## 2016-01-29 DIAGNOSIS — Z90722 Acquired absence of ovaries, bilateral: Secondary | ICD-10-CM | POA: Diagnosis not present

## 2016-01-29 DIAGNOSIS — Z90710 Acquired absence of both cervix and uterus: Secondary | ICD-10-CM | POA: Insufficient documentation

## 2016-01-29 DIAGNOSIS — K432 Incisional hernia without obstruction or gangrene: Secondary | ICD-10-CM | POA: Diagnosis not present

## 2016-01-29 DIAGNOSIS — Z833 Family history of diabetes mellitus: Secondary | ICD-10-CM | POA: Diagnosis not present

## 2016-01-29 DIAGNOSIS — Z8042 Family history of malignant neoplasm of prostate: Secondary | ICD-10-CM | POA: Insufficient documentation

## 2016-01-29 DIAGNOSIS — Z7984 Long term (current) use of oral hypoglycemic drugs: Secondary | ICD-10-CM | POA: Insufficient documentation

## 2016-01-29 DIAGNOSIS — R351 Nocturia: Secondary | ICD-10-CM | POA: Diagnosis not present

## 2016-01-29 DIAGNOSIS — Z9221 Personal history of antineoplastic chemotherapy: Secondary | ICD-10-CM | POA: Insufficient documentation

## 2016-01-29 DIAGNOSIS — I10 Essential (primary) hypertension: Secondary | ICD-10-CM | POA: Diagnosis not present

## 2016-01-29 NOTE — Patient Instructions (Signed)
Plan to follow up with [name] as scheduled and Dr. [name] in Feb 2018.  We will get you scheduled for Feb 2018 when you come and see [name].  Please call for any questions or concerns.

## 2016-01-29 NOTE — Progress Notes (Signed)
POST-TREATMENT FOLLOWUP. TREATMENT COUNSELING.  Assessment:   50 y.o. 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   S/p BSO, omentectomy, appendectomy on 07/28/14. S/p adjuvant chemotherapy with 6 cycles of paclitaxel and carboplatin completed on 12/08/14. S/p an ex lap, hernia repair and small bowel resection for a presumed recurrence (benign pathology).   No evidence of recurrent disease on exam.  Plan: 1) Recurrent ventral hernia: I discussed with the patient that clinically there is no evidence of incarceration. Treatment is elective. Given that the hernia is asymptomatic and the patient does not desire another surgery, it is reasonable to expectantly manage. 2) Treatment counseling -  I recommend continuing routine surveillance with visits every 3 months and CA 125 assessments (CT scans on a prn basis for symptoms or rise in CA 125). I will see her back in February 2018. 3) newly diagnosed type II DM- patient is on active therapy with insulin and oral medical therapy.  HPI:  Sarah [name] is a 50 y.o. year old initially seen in consultation on 07/08/14 for bilateral ovarian masses and ascites. This was diagnosed at the time of admission to the hospital for hypertensive crisis. This diagnosis of hypertension was new and she was newly started on a medical regimen for this. She then underwent an exploratory laparotomy, BSO, omentectomy, appendectomy on 07/28/14 with Dr Brewster without complications.  Her postoperative course was uncomplicated.  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 This represented a stage IIIc disease process.  She received 6 cycles of adjuvant chemotherapy with paclitaxel and carboplatin with Dr Livesay on 08/25/14 to 12/08/14.  Pretreatment CA 125 was 58 on 06/30/14, and was 20 on 12/22/14 (it's nadir had been 16 on 09/22/14).  Post treatment CT  of the chest, abdomen and pelvis on 01/12/15 revealed: Previously noted paratracheal, subcarinal and prevascular mediastinal lymphadenopathy has resolved, with no residual pathologically enlarged mediastinal lymph nodes. Resolved hilar lymphadenopathy, with no residual pathologically enlarged mediastinal nodes. The previously described 3 mm subpleural left lower lobe pulmonary nodule is decreased to 2 mm (series 4/image 41). A separate 2 mm subpleural left lower lobe pulmonary nodule (4/29) is slightly decreased from 3mm on 06/28/2014. A subpleural 3 mm right upper lobe pulmonary nodule (4/15) is unchanged from 06/28/2014. A 2 mm subpleural pulmonary nodule in the superior segment right lower lobe associated with the right major fissure (4/21) appears new. A 0.7 cm right posterior mesenteric node measures 0.7 cm short axis (series 2/image 93), decreased from 1.2 cm. No definite peritoneal tumor implants detected. A 0.4 cm nodule anterior/inferior to the spleen (series 2/ image 60) is mildly decreased from 0.7 cm. Small umbilical hernia containing a small bowel loop, with no evidence of small-bowel obstruction or strangulation.  She had a CA 125 on 03/27/15 that was normal and stable at 14.  A CT of the chest, abdomen and pelvis was performed on 04/27/15 which showed: No pulmonary mass, infiltrate, or effusion. Tiny less than 5 mm subpleural pulmonary nodules in the right upper lobe on image remain stable. No new or enlarging pulmonary nodules identified. Small left paraventral hernia containing bowel loops. New soft tissue nodularity is seen within the anterior abdominal mesenteric fat, with largest nodule measuring 10 x 17 mm. No other sites of peritoneal nodularity or soft tissue density seen.   On 07/06/15 she was taken to the OR for an ex lap, lysis of adhesions,   and small bowel resection with ventral hernia repair. FIndings were remarkable for substantial small bowel adhesions, hwoever the mass that had  been seen on imaging was not appreciated intraperitoneally. There was white nodularity and stranding on the mesentery of a segment of small bowel. It was completely resected with the small bowel resection. Pathology was benign.  Postoperatively she was noted to have very high blood glucose on acuchecks. An HbA1C was drawn on 07/07/15 and was very elevated at 10.1%. She was started on Insulin.   Interval Hx: She has been doing well with persistent neuropathy in her feet. She has developed a bulge in the upper abdomen which is nonpainful.  Her last CA 125 was 15 on 01/22/16  Current Outpatient Prescriptions on File Prior to Visit  Medication Sig Dispense Refill  . ACCU-CHEK SMARTVIEW test strip TID and a bedtime. E11.9 100 each 0  . acetaminophen (TYLENOL) 500 MG tablet Take 1,000 mg by mouth every 6 (six) hours as needed for mild pain or headache.    . acetaminophen-codeine (TYLENOL #3) 300-30 MG tablet Take 1 tablet by mouth every 4 (four) hours as needed. 60 tablet 0  . amLODipine (NORVASC) 10 MG tablet Take 1 tablet (10 mg total) by mouth daily. 90 tablet 3  . atorvastatin (LIPITOR) 20 MG tablet Take 1 tablet (20 mg total) by mouth daily. 90 tablet 3  . blood glucose meter kit and supplies Dispense based on patient and insurance preference. Use up to four times daily as directed. (FOR ICD-9 250.00, 250.01). 1 each 0  . glipiZIDE (GLUCOTROL) 5 MG tablet TAKE 1 TABLET BY MOUTH 2 TIMES DAILY BEFORE A MEAL. 60 tablet 3  . Insulin Glargine (LANTUS) 100 UNIT/ML Solostar Pen Inject 12 Units into the skin daily. 15 mL 11  . Insulin Pen Needle 31G X 5 MM MISC For use with lantus pen for injections once daily 50 each 10  . Lancets (ACCU-CHEK MULTICLIX) lancets   0  . latanoprost (XALATAN) 0.005 % ophthalmic solution Place 1 drop into both eyes at bedtime.    . lisinopril-hydrochlorothiazide (PRINZIDE,ZESTORETIC) 10-12.5 MG tablet Take 1 tablet by mouth daily. 90 tablet 3  . metFORMIN (GLUCOPHAGE XR) 500  MG 24 hr tablet Take 2 tablets (1,000 mg total) by mouth 2 (two) times daily with a meal. 120 tablet 3  . metoprolol tartrate (LOPRESSOR) 25 MG tablet TAKE 1/2 TABLET BY MOUTH 2 TIMES A DAY 30 tablet 2  . timolol (TIMOPTIC) 0.5 % ophthalmic solution Place 1 drop into both eyes daily.     No current facility-administered medications on file prior to visit.    Allergies  Allergen Reactions  . Emend [Aprepitant] Other (See Comments)    Urticaria    Past Medical History:  Diagnosis Date  . Arthritis   . Family history of colon cancer   . Glaucoma 02/16  . Glucagonoma 07/28/14   Pt denies this but states she has glaucoma  . History of chemotherapy   . Hypertension   . Imbalance   . Neuropathy (HCC)    feet bilat  . Nocturia   . Peritoneal carcinomatosis (HCC)    carcinoma of ovary   . Shortness of breath dyspnea    hx of 2013 - no problems currently   . Thyroid nodule    history of   . Wears glasses    Past Surgical History:  Procedure Laterality Date  . ABDOMINAL HYSTERECTOMY    . INCISIONAL HERNIA REPAIR N/A 07/06/2015     Procedure: INCISIONAL HERNIA REPAIR ;  Surgeon: Matthew Wakefield, MD;  Location: [facility];  Service: General;  Laterality: N/A;  . INSERTION OF MESH N/A 07/06/2015   Procedure: WITH INSERTION OF PHASIX ST MESH;  Surgeon: Matthew Wakefield, MD;  Location: [facility];  Service: General;  Laterality: N/A;  . LAPAROTOMY N/A 07/06/2015   Procedure: EXPLORATORY LAPAROTOMY;  Surgeon: Emma Rossi, MD;  Location: [facility];  Service: Gynecology;  Laterality: N/A;  . LYSIS OF ADHESION N/A 07/06/2015   Procedure:  LYSIS OF ADHESION RESECTION OF MESENTERIC MASS BOWEL RESECTION ;  Surgeon: Emma Rossi, MD;  Location: [facility];  Service: Gynecology;  Laterality: N/A;  . ROBOTIC ASSISTED TOTAL HYSTERECTOMY WITH BILATERAL SALPINGO OOPHERECTOMY Bilateral 07/28/2014   Procedure: ROBOTIC ASSISTED lysis of adhesions with biopsies, converted to LAPAROTOMY, bilateral salpingoorphorectomy,  omentectomy,appendectomy;  Surgeon: Wendy Brewster, MD;  Location: [facility];  Service: Gynecology;  Laterality: Bilateral;  . THYROIDECTOMY, PARTIAL     Family History  Problem Relation Age of Onset  . Hypertension Mother   . Hypertension Father   . Diabetes Father   . Cancer Sister 50    fibrosarcoma (back); currently 55  . Prostate cancer Maternal Uncle   . Colon cancer Paternal Aunt     Dx 70s; deceased 70s  . Prostate cancer Paternal Uncle     currently 75  . Cancer Paternal Uncle 81    "bone" ; unk. primary  . Stomach cancer Paternal Uncle    Social History   Social History  . Marital status: Married    Spouse name: N/A  . Number of children: N/A  . Years of education: N/A   Occupational History  . Not on file.   Social History Main Topics  . Smoking status: Never Smoker  . Smokeless tobacco: Never Used  . Alcohol use No  . Drug use: No  . Sexual activity: Not on file   Other Topics Concern  . Not on file   Social History Narrative  . No narrative on file    Review of systems: Constitutional:  She has no weight gain or weight loss. She has no fever or chills. Eyes: No blurred vision Ears, Nose, Mouth, Throat: No dizziness, headaches or changes in hearing. No mouth sores. Cardiovascular: No chest pain, palpitations or edema. Respiratory:  No shortness of breath, wheezing or cough Gastrointestinal: She has normal bowel movements without diarrhea or constipation. She denies any nausea or vomiting. She denies blood in her stool or heart burn. Abdominal pain Genitourinary:  She denies pelvic pain, pelvic pressure or changes in her urinary function. She has no hematuria, dysuria, or incontinence. She has no irregular vaginal bleeding or vaginal discharge Musculoskeletal: Denies muscle weakness or joint pains.  Skin:  She has no skin changes, rashes or itching Neurological:  Denies dizziness or headaches. + bilateral foot neuropathy Psychiatric:  She denies  depression or anxiety. Hematologic/Lymphatic:   No easy bruising or bleeding   Physical Exam: Blood pressure 131/83, pulse 75, temperature 98.4 F (36.9 C), temperature source Oral, resp. rate 18, height 5' 5" (1.651 m), weight 213 lb 4.8 oz (96.8 kg), SpO2 100 %. General: Well dressed, well nourished in no apparent distress.   HEENT:  Normocephalic and atraumatic, no lesions.  Extraocular muscles intact. Sclerae anicteric. Pupils equal, round, reactive. No mouth sores or ulcers. Thyroid is normal size, not nodular, midline. Abdomen:  Soft, nontender, nondistended.  No palpable masses.  No hepatosplenomegaly.  No ascites. Normal bowel sounds. Incision is   healed. 10cm soft, reducible midline upper abdominal ventral hernia. No erythema. Not tender to palpate. Genitourinary: No pelvic masses. Extremities: No cyanosis, clubbing or edema.  No calf tenderness or erythema. No palpable cords. Psychiatric: Mood and affect are appropriate. Neurological: Awake, alert and oriented x 3. Sensation is intact, no neuropathy.  Musculoskeletal: No pain, normal strength and range of motion.  Donaciano Eva, MD

## 2016-02-07 IMAGING — CT CT ABD-PELV W/ CM
2 of 5 series · 16 of 46 positions shown, 18 images · IV contrast (OMNIPAQUE)
Comparison: 01/12/2015

CLINICAL DATA: Followup metastatic ovarian carcinoma. Previous
surgery and chemotherapy.

EXAM:
CT CHEST, ABDOMEN, AND PELVIS WITH CONTRAST
TECHNIQUE: Multidetector CT imaging of the chest, abdomen and pelvis was
performed following the standard protocol during bolus
administration of intravenous contrast.
CONTRAST:  100mL OMNIPAQUE IOHEXOL 300 MG/ML  SOLN

[Series 2: cap with st · axial · 0.81mm/px · z∈[-661,-96]mm · 13 of 129 slices shown, 15 images]
[im 8/129  soft-tissue]
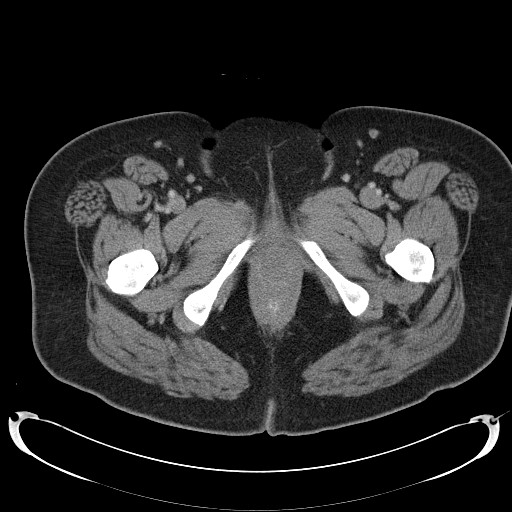
[im 8/129  bone]
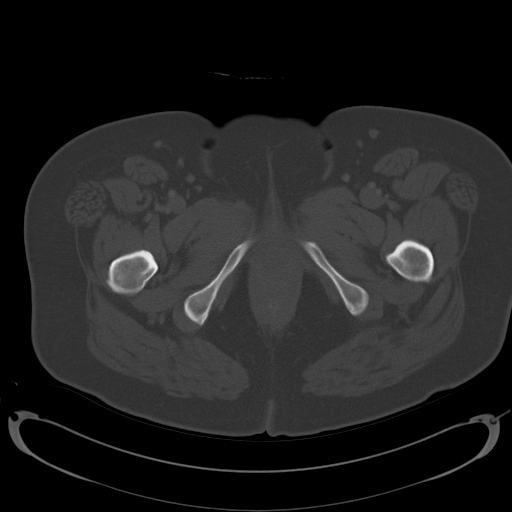
[im 16/129  soft-tissue]
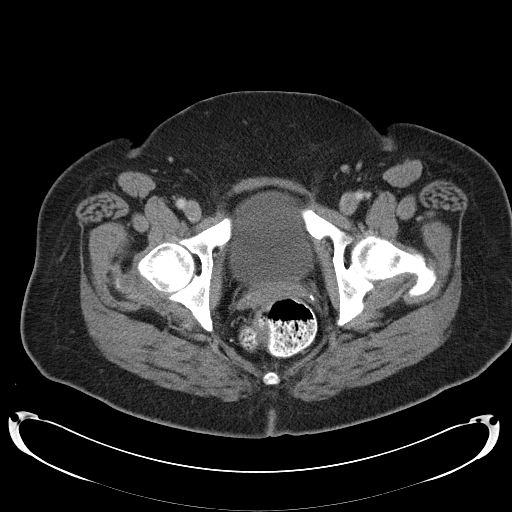
[im 31/129  soft-tissue]
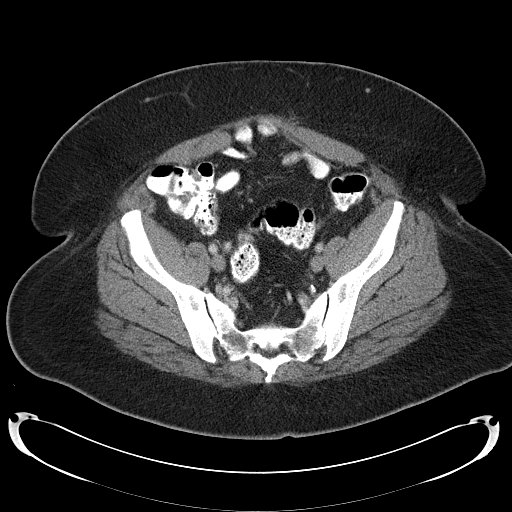
[im 38/129  soft-tissue]
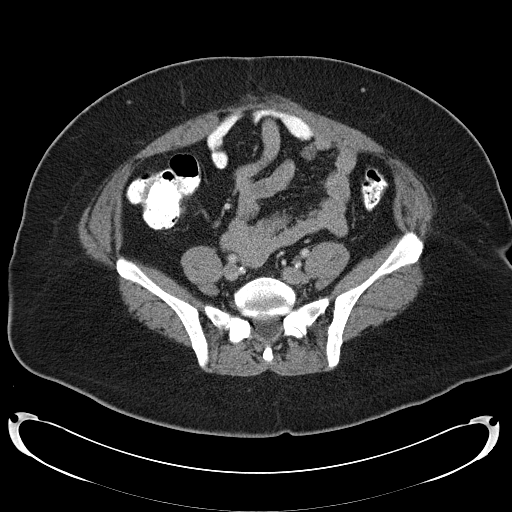
[im 46/129  soft-tissue]
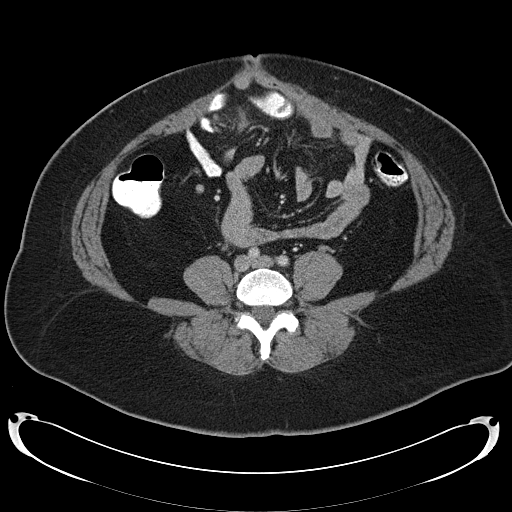
[im 53/129  soft-tissue]
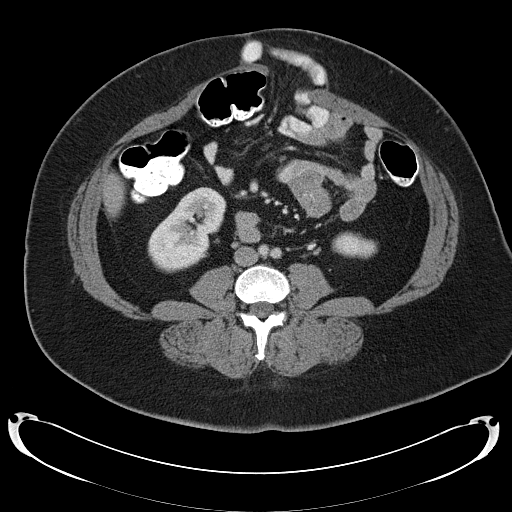
[im 68/129  soft-tissue]
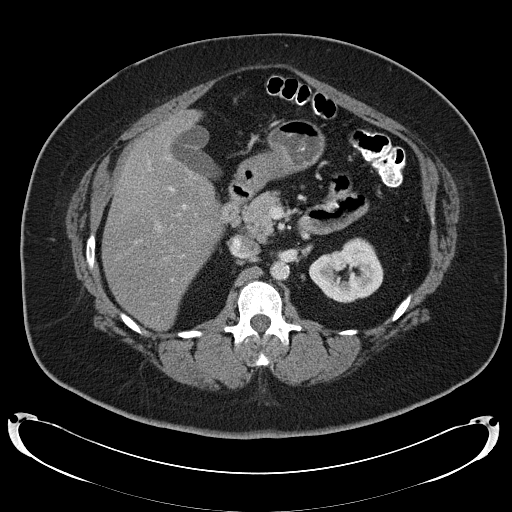
[im 76/129  soft-tissue]
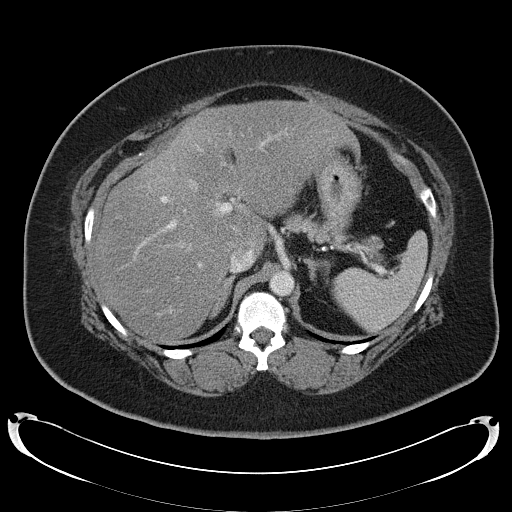
[im 83/129  soft-tissue]
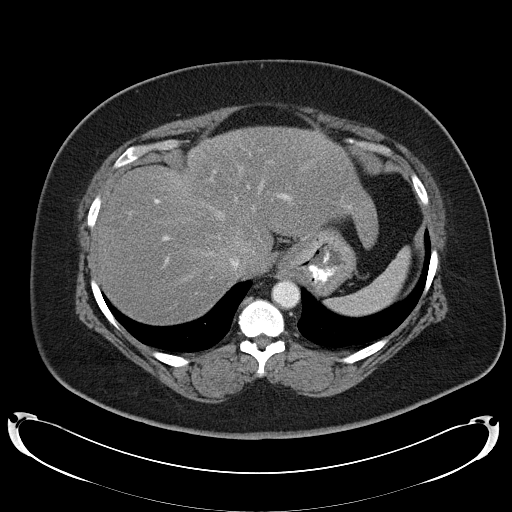
[im 83/129  bone]
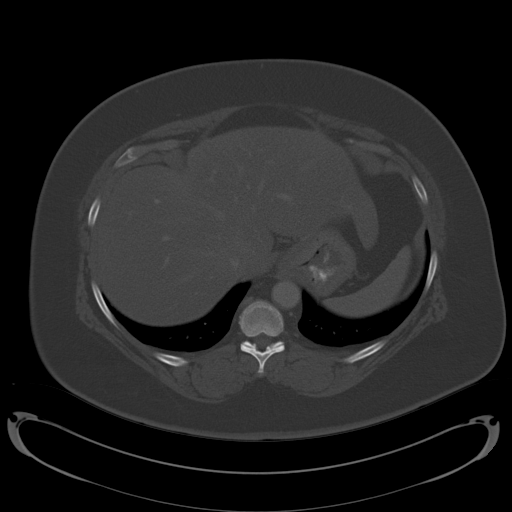
[im 91/129  soft-tissue]
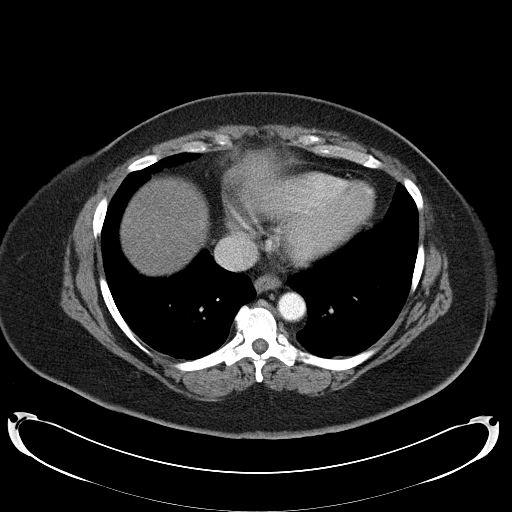
[im 98/129  soft-tissue]
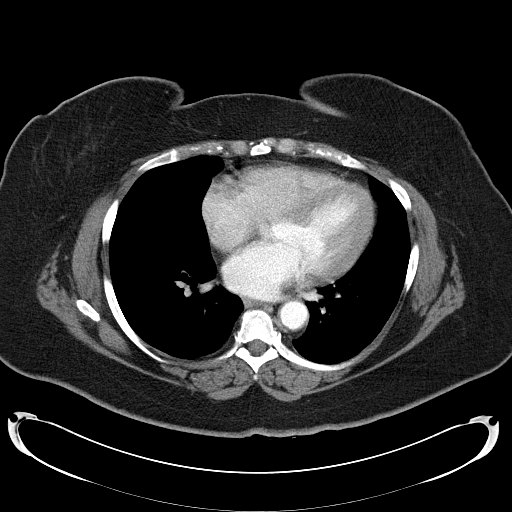
[im 113/129  soft-tissue]
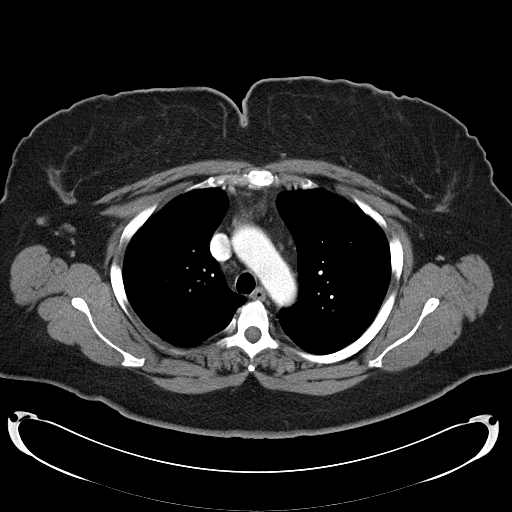
[im 121/129  soft-tissue]
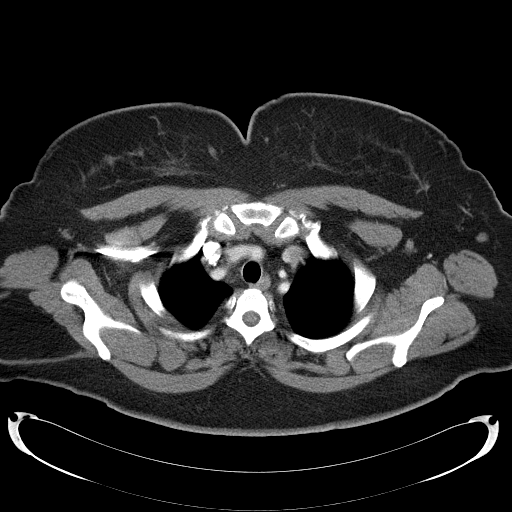

[Series 602: <mpr thick range> · coronal · 1.26mm/px · 3 of 116 slices shown]
[im 39/116  soft-tissue]
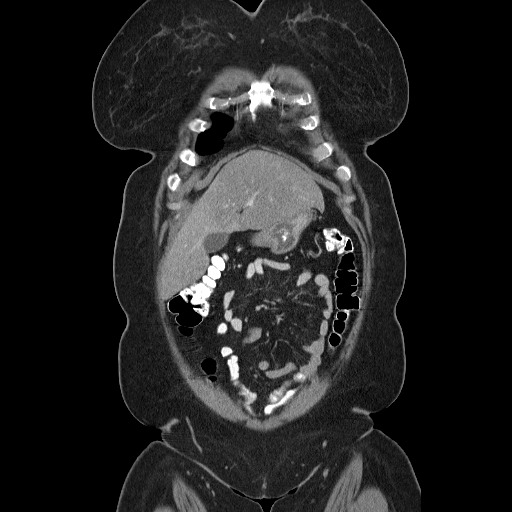
[im 52/116  soft-tissue]
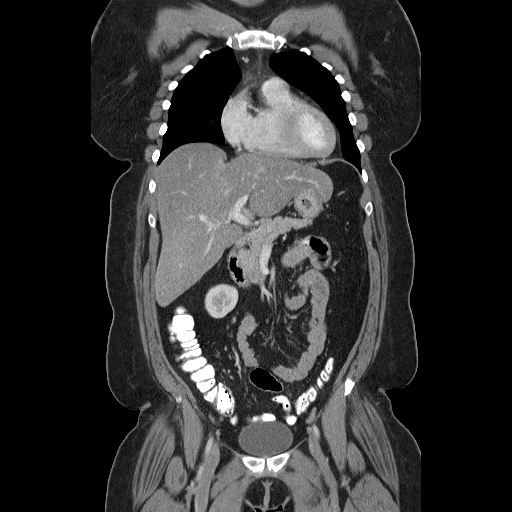
[im 64/116  soft-tissue]
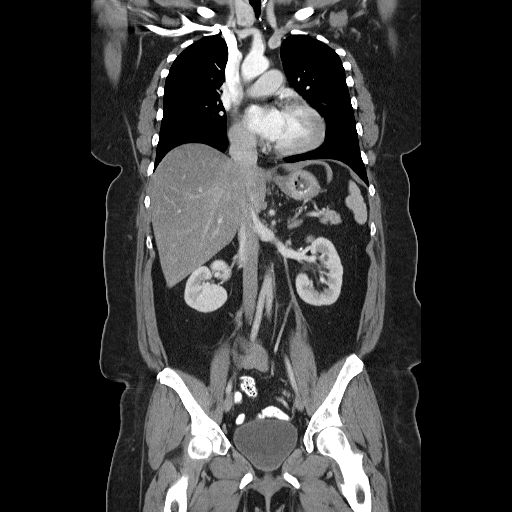

[16 of 46 positions shown; findings below may reference images not displayed]

FINDINGS: CT CHEST FINDINGS

Mediastinum/Lymph Nodes: No masses, pathologically enlarged lymph
nodes, or other significant abnormality. Prior left thyroidectomy
again noted. Normal heart size. No evidence of pericardial effusion.

Lungs/Pleura: No pulmonary mass, infiltrate, or effusion. Tiny less
than 5 mm subpleural pulmonary nodules in the right upper lobe on
image 15 and left lower lobe on image 41 remain stable. No new or
enlarging pulmonary nodules identified.

Musculoskeletal: No chest wall mass or suspicious bone lesions
identified.

CT ABDOMEN PELVIS FINDINGS

Hepatobiliary: Diffuse hepatic steatosis demonstrated. No liver
masses identified. Gallbladder is unremarkable.

Pancreas: No mass, inflammatory changes, or other significant
abnormality.

Spleen: Within normal limits in size and appearance.

Adrenals/Urinary Tract: No masses identified. No evidence of
hydronephrosis.

Stomach/Bowel: No evidence of obstruction, inflammatory process, or
abnormal fluid collections. A small left paraumbilical ventral
hernia is seen containing a loop of small bowel. No evidence of
small bowel obstruction or ischemia.

Vascular/Lymphatic: No pathologically enlarged lymph nodes. No
evidence of abdominal aortic aneursym.

Reproductive: Prior hysterectomy noted. Adnexal regions are
unremarkable in appearance.

Other: New soft tissue nodularity is seen within the anterior
abdominal mesenteric fat, with largest nodule measuring 10 x 17 mm
on image 83/ series 2. No other sites of peritoneal nodularity or
soft tissue density seen. No evidence of ascites.

Musculoskeletal:  No suspicious bone lesions identified.
IMPRESSION: New area of soft tissue nodularity in the anterior abdominal
mesenteric fat. Peritoneal carcinoma cannot be excluded. No other
sites of recurrent or metastatic carcinoma identified.

Stable tiny bilateral pulmonary nodules. Recommend continued
attention on follow-up imaging.

Diffuse hepatic steatosis.

Small left paraumbilical hernia containing small bowel. No evidence
of small bowel obstruction or ischemia.

## 2016-02-08 ENCOUNTER — Telehealth: Payer: Self-pay | Admitting: Internal Medicine

## 2016-02-08 ENCOUNTER — Other Ambulatory Visit: Payer: Self-pay | Admitting: Internal Medicine

## 2016-02-08 MED ORDER — METOPROLOL TARTRATE 25 MG PO TABS: ORAL_TABLET | ORAL | 2 refills | Status: DC

## 2016-02-08 MED ORDER — ACCU-CHEK SMARTVIEW VI STRP: ORAL_STRIP | 12 refills | Status: DC

## 2016-02-08 MED FILL — ACCU-CHEK SMARTVIEW STRIP: 30 days supply | Qty: 100 | Fill #0

## 2016-02-08 MED FILL — LANTUS SOLOSTAR 100 UNITS/M: 100 | 25 days supply | Qty: 3 | Fill #3

## 2016-02-08 MED FILL — ATORVASTATIN 20 MG TABLET: 20 | 30 days supply | Qty: 30 | Fill #6

## 2016-02-08 MED FILL — AMLODIPINE BESYLATE 10 MG T: 10 | 30 days supply | Qty: 30 | Fill #6

## 2016-02-08 MED FILL — LISINOPRIL-HCTZ 10-12.5 MG: 10-12.5 | 30 days supply | Qty: 30 | Fill #6

## 2016-02-08 MED FILL — METOPROLOL TARTRATE 25 MG T: 25 | 30 days supply | Qty: 30 | Fill #0

## 2016-02-08 NOTE — Telephone Encounter (Signed)
Requested medications refilled 

## 2016-02-08 NOTE — Telephone Encounter (Signed)
Pt. Called requesting a refill on the following medications:  metoprolol tartrate (LOPRESSOR) 25 MG tablet  Test strips  Please f/u

## 2016-02-19 ENCOUNTER — Other Ambulatory Visit: Payer: Self-pay | Admitting: Internal Medicine

## 2016-02-19 MED FILL — glipiZIDE 5 MG TABS: 5 | 30 days supply | Qty: 60 | Fill #2

## 2016-02-20 MED FILL — METFORMIN HCL ER 500 MG TAB: 500 | 30 days supply | Qty: 120 | Fill #0

## 2016-02-22 ENCOUNTER — Encounter: Payer: Self-pay | Admitting: Internal Medicine

## 2016-02-22 ENCOUNTER — Ambulatory Visit: Payer: Medicaid Other | Attending: Internal Medicine | Admitting: Internal Medicine

## 2016-02-22 VITALS — BP 127/82 | HR 67 | Temp 98.3°F | Resp 18 | Ht 65.0 in | Wt 214.0 lb

## 2016-02-22 DIAGNOSIS — Z794 Long term (current) use of insulin: Secondary | ICD-10-CM

## 2016-02-22 DIAGNOSIS — G5793 Unspecified mononeuropathy of bilateral lower limbs: Secondary | ICD-10-CM

## 2016-02-22 DIAGNOSIS — G5792 Unspecified mononeuropathy of left lower limb: Secondary | ICD-10-CM

## 2016-02-22 DIAGNOSIS — G5791 Unspecified mononeuropathy of right lower limb: Secondary | ICD-10-CM | POA: Diagnosis not present

## 2016-02-22 DIAGNOSIS — E119 Type 2 diabetes mellitus without complications: Secondary | ICD-10-CM | POA: Diagnosis not present

## 2016-02-22 DIAGNOSIS — I10 Essential (primary) hypertension: Secondary | ICD-10-CM | POA: Diagnosis not present

## 2016-02-22 LAB — GLUCOSE, POCT (MANUAL RESULT ENTRY): POC Glucose: 55 mg/dl — AB (ref 70–99)

## 2016-02-22 LAB — POCT GLYCOSYLATED HEMOGLOBIN (HGB A1C): Hemoglobin A1C: 5.7

## 2016-02-22 NOTE — Patient Instructions (Signed)
Diabetes and Exercise Exercising regularly is important. It is not just about losing weight. It has many health benefits, such as:  Improving your overall fitness, flexibility, and endurance.  Increasing your bone density.  Helping with weight control.  Decreasing your body fat.  Increasing your muscle strength.  Reducing stress and tension.  Improving your overall health. People with diabetes who exercise gain additional benefits because exercise:  Reduces appetite.  Improves the body's use of blood sugar (glucose).  Helps lower or control blood glucose.  Decreases blood pressure.  Helps control blood lipids (such as cholesterol and triglycerides).  Improves the body's use of the hormone insulin by:  Increasing the body's insulin sensitivity.  Reducing the body's insulin needs.  Decreases the risk for heart disease because exercising:  Lowers cholesterol and triglycerides levels.  Increases the levels of good cholesterol (such as high-density lipoproteins [HDL]) in the body.  Lowers blood glucose levels. YOUR ACTIVITY PLAN  Choose an activity that you enjoy, and set realistic goals. To exercise safely, you should begin practicing any new physical activity slowly, and gradually increase the intensity of the exercise over time. Your health care provider or diabetes educator can help create an activity plan that works for you. General recommendations include:  Encouraging children to engage in at least 60 minutes of physical activity each day.  Stretching and performing strength training exercises, such as yoga or weight lifting, at least 2 times per week.  Performing a total of at least 150 minutes of moderate-intensity exercise each week, such as brisk walking or water aerobics.  Exercising at least 3 days per week, making sure you allow no more than 2 consecutive days to pass without exercising.  Avoiding long periods of inactivity (90 minutes or more). When you  have to spend an extended period of time sitting down, take frequent breaks to walk or stretch. RECOMMENDATIONS FOR EXERCISING WITH TYPE 1 OR TYPE 2 DIABETES   Check your blood glucose before exercising. If blood glucose levels are greater than 240 mg/dL, check for urine ketones. Do not exercise if ketones are present.  Avoid injecting insulin into areas of the body that are going to be exercised. For example, avoid injecting insulin into:  The arms when playing tennis.  The legs when jogging.  Keep a record of:  Food intake before and after you exercise.  Expected peak times of insulin action.  Blood glucose levels before and after you exercise.  The type and amount of exercise you have done.  Review your records with your health care provider. Your health care provider will help you to develop guidelines for adjusting food intake and insulin amounts before and after exercising.  If you take insulin or oral hypoglycemic agents, watch for signs and symptoms of hypoglycemia. They include:  Dizziness.  Shaking.  Sweating.  Chills.  Confusion.  Drink plenty of water while you exercise to prevent dehydration or heat stroke. Body water is lost during exercise and must be replaced.  Talk to your health care provider before starting an exercise program to make sure it is safe for you. Remember, almost any type of activity is better than none.   This information is not intended to replace advice given to you by your health care provider. Make sure you discuss any questions you have with your health care provider.   Document Released: 08/17/2003 Document Revised: 10/11/2014 Document Reviewed: 11/03/2012 Elsevier Interactive Patient Education 2016 [place]. Diabetes and Foot Care Diabetes may cause you  to have problems because of poor blood supply (circulation) to your feet and legs. This may cause the skin on your feet to become thinner, break easier, and heal more slowly.  Your skin may become dry, and the skin may peel and crack. You may also have nerve damage in your legs and feet causing decreased feeling in them. You may not notice minor injuries to your feet that could lead to infections or more serious problems. Taking care of your feet is one of the most important things you can do for yourself.  HOME CARE INSTRUCTIONS  Wear shoes at all times, even in the house. Do not go barefoot. Bare feet are easily injured.  Check your feet daily for blisters, cuts, and redness. If you cannot see the bottom of your feet, use a mirror or ask someone for help.  Wash your feet with warm water (do not use hot water) and mild soap. Then pat your feet and the areas between your toes until they are completely dry. Do not soak your feet as this can dry your skin.  Apply a moisturizing lotion or petroleum jelly (that does not contain alcohol and is unscented) to the skin on your feet and to dry, brittle toenails. Do not apply lotion between your toes.  Trim your toenails straight across. Do not dig under them or around the cuticle. File the edges of your nails with an emery board or nail file.  Do not cut corns or calluses or try to remove them with medicine.  Wear clean socks or stockings every day. Make sure they are not too tight. Do not wear knee-high stockings since they may decrease blood flow to your legs.  Wear shoes that fit properly and have enough cushioning. To break in new shoes, wear them for just a few hours a day. This prevents you from injuring your feet. Always look in your shoes before you put them on to be sure there are no objects inside.  Do not cross your legs. This may decrease the blood flow to your feet.  If you find a minor scrape, cut, or break in the skin on your feet, keep it and the skin around it clean and dry. These areas may be cleansed with mild soap and water. Do not cleanse the area with peroxide, alcohol, or iodine.  When you remove an  adhesive bandage, be sure not to damage the skin around it.  If you have a wound, look at it several times a day to make sure it is healing.  Do not use heating pads or hot water bottles. They may burn your skin. If you have lost feeling in your feet or legs, you may not know it is happening until it is too late.  Make sure your health care provider performs a complete foot exam at least annually or more often if you have foot problems. Report any cuts, sores, or bruises to your health care provider immediately. SEEK MEDICAL CARE IF:   You have an injury that is not healing.  You have cuts or breaks in the skin.  You have an ingrown nail.  You notice redness on your legs or feet.  You feel burning or tingling in your legs or feet.  You have pain or cramps in your legs and feet.  Your legs or feet are numb.  Your feet always feel cold. SEEK IMMEDIATE MEDICAL CARE IF:   There is increasing redness, swelling, or pain in or around a   wound.  There is a red line that goes up your leg.  Pus is coming from a wound.  You develop a fever or as directed by your health care provider.  You notice a bad smell coming from an ulcer or wound.   This information is not intended to replace advice given to you by your health care provider. Make sure you discuss any questions you have with your health care provider.   Document Released: 05/24/2000 Document Revised: 01/27/2013 Document Reviewed: 11/03/2012 Elsevier Interactive Patient Education 2016 [employer] Basic Carbohydrate Counting for Diabetes Mellitus Carbohydrate counting is a method for keeping track of the amount of carbohydrates you eat. Eating carbohydrates naturally increases the level of sugar (glucose) in your blood, so it is important for you to know the amount that is okay for you to have in every meal. Carbohydrate counting helps keep the level of glucose in your blood within normal limits. The amount of carbohydrates  allowed is different for every person. A dietitian can help you calculate the amount that is right for you. Once you know the amount of carbohydrates you can have, you can count the carbohydrates in the foods you want to eat. Carbohydrates are found in the following foods:  Grains, such as breads and cereals.  Dried beans and soy products.  Starchy vegetables, such as potatoes, peas, and corn.  Fruit and fruit juices.  Milk and yogurt.  Sweets and snack foods, such as cake, cookies, candy, chips, soft drinks, and fruit drinks. CARBOHYDRATE COUNTING There are two ways to count the carbohydrates in your food. You can use either of the methods or a combination of both. Reading the "Nutrition Facts" on Packaged Food The "Nutrition Facts" is an area that is included on the labels of almost all packaged food and beverages in the [place]. It includes the serving size of that food or beverage and information about the nutrients in each serving of the food, including the grams (g) of carbohydrate per serving.  Decide the number of servings of this food or beverage that you will be able to eat or drink. Multiply that number of servings by the number of grams of carbohydrate that is listed on the label for that serving. The total will be the amount of carbohydrates you will be having when you eat or drink this food or beverage. Learning Standard Serving Sizes of Food When you eat food that is not packaged or does not include "Nutrition Facts" on the label, you need to measure the servings in order to count the amount of carbohydrates.A serving of most carbohydrate-rich foods contains about 15 g of carbohydrates. The following list includes serving sizes of carbohydrate-rich foods that provide 15 g ofcarbohydrate per serving:   1 slice of bread (1 oz) or 1 six-inch tortilla.    of a hamburger bun or English muffin.  4-6 crackers.   cup unsweetened dry cereal.    cup hot cereal.    cup rice or pasta.    cup mashed potatoes or  of a large baked potato.  1 cup fresh fruit or one small piece of fruit.    cup canned or frozen fruit or fruit juice.  1 cup milk.   cup plain fat-free yogurt or yogurt sweetened with artificial sweeteners.   cup cooked dried beans or starchy vegetable, such as peas, corn, or potatoes.  Decide the number of standard-size servings that you will eat. Multiply that number of servings by 15 (the grams   of carbohydrates in that serving). For example, if you eat 2 cups of strawberries, you will have eaten 2 servings and 30 g of carbohydrates (2 servings x 15 g = 30 g). For foods such as soups and casseroles, in which more than one food is mixed in, you will need to count the carbohydrates in each food that is included. EXAMPLE OF CARBOHYDRATE COUNTING Sample Dinner  3 oz chicken breast.   cup of brown rice.   cup of corn.  1 cup milk.   1 cup strawberries with sugar-free whipped topping.  Carbohydrate Calculation Step 1: Identify the foods that contain carbohydrates:   Rice.   Corn.   Milk.   Strawberries. Step 2:Calculate the number of servings eaten of each:   2 servings of rice.   1 serving of corn.   1 serving of milk.   1 serving of strawberries. Step 3: Multiply each of those number of servings by 15 g:   2 servings of rice x 15 g = 30 g.   1 serving of corn x 15 g = 15 g.   1 serving of milk x 15 g = 15 g.   1 serving of strawberries x 15 g = 15 g. Step 4: Add together all of the amounts to find the total grams of carbohydrates eaten: 30 g + 15 g + 15 g + 15 g = 75 g.   This information is not intended to replace advice given to you by your health care provider. Make sure you discuss any questions you have with your health care provider.   Document Released: 05/27/2005 Document Revised: 06/17/2014 Document Reviewed: 04/23/2013 Elsevier Interactive Patient Education 2016 [employer] Inc[employer]  

## 2016-02-22 NOTE — Progress Notes (Signed)
Patient is here for DM  Patient denies pain at this time.  Patient has taken medication today. Patient has eaten today.  Patient was given a sprite zero due to sugar level being 55. Patient declined snack and stated she was eating once she left the OV.

## 2016-02-22 NOTE — Progress Notes (Signed)
Sarah [name], is a 50 y.o. female  MPN:[id number]  MGQ:[id number]  DOB - 03/20/1966  Chief Complaint  Patient presents with  . Diabetes      Subjective:   Sarah [name] is a 50 y.o. 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 S/p adjuvant chemotherapy with 6 cycles of paclitaxel and carboplatin completed on 12/08/14. S/p an ex lap, hernia repair and small bowel resection for a presumed recurrence (benign pathology) here today for a follow up visit of HTN and DM. She is doing really well, has no complaint today. Although recently she has started to notice low blood sugar and had not being using her insulin regularly. She had also skipped glipizide sometimes because of low blood sugar. She denies any depression or suicidal ideation. Patient has No headache, No chest pain, No abdominal pain - No Nausea, No new weakness tingling or numbness, No Cough - SOB. BP is controlled, BS is low in the office today, patient given pepsi cola, although patient was completely asymptomatic.  No problems updated.  ALLERGIES: Allergies  Allergen Reactions  . Emend [Aprepitant] Other (See Comments)    Urticaria     PAST MEDICAL HISTORY: Past Medical History:  Diagnosis Date  . Arthritis   . Family history of colon cancer   . Glaucoma 02/16  . Glucagonoma 07/28/14   Pt denies this but states she has glaucoma  . History of chemotherapy   . Hypertension   . Imbalance   . Neuropathy (HCC)    feet bilat  . Nocturia   . Peritoneal carcinomatosis ([place])    carcinoma of ovary   . Shortness of breath dyspnea    hx of 2013 - no problems currently   . Thyroid nodule    history of   . Wears glasses     MEDICATIONS AT HOME: Prior to Admission medications   Medication Sig Start Date End Date Taking? Authorizing Provider  ACCU-CHEK SMARTVIEW test strip TID and a bedtime. E11.9 02/08/16   Yes Tresa Garter, MD  acetaminophen-codeine (TYLENOL #3) 300-30 MG tablet Take 1 tablet by mouth every 4 (four) hours as needed. 10/26/15  Yes Tresa Garter, MD  amLODipine (NORVASC) 10 MG tablet Take 1 tablet (10 mg total) by mouth daily. 07/27/15  Yes Tresa Garter, MD  atorvastatin (LIPITOR) 20 MG tablet Take 1 tablet (20 mg total) by mouth daily. 07/27/15  Yes Tresa Garter, MD  blood glucose meter kit and supplies Dispense based on patient and insurance preference. Use up to four times daily as directed. (FOR ICD-9 250.00, 250.01). 07/12/15  Yes Melissa D Cross, NP  Insulin Pen Needle 31G X 5 MM MISC For use with lantus pen for injections once daily 07/12/15  Yes Dorothyann Gibbs, NP  Lancets (ACCU-CHEK MULTICLIX) lancets  07/12/15  Yes Historical Provider, MD  latanoprost (XALATAN) 0.005 % ophthalmic solution Place 1 drop into both eyes at bedtime.   Yes Historical Provider, MD  lisinopril-hydrochlorothiazide (PRINZIDE,ZESTORETIC) 10-12.5 MG tablet Take 1 tablet by mouth daily. 07/27/15  Yes Tresa Garter, MD  metFORMIN (GLUCOPHAGE-XR) 500 MG 24 hr tablet TAKE 2 TABLETS BY MOUTH 2 TIMES DAILY WITH A MEAL. 02/19/16  Yes Tresa Garter, MD  metoprolol tartrate (LOPRESSOR) 25 MG tablet TAKE 1/2 TABLET BY MOUTH 2 TIMES A DAY 02/08/16  Yes Kelyn Koskela E Rokia Bosket, MD  timolol (TIMOPTIC) 0.5 % ophthalmic solution Place 1  drop into both eyes daily.   Yes Historical Provider, MD     Objective:   Vitals:   02/22/16 1518  BP: 127/82  Pulse: 67  Resp: 18  Temp: 98.3 F (36.8 C)  TempSrc: Oral  SpO2: 98%  Weight: 214 lb (97.1 kg)  Height: 5' 5"  (9.811 m)   Exam General appearance : Awake, alert, not in any distress. Speech Clear. Not toxic looking HEENT: Atraumatic and Normocephalic, pupils equally reactive to light and accomodation Neck: Supple, no JVD. No cervical lymphadenopathy.  Chest: Good air entry bilaterally, no added sounds  CVS: S1 S2 regular, no murmurs.    Abdomen: Bowel sounds present, Non tender and not distended with no gaurding, rigidity or rebound. Extremities: B/L Lower Ext shows no edema, both legs are warm to touch Neurology: Awake alert, and oriented X 3, CN II-XII intact, Non focal Skin: No Rash  Data Review Lab Results  Component Value Date   HGBA1C 5.7 02/22/2016   HGBA1C 6.4 (H) 10/26/2015   HGBA1C 10.1 (H) 07/07/2015   Assessment & Plan   1. Type 2 diabetes mellitus without complication, with long-term current use of insulin (HCC)  - POCT A1C - Glucose (CBG) - Microalbumin/Creatinine Ratio, Urine - Discontinue Insulin - Discontinue Glipizide - Continue Metformin - Check BS at least once daily - If A1C goes up by next visit, will increase Metformin to 1000 mg BiD  Aim for 30 minutes of exercise most days. Rethink what you drink. Water is great! Aim for 2-3 Carb Choices per meal (30-45 grams) +/- 1 either way  Aim for 0-15 Carbs per snack if hungry  Include protein in moderation with your meals and snacks  Consider reading food labels for Total Carbohydrate and Fat Grams of foods  Consider checking BG at alternate times per day  Continue taking medication as directed Be mindful about how much sugar you are adding to beverages and other foods. Fruit Punch - find one with no sugar  Measure and decrease portions of carbohydrate foods  Make your plate and don't go back for seconds  2. Essential hypertension  We have discussed target BP range and blood pressure goal. I have advised patient to check BP regularly and to call us back or report to clinic if the numbers are consistently higher than 140/90. We discussed the importance of compliance with medical therapy and DASH diet recommended, consequences of uncontrolled hypertension discussed.  - continue current BP medications  3. Neuropathic pain of both legs  Continue Gabapentin  Patient have been counseled extensively about nutrition and exercise  Return in  about 6 months (around 08/21/2016) for Hemoglobin A1C and Follow up, DM, Follow up HTN.  The patient was given clear instructions to go to ER or return to medical center if symptoms don't improve, worsen or new problems develop. The patient verbalized understanding. The patient was told to call to get lab results if they haven't heard anything in the next week.   This note has been created with Surveyor, quantity. Any transcriptional errors are unintentional.   Angelica Chessman, MD, [place], Karilyn Cota, [place] and [place], [place]   02/22/2016, 3:43 PM

## 2016-03-11 MED FILL — LISINOPRIL-HCTZ 10-12.5 MG: 10-12.5 | 30 days supply | Qty: 30 | Fill #7

## 2016-03-11 MED FILL — ATORVASTATIN 20 MG TABLET: 20 | 30 days supply | Qty: 30 | Fill #7

## 2016-03-11 MED FILL — AMLODIPINE BESYLATE 10 MG T: 10 | 30 days supply | Qty: 30 | Fill #7

## 2016-03-11 MED FILL — ACCU-CHEK SMARTVIEW STRIP: 30 days supply | Qty: 100 | Fill #1

## 2016-03-11 MED FILL — METOPROLOL TARTRATE 25 MG T: 25 | 30 days supply | Qty: 30 | Fill #1

## 2016-03-25 MED FILL — METFORMIN HCL ER 500 MG TAB: 500 | 30 days supply | Qty: 120 | Fill #1

## 2016-04-08 ENCOUNTER — Ambulatory Visit
Admission: RE | Admit: 2016-04-08 | Discharge: 2016-04-08 | Disposition: A | Discharge status: Home / Self Care | Payer: Medicaid Other | Source: Ambulatory Visit | Attending: Oncology | Admitting: Oncology

## 2016-04-08 DIAGNOSIS — Z1239 Encounter for other screening for malignant neoplasm of breast: Secondary | ICD-10-CM

## 2016-04-10 MED FILL — LISINOPRIL-HCTZ 10-12.5 MG: 10-12.5 | 30 days supply | Qty: 30 | Fill #8

## 2016-04-10 MED FILL — METOPROLOL TARTRATE 25 MG T: 25 | 30 days supply | Qty: 30 | Fill #2

## 2016-04-10 MED FILL — ATORVASTATIN 20 MG TABLET: 20 | 30 days supply | Qty: 30 | Fill #8

## 2016-04-10 MED FILL — AMLODIPINE BESYLATE 10 MG T: 10 | 30 days supply | Qty: 30 | Fill #8

## 2016-04-21 ENCOUNTER — Other Ambulatory Visit: Payer: Self-pay | Admitting: Oncology

## 2016-04-21 DIAGNOSIS — C482 Malignant neoplasm of peritoneum, unspecified: Secondary | ICD-10-CM

## 2016-04-22 ENCOUNTER — Other Ambulatory Visit (HOSPITAL_BASED_OUTPATIENT_CLINIC_OR_DEPARTMENT_OTHER): Payer: Medicaid Other

## 2016-04-22 ENCOUNTER — Ambulatory Visit (HOSPITAL_BASED_OUTPATIENT_CLINIC_OR_DEPARTMENT_OTHER): Payer: Medicaid Other | Admitting: Oncology

## 2016-04-22 VITALS — BP 124/78 | HR 66 | Temp 98.4°F | Resp 14 | Ht 65.0 in | Wt 216.4 lb

## 2016-04-22 DIAGNOSIS — C482 Malignant neoplasm of peritoneum, unspecified: Secondary | ICD-10-CM | POA: Diagnosis not present

## 2016-04-22 DIAGNOSIS — K432 Incisional hernia without obstruction or gangrene: Secondary | ICD-10-CM

## 2016-04-22 DIAGNOSIS — G5793 Unspecified mononeuropathy of bilateral lower limbs: Secondary | ICD-10-CM

## 2016-04-22 DIAGNOSIS — G620 Drug-induced polyneuropathy: Secondary | ICD-10-CM | POA: Diagnosis not present

## 2016-04-22 DIAGNOSIS — T451X5A Adverse effect of antineoplastic and immunosuppressive drugs, initial encounter: Secondary | ICD-10-CM

## 2016-04-22 LAB — COMPREHENSIVE METABOLIC PANEL
ALBUMIN: 3.8 g/dL (ref 3.5–5.0)
ALK PHOS: 74 U/L (ref 40–150)
ALT: 24 U/L (ref 0–55)
AST: 27 U/L (ref 5–34)
Anion Gap: 12 mEq/L — ABNORMAL HIGH (ref 3–11)
BILIRUBIN TOTAL: 0.44 mg/dL (ref 0.20–1.20)
BUN: 20.3 mg/dL (ref 7.0–26.0)
CO2: 26 meq/L (ref 22–29)
CREATININE: 1.1 mg/dL (ref 0.6–1.1)
Calcium: 9.8 mg/dL (ref 8.4–10.4)
Chloride: 101 mEq/L (ref 98–109)
EGFR: 68 mL/min/{1.73_m2} — ABNORMAL LOW (ref >90)
GLUCOSE: 142 mg/dL — AB (ref 70–140)
Potassium: 3.9 mEq/L (ref 3.5–5.1)
SODIUM: 138 meq/L (ref 136–145)
TOTAL PROTEIN: 8.2 g/dL (ref 6.4–8.3)

## 2016-04-22 LAB — CBC WITH DIFFERENTIAL/PLATELET
BASO%: 0.4 % (ref 0.0–2.0)
Basophils Absolute: 0 10*3/uL (ref 0.0–0.1)
EOS ABS: 0.1 10*3/uL (ref 0.0–0.5)
EOS%: 1.6 % (ref 0.0–7.0)
HCT: 39 % (ref 34.8–46.6)
HEMOGLOBIN: 12.9 g/dL (ref 11.6–15.9)
LYMPH%: 37.7 % (ref 14.0–49.7)
MCH: 30.6 pg (ref 25.1–34.0)
MCHC: 33.2 g/dL (ref 31.5–36.0)
MCV: 92.2 fL (ref 79.5–101.0)
MONO#: 1 10*3/uL — ABNORMAL HIGH (ref 0.1–0.9)
MONO%: 14.1 % — AB (ref 0.0–14.0)
NEUT%: 46.2 % (ref 38.4–76.8)
NEUTROS ABS: 3.2 10*3/uL (ref 1.5–6.5)
Platelets: 302 10*3/uL (ref 145–400)
RBC: 4.23 10*6/uL (ref 3.70–5.45)
RDW: 13.9 % (ref 11.2–14.5)
WBC: 7 10*3/uL (ref 3.9–10.3)
lymph#: 2.7 10*3/uL (ref 0.9–3.3)

## 2016-04-22 NOTE — Progress Notes (Signed)
OFFICE PROGRESS NOTE   April 22, 2016   Physicians: Terrence Dupont Rossi/ W. 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 She saw Dr [name] 01-29-16 and will see her again in Feb, with follow up CA 125.   Patient has slight scratchy throat and slight congestion new today, no history of allergies and this seems likely start of viral type URI. Previously she has had no respiratory symptoms of esophagitis. This if first illness that she has had recently, no fever, no SOB, no cough, no ear pain. Feet are still bothersome, about same as previously, some days worse and difficult to walk, some days better. She has most tingling when she goes to bed, which makes it difficult for her to fall asleep. She also has much more severe pain bottoms of feet when she first gets up in AMs, that discomfort improving after she walks. No significant neuropathy in hands, no new or different neurologic symptoms.  Recurrent ventral abdominal hernia seems unchanged, "hard" at times, not painful. She is not using an abdominal binder, which may be helpful for symptoms. No abdominal or pelvic discomfort otherwise, bowels move well daily, no bladder symptoms. No bleeding. No LE swelling. She checks blood sugars tid, doing so well that she is now off insulin, is still on metformin; blood sugars tend to run ~ 120, tho 135 this AM which may reflect the URI. She is doing so well with DM that she is now on every 6 month follow up. Remainder of 10 point Review of Systems negative.   No PAC Genetics testing normal by [employer] panel 09-15-14 Pre-op CA 125 (06-30-14) 58  ONCOLOGIC HISTORY Patient presented to ED 06-28-14 with fairly acute onset of low back pain, without known trauma, so severe at that time  that she was having difficulty standing and walking; blood pressure in ED was 260/148 such that she was admitted for hypertensive urgency. Evaluation included CT CAP 06-28-14 which demonstrated a 4.4 cm right adnexal mass, with stranding in omentum and small fluid in pelvis, as well as mild mediastinal and hilar adenopathy. US showed 4.6 x 5.2 x 3.8 cm apparent complex ovarian mass. CA 125 was 58 on 06-30-14. She was seen in consultation by Dr [name] on 07-08-14. Imaging otherwise showed no aortic aneurysm; MRI of L spine 06-29-14 found some facet hypertrophy and small right paracentral disc protrusion L5S1. Blood pressure was treated and patient established with [facility], Dr Doreene Burke.  She was stable for surgery by Dr Skeet Latch 07-28-14, which was exploratory laparotomy with BSO, infragastric omentectomy, washings and biopsies and appendectomy. Pathology [phone]) found low grade serous carcinoma thruout. Post operative course was unremarkable. She saw Dr [name] on 08-03-14, with recommendation for 6 cycles of taxol and carboplatin then consideration of Megace maintenance; she will see Dr [name] again after chemo completes. Cycle 1 carbo taxol was 08-25-14, with leukopenia by day 8 such that granix was added x 3 doses. Delayed nausea was a problem with initial cycles, EMEND approved for cycle 3, helpful with nausea but likely allergic reaction to this cycle 4. Cycle 6 completed on 12-08-14. Patient had exploratory laparotomy 07-06-15 with lysis of adhesions and partial small bowel resection + mesh repair of ventral abdominal hernia. Pathology [phone] negative for malignancy. She had reoccurrence of the ventral  abdominal hernia ~ Aug 2017.    Objective:  Vital signs in last 24 hours:  BP 124/78 (BP Location: Left Arm, Patient Position: Sitting)   Pulse 66   Temp 98.4 F (36.9 C) (Oral)   Resp 14   Ht 5\' 5"  (1.651 m)   Wt 216 lb 6.4 oz (98.2 kg)   SpO2 100%   BMI 36.01 kg/m  Weight up  2 lbs Alert, oriented and appropriate. Ambulatory without difficulty.  No alopecia  HEENT:PERRL, sclerae not icteric. Oral mucosa moist without lesions, posterior pharynx clear, nasal turbinates without purulent drainage, TMs no erythema.  Neck supple Lymphatics:no cervical,supraclavicular, axillary or inguinal adenopathy Resp: clear to auscultation bilaterally and normal percussion bilaterally Cardio: regular rate and rhythm. No gallop. GI: abdomen obese, soft, nontender, with large upper ventral hernia also not tender. Not distended, no other mass or organomegaly appreciable. Normally active bowel sounds. Surgical incision otherwise not remarkable.  Musculoskeletal/ Extremities: without pitting edema, cords, tenderness Neuro: peripheral neuropathy feet bilaterally. Otherwise nonfocal. PSYCH appropriate mood and affect Skin without rash, ecchymosis, petechiae   Lab Results:  Results for orders placed or performed in visit on 04/22/16  CBC with Differential  Result Value Ref Range   WBC 7.0 3.9 - 10.3 10e3/uL   NEUT# 3.2 1.5 - 6.5 10e3/uL   HGB 12.9 11.6 - 15.9 g/dL   HCT 39.0 34.8 - 46.6 %   Platelets 302 145 - 400 10e3/uL   MCV 92.2 79.5 - 101.0 fL   MCH 30.6 25.1 - 34.0 pg   MCHC 33.2 31.5 - 36.0 g/dL   RBC 4.23 3.70 - 5.45 10e6/uL   RDW 13.9 11.2 - 14.5 %   lymph# 2.7 0.9 - 3.3 10e3/uL   MONO# 1.0 (H) 0.1 - 0.9 10e3/uL   Eosinophils Absolute 0.1 0.0 - 0.5 10e3/uL   Basophils Absolute 0.0 0.0 - 0.1 10e3/uL   NEUT% 46.2 38.4 - 76.8 %   LYMPH% 37.7 14.0 - 49.7 %   MONO% 14.1 (H) 0.0 - 14.0 %   EOS% 1.6 0.0 - 7.0 %   BASO% 0.4 0.0 - 2.0 %   CMET available after visit has glucose 142.  CA 125 available after visit 20, this having been 15.4 in Aug and 17 in 09-2015.   Studies/Results: EXAM: DIGITAL SCREENING BILATERAL MAMMOGRAM WITH CAD  04-08-16  COMPARISON:  Previous exam(s).  ACR Breast Density Category b: There are scattered areas of fibroglandular  density.  FINDINGS: There are no findings suspicious for malignancy. Images were processed with CAD.  IMPRESSION: No mammographic evidence of malignancy. A result letter of this screening mammogram will be mailed directly to the patient.  RECOMMENDATION: Screening mammogram in one year. (Code:[id number]-B-01Y)  BI-RADS CATEGORY  1: Negative.  Medications: I have reviewed the patient's current medications.  DISCUSSION Clinically doing well from standpoint of previous low grade serous primary peritoneal carcinoma. Will follow up labs pending. She will see Dr [name] with CA 125 in 07-2016. She is aware that another medical oncologist will be here after first of year, and that Dr [name] will make next appointments to gyn onc +/- med onc at her next visit.  Likely starting viral URI. Encouraged lots of fluids, tylenol ok. She knows to contact PCP if symptoms worsen significantly   Neuropathy in feet: may be multifactorial with taxol and DM, but would like to be sure no element of plantar fasciitis that might be more easily improved. Discussed referral to podiatrist, and mentioned in general some interventions  for plantar fasciitis. Patient would like to consult with podiatry, referral placed. MD unable to get thru lines to [place] now, message to scheduler.  Assessment/Plan: 1. 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 CA 125 minimally higher / no real change. She will see Dr [name] in Feb 2018 with CA 125, further apts to be set up from that visit.   Genetics testing negative 2. Recurrent upper abdominal ventral hernia: following mesh repair 06-2015. Patient is comfortable with Dr Serita Grit recommendation to follow with observation now. Known to [name] previously. Will try abdominal binder for support 3.Diabetes diagnosed 2017: on metformin but no longer requiring insulin,  followed by PCP.  4.Hypertensive urgency at presentation, BP much better, followed by [facility] with [facility] 5.mammograms done 04-09-16 ok and breast tissue not dense, needs yearly 6.intermittent low back pain: degenerative change seen on MRI L spine.  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 Will refer to podiatrist, to include consideration of plantar fasciitis worsening other neuropathy, tho she is aware that symptoms may be entirely from chemo +/- DM. 10.Post partial thyroidectomy  11. DIfficult IV access. Likely would need PAC if additional chemo in future 12.allergy to EMEND 13.Obesity: BMI 35. Getting more exercise at home and watching diet, still some hypoglycemia episodes making this more difficult. Encouraged attention to weight. 14. flu vaccine 03-14-16 15.viral URI beginning, vs environmental allergies. No indication for antibiotics. Patient should contact PCP if symptoms worsen significantly.   All questions answered and patient knows that she should call if concerns prior to next scheduled visit. TIme spent 20 min including >50% counseling and coordination of care.    Evlyn Clines, MD   04/22/2016, 8:22 AM

## 2016-04-23 ENCOUNTER — Encounter: Payer: Self-pay | Admitting: Oncology

## 2016-04-23 ENCOUNTER — Telehealth: Payer: Self-pay

## 2016-04-23 ENCOUNTER — Other Ambulatory Visit: Payer: Self-pay | Admitting: Gynecologic Oncology

## 2016-04-23 DIAGNOSIS — T451X5A Adverse effect of antineoplastic and immunosuppressive drugs, initial encounter: Secondary | ICD-10-CM

## 2016-04-23 DIAGNOSIS — G5793 Unspecified mononeuropathy of bilateral lower limbs: Secondary | ICD-10-CM | POA: Insufficient documentation

## 2016-04-23 DIAGNOSIS — C569 Malignant neoplasm of unspecified ovary: Secondary | ICD-10-CM

## 2016-04-23 DIAGNOSIS — G620 Drug-induced polyneuropathy: Secondary | ICD-10-CM | POA: Insufficient documentation

## 2016-04-23 LAB — CA 125: CANCER ANTIGEN (CA) 125: 20.2 U/mL (ref 0.0–38.1)

## 2016-04-23 NOTE — Telephone Encounter (Signed)
-----   Message from Gordy Levan, MD sent at 04/23/2016  1:54 PM EST ----- Labs seen and need follow up: please let her know marker is just slightly up, still well in normal range and may be just slight variation. She will have this repeated when she sees gyn oncology 07-22-16

## 2016-04-23 NOTE — Telephone Encounter (Signed)
Told Ms Fernicola the results of the CA-125 as noted below by [name]. Ms Beiermann verbalized understanding.

## 2016-04-29 MED FILL — METFORMIN HCL ER 500 MG TAB: 500 | 30 days supply | Qty: 120 | Fill #2

## 2016-05-06 ENCOUNTER — Encounter: Payer: Self-pay | Admitting: Podiatry

## 2016-05-06 ENCOUNTER — Ambulatory Visit (INDEPENDENT_AMBULATORY_CARE_PROVIDER_SITE_OTHER): Payer: Medicaid Other | Admitting: Podiatry

## 2016-05-06 ENCOUNTER — Ambulatory Visit (INDEPENDENT_AMBULATORY_CARE_PROVIDER_SITE_OTHER): Payer: Medicaid Other

## 2016-05-06 VITALS — BP 146/84 | HR 79 | Resp 16 | Ht 65.0 in | Wt 216.0 lb

## 2016-05-06 DIAGNOSIS — M21619 Bunion of unspecified foot: Secondary | ICD-10-CM

## 2016-05-06 DIAGNOSIS — M79672 Pain in left foot: Principal | ICD-10-CM

## 2016-05-06 DIAGNOSIS — M2010 Hallux valgus (acquired), unspecified foot: Secondary | ICD-10-CM | POA: Diagnosis not present

## 2016-05-06 DIAGNOSIS — M79671 Pain in right foot: Secondary | ICD-10-CM

## 2016-05-06 DIAGNOSIS — M216X9 Other acquired deformities of unspecified foot: Secondary | ICD-10-CM | POA: Diagnosis not present

## 2016-05-06 DIAGNOSIS — G629 Polyneuropathy, unspecified: Secondary | ICD-10-CM

## 2016-05-06 MED ORDER — GABAPENTIN 300 MG PO CAPS: 300.0000 mg | ORAL_CAPSULE | Freq: Three times a day (TID) | ORAL | 3 refills | Status: DC

## 2016-05-06 MED FILL — GABAPENTIN 300 MG CAPSULE: 300 | 30 days supply | Qty: 90 | Fill #0

## 2016-05-06 NOTE — Progress Notes (Signed)
Subjective:     Patient ID: Sarah [name], female   DOB: [birth year], 50 y.o.   MRN: [id number]  HPI patient presents with tingling of her forefoot bilateral with long-term history of diabetes and history of chemotherapy for ovarian cancer   Review of Systems  All other systems reviewed and are negative.      Objective:   Physical Exam  Constitutional: She is oriented to person, place, and time.  Cardiovascular: Intact distal pulses.   Musculoskeletal: Normal range of motion.  Neurological: She is oriented to person, place, and time.  Skin: Skin is warm and dry.  Nursing note and vitals reviewed.  vascular status was found to be intact with muscle strength adequate range of motion within normal limits with patient having mild loss of vibratory distal digits and metatarsals. Patient does have good sharp dull reaction at this time and was found to have good digital perfusion and is well oriented 3     Assessment:     Possibility for mild neuropathic condition bilateral which may be due to chemotherapy or possibly do to long-term diabetes that's now under good control but has not always been    Plan:     H&P x-rays reviewed bunions discussed and neuropathy discussed. At this point I placed on gabapentin 300 mg to start one at night followed by one in the morning when mid afternoon if tolerated well and she will follow-up with family physician if this is effective which I believe it will be. She'll be seen back as needed to discuss her bunions today which I do not recommend correction   X-ray report indicates that there is structural deformity first metatarsal bilateral with deviation of the hallux

## 2016-05-06 NOTE — Progress Notes (Signed)
   Subjective:    Patient ID: Sarah [name], female    DOB: 03/11/1966, 50 y.o.   MRN: [id number]  HPI Chief Complaint  Patient presents with  . Foot Pain    Bilateral; dorsal & plantar forefoot; pt stated; "After had chemo last year, feet started to hurt; feet tingle"; pt Diabetic Type 2; Sugar=120 this am; A1C=6.4  . Toe Pain    Bilateral; all toes; pt stated, "All toes tingle"; Since June 2016      Review of Systems  Respiratory: Positive for cough.   Musculoskeletal: Positive for arthralgias, back pain and gait problem.  Neurological: Positive for numbness.  All other systems reviewed and are negative.      Objective:   Physical Exam        Assessment & Plan:

## 2016-05-09 ENCOUNTER — Other Ambulatory Visit: Payer: Self-pay | Admitting: Internal Medicine

## 2016-05-09 MED FILL — LISINOPRIL-HCTZ 10-12.5 MG: 10-12.5 | 30 days supply | Qty: 30 | Fill #9

## 2016-05-09 MED FILL — AMLODIPINE BESYLATE 10 MG T: 10 | 30 days supply | Qty: 30 | Fill #9

## 2016-05-09 MED FILL — METOPROLOL TARTRATE 25 MG T: 25 | 30 days supply | Qty: 30 | Fill #0

## 2016-05-09 MED FILL — ATORVASTATIN 20 MG TABLET: 20 | 30 days supply | Qty: 30 | Fill #9

## 2016-05-14 MED FILL — ACCU-CHEK SMARTVIEW STRIP: 30 days supply | Qty: 100 | Fill #2

## 2016-05-20 ENCOUNTER — Telehealth: Payer: Self-pay | Admitting: Family Medicine

## 2016-05-20 ENCOUNTER — Encounter: Payer: Self-pay | Admitting: Family Medicine

## 2016-05-20 ENCOUNTER — Ambulatory Visit: Payer: Medicaid Other | Attending: Family Medicine | Admitting: Family Medicine

## 2016-05-20 VITALS — BP 138/86 | HR 77 | Temp 98.5°F | Resp 18 | Ht 66.0 in | Wt 224.6 lb

## 2016-05-20 DIAGNOSIS — H66002 Acute suppurative otitis media without spontaneous rupture of ear drum, left ear: Secondary | ICD-10-CM | POA: Insufficient documentation

## 2016-05-20 DIAGNOSIS — I10 Essential (primary) hypertension: Secondary | ICD-10-CM

## 2016-05-20 DIAGNOSIS — H9202 Otalgia, left ear: Secondary | ICD-10-CM | POA: Diagnosis present

## 2016-05-20 DIAGNOSIS — R05 Cough: Secondary | ICD-10-CM | POA: Diagnosis present

## 2016-05-20 MED ORDER — IBUPROFEN 400 MG PO TABS: 400.0000 mg | ORAL_TABLET | Freq: Three times a day (TID) | ORAL | 0 refills | Status: DC | PRN

## 2016-05-20 MED ORDER — AMOXICILLIN 875 MG PO TABS: 875.0000 mg | ORAL_TABLET | Freq: Two times a day (BID) | ORAL | 0 refills | Status: DC

## 2016-05-20 MED ORDER — AMOXICILLIN 500 MG PO TABS: 1000.0000 mg | ORAL_TABLET | Freq: Two times a day (BID) | ORAL | 0 refills | Status: AC

## 2016-05-20 MED ORDER — HYDRALAZINE HCL 25 MG PO TABS
25.0000 mg | ORAL_TABLET | Freq: Four times a day (QID) | ORAL | 3 refills | Status: DC
Start: 2016-05-20 — End: 2016-08-15

## 2016-05-20 MED ORDER — AMLODIPINE BESYLATE 10 MG PO TABS: 10.0000 mg | ORAL_TABLET | Freq: Every day | ORAL | 3 refills | Status: DC

## 2016-05-20 MED FILL — hydrALAZINE HCL 25 MG TABS: 25 | 22 days supply | Qty: 90 | Fill #0

## 2016-05-20 MED FILL — IBUPROFEN 400 MG TABLET: 400 | 8 days supply | Qty: 24 | Fill #0

## 2016-05-20 MED FILL — AMOXICILLIN 500 MG CAPSULE: 500 | 2 days supply | Qty: 10 | Fill #0

## 2016-05-20 NOTE — Telephone Encounter (Signed)
Pt requesting clarification on new blood pressure medications that were prescribed today

## 2016-05-20 NOTE — Patient Instructions (Addendum)
Hypertension Hypertension is another name for high blood pressure. High blood pressure forces your heart to work harder to pump blood. A blood pressure reading has two numbers, which includes a higher number over a lower number (example: 110/72). Follow these instructions at home:  Have your blood pressure rechecked by your doctor.  Only take medicine as told by your doctor. Follow the directions carefully. The medicine does not work as well if you skip doses. Skipping doses also puts you at risk for problems.  Do not smoke.  Monitor your blood pressure at home as told by your doctor. Contact a doctor if:  You think you are having a reaction to the medicine you are taking.  You have repeat headaches or feel dizzy.  You have puffiness (swelling) in your ankles.  You have trouble with your vision. Get help right away if:  You get a very bad headache and are confused.  You feel weak, numb, or faint.  You get chest or belly (abdominal) pain.  You throw up (vomit).  You cannot breathe very well. This information is not intended to replace advice given to you by your health care provider. Make sure you discuss any questions you have with your health care provider. Document Released: 11/13/2007 Document Revised: 11/02/2015 Document Reviewed: 03/19/2013 Elsevier Interactive Patient Education  2017 [employer].  Return in 1 week for blood pressure check.  Otitis Media, Adult Otitis media is redness, soreness, and puffiness (swelling) in the space just behind your eardrum (middle ear). It may be caused by allergies or infection. It often happens along with a cold. Follow these instructions at home:  Take your medicine as told. Finish it even if you start to feel better.  Only take over-the-counter or prescription medicines for pain, discomfort, or fever as told by your doctor.  Follow up with your doctor as told. Contact a doctor if:  You have otitis media only in one ear, or  bleeding from your nose, or both.  You notice a lump on your neck.  You are not getting better in 3-5 days.  You feel worse instead of better. Get help right away if:  You have pain that is not helped with medicine.  You have puffiness, redness, or pain around your ear.  You get a stiff neck.  You cannot move part of your face (paralysis).  You notice that the bone behind your ear hurts when you touch it. This information is not intended to replace advice given to you by your health care provider. Make sure you discuss any questions you have with your health care provider. Document Released: 11/13/2007 Document Revised: 11/02/2015 Document Reviewed: 12/22/2012 Elsevier Interactive Patient Education  2017 [employer].

## 2016-05-20 NOTE — Progress Notes (Signed)
Patient is here for left side Earache  Patient complains of cold and cough symptoms being present for a week.  Patient states left ear pain is scaled at a 9 and patient denies any fever.

## 2016-05-20 NOTE — Progress Notes (Signed)
Subjective:  Patient ID: Sarah [name], female    DOB: 1965/12/03  Age: 50 y.o. MRN: [id number]  CC: Otalgia   HPI Sarah [name] presents for c/o left ear pain and cough. Left ear pain began 1 week ago. She denies any fevers, hearing loss,  trauma, drainage, or sore throat. She reports taking OTC ear drops for symptoms with no relief. She also have c/o cough. She reports cough has been present for " as long as I can remember".  Pt.reports she noticed cough after beginning her one of her  HTN medications. She denies any CP or SOB.    Outpatient Medications Prior to Visit  Medication Sig Dispense Refill  . ACCU-CHEK SMARTVIEW test strip TID and a bedtime. E11.9 100 each 12  . acetaminophen-codeine (TYLENOL #3) 300-30 MG tablet Take 1 tablet by mouth every 4 (four) hours as needed. 60 tablet 0  . atorvastatin (LIPITOR) 20 MG tablet Take 1 tablet (20 mg total) by mouth daily. 90 tablet 3  . blood glucose meter kit and supplies Dispense based on patient and insurance preference. Use up to four times daily as directed. (FOR ICD-9 250.00, 250.01). 1 each 0  . gabapentin (NEURONTIN) 300 MG capsule Take 1 capsule (300 mg total) by mouth 3 (three) times daily. 90 capsule 3  . Insulin Pen Needle 31G X 5 MM MISC For use with lantus pen for injections once daily 50 each 10  . Lancets (ACCU-CHEK MULTICLIX) lancets   0  . latanoprost (XALATAN) 0.005 % ophthalmic solution Place 1 drop into both eyes at bedtime.    . metFORMIN (GLUCOPHAGE-XR) 500 MG 24 hr tablet TAKE 2 TABLETS BY MOUTH 2 TIMES DAILY WITH A MEAL. 120 tablet 3  . metoprolol tartrate (LOPRESSOR) 25 MG tablet TAKE 1/2 TABLET BY MOUTH 2 TIMES A DAY 30 tablet 2  . timolol (TIMOPTIC) 0.5 % ophthalmic solution Place 1 drop into both eyes daily.    Marland Kitchen amLODipine (NORVASC) 10 MG tablet Take 1 tablet (10 mg total) by mouth daily. 90 tablet 3  . lisinopril-hydrochlorothiazide (PRINZIDE,ZESTORETIC) 10-12.5 MG tablet Take 1 tablet by mouth daily. 90 tablet  3   No facility-administered medications prior to visit.     ROS Review of Systems  Constitutional: Negative.   HENT: Positive for ear pain (left ear). Negative for ear discharge, hearing loss and sore throat.   Eyes: Negative.   Respiratory: Negative.   Cardiovascular: Negative.     Objective:  BP 138/86 (BP Location: Left Arm, Patient Position: Sitting, Cuff Size: Normal)   Pulse 77   Temp 98.5 F (36.9 C) (Oral)   Resp 18   Ht 5' 6"  (1.676 m)   Wt 224 lb 9.6 oz (101.9 kg)   SpO2 99%   BMI 36.25 kg/m   BP/Weight 05/20/2016 05/06/2016 82/95/6213  Systolic BP 086 578 469  Diastolic BP 86 84 78  Wt. (Lbs) 224.6 216 216.4  BMI 36.25 35.94 36.01    Physical Exam  Constitutional: She appears well-developed and well-nourished.  HENT:  Head: Normocephalic and atraumatic.  Right Ear: Hearing and external ear normal.  Left Ear: Hearing and ear canal normal. There is tenderness. No drainage or swelling. No foreign bodies. Tympanic membrane is bulging (opaque). No decreased hearing is noted.  Nose: Rhinorrhea (dried, white nasal dischage present.) present.  Mouth/Throat: No oropharyngeal exudate.  Eyes: Conjunctivae are normal. Right eye exhibits no discharge. Left eye exhibits no discharge.  Neck: Normal range of motion. Neck supple.  Left  sided neck tenderness with palpation.  Cardiovascular: Normal rate, regular rhythm and normal heart sounds.   Pulmonary/Chest: Effort normal and breath sounds normal.  Lymphadenopathy:    She has no cervical adenopathy.     Assessment & Plan:   Problem List Items Addressed This Visit      Cardiovascular and Mediastinum   Essential hypertension   Relevant Medications   hydrALAZINE (APRESOLINE) 25 MG tablet   amLODipine (NORVASC) 10 MG tablet    Other Visit Diagnoses    Acute suppurative otitis media of left ear without spontaneous rupture of tympanic membrane, recurrence not specified    -  Primary   Relevant Medications    amoxicillin (AMOXIL) 875 MG tablet   ibuprofen (ADVIL,MOTRIN) 400 MG tablet      Meds ordered this encounter  Medications  . hydrALAZINE (APRESOLINE) 25 MG tablet    Sig: Take 1 tablet (25 mg total) by mouth 4 (four) times daily.    Dispense:  90 tablet    Refill:  3    Order Specific Question:   Supervising Provider    Answer:   Tresa Garter [id number]  . amLODipine (NORVASC) 10 MG tablet    Sig: Take 1 tablet (10 mg total) by mouth daily.    Dispense:  90 tablet    Refill:  3    Order Specific Question:   Supervising Provider    Answer:   Tresa Garter [id number]  . amoxicillin (AMOXIL) 875 MG tablet    Sig: Take 1 tablet (875 mg total) by mouth 2 (two) times daily.    Dispense:  10 tablet    Refill:  0    Order Specific Question:   Supervising Provider    Answer:   Tresa Garter [id number]  . ibuprofen (ADVIL,MOTRIN) 400 MG tablet    Sig: Take 1 tablet (400 mg total) by mouth every 8 (eight) hours as needed.    Dispense:  24 tablet    Refill:  0    Order Specific Question:   Supervising Provider    Answer:   Tresa Garter [id number]    Follow-up: Return in 3 months (on 08/18/2016) for Return in 1 week for blood pressure check. Follow up in 3 mths for HTN.Alfonse Spruce FNP

## 2016-05-21 NOTE — Telephone Encounter (Signed)
Patient verified DOB Patient is aware of PCP stopping the lisinopril-HCTZ and beginning the Hydralazine. Patient states she will be able to pick up the new medication tomorrow as the pharmacy did not have it in-stock at the time of prescribing. Patient had no further questions at this time.

## 2016-05-24 ENCOUNTER — Ambulatory Visit: Payer: Medicaid Other | Admitting: Family Medicine

## 2016-05-27 ENCOUNTER — Ambulatory Visit: Payer: Medicaid Other | Attending: Internal Medicine | Admitting: *Deleted

## 2016-05-27 VITALS — BP 134/88 | HR 88 | Resp 16

## 2016-05-27 DIAGNOSIS — I10 Essential (primary) hypertension: Secondary | ICD-10-CM

## 2016-05-29 MED FILL — METFORMIN HCL ER 500 MG TAB: 500 | 30 days supply | Qty: 120 | Fill #3

## 2016-06-11 MED FILL — METOPROLOL TARTRATE 25 MG T: 25 | 30 days supply | Qty: 30 | Fill #1

## 2016-06-11 MED FILL — AMLODIPINE BESYLATE 10 MG T: 10 | 30 days supply | Qty: 30 | Fill #10

## 2016-06-11 MED FILL — ATORVASTATIN 20 MG TABLET: 20 | 30 days supply | Qty: 30 | Fill #10

## 2016-06-28 ENCOUNTER — Telehealth: Payer: Self-pay | Admitting: Internal Medicine

## 2016-06-28 ENCOUNTER — Other Ambulatory Visit: Payer: Self-pay | Admitting: Internal Medicine

## 2016-06-28 MED ORDER — METFORMIN HCL ER 500 MG PO TB24: ORAL_TABLET | ORAL | 3 refills | Status: DC

## 2016-06-28 NOTE — Telephone Encounter (Signed)
Medication Refill: metFORMIN (GLUCOPHAGE-XR) 500 MG 24 hr tablet

## 2016-06-28 NOTE — Telephone Encounter (Signed)
Metformin refilled

## 2016-07-08 MED FILL — AMLODIPINE BESYLATE 10 MG T: 10 | 30 days supply | Qty: 30 | Fill #11

## 2016-07-08 MED FILL — ATORVASTATIN 20 MG TABLET: 20 | 30 days supply | Qty: 30 | Fill #11

## 2016-07-08 MED FILL — METOPROLOL TARTRATE 25 MG T: 25 | 30 days supply | Qty: 30 | Fill #2

## 2016-07-08 MED FILL — GABAPENTIN 300 MG CAPSULE: 300 | 30 days supply | Qty: 90 | Fill #1

## 2016-07-22 ENCOUNTER — Ambulatory Visit: Payer: Medicaid Other | Attending: Gynecologic Oncology | Admitting: Gynecologic Oncology

## 2016-07-22 ENCOUNTER — Encounter: Payer: Self-pay | Admitting: Gynecologic Oncology

## 2016-07-22 ENCOUNTER — Other Ambulatory Visit (HOSPITAL_BASED_OUTPATIENT_CLINIC_OR_DEPARTMENT_OTHER): Payer: Medicaid Other

## 2016-07-22 VITALS — BP 152/94 | HR 74 | Temp 98.5°F | Resp 18 | Ht 66.0 in | Wt 221.8 lb

## 2016-07-22 DIAGNOSIS — K432 Incisional hernia without obstruction or gangrene: Secondary | ICD-10-CM | POA: Diagnosis not present

## 2016-07-22 DIAGNOSIS — Z90722 Acquired absence of ovaries, bilateral: Secondary | ICD-10-CM

## 2016-07-22 DIAGNOSIS — K439 Ventral hernia without obstruction or gangrene: Secondary | ICD-10-CM

## 2016-07-22 DIAGNOSIS — H409 Unspecified glaucoma: Secondary | ICD-10-CM | POA: Diagnosis not present

## 2016-07-22 DIAGNOSIS — C561 Malignant neoplasm of right ovary: Secondary | ICD-10-CM | POA: Insufficient documentation

## 2016-07-22 DIAGNOSIS — Z9221 Personal history of antineoplastic chemotherapy: Secondary | ICD-10-CM | POA: Diagnosis not present

## 2016-07-22 DIAGNOSIS — E119 Type 2 diabetes mellitus without complications: Secondary | ICD-10-CM | POA: Diagnosis not present

## 2016-07-22 DIAGNOSIS — I10 Essential (primary) hypertension: Secondary | ICD-10-CM | POA: Diagnosis not present

## 2016-07-22 DIAGNOSIS — Z794 Long term (current) use of insulin: Secondary | ICD-10-CM | POA: Diagnosis not present

## 2016-07-22 DIAGNOSIS — Z8543 Personal history of malignant neoplasm of ovary: Secondary | ICD-10-CM

## 2016-07-22 DIAGNOSIS — C569 Malignant neoplasm of unspecified ovary: Secondary | ICD-10-CM

## 2016-07-22 NOTE — Progress Notes (Signed)
POST-TREATMENT FOLLOWUP. TREATMENT COUNSELING.  Assessment:   51 y.o. 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   S/p BSO, omentectomy, appendectomy on 07/28/14. S/p adjuvant chemotherapy with 6 cycles of paclitaxel and carboplatin completed on 12/08/14. S/p an ex lap, hernia repair and small bowel resection for a presumed recurrence (benign pathology).   No evidence of recurrent disease on exam.  Plan: 1) Recurrent ventral hernia: I discussed with the patient that clinically there is no evidence of incarceration. Treatment is elective. Given that the hernia is asymptomatic and the patient does not desire another surgery, it is reasonable to expectantly manage. 2) Treatment counseling -  I recommend continuing routine surveillance with visit in 3 months and CA 125 assessments (CT scans on a prn basis for symptoms or rise in CA 125). I will see her back in June 2018. If she remains NED at that time we will transition to 6 monthly surveillance exams. 3) newly diagnosed type II DM- patient is on active therapy with insulin and oral medical therapy.  HPI:  Sarah [name] is a 51 y.o. year old initially seen in consultation on 07/08/14 for bilateral ovarian masses and ascites. This was diagnosed at the time of admission to the hospital for hypertensive crisis. This diagnosis of hypertension was new and she was newly started on a medical regimen for this. She then underwent an exploratory laparotomy, BSO, omentectomy, appendectomy on 07/28/14 with Dr Skeet Latch without complications.  Her postoperative course was uncomplicated.  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 This represented a stage IIIc disease process.  She received 6 cycles of adjuvant chemotherapy with paclitaxel and carboplatin with Dr Marko Plume on 08/25/14 to 12/08/14.  Pretreatment CA 125 was 58 on 06/30/14, and  was 20 on 12/22/14 (it's nadir had been 16 on 09/22/14).  Post treatment CT of the chest, abdomen and pelvis on 01/12/15 revealed: Previously noted paratracheal, subcarinal and prevascular mediastinal lymphadenopathy has resolved, with no residual pathologically enlarged mediastinal lymph nodes. Resolved hilar lymphadenopathy, with no residual pathologically enlarged mediastinal nodes. The previously described 3 mm subpleural left lower lobe pulmonary nodule is decreased to 2 mm (series 4/image 41). A separate 2 mm subpleural left lower lobe pulmonary nodule (4/29) is slightly decreased from 51m on 06/28/2014. A subpleural 3 mm right upper lobe pulmonary nodule (4/15) is unchanged from 06/28/2014. A 2 mm subpleural pulmonary nodule in the superior segment right lower lobe associated with the right major fissure (4/21) appears new. A 0.7 cm right posterior mesenteric node measures 0.7 cm short axis (series 2/image 93), decreased from 1.2 cm. No definite peritoneal tumor implants detected. A 0.4 cm nodule anterior/inferior to the spleen (series 2/ image 60) is mildly decreased from 0.7 cm. Small umbilical hernia containing a small bowel loop, with no evidence of small-bowel obstruction or strangulation.  She had a CA 125 on 03/27/15 that was normal and stable at 14.  A CT of the chest, abdomen and pelvis was performed on 04/27/15 which showed: No pulmonary mass, infiltrate, or effusion. Tiny less than 5 mm subpleural pulmonary nodules in the right upper lobe on image remain stable. No new or enlarging pulmonary nodules identified. Small left paraventral hernia containing bowel loops. New soft tissue nodularity is seen within the anterior abdominal mesenteric fat, with largest nodule measuring 10 x 17 mm. No other sites of peritoneal nodularity or soft tissue density seen.  On 07/06/15 she was taken to the OR for an ex lap, lysis of adhesions, and small bowel resection with ventral hernia repair. FIndings were  remarkable for substantial small bowel adhesions, hwoever the mass that had been seen on imaging was not appreciated intraperitoneally. There was white nodularity and stranding on the mesentery of a segment of small bowel. It was completely resected with the small bowel resection. Pathology was benign.  Postoperatively she was noted to have very high blood glucose on acuchecks. An HbA1C was drawn on 07/07/15 and was very elevated at 10.1%. She was started on Insulin.   Interval Hx: She has been doing well with persistent neuropathy in her feet. She has developed a bulge in the upper abdomen which is nonpainful.  Her last CA 125 was 15 on 01/22/16 and 20.2 on 04/22/16.  Current Outpatient Prescriptions on File Prior to Visit  Medication Sig Dispense Refill  . ACCU-CHEK SMARTVIEW test strip TID and a bedtime. E11.9 100 each 12  . acetaminophen-codeine (TYLENOL #3) 300-30 MG tablet Take 1 tablet by mouth every 4 (four) hours as needed. 60 tablet 0  . amLODipine (NORVASC) 10 MG tablet Take 1 tablet (10 mg total) by mouth daily. 90 tablet 3  . atorvastatin (LIPITOR) 20 MG tablet Take 1 tablet (20 mg total) by mouth daily. 90 tablet 3  . blood glucose meter kit and supplies Dispense based on patient and insurance preference. Use up to four times daily as directed. (FOR ICD-9 250.00, 250.01). 1 each 0  . gabapentin (NEURONTIN) 300 MG capsule Take 1 capsule (300 mg total) by mouth 3 (three) times daily. 90 capsule 3  . hydrALAZINE (APRESOLINE) 25 MG tablet Take 1 tablet (25 mg total) by mouth 4 (four) times daily. 90 tablet 3  . ibuprofen (ADVIL,MOTRIN) 400 MG tablet Take 1 tablet (400 mg total) by mouth every 8 (eight) hours as needed. 24 tablet 0  . Insulin Pen Needle 31G X 5 MM MISC For use with lantus pen for injections once daily 50 each 10  . Lancets (ACCU-CHEK MULTICLIX) lancets   0  . latanoprost (XALATAN) 0.005 % ophthalmic solution Place 1 drop into both eyes at bedtime.    . metFORMIN  (GLUCOPHAGE-XR) 500 MG 24 hr tablet TAKE 2 TABLETS BY MOUTH 2 TIMES DAILY WITH A MEAL. 120 tablet 3  . metoprolol tartrate (LOPRESSOR) 25 MG tablet TAKE 1/2 TABLET BY MOUTH 2 TIMES A DAY 30 tablet 2  . timolol (TIMOPTIC) 0.5 % ophthalmic solution Place 1 drop into both eyes daily.     No current facility-administered medications on file prior to visit.    Allergies  Allergen Reactions  . Emend [Aprepitant] Other (See Comments)    Urticaria    Past Medical History:  Diagnosis Date  . Arthritis   . Family history of colon cancer   . Glaucoma 02/16  . Glucagonoma 07/28/14   Pt denies this but states she has glaucoma  . History of chemotherapy   . Hypertension   . Imbalance   . Neuropathy (HCC)    feet bilat  . Nocturia   . Peritoneal carcinomatosis ([place])    carcinoma of ovary   . Shortness of breath dyspnea    hx of 2013 - no problems currently   . Thyroid nodule    history of   . Wears glasses    Past Surgical History:  Procedure Laterality Date  . ABDOMINAL HYSTERECTOMY    . INCISIONAL HERNIA REPAIR N/A 07/06/2015  Procedure: INCISIONAL HERNIA REPAIR ;  Surgeon: Rolm Bookbinder, MD;  Location: [facility];  Service: General;  Laterality: N/A;  . INSERTION OF MESH N/A 07/06/2015   Procedure: WITH INSERTION OF PHASIX ST MESH;  Surgeon: Rolm Bookbinder, MD;  Location: [facility];  Service: General;  Laterality: N/A;  . LAPAROTOMY N/A 07/06/2015   Procedure: EXPLORATORY LAPAROTOMY;  Surgeon: Everitt Amber, MD;  Location: [facility];  Service: Gynecology;  Laterality: N/A;  . LYSIS OF ADHESION N/A 07/06/2015   Procedure:  LYSIS OF ADHESION RESECTION OF MESENTERIC MASS BOWEL RESECTION ;  Surgeon: Everitt Amber, MD;  Location: [facility];  Service: Gynecology;  Laterality: N/A;  . ROBOTIC ASSISTED TOTAL HYSTERECTOMY WITH BILATERAL SALPINGO OOPHERECTOMY Bilateral 07/28/2014   Procedure: ROBOTIC ASSISTED lysis of adhesions with biopsies, converted to LAPAROTOMY, bilateral salpingoorphorectomy,  omentectomy,appendectomy;  Surgeon: Janie Morning, MD;  Location: [facility];  Service: Gynecology;  Laterality: Bilateral;  . THYROIDECTOMY, PARTIAL     Family History  Problem Relation Age of Onset  . Hypertension Mother   . Hypertension Father   . Diabetes Father   . Cancer Sister 50    fibrosarcoma (back); currently 17  . Prostate cancer Maternal Uncle   . Colon cancer Paternal Aunt     Dx 55s; deceased 5s  . Prostate cancer Paternal Uncle     currently 56  . Cancer Paternal Uncle 5    "bone" ; unk. primary  . Stomach cancer Paternal Uncle    Social History   Social History  . Marital status: Married    Spouse name: N/A  . Number of children: N/A  . Years of education: N/A   Occupational History  . Not on file.   Social History Main Topics  . Smoking status: Never Smoker  . Smokeless tobacco: Never Used  . Alcohol use No  . Drug use: No  . Sexual activity: Not on file   Other Topics Concern  . Not on file   Social History Narrative  . No narrative on file    Review of systems: Constitutional:  She has no weight gain or weight loss. She has no fever or chills. Eyes: No blurred vision Ears, Nose, Mouth, Throat: No dizziness, headaches or changes in hearing. No mouth sores. Cardiovascular: No chest pain, palpitations or edema. Respiratory:  No shortness of breath, wheezing or cough Gastrointestinal: She has normal bowel movements without diarrhea or constipation. She denies any nausea or vomiting. She denies blood in her stool or heart burn. Abdominal pain Genitourinary:  She denies pelvic pain, pelvic pressure or changes in her urinary function. She has no hematuria, dysuria, or incontinence. She has no irregular vaginal bleeding or vaginal discharge Musculoskeletal: Denies muscle weakness or joint pains.  Skin:  She has no skin changes, rashes or itching Neurological:  Denies dizziness or headaches. + bilateral foot neuropathy Psychiatric:  She denies  depression or anxiety. Hematologic/Lymphatic:   No easy bruising or bleeding   Physical Exam: There were no vitals taken for this visit. General: Well dressed, well nourished in no apparent distress.   HEENT:  Normocephalic and atraumatic, no lesions.  Extraocular muscles intact. Sclerae anicteric. Pupils equal, round, reactive. No mouth sores or ulcers. Thyroid is normal size, not nodular, midline. Abdomen:  Soft, nontender, nondistended.  No palpable masses.  No hepatosplenomegaly.  No ascites. Normal bowel sounds. Incision is healed. 10cm soft, reducible midline upper abdominal ventral hernia. No erythema. Not tender to palpate. Genitourinary: No pelvic masses. Extremities: No cyanosis, clubbing  or edema.  No calf tenderness or erythema. No palpable cords. Psychiatric: Mood and affect are appropriate. Neurological: Awake, alert and oriented x 3. Sensation is intact, no neuropathy.  Musculoskeletal: No pain, normal strength and range of motion.  Donaciano Eva, MD

## 2016-07-23 ENCOUNTER — Telehealth: Payer: Self-pay

## 2016-07-23 LAB — CA 125: Cancer Antigen (CA) 125: 16.7 U/mL (ref 0.0–38.1)

## 2016-07-23 NOTE — Telephone Encounter (Signed)
Pt notified of normal CA 125 result. Verbalized understanding.

## 2016-08-07 ENCOUNTER — Other Ambulatory Visit: Payer: Self-pay | Admitting: Internal Medicine

## 2016-08-07 DIAGNOSIS — E785 Hyperlipidemia, unspecified: Secondary | ICD-10-CM

## 2016-08-07 MED FILL — AMLODIPINE BESYLATE 10 MG T: 10 | 30 days supply | Qty: 30 | Fill #0

## 2016-08-07 MED FILL — ATORVASTATIN 20 MG TABLET: 20 | 30 days supply | Qty: 90 | Fill #0

## 2016-08-07 MED FILL — METOPROLOL TARTRATE 25 MG T: 25 | 30 days supply | Qty: 30 | Fill #0

## 2016-08-12 MED FILL — GABAPENTIN 300 MG CAPSULE: 300 | 30 days supply | Qty: 90 | Fill #2

## 2016-08-14 ENCOUNTER — Encounter: Payer: Self-pay | Admitting: Internal Medicine

## 2016-08-14 ENCOUNTER — Ambulatory Visit: Payer: Medicaid Other | Attending: Internal Medicine | Admitting: Internal Medicine

## 2016-08-14 VITALS — BP 136/89 | HR 86 | Temp 97.9°F | Resp 18 | Ht 65.0 in | Wt 223.4 lb

## 2016-08-14 DIAGNOSIS — C569 Malignant neoplasm of unspecified ovary: Secondary | ICD-10-CM | POA: Diagnosis not present

## 2016-08-14 DIAGNOSIS — R51 Headache: Secondary | ICD-10-CM | POA: Insufficient documentation

## 2016-08-14 DIAGNOSIS — Z79899 Other long term (current) drug therapy: Secondary | ICD-10-CM | POA: Diagnosis not present

## 2016-08-14 DIAGNOSIS — Z5189 Encounter for other specified aftercare: Secondary | ICD-10-CM | POA: Diagnosis not present

## 2016-08-14 DIAGNOSIS — Z9221 Personal history of antineoplastic chemotherapy: Secondary | ICD-10-CM | POA: Diagnosis not present

## 2016-08-14 DIAGNOSIS — Z90710 Acquired absence of both cervix and uterus: Secondary | ICD-10-CM | POA: Diagnosis not present

## 2016-08-14 DIAGNOSIS — E785 Hyperlipidemia, unspecified: Secondary | ICD-10-CM | POA: Insufficient documentation

## 2016-08-14 DIAGNOSIS — E119 Type 2 diabetes mellitus without complications: Secondary | ICD-10-CM | POA: Diagnosis not present

## 2016-08-14 DIAGNOSIS — Z800 Family history of malignant neoplasm of digestive organs: Secondary | ICD-10-CM | POA: Insufficient documentation

## 2016-08-14 DIAGNOSIS — Z794 Long term (current) use of insulin: Secondary | ICD-10-CM | POA: Diagnosis not present

## 2016-08-14 DIAGNOSIS — I10 Essential (primary) hypertension: Secondary | ICD-10-CM | POA: Diagnosis not present

## 2016-08-14 DIAGNOSIS — Z1211 Encounter for screening for malignant neoplasm of colon: Secondary | ICD-10-CM | POA: Diagnosis not present

## 2016-08-14 LAB — LIPID PANEL
CHOL/HDL RATIO: 5.1 ratio — AB (ref <5.0)
Cholesterol: 180 mg/dL (ref <200)
HDL: 35 mg/dL — AB (ref >50)
LDL CALC: 71 mg/dL (ref <100)
Triglycerides: 372 mg/dL — ABNORMAL HIGH (ref <150)
VLDL: 74 mg/dL — ABNORMAL HIGH (ref <30)

## 2016-08-14 LAB — POCT GLYCOSYLATED HEMOGLOBIN (HGB A1C): Hemoglobin A1C: 7.3

## 2016-08-14 LAB — TSH: TSH: 0.75 m[IU]/L

## 2016-08-14 MED ORDER — METFORMIN HCL 1000 MG PO TABS: 1000.0000 mg | ORAL_TABLET | Freq: Two times a day (BID) | ORAL | 3 refills | Status: DC

## 2016-08-14 NOTE — Progress Notes (Addendum)
Subjective:    Patient ID: Sarah [name], female    DOB: [birth year], 51 y.o.   MRN: [id number]  HPI Sarah [name] is a 51 y.o. female with history of hypertension, Diabetes, hyperlipidemia andstage IIIc low-grade serous carcinoma of the ovaryS/p BSO, omentectomy, appendectomy on 07/28/14. S/p adjuvant chemotherapy with 6 cycles of paclitaxel and carboplatin completed on 12/08/14. S/p an ex lap, hernia repair and small bowel resection for a presumed recurrence (benign pathology) heretodayfor a follow up visitof HTN and DM.  Her blood pressure is currently controlled, she denies chest pain or shortness of breath. Her blood sugar is control on Metformin. She report home blood sugar reading to be in the low 100s. She denies hypoglycemic events but endorsed numbness and tingling in bilateral feet. Her last eye exam was last week. Her last mammogram was 2 months ago. She endorsed occasional headache which she atributes to stress, denies lightheadnes, diziiness or syncopal episodes. Overall she feels fine, has no complain. She lost her father 2 weeks ago to sudden death, she is still going through grief but denies depression. She has strong family support.  Past Medical History:  Diagnosis Date  . Arthritis   . Family history of colon cancer   . Glaucoma 02/16  . Glucagonoma 07/28/14   Pt denies this but states she has glaucoma  . History of chemotherapy   . Hypertension   . Imbalance   . Neuropathy (HCC)    feet bilat  . Nocturia   . Peritoneal carcinomatosis ([place])    carcinoma of ovary   . Shortness of breath dyspnea    hx of 2013 - no problems currently   . Thyroid nodule    history of   . Wears glasses    Past Surgical History:  Procedure Laterality Date  . ABDOMINAL HYSTERECTOMY    . INCISIONAL HERNIA REPAIR N/A 07/06/2015   Procedure: INCISIONAL HERNIA REPAIR ;  Surgeon: Rolm Bookbinder, MD;  Location: [facility];  Service: General;  Laterality: N/A;  . INSERTION OF MESH N/A  07/06/2015   Procedure: WITH INSERTION OF PHASIX ST MESH;  Surgeon: Rolm Bookbinder, MD;  Location: [facility];  Service: General;  Laterality: N/A;  . LAPAROTOMY N/A 07/06/2015   Procedure: EXPLORATORY LAPAROTOMY;  Surgeon: Everitt Amber, MD;  Location: [facility];  Service: Gynecology;  Laterality: N/A;  . LYSIS OF ADHESION N/A 07/06/2015   Procedure:  LYSIS OF ADHESION RESECTION OF MESENTERIC MASS BOWEL RESECTION ;  Surgeon: Everitt Amber, MD;  Location: [facility];  Service: Gynecology;  Laterality: N/A;  . ROBOTIC ASSISTED TOTAL HYSTERECTOMY WITH BILATERAL SALPINGO OOPHERECTOMY Bilateral 07/28/2014   Procedure: ROBOTIC ASSISTED lysis of adhesions with biopsies, converted to LAPAROTOMY, bilateral salpingoorphorectomy, omentectomy,appendectomy;  Surgeon: Janie Morning, MD;  Location: [facility];  Service: Gynecology;  Laterality: Bilateral;  . THYROIDECTOMY, PARTIAL     Current Outpatient Prescriptions on File Prior to Visit  Medication Sig Dispense Refill  . ACCU-CHEK SMARTVIEW test strip TID and a bedtime. E11.9 100 each 12  . acetaminophen-codeine (TYLENOL #3) 300-30 MG tablet Take 1 tablet by mouth every 4 (four) hours as needed. 60 tablet 0  . amLODipine (NORVASC) 10 MG tablet Take 1 tablet (10 mg total) by mouth daily. 90 tablet 3  . atorvastatin (LIPITOR) 20 MG tablet TAKE 1 TABLET BY MOUTH DAILY 90 tablet 3  . blood glucose meter kit and supplies Dispense based on patient and insurance preference. Use up to four times daily as directed. (FOR ICD-9 250.00, 250.01). 1  each 0  . gabapentin (NEURONTIN) 300 MG capsule Take 1 capsule (300 mg total) by mouth 3 (three) times daily. 90 capsule 3  . hydrALAZINE (APRESOLINE) 25 MG tablet Take 1 tablet (25 mg total) by mouth 4 (four) times daily. 90 tablet 3  . ibuprofen (ADVIL,MOTRIN) 400 MG tablet Take 1 tablet (400 mg total) by mouth every 8 (eight) hours as needed. 24 tablet 0  . Insulin Pen Needle 31G X 5 MM MISC For use with lantus pen for injections once daily 50  each 10  . Lancets (ACCU-CHEK MULTICLIX) lancets   0  . latanoprost (XALATAN) 0.005 % ophthalmic solution Place 1 drop into both eyes at bedtime.    Marland Kitchen lisinopril-hydrochlorothiazide (PRINZIDE,ZESTORETIC) 10-12.5 MG tablet   3  . metFORMIN (GLUCOPHAGE-XR) 500 MG 24 hr tablet TAKE 2 TABLETS BY MOUTH 2 TIMES DAILY WITH A MEAL. 120 tablet 3  . metoprolol tartrate (LOPRESSOR) 25 MG tablet TAKE 1/2 TABLET BY MOUTH 2 TIMES A DAY 30 tablet 2  . timolol (TIMOPTIC) 0.5 % ophthalmic solution Place 1 drop into both eyes daily.     No current facility-administered medications on file prior to visit.    Allergies  Allergen Reactions  . Emend [Aprepitant] Other (See Comments)    Urticaria     Review of Systems  Constitutional: Negative for appetite change, chills, fatigue, fever and unexpected weight change.  HENT: Negative.   Eyes: Negative for photophobia and visual disturbance.  Respiratory: Negative for cough, chest tightness and shortness of breath.   Cardiovascular: Negative for chest pain, palpitations and leg swelling.  Gastrointestinal: Negative for constipation, nausea and vomiting.  Endocrine: Positive for heat intolerance. Negative for polydipsia, polyphagia and polyuria.  Genitourinary: Negative for dysuria.  Musculoskeletal: Negative for back pain.  Neurological: Positive for numbness (bilateral feet) and headaches. Negative for dizziness, weakness and light-headedness.  Psychiatric/Behavioral: Negative for suicidal ideas.      Objective: BP 136/89 (BP Location: Right Arm, Patient Position: Sitting, Cuff Size: Normal)   Pulse 86   Temp 97.9 F (36.6 C) (Oral)   Resp 18   Ht _0  (1.651 m)   Wt 223 lb 6.4 oz (101.3 kg)   SpO2 96%   BMI 37.18 kg/m    HgbA1C 7.3% up from 5.7% six months ago    Physical Exam  Constitutional: She is oriented to person, place, and time. She appears well-nourished. No distress.  Obese, well appearing  HENT:  Head: Normocephalic.    Mouth/Throat: Oropharynx is clear and moist.  Eyes: Conjunctivae are normal. Pupils are equal, round, and reactive to light.  Neck: Normal range of motion. Neck supple. No thyromegaly present.  Cardiovascular: Normal rate, regular rhythm, normal heart sounds and intact distal pulses.   No murmur heard. Pulmonary/Chest: Effort normal and breath sounds normal. No respiratory distress.  Abdominal: Soft. Bowel sounds are normal. There is no tenderness.  Abdomen is obese, hernia noted upper abdomen, no pulsation noted, non-tender to palpation. .  Musculoskeletal: Normal range of motion.  Lymphadenopathy:    She has no cervical adenopathy.  Neurological: She is alert and oriented to person, place, and time.  Skin: Skin is warm and dry.  Psychiatric: She has a normal mood and affect. Her behavior is normal.      Assessment & Plan:  1. Type 2 diabetes mellitus without complication, without long-term current use of insulin (HCC) Uncontrolled, A1C 7.3% up from 5.7% at last check. Will increased the metformin dose to 1g BID daily.  Low carb diet discussed and encouraged.  Aim for 30 minutes of exercise most days. Rethink what you drink. Water is great! Aim for 2-3 Carb Choices per meal (30-45 grams) +/- 1 either way  Aim for 0-15 Carbs per snack if hungry  Include protein in moderation with your meals and snacks  Consider reading food labels for Total Carbohydrate and Fat Grams of foods  Consider checking BG at alternate times per day  Continue taking medication as directed Be mindful about how much sugar you are adding to beverages and other foods. Fruit Punch - find one with no sugar  Measure and decrease portions of carbohydrate foods  Make your plate and don't go back for seconds  - POCT A1C - metFORMIN (GLUCOPHAGE) 1000 MG tablet; Take 1 tablet (1,000 mg total) by mouth 2 (two) times daily with a meal.  Dispense: 180 tablet; Refill: 3 - TSH - Lipid panel  2. Essential  hypertension   Currently controlled. Low salt diet discussed and encouraged. We have discussed target BP range and blood pressure goal. I have advised patient to check BP regularly and to call us back or report to clinic if the numbers are consistently higher than 140/90. We discussed the importance of compliance with medical therapy and DASH diet recommended, consequences of uncontrolled hypertension discussed.    3. Dyslipidemia  To address this please limit saturated fat to no more than 7% of your calories, limit cholesterol to 200 mg/day, increase fiber and exercise as tolerated. If needed we may add another cholesterol lowering medication to your regimen.   4. Colon cancer screening  - Ambulatory referral to Gastroenterology  Labs pending Health maintenance reviewed Diet and exercise encouraged Continue all meds Follow up  in 3 months   Jari Favre, RN, BSN, AGNP-Student  Evaluation and management procedures were performed by me with DNP Student in attendance, note written by DNP student under my supervision and collaboration. I have reviewed the note and I agree with the management and plan.   Angelica Chessman, MD, [place], [place], [place], [place] and [place], [place]   08/20/2016, 7:45 PM

## 2016-08-14 NOTE — Patient Instructions (Addendum)
Diabetes Mellitus and Skin Care Diabetes (diabetes mellitus) can lead to health problems over time, including skin problems. People with diabetes have a higher risk for many types of skin complications. This is because having poorly controlled blood sugar (glucose) levels can:  Damage nerves and blood vessels. This can result in decreased feeling in your legs and feet, which means you may not notice minor skin injuries that could lead to serious problems.  Reduce blood flow (circulation), which makes wounds heal more slowly and increases your risk of infection.  Cause areas of skin to become thick or discolored. What are some common skin conditions that affect people with diabetes? Diabetes often causes dry skin. It can also cause the skin on the feet to get thinner, break more easily, and heal more slowly. There are certain skin conditions that commonly affect people who have diabetes, such as:  Bacterial skin infections, such as styes, boils, infected hair follicles, and infections of the skin around the nails.  Fungal skin infections. These are most common in areas where skin rubs together, such as in the armpits or under the breasts.  Open sores, especially on the feet.  Tissue death (gangrene). This can happen on your feet if a serious infection does not heal properly. Gangrene can cause the need for a foot or leg to be surgically removed (amputated). Diabetes can also cause the skin to change. You may develop:  Dark, velvety markings on the skin that usually appear on the face, neck, armpits, inner thighs, and groin (acanthosis nigricans). This typically affects people of African-American and American-Indian descent.  Red, raised, scar-like tissue that may itch, feel painful, or develop into a wound (necrobiosis lipoidica).  Blisters on feet, toes, hands, or fingers.  Thickened, wax-like areas of skin that usually occur on the hands, forehead, or toes (digital sclerosis).  Brown or  red ring-shaped or half-ring-shaped patches of skin on the ears or fingers (disseminated granuloma).  Pea-shaped yellow bumps that may be itchy and surrounded by a red ring (eruptive xanthomatosis). This usually affects the arms, feet, buttocks, and the top of the hands.  Round, discolored patches of tan skin that do not hurt or itch (diabetic dermopathy). These may look like age spots. What do I need to know about itchy skin? It is common for people with diabetes to have itchy skin caused by dryness. Frequent high blood glucose levels can cause itchiness, and poor circulation and certain skin infections can make dry, itchy skin worse. If you have itchy skin that is red or covered in a rash, this could be a sign of an allergic reaction to a medicine. If you have a rash or if your skin is very itchy, contact your health care provider. You may need help to manage your diabetes better, or you may need treatment for an infection. How can I prevent skin breakdown? When you have diabetes and you get a badly infected ulcer or sore that does not heal, your skin can break down, especially if you have poor circulation or are on bed rest. To prevent skin breakdown:  Keep your skin clean and dry. Wash your skin often. Do not use hot water.  Do not use any products that contain nicotine or tobacco, such as cigarettes and e-cigarettes. Smoking affects the body's ability to heal. If you need help quitting, ask your health care provider.  Check your skin every day for cuts, bruises, redness, blisters, or sores, especially on your feet. Tell your health care provider  about any cuts, wounds, or sores you have, especially if they are healing slowly.  If you are on bed rest, try to change positions often. What else do I need to know about taking care of my skin?   To relieve dry skin and itching:  Limit baths and showers to 5-10 minutes.  Bathe with lukewarm water instead of hot water.  Use mild soap and  gentle skin cleansers. Do not use soap that is perfumed or harsh or dries your skin.  Put on lotion as soon as you finish bathing.  Make sure that your health care provider performs a visual foot exam at every medical visit.  Schedule a foot exam with your health care provider once every year. This exam includes an inspection of the structure and skin of your feet.  If you get a skin injury, such as a cut, blister, or sore, check the area every day for signs of infection. Check for:  More redness, swelling, or pain.  More fluid or blood.  Warmth.  Pus or a bad smell. Contact a health care provider if:  You develop a cut or sore, especially on your feet.  You develop signs of infection after a skin injury.  Your blood glucose level is higher than 240 mg/dL (13.3 mmol/L) for 2 days in a row.  You have itchy skin that develops redness or a rash.  You have discolored areas of skin.  You have areas where your skin is changing, such as thickening or appearing shiny. This information is not intended to replace advice given to you by your health care provider. Make sure you discuss any questions you have with your health care provider. Document Released: 11/07/2015 Document Revised: 12/15/2015 Document Reviewed: 11/07/2015 Elsevier Interactive Patient Education  2017 [employer]. Blood Glucose Monitoring, Adult Monitoring your blood sugar (glucose) helps you manage your diabetes. It also helps you and your health care provider determine how well your diabetes management plan is working. Blood glucose monitoring involves checking your blood glucose as often as directed, and keeping a record (log) of your results over time. Why should I monitor my blood glucose? Checking your blood glucose regularly can:  Help you understand how food, exercise, illnesses, and medicines affect your blood glucose.  Let you know what your blood glucose is at any time. You can quickly tell if you are  having low blood glucose (hypoglycemia) or high blood glucose (hyperglycemia).  Help you and your health care provider adjust your medicines as needed. When should I check my blood glucose? Follow instructions from your health care provider about how often to check your blood glucose. This may depend on:  The type of diabetes you have.  How well-controlled your diabetes is.  Medicines you are taking. If you have type 1 diabetes:   Check your blood glucose at least 2 times a day.  Also check your blood glucose:  Before every insulin injection.  Before and after exercise.  Between meals.  2 hours after a meal.  Occasionally between 2:00 a.m. and 3:00 a.m., as directed.  Before potentially dangerous tasks, like driving or using heavy machinery.  At bedtime.  You may need to check your blood glucose more often, up to 6-10 times a day:  If you use an insulin pump.  If you need multiple daily injections (MDI).  If your diabetes is not well-controlled.  If you are ill.  If you have a history of severe hypoglycemia.  If you have a  history of not knowing when your blood glucose is getting low (hypoglycemia unawareness). If you have type 2 diabetes:   If you take insulin or other diabetes medicines, check your blood glucose at least 2 times a day.  If you are on intensive insulin therapy, check your blood glucose at least 4 times a day. Occasionally, you may also need to check between 2:00 a.m. and 3:00 a.m., as directed.  Also check your blood glucose:  Before and after exercise.  Before potentially dangerous tasks, like driving or using heavy machinery.  You may need to check your blood glucose more often if:  Your medicine is being adjusted.  Your diabetes is not well-controlled.  You are ill. What is a blood glucose log?  A blood glucose log is a record of your blood glucose readings. It helps you and your health care provider:  Look for patterns in your  blood glucose over time.  Adjust your diabetes management plan as needed.  Every time you check your blood glucose, write down your result and notes about things that may be affecting your blood glucose, such as your diet and exercise for the day.  Most glucose meters store a record of glucose readings in the meter. Some meters allow you to download your records to a computer. How do I check my blood glucose? Follow these steps to get accurate readings of your blood glucose: Supplies needed    Blood glucose meter.  Test strips for your meter. Each meter has its own strips. You must use the strips that come with your meter.  A needle to prick your finger (lancet). Do not use lancets more than once.  A device that holds the lancet (lancing device).  A journal or log book to write down your results. Procedure   Wash your hands with soap and water.  Prick the side of your finger (not the tip) with the lancet. Use a different finger each time.  Gently rub the finger until a small drop of blood appears.  Follow instructions that come with your meter for inserting the test strip, applying blood to the strip, and using your blood glucose meter.  Write down your result and any notes. Alternative testing sites   Some meters allow you to use areas of your body other than your finger (alternative sites) to test your blood.  If you think you may have hypoglycemia, or if you have hypoglycemia unawareness, do not use alternative sites. Use your finger instead.  Alternative sites may not be as accurate as the fingers, because blood flow is slower in these areas. This means that the result you get may be delayed, and it may be different from the result that you would get from your finger.  The most common alternative sites are:  Forearm.  Thigh.  Palm of the hand. Additional tips   Always keep your supplies with you.  If you have questions or need help, all blood glucose meters have  a 24-hour "hotline" number that you can call. You may also contact your health care provider.  After you use a few boxes of test strips, adjust (calibrate) your blood glucose meter by following instructions that came with your meter. This information is not intended to replace advice given to you by your health care provider. Make sure you discuss any questions you have with your health care provider. Document Released: 05/30/2003 Document Revised: 12/15/2015 Document Reviewed: 11/06/2015 Elsevier Interactive Patient Education  2017 [place]. Diabetes Mellitus  and Food It is important for you to manage your blood sugar (glucose) level. Your blood glucose level can be greatly affected by what you eat. Eating healthier foods in the appropriate amounts throughout the day at about the same time each day will help you control your blood glucose level. It can also help slow or prevent worsening of your diabetes mellitus. Healthy eating may even help you improve the level of your blood pressure and reach or maintain a healthy weight. General recommendations for healthful eating and cooking habits include:  Eating meals and snacks regularly. Avoid going long periods of time without eating to lose weight.  Eating a diet that consists mainly of plant-based foods, such as fruits, vegetables, nuts, legumes, and whole grains.  Using low-heat cooking methods, such as baking, instead of high-heat cooking methods, such as deep frying. Work with your dietitian to make sure you understand how to use the Nutrition Facts information on food labels. How can food affect me? Carbohydrates  Carbohydrates affect your blood glucose level more than any other type of food. Your dietitian will help you determine how many carbohydrates to eat at each meal and teach you how to count carbohydrates. Counting carbohydrates is important to keep your blood glucose at a healthy level, especially if you are using insulin or taking  certain medicines for diabetes mellitus. Alcohol  Alcohol can cause sudden decreases in blood glucose (hypoglycemia), especially if you use insulin or take certain medicines for diabetes mellitus. Hypoglycemia can be a life-threatening condition. Symptoms of hypoglycemia (sleepiness, dizziness, and disorientation) are similar to symptoms of having too much alcohol. If your health care provider has given you approval to drink alcohol, do so in moderation and use the following guidelines:  Women should not have more than one drink per day, and men should not have more than two drinks per day. One drink is equal to:  12 oz of beer.  5 oz of wine.  1 oz of hard liquor.  Do not drink on an empty stomach.  Keep yourself hydrated. Have water, diet soda, or unsweetened iced tea.  Regular soda, juice, and other mixers might contain a lot of carbohydrates and should be counted. What foods are not recommended? As you make food choices, it is important to remember that all foods are not the same. Some foods have fewer nutrients per serving than other foods, even though they might have the same number of calories or carbohydrates. It is difficult to get your body what it needs when you eat foods with fewer nutrients. Examples of foods that you should avoid that are high in calories and carbohydrates but low in nutrients include:  Trans fats (most processed foods list trans fats on the Nutrition Facts label).  Regular soda.  Juice.  Candy.  Sweets, such as cake, pie, doughnuts, and cookies.  Fried foods. What foods can I eat? Eat nutrient-rich foods, which will nourish your body and keep you healthy. The food you should eat also will depend on several factors, including:  The calories you need.  The medicines you take.  Your weight.  Your blood glucose level.  Your blood pressure level.  Your cholesterol level. You should eat a variety of foods, including:  Protein.  Lean cuts of  meat.  Proteins low in saturated fats, such as fish, egg whites, and beans. Avoid processed meats.  Fruits and vegetables.  Fruits and vegetables that may help control blood glucose levels, such as apples, mangoes, and yams.  Dairy products.  Choose fat-free or low-fat dairy products, such as milk, yogurt, and cheese.  Grains, bread, pasta, and rice.  Choose whole grain products, such as multigrain bread, whole oats, and brown rice. These foods may help control blood pressure.  Fats.  Foods containing healthful fats, such as nuts, avocado, olive oil, canola oil, and fish. Does everyone with diabetes mellitus have the same meal plan? Because every person with diabetes mellitus is different, there is not one meal plan that works for everyone. It is very important that you meet with a dietitian who will help you create a meal plan that is just right for you. This information is not intended to replace advice given to you by your health care provider. Make sure you discuss any questions you have with your health care provider. Document Released: 02/21/2005 Document Revised: 11/02/2015 Document Reviewed: 04/23/2013 Elsevier Interactive Patient Education  2017 [employer].

## 2016-08-15 ENCOUNTER — Other Ambulatory Visit: Payer: Self-pay | Admitting: Pharmacist

## 2016-08-15 MED ORDER — HYDRALAZINE HCL 25 MG PO TABS: 25.0000 mg | ORAL_TABLET | Freq: Three times a day (TID) | ORAL | 2 refills | Status: DC

## 2016-08-15 MED FILL — hydrALAZINE HCL 25 MG TABS: 25 | 30 days supply | Qty: 90 | Fill #0

## 2016-08-15 MED FILL — metFORMIN HCL 1000 MG TABS: 1000 | 30 days supply | Qty: 60 | Fill #0

## 2016-08-27 ENCOUNTER — Telehealth: Payer: Self-pay | Admitting: *Deleted

## 2016-08-27 NOTE — Telephone Encounter (Signed)
-----   Message from Tresa Garter, MD sent at 08/21/2016  6:28 PM EDT ----- Please inform the patient that her lab results are normal except high triglyceride, a form of cholesterol. To address this please limit saturated fat to no more than 7% of your calories, limit cholesterol to 200 mg/day, increase fiber and exercise as tolerated.

## 2016-08-27 NOTE — Telephone Encounter (Signed)
Patient is aware of triglyceride level being high and needing to limit her saturated fat and cholesterol intake. Patient advised to increase fiber and exercise as tolerated. Medical Assistant left message on patient's home and cell voicemail. Voicemail states to give a call back to [place] with [facility] at [phone].

## 2016-09-09 MED FILL — METOPROLOL TARTRATE 25 MG T: 25 | 30 days supply | Qty: 30 | Fill #1

## 2016-09-09 MED FILL — AMLODIPINE BESYLATE 10 MG T: 10 | 30 days supply | Qty: 30 | Fill #1

## 2016-09-09 MED FILL — metFORMIN HCL 1000 MG TABS: 1000 | 30 days supply | Qty: 60 | Fill #1

## 2016-09-09 MED FILL — hydrALAZINE HCL 25 MG TABS: 25 | 30 days supply | Qty: 90 | Fill #1

## 2016-09-16 MED FILL — GABAPENTIN 300 MG CAPSULE: 300 | 30 days supply | Qty: 90 | Fill #3

## 2016-09-20 MED FILL — PEG-3350 SOLUTION: 420 | 1 days supply | Qty: 4000 | Fill #0

## 2016-10-02 ENCOUNTER — Encounter: Payer: Self-pay | Admitting: Internal Medicine

## 2016-10-10 MED FILL — AMLODIPINE BESYLATE 10 MG T: 10 | 30 days supply | Qty: 30 | Fill #2

## 2016-10-10 MED FILL — METOPROLOL TARTRATE 25 MG T: 25 | 30 days supply | Qty: 30 | Fill #2

## 2016-10-14 ENCOUNTER — Other Ambulatory Visit: Payer: Self-pay | Admitting: Podiatry

## 2016-10-14 MED FILL — metFORMIN HCL 1000 MG TABS: 1000 | 30 days supply | Qty: 60 | Fill #2

## 2016-10-14 MED FILL — GABAPENTIN 300 MG CAPSULE: 300 | 30 days supply | Qty: 90 | Fill #0

## 2016-10-14 MED FILL — hydrALAZINE HCL 25 MG TABS: 25 | 30 days supply | Qty: 90 | Fill #2

## 2016-10-14 NOTE — Telephone Encounter (Signed)
Pt needs an appt prior to future refills. 

## 2016-11-12 MED FILL — AMLODIPINE BESYLATE 10 MG T: 10 | 30 days supply | Qty: 30 | Fill #3

## 2016-11-12 MED FILL — ATORVASTATIN 20 MG TABLET: 20 | 90 days supply | Qty: 90 | Fill #1

## 2016-11-13 ENCOUNTER — Ambulatory Visit: Payer: Medicaid Other | Attending: Internal Medicine | Admitting: Internal Medicine

## 2016-11-13 ENCOUNTER — Encounter: Payer: Self-pay | Admitting: Internal Medicine

## 2016-11-13 VITALS — BP 122/80 | HR 74 | Temp 98.3°F | Resp 18 | Ht 65.0 in | Wt 222.2 lb

## 2016-11-13 DIAGNOSIS — Z760 Encounter for issue of repeat prescription: Secondary | ICD-10-CM | POA: Diagnosis not present

## 2016-11-13 DIAGNOSIS — Z79899 Other long term (current) drug therapy: Secondary | ICD-10-CM | POA: Insufficient documentation

## 2016-11-13 DIAGNOSIS — Z7984 Long term (current) use of oral hypoglycemic drugs: Secondary | ICD-10-CM | POA: Insufficient documentation

## 2016-11-13 DIAGNOSIS — I10 Essential (primary) hypertension: Secondary | ICD-10-CM

## 2016-11-13 DIAGNOSIS — E119 Type 2 diabetes mellitus without complications: Secondary | ICD-10-CM | POA: Diagnosis present

## 2016-11-13 DIAGNOSIS — E785 Hyperlipidemia, unspecified: Secondary | ICD-10-CM | POA: Diagnosis not present

## 2016-11-13 LAB — POCT GLYCOSYLATED HEMOGLOBIN (HGB A1C): Hemoglobin A1C: 7.3

## 2016-11-13 LAB — POCT UA - MICROALBUMIN
Albumin/Creatinine Ratio, Urine, POC: <30
Creatinine, POC: 100 mg/dL
MICROALBUMIN (UR) POC: 80 mg/L

## 2016-11-13 LAB — GLUCOSE, POCT (MANUAL RESULT ENTRY): POC Glucose: 172 mg/dl — AB (ref 70–99)

## 2016-11-13 MED ORDER — HYDRALAZINE HCL 25 MG PO TABS: 25.0000 mg | ORAL_TABLET | Freq: Three times a day (TID) | ORAL | 2 refills | Status: DC

## 2016-11-13 MED ORDER — GABAPENTIN 300 MG PO CAPS: 300.0000 mg | ORAL_CAPSULE | Freq: Three times a day (TID) | ORAL | 0 refills | Status: DC

## 2016-11-13 MED ORDER — ATORVASTATIN CALCIUM 20 MG PO TABS: 20.0000 mg | ORAL_TABLET | Freq: Every day | ORAL | 3 refills | Status: DC

## 2016-11-13 MED ORDER — LISINOPRIL-HYDROCHLOROTHIAZIDE 10-12.5 MG PO TABS: 1.0000 | ORAL_TABLET | Freq: Every day | ORAL | 3 refills | Status: DC

## 2016-11-13 MED ORDER — AMLODIPINE BESYLATE 10 MG PO TABS: 10.0000 mg | ORAL_TABLET | Freq: Every day | ORAL | 3 refills | Status: DC

## 2016-11-13 MED ORDER — METFORMIN HCL 1000 MG PO TABS: 1000.0000 mg | ORAL_TABLET | Freq: Two times a day (BID) | ORAL | 3 refills | Status: DC

## 2016-11-13 MED ORDER — METOPROLOL TARTRATE 25 MG PO TABS: ORAL_TABLET | ORAL | 3 refills | Status: DC

## 2016-11-13 MED FILL — METOPROLOL TARTRATE 25 MG T: 25 | 30 days supply | Qty: 30 | Fill #0

## 2016-11-13 MED FILL — metFORMIN HCL 1000 MG TABS: 1000 | 30 days supply | Qty: 60 | Fill #0

## 2016-11-13 MED FILL — GABAPENTIN 300 MG CAPSULE: 300 | 30 days supply | Qty: 90 | Fill #0

## 2016-11-13 MED FILL — hydrALAZINE HCL 25 MG TABS: 25 | 30 days supply | Qty: 90 | Fill #0

## 2016-11-13 MED FILL — LISINOPRIL-HCTZ 10-12.5 MG: 10-12.5 | 30 days supply | Qty: 30 | Fill #0

## 2016-11-13 NOTE — Patient Instructions (Signed)
Diabetes Mellitus and Exercise Exercising regularly is important for your overall health, especially when you have diabetes (diabetes mellitus). Exercising is not only about losing weight. It has many health benefits, such as increasing muscle strength and bone density and reducing body fat and stress. This leads to improved fitness, flexibility, and endurance, all of which result in better overall health. Exercise has additional benefits for people with diabetes, including:  Reducing appetite.  Helping to lower and control blood glucose.  Lowering blood pressure.  Helping to control amounts of fatty substances (lipids) in the blood, such as cholesterol and triglycerides.  Helping the body to respond better to insulin (improving insulin sensitivity).  Reducing how much insulin the body needs.  Decreasing the risk for heart disease by: ? Lowering cholesterol and triglyceride levels. ? Increasing the levels of good cholesterol. ? Lowering blood glucose levels.  What is my activity plan? Your health care provider or certified diabetes educator can help you make a plan for the type and frequency of exercise (activity plan) that works for you. Make sure that you:  Do at least 150 minutes of moderate-intensity or vigorous-intensity exercise each week. This could be brisk walking, biking, or water aerobics. ? Do stretching and strength exercises, such as yoga or weightlifting, at least 2 times a week. ? Spread out your activity over at least 3 days of the week.  Get some form of physical activity every day. ? Do not go more than 2 days in a row without some kind of physical activity. ? Avoid being inactive for more than 90 minutes at a time. Take frequent breaks to walk or stretch.  Choose a type of exercise or activity that you enjoy, and set realistic goals.  Start slowly, and gradually increase the intensity of your exercise over time.  What do I need to know about managing my  diabetes?  Check your blood glucose before and after exercising. ? If your blood glucose is higher than 240 mg/dL (13.3 mmol/L) before you exercise, check your urine for ketones. If you have ketones in your urine, do not exercise until your blood glucose returns to normal.  Know the symptoms of low blood glucose (hypoglycemia) and how to treat it. Your risk for hypoglycemia increases during and after exercise. Common symptoms of hypoglycemia can include: ? Hunger. ? Anxiety. ? Sweating and feeling clammy. ? Confusion. ? Dizziness or feeling light-headed. ? Increased heart rate or palpitations. ? Blurry vision. ? Tingling or numbness around the mouth, lips, or tongue. ? Tremors or shakes. ? Irritability.  Keep a rapid-acting carbohydrate snack available before, during, and after exercise to help prevent or treat hypoglycemia.  Avoid injecting insulin into areas of the body that are going to be exercised. For example, avoid injecting insulin into: ? The arms, when playing tennis. ? The legs, when jogging.  Keep records of your exercise habits. Doing this can help you and your health care provider adjust your diabetes management plan as needed. Write down: ? Food that you eat before and after you exercise. ? Blood glucose levels before and after you exercise. ? The type and amount of exercise you have done. ? When your insulin is expected to peak, if you use insulin. Avoid exercising at times when your insulin is peaking.  When you start a new exercise or activity, work with your health care provider to make sure the activity is safe for you, and to adjust your insulin, medicines, or food intake as needed.    Drink plenty of water while you exercise to prevent dehydration or heat stroke. Drink enough fluid to keep your urine clear or pale yellow. This information is not intended to replace advice given to you by your health care provider. Make sure you discuss any questions you have with  your health care provider. Document Released: 08/17/2003 Document Revised: 12/15/2015 Document Reviewed: 11/06/2015 Elsevier Interactive Patient Education  2018 [employer] Blood Glucose Monitoring, Adult Monitoring your blood sugar (glucose) helps you manage your diabetes. It also helps you and your health care provider determine how well your diabetes management plan is working. Blood glucose monitoring involves checking your blood glucose as often as directed, and keeping a record (log) of your results over time. Why should I monitor my blood glucose? Checking your blood glucose regularly can:  Help you understand how food, exercise, illnesses, and medicines affect your blood glucose.  Let you know what your blood glucose is at any time. You can quickly tell if you are having low blood glucose (hypoglycemia) or high blood glucose (hyperglycemia).  Help you and your health care provider adjust your medicines as needed.  When should I check my blood glucose? Follow instructions from your health care provider about how often to check your blood glucose. This may depend on:  The type of diabetes you have.  How well-controlled your diabetes is.  Medicines you are taking.  If you have type 1 diabetes:  Check your blood glucose at least 2 times a day.  Also check your blood glucose: ? Before every insulin injection. ? Before and after exercise. ? Between meals. ? 2 hours after a meal. ? Occasionally between 2:00 a.m. and 3:00 a.m., as directed. ? Before potentially dangerous tasks, like driving or using heavy machinery. ? At bedtime.  You may need to check your blood glucose more often, up to 6-10 times a day: ? If you use an insulin pump. ? If you need multiple daily injections (MDI). ? If your diabetes is not well-controlled. ? If you are ill. ? If you have a history of severe hypoglycemia. ? If you have a history of not knowing when your blood glucose is getting low  (hypoglycemia unawareness). If you have type 2 diabetes:  If you take insulin or other diabetes medicines, check your blood glucose at least 2 times a day.  If you are on intensive insulin therapy, check your blood glucose at least 4 times a day. Occasionally, you may also need to check between 2:00 a.m. and 3:00 a.m., as directed.  Also check your blood glucose: ? Before and after exercise. ? Before potentially dangerous tasks, like driving or using heavy machinery.  You may need to check your blood glucose more often if: ? Your medicine is being adjusted. ? Your diabetes is not well-controlled. ? You are ill. What is a blood glucose log?  A blood glucose log is a record of your blood glucose readings. It helps you and your health care provider: ? Look for patterns in your blood glucose over time. ? Adjust your diabetes management plan as needed.  Every time you check your blood glucose, write down your result and notes about things that may be affecting your blood glucose, such as your diet and exercise for the day.  Most glucose meters store a record of glucose readings in the meter. Some meters allow you to download your records to a computer. How do I check my blood glucose? Follow these steps to get accurate   readings of your blood glucose: Supplies needed   Blood glucose meter.  Test strips for your meter. Each meter has its own strips. You must use the strips that come with your meter.  A needle to prick your finger (lancet). Do not use lancets more than once.  A device that holds the lancet (lancing device).  A journal or log book to write down your results. Procedure  Wash your hands with soap and water.  Prick the side of your finger (not the tip) with the lancet. Use a different finger each time.  Gently rub the finger until a small drop of blood appears.  Follow instructions that come with your meter for inserting the test strip, applying blood to the strip,  and using your blood glucose meter.  Write down your result and any notes. Alternative testing sites  Some meters allow you to use areas of your body other than your finger (alternative sites) to test your blood.  If you think you may have hypoglycemia, or if you have hypoglycemia unawareness, do not use alternative sites. Use your finger instead.  Alternative sites may not be as accurate as the fingers, because blood flow is slower in these areas. This means that the result you get may be delayed, and it may be different from the result that you would get from your finger.  The most common alternative sites are: ? Forearm. ? Thigh. ? Palm of the hand. Additional tips  Always keep your supplies with you.  If you have questions or need help, all blood glucose meters have a 24-hour "hotline" number that you can call. You may also contact your health care provider.  After you use a few boxes of test strips, adjust (calibrate) your blood glucose meter by following instructions that came with your meter. This information is not intended to replace advice given to you by your health care provider. Make sure you discuss any questions you have with your health care provider. Document Released: 05/30/2003 Document Revised: 12/15/2015 Document Reviewed: 11/06/2015 Elsevier Interactive Patient Education  2017 [employer] Inc[employer]  

## 2016-11-13 NOTE — Progress Notes (Signed)
Sarah [name], is a 51 y.o. female  TFT:[id number]  HCW:[id number]  DOB - 1965-08-31  Chief Complaint  Patient presents with  . Diabetes  . Hypertension       Subjective:   Sarah [name] is a 51 y.o. female with history of hypertension, Diabetes, hyperlipidemia andstage IIIc low-grade serous carcinoma of the ovaryS/p BSO, omentectomy, appendectomy on 07/28/14. S/p adjuvant chemotherapy with 6 cycles of paclitaxel and carboplatin completed on 12/08/14. S/p an ex lap, hernia repair and small bowel resection for a presumed recurrence (benign pathology) here today for a follow up visit and medication refill. Patient is doing well, has appointment with oncologist next week. No bleeding from any orifice. Had colonoscopy in April, normal colon, only external hemorrhoid seen. Patient has joined a gym. Patient has No headache, No chest pain, No abdominal pain - No Nausea, No new weakness tingling or numbness, No Cough - SOB. Her blood pressure is currently controlled, she denies chest pain or shortness of breath. Her blood sugar is control on Metformin.  No problems updated.  ALLERGIES: Allergies  Allergen Reactions  . Emend [Aprepitant] Other (See Comments)    Urticaria     PAST MEDICAL HISTORY: Past Medical History:  Diagnosis Date  . Arthritis   . Family history of colon cancer   . Glaucoma 02/16  . Glucagonoma 07/28/14   Pt denies this but states she has glaucoma  . History of chemotherapy   . Hypertension   . Imbalance   . Neuropathy    feet bilat  . Nocturia   . Peritoneal carcinomatosis ([place])    carcinoma of ovary   . Shortness of breath dyspnea    hx of 2013 - no problems currently   . Thyroid nodule    history of   . Wears glasses     MEDICATIONS AT HOME: Prior to Admission medications   Medication Sig Start Date End Date Taking? Authorizing Provider  ACCU-CHEK SMARTVIEW test strip TID and a bedtime. E11.9 02/08/16  Yes Tajanae Guilbault E, MD    acetaminophen-codeine (TYLENOL #3) 300-30 MG tablet Take 1 tablet by mouth every 4 (four) hours as needed. 10/26/15  Yes Tresa Garter, MD  amLODipine (NORVASC) 10 MG tablet Take 1 tablet (10 mg total) by mouth daily. 11/13/16  Yes Tresa Garter, MD  atorvastatin (LIPITOR) 20 MG tablet Take 1 tablet (20 mg total) by mouth daily. 11/13/16  Yes Tresa Garter, MD  blood glucose meter kit and supplies Dispense based on patient and insurance preference. Use up to four times daily as directed. (FOR ICD-9 250.00, 250.01). 07/12/15  Yes Cross, Melissa D, NP  gabapentin (NEURONTIN) 300 MG capsule Take 1 capsule (300 mg total) by mouth 3 (three) times daily. 11/13/16  Yes Tresa Garter, MD  hydrALAZINE (APRESOLINE) 25 MG tablet Take 1 tablet (25 mg total) by mouth 3 (three) times daily. 11/13/16  Yes Tresa Garter, MD  ibuprofen (ADVIL,MOTRIN) 400 MG tablet Take 1 tablet (400 mg total) by mouth every 8 (eight) hours as needed. 05/20/16  Yes Fredia Beets R, FNP  Insulin Pen Needle 31G X 5 MM MISC For use with lantus pen for injections once daily 07/12/15  Yes Cross, Melissa D, NP  Lancets (ACCU-CHEK MULTICLIX) lancets  07/12/15  Yes [provider]  latanoprost (XALATAN) 0.005 % ophthalmic solution Place 1 drop into both eyes at bedtime.   Yes [provider]  lisinopril-hydrochlorothiazide (PRINZIDE,ZESTORETIC) 10-12.5 MG tablet Take 1 tablet by mouth daily.  11/13/16  Yes Tresa Garter, MD  metFORMIN (GLUCOPHAGE) 1000 MG tablet Take 1 tablet (1,000 mg total) by mouth 2 (two) times daily with a meal. 11/13/16  Yes Linnaea Ahn E, MD  metoprolol tartrate (LOPRESSOR) 25 MG tablet TAKE 1/2 TABLET BY MOUTH 2 TIMES A DAY 11/13/16  Yes Dairon Procter E, MD  timolol (TIMOPTIC) 0.5 % ophthalmic solution Place 1 drop into both eyes daily.   Yes [provider]    Objective:   Vitals:   11/13/16 0926  BP: 122/80  Pulse: 74  Resp: 18  Temp: 98.3 F  (36.8 C)  TempSrc: Oral  SpO2: 98%  Weight: 222 lb 3.2 oz (100.8 kg)  Height: _0  (1.651 m)   Exam General appearance : Awake, alert, not in any distress. Speech Clear. Not toxic looking, obese HEENT: Atraumatic and Normocephalic, pupils equally reactive to light and accomodation Neck: Supple, no JVD. No cervical lymphadenopathy.  Chest: Good air entry bilaterally, no added sounds  CVS: S1 S2 regular, no murmurs.  Abdomen: Bowel sounds present, Non tender and not distended with no gaurding, rigidity or rebound. Extremities: B/L Lower Ext shows no edema, both legs are warm to touch Neurology: Awake alert, and oriented X 3, CN II-XII intact, Non focal  Data Review Lab Results  Component Value Date   HGBA1C 7.3 11/13/2016   HGBA1C 7.3 08/14/2016   HGBA1C 5.7 02/22/2016    Assessment & Plan   1. Type 2 diabetes mellitus without complication, without long-term current use of insulin (HCC)  - POCT A1C - POCT UA - Microalbumin - Glucose (CBG)  - metFORMIN (GLUCOPHAGE) 1000 MG tablet; Take 1 tablet (1,000 mg total) by mouth 2 (two) times daily with a meal.  Dispense: 180 tablet; Refill: 3 - gabapentin (NEURONTIN) 300 MG capsule; Take 1 capsule (300 mg total) by mouth 3 (three) times daily.  Dispense: 90 capsule; Refill: 0  2. Essential hypertension  - amLODipine (NORVASC) 10 MG tablet; Take 1 tablet (10 mg total) by mouth daily.  Dispense: 90 tablet; Refill: 3 - hydrALAZINE (APRESOLINE) 25 MG tablet; Take 1 tablet (25 mg total) by mouth 3 (three) times daily.  Dispense: 90 tablet; Refill: 2 - lisinopril-hydrochlorothiazide (PRINZIDE,ZESTORETIC) 10-12.5 MG tablet; Take 1 tablet by mouth daily.  Dispense: 90 tablet; Refill: 3 - metoprolol tartrate (LOPRESSOR) 25 MG tablet; TAKE 1/2 TABLET BY MOUTH 2 TIMES A DAY  Dispense: 90 tablet; Refill: 3  3. Dyslipidemia  - atorvastatin (LIPITOR) 20 MG tablet; Take 1 tablet (20 mg total) by mouth daily.  Dispense: 90 tablet; Refill:  3  Patient have been counseled extensively about nutrition and exercise. Other issues discussed during this visit include: low cholesterol diet, weight control and daily exercise, foot care, annual eye examinations at Ophthalmology, importance of adherence with medications and regular follow-up. We also discussed long term complications of uncontrolled diabetes and hypertension.   Return in about 3 months (around 02/13/2017) for Hemoglobin A1C and Follow up, DM, Follow up HTN, Follow up Pain and comorbidities.  The patient was given clear instructions to go to ER or return to medical center if symptoms don't improve, worsen or new problems develop. The patient verbalized understanding. The patient was told to call to get lab results if they haven't heard anything in the next week.   This note has been created with Surveyor, quantity. Any transcriptional errors are unintentional.    Taccara Bushnell, MD, MHA, FACP, FAAP, [facility] [place], [place]   11/13/2016, 10:41 AM

## 2016-11-13 NOTE — Progress Notes (Signed)
Patient is here for FU DM/HTN  Patient denies pain at this time.  Patient has taken medication today. Patient has not eaten today.

## 2016-11-18 ENCOUNTER — Telehealth: Payer: Self-pay | Admitting: Internal Medicine

## 2016-11-20 ENCOUNTER — Other Ambulatory Visit (HOSPITAL_BASED_OUTPATIENT_CLINIC_OR_DEPARTMENT_OTHER): Payer: Medicaid Other

## 2016-11-20 ENCOUNTER — Encounter: Payer: Self-pay | Admitting: Gynecologic Oncology

## 2016-11-20 ENCOUNTER — Ambulatory Visit: Payer: Medicaid Other | Attending: Gynecologic Oncology | Admitting: Gynecologic Oncology

## 2016-11-20 VITALS — BP 146/79 | HR 72 | Temp 97.8°F | Resp 20 | Wt 221.0 lb

## 2016-11-20 DIAGNOSIS — K439 Ventral hernia without obstruction or gangrene: Secondary | ICD-10-CM | POA: Insufficient documentation

## 2016-11-20 DIAGNOSIS — Z888 Allergy status to other drugs, medicaments and biological substances status: Secondary | ICD-10-CM | POA: Insufficient documentation

## 2016-11-20 DIAGNOSIS — C561 Malignant neoplasm of right ovary: Secondary | ICD-10-CM

## 2016-11-20 DIAGNOSIS — Z794 Long term (current) use of insulin: Secondary | ICD-10-CM | POA: Diagnosis not present

## 2016-11-20 DIAGNOSIS — Z90722 Acquired absence of ovaries, bilateral: Secondary | ICD-10-CM | POA: Diagnosis not present

## 2016-11-20 DIAGNOSIS — Z8543 Personal history of malignant neoplasm of ovary: Secondary | ICD-10-CM | POA: Insufficient documentation

## 2016-11-20 DIAGNOSIS — H409 Unspecified glaucoma: Secondary | ICD-10-CM | POA: Insufficient documentation

## 2016-11-20 DIAGNOSIS — Z808 Family history of malignant neoplasm of other organs or systems: Secondary | ICD-10-CM | POA: Insufficient documentation

## 2016-11-20 DIAGNOSIS — E041 Nontoxic single thyroid nodule: Secondary | ICD-10-CM | POA: Diagnosis not present

## 2016-11-20 DIAGNOSIS — Z9049 Acquired absence of other specified parts of digestive tract: Secondary | ICD-10-CM | POA: Diagnosis not present

## 2016-11-20 DIAGNOSIS — I10 Essential (primary) hypertension: Secondary | ICD-10-CM | POA: Insufficient documentation

## 2016-11-20 DIAGNOSIS — Z800 Family history of malignant neoplasm of digestive organs: Secondary | ICD-10-CM | POA: Insufficient documentation

## 2016-11-20 DIAGNOSIS — Z90710 Acquired absence of both cervix and uterus: Secondary | ICD-10-CM | POA: Insufficient documentation

## 2016-11-20 DIAGNOSIS — Z8042 Family history of malignant neoplasm of prostate: Secondary | ICD-10-CM | POA: Diagnosis not present

## 2016-11-20 DIAGNOSIS — M1990 Unspecified osteoarthritis, unspecified site: Secondary | ICD-10-CM | POA: Diagnosis not present

## 2016-11-20 DIAGNOSIS — Z9221 Personal history of antineoplastic chemotherapy: Secondary | ICD-10-CM | POA: Diagnosis not present

## 2016-11-20 DIAGNOSIS — E119 Type 2 diabetes mellitus without complications: Secondary | ICD-10-CM | POA: Diagnosis not present

## 2016-11-20 DIAGNOSIS — Z809 Family history of malignant neoplasm, unspecified: Secondary | ICD-10-CM | POA: Diagnosis not present

## 2016-11-20 DIAGNOSIS — Z9889 Other specified postprocedural states: Secondary | ICD-10-CM | POA: Insufficient documentation

## 2016-11-20 DIAGNOSIS — Z833 Family history of diabetes mellitus: Secondary | ICD-10-CM | POA: Diagnosis not present

## 2016-11-20 DIAGNOSIS — Z8249 Family history of ischemic heart disease and other diseases of the circulatory system: Secondary | ICD-10-CM | POA: Insufficient documentation

## 2016-11-20 DIAGNOSIS — R1013 Epigastric pain: Secondary | ICD-10-CM | POA: Diagnosis not present

## 2016-11-20 DIAGNOSIS — R109 Unspecified abdominal pain: Secondary | ICD-10-CM | POA: Insufficient documentation

## 2016-11-20 DIAGNOSIS — E1140 Type 2 diabetes mellitus with diabetic neuropathy, unspecified: Secondary | ICD-10-CM | POA: Insufficient documentation

## 2016-11-20 LAB — BUN AND CREATININE (CC13)
BUN: 19.1 mg/dL (ref 7.0–26.0)
CREATININE: 1.1 mg/dL (ref 0.6–1.1)
EGFR: 69 mL/min/{1.73_m2} — AB (ref >90)

## 2016-11-20 MED FILL — ACCU-CHEK SMARTVIEW STRIP: 25 days supply | Qty: 100 | Fill #3

## 2016-11-20 NOTE — Progress Notes (Signed)
POST-TREATMENT FOLLOWUP. TREATMENT COUNSELING.  Assessment:   51 y.o. 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   S/p BSO, omentectomy, appendectomy on 07/28/14. S/p adjuvant chemotherapy with 6 cycles of paclitaxel and carboplatin completed on 12/08/14. S/p an ex lap, hernia repair and small bowel resection for a presumed recurrence (benign pathology). Recurrent ventral hernia   No evidence of recurrent disease on exam, however, given increase in abdominal pain, recommend check CA 125 today and CT abdo/pelvis.   Plan: 1) Recurrent ventral hernia: If no apparent recurrence, recommended she discuss her epigastric discomfort with [name] as this may be associated with her recurrent hernia. 2) Treatment counseling -  I recommend continuing routine surveillance with visit in 6 months with CA 125 assessments (CT scans on a prn basis for symptoms or rise in CA 125). I will see her back in December, 2018. 3) newly diagnosed type II DM- patient is on active therapy with insulin and oral medical therapy.  HPI:  Sarah [name] is a 51 y.o. year old initially seen in consultation on 07/08/14 for bilateral ovarian masses and ascites. This was diagnosed at the time of admission to the hospital for hypertensive crisis. This diagnosis of hypertension was new and she was newly started on a medical regimen for this. She then underwent an exploratory laparotomy, BSO, omentectomy, appendectomy on 07/28/14 with Dr Skeet Latch without complications.  Her postoperative course was uncomplicated.  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 This represented a stage IIIc disease process.  She received 6 cycles of adjuvant chemotherapy with paclitaxel and carboplatin with Dr Marko Plume on 08/25/14 to 12/08/14.  Pretreatment CA 125 was 58 on 06/30/14, and was 20 on 12/22/14 (it's nadir had been 16 on  09/22/14).  Post treatment CT of the chest, abdomen and pelvis on 01/12/15 revealed: Previously noted paratracheal, subcarinal and prevascular mediastinal lymphadenopathy has resolved, with no residual pathologically enlarged mediastinal lymph nodes. Resolved hilar lymphadenopathy, with no residual pathologically enlarged mediastinal nodes. The previously described 3 mm subpleural left lower lobe pulmonary nodule is decreased to 2 mm (series 4/image 41). A separate 2 mm subpleural left lower lobe pulmonary nodule (4/29) is slightly decreased from 13m on 06/28/2014. A subpleural 3 mm right upper lobe pulmonary nodule (4/15) is unchanged from 06/28/2014. A 2 mm subpleural pulmonary nodule in the superior segment right lower lobe associated with the right major fissure (4/21) appears new. A 0.7 cm right posterior mesenteric node measures 0.7 cm short axis (series 2/image 93), decreased from 1.2 cm. No definite peritoneal tumor implants detected. A 0.4 cm nodule anterior/inferior to the spleen (series 2/ image 60) is mildly decreased from 0.7 cm. Small umbilical hernia containing a small bowel loop, with no evidence of small-bowel obstruction or strangulation.  She had a CA 125 on 03/27/15 that was normal and stable at 14.  A CT of the chest, abdomen and pelvis was performed on 04/27/15 which showed: No pulmonary mass, infiltrate, or effusion. Tiny less than 5 mm subpleural pulmonary nodules in the right upper lobe on image remain stable. No new or enlarging pulmonary nodules identified. Small left paraventral hernia containing bowel loops. New soft tissue nodularity is seen within the anterior abdominal mesenteric fat, with largest nodule measuring 10 x 17 mm. No other sites of peritoneal nodularity or soft tissue density seen.   On 07/06/15 she was taken to the OR for an ex  lap, lysis of adhesions, and small bowel resection with ventral hernia repair. FIndings were remarkable for substantial small bowel  adhesions, hwoever the mass that had been seen on imaging was not appreciated intraperitoneally. There was white nodularity and stranding on the mesentery of a segment of small bowel. It was completely resected with the small bowel resection. Pathology was benign.  Postoperatively she was noted to have very high blood glucose on acuchecks. An HbA1C was drawn on 07/07/15 and was very elevated at 10.1%. She was started on Insulin.  Her last CA 125 was 15 on 01/22/16 and 20.2 on 04/22/16 and 16.7 on 07/22/16.  Interval Hx:  She has had symptomatic pains in her ventral hernia. Worse with sneezing and straining (working out).  Current Outpatient Prescriptions on File Prior to Visit  Medication Sig Dispense Refill  . ACCU-CHEK SMARTVIEW test strip TID and a bedtime. E11.9 100 each 12  . acetaminophen-codeine (TYLENOL #3) 300-30 MG tablet Take 1 tablet by mouth every 4 (four) hours as needed. 60 tablet 0  . amLODipine (NORVASC) 10 MG tablet Take 1 tablet (10 mg total) by mouth daily. 90 tablet 3  . atorvastatin (LIPITOR) 20 MG tablet Take 1 tablet (20 mg total) by mouth daily. 90 tablet 3  . blood glucose meter kit and supplies Dispense based on patient and insurance preference. Use up to four times daily as directed. (FOR ICD-9 250.00, 250.01). 1 each 0  . gabapentin (NEURONTIN) 300 MG capsule Take 1 capsule (300 mg total) by mouth 3 (three) times daily. 90 capsule 0  . hydrALAZINE (APRESOLINE) 25 MG tablet Take 1 tablet (25 mg total) by mouth 3 (three) times daily. 90 tablet 2  . ibuprofen (ADVIL,MOTRIN) 400 MG tablet Take 1 tablet (400 mg total) by mouth every 8 (eight) hours as needed. 24 tablet 0  . Insulin Pen Needle 31G X 5 MM MISC For use with lantus pen for injections once daily 50 each 10  . Lancets (ACCU-CHEK MULTICLIX) lancets   0  . latanoprost (XALATAN) 0.005 % ophthalmic solution Place 1 drop into both eyes at bedtime.    Marland Kitchen lisinopril-hydrochlorothiazide (PRINZIDE,ZESTORETIC) 10-12.5 MG  tablet Take 1 tablet by mouth daily. 90 tablet 3  . metFORMIN (GLUCOPHAGE) 1000 MG tablet Take 1 tablet (1,000 mg total) by mouth 2 (two) times daily with a meal. 180 tablet 3  . metoprolol tartrate (LOPRESSOR) 25 MG tablet TAKE 1/2 TABLET BY MOUTH 2 TIMES A DAY 90 tablet 3  . timolol (TIMOPTIC) 0.5 % ophthalmic solution Place 1 drop into both eyes daily.     No current facility-administered medications on file prior to visit.    Allergies  Allergen Reactions  . Emend [Aprepitant] Other (See Comments)    Urticaria    Past Medical History:  Diagnosis Date  . Arthritis   . Family history of colon cancer   . Glaucoma 02/16  . Glucagonoma 07/28/14   Pt denies this but states she has glaucoma  . History of chemotherapy   . Hypertension   . Imbalance   . Neuropathy    feet bilat  . Nocturia   . Peritoneal carcinomatosis ([place])    carcinoma of ovary   . Shortness of breath dyspnea    hx of 2013 - no problems currently   . Thyroid nodule    history of   . Wears glasses    Past Surgical History:  Procedure Laterality Date  . ABDOMINAL HYSTERECTOMY    . [place]  N/A 07/06/2015   Procedure: INCISIONAL HERNIA REPAIR ;  Surgeon: Rolm Bookbinder, MD;  Location: [facility];  Service: General;  Laterality: N/A;  . INSERTION OF MESH N/A 07/06/2015   Procedure: WITH INSERTION OF PHASIX ST MESH;  Surgeon: Rolm Bookbinder, MD;  Location: [facility];  Service: General;  Laterality: N/A;  . LAPAROTOMY N/A 07/06/2015   Procedure: EXPLORATORY LAPAROTOMY;  Surgeon: Everitt Amber, MD;  Location: [facility];  Service: Gynecology;  Laterality: N/A;  . LYSIS OF ADHESION N/A 07/06/2015   Procedure:  LYSIS OF ADHESION RESECTION OF MESENTERIC MASS BOWEL RESECTION ;  Surgeon: Everitt Amber, MD;  Location: [facility];  Service: Gynecology;  Laterality: N/A;  . ROBOTIC ASSISTED TOTAL HYSTERECTOMY WITH BILATERAL SALPINGO OOPHERECTOMY Bilateral 07/28/2014   Procedure: ROBOTIC ASSISTED lysis of adhesions with  biopsies, converted to LAPAROTOMY, bilateral salpingoorphorectomy, omentectomy,appendectomy;  Surgeon: Janie Morning, MD;  Location: [facility];  Service: Gynecology;  Laterality: Bilateral;  . THYROIDECTOMY, PARTIAL     Family History  Problem Relation Age of Onset  . Hypertension Mother   . Hypertension Father   . Diabetes Father   . Cancer Sister 50       fibrosarcoma (back); currently 39  . Prostate cancer Maternal Uncle   . Colon cancer Paternal Aunt        Dx 72s; deceased 60s  . Prostate cancer Paternal Uncle        currently 65  . Cancer Paternal Uncle 70       "bone" ; unk. primary  . Stomach cancer Paternal Uncle    Social History   Social History  . Marital status: Married    Spouse name: N/A  . Number of children: N/A  . Years of education: N/A   Occupational History  . Not on file.   Social History Main Topics  . Smoking status: Never Smoker  . Smokeless tobacco: Never Used  . Alcohol use No  . Drug use: No  . Sexual activity: Not on file   Other Topics Concern  . Not on file   Social History Narrative  . No narrative on file    Review of systems: Constitutional:  She has no weight gain or weight loss. She has no fever or chills. Eyes: No blurred vision Ears, Nose, Mouth, Throat: No dizziness, headaches or changes in hearing. No mouth sores. Cardiovascular: No chest pain, palpitations or edema. Respiratory:  No shortness of breath, wheezing or cough Gastrointestinal: She has normal bowel movements without diarrhea or constipation. She denies any nausea or vomiting. She denies blood in her stool or heart burn. Abdominal pain Genitourinary:  She denies pelvic pain, pelvic pressure or changes in her urinary function. She has no hematuria, dysuria, or incontinence. She has no irregular vaginal bleeding or vaginal discharge Musculoskeletal: Denies muscle weakness or joint pains.  Skin:  She has no skin changes, rashes or itching Neurological:  Denies  dizziness or headaches. + bilateral foot neuropathy Psychiatric:  She denies depression or anxiety. Hematologic/Lymphatic:   No easy bruising or bleeding   Physical Exam: Blood pressure (!) 146/79, pulse 72, temperature 97.8 F (36.6 C), temperature source Oral, resp. rate 20, weight 221 lb (100.2 kg), SpO2 98 %. General: Well dressed, well nourished in no apparent distress.   HEENT:  Normocephalic and atraumatic, no lesions.  Extraocular muscles intact. Sclerae anicteric. Pupils equal, round, reactive. No mouth sores or ulcers. Thyroid is normal size, not nodular, midline. Abdomen:  Soft, nontender, nondistended.  No palpable masses.  No hepatosplenomegaly.  No ascites. Normal bowel sounds. Incision is healed. 10cm soft, reducible midline upper abdominal ventral hernia. No erythema. Not tender to palpate. Genitourinary: No pelvic masses. Extremities: No cyanosis, clubbing or edema.  No calf tenderness or erythema. No palpable cords. Psychiatric: Mood and affect are appropriate. Neurological: Awake, alert and oriented x 3. Sensation is intact, no neuropathy.  Musculoskeletal: No pain, normal strength and range of motion.  Donaciano Eva, MD

## 2016-11-20 NOTE — Patient Instructions (Signed)
Please follow-up with Dr [name] as scheduled in 6 months.  Please call Dr Cristal Generous office to follow-up regarding abdominal pain and your hernia (ph [phone])

## 2016-11-21 ENCOUNTER — Telehealth: Payer: Self-pay

## 2016-11-21 LAB — CA 125: CANCER ANTIGEN (CA) 125: 16.5 U/mL (ref 0.0–38.1)

## 2016-11-21 NOTE — Telephone Encounter (Signed)
-----   Message from Dorothyann Gibbs, NP sent at 11/21/2016 12:13 PM EDT ----- Please let her know her CA 125 is normal and stable

## 2016-11-21 NOTE — Telephone Encounter (Signed)
Told Sarah [name] the results of the Ca-125 from 11-20-16 as noted below by [facility]  Sarah [name] stated that she called Dr. Cristal Generous office and scheduled an appointment for 12-13-16.

## 2016-11-25 ENCOUNTER — Encounter (HOSPITAL_COMMUNITY): Payer: Self-pay

## 2016-11-25 ENCOUNTER — Telehealth: Payer: Self-pay | Admitting: Gynecologic Oncology

## 2016-11-25 ENCOUNTER — Ambulatory Visit (HOSPITAL_COMMUNITY)
Admission: RE | Admit: 2016-11-25 | Discharge: 2016-11-25 | Disposition: A | Discharge status: Home / Self Care | Payer: Medicaid Other | Source: Ambulatory Visit | Attending: Gynecologic Oncology | Admitting: Gynecologic Oncology

## 2016-11-25 DIAGNOSIS — I700 Atherosclerosis of aorta: Secondary | ICD-10-CM | POA: Diagnosis not present

## 2016-11-25 DIAGNOSIS — C561 Malignant neoplasm of right ovary: Secondary | ICD-10-CM | POA: Diagnosis not present

## 2016-11-25 DIAGNOSIS — K760 Fatty (change of) liver, not elsewhere classified: Secondary | ICD-10-CM | POA: Diagnosis not present

## 2016-11-25 DIAGNOSIS — K439 Ventral hernia without obstruction or gangrene: Secondary | ICD-10-CM | POA: Diagnosis not present

## 2016-11-25 MED ORDER — IOPAMIDOL (ISOVUE-300) INJECTION 61%
100.0000 mL | Freq: Once | INTRAVENOUS | Status: AC | PRN
  Administered 2016-11-25: 100 mL via INTRAVENOUS

## 2016-11-25 MED ORDER — IOPAMIDOL (ISOVUE-300) INJECTION 61%
INTRAVENOUS | Status: AC
  Filled 2016-11-25: qty 100

## 2016-11-25 NOTE — Telephone Encounter (Signed)
Spoke with patient about CT scan results.  No concerns voiced.  Appt with Dr. Donne Hazel on July 6.  Advised to call for any needs or concerns.

## 2016-12-12 MED FILL — AMLODIPINE BESYLATE 10 MG T: 10 | 30 days supply | Qty: 30 | Fill #4

## 2016-12-12 MED FILL — METOPROLOL TARTRATE 25 MG T: 25 | 30 days supply | Qty: 30 | Fill #1

## 2016-12-12 MED FILL — hydrALAZINE HCL 25 MG TABS: 25 | 30 days supply | Qty: 90 | Fill #1

## 2016-12-13 DIAGNOSIS — K432 Incisional hernia without obstruction or gangrene: Secondary | ICD-10-CM | POA: Diagnosis not present

## 2016-12-16 ENCOUNTER — Other Ambulatory Visit: Payer: Self-pay | Admitting: Internal Medicine

## 2016-12-16 DIAGNOSIS — I10 Essential (primary) hypertension: Secondary | ICD-10-CM

## 2016-12-16 MED FILL — GABAPENTIN 300 MG CAPSULE: 300 | 30 days supply | Qty: 90 | Fill #0

## 2016-12-16 MED FILL — metFORMIN HCL 1000 MG TABS: 1000 | 30 days supply | Qty: 60 | Fill #1

## 2017-01-13 ENCOUNTER — Other Ambulatory Visit: Payer: Self-pay | Admitting: Internal Medicine

## 2017-01-13 DIAGNOSIS — I10 Essential (primary) hypertension: Secondary | ICD-10-CM

## 2017-01-13 MED FILL — AMLODIPINE BESYLATE 10 MG T: 10 | 30 days supply | Qty: 30 | Fill #5

## 2017-01-13 MED FILL — METOPROLOL TARTRATE 25 MG T: 25 | 30 days supply | Qty: 30 | Fill #2

## 2017-01-13 MED FILL — hydrALAZINE HCL 25 MG TABS: 25 | 30 days supply | Qty: 90 | Fill #2

## 2017-01-16 ENCOUNTER — Telehealth: Payer: Self-pay | Admitting: Internal Medicine

## 2017-01-16 NOTE — Telephone Encounter (Signed)
Pt called since she lost the blood glucose meter kit  And she is wondering if she can get another one, she got the meter this year and she has medicaid, also if possible can get the one that can be put again the skin, please follow up

## 2017-01-16 NOTE — Telephone Encounter (Signed)
Will route to nurse °

## 2017-01-17 NOTE — Telephone Encounter (Signed)
Contacted pt and informed her that she lost her meter she will have to call mediciad to see if they will cover another one if not she will have to pay for it. Pt states she understands and she will give [place] a call back

## 2017-01-20 MED FILL — metFORMIN HCL 1000 MG TABS: 1000 | 30 days supply | Qty: 60 | Fill #2

## 2017-01-20 MED FILL — GABAPENTIN 300 MG CAPSULE: 300 | 30 days supply | Qty: 90 | Fill #1

## 2017-02-11 MED FILL — ATORVASTATIN 20 MG TABLET: 20 | 90 days supply | Qty: 90 | Fill #2

## 2017-02-14 MED FILL — TIMOLOL 0.5% EYE DROPS: 0.5 | 30 days supply | Qty: 5 | Fill #0

## 2017-02-14 MED FILL — LATANOPROST 0.005% EYE DRP: 0.005 | 15 days supply | Qty: 3 | Fill #0

## 2017-02-17 ENCOUNTER — Encounter: Payer: Self-pay | Admitting: Internal Medicine

## 2017-02-17 ENCOUNTER — Ambulatory Visit: Payer: Medicare Other | Attending: Internal Medicine | Admitting: Internal Medicine

## 2017-02-17 VITALS — BP 143/91 | HR 95 | Temp 98.6°F | Resp 16 | Wt 226.2 lb

## 2017-02-17 DIAGNOSIS — E785 Hyperlipidemia, unspecified: Secondary | ICD-10-CM | POA: Diagnosis not present

## 2017-02-17 DIAGNOSIS — Z8543 Personal history of malignant neoplasm of ovary: Secondary | ICD-10-CM | POA: Insufficient documentation

## 2017-02-17 DIAGNOSIS — E119 Type 2 diabetes mellitus without complications: Secondary | ICD-10-CM | POA: Diagnosis not present

## 2017-02-17 DIAGNOSIS — I10 Essential (primary) hypertension: Secondary | ICD-10-CM | POA: Diagnosis not present

## 2017-02-17 DIAGNOSIS — Z79899 Other long term (current) drug therapy: Secondary | ICD-10-CM | POA: Diagnosis not present

## 2017-02-17 DIAGNOSIS — Z800 Family history of malignant neoplasm of digestive organs: Secondary | ICD-10-CM | POA: Insufficient documentation

## 2017-02-17 DIAGNOSIS — Z794 Long term (current) use of insulin: Secondary | ICD-10-CM | POA: Insufficient documentation

## 2017-02-17 DIAGNOSIS — Z23 Encounter for immunization: Secondary | ICD-10-CM | POA: Insufficient documentation

## 2017-02-17 LAB — POCT CBG (FASTING - GLUCOSE)-MANUAL ENTRY: GLUCOSE FASTING, POC: 174 mg/dL — AB (ref 70–99)

## 2017-02-17 LAB — POCT GLYCOSYLATED HEMOGLOBIN (HGB A1C): Hemoglobin A1C: 7.7

## 2017-02-17 MED ORDER — HYDROCHLOROTHIAZIDE 25 MG PO TABS: 25.0000 mg | ORAL_TABLET | Freq: Every day | ORAL | 3 refills | Status: DC

## 2017-02-17 MED ORDER — HYDRALAZINE HCL 25 MG PO TABS: 25.0000 mg | ORAL_TABLET | Freq: Three times a day (TID) | ORAL | 3 refills | Status: DC

## 2017-02-17 MED ORDER — ATORVASTATIN CALCIUM 20 MG PO TABS: 20.0000 mg | ORAL_TABLET | Freq: Every day | ORAL | 3 refills | Status: DC

## 2017-02-17 MED ORDER — METOPROLOL TARTRATE 25 MG PO TABS: ORAL_TABLET | ORAL | 3 refills | Status: DC

## 2017-02-17 MED ORDER — GABAPENTIN 300 MG PO CAPS: 300.0000 mg | ORAL_CAPSULE | Freq: Three times a day (TID) | ORAL | 3 refills | Status: DC

## 2017-02-17 MED ORDER — AMLODIPINE BESYLATE 10 MG PO TABS: 10.0000 mg | ORAL_TABLET | Freq: Every day | ORAL | 3 refills | Status: DC

## 2017-02-17 MED ORDER — METFORMIN HCL 1000 MG PO TABS: 1000.0000 mg | ORAL_TABLET | Freq: Two times a day (BID) | ORAL | 3 refills | Status: DC

## 2017-02-17 MED ORDER — ACCU-CHEK AVIVA DEVI: 0 refills | Status: DC

## 2017-02-17 MED FILL — metFORMIN HCL 1000 MG TABS: 1000 | 90 days supply | Qty: 180 | Fill #0

## 2017-02-17 MED FILL — GABAPENTIN 300 MG CAPSULE: 300 | 30 days supply | Qty: 90 | Fill #0

## 2017-02-17 MED FILL — HYDROCHLOROTHIAZIDE 25 MG T: 25 | 90 days supply | Qty: 90 | Fill #0

## 2017-02-17 MED FILL — AMLODIPINE BESYLATE 10 MG T: 10 | 90 days supply | Qty: 90 | Fill #0

## 2017-02-17 MED FILL — METOPROLOL TARTRATE 25 MG T: 25 | 90 days supply | Qty: 90 | Fill #0

## 2017-02-17 MED FILL — hydrALAZINE HCL 25 MG TABS: 25 | 30 days supply | Qty: 90 | Fill #0

## 2017-02-17 NOTE — Patient Instructions (Signed)
DASH Eating Plan DASH stands for "Dietary Approaches to Stop Hypertension." The DASH eating plan is a healthy eating plan that has been shown to reduce high blood pressure (hypertension). It may also reduce your risk for type 2 diabetes, heart disease, and stroke. The DASH eating plan may also help with weight loss. What are tips for following this plan? General guidelines  Avoid eating more than 2,300 mg (milligrams) of salt (sodium) a day. If you have hypertension, you may need to reduce your sodium intake to 1,500 mg a day.  Limit alcohol intake to no more than 1 drink a day for nonpregnant women and 2 drinks a day for men. One drink equals 12 oz of beer, 5 oz of wine, or 1 oz of hard liquor.  Work with your health care provider to maintain a healthy body weight or to lose weight. Ask what an ideal weight is for you.  Get at least 30 minutes of exercise that causes your heart to beat faster (aerobic exercise) most days of the week. Activities may include walking, swimming, or biking.  Work with your health care provider or diet and nutrition specialist (dietitian) to adjust your eating plan to your individual calorie needs. Reading food labels  Check food labels for the amount of sodium per serving. Choose foods with less than 5 percent of the Daily Value of sodium. Generally, foods with less than 300 mg of sodium per serving fit into this eating plan.  To find whole grains, look for the word "whole" as the first word in the ingredient list. Shopping  Buy products labeled as "low-sodium" or "no salt added."  Buy fresh foods. Avoid canned foods and premade or frozen meals. Cooking  Avoid adding salt when cooking. Use salt-free seasonings or herbs instead of table salt or sea salt. Check with your health care provider or pharmacist before using salt substitutes.  Do not fry foods. Cook foods using healthy methods such as baking, boiling, grilling, and broiling instead.  Cook with  heart-healthy oils, such as olive, canola, soybean, or sunflower oil. Meal planning   Eat a balanced diet that includes: ? 5 or more servings of fruits and vegetables each day. At each meal, try to fill half of your plate with fruits and vegetables. ? Up to 6-8 servings of whole grains each day. ? Less than 6 oz of lean meat, poultry, or fish each day. A 3-oz serving of meat is about the same size as a deck of cards. One egg equals 1 oz. ? 2 servings of low-fat dairy each day. ? A serving of nuts, seeds, or beans 5 times each week. ? Heart-healthy fats. Healthy fats called Omega-3 fatty acids are found in foods such as flaxseeds and coldwater fish, like sardines, salmon, and mackerel.  Limit how much you eat of the following: ? Canned or prepackaged foods. ? Food that is high in trans fat, such as fried foods. ? Food that is high in saturated fat, such as fatty meat. ? Sweets, desserts, sugary drinks, and other foods with added sugar. ? Full-fat dairy products.  Do not salt foods before eating.  Try to eat at least 2 vegetarian meals each week.  Eat more home-cooked food and less restaurant, buffet, and fast food.  When eating at a restaurant, ask that your food be prepared with less salt or no salt, if possible. What foods are recommended? The items listed may not be a complete list. Talk with your dietitian about what   dietary choices are best for you. Grains Whole-grain or whole-wheat bread. Whole-grain or whole-wheat pasta. Brown rice. Oatmeal. Quinoa. Bulgur. Whole-grain and low-sodium cereals. Pita bread. Low-fat, low-sodium crackers. Whole-wheat flour tortillas. Vegetables Fresh or frozen vegetables (raw, steamed, roasted, or grilled). Low-sodium or reduced-sodium tomato and vegetable juice. Low-sodium or reduced-sodium tomato sauce and tomato paste. Low-sodium or reduced-sodium canned vegetables. Fruits All fresh, dried, or frozen fruit. Canned fruit in natural juice (without  added sugar). Meat and other protein foods Skinless chicken or turkey. Ground chicken or turkey. Pork with fat trimmed off. Fish and seafood. Egg whites. Dried beans, peas, or lentils. Unsalted nuts, nut butters, and seeds. Unsalted canned beans. Lean cuts of beef with fat trimmed off. Low-sodium, lean deli meat. Dairy Low-fat (1%) or fat-free (skim) milk. Fat-free, low-fat, or reduced-fat cheeses. Nonfat, low-sodium ricotta or cottage cheese. Low-fat or nonfat yogurt. Low-fat, low-sodium cheese. Fats and oils Soft margarine without trans fats. Vegetable oil. Low-fat, reduced-fat, or light mayonnaise and salad dressings (reduced-sodium). Canola, safflower, olive, soybean, and sunflower oils. Avocado. Seasoning and other foods Herbs. Spices. Seasoning mixes without salt. Unsalted popcorn and pretzels. Fat-free sweets. What foods are not recommended? The items listed may not be a complete list. Talk with your dietitian about what dietary choices are best for you. Grains Baked goods made with fat, such as croissants, muffins, or some breads. Dry pasta or rice meal packs. Vegetables Creamed or fried vegetables. Vegetables in a cheese sauce. Regular canned vegetables (not low-sodium or reduced-sodium). Regular canned tomato sauce and paste (not low-sodium or reduced-sodium). Regular tomato and vegetable juice (not low-sodium or reduced-sodium). Pickles. Olives. Fruits Canned fruit in a light or heavy syrup. Fried fruit. Fruit in cream or butter sauce. Meat and other protein foods Fatty cuts of meat. Ribs. Fried meat. Bacon. Sausage. Bologna and other processed lunch meats. Salami. Fatback. Hotdogs. Bratwurst. Salted nuts and seeds. Canned beans with added salt. Canned or smoked fish. Whole eggs or egg yolks. Chicken or turkey with skin. Dairy Whole or 2% milk, cream, and half-and-half. Whole or full-fat cream cheese. Whole-fat or sweetened yogurt. Full-fat cheese. Nondairy creamers. Whipped toppings.  Processed cheese and cheese spreads. Fats and oils Butter. Stick margarine. Lard. Shortening. Ghee. Bacon fat. Tropical oils, such as coconut, palm kernel, or palm oil. Seasoning and other foods Salted popcorn and pretzels. Onion salt, garlic salt, seasoned salt, table salt, and sea salt. Worcestershire sauce. Tartar sauce. Barbecue sauce. Teriyaki sauce. Soy sauce, including reduced-sodium. Steak sauce. Canned and packaged gravies. Fish sauce. Oyster sauce. Cocktail sauce. Horseradish that you find on the shelf. Ketchup. Mustard. Meat flavorings and tenderizers. Bouillon cubes. Hot sauce and Tabasco sauce. Premade or packaged marinades. Premade or packaged taco seasonings. Relishes. Regular salad dressings. Where to find more information:  National Heart, Lung, and Blood Institute: www.nhlbi.nih.gov  American Heart Association: www.heart.org Summary  The DASH eating plan is a healthy eating plan that has been shown to reduce high blood pressure (hypertension). It may also reduce your risk for type 2 diabetes, heart disease, and stroke.  With the DASH eating plan, you should limit salt (sodium) intake to 2,300 mg a day. If you have hypertension, you may need to reduce your sodium intake to 1,500 mg a day.  When on the DASH eating plan, aim to eat more fresh fruits and vegetables, whole grains, lean proteins, low-fat dairy, and heart-healthy fats.  Work with your health care provider or diet and nutrition specialist (dietitian) to adjust your eating plan to your individual   calorie needs. This information is not intended to replace advice given to you by your health care provider. Make sure you discuss any questions you have with your health care provider. Document Released: 05/16/2011 Document Revised: 05/20/2016 Document Reviewed: 05/20/2016 Elsevier Interactive Patient Education  2017 [employer] Hypertension Hypertension, commonly called high blood pressure, is when the force of blood  pumping through the arteries is too strong. The arteries are the blood vessels that carry blood from the heart throughout the body. Hypertension forces the heart to work harder to pump blood and may cause arteries to become narrow or stiff. Having untreated or uncontrolled hypertension can cause heart attacks, strokes, kidney disease, and other problems. A blood pressure reading consists of a higher number over a lower number. Ideally, your blood pressure should be below 120/80. The first ("top") number is called the systolic pressure. It is a measure of the pressure in your arteries as your heart beats. The second ("bottom") number is called the diastolic pressure. It is a measure of the pressure in your arteries as the heart relaxes. What are the causes? The cause of this condition is not known. What increases the risk? Some risk factors for high blood pressure are under your control. Others are not. Factors you can change  Smoking.  Having type 2 diabetes mellitus, high cholesterol, or both.  Not getting enough exercise or physical activity.  Being overweight.  Having too much fat, sugar, calories, or salt (sodium) in your diet.  Drinking too much alcohol. Factors that are difficult or impossible to change  Having chronic kidney disease.  Having a family history of high blood pressure.  Age. Risk increases with age.  Race. You may be at higher risk if you are African-American.  Gender. Men are at higher risk than women before age 45. After age 65, women are at higher risk than men.  Having obstructive sleep apnea.  Stress. What are the signs or symptoms? Extremely high blood pressure (hypertensive crisis) may cause:  Headache.  Anxiety.  Shortness of breath.  Nosebleed.  Nausea and vomiting.  Severe chest pain.  Jerky movements you cannot control (seizures).  How is this diagnosed? This condition is diagnosed by measuring your blood pressure while you are  seated, with your arm resting on a surface. The cuff of the blood pressure monitor will be placed directly against the skin of your upper arm at the level of your heart. It should be measured at least twice using the same arm. Certain conditions can cause a difference in blood pressure between your right and left arms. Certain factors can cause blood pressure readings to be lower or higher than normal (elevated) for a short period of time:  When your blood pressure is higher when you are in a health care provider's office than when you are at home, this is called white coat hypertension. Most people with this condition do not need medicines.  When your blood pressure is higher at home than when you are in a health care provider's office, this is called masked hypertension. Most people with this condition may need medicines to control blood pressure.  If you have a high blood pressure reading during one visit or you have normal blood pressure with other risk factors:  You may be asked to return on a different day to have your blood pressure checked again.  You may be asked to monitor your blood pressure at home for 1 week or longer.  If you are diagnosed   with hypertension, you may have other blood or imaging tests to help your health care provider understand your overall risk for other conditions. How is this treated? This condition is treated by making healthy lifestyle changes, such as eating healthy foods, exercising more, and reducing your alcohol intake. Your health care provider may prescribe medicine if lifestyle changes are not enough to get your blood pressure under control, and if:  Your systolic blood pressure is above 130.  Your diastolic blood pressure is above 80.  Your personal target blood pressure may vary depending on your medical conditions, your age, and other factors. Follow these instructions at home: Eating and drinking  Eat a diet that is high in fiber and potassium,  and low in sodium, added sugar, and fat. An example eating plan is called the DASH (Dietary Approaches to Stop Hypertension) diet. To eat this way: ? Eat plenty of fresh fruits and vegetables. Try to fill half of your plate at each meal with fruits and vegetables. ? Eat whole grains, such as whole wheat pasta, brown rice, or whole grain bread. Fill about one quarter of your plate with whole grains. ? Eat or drink low-fat dairy products, such as skim milk or low-fat yogurt. ? Avoid fatty cuts of meat, processed or cured meats, and poultry with skin. Fill about one quarter of your plate with lean proteins, such as fish, chicken without skin, beans, eggs, and tofu. ? Avoid premade and processed foods. These tend to be higher in sodium, added sugar, and fat.  Reduce your daily sodium intake. Most people with hypertension should eat less than 1,500 mg of sodium a day.  Limit alcohol intake to no more than 1 drink a day for nonpregnant women and 2 drinks a day for men. One drink equals 12 oz of beer, 5 oz of wine, or 1 oz of hard liquor. Lifestyle  Work with your health care provider to maintain a healthy body weight or to lose weight. Ask what an ideal weight is for you.  Get at least 30 minutes of exercise that causes your heart to beat faster (aerobic exercise) most days of the week. Activities may include walking, swimming, or biking.  Include exercise to strengthen your muscles (resistance exercise), such as pilates or lifting weights, as part of your weekly exercise routine. Try to do these types of exercises for 30 minutes at least 3 days a week.  Do not use any products that contain nicotine or tobacco, such as cigarettes and e-cigarettes. If you need help quitting, ask your health care provider.  Monitor your blood pressure at home as told by your health care provider.  Keep all follow-up visits as told by your health care provider. This is important. Medicines  Take over-the-counter and  prescription medicines only as told by your health care provider. Follow directions carefully. Blood pressure medicines must be taken as prescribed.  Do not skip doses of blood pressure medicine. Doing this puts you at risk for problems and can make the medicine less effective.  Ask your health care provider about side effects or reactions to medicines that you should watch for. Contact a health care provider if:  You think you are having a reaction to a medicine you are taking.  You have headaches that keep coming back (recurring).  You feel dizzy.  You have swelling in your ankles.  You have trouble with your vision. Get help right away if:  You develop a severe headache or confusion.  You   have unusual weakness or numbness.  You feel faint.  You have severe pain in your chest or abdomen.  You vomit repeatedly.  You have trouble breathing. Summary  Hypertension is when the force of blood pumping through your arteries is too strong. If this condition is not controlled, it may put you at risk for serious complications.  Your personal target blood pressure may vary depending on your medical conditions, your age, and other factors. For most people, a normal blood pressure is less than 120/80.  Hypertension is treated with lifestyle changes, medicines, or a combination of both. Lifestyle changes include weight loss, eating a healthy, low-sodium diet, exercising more, and limiting alcohol. This information is not intended to replace advice given to you by your health care provider. Make sure you discuss any questions you have with your health care provider. Document Released: 05/27/2005 Document Revised: 04/24/2016 Document Reviewed: 04/24/2016 Elsevier Interactive Patient Education  2018 [employer]. Diabetes Mellitus and Exercise Exercising regularly is important for your overall health, especially when you have diabetes (diabetes mellitus). Exercising is not only about losing  weight. It has many health benefits, such as increasing muscle strength and bone density and reducing body fat and stress. This leads to improved fitness, flexibility, and endurance, all of which result in better overall health. Exercise has additional benefits for people with diabetes, including:  Reducing appetite.  Helping to lower and control blood glucose.  Lowering blood pressure.  Helping to control amounts of fatty substances (lipids) in the blood, such as cholesterol and triglycerides.  Helping the body to respond better to insulin (improving insulin sensitivity).  Reducing how much insulin the body needs.  Decreasing the risk for heart disease by: ? Lowering cholesterol and triglyceride levels. ? Increasing the levels of good cholesterol. ? Lowering blood glucose levels.  What is my activity plan? Your health care provider or certified diabetes educator can help you make a plan for the type and frequency of exercise (activity plan) that works for you. Make sure that you:  Do at least 150 minutes of moderate-intensity or vigorous-intensity exercise each week. This could be brisk walking, biking, or water aerobics. ? Do stretching and strength exercises, such as yoga or weightlifting, at least 2 times a week. ? Spread out your activity over at least 3 days of the week.  Get some form of physical activity every day. ? Do not go more than 2 days in a row without some kind of physical activity. ? Avoid being inactive for more than 90 minutes at a time. Take frequent breaks to walk or stretch.  Choose a type of exercise or activity that you enjoy, and set realistic goals.  Start slowly, and gradually increase the intensity of your exercise over time.  What do I need to know about managing my diabetes?  Check your blood glucose before and after exercising. ? If your blood glucose is higher than 240 mg/dL (13.3 mmol/L) before you exercise, check your urine for ketones. If you  have ketones in your urine, do not exercise until your blood glucose returns to normal.  Know the symptoms of low blood glucose (hypoglycemia) and how to treat it. Your risk for hypoglycemia increases during and after exercise. Common symptoms of hypoglycemia can include: ? Hunger. ? Anxiety. ? Sweating and feeling clammy. ? Confusion. ? Dizziness or feeling light-headed. ? Increased heart rate or palpitations. ? Blurry vision. ? Tingling or numbness around the mouth, lips, or tongue. ? Tremors or shakes. ?  Irritability.  Keep a rapid-acting carbohydrate snack available before, during, and after exercise to help prevent or treat hypoglycemia.  Avoid injecting insulin into areas of the body that are going to be exercised. For example, avoid injecting insulin into: ? The arms, when playing tennis. ? The legs, when jogging.  Keep records of your exercise habits. Doing this can help you and your health care provider adjust your diabetes management plan as needed. Write down: ? Food that you eat before and after you exercise. ? Blood glucose levels before and after you exercise. ? The type and amount of exercise you have done. ? When your insulin is expected to peak, if you use insulin. Avoid exercising at times when your insulin is peaking.  When you start a new exercise or activity, work with your health care provider to make sure the activity is safe for you, and to adjust your insulin, medicines, or food intake as needed.  Drink plenty of water while you exercise to prevent dehydration or heat stroke. Drink enough fluid to keep your urine clear or pale yellow. This information is not intended to replace advice given to you by your health care provider. Make sure you discuss any questions you have with your health care provider. Document Released: 08/17/2003 Document Revised: 12/15/2015 Document Reviewed: 11/06/2015 Elsevier Interactive Patient Education  2018 [place]. Blood  Glucose Monitoring, Adult Monitoring your blood sugar (glucose) helps you manage your diabetes. It also helps you and your health care provider determine how well your diabetes management plan is working. Blood glucose monitoring involves checking your blood glucose as often as directed, and keeping a record (log) of your results over time. Why should I monitor my blood glucose? Checking your blood glucose regularly can:  Help you understand how food, exercise, illnesses, and medicines affect your blood glucose.  Let you know what your blood glucose is at any time. You can quickly tell if you are having low blood glucose (hypoglycemia) or high blood glucose (hyperglycemia).  Help you and your health care provider adjust your medicines as needed.  When should I check my blood glucose? Follow instructions from your health care provider about how often to check your blood glucose. This may depend on:  The type of diabetes you have.  How well-controlled your diabetes is.  Medicines you are taking.  If you have type 1 diabetes:  Check your blood glucose at least 2 times a day.  Also check your blood glucose: ? Before every insulin injection. ? Before and after exercise. ? Between meals. ? 2 hours after a meal. ? Occasionally between 2:00 a.m. and 3:00 a.m., as directed. ? Before potentially dangerous tasks, like driving or using heavy machinery. ? At bedtime.  You may need to check your blood glucose more often, up to 6-10 times a day: ? If you use an insulin pump. ? If you need multiple daily injections (MDI). ? If your diabetes is not well-controlled. ? If you are ill. ? If you have a history of severe hypoglycemia. ? If you have a history of not knowing when your blood glucose is getting low (hypoglycemia unawareness). If you have type 2 diabetes:  If you take insulin or other diabetes medicines, check your blood glucose at least 2 times a day.  If you are on intensive  insulin therapy, check your blood glucose at least 4 times a day. Occasionally, you may also need to check between 2:00 a.m. and 3:00 a.m., as directed.  Also check your blood glucose: ? Before and after exercise. ? Before potentially dangerous tasks, like driving or using heavy machinery.  You may need to check your blood glucose more often if: ? Your medicine is being adjusted. ? Your diabetes is not well-controlled. ? You are ill. What is a blood glucose log?  A blood glucose log is a record of your blood glucose readings. It helps you and your health care provider: ? Look for patterns in your blood glucose over time. ? Adjust your diabetes management plan as needed.  Every time you check your blood glucose, write down your result and notes about things that may be affecting your blood glucose, such as your diet and exercise for the day.  Most glucose meters store a record of glucose readings in the meter. Some meters allow you to download your records to a computer. How do I check my blood glucose? Follow these steps to get accurate readings of your blood glucose: Supplies needed   Blood glucose meter.  Test strips for your meter. Each meter has its own strips. You must use the strips that come with your meter.  A needle to prick your finger (lancet). Do not use lancets more than once.  A device that holds the lancet (lancing device).  A journal or log book to write down your results. Procedure  Wash your hands with soap and water.  Prick the side of your finger (not the tip) with the lancet. Use a different finger each time.  Gently rub the finger until a small drop of blood appears.  Follow instructions that come with your meter for inserting the test strip, applying blood to the strip, and using your blood glucose meter.  Write down your result and any notes. Alternative testing sites  Some meters allow you to use areas of your body other than your finger  (alternative sites) to test your blood.  If you think you may have hypoglycemia, or if you have hypoglycemia unawareness, do not use alternative sites. Use your finger instead.  Alternative sites may not be as accurate as the fingers, because blood flow is slower in these areas. This means that the result you get may be delayed, and it may be different from the result that you would get from your finger.  The most common alternative sites are: ? Forearm. ? Thigh. ? Palm of the hand. Additional tips  Always keep your supplies with you.  If you have questions or need help, all blood glucose meters have a 24-hour "hotline" number that you can call. You may also contact your health care provider.  After you use a few boxes of test strips, adjust (calibrate) your blood glucose meter by following instructions that came with your meter. This information is not intended to replace advice given to you by your health care provider. Make sure you discuss any questions you have with your health care provider. Document Released: 05/30/2003 Document Revised: 12/15/2015 Document Reviewed: 11/06/2015 Elsevier Interactive Patient Education  2017 [employer].

## 2017-02-17 NOTE — Progress Notes (Signed)
Discuss HTN medication and F/u DM

## 2017-02-17 NOTE — Progress Notes (Signed)
Sarah [name], is a 51 y.o. female  [id number]  [id number]  DOB - Jun 02, 1966  No chief complaint on file.      Subjective:   Sarah [name] is a 51 y.o. female with history of hypertension, Diabetes, hyperlipidemia andstage IIIc low-grade serous carcinoma of the ovaryS/p BSO, omentectomy, appendectomy on 07/28/14. S/p adjuvant chemotherapy with 6 cycles of paclitaxel and carboplatin completed on 12/08/14. S/p an ex lap, hernia repair and small bowel resection for a presumed recurrence (benign pathology). She is here today for a follow up visit and medication management. She has no new complaint today, last CT abdomen in June 2018 showed "No metastasis". She is going through tough times lately, recently separated from her husband of 27 years. Her last HbA1C was 7.3%. She takes her medications regularly but was taken off lisinopril/hydrochlorothiazisde because of incessant cough, but not replaced with any other BP meds. BP has been slightly on the high side. Otherwise, she is completely doing fine. Still exercising, denies depression or anxiety, denies any visual impairment, has ophthalmology appointment coming up. Patient has No headache, No chest pain, No abdominal pain - No Nausea, No new weakness tingling or numbness, No Cough - SOB.  Problem  Need for Influenza Vaccination    ALLERGIES: Allergies  Allergen Reactions  . Emend [Aprepitant] Other (See Comments)    Urticaria    PAST MEDICAL HISTORY: Past Medical History:  Diagnosis Date  . Arthritis   . Family history of colon cancer   . Glaucoma 02/16  . Glucagonoma 07/28/14   Pt denies this but states she has glaucoma  . History of chemotherapy   . Hypertension   . Imbalance   . Neuropathy    feet bilat  . Nocturia   . Peritoneal carcinomatosis ([place])    carcinoma of ovary   . Shortness of breath dyspnea    hx of 2013 - no problems currently   . Thyroid nodule    history of   . Wears glasses     MEDICATIONS AT  HOME: Prior to Admission medications   Medication Sig Start Date End Date Taking? Authorizing Provider  amLODipine (NORVASC) 10 MG tablet Take 1 tablet (10 mg total) by mouth daily. 02/17/17  Yes Tresa Garter, MD  atorvastatin (LIPITOR) 20 MG tablet Take 1 tablet (20 mg total) by mouth daily. 02/17/17  Yes Tresa Garter, MD  blood glucose meter kit and supplies Dispense based on patient and insurance preference. Use up to four times daily as directed. (FOR ICD-9 250.00, 250.01). 07/12/15  Yes Cross, Melissa D, NP  gabapentin (NEURONTIN) 300 MG capsule Take 1 capsule (300 mg total) by mouth 3 (three) times daily. 02/17/17  Yes Tresa Garter, MD  hydrALAZINE (APRESOLINE) 25 MG tablet Take 1 tablet (25 mg total) by mouth 3 (three) times daily. 02/17/17  Yes Tresa Garter, MD  ibuprofen (ADVIL,MOTRIN) 400 MG tablet Take 1 tablet (400 mg total) by mouth every 8 (eight) hours as needed. 05/20/16  Yes Fredia Beets R, FNP  Insulin Pen Needle 31G X 5 MM MISC For use with lantus pen for injections once daily 07/12/15  Yes Cross, Melissa D, NP  Lancets (ACCU-CHEK MULTICLIX) lancets  07/12/15  Yes [provider]  latanoprost (XALATAN) 0.005 % ophthalmic solution Place 1 drop into both eyes at bedtime.   Yes [provider]  metFORMIN (GLUCOPHAGE) 1000 MG tablet Take 1 tablet (1,000 mg total) by mouth 2 (two) times daily with a meal. 02/17/17  Yes Versie Soave E, MD  metoprolol tartrate (LOPRESSOR) 25 MG tablet TAKE 1/2 TABLET BY MOUTH 2 TIMES A DAY 02/17/17  Yes Soriya Worster E, MD  timolol (TIMOPTIC) 0.5 % ophthalmic solution Place 1 drop into both eyes daily.   Yes [provider]  ACCU-CHEK SMARTVIEW test strip TID and a bedtime. E11.9 02/08/16   Quentin Angst, MD  acetaminophen-codeine (TYLENOL #3) 300-30 MG tablet Take 1 tablet by mouth every 4 (four) hours as needed. Patient not taking: Reported on 02/17/2017 10/26/15   Quentin Angst, MD  Blood Glucose Monitoring Suppl (ACCU-CHEK AVIVA) device Use as instructed 02/17/17 02/17/18  Quentin Angst, MD  hydrochlorothiazide (HYDRODIURIL) 25 MG tablet Take 1 tablet (25 mg total) by mouth daily. 02/17/17   Quentin Angst, MD  lisinopril-hydrochlorothiazide (PRINZIDE,ZESTORETIC) 10-12.5 MG tablet Take 1 tablet by mouth daily. Patient not taking: Reported on 02/17/2017 11/13/16   Quentin Angst, MD    Objective:   Vitals:   02/17/17 0857  BP: (!) 143/91  Pulse: 95  SpO2: 96%  Weight: 226 lb 3.2 oz (102.6 kg)   Exam General appearance : Awake, alert, not in any distress. Speech Clear. Not toxic looking, obese HEENT: Atraumatic and Normocephalic, pupils equally reactive to light and accomodation Neck: Supple, no JVD. No cervical lymphadenopathy.  Chest: Good air entry bilaterally, no added sounds  CVS: S1 S2 regular, no murmurs.  Abdomen: Bowel sounds present, Non tender and not distended with no gaurding, rigidity or rebound. Extremities: B/L Lower Ext shows no edema, both legs are warm to touch Neurology: Awake alert, and oriented X 3, CN II-XII intact, Non focal Skin: No Rash  Data Review Lab Results  Component Value Date   HGBA1C 7.7 02/17/2017   HGBA1C 7.3 11/13/2016   HGBA1C 7.3 08/14/2016    Assessment & Plan   1. Type 2 diabetes mellitus without complication, without long-term current use of insulin (HCC)  - Glucose (CBG), Fasting - HgB A1c  - metFORMIN (GLUCOPHAGE) 1000 MG tablet; Take 1 tablet (1,000 mg total) by mouth 2 (two) times daily with a meal.  Dispense: 180 tablet; Refill: 3 - metoprolol tartrate (LOPRESSOR) 25 MG tablet; TAKE 1/2 TABLET BY MOUTH 2 TIMES A DAY  Dispense: 90 tablet; Refill: 3 - Blood Glucose Monitoring Suppl (ACCU-CHEK AVIVA) device; Use as instructed  Dispense: 1 each; Refill: 0  2. Need for influenza vaccination  - Flu Vaccine QUAD 6+ mos PF IM (Fluarix Quad PF)  3. Essential hypertension: Blood Pressure  slightly above goal  - amLODipine (NORVASC) 10 MG tablet; Take 1 tablet (10 mg total) by mouth daily.  Dispense: 90 tablet; Refill: 3 - gabapentin (NEURONTIN) 300 MG capsule; Take 1 capsule (300 mg total) by mouth 3 (three) times daily.  Dispense: 90 capsule; Refill: 3 - hydrALAZINE (APRESOLINE) 25 MG tablet; Take 1 tablet (25 mg total) by mouth 3 (three) times daily.  Dispense: 90 tablet; Refill: 3 Add - hydrochlorothiazide (HYDRODIURIL) 25 MG tablet; Take 1 tablet (25 mg total) by mouth daily.  Dispense: 90 tablet; Refill: 3  4. Dyslipidemia  - atorvastatin (LIPITOR) 20 MG tablet; Take 1 tablet (20 mg total) by mouth daily.  Dispense: 90 tablet; Refill: 3  Patient have been counseled extensively about nutrition and exercise. Other issues discussed during this visit include: low cholesterol diet, weight control and daily exercise, foot care, annual eye examinations at Ophthalmology, importance of adherence with medications and regular follow-up. We also discussed long term complications of  uncontrolled diabetes and hypertension.   Return in about 3 months (around 05/19/2017) for Hemoglobin A1C and Follow up, DM, Follow up HTN.  The patient was given clear instructions to go to ER or return to medical center if symptoms don't improve, worsen or new problems develop. The patient verbalized understanding. The patient was told to call to get lab results if they haven't heard anything in the next week.   This note has been created with Surveyor, quantity. Any transcriptional errors are unintentional.    Angelica Chessman, MD, [place], Karilyn Cota, [place] and [place], [place]   02/17/2017, 9:40 AM

## 2017-02-19 ENCOUNTER — Ambulatory Visit: Payer: Medicaid Other | Admitting: Internal Medicine

## 2017-02-19 ENCOUNTER — Other Ambulatory Visit: Payer: Self-pay | Admitting: Pharmacist

## 2017-02-19 DIAGNOSIS — E119 Type 2 diabetes mellitus without complications: Secondary | ICD-10-CM

## 2017-02-19 MED ORDER — ACCU-CHEK AVIVA DEVI: 0 refills | Status: AC

## 2017-03-17 MED FILL — hydrALAZINE HCL 25 MG TABS: 25 | 30 days supply | Qty: 90 | Fill #1

## 2017-03-25 DIAGNOSIS — H401132 Primary open-angle glaucoma, bilateral, moderate stage: Secondary | ICD-10-CM | POA: Diagnosis not present

## 2017-03-25 DIAGNOSIS — H53411 Scotoma involving central area, right eye: Secondary | ICD-10-CM | POA: Diagnosis not present

## 2017-03-25 DIAGNOSIS — E119 Type 2 diabetes mellitus without complications: Secondary | ICD-10-CM | POA: Diagnosis not present

## 2017-03-25 DIAGNOSIS — H3589 Other specified retinal disorders: Secondary | ICD-10-CM | POA: Diagnosis not present

## 2017-03-25 MED FILL — GABAPENTIN 300 MG CAPSULE: 300 | 30 days supply | Qty: 90 | Fill #1

## 2017-04-15 ENCOUNTER — Telehealth: Payer: Self-pay | Admitting: *Deleted

## 2017-04-15 NOTE — Telephone Encounter (Signed)
Returned patient's call and gave her the appt for December 19th at 1:30pm.

## 2017-04-21 MED FILL — GABAPENTIN 300 MG CAPSULE: 300 | 30 days supply | Qty: 90 | Fill #2

## 2017-04-21 MED FILL — hydrALAZINE HCL 25 MG TABS: 25 | 30 days supply | Qty: 90 | Fill #2

## 2017-05-12 MED FILL — ATORVASTATIN 20 MG TABLET: 20 | 90 days supply | Qty: 90 | Fill #3

## 2017-05-12 MED FILL — HYDROCHLOROTHIAZIDE 25 MG T: 25 | 90 days supply | Qty: 90 | Fill #1 | Status: TO

## 2017-05-12 MED FILL — AMLODIPINE BESYLATE 10 MG T: 10 | 90 days supply | Qty: 90 | Fill #1

## 2017-05-12 MED FILL — METOPROLOL TARTRATE 25 MG T: 25 | 90 days supply | Qty: 90 | Fill #1

## 2017-05-19 ENCOUNTER — Other Ambulatory Visit: Payer: Self-pay | Admitting: Internal Medicine

## 2017-05-19 DIAGNOSIS — I10 Essential (primary) hypertension: Secondary | ICD-10-CM

## 2017-05-19 MED FILL — hydrALAZINE HCL 25 MG TABS: 25 | 30 days supply | Qty: 90 | Fill #3

## 2017-05-21 ENCOUNTER — Ambulatory Visit: Payer: Medicare Other | Admitting: Internal Medicine

## 2017-05-26 MED FILL — GABAPENTIN 300 MG CAPSULE: 300 | 30 days supply | Qty: 90 | Fill #3

## 2017-05-26 MED FILL — metFORMIN HCL 1000 MG TABS: 1000 | 90 days supply | Qty: 180 | Fill #1

## 2017-05-28 ENCOUNTER — Ambulatory Visit: Payer: Medicare Other | Attending: Gynecologic Oncology | Admitting: Gynecologic Oncology

## 2017-05-28 ENCOUNTER — Encounter: Payer: Self-pay | Admitting: Gynecologic Oncology

## 2017-05-28 ENCOUNTER — Other Ambulatory Visit (HOSPITAL_BASED_OUTPATIENT_CLINIC_OR_DEPARTMENT_OTHER): Payer: Medicare Other

## 2017-05-28 VITALS — BP 140/87 | HR 78 | Temp 98.1°F | Resp 18 | Wt 231.7 lb

## 2017-05-28 DIAGNOSIS — Z79899 Other long term (current) drug therapy: Secondary | ICD-10-CM | POA: Insufficient documentation

## 2017-05-28 DIAGNOSIS — Z9221 Personal history of antineoplastic chemotherapy: Secondary | ICD-10-CM

## 2017-05-28 DIAGNOSIS — Z8543 Personal history of malignant neoplasm of ovary: Secondary | ICD-10-CM | POA: Diagnosis not present

## 2017-05-28 DIAGNOSIS — H409 Unspecified glaucoma: Secondary | ICD-10-CM | POA: Insufficient documentation

## 2017-05-28 DIAGNOSIS — C561 Malignant neoplasm of right ovary: Secondary | ICD-10-CM | POA: Insufficient documentation

## 2017-05-28 DIAGNOSIS — Z90722 Acquired absence of ovaries, bilateral: Secondary | ICD-10-CM

## 2017-05-28 DIAGNOSIS — C562 Malignant neoplasm of left ovary: Secondary | ICD-10-CM | POA: Diagnosis not present

## 2017-05-28 DIAGNOSIS — Z9049 Acquired absence of other specified parts of digestive tract: Secondary | ICD-10-CM | POA: Insufficient documentation

## 2017-05-28 DIAGNOSIS — E1140 Type 2 diabetes mellitus with diabetic neuropathy, unspecified: Secondary | ICD-10-CM | POA: Insufficient documentation

## 2017-05-28 DIAGNOSIS — K432 Incisional hernia without obstruction or gangrene: Secondary | ICD-10-CM | POA: Insufficient documentation

## 2017-05-28 DIAGNOSIS — Z794 Long term (current) use of insulin: Secondary | ICD-10-CM | POA: Diagnosis not present

## 2017-05-28 DIAGNOSIS — I10 Essential (primary) hypertension: Secondary | ICD-10-CM | POA: Insufficient documentation

## 2017-05-28 NOTE — Progress Notes (Signed)
POST-TREATMENT FOLLOWUP. TREATMENT COUNSELING.  Assessment:   51 y.o. 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   S/p BSO, omentectomy, appendectomy on 07/28/14. S/p adjuvant chemotherapy with 6 cycles of paclitaxel and carboplatin. NED since 12/08/14. S/p an ex lap, hernia repair and small bowel resection for a presumed recurrence (benign pathology). Recurrent ventral hernia   No evidence of recurrent disease on exam,  recommend check CA 125 today.  Plan: 1) Recurrent ventral hernia: If no apparent recurrence, recommended she discuss her epigastric discomfort with [name] as this may be associated with her recurrent hernia. 2) Treatment counseling -  I recommend continuing routine surveillance with visit in 6 months with CA 125 assessments (CT scans on a prn basis for symptoms or rise in CA 125). I will see her back inJune, 2019. 3) newly diagnosed type II DM- patient is on active therapy with insulin and oral medical therapy.  HPI:  Sarah [name] is a 51 y.o. year old initially seen in consultation on 07/08/14 for bilateral ovarian masses and ascites. This was diagnosed at the time of admission to the hospital for hypertensive crisis. This diagnosis of hypertension was new and she was newly started on a medical regimen for this. She then underwent an exploratory laparotomy, BSO, omentectomy, appendectomy on 07/28/14 with Dr Skeet Latch without complications.  Her postoperative course was uncomplicated.  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 This represented a stage IIIc disease process.  She received 6 cycles of adjuvant chemotherapy with paclitaxel and carboplatin with Dr Marko Plume on 08/25/14 to 12/08/14.  Pretreatment CA 125 was 58 on 06/30/14, and was 20 on 12/22/14 (it's nadir had been 16 on 09/22/14).  Post treatment CT of the chest, abdomen and pelvis on  01/12/15 revealed: Previously noted paratracheal, subcarinal and prevascular mediastinal lymphadenopathy has resolved, with no residual pathologically enlarged mediastinal lymph nodes. Resolved hilar lymphadenopathy, with no residual pathologically enlarged mediastinal nodes. The previously described 3 mm subpleural left lower lobe pulmonary nodule is decreased to 2 mm (series 4/image 41). A separate 2 mm subpleural left lower lobe pulmonary nodule (4/29) is slightly decreased from 84m on 06/28/2014. A subpleural 3 mm right upper lobe pulmonary nodule (4/15) is unchanged from 06/28/2014. A 2 mm subpleural pulmonary nodule in the superior segment right lower lobe associated with the right major fissure (4/21) appears new. A 0.7 cm right posterior mesenteric node measures 0.7 cm short axis (series 2/image 93), decreased from 1.2 cm. No definite peritoneal tumor implants detected. A 0.4 cm nodule anterior/inferior to the spleen (series 2/ image 60) is mildly decreased from 0.7 cm. Small umbilical hernia containing a small bowel loop, with no evidence of small-bowel obstruction or strangulation.  She had a CA 125 on 03/27/15 that was normal and stable at 14.  A CT of the chest, abdomen and pelvis was performed on 04/27/15 which showed: No pulmonary mass, infiltrate, or effusion. Tiny less than 5 mm subpleural pulmonary nodules in the right upper lobe on image remain stable. No new or enlarging pulmonary nodules identified. Small left paraventral hernia containing bowel loops. New soft tissue nodularity is seen within the anterior abdominal mesenteric fat, with largest nodule measuring 10 x 17 mm. No other sites of peritoneal nodularity or soft tissue density seen.   On 07/06/15 she was taken to the OR for an ex lap, lysis of adhesions, and small bowel resection with ventral  hernia repair. FIndings were remarkable for substantial small bowel adhesions, hwoever the mass that had been seen on imaging was not  appreciated intraperitoneally. There was white nodularity and stranding on the mesentery of a segment of small bowel. It was completely resected with the small bowel resection. Pathology was benign.  Postoperatively she was noted to have very high blood glucose on acuchecks. An HbA1C was drawn on 07/07/15 and was very elevated at 10.1%. She was started on Insulin.  Her last CA 125 was 15 on 01/22/16 and 20.2 on 04/22/16 and 16.7 on 07/22/16 and 16.5 on 11/20/16.  Interval Hx:  CT abdo/pelvis for abdominal pains on 11/25/16 showed a large ventral hernia with unobstructed small bowel but no recurrence or metastases.  She has had symptomatic pains in her ventral hernia. Worse with sneezing and straining (working out). Not new. No symptoms concerning for recurrence.  Current Outpatient Medications on File Prior to Visit  Medication Sig Dispense Refill  . ACCU-CHEK SMARTVIEW test strip TID and a bedtime. E11.9 100 each 12  . acetaminophen-codeine (TYLENOL #3) 300-30 MG tablet Take 1 tablet by mouth every 4 (four) hours as needed. 60 tablet 0  . amLODipine (NORVASC) 10 MG tablet Take 1 tablet (10 mg total) by mouth daily. 90 tablet 3  . atorvastatin (LIPITOR) 20 MG tablet Take 1 tablet (20 mg total) by mouth daily. 90 tablet 3  . blood glucose meter kit and supplies Dispense based on patient and insurance preference. Use up to four times daily as directed. (FOR ICD-9 250.00, 250.01). 1 each 0  . Blood Glucose Monitoring Suppl (ACCU-CHEK AVIVA) device Use as instructed for once daily testing of blood sugar. E11.9 1 each 0  . gabapentin (NEURONTIN) 300 MG capsule Take 1 capsule (300 mg total) by mouth 3 (three) times daily. 90 capsule 3  . hydrALAZINE (APRESOLINE) 25 MG tablet Take 1 tablet (25 mg total) by mouth 3 (three) times daily. 90 tablet 3  . hydrochlorothiazide (HYDRODIURIL) 25 MG tablet Take 1 tablet (25 mg total) by mouth daily. 90 tablet 3  . ibuprofen (ADVIL,MOTRIN) 400 MG tablet Take 1 tablet  (400 mg total) by mouth every 8 (eight) hours as needed. 24 tablet 0  . Insulin Pen Needle 31G X 5 MM MISC For use with lantus pen for injections once daily 50 each 10  . Lancets (ACCU-CHEK MULTICLIX) lancets   0  . latanoprost (XALATAN) 0.005 % ophthalmic solution Place 1 drop into both eyes at bedtime.    Marland Kitchen lisinopril-hydrochlorothiazide (PRINZIDE,ZESTORETIC) 10-12.5 MG tablet Take 1 tablet by mouth daily. 90 tablet 3  . metFORMIN (GLUCOPHAGE) 1000 MG tablet Take 1 tablet (1,000 mg total) by mouth 2 (two) times daily with a meal. 180 tablet 3  . metoprolol tartrate (LOPRESSOR) 25 MG tablet TAKE 1/2 TABLET BY MOUTH 2 TIMES A DAY 90 tablet 3  . timolol (TIMOPTIC) 0.5 % ophthalmic solution Place 1 drop into both eyes daily.     No current facility-administered medications on file prior to visit.    Allergies  Allergen Reactions  . Emend [Aprepitant] Other (See Comments)    Urticaria    Past Medical History:  Diagnosis Date  . Arthritis   . Family history of colon cancer   . Glaucoma 02/16  . Glucagonoma 07/28/14   Pt denies this but states she has glaucoma  . History of chemotherapy   . Hypertension   . Imbalance   . Neuropathy    feet bilat  . Nocturia   .  Peritoneal carcinomatosis ([place])    carcinoma of ovary   . Shortness of breath dyspnea    hx of 2013 - no problems currently   . Thyroid nodule    history of   . Wears glasses    Past Surgical History:  Procedure Laterality Date  . ABDOMINAL HYSTERECTOMY    . INCISIONAL HERNIA REPAIR N/A 07/06/2015   Procedure: INCISIONAL HERNIA REPAIR ;  Surgeon: Rolm Bookbinder, MD;  Location: [facility];  Service: General;  Laterality: N/A;  . INSERTION OF MESH N/A 07/06/2015   Procedure: WITH INSERTION OF PHASIX ST MESH;  Surgeon: Rolm Bookbinder, MD;  Location: [facility];  Service: General;  Laterality: N/A;  . LAPAROTOMY N/A 07/06/2015   Procedure: EXPLORATORY LAPAROTOMY;  Surgeon: Everitt Amber, MD;  Location: [facility];  Service:  Gynecology;  Laterality: N/A;  . LYSIS OF ADHESION N/A 07/06/2015   Procedure:  LYSIS OF ADHESION RESECTION OF MESENTERIC MASS BOWEL RESECTION ;  Surgeon: Everitt Amber, MD;  Location: [facility];  Service: Gynecology;  Laterality: N/A;  . ROBOTIC ASSISTED TOTAL HYSTERECTOMY WITH BILATERAL SALPINGO OOPHERECTOMY Bilateral 07/28/2014   Procedure: ROBOTIC ASSISTED lysis of adhesions with biopsies, converted to LAPAROTOMY, bilateral salpingoorphorectomy, omentectomy,appendectomy;  Surgeon: Janie Morning, MD;  Location: [facility];  Service: Gynecology;  Laterality: Bilateral;  . THYROIDECTOMY, PARTIAL     Family History  Problem Relation Age of Onset  . Hypertension Mother   . Hypertension Father   . Diabetes Father   . Cancer Sister 50       fibrosarcoma (back); currently 45  . Prostate cancer Maternal Uncle   . Colon cancer Paternal Aunt        Dx 48s; deceased 59s  . Prostate cancer Paternal Uncle        currently 64  . Cancer Paternal Uncle 57       "bone" ; unk. primary  . Stomach cancer Paternal Uncle    Social History   Socioeconomic History  . Marital status: Married    Spouse name: Not on file  . Number of children: Not on file  . Years of education: Not on file  . Highest education level: Not on file  Social Needs  . Financial resource strain: Not on file  . Food insecurity - worry: Not on file  . Food insecurity - inability: Not on file  . Transportation needs - medical: Not on file  . Transportation needs - non-medical: Not on file  Occupational History  . Not on file  Tobacco Use  . Smoking status: Never Smoker  . Smokeless tobacco: Never Used  Substance and Sexual Activity  . Alcohol use: No  . Drug use: No  . Sexual activity: Not on file  Other Topics Concern  . Not on file  Social History Narrative  . Not on file    Review of systems: Constitutional:  She has no weight gain or weight loss. She has no fever or chills. Eyes: No blurred vision Ears, Nose, Mouth,  Throat: No dizziness, headaches or changes in hearing. No mouth sores. Cardiovascular: No chest pain, palpitations or edema. Respiratory:  No shortness of breath, wheezing or cough Gastrointestinal: She has normal bowel movements without diarrhea or constipation. She denies any nausea or vomiting. She denies blood in her stool or heart burn. Abdominal pain Genitourinary:  She denies pelvic pain, pelvic pressure or changes in her urinary function. She has no hematuria, dysuria, or incontinence. She has no irregular vaginal bleeding or vaginal discharge Musculoskeletal:  Denies muscle weakness or joint pains.  Skin:  She has no skin changes, rashes or itching Neurological:  Denies dizziness or headaches. + bilateral foot neuropathy Psychiatric:  She denies depression or anxiety. Hematologic/Lymphatic:   No easy bruising or bleeding   Physical Exam: Blood pressure 140/87, pulse 78, temperature 98.1 F (36.7 C), temperature source Oral, resp. rate 18, weight 231 lb 11.2 oz (105.1 kg), SpO2 98 %. General: Well dressed, well nourished in no apparent distress.   HEENT:  Normocephalic and atraumatic, no lesions.  Extraocular muscles intact. Sclerae anicteric. Pupils equal, round, reactive. No mouth sores or ulcers. Thyroid is normal size, not nodular, midline. Abdomen:  Soft, nontender, nondistended.  No palpable masses.  No hepatosplenomegaly.  No ascites. Normal bowel sounds. Incision is healed. 10cm soft, reducible midline upper abdominal ventral hernia. No erythema. Not tender to palpate. Genitourinary: No pelvic masses. Extremities: No cyanosis, clubbing or edema.  No calf tenderness or erythema. No palpable cords. Psychiatric: Mood and affect are appropriate. Neurological: Awake, alert and oriented x 3. Sensation is intact, no neuropathy.  Musculoskeletal: No pain, normal strength and range of motion.  Donaciano Eva, MD

## 2017-05-28 NOTE — Patient Instructions (Signed)
Please notify Dr [name] at phone number [phone] if you notice vaginal bleeding, new pelvic or abdominal pains, bloating, feeling full easy, or a change in bladder or bowel function.   Please return to see Dr [name] in 6 months for a lab check and examination.

## 2017-05-29 LAB — CA 125: Cancer Antigen (CA) 125: 19.9 U/mL (ref 0.0–38.1)

## 2017-06-04 ENCOUNTER — Telehealth: Payer: Self-pay | Admitting: Gynecologic Oncology

## 2017-06-04 NOTE — Telephone Encounter (Signed)
Patient informed of CA 125 results.  No concerns voiced.  Advised to call for any needs. 

## 2017-06-18 ENCOUNTER — Encounter: Payer: Self-pay | Admitting: Internal Medicine

## 2017-06-18 ENCOUNTER — Telehealth: Payer: Self-pay | Admitting: Internal Medicine

## 2017-06-18 ENCOUNTER — Ambulatory Visit: Payer: Medicare Other | Attending: Internal Medicine | Admitting: Internal Medicine

## 2017-06-18 VITALS — BP 122/83 | HR 82 | Temp 98.3°F | Resp 18 | Ht 65.0 in | Wt 226.0 lb

## 2017-06-18 DIAGNOSIS — I10 Essential (primary) hypertension: Secondary | ICD-10-CM | POA: Insufficient documentation

## 2017-06-18 DIAGNOSIS — Z9221 Personal history of antineoplastic chemotherapy: Secondary | ICD-10-CM | POA: Diagnosis not present

## 2017-06-18 DIAGNOSIS — E1140 Type 2 diabetes mellitus with diabetic neuropathy, unspecified: Secondary | ICD-10-CM | POA: Diagnosis not present

## 2017-06-18 DIAGNOSIS — Z800 Family history of malignant neoplasm of digestive organs: Secondary | ICD-10-CM | POA: Insufficient documentation

## 2017-06-18 DIAGNOSIS — Z9889 Other specified postprocedural states: Secondary | ICD-10-CM | POA: Insufficient documentation

## 2017-06-18 DIAGNOSIS — K449 Diaphragmatic hernia without obstruction or gangrene: Secondary | ICD-10-CM

## 2017-06-18 DIAGNOSIS — E119 Type 2 diabetes mellitus without complications: Secondary | ICD-10-CM | POA: Diagnosis not present

## 2017-06-18 DIAGNOSIS — E785 Hyperlipidemia, unspecified: Secondary | ICD-10-CM | POA: Insufficient documentation

## 2017-06-18 DIAGNOSIS — Z794 Long term (current) use of insulin: Secondary | ICD-10-CM | POA: Insufficient documentation

## 2017-06-18 DIAGNOSIS — H42 Glaucoma in diseases classified elsewhere: Secondary | ICD-10-CM | POA: Insufficient documentation

## 2017-06-18 DIAGNOSIS — K432 Incisional hernia without obstruction or gangrene: Secondary | ICD-10-CM | POA: Insufficient documentation

## 2017-06-18 DIAGNOSIS — Z8543 Personal history of malignant neoplasm of ovary: Secondary | ICD-10-CM | POA: Diagnosis not present

## 2017-06-18 DIAGNOSIS — C561 Malignant neoplasm of right ovary: Secondary | ICD-10-CM | POA: Diagnosis not present

## 2017-06-18 DIAGNOSIS — Z90722 Acquired absence of ovaries, bilateral: Secondary | ICD-10-CM | POA: Diagnosis not present

## 2017-06-18 DIAGNOSIS — M1990 Unspecified osteoarthritis, unspecified site: Secondary | ICD-10-CM | POA: Insufficient documentation

## 2017-06-18 DIAGNOSIS — K219 Gastro-esophageal reflux disease without esophagitis: Secondary | ICD-10-CM | POA: Insufficient documentation

## 2017-06-18 DIAGNOSIS — E1139 Type 2 diabetes mellitus with other diabetic ophthalmic complication: Secondary | ICD-10-CM | POA: Diagnosis not present

## 2017-06-18 DIAGNOSIS — Z888 Allergy status to other drugs, medicaments and biological substances status: Secondary | ICD-10-CM | POA: Diagnosis not present

## 2017-06-18 DIAGNOSIS — M545 Low back pain, unspecified: Secondary | ICD-10-CM

## 2017-06-18 DIAGNOSIS — Z79899 Other long term (current) drug therapy: Secondary | ICD-10-CM | POA: Insufficient documentation

## 2017-06-18 LAB — POCT GLYCOSYLATED HEMOGLOBIN (HGB A1C): Hemoglobin A1C: 8.6

## 2017-06-18 LAB — GLUCOSE, POCT (MANUAL RESULT ENTRY): POC GLUCOSE: 149 mg/dL — AB (ref 70–99)

## 2017-06-18 MED ORDER — GABAPENTIN 300 MG PO CAPS: 300.0000 mg | ORAL_CAPSULE | Freq: Three times a day (TID) | ORAL | 3 refills | Status: DC

## 2017-06-18 MED ORDER — LISINOPRIL-HYDROCHLOROTHIAZIDE 10-12.5 MG PO TABS: 1.0000 | ORAL_TABLET | Freq: Every day | ORAL | 3 refills | Status: DC

## 2017-06-18 MED ORDER — METFORMIN HCL 1000 MG PO TABS: 1000.0000 mg | ORAL_TABLET | Freq: Two times a day (BID) | ORAL | 3 refills | Status: DC

## 2017-06-18 MED ORDER — HYDRALAZINE HCL 25 MG PO TABS: 25.0000 mg | ORAL_TABLET | Freq: Three times a day (TID) | ORAL | 3 refills | Status: DC

## 2017-06-18 MED ORDER — ATORVASTATIN CALCIUM 20 MG PO TABS: 20.0000 mg | ORAL_TABLET | Freq: Every day | ORAL | 3 refills | Status: DC

## 2017-06-18 MED ORDER — ACETAMINOPHEN-CODEINE #3 300-30 MG PO TABS: 1.0000 | ORAL_TABLET | ORAL | 0 refills | Status: DC | PRN

## 2017-06-18 MED ORDER — METOPROLOL TARTRATE 25 MG PO TABS: ORAL_TABLET | ORAL | 3 refills | Status: DC

## 2017-06-18 MED ORDER — AMLODIPINE BESYLATE 10 MG PO TABS: 10.0000 mg | ORAL_TABLET | Freq: Every day | ORAL | 3 refills | Status: DC

## 2017-06-18 NOTE — Telephone Encounter (Signed)
Patient called requesting to speak with dr/ma. Patient stated 2 medications her and dr discussed during visit today are not listed on her AVS and wanted to speak with someone regarding so. Please fu with patient.

## 2017-06-18 NOTE — Progress Notes (Signed)
Sarah [name], is a 52 y.o. female  OMV:[id number]  [id number]:[id number]  DOB - 06-13-1965  Chief Complaint  Patient presents with  . Diabetes  . Hypertension      Subjective:   Sarah [name] is a 52 y.o. female with history of hypertension, Diabetes, hyperlipidemia andstage IIIc low-grade serous carcinoma of the ovaryS/p BSO, omentectomy, appendectomy on 07/28/14. S/p adjuvant chemotherapy with 6 cycles of paclitaxel and carboplatin completed on 12/08/14. S/p an ex lap, hernia repair and small bowel resection for a presumed recurrence (benign pathology)  She presents here today for a follow up visit HTN and Diabetes. Major complaint today is pain around her incisional hernia. She feels significant discomfort throughout her abdomen but no vomiting, no constipation, no abdominal distention. She is however doing well with her diabetes and BP control. She is adherence with her medications, reports no side effect. She has ongoing low back pain, unchanged from before. No urinary or fecal incontinence. Patient has No headache, No chest pain, No abdominal pain - No Nausea, No new weakness tingling or numbness, No Cough - SOB.  She needs refills on all her medications. She will call the surgical team about reevaluation for incisional herniorrhaphy.  Problem  Hiatal Hernia With Jerrye Bushy Without Esophagitis  Ovarian Cancer, Right (Hcc)    ALLERGIES: Allergies  Allergen Reactions  . Emend [Aprepitant] Other (See Comments)    Urticaria     PAST MEDICAL HISTORY: Past Medical History:  Diagnosis Date  . Arthritis   . Family history of colon cancer   . Glaucoma 02/16  . Glucagonoma 07/28/14   Pt denies this but states she has glaucoma  . History of chemotherapy   . Hypertension   . Imbalance   . Neuropathy    feet bilat  . Nocturia   . Peritoneal carcinomatosis ([place])    carcinoma of ovary   . Shortness of breath dyspnea    hx of 2013 - no problems currently   . Thyroid nodule    history  of   . Wears glasses     MEDICATIONS AT HOME: Prior to Admission medications   Medication Sig Start Date End Date Taking? Authorizing Provider  ACCU-CHEK SMARTVIEW test strip TID and a bedtime. E11.9 02/08/16  Yes Merland Holness E, MD  acetaminophen-codeine (TYLENOL #3) 300-30 MG tablet Take 1 tablet by mouth every 4 (four) hours as needed. 06/18/17  Yes Eira Alpert E, MD  amLODipine (NORVASC) 10 MG tablet Take 1 tablet (10 mg total) by mouth daily. 06/18/17  Yes Tresa Garter, MD  atorvastatin (LIPITOR) 20 MG tablet Take 1 tablet (20 mg total) by mouth daily. 06/18/17  Yes Tresa Garter, MD  blood glucose meter kit and supplies Dispense based on patient and insurance preference. Use up to four times daily as directed. (FOR ICD-9 250.00, 250.01). 07/12/15  Yes Cross, Melissa D, NP  Blood Glucose Monitoring Suppl (ACCU-CHEK AVIVA) device Use as instructed for once daily testing of blood sugar. E11.9 02/19/17 02/19/18 Yes Jermisha Hoffart, Marlena Clipper, MD  gabapentin (NEURONTIN) 300 MG capsule Take 1 capsule (300 mg total) by mouth 3 (three) times daily. 06/18/17  Yes Tresa Garter, MD  hydrALAZINE (APRESOLINE) 25 MG tablet Take 1 tablet (25 mg total) by mouth 3 (three) times daily. 06/18/17  Yes Tresa Garter, MD  ibuprofen (ADVIL,MOTRIN) 400 MG tablet Take 1 tablet (400 mg total) by mouth every 8 (eight) hours as needed. 05/20/16  Yes Alfonse Spruce, FNP  Insulin Pen Needle  31G X 5 MM MISC For use with lantus pen for injections once daily 07/12/15  Yes Cross, Melissa D, NP  Lancets (ACCU-CHEK MULTICLIX) lancets  07/12/15  Yes [provider]  latanoprost (XALATAN) 0.005 % ophthalmic solution Place 1 drop into both eyes at bedtime.   Yes [provider]  lisinopril-hydrochlorothiazide (PRINZIDE,ZESTORETIC) 10-12.5 MG tablet Take 1 tablet by mouth daily. 06/18/17  Yes Tresa Garter, MD  metFORMIN (GLUCOPHAGE) 1000 MG tablet Take 1 tablet (1,000 mg total) by  mouth 2 (two) times daily with a meal. 06/18/17  Yes Caryle Helgeson E, MD  metoprolol tartrate (LOPRESSOR) 25 MG tablet TAKE 1/2 TABLET BY MOUTH 2 TIMES A DAY 06/18/17  Yes Uriyah Massimo E, MD  timolol (TIMOPTIC) 0.5 % ophthalmic solution Place 1 drop into both eyes daily.   Yes [provider]    Objective:   Vitals:   06/18/17 1037  BP: 122/83  Pulse: 82  Resp: 18  Temp: 98.3 F (36.8 C)  TempSrc: Oral  SpO2: 95%  Weight: 226 lb (102.5 kg)  Height: _0  (1.651 m)   Exam General appearance : Awake, alert, not in any distress. Speech Clear. Not toxic looking, obese HEENT: Atraumatic and Normocephalic, pupils equally reactive to light and accomodation Neck: Supple, no JVD. No cervical lymphadenopathy.  Chest: Good air entry bilaterally, no added sounds  CVS: S1 S2 regular, no murmurs.  Abdomen: Multiple surgical scars, supra-umbilical incisional hernia, mildly tender but no obvious evidence of obstruction. Bowel sounds present, with no gaurding, rigidity or rebound. Extremities: B/L Lower Ext shows no edema, both legs are warm to touch Neurology: Awake alert, and oriented X 3, CN II-XII intact, Non focal Skin: No Rash  Data Review Lab Results  Component Value Date   HGBA1C 7.7 02/17/2017   HGBA1C 7.3 11/13/2016   HGBA1C 7.3 08/14/2016    Assessment & Plan   1. Type 2 diabetes mellitus without complication, without long-term current use of insulin (HCC) Refill - HgB A1c - Glucose (CBG) - metFORMIN (GLUCOPHAGE) 1000 MG tablet; Take 1 tablet (1,000 mg total) by mouth 2 (two) times daily with a meal.  Dispense: 180 tablet; Refill: 3 - metoprolol tartrate (LOPRESSOR) 25 MG tablet; TAKE 1/2 TABLET BY MOUTH 2 TIMES A DAY  Dispense: 90 tablet; Refill: 3  2. Essential hypertension Refill - amLODipine (NORVASC) 10 MG tablet; Take 1 tablet (10 mg total) by mouth daily.  Dispense: 90 tablet; Refill: 3 - gabapentin (NEURONTIN) 300 MG capsule; Take 1 capsule (300 mg  total) by mouth 3 (three) times daily.  Dispense: 90 capsule; Refill: 3 - hydrALAZINE (APRESOLINE) 25 MG tablet; Take 1 tablet (25 mg total) by mouth 3 (three) times daily.  Dispense: 90 tablet; Refill: 3 - lisinopril-hydrochlorothiazide (PRINZIDE,ZESTORETIC) 10-12.5 MG tablet; Take 1 tablet by mouth daily.  Dispense: 90 tablet; Refill: 3  3. Dyslipidemia Refill - atorvastatin (LIPITOR) 20 MG tablet; Take 1 tablet (20 mg total) by mouth daily.  Dispense: 90 tablet; Refill: 3  4. Ovarian cancer, right (HCC)  - CA-125 still within normal range - Continue follow up with Oncologist  5. Incisional hernia, without obstruction or gangrene  - Please make appointment with the general surgeon for evaluation and possible repair  6. Midline low back pain without sciatica  - acetaminophen-codeine (TYLENOL #3) 300-30 MG tablet; Take 1 tablet by mouth every 4 (four) hours as needed.  Dispense: 90 tablet; Refill: 0  Patient have been counseled extensively about nutrition and exercise. Other issues  discussed during this visit include: low cholesterol diet, weight control and daily exercise, foot care, annual eye examinations at Ophthalmology, importance of adherence with medications and regular follow-up. We also discussed long term complications of uncontrolled diabetes and hypertension.   Return in about 3 months (around 09/16/2017) for Hemoglobin A1C and Follow up, DM, Follow up HTN, Routine Follow Up.  The patient was given clear instructions to go to ER or return to medical center if symptoms don't improve, worsen or new problems develop. The patient verbalized understanding. The patient was told to call to get lab results if they haven't heard anything in the next week.   This note has been created with Surveyor, quantity. Any transcriptional errors are unintentional.    Angelica Chessman, MD, MHA, Karilyn Cota, [place] [facility] [place], [place]   06/18/2017, 11:39 AM

## 2017-06-18 NOTE — Patient Instructions (Signed)
Ovarian Cancer The ovaries are the parts of the female reproductive system that produce eggs. Women have two ovaries. They are located on either side of your uterus. Ovarian cancer is an abnormal growth of tissue (tumor) in one or both ovaries that is cancerous (malignant). Unlike noncancerous (benign) tumors, malignant tumors can spread to other parts of your body. What are the causes? The cause of ovarian cancer is unknown. What increases the risk? There are a number of risk factors that can increase your chances of getting ovarian cancer:  Being 31 years or older.  Having a personal or family history of endometrial, colon, breast, or ovarian cancer.  Having the BRCA1 and BRCA2 genes.  Using fertility medicines.  Starting menstruation before the age of 23 years.  Starting menopause after the age of 90 years.  Becoming pregnant for the first time at 74 years or older.  Never being pregnant.  Having hormone replacement therapy.  Being obese.  Having a personal history of endometriosis.  What are the signs or symptoms? Early ovarian cancer often does not cause symptoms. As the cancer grows, symptoms may include:  Unexplained weight loss.  Abdominal pain, swelling, or bloating.  Pain and pressure in your back and pelvis.  Abnormal vaginal bleeding.  Loss of appetite.  Frequent urination.  Pain during sex.  Fatigue.  How is this diagnosed? Your health care provider will ask about your medical history. He or she may also perform a number of tests, such as:  A pelvic exam. Your health care provider will feel the organs in the pelvis for any lumps or changes in their shape or size.  Imaging tests, such as CT scans, ultrasound exams, or MRI.  Blood tests.  Your cancer will be staged to determine its severity and extent. Staging is performed by your health care provider during surgery when the ovarian cancer is removed. Staging is done to find out the size of the  tumor, whether the cancer has spread, and if so, to what parts of your body. You may need to have more tests to determine the stage of your cancer:  Stage I-The cancer is found in only one or both ovaries.  Stage II-The cancer has spread to other parts of the pelvis, such as the uterus or fallopian tubes.  Stage III-The cancer has spread outside the pelvis to the abdominal cavity or to the lymph nodes in the abdomen.  Stage IV-The cancer has spread outside the abdomen to areas such as the liver or lungs.  How is this treated? Women with ovarian cancer can be treated with surgery, chemotherapy, radiation, or any combination of the three. If the cancer is found at an early stage, surgery may be done to remove one ovary and its fallopian tube. For more advanced cases, surgery may be done to remove the uterus, cervix, fallopian tubes, and ovaries. Your health care provider may also remove the lymph nodes near the tumor and some tissue in the abdomen. Chemotherapy is the use of drugs to kill cancer cells. Chemotherapy may be used before or after the surgery. Radiation, the use of high energy X-rays to kill cancer cells, may be used in certain instances. Follow these instructions at home:  Take medicines only as directed by your health care provider.  Consider joining a support group. If you are feeling stressed because you have ovarian cancer, a support group may help you cope with the disease.  Seek advice to help you manage the treatment of side  effects.  Keep all follow-up visits as directed by your health care provider. This is important. Contact a health care provider if:  You have a dull ache in your lower abdomen or groin.  You have increased abdominal pain, swelling, or bloating.  You have changes in your bowel or bladder function. This information is not intended to replace advice given to you by your health care provider. Make sure you discuss any questions you have with your health  care provider. Document Released: 04/16/2004 Document Revised: 10/30/2015 Document Reviewed: 07/23/2013 Elsevier Interactive Patient Education  2018 [place] for Gastroesophageal Reflux Disease, Adult When you have gastroesophageal reflux disease (GERD), the foods you eat and your eating habits are very important. Choosing the right foods can help ease your discomfort. What guidelines do I need to follow?  Choose fruits, vegetables, whole grains, and low-fat dairy products.  Choose low-fat meat, fish, and poultry.  Limit fats such as oils, salad dressings, butter, nuts, and avocado.  Keep a food diary. This helps you identify foods that cause symptoms.  Avoid foods that cause symptoms. These may be different for everyone.  Eat small meals often instead of 3 large meals a day.  Eat your meals slowly, in a place where you are relaxed.  Limit fried foods.  Cook foods using methods other than frying.  Avoid drinking alcohol.  Avoid drinking large amounts of liquids with your meals.  Avoid bending over or lying down until 2-3 hours after eating. What foods are not recommended? These are some foods and drinks that may make your symptoms worse: Vegetables Tomatoes. Tomato juice. Tomato and spaghetti sauce. Chili peppers. Onion and garlic. Horseradish. Fruits Oranges, grapefruit, and lemon (fruit and juice). Meats High-fat meats, fish, and poultry. This includes hot dogs, ribs, ham, sausage, salami, and bacon. Dairy Whole milk and chocolate milk. Sour cream. Cream. Butter. Ice cream. Cream cheese. Drinks Coffee and tea. Bubbly (carbonated) drinks or energy drinks. Condiments Hot sauce. Barbecue sauce. Sweets/Desserts Chocolate and cocoa. Donuts. Peppermint and spearmint. Fats and Oils High-fat foods. This includes [place] fries and potato chips. Other Vinegar. Strong spices. This includes black pepper, white pepper, red pepper, cayenne, curry powder, cloves,  ginger, and chili powder. The items listed above may not be a complete list of foods and drinks to avoid. Contact your dietitian for more information. This information is not intended to replace advice given to you by your health care provider. Make sure you discuss any questions you have with your health care provider. Document Released: 11/26/2011 Document Revised: 11/02/2015 Document Reviewed: 03/31/2013 Elsevier Interactive Patient Education  2017 [employer].

## 2017-06-18 NOTE — Telephone Encounter (Signed)
Left message on [phone] to return call.

## 2017-06-19 ENCOUNTER — Telehealth: Payer: Self-pay | Admitting: Internal Medicine

## 2017-06-19 NOTE — Telephone Encounter (Signed)
Patient needs to provide the specific name of the medication.

## 2017-06-19 NOTE — Telephone Encounter (Signed)
Pt called in regards to a prescription

## 2017-06-19 NOTE — Telephone Encounter (Signed)
Pt called in regards to a medication She states Doctor Doreene Burke was supposed to give her but did not receive. Please follow up

## 2017-06-25 NOTE — Telephone Encounter (Signed)
Pt. Called stating that her PCP told her to stop taking a medication but pt. Does not remember the name of the medication. Pt. Also states that the medication his PCP was going to prescribed her is for her heart burn and indigestion. Please f/u

## 2017-06-26 ENCOUNTER — Other Ambulatory Visit: Payer: Self-pay | Admitting: *Deleted

## 2017-06-26 MED ORDER — PANTOPRAZOLE SODIUM 40 MG PO TBEC: 40.0000 mg | DELAYED_RELEASE_TABLET | Freq: Every day | ORAL | 3 refills | Status: DC

## 2017-06-26 NOTE — Telephone Encounter (Signed)
Please advise on the medication you requested she stop and the medication she was requesting for heart burn.

## 2017-06-26 NOTE — Telephone Encounter (Signed)
Per PCP protonix sent to the pharmacy.

## 2017-07-22 DIAGNOSIS — K432 Incisional hernia without obstruction or gangrene: Secondary | ICD-10-CM | POA: Diagnosis not present

## 2017-08-26 DIAGNOSIS — H401132 Primary open-angle glaucoma, bilateral, moderate stage: Secondary | ICD-10-CM | POA: Diagnosis not present

## 2017-08-26 DIAGNOSIS — H53411 Scotoma involving central area, right eye: Secondary | ICD-10-CM | POA: Diagnosis not present

## 2017-08-26 DIAGNOSIS — E119 Type 2 diabetes mellitus without complications: Secondary | ICD-10-CM | POA: Diagnosis not present

## 2017-09-03 ENCOUNTER — Ambulatory Visit: Payer: Medicare Other | Attending: Internal Medicine | Admitting: Internal Medicine

## 2017-09-03 ENCOUNTER — Encounter: Payer: Self-pay | Admitting: Internal Medicine

## 2017-09-03 VITALS — BP 105/70 | HR 89 | Temp 98.2°F | Resp 18 | Ht 65.0 in | Wt 222.0 lb

## 2017-09-03 DIAGNOSIS — J069 Acute upper respiratory infection, unspecified: Secondary | ICD-10-CM

## 2017-09-03 DIAGNOSIS — H42 Glaucoma in diseases classified elsewhere: Secondary | ICD-10-CM | POA: Insufficient documentation

## 2017-09-03 DIAGNOSIS — E785 Hyperlipidemia, unspecified: Secondary | ICD-10-CM

## 2017-09-03 DIAGNOSIS — E119 Type 2 diabetes mellitus without complications: Secondary | ICD-10-CM

## 2017-09-03 DIAGNOSIS — C786 Secondary malignant neoplasm of retroperitoneum and peritoneum: Secondary | ICD-10-CM | POA: Insufficient documentation

## 2017-09-03 DIAGNOSIS — I10 Essential (primary) hypertension: Secondary | ICD-10-CM | POA: Diagnosis not present

## 2017-09-03 DIAGNOSIS — Z9221 Personal history of antineoplastic chemotherapy: Secondary | ICD-10-CM | POA: Diagnosis not present

## 2017-09-03 DIAGNOSIS — Z79899 Other long term (current) drug therapy: Secondary | ICD-10-CM | POA: Diagnosis not present

## 2017-09-03 DIAGNOSIS — Z800 Family history of malignant neoplasm of digestive organs: Secondary | ICD-10-CM | POA: Insufficient documentation

## 2017-09-03 DIAGNOSIS — Z8543 Personal history of malignant neoplasm of ovary: Secondary | ICD-10-CM | POA: Insufficient documentation

## 2017-09-03 DIAGNOSIS — E1139 Type 2 diabetes mellitus with other diabetic ophthalmic complication: Secondary | ICD-10-CM | POA: Insufficient documentation

## 2017-09-03 DIAGNOSIS — E1140 Type 2 diabetes mellitus with diabetic neuropathy, unspecified: Secondary | ICD-10-CM | POA: Diagnosis not present

## 2017-09-03 DIAGNOSIS — Z7984 Long term (current) use of oral hypoglycemic drugs: Secondary | ICD-10-CM | POA: Diagnosis not present

## 2017-09-03 DIAGNOSIS — H409 Unspecified glaucoma: Secondary | ICD-10-CM | POA: Diagnosis not present

## 2017-09-03 DIAGNOSIS — G4733 Obstructive sleep apnea (adult) (pediatric): Secondary | ICD-10-CM

## 2017-09-03 LAB — GLUCOSE, POCT (MANUAL RESULT ENTRY): POC Glucose: 153 mg/dl — AB (ref 70–99)

## 2017-09-03 LAB — POCT GLYCOSYLATED HEMOGLOBIN (HGB A1C): Hemoglobin A1C: 7.8

## 2017-09-03 MED ORDER — GUAIFENESIN-DM 100-10 MG/5ML PO SYRP: 10.0000 mL | ORAL_SOLUTION | ORAL | 0 refills | Status: DC | PRN

## 2017-09-03 MED ORDER — AMOXICILLIN-POT CLAVULANATE 875-125 MG PO TABS: 1.0000 | ORAL_TABLET | Freq: Two times a day (BID) | ORAL | 0 refills | Status: DC

## 2017-09-03 NOTE — Progress Notes (Signed)
Error

## 2017-09-03 NOTE — Patient Instructions (Signed)

## 2017-09-03 NOTE — Progress Notes (Signed)
Sarah [name], is a 52 y.o. female  [id number]  [id number]  DOB - [birth year]  Chief Complaint  Patient presents with  . Diabetes      Subjective:   Sarah [name] is a 52 y.o. female with history of hypertension, Diabetes, hyperlipidemia andstage IIIc low-grade serous carcinoma of the ovaryS/p BSO, omentectomy, appendectomy on 07/28/14. S/p adjuvant chemotherapy with 6 cycles of paclitaxel and carboplatin completed on 12/08/14. S/p an ex lap, hernia repair and small bowel resection for a presumed recurrence (benign pathology)  She is here today for a routine follow up visit. Patient is complaining of upper respiratory infection symptoms with cough, slight fever and running nose. She does not remember any sick contact. She feels significant discomfort because of her symptoms. She has not been sleeping well because of cough and stuffy nose. She snores loudly but not sure if she has sleep apnea. She does have daytime somnolence. She is adherent with her medications and blood sugar and blood pressure are well controlled. Patient has No headache, No chest pain, No abdominal pain - No Nausea, No new weakness tingling or numbness, SOB.  ALLERGIES: Allergies  Allergen Reactions  . Emend [Aprepitant] Other (See Comments)    Urticaria    PAST MEDICAL HISTORY: Past Medical History:  Diagnosis Date  . Arthritis   . Family history of colon cancer   . Glaucoma 02/16  . Glucagonoma 07/28/14   Pt denies this but states she has glaucoma  . History of chemotherapy   . Hypertension   . Imbalance   . Neuropathy    feet bilat  . Nocturia   . Peritoneal carcinomatosis ([place])    carcinoma of ovary   . Shortness of breath dyspnea    hx of 2013 - no problems currently   . Thyroid nodule    history of   . Wears glasses    MEDICATIONS AT HOME: Prior to Admission medications   Medication Sig Start Date End Date Taking? Authorizing Provider  ACCU-CHEK SMARTVIEW test strip TID and a  bedtime. E11.9 02/08/16  Yes Atari Novick E, MD  acetaminophen-codeine (TYLENOL #3) 300-30 MG tablet Take 1 tablet by mouth every 4 (four) hours as needed. 06/18/17  Yes Chivon Lepage E, MD  amLODipine (NORVASC) 10 MG tablet Take 1 tablet (10 mg total) by mouth daily. 06/18/17  Yes Tresa Garter, MD  atorvastatin (LIPITOR) 20 MG tablet Take 1 tablet (20 mg total) by mouth daily. 06/18/17  Yes Tresa Garter, MD  blood glucose meter kit and supplies Dispense based on patient and insurance preference. Use up to four times daily as directed. (FOR ICD-9 250.00, 250.01). 07/12/15  Yes Cross, Melissa D, NP  Blood Glucose Monitoring Suppl (ACCU-CHEK AVIVA) device Use as instructed for once daily testing of blood sugar. E11.9 02/19/17 02/19/18 Yes Loralie Malta, Marlena Clipper, MD  gabapentin (NEURONTIN) 300 MG capsule Take 1 capsule (300 mg total) by mouth 3 (three) times daily. 06/18/17  Yes Tresa Garter, MD  hydrALAZINE (APRESOLINE) 25 MG tablet Take 1 tablet (25 mg total) by mouth 3 (three) times daily. 06/18/17  Yes Tresa Garter, MD  ibuprofen (ADVIL,MOTRIN) 400 MG tablet Take 1 tablet (400 mg total) by mouth every 8 (eight) hours as needed. 05/20/16  Yes Fredia Beets R, FNP  Insulin Pen Needle 31G X 5 MM MISC For use with lantus pen for injections once daily 07/12/15  Yes Cross, Lenna Sciara D, NP  Lancets (ACCU-CHEK MULTICLIX) lancets  07/12/15  Yes [provider]  latanoprost (XALATAN) 0.005 % ophthalmic solution Place 1 drop into both eyes at bedtime.   Yes [provider]  lisinopril-hydrochlorothiazide (PRINZIDE,ZESTORETIC) 10-12.5 MG tablet Take 1 tablet by mouth daily. 06/18/17  Yes Tresa Garter, MD  metFORMIN (GLUCOPHAGE) 1000 MG tablet Take 1 tablet (1,000 mg total) by mouth 2 (two) times daily with a meal. 06/18/17  Yes Ebony Rickel E, MD  metoprolol tartrate (LOPRESSOR) 25 MG tablet TAKE 1/2 TABLET BY MOUTH 2 TIMES A DAY 06/18/17  Yes Brock Larmon  E, MD  pantoprazole (PROTONIX) 40 MG tablet Take 1 tablet (40 mg total) by mouth daily. 06/26/17  Yes Nikko Quast E, MD  timolol (TIMOPTIC) 0.5 % ophthalmic solution Place 1 drop into both eyes daily.   Yes [provider]  amoxicillin-clavulanate (AUGMENTIN) 875-125 MG tablet Take 1 tablet by mouth 2 (two) times daily. 09/03/17   Tresa Garter, MD  guaiFENesin-dextromethorphan (ROBITUSSIN DM) 100-10 MG/5ML syrup Take 10 mLs by mouth every 4 (four) hours as needed for cough. 09/03/17   Tresa Garter, MD   Objective:   Vitals:   09/03/17 0958  BP: 105/70  Pulse: 89  Resp: 18  Temp: 98.2 F (36.8 C)  TempSrc: Oral  SpO2: 96%  Weight: 222 lb (100.7 kg)  Height: '5\' 5"'$  (1.651 m)   Exam General appearance : Awake, alert, not in any distress. Speech Clear. Not toxic looking HEENT: Atraumatic and Normocephalic, pupils equally reactive to light and accomodation Neck: Supple, no JVD. No cervical lymphadenopathy.  Chest: Good air entry bilaterally, no added sounds  CVS: S1 S2 regular, no murmurs.  Abdomen: Bowel sounds present, Non tender and not distended with no gaurding, rigidity or rebound. Extremities: B/L Lower Ext shows no edema, both legs are warm to touch Neurology: Awake alert, and oriented X 3, CN II-XII intact, Non focal Skin: No Rash  Data Review Lab Results  Component Value Date   HGBA1C 7.8 09/03/2017   HGBA1C 8.6 06/18/2017   HGBA1C 7.7 02/17/2017   Assessment & Plan   1. Type 2 diabetes mellitus without complication, without long-term current use of insulin (HCC)  - HgB A1c is 7.8% today, better than 3 months ago - Glucose (CBG)  2. Essential hypertension We have discussed target BP range and blood pressure goal. I have advised patient to check BP regularly and to call us back or report to clinic if the numbers are consistently higher than 140/90. We discussed the importance of compliance with medical therapy and DASH diet recommended,  consequences of uncontrolled hypertension discussed.  - continue current BP medications  3. Dyslipidemia  To address this please limit saturated fat to no more than 7% of your calories, limit cholesterol to 200 mg/day, increase fiber and exercise as tolerated. If needed we may add another cholesterol lowering medication to your regimen.  The 10-year ASCVD risk score Mikey Bussing DC Brooke Bonito., et al., 2013) is: 6.5%   Values used to calculate the score:     Age: 63 years     Sex: Female     Is Non-Hispanic African American: Yes     Diabetic: Yes     Tobacco smoker: No     Systolic Blood Pressure: 174 mmHg     Is BP treated: Yes     HDL Cholesterol: 35 mg/dL     Total Cholesterol: 180 mg/dL  4. OSA (obstructive sleep apnea)  - Split night study; Future  5. Upper respiratory tract infection, unspecified type  -  amoxicillin-clavulanate (AUGMENTIN) 875-125 MG tablet; Take 1 tablet by mouth 2 (two) times daily.  Dispense: 14 tablet; Refill: 0 - guaiFENesin-dextromethorphan (ROBITUSSIN DM) 100-10 MG/5ML syrup; Take 10 mLs by mouth every 4 (four) hours as needed for cough.  Dispense: 480 mL; Refill: 0  Patient have been counseled extensively about nutrition and exercise. Other issues discussed during this visit include: low cholesterol diet, weight control and daily exercise, foot care, annual eye examinations at Ophthalmology, importance of adherence with medications and regular follow-up. We also discussed long term complications of uncontrolled diabetes and hypertension.   Return in about 3 months (around 12/04/2017) for Hemoglobin A1C and Follow up, DM, Follow up HTN.  The patient was given clear instructions to go to ER or return to medical center if symptoms don't improve, worsen or new problems develop. The patient verbalized understanding. The patient was told to call to get lab results if they haven't heard anything in the next week.   This note has been created with Administrator. Any transcriptional errors are unintentional.    Angelica Chessman, MD, MHA, Karilyn Cota, [place] [facility] [place], [place]   09/03/2017, 10:45 AM

## 2017-09-17 ENCOUNTER — Ambulatory Visit: Payer: Medicare Other | Admitting: Internal Medicine

## 2017-10-03 ENCOUNTER — Ambulatory Visit (HOSPITAL_BASED_OUTPATIENT_CLINIC_OR_DEPARTMENT_OTHER): Payer: Medicare Other | Attending: Internal Medicine | Admitting: Internal Medicine

## 2017-10-03 VITALS — Ht 65.0 in | Wt 222.0 lb

## 2017-10-03 DIAGNOSIS — G4733 Obstructive sleep apnea (adult) (pediatric): Secondary | ICD-10-CM | POA: Insufficient documentation

## 2017-10-11 DIAGNOSIS — G4733 Obstructive sleep apnea (adult) (pediatric): Secondary | ICD-10-CM | POA: Diagnosis not present

## 2017-10-11 NOTE — Procedures (Signed)
Patient Name: Sarah [name], Sarah [name] Date: 10/03/2017 Gender: Female D.O.B: [birth year] Age (years): 46 Referring Provider: Angelica Chessman Height (inches): 30 Interpreting Physician: Baird Lyons MD, ABSM Weight (lbs): 222 RPSGT: Earney [place] BMI: 37 MRN: [id number] Neck Size: 16.00  CLINICAL INFORMATION Sleep Study Type: Split Night CPAP Indication for sleep study: OSA  Epworth Sleepiness Score: 10  SLEEP STUDY TECHNIQUE As per the AASM Manual for the Scoring of Sleep and Associated Events v2.3 (April 2016) with a hypopnea requiring 4% desaturations.  The channels recorded and monitored were frontal, central and occipital EEG, electrooculogram (EOG), submentalis EMG (chin), nasal and oral airflow, thoracic and abdominal wall motion, anterior tibialis EMG, snore microphone, electrocardiogram, and pulse oximetry. Continuous positive airway pressure (CPAP) was initiated when the patient met split night criteria and was titrated according to treat sleep-disordered breathing.  MEDICATIONS Medications self-administered by patient taken the night of the study : none reported  RESPIRATORY PARAMETERS Diagnostic  Total AHI (/hr): 37.4 RDI (/hr): 45.6 OA Index (/hr): - CA Index (/hr): 0.0 REM AHI (/hr): 75.0 NREM AHI (/hr): 36.2 Supine AHI (/hr): 41.5 Non-supine AHI (/hr): 30.00 Min O2 Sat (%): 86.0 Mean O2 (%): 92.5 Time below 88% (min): 3   Titration  Optimal Pressure (cm): 12 AHI at Optimal Pressure (/hr): 0.0 Min O2 at Optimal Pressure (%): 95.0 Supine % at Optimal (%): 39 Sleep % at Optimal (%): 87   SLEEP ARCHITECTURE The recording time for the entire night was 378 minutes.  During a baseline period of 220.6 minutes, the patient slept for 125.0 minutes in REM and nonREM, yielding a sleep efficiency of 56.7%%. Sleep onset after lights out was 23.9 minutes with a REM latency of 90.0 minutes. The patient spent 7.6%% of the night in stage N1 sleep, 89.2%% in stage N2  sleep, 0.0%% in stage N3 and 3.2%% in REM.  During the titration period of 152.9 minutes, the patient slept for 127.6 minutes in REM and nonREM, yielding a sleep efficiency of 83.5%%. Sleep onset after CPAP initiation was 13.3 minutes with a REM latency of 58.0 minutes. The patient spent 2.4%% of the night in stage N1 sleep, 78.8%% in stage N2 sleep, 0.0%% in stage N3 and 18.8%% in REM.  CARDIAC DATA The 2 lead EKG demonstrated sinus rhythm. The mean heart rate was 100.0 beats per minute. Other EKG findings include: None.  LEG MOVEMENT DATA The total Periodic Limb Movements of Sleep (PLMS) were 0. The PLMS index was 0.0 .  IMPRESSIONS - Severe obstructive sleep apnea occurred during the diagnostic portion of the study (AHI = 37.4/hour). An optimal PAP pressure was selected for this patient ( 12 cm of water) - No significant central sleep apnea occurred during the diagnostic portion of the study (CAI = 0.0/hour). - Mild oxygen desaturation was noted during the diagnostic portion of the study (Min O2 = 86.0%). - The patient snored with moderate snoring volume during the diagnostic portion of the study. - No cardiac abnormalities were noted during this study. - Clinically significant periodic limb movements did not occur during sleep.  DIAGNOSIS - Obstructive Sleep Apnea (327.23 [G47.33 ICD-10])  RECOMMENDATIONS - Trial of CPAP therapy on 12 cm H2O or autopap 5-15.  Patient used a Small size [employer] Full Face Mask Simplus mask and heated humidification. - Avoid alcohol, sedatives and other CNS depressants that may worsen sleep apnea and disrupt normal sleep architecture. - Sleep hygiene should be reviewed to assess factors that may improve sleep quality.  [  Electronically signed] 10/11/2017 03:07 PM  Baird Lyons MD, [place], American Board of Sleep Medicine   NPI: [id number]                        [place]  ELECTRONICALLY SIGNED ON:  10/11/2017, 3:05 PM [place] PH: [phone]   FX: [phone] [place]

## 2017-10-27 ENCOUNTER — Other Ambulatory Visit: Payer: Self-pay | Admitting: Internal Medicine

## 2017-10-27 DIAGNOSIS — I10 Essential (primary) hypertension: Secondary | ICD-10-CM

## 2017-11-03 ENCOUNTER — Other Ambulatory Visit: Payer: Self-pay | Admitting: Internal Medicine

## 2017-11-03 DIAGNOSIS — I10 Essential (primary) hypertension: Secondary | ICD-10-CM

## 2017-11-12 ENCOUNTER — Telehealth: Payer: Self-pay | Admitting: *Deleted

## 2017-11-12 NOTE — Telephone Encounter (Signed)
Patient called back and moved her appt to June 10th

## 2017-11-12 NOTE — Telephone Encounter (Signed)
Called and left a message for the patient to cal the office back. Need to move the appt for June 19th

## 2017-11-17 ENCOUNTER — Encounter: Payer: Self-pay | Admitting: Gynecologic Oncology

## 2017-11-17 ENCOUNTER — Other Ambulatory Visit: Payer: Self-pay | Admitting: Gynecologic Oncology

## 2017-11-17 ENCOUNTER — Encounter: Payer: Self-pay | Admitting: Internal Medicine

## 2017-11-17 ENCOUNTER — Inpatient Hospital Stay: Payer: Medicare Other | Attending: Gynecologic Oncology | Admitting: Gynecologic Oncology

## 2017-11-17 ENCOUNTER — Inpatient Hospital Stay: Payer: Medicare Other

## 2017-11-17 ENCOUNTER — Ambulatory Visit: Payer: Medicare Other | Attending: Internal Medicine | Admitting: Internal Medicine

## 2017-11-17 VITALS — BP 122/82 | HR 77 | Temp 98.0°F | Resp 16 | Ht 65.0 in | Wt 221.4 lb

## 2017-11-17 VITALS — BP 133/80 | HR 79 | Temp 98.4°F | Resp 20 | Ht 65.0 in | Wt 221.4 lb

## 2017-11-17 DIAGNOSIS — E785 Hyperlipidemia, unspecified: Secondary | ICD-10-CM

## 2017-11-17 DIAGNOSIS — K439 Ventral hernia without obstruction or gangrene: Secondary | ICD-10-CM | POA: Diagnosis not present

## 2017-11-17 DIAGNOSIS — Z9889 Other specified postprocedural states: Secondary | ICD-10-CM | POA: Insufficient documentation

## 2017-11-17 DIAGNOSIS — C561 Malignant neoplasm of right ovary: Secondary | ICD-10-CM

## 2017-11-17 DIAGNOSIS — Z800 Family history of malignant neoplasm of digestive organs: Secondary | ICD-10-CM | POA: Insufficient documentation

## 2017-11-17 DIAGNOSIS — E1165 Type 2 diabetes mellitus with hyperglycemia: Secondary | ICD-10-CM | POA: Diagnosis not present

## 2017-11-17 DIAGNOSIS — Z9221 Personal history of antineoplastic chemotherapy: Secondary | ICD-10-CM | POA: Diagnosis not present

## 2017-11-17 DIAGNOSIS — Z791 Long term (current) use of non-steroidal anti-inflammatories (NSAID): Secondary | ICD-10-CM | POA: Diagnosis not present

## 2017-11-17 DIAGNOSIS — Z23 Encounter for immunization: Secondary | ICD-10-CM

## 2017-11-17 DIAGNOSIS — Z888 Allergy status to other drugs, medicaments and biological substances status: Secondary | ICD-10-CM | POA: Insufficient documentation

## 2017-11-17 DIAGNOSIS — K219 Gastro-esophageal reflux disease without esophagitis: Secondary | ICD-10-CM | POA: Insufficient documentation

## 2017-11-17 DIAGNOSIS — R252 Cramp and spasm: Secondary | ICD-10-CM | POA: Diagnosis not present

## 2017-11-17 DIAGNOSIS — I10 Essential (primary) hypertension: Secondary | ICD-10-CM | POA: Diagnosis not present

## 2017-11-17 DIAGNOSIS — E669 Obesity, unspecified: Secondary | ICD-10-CM | POA: Diagnosis not present

## 2017-11-17 DIAGNOSIS — Z79899 Other long term (current) drug therapy: Secondary | ICD-10-CM | POA: Diagnosis not present

## 2017-11-17 DIAGNOSIS — E1142 Type 2 diabetes mellitus with diabetic polyneuropathy: Secondary | ICD-10-CM

## 2017-11-17 DIAGNOSIS — Z90710 Acquired absence of both cervix and uterus: Secondary | ICD-10-CM | POA: Insufficient documentation

## 2017-11-17 DIAGNOSIS — Z8249 Family history of ischemic heart disease and other diseases of the circulatory system: Secondary | ICD-10-CM | POA: Diagnosis not present

## 2017-11-17 DIAGNOSIS — G4733 Obstructive sleep apnea (adult) (pediatric): Secondary | ICD-10-CM | POA: Diagnosis not present

## 2017-11-17 DIAGNOSIS — Z833 Family history of diabetes mellitus: Secondary | ICD-10-CM | POA: Diagnosis not present

## 2017-11-17 DIAGNOSIS — Z794 Long term (current) use of insulin: Secondary | ICD-10-CM | POA: Insufficient documentation

## 2017-11-17 DIAGNOSIS — E119 Type 2 diabetes mellitus without complications: Secondary | ICD-10-CM

## 2017-11-17 DIAGNOSIS — C569 Malignant neoplasm of unspecified ovary: Secondary | ICD-10-CM | POA: Diagnosis not present

## 2017-11-17 DIAGNOSIS — K449 Diaphragmatic hernia without obstruction or gangrene: Secondary | ICD-10-CM | POA: Insufficient documentation

## 2017-11-17 DIAGNOSIS — Z90722 Acquired absence of ovaries, bilateral: Secondary | ICD-10-CM

## 2017-11-17 DIAGNOSIS — Z79891 Long term (current) use of opiate analgesic: Secondary | ICD-10-CM | POA: Insufficient documentation

## 2017-11-17 DIAGNOSIS — IMO0002 Reserved for concepts with insufficient information to code with codable children: Secondary | ICD-10-CM

## 2017-11-17 DIAGNOSIS — Z8543 Personal history of malignant neoplasm of ovary: Secondary | ICD-10-CM | POA: Diagnosis not present

## 2017-11-17 LAB — GLUCOSE, POCT (MANUAL RESULT ENTRY): POC Glucose: 241 mg/dl — AB (ref 70–99)

## 2017-11-17 MED ORDER — ROSUVASTATIN CALCIUM 10 MG PO TABS: 10.0000 mg | ORAL_TABLET | Freq: Every day | ORAL | 6 refills | Status: DC

## 2017-11-17 MED ORDER — HYDRALAZINE HCL 25 MG PO TABS: 25.0000 mg | ORAL_TABLET | Freq: Three times a day (TID) | ORAL | 1 refills | Status: DC

## 2017-11-17 MED ORDER — TETANUS-DIPHTH-ACELL PERTUSSIS 5-2.5-18.5 LF-MCG/0.5 IM SUSP: 0.5000 mL | Freq: Once | INTRAMUSCULAR | 0 refills | Status: AC

## 2017-11-17 MED ORDER — GLIMEPIRIDE 1 MG PO TABS: 1.0000 mg | ORAL_TABLET | Freq: Every day | ORAL | 6 refills | Status: DC

## 2017-11-17 MED ORDER — TETANUS-DIPHTH-ACELL PERTUSSIS 5-2.5-18.5 LF-MCG/0.5 IM SUSP: 0.5000 mL | Freq: Once | INTRAMUSCULAR | Status: DC

## 2017-11-17 MED FILL — BOOSTRIX VACCINE SYRINGE: 5-2.5-18.5 | 1 days supply | Qty: 1 | Fill #0

## 2017-11-17 NOTE — Progress Notes (Signed)
Patient ID: Greidys Deland, female    DOB: 10-Oct-1965  MRN: [id number]  CC: re-establish; Diabetes; and Hypertension   Subjective: Sarah [name] is a 52 y.o. female who presents for chronic ds management and to est with me as PCP Her concerns today include:  DM with peripheral neuropathy, HTN, HL, OSA, midline LBP, stage IIIc low-grade serous carcinoma of the ovaryS/p BSO, omentectomy, appendectomy on 07/28/14. S/p adjuvant chemotherapy with 6 cycles of paclitaxel and carboplatin completed on 12/08/14. S/p an ex lap, hernia repair and small bowel resection for a presumed recurrence (benign pathology)  OSA: recent study revealed severe OSA.  She was waiting to see me today to get rxn for CPAP.  She does not drink.  Only potentially sedating med is gabapentin  DM:  BS fluctuates.  BS before BF 140s, after meals 190-200 Exercise:  Walks 2 x a wk for 30 mins for past 2 mths Eating habits:  trying to limit portion sizes.  Drinks mainly water and unsweet tea Last eye exam 09/2017.  Has glaucoma Endorses numbness in toes.  Does well on Gabapentin  HL:  Reports bothersome cramps in legs at nights for about 1 yr. Feels one of her meds is causing it  Ovarian CA:  Followed by Dr. [name]. Has appt with [name] today. Has ventral hernia.  Occasional pain associated with it.  Moving bowels okay.  No N/V.  Reports surgeon wants her to lose some wgh prior to surgical correction  Patient Active Problem List   Diagnosis Date Noted  . Hiatal hernia with GERD without esophagitis 06/18/2017  . Need for influenza vaccination 02/17/2017  . Neuropathy due to chemotherapeutic drug ([place]) 04/23/2016  . Neuropathy of both feet 04/23/2016  . Ventral hernia 01/24/2016  . Type 2 diabetes mellitus without complication, without long-term current use of insulin ([place]) 10/04/2015  . Obesity (BMI 30-39.9) 10/04/2015  . New onset type 2 diabetes mellitus ([place]) 07/27/2015  . Dyslipidemia 07/27/2015  . Neuropathic pain of  both legs 07/27/2015  . Ileus following gastrointestinal surgery ([place]) 07/08/2015  . Hyperglycemia 07/07/2015  . Obesity (BMI 30.0-34.9) 12/24/2014  . Genetic testing 11/01/2014  . Hypersensitivity reaction 10/27/2014  . Constipation - functional 09/24/2014  . Chemotherapy-induced peripheral neuropathy ([place]) 09/24/2014  . Chemotherapy induced nausea and vomiting 09/24/2014  . Leukopenia due to antineoplastic chemotherapy ([place]) 09/24/2014  . Midline low back pain without sciatica 09/24/2014  . Family history of colon cancer   . Leg pain 08/26/2014  . Primary peritoneal carcinomatosis ([place]) 08/21/2014  . Pelvic mass in female 07/28/2014  . Ovarian cancer, right ([place]) 07/08/2014  . Hilar adenopathy   . Hypertensive urgency 06/28/2014  . Adnexal mass   . Essential hypertension   . Pulmonary nodule      Current Outpatient Medications on File Prior to Visit  Medication Sig Dispense Refill  . ACCU-CHEK SMARTVIEW test strip TID and a bedtime. E11.9 100 each 12  . acetaminophen-codeine (TYLENOL #3) 300-30 MG tablet Take 1 tablet by mouth every 4 (four) hours as needed. 90 tablet 0  . amLODipine (NORVASC) 10 MG tablet Take 1 tablet (10 mg total) by mouth daily. 90 tablet 3  . blood glucose meter kit and supplies Dispense based on patient and insurance preference. Use up to four times daily as directed. (FOR ICD-9 250.00, 250.01). 1 each 0  . Blood Glucose Monitoring Suppl (ACCU-CHEK AVIVA) device Use as instructed for once daily testing of blood sugar. E11.9 1 each 0  . gabapentin (  NEURONTIN) 300 MG capsule TAKE 1 CAPSULE BY MOUTH THREE TIMES DAILY 360 capsule 0  . guaiFENesin-dextromethorphan (ROBITUSSIN DM) 100-10 MG/5ML syrup Take 10 mLs by mouth every 4 (four) hours as needed for cough. 480 mL 0  . ibuprofen (ADVIL,MOTRIN) 400 MG tablet Take 1 tablet (400 mg total) by mouth every 8 (eight) hours as needed. 24 tablet 0  . Insulin Pen Needle 31G X 5 MM MISC For use with lantus pen for  injections once daily 50 each 10  . Lancets (ACCU-CHEK MULTICLIX) lancets   0  . latanoprost (XALATAN) 0.005 % ophthalmic solution Place 1 drop into both eyes at bedtime.    Marland Kitchen lisinopril-hydrochlorothiazide (PRINZIDE,ZESTORETIC) 10-12.5 MG tablet Take 1 tablet by mouth daily. 90 tablet 3  . metFORMIN (GLUCOPHAGE) 1000 MG tablet Take 1 tablet (1,000 mg total) by mouth 2 (two) times daily with a meal. 180 tablet 3  . metoprolol tartrate (LOPRESSOR) 25 MG tablet TAKE 1/2 TABLET BY MOUTH 2 TIMES A DAY 90 tablet 3  . pantoprazole (PROTONIX) 40 MG tablet Take 1 tablet (40 mg total) by mouth daily. 30 tablet 3  . timolol (TIMOPTIC) 0.5 % ophthalmic solution Place 1 drop into both eyes daily.     No current facility-administered medications on file prior to visit.     Allergies  Allergen Reactions  . Emend [Aprepitant] Other (See Comments)    Urticaria     Social History   Socioeconomic History  . Marital status: Married    Spouse name: Not on file  . Number of children: Not on file  . Years of education: Not on file  . Highest education level: Not on file  Occupational History  . Not on file  Social Needs  . Financial resource strain: Not on file  . Food insecurity:    Worry: Not on file    Inability: Not on file  . Transportation needs:    Medical: Not on file    Non-medical: Not on file  Tobacco Use  . Smoking status: Never Smoker  . Smokeless tobacco: Never Used  Substance and Sexual Activity  . Alcohol use: No  . Drug use: No  . Sexual activity: Not on file  Lifestyle  . Physical activity:    Days per week: Not on file    Minutes per session: Not on file  . Stress: Not on file  Relationships  . Social connections:    Talks on phone: Not on file    Gets together: Not on file    Attends religious service: Not on file    Active member of club or organization: Not on file    Attends meetings of clubs or organizations: Not on file    Relationship status: Not on file    . Intimate partner violence:    Fear of current or ex partner: Not on file    Emotionally abused: Not on file    Physically abused: Not on file    Forced sexual activity: Not on file  Other Topics Concern  . Not on file  Social History Narrative  . Not on file    Family History  Problem Relation Age of Onset  . Hypertension Mother   . Hypertension Father   . Diabetes Father   . Cancer Sister 50       fibrosarcoma (back); currently 38  . Prostate cancer Maternal Uncle   . Colon cancer Paternal Aunt        Dx 27s; deceased 75s  .  Prostate cancer Paternal Uncle        currently 18  . Cancer Paternal Uncle 69       "bone" ; unk. primary  . Stomach cancer Paternal Uncle     Past Surgical History:  Procedure Laterality Date  . ABDOMINAL HYSTERECTOMY    . INCISIONAL HERNIA REPAIR N/A 07/06/2015   Procedure: INCISIONAL HERNIA REPAIR ;  Surgeon: Rolm Bookbinder, MD;  Location: [facility];  Service: General;  Laterality: N/A;  . INSERTION OF MESH N/A 07/06/2015   Procedure: WITH INSERTION OF PHASIX ST MESH;  Surgeon: Rolm Bookbinder, MD;  Location: [facility];  Service: General;  Laterality: N/A;  . LAPAROTOMY N/A 07/06/2015   Procedure: EXPLORATORY LAPAROTOMY;  Surgeon: Everitt Amber, MD;  Location: [facility];  Service: Gynecology;  Laterality: N/A;  . LYSIS OF ADHESION N/A 07/06/2015   Procedure:  LYSIS OF ADHESION RESECTION OF MESENTERIC MASS BOWEL RESECTION ;  Surgeon: Everitt Amber, MD;  Location: [facility];  Service: Gynecology;  Laterality: N/A;  . ROBOTIC ASSISTED TOTAL HYSTERECTOMY WITH BILATERAL SALPINGO OOPHERECTOMY Bilateral 07/28/2014   Procedure: ROBOTIC ASSISTED lysis of adhesions with biopsies, converted to LAPAROTOMY, bilateral salpingoorphorectomy, omentectomy,appendectomy;  Surgeon: Janie Morning, MD;  Location: [facility];  Service: Gynecology;  Laterality: Bilateral;  . THYROIDECTOMY, PARTIAL      ROS: Review of Systems Neg except as above  PHYSICAL EXAM: BP 122/82   Pulse 77    Temp 98 F (36.7 C) (Oral)   Resp 16   Ht '5\' 5"'$  (1.651 m)   Wt 221 lb 6.4 oz (100.4 kg)   SpO2 95%   BMI 36.84 kg/m   Wt Readings from Last 3 Encounters:  11/17/17 221 lb 6.4 oz (100.4 kg)  10/03/17 222 lb (100.7 kg)  09/03/17 222 lb (100.7 kg)    Physical Exam  General appearance - alert, well appearing, middle age AAF and in no distress Mental status - normal mood, behavior, speech, dress, motor activity, and thought processes Mouth - mucous membranes moist, pharynx normal without lesions Neck - supple, no significant adenopathy Chest - clear to auscultation, no wheezes, rales or rhonchi, symmetric air entry Heart - normal rate, regular rhythm, normal S1, S2, no murmurs, rubs, clicks or gallops Abdomen - large mid line hernia around umbilicus Extremities - peripheral pulses normal, no pedal edema, no clubbing or cyanosis Diabetic Foot Exam - Simple   Simple Foot Form Visual Inspection See comments:  Yes Sensation Testing Intact to touch and monofilament testing bilaterally:  Yes Pulse Check Posterior Tibialis and Dorsalis pulse intact bilaterally:  Yes Comments Noninflam bunions BL     Results for orders placed or performed in visit on 11/17/17  POCT glucose (manual entry)  Result Value Ref Range   POC Glucose 241 (A) 70 - 99 mg/dl   Lab Results  Component Value Date   HGBA1C 7.8 09/03/2017     ASSESSMENT AND PLAN: 1. Uncontrolled type 2 diabetes mellitus with peripheral neuropathy ([place]) Close to goal Continue healthy eating habits. Commended on regular exercise.  Try to inc to 3x/wk Add Amaryl. Went over [employer] goals before and 2 hrs post meals - POCT glucose (manual entry) - CBC - Comprehensive metabolic panel - Lipid panel - glimepiride (AMARYL) 1 MG tablet; Take 1 tablet (1 mg total) by mouth daily with breakfast.  Dispense: 30 tablet; Refill: 6  2. Essential hypertension At goal. Cont current meds - hydrALAZINE (APRESOLINE) 25 MG tablet; Take 1  tablet (25 mg total) by mouth 3 (  three) times daily.  Dispense: 360 tablet; Refill: 1  3. Dyslipidemia Given c/o cramps, change Lipitor to Crestor to see if she tolerates better - rosuvastatin (CRESTOR) 10 MG tablet; Take 1 tablet (10 mg total) by mouth daily. Stop Atorvastatin  Dispense: 30 tablet; Refill: 6  4. OSA (obstructive sleep apnea) Discussed sleep study results with pt.  Rxn given for CPAP machine and mask - For home use only DME continuous positive airway pressure (CPAP)  5. Ventral hernia without obstruction or gangrene -went over signs/symptoms that would signal bowel strangulation and need for urgent eval Avoid heavy lifting  6. History of ovarian cancer Keep appt with Dr. [name] today  7. Need for Tdap vaccination To be given by pharmacy  8. Cramps of lower extremity Likely statin induce. See #3 above   Patient was given the opportunity to ask questions.  Patient verbalized understanding of the plan and was able to repeat key elements of the plan.   Orders Placed This Encounter  Procedures  . For home use only DME continuous positive airway pressure (CPAP)  . CBC  . Comprehensive metabolic panel  . Lipid panel  . POCT glucose (manual entry)     Requested Prescriptions   Signed Prescriptions Disp Refills  . hydrALAZINE (APRESOLINE) 25 MG tablet 360 tablet 1    Sig: Take 1 tablet (25 mg total) by mouth 3 (three) times daily.  . rosuvastatin (CRESTOR) 10 MG tablet 30 tablet 6    Sig: Take 1 tablet (10 mg total) by mouth daily. Stop Atorvastatin  . glimepiride (AMARYL) 1 MG tablet 30 tablet 6    Sig: Take 1 tablet (1 mg total) by mouth daily with breakfast.  . Tdap (BOOSTRIX) 5-2.5-18.5 LF-MCG/0.5 injection 0.5 mL 0    Sig: Inject 0.5 mLs into the muscle once for 1 dose.    Return in about 3 months (around 02/17/2018).  Karle Plumber, MD, FACP

## 2017-11-17 NOTE — Patient Instructions (Addendum)
Stop Atorvastatin.  Start Crestor 10 mg daily instead.   Your diabetes is not well controlled.  We have added a medication called Amaryl 1 mg daily.  Continue to monitor blood sugars.  Blood sugar goal before meals is 90-130.  If you check two hours after a meal, the goal is less than 180.   Take the prescription for the CPAP machine to any medical supply store.

## 2017-11-17 NOTE — Progress Notes (Signed)
POST-TREATMENT FOLLOWUP. TREATMENT COUNSELING.  Assessment:   52 y.o. 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   S/p BSO, omentectomy, appendectomy on 07/28/14. S/p adjuvant chemotherapy with 6 cycles of paclitaxel and carboplatin. NED since 12/08/14. S/p an ex lap, hernia repair and small bowel resection for a presumed recurrence (benign pathology). Recurrent ventral hernia   No evidence of recurrent disease on exam,  recommend check CA 125 today.   Plan: 1) Recurrent ventral hernia: If no apparent recurrence, recommended she discuss her epigastric discomfort with [name] as this may be associated with her recurrent hernia. 2) Treatment counseling -  I recommend continuing routine surveillance with visit in 6 months with CA 125 assessments (CT scans on a prn basis for symptoms or rise in CA 125). I will see her back in December, 2019.  HPI:  Sarah [name] is a 52 y.o. year old initially seen in consultation on 07/08/14 for bilateral ovarian masses and ascites. This was diagnosed at the time of admission to the hospital for hypertensive crisis. This diagnosis of hypertension was new and she was newly started on a medical regimen for this. She then underwent an exploratory laparotomy, BSO, omentectomy, appendectomy on 07/28/14 with Dr Skeet Latch without complications.  Her postoperative course was uncomplicated.  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 This represented a stage IIIc disease process.  She received 6 cycles of adjuvant chemotherapy with paclitaxel and carboplatin with Dr Marko Plume on 08/25/14 to 12/08/14.  Pretreatment CA 125 was 58 on 06/30/14, and was 20 on 12/22/14 (it's nadir had been 16 on 4/14/ 16).  Post treatment CT of the chest, abdomen and pelvis on 01/12/15 revealed: Previously noted paratracheal, subcarinal and prevascular mediastinal  lymphadenopathy has resolved, with no residual pathologically enlarged mediastinal lymph nodes. Resolved hilar lymphadenopathy, with no residual pathologically enlarged mediastinal nodes. The previously described 3 mm subpleural left lower lobe pulmonary nodule is decreased to 2 mm (series 4/image 41). A separate 2 mm subpleural left lower lobe pulmonary nodule (4/29) is slightly decreased from 14m on 06/28/2014. A subpleural 3 mm right upper lobe pulmonary nodule (4/15) is unchanged from 06/28/2014. A 2 mm subpleural pulmonary nodule in the superior segment right lower lobe associated with the right major fissure (4/21) appears new. A 0.7 cm right posterior mesenteric node measures 0.7 cm short axis (series 2/image 93), decreased from 1.2 cm. No definite peritoneal tumor implants detected. A 0.4 cm nodule anterior/inferior to the spleen (series 2/ image 60) is mildly decreased from 0.7 cm. Small umbilical hernia containing a small bowel loop, with no evidence of small-bowel obstruction or strangulation.  She had a CA 125 on 03/27/15 that was normal and stable at 14.  A CT of the chest, abdomen and pelvis was performed on 04/27/15 which showed: No pulmonary mass, infiltrate, or effusion. Tiny less than 5 mm subpleural pulmonary nodules in the right upper lobe on image remain stable. No new or enlarging pulmonary nodules identified. Small left paraventral hernia containing bowel loops. New soft tissue nodularity is seen within the anterior abdominal mesenteric fat, with largest nodule measuring 10 x 17 mm. No other sites of peritoneal nodularity or soft tissue density seen.   On 07/06/15 she was taken to the OR for an ex lap, lysis of adhesions, and small bowel resection with ventral hernia repair. FIndings were remarkable for substantial small bowel adhesions, hwoever the mass that  had been seen on imaging was not appreciated intraperitoneally. There was white nodularity and stranding on the mesentery of a  segment of small bowel. It was completely resected with the small bowel resection. Pathology was benign.  Postoperatively she was noted to have very high blood glucose on acuchecks. An HbA1C was drawn on 07/07/15 and was very elevated at 10.1%. She was started on Insulin.  Her last CA 125 was 15 on 01/22/16 and 20.2 on 04/22/16 and 16.7 on 07/22/16 and 16.5 on 11/20/16.  Interval Hx:  CT abdo/pelvis for abdominal pains on 11/25/16 showed a large ventral hernia with unobstructed small bowel but no recurrence or metastases.  She has had symptomatic pains in her ventral hernia. Worse with sneezing and straining (working out). Not new. No symptoms concerning for recurrence.  Current Outpatient Medications on File Prior to Visit  Medication Sig Dispense Refill  . ACCU-CHEK SMARTVIEW test strip TID and a bedtime. E11.9 100 each 12  . acetaminophen-codeine (TYLENOL #3) 300-30 MG tablet Take 1 tablet by mouth every 4 (four) hours as needed. 90 tablet 0  . amLODipine (NORVASC) 10 MG tablet Take 1 tablet (10 mg total) by mouth daily. 90 tablet 3  . blood glucose meter kit and supplies Dispense based on patient and insurance preference. Use up to four times daily as directed. (FOR ICD-9 250.00, 250.01). 1 each 0  . Blood Glucose Monitoring Suppl (ACCU-CHEK AVIVA) device Use as instructed for once daily testing of blood sugar. E11.9 1 each 0  . gabapentin (NEURONTIN) 300 MG capsule TAKE 1 CAPSULE BY MOUTH THREE TIMES DAILY 360 capsule 0  . glimepiride (AMARYL) 1 MG tablet Take 1 tablet (1 mg total) by mouth daily with breakfast. 30 tablet 6  . guaiFENesin-dextromethorphan (ROBITUSSIN DM) 100-10 MG/5ML syrup Take 10 mLs by mouth every 4 (four) hours as needed for cough. 480 mL 0  . hydrALAZINE (APRESOLINE) 25 MG tablet Take 1 tablet (25 mg total) by mouth 3 (three) times daily. 360 tablet 1  . ibuprofen (ADVIL,MOTRIN) 400 MG tablet Take 1 tablet (400 mg total) by mouth every 8 (eight) hours as needed. 24 tablet  0  . Insulin Pen Needle 31G X 5 MM MISC For use with lantus pen for injections once daily 50 each 10  . Lancets (ACCU-CHEK MULTICLIX) lancets   0  . latanoprost (XALATAN) 0.005 % ophthalmic solution Place 1 drop into both eyes at bedtime.    Marland Kitchen lisinopril-hydrochlorothiazide (PRINZIDE,ZESTORETIC) 10-12.5 MG tablet Take 1 tablet by mouth daily. 90 tablet 3  . metFORMIN (GLUCOPHAGE) 1000 MG tablet Take 1 tablet (1,000 mg total) by mouth 2 (two) times daily with a meal. 180 tablet 3  . metoprolol tartrate (LOPRESSOR) 25 MG tablet TAKE 1/2 TABLET BY MOUTH 2 TIMES A DAY 90 tablet 3  . pantoprazole (PROTONIX) 40 MG tablet Take 1 tablet (40 mg total) by mouth daily. 30 tablet 3  . rosuvastatin (CRESTOR) 10 MG tablet Take 1 tablet (10 mg total) by mouth daily. Stop Atorvastatin 30 tablet 6  . Tdap (BOOSTRIX) 5-2.5-18.5 LF-MCG/0.5 injection Inject 0.5 mLs into the muscle once for 1 dose. 0.5 mL 0  . timolol (TIMOPTIC) 0.5 % ophthalmic solution Place 1 drop into both eyes daily.     No current facility-administered medications on file prior to visit.    Allergies  Allergen Reactions  . Emend [Aprepitant] Other (See Comments)    Urticaria    Past Medical History:  Diagnosis Date  . Arthritis   .  Family history of colon cancer   . Glaucoma 02/16  . Glucagonoma 07/28/14   Pt denies this but states she has glaucoma  . History of chemotherapy   . Hypertension   . Imbalance   . Neuropathy    feet bilat  . Nocturia   . Peritoneal carcinomatosis ([place])    carcinoma of ovary   . Shortness of breath dyspnea    hx of 2013 - no problems currently   . Thyroid nodule    history of   . Wears glasses    Past Surgical History:  Procedure Laterality Date  . ABDOMINAL HYSTERECTOMY    . INCISIONAL HERNIA REPAIR N/A 07/06/2015   Procedure: INCISIONAL HERNIA REPAIR ;  Surgeon: Rolm Bookbinder, MD;  Location: [facility];  Service: General;  Laterality: N/A;  . INSERTION OF MESH N/A 07/06/2015   Procedure:  WITH INSERTION OF PHASIX ST MESH;  Surgeon: Rolm Bookbinder, MD;  Location: [facility];  Service: General;  Laterality: N/A;  . LAPAROTOMY N/A 07/06/2015   Procedure: EXPLORATORY LAPAROTOMY;  Surgeon: Everitt Amber, MD;  Location: [facility];  Service: Gynecology;  Laterality: N/A;  . LYSIS OF ADHESION N/A 07/06/2015   Procedure:  LYSIS OF ADHESION RESECTION OF MESENTERIC MASS BOWEL RESECTION ;  Surgeon: Everitt Amber, MD;  Location: [facility];  Service: Gynecology;  Laterality: N/A;  . ROBOTIC ASSISTED TOTAL HYSTERECTOMY WITH BILATERAL SALPINGO OOPHERECTOMY Bilateral 07/28/2014   Procedure: ROBOTIC ASSISTED lysis of adhesions with biopsies, converted to LAPAROTOMY, bilateral salpingoorphorectomy, omentectomy,appendectomy;  Surgeon: Janie Morning, MD;  Location: [facility];  Service: Gynecology;  Laterality: Bilateral;  . THYROIDECTOMY, PARTIAL     Family History  Problem Relation Age of Onset  . Hypertension Mother   . Hypertension Father   . Diabetes Father   . Cancer Sister 50       fibrosarcoma (back); currently 13  . Prostate cancer Maternal Uncle   . Colon cancer Paternal Aunt        Dx 61s; deceased 37s  . Prostate cancer Paternal Uncle        currently 16  . Cancer Paternal Uncle 63       "bone" ; unk. primary  . Stomach cancer Paternal Uncle    Social History   Socioeconomic History  . Marital status: Married    Spouse name: Not on file  . Number of children: Not on file  . Years of education: Not on file  . Highest education level: Not on file  Occupational History  . Not on file  Social Needs  . Financial resource strain: Not on file  . Food insecurity:    Worry: Not on file    Inability: Not on file  . Transportation needs:    Medical: Not on file    Non-medical: Not on file  Tobacco Use  . Smoking status: Never Smoker  . Smokeless tobacco: Never Used  Substance and Sexual Activity  . Alcohol use: No  . Drug use: No  . Sexual activity: Not on file  Lifestyle  . Physical  activity:    Days per week: Not on file    Minutes per session: Not on file  . Stress: Not on file  Relationships  . Social connections:    Talks on phone: Not on file    Gets together: Not on file    Attends religious service: Not on file    Active member of club or organization: Not on file    Attends meetings of  clubs or organizations: Not on file    Relationship status: Not on file  . Intimate partner violence:    Fear of current or ex partner: Not on file    Emotionally abused: Not on file    Physically abused: Not on file    Forced sexual activity: Not on file  Other Topics Concern  . Not on file  Social History Narrative  . Not on file    Review of systems: Constitutional:  She has no weight gain or weight loss. She has no fever or chills. Eyes: No blurred vision Ears, Nose, Mouth, Throat: No dizziness, headaches or changes in hearing. No mouth sores. Cardiovascular: No chest pain, palpitations or edema. Respiratory:  No shortness of breath, wheezing or cough Gastrointestinal: She has normal bowel movements without diarrhea or constipation. She denies any nausea or vomiting. She denies blood in her stool or heart burn. Abdominal pain Genitourinary:  She denies pelvic pain, pelvic pressure or changes in her urinary function. She has no hematuria, dysuria, or incontinence. She has no irregular vaginal bleeding or vaginal discharge Musculoskeletal: Denies muscle weakness or joint pains.  Skin:  She has no skin changes, rashes or itching Neurological:  Denies dizziness or headaches. + bilateral foot neuropathy Psychiatric:  She denies depression or anxiety. Hematologic/Lymphatic:   No easy bruising or bleeding   Physical Exam: There were no vitals taken for this visit. General: Well dressed, well nourished in no apparent distress.   HEENT:  Normocephalic and atraumatic, no lesions.  Extraocular muscles intact. Sclerae anicteric. Pupils equal, round, reactive. No mouth  sores or ulcers. Thyroid is normal size, not nodular, midline. Abdomen:  Soft, nontender, nondistended.  No palpable masses.  No hepatosplenomegaly.  No ascites. Normal bowel sounds. Incision is healed. 10cm soft, reducible midline upper abdominal ventral hernia. No erythema. Not tender to palpate. Genitourinary: No pelvic masses. Extremities: No cyanosis, clubbing or edema.  No calf tenderness or erythema. No palpable cords. Psychiatric: Mood and affect are appropriate. Neurological: Awake, alert and oriented x 3. Sensation is intact, no neuropathy.  Musculoskeletal: No pain, normal strength and range of motion.  Thereasa Solo, MD

## 2017-11-17 NOTE — Patient Instructions (Signed)
Please notify Dr [name] at phone number [phone] if you notice vaginal bleeding, new pelvic or abdominal pains, bloating, feeling full easy, or a change in bladder or bowel function.   Please return to see Dr [name] in 6 months. She will draw your CA 125 at that time.  CA 125 was drawn today. It should be resulted tomorrow.

## 2017-11-17 NOTE — Addendum Note (Signed)
Addended by: Jackelyn Knife on: 11/17/2017 09:53 AM   Modules accepted: Orders

## 2017-11-18 ENCOUNTER — Telehealth: Payer: Self-pay | Admitting: *Deleted

## 2017-11-18 LAB — COMPREHENSIVE METABOLIC PANEL
ALBUMIN: 4.3 g/dL (ref 3.5–5.5)
ALT: 31 IU/L (ref 0–32)
AST: 29 IU/L (ref 0–40)
Albumin/Globulin Ratio: 1.4 (ref 1.2–2.2)
Alkaline Phosphatase: 65 IU/L (ref 39–117)
BILIRUBIN TOTAL: 0.6 mg/dL (ref 0.0–1.2)
BUN / CREAT RATIO: 15 (ref 9–23)
BUN: 15 mg/dL (ref 6–24)
CO2: 22 mmol/L (ref 20–29)
CREATININE: 1.03 mg/dL — AB (ref 0.57–1.00)
Calcium: 9.7 mg/dL (ref 8.7–10.2)
Chloride: 100 mmol/L (ref 96–106)
GFR calc non Af Amer: 63 mL/min/{1.73_m2} (ref >59)
GFR, EST AFRICAN AMERICAN: 73 mL/min/{1.73_m2} (ref >59)
GLOBULIN, TOTAL: 3.1 g/dL (ref 1.5–4.5)
GLUCOSE: 202 mg/dL — AB (ref 65–99)
Potassium: 4.1 mmol/L (ref 3.5–5.2)
Sodium: 142 mmol/L (ref 134–144)
TOTAL PROTEIN: 7.4 g/dL (ref 6.0–8.5)

## 2017-11-18 LAB — LIPID PANEL
Chol/HDL Ratio: 4.1 ratio (ref 0.0–4.4)
Cholesterol, Total: 153 mg/dL (ref 100–199)
HDL: 37 mg/dL — ABNORMAL LOW (ref >39)
LDL CALC: 75 mg/dL (ref 0–99)
Triglycerides: 204 mg/dL — ABNORMAL HIGH (ref 0–149)
VLDL CHOLESTEROL CAL: 41 mg/dL — AB (ref 5–40)

## 2017-11-18 LAB — CBC
HEMATOCRIT: 38 % (ref 34.0–46.6)
HEMOGLOBIN: 13 g/dL (ref 11.1–15.9)
MCH: 30.3 pg (ref 26.6–33.0)
MCHC: 34.2 g/dL (ref 31.5–35.7)
MCV: 89 fL (ref 79–97)
Platelets: 309 10*3/uL (ref 150–450)
RBC: 4.29 x10E6/uL (ref 3.77–5.28)
RDW: 15 % (ref 12.3–15.4)
WBC: 7 10*3/uL (ref 3.4–10.8)

## 2017-11-18 LAB — CA 125: CANCER ANTIGEN (CA) 125: 18.4 U/mL (ref 0.0–38.1)

## 2017-11-18 NOTE — Telephone Encounter (Signed)
Called patient to give her lab results for her CA -125, per Joylene John, NP CA 125 is 18.4, which is stable and within normal limits. Patient was very appreciative and verbalized understanding.

## 2017-11-26 ENCOUNTER — Other Ambulatory Visit: Payer: Medicare Other

## 2017-11-26 ENCOUNTER — Ambulatory Visit: Payer: Medicare Other | Admitting: Gynecologic Oncology

## 2017-11-28 DIAGNOSIS — H401132 Primary open-angle glaucoma, bilateral, moderate stage: Secondary | ICD-10-CM | POA: Diagnosis not present

## 2017-12-04 ENCOUNTER — Ambulatory Visit: Payer: Medicare Other | Admitting: Family Medicine

## 2018-02-05 ENCOUNTER — Other Ambulatory Visit: Payer: Self-pay

## 2018-02-05 DIAGNOSIS — I10 Essential (primary) hypertension: Secondary | ICD-10-CM

## 2018-02-06 MED ORDER — GABAPENTIN 300 MG PO CAPS: 300.0000 mg | ORAL_CAPSULE | Freq: Three times a day (TID) | ORAL | 0 refills | Status: DC

## 2018-02-20 ENCOUNTER — Ambulatory Visit: Payer: Medicare Other | Attending: Internal Medicine | Admitting: Internal Medicine

## 2018-02-20 ENCOUNTER — Encounter: Payer: Self-pay | Admitting: Internal Medicine

## 2018-02-20 VITALS — BP 127/84 | HR 87 | Temp 98.2°F | Resp 16 | Wt 225.2 lb

## 2018-02-20 DIAGNOSIS — Z6830 Body mass index (BMI) 30.0-30.9, adult: Secondary | ICD-10-CM | POA: Diagnosis not present

## 2018-02-20 DIAGNOSIS — Z79899 Other long term (current) drug therapy: Secondary | ICD-10-CM | POA: Insufficient documentation

## 2018-02-20 DIAGNOSIS — G4733 Obstructive sleep apnea (adult) (pediatric): Secondary | ICD-10-CM | POA: Diagnosis not present

## 2018-02-20 DIAGNOSIS — Z23 Encounter for immunization: Secondary | ICD-10-CM | POA: Diagnosis not present

## 2018-02-20 DIAGNOSIS — I10 Essential (primary) hypertension: Secondary | ICD-10-CM | POA: Diagnosis not present

## 2018-02-20 DIAGNOSIS — Z7984 Long term (current) use of oral hypoglycemic drugs: Secondary | ICD-10-CM | POA: Insufficient documentation

## 2018-02-20 DIAGNOSIS — E1165 Type 2 diabetes mellitus with hyperglycemia: Secondary | ICD-10-CM | POA: Diagnosis not present

## 2018-02-20 DIAGNOSIS — E669 Obesity, unspecified: Secondary | ICD-10-CM | POA: Insufficient documentation

## 2018-02-20 DIAGNOSIS — E1142 Type 2 diabetes mellitus with diabetic polyneuropathy: Secondary | ICD-10-CM | POA: Insufficient documentation

## 2018-02-20 DIAGNOSIS — IMO0002 Reserved for concepts with insufficient information to code with codable children: Secondary | ICD-10-CM

## 2018-02-20 DIAGNOSIS — E119 Type 2 diabetes mellitus without complications: Secondary | ICD-10-CM | POA: Diagnosis not present

## 2018-02-20 DIAGNOSIS — M21619 Bunion of unspecified foot: Secondary | ICD-10-CM | POA: Insufficient documentation

## 2018-02-20 LAB — POCT GLYCOSYLATED HEMOGLOBIN (HGB A1C): HBA1C, POC (CONTROLLED DIABETIC RANGE): 7.6 % — AB (ref 0.0–7.0)

## 2018-02-20 LAB — GLUCOSE, POCT (MANUAL RESULT ENTRY): POC Glucose: 208 mg/dl — AB (ref 70–99)

## 2018-02-20 MED ORDER — GLIMEPIRIDE 2 MG PO TABS: 2.0000 mg | ORAL_TABLET | Freq: Every day | ORAL | 6 refills | Status: DC

## 2018-02-20 MED ORDER — GABAPENTIN 300 MG PO CAPS: ORAL_CAPSULE | ORAL | 11 refills | Status: DC

## 2018-02-20 NOTE — Patient Instructions (Addendum)
Your diabetes is not well controlled.  Increase Amaryl to 2 mg daily. Try taking the cholesterol medication 4 times a week to see if it decreases the cramps in your feet. Increase gabapentin to 1 capsule in the morning and at noon and 2 capsules at bedtime. Please remember to [facility] your [facility]   Bunion A bunion is a bump on the base of the big toe that forms when the bones of the big toe joint move out of position. Bunions may be small at first, but they often get larger over time. The can make walking painful. What are the causes? A bunion may be caused by:  Wearing narrow or pointed shoes that force the big toe to press against the other toes.  Abnormal foot development that causes the foot to roll inward (pronate).  Changes in the foot that are caused by certain diseases, such as rheumatoid arthritis and polio.  A foot injury.  What increases the risk? The following factors may make you more likely to develop this condition:  Wearing shoes that squeeze the toes together.  Having certain diseases, such as: ? Rheumatoid arthritis. ? Polio. ? Cerebral palsy.  Having family members who have bunions.  Being born with a foot deformity, such as flat feet or low arches.  Doing activities that put a lot of pressure on the feet, such as ballet dancing.  What are the signs or symptoms? The main symptom of a bunion is a noticeable bump on the big toe. Other symptoms may include:  Pain.  Swelling around the big toe.  Redness and inflammation.  Thick or hardened skin on the big toe or between the toes.  Stiffness or loss of motion in the big toe.  Trouble with walking.  How is this diagnosed? A bunion may be diagnosed based on your symptoms, medical history, and activities. You may have tests, such as:  X-rays. These allow your health care provider to check the position of the bones in your foot and look for  damage to your joint. They also help your health care provider to determine the severity of your bunion and the best way to treat it.  Joint aspiration. In this test, a sample of fluid is removed from the toe joint. This test, which may be done if you are in a lot of pain, helps to rule out diseases that cause painful swelling of the joints, such as arthritis.  How is this treated? There is no cure for a bunion, but treatment can help to prevent a bunion from getting worse. Treatment depends on the severity of your symptoms. Your health care provider may recommend:  Wearing shoes that have a wide toe box.  Using bunion pads to cushion the affected area.  Taping your toes together to keep them in a normal position.  Placing a device inside your shoe (orthotics) to help reduce pressure on your toe joint.  Taking medicine to ease pain, inflammation, and swelling.  Applying heat or ice to the affected area.  Doing stretching exercises.  Surgery to remove scar tissue and move the toes back into their normal position. This treatment is rare.  Follow these instructions at home:  Support your toe joint with proper footwear, shoe padding, or taping as told by your health care provider.  Take over-the-counter and prescription medicines only as told by your health care provider.  If directed, apply ice to the injured  area: ? Put ice in a plastic bag. ? Place a towel between your skin and the bag. ? Leave the ice on for 20 minutes, 2-3 times per day.  If directed, apply heat to the affected area before you exercise. Use the heat source that your health care provider recommends, such as a moist heat pack or a heating pad. ? Place a towel between your skin and the heat source. ? Leave the heat on for 20-30 minutes. ? Remove the heat if your skin turns bright red. This is especially important if you are unable to feel pain, heat, or cold. You may have a greater risk of getting burned.  Do  exercises as told by your health care provider.  Keep all follow-up visits as told by your health care provider. Contact a health care provider if:  Your symptoms get worse.  Your symptoms do not improve in 2 weeks. Get help right away if:  You have severe pain and trouble with walking. This information is not intended to replace advice given to you by your health care provider. Make sure you discuss any questions you have with your health care provider. Document Released: 05/27/2005 Document Revised: 11/02/2015 Document Reviewed: 12/25/2014 Elsevier Interactive Patient Education  2018 [place].   Influenza Virus Vaccine injection (Fluarix) What is this medicine? INFLUENZA VIRUS VACCINE (in floo EN zuh VAHY ruhs vak SEEN) helps to reduce the risk of getting influenza also known as the flu. This medicine may be used for other purposes; ask your health care provider or pharmacist if you have questions. COMMON BRAND NAME(S): Fluarix, Fluzone What should I tell my health care provider before I take this medicine? They need to know if you have any of these conditions: -bleeding disorder like hemophilia -fever or infection -Guillain-Barre syndrome or other neurological problems -immune system problems -infection with the human immunodeficiency virus (HIV) or AIDS -low blood platelet counts -multiple sclerosis -an unusual or allergic reaction to influenza virus vaccine, eggs, chicken proteins, latex, gentamicin, other medicines, foods, dyes or preservatives -pregnant or trying to get pregnant -breast-feeding How should I use this medicine? This vaccine is for injection into a muscle. It is given by a health care professional. A copy of Vaccine Information Statements will be given before each vaccination. Read this sheet carefully each time. The sheet may change frequently. Talk to your pediatrician regarding the use of this medicine in children. Special care may be  needed. Overdosage: If you think you have taken too much of this medicine contact a poison control center or emergency room at once. NOTE: This medicine is only for you. Do not share this medicine with others. What if I miss a dose? This does not apply. What may interact with this medicine? -chemotherapy or radiation therapy -medicines that lower your immune system like etanercept, anakinra, infliximab, and adalimumab -medicines that treat or prevent blood clots like warfarin -phenytoin -steroid medicines like prednisone or cortisone -theophylline -vaccines This list may not describe all possible interactions. Give your health care provider a list of all the medicines, herbs, non-prescription drugs, or dietary supplements you use. Also tell them if you smoke, drink alcohol, or use illegal drugs. Some items may interact with your medicine. What should I watch for while using this medicine? Report any side effects that do not go away within 3 days to your doctor or health care professional. Call your health care provider if any unusual symptoms occur within 6 weeks of receiving this vaccine. You may  still catch the flu, but the illness is not usually as bad. You cannot get the flu from the vaccine. The vaccine will not protect against colds or other illnesses that may cause fever. The vaccine is needed every year. What side effects may I notice from receiving this medicine? Side effects that you should report to your doctor or health care professional as soon as possible: -allergic reactions like skin rash, itching or hives, swelling of the face, lips, or tongue Side effects that usually do not require medical attention (report to your doctor or health care professional if they continue or are bothersome): -fever -headache -muscle aches and pains -pain, tenderness, redness, or swelling at site where injected -weak or tired This list may not describe all possible side effects. Call your doctor  for medical advice about side effects. You may report side effects to FDA at 1-800-FDA-1088. Where should I keep my medicine? This vaccine is only given in a clinic, pharmacy, doctor's office, or other health care setting and will not be stored at home. NOTE: This sheet is a summary. It may not cover all possible information. If you have questions about this medicine, talk to your doctor, pharmacist, or health care provider.  2018 Elsevier/Gold Standard (2007-12-23 09:30:40)

## 2018-02-20 NOTE — Progress Notes (Signed)
Patient ID: Sarah [name], female    DOB: Jan 16, 1966  MRN: [id number]  CC: Hypertension and Diabetes   Subjective: Sarah [name] is a 52 y.o. female who presents for chronic chronic disease management. Her concerns today include:  DM with peripheral neuropathy, HTN, HL, OSA, midline LBP, stage IIIc low-grade serous carcinoma of the ovaryS/p BSO, omentectomy, appendectomy on 07/28/14. S/p adjuvant chemotherapy with 6 cycles of paclitaxel and carboplatin completed on 12/08/14. S/p an ex lap, hernia repair and small bowel resection for a presumed recurrence (benign pathology)  DM: Checks BS QOD - gives range in the 200s in the a.m; after dinner - 130-180.  Reports compliance with medications.  Amaryl was added on last visit. Obesity:  "I'm an emotional eater.  When I feel good I eat right and when I am feeling bad I eat everything."  Numbness and tingling in feet and cramps.  Interrupts sleep. Has form for handicap sticker Bunions sore and red.  Started bothering her when she was doing chemo.  "I dealt with it but now its getting worse."  Wears proper fitting shoes and avoids heels  OSA: given rxn for CPAP on last visit.  She did not take rxn in as yet.  She wants to check with her insurance to see what her co-pay will be.  HTN:  limits salt in the food.  She is compliant with medications.  No chest pains or shortness of breath.  Patient Active Problem List   Diagnosis Date Noted  . Hiatal hernia with GERD without esophagitis 06/18/2017  . Need for influenza vaccination 02/17/2017  . Neuropathy due to chemotherapeutic drug ([place]) 04/23/2016  . Neuropathy of both feet 04/23/2016  . Ventral hernia 01/24/2016  . Type 2 diabetes mellitus without complication, without long-term current use of insulin ([place]) 10/04/2015  . Obesity (BMI 30-39.9) 10/04/2015  . New onset type 2 diabetes mellitus ([place]) 07/27/2015  . Dyslipidemia 07/27/2015  . Neuropathic pain of both legs 07/27/2015  . Ileus  following gastrointestinal surgery ([place]) 07/08/2015  . Hyperglycemia 07/07/2015  . Obesity (BMI 30.0-34.9) 12/24/2014  . Genetic testing 11/01/2014  . Hypersensitivity reaction 10/27/2014  . Constipation - functional 09/24/2014  . Chemotherapy-induced peripheral neuropathy ([place]) 09/24/2014  . Chemotherapy induced nausea and vomiting 09/24/2014  . Leukopenia due to antineoplastic chemotherapy ([place]) 09/24/2014  . Midline low back pain without sciatica 09/24/2014  . Family history of colon cancer   . Leg pain 08/26/2014  . Primary peritoneal carcinomatosis ([place]) 08/21/2014  . Pelvic mass in female 07/28/2014  . Ovarian cancer, right ([place]) 07/08/2014  . Hilar adenopathy   . Hypertensive urgency 06/28/2014  . Adnexal mass   . Essential hypertension   . Pulmonary nodule      Current Outpatient Medications on File Prior to Visit  Medication Sig Dispense Refill  . ACCU-CHEK SMARTVIEW test strip TID and a bedtime. E11.9 100 each 12  . acetaminophen-codeine (TYLENOL #3) 300-30 MG tablet Take 1 tablet by mouth every 4 (four) hours as needed. 90 tablet 0  . amLODipine (NORVASC) 10 MG tablet Take 1 tablet (10 mg total) by mouth daily. 90 tablet 3  . blood glucose meter kit and supplies Dispense based on patient and insurance preference. Use up to four times daily as directed. (FOR ICD-9 250.00, 250.01). 1 each 0  . hydrALAZINE (APRESOLINE) 25 MG tablet Take 1 tablet (25 mg total) by mouth 3 (three) times daily. 360 tablet 1  . Insulin Pen Needle 31G X 5 MM MISC  For use with lantus pen for injections once daily 50 each 10  . Lancets (ACCU-CHEK MULTICLIX) lancets   0  . latanoprost (XALATAN) 0.005 % ophthalmic solution Place 1 drop into both eyes at bedtime.    Marland Kitchen lisinopril-hydrochlorothiazide (PRINZIDE,ZESTORETIC) 10-12.5 MG tablet Take 1 tablet by mouth daily. 90 tablet 3  . metFORMIN (GLUCOPHAGE) 1000 MG tablet Take 1 tablet (1,000 mg total) by mouth 2 (two) times daily with a meal. 180 tablet 3   . metoprolol tartrate (LOPRESSOR) 25 MG tablet TAKE 1/2 TABLET BY MOUTH 2 TIMES A DAY 90 tablet 3  . pantoprazole (PROTONIX) 40 MG tablet Take 1 tablet (40 mg total) by mouth daily. 30 tablet 3  . rosuvastatin (CRESTOR) 10 MG tablet Take 1 tablet (10 mg total) by mouth daily. Stop Atorvastatin 30 tablet 6  . timolol (TIMOPTIC) 0.5 % ophthalmic solution Place 1 drop into both eyes daily.     No current facility-administered medications on file prior to visit.     Allergies  Allergen Reactions  . Emend [Aprepitant] Other (See Comments)    Urticaria     Social History   Socioeconomic History  . Marital status: Married    Spouse name: Not on file  . Number of children: Not on file  . Years of education: Not on file  . Highest education level: Not on file  Occupational History  . Not on file  Social Needs  . Financial resource strain: Not on file  . Food insecurity:    Worry: Not on file    Inability: Not on file  . Transportation needs:    Medical: Not on file    Non-medical: Not on file  Tobacco Use  . Smoking status: Never Smoker  . Smokeless tobacco: Never Used  Substance and Sexual Activity  . Alcohol use: No  . Drug use: No  . Sexual activity: Not on file  Lifestyle  . Physical activity:    Days per week: Not on file    Minutes per session: Not on file  . Stress: Not on file  Relationships  . Social connections:    Talks on phone: Not on file    Gets together: Not on file    Attends religious service: Not on file    Active member of club or organization: Not on file    Attends meetings of clubs or organizations: Not on file    Relationship status: Not on file  . Intimate partner violence:    Fear of current or ex partner: Not on file    Emotionally abused: Not on file    Physically abused: Not on file    Forced sexual activity: Not on file  Other Topics Concern  . Not on file  Social History Narrative  . Not on file    Family History  Problem  Relation Age of Onset  . Hypertension Mother   . Hypertension Father   . Diabetes Father   . Cancer Sister 50       fibrosarcoma (back); currently 19  . Prostate cancer Maternal Uncle   . Colon cancer Paternal Aunt        Dx 12s; deceased 18s  . Prostate cancer Paternal Uncle        currently 72  . Cancer Paternal Uncle 55       "bone" ; unk. primary  . Stomach cancer Paternal Uncle     Past Surgical History:  Procedure Laterality Date  . ABDOMINAL HYSTERECTOMY    .  INCISIONAL HERNIA REPAIR N/A 07/06/2015   Procedure: INCISIONAL HERNIA REPAIR ;  Surgeon: Rolm Bookbinder, MD;  Location: [facility];  Service: General;  Laterality: N/A;  . INSERTION OF MESH N/A 07/06/2015   Procedure: WITH INSERTION OF PHASIX ST MESH;  Surgeon: Rolm Bookbinder, MD;  Location: [facility];  Service: General;  Laterality: N/A;  . LAPAROTOMY N/A 07/06/2015   Procedure: EXPLORATORY LAPAROTOMY;  Surgeon: Everitt Amber, MD;  Location: [facility];  Service: Gynecology;  Laterality: N/A;  . LYSIS OF ADHESION N/A 07/06/2015   Procedure:  LYSIS OF ADHESION RESECTION OF MESENTERIC MASS BOWEL RESECTION ;  Surgeon: Everitt Amber, MD;  Location: [facility];  Service: Gynecology;  Laterality: N/A;  . ROBOTIC ASSISTED TOTAL HYSTERECTOMY WITH BILATERAL SALPINGO OOPHERECTOMY Bilateral 07/28/2014   Procedure: ROBOTIC ASSISTED lysis of adhesions with biopsies, converted to LAPAROTOMY, bilateral salpingoorphorectomy, omentectomy,appendectomy;  Surgeon: Janie Morning, MD;  Location: [facility];  Service: Gynecology;  Laterality: Bilateral;  . THYROIDECTOMY, PARTIAL      ROS: Review of Systems Negative except as above.  PHYSICAL EXAM: BP 127/84   Pulse 87   Temp 98.2 F (36.8 C) (Oral)   Resp 16   Wt 225 lb 3.2 oz (102.2 kg)   SpO2 97%   BMI 37.48 kg/m   Wt Readings from Last 3 Encounters:  02/20/18 225 lb 3.2 oz (102.2 kg)  11/17/17 221 lb 6.4 oz (100.4 kg)  11/17/17 221 lb 6.4 oz (100.4 kg)    Physical Exam  General appearance -  alert, well appearing, middle-aged African-American female and in no distress Mental status - normal mood, behavior, speech, dress, motor activity, and thought processes Neck - supple, no significant adenopathy Chest - clear to auscultation, no wheezes, rales or rhonchi, symmetric air entry Heart - normal rate, regular rhythm, normal S1, S2, no murmurs, rubs, clicks or gallops Extremities - peripheral pulses normal, no pedal edema, no clubbing or cyanosis Diabetic Foot Exam - Simple   Simple Foot Form Visual Inspection See comments:  Yes Sensation Testing See comments:  Yes Pulse Check Posterior Tibialis and Dorsalis pulse intact bilaterally:  Yes Comments Patient with decreased sensation to touch on leap exam on the ball of the feet and toes. Mild inflamed bunions bilaterally.     Results for orders placed or performed in visit on 02/20/18  POCT glucose (manual entry)  Result Value Ref Range   POC Glucose 208 (A) 70 - 99 mg/dl  POCT glycosylated hemoglobin (Hb A1C)  Result Value Ref Range   Hemoglobin A1C     HbA1c POC (<> result, manual entry)     HbA1c, POC (prediabetic range)     HbA1c, POC (controlled diabetic range) 7.6 (A) 0.0 - 7.0 %    ASSESSMENT AND PLAN: 1. Uncontrolled type 2 diabetes mellitus with peripheral neuropathy ([place]) Discussed the importance of healthy eating habits, regular aerobic exercise (at least 150 minutes a week as tolerated) and medication compliance to achieve or maintain control of diabetes. -Continue metformin.  Increase Amaryl to 2 mg daily. -Given that her neuropathy symptoms are worse at night, we agreed to increase the evening dose of gabapentin to 600 mg. - POCT glucose (manual entry) - POCT glycosylated hemoglobin (Hb A1C) - Microalbumin / creatinine urine ratio - glimepiride (AMARYL) 2 MG tablet; Take 1 tablet (2 mg total) by mouth daily with breakfast.  Dispense: 30 tablet; Refill: 6 - gabapentin (NEURONTIN) 300 MG capsule; 1 capsule  p.o. every morning and q. noon and 2 tabs p.o. at  bedtime  Dispense: 120 capsule; Refill: 11  2. Essential hypertension Close to goal.  Continue metoprolol, lisinopril HCTZ and hydralazine  3. Bunion of great toe - Ambulatory referral to Podiatry  4. OSA (obstructive sleep apnea) Advised patient to take the prescription to any medical supply store and they can run it against her insurance and let her know what her out-of-pocket cost may be.  5. Need for immunization against influenza - Flu Vaccine QUAD 36+ mos IM   Patient was given the opportunity to ask questions.  Patient verbalized understanding of the plan and was able to repeat key elements of the plan.   Orders Placed This Encounter  Procedures  . Flu Vaccine QUAD 36+ mos IM  . Microalbumin / creatinine urine ratio  . Ambulatory referral to Podiatry  . POCT glucose (manual entry)  . POCT glycosylated hemoglobin (Hb A1C)     Requested Prescriptions   Signed Prescriptions Disp Refills  . glimepiride (AMARYL) 2 MG tablet 30 tablet 6    Sig: Take 1 tablet (2 mg total) by mouth daily with breakfast.  . gabapentin (NEURONTIN) 300 MG capsule 120 capsule 11    Sig: 1 capsule p.o. every morning and q. noon and 2 tabs p.o. at bedtime    Return in about 3 months (around 05/22/2018).  Karle Plumber, MD, FACP

## 2018-02-20 NOTE — Progress Notes (Signed)
Pt states at night her feet hurts worse. Pt states it's hard for her to sleep

## 2018-02-21 LAB — MICROALBUMIN / CREATININE URINE RATIO
CREATININE, UR: 121.9 mg/dL
Microalb/Creat Ratio: 228.6 mg/g creat — ABNORMAL HIGH (ref 0.0–30.0)
Microalbumin, Urine: 278.7 ug/mL

## 2018-02-25 ENCOUNTER — Telehealth: Payer: Self-pay

## 2018-02-25 NOTE — Telephone Encounter (Signed)
Contacted pt to go over lab results pt is aware of results and doesn't have any questions or concerns 

## 2018-02-26 DIAGNOSIS — H401132 Primary open-angle glaucoma, bilateral, moderate stage: Secondary | ICD-10-CM | POA: Diagnosis not present

## 2018-02-26 DIAGNOSIS — H53433 Sector or arcuate defects, bilateral: Secondary | ICD-10-CM | POA: Diagnosis not present

## 2018-03-03 ENCOUNTER — Encounter: Payer: Self-pay | Admitting: *Deleted

## 2018-03-03 ENCOUNTER — Ambulatory Visit (INDEPENDENT_AMBULATORY_CARE_PROVIDER_SITE_OTHER): Payer: Medicare Other

## 2018-03-03 ENCOUNTER — Ambulatory Visit (INDEPENDENT_AMBULATORY_CARE_PROVIDER_SITE_OTHER): Payer: Medicare Other | Admitting: Podiatry

## 2018-03-03 ENCOUNTER — Encounter: Payer: Self-pay | Admitting: Podiatry

## 2018-03-03 DIAGNOSIS — M2011 Hallux valgus (acquired), right foot: Secondary | ICD-10-CM

## 2018-03-03 DIAGNOSIS — M2012 Hallux valgus (acquired), left foot: Secondary | ICD-10-CM

## 2018-03-04 DIAGNOSIS — K432 Incisional hernia without obstruction or gangrene: Secondary | ICD-10-CM | POA: Diagnosis not present

## 2018-03-04 NOTE — Progress Notes (Signed)
Subjective:  Patient ID: Tea Collums, female    DOB: 09-02-1965,  MRN: [id number] HPI Chief Complaint  Patient presents with  . Foot Pain    1st MPJ bilateral (R>L)   "Its been bothering me more"    52 y.o. female presents with the above complaint.   ROS: Denies fever chills nausea vomiting muscle aches pains calf pain back pain chest pain shortness of breath.  Past Medical History:  Diagnosis Date  . Arthritis   . Family history of colon cancer   . Glaucoma 02/16  . Glucagonoma 07/28/14   Pt denies this but states she has glaucoma  . History of chemotherapy   . Hypertension   . Imbalance   . Neuropathy    feet bilat  . Nocturia   . Peritoneal carcinomatosis ([place])    carcinoma of ovary   . Shortness of breath dyspnea    hx of 2013 - no problems currently   . Thyroid nodule    history of   . Wears glasses    Past Surgical History:  Procedure Laterality Date  . ABDOMINAL HYSTERECTOMY    . INCISIONAL HERNIA REPAIR N/A 07/06/2015   Procedure: INCISIONAL HERNIA REPAIR ;  Surgeon: Rolm Bookbinder, MD;  [facility];  Service: General;  Laterality: N/A;  . INSERTION OF MESH N/A 07/06/2015   Procedure: WITH INSERTION OF PHASIX ST MESH;  Surgeon: Rolm Bookbinder, MD;  [facility];  Service: General;  Laterality: N/A;  . LAPAROTOMY N/A 07/06/2015   Procedure: EXPLORATORY LAPAROTOMY;  Surgeon: Everitt Amber, MD;  [facility];  Service: Gynecology;  Laterality: N/A;  . LYSIS OF ADHESION N/A 07/06/2015   Procedure:  LYSIS OF ADHESION RESECTION OF MESENTERIC MASS BOWEL RESECTION ;  Surgeon: Everitt Amber, MD;  [facility];  Service: Gynecology;  Laterality: N/A;  . ROBOTIC ASSISTED TOTAL HYSTERECTOMY WITH BILATERAL SALPINGO OOPHERECTOMY Bilateral 07/28/2014   Procedure: ROBOTIC ASSISTED lysis of adhesions with biopsies, converted to LAPAROTOMY, bilateral salpingoorphorectomy, omentectomy,appendectomy;  Surgeon: Janie Morning, MD;  Location[facility];  Service: Gynecology;   Laterality: Bilateral;  . THYROIDECTOMY, PARTIAL      Current Outpatient Medications:  .  ACCU-CHEK SMARTVIEW test strip, TID and a bedtime. E11.9, Disp: 100 each, Rfl: 12 .  acetaminophen-codeine (TYLENOL #3) 300-30 MG tablet, Take 1 tablet by mouth every 4 (four) hours as needed., Disp: 90 tablet, Rfl: 0 .  amLODipine (NORVASC) 10 MG tablet, Take 1 tablet (10 mg total) by mouth daily., Disp: 90 tablet, Rfl: 3 .  blood glucose meter kit and supplies, Dispense based on patient and insurance preference. Use up to four times daily as directed. (FOR ICD-9 250.00, 250.01)., Disp: 1 each, Rfl: 0 .  gabapentin (NEURONTIN) 300 MG capsule, 1 capsule p.o. every morning and q. noon and 2 tabs p.o. at bedtime, Disp: 120 capsule, Rfl: 11 .  glimepiride (AMARYL) 2 MG tablet, Take 1 tablet (2 mg total) by mouth daily with breakfast., Disp: 30 tablet, Rfl: 6 .  hydrALAZINE (APRESOLINE) 25 MG tablet, Take 1 tablet (25 mg total) by mouth 3 (three) times daily., Disp: 360 tablet, Rfl: 1 .  hydrochlorothiazide (HYDRODIURIL) 25 MG tablet, Take 25 mg by mouth daily., Disp: , Rfl: 3 .  Insulin Pen Needle 31G X 5 MM MISC, For use with lantus pen for injections once daily, Disp: 50 each, Rfl: 10 .  Lancets (ACCU-CHEK MULTICLIX) lancets, , Disp: , Rfl: 0 .  latanoprost (XALATAN) 0.005 % ophthalmic solution, Place 1 drop  into both eyes at bedtime., Disp: , Rfl:  .  lisinopril-hydrochlorothiazide (PRINZIDE,ZESTORETIC) 10-12.5 MG tablet, Take 1 tablet by mouth daily., Disp: 90 tablet, Rfl: 3 .  metFORMIN (GLUCOPHAGE) 1000 MG tablet, Take 1 tablet (1,000 mg total) by mouth 2 (two) times daily with a meal., Disp: 180 tablet, Rfl: 3 .  metoprolol tartrate (LOPRESSOR) 25 MG tablet, TAKE 1/2 TABLET BY MOUTH 2 TIMES A DAY, Disp: 90 tablet, Rfl: 3 .  pantoprazole (PROTONIX) 40 MG tablet, Take 1 tablet (40 mg total) by mouth daily., Disp: 30 tablet, Rfl: 3 .  rosuvastatin (CRESTOR) 10 MG tablet, Take 1 tablet (10 mg total) by  mouth daily. Stop Atorvastatin, Disp: 30 tablet, Rfl: 6 .  timolol (TIMOPTIC) 0.5 % ophthalmic solution, Place 1 drop into both eyes daily., Disp: , Rfl:   Allergies  Allergen Reactions  . Emend [Aprepitant] Other (See Comments)    Urticaria    Review of Systems Objective:  There were no vitals filed for this visit.  General: Well developed, nourished, in no acute distress, alert and oriented x3   Dermatological: Skin is warm, dry and supple bilateral. Nails x 10 are well maintained; remaining integument appears unremarkable at this time. There are no open sores, no preulcerative lesions, no rash or signs of infection present.  Vascular: Dorsalis Pedis artery and Posterior Tibial artery pedal pulses are 2/4 bilateral with immedate capillary fill time. Pedal hair growth present. No varicosities and no lower extremity edema present bilateral.   Neruologic: Grossly intact via light touch bilateral. Vibratory intact via tuning fork bilateral. Protective threshold with Semmes Wienstein monofilament intact to all pedal sites bilateral. Patellar and Achilles deep tendon reflexes 2+ bilateral. No Babinski or clonus noted bilateral.   Musculoskeletal: No gross boney pedal deformities bilateral. No pain, crepitus, or limitation noted with foot and ankle range of motion bilateral. Muscular strength 5/5 in all groups tested bilateral.  Hallux abductovalgus deformity reducible in nature without crepitation.  Gait: Unassisted, Nonantalgic.    Radiographs:  Radiographs taken today demonstrate an osseously mature individual with an increase in the first intermetatarsal angle greater than normal values and abnormal increase in the f hallux abductus angle on the first metatarsophalangeal joint.    Assessment & Plan:   Assessment: Painful hallux valgus.  Plan: Discussed etiology pathology conservative surgical therapies at this point time we consented her for an [facility] bunion repair with screw  fixation.  We discussed this in great detail discussed the possible postop complications the amount of time that may be needed for work we discussed complications such as postop pain bleeding swelling infection recurrence need further surgery.  She consented to this and signed the consent forms.  Dispensed cam walker.      T[place], [place]

## 2018-04-08 ENCOUNTER — Telehealth: Payer: Self-pay | Admitting: *Deleted

## 2018-04-08 NOTE — Telephone Encounter (Signed)
I am calling to let you know we have not received medical clearance from Dr. Karle Plumber.  I have sent her two requests.  I sent the first request on 03/03/2018 and the other was on 03/27/2018.  I'm getting ready to send it again.  If we do not get the medical clearance, we will have to cancel your surgery.  "I understand.  I will call her first thing in the morning."  I will follow-up as well.

## 2018-04-09 ENCOUNTER — Other Ambulatory Visit: Payer: Self-pay | Admitting: Podiatry

## 2018-04-09 ENCOUNTER — Encounter: Payer: Self-pay | Admitting: Internal Medicine

## 2018-04-09 ENCOUNTER — Ambulatory Visit: Payer: Medicare Other | Attending: Internal Medicine | Admitting: Internal Medicine

## 2018-04-09 ENCOUNTER — Telehealth: Payer: Self-pay | Admitting: Internal Medicine

## 2018-04-09 VITALS — BP 131/86 | HR 74 | Temp 98.0°F | Resp 16 | Wt 220.0 lb

## 2018-04-09 DIAGNOSIS — Z79899 Other long term (current) drug therapy: Secondary | ICD-10-CM | POA: Diagnosis not present

## 2018-04-09 DIAGNOSIS — Z6836 Body mass index (BMI) 36.0-36.9, adult: Secondary | ICD-10-CM | POA: Insufficient documentation

## 2018-04-09 DIAGNOSIS — E669 Obesity, unspecified: Secondary | ICD-10-CM | POA: Insufficient documentation

## 2018-04-09 DIAGNOSIS — G4733 Obstructive sleep apnea (adult) (pediatric): Secondary | ICD-10-CM

## 2018-04-09 DIAGNOSIS — Z8249 Family history of ischemic heart disease and other diseases of the circulatory system: Secondary | ICD-10-CM | POA: Insufficient documentation

## 2018-04-09 DIAGNOSIS — Z01818 Encounter for other preprocedural examination: Secondary | ICD-10-CM

## 2018-04-09 DIAGNOSIS — Z90710 Acquired absence of both cervix and uterus: Secondary | ICD-10-CM | POA: Diagnosis not present

## 2018-04-09 DIAGNOSIS — Z9221 Personal history of antineoplastic chemotherapy: Secondary | ICD-10-CM | POA: Insufficient documentation

## 2018-04-09 DIAGNOSIS — Z8543 Personal history of malignant neoplasm of ovary: Secondary | ICD-10-CM | POA: Diagnosis not present

## 2018-04-09 DIAGNOSIS — Z9889 Other specified postprocedural states: Secondary | ICD-10-CM | POA: Insufficient documentation

## 2018-04-09 DIAGNOSIS — Z794 Long term (current) use of insulin: Secondary | ICD-10-CM | POA: Diagnosis not present

## 2018-04-09 DIAGNOSIS — E119 Type 2 diabetes mellitus without complications: Secondary | ICD-10-CM | POA: Diagnosis not present

## 2018-04-09 DIAGNOSIS — E1142 Type 2 diabetes mellitus with diabetic polyneuropathy: Secondary | ICD-10-CM | POA: Diagnosis not present

## 2018-04-09 DIAGNOSIS — I10 Essential (primary) hypertension: Secondary | ICD-10-CM | POA: Diagnosis not present

## 2018-04-09 LAB — GLUCOSE, POCT (MANUAL RESULT ENTRY): POC GLUCOSE: 137 mg/dL — AB (ref 70–99)

## 2018-04-09 MED ORDER — CEPHALEXIN 500 MG PO CAPS: 500.0000 mg | ORAL_CAPSULE | Freq: Three times a day (TID) | ORAL | 0 refills | Status: DC

## 2018-04-09 MED ORDER — ONDANSETRON HCL 4 MG PO TABS: 4.0000 mg | ORAL_TABLET | Freq: Three times a day (TID) | ORAL | 0 refills | Status: DC | PRN

## 2018-04-09 MED ORDER — OXYCODONE-ACETAMINOPHEN 10-325 MG PO TABS: 1.0000 | ORAL_TABLET | Freq: Four times a day (QID) | ORAL | 0 refills | Status: AC | PRN

## 2018-04-09 NOTE — Progress Notes (Signed)
Patient ID: Sarah [name], female    DOB: [birth year]  MRN: [id number]  CC: Medical Clearance   Subjective: Sarah [name] is a 52 y.o. female who presents for pre-op eval.  Pt is schedule to havesurgery on bunion on left foot tomorrow. Her concerns today include:  DM with peripheral neuropathy, HTN, HL, OSA, midline LBP,stage IIIc low-grade serous carcinoma of the ovaryS/p BSO, omentectomy, appendectomy on 07/28/14. S/p adjuvant chemotherapy with 6 cycles of paclitaxel and carboplatin completed on 12/08/14. S/p an ex lap, hernia repair and small bowel resection for a presumed recurrence (benign pathology)  Pt is able to walk 4 blocks without any CP/SOB. Denies any problems with anesthesia on previous surgeries. She has hx of HTN.  Compliant with meds and salt restriction Denies CP/SOB/LE edema/PND/orthopnea/chronic HA/palpitations Checking BP once a day.  Gives range 130s/60s  OSA:  Can not afford her co-pay for CPAP machine  DM:  Checking BS BID.  Gives range 130s in a.m and at nights 120s Reports compliance with Metformin and Amaryl Doing better with eating habits Lab Results  Component Value Date   HGBA1C 7.6 (A) 02/20/2018     Patient Active Problem List   Diagnosis Date Noted  . Hiatal hernia with GERD without esophagitis 06/18/2017  . Need for influenza vaccination 02/17/2017  . Ventral hernia 01/24/2016  . Type 2 diabetes mellitus without complication, without long-term current use of insulin ([place]) 10/04/2015  . Obesity (BMI 30-39.9) 10/04/2015  . Dyslipidemia 07/27/2015  . Neuropathic pain of both legs 07/27/2015  . Obesity (BMI 30.0-34.9) 12/24/2014  . Genetic testing 11/01/2014  . Constipation - functional 09/24/2014  . Chemotherapy-induced peripheral neuropathy ([place]) 09/24/2014  . Midline low back pain without sciatica 09/24/2014  . Family history of colon cancer   . Primary peritoneal carcinomatosis ([place]) 08/21/2014  . Ovarian cancer, right ([place]) 07/08/2014    . Hilar adenopathy   . Essential hypertension   . Pulmonary nodule      Current Outpatient Medications on File Prior to Visit  Medication Sig Dispense Refill  . ACCU-CHEK SMARTVIEW test strip TID and a bedtime. E11.9 100 each 12  . acetaminophen-codeine (TYLENOL #3) 300-30 MG tablet Take 1 tablet by mouth every 4 (four) hours as needed. (Patient not taking: Reported on 04/09/2018) 90 tablet 0  . amLODipine (NORVASC) 10 MG tablet Take 1 tablet (10 mg total) by mouth daily. 90 tablet 3  . blood glucose meter kit and supplies Dispense based on patient and insurance preference. Use up to four times daily as directed. (FOR ICD-9 250.00, 250.01). (Patient not taking: Reported on 04/09/2018) 1 each 0  . gabapentin (NEURONTIN) 300 MG capsule 1 capsule p.o. every morning and q. noon and 2 tabs p.o. at bedtime 120 capsule 11  . glimepiride (AMARYL) 2 MG tablet Take 1 tablet (2 mg total) by mouth daily with breakfast. 30 tablet 6  . hydrALAZINE (APRESOLINE) 25 MG tablet Take 1 tablet (25 mg total) by mouth 3 (three) times daily. 360 tablet 1  . hydrochlorothiazide (HYDRODIURIL) 25 MG tablet Take 25 mg by mouth daily.  3  . Insulin Pen Needle 31G X 5 MM MISC For use with lantus pen for injections once daily (Patient not taking: Reported on 04/09/2018) 50 each 10  . Lancets (ACCU-CHEK MULTICLIX) lancets   0  . latanoprost (XALATAN) 0.005 % ophthalmic solution Place 1 drop into both eyes at bedtime.    Marland Kitchen lisinopril-hydrochlorothiazide (PRINZIDE,ZESTORETIC) 10-12.5 MG tablet Take 1 tablet by mouth daily. [place]  tablet 3  . metFORMIN (GLUCOPHAGE) 1000 MG tablet Take 1 tablet (1,000 mg total) by mouth 2 (two) times daily with a meal. 180 tablet 3  . metoprolol tartrate (LOPRESSOR) 25 MG tablet TAKE 1/2 TABLET BY MOUTH 2 TIMES A DAY 90 tablet 3  . rosuvastatin (CRESTOR) 10 MG tablet Take 1 tablet (10 mg total) by mouth daily. Stop Atorvastatin 30 tablet 6  . timolol (TIMOPTIC) 0.5 % ophthalmic solution Place 1  drop into both eyes daily.     No current facility-administered medications on file prior to visit.     Allergies  Allergen Reactions  . Emend [Aprepitant] Other (See Comments)    Urticaria     Social History   Socioeconomic History  . Marital status: Married    Spouse name: Not on file  . Number of children: Not on file  . Years of education: Not on file  . Highest education level: Not on file  Occupational History  . Not on file  Social Needs  . Financial resource strain: Not on file  . Food insecurity:    Worry: Not on file    Inability: Not on file  . Transportation needs:    Medical: Not on file    Non-medical: Not on file  Tobacco Use  . Smoking status: Never Smoker  . Smokeless tobacco: Never Used  Substance and Sexual Activity  . Alcohol use: No  . Drug use: No  . Sexual activity: Not on file  Lifestyle  . Physical activity:    Days per week: Not on file    Minutes per session: Not on file  . Stress: Not on file  Relationships  . Social connections:    Talks on phone: Not on file    Gets together: Not on file    Attends religious service: Not on file    Active member of club or organization: Not on file    Attends meetings of clubs or organizations: Not on file    Relationship status: Not on file  . Intimate partner violence:    Fear of current or ex partner: Not on file    Emotionally abused: Not on file    Physically abused: Not on file    Forced sexual activity: Not on file  Other Topics Concern  . Not on file  Social History Narrative  . Not on file    Family History  Problem Relation Age of Onset  . Hypertension Mother   . Hypertension Father   . Diabetes Father   . Cancer Sister 50       fibrosarcoma (back); currently 74  . Prostate cancer Maternal Uncle   . Colon cancer Paternal Aunt        Dx 53s; deceased 109s  . Prostate cancer Paternal Uncle        currently 14  . Cancer Paternal Uncle 24       "bone" ; unk. primary  .  Stomach cancer Paternal Uncle     Past Surgical History:  Procedure Laterality Date  . ABDOMINAL HYSTERECTOMY    . INCISIONAL HERNIA REPAIR N/A 07/06/2015   Procedure: INCISIONAL HERNIA REPAIR ;  Surgeon: Rolm Bookbinder, MD;  Location: [facility];  Service: General;  Laterality: N/A;  . INSERTION OF MESH N/A 07/06/2015   Procedure: WITH INSERTION OF PHASIX ST MESH;  Surgeon: Rolm Bookbinder, MD;  Location: [facility];  Service: General;  Laterality: N/A;  . LAPAROTOMY N/A 07/06/2015   Procedure: EXPLORATORY LAPAROTOMY;  Surgeon: Everitt Amber, MD;  Location: [facility];  Service: Gynecology;  Laterality: N/A;  . LYSIS OF ADHESION N/A 07/06/2015   Procedure:  LYSIS OF ADHESION RESECTION OF MESENTERIC MASS BOWEL RESECTION ;  Surgeon: Everitt Amber, MD;  Location: [facility];  Service: Gynecology;  Laterality: N/A;  . ROBOTIC ASSISTED TOTAL HYSTERECTOMY WITH BILATERAL SALPINGO OOPHERECTOMY Bilateral 07/28/2014   Procedure: ROBOTIC ASSISTED lysis of adhesions with biopsies, converted to LAPAROTOMY, bilateral salpingoorphorectomy, omentectomy,appendectomy;  Surgeon: Janie Morning, MD;  Location: [facility];  Service: Gynecology;  Laterality: Bilateral;  . THYROIDECTOMY, PARTIAL      ROS: Review of Systems  Constitutional: Negative for activity change and appetite change.  HENT: Negative for congestion.   Respiratory: Negative for chest tightness.   Cardiovascular: Negative for chest pain and leg swelling.  Genitourinary: Negative for difficulty urinating.  Neurological: Negative for dizziness.  Psychiatric/Behavioral: The patient is not nervous/anxious.     PHYSICAL EXAM: BP 131/86   Pulse 74   Temp 98 F (36.7 C) (Oral)   Resp 16   Wt 220 lb (99.8 kg)   SpO2 95%   BMI 36.61 kg/m   Physical Exam  General appearance - alert, well appearing, middle age AAF and in no distress Mental status - normal mood, behavior, speech, dress, motor activity, and thought processes Eyes - pupils equal and reactive,  extraocular eye movements intact Mouth - mucous membranes moist, pharynx normal without lesions Neck - supple, no significant adenopathy Chest - clear to auscultation, no wheezes, rales or rhonchi, symmetric air entry Heart - normal rate, regular rhythm, normal S1, S2, no murmurs, rubs, clicks or gallops Abdomen - +midline abdomen hernia, normal bowel sounds, sound, nontender, no organomegaly Extremities - peripheral pulses normal, no pedal edema, no clubbing or cyanosis  abd hernia  Results for orders placed or performed in visit on 04/09/18  POCT glucose (manual entry)  Result Value Ref Range   POC Glucose 137 (A) 70 - 99 mg/dl    ASSESSMENT AND PLAN:  1. Preoperative evaluation to rule out surgical contraindication Pt medically optimized for low risk bunion surgery She will hold off on taking her oral DM meds the morning of surgery.  She will take her blood pressure medications -if this will be done under general anesthesia, the anesthesiologist should be aware that pt has untreated OSA  2. OSA (obstructive sleep apnea) Message sent to care manager to see if any resources available to assist with helping her to get CPAP machine  3. Type 2 diabetes mellitus without complication, without long-term current use of insulin (HCC) Reported BS good. - POCT glucose (manual entry)  4. Essential hypertension Not at goal today but acceptable to move forward with surgery.  Continue current meds.   Patient was given the opportunity to ask questions.  Patient verbalized understanding of the plan and was able to repeat key elements of the plan.   Orders Placed This Encounter  Procedures  . POCT glucose (manual entry)     Requested Prescriptions    No prescriptions requested or ordered in this encounter    No follow-ups on file.  Karle Plumber, MD, FACP

## 2018-04-09 NOTE — Telephone Encounter (Signed)
Keep me posted!

## 2018-04-09 NOTE — Telephone Encounter (Addendum)
Pt called requesting her surgery clearance paperwork be sent back to [facility] as soon as possible because she states her surgery is currently scheduled for 04/10/2018. Please follow up, however she does understand that we just received the paperwork today and by protocol any paperwork takes 7-10 business days

## 2018-04-09 NOTE — Telephone Encounter (Signed)
"  I am at the doctor's office now.  Dr. Wynetta Emery said it would be later this afternoon before you get the letter."  Okay, that is fine.

## 2018-04-09 NOTE — Telephone Encounter (Signed)
Pt is scheduled to see Dr. Wynetta Emery today @250pm 

## 2018-04-10 ENCOUNTER — Encounter: Payer: Self-pay | Admitting: Podiatry

## 2018-04-10 DIAGNOSIS — M25571 Pain in right ankle and joints of right foot: Secondary | ICD-10-CM | POA: Diagnosis not present

## 2018-04-10 DIAGNOSIS — M2011 Hallux valgus (acquired), right foot: Secondary | ICD-10-CM | POA: Diagnosis not present

## 2018-04-10 DIAGNOSIS — E7800 Pure hypercholesterolemia, unspecified: Secondary | ICD-10-CM | POA: Diagnosis not present

## 2018-04-10 DIAGNOSIS — M21611 Bunion of right foot: Secondary | ICD-10-CM | POA: Diagnosis not present

## 2018-04-10 HISTORY — PX: FOOT SURGERY: SHX648

## 2018-04-13 ENCOUNTER — Telehealth: Payer: Self-pay | Admitting: Podiatry

## 2018-04-13 NOTE — Telephone Encounter (Signed)
Pt called and had surgery last week. The pain medication she was prescribed is giving her headaches. Patient wants to know if there is something else she can take. Please call patient.

## 2018-04-14 NOTE — Telephone Encounter (Signed)
I informed pt of Dr. Leeanne Rio 04/14/2018 7:06am orders.

## 2018-04-14 NOTE — Telephone Encounter (Signed)
I recommend taking 1/2 the Percocet or taking additional Tylenol for the headache Thanks Dr. Cannon Kettle

## 2018-04-16 ENCOUNTER — Ambulatory Visit (INDEPENDENT_AMBULATORY_CARE_PROVIDER_SITE_OTHER): Payer: Medicare Other

## 2018-04-16 ENCOUNTER — Ambulatory Visit (INDEPENDENT_AMBULATORY_CARE_PROVIDER_SITE_OTHER): Payer: Medicare Other | Admitting: Podiatry

## 2018-04-16 DIAGNOSIS — M2011 Hallux valgus (acquired), right foot: Secondary | ICD-10-CM | POA: Diagnosis not present

## 2018-04-16 NOTE — Progress Notes (Signed)
She presents today for her first postop visit date of surgery 04/10/2018 status post [facility] bunionectomy right foot.  She presents with her sister who states that she is been up on the foot a lot which is caused a lot of swelling and pain for her.  Sister/patient denies this.  Objective: Vital signs are stable alert and oriented x3.  Pulses are palpable.  Dry sterile dressing intact once removed demonstrates moderate edema some tenderness on range of motion of the first metatarsophalangeal joint.  Radiographs taken today demonstrate a Austin type osteotomy with double screw fixation which is in good position.  Assessment: Well-healing surgical foot moderate edema secondary to noncompliance.  Plan: Place Steri-Strips today placed her in a compression dressing and follow-up with her in 1 week consider suture removal at that time.  I did encourage range of motion exercises to help break down scar tissue

## 2018-04-23 ENCOUNTER — Ambulatory Visit (INDEPENDENT_AMBULATORY_CARE_PROVIDER_SITE_OTHER): Payer: Medicare Other | Admitting: Podiatry

## 2018-04-23 DIAGNOSIS — M2011 Hallux valgus (acquired), right foot: Secondary | ICD-10-CM | POA: Diagnosis not present

## 2018-04-26 NOTE — Progress Notes (Signed)
She presents today states my foot feels better than last week.  Date of surgery April 10, 2018 status post [facility] bunionectomy right foot.  Presents today with her sister who states that she may have been a little better but still not very good.  Objective: Vital signs are stable she is alert and oriented x3.  Dry sterile dressing is intact.  Once removed demonstrates no erythema there is mild edema no cellulitis drainage or odor she has good range of motion mild tenderness.  Assessment: Well-healing surgical foot.  Plan: Place her Darco shoe today with a compression anklet and will follow-up with her in 1 week

## 2018-05-05 ENCOUNTER — Ambulatory Visit (INDEPENDENT_AMBULATORY_CARE_PROVIDER_SITE_OTHER): Payer: Medicare Other

## 2018-05-05 ENCOUNTER — Ambulatory Visit (INDEPENDENT_AMBULATORY_CARE_PROVIDER_SITE_OTHER): Payer: Medicare Other | Admitting: Podiatry

## 2018-05-05 DIAGNOSIS — M2011 Hallux valgus (acquired), right foot: Secondary | ICD-10-CM

## 2018-05-05 DIAGNOSIS — M21619 Bunion of unspecified foot: Secondary | ICD-10-CM

## 2018-05-06 NOTE — Progress Notes (Signed)
She presents today for second postop visit date of surgery 04/10/2018 status post Sarah [name] bunionectomy right states my foot throbs at times but overall it feels better than it did.  Objective: Vital signs are stable she is alert and oriented x3.  Much decrease in edema from previous visit there is no open lesions or wounds that she has mild epidermis separation of the proximal incision site.  No purulence no malodor.  She has good range of motion though mildly tender first metatarsophalangeal joint.  Assessment: Well-healing surgical foot.  Plan: Follow-up with me in 2 weeks redressed today which is 1 make sure that that wound is closed before advancing her to tennis shoes.

## 2018-05-12 ENCOUNTER — Encounter: Payer: Medicare Other | Admitting: Podiatry

## 2018-05-14 ENCOUNTER — Encounter: Payer: Self-pay | Admitting: Podiatry

## 2018-05-14 ENCOUNTER — Ambulatory Visit (INDEPENDENT_AMBULATORY_CARE_PROVIDER_SITE_OTHER): Payer: Self-pay | Admitting: Podiatry

## 2018-05-14 DIAGNOSIS — Z9889 Other specified postprocedural states: Secondary | ICD-10-CM

## 2018-05-14 DIAGNOSIS — M2011 Hallux valgus (acquired), right foot: Secondary | ICD-10-CM

## 2018-05-14 DIAGNOSIS — T8130XA Disruption of wound, unspecified, initial encounter: Secondary | ICD-10-CM

## 2018-05-14 NOTE — Progress Notes (Signed)
She presents today for follow-up of her Austin bunionectomy with slight wound dehiscence.  Date of surgery April 10, 2018.  Objective: Vital signs are stable she is alert and oriented x3.  Pulses are palpable.  Much decrease in edema good range of motion of the first metatarsophalangeal joint.  After I cleaned up the area today it appears that there is only a very small superficial only epidermal dehiscence noted no drainage no purulence no malodor no deep opening to the dermis.  Assessment well-healing surgical foot with mild dehiscence.  Plan: I am going to allow her to place padding over this and get back into regular shoe gear should this conservative breakdown she is to notify us immediately otherwise I will follow-up with her in 2 to 4 weeks.

## 2018-05-21 ENCOUNTER — Ambulatory Visit (INDEPENDENT_AMBULATORY_CARE_PROVIDER_SITE_OTHER): Payer: Medicare Other

## 2018-05-21 ENCOUNTER — Ambulatory Visit (INDEPENDENT_AMBULATORY_CARE_PROVIDER_SITE_OTHER): Payer: Self-pay | Admitting: Podiatry

## 2018-05-21 DIAGNOSIS — M2011 Hallux valgus (acquired), right foot: Secondary | ICD-10-CM | POA: Diagnosis not present

## 2018-05-21 DIAGNOSIS — Z9889 Other specified postprocedural states: Secondary | ICD-10-CM

## 2018-05-21 NOTE — Progress Notes (Signed)
She presents today for a postop visit date of surgery 04/10/2018 status post [facility] bunionectomy right states left foot still sore occasionally but the wound appears to be healing up but she refers to the right first metatarsal area.  The incision is looking much better she says.  Objective: Vital signs are stable she is alert and oriented x3 and the foot is still mildly edematous but the incision sites: Healed uneventfully.  She has great range of motion on dorsiflexion and plantarflexion active and passively.  Assessment: Well-healing surgical foot.  Plan: Encourage range of motion exercises allow her to stay in her regular tennis shoe follow-up with me in 1 month.

## 2018-05-22 ENCOUNTER — Inpatient Hospital Stay (HOSPITAL_BASED_OUTPATIENT_CLINIC_OR_DEPARTMENT_OTHER): Payer: Medicare Other | Admitting: Gynecologic Oncology

## 2018-05-22 ENCOUNTER — Encounter: Payer: Self-pay | Admitting: Gynecologic Oncology

## 2018-05-22 ENCOUNTER — Ambulatory Visit: Payer: Medicare Other | Attending: Internal Medicine | Admitting: Internal Medicine

## 2018-05-22 ENCOUNTER — Inpatient Hospital Stay: Payer: Medicare Other | Attending: Gynecologic Oncology

## 2018-05-22 ENCOUNTER — Encounter: Payer: Self-pay | Admitting: Internal Medicine

## 2018-05-22 VITALS — BP 121/80 | HR 88 | Temp 97.7°F | Resp 20 | Ht 65.0 in | Wt 222.9 lb

## 2018-05-22 VITALS — BP 127/89 | HR 79 | Temp 97.9°F | Resp 16 | Wt 222.2 lb

## 2018-05-22 DIAGNOSIS — C569 Malignant neoplasm of unspecified ovary: Secondary | ICD-10-CM | POA: Diagnosis not present

## 2018-05-22 DIAGNOSIS — I10 Essential (primary) hypertension: Secondary | ICD-10-CM

## 2018-05-22 DIAGNOSIS — Z8249 Family history of ischemic heart disease and other diseases of the circulatory system: Secondary | ICD-10-CM | POA: Diagnosis not present

## 2018-05-22 DIAGNOSIS — E669 Obesity, unspecified: Secondary | ICD-10-CM

## 2018-05-22 DIAGNOSIS — E1165 Type 2 diabetes mellitus with hyperglycemia: Secondary | ICD-10-CM | POA: Diagnosis not present

## 2018-05-22 DIAGNOSIS — IMO0002 Reserved for concepts with insufficient information to code with codable children: Secondary | ICD-10-CM

## 2018-05-22 DIAGNOSIS — Z9221 Personal history of antineoplastic chemotherapy: Secondary | ICD-10-CM

## 2018-05-22 DIAGNOSIS — Z1239 Encounter for other screening for malignant neoplasm of breast: Secondary | ICD-10-CM | POA: Diagnosis not present

## 2018-05-22 DIAGNOSIS — C561 Malignant neoplasm of right ovary: Secondary | ICD-10-CM

## 2018-05-22 DIAGNOSIS — Z90710 Acquired absence of both cervix and uterus: Secondary | ICD-10-CM

## 2018-05-22 DIAGNOSIS — Z9889 Other specified postprocedural states: Secondary | ICD-10-CM | POA: Insufficient documentation

## 2018-05-22 DIAGNOSIS — Z833 Family history of diabetes mellitus: Secondary | ICD-10-CM | POA: Diagnosis not present

## 2018-05-22 DIAGNOSIS — E1142 Type 2 diabetes mellitus with diabetic polyneuropathy: Secondary | ICD-10-CM

## 2018-05-22 DIAGNOSIS — Z79899 Other long term (current) drug therapy: Secondary | ICD-10-CM | POA: Diagnosis not present

## 2018-05-22 DIAGNOSIS — Z90722 Acquired absence of ovaries, bilateral: Secondary | ICD-10-CM | POA: Insufficient documentation

## 2018-05-22 DIAGNOSIS — R05 Cough: Secondary | ICD-10-CM | POA: Diagnosis not present

## 2018-05-22 DIAGNOSIS — Z800 Family history of malignant neoplasm of digestive organs: Secondary | ICD-10-CM | POA: Diagnosis not present

## 2018-05-22 DIAGNOSIS — T464X5A Adverse effect of angiotensin-converting-enzyme inhibitors, initial encounter: Secondary | ICD-10-CM | POA: Insufficient documentation

## 2018-05-22 DIAGNOSIS — Z6835 Body mass index (BMI) 35.0-35.9, adult: Secondary | ICD-10-CM | POA: Diagnosis not present

## 2018-05-22 DIAGNOSIS — E785 Hyperlipidemia, unspecified: Secondary | ICD-10-CM

## 2018-05-22 DIAGNOSIS — R058 Other specified cough: Secondary | ICD-10-CM

## 2018-05-22 DIAGNOSIS — Z794 Long term (current) use of insulin: Secondary | ICD-10-CM | POA: Insufficient documentation

## 2018-05-22 DIAGNOSIS — Z6836 Body mass index (BMI) 36.0-36.9, adult: Secondary | ICD-10-CM | POA: Diagnosis not present

## 2018-05-22 LAB — POCT GLYCOSYLATED HEMOGLOBIN (HGB A1C): HbA1c, POC (controlled diabetic range): 7.6 % — AB (ref 0.0–7.0)

## 2018-05-22 LAB — GLUCOSE, POCT (MANUAL RESULT ENTRY): POC Glucose: 165 mg/dl — AB (ref 70–99)

## 2018-05-22 MED ORDER — AMLODIPINE BESYLATE 10 MG PO TABS: 10.0000 mg | ORAL_TABLET | Freq: Every day | ORAL | 3 refills | Status: DC

## 2018-05-22 MED ORDER — METOPROLOL TARTRATE 25 MG PO TABS: ORAL_TABLET | ORAL | 3 refills | Status: DC

## 2018-05-22 MED ORDER — LOSARTAN POTASSIUM-HCTZ 50-12.5 MG PO TABS: 0.5000 | ORAL_TABLET | Freq: Every day | ORAL | 3 refills | Status: DC

## 2018-05-22 MED ORDER — ROSUVASTATIN CALCIUM 10 MG PO TABS: 10.0000 mg | ORAL_TABLET | Freq: Every day | ORAL | 6 refills | Status: DC

## 2018-05-22 MED ORDER — HYDROCHLOROTHIAZIDE 25 MG PO TABS: 25.0000 mg | ORAL_TABLET | Freq: Every day | ORAL | 3 refills | Status: DC

## 2018-05-22 MED ORDER — HYDRALAZINE HCL 25 MG PO TABS: 25.0000 mg | ORAL_TABLET | Freq: Three times a day (TID) | ORAL | 1 refills | Status: DC

## 2018-05-22 MED ORDER — METFORMIN HCL 1000 MG PO TABS: 1000.0000 mg | ORAL_TABLET | Freq: Two times a day (BID) | ORAL | 3 refills | Status: DC

## 2018-05-22 NOTE — Progress Notes (Signed)
Patient ID: Sarah [name], female    DOB: Jul 11, 1965  MRN: [id number]  CC: Hypertension and Diabetes   Subjective: Sarah [name] is a 52 y.o. female who presents for chronic ds management. Her concerns today include:  DM with peripheral neuropathy, HTN, HL, OSA, midline LBP,stage IIIc low-grade serous carcinoma of the ovaryS/p BSO, omentectomy, appendectomy on 07/28/14. S/p adjuvant chemotherapy with 6 cycles of paclitaxel and carboplatin completed on 12/08/14. S/p an ex lap, hernia repair and small bowel resection for a presumed recurrence (benign pathology)  Bunion surgery on RT foot went well.  Saw her podiatrist yesterday in follow-up.  DM:  Checking BS TID.  Dose not have log with her.  Gives range of 130 or lower before breakfast, 2 hr after lunch in the 180s and bedtime range in 120-130s.  Low BS in 60s one night.  Can tell when blood sugar is low. Compliant with meds that include metformin and Amaryl. Feels she is doing better with eating habits. Eating less.   Last eye exam was 6 mths ago - Dr. Sherral Hammers with [facility]. Next appt 06/2018 No set exercise activity but tries to park further away from buildings when she is out running errands.  Plans to start walking at mall 3 days a wk with her sister once sister has recovered from knee surgery Doing okay on Gabapentin for her neuropathy symptoms  HTN: Reports compliance with medications.  She limits salt in the foods.  Denies any chest pains or shortness of breath.  No lower extremity edema. Chronic dry cough since being on Lisinopril/HCTZ  Patient Active Problem List   Diagnosis Date Noted  . Hiatal hernia with GERD without esophagitis 06/18/2017  . Need for influenza vaccination 02/17/2017  . Ventral hernia 01/24/2016  . Type 2 diabetes mellitus without complication, without long-term current use of insulin ([place]) 10/04/2015  . Obesity (BMI 30-39.9) 10/04/2015  . Dyslipidemia 07/27/2015  . Neuropathic pain of both legs  07/27/2015  . Obesity (BMI 30.0-34.9) 12/24/2014  . Genetic testing 11/01/2014  . Constipation - functional 09/24/2014  . Chemotherapy-induced peripheral neuropathy ([place]) 09/24/2014  . Midline low back pain without sciatica 09/24/2014  . Family history of colon cancer   . Primary peritoneal carcinomatosis ([place]) 08/21/2014  . Ovarian cancer, right ([place]) 07/08/2014  . Hilar adenopathy   . Essential hypertension   . Pulmonary nodule      Current Outpatient Medications on File Prior to Visit  Medication Sig Dispense Refill  . ACCU-CHEK SMARTVIEW test strip TID and a bedtime. E11.9 100 each 12  . amLODipine (NORVASC) 10 MG tablet Take 1 tablet (10 mg total) by mouth daily. 90 tablet 3  . blood glucose meter kit and supplies Dispense based on patient and insurance preference. Use up to four times daily as directed. (FOR ICD-9 250.00, 250.01). (Patient not taking: Reported on 04/09/2018) 1 each 0  . gabapentin (NEURONTIN) 300 MG capsule 1 capsule p.o. every morning and q. noon and 2 tabs p.o. at bedtime 120 capsule 11  . glimepiride (AMARYL) 2 MG tablet Take 1 tablet (2 mg total) by mouth daily with breakfast. 30 tablet 6  . hydrALAZINE (APRESOLINE) 25 MG tablet Take 1 tablet (25 mg total) by mouth 3 (three) times daily. 360 tablet 1  . hydrochlorothiazide (HYDRODIURIL) 25 MG tablet Take 25 mg by mouth daily.  3  . Insulin Pen Needle 31G X 5 MM MISC For use with lantus pen for injections once daily (Patient not taking: Reported  on 04/09/2018) 50 each 10  . Lancets (ACCU-CHEK MULTICLIX) lancets   0  . latanoprost (XALATAN) 0.005 % ophthalmic solution Place 1 drop into both eyes at bedtime.    Marland Kitchen lisinopril-hydrochlorothiazide (PRINZIDE,ZESTORETIC) 10-12.5 MG tablet Take 1 tablet by mouth daily. 90 tablet 3  . metFORMIN (GLUCOPHAGE) 1000 MG tablet Take 1 tablet (1,000 mg total) by mouth 2 (two) times daily with a meal. 180 tablet 3  . metoprolol tartrate (LOPRESSOR) 25 MG tablet TAKE 1/2 TABLET BY  MOUTH 2 TIMES A DAY 90 tablet 3  . rosuvastatin (CRESTOR) 10 MG tablet Take 1 tablet (10 mg total) by mouth daily. Stop Atorvastatin 30 tablet 6  . timolol (TIMOPTIC) 0.5 % ophthalmic solution Place 1 drop into both eyes daily.     No current facility-administered medications on file prior to visit.     Allergies  Allergen Reactions  . Emend [Aprepitant] Other (See Comments)    Urticaria     Social History   Socioeconomic History  . Marital status: Married    Spouse name: Not on file  . Number of children: Not on file  . Years of education: Not on file  . Highest education level: Not on file  Occupational History  . Not on file  Social Needs  . Financial resource strain: Not on file  . Food insecurity:    Worry: Not on file    Inability: Not on file  . Transportation needs:    Medical: Not on file    Non-medical: Not on file  Tobacco Use  . Smoking status: Never Smoker  . Smokeless tobacco: Never Used  Substance and Sexual Activity  . Alcohol use: No  . Drug use: No  . Sexual activity: Not on file  Lifestyle  . Physical activity:    Days per week: Not on file    Minutes per session: Not on file  . Stress: Not on file  Relationships  . Social connections:    Talks on phone: Not on file    Gets together: Not on file    Attends religious service: Not on file    Active member of club or organization: Not on file    Attends meetings of clubs or organizations: Not on file    Relationship status: Not on file  . Intimate partner violence:    Fear of current or ex partner: Not on file    Emotionally abused: Not on file    Physically abused: Not on file    Forced sexual activity: Not on file  Other Topics Concern  . Not on file  Social History Narrative  . Not on file    Family History  Problem Relation Age of Onset  . Hypertension Mother   . Hypertension Father   . Diabetes Father   . Cancer Sister 50       fibrosarcoma (back); currently 50  . Prostate  cancer Maternal Uncle   . Colon cancer Paternal Aunt        Dx 13s; deceased 69s  . Prostate cancer Paternal Uncle        currently 3  . Cancer Paternal Uncle 66       "bone" ; unk. primary  . Stomach cancer Paternal Uncle     Past Surgical History:  Procedure Laterality Date  . ABDOMINAL HYSTERECTOMY    . INCISIONAL HERNIA REPAIR N/A 07/06/2015   Procedure: INCISIONAL HERNIA REPAIR ;  Surgeon: Rolm Bookbinder, MD;  Location: [facility];  Service:  General;  Laterality: N/A;  . INSERTION OF MESH N/A 07/06/2015   Procedure: WITH INSERTION OF PHASIX ST MESH;  Surgeon: Rolm Bookbinder, MD;  Location: [facility];  Service: General;  Laterality: N/A;  . LAPAROTOMY N/A 07/06/2015   Procedure: EXPLORATORY LAPAROTOMY;  Surgeon: Everitt Amber, MD;  Location: [facility];  Service: Gynecology;  Laterality: N/A;  . LYSIS OF ADHESION N/A 07/06/2015   Procedure:  LYSIS OF ADHESION RESECTION OF MESENTERIC MASS BOWEL RESECTION ;  Surgeon: Everitt Amber, MD;  Location: [facility];  Service: Gynecology;  Laterality: N/A;  . ROBOTIC ASSISTED TOTAL HYSTERECTOMY WITH BILATERAL SALPINGO OOPHERECTOMY Bilateral 07/28/2014   Procedure: ROBOTIC ASSISTED lysis of adhesions with biopsies, converted to LAPAROTOMY, bilateral salpingoorphorectomy, omentectomy,appendectomy;  Surgeon: Janie Morning, MD;  Location: [facility];  Service: Gynecology;  Laterality: Bilateral;  . THYROIDECTOMY, PARTIAL      ROS: Review of Systems Negative except as above PHYSICAL EXAM: BP 127/89   Pulse 79   Temp 97.9 F (36.6 C) (Oral)   Resp 16   Wt 222 lb 3.2 oz (100.8 kg)   SpO2 100%   BMI 36.98 kg/m   Wt Readings from Last 3 Encounters:  05/22/18 222 lb 3.2 oz (100.8 kg)  04/09/18 220 lb (99.8 kg)  02/20/18 225 lb 3.2 oz (102.2 kg)   Physical Exam  General appearance - alert, well appearing, and in no distress Mental status - normal mood, behavior, speech, dress, motor activity, and thought processes Neck - supple, no significant  adenopathy Chest - clear to auscultation, no wheezes, rales or rhonchi, symmetric air entry Heart - normal rate, regular rhythm, normal S1, S2, no murmurs, rubs, clicks or gallops Extremities -no lower extremity edema. Diabetic Foot Exam - Simple   Simple Foot Form Visual Inspection See comments:  Yes Sensation Testing Intact to touch and monofilament testing bilaterally:  Yes Pulse Check Posterior Tibialis and Dorsalis pulse intact bilaterally:  Yes Comments Healed surgical scar over the right big toe     Results for orders placed or performed in visit on 05/22/18  POCT glucose (manual entry)  Result Value Ref Range   POC Glucose 165 (A) 70 - 99 mg/dl  POCT glycosylated hemoglobin (Hb A1C)  Result Value Ref Range   Hemoglobin A1C     HbA1c POC (<> result, manual entry)     HbA1c, POC (prediabetic range)     HbA1c, POC (controlled diabetic range) 7.6 (A) 0.0 - 7.0 %    ASSESSMENT AND PLAN:  1. Uncontrolled type 2 diabetes mellitus with peripheral neuropathy (HCC) A1c not at goal but reported blood sugars are not bad.  We agreed not to change the dose of her medications at this time.  She will work on increasing her activity level.  I encouraged her to continue trying to eat healthy. - POCT glucose (manual entry) - POCT glycosylated hemoglobin (Hb A1C) - metFORMIN (GLUCOPHAGE) 1000 MG tablet; Take 1 tablet (1,000 mg total) by mouth 2 (two) times daily with a meal.  Dispense: 180 tablet; Refill: 3  2. Essential hypertension Stop lisinopril-HCTZ due to cough.  Change to Cozaar-HCTZ instead. - metoprolol tartrate (LOPRESSOR) 25 MG tablet; TAKE 1/2 TABLET BY MOUTH 2 TIMES A DAY  Dispense: 90 tablet; Refill: 3 - amLODipine (NORVASC) 10 MG tablet; Take 1 tablet (10 mg total) by mouth daily.  Dispense: 90 tablet; Refill: 3 - hydrALAZINE (APRESOLINE) 25 MG tablet; Take 1 tablet (25 mg total) by mouth 3 (three) times daily.  Dispense: 360 tablet; Refill:  1 - hydrochlorothiazide  (HYDRODIURIL) 25 MG tablet; Take 1 tablet (25 mg total) by mouth daily.  Dispense: 90 tablet; Refill: 3 - losartan-hydrochlorothiazide (HYZAAR) 50-12.5 MG tablet; Take 0.5 tablets by mouth daily.  Dispense: 45 tablet; Refill: 3  3. Obesity (BMI 35.0-39.9 without comorbidity) See #1 above.  4. Dyslipidemia - rosuvastatin (CRESTOR) 10 MG tablet; Take 1 tablet (10 mg total) by mouth daily. Stop Atorvastatin  Dispense: 30 tablet; Refill: 6  5. Breast cancer screening - MM Digital Screening; Future  6. Cough due to ACE inhibitor See #2 above    Patient was given the opportunity to ask questions.  Patient verbalized understanding of the plan and was able to repeat key elements of the plan.   Orders Placed This Encounter  Procedures  . POCT glucose (manual entry)  . POCT glycosylated hemoglobin (Hb A1C)     Requested Prescriptions    No prescriptions requested or ordered in this encounter    No follow-ups on file.  Karle Plumber, MD, FACP

## 2018-05-22 NOTE — Patient Instructions (Signed)
We will contact you with the results of your CA 125 from today.  Plan to follow up in six months with a CA 125 at that time or sooner if needed.  If Dr. Donne Hazel decides to proceed with surgery sooner than you are seen in June, please call our office so we can arrange for you to have a CT scan of the abdomen and pelvis prior to surgery.  Please call our office for any new concerns, questions, or symptoms such as abdominal swelling, pain that persists.

## 2018-05-22 NOTE — Patient Instructions (Signed)
Stop lisinopril/HCTZ.  Start your Losartan/HCTZ instead.   Diabetes Mellitus and Exercise Exercising regularly is important for your overall health, especially when you have diabetes (diabetes mellitus). Exercising is not only about losing weight. It has many health benefits, such as increasing muscle strength and bone density and reducing body fat and stress. This leads to improved fitness, flexibility, and endurance, all of which result in better overall health. Exercise has additional benefits for people with diabetes, including:  Reducing appetite.  Helping to lower and control blood glucose.  Lowering blood pressure.  Helping to control amounts of fatty substances (lipids) in the blood, such as cholesterol and triglycerides.  Helping the body to respond better to insulin (improving insulin sensitivity).  Reducing how much insulin the body needs.  Decreasing the risk for heart disease by: ? Lowering cholesterol and triglyceride levels. ? Increasing the levels of good cholesterol. ? Lowering blood glucose levels.  What is my activity plan? Your health care provider or certified diabetes educator can help you make a plan for the type and frequency of exercise (activity plan) that works for you. Make sure that you:  Do at least 150 minutes of moderate-intensity or vigorous-intensity exercise each week. This could be brisk walking, biking, or water aerobics. ? Do stretching and strength exercises, such as yoga or weightlifting, at least 2 times a week. ? Spread out your activity over at least 3 days of the week.  Get some form of physical activity every day. ? Do not go more than 2 days in a row without some kind of physical activity. ? Avoid being inactive for more than 90 minutes at a time. Take frequent breaks to walk or stretch.  Choose a type of exercise or activity that you enjoy, and set realistic goals.  Start slowly, and gradually increase the intensity of your exercise  over time.  What do I need to know about managing my diabetes?  Check your blood glucose before and after exercising. ? If your blood glucose is higher than 240 mg/dL (13.3 mmol/L) before you exercise, check your urine for ketones. If you have ketones in your urine, do not exercise until your blood glucose returns to normal.  Know the symptoms of low blood glucose (hypoglycemia) and how to treat it. Your risk for hypoglycemia increases during and after exercise. Common symptoms of hypoglycemia can include: ? Hunger. ? Anxiety. ? Sweating and feeling clammy. ? Confusion. ? Dizziness or feeling light-headed. ? Increased heart rate or palpitations. ? Blurry vision. ? Tingling or numbness around the mouth, lips, or tongue. ? Tremors or shakes. ? Irritability.  Keep a rapid-acting carbohydrate snack available before, during, and after exercise to help prevent or treat hypoglycemia.  Avoid injecting insulin into areas of the body that are going to be exercised. For example, avoid injecting insulin into: ? The arms, when playing tennis. ? The legs, when jogging.  Keep records of your exercise habits. Doing this can help you and your health care provider adjust your diabetes management plan as needed. Write down: ? Food that you eat before and after you exercise. ? Blood glucose levels before and after you exercise. ? The type and amount of exercise you have done. ? When your insulin is expected to peak, if you use insulin. Avoid exercising at times when your insulin is peaking.  When you start a new exercise or activity, work with your health care provider to make sure the activity is safe for you, and to adjust  your insulin, medicines, or food intake as needed.  Drink plenty of water while you exercise to prevent dehydration or heat stroke. Drink enough fluid to keep your urine clear or pale yellow. This information is not intended to replace advice given to you by your health care  provider. Make sure you discuss any questions you have with your health care provider. Document Released: 08/17/2003 Document Revised: 12/15/2015 Document Reviewed: 11/06/2015 Elsevier Interactive Patient Education  2018 [employer].

## 2018-05-22 NOTE — Progress Notes (Signed)
POST-TREATMENT FOLLOWUP. TREATMENT COUNSELING.  Assessment:   52 y.o. 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   S/p BSO, omentectomy, appendectomy on 07/28/14. S/p adjuvant chemotherapy with 6 cycles of paclitaxel and carboplatin. NED since 12/08/14. S/p an ex lap, hernia repair and small bowel resection for a presumed recurrence (benign pathology). Recurrent ventral hernia   No evidence of recurrent disease on exam,  recommend check CA 125 today.   Plan: 1) Recurrent ventral hernia: She will see [name] in January. If he plans on surgery we will order a CT scan prior to her surgery to ensure there is no recurrence that needs addressing at the time of surgery (though probability for this is unlikely given her enduring complete clinical response to date). 2) Treatment counseling -  I recommend continuing routine surveillance with visit in 6 months with CA 125 assessments (CT scans on a prn basis for symptoms or rise in CA 125). I will see her back in June, 2020.   HPI:  Sarah [name] is a 52 y.o. year old initially seen in consultation on 07/08/14 for bilateral ovarian masses and ascites. This was diagnosed at the time of admission to the hospital for hypertensive crisis. This diagnosis of hypertension was new and she was newly started on a medical regimen for this. She then underwent an exploratory laparotomy, BSO, omentectomy, appendectomy on 07/28/14 with Dr Skeet Latch without complications.  Her postoperative course was uncomplicated.  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 This represented a stage IIIc disease process.  She received 6 cycles of adjuvant chemotherapy with paclitaxel and carboplatin with Dr Marko Plume on 08/25/14 to 12/08/14.  Pretreatment CA 125 was 58 on 06/30/14, and was 20 on 12/22/14 (it's nadir had been 16 on 4/14/ 16).  Post treatment CT  of the chest, abdomen and pelvis on 01/12/15 revealed: Previously noted paratracheal, subcarinal and prevascular mediastinal lymphadenopathy has resolved, with no residual pathologically enlarged mediastinal lymph nodes. Resolved hilar lymphadenopathy, with no residual pathologically enlarged mediastinal nodes. The previously described 3 mm subpleural left lower lobe pulmonary nodule is decreased to 2 mm (series 4/image 41). A separate 2 mm subpleural left lower lobe pulmonary nodule (4/29) is slightly decreased from 47m on 06/28/2014. A subpleural 3 mm right upper lobe pulmonary nodule (4/15) is unchanged from 06/28/2014. A 2 mm subpleural pulmonary nodule in the superior segment right lower lobe associated with the right major fissure (4/21) appears new. A 0.7 cm right posterior mesenteric node measures 0.7 cm short axis (series 2/image 93), decreased from 1.2 cm. No definite peritoneal tumor implants detected. A 0.4 cm nodule anterior/inferior to the spleen (series 2/ image 60) is mildly decreased from 0.7 cm. Small umbilical hernia containing a small bowel loop, with no evidence of small-bowel obstruction or strangulation.  She had a CA 125 on 03/27/15 that was normal and stable at 14.  A CT of the chest, abdomen and pelvis was performed on 04/27/15 which showed: No pulmonary mass, infiltrate, or effusion. Tiny less than 5 mm subpleural pulmonary nodules in the right upper lobe on image remain stable. No new or enlarging pulmonary nodules identified. Small left paraventral hernia containing bowel loops. New soft tissue nodularity is seen within the anterior abdominal mesenteric fat, with largest nodule measuring 10 x 17 mm. No other sites of peritoneal nodularity or soft tissue density seen.   On 07/06/15 she was taken to  the OR for an ex lap, lysis of adhesions, and small bowel resection with ventral hernia repair. FIndings were remarkable for substantial small bowel adhesions, hwoever the mass that had  been seen on imaging was not appreciated intraperitoneally. There was white nodularity and stranding on the mesentery of a segment of small bowel. It was completely resected with the small bowel resection. Pathology was benign.  Postoperatively she was noted to have very high blood glucose on acuchecks. An HbA1C was drawn on 07/07/15 and was very elevated at 10.1%. She was started on Insulin.  Her last CA 125 was 15 on 01/22/16 and 20.2 on 04/22/16 and 16.7 on 07/22/16 and 16.5 on 11/20/16.  Interval Hx:  CT abdo/pelvis for abdominal pains on 11/25/16 showed a large ventral hernia with unobstructed small bowel but no recurrence or metastases.  The patient had bunion surgery. She is seeing [name] for her hernia.   Current Outpatient Medications on File Prior to Visit  Medication Sig Dispense Refill  . ACCU-CHEK SMARTVIEW test strip TID and a bedtime. E11.9 100 each 12  . blood glucose meter kit and supplies Dispense based on patient and insurance preference. Use up to four times daily as directed. (FOR ICD-9 250.00, 250.01). 1 each 0  . gabapentin (NEURONTIN) 300 MG capsule 1 capsule p.o. every morning and q. noon and 2 tabs p.o. at bedtime 120 capsule 11  . glimepiride (AMARYL) 2 MG tablet Take 1 tablet (2 mg total) by mouth daily with breakfast. 30 tablet 6  . Insulin Pen Needle 31G X 5 MM MISC For use with lantus pen for injections once daily 50 each 10  . Lancets (ACCU-CHEK MULTICLIX) lancets   0  . latanoprost (XALATAN) 0.005 % ophthalmic solution Place 1 drop into both eyes at bedtime.    . timolol (TIMOPTIC) 0.5 % ophthalmic solution Place 1 drop into both eyes daily.     No current facility-administered medications on file prior to visit.    Allergies  Allergen Reactions  . Emend [Aprepitant] Other (See Comments)    Urticaria   . Lisinopril Cough   Past Medical History:  Diagnosis Date  . Arthritis   . Family history of colon cancer   . Glaucoma 02/16  . Glucagonoma  07/28/14   Pt denies this but states she has glaucoma  . History of chemotherapy   . Hypertension   . Imbalance   . Neuropathy    feet bilat  . Nocturia   . Peritoneal carcinomatosis ([place])    carcinoma of ovary   . Shortness of breath dyspnea    hx of 2013 - no problems currently   . Thyroid nodule    history of   . Wears glasses    Past Surgical History:  Procedure Laterality Date  . ABDOMINAL HYSTERECTOMY    . FOOT SURGERY  04/2018   [facility]  . INCISIONAL HERNIA REPAIR N/A 07/06/2015   Procedure: INCISIONAL HERNIA REPAIR ;  Surgeon: Rolm Bookbinder, MD;  Location: [facility];  Service: General;  Laterality: N/A;  . INSERTION OF MESH N/A 07/06/2015   Procedure: WITH INSERTION OF PHASIX ST MESH;  Surgeon: Rolm Bookbinder, MD;  Location: [facility];  Service: General;  Laterality: N/A;  . LAPAROTOMY N/A 07/06/2015   Procedure: EXPLORATORY LAPAROTOMY;  Surgeon: Everitt Amber, MD;  Location: [facility];  Service: Gynecology;  Laterality: N/A;  . LYSIS OF ADHESION N/A 07/06/2015   Procedure:  LYSIS OF ADHESION RESECTION OF MESENTERIC MASS BOWEL RESECTION ;  Surgeon: Everitt Amber, MD;  Location: [facility];  Service: Gynecology;  Laterality: N/A;  . ROBOTIC ASSISTED TOTAL HYSTERECTOMY WITH BILATERAL SALPINGO OOPHERECTOMY Bilateral 07/28/2014   Procedure: ROBOTIC ASSISTED lysis of adhesions with biopsies, converted to LAPAROTOMY, bilateral salpingoorphorectomy, omentectomy,appendectomy;  Surgeon: Janie Morning, MD;  Location: [facility];  Service: Gynecology;  Laterality: Bilateral;  . THYROIDECTOMY, PARTIAL     Family History  Problem Relation Age of Onset  . Hypertension Mother   . Hypertension Father   . Diabetes Father   . Cancer Sister 50       fibrosarcoma (back); currently 68  . Prostate cancer Maternal Uncle   . Colon cancer Paternal Aunt        Dx 37s; deceased 80s  . Prostate cancer Paternal Uncle        currently 7  . Cancer Paternal Uncle 45       "bone" ; unk. primary  . Stomach  cancer Paternal Uncle    Social History   Socioeconomic History  . Marital status: Married    Spouse name: Not on file  . Number of children: Not on file  . Years of education: Not on file  . Highest education level: Not on file  Occupational History  . Not on file  Social Needs  . Financial resource strain: Not on file  . Food insecurity:    Worry: Not on file    Inability: Not on file  . Transportation needs:    Medical: Not on file    Non-medical: Not on file  Tobacco Use  . Smoking status: Never Smoker  . Smokeless tobacco: Never Used  Substance and Sexual Activity  . Alcohol use: No  . Drug use: No  . Sexual activity: Not on file  Lifestyle  . Physical activity:    Days per week: Not on file    Minutes per session: Not on file  . Stress: Not on file  Relationships  . Social connections:    Talks on phone: Not on file    Gets together: Not on file    Attends religious service: Not on file    Active member of club or organization: Not on file    Attends meetings of clubs or organizations: Not on file    Relationship status: Not on file  . Intimate partner violence:    Fear of current or ex partner: Not on file    Emotionally abused: Not on file    Physically abused: Not on file    Forced sexual activity: Not on file  Other Topics Concern  . Not on file  Social History Narrative  . Not on file    Review of systems: Constitutional:  She has no weight gain or weight loss. She has no fever or chills. Eyes: No blurred vision Ears, Nose, Mouth, Throat: No dizziness, headaches or changes in hearing. No mouth sores. Cardiovascular: No chest pain, palpitations or edema. Respiratory:  No shortness of breath, wheezing or cough Gastrointestinal: She has normal bowel movements without diarrhea or constipation. She denies any nausea or vomiting. She denies blood in her stool or heart burn. Abdominal pain Genitourinary:  She denies pelvic pain, pelvic pressure or changes  in her urinary function. She has no hematuria, dysuria, or incontinence. She has no irregular vaginal bleeding or vaginal discharge Musculoskeletal: Denies muscle weakness or joint pains.  Skin:  She has no skin changes, rashes or itching Neurological:  Denies dizziness or headaches. + bilateral foot neuropathy Psychiatric:  She denies depression or anxiety. Hematologic/Lymphatic:   No easy bruising or bleeding   Physical Exam: Blood pressure 121/80, pulse 88, temperature 97.7 F (36.5 C), temperature source Oral, resp. rate 20, height 5' 5"  (1.651 m), weight 222 lb 14.4 oz (101.1 kg), SpO2 100 %. General: Well dressed, well nourished in no apparent distress.   HEENT:  Normocephalic and atraumatic, no lesions.  Extraocular muscles intact. Sclerae anicteric. Pupils equal, round, reactive. No mouth sores or ulcers. Thyroid is normal size, not nodular, midline. Abdomen:  Soft, nontender, nondistended.  No palpable masses.  No hepatosplenomegaly.  No ascites. Normal bowel sounds. Incision is healed. 15cm soft, reducible midline upper abdominal ventral hernia. No erythema. Not tender to palpate. Genitourinary: No pelvic masses. Extremities: No cyanosis, clubbing or edema.  No calf tenderness or erythema. No palpable cords. Psychiatric: Mood and affect are appropriate. Neurological: Awake, alert and oriented x 3. Sensation is intact, no neuropathy.  Musculoskeletal: No pain, normal strength and range of motion.  Thereasa Solo, MD

## 2018-05-23 LAB — CA 125: Cancer Antigen (CA) 125: 16.1 U/mL (ref 0.0–38.1)

## 2018-06-08 ENCOUNTER — Telehealth: Payer: Self-pay | Admitting: Internal Medicine

## 2018-06-08 NOTE — Telephone Encounter (Signed)
1) Medication(s) Requested (by name): Losartan hydrochlorothiazide  2) Pharmacy of Choice: [facility] on [address]  Patient called because she was told by the pharmacy that her prescription medication was on back order and they were not sure when they would get it. Patient says that they could give her the medication in two different pills versus one pill.    Please follow up.

## 2018-06-18 ENCOUNTER — Ambulatory Visit (INDEPENDENT_AMBULATORY_CARE_PROVIDER_SITE_OTHER): Payer: Medicare Other

## 2018-06-18 ENCOUNTER — Ambulatory Visit (INDEPENDENT_AMBULATORY_CARE_PROVIDER_SITE_OTHER): Payer: Medicare Other | Admitting: Podiatry

## 2018-06-18 ENCOUNTER — Encounter: Payer: Self-pay | Admitting: Podiatry

## 2018-06-18 DIAGNOSIS — M2011 Hallux valgus (acquired), right foot: Secondary | ICD-10-CM | POA: Diagnosis not present

## 2018-06-18 DIAGNOSIS — Z9889 Other specified postprocedural states: Secondary | ICD-10-CM

## 2018-06-18 NOTE — Progress Notes (Signed)
She presents today for postop visit date of surgery 04/10/2018 Sarah [name] bunionectomy right foot she states that has some pain but is doing much better.  Objective: Vital signs are stable she is alert and oriented x3 there is no longer any edema no erythema cellulitis drainage or odor she has great range of motion of the first metatarsophalangeal joint.  Radiographs taken today demonstrate a well-healing osteotomy with internal fixation in good position.  Assessment: Well-healing surgical foot.  Plan: We will allow her to get back to her regular routine I will follow-up with her in 1 more month.

## 2018-06-23 DIAGNOSIS — H2513 Age-related nuclear cataract, bilateral: Secondary | ICD-10-CM | POA: Diagnosis not present

## 2018-06-23 DIAGNOSIS — H401132 Primary open-angle glaucoma, bilateral, moderate stage: Secondary | ICD-10-CM | POA: Diagnosis not present

## 2018-06-23 DIAGNOSIS — H53413 Scotoma involving central area, bilateral: Secondary | ICD-10-CM | POA: Diagnosis not present

## 2018-06-23 DIAGNOSIS — H35369 Drusen (degenerative) of macula, unspecified eye: Secondary | ICD-10-CM | POA: Diagnosis not present

## 2018-07-02 ENCOUNTER — Ambulatory Visit
Admission: RE | Admit: 2018-07-02 | Discharge: 2018-07-02 | Disposition: A | Discharge status: Home / Self Care | Payer: Medicare Other | Source: Ambulatory Visit | Attending: Internal Medicine | Admitting: Internal Medicine

## 2018-07-02 DIAGNOSIS — Z1239 Encounter for other screening for malignant neoplasm of breast: Secondary | ICD-10-CM

## 2018-07-02 DIAGNOSIS — Z1231 Encounter for screening mammogram for malignant neoplasm of breast: Secondary | ICD-10-CM | POA: Diagnosis not present

## 2018-07-03 ENCOUNTER — Other Ambulatory Visit: Payer: Self-pay | Admitting: Internal Medicine

## 2018-07-03 DIAGNOSIS — R928 Other abnormal and inconclusive findings on diagnostic imaging of breast: Secondary | ICD-10-CM

## 2018-07-09 ENCOUNTER — Ambulatory Visit
Admission: RE | Admit: 2018-07-09 | Discharge: 2018-07-09 | Disposition: A | Discharge status: Home / Self Care | Payer: Medicare Other | Source: Ambulatory Visit | Attending: Internal Medicine | Admitting: Internal Medicine

## 2018-07-09 ENCOUNTER — Other Ambulatory Visit: Payer: Self-pay | Admitting: Internal Medicine

## 2018-07-09 DIAGNOSIS — R928 Other abnormal and inconclusive findings on diagnostic imaging of breast: Secondary | ICD-10-CM | POA: Diagnosis not present

## 2018-07-09 DIAGNOSIS — N63 Unspecified lump in unspecified breast: Secondary | ICD-10-CM

## 2018-07-09 DIAGNOSIS — N6312 Unspecified lump in the right breast, upper inner quadrant: Secondary | ICD-10-CM | POA: Diagnosis not present

## 2018-07-09 DIAGNOSIS — N6310 Unspecified lump in the right breast, unspecified quadrant: Secondary | ICD-10-CM

## 2018-07-16 ENCOUNTER — Ambulatory Visit (INDEPENDENT_AMBULATORY_CARE_PROVIDER_SITE_OTHER): Payer: Medicare Other | Admitting: Podiatry

## 2018-07-16 ENCOUNTER — Telehealth: Payer: Self-pay | Admitting: Internal Medicine

## 2018-07-16 ENCOUNTER — Ambulatory Visit (INDEPENDENT_AMBULATORY_CARE_PROVIDER_SITE_OTHER): Payer: Medicare Other

## 2018-07-16 DIAGNOSIS — M2011 Hallux valgus (acquired), right foot: Secondary | ICD-10-CM

## 2018-07-16 DIAGNOSIS — Z9889 Other specified postprocedural states: Secondary | ICD-10-CM

## 2018-07-16 NOTE — Telephone Encounter (Signed)
Pt reports flu like symptoms, she denied a sameday office visit with PCP and just wants a nurse call her for an ok to take over the counter medications to treat the symptoms , please follow up

## 2018-07-16 NOTE — Progress Notes (Signed)
She presents today date of surgery 04/10/2018 status post bunionectomy right.  She denies fever chills nausea vomiting states that is doing just great.  Objective: Vital signs are stable she is alert oriented x3 has great range of motion of the first metatarsophalangeal joint of the right foot.  Radiographs taken today demonstrate a capital osteotomy which is gone on to heal uneventfully with internal fixation in good position.  Assessment: Well-healing surgical foot right.  Plan: Allow her to get back to her regular routine follow-up with me on an as-needed basis.

## 2018-07-16 NOTE — Telephone Encounter (Signed)
Pt reports dry cough that hurts, nonproductive, chest and throat hurt. No nasal drainage. No fever. Ache and little weak all over but not bad per pt.  Pt states want something to "break up cough or get rid of it."  Pt ph [phone].  Uses [facility] [address].

## 2018-07-16 NOTE — Telephone Encounter (Signed)
Wile forward to pcp

## 2018-07-16 NOTE — Telephone Encounter (Signed)
Returned pt call to get some more information. Pt didn't answer lvm asking pt to give me a call back at her earliest convenience   If pt calls back please get more information on her symptoms to determined if she will need a office visit or not.

## 2018-07-17 NOTE — Telephone Encounter (Signed)
Contacted pt and left a detailed vm informing pt of Dr. Johnson message and if she has any questions or concerns to give me a call  

## 2018-07-20 ENCOUNTER — Telehealth: Payer: Self-pay | Admitting: Internal Medicine

## 2018-07-20 DIAGNOSIS — I10 Essential (primary) hypertension: Secondary | ICD-10-CM

## 2018-07-20 MED ORDER — LOSARTAN POTASSIUM 25 MG PO TABS: 25.0000 mg | ORAL_TABLET | Freq: Every day | ORAL | 6 refills | Status: DC

## 2018-07-20 MED ORDER — HYDROCHLOROTHIAZIDE 25 MG PO TABS: 37.5000 mg | ORAL_TABLET | Freq: Every day | ORAL | 3 refills | Status: DC

## 2018-07-20 NOTE — Telephone Encounter (Signed)
Will forward to pcp

## 2018-07-20 NOTE — Telephone Encounter (Signed)
New Message   1. Name of Medication: losartan-hydrochlorothiazide (HYZAAR) 50-12.5 MG tablet  2. How are you currently taking this medication (dosage and times per day)? Take 0.5 tablets by mouth daily. - Oral  3. Are you having a reaction? no  4. What is your medication issue? Pt states Dr. Wynetta Emery instructed her to start taking this medication but it has been on back order since December so she has continued to take the Lisinopril. Now she is out of mediation all together and the Losartan is still on back order. Please advise

## 2018-07-21 NOTE — Telephone Encounter (Signed)
Could you let pt know

## 2018-07-27 NOTE — Telephone Encounter (Signed)
Patient called to get an update on her losartan. Please follow up

## 2018-08-28 ENCOUNTER — Encounter: Payer: Self-pay | Admitting: Internal Medicine

## 2018-08-28 ENCOUNTER — Other Ambulatory Visit: Payer: Self-pay

## 2018-08-28 ENCOUNTER — Ambulatory Visit: Payer: Medicare Other | Attending: Internal Medicine | Admitting: Internal Medicine

## 2018-08-28 VITALS — BP 138/86 | HR 86 | Temp 97.5°F | Resp 16 | Wt 226.2 lb

## 2018-08-28 DIAGNOSIS — K439 Ventral hernia without obstruction or gangrene: Secondary | ICD-10-CM | POA: Diagnosis not present

## 2018-08-28 DIAGNOSIS — T451X5A Adverse effect of antineoplastic and immunosuppressive drugs, initial encounter: Secondary | ICD-10-CM | POA: Insufficient documentation

## 2018-08-28 DIAGNOSIS — R928 Other abnormal and inconclusive findings on diagnostic imaging of breast: Secondary | ICD-10-CM

## 2018-08-28 DIAGNOSIS — E1142 Type 2 diabetes mellitus with diabetic polyneuropathy: Secondary | ICD-10-CM | POA: Insufficient documentation

## 2018-08-28 DIAGNOSIS — E785 Hyperlipidemia, unspecified: Secondary | ICD-10-CM | POA: Diagnosis not present

## 2018-08-28 DIAGNOSIS — H409 Unspecified glaucoma: Secondary | ICD-10-CM | POA: Insufficient documentation

## 2018-08-28 DIAGNOSIS — Z7901 Long term (current) use of anticoagulants: Secondary | ICD-10-CM | POA: Diagnosis not present

## 2018-08-28 DIAGNOSIS — Z6839 Body mass index (BMI) 39.0-39.9, adult: Secondary | ICD-10-CM | POA: Insufficient documentation

## 2018-08-28 DIAGNOSIS — E669 Obesity, unspecified: Secondary | ICD-10-CM | POA: Insufficient documentation

## 2018-08-28 DIAGNOSIS — Z79899 Other long term (current) drug therapy: Secondary | ICD-10-CM | POA: Diagnosis not present

## 2018-08-28 DIAGNOSIS — Z794 Long term (current) use of insulin: Secondary | ICD-10-CM | POA: Insufficient documentation

## 2018-08-28 DIAGNOSIS — E1165 Type 2 diabetes mellitus with hyperglycemia: Secondary | ICD-10-CM | POA: Insufficient documentation

## 2018-08-28 DIAGNOSIS — G4733 Obstructive sleep apnea (adult) (pediatric): Secondary | ICD-10-CM | POA: Diagnosis not present

## 2018-08-28 DIAGNOSIS — I10 Essential (primary) hypertension: Secondary | ICD-10-CM | POA: Insufficient documentation

## 2018-08-28 DIAGNOSIS — Z8249 Family history of ischemic heart disease and other diseases of the circulatory system: Secondary | ICD-10-CM | POA: Insufficient documentation

## 2018-08-28 DIAGNOSIS — G620 Drug-induced polyneuropathy: Secondary | ICD-10-CM | POA: Insufficient documentation

## 2018-08-28 DIAGNOSIS — IMO0002 Reserved for concepts with insufficient information to code with codable children: Secondary | ICD-10-CM

## 2018-08-28 LAB — GLUCOSE, POCT (MANUAL RESULT ENTRY): POC Glucose: 216 mg/dl — AB (ref 70–99)

## 2018-08-28 MED ORDER — GLIMEPIRIDE 2 MG PO TABS: 2.0000 mg | ORAL_TABLET | Freq: Every day | ORAL | 6 refills | Status: DC

## 2018-08-28 MED ORDER — LOSARTAN POTASSIUM 50 MG PO TABS: 50.0000 mg | ORAL_TABLET | Freq: Every day | ORAL | 6 refills | Status: DC

## 2018-08-28 MED ORDER — GLIMEPIRIDE 4 MG PO TABS: 4.0000 mg | ORAL_TABLET | Freq: Every day | ORAL | 6 refills | Status: DC

## 2018-08-28 NOTE — Progress Notes (Signed)
Patient ID: Sarah [name], female    DOB: 07-04-1965  MRN: [id number]  CC: Hypertension and Diabetes   Subjective: Sarah [name] is a 53 y.o. female who presents for chronic ds management Her concerns today include:  DM with peripheral neuropathy, HTN, HL, OSA, midline LBP,stage IIIc low-grade serous carcinoma of the ovaryS/p BSO, omentectomy, appendectomy on 07/28/14. S/p adjuvant chemotherapy with 6 cycles of paclitaxel and carboplatin completed on 12/08/14. S/p an ex lap, hernia repair and small bowel resection for a presumed recurrence (benign pathology)  DIABETES TYPE 2 Last A1C:   Results for orders placed or performed in visit on 08/28/18  POCT glucose (manual entry)  Result Value Ref Range   POC Glucose 216 (A) 70 - 99 mg/dl    Med Adherence:  [x]  Yes    []  No Medication side effects:  []  Yes    [x]  No Home Monitoring?  [x]  Yes BID    []  No Home glucose results range: 134-135 in a.m but this a.m was 187; bedtime 180-190s.  BS here is 216 and she has not eaten as yet for the a.m Diet Adherence: "Not bad but not great either.  I don't eat a lot."  Love chips as snack.  Exercise: [x]  Yes, "a little walking twice a wk."    []  No Hypoglycemic episodes?: []  Yes    [x]  No Numbness of the feet? [x]  Yes that started after she was on chemo    []  No Retinopathy hx? []  Yes    [x]  No.  Has glaucoma BL Last eye exam: 07/2018, Dr. Sherral Hammers Comments:   HYPERTENSION Currently taking: see medication list Med Adherence: [x]  Yes    []  No Medication side effects: []  Yes    [x]  No Adherence with salt restriction: [x]  Yes    []  No Home Monitoring?: [x]  Yes    []  No Monitoring Frequency: []  Yes    []  No Home BP results range: []  Yes    []  No SOB? [x]  Yes only if I over do it   []  No Chest Pain?: []  Yes    [x]  No Leg swelling?: []  Yes    [x]  No Headaches?: []  Yes    [x]  No Dizziness? []  Yes    [x]  No Comments:   HL:  Tolerating Crestor  She has an incisional abdominal hernia.  She reports  that it has been sore intermittently over the past few months.  Soreness occurs only if she over extends herself like running too much errands during the day.  She does not do any lifting or excessive pushing/pulling.  She denies any soreness or pain with meals.  She is moving her bowels okay.  She denies any nausea or vomiting.  Had mammogram recently.  She was called back for follow-up study on the right breast as they had seen a mass.  It was determined to be probable inflammatory lymph node around the nipple.  Patient does not feel any mass in her breast at this time.  She is due for repeat imaging in 6 months. Patient Active Problem List   Diagnosis Date Noted  . Hiatal hernia with GERD without esophagitis 06/18/2017  . Need for influenza vaccination 02/17/2017  . Ventral hernia 01/24/2016  . Type 2 diabetes mellitus without complication, without long-term current use of insulin ([place]) 10/04/2015  . Obesity (BMI 30-39.9) 10/04/2015  . Dyslipidemia 07/27/2015  . Neuropathic pain of both legs 07/27/2015  . Obesity (BMI 30.0-34.9) 12/24/2014  . Genetic testing 11/01/2014  .  Constipation - functional 09/24/2014  . Chemotherapy-induced peripheral neuropathy ([place]) 09/24/2014  . Midline low back pain without sciatica 09/24/2014  . Family history of colon cancer   . Primary peritoneal carcinomatosis ([place]) 08/21/2014  . Ovarian cancer, right ([place]) 07/08/2014  . Hilar adenopathy   . Essential hypertension   . Pulmonary nodule      Current Outpatient Medications on File Prior to Visit  Medication Sig Dispense Refill  . ACCU-CHEK SMARTVIEW test strip TID and a bedtime. E11.9 100 each 12  . amLODipine (NORVASC) 10 MG tablet Take 1 tablet (10 mg total) by mouth daily. 90 tablet 3  . blood glucose meter kit and supplies Dispense based on patient and insurance preference. Use up to four times daily as directed. (FOR ICD-9 250.00, 250.01). 1 each 0  . gabapentin (NEURONTIN) 300 MG capsule 1 capsule  p.o. every morning and q. noon and 2 tabs p.o. at bedtime 120 capsule 11  . glimepiride (AMARYL) 2 MG tablet Take 1 tablet (2 mg total) by mouth daily with breakfast. 30 tablet 6  . hydrALAZINE (APRESOLINE) 25 MG tablet Take 1 tablet (25 mg total) by mouth 3 (three) times daily. 360 tablet 1  . hydrochlorothiazide (HYDRODIURIL) 25 MG tablet Take 1.5 tablets (37.5 mg total) by mouth daily. 135 tablet 3  . Insulin Pen Needle 31G X 5 MM MISC For use with lantus pen for injections once daily 50 each 10  . Lancets (ACCU-CHEK MULTICLIX) lancets   0  . latanoprost (XALATAN) 0.005 % ophthalmic solution Place 1 drop into both eyes at bedtime.    Marland Kitchen losartan (COZAAR) 25 MG tablet Take 1 tablet (25 mg total) by mouth daily. 30 tablet 6  . metFORMIN (GLUCOPHAGE) 1000 MG tablet Take 1 tablet (1,000 mg total) by mouth 2 (two) times daily with a meal. 180 tablet 3  . metoprolol tartrate (LOPRESSOR) 25 MG tablet TAKE 1/2 TABLET BY MOUTH 2 TIMES A DAY 90 tablet 3  . rosuvastatin (CRESTOR) 10 MG tablet Take 1 tablet (10 mg total) by mouth daily. Stop Atorvastatin 30 tablet 6  . timolol (TIMOPTIC) 0.5 % ophthalmic solution Place 1 drop into both eyes daily.     No current facility-administered medications on file prior to visit.     Allergies  Allergen Reactions  . Emend [Aprepitant] Other (See Comments)    Urticaria   . Lisinopril Cough    Social History   Socioeconomic History  . Marital status: Married    Spouse name: Not on file  . Number of children: Not on file  . Years of education: Not on file  . Highest education level: Not on file  Occupational History  . Not on file  Social Needs  . Financial resource strain: Not on file  . Food insecurity:    Worry: Not on file    Inability: Not on file  . Transportation needs:    Medical: Not on file    Non-medical: Not on file  Tobacco Use  . Smoking status: Never Smoker  . Smokeless tobacco: Never Used  Substance and Sexual Activity  .  Alcohol use: No  . Drug use: No  . Sexual activity: Not on file  Lifestyle  . Physical activity:    Days per week: Not on file    Minutes per session: Not on file  . Stress: Not on file  Relationships  . Social connections:    Talks on phone: Not on file    Gets together: Not  on file    Attends religious service: Not on file    Active member of club or organization: Not on file    Attends meetings of clubs or organizations: Not on file    Relationship status: Not on file  . Intimate partner violence:    Fear of current or ex partner: Not on file    Emotionally abused: Not on file    Physically abused: Not on file    Forced sexual activity: Not on file  Other Topics Concern  . Not on file  Social History Narrative  . Not on file    Family History  Problem Relation Age of Onset  . Hypertension Mother   . Hypertension Father   . Diabetes Father   . Cancer Sister 50       fibrosarcoma (back); currently 61  . Prostate cancer Maternal Uncle   . Colon cancer Paternal Aunt        Dx 9s; deceased 45s  . Prostate cancer Paternal Uncle        currently 44  . Cancer Paternal Uncle 62       "bone" ; unk. primary  . Stomach cancer Paternal Uncle     Past Surgical History:  Procedure Laterality Date  . ABDOMINAL HYSTERECTOMY    . FOOT SURGERY  04/2018   [facility]  . INCISIONAL HERNIA REPAIR N/A 07/06/2015   Procedure: INCISIONAL HERNIA REPAIR ;  Surgeon: Rolm Bookbinder, MD;  Location: [facility];  Service: General;  Laterality: N/A;  . INSERTION OF MESH N/A 07/06/2015   Procedure: WITH INSERTION OF PHASIX ST MESH;  Surgeon: Rolm Bookbinder, MD;  Location: [facility];  Service: General;  Laterality: N/A;  . LAPAROTOMY N/A 07/06/2015   Procedure: EXPLORATORY LAPAROTOMY;  Surgeon: Everitt Amber, MD;  Location: [facility];  Service: Gynecology;  Laterality: N/A;  . LYSIS OF ADHESION N/A 07/06/2015   Procedure:  LYSIS OF ADHESION RESECTION OF MESENTERIC MASS BOWEL RESECTION ;  Surgeon:  Everitt Amber, MD;  Location: [facility];  Service: Gynecology;  Laterality: N/A;  . ROBOTIC ASSISTED TOTAL HYSTERECTOMY WITH BILATERAL SALPINGO OOPHERECTOMY Bilateral 07/28/2014   Procedure: ROBOTIC ASSISTED lysis of adhesions with biopsies, converted to LAPAROTOMY, bilateral salpingoorphorectomy, omentectomy,appendectomy;  Surgeon: Janie Morning, MD;  Location: [facility];  Service: Gynecology;  Laterality: Bilateral;  . THYROIDECTOMY, PARTIAL      ROS: Review of Systems Negative except as stated above  PHYSICAL EXAM: BP 138/86   Pulse 86   Temp (!) 97.5 F (36.4 C) (Oral)   Resp 16   Wt 226 lb 3.2 oz (102.6 kg)   SpO2 96%   BMI 37.64 kg/m   Wt Readings from Last 3 Encounters:  08/28/18 226 lb 3.2 oz (102.6 kg)  05/22/18 222 lb 14.4 oz (101.1 kg)  05/22/18 222 lb 3.2 oz (100.8 kg)   BP 140/90 Physical Exam General appearance - alert, well appearing, and in no distress Mental status - normal mood, behavior, speech, dress, motor activity, and thought processes Neck - supple, no significant adenopathy Chest - clear to auscultation, no wheezes, rales or rhonchi, symmetric air entry Heart - normal rate, regular rhythm, normal S1, S2, no murmurs, rubs, clicks or gallops Abdomen -large midline ventral hernia in the mid abdomen. Breasts - breasts appear normal, no suspicious masses, no skin or nipple changes or axillary nodes Extremities - peripheral pulses normal, no pedal edema, no clubbing or cyanosis Diabetic Foot Exam - Simple   Simple Foot Form Visual Inspection No  deformities, no ulcerations, no other skin breakdown bilaterally:  Yes Sensation Testing Intact to touch and monofilament testing bilaterally:  Yes Pulse Check Posterior Tibialis and Dorsalis pulse intact bilaterally:  Yes Comments     Results for orders placed or performed in visit on 08/28/18  POCT glucose (manual entry)  Result Value Ref Range   POC Glucose 216 (A) 70 - 99 mg/dl    CMP Latest Ref Rng & Units  11/17/2017 11/20/2016 04/22/2016  Glucose 65 - 99 mg/dL 202(H) - 142(H)  BUN 6 - 24 mg/dL 15 19.1 20.3  Creatinine 0.57 - 1.00 mg/dL 1.03(H) 1.1 1.1  Sodium 134 - 144 mmol/L 142 - 138  Potassium 3.5 - 5.2 mmol/L 4.1 - 3.9  Chloride 96 - 106 mmol/L 100 - -  CO2 20 - 29 mmol/L 22 - 26  Calcium 8.7 - 10.2 mg/dL 9.7 - 9.8  Total Protein 6.0 - 8.5 g/dL 7.4 - 8.2  Total Bilirubin 0.0 - 1.2 mg/dL 0.6 - 0.44  Alkaline Phos 39 - 117 IU/L 65 - 74  AST 0 - 40 IU/L 29 - 27  ALT 0 - 32 IU/L 31 - 24   Lipid Panel     Component Value Date/Time   CHOL 153 11/17/2017 1004   TRIG 204 (H) 11/17/2017 1004   HDL 37 (L) 11/17/2017 1004   CHOLHDL 4.1 11/17/2017 1004   CHOLHDL 5.1 (H) 08/14/2016 1729   VLDL 74 (H) 08/14/2016 1729   LDLCALC 75 11/17/2017 1004    CBC    Component Value Date/Time   WBC 7.0 11/17/2017 1004   WBC 7.0 04/22/2016 0750   WBC 9.4 07/10/2015 0539   RBC 4.29 11/17/2017 1004   RBC 4.23 04/22/2016 0750   RBC 3.91 07/10/2015 0539   HGB 13.0 11/17/2017 1004   HGB 12.9 04/22/2016 0750   HCT 38.0 11/17/2017 1004   HCT 39.0 04/22/2016 0750   PLT 309 11/17/2017 1004   MCV 89 11/17/2017 1004   MCV 92.2 04/22/2016 0750   MCH 30.3 11/17/2017 1004   MCH 30.6 04/22/2016 0750   MCH 30.4 07/10/2015 0539   MCHC 34.2 11/17/2017 1004   MCHC 33.2 04/22/2016 0750   MCHC 32.5 07/10/2015 0539   RDW 15.0 11/17/2017 1004   RDW 13.9 04/22/2016 0750   LYMPHSABS 2.7 04/22/2016 0750   MONOABS 1.0 (H) 04/22/2016 0750   EOSABS 0.1 04/22/2016 0750   BASOSABS 0.0 04/22/2016 0750    ASSESSMENT AND PLAN: 1. Uncontrolled type 2 diabetes mellitus with peripheral neuropathy (HCC) Blood sugar today is 216 fasting.  I do not have an A1c back as yet however I recommend increasing the Amaryl to 4 mg daily - POCT glucose (manual entry) - Hemoglobin A1c - glimepiride (AMARYL) 4 MG tablet; Take 1 tablet (4 mg total) by mouth daily with breakfast.  Dispense: 30 tablet; Refill: 6 - Basic Metabolic  Panel  2. Neuropathy due to chemotherapeutic drug [facility]) Doing well on gabapentin.  3. Essential hypertension Increase Cozaar to 50 mg daily - losartan (COZAAR) 50 MG tablet; Take 1 tablet (50 mg total) by mouth daily.  Dispense: 30 tablet; Refill: 6  4. Ventral hernia without obstruction or gangrene Discussed sending her to the surgeon Dr. Virgina Jock who had done a temporary mesh at the time of her last exploratory surgery.  Patient wants to hold off for now given the coronavirus outbreak.  However I went over signs symptoms that would suggest need to be seen urgently/emergently including pain over the  hernia with meals, nausea and vomiting with meals, unable to have a bowel movement.  Advised to avoid heavy lifting, pushing and pulling  5. Abnormal mammogram of right breast Follow-up imaging revealed that this was an inflammatory lymph node.  She has six-month follow-up imaging planned and she is okay with this.  6. Hyperlipidemia, unspecified hyperlipidemia type Continue statin     Patient was given the opportunity to ask questions.  Patient verbalized understanding of the plan and was able to repeat key elements of the plan.   Orders Placed This Encounter  Procedures  . Hemoglobin A1c  . POCT glucose (manual entry)     Requested Prescriptions    No prescriptions requested or ordered in this encounter    No follow-ups on file.  Karle Plumber, MD, FACP

## 2018-08-28 NOTE — Patient Instructions (Signed)
Your blood pressure is not at goal.  The goal is 130/80 or lower.  We have increased the losartan to 50 mg daily.   We have increase glimepiride to 4 mg daily.   Hernia, Adult     A hernia is the bulging of an organ or tissue through a weak spot in the muscles of the abdomen (abdominal wall). Hernias develop most often near the belly button (navel) or the area where the leg meets the lower abdomen (groin). Common types of hernias include:  Incisional hernia. This type bulges through a scar from an abdominal surgery.  Umbilical hernia. This type develops near the navel.  Inguinal hernia. This type develops in the groin or scrotum.  Femoral hernia. This type develops under the groin, in the upper thigh area.  Hiatal hernia. This type occurs when part of the stomach slides above the muscle that separates the abdomen from the chest (diaphragm). What are the causes? This condition may be caused by:  Heavy lifting.  Coughing over a long period of time.  Straining to have a bowel movement. Constipation can lead to straining.  An incision made during an abdominal surgery.  A physical problem that is present at birth (congenital defect).  Being overweight or obese.  Smoking.  Excess fluid in the abdomen.  Undescended testicles in males. What are the signs or symptoms? The main symptom is a skin-colored, rounded bulge in the area of the hernia. However, a bulge may not always be present. It may grow bigger or be more visible when you cough or strain (such as when lifting something heavy). A hernia that can be pushed back into the area (is reducible) rarely causes pain. A hernia that cannot be pushed back into the area (is incarcerated) may lose its blood supply (become strangulated). A hernia that is incarcerated may cause:  Pain.  Fever.  Nausea and vomiting.  Swelling.  Constipation. How is this diagnosed? A hernia may be diagnosed based on:  Your symptoms and  medical history.  A physical exam. Your health care provider may ask you to cough or move in certain ways to see if the hernia becomes visible.  Imaging tests, such as: ? X-rays. ? Ultrasound. ? CT scan. How is this treated? A hernia that is small and painless may not need to be treated. A hernia that is large or painful may be treated with surgery. Inguinal hernias may be treated with surgery to prevent incarceration or strangulation. Strangulated hernias are always treated with surgery because a lack of blood supply to the trapped organ or tissue can cause it to die. Surgery to treat a hernia involves pushing the bulge back into place and repairing the weak area of the muscle or abdominal wall. Follow these instructions at home: Activity  Avoid straining.  Do not lift anything that is heavier than 10 lb (4.5 kg), or the limit that you are told, until your health care provider says that it is safe.  When lifting heavy objects, lift with your leg muscles, not your back muscles. Preventing constipation  Take actions to prevent constipation. Constipation leads to straining with bowel movements, which can make a hernia worse or cause a hernia repair to break down. Your health care provider may recommend that you: ? Drink enough fluid to keep your urine pale yellow. ? Eat foods that are high in fiber, such as fresh fruits and vegetables, whole grains, and beans. ? Limit foods that are high in fat and processed  sugars, such as fried or sweet foods. ? Take an over-the-counter or prescription medicine for constipation. General instructions  When coughing, try to cough gently.  You may try to push the hernia back in place by very gently pressing on it while lying down. Do not try to force the bulge back in if it will not push in easily.  If you are overweight, work with your health care provider to lose weight safely.  Do not use any products that contain nicotine or tobacco, such as  cigarettes and e-cigarettes. If you need help quitting, ask your health care provider.  If you are scheduled for hernia repair, watch your hernia for any changes in shape, size, or color. Tell your health care provider about any changes or new symptoms.  Take over-the-counter and prescription medicines only as told by your health care provider.  Keep all follow-up visits as told by your health care provider. This is important. Contact a health care provider if:  You develop new pain, swelling, or redness around your hernia.  You have signs of constipation, such as: ? Fewer bowel movements in a week than normal. ? Difficulty having a bowel movement. ? Stools that are dry, hard, or larger than normal. Get help right away if:  You have a fever.  You have abdomen pain that gets worse.  You feel nauseous or you vomit.  You cannot push the hernia back in place by very gently pressing on it while lying down. Do not try to force the bulge back in if it will not push in easily.  The hernia: ? Changes in shape, size, or color. ? Feels hard or tender. These symptoms may represent a serious problem that is an emergency. Do not wait to see if the symptoms will go away. Get medical help right away. Call your local emergency services (911 in the [place] Summary  A hernia is the bulging of an organ or tissue through a weak spot in the muscles of the abdomen (abdominal wall).  The main symptom is a skin-colored, rounded lump (bulge) in the hernia area. However, a bulge may not always be present. It may grow bigger or more visible when you cough or strain (such as when having a bowel movement).  A hernia that is small and painless may not need to be treated. A hernia that is large or painful may be treated with surgery.  Surgery to treat a hernia involves pushing the bulge back into place and repairing the weak part of the abdomen. This information is not intended to replace advice given to you by  your health care provider. Make sure you discuss any questions you have with your health care provider. Document Released: 05/27/2005 Document Revised: 02/26/2017 Document Reviewed: 02/26/2017 Elsevier Interactive Patient Education  2019 [employer].

## 2018-08-29 LAB — BASIC METABOLIC PANEL
BUN/Creatinine Ratio: 18 (ref 9–23)
BUN: 20 mg/dL (ref 6–24)
CHLORIDE: 95 mmol/L — AB (ref 96–106)
CO2: 21 mmol/L (ref 20–29)
Calcium: 10 mg/dL (ref 8.7–10.2)
Creatinine, Ser: 1.11 mg/dL — ABNORMAL HIGH (ref 0.57–1.00)
GFR calc Af Amer: 66 mL/min/{1.73_m2} (ref >59)
GFR calc non Af Amer: 57 mL/min/{1.73_m2} — ABNORMAL LOW (ref >59)
Glucose: 223 mg/dL — ABNORMAL HIGH (ref 65–99)
Potassium: 3.9 mmol/L (ref 3.5–5.2)
Sodium: 138 mmol/L (ref 134–144)

## 2018-08-29 LAB — HEMOGLOBIN A1C
Est. average glucose Bld gHb Est-mCnc: 229 mg/dL
Hgb A1c MFr Bld: 9.6 % — ABNORMAL HIGH (ref 4.8–5.6)

## 2018-08-31 ENCOUNTER — Telehealth: Payer: Self-pay

## 2018-08-31 NOTE — Telephone Encounter (Signed)
Contacted pt to go over lab results left a detailed vm informing pt of results and if she has any questions or concerns to give me a call

## 2018-09-19 NOTE — Telephone Encounter (Signed)
done

## 2018-10-01 ENCOUNTER — Other Ambulatory Visit: Payer: Self-pay | Admitting: Pharmacist

## 2018-10-01 DIAGNOSIS — E1165 Type 2 diabetes mellitus with hyperglycemia: Principal | ICD-10-CM

## 2018-10-01 DIAGNOSIS — E1142 Type 2 diabetes mellitus with diabetic polyneuropathy: Secondary | ICD-10-CM

## 2018-10-01 DIAGNOSIS — IMO0002 Reserved for concepts with insufficient information to code with codable children: Secondary | ICD-10-CM

## 2018-10-01 DIAGNOSIS — E785 Hyperlipidemia, unspecified: Secondary | ICD-10-CM

## 2018-10-01 MED ORDER — GLIMEPIRIDE 4 MG PO TABS: 4.0000 mg | ORAL_TABLET | Freq: Every day | ORAL | 0 refills | Status: DC

## 2018-10-01 MED ORDER — ROSUVASTATIN CALCIUM 10 MG PO TABS: 10.0000 mg | ORAL_TABLET | Freq: Every day | ORAL | 0 refills | Status: DC

## 2018-11-03 DIAGNOSIS — H53413 Scotoma involving central area, bilateral: Secondary | ICD-10-CM | POA: Diagnosis not present

## 2018-11-03 DIAGNOSIS — H401132 Primary open-angle glaucoma, bilateral, moderate stage: Secondary | ICD-10-CM | POA: Diagnosis not present

## 2018-11-23 ENCOUNTER — Inpatient Hospital Stay: Payer: Medicare Other | Attending: Gynecologic Oncology | Admitting: Gynecologic Oncology

## 2018-11-23 ENCOUNTER — Encounter: Payer: Self-pay | Admitting: Gynecologic Oncology

## 2018-11-23 ENCOUNTER — Inpatient Hospital Stay: Payer: Medicare Other

## 2018-11-23 ENCOUNTER — Other Ambulatory Visit: Payer: Self-pay

## 2018-11-23 VITALS — BP 161/84 | HR 85 | Temp 98.2°F | Resp 18 | Ht 65.0 in | Wt 227.4 lb

## 2018-11-23 DIAGNOSIS — Z90722 Acquired absence of ovaries, bilateral: Secondary | ICD-10-CM | POA: Diagnosis not present

## 2018-11-23 DIAGNOSIS — Z90710 Acquired absence of both cervix and uterus: Secondary | ICD-10-CM | POA: Diagnosis not present

## 2018-11-23 DIAGNOSIS — C561 Malignant neoplasm of right ovary: Secondary | ICD-10-CM | POA: Diagnosis not present

## 2018-11-23 DIAGNOSIS — K432 Incisional hernia without obstruction or gangrene: Secondary | ICD-10-CM | POA: Diagnosis not present

## 2018-11-23 DIAGNOSIS — Z9221 Personal history of antineoplastic chemotherapy: Secondary | ICD-10-CM

## 2018-11-23 DIAGNOSIS — K439 Ventral hernia without obstruction or gangrene: Secondary | ICD-10-CM

## 2018-11-23 LAB — BASIC METABOLIC PANEL - CANCER CENTER ONLY
Anion gap: 18 — ABNORMAL HIGH (ref 5–15)
BUN: 16 mg/dL (ref 6–20)
CO2: 23 mmol/L (ref 22–32)
Calcium: 10.4 mg/dL — ABNORMAL HIGH (ref 8.9–10.3)
Chloride: 99 mmol/L (ref 98–111)
Creatinine: 1.29 mg/dL — ABNORMAL HIGH (ref 0.44–1.00)
GFR, Est AFR Am: 55 mL/min — ABNORMAL LOW (ref >60)
GFR, Estimated: 48 mL/min — ABNORMAL LOW (ref >60)
Glucose, Bld: 231 mg/dL — ABNORMAL HIGH (ref 70–99)
Potassium: 4.1 mmol/L (ref 3.5–5.1)
Sodium: 140 mmol/L (ref 135–145)

## 2018-11-23 NOTE — Progress Notes (Signed)
POST-TREATMENT FOLLOWUP. TREATMENT COUNSELING.  Assessment:   53 y.o. 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   S/p BSO, omentectomy, appendectomy on 07/28/14. S/p adjuvant chemotherapy with 6 cycles of paclitaxel and carboplatin. NED since 12/08/14. S/p an ex lap, hernia repair and small bowel resection for a presumed recurrence (benign pathology). Recurrent ventral hernia   No evidence of recurrent disease on exam,  recommend check CA 125 today.   Plan: 1) Recurrent ventral hernia: She will see [name]. We will order a CT scan prior to her surgery to ensure there is no recurrence that needs addressing at the time of surgery (though probability for this is unlikely given her enduring complete clinical response to date and normal tumor markers). 2) Treatment counseling -  I recommend continuing routine surveillance with visit in 6 months with CA 125 assessments (CT scans on a prn basis for symptoms or rise in CA 125). I will see her back in December, 2020.   HPI:  Sarah [name] is a 53 y.o. year old initially seen in consultation on 07/08/14 for bilateral ovarian masses and ascites. This was diagnosed at the time of admission to the hospital for hypertensive crisis. This diagnosis of hypertension was new and she was newly started on a medical regimen for this. She then underwent an exploratory laparotomy, BSO, omentectomy, appendectomy on 07/28/14 with Dr Skeet Latch without complications.  Her postoperative course was uncomplicated.  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 This represented a stage IIIc disease process.  She received 6 cycles of adjuvant chemotherapy with paclitaxel and carboplatin with Dr Marko Plume on 08/25/14 to 12/08/14.  Pretreatment CA 125 was 58 on 06/30/14, and was 20 on 12/22/14 (it's nadir had been 16 on 4/14/ 16).  Post treatment CT of  the chest, abdomen and pelvis on 01/12/15 revealed: Previously noted paratracheal, subcarinal and prevascular mediastinal lymphadenopathy has resolved, with no residual pathologically enlarged mediastinal lymph nodes. Resolved hilar lymphadenopathy, with no residual pathologically enlarged mediastinal nodes. The previously described 3 mm subpleural left lower lobe pulmonary nodule is decreased to 2 mm (series 4/image 41). A separate 2 mm subpleural left lower lobe pulmonary nodule (4/29) is slightly decreased from 33m on 06/28/2014. A subpleural 3 mm right upper lobe pulmonary nodule (4/15) is unchanged from 06/28/2014. A 2 mm subpleural pulmonary nodule in the superior segment right lower lobe associated with the right major fissure (4/21) appears new. A 0.7 cm right posterior mesenteric node measures 0.7 cm short axis (series 2/image 93), decreased from 1.2 cm. No definite peritoneal tumor implants detected. A 0.4 cm nodule anterior/inferior to the spleen (series 2/ image 60) is mildly decreased from 0.7 cm. Small umbilical hernia containing a small bowel loop, with no evidence of small-bowel obstruction or strangulation.  She had a CA 125 on 03/27/15 that was normal and stable at 14.  A CT of the chest, abdomen and pelvis was performed on 04/27/15 which showed: No pulmonary mass, infiltrate, or effusion. Tiny less than 5 mm subpleural pulmonary nodules in the right upper lobe on image remain stable. No new or enlarging pulmonary nodules identified. Small left paraventral hernia containing bowel loops. New soft tissue nodularity is seen within the anterior abdominal mesenteric fat, with largest nodule measuring 10 x 17 mm. No other sites of peritoneal nodularity or soft tissue density seen.   On 07/06/15 she was taken to the OR for  an ex lap, lysis of adhesions, and small bowel resection with ventral hernia repair. FIndings were remarkable for substantial small bowel adhesions, hwoever the mass that had  been seen on imaging was not appreciated intraperitoneally. There was white nodularity and stranding on the mesentery of a segment of small bowel. It was completely resected with the small bowel resection. Pathology was benign.  Postoperatively she was noted to have very high blood glucose on acuchecks. An HbA1C was drawn on 07/07/15 and was very elevated at 10.1%. She was started on Insulin.  Her last CA 125 was 15 on 01/22/16 and 20.2 on 04/22/16 and 16.7 on 07/22/16 and 16.5 on 11/20/16.  Interval Hx:  CT abdo/pelvis for abdominal pains on 11/25/16 showed a large ventral hernia with unobstructed small bowel but no recurrence or metastases.  The patient had bunion surgery. She is seeing [name] for her hernia.   She has gained weight and has trouble with diabetes management.   Today we discussed survivorship issues including weight management with diet and exercise, stress relief, emotional wellbeing, sequelae and toxicity from chemotherapy including neuropathy, and surgery sequelae such as hernia.  Current Outpatient Medications on File Prior to Visit  Medication Sig Dispense Refill  . ACCU-CHEK SMARTVIEW test strip TID and a bedtime. E11.9 100 each 12  . amLODipine (NORVASC) 10 MG tablet Take 1 tablet (10 mg total) by mouth daily. 90 tablet 3  . blood glucose meter kit and supplies Dispense based on patient and insurance preference. Use up to four times daily as directed. (FOR ICD-9 250.00, 250.01). 1 each 0  . gabapentin (NEURONTIN) 300 MG capsule 1 capsule p.o. every morning and q. noon and 2 tabs p.o. at bedtime 120 capsule 11  . glimepiride (AMARYL) 4 MG tablet Take 1 tablet (4 mg total) by mouth daily with breakfast. 90 tablet 0  . hydrALAZINE (APRESOLINE) 25 MG tablet Take 1 tablet (25 mg total) by mouth 3 (three) times daily. 360 tablet 1  . hydrochlorothiazide (HYDRODIURIL) 25 MG tablet Take 1.5 tablets (37.5 mg total) by mouth daily. 135 tablet 3  . Insulin Pen Needle 31G X 5 MM  MISC For use with lantus pen for injections once daily 50 each 10  . Lancets (ACCU-CHEK MULTICLIX) lancets   0  . latanoprost (XALATAN) 0.005 % ophthalmic solution Place 1 drop into both eyes at bedtime.    Marland Kitchen losartan (COZAAR) 50 MG tablet Take 1 tablet (50 mg total) by mouth daily. 30 tablet 6  . metFORMIN (GLUCOPHAGE) 1000 MG tablet Take 1 tablet (1,000 mg total) by mouth 2 (two) times daily with a meal. 180 tablet 3  . metoprolol tartrate (LOPRESSOR) 25 MG tablet TAKE 1/2 TABLET BY MOUTH 2 TIMES A DAY 90 tablet 3  . rosuvastatin (CRESTOR) 10 MG tablet Take 1 tablet (10 mg total) by mouth daily. Stop Atorvastatin 90 tablet 0  . timolol (TIMOPTIC) 0.5 % ophthalmic solution Place 1 drop into both eyes daily.     No current facility-administered medications on file prior to visit.    Allergies  Allergen Reactions  . Emend [Aprepitant] Other (See Comments)    Urticaria   . Lisinopril Cough   Past Medical History:  Diagnosis Date  . Arthritis   . Family history of colon cancer   . Glaucoma 02/16  . Glucagonoma 07/28/14   Pt denies this but states she has glaucoma  . History of chemotherapy   . Hypertension   . Imbalance   . Neuropathy  feet bilat  . Nocturia   . Peritoneal carcinomatosis ([place])    carcinoma of ovary   . Shortness of breath dyspnea    hx of 2013 - no problems currently   . Thyroid nodule    history of   . Wears glasses    Past Surgical History:  Procedure Laterality Date  . ABDOMINAL HYSTERECTOMY    . FOOT SURGERY  04/2018   [facility]  . INCISIONAL HERNIA REPAIR N/A 07/06/2015   Procedure: INCISIONAL HERNIA REPAIR ;  Surgeon: Rolm Bookbinder, MD;  Location: [facility];  Service: General;  Laterality: N/A;  . INSERTION OF MESH N/A 07/06/2015   Procedure: WITH INSERTION OF PHASIX ST MESH;  Surgeon: Rolm Bookbinder, MD;  Location: [facility];  Service: General;  Laterality: N/A;  . LAPAROTOMY N/A 07/06/2015   Procedure: EXPLORATORY LAPAROTOMY;  Surgeon: Everitt Amber, MD;  Location: [facility];  Service: Gynecology;  Laterality: N/A;  . LYSIS OF ADHESION N/A 07/06/2015   Procedure:  LYSIS OF ADHESION RESECTION OF MESENTERIC MASS BOWEL RESECTION ;  Surgeon: Everitt Amber, MD;  Location: [facility];  Service: Gynecology;  Laterality: N/A;  . ROBOTIC ASSISTED TOTAL HYSTERECTOMY WITH BILATERAL SALPINGO OOPHERECTOMY Bilateral 07/28/2014   Procedure: ROBOTIC ASSISTED lysis of adhesions with biopsies, converted to LAPAROTOMY, bilateral salpingoorphorectomy, omentectomy,appendectomy;  Surgeon: Janie Morning, MD;  Location: [facility];  Service: Gynecology;  Laterality: Bilateral;  . THYROIDECTOMY, PARTIAL     Family History  Problem Relation Age of Onset  . Hypertension Mother   . Hypertension Father   . Diabetes Father   . Cancer Sister 50       fibrosarcoma (back); currently 5  . Prostate cancer Maternal Uncle   . Colon cancer Paternal Aunt        Dx 50s; deceased 10s  . Prostate cancer Paternal Uncle        currently 49  . Cancer Paternal Uncle 28       "bone" ; unk. primary  . Stomach cancer Paternal Uncle    Social History   Socioeconomic History  . Marital status: Married    Spouse name: Not on file  . Number of children: Not on file  . Years of education: Not on file  . Highest education level: Not on file  Occupational History  . Not on file  Social Needs  . Financial resource strain: Not on file  . Food insecurity    Worry: Not on file    Inability: Not on file  . Transportation needs    Medical: Not on file    Non-medical: Not on file  Tobacco Use  . Smoking status: Never Smoker  . Smokeless tobacco: Never Used  Substance and Sexual Activity  . Alcohol use: No  . Drug use: No  . Sexual activity: Not on file  Lifestyle  . Physical activity    Days per week: Not on file    Minutes per session: Not on file  . Stress: Not on file  Relationships  . Social Herbalist on phone: Not on file    Gets together: Not on file     Attends religious service: Not on file    Active member of club or organization: Not on file    Attends meetings of clubs or organizations: Not on file    Relationship status: Not on file  . Intimate partner violence    Fear of current or ex partner: Not on file  Emotionally abused: Not on file    Physically abused: Not on file    Forced sexual activity: Not on file  Other Topics Concern  . Not on file  Social History Narrative  . Not on file    Review of systems: Constitutional:  She has no weight gain or weight loss. She has no fever or chills. Eyes: No blurred vision Ears, Nose, Mouth, Throat: No dizziness, headaches or changes in hearing. No mouth sores. Cardiovascular: No chest pain, palpitations or edema. Respiratory:  No shortness of breath, wheezing or cough Gastrointestinal: She has normal bowel movements without diarrhea or constipation. She denies any nausea or vomiting. She denies blood in her stool or heart burn. Abdominal pain Genitourinary:  She denies pelvic pain, pelvic pressure or changes in her urinary function. She has no hematuria, dysuria, or incontinence. She has no irregular vaginal bleeding or vaginal discharge Musculoskeletal: Denies muscle weakness or joint pains.  Skin:  She has no skin changes, rashes or itching Neurological:  Denies dizziness or headaches. + bilateral foot neuropathy Psychiatric:  She denies depression or anxiety. Hematologic/Lymphatic:   No easy bruising or bleeding   Physical Exam: Blood pressure (!) 161/84, pulse 85, temperature 98.2 F (36.8 C), temperature source Oral, resp. rate 18, height 5' 5"  (1.651 m), weight 227 lb 6.4 oz (103.1 kg), SpO2 100 %. General: Well dressed, well nourished in no apparent distress.   HEENT:  Normocephalic and atraumatic, no lesions.  Extraocular muscles intact. Sclerae anicteric. Pupils equal, round, reactive. No mouth sores or ulcers. Thyroid is normal size, not nodular, midline. Abdomen:  Soft,  nontender, nondistended.  No palpable masses.  No hepatosplenomegaly.  No ascites. Normal bowel sounds. Incision is healed. 15cm soft, reducible midline upper abdominal ventral hernia. No erythema. Not tender to palpate. Genitourinary: No pelvic masses. Extremities: No cyanosis, clubbing or edema.  No calf tenderness or erythema. No palpable cords. Psychiatric: Mood and affect are appropriate. Neurological: Awake, alert and oriented x 3. Sensation is intact, no neuropathy.  Musculoskeletal: No pain, normal strength and range of motion.  Thereasa Solo, MD

## 2018-11-23 NOTE — Patient Instructions (Signed)
Please notify Dr [name] at phone number [phone] if you notice vaginal bleeding, new pelvic or abdominal pains, bloating, feeling full easy, or a change in bladder or bowel function.   Please contact Dr Serita Grit office (at [phone]) in September, 2020 to request an appointment with her for December, 2020.  Dr [name] has ordered a CT scan to evaluate your hernia better for preparation of seeing [name].   Please call Dr Cristal Generous office after your CT scan to schedule an appointment to discuss your hernia.

## 2018-11-24 LAB — CA 125: Cancer Antigen (CA) 125: 20.4 U/mL (ref 0.0–38.1)

## 2018-11-26 ENCOUNTER — Ambulatory Visit (HOSPITAL_COMMUNITY)
Admission: RE | Admit: 2018-11-26 | Discharge: 2018-11-26 | Disposition: A | Discharge status: Home / Self Care | Payer: Medicare Other | Source: Ambulatory Visit | Attending: Gynecologic Oncology | Admitting: Gynecologic Oncology

## 2018-11-26 ENCOUNTER — Other Ambulatory Visit: Payer: Self-pay

## 2018-11-26 DIAGNOSIS — C561 Malignant neoplasm of right ovary: Secondary | ICD-10-CM

## 2018-11-26 DIAGNOSIS — K439 Ventral hernia without obstruction or gangrene: Secondary | ICD-10-CM | POA: Insufficient documentation

## 2018-11-26 DIAGNOSIS — C569 Malignant neoplasm of unspecified ovary: Secondary | ICD-10-CM | POA: Diagnosis not present

## 2018-11-26 MED ORDER — IOHEXOL 300 MG/ML  SOLN
100.0000 mL | Freq: Once | INTRAMUSCULAR | Status: AC | PRN
  Administered 2018-11-26: 100 mL via INTRAVENOUS

## 2018-11-26 MED ORDER — SODIUM CHLORIDE (PF) 0.9 % IJ SOLN
INTRAMUSCULAR | Status: AC
  Filled 2018-11-26: qty 50

## 2018-11-27 ENCOUNTER — Ambulatory Visit (HOSPITAL_COMMUNITY): Payer: Medicare Other

## 2018-11-30 ENCOUNTER — Other Ambulatory Visit: Payer: Self-pay

## 2018-11-30 ENCOUNTER — Encounter: Payer: Self-pay | Admitting: Internal Medicine

## 2018-11-30 ENCOUNTER — Telehealth: Payer: Self-pay

## 2018-11-30 ENCOUNTER — Ambulatory Visit: Payer: Medicare Other | Attending: Internal Medicine | Admitting: Internal Medicine

## 2018-11-30 DIAGNOSIS — I10 Essential (primary) hypertension: Secondary | ICD-10-CM

## 2018-11-30 DIAGNOSIS — E669 Obesity, unspecified: Secondary | ICD-10-CM | POA: Diagnosis not present

## 2018-11-30 DIAGNOSIS — IMO0002 Reserved for concepts with insufficient information to code with codable children: Secondary | ICD-10-CM

## 2018-11-30 DIAGNOSIS — M545 Low back pain, unspecified: Secondary | ICD-10-CM

## 2018-11-30 DIAGNOSIS — E1142 Type 2 diabetes mellitus with diabetic polyneuropathy: Secondary | ICD-10-CM | POA: Diagnosis not present

## 2018-11-30 DIAGNOSIS — K439 Ventral hernia without obstruction or gangrene: Secondary | ICD-10-CM

## 2018-11-30 DIAGNOSIS — M546 Pain in thoracic spine: Secondary | ICD-10-CM

## 2018-11-30 DIAGNOSIS — G8929 Other chronic pain: Secondary | ICD-10-CM

## 2018-11-30 DIAGNOSIS — K760 Fatty (change of) liver, not elsewhere classified: Secondary | ICD-10-CM | POA: Diagnosis not present

## 2018-11-30 DIAGNOSIS — E1165 Type 2 diabetes mellitus with hyperglycemia: Secondary | ICD-10-CM

## 2018-11-30 MED ORDER — DICLOFENAC SODIUM 1 % TD GEL: 2.0000 g | Freq: Four times a day (QID) | TRANSDERMAL | 3 refills | Status: DC

## 2018-11-30 NOTE — Telephone Encounter (Signed)
Told Sarah [name] that Dr. [name] said that the CT scan showed no evidence of cancer. Pt verbalized understanding.

## 2018-11-30 NOTE — Progress Notes (Signed)
Patient verified DOB Patient has taken medication. Patient has eaten today. Patient complains of back pain scaled at an 8. Pain described as sharp intermittent pain. Patient states he may be related to the hernia. CBG: 158 after eating breakfast

## 2018-11-30 NOTE — Progress Notes (Signed)
Virtual Visit via Telephone Note Due to current restrictions/limitations of in-office visits due to the COVID-19 pandemic, this scheduled clinical appointment was converted to a telehealth visit  I connected with Sarah [name] on 11/30/18 at 1:48 p.m EDT by telephone and verified that I am speaking with the correct person using two identifiers. I am in my office.  The patient is at home.  Only the patient and myself participated in this encounter.  I discussed the limitations, risks, security and privacy concerns of performing an evaluation and management service by telephone and the availability of in person appointments. I also discussed with the patient that there may be a patient responsible charge related to this service. The patient expressed understanding and agreed to proceed.   History of Present Illness: DM with peripheral neuropathy (more so due to chemo due to chemo), HTN, HL, OSA, midline LBP,stage IIIc low-grade serous carcinoma of the ovaryS/p BSO, omentectomy, appendectomy on 07/28/14. S/p adjuvant chemotherapy with 6 cycles of paclitaxel and carboplatin completed on 12/08/14. S/p an ex lap, hernia repair and small bowel resection for a presumed recurrence (benign pathology).  Patient last seen 08/2018.  Today's visit is for chronic disease management.  C/o pain in mid to lower back in midline for past 1-2 yrs but worse in past 1-2 mths. Use to be intermittent but now constant.   -hurts to stand just to wash dishes -no radiation down legs or arms; no increase numbness or tingling.  No weakness -no bowel incontinence.  Some symptoms of urge incontinence in past mth -takes Tylenol 500 mg 2 tabs every other night  Ventral hernia:  She reports that it has been sore intermittently over the past several months.    Worse when she does a lot of activities during the day. She does not do any lifting or excessive pushing/pulling.  She denies any soreness or pain with meals.  She is moving her  bowels okay. Some nausea at times after meals -Had CAT scan of the abdomen done last week by her oncologist.  This revealed large supraumbilical ventral hernia containing loops of small bowel and part of the transverse colon.  Patient would like referral to the surgeon to have this taken care of.  Incidental finding on CAT scan revealed diffuse hepatic steatosis.  DIABETES TYPE 2 Last A1C:   Results for orders placed or performed in visit on 11/23/18  CA 125  Result Value Ref Range   Cancer Antigen (CA) 125 20.4 0.0 - 38.1 U/mL  Basic Metabolic Panel - Cancer Center Only  Result Value Ref Range   Sodium 140 135 - 145 mmol/L   Potassium 4.1 3.5 - 5.1 mmol/L   Chloride 99 98 - 111 mmol/L   CO2 23 22 - 32 mmol/L   Glucose, Bld 231 (H) 70 - 99 mg/dL   BUN 16 6 - 20 mg/dL   Creatinine 1.29 (H) 0.44 - 1.00 mg/dL   Calcium 10.4 (H) 8.9 - 10.3 mg/dL   GFR, Est Non Af Am 48 (L) >60 mL/min   GFR, Est AFR Am 55 (L) >60 mL/min   Anion gap 18 (H) 5 - 15    Med Adherence:  '[x]'$  Yes    '[]'$  No Medication side effects:  '[x]'$  Yes -metformin and Amaryl    Home Monitoring?  '[x]'$  Yes  TID.  Morning range 113-130, afternoons 130-180s and 2 hrs after dinner in the 200s. BS this afternoon was 159, this a.m 101 Diet Adherence:  "I've slowed down  a lot." Eating more veggies; less chips and fries Exercise: [x]  Yes - walks 2 x a wk at a nearby school    Hypoglycemic episodes?: []  Yes    [x]  No Numbness of the feet? [x]  Yes    []  No Retinopathy hx? []  Yes    [x]  No Last eye exam:  Comments:   HYPERTENSION Currently taking: see medication list Med Adherence: [x]  Yes .  BP today is 132/74 Medication side effects: []  Yes    [x]  No Adherence with salt restriction: [x]  Yes    []  No Home Monitoring?: []  Yes    []  No Monitoring Frequency:  Once a wk Home BP results range:  SBP 100-130/DBP 60-70 SOB? []  Yes    [x]  No Chest Pain?: []  Yes    [x]  No Leg swelling?: []  Yes    [x]  No Headaches?: []  Yes    [x]   No Dizziness? []  Yes    [x]  No Comments:    Outpatient Encounter Medications as of 11/30/2018  Medication Sig Note  . ACCU-CHEK SMARTVIEW test strip TID and a bedtime. E11.9   . amLODipine (NORVASC) 10 MG tablet Take 1 tablet (10 mg total) by mouth daily.   . blood glucose meter kit and supplies Dispense based on patient and insurance preference. Use up to four times daily as directed. (FOR ICD-9 250.00, 250.01).   Marland Kitchen gabapentin (NEURONTIN) 300 MG capsule 1 capsule p.o. every morning and q. noon and 2 tabs p.o. at bedtime   . glimepiride (AMARYL) 4 MG tablet Take 1 tablet (4 mg total) by mouth daily with breakfast.   . hydrALAZINE (APRESOLINE) 25 MG tablet Take 1 tablet (25 mg total) by mouth 3 (three) times daily.   . hydrochlorothiazide (HYDRODIURIL) 25 MG tablet Take 1.5 tablets (37.5 mg total) by mouth daily.   . Insulin Pen Needle 31G X 5 MM MISC For use with lantus pen for injections once daily   . Lancets (ACCU-CHEK MULTICLIX) lancets  07/24/2015: Received from: External Pharmacy  . latanoprost (XALATAN) 0.005 % ophthalmic solution Place 1 drop into both eyes at bedtime.   Marland Kitchen losartan (COZAAR) 50 MG tablet Take 1 tablet (50 mg total) by mouth daily.   . metFORMIN (GLUCOPHAGE) 1000 MG tablet Take 1 tablet (1,000 mg total) by mouth 2 (two) times daily with a meal.   . metoprolol tartrate (LOPRESSOR) 25 MG tablet TAKE 1/2 TABLET BY MOUTH 2 TIMES A DAY   . timolol (TIMOPTIC) 0.5 % ophthalmic solution Place 1 drop into both eyes daily.   . rosuvastatin (CRESTOR) 10 MG tablet Take 1 tablet (10 mg total) by mouth daily. Stop Atorvastatin    No facility-administered encounter medications on file as of 11/30/2018.       Observations/Objective:    Chemistry      Component Value Date/Time   NA 140 11/23/2018 1612   NA 138 08/28/2018 1008   NA 138 04/22/2016 0750   K 4.1 11/23/2018 1612   K 3.9 04/22/2016 0750   CL 99 11/23/2018 1612   CO2 23 11/23/2018 1612   CO2 26 04/22/2016 0750    BUN 16 11/23/2018 1612   BUN 20 08/28/2018 1008   BUN 19.1 11/20/2016 1355   CREATININE 1.29 (H) 11/23/2018 1612   CREATININE 1.1 11/20/2016 1355      Component Value Date/Time   CALCIUM 10.4 (H) 11/23/2018 1612   CALCIUM 9.8 04/22/2016 0750   ALKPHOS 65 11/17/2017 1004   ALKPHOS 74 04/22/2016 0750  AST 29 11/17/2017 1004   AST 27 04/22/2016 0750   ALT 31 11/17/2017 1004   ALT 24 04/22/2016 0750   BILITOT 0.6 11/17/2017 1004   BILITOT 0.44 04/22/2016 0750      Assessment and Plan: 1. Essential hypertension Home blood pressure readings are at goal.  She will continue amlodipine, hydralazine, HCTZ, metoprolol and Cozaar.  2. Uncontrolled type 2 diabetes mellitus with peripheral neuropathy ([place]) Blood sugars 2 hours after meal not at goal.  Dietary counseling given.  Commended her on making some positive changes in her diet.  Encouraged her to get out and walk at least 3 days a week for about 30 minutes. -She will come to the lab to have blood test done including an A1c.  Continue metformin and Amaryl for now - Hemoglobin A1c; Future - Comprehensive metabolic panel; Future - CBC; Future - Lipid panel; Future  3. Obesity (BMI 35.0-39.9 without comorbidity) See #2 above  4. Hepatic steatosis Discussed the importance of healthy eating habits and weight loss to prevent progression.  5. Ventral hernia without obstruction or gangrene Advised of signs/symptoms that would suggest incarcerated hernia and need for urgent evaluation.  Avoid heavy lifting - Ambulatory referral to General Surgery  6. Chronic midline thoracic back pain 7. Chronic midline low back pain without sciatica Differential diagnoses include arthritis or DDD I have sent a prescription for Voltaren gel to use as needed. - DG Thoracic Spine 4V; Future - DG Lumbar Spine Complete; Future   Follow Up Instructions: Follow-up in 3 months   I discussed the assessment and treatment plan with the patient. The  patient was provided an opportunity to ask questions and all were answered. The patient agreed with the plan and demonstrated an understanding of the instructions.   The patient was advised to call back or seek an in-person evaluation if the symptoms worsen or if the condition fails to improve as anticipated.  I provided 24 minutes of non-face-to-face time during this encounter.   Karle Plumber, MD

## 2018-11-30 NOTE — Telephone Encounter (Signed)
LM for patient to call the office to discuss CT results.

## 2018-12-04 ENCOUNTER — Other Ambulatory Visit: Payer: Self-pay

## 2018-12-04 ENCOUNTER — Ambulatory Visit (HOSPITAL_COMMUNITY)
Admission: RE | Admit: 2018-12-04 | Discharge: 2018-12-04 | Disposition: A | Discharge status: Home / Self Care | Payer: Medicare Other | Source: Ambulatory Visit | Attending: Internal Medicine | Admitting: Internal Medicine

## 2018-12-04 ENCOUNTER — Other Ambulatory Visit: Payer: Self-pay | Admitting: Internal Medicine

## 2018-12-04 ENCOUNTER — Ambulatory Visit: Payer: Medicare Other

## 2018-12-04 DIAGNOSIS — G8929 Other chronic pain: Secondary | ICD-10-CM

## 2018-12-04 DIAGNOSIS — E1142 Type 2 diabetes mellitus with diabetic polyneuropathy: Secondary | ICD-10-CM

## 2018-12-04 DIAGNOSIS — M545 Low back pain, unspecified: Secondary | ICD-10-CM

## 2018-12-04 DIAGNOSIS — E1165 Type 2 diabetes mellitus with hyperglycemia: Secondary | ICD-10-CM | POA: Diagnosis not present

## 2018-12-04 DIAGNOSIS — M546 Pain in thoracic spine: Secondary | ICD-10-CM | POA: Insufficient documentation

## 2018-12-04 DIAGNOSIS — IMO0002 Reserved for concepts with insufficient information to code with codable children: Secondary | ICD-10-CM

## 2018-12-05 LAB — HEMOGLOBIN A1C
Est. average glucose Bld gHb Est-mCnc: 263 mg/dL
Hgb A1c MFr Bld: 10.8 % — ABNORMAL HIGH (ref 4.8–5.6)

## 2018-12-05 LAB — CBC
Hematocrit: 41 % (ref 34.0–46.6)
Hemoglobin: 13.6 g/dL (ref 11.1–15.9)
MCH: 29.1 pg (ref 26.6–33.0)
MCHC: 33.2 g/dL (ref 31.5–35.7)
MCV: 88 fL (ref 79–97)
Platelets: 332 10*3/uL (ref 150–450)
RBC: 4.67 x10E6/uL (ref 3.77–5.28)
RDW: 13.6 % (ref 11.7–15.4)
WBC: 7.7 10*3/uL (ref 3.4–10.8)

## 2018-12-05 LAB — COMPREHENSIVE METABOLIC PANEL
ALT: 28 IU/L (ref 0–32)
AST: 33 IU/L (ref 0–40)
Albumin/Globulin Ratio: 1.6 (ref 1.2–2.2)
Albumin: 4.5 g/dL (ref 3.8–4.9)
Alkaline Phosphatase: 67 IU/L (ref 39–117)
BUN/Creatinine Ratio: 14 (ref 9–23)
BUN: 15 mg/dL (ref 6–24)
Bilirubin Total: 0.5 mg/dL (ref 0.0–1.2)
CO2: 21 mmol/L (ref 20–29)
Calcium: 10.6 mg/dL — ABNORMAL HIGH (ref 8.7–10.2)
Chloride: 96 mmol/L (ref 96–106)
Creatinine, Ser: 1.05 mg/dL — ABNORMAL HIGH (ref 0.57–1.00)
GFR calc Af Amer: 71 mL/min/{1.73_m2} (ref >59)
GFR calc non Af Amer: 61 mL/min/{1.73_m2} (ref >59)
Globulin, Total: 2.9 g/dL (ref 1.5–4.5)
Glucose: 247 mg/dL — ABNORMAL HIGH (ref 65–99)
Potassium: 4.1 mmol/L (ref 3.5–5.2)
Sodium: 137 mmol/L (ref 134–144)
Total Protein: 7.4 g/dL (ref 6.0–8.5)

## 2018-12-05 LAB — LIPID PANEL
Chol/HDL Ratio: 4 ratio (ref 0.0–4.4)
Cholesterol, Total: 166 mg/dL (ref 100–199)
HDL: 42 mg/dL (ref >39)
LDL Calculated: 80 mg/dL (ref 0–99)
Triglycerides: 222 mg/dL — ABNORMAL HIGH (ref 0–149)
VLDL Cholesterol Cal: 44 mg/dL — ABNORMAL HIGH (ref 5–40)

## 2018-12-06 ENCOUNTER — Telehealth: Payer: Self-pay | Admitting: Internal Medicine

## 2018-12-06 DIAGNOSIS — E785 Hyperlipidemia, unspecified: Secondary | ICD-10-CM

## 2018-12-06 MED ORDER — LANTUS SOLOSTAR 100 UNIT/ML ~~LOC~~ SOPN: 10.0000 [IU] | PEN_INJECTOR | Freq: Every day | SUBCUTANEOUS | 6 refills | Status: DC

## 2018-12-06 MED ORDER — ROSUVASTATIN CALCIUM 20 MG PO TABS: 20.0000 mg | ORAL_TABLET | Freq: Every day | ORAL | 2 refills | Status: DC

## 2018-12-06 MED ORDER — INSULIN PEN NEEDLE 29G X 12MM MISC: 6 refills | Status: DC

## 2018-12-06 NOTE — Telephone Encounter (Signed)
PC placed to Sarah [name] today to discuss lab results.  A1C is 10.8 with goal being less than 7.  Sarah [name] confirms compliance with Metformin and Amaryl.  I recommend starting Lantus insulin 10 units at bedtime.  Sarah [name] reports being on insulin pen before and knows how to administer the shots.   LDL is 80 with goal being less than 70. She confirms compliance with Crestor.   I recommend increasing Crestor to 20 mg daily. Calcium level is elev.  She is not taking any vit D or calcium supplement.  She is up to date with age appropriate cancer screens. I told Sarah [name] this is likely due to HCTZ but I would like to check thyroid and parathyroid hormone levels to r/o over function of these glands as cause.  Sarah [name] agreeable to returning to lab to have done.  Results for orders placed or performed in visit on 12/04/18  Lipid panel  Result Value Ref Range   Cholesterol, Total 166 100 - 199 mg/dL   Triglycerides 222 (H) 0 - 149 mg/dL   HDL 42 >39 mg/dL   VLDL Cholesterol Cal 44 (H) 5 - 40 mg/dL   LDL Calculated 80 0 - 99 mg/dL   Chol/HDL Ratio 4.0 0.0 - 4.4 ratio  CBC  Result Value Ref Range   WBC 7.7 3.4 - 10.8 x10E3/uL   RBC 4.67 3.77 - 5.28 x10E6/uL   Hemoglobin 13.6 11.1 - 15.9 g/dL   Hematocrit 41.0 34.0 - 46.6 %   MCV 88 79 - 97 fL   MCH 29.1 26.6 - 33.0 pg   MCHC 33.2 31.5 - 35.7 g/dL   RDW 13.6 11.7 - 15.4 %   Platelets 332 150 - 450 x10E3/uL  Comprehensive metabolic panel  Result Value Ref Range   Glucose 247 (H) 65 - 99 mg/dL   BUN 15 6 - 24 mg/dL   Creatinine, Ser 1.05 (H) 0.57 - 1.00 mg/dL   GFR calc non Af Amer 61 >59 mL/min/1.73   GFR calc Af Amer 71 >59 mL/min/1.73   BUN/Creatinine Ratio 14 9 - 23   Sodium 137 134 - 144 mmol/L   Potassium 4.1 3.5 - 5.2 mmol/L   Chloride 96 96 - 106 mmol/L   CO2 21 20 - 29 mmol/L   Calcium 10.6 (H) 8.7 - 10.2 mg/dL   Total Protein 7.4 6.0 - 8.5 g/dL   Albumin 4.5 3.8 - 4.9 g/dL   Globulin, Total 2.9 1.5 - 4.5 g/dL   Albumin/Globulin Ratio 1.6 1.2 - 2.2   Bilirubin  Total 0.5 0.0 - 1.2 mg/dL   Alkaline Phosphatase 67 39 - 117 IU/L   AST 33 0 - 40 IU/L   ALT 28 0 - 32 IU/L  Hemoglobin A1c  Result Value Ref Range   Hgb A1c MFr Bld 10.8 (H) 4.8 - 5.6 %   Est. average glucose Bld gHb Est-mCnc 263 mg/dL

## 2018-12-07 ENCOUNTER — Other Ambulatory Visit: Payer: Self-pay | Admitting: Pharmacist

## 2018-12-07 DIAGNOSIS — I10 Essential (primary) hypertension: Secondary | ICD-10-CM

## 2018-12-07 MED ORDER — LOSARTAN POTASSIUM 50 MG PO TABS: 50.0000 mg | ORAL_TABLET | Freq: Every day | ORAL | 0 refills | Status: DC

## 2018-12-08 ENCOUNTER — Telehealth: Payer: Self-pay | Admitting: *Deleted

## 2018-12-08 DIAGNOSIS — M546 Pain in thoracic spine: Secondary | ICD-10-CM

## 2018-12-08 DIAGNOSIS — M545 Low back pain, unspecified: Secondary | ICD-10-CM

## 2018-12-08 DIAGNOSIS — G8929 Other chronic pain: Secondary | ICD-10-CM

## 2018-12-08 NOTE — Telephone Encounter (Signed)
Patient verified DOB Patient is aware of no arthritis or degenerative disc disease being noted. Patient is aware of PT referral being placed if she would like to see if this helps. Patient obliged. Patient advised to await a phone call from PT over the next 2 weeks for scheduling.

## 2018-12-08 NOTE — Telephone Encounter (Signed)
-----   Message from Ladell Pier, MD sent at 12/04/2018  4:39 PM EDT ----- Let pt know that the x-rays of her back came back okay without signs of arthritis or degenerative disc.  I recommend referral for some physical therapy to see if it will help.  Let me know if she wants to do it so that I can submit the referral.

## 2018-12-09 NOTE — Telephone Encounter (Signed)
Order placed for PT 

## 2018-12-17 ENCOUNTER — Ambulatory Visit: Payer: Medicare Other | Attending: Internal Medicine | Admitting: Physical Therapy

## 2018-12-17 ENCOUNTER — Encounter: Payer: Self-pay | Admitting: Physical Therapy

## 2018-12-17 ENCOUNTER — Other Ambulatory Visit: Payer: Self-pay

## 2018-12-17 DIAGNOSIS — M545 Low back pain: Secondary | ICD-10-CM | POA: Insufficient documentation

## 2018-12-17 DIAGNOSIS — R29898 Other symptoms and signs involving the musculoskeletal system: Secondary | ICD-10-CM

## 2018-12-17 DIAGNOSIS — M546 Pain in thoracic spine: Secondary | ICD-10-CM | POA: Insufficient documentation

## 2018-12-17 DIAGNOSIS — G8929 Other chronic pain: Secondary | ICD-10-CM

## 2018-12-17 NOTE — Therapy (Signed)
[place], [place], [address] Phone: [phone]   Fax:  [phone]  Physical Therapy Evaluation  Patient Details  Name: Sarah [name] MRN: [id number] Date of Birth: 03-Dec-1965 Referring Provider (PT): Dr. Karle Plumber    Encounter Date: 12/17/2018  PT End of Session - 12/17/18 1109    Visit Number  1    Number of Visits  12    Date for PT Re-Evaluation  02/11/19    Authorization Type  mcr, mcd    Authorization Time Period  PN visit 10    PT Start Time  1030    PT Stop Time  1122    PT Time Calculation (min)  52 min    Activity Tolerance  Patient tolerated treatment well    Behavior During Therapy  [facility] for tasks assessed/performed       Past Medical History:  Diagnosis Date  . Arthritis   . Family history of colon cancer   . Glaucoma 02/16  . Glucagonoma 07/28/14   Pt denies this but states she has glaucoma  . History of chemotherapy   . Hypertension   . Imbalance   . Neuropathy    feet bilat  . Nocturia   . Peritoneal carcinomatosis ([place])    carcinoma of ovary   . Shortness of breath dyspnea    hx of 2013 - no problems currently   . Thyroid nodule    history of   . Wears glasses     Past Surgical History:  Procedure Laterality Date  . ABDOMINAL HYSTERECTOMY    . FOOT SURGERY  04/2018   [facility]  . INCISIONAL HERNIA REPAIR N/A 07/06/2015   Procedure: INCISIONAL HERNIA REPAIR ;  Surgeon: Rolm Bookbinder, MD;  Location: [facility];  Service: General;  Laterality: N/A;  . INSERTION OF MESH N/A 07/06/2015   Procedure: WITH INSERTION OF PHASIX ST MESH;  Surgeon: Rolm Bookbinder, MD;  Location: [facility];  Service: General;  Laterality: N/A;  . LAPAROTOMY N/A 07/06/2015   Procedure: EXPLORATORY LAPAROTOMY;  Surgeon: Everitt Amber, MD;  [facility];  Service: Gynecology;  Laterality: N/A;  . LYSIS OF ADHESION N/A 07/06/2015   Procedure:  LYSIS OF ADHESION RESECTION OF MESENTERIC MASS  BOWEL RESECTION ;  Surgeon: Everitt Amber, MD;  [facility];  Service: Gynecology;  Laterality: N/A;  . ROBOTIC ASSISTED TOTAL HYSTERECTOMY WITH BILATERAL SALPINGO OOPHERECTOMY Bilateral 07/28/2014   Procedure: ROBOTIC ASSISTED lysis of adhesions with biopsies, converted to LAPAROTOMY, bilateral salpingoorphorectomy, omentectomy,appendectomy;  Surgeon: Janie Morning, MD;  [facility];  Service: Gynecology;  Laterality: Bilateral;  . THYROIDECTOMY, PARTIAL      There were no vitals filed for this visit.   Subjective Assessment - 12/17/18 1037    Subjective  Patient has had back pain since 2016.  She says her pain has gotten worse over the years.  She has to take breaks when she stands, cook. She denies LE pain, rare weakness in LEs.    Pertinent History  Ovarian CA,  ventral hernia, bunion surgery 11/19, [place]    Limitations  Standing;Lifting;House hold activities;Other (comment)   sleep   How long can you stand comfortably?  5 min    How long can you walk comfortably?  30 min is what she did this AM, was OK    Patient Stated Goals  Pt wants to have less pain    Currently in Pain?  Yes    Pain Score  8  Pain Location  Back    Pain Orientation  Mid;Lower    Pain Descriptors / Indicators  Sharp;Burning    Pain Type  Chronic pain    Pain Onset  More than a month ago    Pain Frequency  Constant    Aggravating Factors   stand still    Pain Relieving Factors  sitting, resting, Tylenol, heat    Effect of Pain on Daily Activities  limits her communtity activity (church), housework , sleep    Multiple Pain Sites  No         [facility] PT Assessment - 12/17/18 0001      Assessment   Medical Diagnosis  chronic low back and mid back pain     Referring Provider (PT)  Dr. Karle Plumber     Onset Date/Surgical Date  --   chronic    Prior Therapy  long ago for another issue      Precautions   Precautions  None    Precaution Comments  no lifting     Required Braces or Orthoses  --    wears a binder      Restrictions   Weight Bearing Restrictions  No      Balance Screen   Has the patient fallen in the past 6 months  No      [place] residence    Living Arrangements  Children;Parent    Type of [place] Access  Level entry    Home Layout  One level    Additional Comments  son has an apt and it is hard for her walk up the steps       Prior Function   Level of Independence  Independent    Vocation  On disability    Leisure  cooking, walking       Cognition   Overall Cognitive Status  Within Functional Limits for tasks assessed      Observation/Other Assessments   Focus on Therapeutic Outcomes (FOTO)   62%      Sensation   Light Touch  Impaired by gross assessment    Additional Comments  toes due to neuropathy       Squat   Comments  min pain       Single Leg Stance   Comments  < 10 sec bilateral       Posture/Postural Control   Posture/Postural Control  Postural limitations      AROM   Lumbar Flexion  WFL min pain     Lumbar Extension  25% pain     Lumbar - Right Side Bend  pain on R     Lumbar - Left Side Bend  pain on L     Lumbar - Right Rotation  WFL    Lumbar - Left Rotation  [facility]       Strength   Right Hip Flexion  3+/5    Right Hip ABduction  4+/5    Left Hip Flexion  3+/5    Left Hip ABduction  4+/5    Right Knee Flexion  4+/5    Right Knee Extension  5/5    Left Knee Flexion  4+/5    Left Knee Extension  5/5      Palpation   Palpation comment  grossly sore along bilateral thoracic and lumbar parapsinals and superiod gluteals       Special Tests   Other special tests  Neg SLR       Transfers   Five time sit to stand comments   15 sec        Objective measurements completed on examination: See above findings.       PT Education - 12/17/18 1108    Education Details  PT, POC, HEP, anatomy    Person(s) Educated  Patient    Methods  Explanation;Handout    Comprehension   Verbalized understanding;Returned demonstration;Tactile cues required;Verbal cues required       PT Short Term Goals - 12/17/18 1250      PT SHORT TERM GOAL #1   Title  Pt will be I with initial HEP    Time  3    Period  Weeks    Status  New    Target Date  01/07/19      PT SHORT TERM GOAL #2   Title  Pt will report less pain overall with ADLs, improved to no more than 6/10 most of the time.    Time  4    Period  Weeks    Status  New    Target Date  01/14/19        PT Long Term Goals - 12/17/18 1247      PT LONG TERM GOAL #1   Title  Pt will score 46% or less on FOTO to demonstrate improved functional mobility    Time  6    Period  Weeks    Status  New    Target Date  02/11/19   allow 8 weeks     PT LONG TERM GOAL #2   Title  Pt will be able to stand to wash dishes, cook for 20 min at a time with min increase pain in back (no more than 5/10)    Time  6    Period  Weeks    Status  New    Target Date  02/11/19      PT LONG TERM GOAL #3   Title  Pt will demo proper body mechanics for transfers, sitting and standing    Time  6    Period  Weeks    Status  New    Target Date  02/11/19      PT LONG TERM GOAL #4   Title  pt will be able to walk 2 miles in 25 min on the track to show increased stamina, endurance    Time  6    Period  Weeks    Status  New    Target Date  02/11/19             Plan - 12/17/18 1110    Clinical Impression Statement  Patient presents for low complexity eval of chronic ongoing low back pain which interefers with ADLs, home tasks and functional mobility.  She has decent strength in LEs but has a ventral hernia which may be repaired in the coming weeks.  Her abdominal strength is very poor a a result.  She will benefit from PT to provide education and guidance regarding posture, corrective exercises and body mechanics to preserve her back.    Personal Factors and Comorbidities  Time since onset of injury/illness/exacerbation;Past/Current  Experience;Comorbidity 1;Comorbidity 3+    Examination-Activity Limitations  Bathing;Hygiene/Grooming;Squat;Stand;Locomotion Level;Bend;Transfers;Sleep    Examination-Participation Restrictions  Church;Laundry;Cleaning;Meal Prep    Stability/Clinical Decision Making  Stable/Uncomplicated    Clinical Decision Making  Low    Rehab Potential  Excellent    PT Frequency  2x / week    PT Duration  6 weeks    PT Treatment/Interventions  ADLs/Self Care Home Management;Electrical Stimulation;Patient/family education;Therapeutic activities;Taping;Therapeutic exercise;Moist Heat;Functional mobility training;Ultrasound;Manual techniques;Dry needling    PT Next Visit Plan  check HEP, Nustep, posture, progress lower abs, core and spend time on posterior pelvic tilt esp, no prone due to hernia    PT Home Exercise Plan  LTR< hamstrgin stretch, knee to chest and PPT       Patient will benefit from skilled therapeutic intervention in order to improve the following deficits and impairments:  Pain, Improper body mechanics, Impaired sensation, Decreased mobility, Postural dysfunction, Obesity, Impaired flexibility, Decreased endurance, Decreased range of motion, Decreased strength  Visit Diagnosis: 1. Chronic bilateral low back pain without sciatica   2. Pain in thoracic spine   3. Other symptoms and signs involving the musculoskeletal system        Problem List Patient Active Problem List   Diagnosis Date Noted  . Hiatal hernia with GERD without esophagitis 06/18/2017  . Need for influenza vaccination 02/17/2017  . Ventral hernia 01/24/2016  . Type 2 diabetes mellitus without complication, without long-term current use of insulin ([place]) 10/04/2015  . Obesity (BMI 30-39.9) 10/04/2015  . Dyslipidemia 07/27/2015  . Neuropathic pain of both legs 07/27/2015  . Obesity (BMI 30.0-34.9) 12/24/2014  . Genetic testing 11/01/2014  . Constipation - functional 09/24/2014  . Chemotherapy-induced peripheral  neuropathy ([place]) 09/24/2014  . Midline low back pain without sciatica 09/24/2014  . Family history of colon cancer   . Ovarian cancer, right ([place]) 07/08/2014  . Hilar adenopathy   . Essential hypertension   . Pulmonary nodule     PAA,JENNIFER 12/17/2018, 3:50 PM  [place], [place], [address] Phone: [phone]   Fax:  [phone]  Name: Tykiera Raven MRN: [id number] Date of Birth: 1965-09-27   Raeford Razor, PT 12/17/18 3:50 PM Phone: [phone] Fax: [phone]

## 2018-12-17 NOTE — Patient Instructions (Signed)
Prepared By: [place], [place]  Phone: [phone]  Step 1  Step 2  Hooklying Single Knee to Chest reps: 5  sets: 1  hold: 30  daily: 2  weekly: 7 Setup  Begin lying on your back with both legs bent and feet flat on the bed. Movement  Pull one knee toward your chest and hold. Tip  Make sure to keep your back relaxed during the exercise. Step 1  Step 2  Supine Lower Trunk Rotation reps: 10  sets: 1  hold: 30  daily: 2  weekly: 7 Setup  Begin lying on your back with your knees bent and feet resting on the floor. Movement  Keeping your back flat, slowly rotate your knees down towards the floor until you feel a stretch in your trunk and hold. Tip  Make sure that your back and shoulders stay in contact with the floor. Step 1  Step 2  Seated Hamstring Stretch reps: 3  sets: 1  hold: 30  daily: 2  weekly: 7 Setup  Begin sitting upright with one leg straight forward and your heel resting on the ground. Movement  Bend your trunk forward, hinging at your hips until you feel a stretch in the back of your leg. Hold this position.  Tip  Make sure to keep your knee straight during the stretch and do not let your back arch or slump. Step 1  Step 2  Supine Posterior Pelvic Tilt reps: 10  sets: 1  hold: 10  daily: 2  weekly: 7 Setup  Begin by lying on your back with your knees bent and feet resting on the floor.  Movement  Slowly bend your low back and tilt your pelvis backward into the floor, then return to the starting position and repeat.  Tip  Make sure to only move your pelvis and low back and keep the rest of your body relaxed.

## 2018-12-28 ENCOUNTER — Ambulatory Visit: Payer: Medicare Other | Admitting: Physical Therapy

## 2018-12-28 ENCOUNTER — Other Ambulatory Visit: Payer: Self-pay

## 2018-12-28 ENCOUNTER — Encounter: Payer: Self-pay | Admitting: Physical Therapy

## 2018-12-28 DIAGNOSIS — M546 Pain in thoracic spine: Secondary | ICD-10-CM | POA: Diagnosis not present

## 2018-12-28 DIAGNOSIS — M545 Low back pain, unspecified: Secondary | ICD-10-CM

## 2018-12-28 DIAGNOSIS — R29898 Other symptoms and signs involving the musculoskeletal system: Secondary | ICD-10-CM

## 2018-12-28 DIAGNOSIS — G8929 Other chronic pain: Secondary | ICD-10-CM

## 2018-12-28 NOTE — Therapy (Signed)
[place], [place], [address] Phone: [phone]   Fax:  [phone]  Physical Therapy Treatment  Patient Details  Name: Sarah [name] MRN: [id number] Date of Birth: March 17, 1966 Referring Provider (PT): Dr. Karle Plumber    Encounter Date: 12/28/2018  PT End of Session - 12/28/18 1003    Visit Number  2    Number of Visits  12    Date for PT Re-Evaluation  02/11/19    Authorization Type  mcr, mcd    Authorization Time Period  PN visit 10    PT Start Time  1000    PT Stop Time  1039    PT Time Calculation (min)  39 min       Past Medical History:  Diagnosis Date  . Arthritis   . Family history of colon cancer   . Glaucoma 02/16  . Glucagonoma 07/28/14   Pt denies this but states she has glaucoma  . History of chemotherapy   . Hypertension   . Imbalance   . Neuropathy    feet bilat  . Nocturia   . Peritoneal carcinomatosis ([place])    carcinoma of ovary   . Shortness of breath dyspnea    hx of 2013 - no problems currently   . Thyroid nodule    history of   . Wears glasses     Past Surgical History:  Procedure Laterality Date  . ABDOMINAL HYSTERECTOMY    . FOOT SURGERY  04/2018   [facility]  . INCISIONAL HERNIA REPAIR N/A 07/06/2015   Procedure: INCISIONAL HERNIA REPAIR ;  Surgeon: Rolm Bookbinder, MD;  [facility];  Service: General;  Laterality: N/A;  . INSERTION OF MESH N/A 07/06/2015   Procedure: WITH INSERTION OF PHASIX ST MESH;  Surgeon: Rolm Bookbinder, MD;  Location[facility];  Service: General;  Laterality: N/A;  . LAPAROTOMY N/A 07/06/2015   Procedure: EXPLORATORY LAPAROTOMY;  Surgeon: Everitt Amber, MD;  [facility]: [facility];  Service: Gynecology;  Laterality: N/A;  . LYSIS OF ADHESION N/A 07/06/2015   Procedure:  LYSIS OF ADHESION RESECTION OF MESENTERIC MASS BOWEL RESECTION ;  Surgeon: Everitt Amber, MD;  [facility];  Service: Gynecology;  Laterality: N/A;  . ROBOTIC  ASSISTED TOTAL HYSTERECTOMY WITH BILATERAL SALPINGO OOPHERECTOMY Bilateral 07/28/2014   Procedure: ROBOTIC ASSISTED lysis of adhesions with biopsies, converted to LAPAROTOMY, bilateral salpingoorphorectomy, omentectomy,appendectomy;  Surgeon: Janie Morning, MD;  [facility];  Service: Gynecology;  Laterality: Bilateral;  . THYROIDECTOMY, PARTIAL      There were no vitals filed for this visit.  Subjective Assessment - 12/28/18 1002    Subjective  Back is doing fairly well today.    Currently in Pain?  Yes    Pain Score  1     Pain Location  Back    Pain Orientation  Lower;Mid    Pain Descriptors / Indicators  Sore    Aggravating Factors   standing, bending    Pain Relieving Factors  sitting, rest, tylenol , heat                       [facility] Adult PT Treatment/Exercise - 12/28/18 0001      Lumbar Exercises: Stretches   Active Hamstring Stretch  3 reps;30 seconds    Single Knee to Chest Stretch  3 reps;30 seconds    Lower Trunk Rotation  10 seconds    Lower Trunk Rotation Limitations  10 reps  Lumbar Exercises: Aerobic   Nustep  L5 UE/LE x 6 minutes       Lumbar Exercises: Supine   Ab Set  5 reps    Pelvic Tilt  20 reps    Clam  10 reps    Clam Limitations  single and bilat bent knee fall outs with abdominal draw in, cues for breathing     Bent Knee Raise  10 reps    Bent Knee Raise Limitations  reported lumbar burning     Bridge  10 reps             PT Education - 12/28/18 1035    Education Details  HEP    Person(s) Educated  Patient    Methods  Explanation;Handout    Comprehension  Verbalized understanding       PT Short Term Goals - 12/17/18 1250      PT SHORT TERM GOAL #1   Title  Pt will be I with initial HEP    Time  3    Period  Weeks    Status  New    Target Date  01/07/19      PT SHORT TERM GOAL #2   Title  Pt will report less pain overall with ADLs, improved to no more than 6/10 most of the time.    Time  4    Period   Weeks    Status  New    Target Date  01/14/19        PT Long Term Goals - 12/17/18 1247      PT LONG TERM GOAL #1   Title  Pt will score 46% or less on FOTO to demonstrate improved functional mobility    Time  6    Period  Weeks    Status  New    Target Date  02/11/19   allow 8 weeks     PT LONG TERM GOAL #2   Title  Pt will be able to stand to wash dishes, cook for 20 min at a time with min increase pain in back (no more than 5/10)    Time  6    Period  Weeks    Status  New    Target Date  02/11/19      PT LONG TERM GOAL #3   Title  Pt will demo proper body mechanics for transfers, sitting and standing    Time  6    Period  Weeks    Status  New    Target Date  02/11/19      PT LONG TERM GOAL #4   Title  pt will be able to walk 2 miles in 25 min on the track to show increased stamina, endurance    Time  6    Period  Weeks    Status  New    Target Date  02/11/19            Plan - 12/28/18 1005    Clinical Impression Statement  Pt reports she walks 3 times per week to build her stamina. Reviewed HEP and progressed core. Some lumbar burning with supine marching. Updated HEP. After session she reported feeling like she had exercised.    PT Next Visit Plan  check HEP, Nustep, posture, progress lower abs, core and spend time on posterior pelvic tilt esp, no prone due to hernia    PT Home Exercise Plan  LTR< hamstrgin stretch, knee to chest and PPT, bent knee fall  outs, bridge       Patient will benefit from skilled therapeutic intervention in order to improve the following deficits and impairments:  Pain, Improper body mechanics, Impaired sensation, Decreased mobility, Postural dysfunction, Obesity, Impaired flexibility, Decreased endurance, Decreased range of motion, Decreased strength  Visit Diagnosis: 1. Chronic bilateral low back pain without sciatica   2. Other symptoms and signs involving the musculoskeletal system   3. Pain in thoracic spine         Problem List Patient Active Problem List   Diagnosis Date Noted  . Hiatal hernia with GERD without esophagitis 06/18/2017  . Need for influenza vaccination 02/17/2017  . Ventral hernia 01/24/2016  . Type 2 diabetes mellitus without complication, without long-term current use of insulin ([place]) 10/04/2015  . Obesity (BMI 30-39.9) 10/04/2015  . Dyslipidemia 07/27/2015  . Neuropathic pain of both legs 07/27/2015  . Obesity (BMI 30.0-34.9) 12/24/2014  . Genetic testing 11/01/2014  . Constipation - functional 09/24/2014  . Chemotherapy-induced peripheral neuropathy ([place]) 09/24/2014  . Midline low back pain without sciatica 09/24/2014  . Family history of colon cancer   . Ovarian cancer, right ([place]) 07/08/2014  . Hilar adenopathy   . Essential hypertension   . Pulmonary nodule     Dorene Ar, PTA 12/28/2018, 10:41 AM  [facility] [address] [place], [place], [address] Phone: [phone]   Fax:  [phone]  Name: Sarah [name] MRN: [id number] Date of Birth: 1966/03/15

## 2018-12-31 ENCOUNTER — Ambulatory Visit: Payer: Medicare Other | Admitting: Physical Therapy

## 2018-12-31 ENCOUNTER — Other Ambulatory Visit: Payer: Self-pay

## 2018-12-31 ENCOUNTER — Encounter: Payer: Self-pay | Admitting: Physical Therapy

## 2018-12-31 DIAGNOSIS — R29898 Other symptoms and signs involving the musculoskeletal system: Secondary | ICD-10-CM

## 2018-12-31 DIAGNOSIS — M545 Low back pain: Secondary | ICD-10-CM | POA: Diagnosis not present

## 2018-12-31 DIAGNOSIS — M546 Pain in thoracic spine: Secondary | ICD-10-CM

## 2018-12-31 DIAGNOSIS — G8929 Other chronic pain: Secondary | ICD-10-CM

## 2018-12-31 NOTE — Therapy (Signed)
[place], [place], [address] Phone: [phone]   Fax:  [phone]  Physical Therapy Treatment  Patient Details  Name: Sarah [name] MRN: [id number] Date of Birth: 1966/03/12 Referring Provider (PT): Dr. Karle Plumber    Encounter Date: 12/31/2018  PT End of Session - 12/31/18 0937    Visit Number  3    Number of Visits  12    Date for PT Re-Evaluation  02/11/19    Authorization Type  mcr, mcd    Authorization Time Period  PN visit 10    PT Start Time  0933    PT Stop Time  1011    PT Time Calculation (min)  38 min    Activity Tolerance  Patient tolerated treatment well    Behavior During Therapy  [facility] for tasks assessed/performed       Past Medical History:  Diagnosis Date  . Arthritis   . Family history of colon cancer   . Glaucoma 02/16  . Glucagonoma 07/28/14   Pt denies this but states she has glaucoma  . History of chemotherapy   . Hypertension   . Imbalance   . Neuropathy    feet bilat  . Nocturia   . Peritoneal carcinomatosis ([place])    carcinoma of ovary   . Shortness of breath dyspnea    hx of 2013 - no problems currently   . Thyroid nodule    history of   . Wears glasses     Past Surgical History:  Procedure Laterality Date  . ABDOMINAL HYSTERECTOMY    . FOOT SURGERY  04/2018   [facility]  . INCISIONAL HERNIA REPAIR N/A 07/06/2015   Procedure: INCISIONAL HERNIA REPAIR ;  Surgeon: Rolm Bookbinder, MD;  [facility];  Service: General;  Laterality: N/A;  . INSERTION OF MESH N/A 07/06/2015   Procedure: WITH INSERTION OF PHASIX ST MESH;  Surgeon: Rolm Bookbinder, MD;  [facility];  Service: General;  Laterality: N/A;  . LAPAROTOMY N/A 07/06/2015   Procedure: EXPLORATORY LAPAROTOMY;  Surgeon: Everitt Amber, MD;  [facility];  Service: Gynecology;  Laterality: N/A;  . LYSIS OF ADHESION N/A 07/06/2015   Procedure:  LYSIS OF ADHESION RESECTION OF MESENTERIC MASS  BOWEL RESECTION ;  Surgeon: Everitt Amber, MD;  [facility];  Service: Gynecology;  Laterality: N/A;  . ROBOTIC ASSISTED TOTAL HYSTERECTOMY WITH BILATERAL SALPINGO OOPHERECTOMY Bilateral 07/28/2014   Procedure: ROBOTIC ASSISTED lysis of adhesions with biopsies, converted to LAPAROTOMY, bilateral salpingoorphorectomy, omentectomy,appendectomy;  Surgeon: Janie Morning, MD;  [facility];  Service: Gynecology;  Laterality: Bilateral;  . THYROIDECTOMY, PARTIAL      There were no vitals filed for this visit.  Subjective Assessment - 12/31/18 0935    Subjective  I walked this morning so I am tired. My legs are hurting, more in the Left hip    Patient Stated Goals  Pt wants to have less pain    Currently in Pain?  Yes    Pain Score  5     Pain Location  Hip    Pain Orientation  Left    Pain Descriptors / Indicators  Sore                       [facility] Adult PT Treatment/Exercise - 12/31/18 0001      Exercises   Exercises  Lumbar      Lumbar Exercises: Stretches   Passive Hamstring Stretch Limitations  seated EOB    Piriformis Stretch Limitations  figure 4 pull knee    Gastroc Stretch Limitations  slant board      Lumbar Exercises: Aerobic   Nustep  L5 UE & LE 5 min      Lumbar Exercises: Standing   Other Standing Lumbar Exercises  gait training- trunk rotation and arm swing      Lumbar Exercises: Supine   AB Set Limitations  core engagement with breathing    Bridge with [employer]  15 reps    Bridge with [employer] Limitations  with cues for ab set    Other Supine Lumbar Exercises  ab set with ball squeeze      Manual Therapy   Manual Therapy  Soft tissue mobilization    Soft tissue mobilization  Lt QL, TFL, glut med in sidelying               PT Short Term Goals - 12/17/18 1250      PT SHORT TERM GOAL #1   Title  Pt will be I with initial HEP    Time  3    Period  Weeks    Status  New    Target Date  01/07/19      PT SHORT TERM GOAL #2    Title  Pt will report less pain overall with ADLs, improved to no more than 6/10 most of the time.    Time  4    Period  Weeks    Status  New    Target Date  01/14/19        PT Long Term Goals - 12/17/18 1247      PT LONG TERM GOAL #1   Title  Pt will score 46% or less on FOTO to demonstrate improved functional mobility    Time  6    Period  Weeks    Status  New    Target Date  02/11/19   allow 8 weeks     PT LONG TERM GOAL #2   Title  Pt will be able to stand to wash dishes, cook for 20 min at a time with min increase pain in back (no more than 5/10)    Time  6    Period  Weeks    Status  New    Target Date  02/11/19      PT LONG TERM GOAL #3   Title  Pt will demo proper body mechanics for transfers, sitting and standing    Time  6    Period  Weeks    Status  New    Target Date  02/11/19      PT LONG TERM GOAL #4   Title  pt will be able to walk 2 miles in 25 min on the track to show increased stamina, endurance    Time  6    Period  Weeks    Status  New    Target Date  02/11/19            Plan - 12/31/18 1019    Clinical Impression Statement  Focused on proper activation of core in current HEP exercises as she was over contracting resulting in poor mechanics. Encouraged her to ambulate with large arm swings and do side steps on straight-aways of track.    PT Treatment/Interventions  ADLs/Self Care Home Management;Electrical Stimulation;Patient/family education;Therapeutic activities;Taping;Therapeutic exercise;Moist Heat;Functional mobility training;Ultrasound;Manual techniques;Dry needling    PT Next Visit Plan  progress core  activities, no prone    PT Home Exercise Plan  LTR< hamstrgin stretch, knee to chest and PPT, bent knee fall outs, bridge    Consulted and Agree with Plan of Care  Patient       Patient will benefit from skilled therapeutic intervention in order to improve the following deficits and impairments:  Pain, Improper body mechanics,  Impaired sensation, Decreased mobility, Postural dysfunction, Obesity, Impaired flexibility, Decreased endurance, Decreased range of motion, Decreased strength  Visit Diagnosis: 1. Chronic bilateral low back pain without sciatica   2. Other symptoms and signs involving the musculoskeletal system   3. Pain in thoracic spine        Problem List Patient Active Problem List   Diagnosis Date Noted  . Hiatal hernia with GERD without esophagitis 06/18/2017  . Need for influenza vaccination 02/17/2017  . Ventral hernia 01/24/2016  . Type 2 diabetes mellitus without complication, without long-term current use of insulin ([place]) 10/04/2015  . Obesity (BMI 30-39.9) 10/04/2015  . Dyslipidemia 07/27/2015  . Neuropathic pain of both legs 07/27/2015  . Obesity (BMI 30.0-34.9) 12/24/2014  . Genetic testing 11/01/2014  . Constipation - functional 09/24/2014  . Chemotherapy-induced peripheral neuropathy ([place]) 09/24/2014  . Midline low back pain without sciatica 09/24/2014  . Family history of colon cancer   . Ovarian cancer, right ([place]) 07/08/2014  . Hilar adenopathy   . Essential hypertension   . Pulmonary nodule    Field Staniszewski C. Virgilio Broadhead PT, DPT 12/31/18 10:21 AM   [place] [facility] [address] [place], [place], [address] Phone: [phone]   Fax:  [phone]  Name: Sarah [name] MRN: [id number] Date of Birth: Oct 20, 1965

## 2019-01-05 ENCOUNTER — Encounter: Payer: Self-pay | Admitting: Physical Therapy

## 2019-01-05 ENCOUNTER — Ambulatory Visit: Payer: Medicare Other | Admitting: Physical Therapy

## 2019-01-05 ENCOUNTER — Other Ambulatory Visit: Payer: Self-pay

## 2019-01-05 DIAGNOSIS — R29898 Other symptoms and signs involving the musculoskeletal system: Secondary | ICD-10-CM

## 2019-01-05 DIAGNOSIS — M546 Pain in thoracic spine: Secondary | ICD-10-CM

## 2019-01-05 DIAGNOSIS — G8929 Other chronic pain: Secondary | ICD-10-CM | POA: Diagnosis not present

## 2019-01-05 DIAGNOSIS — M545 Low back pain: Secondary | ICD-10-CM | POA: Diagnosis not present

## 2019-01-05 NOTE — Therapy (Signed)
[place], [place], [address] Phone: [phone]   Fax:  [phone]  Physical Therapy Treatment  Patient Details  Name: Sarah [name] MRN: [id number] Date of Birth: 1965-09-28 Referring Provider (PT): Dr. Karle Plumber    Encounter Date: 01/05/2019  PT End of Session - 01/05/19 0936    Visit Number  4    Number of Visits  12    Date for PT Re-Evaluation  02/11/19    Authorization Type  mcr, mcd    Authorization Time Period  PN visit 10    PT Start Time  0930    PT Stop Time  1015    PT Time Calculation (min)  45 min    Activity Tolerance  Patient tolerated treatment well    Behavior During Therapy  [facility] for tasks assessed/performed       Past Medical History:  Diagnosis Date  . Arthritis   . Family history of colon cancer   . Glaucoma 02/16  . Glucagonoma 07/28/14   Pt denies this but states she has glaucoma  . History of chemotherapy   . Hypertension   . Imbalance   . Neuropathy    feet bilat  . Nocturia   . Peritoneal carcinomatosis ([place])    carcinoma of ovary   . Shortness of breath dyspnea    hx of 2013 - no problems currently   . Thyroid nodule    history of   . Wears glasses     Past Surgical History:  Procedure Laterality Date  . ABDOMINAL HYSTERECTOMY    . FOOT SURGERY  04/2018   [facility]  . INCISIONAL HERNIA REPAIR N/A 07/06/2015   Procedure: INCISIONAL HERNIA REPAIR ;  Surgeon: Rolm Bookbinder, MD;  [facility];  Service: General;  Laterality: N/A;  . INSERTION OF MESH N/A 07/06/2015   Procedure: WITH INSERTION OF PHASIX ST MESH;  Surgeon: Rolm Bookbinder, MD;  [facility];  Service: General;  Laterality: N/A;  . LAPAROTOMY N/A 07/06/2015   Procedure: EXPLORATORY LAPAROTOMY;  Surgeon: Everitt Amber, MD;  [facility];  Service: Gynecology;  Laterality: N/A;  . LYSIS OF ADHESION N/A 07/06/2015   Procedure:  LYSIS OF ADHESION RESECTION OF MESENTERIC MASS  BOWEL RESECTION ;  Surgeon: Everitt Amber, MD;  [facility];  Service: Gynecology;  Laterality: N/A;  . ROBOTIC ASSISTED TOTAL HYSTERECTOMY WITH BILATERAL SALPINGO OOPHERECTOMY Bilateral 07/28/2014   Procedure: ROBOTIC ASSISTED lysis of adhesions with biopsies, converted to LAPAROTOMY, bilateral salpingoorphorectomy, omentectomy,appendectomy;  Surgeon: Janie Morning, MD;  [facility];  Service: Gynecology;  Laterality: Bilateral;  . THYROIDECTOMY, PARTIAL      There were no vitals filed for this visit.  Subjective Assessment - 01/05/19 0930    Subjective  Pt arriving to therapy reporting no pain at present, but reported 10/10 pain last night. Pt reporting she took tylenol and when she woke up this morning it was gone. Pt also reported that she hasn't walked this morning.    Pertinent History  Ovarian CA,  ventral hernia, bunion surgery 11/19, [place]    Limitations  Standing;Lifting;House hold activities;Other (comment)    How long can you stand comfortably?  5 min    How long can you walk comfortably?  30 min is what she did this AM, was OK    Patient Stated Goals  Pt wants to have less pain    Currently in Pain?  No/denies  [facility] Adult PT Treatment/Exercise - 01/05/19 0001      Lumbar Exercises: Stretches   Active Hamstring Stretch  Right;Left;3 reps;30 seconds    Piriformis Stretch Limitations  figure 4 pull knee      Lumbar Exercises: Aerobic   Nustep  L5 UE and LE, 8 minutes   at end of session     Lumbar Exercises: Seated   Other Seated Lumbar Exercises  seated hip flexion with core activation and postural correction    Other Seated Lumbar Exercises  clam shells with green theraband x 10, 2 sets      Lumbar Exercises: Supine   Ab Set  10 reps    AB Set Limitations  core engagement with breathing    Pelvic Tilt  10 reps    Bridge  15 reps;5 seconds      Modalities   Modalities  Moist Heat      Moist Heat Therapy   Number Minutes  Moist Heat  8 Minutes    Moist Heat Location  Lumbar Spine   while on Nustep     Manual Therapy   Soft tissue mobilization  IT band and L gute rolling in sidelying, left QL               PT Short Term Goals - 01/05/19 0941      PT SHORT TERM GOAL #1   Title  Pt will be I with initial HEP    Status  On-going      PT SHORT TERM GOAL #2   Title  Pt will report less pain overall with ADLs, improved to no more than 6/10 most of the time.    Status  On-going        PT Long Term Goals - 01/05/19 0942      PT LONG TERM GOAL #1   Title  Pt will score 46% or less on FOTO to demonstrate improved functional mobility    Time  6    Period  Weeks    Status  New      PT LONG TERM GOAL #2   Title  Pt will be able to stand to wash dishes, cook for 20 min at a time with min increase pain in back (no more than 5/10)    Time  6    Period  Weeks    Status  New      PT LONG TERM GOAL #3   Title  Pt will demo proper body mechanics for transfers, sitting and standing    Time  6    Period  Weeks    Status  New      PT LONG TERM GOAL #4   Title  pt will be able to walk 2 miles in 25 min on the track to show increased stamina, endurance    Time  6    Period  Weeks    Status  New            Plan - 01/05/19 9381    Clinical Impression Statement  Pt tolerating exercises well. Pt with mild pain reported during rotational exercises but reported no pain at end of session. Pt encouraged to continue her HEP and walking program with arm swing. Continue skilled PT.    Personal Factors and Comorbidities  Time since onset of injury/illness/exacerbation;Past/Current Experience;Comorbidity 1;Comorbidity 3+    Examination-Activity Limitations  Bathing;Hygiene/Grooming;Squat;Stand;Locomotion Level;Bend;Transfers;Sleep    Examination-Participation Restrictions  Church;Laundry;Cleaning;Meal Prep    Stability/Clinical Decision Making  Stable/Uncomplicated    Rehab Potential  Excellent    PT  Frequency  2x / week    PT Duration  6 weeks    PT Treatment/Interventions  ADLs/Self Care Home Management;Electrical Stimulation;Patient/family education;Therapeutic activities;Taping;Therapeutic exercise;Moist Heat;Functional mobility training;Ultrasound;Manual techniques;Dry needling    PT Next Visit Plan  progress core activities, no prone    PT Home Exercise Plan  LTR< hamstrgin stretch, knee to chest and PPT, bent knee fall outs, bridge       Patient will benefit from skilled therapeutic intervention in order to improve the following deficits and impairments:  Pain, Improper body mechanics, Impaired sensation, Decreased mobility, Postural dysfunction, Obesity, Impaired flexibility, Decreased endurance, Decreased range of motion, Decreased strength  Visit Diagnosis: 1. Other symptoms and signs involving the musculoskeletal system   2. Pain in thoracic spine        Problem List Patient Active Problem List   Diagnosis Date Noted  . Hiatal hernia with GERD without esophagitis 06/18/2017  . Need for influenza vaccination 02/17/2017  . Ventral hernia 01/24/2016  . Type 2 diabetes mellitus without complication, without long-term current use of insulin ([place]) 10/04/2015  . Obesity (BMI 30-39.9) 10/04/2015  . Dyslipidemia 07/27/2015  . Neuropathic pain of both legs 07/27/2015  . Obesity (BMI 30.0-34.9) 12/24/2014  . Genetic testing 11/01/2014  . Constipation - functional 09/24/2014  . Chemotherapy-induced peripheral neuropathy ([place]) 09/24/2014  . Midline low back pain without sciatica 09/24/2014  . Family history of colon cancer   . Ovarian cancer, right ([place]) 07/08/2014  . Hilar adenopathy   . Essential hypertension   . Pulmonary nodule     Oretha Caprice, PT 01/05/2019, 10:14 AM  [facility] [address] [place], [place], [address] Phone: [phone]   Fax:  [phone]  Name: Sarah [name] MRN: [id number] Date of  Birth: 1966/03/25

## 2019-01-07 ENCOUNTER — Other Ambulatory Visit: Payer: Self-pay

## 2019-01-07 ENCOUNTER — Ambulatory Visit: Payer: Medicare Other | Admitting: Physical Therapy

## 2019-01-07 ENCOUNTER — Encounter: Payer: Self-pay | Admitting: Physical Therapy

## 2019-01-07 DIAGNOSIS — R29898 Other symptoms and signs involving the musculoskeletal system: Secondary | ICD-10-CM

## 2019-01-07 DIAGNOSIS — M545 Low back pain: Secondary | ICD-10-CM | POA: Diagnosis not present

## 2019-01-07 DIAGNOSIS — G8929 Other chronic pain: Secondary | ICD-10-CM | POA: Diagnosis not present

## 2019-01-07 DIAGNOSIS — M546 Pain in thoracic spine: Secondary | ICD-10-CM

## 2019-01-07 NOTE — Therapy (Signed)
[place], [place], [address] Phone: [phone]   Fax:  [phone]  Physical Therapy Treatment  Patient Details  Name: Sarah [name] MRN: [id number] Date of Birth: 1965/08/01 Referring Provider (PT): Dr. Karle Plumber    Encounter Date: 01/07/2019  PT End of Session - 01/07/19 0955    Visit Number  5    Number of Visits  12    Date for PT Re-Evaluation  02/11/19    Authorization Type  mcr, mcd    Authorization Time Period  PN visit 10    PT Start Time  0945    PT Stop Time  1025    PT Time Calculation (min)  40 min    Activity Tolerance  Patient tolerated treatment well    Behavior During Therapy  [facility] for tasks assessed/performed       Past Medical History:  Diagnosis Date  . Arthritis   . Family history of colon cancer   . Glaucoma 02/16  . Glucagonoma 07/28/14   Pt denies this but states she has glaucoma  . History of chemotherapy   . Hypertension   . Imbalance   . Neuropathy    feet bilat  . Nocturia   . Peritoneal carcinomatosis ([place])    carcinoma of ovary   . Shortness of breath dyspnea    hx of 2013 - no problems currently   . Thyroid nodule    history of   . Wears glasses     Past Surgical History:  Procedure Laterality Date  . ABDOMINAL HYSTERECTOMY    . FOOT SURGERY  04/2018   [facility]  . INCISIONAL HERNIA REPAIR N/A 07/06/2015   Procedure: INCISIONAL HERNIA REPAIR ;  Surgeon: Rolm Bookbinder, MD;  [facility];  Service: General;  Laterality: N/A;  . INSERTION OF MESH N/A 07/06/2015   Procedure: WITH INSERTION OF PHASIX ST MESH;  Surgeon: Rolm Bookbinder, MD;  [facility];  Service: General;  Laterality: N/A;  . LAPAROTOMY N/A 07/06/2015   Procedure: EXPLORATORY LAPAROTOMY;  Surgeon: Everitt Amber, MD;  [facility];  Service: Gynecology;  Laterality: N/A;  . LYSIS OF ADHESION N/A 07/06/2015   Procedure:  LYSIS OF ADHESION RESECTION OF MESENTERIC MASS  BOWEL RESECTION ;  Surgeon: Everitt Amber, MD;  [facility];  Service: Gynecology;  Laterality: N/A;  . ROBOTIC ASSISTED TOTAL HYSTERECTOMY WITH BILATERAL SALPINGO OOPHERECTOMY Bilateral 07/28/2014   Procedure: ROBOTIC ASSISTED lysis of adhesions with biopsies, converted to LAPAROTOMY, bilateral salpingoorphorectomy, omentectomy,appendectomy;  Surgeon: Janie Morning, MD;  [facility];  Service: Gynecology;  Laterality: Bilateral;  . THYROIDECTOMY, PARTIAL      There were no vitals filed for this visit.  Subjective Assessment - 01/07/19 0954    Subjective  A little pain when I was doing dishes but not bad. Just finished walking    Currently in Pain?  No/denies                       [facility] Adult PT Treatment/Exercise - 01/07/19 0001      Exercises   Exercises  Knee/Hip      Lumbar Exercises: Stretches   Passive Hamstring Stretch  Right;Left;30 seconds      Lumbar Exercises: Standing   Other Standing Lumbar Exercises  dead lift 10 lb kettle bell    Other Standing Lumbar Exercises  bent forward row 10lb kettle bell      Knee/Hip Exercises: Stretches  Gastroc Stretch  Both;2 reps;30 seconds      Knee/Hip Exercises: Machines for Strengthening   Total Gym Leg Press  1 plate, horizontal leg press      Knee/Hip Exercises: Standing   Hip Abduction  20 reps;Both;Knee straight    Lateral Step Up  15 reps;Both;Step Height: 4"      Manual Therapy   Manual therapy comments  edu in self STM with tennis ball               PT Short Term Goals - 01/05/19 0941      PT SHORT TERM GOAL #1   Title  Pt will be I with initial HEP    Status  On-going      PT SHORT TERM GOAL #2   Title  Pt will report less pain overall with ADLs, improved to no more than 6/10 most of the time.    Status  On-going        PT Long Term Goals - 01/05/19 0942      PT LONG TERM GOAL #1   Title  Pt will score 46% or less on FOTO to demonstrate improved functional mobility    Time   6    Period  Weeks    Status  New      PT LONG TERM GOAL #2   Title  Pt will be able to stand to wash dishes, cook for 20 min at a time with min increase pain in back (no more than 5/10)    Time  6    Period  Weeks    Status  New      PT LONG TERM GOAL #3   Title  Pt will demo proper body mechanics for transfers, sitting and standing    Time  6    Period  Weeks    Status  New      PT LONG TERM GOAL #4   Title  pt will be able to walk 2 miles in 25 min on the track to show increased stamina, endurance    Time  6    Period  Weeks    Status  New            Plan - 01/07/19 1025    Clinical Impression Statement  Pt denied need for STM on Lt leg this time as it is feeling pretty good. I encouraged her to increase her walking program to either more days/week or increase distance. Discussed that she will do exercises that are hard on her back such as dishes but that she needs to challenge strength and endurance in the position.    PT Treatment/Interventions  ADLs/Self Care Home Management;Electrical Stimulation;Patient/family education;Therapeutic activities;Taping;Therapeutic exercise;Moist Heat;Functional mobility training;Ultrasound;Manual techniques;Dry needling    PT Next Visit Plan  continue endurance activities    PT Home Exercise Plan  LTR< hamstrgin stretch, knee to chest and PPT, bent knee fall outs, bridge, lateral step up, standing hip abd    Consulted and Agree with Plan of Care  Patient       Patient will benefit from skilled therapeutic intervention in order to improve the following deficits and impairments:  Pain, Improper body mechanics, Impaired sensation, Decreased mobility, Postural dysfunction, Obesity, Impaired flexibility, Decreased endurance, Decreased range of motion, Decreased strength  Visit Diagnosis: 1. Other symptoms and signs involving the musculoskeletal system   2. Pain in thoracic spine   3. Chronic bilateral low back pain without sciatica  Problem List Patient Active Problem List   Diagnosis Date Noted  . Hiatal hernia with GERD without esophagitis 06/18/2017  . Need for influenza vaccination 02/17/2017  . Ventral hernia 01/24/2016  . Type 2 diabetes mellitus without complication, without long-term current use of insulin ([place]) 10/04/2015  . Obesity (BMI 30-39.9) 10/04/2015  . Dyslipidemia 07/27/2015  . Neuropathic pain of both legs 07/27/2015  . Obesity (BMI 30.0-34.9) 12/24/2014  . Genetic testing 11/01/2014  . Constipation - functional 09/24/2014  . Chemotherapy-induced peripheral neuropathy ([place]) 09/24/2014  . Midline low back pain without sciatica 09/24/2014  . Family history of colon cancer   . Ovarian cancer, right ([place]) 07/08/2014  . Hilar adenopathy   . Essential hypertension   . Pulmonary nodule     Tiara Bartoli C. Mariaceleste Herrera PT, DPT 01/07/19 10:28 AM   [place] [facility] [address] [place], [place], [address] Phone: [phone]   Fax:  [phone]  Name: Sarah [name] MRN: [id number] Date of Birth: 1965-11-27

## 2019-01-11 ENCOUNTER — Ambulatory Visit
Admission: RE | Admit: 2019-01-11 | Discharge: 2019-01-11 | Disposition: A | Discharge status: Home / Self Care | Payer: Medicare Other | Source: Ambulatory Visit | Attending: Internal Medicine | Admitting: Internal Medicine

## 2019-01-11 ENCOUNTER — Other Ambulatory Visit: Payer: Self-pay | Admitting: Internal Medicine

## 2019-01-11 ENCOUNTER — Other Ambulatory Visit: Payer: Self-pay

## 2019-01-11 DIAGNOSIS — N63 Unspecified lump in unspecified breast: Secondary | ICD-10-CM

## 2019-01-11 DIAGNOSIS — N6312 Unspecified lump in the right breast, upper inner quadrant: Secondary | ICD-10-CM | POA: Diagnosis not present

## 2019-01-11 DIAGNOSIS — R922 Inconclusive mammogram: Secondary | ICD-10-CM | POA: Diagnosis not present

## 2019-01-11 DIAGNOSIS — R928 Other abnormal and inconclusive findings on diagnostic imaging of breast: Secondary | ICD-10-CM | POA: Diagnosis not present

## 2019-01-11 DIAGNOSIS — N6310 Unspecified lump in the right breast, unspecified quadrant: Secondary | ICD-10-CM | POA: Diagnosis not present

## 2019-01-13 ENCOUNTER — Encounter: Payer: Self-pay | Admitting: Physical Therapy

## 2019-01-13 ENCOUNTER — Ambulatory Visit: Payer: Medicare Other | Attending: Internal Medicine | Admitting: Physical Therapy

## 2019-01-13 ENCOUNTER — Other Ambulatory Visit: Payer: Self-pay

## 2019-01-13 DIAGNOSIS — R29898 Other symptoms and signs involving the musculoskeletal system: Secondary | ICD-10-CM | POA: Diagnosis not present

## 2019-01-13 DIAGNOSIS — M546 Pain in thoracic spine: Secondary | ICD-10-CM | POA: Insufficient documentation

## 2019-01-13 DIAGNOSIS — G8929 Other chronic pain: Secondary | ICD-10-CM | POA: Diagnosis not present

## 2019-01-13 DIAGNOSIS — M545 Low back pain: Secondary | ICD-10-CM | POA: Diagnosis not present

## 2019-01-13 NOTE — Therapy (Signed)
[place], [place], [address] Phone: [phone]   Fax:  [phone]  Physical Therapy Treatment  Patient Details  Name: Sarah [name] MRN: [id number] Date of Birth: 1965/10/15 Referring Provider (PT): Dr. Karle Plumber    Encounter Date: 01/13/2019  PT End of Session - 01/13/19 0938    Visit Number  6    Number of Visits  12    Date for PT Re-Evaluation  02/11/19    Authorization Type  mcr, mcd    Authorization Time Period  PN visit 10    PT Start Time  0931    PT Stop Time  1011    PT Time Calculation (min)  40 min       Past Medical History:  Diagnosis Date  . Arthritis   . Family history of colon cancer   . Glaucoma 02/16  . Glucagonoma 07/28/14   Pt denies this but states she has glaucoma  . History of chemotherapy   . Hypertension   . Imbalance   . Neuropathy    feet bilat  . Nocturia   . Peritoneal carcinomatosis ([place])    carcinoma of ovary   . Shortness of breath dyspnea    hx of 2013 - no problems currently   . Thyroid nodule    history of   . Wears glasses     Past Surgical History:  Procedure Laterality Date  . ABDOMINAL HYSTERECTOMY    . FOOT SURGERY  04/2018   [facility]  . INCISIONAL HERNIA REPAIR N/A 07/06/2015   Procedure: INCISIONAL HERNIA REPAIR ;  Surgeon: Rolm Bookbinder, MD;  Location: [facility];  Service: General;  Laterality: N/A;  . INSERTION OF MESH N/A 07/06/2015   Procedure: WITH INSERTION OF PHASIX ST MESH;  Surgeon: Rolm Bookbinder, MD;  Location: [facility];  Service: General;  Laterality: N/A;  . LAPAROTOMY N/A 07/06/2015   Procedure: EXPLORATORY LAPAROTOMY;  Surgeon: Everitt Amber, MD;  Location: [facility];  Service: Gynecology;  Laterality: N/A;  . LYSIS OF ADHESION N/A 07/06/2015   Procedure:  LYSIS OF ADHESION RESECTION OF MESENTERIC MASS BOWEL RESECTION ;  Surgeon: Everitt Amber, MD;  [facility];  Service: Gynecology;  Laterality: N/A;  . ROBOTIC ASSISTED  TOTAL HYSTERECTOMY WITH BILATERAL SALPINGO OOPHERECTOMY Bilateral 07/28/2014   Procedure: ROBOTIC ASSISTED lysis of adhesions with biopsies, converted to LAPAROTOMY, bilateral salpingoorphorectomy, omentectomy,appendectomy;  Surgeon: Janie Morning, MD;  [facility];  Service: Gynecology;  Laterality: Bilateral;  . THYROIDECTOMY, PARTIAL      There were no vitals filed for this visit.  Subjective Assessment - 01/13/19 0936    Subjective  Pain mostly after activity, once I sit down, starts burning.    Currently in Pain?  No/denies   as much as 10/10 with standing for 5 min or more                      [facility] Adult PT Treatment/Exercise - 01/13/19 0001      Lumbar Exercises: Stretches   Passive Hamstring Stretch Limitations  --    Gastroc Stretch Limitations  edge of UBE bilat    Other Lumbar Stretch Exercise  seated lumar flexion using rolling stool for modified childs pose       Lumbar Exercises: Aerobic   Nustep  L5 UE & LE 5 min      Lumbar Exercises: Standing   Functional Squats  10 reps    Functional Squats Limitations  2 sets at [employer]  20 reps    Theraband Level (Row)  Level 3 (Green)    Shoulder Extension  20 reps    Theraband Level (Shoulder Extension)  Level 3 (Green)    Other Standing Lumbar Exercises  dead lift 10 lb kettle bell   from mat table   Other Standing Lumbar Exercises  bent forward row 10lb kettle bell x 15 each leaning on counter                PT Short Term Goals - 01/05/19 0941      PT SHORT TERM GOAL #1   Title  Pt will be I with initial HEP    Status  On-going      PT SHORT TERM GOAL #2   Title  Pt will report less pain overall with ADLs, improved to no more than 6/10 most of the time.    Status  On-going        PT Long Term Goals - 01/05/19 0942      PT LONG TERM GOAL #1   Title  Pt will score 46% or less on FOTO to demonstrate improved functional mobility    Time  6    Period  Weeks    Status  New       PT LONG TERM GOAL #2   Title  Pt will be able to stand to wash dishes, cook for 20 min at a time with min increase pain in back (no more than 5/10)    Time  6    Period  Weeks    Status  New      PT LONG TERM GOAL #3   Title  Pt will demo proper body mechanics for transfers, sitting and standing    Time  6    Period  Weeks    Status  New      PT LONG TERM GOAL #4   Title  pt will be able to walk 2 miles in 25 min on the track to show increased stamina, endurance    Time  6    Period  Weeks    Status  New            Plan - 01/13/19 0950    Clinical Impression Statement  Pt reports walking 3 days a week on track or park for 1. 5 miles without increased pain in low back. Continued pain with standing activities. Focused closed chain activities for endurace with seated rest breaks required.    PT Next Visit Plan  continue endurance activities    PT Home Exercise Plan  LTR< hamstrgin stretch, knee to chest and PPT, bent knee fall outs, bridge, lateral step up, standing hip abd       Patient will benefit from skilled therapeutic intervention in order to improve the following deficits and impairments:  Pain, Improper body mechanics, Impaired sensation, Decreased mobility, Postural dysfunction, Obesity, Impaired flexibility, Decreased endurance, Decreased range of motion, Decreased strength  Visit Diagnosis: 1. Other symptoms and signs involving the musculoskeletal system   2. Pain in thoracic spine   3. Chronic bilateral low back pain without sciatica        Problem List Patient Active Problem List   Diagnosis Date Noted  . Hiatal hernia with GERD without esophagitis 06/18/2017  . Need for influenza vaccination 02/17/2017  . Ventral hernia 01/24/2016  . Type 2 diabetes mellitus without complication, without long-term current use of  insulin ([place]) 10/04/2015  . Obesity (BMI 30-39.9) 10/04/2015  . Dyslipidemia 07/27/2015  . Neuropathic pain of both legs 07/27/2015  .  Obesity (BMI 30.0-34.9) 12/24/2014  . Genetic testing 11/01/2014  . Constipation - functional 09/24/2014  . Chemotherapy-induced peripheral neuropathy ([place]) 09/24/2014  . Midline low back pain without sciatica 09/24/2014  . Family history of colon cancer   . Ovarian cancer, right ([place]) 07/08/2014  . Hilar adenopathy   . Essential hypertension   . Pulmonary nodule     Dorene Ar, PTA 01/13/2019, 10:41 AM  [facility] [address] [place], [place], [address] Phone: [phone]   Fax:  [phone]  Name: Pricella Gaugh MRN: [id number] Date of Birth: [birth year]

## 2019-01-15 ENCOUNTER — Encounter: Payer: Self-pay | Admitting: Physical Therapy

## 2019-01-15 ENCOUNTER — Ambulatory Visit: Payer: Medicare Other | Admitting: Physical Therapy

## 2019-01-15 ENCOUNTER — Other Ambulatory Visit: Payer: Self-pay

## 2019-01-15 DIAGNOSIS — M545 Low back pain: Secondary | ICD-10-CM | POA: Diagnosis not present

## 2019-01-15 DIAGNOSIS — G8929 Other chronic pain: Secondary | ICD-10-CM | POA: Diagnosis not present

## 2019-01-15 DIAGNOSIS — M546 Pain in thoracic spine: Secondary | ICD-10-CM

## 2019-01-15 DIAGNOSIS — R29898 Other symptoms and signs involving the musculoskeletal system: Secondary | ICD-10-CM | POA: Diagnosis not present

## 2019-01-15 NOTE — Therapy (Signed)
[place], [place], [address] Phone: [phone]   Fax:  [phone]  Physical Therapy Treatment  Patient Details  Name: Sarah [name] MRN: [id number] Date of Birth: [birth year] Referring Provider (PT): Dr. Karle Plumber    Encounter Date: 01/15/2019  PT End of Session - 01/15/19 0937    Visit Number  7    Number of Visits  12    Date for PT Re-Evaluation  02/11/19    Authorization Type  mcr, mcd    Authorization Time Period  PN visit 10    PT Start Time  0935    PT Stop Time  1013    PT Time Calculation (min)  38 min    Activity Tolerance  Patient tolerated treatment well    Behavior During Therapy  [facility] for tasks assessed/performed       Past Medical History:  Diagnosis Date  . Arthritis   . Family history of colon cancer   . Glaucoma 02/16  . Glucagonoma 07/28/14   Pt denies this but states she has glaucoma  . History of chemotherapy   . Hypertension   . Imbalance   . Neuropathy    feet bilat  . Nocturia   . Peritoneal carcinomatosis ([place])    carcinoma of ovary   . Shortness of breath dyspnea    hx of 2013 - no problems currently   . Thyroid nodule    history of   . Wears glasses     Past Surgical History:  Procedure Laterality Date  . ABDOMINAL HYSTERECTOMY    . FOOT SURGERY  04/2018   [facility]  . INCISIONAL HERNIA REPAIR N/A 07/06/2015   Procedure: INCISIONAL HERNIA REPAIR ;  Surgeon: Rolm Bookbinder, MD;  [facility];  Service: General;  Laterality: N/A;  . INSERTION OF MESH N/A 07/06/2015   Procedure: WITH INSERTION OF PHASIX ST MESH;  Surgeon: Rolm Bookbinder, MD;  [facility];  Service: General;  Laterality: N/A;  . LAPAROTOMY N/A 07/06/2015   Procedure: EXPLORATORY LAPAROTOMY;  Surgeon: Everitt Amber, MD;  [facility];  Service: Gynecology;  Laterality: N/A;  . LYSIS OF ADHESION N/A 07/06/2015   Procedure:  LYSIS OF ADHESION RESECTION OF MESENTERIC MASS  BOWEL RESECTION ;  Surgeon: Everitt Amber, MD;  [facility];  Service: Gynecology;  Laterality: N/A;  . ROBOTIC ASSISTED TOTAL HYSTERECTOMY WITH BILATERAL SALPINGO OOPHERECTOMY Bilateral 07/28/2014   Procedure: ROBOTIC ASSISTED lysis of adhesions with biopsies, converted to LAPAROTOMY, bilateral salpingoorphorectomy, omentectomy,appendectomy;  Surgeon: Janie Morning, MD;  [facility];  Service: Gynecology;  Laterality: Bilateral;  . THYROIDECTOMY, PARTIAL      There were no vitals filed for this visit.  Subjective Assessment - 01/15/19 0936    Subjective  Today I feel okay but I had pain in my back yesterday after sweeping. I walked 2 miles yesterday.    Currently in Pain?  No/denies                       [facility] Adult PT Treatment/Exercise - 01/15/19 0001      Lumbar Exercises: Stretches   Other Lumbar Stretch Exercise  physioball rollouts fwd & diagonals      Lumbar Exercises: Aerobic   Nustep  L5 UE & LE 5 min      Lumbar Exercises: Standing   Row Limitations  bent over row green tband    Other Standing Lumbar Exercises  green band: high-low  chop, shoulder to shoulder rotation NBOS, antirotation      Lumbar Exercises: Seated   Other Seated Lumbar Exercises  abd to 90 & flx to 90 tiny circles 1lb each    Other Seated Lumbar Exercises  single arm punch 5lb each      Knee/Hip Exercises: Stretches   Passive Hamstring Stretch Limitations  seated EOB    Quad Stretch Limitations  standing hold foot    Gastroc Stretch Limitations  standing at chair               PT Short Term Goals - 01/05/19 0941      PT SHORT TERM GOAL #1   Title  Pt will be I with initial HEP    Status  On-going      PT SHORT TERM GOAL #2   Title  Pt will report less pain overall with ADLs, improved to no more than 6/10 most of the time.    Status  On-going        PT Long Term Goals - 01/05/19 0942      PT LONG TERM GOAL #1   Title  Pt will score 46% or less on FOTO to  demonstrate improved functional mobility    Time  6    Period  Weeks    Status  New      PT LONG TERM GOAL #2   Title  Pt will be able to stand to wash dishes, cook for 20 min at a time with min increase pain in back (no more than 5/10)    Time  6    Period  Weeks    Status  New      PT LONG TERM GOAL #3   Title  Pt will demo proper body mechanics for transfers, sitting and standing    Time  6    Period  Weeks    Status  New      PT LONG TERM GOAL #4   Title  pt will be able to walk 2 miles in 25 min on the track to show increased stamina, endurance    Time  6    Period  Weeks    Status  New            Plan - 01/15/19 1014    Clinical Impression Statement  Increased core engagement with rotational exercises as well as increased free weight. Pt reported feeling good and we discussed soreness/fatigue vs pain.    PT Treatment/Interventions  ADLs/Self Care Home Management;Electrical Stimulation;Patient/family education;Therapeutic activities;Taping;Therapeutic exercise;Moist Heat;Functional mobility training;Ultrasound;Manual techniques;Dry needling    PT Next Visit Plan  lifting exercises    PT Home Exercise Plan  LTR< hamstrgin stretch, knee to chest and PPT, bent knee fall outs, bridge, lateral step up, standing hip abd, chop, antirotation, bent over row, fwd punch with weights    Consulted and Agree with Plan of Care  Patient       Patient will benefit from skilled therapeutic intervention in order to improve the following deficits and impairments:  Pain, Improper body mechanics, Impaired sensation, Decreased mobility, Postural dysfunction, Obesity, Impaired flexibility, Decreased endurance, Decreased range of motion, Decreased strength  Visit Diagnosis: 1. Pain in thoracic spine   2. Other symptoms and signs involving the musculoskeletal system   3. Chronic bilateral low back pain without sciatica        Problem List Patient Active Problem List   Diagnosis Date  Noted  . Hiatal hernia with GERD  without esophagitis 06/18/2017  . Need for influenza vaccination 02/17/2017  . Ventral hernia 01/24/2016  . Type 2 diabetes mellitus without complication, without long-term current use of insulin ([place]) 10/04/2015  . Obesity (BMI 30-39.9) 10/04/2015  . Dyslipidemia 07/27/2015  . Neuropathic pain of both legs 07/27/2015  . Obesity (BMI 30.0-34.9) 12/24/2014  . Genetic testing 11/01/2014  . Constipation - functional 09/24/2014  . Chemotherapy-induced peripheral neuropathy ([place]) 09/24/2014  . Midline low back pain without sciatica 09/24/2014  . Family history of colon cancer   . Ovarian cancer, right ([place]) 07/08/2014  . Hilar adenopathy   . Essential hypertension   . Pulmonary nodule    Gareth Fitzner C. Avril Busser PT, DPT 01/15/19 10:16 AM   [facility] [address] [place], [place], [address] Phone: [phone]   Fax:  [phone]  Name: Sarah [name] MRN: [id number] Date of Birth: December 18, 1965

## 2019-01-19 ENCOUNTER — Ambulatory Visit: Payer: Medicare Other | Admitting: Physical Therapy

## 2019-01-19 ENCOUNTER — Encounter: Payer: Self-pay | Admitting: Physical Therapy

## 2019-01-19 ENCOUNTER — Other Ambulatory Visit: Payer: Self-pay

## 2019-01-19 DIAGNOSIS — M545 Low back pain, unspecified: Secondary | ICD-10-CM

## 2019-01-19 DIAGNOSIS — G8929 Other chronic pain: Secondary | ICD-10-CM

## 2019-01-19 DIAGNOSIS — M546 Pain in thoracic spine: Secondary | ICD-10-CM | POA: Diagnosis not present

## 2019-01-19 DIAGNOSIS — R29898 Other symptoms and signs involving the musculoskeletal system: Secondary | ICD-10-CM | POA: Diagnosis not present

## 2019-01-19 NOTE — Therapy (Signed)
[place], [place], [address] Phone: [phone]   Fax:  [phone]  Physical Therapy Treatment  Patient Details  Name: Sarah [name] MRN: [id number] Date of Birth: February 02, 1966 Referring Provider (PT): Dr. Karle Plumber    Encounter Date: 01/19/2019  PT End of Session - 01/19/19 0930    Visit Number  8    Number of Visits  12    Date for PT Re-Evaluation  02/11/19    PT Start Time  0930    PT Stop Time  1025    PT Time Calculation (min)  55 min       Past Medical History:  Diagnosis Date  . Arthritis   . Family history of colon cancer   . Glaucoma 02/16  . Glucagonoma 07/28/14   Pt denies this but states she has glaucoma  . History of chemotherapy   . Hypertension   . Imbalance   . Neuropathy    feet bilat  . Nocturia   . Peritoneal carcinomatosis ([place])    carcinoma of ovary   . Shortness of breath dyspnea    hx of 2013 - no problems currently   . Thyroid nodule    history of   . Wears glasses     Past Surgical History:  Procedure Laterality Date  . ABDOMINAL HYSTERECTOMY    . FOOT SURGERY  04/2018   [facility]  . INCISIONAL HERNIA REPAIR N/A 07/06/2015   Procedure: INCISIONAL HERNIA REPAIR ;  Surgeon: Rolm Bookbinder, MD;  [facility];  Service: General;  Laterality: N/A;  . INSERTION OF MESH N/A 07/06/2015   Procedure: WITH INSERTION OF PHASIX ST MESH;  Surgeon: Rolm Bookbinder, MD;  [facility];  Service: General;  Laterality: N/A;  . LAPAROTOMY N/A 07/06/2015   Procedure: EXPLORATORY LAPAROTOMY;  Surgeon: Everitt Amber, MD;  [facility];  Service: Gynecology;  Laterality: N/A;  . LYSIS OF ADHESION N/A 07/06/2015   Procedure:  LYSIS OF ADHESION RESECTION OF MESENTERIC MASS BOWEL RESECTION ;  Surgeon: Everitt Amber, MD;  [facility];  Service: Gynecology;  Laterality: N/A;  . ROBOTIC ASSISTED TOTAL HYSTERECTOMY WITH BILATERAL SALPINGO OOPHERECTOMY Bilateral  07/28/2014   Procedure: ROBOTIC ASSISTED lysis of adhesions with biopsies, converted to LAPAROTOMY, bilateral salpingoorphorectomy, omentectomy,appendectomy;  Surgeon: Janie Morning, MD;  [facility];  Service: Gynecology;  Laterality: Bilateral;  . THYROIDECTOMY, PARTIAL      There were no vitals filed for this visit.  Subjective Assessment - 01/19/19 0934    Subjective  Pain is a 6-7/10 today. I bent over to slide my TV tray out of the way and when I stood up straight I felt a sharp on my left side, low back. I tried stretching , heat and ice and it is still painful.    Currently in Pain?  Yes    Pain Score  7     Pain Location  Back    Pain Orientation  Left    Pain Descriptors / Indicators  Sore;Tender    Pain Type  Chronic pain    Aggravating Factors   bending and sliding tray table    Pain Relieving Factors  sitting, rest, heat, ice, tennis ball massage.                       [facility] Adult PT Treatment/Exercise - 01/19/19 0001      Lumbar Exercises: Stretches   Lower Trunk Rotation  10  seconds    Piriformis Stretch Limitations  figure 4 pull knee, push knee     Other Lumbar Stretch Exercise  physioball rollouts fwd & diagonals      Lumbar Exercises: Aerobic   Nustep  L3 UE & LE 5 min      Lumbar Exercises: Supine   Pelvic Tilt  10 reps    Bridge  15 reps;5 seconds      Knee/Hip Exercises: Stretches   Passive Hamstring Stretch Limitations  seated EOB    Quad Stretch Limitations  standing hold foot    Gastroc Stretch Limitations  slant board       Modalities   Modalities  Electrical Stimulation      Moist Heat Therapy   Number Minutes Moist Heat  15 Minutes    Moist Heat Location  Lumbar Spine      Electrical Stimulation   Electrical Stimulation Location  Left gluteal    Electrical Stimulation Action  IFC x 15 min    Electrical Stimulation Parameters  47mA    Electrical Stimulation Goals  Pain      Manual Therapy   Soft tissue mobilization   Left gluteals, piriformis with massage roller in sidelying               PT Short Term Goals - 01/05/19 0941      PT SHORT TERM GOAL #1   Title  Pt will be I with initial HEP    Status  On-going      PT SHORT TERM GOAL #2   Title  Pt will report less pain overall with ADLs, improved to no more than 6/10 most of the time.    Status  On-going        PT Long Term Goals - 01/05/19 0942      PT LONG TERM GOAL #1   Title  Pt will score 46% or less on FOTO to demonstrate improved functional mobility    Time  6    Period  Weeks    Status  New      PT LONG TERM GOAL #2   Title  Pt will be able to stand to wash dishes, cook for 20 min at a time with min increase pain in back (no more than 5/10)    Time  6    Period  Weeks    Status  New      PT LONG TERM GOAL #3   Title  Pt will demo proper body mechanics for transfers, sitting and standing    Time  6    Period  Weeks    Status  New      PT LONG TERM GOAL #4   Title  pt will be able to walk 2 miles in 25 min on the track to show increased stamina, endurance    Time  6    Period  Weeks    Status  New            Plan - 01/19/19 1018    Clinical Impression Statement  Increased pain today therefore treatment focused on stretching, manual and modalities to decrease pain. Trial of IFC to posterior left hip.    PT Next Visit Plan  lifting exercises, if pain decreased from this session; assess benefit of IFC; needs FOTO    PT Home Exercise Plan  LTR< hamstrgin stretch, knee to chest and PPT, bent knee fall outs, bridge, lateral step up, standing hip abd, chop, antirotation, bent  over row, fwd punch with weights    Consulted and Agree with Plan of Care  Patient       Patient will benefit from skilled therapeutic intervention in order to improve the following deficits and impairments:  Pain, Improper body mechanics, Impaired sensation, Decreased mobility, Postural dysfunction, Obesity, Impaired flexibility, Decreased  endurance, Decreased range of motion, Decreased strength  Visit Diagnosis: 1. Pain in thoracic spine   2. Other symptoms and signs involving the musculoskeletal system   3. Chronic bilateral low back pain without sciatica        Problem List Patient Active Problem List   Diagnosis Date Noted  . Hiatal hernia with GERD without esophagitis 06/18/2017  . Need for influenza vaccination 02/17/2017  . Ventral hernia 01/24/2016  . Type 2 diabetes mellitus without complication, without long-term current use of insulin ([place]) 10/04/2015  . Obesity (BMI 30-39.9) 10/04/2015  . Dyslipidemia 07/27/2015  . Neuropathic pain of both legs 07/27/2015  . Obesity (BMI 30.0-34.9) 12/24/2014  . Genetic testing 11/01/2014  . Constipation - functional 09/24/2014  . Chemotherapy-induced peripheral neuropathy ([place]) 09/24/2014  . Midline low back pain without sciatica 09/24/2014  . Family history of colon cancer   . Ovarian cancer, right ([place]) 07/08/2014  . Hilar adenopathy   . Essential hypertension   . Pulmonary nodule     Dorene Ar, PTA 01/19/2019, 10:20 AM  [place], [place], [address] Phone: [phone]   Fax:  [phone]  Name: Sarah [name] MRN: [id number] Date of Birth: 1966-03-27

## 2019-01-21 ENCOUNTER — Ambulatory Visit: Payer: Medicare Other | Admitting: Physical Therapy

## 2019-01-21 ENCOUNTER — Encounter: Payer: Self-pay | Admitting: Physical Therapy

## 2019-01-21 ENCOUNTER — Other Ambulatory Visit: Payer: Self-pay

## 2019-01-21 DIAGNOSIS — M545 Low back pain, unspecified: Secondary | ICD-10-CM

## 2019-01-21 DIAGNOSIS — R29898 Other symptoms and signs involving the musculoskeletal system: Secondary | ICD-10-CM | POA: Diagnosis not present

## 2019-01-21 DIAGNOSIS — G8929 Other chronic pain: Secondary | ICD-10-CM | POA: Diagnosis not present

## 2019-01-21 DIAGNOSIS — M546 Pain in thoracic spine: Secondary | ICD-10-CM

## 2019-01-21 NOTE — Therapy (Addendum)
[place], [place], [address] Phone: [phone]   Fax:  [phone]  Physical Therapy Treatment  Patient Details  Name: Sarah [name] MRN: [id number] Date of Birth: [birth year] Referring Provider (PT): Dr. Karle Plumber    Encounter Date: 01/21/2019  PT End of Session - 01/21/19 0931    Visit Number  9    Number of Visits  12    Date for PT Re-Evaluation  02/11/19    Authorization Type  mcr, mcd    Authorization Time Period  PN visit 10    PT Start Time  0926    PT Stop Time  1025    PT Time Calculation (min)  59 min       Past Medical History:  Diagnosis Date  . Arthritis   . Family history of colon cancer   . Glaucoma 02/16  . Glucagonoma 07/28/14   Pt denies this but states she has glaucoma  . History of chemotherapy   . Hypertension   . Imbalance   . Neuropathy    feet bilat  . Nocturia   . Peritoneal carcinomatosis ([place])    carcinoma of ovary   . Shortness of breath dyspnea    hx of 2013 - no problems currently   . Thyroid nodule    history of   . Wears glasses     Past Surgical History:  Procedure Laterality Date  . ABDOMINAL HYSTERECTOMY    . FOOT SURGERY  04/2018   [facility]  . INCISIONAL HERNIA REPAIR N/A 07/06/2015   Procedure: INCISIONAL HERNIA REPAIR ;  Surgeon: Rolm Bookbinder, MD;  Location: [facility];  Service: General;  Laterality: N/A;  . INSERTION OF MESH N/A 07/06/2015   Procedure: WITH INSERTION OF PHASIX ST MESH;  Surgeon: Rolm Bookbinder, MD;  [facility];  Service: General;  Laterality: N/A;  . LAPAROTOMY N/A 07/06/2015   Procedure: EXPLORATORY LAPAROTOMY;  Surgeon: Everitt Amber, MD;  [facility];  Service: Gynecology;  Laterality: N/A;  . LYSIS OF ADHESION N/A 07/06/2015   Procedure:  LYSIS OF ADHESION RESECTION OF MESENTERIC MASS BOWEL RESECTION ;  Surgeon: Everitt Amber, MD;  [facility];  Service: Gynecology;  Laterality: N/A;  . ROBOTIC  ASSISTED TOTAL HYSTERECTOMY WITH BILATERAL SALPINGO OOPHERECTOMY Bilateral 07/28/2014   Procedure: ROBOTIC ASSISTED lysis of adhesions with biopsies, converted to LAPAROTOMY, bilateral salpingoorphorectomy, omentectomy,appendectomy;  Surgeon: Janie Morning, MD;  [facility];  Service: Gynecology;  Laterality: Bilateral;  . THYROIDECTOMY, PARTIAL      There were no vitals filed for this visit.  Subjective Assessment - 01/21/19 0929    Subjective  Still painful but a little better since pain exacerbation. Pain still going into left thigh. Felt relief after hot bath yesterday then pain returned this morning.    Currently in Pain?  Yes    Pain Score  5     Pain Location  Back    Pain Orientation  Left    Pain Descriptors / Indicators  Aching;Sore                       [facility] Adult PT Treatment/Exercise - 01/21/19 0001      Lumbar Exercises: Stretches   Lower Trunk Rotation  10 seconds    Piriformis Stretch Limitations  figure 4 pull knee, push knee       Lumbar Exercises: Aerobic   Nustep  L3 UE & LE 5 min  Lumbar Exercises: Supine   Bridge  20 reps;5 seconds      Knee/Hip Exercises: Stretches   Active Hamstring Stretch  2 reps;30 seconds      Knee/Hip Exercises: Sidelying   Hip ABduction  Left;10 reps    Clams   x 20 left , reverse x 20 left       Knee/Hip Exercises: Prone   Hip Extension  10 reps;2 sets    Hip Extension Limitations  bilateral    Other Prone Exercises  Prone IR/ER AROM with knees at 90, ankle press into ball with gluteal squeeze       Electrical Stimulation   Electrical Stimulation Location  Left gluteal    Electrical Stimulation Action  IFC x 15 minutes     Electrical Stimulation Parameters  to tolerance     Electrical Stimulation Goals  Pain      Manual Therapy   Soft tissue mobilization  prone tack and stretch left hip , manual trigger point release to left piriformis , glute medius             PT Education - 01/21/19  1010    Education Details  TENS unit and where to purchase    Person(s) Educated  Patient    Methods  Explanation;Handout    Comprehension  Verbalized understanding       PT Short Term Goals - 01/21/19 1119      PT SHORT TERM GOAL #1   Title  Pt will be I with initial HEP    Time  3    Period  Weeks    Status  Achieved      PT SHORT TERM GOAL #2   Title  Pt will report less pain overall with ADLs, improved to no more than 6/10 most of the time.    Time  4    Period  Weeks    Status  On-going        PT Long Term Goals - 01/05/19 0942      PT LONG TERM GOAL #1   Title  Pt will score 46% or less on FOTO to demonstrate improved functional mobility    Time  6    Period  Weeks    Status  New      PT LONG TERM GOAL #2   Title  Pt will be able to stand to wash dishes, cook for 20 min at a time with min increase pain in back (no more than 5/10)    Time  6    Period  Weeks    Status  New      PT LONG TERM GOAL #3   Title  Pt will demo proper body mechanics for transfers, sitting and standing    Time  6    Period  Weeks    Status  New      PT LONG TERM GOAL #4   Title  pt will be able to walk 2 miles in 25 min on the track to show increased stamina, endurance    Time  6    Period  Weeks    Status  New            Plan - 01/21/19 0931    Clinical Impression Statement  Pt reports decreased pain after last session for about an hour and then pain returned at a lesser intensity. Pain still radiating into left thigh. Session focused on stretching and soft tissue to left hip to decrease  pain and tightness. IFC repeated as she reports this was helpful last session. STG#1 met. No additonal goals met due to recent pain exacerbation.    PT Next Visit Plan  lifting exercises, if pain decreased from this session; assess benefit of IFC; needs FOTO, check goals , reprint TENS info- forgot to give to patient    PT Home Exercise Plan  LTR< hamstrgin stretch, knee to chest and PPT,  bent knee fall outs, bridge, lateral step up, standing hip abd, chop, antirotation, bent over row, fwd punch with weights       Patient will benefit from skilled therapeutic intervention in order to improve the following deficits and impairments:  Pain, Improper body mechanics, Impaired sensation, Decreased mobility, Postural dysfunction, Obesity, Impaired flexibility, Decreased endurance, Decreased range of motion, Decreased strength  Visit Diagnosis: 1. Pain in thoracic spine   2. Other symptoms and signs involving the musculoskeletal system   3. Chronic bilateral low back pain without sciatica        Problem List Patient Active Problem List   Diagnosis Date Noted  . Hiatal hernia with GERD without esophagitis 06/18/2017  . Need for influenza vaccination 02/17/2017  . Ventral hernia 01/24/2016  . Type 2 diabetes mellitus without complication, without long-term current use of insulin ([place]) 10/04/2015  . Obesity (BMI 30-39.9) 10/04/2015  . Dyslipidemia 07/27/2015  . Neuropathic pain of both legs 07/27/2015  . Obesity (BMI 30.0-34.9) 12/24/2014  . Genetic testing 11/01/2014  . Constipation - functional 09/24/2014  . Chemotherapy-induced peripheral neuropathy ([place]) 09/24/2014  . Midline low back pain without sciatica 09/24/2014  . Family history of colon cancer   . Ovarian cancer, right ([place]) 07/08/2014  . Hilar adenopathy   . Essential hypertension   . Pulmonary nodule     Dorene Ar, [place] 01/21/2019, 11:21 AM  [facility] [address] [place], [place], [address] Phone: [phone]   Fax:  [phone]  Name: Sarah [name] MRN: [id number] Date of Birth: March 11, 1966

## 2019-01-21 NOTE — Patient Instructions (Signed)

## 2019-01-26 ENCOUNTER — Ambulatory Visit: Payer: Medicare Other | Admitting: Physical Therapy

## 2019-01-26 ENCOUNTER — Other Ambulatory Visit: Payer: Self-pay

## 2019-01-26 ENCOUNTER — Encounter: Payer: Self-pay | Admitting: Physical Therapy

## 2019-01-26 DIAGNOSIS — M545 Low back pain: Secondary | ICD-10-CM | POA: Diagnosis not present

## 2019-01-26 DIAGNOSIS — G8929 Other chronic pain: Secondary | ICD-10-CM

## 2019-01-26 DIAGNOSIS — M546 Pain in thoracic spine: Secondary | ICD-10-CM

## 2019-01-26 DIAGNOSIS — R29898 Other symptoms and signs involving the musculoskeletal system: Secondary | ICD-10-CM

## 2019-01-26 NOTE — Therapy (Signed)
[place], [place], [address] Phone: [phone]   Fax:  [phone]  Physical Therapy Treatment Progress Note Reporting Period 12/17/2018 to 01/26/2019   See note below for Objective Data and Assessment of Progress/Goals.       Patient Details  Name: Sarah [name] MRN: [id number] Date of Birth: [birth year] Referring Provider (PT): Dr. Karle Plumber    Encounter Date: 01/26/2019  PT End of Session - 01/26/19 1009    Visit Number  10    Number of Visits  12    Date for PT Re-Evaluation  02/11/19    Authorization Type  mcr, mcd    Authorization Time Period  PN visit 76     KX at visit 15    PT Start Time  0931    PT Stop Time  1014    PT Time Calculation (min)  43 min    Activity Tolerance  Patient tolerated treatment well    Behavior During Therapy  [facility] for tasks assessed/performed       Past Medical History:  Diagnosis Date  . Arthritis   . Family history of colon cancer   . Glaucoma 02/16  . Glucagonoma 07/28/14   Pt denies this but states she has glaucoma  . History of chemotherapy   . Hypertension   . Imbalance   . Neuropathy    feet bilat  . Nocturia   . Peritoneal carcinomatosis ([place])    carcinoma of ovary   . Shortness of breath dyspnea    hx of 2013 - no problems currently   . Thyroid nodule    history of   . Wears glasses     Past Surgical History:  Procedure Laterality Date  . ABDOMINAL HYSTERECTOMY    . FOOT SURGERY  04/2018   [facility]  . INCISIONAL HERNIA REPAIR N/A 07/06/2015   Procedure: INCISIONAL HERNIA REPAIR ;  Surgeon: Rolm Bookbinder, MD;  [facility];  Service: General;  Laterality: N/A;  . INSERTION OF MESH N/A 07/06/2015   Procedure: WITH INSERTION OF PHASIX ST MESH;  Surgeon: Rolm Bookbinder, MD;  [facility];  Service: General;  Laterality: N/A;  . LAPAROTOMY N/A 07/06/2015   Procedure: EXPLORATORY LAPAROTOMY;  Surgeon: Everitt Amber, MD;   [facility];  Service: Gynecology;  Laterality: N/A;  . LYSIS OF ADHESION N/A 07/06/2015   Procedure:  LYSIS OF ADHESION RESECTION OF MESENTERIC MASS BOWEL RESECTION ;  Surgeon: Everitt Amber, MD;  [facility];  Service: Gynecology;  Laterality: N/A;  . ROBOTIC ASSISTED TOTAL HYSTERECTOMY WITH BILATERAL SALPINGO OOPHERECTOMY Bilateral 07/28/2014   Procedure: ROBOTIC ASSISTED lysis of adhesions with biopsies, converted to LAPAROTOMY, bilateral salpingoorphorectomy, omentectomy,appendectomy;  Surgeon: Janie Morning, MD;  [facility];  Service: Gynecology;  Laterality: Bilateral;  . THYROIDECTOMY, PARTIAL      There were no vitals filed for this visit.  Subjective Assessment - 01/26/19 0934    Subjective  Still feeling pain on Left post hip that wraps to knee, it is better than it was. Pain never went past the knee. TENS unit with heat was very helpful.    Patient Stated Goals  Pt wants to have less pain    Currently in Pain?  Yes    Pain Score  7     Pain Location  Back    Pain Orientation  Left;Lower    Pain Descriptors / Indicators  --   pain   Pain Radiating Towards  anterior knee                       [facility] Adult PT Treatment/Exercise - 01/26/19 0001      Lumbar Exercises: Aerobic   Nustep  L4 LE&Ue 3 min; 5 min end of session   stopped due to increased pain     Lumbar Exercises: Standing   Other Standing Lumbar Exercises  weighted lumbar extension   created centralization     Lumbar Exercises: Supine   Bridge with Ball Squeeze  10 reps;3 seconds      Knee/Hip Exercises: Stretches   Passive Hamstring Stretch Limitations  supine with strap    Piriformis Stretch Limitations  supine pull across    Other Knee/Hip Stretches  LTR x5 each direction             PT Education - 01/26/19 1107    Education Details  anatomy  of condition, disc symptoms, centralization/peripheralization, goals, home TENS unit    Person(s) Educated  Patient    Methods   Explanation;Handout    Comprehension  Verbalized understanding       PT Short Term Goals - 01/21/19 1119      PT SHORT TERM GOAL #1   Title  Pt will be I with initial HEP    Time  3    Period  Weeks    Status  Achieved      PT SHORT TERM GOAL #2   Title  Pt will report less pain overall with ADLs, improved to no more than 6/10 most of the time.    Time  4    Period  Weeks    Status  On-going        PT Long Term Goals - 01/26/19 1006      PT LONG TERM GOAL #1   Title  Pt will score 46% or less on FOTO to demonstrate improved functional mobility    Baseline  50% limited    Status  On-going      PT LONG TERM GOAL #2   Title  Pt will be able to stand to wash dishes, cook for 20 min at a time with min increase pain in back (no more than 5/10)    Baseline  7 minutes    Status  On-going      PT LONG TERM GOAL #3   Title  Pt will demo proper body mechanics for transfers, sitting and standing    Baseline  with cues, able to demo good mechanics but tends to utilize lumbar extension as modification    Status  On-going      PT LONG TERM GOAL #4   Title  pt will be able to walk 2 miles in 25 min on the track to show increased stamina, endurance    Baseline  2 miles on Saturday that took about 45 min due to pain    Status  On-going            Plan - 01/26/19 1004    Clinical Impression Statement  Denied feeling any discomfort upon standing after manual therapy and stretches. Provided with handout for home TENS, denied use of IFC today.    PT Treatment/Interventions  ADLs/Self Care Home Management;Electrical Stimulation;Patient/family education;Therapeutic activities;Taping;Therapeutic exercise;Moist Heat;Functional mobility training;Ultrasound;Manual techniques;Dry needling    PT Next Visit Plan  challenge walking, STM PRN, extension bias in standing    PT Home Exercise Plan  LTR< hamstrgin stretch, knee to chest  and PPT, bent knee fall outs, bridge, lateral step up, standing  hip abd, chop, antirotation, bent over row, fwd punch with weights    Consulted and Agree with Plan of Care  Patient       Patient will benefit from skilled therapeutic intervention in order to improve the following deficits and impairments:  Pain, Improper body mechanics, Impaired sensation, Decreased mobility, Postural dysfunction, Obesity, Impaired flexibility, Decreased endurance, Decreased range of motion, Decreased strength  Visit Diagnosis: 1. Pain in thoracic spine   2. Other symptoms and signs involving the musculoskeletal system   3. Chronic bilateral low back pain without sciatica        Problem List Patient Active Problem List   Diagnosis Date Noted  . Hiatal hernia with GERD without esophagitis 06/18/2017  . Need for influenza vaccination 02/17/2017  . Ventral hernia 01/24/2016  . Type 2 diabetes mellitus without complication, without long-term current use of insulin ([place]) 10/04/2015  . Obesity (BMI 30-39.9) 10/04/2015  . Dyslipidemia 07/27/2015  . Neuropathic pain of both legs 07/27/2015  . Obesity (BMI 30.0-34.9) 12/24/2014  . Genetic testing 11/01/2014  . Constipation - functional 09/24/2014  . Chemotherapy-induced peripheral neuropathy ([place]) 09/24/2014  . Midline low back pain without sciatica 09/24/2014  . Family history of colon cancer   . Ovarian cancer, right ([place]) 07/08/2014  . Hilar adenopathy   . Essential hypertension   . Pulmonary nodule     Ilean Spradlin C. Dennie Vecchio PT, DPT 01/26/19 11:09 AM   [facility] [address] [place], [place], [address] Phone: [phone]   Fax:  [phone]  Name: Sarah [name] MRN: [id number] Date of Birth: 02-09-1966

## 2019-01-28 ENCOUNTER — Encounter: Payer: Self-pay | Admitting: Physical Therapy

## 2019-01-28 ENCOUNTER — Ambulatory Visit: Payer: Medicare Other | Admitting: Physical Therapy

## 2019-01-28 ENCOUNTER — Other Ambulatory Visit: Payer: Self-pay

## 2019-01-28 DIAGNOSIS — M545 Low back pain: Secondary | ICD-10-CM | POA: Diagnosis not present

## 2019-01-28 DIAGNOSIS — G8929 Other chronic pain: Secondary | ICD-10-CM | POA: Diagnosis not present

## 2019-01-28 DIAGNOSIS — R29898 Other symptoms and signs involving the musculoskeletal system: Secondary | ICD-10-CM | POA: Diagnosis not present

## 2019-01-28 DIAGNOSIS — M546 Pain in thoracic spine: Secondary | ICD-10-CM

## 2019-01-28 NOTE — Therapy (Signed)
[place], [place], [address] Phone: [phone]   Fax:  [phone]  Physical Therapy Treatment  Patient Details  Name: Sarah [name] MRN: [id number] Date of Birth: December 03, 1965 Referring Provider (PT): Dr. Karle Plumber    Encounter Date: 01/28/2019  PT End of Session - 01/28/19 1152    Visit Number  11    Number of Visits  12    Date for PT Re-Evaluation  02/11/19    Authorization Type  mcr, mcd    Authorization Time Period  PN visit 32     KX at visit 15    PT Start Time  1147    PT Stop Time  1227    PT Time Calculation (min)  40 min       Past Medical History:  Diagnosis Date  . Arthritis   . Family history of colon cancer   . Glaucoma 02/16  . Glucagonoma 07/28/14   Pt denies this but states she has glaucoma  . History of chemotherapy   . Hypertension   . Imbalance   . Neuropathy    feet bilat  . Nocturia   . Peritoneal carcinomatosis ([place])    carcinoma of ovary   . Shortness of breath dyspnea    hx of 2013 - no problems currently   . Thyroid nodule    history of   . Wears glasses     Past Surgical History:  Procedure Laterality Date  . ABDOMINAL HYSTERECTOMY    . FOOT SURGERY  04/2018   [facility]  . INCISIONAL HERNIA REPAIR N/A 07/06/2015   Procedure: INCISIONAL HERNIA REPAIR ;  Surgeon: Rolm Bookbinder, MD;  [facility];  Service: General;  Laterality: N/A;  . INSERTION OF MESH N/A 07/06/2015   Procedure: WITH INSERTION OF PHASIX ST MESH;  Surgeon: Rolm Bookbinder, MD;  [facility];  Service: General;  Laterality: N/A;  . LAPAROTOMY N/A 07/06/2015   Procedure: EXPLORATORY LAPAROTOMY;  Surgeon: Everitt Amber, MD;  [facility];  Service: Gynecology;  Laterality: N/A;  . LYSIS OF ADHESION N/A 07/06/2015   Procedure:  LYSIS OF ADHESION RESECTION OF MESENTERIC MASS BOWEL RESECTION ;  Surgeon: Everitt Amber, MD;  [facility];  Service: Gynecology;  Laterality: N/A;   . ROBOTIC ASSISTED TOTAL HYSTERECTOMY WITH BILATERAL SALPINGO OOPHERECTOMY Bilateral 07/28/2014   Procedure: ROBOTIC ASSISTED lysis of adhesions with biopsies, converted to LAPAROTOMY, bilateral salpingoorphorectomy, omentectomy,appendectomy;  Surgeon: Janie Morning, MD;  [facility];  Service: Gynecology;  Laterality: Bilateral;  . THYROIDECTOMY, PARTIAL      There were no vitals filed for this visit.      [facility] PT Assessment - 01/28/19 0001      Observation/Other Assessments   Focus on Therapeutic Outcomes (FOTO)   50% status on 01/26/19                   [facility] Adult PT Treatment/Exercise - 01/28/19 0001      Lumbar Exercises: Stretches   Single Knee to Chest Stretch  2 reps;20 seconds    Lower Trunk Rotation  10 seconds      Lumbar Exercises: Aerobic   Nustep  L5 UE & LE 5 min      Lumbar Exercises: Standing   Row  20 reps    Theraband Level (Row)  Level 3 (Green)    Shoulder Extension  20 reps    Theraband Level (Shoulder Extension)  Level 3 (Green)  Other Standing Lumbar Exercises  Green band : shoulder to shoulder rotation NBOS, antirotation    Other Standing Lumbar Exercises  dead lift AROM , then 5# from cybex hip step       Lumbar Exercises: Supine   Pelvic Tilt  10 reps    Bent Knee Raise  20 reps    Bent Knee Raise Limitations  with abdominal draw in     Dead Bug  20 reps    Bridge  20 reps;5 seconds      Knee/Hip Exercises: Prone   Hip Extension  10 reps    Other Prone Exercises  prone RUE, LLE x 10, then opposite sides x 10                PT Short Term Goals - 01/21/19 1119      PT SHORT TERM GOAL #1   Title  Pt will be I with initial HEP    Time  3    Period  Weeks    Status  Achieved      PT SHORT TERM GOAL #2   Title  Pt will report less pain overall with ADLs, improved to no more than 6/10 most of the time.    Time  4    Period  Weeks    Status  On-going        PT Long Term Goals - 01/26/19 1006      PT LONG  TERM GOAL #1   Title  Pt will score 46% or less on FOTO to demonstrate improved functional mobility    Baseline  50% limited    Status  On-going      PT LONG TERM GOAL #2   Title  Pt will be able to stand to wash dishes, cook for 20 min at a time with min increase pain in back (no more than 5/10)    Baseline  7 minutes    Status  On-going      PT LONG TERM GOAL #3   Title  Pt will demo proper body mechanics for transfers, sitting and standing    Baseline  with cues, able to demo good mechanics but tends to utilize lumbar extension as modification    Status  On-going      PT LONG TERM GOAL #4   Title  pt will be able to walk 2 miles in 25 min on the track to show increased stamina, endurance    Baseline  2 miles on Saturday that took about 45 min due to pain    Status  On-going            Plan - 01/28/19 1219    Clinical Impression Statement  Pt reports pain centrailzed to lower lumbar with 7/10 pain. Challenged standing core strength and prone core strength-pt reports okay on stomach. Declined modalities. Does plan to order home TENS.    PT Next Visit Plan  challenge walking, STM PRN, extension bias in standing    PT Home Exercise Plan  LTR< hamstrgin stretch, knee to chest and PPT, bent knee fall outs, bridge, lateral step up, standing hip abd, chop, antirotation, bent over row, fwd punch with weights       Patient will benefit from skilled therapeutic intervention in order to improve the following deficits and impairments:  Pain, Improper body mechanics, Impaired sensation, Decreased mobility, Postural dysfunction, Obesity, Impaired flexibility, Decreased endurance, Decreased range of motion, Decreased strength  Visit Diagnosis: Pain in thoracic spine  Other symptoms and signs involving the musculoskeletal system  Chronic bilateral low back pain without sciatica     Problem List Patient Active Problem List   Diagnosis Date Noted  . Hiatal hernia with GERD without  esophagitis 06/18/2017  . Need for influenza vaccination 02/17/2017  . Ventral hernia 01/24/2016  . Type 2 diabetes mellitus without complication, without long-term current use of insulin ([place]) 10/04/2015  . Obesity (BMI 30-39.9) 10/04/2015  . Dyslipidemia 07/27/2015  . Neuropathic pain of both legs 07/27/2015  . Obesity (BMI 30.0-34.9) 12/24/2014  . Genetic testing 11/01/2014  . Constipation - functional 09/24/2014  . Chemotherapy-induced peripheral neuropathy ([place]) 09/24/2014  . Midline low back pain without sciatica 09/24/2014  . Family history of colon cancer   . Ovarian cancer, right ([place]) 07/08/2014  . Hilar adenopathy   . Essential hypertension   . Pulmonary nodule     Dorene Ar, [place] 01/28/2019, 12:24 PM  [facility] [address] [place], [place], [address] Phone: [phone]   Fax:  [phone]  Name: Tae Robak MRN: [id number] Date of Birth: 1966/05/18

## 2019-02-01 ENCOUNTER — Other Ambulatory Visit: Payer: Self-pay

## 2019-02-01 ENCOUNTER — Encounter: Payer: Self-pay | Admitting: Physical Therapy

## 2019-02-01 ENCOUNTER — Ambulatory Visit: Payer: Medicare Other | Admitting: Physical Therapy

## 2019-02-01 DIAGNOSIS — M545 Low back pain: Secondary | ICD-10-CM | POA: Diagnosis not present

## 2019-02-01 DIAGNOSIS — G8929 Other chronic pain: Secondary | ICD-10-CM | POA: Diagnosis not present

## 2019-02-01 DIAGNOSIS — M546 Pain in thoracic spine: Secondary | ICD-10-CM | POA: Diagnosis not present

## 2019-02-01 DIAGNOSIS — R29898 Other symptoms and signs involving the musculoskeletal system: Secondary | ICD-10-CM | POA: Diagnosis not present

## 2019-02-01 NOTE — Therapy (Signed)
[place], [place], [address] Phone: [phone]   Fax:  [phone]  Physical Therapy Treatment  Patient Details  Name: Sarah [name] MRN: [id number] Date of Birth: [birth year] Referring Provider (PT): Dr. Karle Plumber    Encounter Date: 02/01/2019  PT End of Session - 02/01/19 1151    Visit Number  12    Number of Visits  12    Date for PT Re-Evaluation  02/11/19    Authorization Type  mcr, mcd    Authorization Time Period  PN visit 45     KX at visit 15    PT Start Time  1145    PT Stop Time  1225    PT Time Calculation (min)  40 min       Past Medical History:  Diagnosis Date  . Arthritis   . Family history of colon cancer   . Glaucoma 02/16  . Glucagonoma 07/28/14   Pt denies this but states she has glaucoma  . History of chemotherapy   . Hypertension   . Imbalance   . Neuropathy    feet bilat  . Nocturia   . Peritoneal carcinomatosis ([place])    carcinoma of ovary   . Shortness of breath dyspnea    hx of 2013 - no problems currently   . Thyroid nodule    history of   . Wears glasses     Past Surgical History:  Procedure Laterality Date  . ABDOMINAL HYSTERECTOMY    . FOOT SURGERY  04/2018   [facility]  . INCISIONAL HERNIA REPAIR N/A 07/06/2015   Procedure: INCISIONAL HERNIA REPAIR ;  Surgeon: Rolm Bookbinder, MD;  [facility];  Service: General;  Laterality: N/A;  . INSERTION OF MESH N/A 07/06/2015   Procedure: WITH INSERTION OF PHASIX ST MESH;  Surgeon: Rolm Bookbinder, MD;  [facility];  Service: General;  Laterality: N/A;  . LAPAROTOMY N/A 07/06/2015   Procedure: EXPLORATORY LAPAROTOMY;  Surgeon: Everitt Amber, MD;  [facility]: [facility];  Service: Gynecology;  Laterality: N/A;  . LYSIS OF ADHESION N/A 07/06/2015   Procedure:  LYSIS OF ADHESION RESECTION OF MESENTERIC MASS BOWEL RESECTION ;  Surgeon: Everitt Amber, MD;  [facility];  Service: Gynecology;  Laterality: N/A;   . ROBOTIC ASSISTED TOTAL HYSTERECTOMY WITH BILATERAL SALPINGO OOPHERECTOMY Bilateral 07/28/2014   Procedure: ROBOTIC ASSISTED lysis of adhesions with biopsies, converted to LAPAROTOMY, bilateral salpingoorphorectomy, omentectomy,appendectomy;  Surgeon: Janie Morning, MD;  [facility];  Service: Gynecology;  Laterality: Bilateral;  . THYROIDECTOMY, PARTIAL      There were no vitals filed for this visit.  Subjective Assessment - 02/01/19 1149    Subjective  Stood yesterday at church for 30 minutes and had increased pain in lower back to 10/10.Marland Kitchen No pain now.    Currently in Pain?  No/denies         [facility] PT Assessment - 02/01/19 0001      AROM   Lumbar Extension  feels good, no pain.     Lumbar - Right Side Bend  no pain     Lumbar - Left Side Bend  no pain    Lumbar - Right Rotation  [facility]    Lumbar - Left Rotation  [facility]                    [facility] Adult PT Treatment/Exercise - 02/01/19 0001      Lumbar Exercises: Stretches   Other Lumbar  Stretch Exercise  forward stool rollouts x 3 , modified childs pose.       Lumbar Exercises: Aerobic   Nustep  L5 UE & LE 5 min      Lumbar Exercises: Standing   Row  20 reps    Theraband Level (Row)  Level 3 (Green)    Shoulder Extension  20 reps    Theraband Level (Shoulder Extension)  Level 3 (Green)    Other Standing Lumbar Exercises  Green band : shoulder to shoulder rotation NBOS, antirotation    Other Standing Lumbar Exercises  dead lift 10 # from cybex lower step 10 x 2 then squat kettle bell from floor 10# x 10 with intermittent cues.       Lumbar Exercises: Supine   Bent Knee Raise  20 reps    Bent Knee Raise Limitations  with abdominal draw in     Dead Bug  20 reps    Bridge  20 reps    Bridge with [employer]  --      Knee/Hip Exercises: Stretches   Piriformis Stretch Limitations  supine pull across, push out bilat       Knee/Hip Exercises: Sidelying   Hip ABduction  20 reps    Hip ABduction Limitations   bilat                PT Short Term Goals - 01/21/19 1119      PT SHORT TERM GOAL #1   Title  Pt will be I with initial HEP    Time  3    Period  Weeks    Status  Achieved      PT SHORT TERM GOAL #2   Title  Pt will report less pain overall with ADLs, improved to no more than 6/10 most of the time.    Time  4    Period  Weeks    Status  On-going        PT Long Term Goals - 01/26/19 1006      PT LONG TERM GOAL #1   Title  Pt will score 46% or less on FOTO to demonstrate improved functional mobility    Baseline  50% limited    Status  On-going      PT LONG TERM GOAL #2   Title  Pt will be able to stand to wash dishes, cook for 20 min at a time with min increase pain in back (no more than 5/10)    Baseline  7 minutes    Status  On-going      PT LONG TERM GOAL #3   Title  Pt will demo proper body mechanics for transfers, sitting and standing    Baseline  with cues, able to demo good mechanics but tends to utilize lumbar extension as modification    Status  On-going      PT LONG TERM GOAL #4   Title  pt will be able to walk 2 miles in 25 min on the track to show increased stamina, endurance    Baseline  2 miles on Saturday that took about 45 min due to pain    Status  On-going            Plan - 02/01/19 1153    Clinical Impression Statement  Pt reports improved tolerance to standing during church however pain still reaches 10/10 but subdies more quickly after sitting than at initial eval. She reports most difficutly with washing dishes and standing  at church. Instructed her in hip hinge at sink with option for foot in cabinet. She reports these is helpful. Continued with standing core with increased reps today. She feels increased lower lumbar pain however it decreases after standing extension stretching.    PT Next Visit Plan  challenge walking, STM PRN, extension bias in standing; did she try hip hige with foot in cabinet for dish washing?    PT Home Exercise  Plan  LTR< hamstrgin stretch, knee to chest and PPT, bent knee fall outs, bridge, lateral step up, standing hip abd, chop, antirotation, bent over row, fwd punch with weights       Patient will benefit from skilled therapeutic intervention in order to improve the following deficits and impairments:  Pain, Improper body mechanics, Impaired sensation, Decreased mobility, Postural dysfunction, Obesity, Impaired flexibility, Decreased endurance, Decreased range of motion, Decreased strength  Visit Diagnosis: Pain in thoracic spine  Other symptoms and signs involving the musculoskeletal system  Chronic bilateral low back pain without sciatica     Problem List Patient Active Problem List   Diagnosis Date Noted  . Hiatal hernia with GERD without esophagitis 06/18/2017  . Need for influenza vaccination 02/17/2017  . Ventral hernia 01/24/2016  . Type 2 diabetes mellitus without complication, without long-term current use of insulin ([place]) 10/04/2015  . Obesity (BMI 30-39.9) 10/04/2015  . Dyslipidemia 07/27/2015  . Neuropathic pain of both legs 07/27/2015  . Obesity (BMI 30.0-34.9) 12/24/2014  . Genetic testing 11/01/2014  . Constipation - functional 09/24/2014  . Chemotherapy-induced peripheral neuropathy ([place]) 09/24/2014  . Midline low back pain without sciatica 09/24/2014  . Family history of colon cancer   . Ovarian cancer, right ([place]) 07/08/2014  . Hilar adenopathy   . Essential hypertension   . Pulmonary nodule     Dorene Ar, [place] 02/01/2019, 12:29 PM  [facility] [address] [place], [place], [address] Phone: [phone]   Fax:  [phone]  Name: Sarah [name] MRN: [id number] Date of Birth: [birth year]

## 2019-02-04 ENCOUNTER — Other Ambulatory Visit: Payer: Self-pay

## 2019-02-04 ENCOUNTER — Ambulatory Visit: Payer: Medicare Other | Admitting: Physical Therapy

## 2019-02-04 ENCOUNTER — Encounter: Payer: Self-pay | Admitting: Physical Therapy

## 2019-02-04 DIAGNOSIS — R29898 Other symptoms and signs involving the musculoskeletal system: Secondary | ICD-10-CM

## 2019-02-04 DIAGNOSIS — M546 Pain in thoracic spine: Secondary | ICD-10-CM | POA: Diagnosis not present

## 2019-02-04 DIAGNOSIS — G8929 Other chronic pain: Secondary | ICD-10-CM

## 2019-02-04 DIAGNOSIS — M545 Low back pain: Secondary | ICD-10-CM | POA: Diagnosis not present

## 2019-02-04 NOTE — Therapy (Signed)
[place], [place], [address] Phone: [phone]   Fax:  [phone]  Physical Therapy Treatment/RE-evaluation  Patient Details  Name: Sarah [name] MRN: [id number] Date of Birth: 31-Jul-1965 Referring Provider (PT): Dr. Karle Plumber  (chronic)   Encounter Date: 02/04/2019  PT End of Session - 02/04/19 0933    Visit Number  13    Number of Visits  19    Date for PT Re-Evaluation  03/05/19    Authorization Time Period  PN visit 49     KX at visit 15    PT Start Time  0933    PT Stop Time  1011    PT Time Calculation (min)  38 min    Activity Tolerance  Patient tolerated treatment well    Behavior During Therapy  [facility] for tasks assessed/performed       Past Medical History:  Diagnosis Date  . Arthritis   . Family history of colon cancer   . Glaucoma 02/16  . Glucagonoma 07/28/14   Pt denies this but states she has glaucoma  . History of chemotherapy   . Hypertension   . Imbalance   . Neuropathy    feet bilat  . Nocturia   . Peritoneal carcinomatosis ([place])    carcinoma of ovary   . Shortness of breath dyspnea    hx of 2013 - no problems currently   . Thyroid nodule    history of   . Wears glasses     Past Surgical History:  Procedure Laterality Date  . ABDOMINAL HYSTERECTOMY    . FOOT SURGERY  04/2018   [facility]  . INCISIONAL HERNIA REPAIR N/A 07/06/2015   Procedure: INCISIONAL HERNIA REPAIR ;  Surgeon: Rolm Bookbinder, MD;  [facility];  Service: General;  Laterality: N/A;  . INSERTION OF MESH N/A 07/06/2015   Procedure: WITH INSERTION OF PHASIX ST MESH;  Surgeon: Rolm Bookbinder, MD;  [facility];  Service: General;  Laterality: N/A;  . LAPAROTOMY N/A 07/06/2015   Procedure: EXPLORATORY LAPAROTOMY;  Surgeon: Everitt Amber, MD;  [facility];  Service: Gynecology;  Laterality: N/A;  . LYSIS OF ADHESION N/A 07/06/2015   Procedure:  LYSIS OF ADHESION RESECTION OF  MESENTERIC MASS BOWEL RESECTION ;  Surgeon: Everitt Amber, MD;  [facility];  Service: Gynecology;  Laterality: N/A;  . ROBOTIC ASSISTED TOTAL HYSTERECTOMY WITH BILATERAL SALPINGO OOPHERECTOMY Bilateral 07/28/2014   Procedure: ROBOTIC ASSISTED lysis of adhesions with biopsies, converted to LAPAROTOMY, bilateral salpingoorphorectomy, omentectomy,appendectomy;  Surgeon: Janie Morning, MD;  [facility];  Service: Gynecology;  Laterality: Bilateral;  . THYROIDECTOMY, PARTIAL      There were no vitals filed for this visit.  Subjective Assessment - 02/04/19 0936    Subjective  Not in as much pain as I was. Still have pain sitting and standing for long periods, washing dishes, doing too much, cooking. I don't hurt at all when I walk.    Patient Stated Goals  Pt wants to have less pain    Currently in Pain?  No/denies         [facility] PT Assessment - 02/04/19 0001      Assessment   Medical Diagnosis  chronic low back and mid back pain     Referring Provider (PT)  Dr. Karle Plumber    chronic     Cognition   Overall Cognitive Status  Within Functional Limits for tasks assessed      Observation/Other  Assessments   Focus on Therapeutic Outcomes (FOTO)   53% limited      Sensation   Additional Comments  sharp pain down post LE with flexion activities      Posture/Postural Control   Posture Comments  able to demonstrate proper posture at rest.       AROM   Lumbar Extension  feels good, decreases radicular symptoms    Lumbar - Right Side Bend  no pain     Lumbar - Left Side Bend  no pain    Lumbar - Right Rotation  WFL    Lumbar - Left Rotation  [facility]                    [facility] Adult PT Treatment/Exercise - 02/04/19 0001      Lumbar Exercises: Aerobic   Tread Mill  incline 6 .59m/hr 5 min      Lumbar Exercises: Standing   Other Standing Lumbar Exercises  lower trap set at wall 2lb each hand    Other Standing Lumbar Exercises  weighted extension at counter       Manual Therapy   Soft tissue mobilization  Lt hip/low back             PT Education - 02/04/19 1027    Education Details  goals, POC, HEP, binder, FOTO    Person(s) Educated  Patient    Methods  Explanation    Comprehension  Verbalized understanding       PT Short Term Goals - 01/21/19 1119      PT SHORT TERM GOAL #1   Title  Pt will be I with initial HEP    Time  3    Period  Weeks    Status  Achieved      PT SHORT TERM GOAL #2   Title  Pt will report less pain overall with ADLs, improved to no more than 6/10 most of the time.    Time  4    Period  Weeks    Status  On-going        PT Long Term Goals - 02/04/19 0941      PT LONG TERM GOAL #1   Title  Pt will score 46% or less on FOTO to demonstrate improved functional mobility    Baseline  53% limited    Status  On-going      PT LONG TERM GOAL #2   Title  Pt will be able to stand to wash dishes, cook for 20 min at a time with min increase pain in back (no more than 5/10)    Baseline  severe pain with washing dishes and cooking still, sharp pain down post legs mostly stops at knee.    Status  On-going      PT LONG TERM GOAL #3   Title  Pt will demo proper body mechanics for transfers, sitting and standing    Baseline  significant improvement    Status  Achieved      PT LONG TERM GOAL #4   Title  pt will be able to walk 2 miles in 25 min on the track to show increased stamina, endurance    Baseline  has not been walking    Status  On-going            Plan - 02/04/19 0950    Clinical Impression Statement  requesting frequent standing breaks-set a timer for 5 min and doing a couple of standing extensions  at counter each time. At this time, hernia is limiting ability to progress core strengthenign exercises. We discussed use of an abdominal binder (not back brace) for external support but is going to clear this with MD due to inserted mesh. Pt is showing an overal improvement in pain and tolerance to  exercise but quickly reaches high pain levels and has radicular symptoms-easily corrected with weighted extension. pt will benefit from an extension of PT POC to address endurance with functional activities and progress walking program for exercise. Pt reports plans for permanent mesh insertion for hernia stability and we discussed that we will likely be able to progress core strengthening at that time.    PT Frequency  1x / week    PT Duration  6 weeks    PT Treatment/Interventions  ADLs/Self Care Home Management;Electrical Stimulation;Patient/family education;Therapeutic activities;Taping;Therapeutic exercise;Moist Heat;Functional mobility training;Ultrasound;Manual techniques;Dry needling    PT Next Visit Plan  endurance in walking/upright posture- extension for radicular symptoms. try high kneeling position exercises, test hip strength    PT Home Exercise Plan  LTR< hamstrgin stretch, knee to chest and PPT, bent knee fall outs, bridge, lateral step up, standing hip abd, chop, antirotation, bent over row, fwd punch with weights    Consulted and Agree with Plan of Care  Patient       Patient will benefit from skilled therapeutic intervention in order to improve the following deficits and impairments:  Pain, Improper body mechanics, Impaired sensation, Decreased mobility, Postural dysfunction, Obesity, Impaired flexibility, Decreased endurance, Decreased range of motion, Decreased strength  Visit Diagnosis: Pain in thoracic spine - Plan: PT plan of care cert/re-cert  Other symptoms and signs involving the musculoskeletal system - Plan: PT plan of care cert/re-cert  Chronic bilateral low back pain without sciatica - Plan: PT plan of care cert/re-cert     Problem List Patient Active Problem List   Diagnosis Date Noted  . Hiatal hernia with GERD without esophagitis 06/18/2017  . Need for influenza vaccination 02/17/2017  . Ventral hernia 01/24/2016  . Type 2 diabetes mellitus without  complication, without long-term current use of insulin ([place]) 10/04/2015  . Obesity (BMI 30-39.9) 10/04/2015  . Dyslipidemia 07/27/2015  . Neuropathic pain of both legs 07/27/2015  . Obesity (BMI 30.0-34.9) 12/24/2014  . Genetic testing 11/01/2014  . Constipation - functional 09/24/2014  . Chemotherapy-induced peripheral neuropathy ([place]) 09/24/2014  . Midline low back pain without sciatica 09/24/2014  . Family history of colon cancer   . Ovarian cancer, right ([place]) 07/08/2014  . Hilar adenopathy   . Essential hypertension   . Pulmonary nodule     Kenlynn Houde C. Leilani Cespedes PT, DPT 02/04/19 10:30 AM   [facility] [address] [place], [place], [address] Phone: [phone]   Fax:  [phone]  Name: Loni Kolin MRN: [id number] Date of Birth: 05-20-1966

## 2019-02-09 ENCOUNTER — Ambulatory Visit: Payer: Medicare Other | Admitting: Physical Therapy

## 2019-02-11 ENCOUNTER — Encounter: Payer: Self-pay | Admitting: Physical Therapy

## 2019-02-11 ENCOUNTER — Other Ambulatory Visit: Payer: Self-pay

## 2019-02-11 ENCOUNTER — Ambulatory Visit: Payer: Medicare Other | Attending: Internal Medicine | Admitting: Physical Therapy

## 2019-02-11 DIAGNOSIS — R29898 Other symptoms and signs involving the musculoskeletal system: Secondary | ICD-10-CM

## 2019-02-11 DIAGNOSIS — G8929 Other chronic pain: Secondary | ICD-10-CM | POA: Diagnosis not present

## 2019-02-11 DIAGNOSIS — M545 Low back pain: Secondary | ICD-10-CM | POA: Diagnosis not present

## 2019-02-11 DIAGNOSIS — M546 Pain in thoracic spine: Secondary | ICD-10-CM

## 2019-02-11 NOTE — Therapy (Signed)
[place], [place], [address] Phone: [phone]   Fax:  [phone]  Physical Therapy Treatment  Patient Details  Name: Sarah [name] MRN: [id number] Date of Birth: Mar 21, 1966 Referring Provider (PT): Dr. Karle Plumber  (chronic)   Encounter Date: 02/11/2019  PT End of Session - 02/11/19 0935    Visit Number  14    Number of Visits  19    Date for PT Re-Evaluation  03/05/19    Authorization Type  mcr, mcd    Authorization Time Period  PN visit 37     KX at visit 15    PT Start Time  0930    PT Stop Time  1010    PT Time Calculation (min)  40 min    Activity Tolerance  Patient tolerated treatment well    Behavior During Therapy  [facility] for tasks assessed/performed       Past Medical History:  Diagnosis Date  . Arthritis   . Family history of colon cancer   . Glaucoma 02/16  . Glucagonoma 07/28/14   Pt denies this but states she has glaucoma  . History of chemotherapy   . Hypertension   . Imbalance   . Neuropathy    feet bilat  . Nocturia   . Peritoneal carcinomatosis ([place])    carcinoma of ovary   . Shortness of breath dyspnea    hx of 2013 - no problems currently   . Thyroid nodule    history of   . Wears glasses     Past Surgical History:  Procedure Laterality Date  . ABDOMINAL HYSTERECTOMY    . FOOT SURGERY  04/2018   [facility]  . INCISIONAL HERNIA REPAIR N/A 07/06/2015   Procedure: INCISIONAL HERNIA REPAIR ;  Surgeon: Rolm Bookbinder, MD;  [facility];  Service: General;  Laterality: N/A;  . INSERTION OF MESH N/A 07/06/2015   Procedure: WITH INSERTION OF PHASIX ST MESH;  Surgeon: Rolm Bookbinder, MD;  [facility];  Service: General;  Laterality: N/A;  . LAPAROTOMY N/A 07/06/2015   Procedure: EXPLORATORY LAPAROTOMY;  Surgeon: Everitt Amber, MD;  [facility];  Service: Gynecology;  Laterality: N/A;  . LYSIS OF ADHESION N/A 07/06/2015   Procedure:  LYSIS OF ADHESION  RESECTION OF MESENTERIC MASS BOWEL RESECTION ;  Surgeon: Everitt Amber, MD;  [facility];  Service: Gynecology;  Laterality: N/A;  . ROBOTIC ASSISTED TOTAL HYSTERECTOMY WITH BILATERAL SALPINGO OOPHERECTOMY Bilateral 07/28/2014   Procedure: ROBOTIC ASSISTED lysis of adhesions with biopsies, converted to LAPAROTOMY, bilateral salpingoorphorectomy, omentectomy,appendectomy;  Surgeon: Janie Morning, MD;  [facility];  Service: Gynecology;  Laterality: Bilateral;  . THYROIDECTOMY, PARTIAL      There were no vitals filed for this visit.  Subjective Assessment - 02/11/19 0933    Subjective  Got the abdominal binder. Used extensions to decrease pain. Some pain in legs- they are sore. I walked Tuesday and Wednesday.    Currently in Pain?  Yes    Pain Location  Leg    Pain Orientation  Right;Left    Pain Descriptors / Indicators  Sore         [facility] PT Assessment - 02/11/19 0001      Strength   Right/Left Hip  Right;Left    Right Hip Flexion  5/5    Right Hip ABduction  5/5    Left Hip Flexion  4+/5    Left Hip ABduction  4+/5  [place] Adult PT Treatment/Exercise - 02/11/19 0001      Lumbar Exercises: Stretches   Passive Hamstring Stretch Limitations  seated EOB    Hip Flexor Stretch Limitations  kneeling lunge      Lumbar Exercises: Aerobic   Nustep  L5 5 min UE & LE      Lumbar Exercises: Standing   Other Standing Lumbar Exercises  high kneeling- balance, red plyoball press, chops      Lumbar Exercises: Quadruped   Single Arm Raise  Right;Left;5 reps;5 seconds    Other Quadruped Lumbar Exercises  rocking- knees on airex      Knee/Hip Exercises: Standing   SLS  cues for hip lift, chair for balance assist      Manual Therapy   Soft tissue mobilization  Rt hip/low back               PT Short Term Goals - 01/21/19 1119      PT SHORT TERM GOAL #1   Title  Pt will be I with initial HEP    Time  3    Period  Weeks    Status  Achieved       PT SHORT TERM GOAL #2   Title  Pt will report less pain overall with ADLs, improved to no more than 6/10 most of the time.    Time  4    Period  Weeks    Status  On-going        PT Long Term Goals - 02/04/19 0941      PT LONG TERM GOAL #1   Title  Pt will score 46% or less on FOTO to demonstrate improved functional mobility    Baseline  53% limited    Status  On-going      PT LONG TERM GOAL #2   Title  Pt will be able to stand to wash dishes, cook for 20 min at a time with min increase pain in back (no more than 5/10)    Baseline  severe pain with washing dishes and cooking still, sharp pain down post legs mostly stops at knee.    Status  On-going      PT LONG TERM GOAL #3   Title  Pt will demo proper body mechanics for transfers, sitting and standing    Baseline  significant improvement    Status  Achieved      PT LONG TERM GOAL #4   Title  pt will be able to walk 2 miles in 25 min on the track to show increased stamina, endurance    Baseline  has not been walking    Status  On-going            Plan - 02/11/19 0935    Clinical Impression Statement  Challenged in half kneeling position due to hip fatigue. Abdominal brace is very helpful when doing dishes. Required UE assist for balance indicating need for core/hip training in balance.    PT Treatment/Interventions  ADLs/Self Care Home Management;Electrical Stimulation;Patient/family education;Therapeutic activities;Taping;Therapeutic exercise;Moist Heat;Functional mobility training;Ultrasound;Manual techniques;Dry needling    PT Next Visit Plan  KX on charges    PT Home Exercise Plan  LTR< hamstrgin stretch, knee to chest and PPT, bent knee fall outs, bridge, lateral step up, standing hip abd, chop, antirotation, bent over row, fwd punch with weights    Consulted and Agree with Plan of Care  Patient       Patient will benefit from skilled therapeutic intervention  in order to improve the following deficits and  impairments:  Pain, Improper body mechanics, Impaired sensation, Decreased mobility, Postural dysfunction, Obesity, Impaired flexibility, Decreased endurance, Decreased range of motion, Decreased strength  Visit Diagnosis: Pain in thoracic spine  Other symptoms and signs involving the musculoskeletal system  Chronic bilateral low back pain without sciatica     Problem List Patient Active Problem List   Diagnosis Date Noted  . Hiatal hernia with GERD without esophagitis 06/18/2017  . Need for influenza vaccination 02/17/2017  . Ventral hernia 01/24/2016  . Type 2 diabetes mellitus without complication, without long-term current use of insulin ([place]) 10/04/2015  . Obesity (BMI 30-39.9) 10/04/2015  . Dyslipidemia 07/27/2015  . Neuropathic pain of both legs 07/27/2015  . Obesity (BMI 30.0-34.9) 12/24/2014  . Genetic testing 11/01/2014  . Constipation - functional 09/24/2014  . Chemotherapy-induced peripheral neuropathy ([place]) 09/24/2014  . Midline low back pain without sciatica 09/24/2014  . Family history of colon cancer   . Ovarian cancer, right ([place]) 07/08/2014  . Hilar adenopathy   . Essential hypertension   . Pulmonary nodule    Shoua Ulloa C. Ji Feldner PT, DPT 02/11/19 11:00 AM   [place], [place], [address] Phone: [phone]   Fax:  [phone]  Name: Sarah [name] MRN: [id number] Date of Birth: 23-Mar-1966

## 2019-02-22 ENCOUNTER — Other Ambulatory Visit: Payer: Self-pay | Admitting: Internal Medicine

## 2019-02-22 DIAGNOSIS — I10 Essential (primary) hypertension: Secondary | ICD-10-CM

## 2019-02-25 ENCOUNTER — Ambulatory Visit: Payer: Medicare Other | Admitting: Physical Therapy

## 2019-02-25 ENCOUNTER — Other Ambulatory Visit: Payer: Self-pay

## 2019-02-25 ENCOUNTER — Encounter: Payer: Self-pay | Admitting: Physical Therapy

## 2019-02-25 DIAGNOSIS — R29898 Other symptoms and signs involving the musculoskeletal system: Secondary | ICD-10-CM

## 2019-02-25 DIAGNOSIS — G8929 Other chronic pain: Secondary | ICD-10-CM | POA: Diagnosis not present

## 2019-02-25 DIAGNOSIS — M546 Pain in thoracic spine: Secondary | ICD-10-CM

## 2019-02-25 DIAGNOSIS — M545 Low back pain: Secondary | ICD-10-CM | POA: Diagnosis not present

## 2019-02-25 NOTE — Therapy (Signed)
[place], [place], [address] Phone: [phone]   Fax:  [phone]  Physical Therapy Treatment  Patient Details  Name: Sarah [name] MRN: [id number] Date of Birth: [birth year] Referring Provider (PT): Dr. Karle Plumber  (chronic)   Encounter Date: 02/25/2019  PT End of Session - 02/25/19 1157    Visit Number  15    Number of Visits  19    Date for PT Re-Evaluation  03/05/19    PT Start Time  [id number]    PT Stop Time  1230    PT Time Calculation (min)  39 min       Past Medical History:  Diagnosis Date  . Arthritis   . Family history of colon cancer   . Glaucoma 02/16  . Glucagonoma 07/28/14   Pt denies this but states she has glaucoma  . History of chemotherapy   . Hypertension   . Imbalance   . Neuropathy    feet bilat  . Nocturia   . Peritoneal carcinomatosis ([place])    carcinoma of ovary   . Shortness of breath dyspnea    hx of 2013 - no problems currently   . Thyroid nodule    history of   . Wears glasses     Past Surgical History:  Procedure Laterality Date  . ABDOMINAL HYSTERECTOMY    . FOOT SURGERY  04/2018   [facility]  . INCISIONAL HERNIA REPAIR N/A 07/06/2015   Procedure: INCISIONAL HERNIA REPAIR ;  Surgeon: Rolm Bookbinder, MD;  Location: [facility];  Service: General;  Laterality: N/A;  . INSERTION OF MESH N/A 07/06/2015   Procedure: WITH INSERTION OF PHASIX ST MESH;  Surgeon: Rolm Bookbinder, MD;  Location: [facility];  Service: General;  Laterality: N/A;  . LAPAROTOMY N/A 07/06/2015   Procedure: EXPLORATORY LAPAROTOMY;  Surgeon: Everitt Amber, MD;  Location: [facility];  Service: Gynecology;  Laterality: N/A;  . LYSIS OF ADHESION N/A 07/06/2015   Procedure:  LYSIS OF ADHESION RESECTION OF MESENTERIC MASS BOWEL RESECTION ;  Surgeon: Everitt Amber, MD;  Location: [facility];  Service: Gynecology;  Laterality: N/A;  . ROBOTIC ASSISTED TOTAL HYSTERECTOMY WITH BILATERAL SALPINGO OOPHERECTOMY  Bilateral 07/28/2014   Procedure: ROBOTIC ASSISTED lysis of adhesions with biopsies, converted to LAPAROTOMY, bilateral salpingoorphorectomy, omentectomy,appendectomy;  Surgeon: Janie Morning, MD;  Location: [facility];  Service: Gynecology;  Laterality: Bilateral;  . THYROIDECTOMY, PARTIAL      There were no vitals filed for this visit.  Subjective Assessment - 02/25/19 1157    Subjective  Walking a mile and a half 5 times a week.    Currently in Pain?  No/denies                       [facility] Adult PT Treatment/Exercise - 02/25/19 0001      Lumbar Exercises: Aerobic   Nustep  L5 5 min UE & LE      Lumbar Exercises: Standing   Other Standing Lumbar Exercises  high kneeling- balance, red plyoball press, chops    Other Standing Lumbar Exercises  SLS with intermittent touch needed , tandem trails, tandem gait tandem on faom, blue balance rocker - all with cues to engage abdominals      Lumbar Exercises: Supine   Bent Knee Raise  20 reps    Dead Bug  20 reps      Lumbar Exercises: Quadruped   Single Arm Raise  20 reps  Opposite Arm/Leg Raise  20 reps               PT Short Term Goals - 01/21/19 1119      PT SHORT TERM GOAL #1   Title  Pt will be I with initial HEP    Time  3    Period  Weeks    Status  Achieved      PT SHORT TERM GOAL #2   Title  Pt will report less pain overall with ADLs, improved to no more than 6/10 most of the time.    Time  4    Period  Weeks    Status  On-going        PT Long Term Goals - 02/04/19 0941      PT LONG TERM GOAL #1   Title  Pt will score 46% or less on FOTO to demonstrate improved functional mobility    Baseline  53% limited    Status  On-going      PT LONG TERM GOAL #2   Title  Pt will be able to stand to wash dishes, cook for 20 min at a time with min increase pain in back (no more than 5/10)    Baseline  severe pain with washing dishes and cooking still, sharp pain down post legs mostly stops at knee.     Status  On-going      PT LONG TERM GOAL #3   Title  Pt will demo proper body mechanics for transfers, sitting and standing    Baseline  significant improvement    Status  Achieved      PT LONG TERM GOAL #4   Title  pt will be able to walk 2 miles in 25 min on the track to show increased stamina, endurance    Baseline  has not been walking    Status  On-going            Plan - 02/25/19 1237    Clinical Impression Statement  Pt reports abdominal brace has been very helpful with her pain. She still has pain at night when she takes it off. Challenged core strength in kneeling and standing today with balcne challenges. No increased pain during treatment.    PT Next Visit Plan  KX on charges    PT Home Exercise Plan  LTR< hamstrgin stretch, knee to chest and PPT, bent knee fall outs, bridge, lateral step up, standing hip abd, chop, antirotation, bent over row, fwd punch with weights       Patient will benefit from skilled therapeutic intervention in order to improve the following deficits and impairments:  Pain, Improper body mechanics, Impaired sensation, Decreased mobility, Postural dysfunction, Obesity, Impaired flexibility, Decreased endurance, Decreased range of motion, Decreased strength  Visit Diagnosis: Pain in thoracic spine  Other symptoms and signs involving the musculoskeletal system  Chronic bilateral low back pain without sciatica     Problem List Patient Active Problem List   Diagnosis Date Noted  . Hiatal hernia with GERD without esophagitis 06/18/2017  . Need for influenza vaccination 02/17/2017  . Ventral hernia 01/24/2016  . Type 2 diabetes mellitus without complication, without long-term current use of insulin ([place]) 10/04/2015  . Obesity (BMI 30-39.9) 10/04/2015  . Dyslipidemia 07/27/2015  . Neuropathic pain of both legs 07/27/2015  . Obesity (BMI 30.0-34.9) 12/24/2014  . Genetic testing 11/01/2014  . Constipation - functional 09/24/2014  .  Chemotherapy-induced peripheral neuropathy ([place]) 09/24/2014  . Midline low back  pain without sciatica 09/24/2014  . Family history of colon cancer   . Ovarian cancer, right ([place]) 07/08/2014  . Hilar adenopathy   . Essential hypertension   . Pulmonary nodule     Dorene Ar, [place] 02/25/2019, 12:40 PM  [facility] [address] [place], [place], [address] Phone: [phone]   Fax:  [phone]  Name: Sarah [name] MRN: [id number] Date of Birth: 19-Aug-1965

## 2019-03-04 ENCOUNTER — Other Ambulatory Visit: Payer: Self-pay

## 2019-03-04 ENCOUNTER — Ambulatory Visit: Payer: Medicare Other | Admitting: Physical Therapy

## 2019-03-04 ENCOUNTER — Encounter: Payer: Self-pay | Admitting: Physical Therapy

## 2019-03-04 DIAGNOSIS — M545 Low back pain, unspecified: Secondary | ICD-10-CM

## 2019-03-04 DIAGNOSIS — M546 Pain in thoracic spine: Secondary | ICD-10-CM | POA: Diagnosis not present

## 2019-03-04 DIAGNOSIS — R29898 Other symptoms and signs involving the musculoskeletal system: Secondary | ICD-10-CM | POA: Diagnosis not present

## 2019-03-04 DIAGNOSIS — G8929 Other chronic pain: Secondary | ICD-10-CM

## 2019-03-04 NOTE — Therapy (Addendum)
[place], [place], [address] Phone: [phone]   Fax:  [phone]  Physical Therapy Treatment/Discharge  Patient Details  Name: Sarah [name] MRN: [id number] Date of Birth: February 06, 1966 Referring Provider (PT): Dr. Karle Plumber  (chronic)   Encounter Date: 03/04/2019  PT End of Session - 03/04/19 0954    Visit Number  16    Number of Visits  19    Date for PT Re-Evaluation  03/05/19    Authorization Type  mcr, mcd    Authorization Time Period  PN visit 20     KX at visit 15    PT Start Time  0930    PT Stop Time  1013    PT Time Calculation (min)  43 min       Past Medical History:  Diagnosis Date  . Arthritis   . Family history of colon cancer   . Glaucoma 02/16  . Glucagonoma 07/28/14   Pt denies this but states she has glaucoma  . History of chemotherapy   . Hypertension   . Imbalance   . Neuropathy    feet bilat  . Nocturia   . Peritoneal carcinomatosis ([place])    carcinoma of ovary   . Shortness of breath dyspnea    hx of 2013 - no problems currently   . Thyroid nodule    history of   . Wears glasses     Past Surgical History:  Procedure Laterality Date  . ABDOMINAL HYSTERECTOMY    . FOOT SURGERY  04/2018   [facility]  . INCISIONAL HERNIA REPAIR N/A 07/06/2015   Procedure: INCISIONAL HERNIA REPAIR ;  Surgeon: Rolm Bookbinder, MD;  Location: [facility];  Service: General;  Laterality: N/A;  . INSERTION OF MESH N/A 07/06/2015   Procedure: WITH INSERTION OF PHASIX ST MESH;  Surgeon: Rolm Bookbinder, MD;  Location: [facility];  Service: General;  Laterality: N/A;  . LAPAROTOMY N/A 07/06/2015   Procedure: EXPLORATORY LAPAROTOMY;  Surgeon: Everitt Amber, MD;  Location: [facility];  Service: Gynecology;  Laterality: N/A;  . LYSIS OF ADHESION N/A 07/06/2015   Procedure:  LYSIS OF ADHESION RESECTION OF MESENTERIC MASS BOWEL RESECTION ;  Surgeon: Everitt Amber, MD;  Location: [facility];  Service:  Gynecology;  Laterality: N/A;  . ROBOTIC ASSISTED TOTAL HYSTERECTOMY WITH BILATERAL SALPINGO OOPHERECTOMY Bilateral 07/28/2014   Procedure: ROBOTIC ASSISTED lysis of adhesions with biopsies, converted to LAPAROTOMY, bilateral salpingoorphorectomy, omentectomy,appendectomy;  Surgeon: Janie Morning, MD;  Location: [facility];  Service: Gynecology;  Laterality: Bilateral;  . THYROIDECTOMY, PARTIAL      There were no vitals filed for this visit.  Subjective Assessment - 03/04/19 0934    Subjective  No pain now. I am still walking 5 times per week.    Currently in Pain?  No/denies                       [facility] Adult PT Treatment/Exercise - 03/04/19 0001      Lumbar Exercises: Stretches   Single Knee to Chest Stretch  2 reps    Lower Trunk Rotation  10 seconds      Lumbar Exercises: Aerobic   Nustep  L5 5 min UE & LE      Lumbar Exercises: Standing   Row  20 reps    Theraband Level (Row)  Level 3 (Green)    Row Limitations  also bent over row, blue band     Other Standing  Lumbar Exercises  split kneeling with wooden dowel for assist in narrow stance -unable to release dowel, regulat stance in split kneeling with head turns, occasional UE support required.     Other Standing Lumbar Exercises  tandem and SLS       Lumbar Exercises: Supine   Bent Knee Raise  20 reps    Straight Leg Raise  10 reps      Lumbar Exercises: Quadruped   Single Arm Raise  10 reps    Opposite Arm/Leg Raise  10 reps      Knee/Hip Exercises: Standing   Hip Abduction  Right;Left;20 reps    Lateral Step Up  15 reps;Hand Hold: 1;Step Height: 4"               PT Short Term Goals - 03/04/19 0935      PT SHORT TERM GOAL #1   Title  Pt will be I with initial HEP    Time  3    Period  Weeks    Status  Achieved      PT SHORT TERM GOAL #2   Title  Pt will report less pain overall with ADLs, improved to no more than 6/10 most of the time.    Baseline  cooking and washing dishes can be 10/10  pain with 3-4 minutes.    Time  4    Period  Weeks    Status  Not Met        PT Long Term Goals - 03/04/19 9476      PT LONG TERM GOAL #1   Title  Pt will score 46% or less on FOTO to demonstrate improved functional mobility    Baseline  47% limited at DC    Time  6    Period  Weeks    Status  Partially Met      PT LONG TERM GOAL #2   Title  Pt will be able to stand to wash dishes, cook for 20 min at a time with min increase pain in back (no more than 5/10)    Baseline  severe pain with washing dishes and cooking still, sharp pain down post legs mostly stops at knee.    Time  6    Period  Weeks    Status  Not Met      PT LONG TERM GOAL #3   Title  Pt will demo proper body mechanics for transfers, sitting and standing    Baseline  significant improvement    Time  6    Period  Weeks    Status  Achieved      PT LONG TERM GOAL #4   Title  pt will be able to walk 2 miles in 25 min on the track to show increased stamina, endurance    Baseline  15 minutes for 1.5 miles    Time  6    Period  Weeks    Status  Achieved            Plan - 03/04/19 1010    Clinical Impression Statement  Pt reports abdominal brace has helped her pain with standing activites and walking significantly. SHe was able to stand in clinic today and perform Core HEP without increased pain, about 10 minutes, while wearing lumbar brace. She reports continued severe pain with cooking  and washing dishes after 3-4 minutes. I encouarged her to try er abdominal brace with these activites. She has improved her FOTO score within 1%  of her prediction. She is walking 1.5 miles in 15 minutes 5 times per week. She has met or partially met most goals. She is agreeable to DC to HEP today. Time spent reviewing HEP and she is independent.       Patient will benefit from skilled therapeutic intervention in order to improve the following deficits and impairments:  Pain, Improper body mechanics, Impaired sensation,  Decreased mobility, Postural dysfunction, Obesity, Impaired flexibility, Decreased endurance, Decreased range of motion, Decreased strength  Visit Diagnosis: Pain in thoracic spine  Other symptoms and signs involving the musculoskeletal system  Chronic bilateral low back pain without sciatica     Problem List Patient Active Problem List   Diagnosis Date Noted  . Hiatal hernia with GERD without esophagitis 06/18/2017  . Need for influenza vaccination 02/17/2017  . Ventral hernia 01/24/2016  . Type 2 diabetes mellitus without complication, without long-term current use of insulin ([place]) 10/04/2015  . Obesity (BMI 30-39.9) 10/04/2015  . Dyslipidemia 07/27/2015  . Neuropathic pain of both legs 07/27/2015  . Obesity (BMI 30.0-34.9) 12/24/2014  . Genetic testing 11/01/2014  . Constipation - functional 09/24/2014  . Chemotherapy-induced peripheral neuropathy ([place]) 09/24/2014  . Midline low back pain without sciatica 09/24/2014  . Family history of colon cancer   . Ovarian cancer, right ([place]) 07/08/2014  . Hilar adenopathy   . Essential hypertension   . Pulmonary nodule     Dorene Ar, [place] 03/04/2019, 11:12 AM  [facility] [address] [place], [place], [address] Phone: [phone]   Fax:  [phone]  Name: Sarah [name] MRN: [id number] Date of Birth: 06/30/1965  PHYSICAL THERAPY DISCHARGE SUMMARY  Visits from Start of Care: 16  Current functional level related to goals / functional outcomes: See above   Remaining deficits: See above   Education / Equipment: Anatomy of condition, POC, HEP, exercise form/rationale  Plan: Patient agrees to discharge.  Patient goals were partially met. Patient is being discharged due to lack of progress.  ?????   Has made significant progress but has reached a plateau- she will continue strengthening at this level independently with HEP for a few months.    Judith Campillo C.  Hightower PT, DPT 03/04/19 1:19 PM

## 2019-03-08 ENCOUNTER — Ambulatory Visit: Payer: Medicare Other | Admitting: Internal Medicine

## 2019-03-18 ENCOUNTER — Encounter: Payer: Medicare Other | Admitting: Physical Therapy

## 2019-03-22 ENCOUNTER — Other Ambulatory Visit: Payer: Self-pay

## 2019-03-22 ENCOUNTER — Encounter: Payer: Self-pay | Admitting: Internal Medicine

## 2019-03-22 ENCOUNTER — Ambulatory Visit (HOSPITAL_BASED_OUTPATIENT_CLINIC_OR_DEPARTMENT_OTHER): Payer: Medicare Other | Admitting: Pharmacist

## 2019-03-22 ENCOUNTER — Ambulatory Visit: Payer: Medicare Other | Attending: Internal Medicine | Admitting: Internal Medicine

## 2019-03-22 VITALS — BP 130/80 | HR 74 | Temp 98.2°F | Resp 16 | Ht 65.0 in | Wt 224.6 lb

## 2019-03-22 DIAGNOSIS — Z6830 Body mass index (BMI) 30.0-30.9, adult: Secondary | ICD-10-CM | POA: Insufficient documentation

## 2019-03-22 DIAGNOSIS — K439 Ventral hernia without obstruction or gangrene: Secondary | ICD-10-CM

## 2019-03-22 DIAGNOSIS — IMO0002 Reserved for concepts with insufficient information to code with codable children: Secondary | ICD-10-CM

## 2019-03-22 DIAGNOSIS — R911 Solitary pulmonary nodule: Secondary | ICD-10-CM | POA: Insufficient documentation

## 2019-03-22 DIAGNOSIS — E669 Obesity, unspecified: Secondary | ICD-10-CM | POA: Diagnosis not present

## 2019-03-22 DIAGNOSIS — M545 Low back pain: Secondary | ICD-10-CM

## 2019-03-22 DIAGNOSIS — I10 Essential (primary) hypertension: Secondary | ICD-10-CM | POA: Diagnosis not present

## 2019-03-22 DIAGNOSIS — G620 Drug-induced polyneuropathy: Secondary | ICD-10-CM | POA: Diagnosis not present

## 2019-03-22 DIAGNOSIS — Z23 Encounter for immunization: Secondary | ICD-10-CM | POA: Diagnosis not present

## 2019-03-22 DIAGNOSIS — R590 Localized enlarged lymph nodes: Secondary | ICD-10-CM | POA: Diagnosis not present

## 2019-03-22 DIAGNOSIS — E1165 Type 2 diabetes mellitus with hyperglycemia: Secondary | ICD-10-CM | POA: Diagnosis not present

## 2019-03-22 DIAGNOSIS — Z79899 Other long term (current) drug therapy: Secondary | ICD-10-CM | POA: Insufficient documentation

## 2019-03-22 DIAGNOSIS — K449 Diaphragmatic hernia without obstruction or gangrene: Secondary | ICD-10-CM | POA: Diagnosis not present

## 2019-03-22 DIAGNOSIS — E1142 Type 2 diabetes mellitus with diabetic polyneuropathy: Secondary | ICD-10-CM | POA: Insufficient documentation

## 2019-03-22 DIAGNOSIS — Z6837 Body mass index (BMI) 37.0-37.9, adult: Secondary | ICD-10-CM | POA: Diagnosis not present

## 2019-03-22 DIAGNOSIS — G8929 Other chronic pain: Secondary | ICD-10-CM | POA: Insufficient documentation

## 2019-03-22 DIAGNOSIS — Z794 Long term (current) use of insulin: Secondary | ICD-10-CM | POA: Diagnosis not present

## 2019-03-22 DIAGNOSIS — Z8249 Family history of ischemic heart disease and other diseases of the circulatory system: Secondary | ICD-10-CM | POA: Diagnosis not present

## 2019-03-22 DIAGNOSIS — E785 Hyperlipidemia, unspecified: Secondary | ICD-10-CM | POA: Diagnosis not present

## 2019-03-22 LAB — POCT GLYCOSYLATED HEMOGLOBIN (HGB A1C): HbA1c, POC (controlled diabetic range): 9.3 % — AB (ref 0.0–7.0)

## 2019-03-22 LAB — GLUCOSE, POCT (MANUAL RESULT ENTRY): POC Glucose: 221 mg/dl — AB (ref 70–99)

## 2019-03-22 MED ORDER — AMLODIPINE BESYLATE 10 MG PO TABS: 10.0000 mg | ORAL_TABLET | Freq: Every day | ORAL | 3 refills | Status: DC

## 2019-03-22 MED ORDER — LANTUS SOLOSTAR 100 UNIT/ML ~~LOC~~ SOPN: 15.0000 [IU] | PEN_INJECTOR | Freq: Every day | SUBCUTANEOUS | 6 refills | Status: DC

## 2019-03-22 NOTE — Progress Notes (Signed)
Patient presents for vaccination against influenza per orders of Dr. Johnson. Consent given. Counseling provided. No contraindications exists. Vaccine administered without incident.   

## 2019-03-22 NOTE — Progress Notes (Signed)
Patient ID: Sarah [name], female    DOB: [birth year]  MRN: [id number]  CC: Hypertension and Diabetes   Subjective: Sarah [name] is a 53 y.o. female who presents for chronic ds management Her concerns today include:  DM with peripheral neuropathy (more so due to chemo), HTN, HL, OSA, midline LBP,stage IIIc low-grade serous carcinoma of the ovaryS/p BSO, omentectomy, appendectomy on 07/28/14. S/p adjuvant chemotherapy with 6 cycles of paclitaxel and carboplatin completed on 12/08/14. S/p an ex lap, hernia repair and small bowel resection for a presumed recurrence (benign pathology).    Chronic Back Pain:  Did P.T and found it helpful.  HYPERTENSION Currently taking: see medication list.  She is on metoprolol, hydralazine, hydrochlorothiazide, amlodipine and Cozaar Med Adherence: [x]  Yes    []  No Medication side effects: []  Yes    []  No Adherence with salt restriction: [x]  Yes    []  No Home Monitoring?: []  Yes    [x]  No Monitoring Frequency: []  Yes    []  No Home BP results range: []  Yes    []  No SOB? []  Yes    [x]  No Chest Pain?: []  Yes    [x]  No Leg swelling?: []  Yes    [x]  No Headaches?: []  Yes    [x]  No Dizziness? []  Yes    [x]  No Comments:   DIABETES TYPE 2 Last A1C:   Results for orders placed or performed in visit on 03/22/19  POCT glucose (manual entry)  Result Value Ref Range   POC Glucose 221 (A) 70 - 99 mg/dl  POCT glycosylated hemoglobin (Hb A1C)  Result Value Ref Range   Hemoglobin A1C     HbA1c POC (<> result, manual entry)     HbA1c, POC (prediabetic range)     HbA1c, POC (controlled diabetic range) 9.3 (A) 0.0 - 7.0 %    Med Adherence:  [x]  Yes    []  No Medication side effects:  []  Yes    [x]  No Home Monitoring?  [x]  Yes  -TID before meals.  Does not have log Home glucose results range: morning range 140-221, before lunch 150-160, dinner 120-130 Diet Adherence: [x]  Yes    []  No Exercise: [x]  Yes  -walking 1.5 miles 5 days a wk Hypoglycemic episodes?: []   Yes    [x]  No Numbness of the feet? []  Yes no numbness but tingling in the feet at nights     Retinopathy hx? []  Yes    []  No Last eye exam:  Has appt 04/06/2019 with [facility] Dr. Sherral Hammers Comments:   HyperCa+: Calcium elevated on last chemistry.  She was to come back to have further blood test done including parathyroid hormone level.  Of note she is on HCTZ  Ventral hernia:  Has app 05/04/2019 with the surgeon.    Patient Active Problem List   Diagnosis Date Noted  . Hiatal hernia with GERD without esophagitis 06/18/2017  . Need for influenza vaccination 02/17/2017  . Ventral hernia 01/24/2016  . Type 2 diabetes mellitus without complication, without long-term current use of insulin ([place]) 10/04/2015  . Obesity (BMI 30-39.9) 10/04/2015  . Dyslipidemia 07/27/2015  . Neuropathic pain of both legs 07/27/2015  . Obesity (BMI 30.0-34.9) 12/24/2014  . Genetic testing 11/01/2014  . Constipation - functional 09/24/2014  . Chemotherapy-induced peripheral neuropathy ([place]) 09/24/2014  . Midline low back pain without sciatica 09/24/2014  . Family history of colon cancer   . Ovarian cancer, right ([place]) 07/08/2014  . Hilar adenopathy   .  Essential hypertension   . Pulmonary nodule      Current Outpatient Medications on File Prior to Visit  Medication Sig Dispense Refill  . ACCU-CHEK SMARTVIEW test strip TID and a bedtime. E11.9 100 each 12  . amLODipine (NORVASC) 10 MG tablet Take 1 tablet (10 mg total) by mouth daily. 90 tablet 3  . blood glucose meter kit and supplies Dispense based on patient and insurance preference. Use up to four times daily as directed. (FOR ICD-9 250.00, 250.01). 1 each 0  . diclofenac sodium (VOLTAREN) 1 % GEL Apply 2 g topically 4 (four) times daily. 100 g 3  . gabapentin (NEURONTIN) 300 MG capsule TAKE 1 CAPSULE BY MOUTH EVERY MORNING AND 1 CAPSULE AT NOON , AND TWO CAPSULES AT BEDTIME 120 capsule 0  . glimepiride (AMARYL) 4 MG tablet Take 1 tablet (4 mg  total) by mouth daily with breakfast. 90 tablet 0  . hydrALAZINE (APRESOLINE) 25 MG tablet Take 1 tablet (25 mg total) by mouth 3 (three) times daily. 360 tablet 1  . hydrochlorothiazide (HYDRODIURIL) 25 MG tablet Take 1.5 tablets (37.5 mg total) by mouth daily. 135 tablet 3  . Insulin Glargine (LANTUS SOLOSTAR) 100 UNIT/ML Solostar Pen Inject 10 Units into the skin daily. 5 pen 6  . Insulin Pen Needle 29G X 12MM MISC Use as directed. 100 each 6  . Lancets (ACCU-CHEK MULTICLIX) lancets   0  . latanoprost (XALATAN) 0.005 % ophthalmic solution Place 1 drop into both eyes at bedtime.    Marland Kitchen losartan (COZAAR) 50 MG tablet Take 1 tablet (50 mg total) by mouth daily. 90 tablet 0  . metFORMIN (GLUCOPHAGE) 1000 MG tablet Take 1 tablet (1,000 mg total) by mouth 2 (two) times daily with a meal. 180 tablet 3  . metoprolol tartrate (LOPRESSOR) 25 MG tablet TAKE 1/2 TABLET BY MOUTH 2 TIMES A DAY 90 tablet 3  . rosuvastatin (CRESTOR) 20 MG tablet Take 1 tablet (20 mg total) by mouth daily. Stop Atorvastatin 90 tablet 2  . timolol (TIMOPTIC) 0.5 % ophthalmic solution Place 1 drop into both eyes daily.     No current facility-administered medications on file prior to visit.     Allergies  Allergen Reactions  . Emend [Aprepitant] Other (See Comments)    Urticaria   . Lisinopril Cough    Social History   Socioeconomic History  . Marital status: Married    Spouse name: Not on file  . Number of children: Not on file  . Years of education: Not on file  . Highest education level: Not on file  Occupational History  . Not on file  Social Needs  . Financial resource strain: Not on file  . Food insecurity    Worry: Not on file    Inability: Not on file  . Transportation needs    Medical: Not on file    Non-medical: Not on file  Tobacco Use  . Smoking status: Never Smoker  . Smokeless tobacco: Never Used  Substance and Sexual Activity  . Alcohol use: No  . Drug use: No  . Sexual activity: Not on  file  Lifestyle  . Physical activity    Days per week: Not on file    Minutes per session: Not on file  . Stress: Not on file  Relationships  . Social Herbalist on phone: Not on file    Gets together: Not on file    Attends religious service: Not on file  Active member of club or organization: Not on file    Attends meetings of clubs or organizations: Not on file    Relationship status: Not on file  . Intimate partner violence    Fear of current or ex partner: Not on file    Emotionally abused: Not on file    Physically abused: Not on file    Forced sexual activity: Not on file  Other Topics Concern  . Not on file  Social History Narrative  . Not on file    Family History  Problem Relation Age of Onset  . Hypertension Mother   . Hypertension Father   . Diabetes Father   . Cancer Sister 50       fibrosarcoma (back); currently 35  . Prostate cancer Maternal Uncle   . Colon cancer Paternal Aunt        Dx 41s; deceased 49s  . Prostate cancer Paternal Uncle        currently 75  . Cancer Paternal Uncle 22       "bone" ; unk. primary  . Stomach cancer Paternal Uncle     Past Surgical History:  Procedure Laterality Date  . ABDOMINAL HYSTERECTOMY    . FOOT SURGERY  04/2018   [facility]  . INCISIONAL HERNIA REPAIR N/A 07/06/2015   Procedure: INCISIONAL HERNIA REPAIR ;  Surgeon: Rolm Bookbinder, MD;  Location: [facility];  Service: General;  Laterality: N/A;  . INSERTION OF MESH N/A 07/06/2015   Procedure: WITH INSERTION OF PHASIX ST MESH;  Surgeon: Rolm Bookbinder, MD;  Location: [facility];  Service: General;  Laterality: N/A;  . LAPAROTOMY N/A 07/06/2015   Procedure: EXPLORATORY LAPAROTOMY;  Surgeon: Everitt Amber, MD;  Location: [facility];  Service: Gynecology;  Laterality: N/A;  . LYSIS OF ADHESION N/A 07/06/2015   Procedure:  LYSIS OF ADHESION RESECTION OF MESENTERIC MASS BOWEL RESECTION ;  Surgeon: Everitt Amber, MD;  Location: [facility];  Service: Gynecology;   Laterality: N/A;  . ROBOTIC ASSISTED TOTAL HYSTERECTOMY WITH BILATERAL SALPINGO OOPHERECTOMY Bilateral 07/28/2014   Procedure: ROBOTIC ASSISTED lysis of adhesions with biopsies, converted to LAPAROTOMY, bilateral salpingoorphorectomy, omentectomy,appendectomy;  Surgeon: Janie Morning, MD;  Location: [facility];  Service: Gynecology;  Laterality: Bilateral;  . THYROIDECTOMY, PARTIAL      ROS: Review of Systems Negative except as stated above  PHYSICAL EXAM: BP 123/87   Pulse 74   Temp 98.2 F (36.8 C) (Oral)   Resp 16   Ht 5' 5"  (1.651 m)   Wt 224 lb 9.6 oz (101.9 kg)   SpO2 100%   BMI 37.38 kg/m   Wt Readings from Last 3 Encounters:  03/22/19 224 lb 9.6 oz (101.9 kg)  11/23/18 227 lb 6.4 oz (103.1 kg)  08/28/18 226 lb 3.2 oz (102.6 kg)    Physical Exam  General appearance - alert, well appearing, and in no distress Mental status - normal mood, behavior, speech, dress, motor activity, and thought processes Neck - supple, no significant adenopathy Chest - clear to auscultation, no wheezes, rales or rhonchi, symmetric air entry Heart - normal rate, regular rhythm, normal S1, S2, no murmurs, rubs, clicks or gallops Abdomen -large protruding ventral hernia unchanged from last visit Extremities -no lower extremity edema Diabetic Foot Exam - Simple   Simple Foot Form Visual Inspection See comments: Yes Sensation Testing Intact to touch and monofilament testing bilaterally: Yes Pulse Check Posterior Tibialis and Dorsalis pulse intact bilaterally: Yes Comments Patient is slightly flat-footed.  She has a  healed surgical scar on the dorsal surface of the right big toe.      CMP Latest Ref Rng & Units 12/04/2018 11/23/2018 08/28/2018  Glucose 65 - 99 mg/dL 247(H) 231(H) 223(H)  BUN 6 - 24 mg/dL 15 16 20   Creatinine 0.57 - 1.00 mg/dL 1.05(H) 1.29(H) 1.11(H)  Sodium 134 - 144 mmol/L 137 140 138  Potassium 3.5 - 5.2 mmol/L 4.1 4.1 3.9  Chloride 96 - 106 mmol/L 96 99 95(L)  CO2 20 -  29 mmol/L 21 23 21   Calcium 8.7 - 10.2 mg/dL 10.6(H) 10.4(H) 10.0  Total Protein 6.0 - 8.5 g/dL 7.4 - -  Total Bilirubin 0.0 - 1.2 mg/dL 0.5 - -  Alkaline Phos 39 - 117 IU/L 67 - -  AST 0 - 40 IU/L 33 - -  ALT 0 - 32 IU/L 28 - -   Lipid Panel     Component Value Date/Time   CHOL 166 12/04/2018 0914   TRIG 222 (H) 12/04/2018 0914   HDL 42 12/04/2018 0914   CHOLHDL 4.0 12/04/2018 0914   CHOLHDL 5.1 (H) 08/14/2016 1729   VLDL 74 (H) 08/14/2016 1729   LDLCALC 80 12/04/2018 0914    CBC    Component Value Date/Time   WBC 7.7 12/04/2018 0914   WBC 7.0 04/22/2016 0750   WBC 9.4 07/10/2015 0539   RBC 4.67 12/04/2018 0914   RBC 4.23 04/22/2016 0750   RBC 3.91 07/10/2015 0539   HGB 13.6 12/04/2018 0914   HGB 12.9 04/22/2016 0750   HCT 41.0 12/04/2018 0914   HCT 39.0 04/22/2016 0750   PLT 332 12/04/2018 0914   MCV 88 12/04/2018 0914   MCV 92.2 04/22/2016 0750   MCH 29.1 12/04/2018 0914   MCH 30.6 04/22/2016 0750   MCH 30.4 07/10/2015 0539   MCHC 33.2 12/04/2018 0914   MCHC 33.2 04/22/2016 0750   MCHC 32.5 07/10/2015 0539   RDW 13.6 12/04/2018 0914   RDW 13.9 04/22/2016 0750   LYMPHSABS 2.7 04/22/2016 0750   MONOABS 1.0 (H) 04/22/2016 0750   EOSABS 0.1 04/22/2016 0750   BASOSABS 0.0 04/22/2016 0750    ASSESSMENT AND PLAN: 1. Uncontrolled type 2 diabetes mellitus with peripheral neuropathy ([place]) Discussed healthy eating habits.  Commended her on moving more.  Increase Lantus to 15 units daily.  Continue to monitor blood sugars.  If morning blood sugars begin to drop below 80, she is advised to decrease the Lantus by 2 units - POCT glucose (manual entry) - POCT glycosylated hemoglobin (Hb A1C) - Microalbumin / creatinine urine ratio - Amb ref to Medical Nutrition Therapy-MNT - Insulin Glargine (LANTUS SOLOSTAR) 100 UNIT/ML Solostar Pen; Inject 15 Units into the skin daily.  Dispense: 5 pen; Refill: 6  2. Essential hypertension Close to goal.  No changes made as she has  not taken all of her medicines as yet for today. - amLODipine (NORVASC) 10 MG tablet; Take 1 tablet (10 mg total) by mouth daily.  Dispense: 90 tablet; Refill: 3  3. Obesity (BMI 30-39.9) See #1 above - Amb ref to Medical Nutrition Therapy-MNT  4. Ventral hernia without obstruction or gangrene Keep appointment with general surgeon  5. Hypercalcemia - TSH - PTH-Related Peptide - PTH, Intact and Calcium  6. Chronic midline low back pain without sciatica Better after going through physical therapy  7. Need for influenza vaccination Given    Patient was given the opportunity to ask questions.  Patient verbalized understanding of the plan and was able to  repeat key elements of the plan.   Orders Placed This Encounter  Procedures  . Microalbumin / creatinine urine ratio  . POCT glucose (manual entry)  . POCT glycosylated hemoglobin (Hb A1C)     Requested Prescriptions    No prescriptions requested or ordered in this encounter    No follow-ups on file.  Karle Plumber, MD, FACP

## 2019-03-22 NOTE — Patient Instructions (Addendum)
Increase Lantus to 15 units daily.  Continue to monitor your blood sugars with goals before meals being 90-130.  Try to check your blood pressure at least twice a week.  Goal is 130/80 or lower.  Influenza Virus Vaccine injection (Fluarix) What is this medicine? INFLUENZA VIRUS VACCINE (in floo EN zuh VAHY ruhs vak SEEN) helps to reduce the risk of getting influenza also known as the flu. This medicine may be used for other purposes; ask your health care provider or pharmacist if you have questions. COMMON BRAND NAME(S): Fluarix, Fluzone What should I tell my health care provider before I take this medicine? They need to know if you have any of these conditions:  bleeding disorder like hemophilia  fever or infection  Guillain-Barre syndrome or other neurological problems  immune system problems  infection with the human immunodeficiency virus (HIV) or AIDS  low blood platelet counts  multiple sclerosis  an unusual or allergic reaction to influenza virus vaccine, eggs, chicken proteins, latex, gentamicin, other medicines, foods, dyes or preservatives  pregnant or trying to get pregnant  breast-feeding How should I use this medicine? This vaccine is for injection into a muscle. It is given by a health care professional. A copy of Vaccine Information Statements will be given before each vaccination. Read this sheet carefully each time. The sheet may change frequently. Talk to your pediatrician regarding the use of this medicine in children. Special care may be needed. Overdosage: If you think you have taken too much of this medicine contact a poison control center or emergency room at once. NOTE: This medicine is only for you. Do not share this medicine with others. What if I miss a dose? This does not apply. What may interact with this medicine?  chemotherapy or radiation therapy  medicines that lower your immune system like etanercept, anakinra, infliximab, and  adalimumab  medicines that treat or prevent blood clots like warfarin  phenytoin  steroid medicines like prednisone or cortisone  theophylline  vaccines This list may not describe all possible interactions. Give your health care provider a list of all the medicines, herbs, non-prescription drugs, or dietary supplements you use. Also tell them if you smoke, drink alcohol, or use illegal drugs. Some items may interact with your medicine. What should I watch for while using this medicine? Report any side effects that do not go away within 3 days to your doctor or health care professional. Call your health care provider if any unusual symptoms occur within 6 weeks of receiving this vaccine. You may still catch the flu, but the illness is not usually as bad. You cannot get the flu from the vaccine. The vaccine will not protect against colds or other illnesses that may cause fever. The vaccine is needed every year. What side effects may I notice from receiving this medicine? Side effects that you should report to your doctor or health care professional as soon as possible:  allergic reactions like skin rash, itching or hives, swelling of the face, lips, or tongue Side effects that usually do not require medical attention (report to your doctor or health care professional if they continue or are bothersome):  fever  headache  muscle aches and pains  pain, tenderness, redness, or swelling at site where injected  weak or tired This list may not describe all possible side effects. Call your doctor for medical advice about side effects. You may report side effects to FDA at 1-800-FDA-1088. Where should I keep my medicine? This vaccine  is only given in a clinic, pharmacy, doctor's office, or other health care setting and will not be stored at home. NOTE: This sheet is a summary. It may not cover all possible information. If you have questions about this medicine, talk to your doctor, pharmacist,  or health care provider.  2020 Elsevier/Gold Standard (2007-12-23 09:30:40)

## 2019-03-23 LAB — MICROALBUMIN / CREATININE URINE RATIO
Creatinine, Urine: 22.9 mg/dL
Microalb/Creat Ratio: 150 mg/g creat — ABNORMAL HIGH (ref 0–29)
Microalbumin, Urine: 34.3 ug/mL

## 2019-03-25 ENCOUNTER — Encounter: Payer: Medicare Other | Admitting: Physical Therapy

## 2019-03-29 ENCOUNTER — Telehealth: Payer: Self-pay | Admitting: Internal Medicine

## 2019-03-29 ENCOUNTER — Other Ambulatory Visit: Payer: Self-pay | Admitting: Family Medicine

## 2019-03-29 DIAGNOSIS — I10 Essential (primary) hypertension: Secondary | ICD-10-CM

## 2019-03-29 LAB — PTH-RELATED PEPTIDE: PTH-related peptide: <2 pmol/L

## 2019-03-29 LAB — PTH, INTACT AND CALCIUM
Calcium: 10.9 mg/dL — ABNORMAL HIGH (ref 8.7–10.2)
PTH: 21 pg/mL (ref 15–65)

## 2019-03-29 LAB — TSH: TSH: 0.698 u[IU]/mL (ref 0.450–4.500)

## 2019-03-29 NOTE — Telephone Encounter (Signed)
Patient called stating she received a reminder to get her a1c checked but states she has already checked it and would like to know if she has to check it again. Please follow up.

## 2019-03-30 NOTE — Telephone Encounter (Signed)
No pt doesn't

## 2019-03-31 ENCOUNTER — Other Ambulatory Visit: Payer: Self-pay | Admitting: Internal Medicine

## 2019-04-01 ENCOUNTER — Telehealth: Payer: Self-pay

## 2019-04-01 NOTE — Telephone Encounter (Signed)
Contacted pt to go over lab results pt is aware and doesn't have any questions or concerns 

## 2019-04-06 DIAGNOSIS — H53433 Sector or arcuate defects, bilateral: Secondary | ICD-10-CM | POA: Diagnosis not present

## 2019-04-06 DIAGNOSIS — H16293 Other keratoconjunctivitis, bilateral: Secondary | ICD-10-CM | POA: Diagnosis not present

## 2019-04-06 DIAGNOSIS — H401132 Primary open-angle glaucoma, bilateral, moderate stage: Secondary | ICD-10-CM | POA: Diagnosis not present

## 2019-04-06 DIAGNOSIS — E119 Type 2 diabetes mellitus without complications: Secondary | ICD-10-CM | POA: Diagnosis not present

## 2019-04-06 LAB — HM DIABETES EYE EXAM

## 2019-04-27 ENCOUNTER — Encounter: Payer: Medicare Other | Attending: Internal Medicine | Admitting: Registered"

## 2019-04-27 ENCOUNTER — Other Ambulatory Visit: Payer: Self-pay

## 2019-04-27 ENCOUNTER — Encounter: Payer: Self-pay | Admitting: Registered"

## 2019-04-27 DIAGNOSIS — E119 Type 2 diabetes mellitus without complications: Secondary | ICD-10-CM | POA: Insufficient documentation

## 2019-04-27 NOTE — Progress Notes (Signed)
Diabetes Self-Management Education  Visit Type: First/Initial  Appt. Start Time: 0900 Appt. End Time: 1005  04/27/2019  [name], identified by name and date of birth, is a 53 y.o. female with a diagnosis of Diabetes: Type 2.   ASSESSMENT  There were no vitals taken for this visit. There is no height or weight on file to calculate BMI.   Pt states she was diagnosed with T2DM in 2016, at the time having chemo therapy. Pt reports she was aware she had prediabetes. Pt states she didn't take it seriously until her father passed away in [id number] from complicated r/t DM.   Diet: Patient states she aims to eat 3 meals/day, but will skip breakfast if has errands to do or she will only have a snack for lunch if eating breakfast. Pt states she is an emotional eater, when happy or sad. Pt states she does not have depression.  SMBG: FBG 130-191 mg/dL; before lunch 111-130 mg/dL, before dinner 130-15 mg/dL, HS 170s mg/dL. Patient states they were all in the 200s before she started insulin again. Pt states mostly the morning number is what concerns her.  Sleep: "not good at all" wakes up a couple of times during the night to use the bathroom and has a hard time going back to sleep. Will watch TV (in bedroom) or use a coloring app on her phone. Pt states in the evenings she usually watches TV until she gets sleepy, which is around 8 pm if she has had a busy day or later if she has been laying around. Pt states often will wake up at 4 am and doesn't get sleepy until 6 am but has to take someone to work.  Exercise: Pt states she really enjoys walking started 4 months ago and has a couple of trails she likes. Pt states she walks with husband (separated, but still friends) and he walks fast. Pt also walks with sister and mother other days. Pt states d/t neuropathy she may not walk, but some days she doesn't let the pain/numbness stop her. Pt reports she does not have special shoes for neuropathy, just wears  comfortable loafers or tennis shoes.  Relevant problems: GERD, constipation, diagnosed with glaucoma 2009-2010, lately pressure has been good, doesn't affect vision.  HTN: controlled 5 medications. Those that may be contributing to elevated BG are Metoprolol and HCTZ (dose >25 mg, increased to 37.5 07/2018)  Pt reports leg cramps 1-2 month in evening that affects sleep. Pt also states she occasionally has burning from neuropathy that the gabapentin doesn't seem to be helping much sometimes.  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 Pt states she doesn't want to go into fabric stores with COVID. Pt states she has learned that she really enjoys walking, and finds coloring on her phone app helps her relax.  Pt will return for next appointment to be focused on food. In addition to blood sugar control, RD also wants to discuss strategies for improved blood pressure, getting adequate potassium and magnesium in the diet, and reducing saturated fat.  Diabetes Self-Management Education - 04/27/19 0934      Visit Information   Visit Type  First/Initial      Initial Visit   Diabetes Type  Type 2    Are you currently following a meal plan?  No    Are you taking your medications as prescribed?  Yes   metformin 2g, Lantus 15 u, glimepiride  Date Diagnosed  2016      Health Coping   How would you rate your overall health?  Fair      Psychosocial Assessment   Patient Belief/Attitude about Diabetes  Afraid    How often do you need to have someone help you when you read instructions, pamphlets, or other written materials from your doctor or pharmacy?  1 - Never    What is the last grade level you completed in school?  12      Complications   Last HgB A1C per patient/outside source  9.3 %   per epic   How often do you check your blood sugar?  3-4 times/day    Fasting Blood glucose range (mg/dL)  180-200;130-179   130-191   Have you had a dilated eye exam in the past  12 months?  Yes    Have you had a dental exam in the past 12 months?  No    Are you checking your feet?  No      Dietary Intake   Breakfast  none if errands OR sausage, eggs, cheese sandwich OR hashbrowns and egg sandwich OR shrimp & grits often    Snack (morning)  fruit OR small bag chips    Lunch  tuna sandwich, chips OR crab legs, sausage, corn    Snack (afternoon)  same    Dinner  sloppy joes w/ [place] burger OR chicken or pork baked or grilled,  green vegetable    Beverage(s)  64 oz water, occassional soda (usually just 1/2), drop-in 1-2x/week      Exercise   Exercise Type  Light (walking / raking leaves)   stopped last 2 weeks with colder weather   How many days per week to you exercise?  5    How many minutes per day do you exercise?  45    Total minutes per week of exercise  225      Patient Education   Previous Diabetes Education  No    Physical activity and exercise   Role of exercise on diabetes management, blood pressure control and cardiac health.    Chronic complications  Assessed and discussed foot care and prevention of foot problems    Personal strategies to promote health  Other (comment)   get better sleep     Individualized Goals (developed by patient)   Physical Activity  Exercise 5-7 days per week    Reducing Risk  Other (comment)   get better sleep     Outcomes   Expected Outcomes  Demonstrated interest in learning. Expect positive outcomes    Future DMSE  4-6 wks    Program Status  Not Completed       Individualized Plan for Diabetes Self-Management Training:   Learning Objective:  Patient will have a greater understanding of diabetes self-management. Patient education plan is to attend individual and/or group sessions per assessed needs and concerns.  Patient Instructions  Consider looking into Lyrica since the gabapentin doesn't seem to be helping the burning pain as much as you would like and if it is something you would want to try, asked your  doctor if that is something she would recommend. To help with bringing down your morning blood sugar: Aim to work on getting better sleep: Getting a bedtime routine: Calm is a magnesium supplement that may help with cramping as well as better sleep. Consider coloring instead of watching TV when you get up at night to help you go back  to sleep.  Get back into your exercise routine   Expected Outcomes:  Demonstrated interest in learning. Expect positive outcomes  Education material provided:  Sleep hygiene, foot care  If problems or questions, patient to contact team via:  Phone and MyChart  Future DSME appointment: 4-6 wks

## 2019-04-27 NOTE — Patient Instructions (Addendum)
Consider looking into Lyrica since the gabapentin doesn't seem to be helping the burning pain as much as you would like and if it is something you would want to try, asked your doctor if that is something she would recommend. To help with bringing down your morning blood sugar: Aim to work on getting better sleep: Getting a bedtime routine: Calm is a magnesium supplement that may help with cramping as well as better sleep. Consider coloring instead of watching TV when you get up at night to help you go back to sleep.  Get back into your exercise routine

## 2019-04-29 ENCOUNTER — Ambulatory Visit: Payer: Medicare Other | Attending: Internal Medicine

## 2019-04-29 ENCOUNTER — Other Ambulatory Visit: Payer: Self-pay

## 2019-05-03 LAB — TSH: TSH: 0.977 u[IU]/mL (ref 0.450–4.500)

## 2019-05-03 LAB — PTH, INTACT AND CALCIUM
Calcium: 11.1 mg/dL — ABNORMAL HIGH (ref 8.7–10.2)
PTH: 26 pg/mL (ref 15–65)

## 2019-05-03 LAB — PTH-RELATED PEPTIDE: PTH-related peptide: <2 pmol/L

## 2019-05-04 ENCOUNTER — Telehealth: Payer: Self-pay | Admitting: Internal Medicine

## 2019-05-04 DIAGNOSIS — K432 Incisional hernia without obstruction or gangrene: Secondary | ICD-10-CM | POA: Diagnosis not present

## 2019-05-04 NOTE — Telephone Encounter (Signed)
Phone call placed to patient this morning.  I left a message on her voicemail letting her know that I got the results of recent tests that we did to try to figure out the elevated calcium level.  So far things have come back okay including thyroid level and parathyroid levels.  I would like to do additional tests which will include serum protein electrophoresis UPEP and free light chains to complete the work-up.  Requested that she return to the lab this week or next week.

## 2019-05-14 ENCOUNTER — Ambulatory Visit: Payer: Medicare Other | Attending: Internal Medicine

## 2019-05-14 ENCOUNTER — Other Ambulatory Visit: Payer: Self-pay

## 2019-05-17 LAB — PE AND FLC, SERUM
A/G Ratio: 1.1 (ref 0.7–1.7)
Albumin ELP: 4 g/dL (ref 2.9–4.4)
Alpha 1: 0.2 g/dL (ref 0.0–0.4)
Alpha 2: 0.7 g/dL (ref 0.4–1.0)
Beta: 1.3 g/dL (ref 0.7–1.3)
Gamma Globulin: 1.4 g/dL (ref 0.4–1.8)
Globulin, Total: 3.5 g/dL (ref 2.2–3.9)
Ig Kappa Free Light Chain: 40.2 mg/L — ABNORMAL HIGH (ref 3.3–19.4)
Ig Lambda Free Light Chain: 18.2 mg/L (ref 5.7–26.3)
Kappa/Lambda FluidC Ratio: 2.21 — ABNORMAL HIGH (ref 0.26–1.65)
Total Protein: 7.5 g/dL (ref 6.0–8.5)

## 2019-05-17 LAB — PROTEIN ELECTROPHORESIS, URINE REFLEX
Albumin ELP, Urine: 54.1 %
Alpha-1-Globulin, U: 3.8 %
Alpha-2-Globulin, U: 10.4 %
Beta Globulin, U: 19.1 %
Gamma Globulin, U: 12.6 %
Protein, Ur: 20.1 mg/dL

## 2019-05-17 LAB — VITAMIN D 25 HYDROXY (VIT D DEFICIENCY, FRACTURES): Vit D, 25-Hydroxy: 10.2 ng/mL — ABNORMAL LOW (ref 30.0–100.0)

## 2019-05-23 ENCOUNTER — Telehealth: Payer: Self-pay | Admitting: Internal Medicine

## 2019-05-23 NOTE — Telephone Encounter (Signed)
PC placed to pt today to discuss recent lab results.  I left VMM letting her know I will try to reach her again this week.

## 2019-05-26 ENCOUNTER — Telehealth: Payer: Self-pay | Admitting: Internal Medicine

## 2019-05-26 MED ORDER — VITAMIN D (ERGOCALCIFEROL) 1.25 MG (50000 UNIT) PO CAPS: 50000.0000 [IU] | ORAL_CAPSULE | ORAL | 0 refills | Status: DC

## 2019-05-26 NOTE — Telephone Encounter (Signed)
Phone call placed to patient today to go over lab results.  I got a voicemail message.  I left a message letting her know that I was trying to reach her to go over lab results.  Since I am not able to catch her via phone I will send a letter.

## 2019-05-27 ENCOUNTER — Ambulatory Visit: Payer: Medicare Other | Admitting: Registered"

## 2019-05-31 ENCOUNTER — Telehealth: Payer: Self-pay | Admitting: Internal Medicine

## 2019-05-31 DIAGNOSIS — D8989 Other specified disorders involving the immune mechanism, not elsewhere classified: Secondary | ICD-10-CM

## 2019-05-31 NOTE — Telephone Encounter (Signed)
Referral has been placed. 

## 2019-05-31 NOTE — Telephone Encounter (Signed)
Will forward to covering provider.

## 2019-05-31 NOTE — Telephone Encounter (Signed)
Patient called saying she received a letter form her PCP saying she wanted to refer patient to see a  hematologist. Patient stated that it was fine to be referred out. Please f/u.

## 2019-06-01 ENCOUNTER — Encounter: Payer: Medicare Other | Attending: Internal Medicine | Admitting: Registered"

## 2019-06-01 ENCOUNTER — Other Ambulatory Visit: Payer: Self-pay

## 2019-06-01 ENCOUNTER — Encounter: Payer: Self-pay | Admitting: Registered"

## 2019-06-01 DIAGNOSIS — E119 Type 2 diabetes mellitus without complications: Secondary | ICD-10-CM | POA: Insufficient documentation

## 2019-06-01 NOTE — Patient Instructions (Addendum)
Great job on increasing NEAT activity, and continuous walking for 1 mile in store. At your trip to [place] tomorrow get the mat for your bike. On Dec 24, start in the morning with your goal to use your bike daily for 15 min with a goal of working up to 30 min by your next visit.  Food goal: At least 1 vegetable with dinner every night. At the store tomorrow consider getting Kale, zucchini, mushroom, broccoli as well as eat salad at least once a week. Measure your salad dressing using the serving size on label as a starting point. Let me know if it is less or more than you usually have and your experience of eating salad with less dressing.   Snack: Buy only 2 bags of single serving size chips every 2 weeks.

## 2019-06-01 NOTE — Progress Notes (Signed)
Diabetes Self-Management Education  Visit Type: Follow-up  Appt. Start Time: 0930 Appt. End Time: 1000  06/01/2019  Ms. Phatima Toste, identified by name and date of birth, is a 53 y.o. female with a diagnosis of Diabetes: Type 2.   ASSESSMENT  Height 5\' 5"  (1.651 m), weight 223 lb 8 oz (101.4 kg). Body mass index is 37.19 kg/m.   Body Composition Scale Date 06/01/19  Total Body Fat % 42.6%  Visceral Fat 14  Fat-Free Mass lbs    Total Body Water % 43.1%   Muscle-Mass lbs 31.3 lbs  Body Fat Displacement          Torso  lbs 58.9         Left Leg  lbs 11.7         Right Leg  lbs 11.7         Left Arm  lbs 5.8         Right Arm   lbs 5.8    Pt reports since last visit she has started to increase walking. Pt states she has stopped using handicap parking unless sister is with her. Pt states it is too cold to walk trails, instead walking 1x week 1 mi (calculated with phone) in [place] while her mother shops. Pt states she has resistance bands, but not using; has pedal exercise, but needs to get mat to keep it from sliding around.   Pt states these visits help her and would like to continue monthly and her insurance will be changing to [facility] with $25/co pay  Pt states with the state of COVID the hospital is back to no surgeries. Pt states she has another visit with her surgeon to re-visit potential hernia surgery, but states he wants her to lose ~25 lbs.    Diabetes Self-Management Education - 06/01/19 1957      Visit Information   Visit Type  Follow-up      Initial Visit   Diabetes Type  Type 2      Dietary Intake   Breakfast  shrimp and grits    Lunch  [employer], med fries, med Coke (had to get it because it came with meal)     Dinner  2 salmon patties, white rice, zucchini & squash,     Beverage(s)  water, 2-3/x month drop-ins, a little ginger ale when doesn't feel well      Exercise   Exercise Type  Light (walking / raking leaves)    How many days per week to you  exercise?  1    How many minutes per day do you exercise?  30    Total minutes per week of exercise  30      Patient Education   Nutrition management   Role of diet in the treatment of diabetes and the relationship between the three main macronutrients and blood glucose level    Physical activity and exercise   Helped patient identify appropriate exercises in relation to his/her diabetes, diabetes complications and other health issue.      Individualized Goals (developed by patient)   Nutrition  Other (comment)   increase vegetables, reduce chip intake   Physical Activity  Exercise 5-7 days per week      Patient Self-Evaluation of Goals - Patient rates self as meeting previously set goals (% of time)   Physical Activity  < 25%      Outcomes   Expected Outcomes  Demonstrated interest in learning. Expect positive outcomes  Future DMSE  4-6 wks    Program Status  Not Completed      Subsequent Visit   Since your last visit have you continued or begun to take your medications as prescribed?  Yes   Per pt BP medication lowered d/t high calcium level      Individualized Plan for Diabetes Self-Management Training:   Learning Objective:  Patient will have a greater understanding of diabetes self-management. Patient education plan is to attend individual and/or group sessions per assessed needs and concerns.  Patient Instructions  Doristine Devoid job on increasing NEAT activity, and continuous walking for 1 mile in store. At your trip to [place] tomorrow get the mat for your bike. On Dec 24, start in the morning with your goal to use your bike daily for 15 min with a goal of working up to 30 min by your next visit.  Food goal: At least 1 vegetable with dinner every night. At the store tomorrow consider getting Kale, zucchini, mushroom, broccoli as well as eat salad at least once a week. Measure your salad dressing using the serving size on label as a starting point. Let me know if it is less or  more than you usually have and your experience of eating salad with less dressing.   Snack: Buy only 2 bags of single serving size chips every 2 weeks.   Expected Outcomes:  Demonstrated interest in learning. Expect positive outcomes  Education material provided: none  If problems or questions, patient to contact team via:  Phone and MyChart  Future DSME appointment: 4-6 wks

## 2019-06-03 ENCOUNTER — Telehealth: Payer: Self-pay | Admitting: Nurse Practitioner

## 2019-06-03 NOTE — Telephone Encounter (Signed)
Scheduled per new patient referral, called and notified patient.

## 2019-06-06 ENCOUNTER — Other Ambulatory Visit: Payer: Self-pay | Admitting: Internal Medicine

## 2019-06-06 DIAGNOSIS — IMO0002 Reserved for concepts with insufficient information to code with codable children: Secondary | ICD-10-CM

## 2019-06-06 DIAGNOSIS — E1142 Type 2 diabetes mellitus with diabetic polyneuropathy: Secondary | ICD-10-CM

## 2019-06-08 ENCOUNTER — Telehealth: Payer: Self-pay | Admitting: Internal Medicine

## 2019-06-08 NOTE — Telephone Encounter (Signed)
1) Medication(s) Requested (by name): ° °2) Pharmacy of Choice: ° °3) Special Requests: ° ° °Approved medications will be sent to the pharmacy, we will reach out if there is an issue. ° °Requests made after 3pm may not be addressed until the following business day! ° °If a patient is unsure of the name of the medication(s) please note and ask patient to call back when they are able to provide all info, do not send to responsible party until all information is available! ° °

## 2019-06-09 ENCOUNTER — Telehealth: Payer: Self-pay | Admitting: Nurse Practitioner

## 2019-06-09 ENCOUNTER — Inpatient Hospital Stay: Payer: Medicare Other | Admitting: Nurse Practitioner

## 2019-06-09 NOTE — Telephone Encounter (Signed)
Pt has been cld and rescheduled to see Lacie on 1/6 at 1:45pm

## 2019-06-09 NOTE — Progress Notes (Deleted)
[facility]  [phone] [phone] [place] consult Note   Patient Care Team: Ladell Pier, MD as PCP - General (Internal Medicine) 06/09/2019  CHIEF COMPLAINTS/PURPOSE OF CONSULTATION:  Elevated kappa light chains, referred by Charlott Rakes, MD  HISTORY OF PRESENTING ILLNESS:  Sarah [name] 53 y.o. 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 Evlyn Clines now Dr. Everitt Amber presents for elevated light chains. She was seen by PCP on 03/22/19, routine labs from 12/04/18 showed Ca 10.6. Work up revealed normal TSH 0.977, normal intact PTH 26, and normal PTH-related peptide <2.0 indicating non-parathyroid hypercalcemia. Additional labs showed vitamin D deficiency of 10.2. On 05/14/19 she underwent serum protein electrophoresis that showed elevated Ig kappa free light chains to 40.2 and elevated kappa/lambda ratio to 2.21. M protein was not detected.   ***She was found to have abnormal CBC from *** ***She denies recent chest pain on exertion, shortness of breath on minimal exertion, pre-syncopal episodes, or palpitations. ***She had not noticed any recent bleeding such as epistaxis, hematuria or hematochezia ***The patient denies over the counter NSAID ingestion. She is not *** on antiplatelets agents. Her last colonoscopy was *** ***She had no prior history or diagnosis of cancer. Her age appropriate screening programs are up-to-date. ***She denies any pica and eats a variety of diet. ***She never donated blood or received blood transfusion ***The patient was prescribed oral iron supplements and she takes ***  MEDICAL HISTORY:  Past Medical History:  Diagnosis Date  . Arthritis   . Family history of colon cancer   . Glaucoma 02/16  . Glucagonoma 07/28/14   Pt denies this but states she has glaucoma  . History of chemotherapy   . Hypertension   . Imbalance   . Neuropathy    feet bilat  .  Nocturia   . Peritoneal carcinomatosis ([place])    carcinoma of ovary   . Shortness of breath dyspnea    hx of 2013 - no problems currently   . Thyroid nodule    history of   . Wears glasses     SURGICAL HISTORY: Past Surgical History:  Procedure Laterality Date  . ABDOMINAL HYSTERECTOMY    . FOOT SURGERY  04/2018   [facility]  . INCISIONAL HERNIA REPAIR N/A 07/06/2015   Procedure: INCISIONAL HERNIA REPAIR ;  Surgeon: Rolm Bookbinder, MD;  Location: [facility];  Service: General;  Laterality: N/A;  . INSERTION OF MESH N/A 07/06/2015   Procedure: WITH INSERTION OF PHASIX ST MESH;  Surgeon: Rolm Bookbinder, MD;  Location: [facility];  Service: General;  Laterality: N/A;  . LAPAROTOMY N/A 07/06/2015   Procedure: EXPLORATORY LAPAROTOMY;  Surgeon: Everitt Amber, MD;  Location: [facility];  Service: Gynecology;  Laterality: N/A;  . LYSIS OF ADHESION N/A 07/06/2015   Procedure:  LYSIS OF ADHESION RESECTION OF MESENTERIC MASS BOWEL RESECTION ;  Surgeon: Everitt Amber, MD;  Location: [facility];  Service: Gynecology;  Laterality: N/A;  . ROBOTIC ASSISTED TOTAL HYSTERECTOMY WITH BILATERAL SALPINGO OOPHERECTOMY Bilateral 07/28/2014   Procedure: ROBOTIC ASSISTED lysis of adhesions with biopsies, converted to LAPAROTOMY, bilateral salpingoorphorectomy, omentectomy,appendectomy;  Surgeon: Janie Morning, MD;  Location: [facility];  Service: Gynecology;  Laterality: Bilateral;  . THYROIDECTOMY, PARTIAL      SOCIAL HISTORY: Social History   Socioeconomic History  . Marital status: Married    Spouse name: Not on file  . Number of children: Not on file  . Years of education:  Not on file  . Highest education level: Not on file  Occupational History  . Not on file  Tobacco Use  . Smoking status: Never Smoker  . Smokeless tobacco: Never Used  Substance and Sexual Activity  . Alcohol use: No  . Drug use: No  . Sexual activity: Not on file  Other Topics Concern  . Not on file  Social History Narrative  . Not on file    Social Determinants of Health   Financial Resource Strain:   . Difficulty of Paying Living Expenses: Not on file  Food Insecurity:   . Worried About Charity fundraiser in the Last Year: Not on file  . Ran Out of Food in the Last Year: Not on file  Transportation Needs:   . Lack of Transportation (Medical): Not on file  . Lack of Transportation (Non-Medical): Not on file  Physical Activity:   . Days of Exercise per Week: Not on file  . Minutes of Exercise per Session: Not on file  Stress:   . Feeling of Stress : Not on file  Social Connections:   . Frequency of Communication with Friends and Family: Not on file  . Frequency of Social Gatherings with Friends and Family: Not on file  . Attends Religious Services: Not on file  . Active Member of Clubs or Organizations: Not on file  . Attends Archivist Meetings: Not on file  . Marital Status: Not on file  Intimate Partner Violence:   . Fear of Current or Ex-Partner: Not on file  . Emotionally Abused: Not on file  . Physically Abused: Not on file  . Sexually Abused: Not on file    FAMILY HISTORY: Family History  Problem Relation Age of Onset  . Hypertension Mother   . Hypertension Father   . Diabetes Father   . Cancer Sister 50       fibrosarcoma (back); currently 84  . Prostate cancer Maternal Uncle   . Colon cancer Paternal Aunt        Dx 74s; deceased 58s  . Prostate cancer Paternal Uncle        currently 15  . Cancer Paternal Uncle 71       "bone" ; unk. primary  . Stomach cancer Paternal Uncle     ALLERGIES:  is allergic to [facility] [aprepitant] and lisinopril.  MEDICATIONS:  Current Outpatient Medications  Medication Sig Dispense Refill  . ACCU-CHEK SMARTVIEW test strip TID and a bedtime. E11.9 100 each 12  . amLODipine (NORVASC) 10 MG tablet Take 1 tablet (10 mg total) by mouth daily. 90 tablet 3  . blood glucose meter kit and supplies Dispense based on patient and insurance preference. Use up to  four times daily as directed. (FOR ICD-9 250.00, 250.01). 1 each 0  . diclofenac sodium (VOLTAREN) 1 % GEL Apply 2 g topically 4 (four) times daily. 100 g 3  . gabapentin (NEURONTIN) 300 MG capsule T6AKE 1 CAPSULE BY MOUTH EVERY MORNING AND 1 CAPSULE AT NOON, AND 2 CAPSULES AT BEDTIME 120 capsule 5  . glimepiride (AMARYL) 4 MG tablet Take 1 tablet (4 mg total) by mouth daily with breakfast. 90 tablet 0  . hydrALAZINE (APRESOLINE) 25 MG tablet Take 1 tablet (25 mg total) by mouth 3 (three) times daily. 360 tablet 1  . hydrochlorothiazide (HYDRODIURIL) 25 MG tablet Take 1.5 tablets (37.5 mg total) by mouth daily. 135 tablet 3  . Insulin Glargine (LANTUS SOLOSTAR) 100 UNIT/ML Solostar Pen Inject 15  Units into the skin daily. 5 pen 6  . Insulin Pen Needle 29G X 12MM MISC Use as directed. 100 each 6  . Lancets (ACCU-CHEK MULTICLIX) lancets   0  . latanoprost (XALATAN) 0.005 % ophthalmic solution Place 1 drop into both eyes at bedtime.    Marland Kitchen losartan (COZAAR) 50 MG tablet Take 1 tablet (50 mg total) by mouth daily. 90 tablet 0  . metFORMIN (GLUCOPHAGE) 1000 MG tablet TAKE 1 TABLET BY MOUTH TWICE DAILY WITH  A  MEAL 180 tablet 0  . metoprolol tartrate (LOPRESSOR) 25 MG tablet TAKE 1/2 TABLET BY MOUTH 2 TIMES A DAY 90 tablet 3  . rosuvastatin (CRESTOR) 20 MG tablet Take 1 tablet (20 mg total) by mouth daily. Stop Atorvastatin 90 tablet 2  . timolol (TIMOPTIC) 0.5 % ophthalmic solution Place 1 drop into both eyes daily.    . Vitamin D, Ergocalciferol, (DRISDOL) 1.25 MG (50000 UT) CAPS capsule Take 1 capsule (50,000 Units total) by mouth every 7 (seven) days. 12 capsule 0   No current facility-administered medications for this visit.    REVIEW OF SYSTEMS:   Constitutional: Denies fevers, chills or abnormal night sweats Eyes: Denies blurriness of vision, double vision or watery eyes Ears, nose, mouth, throat, and face: Denies mucositis or sore throat Respiratory: Denies cough, dyspnea or  wheezes Cardiovascular: Denies palpitation, chest discomfort or lower extremity swelling Gastrointestinal:  Denies nausea, heartburn or change in bowel habits Skin: Denies abnormal skin rashes Lymphatics: Denies new lymphadenopathy or easy bruising Neurological:Denies numbness, tingling or new weaknesses Behavioral/Psych: Mood is stable, no new changes  All other systems were reviewed with the patient and are negative.  PHYSICAL EXAMINATION: ECOG PERFORMANCE STATUS: {CHL ONC ECOG PS:[phone]}  There were no vitals filed for this visit. There were no vitals filed for this visit.  GENERAL:alert, no distress and comfortable SKIN: skin color, texture, turgor are normal, no rashes or significant lesions EYES: normal, conjunctiva are pink and non-injected, sclera clear OROPHARYNX:no exudate, no erythema and lips, buccal mucosa, and tongue normal  NECK: supple, thyroid normal size, non-tender, without nodularity LYMPH:  no palpable lymphadenopathy in the cervical, axillary or inguinal LUNGS: clear to auscultation and percussion with normal breathing effort HEART: regular rate & rhythm and no murmurs and no lower extremity edema ABDOMEN:abdomen soft, non-tender and normal bowel sounds Musculoskeletal:no cyanosis of digits and no clubbing  PSYCH: alert & oriented x 3 with fluent speech NEURO: no focal motor/sensory deficits  LABORATORY DATA:  I have reviewed the data as listed CBC Latest Ref Rng & Units 12/04/2018 11/17/2017 04/22/2016  WBC 3.4 - 10.8 x10E3/uL 7.7 7.0 7.0  Hemoglobin 11.1 - 15.9 g/dL 13.6 13.0 12.9  Hematocrit 34.0 - 46.6 % 41.0 38.0 39.0  Platelets 150 - 450 x10E3/uL 332 309 302    CMP Latest Ref Rng & Units 05/14/2019 04/29/2019 03/22/2019  Glucose 65 - 99 mg/dL - - -  BUN 6 - 24 mg/dL - - -  Creatinine 0.57 - 1.00 mg/dL - - -  Sodium 134 - 144 mmol/L - - -  Potassium 3.5 - 5.2 mmol/L - - -  Chloride 96 - 106 mmol/L - - -  CO2 20 - 29 mmol/L - - -  Calcium 8.7 -  10.2 mg/dL - 11.1(H) 10.9(H)  Total Protein 6.0 - 8.5 g/dL 7.5 - -  Total Bilirubin 0.0 - 1.2 mg/dL - - -  Alkaline Phos 39 - 117 IU/L - - -  AST 0 - 40 IU/L - - -  ALT 0 - 32 IU/L - - -     RADIOGRAPHIC STUDIES: I have personally reviewed the radiological images as listed and agreed with the findings in the report. No results found.  ASSESSMENT & PLAN:  No problem-specific Assessment & Plan notes found for this encounter.    All questions were answered. The patient knows to call the clinic with any problems, questions or concerns. I spent {CHL ONC TIME VISIT - PETKK:[id number]} counseling the patient face to face. The total time spent in the appointment was {CHL ONC TIME VISIT - JDYNX:[id number]} and more than 50% was on counseling.     Alla Feeling, NP 06/09/19 9:49 AM

## 2019-06-14 DIAGNOSIS — H401133 Primary open-angle glaucoma, bilateral, severe stage: Secondary | ICD-10-CM | POA: Diagnosis not present

## 2019-06-15 ENCOUNTER — Other Ambulatory Visit: Payer: Self-pay | Admitting: Internal Medicine

## 2019-06-15 DIAGNOSIS — I10 Essential (primary) hypertension: Secondary | ICD-10-CM

## 2019-06-15 DIAGNOSIS — IMO0002 Reserved for concepts with insufficient information to code with codable children: Secondary | ICD-10-CM

## 2019-06-15 DIAGNOSIS — E785 Hyperlipidemia, unspecified: Secondary | ICD-10-CM

## 2019-06-15 DIAGNOSIS — E1142 Type 2 diabetes mellitus with diabetic polyneuropathy: Secondary | ICD-10-CM

## 2019-06-15 NOTE — Telephone Encounter (Signed)
Returned pt call and double checked about medications being sent to [facility]. Pt is requesting that all rx's be sent to [facility].   Dr. Wynetta Emery pt is also needing a refill for voltaren gel. Pt also states her Hydrochlorothiazide was decreased back to 25MG  once a day instead of 37.5MG  1.5 tablet a day.

## 2019-06-15 NOTE — Telephone Encounter (Signed)
Pt called to request an update on some paperwork from [facility], this paperwork is to have all her medications be changed to mail order. Please follow up

## 2019-06-16 ENCOUNTER — Encounter: Payer: Self-pay | Admitting: Nurse Practitioner

## 2019-06-16 ENCOUNTER — Inpatient Hospital Stay: Payer: Medicare HMO

## 2019-06-16 ENCOUNTER — Inpatient Hospital Stay: Payer: Medicare HMO | Attending: Nurse Practitioner | Admitting: Nurse Practitioner

## 2019-06-16 ENCOUNTER — Telehealth: Payer: Self-pay | Admitting: Nurse Practitioner

## 2019-06-16 ENCOUNTER — Other Ambulatory Visit: Payer: Self-pay

## 2019-06-16 VITALS — BP 139/67 | HR 78 | Temp 98.2°F | Resp 18 | Ht 65.0 in | Wt 226.6 lb

## 2019-06-16 DIAGNOSIS — Z8589 Personal history of malignant neoplasm of other organs and systems: Secondary | ICD-10-CM

## 2019-06-16 DIAGNOSIS — I10 Essential (primary) hypertension: Secondary | ICD-10-CM | POA: Diagnosis not present

## 2019-06-16 DIAGNOSIS — Z794 Long term (current) use of insulin: Secondary | ICD-10-CM

## 2019-06-16 DIAGNOSIS — R232 Flushing: Secondary | ICD-10-CM

## 2019-06-16 DIAGNOSIS — G8929 Other chronic pain: Secondary | ICD-10-CM | POA: Diagnosis not present

## 2019-06-16 DIAGNOSIS — Z8249 Family history of ischemic heart disease and other diseases of the circulatory system: Secondary | ICD-10-CM

## 2019-06-16 DIAGNOSIS — Z888 Allergy status to other drugs, medicaments and biological substances status: Secondary | ICD-10-CM | POA: Diagnosis not present

## 2019-06-16 DIAGNOSIS — M549 Dorsalgia, unspecified: Secondary | ICD-10-CM

## 2019-06-16 DIAGNOSIS — E1140 Type 2 diabetes mellitus with diabetic neuropathy, unspecified: Secondary | ICD-10-CM

## 2019-06-16 DIAGNOSIS — Z833 Family history of diabetes mellitus: Secondary | ICD-10-CM | POA: Diagnosis not present

## 2019-06-16 DIAGNOSIS — Z90722 Acquired absence of ovaries, bilateral: Secondary | ICD-10-CM

## 2019-06-16 DIAGNOSIS — Z809 Family history of malignant neoplasm, unspecified: Secondary | ICD-10-CM | POA: Diagnosis not present

## 2019-06-16 DIAGNOSIS — D8989 Other specified disorders involving the immune mechanism, not elsewhere classified: Secondary | ICD-10-CM

## 2019-06-16 DIAGNOSIS — Z9221 Personal history of antineoplastic chemotherapy: Secondary | ICD-10-CM

## 2019-06-16 DIAGNOSIS — M1990 Unspecified osteoarthritis, unspecified site: Secondary | ICD-10-CM | POA: Diagnosis not present

## 2019-06-16 DIAGNOSIS — Z79899 Other long term (current) drug therapy: Secondary | ICD-10-CM

## 2019-06-16 DIAGNOSIS — Z8042 Family history of malignant neoplasm of prostate: Secondary | ICD-10-CM

## 2019-06-16 DIAGNOSIS — E559 Vitamin D deficiency, unspecified: Secondary | ICD-10-CM | POA: Diagnosis not present

## 2019-06-16 DIAGNOSIS — Z8543 Personal history of malignant neoplasm of ovary: Secondary | ICD-10-CM | POA: Diagnosis not present

## 2019-06-16 DIAGNOSIS — R252 Cramp and spasm: Secondary | ICD-10-CM | POA: Diagnosis not present

## 2019-06-16 DIAGNOSIS — Z800 Family history of malignant neoplasm of digestive organs: Secondary | ICD-10-CM | POA: Diagnosis not present

## 2019-06-16 DIAGNOSIS — R531 Weakness: Secondary | ICD-10-CM | POA: Diagnosis not present

## 2019-06-16 LAB — CMP (CANCER CENTER ONLY)
ALT: 29 U/L (ref 0–44)
AST: 28 U/L (ref 15–41)
Albumin: 4 g/dL (ref 3.5–5.0)
Alkaline Phosphatase: 59 U/L (ref 38–126)
Anion gap: 12 (ref 5–15)
BUN: 21 mg/dL — ABNORMAL HIGH (ref 6–20)
CO2: 29 mmol/L (ref 22–32)
Calcium: 10.5 mg/dL — ABNORMAL HIGH (ref 8.9–10.3)
Chloride: 99 mmol/L (ref 98–111)
Creatinine: 1.53 mg/dL — ABNORMAL HIGH (ref 0.44–1.00)
GFR, Est AFR Am: 45 mL/min — ABNORMAL LOW (ref >60)
GFR, Estimated: 38 mL/min — ABNORMAL LOW (ref >60)
Glucose, Bld: 312 mg/dL — ABNORMAL HIGH (ref 70–99)
Potassium: 4 mmol/L (ref 3.5–5.1)
Sodium: 140 mmol/L (ref 135–145)
Total Bilirubin: 0.7 mg/dL (ref 0.3–1.2)
Total Protein: 7.8 g/dL (ref 6.5–8.1)

## 2019-06-16 LAB — CBC WITH DIFFERENTIAL (CANCER CENTER ONLY)
Abs Immature Granulocytes: 0.03 10*3/uL (ref 0.00–0.07)
Basophils Absolute: 0 10*3/uL (ref 0.0–0.1)
Basophils Relative: 0 %
Eosinophils Absolute: 0.1 10*3/uL (ref 0.0–0.5)
Eosinophils Relative: 2 %
HCT: 41 % (ref 36.0–46.0)
Hemoglobin: 13.5 g/dL (ref 12.0–15.0)
Immature Granulocytes: 0 %
Lymphocytes Relative: 41 %
Lymphs Abs: 2.9 10*3/uL (ref 0.7–4.0)
MCH: 30.1 pg (ref 26.0–34.0)
MCHC: 32.9 g/dL (ref 30.0–36.0)
MCV: 91.3 fL (ref 80.0–100.0)
Monocytes Absolute: 0.7 10*3/uL (ref 0.1–1.0)
Monocytes Relative: 10 %
Neutro Abs: 3.3 10*3/uL (ref 1.7–7.7)
Neutrophils Relative %: 47 %
Platelet Count: 319 10*3/uL (ref 150–400)
RBC: 4.49 MIL/uL (ref 3.87–5.11)
RDW: 12.9 % (ref 11.5–15.5)
WBC Count: 7 10*3/uL (ref 4.0–10.5)
nRBC: 0 % (ref 0.0–0.2)

## 2019-06-16 MED ORDER — LANTUS SOLOSTAR 100 UNIT/ML ~~LOC~~ SOPN: 15.0000 [IU] | PEN_INJECTOR | Freq: Every day | SUBCUTANEOUS | 6 refills | Status: DC

## 2019-06-16 MED ORDER — ACCU-CHEK SOFTCLIX LANCET DEV KIT: PACK | 0 refills | Status: DC

## 2019-06-16 MED ORDER — ACCU-CHEK AVIVA PLUS VI STRP: ORAL_STRIP | 12 refills | Status: DC

## 2019-06-16 MED ORDER — ACCU-CHEK SOFT TOUCH LANCETS MISC: 12 refills | Status: DC

## 2019-06-16 MED ORDER — ACCU-CHEK AVIVA DEVI: 0 refills | Status: AC

## 2019-06-16 MED ORDER — GLIMEPIRIDE 4 MG PO TABS: 4.0000 mg | ORAL_TABLET | Freq: Every day | ORAL | 3 refills | Status: DC

## 2019-06-16 MED ORDER — ROSUVASTATIN CALCIUM 20 MG PO TABS: 20.0000 mg | ORAL_TABLET | Freq: Every day | ORAL | 3 refills | Status: DC

## 2019-06-16 MED ORDER — LOSARTAN POTASSIUM 50 MG PO TABS: 50.0000 mg | ORAL_TABLET | Freq: Every day | ORAL | 3 refills | Status: DC

## 2019-06-16 MED ORDER — GABAPENTIN 300 MG PO CAPS: ORAL_CAPSULE | ORAL | 3 refills | Status: DC

## 2019-06-16 MED ORDER — INSULIN PEN NEEDLE 29G X 12MM MISC: 6 refills | Status: DC

## 2019-06-16 MED ORDER — HYDRALAZINE HCL 25 MG PO TABS: 25.0000 mg | ORAL_TABLET | Freq: Three times a day (TID) | ORAL | 3 refills | Status: DC

## 2019-06-16 MED ORDER — METOPROLOL TARTRATE 25 MG PO TABS: ORAL_TABLET | ORAL | 3 refills | Status: DC

## 2019-06-16 MED ORDER — HYDROCHLOROTHIAZIDE 25 MG PO TABS: 25.0000 mg | ORAL_TABLET | Freq: Every day | ORAL | 3 refills | Status: DC

## 2019-06-16 MED ORDER — VITAMIN D (ERGOCALCIFEROL) 1.25 MG (50000 UNIT) PO CAPS: 50000.0000 [IU] | ORAL_CAPSULE | ORAL | 0 refills | Status: DC

## 2019-06-16 MED ORDER — AMLODIPINE BESYLATE 10 MG PO TABS: 10.0000 mg | ORAL_TABLET | Freq: Every day | ORAL | 3 refills | Status: DC

## 2019-06-16 MED ORDER — METFORMIN HCL 1000 MG PO TABS: ORAL_TABLET | ORAL | 3 refills | Status: DC

## 2019-06-16 NOTE — Telephone Encounter (Signed)
Refills on meds sent to [facility].

## 2019-06-16 NOTE — Progress Notes (Addendum)
[place]  [phone] [phone] [place] consult Note   Patient Care Team: Ladell Pier, MD as PCP - General (Internal Medicine) 06/16/2019  CHIEF COMPLAINTS/PURPOSE OF CONSULTATION:  Light chain abnormality, referred by PCP Dr. Charlott Rakes   HISTORY OF PRESENTING ILLNESS:  Sarah [name] 54 y.o. 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 Evlyn Clines now by Dr. Everitt Amber presents for evaluation of elevated serum kappa light chains. Routine labs per PCP on 12/04/18 showed calcium 10.6. Denies calcium supplement or high-Ca diet. Creatinine at the time was 1.05, BG 247; CMP and CBC otherwise normal. In 03/2019 she underwent further work up revealed normal TSH 0.977, normal intact PTH 26, and normal PTH-related peptide <2.0 indicating non-parathyroid hypercalcemia and repeated in 04/2019 with similar results. Additional labs on 05/14/19 showed vitamin D deficiency of 10.2 for which she began supplementation. On 05/14/19 SPEP showed elevated Ig kappa free light chains to 40.2 and elevated kappa/lamda ratio to 2.21. M protein was not detected.   PMH is significant for HTN, HL, DM, CIPN, and hernia. Negative h/o autoimmune disorder. Socially, she is married but separated. Has 2 adult sons who are healthy, ages 48 and 40. She is currently disabled since chemotherapy, but worked in lab and medical offices previously. She is independent of ADLs and drives herself. Denies smoking history, alcohol, or drug use. She is reportedly UTD on age-appropriate cancer screenings. She is being followed for a likely benign non-biopsied right breast mass. She has family history of fibrosarcoma in sister, bone cancer in paternal uncle, colon cancer in paternal aunt, prostate cancer in a maternal uncle and  paternal uncle, and stomach cancer in paternal uncle. Patient reports having genetic testing done but does not recall the result.   Today, she presents to clinic alone. She feels well in general. Has intermittent fatigue since completing chemotherapy in 2016. Appetite and weight are normal for her. Denies unintentional weight loss. She gets leg cramps often. Reports chronic years-history of back pain that worsened over the summer months, did not improve with PT. Pain is worse with prolonged standing, she attributed to weak core muscles due to her hernia. She has a large abdominal hernia, anticipating repair per Dr. Donne Hazel in 2021. Hot flashes developed after chemo are stable. CIPN in her feet, stable. Denies n/v/c/d, GI or GYN bleeding, fever, chills, cough, chest pain, dyspnea, or leg swelling.   MEDICAL HISTORY:  Past Medical History:  Diagnosis Date  . Arthritis   . Family history of colon cancer   . Glaucoma 02/16  . Glucagonoma 07/28/14   Pt denies this but states she has glaucoma  . History of chemotherapy   . Hypertension   . Imbalance   . Neuropathy    feet bilat  . Nocturia   . Peritoneal carcinomatosis ([place])    carcinoma of ovary   . Shortness of breath dyspnea    hx of 2013 - no problems currently   . Thyroid nodule    history of   . Wears glasses     SURGICAL HISTORY: Past Surgical History:  Procedure Laterality Date  . ABDOMINAL HYSTERECTOMY    . FOOT SURGERY  04/2018   [facility]  . INCISIONAL HERNIA REPAIR N/A 07/06/2015   Procedure: INCISIONAL HERNIA REPAIR ;  Surgeon: Rolm Bookbinder, MD;  Location:  [facility];  Service: General;  Laterality: N/A;  . INSERTION OF MESH N/A 07/06/2015   Procedure: WITH INSERTION OF PHASIX ST MESH;  Surgeon: Rolm Bookbinder, MD;  Location: [facility];  Service: General;  Laterality: N/A;  . LAPAROTOMY N/A 07/06/2015   Procedure: EXPLORATORY LAPAROTOMY;  Surgeon: Everitt Amber, MD;  Location: [facility];  Service: Gynecology;  Laterality:  N/A;  . LYSIS OF ADHESION N/A 07/06/2015   Procedure:  LYSIS OF ADHESION RESECTION OF MESENTERIC MASS BOWEL RESECTION ;  Surgeon: Everitt Amber, MD;  Location: [facility];  Service: Gynecology;  Laterality: N/A;  . ROBOTIC ASSISTED TOTAL HYSTERECTOMY WITH BILATERAL SALPINGO OOPHERECTOMY Bilateral 07/28/2014   Procedure: ROBOTIC ASSISTED lysis of adhesions with biopsies, converted to LAPAROTOMY, bilateral salpingoorphorectomy, omentectomy,appendectomy;  Surgeon: Janie Morning, MD;  Location: [facility];  Service: Gynecology;  Laterality: Bilateral;  . THYROIDECTOMY, PARTIAL      SOCIAL HISTORY: Social History   Socioeconomic History  . Marital status: Married    Spouse name: Not on file  . Number of children: Not on file  . Years of education: Not on file  . Highest education level: Not on file  Occupational History  . Not on file  Tobacco Use  . Smoking status: Never Smoker  . Smokeless tobacco: Never Used  Substance and Sexual Activity  . Alcohol use: No  . Drug use: No  . Sexual activity: Not on file  Other Topics Concern  . Not on file  Social History Narrative  . Not on file   Social Determinants of Health   Financial Resource Strain:   . Difficulty of Paying Living Expenses: Not on file  Food Insecurity:   . Worried About Charity fundraiser in the Last Year: Not on file  . Ran Out of Food in the Last Year: Not on file  Transportation Needs:   . Lack of Transportation (Medical): Not on file  . Lack of Transportation (Non-Medical): Not on file  Physical Activity:   . Days of Exercise per Week: Not on file  . Minutes of Exercise per Session: Not on file  Stress:   . Feeling of Stress : Not on file  Social Connections:   . Frequency of Communication with Friends and Family: Not on file  . Frequency of Social Gatherings with Friends and Family: Not on file  . Attends Religious Services: Not on file  . Active Member of Clubs or Organizations: Not on file  . Attends Theatre manager Meetings: Not on file  . Marital Status: Not on file  Intimate Partner Violence:   . Fear of Current or Ex-Partner: Not on file  . Emotionally Abused: Not on file  . Physically Abused: Not on file  . Sexually Abused: Not on file    FAMILY HISTORY: Family History  Problem Relation Age of Onset  . Hypertension Mother   . Hypertension Father   . Diabetes Father   . Cancer Sister 50       fibrosarcoma (back); currently 45  . Prostate cancer Maternal Uncle   . Colon cancer Paternal Aunt        Dx 78s; deceased 88s  . Prostate cancer Paternal Uncle        currently 33  . Cancer Paternal Uncle 48       "bone" ; unk. primary  . Stomach cancer Paternal Uncle     ALLERGIES:  is allergic to [facility] [aprepitant] and lisinopril.  MEDICATIONS:  Current Outpatient Medications  Medication  Sig Dispense Refill  . amLODipine (NORVASC) 10 MG tablet Take 1 tablet (10 mg total) by mouth daily. 90 tablet 3  . blood glucose meter kit and supplies Dispense based on patient and insurance preference. Use up to four times daily as directed. (FOR ICD-9 250.00, 250.01). 1 each 0  . Blood Glucose Monitoring Suppl (ACCU-CHEK AVIVA) device Use as instructed 1 each 0  . diclofenac sodium (VOLTAREN) 1 % GEL Apply 2 g topically 4 (four) times daily. 100 g 3  . gabapentin (NEURONTIN) 300 MG capsule T6AKE 1 CAPSULE BY MOUTH EVERY MORNING AND 1 CAPSULE AT NOON, AND 2 CAPSULES AT BEDTIME 360 capsule 3  . glimepiride (AMARYL) 4 MG tablet Take 1 tablet (4 mg total) by mouth daily with breakfast. 90 tablet 3  . glucose blood (ACCU-CHEK AVIVA PLUS) test strip Use as instructed 100 each 12  . hydrALAZINE (APRESOLINE) 25 MG tablet Take 1 tablet (25 mg total) by mouth 3 (three) times daily. 270 tablet 3  . hydrochlorothiazide (HYDRODIURIL) 25 MG tablet Take 1 tablet (25 mg total) by mouth daily. 90 tablet 3  . Insulin Glargine (LANTUS SOLOSTAR) 100 UNIT/ML Solostar Pen Inject 15 Units into the skin daily. 5 pen  6  . Insulin Pen Needle 29G X 12MM MISC Use as directed. 100 each 6  . Lancets (ACCU-CHEK MULTICLIX) lancets   0  . Lancets (ACCU-CHEK SOFT TOUCH) lancets Use as instructed 100 each 12  . Lancets Misc. (ACCU-CHEK SOFTCLIX LANCET DEV) KIT Use as directed 1 kit 0  . latanoprost (XALATAN) 0.005 % ophthalmic solution Place 1 drop into both eyes at bedtime.    Marland Kitchen losartan (COZAAR) 50 MG tablet Take 1 tablet (50 mg total) by mouth daily. 90 tablet 3  . metFORMIN (GLUCOPHAGE) 1000 MG tablet TAKE 1 TABLET BY MOUTH TWICE DAILY WITH  A  MEAL 180 tablet 3  . metoprolol tartrate (LOPRESSOR) 25 MG tablet TAKE 1/2 TABLET BY MOUTH 2 TIMES A DAY 90 tablet 3  . rosuvastatin (CRESTOR) 20 MG tablet Take 1 tablet (20 mg total) by mouth daily. Stop Atorvastatin 90 tablet 3  . timolol (TIMOPTIC) 0.5 % ophthalmic solution Place 1 drop into both eyes daily.    . Vitamin D, Ergocalciferol, (DRISDOL) 1.25 MG (50000 UT) CAPS capsule Take 1 capsule (50,000 Units total) by mouth every 7 (seven) days. 12 capsule 0   No current facility-administered medications for this visit.    REVIEW OF SYSTEMS:   Constitutional: Denies fevers, chills, weight loss, or abnormal night sweats Eyes: Denies blurriness of vision, double vision or watery eyes Ears, nose, mouth, throat, and face: Denies mucositis or sore throat Respiratory: Denies cough, dyspnea or wheezes Cardiovascular: Denies palpitation, chest discomfort or lower extremity swelling Gastrointestinal:  Denies nausea, heartburn or change in bowel habits Skin: Denies abnormal skin rashes Lymphatics: Denies new lymphadenopathy or easy bruising Neurological:Denies numbness, tingling or new weaknesses (+) CIPN, feet (+) hot flashes  MSK: (+) chronic back pain, worse over 4-5 months  Behavioral/Psych: Mood is stable, no new changes  All other systems were reviewed with the patient and are negative.  PHYSICAL EXAMINATION: ECOG PERFORMANCE STATUS: 1 - Symptomatic but completely  ambulatory  Vitals:   06/16/19 1341  BP: 139/67  Pulse: 78  Resp: 18  Temp: 98.2 F (36.8 C)  SpO2: 100%   Filed Weights   06/16/19 1341  Weight: 226 lb 9.6 oz (102.8 kg)    GENERAL:alert, no distress and comfortable SKIN: no rash  EYES:  sclera clear NECK: without mass LUNGS: clear with normal breathing effort HEART: regular rate & rhythm, no lower extremity edema ABDOMEN: abdomen soft, non-tender and normal bowel sounds. large hernia noted.  Musculoskeletal: no focal spinal tenderness  PSYCH: alert & oriented x 3 with fluent speech NEURO: no focal motor/sensory deficits   LABORATORY DATA:  I have reviewed the data as listed CBC Latest Ref Rng & Units 06/16/2019 12/04/2018 11/17/2017  WBC 4.0 - 10.5 K/uL 7.0 7.7 7.0  Hemoglobin 12.0 - 15.0 g/dL 13.5 13.6 13.0  Hematocrit 36.0 - 46.0 % 41.0 41.0 38.0  Platelets 150 - 400 K/uL 319 332 309    CMP Latest Ref Rng & Units 06/16/2019 05/14/2019 04/29/2019  Glucose 70 - 99 mg/dL 312(H) - -  BUN 6 - 20 mg/dL 21(H) - -  Creatinine 0.44 - 1.00 mg/dL 1.53(H) - -  Sodium 135 - 145 mmol/L 140 - -  Potassium 3.5 - 5.1 mmol/L 4.0 - -  Chloride 98 - 111 mmol/L 99 - -  CO2 22 - 32 mmol/L 29 - -  Calcium 8.9 - 10.3 mg/dL 10.5(H) - 11.1(H)  Total Protein 6.5 - 8.1 g/dL 7.8 7.5 -  Total Bilirubin 0.3 - 1.2 mg/dL 0.7 - -  Alkaline Phos 38 - 126 U/L 59 - -  AST 15 - 41 U/L 28 - -  ALT 0 - 44 U/L 29 - -     RADIOGRAPHIC STUDIES: I have personally reviewed the radiological images as listed and agreed with the findings in the report. No results found.  ASSESSMENT & PLAN: 54 yo female with   1. Hypercalcemia, elevated serum kappa free light chains  -we reviewed her medical record in detail with the patient. She presented initially with asymptomatic hypercalcemia, work up showed non-PTH hypercalcemia, normal TSH, and vitamin D deficiency .  -SPEP showed Ig Kappa free light chain elevation to 40.2, and increased kappa/lambda ratio; M spike  was not detected  -we discussed nature of MGUS vs MM in general  -will repeat SPEP and light chains and check 24 hour Ur light chains. If further elevated we may recommend bone marrow biopsy to r/o MM. Will do bone survey to r/o bone lesions -She has worsening back pain lately, thoracic and lumber MRI were negative in 11/2018 which is encouraging -Repeat CBC today is normal, no cytopenias. CMP shows worsening renal function with Cr 1.53, uncontrolled BG 312, and Ca 10.5 -F/u pending work up   2. 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  -Ca 125 was elevated at diagnosis, normalized after surgery and chemo -last CT 11/2018 showed NED -followed by Dr. [name], next f/u in Jan or Feb per patient  -will repeat CA 125 today to r/o hypercalcemia of malignancy  -she is reportedly UTD on age-appropriate cancer screenings. She is being followed for a likely benign right breast mass which was stable in 01/2019 on mammogram/US  3. Vitamin D deficiency, 10.2 -found on work up in 05/2019 -Started 50,000 IU weekly   4. HTN, HL, DM -Continue weight loss program, f/u with dietician and PCP    PLAN: -Medical record reviewed -Repeat labs including CBC, CMP, SPEP, 24 Ur light chains, CA 125  -Bone survey in next week  -F/u pending work up    Orders Placed This Encounter  Procedures  . DG Bone Survey Met    Standing Status:   Future  Standing Expiration Date:   08/13/2020    Order Specific Question:   Reason for Exam (SYMPTOM  OR DIAGNOSIS REQUIRED)    Answer:   back pain, hypercalcemia, r/o MM    Order Specific Question:   Is patient pregnant?    Answer:   No    Order Specific Question:   Preferred imaging location?    Answer:   [facility]    Order Specific Question:   Radiology Contrast Protocol - do NOT remove file path    Answer:    [email or web address]  . Multiple Myeloma Panel (SPEP&IFE w/QIG)    Standing Status:   Standing    Number of Occurrences:   20    Standing Expiration Date:   06/15/2020  . 24 Hr Ur UIFE/Light Chains/TP QN    Standing Status:   Future    Standing Expiration Date:   06/15/2020  . CBC with Differential (Cancer Center Only)    Standing Status:   Standing    Number of Occurrences:   20    Standing Expiration Date:   06/15/2020  . CMP ([place] only)    Standing Status:   Standing    Number of Occurrences:   20    Standing Expiration Date:   06/15/2020  . CA 125    Standing Status:   Future    Number of Occurrences:   1    Standing Expiration Date:   06/15/2020   No problem-specific Assessment & Plan notes found for this encounter.    All questions were answered. The patient knows to call the clinic with any problems, questions or concerns. I spent 45 minutes counseling the patient face to face. The total time spent in the appointment was 60 minutes and more than 50% was on counseling.     Alla Feeling, NP 06/16/19 9:28 PM   Addendum  I have seen the patient, examined her. I agree with the assessment and and plan and have edited the notes.   I have reviewed her outside lab and previous cancer history.  During her work-up for hypercalcemia, she was found to have elevated kappa light chain level, no M protein detected in her blood.  She has no anemia or renal dysfunction.  She does have low back pain, which was evaluated by MRI in June 2020, it was negative. She likely has MGUS, light chain type, I do not have high suspicion for multiple myeloma.  Will repeat her SPEP with immunofixation, immunoglobulin level, serum light chain level, and 24-hour urine light chain level.  We will also obtain a bone survey. Will make a decision on bone marrow biopsy based on the above test results. We discussed MGUS is a benign blood disorder and does not require  treatment, but it has small risk of evolving to multiple myeloma so it requires monitoring.  She voiced good understanding and agrees with the above plan.  All questions were answered.  Truitt Merle  06/16/2019

## 2019-06-16 NOTE — Telephone Encounter (Signed)
Per 1/6 los Bone survey next week

## 2019-06-17 ENCOUNTER — Telehealth: Payer: Self-pay | Admitting: Nurse Practitioner

## 2019-06-17 LAB — CA 125: Cancer Antigen (CA) 125: 15.7 U/mL (ref 0.0–38.1)

## 2019-06-17 NOTE — Telephone Encounter (Signed)
Scheduled appt per 1/6 sch message - unable to reach pt . Left message with appt date and time

## 2019-06-18 LAB — MULTIPLE MYELOMA PANEL, SERUM
Albumin SerPl Elph-Mcnc: 3.8 g/dL (ref 2.9–4.4)
Albumin/Glob SerPl: 1.1 (ref 0.7–1.7)
Alpha 1: 0.2 g/dL (ref 0.0–0.4)
Alpha2 Glob SerPl Elph-Mcnc: 0.7 g/dL (ref 0.4–1.0)
B-Globulin SerPl Elph-Mcnc: 1.3 g/dL (ref 0.7–1.3)
Gamma Glob SerPl Elph-Mcnc: 1.4 g/dL (ref 0.4–1.8)
Globulin, Total: 3.6 g/dL (ref 2.2–3.9)
IgA: 199 mg/dL (ref 87–352)
IgG (Immunoglobin G), Serum: 1507 mg/dL (ref 586–1602)
IgM (Immunoglobulin M), Srm: 60 mg/dL (ref 26–217)
Total Protein ELP: 7.4 g/dL (ref 6.0–8.5)

## 2019-06-21 DIAGNOSIS — E559 Vitamin D deficiency, unspecified: Secondary | ICD-10-CM | POA: Diagnosis not present

## 2019-06-21 DIAGNOSIS — R232 Flushing: Secondary | ICD-10-CM | POA: Diagnosis not present

## 2019-06-21 DIAGNOSIS — I10 Essential (primary) hypertension: Secondary | ICD-10-CM | POA: Diagnosis not present

## 2019-06-21 DIAGNOSIS — M549 Dorsalgia, unspecified: Secondary | ICD-10-CM | POA: Diagnosis not present

## 2019-06-21 DIAGNOSIS — G8929 Other chronic pain: Secondary | ICD-10-CM | POA: Diagnosis not present

## 2019-06-21 DIAGNOSIS — R252 Cramp and spasm: Secondary | ICD-10-CM | POA: Diagnosis not present

## 2019-06-21 DIAGNOSIS — E1140 Type 2 diabetes mellitus with diabetic neuropathy, unspecified: Secondary | ICD-10-CM | POA: Diagnosis not present

## 2019-06-21 DIAGNOSIS — M1990 Unspecified osteoarthritis, unspecified site: Secondary | ICD-10-CM | POA: Diagnosis not present

## 2019-06-22 ENCOUNTER — Ambulatory Visit: Payer: Medicare Other | Admitting: Internal Medicine

## 2019-06-22 LAB — UIFE/LIGHT CHAINS/TP QN, 24-HR UR
FR KAPPA LT CH,24HR: 79.66 mg/24 hr
FR LAMBDA LT CH,24HR: 5.11 mg/24 hr
Free Kappa Lt Chains,Ur: 45.52 mg/L (ref 0.63–113.79)
Free Kappa/Lambda Ratio: 15.59 (ref 1.03–31.76)
Free Lambda Lt Chains,Ur: 2.92 mg/L (ref 0.47–11.77)
Total Protein, Urine-Ur/day: 224 mg/24 hr — ABNORMAL HIGH (ref 30–150)
Total Protein, Urine: 12.8 mg/dL
Total Volume: 1750

## 2019-06-24 ENCOUNTER — Ambulatory Visit (HOSPITAL_COMMUNITY)
Admission: RE | Admit: 2019-06-24 | Discharge: 2019-06-24 | Disposition: A | Discharge status: Home / Self Care | Payer: Medicare HMO | Source: Ambulatory Visit | Attending: Nurse Practitioner | Admitting: Nurse Practitioner

## 2019-06-24 ENCOUNTER — Encounter: Payer: Self-pay | Admitting: Internal Medicine

## 2019-06-24 ENCOUNTER — Other Ambulatory Visit: Payer: Self-pay

## 2019-06-24 ENCOUNTER — Telehealth: Payer: Self-pay | Admitting: Internal Medicine

## 2019-06-24 ENCOUNTER — Ambulatory Visit (HOSPITAL_BASED_OUTPATIENT_CLINIC_OR_DEPARTMENT_OTHER): Payer: Medicare HMO | Admitting: Internal Medicine

## 2019-06-24 DIAGNOSIS — IMO0002 Reserved for concepts with insufficient information to code with codable children: Secondary | ICD-10-CM

## 2019-06-24 DIAGNOSIS — D808 Other immunodeficiencies with predominantly antibody defects: Secondary | ICD-10-CM | POA: Diagnosis not present

## 2019-06-24 DIAGNOSIS — E1165 Type 2 diabetes mellitus with hyperglycemia: Secondary | ICD-10-CM | POA: Diagnosis not present

## 2019-06-24 DIAGNOSIS — M79675 Pain in left toe(s): Secondary | ICD-10-CM

## 2019-06-24 DIAGNOSIS — D8989 Other specified disorders involving the immune mechanism, not elsewhere classified: Secondary | ICD-10-CM | POA: Diagnosis not present

## 2019-06-24 DIAGNOSIS — N289 Disorder of kidney and ureter, unspecified: Secondary | ICD-10-CM | POA: Insufficient documentation

## 2019-06-24 DIAGNOSIS — E1142 Type 2 diabetes mellitus with diabetic polyneuropathy: Secondary | ICD-10-CM

## 2019-06-24 DIAGNOSIS — I10 Essential (primary) hypertension: Secondary | ICD-10-CM

## 2019-06-24 MED ORDER — COLCHICINE 0.6 MG PO TABS: ORAL_TABLET | ORAL | 0 refills | Status: DC

## 2019-06-24 MED ORDER — HYDROCHLOROTHIAZIDE 25 MG PO TABS: 12.5000 mg | ORAL_TABLET | Freq: Every day | ORAL | 3 refills | Status: DC

## 2019-06-24 MED ORDER — HYDRALAZINE HCL 25 MG PO TABS: 37.5000 mg | ORAL_TABLET | Freq: Three times a day (TID) | ORAL | 3 refills | Status: DC

## 2019-06-24 MED ORDER — LANTUS SOLOSTAR 100 UNIT/ML ~~LOC~~ SOPN: 18.0000 [IU] | PEN_INJECTOR | Freq: Every day | SUBCUTANEOUS | 6 refills | Status: DC

## 2019-06-24 NOTE — Progress Notes (Signed)
Virtual Visit via Telephone Note Due to current restrictions/limitations of in-office visits due to the COVID-19 pandemic, this scheduled clinical appointment was converted to a telehealth visit  I connected with Sarah [name] on 06/24/19 at 4:54 p.m by telephone and verified that I am speaking with the correct person using two identifiers. I am in my office.  The patient is at home.  Only the patient and myself participated in this encounter.  I discussed the limitations, risks, security and privacy concerns of performing an evaluation and management service by telephone and the availability of in person appointments. I also discussed with the patient that there may be a patient responsible charge related to this service. The patient expressed understanding and agreed to proceed.   History of Present Illness: DM with peripheral neuropathy(more so due to chemo), HTN, HL, OSA, midline LBP,stage IIIc low-grade serous carcinoma of the ovaryS/p BSO, omentectomy, appendectomy on 07/28/14. S/p adjuvant chemotherapy with 6 cycles of paclitaxel and carboplatin completed on 12/08/14. S/p an ex lap, hernia repair and small bowel resection for a presumed recurrence (benign pathology).Last seen 03/2019.  HyperCa+: Patient with hypercalcemia with calcium level getting up to 11.1.  PTH and PTHrP, TSH were normal.  She did have elevation in FLC so I referred her to oncology.  I also decrease hydrochlorothiazide from 37.5 to 25 mg daily.  - She saw the oncologist's nurse practitioner recently.  And repeat work-up was negative including a normal protein electrophoresis.  She also had bone survey that was normal.  Incidental finding was worsening of kidney function with decrease in GFR  HTN:  HCTZ was dec to 25 mg once a day.  She did make this change.  She reports compliance with her other blood pressure medication Not checking BP regularly but does have a device at home. No swelling in legs, CP, SOB  DM:  checks BS 2-3 times a day.  Before BF BS 160-170, before lunch 220s and bedtime 150s Reports compliance with Lantus 15, Amaryl, Metformin Eating Habits:  "not bad. Still eating about the same."  She does not drink sugary drinks, has cut back on white carbs.  Weakness is potatoe chips but has cut back on it.  Not getting in much exercise "because its cold." She has a stationary bike at home  C/o pain in LT big toe x 2 days.  There was a little redness and swelling but both have decrease without treatment.  No fever.  Hurts to put wgh on it and sore to touch.  No injury.  No previous episodes.  Mom and dad have gout.    Renal INsuff:  eGFR dec from 71 in June 2020 to 45 in 06/2019.  She is on Metformin at 1000 mg twice a day    Outpatient Encounter Medications as of 06/24/2019  Medication Sig Note  . amLODipine (NORVASC) 10 MG tablet Take 1 tablet (10 mg total) by mouth daily.   . blood glucose meter kit and supplies Dispense based on patient and insurance preference. Use up to four times daily as directed. (FOR ICD-9 250.00, 250.01).   . Blood Glucose Monitoring Suppl (ACCU-CHEK AVIVA) device Use as instructed   . diclofenac sodium (VOLTAREN) 1 % GEL Apply 2 g topically 4 (four) times daily.   Marland Kitchen gabapentin (NEURONTIN) 300 MG capsule T6AKE 1 CAPSULE BY MOUTH EVERY MORNING AND 1 CAPSULE AT NOON, AND 2 CAPSULES AT BEDTIME   . glimepiride (AMARYL) 4 MG tablet Take 1 tablet (4 mg total) by  mouth daily with breakfast.   . glucose blood (ACCU-CHEK AVIVA PLUS) test strip Use as instructed   . hydrALAZINE (APRESOLINE) 25 MG tablet Take 1 tablet (25 mg total) by mouth 3 (three) times daily.   . hydrochlorothiazide (HYDRODIURIL) 25 MG tablet Take 1 tablet (25 mg total) by mouth daily.   . Insulin Glargine (LANTUS SOLOSTAR) 100 UNIT/ML Solostar Pen Inject 15 Units into the skin daily.   . Insulin Pen Needle 29G X 12MM MISC Use as directed.   . Lancets (ACCU-CHEK MULTICLIX) lancets  07/24/2015: Received from:  External Pharmacy  . Lancets (ACCU-CHEK SOFT TOUCH) lancets Use as instructed   . Lancets Misc. (ACCU-CHEK SOFTCLIX LANCET DEV) KIT Use as directed   . latanoprost (XALATAN) 0.005 % ophthalmic solution Place 1 drop into both eyes at bedtime.   Marland Kitchen losartan (COZAAR) 50 MG tablet Take 1 tablet (50 mg total) by mouth daily.   . metFORMIN (GLUCOPHAGE) 1000 MG tablet TAKE 1 TABLET BY MOUTH TWICE DAILY WITH  A  MEAL   . metoprolol tartrate (LOPRESSOR) 25 MG tablet TAKE 1/2 TABLET BY MOUTH 2 TIMES A DAY   . rosuvastatin (CRESTOR) 20 MG tablet Take 1 tablet (20 mg total) by mouth daily. Stop Atorvastatin   . timolol (TIMOPTIC) 0.5 % ophthalmic solution Place 1 drop into both eyes daily.   . Vitamin D, Ergocalciferol, (DRISDOL) 1.25 MG (50000 UT) CAPS capsule Take 1 capsule (50,000 Units total) by mouth every 7 (seven) days.    No facility-administered encounter medications on file as of 06/24/2019.    Observations/Objective:   Chemistry      Component Value Date/Time   NA 140 06/16/2019 1459   NA 137 12/04/2018 0914   NA 138 04/22/2016 0750   K 4.0 06/16/2019 1459   K 3.9 04/22/2016 0750   CL 99 06/16/2019 1459   CO2 29 06/16/2019 1459   CO2 26 04/22/2016 0750   BUN 21 (H) 06/16/2019 1459   BUN 15 12/04/2018 0914   BUN 19.1 11/20/2016 1355   CREATININE 1.53 (H) 06/16/2019 1459   CREATININE 1.1 11/20/2016 1355      Component Value Date/Time   CALCIUM 10.5 (H) 06/16/2019 1459   CALCIUM 9.8 04/22/2016 0750   ALKPHOS 59 06/16/2019 1459   ALKPHOS 74 04/22/2016 0750   AST 28 06/16/2019 1459   AST 27 04/22/2016 0750   ALT 29 06/16/2019 1459   ALT 24 04/22/2016 0750   BILITOT 0.7 06/16/2019 1459   BILITOT 0.44 04/22/2016 0750     Lab Results  Component Value Date   WBC 7.0 06/16/2019   HGB 13.5 06/16/2019   HCT 41.0 06/16/2019   MCV 91.3 06/16/2019   PLT 319 06/16/2019    Assessment and Plan: 1. Essential hypertension Given that her calcium level came down nicely with decreased  dosing HCTZ, I suspect that HCTZ is playing a role in the hypercalcemia.  I recommend decreasing the HCTZ further to 12.5 mg daily.  We will increase the hydralazine instead. Encourage patient to check blood pressure at least twice a week with goal being 130/80 or lower - hydrALAZINE (APRESOLINE) 25 MG tablet; Take 1.5 tablets (37.5 mg total) by mouth 3 (three) times daily.  Dispense: 405 tablet; Refill: 3 - hydrochlorothiazide (HYDRODIURIL) 25 MG tablet; Take 0.5 tablets (12.5 mg total) by mouth daily.  Dispense: 90 tablet; Refill: 3  2. Uncontrolled type 2 diabetes mellitus with peripheral neuropathy ([place]) Blood sugars not at goal.  I recommend increasing Lantus to  18 units daily.  Dietary counseling given. Encouraged her to try to ride her stationary bike at least 2 to 3 days a week if only for 15 minutes. - Insulin Glargine (LANTUS SOLOSTAR) 100 UNIT/ML Solostar Pen; Inject 18 Units into the skin daily.  Dispense: 5 pen; Refill: 6 - Hemoglobin A1c; Future  3. Hypercalcemia So far work-up for MM or MGUS is negative.  TSH and PTH normal.  Vitamin D level low. See #1 above for plan  4. Toe pain, left Differential diagnoses include gout versus cellulitis.  I suspect the former.  We will give a trial of colchicine.  I request that patient upload a picture of the toe to her MyChart account for me.  Follow-up if no improvement or any worsening. - colchicine 0.6 MG tablet; 2 tabs PO x 1 dose then 1 tab PO daily for 2 days  Dispense: 4 tablet; Refill: 0  5.  Renal insuff -She is not on any NSAIDs.  Advised not to take NSAIDs as they can make kidney function worse. -Advised to drink several glasses of water daily. -We have decreased the dose of HCTZ for reason mentioned above. Return to the lab in 1 month for repeat kidney function.  Follow Up Instructions:    I discussed the assessment and treatment plan with the patient. The patient was provided an opportunity to ask questions and all were  answered. The patient agreed with the plan and demonstrated an understanding of the instructions.   The patient was advised to call back or seek an in-person evaluation if the symptoms worsen or if the condition fails to improve as anticipated.  I provided 93mnutes of non-face-to-face time during this encounter.   DKarle Plumber MD

## 2019-06-24 NOTE — Progress Notes (Signed)
Pt states on Tuesday she got a sharp pain in her big toe on left foot. Pt states it got to the point to where she can't put pressure on it

## 2019-06-26 ENCOUNTER — Encounter: Payer: Self-pay | Admitting: Registered"

## 2019-06-30 ENCOUNTER — Telehealth: Payer: Self-pay | Admitting: Nurse Practitioner

## 2019-06-30 ENCOUNTER — Ambulatory Visit: Payer: Medicare Other | Admitting: Registered"

## 2019-06-30 DIAGNOSIS — D8989 Other specified disorders involving the immune mechanism, not elsewhere classified: Secondary | ICD-10-CM

## 2019-06-30 NOTE — Telephone Encounter (Signed)
I called Ms. Biggers to review work up. Bone survey is negative for lesions or evidence of MM. CA 125 remains normal. We don't recommend bone marrow biopsy at this time. She likely has light chain MGUS. Hypercalcemia is non-PTH related. Will check 1, 25-D at next lab to see if we need to get CT to r/o sarcoidosis. Will repeat MM panel and light chains, CBC, and CMP in 3 and 6 months, f/u in 6 months after labs. Can cancel 1/28 phone f/u OK per patient. She verbalizes understanding and appreciates the call.  Cira Rue, NP  06/30/19

## 2019-07-01 ENCOUNTER — Telehealth: Payer: Self-pay | Admitting: Hematology

## 2019-07-01 NOTE — Telephone Encounter (Signed)
Scheduled appt per 1/20 sch message -  Mailed reminder letter with apt date and time

## 2019-07-03 ENCOUNTER — Encounter: Payer: Self-pay | Admitting: Internal Medicine

## 2019-07-08 ENCOUNTER — Ambulatory Visit: Payer: Medicare HMO | Admitting: Hematology

## 2019-07-14 ENCOUNTER — Other Ambulatory Visit: Payer: Self-pay

## 2019-07-14 ENCOUNTER — Ambulatory Visit
Admission: RE | Admit: 2019-07-14 | Discharge: 2019-07-14 | Disposition: A | Discharge status: Home / Self Care | Payer: Medicare Other | Source: Ambulatory Visit | Attending: Internal Medicine | Admitting: Internal Medicine

## 2019-07-14 ENCOUNTER — Ambulatory Visit
Admission: RE | Admit: 2019-07-14 | Discharge: 2019-07-14 | Disposition: A | Discharge status: Home / Self Care | Payer: Medicare HMO | Source: Ambulatory Visit | Attending: Internal Medicine | Admitting: Internal Medicine

## 2019-07-14 DIAGNOSIS — N63 Unspecified lump in unspecified breast: Secondary | ICD-10-CM

## 2019-07-14 DIAGNOSIS — R928 Other abnormal and inconclusive findings on diagnostic imaging of breast: Secondary | ICD-10-CM | POA: Diagnosis not present

## 2019-07-14 DIAGNOSIS — N6312 Unspecified lump in the right breast, upper inner quadrant: Secondary | ICD-10-CM | POA: Diagnosis not present

## 2019-07-26 DIAGNOSIS — H2513 Age-related nuclear cataract, bilateral: Secondary | ICD-10-CM | POA: Diagnosis not present

## 2019-07-26 DIAGNOSIS — H401133 Primary open-angle glaucoma, bilateral, severe stage: Secondary | ICD-10-CM | POA: Diagnosis not present

## 2019-08-09 ENCOUNTER — Other Ambulatory Visit: Payer: Self-pay | Admitting: Internal Medicine

## 2019-09-10 ENCOUNTER — Ambulatory Visit: Payer: Medicare HMO | Attending: Internal Medicine

## 2019-09-10 DIAGNOSIS — Z23 Encounter for immunization: Secondary | ICD-10-CM

## 2019-09-10 NOTE — Progress Notes (Signed)
   Covid-19 Vaccination Clinic  Name:  Sarah [name]    MRN: [id number] DOB: 1966-04-20  09/10/2019  [name] was observed post Covid-19 immunization for 15 minutes without incident. She was provided with Vaccine Information Sheet and instruction to access the V-Safe system.   [name] was instructed to call 911 with any severe reactions post vaccine: Marland Kitchen Difficulty breathing  . Swelling of face and throat  . A fast heartbeat  . A bad rash all over body  . Dizziness and weakness   Immunizations Administered    Name Date Dose VIS Date Route   Pfizer COVID-19 Vaccine 09/10/2019 10:22 AM 0.3 mL 05/21/2019 Intramuscular   Manufacturer: [employer]   Lot: [id number]   [place]: [id number]

## 2019-09-23 ENCOUNTER — Other Ambulatory Visit: Payer: Self-pay

## 2019-09-23 ENCOUNTER — Ambulatory Visit: Payer: Medicare HMO | Attending: Internal Medicine | Admitting: Internal Medicine

## 2019-09-23 ENCOUNTER — Encounter: Payer: Self-pay | Admitting: Internal Medicine

## 2019-09-23 VITALS — BP 130/78 | HR 78 | Temp 97.3°F | Resp 16 | Wt 229.0 lb

## 2019-09-23 DIAGNOSIS — N289 Disorder of kidney and ureter, unspecified: Secondary | ICD-10-CM

## 2019-09-23 DIAGNOSIS — G620 Drug-induced polyneuropathy: Secondary | ICD-10-CM

## 2019-09-23 DIAGNOSIS — I10 Essential (primary) hypertension: Secondary | ICD-10-CM | POA: Diagnosis not present

## 2019-09-23 DIAGNOSIS — E1142 Type 2 diabetes mellitus with diabetic polyneuropathy: Secondary | ICD-10-CM | POA: Diagnosis not present

## 2019-09-23 DIAGNOSIS — Z8543 Personal history of malignant neoplasm of ovary: Secondary | ICD-10-CM

## 2019-09-23 DIAGNOSIS — M79672 Pain in left foot: Secondary | ICD-10-CM | POA: Diagnosis not present

## 2019-09-23 DIAGNOSIS — E1169 Type 2 diabetes mellitus with other specified complication: Secondary | ICD-10-CM

## 2019-09-23 DIAGNOSIS — E1165 Type 2 diabetes mellitus with hyperglycemia: Secondary | ICD-10-CM

## 2019-09-23 DIAGNOSIS — IMO0002 Reserved for concepts with insufficient information to code with codable children: Secondary | ICD-10-CM

## 2019-09-23 DIAGNOSIS — E785 Hyperlipidemia, unspecified: Secondary | ICD-10-CM

## 2019-09-23 DIAGNOSIS — T451X5A Adverse effect of antineoplastic and immunosuppressive drugs, initial encounter: Secondary | ICD-10-CM

## 2019-09-23 DIAGNOSIS — E669 Obesity, unspecified: Secondary | ICD-10-CM | POA: Diagnosis not present

## 2019-09-23 DIAGNOSIS — G8929 Other chronic pain: Secondary | ICD-10-CM

## 2019-09-23 LAB — POCT GLYCOSYLATED HEMOGLOBIN (HGB A1C): HbA1c, POC (controlled diabetic range): 9.8 % — AB (ref 0.0–7.0)

## 2019-09-23 LAB — GLUCOSE, POCT (MANUAL RESULT ENTRY): POC Glucose: 227 mg/dl — AB (ref 70–99)

## 2019-09-23 MED ORDER — LANTUS SOLOSTAR 100 UNIT/ML ~~LOC~~ SOPN: 25.0000 [IU] | PEN_INJECTOR | Freq: Every day | SUBCUTANEOUS | 6 refills | Status: DC

## 2019-09-23 NOTE — Patient Instructions (Signed)
Please give patient an appointment with the clinical pharmacist in 1 month for recheck of her blood sugars.  Please bring blood sugar logs with you on your next visit with the clinical pharmacist in 1 month. Increase Lantus insulin to 25 units daily.  If after 3 days your morning blood sugars are still running higher than 130, increase Lantus to 28 units.

## 2019-09-23 NOTE — Progress Notes (Signed)
Patient ID: Sarah [name], female    DOB: 1966/03/17  MRN: [id number]  CC: Diabetes and Hypertension   Subjective: Sarah [name] is a 54 y.o. female who presents for chronic ds management Her concerns today include:  DM with peripheral neuropathy(more so due to chemo), HTN, HL, OSA, midline LBP,stage IIIc low-grade serous carcinoma of the ovaryS/p BSO, omentectomy, appendectomy on 07/28/14. S/p adjuvant chemotherapy with 6 cycles of paclitaxel and carboplatin completed on 12/08/14. S/p an ex lap, hernia repair and small bowel resection for a presumed recurrence (benign pathology).  C/o pain plantar heel of LT foot x 3-4 mths Worse when she first gets up in mornings and when she walks.  No numbness or tingling Toes tingle all the time.  She attributes to chemo-neuropathy.  This is stable.  DIABETES TYPE 2 Last A1C:   Results for orders placed or performed in visit on 09/23/19  POCT glucose (manual entry)  Result Value Ref Range   POC Glucose 227 (A) 70 - 99 mg/dl  POCT glycosylated hemoglobin (Hb A1C)  Result Value Ref Range   Hemoglobin A1C     HbA1c POC (<> result, manual entry)     HbA1c, POC (prediabetic range)     HbA1c, POC (controlled diabetic range) 9.8 (A) 0.0 - 7.0 %    Med Adherence:  [x]  Yes - Lantus 20 units which she started splitting as 10 BID on her own accord, Metformin, Amaryl    Medication side effects:  []  Yes    [x]  No Home Monitoring?  [x]  Yes TID before meals.  Does not have log    Home glucose results range:a.m 160-180, before lunch 180-190, before dinner highest 204 Diet Adherence: [x]  Yes - she has dec portion sizes    Exercise: []  Yes    [x]  No -plans to start walking now that weather is better Hypoglycemic episodes?: []  Yes    [x]  No Numbness of the feet? []  Yes    []  No Retinopathy hx? []  Yes    [x]  No Last eye exam: no blurred vision.  Had eye exam by Dr. Rolanda [facility] on 07/26/2019.  + cataracts and known glaucoma.   Told pressure stable in eye Comments:   HYPERTENSION Currently taking: see medication list Med Adherence: [x]  Yes    []  No Medication side effects: []  Yes    [x]  No Adherence with salt restriction: [x]  Yes    []  No Home Monitoring?: [x]  Yes    []  No Monitoring Frequency: once a wk.  No log today Home BP results range: Reports last reading was 113/79 SOB? []  Yes    [x]  No Chest Pain?: []  Yes    [x]  No Leg swelling?: []  Yes    [x]  No Headaches?: []  Yes    [x]  No Dizziness? []  Yes    [x]  No Comments:   HL:  Compliant with Crestor  History of ovarian CA: She follows with her oncologist every 6 months.  She thinks her next appointment is in June. Patient Active Problem List   Diagnosis Date Noted  . Renal insufficiency 06/24/2019  . Hypercalcemia 06/24/2019  . Hiatal hernia with GERD without esophagitis 06/18/2017  . Need for influenza vaccination 02/17/2017  . Ventral hernia 01/24/2016  . Type 2 diabetes mellitus without complication, without long-term current use of insulin ([place]) 10/04/2015  . Obesity (BMI 30-39.9) 10/04/2015  . Dyslipidemia 07/27/2015  . Neuropathic pain of both legs 07/27/2015  . Obesity (BMI 30.0-34.9)  12/24/2014  . Genetic testing 11/01/2014  . Constipation - functional 09/24/2014  . Chemotherapy-induced peripheral neuropathy ([place]) 09/24/2014  . Midline low back pain without sciatica 09/24/2014  . Family history of colon cancer   . Ovarian cancer, right ([place]) 07/08/2014  . Hilar adenopathy   . Essential hypertension   . Pulmonary nodule      Current Outpatient Medications on File Prior to Visit  Medication Sig Dispense Refill  . amLODipine (NORVASC) 10 MG tablet Take 1 tablet (10 mg total) by mouth daily. 90 tablet 3  . Blood Glucose Monitoring Suppl (ACCU-CHEK AVIVA PLUS) w/Device KIT USE AS DIRECTED 1 kit 0  . Blood Glucose Monitoring Suppl (ACCU-CHEK AVIVA) device Use as instructed 1 each 0  . colchicine 0.6 MG tablet 2 tabs PO x 1 dose then 1 tab  PO daily for 2 days 4 tablet 0  . diclofenac sodium (VOLTAREN) 1 % GEL Apply 2 g topically 4 (four) times daily. 100 g 3  . gabapentin (NEURONTIN) 300 MG capsule T6AKE 1 CAPSULE BY MOUTH EVERY MORNING AND 1 CAPSULE AT NOON, AND 2 CAPSULES AT BEDTIME 360 capsule 3  . glimepiride (AMARYL) 4 MG tablet Take 1 tablet (4 mg total) by mouth daily with breakfast. 90 tablet 3  . glucose blood (ACCU-CHEK AVIVA PLUS) test strip Use as instructed 100 each 12  . hydrALAZINE (APRESOLINE) 25 MG tablet Take 1.5 tablets (37.5 mg total) by mouth 3 (three) times daily. 405 tablet 3  . hydrochlorothiazide (HYDRODIURIL) 25 MG tablet Take 0.5 tablets (12.5 mg total) by mouth daily. 90 tablet 3  . Insulin Pen Needle 29G X 12MM MISC Use as directed. 100 each 6  . Lancets (ACCU-CHEK MULTICLIX) lancets   0  . Lancets (ACCU-CHEK SOFT TOUCH) lancets Use as instructed 100 each 12  . Lancets Misc. (ACCU-CHEK SOFTCLIX LANCET DEV) KIT Use as directed 1 kit 0  . latanoprost (XALATAN) 0.005 % ophthalmic solution Place 1 drop into both eyes at bedtime.    Marland Kitchen losartan (COZAAR) 50 MG tablet Take 1 tablet (50 mg total) by mouth daily. 90 tablet 3  . metFORMIN (GLUCOPHAGE) 1000 MG tablet TAKE 1 TABLET BY MOUTH TWICE DAILY WITH  A  MEAL 180 tablet 3  . metoprolol tartrate (LOPRESSOR) 25 MG tablet TAKE 1/2 TABLET BY MOUTH 2 TIMES A DAY 90 tablet 3  . rosuvastatin (CRESTOR) 20 MG tablet Take 1 tablet (20 mg total) by mouth daily. Stop Atorvastatin 90 tablet 3  . timolol (TIMOPTIC) 0.5 % ophthalmic solution Place 1 drop into both eyes daily.    . Vitamin D, Ergocalciferol, (DRISDOL) 1.25 MG (50000 UNIT) CAPS capsule TAKE 1 CAPSULE EVERY 7 DAYS 12 capsule 0  . [DISCONTINUED] blood glucose meter kit and supplies Dispense based on patient and insurance preference. Use up to four times daily as directed. (FOR ICD-9 250.00, 250.01). 1 each 0   No current facility-administered medications on file prior to visit.    Allergies  Allergen  Reactions  . Emend [Aprepitant] Other (See Comments)    Urticaria   . Lisinopril Cough    Social History   Socioeconomic History  . Marital status: Married    Spouse name: Not on file  . Number of children: Not on file  . Years of education: Not on file  . Highest education level: Not on file  Occupational History  . Not on file  Tobacco Use  . Smoking status: Never Smoker  . Smokeless tobacco: Never Used  Substance  and Sexual Activity  . Alcohol use: No  . Drug use: No  . Sexual activity: Not on file  Other Topics Concern  . Not on file  Social History Narrative  . Not on file   Social Determinants of Health   Financial Resource Strain:   . Difficulty of Paying Living Expenses:   Food Insecurity:   . Worried About Charity fundraiser in the Last Year:   . Arboriculturist in the Last Year:   Transportation Needs:   . Film/video editor (Medical):   Marland Kitchen Lack of Transportation (Non-Medical):   Physical Activity:   . Days of Exercise per Week:   . Minutes of Exercise per Session:   Stress:   . Feeling of Stress :   Social Connections:   . Frequency of Communication with Friends and Family:   . Frequency of Social Gatherings with Friends and Family:   . Attends Religious Services:   . Active Member of Clubs or Organizations:   . Attends Archivist Meetings:   Marland Kitchen Marital Status:   Intimate Partner Violence:   . Fear of Current or Ex-Partner:   . Emotionally Abused:   Marland Kitchen Physically Abused:   . Sexually Abused:     Family History  Problem Relation Age of Onset  . Hypertension Mother   . Hypertension Father   . Diabetes Father   . Cancer Sister 50       fibrosarcoma (back); currently 65  . Prostate cancer Maternal Uncle   . Colon cancer Paternal Aunt        Dx 24s; deceased 71s  . Prostate cancer Paternal Uncle        currently 37  . Cancer Paternal Uncle 31       "bone" ; unk. primary  . Stomach cancer Paternal Uncle     Past Surgical  History:  Procedure Laterality Date  . ABDOMINAL HYSTERECTOMY    . FOOT SURGERY  04/2018   [facility]  . INCISIONAL HERNIA REPAIR N/A 07/06/2015   Procedure: INCISIONAL HERNIA REPAIR ;  Surgeon: Rolm Bookbinder, MD;  Location: [facility];  Service: General;  Laterality: N/A;  . INSERTION OF MESH N/A 07/06/2015   Procedure: WITH INSERTION OF PHASIX ST MESH;  Surgeon: Rolm Bookbinder, MD;  Location: [facility];  Service: General;  Laterality: N/A;  . LAPAROTOMY N/A 07/06/2015   Procedure: EXPLORATORY LAPAROTOMY;  Surgeon: Everitt Amber, MD;  Location: [facility];  Service: Gynecology;  Laterality: N/A;  . LYSIS OF ADHESION N/A 07/06/2015   Procedure:  LYSIS OF ADHESION RESECTION OF MESENTERIC MASS BOWEL RESECTION ;  Surgeon: Everitt Amber, MD;  Location: [facility];  Service: Gynecology;  Laterality: N/A;  . ROBOTIC ASSISTED TOTAL HYSTERECTOMY WITH BILATERAL SALPINGO OOPHERECTOMY Bilateral 07/28/2014   Procedure: ROBOTIC ASSISTED lysis of adhesions with biopsies, converted to LAPAROTOMY, bilateral salpingoorphorectomy, omentectomy,appendectomy;  Surgeon: Janie Morning, MD;  Location: [facility];  Service: Gynecology;  Laterality: Bilateral;  . THYROIDECTOMY, PARTIAL      ROS: Review of Systems Negative except as stated above  PHYSICAL EXAM: BP 130/78   Pulse 78   Temp (!) 97.3 F (36.3 C)   Resp 16   Wt 229 lb (103.9 kg)   SpO2 95%   BMI 38.11 kg/m   Wt Readings from Last 3 Encounters:  09/23/19 229 lb (103.9 kg)  06/16/19 226 lb 9.6 oz (102.8 kg)  06/01/19 223 lb 8 oz (101.4 kg)    Physical Exam  General appearance - alert, well appearing, and in no distress Mental status - normal mood, behavior, speech, dress, motor activity, and thought processes Neck - supple, no significant adenopathy Chest - clear to auscultation, no wheezes, rales or rhonchi, symmetric air entry Heart - normal rate, regular rhythm, normal S1, S2, no murmurs, rubs, clicks or gallops Extremities - peripheral pulses normal,  no pedal edema, no clubbing or cyanosis MSK: No edema or erythema noted of the left foot.  No tenderness on palpation of the left heel. Diabetic Foot Exam - Simple   Simple Foot Form Visual Inspection See comments: Yes Sensation Testing Intact to touch and monofilament testing bilaterally: Yes Pulse Check Posterior Tibialis and Dorsalis pulse intact bilaterally: Yes Comments She is mildly flat-footed.      CMP Latest Ref Rng & Units 06/16/2019 05/14/2019 04/29/2019  Glucose 70 - 99 mg/dL 312(H) - -  BUN 6 - 20 mg/dL 21(H) - -  Creatinine 0.44 - 1.00 mg/dL 1.53(H) - -  Sodium 135 - 145 mmol/L 140 - -  Potassium 3.5 - 5.1 mmol/L 4.0 - -  Chloride 98 - 111 mmol/L 99 - -  CO2 22 - 32 mmol/L 29 - -  Calcium 8.9 - 10.3 mg/dL 10.5(H) - 11.1(H)  Total Protein 6.5 - 8.1 g/dL 7.8 7.5 -  Total Bilirubin 0.3 - 1.2 mg/dL 0.7 - -  Alkaline Phos 38 - 126 U/L 59 - -  AST 15 - 41 U/L 28 - -  ALT 0 - 44 U/L 29 - -   Lipid Panel     Component Value Date/Time   CHOL 166 12/04/2018 0914   TRIG 222 (H) 12/04/2018 0914   HDL 42 12/04/2018 0914   CHOLHDL 4.0 12/04/2018 0914   CHOLHDL 5.1 (H) 08/14/2016 1729   VLDL 74 (H) 08/14/2016 1729   LDLCALC 80 12/04/2018 0914    CBC    Component Value Date/Time   WBC 7.0 06/16/2019 1459   WBC 7.0 04/22/2016 0750   WBC 9.4 07/10/2015 0539   RBC 4.49 06/16/2019 1459   HGB 13.5 06/16/2019 1459   HGB 13.6 12/04/2018 0914   HGB 12.9 04/22/2016 0750   HCT 41.0 06/16/2019 1459   HCT 41.0 12/04/2018 0914   HCT 39.0 04/22/2016 0750   PLT 319 06/16/2019 1459   PLT 332 12/04/2018 0914   MCV 91.3 06/16/2019 1459   MCV 88 12/04/2018 0914   MCV 92.2 04/22/2016 0750   MCH 30.1 06/16/2019 1459   MCHC 32.9 06/16/2019 1459   RDW 12.9 06/16/2019 1459   RDW 13.6 12/04/2018 0914   RDW 13.9 04/22/2016 0750   LYMPHSABS 2.9 06/16/2019 1459   LYMPHSABS 2.7 04/22/2016 0750   MONOABS 0.7 06/16/2019 1459   MONOABS 1.0 (H) 04/22/2016 0750   EOSABS 0.1 06/16/2019  1459   EOSABS 0.1 04/22/2016 0750   BASOSABS 0.0 06/16/2019 1459   BASOSABS 0.0 04/22/2016 0750    ASSESSMENT AND PLAN: 1. Uncontrolled type 2 diabetes mellitus with peripheral neuropathy ([place]) Not at goal. Dietary counseling given. Increase Lantus insulin to 25 units daily.  Recommend that she take it as a once daily dose.  If after 3 days her morning blood sugars are still greater than 130, she should increase the Lantus insulin to 28 units daily. Keep a log of blood sugar readings and follow-up with clinical pharmacist in 1 month for recheck. - POCT glucose (manual entry) - POCT glycosylated hemoglobin (Hb A1C) - insulin glargine (LANTUS SOLOSTAR) 100 UNIT/ML Solostar Pen;  Inject 25 Units into the skin daily.  Dispense: 5 pen; Refill: 6  2. Essential hypertension At goal.  She will continue the Cozaar, hydrochlorothiazide, hydralazine and amlodipine  3. Obesity (BMI 30-39.9) See #1 above.  Encouraged her to move more.  She will start walking if only for 10 minutes a day.  4. History of ovarian cancer She will keep her upcoming 48-monthappointment with her oncologist this summer.  5. Heel pain, chronic, left Likely plantar fasciitis or heel spur. Advised on heel exercises, use of hot and cold devices and recommend purchasing a Dr. SFelicie Morngel insert for the shoes.  We will get x-ray of the foot and based on the results will refer to podiatry.  I did not prescribe an NSAID because of her kidney function. - DG Foot Complete Left; Future  6. Hyperlipidemia associated with type 2 diabetes mellitus (HCC) Continue Crestor  7. Neuropathy due to chemotherapeutic drug (HCC) Stable.  She is on gabapentin.  8. Renal insufficiency We will recheck her kidney function today. - Basic metabolic panel     Patient was given the opportunity to ask questions.  Patient verbalized understanding of the plan and was able to repeat key elements of the plan.   Orders Placed This Encounter   Procedures  . DG Foot Complete Left  . Basic metabolic panel  . POCT glucose (manual entry)  . POCT glycosylated hemoglobin (Hb A1C)     Requested Prescriptions   Signed Prescriptions Disp Refills  . insulin glargine (LANTUS SOLOSTAR) 100 UNIT/ML Solostar Pen 5 pen 6    Sig: Inject 25 Units into the skin daily.    Return in about 4 months (around 01/23/2020).  DKarle Plumber MD, FACP

## 2019-09-23 NOTE — Progress Notes (Signed)
Pt states she has been having pain in her left heel

## 2019-09-24 LAB — BASIC METABOLIC PANEL
BUN/Creatinine Ratio: 20 (ref 9–23)
BUN: 22 mg/dL (ref 6–24)
CO2: 22 mmol/L (ref 20–29)
Calcium: 12 mg/dL — ABNORMAL HIGH (ref 8.7–10.2)
Chloride: 97 mmol/L (ref 96–106)
Creatinine, Ser: 1.12 mg/dL — ABNORMAL HIGH (ref 0.57–1.00)
GFR calc Af Amer: 65 mL/min/{1.73_m2} (ref >59)
GFR calc non Af Amer: 56 mL/min/{1.73_m2} — ABNORMAL LOW (ref >59)
Glucose: 217 mg/dL — ABNORMAL HIGH (ref 65–99)
Potassium: 4.4 mmol/L (ref 3.5–5.2)
Sodium: 139 mmol/L (ref 134–144)

## 2019-09-26 ENCOUNTER — Telehealth: Payer: Self-pay | Admitting: Internal Medicine

## 2019-09-26 NOTE — Telephone Encounter (Signed)
PC placed to pt 09/24/2019 to inform that Ca level elev again and much more than before. Kidney function improved.  Pt currently asymptomatic. Level is 12.  Recommended drinking several glasses water daily and I will be back in touch with her next wk.  Would like to review her chart again in detail to see if anything stands out as cause.   Seen by [name] NP 06/2019 for eval of non-PTH mediated hyperCa with elevated Ig Kappa Free Light Chain and Kappa/Lambda Ratio.  ? light chain MGUS. MMG 06/2018 with <1 cm mass RT breast.  Stable on repeat imaging 01/2019 possibly representing an intramammary LN.  Last saw DR. [name] 11/2019 for f/u hx of ovarian CA.  No symptoms or radiologic findings of recurrence at that time.  Has Vit D def and is on high dose vit D once a wk. At this point, I will send message to [name] and her NP of recent Ca+ level; recheck PTH and rPTH,, 1.25iOH Vit D level, 25 OH Vit D, Vit A.

## 2019-09-29 ENCOUNTER — Other Ambulatory Visit: Payer: Self-pay

## 2019-09-29 ENCOUNTER — Inpatient Hospital Stay: Payer: Medicare HMO | Attending: Nurse Practitioner

## 2019-09-29 ENCOUNTER — Telehealth: Payer: Self-pay | Admitting: Internal Medicine

## 2019-09-29 DIAGNOSIS — K432 Incisional hernia without obstruction or gangrene: Secondary | ICD-10-CM | POA: Diagnosis not present

## 2019-09-29 DIAGNOSIS — D8989 Other specified disorders involving the immune mechanism, not elsewhere classified: Secondary | ICD-10-CM

## 2019-09-29 DIAGNOSIS — Z794 Long term (current) use of insulin: Secondary | ICD-10-CM | POA: Diagnosis not present

## 2019-09-29 DIAGNOSIS — Z90710 Acquired absence of both cervix and uterus: Secondary | ICD-10-CM | POA: Diagnosis not present

## 2019-09-29 DIAGNOSIS — Z08 Encounter for follow-up examination after completed treatment for malignant neoplasm: Secondary | ICD-10-CM | POA: Diagnosis not present

## 2019-09-29 DIAGNOSIS — Z9221 Personal history of antineoplastic chemotherapy: Secondary | ICD-10-CM | POA: Diagnosis not present

## 2019-09-29 DIAGNOSIS — Z8543 Personal history of malignant neoplasm of ovary: Secondary | ICD-10-CM | POA: Diagnosis not present

## 2019-09-29 DIAGNOSIS — Z90722 Acquired absence of ovaries, bilateral: Secondary | ICD-10-CM | POA: Diagnosis not present

## 2019-09-29 DIAGNOSIS — E1165 Type 2 diabetes mellitus with hyperglycemia: Secondary | ICD-10-CM | POA: Insufficient documentation

## 2019-09-29 LAB — CBC WITH DIFFERENTIAL (CANCER CENTER ONLY)
Abs Immature Granulocytes: 0.03 10*3/uL (ref 0.00–0.07)
Basophils Absolute: 0 10*3/uL (ref 0.0–0.1)
Basophils Relative: 0 %
Eosinophils Absolute: 0.1 10*3/uL (ref 0.0–0.5)
Eosinophils Relative: 1 %
HCT: 43 % (ref 36.0–46.0)
Hemoglobin: 13.8 g/dL (ref 12.0–15.0)
Immature Granulocytes: 1 %
Lymphocytes Relative: 40 %
Lymphs Abs: 2.6 10*3/uL (ref 0.7–4.0)
MCH: 29.8 pg (ref 26.0–34.0)
MCHC: 32.1 g/dL (ref 30.0–36.0)
MCV: 92.9 fL (ref 80.0–100.0)
Monocytes Absolute: 0.6 10*3/uL (ref 0.1–1.0)
Monocytes Relative: 10 %
Neutro Abs: 3.2 10*3/uL (ref 1.7–7.7)
Neutrophils Relative %: 48 %
Platelet Count: 316 10*3/uL (ref 150–400)
RBC: 4.63 MIL/uL (ref 3.87–5.11)
RDW: 13.2 % (ref 11.5–15.5)
WBC Count: 6.6 10*3/uL (ref 4.0–10.5)
nRBC: 0 % (ref 0.0–0.2)

## 2019-09-29 LAB — CMP (CANCER CENTER ONLY)
ALT: 33 U/L (ref 0–44)
AST: 38 U/L (ref 15–41)
Albumin: 3.9 g/dL (ref 3.5–5.0)
Alkaline Phosphatase: 66 U/L (ref 38–126)
Anion gap: 14 (ref 5–15)
BUN: 17 mg/dL (ref 6–20)
CO2: 24 mmol/L (ref 22–32)
Calcium: 10.6 mg/dL — ABNORMAL HIGH (ref 8.9–10.3)
Chloride: 100 mmol/L (ref 98–111)
Creatinine: 1.32 mg/dL — ABNORMAL HIGH (ref 0.44–1.00)
GFR, Est AFR Am: 53 mL/min — ABNORMAL LOW (ref >60)
GFR, Estimated: 46 mL/min — ABNORMAL LOW (ref >60)
Glucose, Bld: 317 mg/dL — ABNORMAL HIGH (ref 70–99)
Potassium: 3.9 mmol/L (ref 3.5–5.1)
Sodium: 138 mmol/L (ref 135–145)
Total Bilirubin: 0.9 mg/dL (ref 0.3–1.2)
Total Protein: 8 g/dL (ref 6.5–8.1)

## 2019-09-29 NOTE — Telephone Encounter (Signed)
-----  Message from Truitt Merle, MD sent at 09/27/2019  7:11 AM EDT ----- Regarding: RE: Hypercalcemia with elevated Light chains Dr. Wynetta Emery,  Thanks for reaching out to [place]. I have reviewed her chart again. Her UPEP was negative, serum M-protein negative, the possibility of light chain multiple myeloma is low. I saw you have ordered new labs for her, which I agree. If that's negative, I will see her back and consider bone marrow biopsy.   Thanks   Krista Blue  ----- Message ----- From: Ladell Pier, MD Sent: 09/26/2019   9:31 PM EDT To: Alla Feeling, NP, Truitt Merle, MD Subject: Hypercalcemia with elevated Light chains       I am the PCP for this pt.  You saw her 06/2019 for non-PTH mediated hypercalcemia with elevated light chains with [place] to see her back in 6 mths.  Her recent Calcium level came back more elevated at 12. You wanted her to have repeat serum light chains and 1,25 ON Vit D level this mth. I will have her return to lab to have these done but wondered if you need to see her back sooner.

## 2019-09-29 NOTE — Telephone Encounter (Signed)
Phone call placed to patient today. I informed her that I wanted to do some additional blood tests including repeating the parathyroid hormone level. I verify that she is taking vitamin D high-dose once a week as prescribed. She is not taking any multivitamin. She is not taking vitamin A supplement. My concern is possible underlaying CA.  Most common tumors causing hypercalcemia include lung, renal breast, MM and ovarian CA and lymphoma if 1,25 OH vit D level is elevated.  Informed her that I did touch base with [name]. She does not feel strongly that she has light chain MM but agrees with me in repeating some of the blood test. If calcium and light chains are still elevated she may consider doing a bone marrow biopsy. I also recommend that patient get back in with Dr. [name] for follow-up on hx of ovarian CA.  May need CT abd/pelvis and chest. Pt recently had repeat 6 mth f/u  MMG/US RT breast.  The <1 cm mass seen at the 1 o'clock position stable in size.

## 2019-09-30 ENCOUNTER — Ambulatory Visit (HOSPITAL_COMMUNITY)
Admission: RE | Admit: 2019-09-30 | Discharge: 2019-09-30 | Disposition: A | Discharge status: Home / Self Care | Payer: Medicare HMO | Source: Ambulatory Visit | Attending: Internal Medicine | Admitting: Internal Medicine

## 2019-09-30 ENCOUNTER — Telehealth: Payer: Self-pay | Admitting: *Deleted

## 2019-09-30 DIAGNOSIS — M79672 Pain in left foot: Secondary | ICD-10-CM | POA: Insufficient documentation

## 2019-09-30 DIAGNOSIS — G8929 Other chronic pain: Secondary | ICD-10-CM | POA: Insufficient documentation

## 2019-09-30 LAB — KAPPA/LAMBDA LIGHT CHAINS
Kappa free light chain: 33.6 mg/L — ABNORMAL HIGH (ref 3.3–19.4)
Kappa, lambda light chain ratio: 1.82 — ABNORMAL HIGH (ref 0.26–1.65)
Lambda free light chains: 18.5 mg/L (ref 5.7–26.3)

## 2019-09-30 NOTE — Telephone Encounter (Signed)
Patient called back and scheduled an appt for next week.

## 2019-09-30 NOTE — Telephone Encounter (Signed)
Called and left the patient a message to call the office back to schedule a follow up appt  °

## 2019-10-01 LAB — MULTIPLE MYELOMA PANEL, SERUM
Albumin SerPl Elph-Mcnc: 3.7 g/dL (ref 2.9–4.4)
Albumin/Glob SerPl: 1.1 (ref 0.7–1.7)
Alpha 1: 0.1 g/dL (ref 0.0–0.4)
Alpha2 Glob SerPl Elph-Mcnc: 0.8 g/dL (ref 0.4–1.0)
B-Globulin SerPl Elph-Mcnc: 1.3 g/dL (ref 0.7–1.3)
Gamma Glob SerPl Elph-Mcnc: 1.4 g/dL (ref 0.4–1.8)
Globulin, Total: 3.6 g/dL (ref 2.2–3.9)
IgA: 169 mg/dL (ref 87–352)
IgG (Immunoglobin G), Serum: 1414 mg/dL (ref 586–1602)
IgM (Immunoglobulin M), Srm: 57 mg/dL (ref 26–217)
Total Protein ELP: 7.3 g/dL (ref 6.0–8.5)

## 2019-10-02 ENCOUNTER — Other Ambulatory Visit: Payer: Self-pay | Admitting: Internal Medicine

## 2019-10-02 DIAGNOSIS — G8929 Other chronic pain: Secondary | ICD-10-CM

## 2019-10-04 ENCOUNTER — Ambulatory Visit: Payer: Medicare HMO | Attending: Internal Medicine

## 2019-10-04 DIAGNOSIS — Z23 Encounter for immunization: Secondary | ICD-10-CM

## 2019-10-04 NOTE — Progress Notes (Signed)
   Covid-19 Vaccination Clinic  Name:  Sarah [name]    MRN: [id number] DOB: 1965/11/17  10/04/2019  [name] was observed post Covid-19 immunization for 15 minutes without incident. She was provided with Vaccine Information Sheet and instruction to access the V-Safe system.   [name] was instructed to call 911 with any severe reactions post vaccine: Marland Kitchen Difficulty breathing  . Swelling of face and throat  . A fast heartbeat  . A bad rash all over body  . Dizziness and weakness   Immunizations Administered    Name Date Dose VIS Date Route   Pfizer COVID-19 Vaccine 10/04/2019 12:21 PM 0.3 mL 08/04/2018 Intramuscular   Manufacturer: [place]   Lot: [id number]   [place]: [id number]

## 2019-10-05 ENCOUNTER — Encounter: Payer: Self-pay | Admitting: Podiatry

## 2019-10-05 ENCOUNTER — Telehealth: Payer: Self-pay | Admitting: *Deleted

## 2019-10-05 ENCOUNTER — Ambulatory Visit (INDEPENDENT_AMBULATORY_CARE_PROVIDER_SITE_OTHER): Payer: Medicare HMO

## 2019-10-05 ENCOUNTER — Other Ambulatory Visit: Payer: Self-pay

## 2019-10-05 ENCOUNTER — Ambulatory Visit: Payer: Medicare HMO | Admitting: Podiatry

## 2019-10-05 DIAGNOSIS — M722 Plantar fascial fibromatosis: Secondary | ICD-10-CM

## 2019-10-05 MED ORDER — MELOXICAM 15 MG PO TABS
15.0000 mg | ORAL_TABLET | Freq: Every day | ORAL | 3 refills | Status: DC
Start: 2019-10-05 — End: 2019-12-28

## 2019-10-05 NOTE — Progress Notes (Signed)
She presents today for chief complaint of plantar heel pain times the past 6 months left heel. Mornings are particularly bad.  Objective: Vital signs are stable alert oriented x3. Pulses are palpable. Neurologic sensorium is intact deep tendon reflexes are intact she has pain palpation mid calcaneal tubercle of the left heel.  Radiographs demonstrate mild pes planus soft tissue increase in density at the plantar fascial pain insertion site no significant spurring noted. No open lesions or wounds are noted.  Assessment: Plantar fasciitis left foot.  Plan: Injected left heel today 20 mg Kenalog placed her in a plantar fascial brace start her on a Medrol Dosepak discussed appropriate shoe gear stretching exercise ice therapy and shoe gear modifications like to follow-up with her in 1 month.

## 2019-10-05 NOTE — Telephone Encounter (Signed)
Called and moved her appt from the afternoon to the morning

## 2019-10-05 NOTE — Patient Instructions (Signed)

## 2019-10-05 NOTE — Progress Notes (Signed)
POST-TREATMENT FOLLOWUP.   Chief Complaint: history of ovarian cancer, new diagnosis of hypercalcemia.  Assessment:   54 y.o. 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   S/p BSO, omentectomy, appendectomy on 07/28/14. S/p adjuvant chemotherapy with 6 cycles of paclitaxel and carboplatin. NED since 12/08/14. S/p an ex lap, hernia repair and small bowel resection for a presumed recurrence (benign pathology). Recurrent ventral hernia. Hypercalcemia (incidental finding).   No evidence of recurrent disease on exam,  recommend check CA 125 today.   Plan: 1) Recurrent ventral hernia:  She and [name] plan to monitor expectantly.  2) Hypercalcemia: no evidence of ovarian cancer recurrence on physical exam. Recommend CA 125 lab assessment and CT chest/abd/pelvis. These have been ordered. If cancer recurrence is diagnosed on this, will get her in to see Dr Alvy Bimler for consideration of salvage chemotherapy.  3) Treatment counseling -  She remains NED more than 5 years after completing primary therapy therefore we will suspend cancer-specific surveillance at this time.   HPI:  Sarah [name] is a 54 y.o. year old initially seen in consultation on 07/08/14 for bilateral ovarian masses and ascites. This was diagnosed at the time of admission to the hospital for hypertensive crisis. This diagnosis of hypertension was new and she was newly started on a medical regimen for this. She then underwent an exploratory laparotomy, BSO, omentectomy, appendectomy on 07/28/14 with Dr Skeet Latch without complications.  Her postoperative course was uncomplicated.  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 This represented a stage IIIc disease process.  She received 6 cycles of adjuvant chemotherapy with paclitaxel and carboplatin with Dr Marko Plume on 08/25/14 to 12/08/14.  Pretreatment CA 125  was 58 on 06/30/14, and was 20 on 12/22/14 (it's nadir had been 16 on 4/14/ 16).  Post treatment CT of the chest, abdomen and pelvis on 01/12/15 revealed: Previously noted paratracheal, subcarinal and prevascular mediastinal lymphadenopathy has resolved, with no residual pathologically enlarged mediastinal lymph nodes. Resolved hilar lymphadenopathy, with no residual pathologically enlarged mediastinal nodes. The previously described 3 mm subpleural left lower lobe pulmonary nodule is decreased to 2 mm (series 4/image 41). A separate 2 mm subpleural left lower lobe pulmonary nodule (4/29) is slightly decreased from 28m on 06/28/2014. A subpleural 3 mm right upper lobe pulmonary nodule (4/15) is unchanged from 06/28/2014. A 2 mm subpleural pulmonary nodule in the superior segment right lower lobe associated with the right major fissure (4/21) appears new. A 0.7 cm right posterior mesenteric node measures 0.7 cm short axis (series 2/image 93), decreased from 1.2 cm. No definite peritoneal tumor implants detected. A 0.4 cm nodule anterior/inferior to the spleen (series 2/ image 60) is mildly decreased from 0.7 cm. Small umbilical hernia containing a small bowel loop, with no evidence of small-bowel obstruction or strangulation.  She had a CA 125 on 03/27/15 that was normal and stable at 14.  A CT of the chest, abdomen and pelvis was performed on 04/27/15 which showed: No pulmonary mass, infiltrate, or effusion. Tiny less than 5 mm subpleural pulmonary nodules in the right upper lobe on image remain stable. No new or enlarging pulmonary nodules identified. Small left paraventral hernia containing bowel loops. New soft tissue nodularity is seen within the anterior abdominal mesenteric fat, with largest nodule measuring 10 x 17 mm. No other sites of peritoneal nodularity or soft tissue density seen.   On 07/06/15 she was  taken to the OR for an ex lap, lysis of adhesions, and small bowel resection with ventral  hernia repair. FIndings were remarkable for substantial small bowel adhesions, hwoever the mass that had been seen on imaging was not appreciated intraperitoneally. There was white nodularity and stranding on the mesentery of a segment of small bowel. It was completely resected with the small bowel resection. Pathology was benign.  Postoperatively she was noted to have very high blood glucose on acuchecks. An HbA1C was drawn on 07/07/15 and was very elevated at 10.1%. She was started on Insulin.  Her last CA 125 was 15 on 01/22/16 and 20.2 on 04/22/16 and 16.7 on 07/22/16 and 16.5 on 11/20/16.   CT abdo/pelvis for abdominal pains on 11/25/16 showed a large ventral hernia with unobstructed small bowel but no recurrence or metastases.  The patient had bunion surgery.   Interval Hx: She saw [name] for her ventral hernia. CT abd/pelvis in June, 2020 showed No findings of active malignancy. Large supraumbilical ventral hernia containing loops of small bowel and part of the transverse colon. Diffuse hepatic steatosis. Prominence of the lateral segment left hepatic lobe, which can sometimes be a morphologic features seen in early cirrhosis. Mildly prominent but stable lymph nodes along the porta hepatis, likely reactive.  She made the decision with [name] to expectantly manage the hernia as it was minimally symptomatic and already recurrent after one hernia repair.  She had hypercalcemia diagnosed on recent lab tests (asymptomatic). The etiology of this is as yet undetermined.   Current Outpatient Medications on File Prior to Visit  Medication Sig Dispense Refill  . amLODipine (NORVASC) 10 MG tablet Take 1 tablet (10 mg total) by mouth daily. 90 tablet 3  . Blood Glucose Monitoring Suppl (ACCU-CHEK AVIVA PLUS) w/Device KIT USE AS DIRECTED 1 kit 0  . Blood Glucose Monitoring Suppl (ACCU-CHEK AVIVA) device Use as instructed 1 each 0  . colchicine 0.6 MG tablet 2 tabs PO x 1 dose then 1 tab PO  daily for 2 days 4 tablet 0  . diclofenac sodium (VOLTAREN) 1 % GEL Apply 2 g topically 4 (four) times daily. 100 g 3  . dorzolamide-timolol (COSOPT) 22.3-6.8 MG/ML ophthalmic solution     . gabapentin (NEURONTIN) 300 MG capsule T6AKE 1 CAPSULE BY MOUTH EVERY MORNING AND 1 CAPSULE AT NOON, AND 2 CAPSULES AT BEDTIME 360 capsule 3  . glimepiride (AMARYL) 4 MG tablet Take 1 tablet (4 mg total) by mouth daily with breakfast. 90 tablet 3  . glucose blood (ACCU-CHEK AVIVA PLUS) test strip Use as instructed 100 each 12  . hydrALAZINE (APRESOLINE) 25 MG tablet Take 1.5 tablets (37.5 mg total) by mouth 3 (three) times daily. 405 tablet 3  . hydrochlorothiazide (HYDRODIURIL) 25 MG tablet Take 0.5 tablets (12.5 mg total) by mouth daily. 90 tablet 3  . insulin glargine (LANTUS SOLOSTAR) 100 UNIT/ML Solostar Pen Inject 25 Units into the skin daily. 5 pen 6  . Insulin Pen Needle 29G X 12MM MISC Use as directed. 100 each 6  . Lancets (ACCU-CHEK MULTICLIX) lancets   0  . Lancets (ACCU-CHEK SOFT TOUCH) lancets Use as instructed 100 each 12  . Lancets Misc. (ACCU-CHEK SOFTCLIX LANCET DEV) KIT Use as directed 1 kit 0  . latanoprost (XALATAN) 0.005 % ophthalmic solution Place 1 drop into both eyes at bedtime.    Marland Kitchen losartan (COZAAR) 50 MG tablet Take 1 tablet (50 mg total) by mouth daily. 90 tablet 3  . meloxicam (MOBIC)  15 MG tablet Take 1 tablet (15 mg total) by mouth daily. 30 tablet 3  . metformin (FORTAMET) 1000 MG (OSM) 24 hr tablet Take 1,000 mg by mouth 2 (two) times daily.    . metoprolol tartrate (LOPRESSOR) 25 MG tablet TAKE 1/2 TABLET BY MOUTH 2 TIMES A DAY 90 tablet 3  . rosuvastatin (CRESTOR) 20 MG tablet Take 1 tablet (20 mg total) by mouth daily. Stop Atorvastatin 90 tablet 3  . timolol (TIMOPTIC) 0.5 % ophthalmic solution Place 1 drop into both eyes daily.    . Travoprost, BAK Free, (TRAVATAN) 0.004 % SOLN ophthalmic solution 1 drop at bedtime.    . Vitamin D, Ergocalciferol, (DRISDOL) 1.25 MG  (50000 UNIT) CAPS capsule TAKE 1 CAPSULE EVERY 7 DAYS 12 capsule 0  . [DISCONTINUED] blood glucose meter kit and supplies Dispense based on patient and insurance preference. Use up to four times daily as directed. (FOR ICD-9 250.00, 250.01). 1 each 0   No current facility-administered medications on file prior to visit.   Allergies  Allergen Reactions  . Emend [Aprepitant] Other (See Comments)    Urticaria   . Lisinopril Cough   Past Medical History:  Diagnosis Date  . Arthritis   . Family history of colon cancer   . Glaucoma 02/16  . Glucagonoma 07/28/14   Pt denies this but states she has glaucoma  . History of chemotherapy   . Hypertension   . Imbalance   . Neuropathy    feet bilat  . Nocturia   . Peritoneal carcinomatosis ([place])    carcinoma of ovary   . Shortness of breath dyspnea    hx of 2013 - no problems currently   . Thyroid nodule    history of   . Wears glasses    Past Surgical History:  Procedure Laterality Date  . ABDOMINAL HYSTERECTOMY    . FOOT SURGERY  04/2018   [facility]  . INCISIONAL HERNIA REPAIR N/A 07/06/2015   Procedure: INCISIONAL HERNIA REPAIR ;  Surgeon: Rolm Bookbinder, MD;  Location: [facility];  Service: General;  Laterality: N/A;  . INSERTION OF MESH N/A 07/06/2015   Procedure: WITH INSERTION OF PHASIX ST MESH;  Surgeon: Rolm Bookbinder, MD;  Location: [facility];  Service: General;  Laterality: N/A;  . LAPAROTOMY N/A 07/06/2015   Procedure: EXPLORATORY LAPAROTOMY;  Surgeon: Everitt Amber, MD;  Location: [facility];  Service: Gynecology;  Laterality: N/A;  . LYSIS OF ADHESION N/A 07/06/2015   Procedure:  LYSIS OF ADHESION RESECTION OF MESENTERIC MASS BOWEL RESECTION ;  Surgeon: Everitt Amber, MD;  Location: [facility];  Service: Gynecology;  Laterality: N/A;  . ROBOTIC ASSISTED TOTAL HYSTERECTOMY WITH BILATERAL SALPINGO OOPHERECTOMY Bilateral 07/28/2014   Procedure: ROBOTIC ASSISTED lysis of adhesions with biopsies, converted to LAPAROTOMY, bilateral  salpingoorphorectomy, omentectomy,appendectomy;  Surgeon: Janie Morning, MD;  Location: [facility];  Service: Gynecology;  Laterality: Bilateral;  . THYROIDECTOMY, PARTIAL     Family History  Problem Relation Age of Onset  . Hypertension Mother   . Hypertension Father   . Diabetes Father   . Cancer Sister 50       fibrosarcoma (back); currently 46  . Prostate cancer Maternal Uncle   . Colon cancer Paternal Aunt        Dx 54s; deceased 60s  . Prostate cancer Paternal Uncle        currently 82  . Cancer Paternal Uncle 77       "bone" ; unk. primary  . Stomach cancer  Paternal Uncle    Social History   Socioeconomic History  . Marital status: Married    Spouse name: Not on file  . Number of children: Not on file  . Years of education: Not on file  . Highest education level: Not on file  Occupational History  . Not on file  Tobacco Use  . Smoking status: Never Smoker  . Smokeless tobacco: Never Used  Substance and Sexual Activity  . Alcohol use: No  . Drug use: No  . Sexual activity: Not on file  Other Topics Concern  . Not on file  Social History Narrative  . Not on file   Social Determinants of Health   Financial Resource Strain:   . Difficulty of Paying Living Expenses:   Food Insecurity:   . Worried About Charity fundraiser in the Last Year:   . Arboriculturist in the Last Year:   Transportation Needs:   . Film/video editor (Medical):   Marland Kitchen Lack of Transportation (Non-Medical):   Physical Activity:   . Days of Exercise per Week:   . Minutes of Exercise per Session:   Stress:   . Feeling of Stress :   Social Connections:   . Frequency of Communication with Friends and Family:   . Frequency of Social Gatherings with Friends and Family:   . Attends Religious Services:   . Active Member of Clubs or Organizations:   . Attends Archivist Meetings:   Marland Kitchen Marital Status:   Intimate Partner Violence:   . Fear of Current or Ex-Partner:   . Emotionally  Abused:   Marland Kitchen Physically Abused:   . Sexually Abused:     Review of systems: Constitutional:  She has no weight gain or weight loss. She has no fever or chills. Eyes: No blurred vision Ears, Nose, Mouth, Throat: No dizziness, headaches or changes in hearing. No mouth sores. Cardiovascular: No chest pain, palpitations or edema. Respiratory:  No shortness of breath, wheezing or cough Gastrointestinal: She has normal bowel movements without diarrhea or constipation. She denies any nausea or vomiting. She denies blood in her stool or heart burn. Abdominal pain Genitourinary:  She denies pelvic pain, pelvic pressure or changes in her urinary function. She has no hematuria, dysuria, or incontinence. She has no irregular vaginal bleeding or vaginal discharge Musculoskeletal: Denies muscle weakness or joint pains.  Skin:  She has no skin changes, rashes or itching Neurological:  Denies dizziness or headaches. + bilateral foot neuropathy Psychiatric:  She denies depression or anxiety. Hematologic/Lymphatic:   No easy bruising or bleeding   Physical Exam: Blood pressure 119/79, pulse 79, temperature 98.7 F (37.1 C), temperature source Temporal, resp. rate 16, height 5' (1.524 m), weight 228 lb 6 oz (103.6 kg), SpO2 97 %. General: Well dressed, well nourished in no apparent distress.   HEENT:  Normocephalic and atraumatic, no lesions.  Extraocular muscles intact. Sclerae anicteric. Pupils equal, round, reactive. No mouth sores or ulcers. Thyroid is normal size, not nodular, midline. Abdomen:  Soft, nontender, nondistended.  No palpable masses.  No hepatosplenomegaly.  No ascites. Normal bowel sounds. Incision is healed. 15cm soft, reducible midline upper abdominal ventral hernia. No erythema. Not tender to palpate. Genitourinary: No pelvic masses. Extremities: No cyanosis, clubbing or edema.  No calf tenderness or erythema. No palpable cords. Psychiatric: Mood and affect are  appropriate. Neurological: Awake, alert and oriented x 3. Sensation is intact, no neuropathy.  Musculoskeletal: No pain, normal strength and range  of motion.  Thereasa Solo, MD

## 2019-10-06 ENCOUNTER — Inpatient Hospital Stay (HOSPITAL_BASED_OUTPATIENT_CLINIC_OR_DEPARTMENT_OTHER): Payer: Medicare HMO | Admitting: Gynecologic Oncology

## 2019-10-06 ENCOUNTER — Inpatient Hospital Stay: Payer: Medicare HMO

## 2019-10-06 ENCOUNTER — Other Ambulatory Visit: Payer: Self-pay

## 2019-10-06 ENCOUNTER — Encounter: Payer: Self-pay | Admitting: Gynecologic Oncology

## 2019-10-06 VITALS — BP 119/79 | HR 79 | Temp 98.7°F | Resp 16 | Ht 60.0 in | Wt 228.4 lb

## 2019-10-06 DIAGNOSIS — Z8543 Personal history of malignant neoplasm of ovary: Secondary | ICD-10-CM | POA: Diagnosis not present

## 2019-10-06 DIAGNOSIS — C561 Malignant neoplasm of right ovary: Secondary | ICD-10-CM

## 2019-10-06 DIAGNOSIS — K432 Incisional hernia without obstruction or gangrene: Secondary | ICD-10-CM | POA: Diagnosis not present

## 2019-10-06 DIAGNOSIS — Z9221 Personal history of antineoplastic chemotherapy: Secondary | ICD-10-CM | POA: Diagnosis not present

## 2019-10-06 DIAGNOSIS — Z90710 Acquired absence of both cervix and uterus: Secondary | ICD-10-CM | POA: Diagnosis not present

## 2019-10-06 DIAGNOSIS — Z794 Long term (current) use of insulin: Secondary | ICD-10-CM | POA: Diagnosis not present

## 2019-10-06 DIAGNOSIS — Z90722 Acquired absence of ovaries, bilateral: Secondary | ICD-10-CM | POA: Diagnosis not present

## 2019-10-06 DIAGNOSIS — Z08 Encounter for follow-up examination after completed treatment for malignant neoplasm: Secondary | ICD-10-CM

## 2019-10-06 DIAGNOSIS — E1165 Type 2 diabetes mellitus with hyperglycemia: Secondary | ICD-10-CM | POA: Diagnosis not present

## 2019-10-06 DIAGNOSIS — E213 Hyperparathyroidism, unspecified: Secondary | ICD-10-CM

## 2019-10-06 DIAGNOSIS — D8989 Other specified disorders involving the immune mechanism, not elsewhere classified: Secondary | ICD-10-CM

## 2019-10-06 LAB — CBC WITH DIFFERENTIAL (CANCER CENTER ONLY)
Abs Immature Granulocytes: 0.03 10*3/uL (ref 0.00–0.07)
Basophils Absolute: 0 10*3/uL (ref 0.0–0.1)
Basophils Relative: 0 %
Eosinophils Absolute: 0.1 10*3/uL (ref 0.0–0.5)
Eosinophils Relative: 1 %
HCT: 41.5 % (ref 36.0–46.0)
Hemoglobin: 13.5 g/dL (ref 12.0–15.0)
Immature Granulocytes: 1 %
Lymphocytes Relative: 42 %
Lymphs Abs: 2.6 10*3/uL (ref 0.7–4.0)
MCH: 29.9 pg (ref 26.0–34.0)
MCHC: 32.5 g/dL (ref 30.0–36.0)
MCV: 92 fL (ref 80.0–100.0)
Monocytes Absolute: 0.8 10*3/uL (ref 0.1–1.0)
Monocytes Relative: 13 %
Neutro Abs: 2.6 10*3/uL (ref 1.7–7.7)
Neutrophils Relative %: 43 %
Platelet Count: 268 10*3/uL (ref 150–400)
RBC: 4.51 MIL/uL (ref 3.87–5.11)
RDW: 13.2 % (ref 11.5–15.5)
WBC Count: 6.1 10*3/uL (ref 4.0–10.5)
nRBC: 0 % (ref 0.0–0.2)

## 2019-10-06 LAB — CMP (CANCER CENTER ONLY)
ALT: 32 U/L (ref 0–44)
AST: 33 U/L (ref 15–41)
Albumin: 3.8 g/dL (ref 3.5–5.0)
Alkaline Phosphatase: 58 U/L (ref 38–126)
Anion gap: 14 (ref 5–15)
BUN: 17 mg/dL (ref 6–20)
CO2: 25 mmol/L (ref 22–32)
Calcium: 10.9 mg/dL — ABNORMAL HIGH (ref 8.9–10.3)
Chloride: 102 mmol/L (ref 98–111)
Creatinine: 1.31 mg/dL — ABNORMAL HIGH (ref 0.44–1.00)
GFR, Est AFR Am: 54 mL/min — ABNORMAL LOW (ref >60)
GFR, Estimated: 46 mL/min — ABNORMAL LOW (ref >60)
Glucose, Bld: 316 mg/dL — ABNORMAL HIGH (ref 70–99)
Potassium: 4 mmol/L (ref 3.5–5.1)
Sodium: 141 mmol/L (ref 135–145)
Total Bilirubin: 0.7 mg/dL (ref 0.3–1.2)
Total Protein: 7.7 g/dL (ref 6.5–8.1)

## 2019-10-06 NOTE — Patient Instructions (Signed)
Dr [name] has ordered a CT scan to rule out recurrence of ovarian cancer as the source of your hypercalcemia. She has also ordered a CA 125 lab test.  If these are negative for signs of recurrence, you no longer require regular ovarian cancer checkups because it has been 5 years since you finished treatment with no evidence of recurrence in that time.  It is reasonable to follow-up with Dr Wynetta Emery for ongoing wellness checks.

## 2019-10-07 LAB — VITAMIN D 1,25 DIHYDROXY
Vitamin D 1, 25 (OH)2 Total: 49 pg/mL
Vitamin D2 1, 25 (OH)2: 48 pg/mL
Vitamin D3 1, 25 (OH)2: <10 pg/mL

## 2019-10-11 ENCOUNTER — Telehealth: Payer: Self-pay | Admitting: *Deleted

## 2019-10-11 NOTE — Telephone Encounter (Signed)
-----   Message from Truitt Merle, MD sent at 10/10/2019 12:54 PM EDT ----- Please let pt know her lab results, Kappa light chain level slightly elevated, less than before. BG high, CKD and hypercalcemia stable. No other concerns, thanks   Truitt Merle  10/10/2019

## 2019-10-11 NOTE — Telephone Encounter (Signed)
Per Dr.Feng, called to make pt aware of her Kappa light chains being slightly elevated, but less than before. Also advised blood sugar was high and CKD and hypercalcemia stable. Pt verbalized understanding

## 2019-10-13 ENCOUNTER — Encounter (HOSPITAL_COMMUNITY): Payer: Self-pay

## 2019-10-13 ENCOUNTER — Telehealth: Payer: Self-pay | Admitting: Internal Medicine

## 2019-10-13 ENCOUNTER — Other Ambulatory Visit: Payer: Self-pay

## 2019-10-13 ENCOUNTER — Ambulatory Visit (HOSPITAL_COMMUNITY)
Admission: RE | Admit: 2019-10-13 | Discharge: 2019-10-13 | Disposition: A | Discharge status: Home / Self Care | Payer: Medicare HMO | Source: Ambulatory Visit | Attending: Gynecologic Oncology | Admitting: Gynecologic Oncology

## 2019-10-13 DIAGNOSIS — C569 Malignant neoplasm of unspecified ovary: Secondary | ICD-10-CM | POA: Diagnosis not present

## 2019-10-13 DIAGNOSIS — E213 Hyperparathyroidism, unspecified: Secondary | ICD-10-CM | POA: Diagnosis not present

## 2019-10-13 DIAGNOSIS — E041 Nontoxic single thyroid nodule: Secondary | ICD-10-CM

## 2019-10-13 DIAGNOSIS — R911 Solitary pulmonary nodule: Secondary | ICD-10-CM | POA: Diagnosis not present

## 2019-10-13 DIAGNOSIS — K760 Fatty (change of) liver, not elsewhere classified: Secondary | ICD-10-CM | POA: Diagnosis not present

## 2019-10-13 DIAGNOSIS — C561 Malignant neoplasm of right ovary: Secondary | ICD-10-CM | POA: Diagnosis not present

## 2019-10-13 MED ORDER — IOHEXOL 300 MG/ML  SOLN
100.0000 mL | Freq: Once | INTRAMUSCULAR | Status: AC | PRN
  Administered 2019-10-13: 100 mL via INTRAVENOUS

## 2019-10-13 MED ORDER — SODIUM CHLORIDE (PF) 0.9 % IJ SOLN
INTRAMUSCULAR | Status: AC
  Filled 2019-10-13: qty 50

## 2019-10-13 NOTE — Telephone Encounter (Signed)
Contacted pt to go over appointment information for [place]. Pt is schedule for ultrasound on 5/14 at 130pm at [facility] pt will need to arrive at 115pm. Pt is aware and doesn't have any questions or concerns

## 2019-10-13 NOTE — Telephone Encounter (Signed)
Phone call placed to patient today.  Patient informed that the labs that I had ordered on her last month and had requested that they be drawn at the same time when she had labs drawn for Dr. Annamaria Boots were not done.  Requested that she return to the lab at her convenience to have this done. I see that she had seen Dr. [name] and had CAT scan of abdomen and pelvis done today.  I reviewed those results.  It showed an incidental finding of a nodule 1.7 cm on the right thyroid gland.  Radiologist recommended thyroid ultrasound.  Incidental finding also of right lower lobe lung nodule that has increased in size from 4 mm to 11 mm when compared to imaging that was done back in 2016.  He reported that it is unusual for ovarian cancer to metastasize to the lung but recommended doing a PET scan to further evaluate.  I told patient that if it is okay with her we will go ahead and order and she is agreeable to that.

## 2019-10-14 ENCOUNTER — Other Ambulatory Visit: Payer: Self-pay | Admitting: Gynecologic Oncology

## 2019-10-14 ENCOUNTER — Telehealth: Payer: Self-pay

## 2019-10-14 DIAGNOSIS — R911 Solitary pulmonary nodule: Secondary | ICD-10-CM

## 2019-10-14 DIAGNOSIS — C561 Malignant neoplasm of right ovary: Secondary | ICD-10-CM

## 2019-10-14 DIAGNOSIS — E041 Nontoxic single thyroid nodule: Secondary | ICD-10-CM

## 2019-10-14 NOTE — Telephone Encounter (Signed)
Told Sarah [name] that her PET scan is scheduled for Tuesday May 18,2021 at 1100. Arrive at [facility] radiology at 1030. She can eat and drink water only until  5 am. She can take her oral diabetic meds in the am. She takes her insulin in the evening. She can take her dose the evening prior to PET scan per [facility] in Nuclear Med. Sarah [name] verbalized understanding.

## 2019-10-14 NOTE — Telephone Encounter (Signed)
Told Sarah [name] that Dr. [name] saw the note regarding her PCP Dr. Wynetta Emery reviewed the CT results with her. Dr. [name] has ordered the PET scan to follow up on the pulmonary lung nodule.

## 2019-10-14 NOTE — Telephone Encounter (Signed)
Informed patient about CT scan results. Will obtain PET/CT to better evaluate the pulmonary nodule and a thyroid US to evaluate the thyroid nodule.  Thereasa Solo, MD

## 2019-10-14 NOTE — Progress Notes (Signed)
PET scan ordered per recommendations on CT

## 2019-10-21 ENCOUNTER — Ambulatory Visit: Payer: Medicare HMO | Attending: Internal Medicine | Admitting: Pharmacist

## 2019-10-21 ENCOUNTER — Other Ambulatory Visit: Payer: Self-pay

## 2019-10-21 DIAGNOSIS — E1165 Type 2 diabetes mellitus with hyperglycemia: Secondary | ICD-10-CM | POA: Diagnosis not present

## 2019-10-21 DIAGNOSIS — IMO0002 Reserved for concepts with insufficient information to code with codable children: Secondary | ICD-10-CM

## 2019-10-21 DIAGNOSIS — E1142 Type 2 diabetes mellitus with diabetic polyneuropathy: Secondary | ICD-10-CM

## 2019-10-21 NOTE — Progress Notes (Signed)
    S:    PCP: Dr. Wynetta Emery   No chief complaint on file.  Patient arrives in good spirits.  Presents for diabetes evaluation, education, and management Patient was referred and last seen by Primary Care Provider on 09/23/2019. Insulin dose was increased at that visit.  Family/Social History:  FHx: DM, HTN Tobacco: never smoker  Alcohol: denies use    Insurance coverage/medication affordability: [employer]  Medication adherence reported.   Current diabetes medications include: metformin 1000 mg BID, glimepiride 4 mg daily, Lantus 25 units daily   Patient denies hypoglycemic events.  Patient reported dietary habits: - Reports trying to better with her diet   Patient-reported exercise habits:  - Walks daily; when she settles, she notices increased pain in her toes    Patient reports polydipsia, nocturia.  Patient reports neuropathy (nerve pain) in her toes but reports this is due to arthritis. Additionally, her chart shows neuropathy d/t chemo.  Patient denies visual changes. Patient denies self foot exams.     O:  POCT: 222 (post-prandial)  Lab Results  Component Value Date   HGBA1C 9.8 (A) 09/23/2019   There were no vitals filed for this visit.  Lipid Panel     Component Value Date/Time   CHOL 166 12/04/2018 0914   TRIG 222 (H) 12/04/2018 0914   HDL 42 12/04/2018 0914   CHOLHDL 4.0 12/04/2018 0914   CHOLHDL 5.1 (H) 08/14/2016 1729   VLDL 74 (H) 08/14/2016 1729   LDLCALC 80 12/04/2018 0914    Home fasting blood sugars: 180s-250s mostly. Occasional lower 100s but this occurs seldomly.    Clinical Atherosclerotic Cardiovascular Disease (ASCVD): No  The 10-year ASCVD risk score Mikey Bussing DC Jr., et al., 2013) is: 8.7%   Values used to calculate the score:     Age: 54 years     Sex: Female     Is Non-Hispanic African American: Yes     Diabetic: Yes     Tobacco smoker: No     Systolic Blood Pressure: 123456 mmHg     Is BP treated: Yes     HDL Cholesterol: 42 mg/dL      Total Cholesterol: 166 mg/dL    A/P: Diabetes longstanding currently uncontrolled. Patient is able to verbalize appropriate hypoglycemia management plan. Medication adherence: reports. Control is suboptimal due to some continued dietary indiscretion. -Increased dose of Lantus to 30 units daily. Instructions given to increase by 2 units every 3 days until FBG < 130 or max of 40 units.   -Continued other medications.  -Extensively discussed pathophysiology of diabetes, recommended lifestyle interventions, dietary effects on blood sugar control -Counseled on s/sx of and management of hypoglycemia -Next A1C anticipated 12/2019.   Written patient instructions provided.  Total time in face to face counseling 30 minutes.   Follow up Pharmacist Clinic Visit in 1 month.    Benard Halsted, PharmD, [place] [phone]

## 2019-10-22 ENCOUNTER — Encounter: Payer: Self-pay | Admitting: Pharmacist

## 2019-10-22 ENCOUNTER — Other Ambulatory Visit: Payer: Self-pay

## 2019-10-22 ENCOUNTER — Ambulatory Visit (HOSPITAL_COMMUNITY)
Admission: RE | Admit: 2019-10-22 | Discharge: 2019-10-22 | Disposition: A | Discharge status: Home / Self Care | Payer: Medicare HMO | Source: Ambulatory Visit | Attending: Internal Medicine | Admitting: Internal Medicine

## 2019-10-22 DIAGNOSIS — E042 Nontoxic multinodular goiter: Secondary | ICD-10-CM | POA: Diagnosis not present

## 2019-10-22 DIAGNOSIS — E041 Nontoxic single thyroid nodule: Secondary | ICD-10-CM

## 2019-10-22 MED ORDER — LANTUS SOLOSTAR 100 UNIT/ML ~~LOC~~ SOPN: PEN_INJECTOR | SUBCUTANEOUS | 2 refills | Status: DC

## 2019-10-26 ENCOUNTER — Encounter (HOSPITAL_COMMUNITY)
Admission: RE | Admit: 2019-10-26 | Discharge: 2019-10-26 | Disposition: A | Discharge status: Home / Self Care | Payer: Medicare HMO | Source: Ambulatory Visit | Attending: Gynecologic Oncology | Admitting: Gynecologic Oncology

## 2019-10-26 ENCOUNTER — Other Ambulatory Visit: Payer: Self-pay

## 2019-10-26 DIAGNOSIS — C561 Malignant neoplasm of right ovary: Secondary | ICD-10-CM | POA: Insufficient documentation

## 2019-10-26 DIAGNOSIS — R911 Solitary pulmonary nodule: Secondary | ICD-10-CM | POA: Insufficient documentation

## 2019-10-26 DIAGNOSIS — I700 Atherosclerosis of aorta: Secondary | ICD-10-CM | POA: Insufficient documentation

## 2019-10-26 DIAGNOSIS — Z8543 Personal history of malignant neoplasm of ovary: Secondary | ICD-10-CM | POA: Diagnosis not present

## 2019-10-26 DIAGNOSIS — K760 Fatty (change of) liver, not elsewhere classified: Secondary | ICD-10-CM | POA: Diagnosis not present

## 2019-10-26 DIAGNOSIS — E041 Nontoxic single thyroid nodule: Secondary | ICD-10-CM | POA: Insufficient documentation

## 2019-10-26 LAB — GLUCOSE, CAPILLARY: Glucose-Capillary: 193 mg/dL — ABNORMAL HIGH (ref 70–99)

## 2019-10-26 MED ORDER — FLUDEOXYGLUCOSE F - 18 (FDG) INJECTION
11.1000 | Freq: Once | INTRAVENOUS | Status: AC | PRN
  Administered 2019-10-26: 11.1 via INTRAVENOUS

## 2019-10-27 ENCOUNTER — Encounter: Payer: Self-pay | Admitting: Gynecologic Oncology

## 2019-10-27 ENCOUNTER — Encounter: Payer: Self-pay | Admitting: Internal Medicine

## 2019-10-28 NOTE — Telephone Encounter (Signed)
Called Ms Shahan and told her that Dr. [name] needs to review her scan and then she will be called to discuss the results. It may not be today as Dr. [name] is in the OR.  Joylene John, NP has it on Dr. Serita Grit desk for review. Pt appreciated the call.

## 2019-10-29 ENCOUNTER — Telehealth: Payer: Self-pay

## 2019-10-29 NOTE — Telephone Encounter (Signed)
Told Sarah [name] that Dr. [name] said that she will discuss the PET Scan results with her on Monday 11-01-19 in a phone visit at 1000. Pt verbalized understanding.

## 2019-11-01 ENCOUNTER — Encounter: Payer: Self-pay | Admitting: Gynecologic Oncology

## 2019-11-01 ENCOUNTER — Inpatient Hospital Stay: Payer: Medicare HMO | Attending: Nurse Practitioner | Admitting: Gynecologic Oncology

## 2019-11-01 DIAGNOSIS — C561 Malignant neoplasm of right ovary: Secondary | ICD-10-CM

## 2019-11-01 DIAGNOSIS — E041 Nontoxic single thyroid nodule: Secondary | ICD-10-CM

## 2019-11-01 DIAGNOSIS — R911 Solitary pulmonary nodule: Secondary | ICD-10-CM | POA: Diagnosis not present

## 2019-11-01 NOTE — Progress Notes (Signed)
Gynecologic Oncology Telehealth Follow-up Note  I connected with Sarah [name] on 11/01/19 at 10:00 AM EDT by telephone and verified that I am speaking with the correct person using two identifiers.  I discussed the limitations, risks, security and privacy concerns of performing an evaluation and management service by telemedicine and the availability of in-person appointments. I also discussed with the patient that there may be a patient responsible charge related to this service. The patient expressed understanding and agreed to proceed.  Other persons participating in the visit and their role in the encounter: none.  Patient's location: home Provider's location: [place]  Chief Complaint:  Chief Complaint  Patient presents with  . Lung Lesion    history of ovarian cancer   Chief Complaint: history of ovarian cancer, new diagnosis of hypercalcemia.  Assessment:   54 y.o. 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   S/p BSO, omentectomy, appendectomy on 07/28/14. S/p adjuvant chemotherapy with 6 cycles of paclitaxel and carboplatin. NED since 12/08/14. S/p an ex lap, hernia repair and small bowel resection for a presumed recurrence (benign pathology). Recurrent ventral hernia. Hypercalcemia (incidental finding).  77m right lower lobe lung nodule - negative on PET.    Plan: She does not appear to have recurrent ovarian cancer as a cause for her hypercalcemia. She is >5years from completion of primary therapy, and therefore has completed surveillance for her ovarian cancer. We discussed that she does not need future follow-up with me.  However, she does have hypercalcemia and a lung nodule which has slowly grown (775min 5 years). I will reach out to her PCP and the pulmonary oncology team to see if this is best evaluated/monitored with serial imaging versus biopsy. I will hand off follow-up of this nodule to her PCP.   HPI:  Sarah [name] a [age].o. year old initially seen in consultation on 07/08/14 for bilateral ovarian masses and ascites. This was diagnosed at the time of admission to the hospital for hypertensive crisis. This diagnosis of hypertension was new and she was newly started on a medical regimen for this. She then underwent an exploratory laparotomy, BSO, omentectomy, appendectomy on 07/28/14 with Dr BrSkeet Latchithout complications.  Her postoperative course was uncomplicated.  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 This represented a stage IIIc disease process.  She received 6 cycles of adjuvant chemotherapy with paclitaxel and carboplatin with Dr LiMarko Plumen 08/25/14 to 12/08/14.  Pretreatment CA 125 was 58 on 06/30/14, and was 20 on 12/22/14 (it's nadir had been 16 on 4/14/ 16).  Post treatment CT of the chest, abdomen and pelvis on 01/12/15 revealed: Previously noted paratracheal, subcarinal and prevascular mediastinal lymphadenopathy has resolved, with no residual pathologically enlarged mediastinal lymph nodes. Resolved hilar lymphadenopathy, with no residual pathologically enlarged mediastinal nodes. The previously described 3 mm subpleural left lower lobe pulmonary nodule is decreased to 2 mm (series 4/image 41). A separate 2 mm subpleural left lower lobe pulmonary nodule (4/29) is slightly decreased from 49m46mn 06/28/2014. A subpleural 3 mm right upper lobe pulmonary nodule (4/15) is unchanged from 06/28/2014. A 2 mm subpleural pulmonary nodule in the superior segment right lower lobe associated with the right major fissure (4/21) appears new. A 0.7 cm right posterior mesenteric node measures 0.7 cm short axis (series 2/image 93), decreased from 1.2 cm. No definite peritoneal tumor implants detected. A 0.4  cm nodule anterior/inferior to the spleen (series 2/ image 60) is mildly decreased from 0.7 cm. Small umbilical  hernia containing a small bowel loop, with no evidence of small-bowel obstruction or strangulation.  She had a CA 125 on 03/27/15 that was normal and stable at 14.  A CT of the chest, abdomen and pelvis was performed on 04/27/15 which showed: No pulmonary mass, infiltrate, or effusion. Tiny less than 5 mm subpleural pulmonary nodules in the right upper lobe on image remain stable. No new or enlarging pulmonary nodules identified. Small left paraventral hernia containing bowel loops. New soft tissue nodularity is seen within the anterior abdominal mesenteric fat, with largest nodule measuring 10 x 17 mm. No other sites of peritoneal nodularity or soft tissue density seen.   On 07/06/15 she was taken to the OR for an ex lap, lysis of adhesions, and small bowel resection with ventral hernia repair. FIndings were remarkable for substantial small bowel adhesions, hwoever the mass that had been seen on imaging was not appreciated intraperitoneally. There was white nodularity and stranding on the mesentery of a segment of small bowel. It was completely resected with the small bowel resection. Pathology was benign.  Postoperatively she was noted to have very high blood glucose on acuchecks. An HbA1C was drawn on 07/07/15 and was very elevated at 10.1%. She was started on Insulin.  Her last CA 125 was 15 on 01/22/16 and 20.2 on 04/22/16 and 16.7 on 07/22/16 and 16.5 on 11/20/16.   CT abdo/pelvis for abdominal pains on 11/25/16 showed a large ventral hernia with unobstructed small bowel but no recurrence or metastases.  The patient had bunion surgery.   She saw [name] for her ventral hernia. CT abd/pelvis in June, 2020 showed No findings of active malignancy. Large supraumbilical ventral hernia containing loops of small bowel and part of the transverse colon. Diffuse hepatic steatosis. Prominence of the lateral segment left hepatic lobe, which can sometimes be a morphologic features seen in early cirrhosis.  Mildly prominent but stable lymph nodes along the porta hepatis, likely reactive.  She made the decision with [name] to expectantly manage the hernia as it was minimally symptomatic and already recurrent after one hernia repair.  She had hypercalcemia diagnosed on lab tests in April, 2021(asymptomatic). Her PCP sent her to be evaluated by me in April to rule out recurrent ovarian cancer as the etiology. CA 125 was normal at 15.7 in January, 2021. Physical exam was normal without signs of recurrence.   Interval Hx: She underwent CT chest/abd/pelvis on 10/13/19 which showed 11 mm central right lower lobe pulmonary nodule has increased from 4 mm on 01/12/2015. While somewhat unusual for metastatic ovarian cancer, primary bronchogenic neoplasm would also be a consideration. This may possibly be of bronchial origin (carcinoid). No evidence for metastatic disease in the abdomen or pelvis. Prominent portal caval lymph node is unchanged since 11/26/2018. A 1.7 cm right thyroid nodule. Thyroid ultrasound recommended.  Follow-up thyroid ultrasound on 10/22/19 showed residual right lobe of the thyroid demonstrates spongiform and solid/cystic nodular areas that do not meet criteria for biopsy or further follow-up.  PET on 5/18/21to evaluate the area on CT showed an 11 mm central right lower lobe pulmonary nodule shows no hypermetabolism. While this is reassuring, well differentiated or low-grade neoplasm can be poorly FDG avid and close continued follow-up recommended.  Current Outpatient Medications on File Prior to Visit  Medication Sig Dispense Refill  . amLODipine (NORVASC) 10 MG tablet Take 1 tablet (  10 mg total) by mouth daily. 90 tablet 3  . Blood Glucose Monitoring Suppl (ACCU-CHEK AVIVA PLUS) w/Device KIT USE AS DIRECTED 1 kit 0  . Blood Glucose Monitoring Suppl (ACCU-CHEK AVIVA) device Use as instructed 1 each 0  . colchicine 0.6 MG tablet 2 tabs PO x 1 dose then 1 tab PO daily for 2 days 4  tablet 0  . diclofenac sodium (VOLTAREN) 1 % GEL Apply 2 g topically 4 (four) times daily. 100 g 3  . dorzolamide-timolol (COSOPT) 22.3-6.8 MG/ML ophthalmic solution     . gabapentin (NEURONTIN) 300 MG capsule T6AKE 1 CAPSULE BY MOUTH EVERY MORNING AND 1 CAPSULE AT NOON, AND 2 CAPSULES AT BEDTIME 360 capsule 3  . glimepiride (AMARYL) 4 MG tablet Take 1 tablet (4 mg total) by mouth daily with breakfast. 90 tablet 3  . glucose blood (ACCU-CHEK AVIVA PLUS) test strip Use as instructed 100 each 12  . hydrALAZINE (APRESOLINE) 25 MG tablet Take 1.5 tablets (37.5 mg total) by mouth 3 (three) times daily. 405 tablet 3  . hydrochlorothiazide (HYDRODIURIL) 25 MG tablet Take 0.5 tablets (12.5 mg total) by mouth daily. 90 tablet 3  . insulin glargine (LANTUS SOLOSTAR) 100 UNIT/ML Solostar Pen Inject 30 units daily. Increase by 2 units every 3 days until morning sugars are < 130. Max = 40 units. 15 mL 2  . Insulin Pen Needle 29G X 12MM MISC Use as directed. 100 each 6  . Lancets (ACCU-CHEK MULTICLIX) lancets   0  . Lancets (ACCU-CHEK SOFT TOUCH) lancets Use as instructed 100 each 12  . Lancets Misc. (ACCU-CHEK SOFTCLIX LANCET DEV) KIT Use as directed 1 kit 0  . latanoprost (XALATAN) 0.005 % ophthalmic solution Place 1 drop into both eyes at bedtime.    Marland Kitchen losartan (COZAAR) 50 MG tablet Take 1 tablet (50 mg total) by mouth daily. 90 tablet 3  . meloxicam (MOBIC) 15 MG tablet Take 1 tablet (15 mg total) by mouth daily. 30 tablet 3  . metformin (FORTAMET) 1000 MG (OSM) 24 hr tablet Take 1,000 mg by mouth 2 (two) times daily.    . metoprolol tartrate (LOPRESSOR) 25 MG tablet TAKE 1/2 TABLET BY MOUTH 2 TIMES A DAY 90 tablet 3  . rosuvastatin (CRESTOR) 20 MG tablet Take 1 tablet (20 mg total) by mouth daily. Stop Atorvastatin 90 tablet 3  . timolol (TIMOPTIC) 0.5 % ophthalmic solution Place 1 drop into both eyes daily.    . Travoprost, BAK Free, (TRAVATAN) 0.004 % SOLN ophthalmic solution 1 drop at bedtime.    .  Vitamin D, Ergocalciferol, (DRISDOL) 1.25 MG (50000 UNIT) CAPS capsule TAKE 1 CAPSULE EVERY 7 DAYS 12 capsule 0  . [DISCONTINUED] blood glucose meter kit and supplies Dispense based on patient and insurance preference. Use up to four times daily as directed. (FOR ICD-9 250.00, 250.01). 1 each 0   No current facility-administered medications on file prior to visit.   Allergies  Allergen Reactions  . Emend [Aprepitant] Other (See Comments)    Urticaria   . Lisinopril Cough   Past Medical History:  Diagnosis Date  . Arthritis   . Family history of colon cancer   . Glaucoma 02/16  . Glucagonoma 07/28/14   Pt denies this but states she has glaucoma  . History of chemotherapy   . Hypertension   . Imbalance   . Neuropathy    feet bilat  . Nocturia   . Peritoneal carcinomatosis ([place])    carcinoma of ovary   .  Shortness of breath dyspnea    hx of 2013 - no problems currently   . Thyroid nodule    history of   . Wears glasses    Past Surgical History:  Procedure Laterality Date  . ABDOMINAL HYSTERECTOMY    . FOOT SURGERY  04/2018   [facility]  . INCISIONAL HERNIA REPAIR N/A 07/06/2015   Procedure: INCISIONAL HERNIA REPAIR ;  Surgeon: Rolm Bookbinder, MD;  Location: [facility];  Service: General;  Laterality: N/A;  . INSERTION OF MESH N/A 07/06/2015   Procedure: WITH INSERTION OF PHASIX ST MESH;  Surgeon: Rolm Bookbinder, MD;  Location: [facility];  Service: General;  Laterality: N/A;  . LAPAROTOMY N/A 07/06/2015   Procedure: EXPLORATORY LAPAROTOMY;  Surgeon: Everitt Amber, MD;  Location: [facility];  Service: Gynecology;  Laterality: N/A;  . LYSIS OF ADHESION N/A 07/06/2015   Procedure:  LYSIS OF ADHESION RESECTION OF MESENTERIC MASS BOWEL RESECTION ;  Surgeon: Everitt Amber, MD;  Location: [facility];  Service: Gynecology;  Laterality: N/A;  . ROBOTIC ASSISTED TOTAL HYSTERECTOMY WITH BILATERAL SALPINGO OOPHERECTOMY Bilateral 07/28/2014   Procedure: ROBOTIC ASSISTED lysis of adhesions with biopsies,  converted to LAPAROTOMY, bilateral salpingoorphorectomy, omentectomy,appendectomy;  Surgeon: Janie Morning, MD;  Location: [facility];  Service: Gynecology;  Laterality: Bilateral;  . THYROIDECTOMY, PARTIAL     Family History  Problem Relation Age of Onset  . Hypertension Mother   . Hypertension Father   . Diabetes Father   . Cancer Sister 50       fibrosarcoma (back); currently 69  . Prostate cancer Maternal Uncle   . Colon cancer Paternal Aunt        Dx 83s; deceased 59s  . Prostate cancer Paternal Uncle        currently 94  . Cancer Paternal Uncle 65       "bone" ; unk. primary  . Stomach cancer Paternal Uncle    Social History   Socioeconomic History  . Marital status: Married    Spouse name: Not on file  . Number of children: Not on file  . Years of education: Not on file  . Highest education level: Not on file  Occupational History  . Not on file  Tobacco Use  . Smoking status: Never Smoker  . Smokeless tobacco: Never Used  Substance and Sexual Activity  . Alcohol use: No  . Drug use: No  . Sexual activity: Not on file  Other Topics Concern  . Not on file  Social History Narrative  . Not on file   Social Determinants of Health   Financial Resource Strain:   . Difficulty of Paying Living Expenses:   Food Insecurity:   . Worried About Charity fundraiser in the Last Year:   . Arboriculturist in the Last Year:   Transportation Needs:   . Film/video editor (Medical):   Marland Kitchen Lack of Transportation (Non-Medical):   Physical Activity:   . Days of Exercise per Week:   . Minutes of Exercise per Session:   Stress:   . Feeling of Stress :   Social Connections:   . Frequency of Communication with Friends and Family:   . Frequency of Social Gatherings with Friends and Family:   . Attends Religious Services:   . Active Member of Clubs or Organizations:   . Attends Archivist Meetings:   Marland Kitchen Marital Status:   Intimate Partner Violence:   . Fear of Current  or Ex-Partner:   .  Emotionally Abused:   Marland Kitchen Physically Abused:   . Sexually Abused:     Review of systems: Constitutional:  She has no weight gain or weight loss. She has no fever or chills. Eyes: No blurred vision Ears, Nose, Mouth, Throat: No dizziness, headaches or changes in hearing. No mouth sores. Cardiovascular: No chest pain, palpitations or edema. Respiratory:  No shortness of breath, wheezing or cough Gastrointestinal: She has normal bowel movements without diarrhea or constipation. She denies any nausea or vomiting. She denies blood in her stool or heart burn. Abdominal pain Genitourinary:  She denies pelvic pain, pelvic pressure or changes in her urinary function. She has no hematuria, dysuria, or incontinence. She has no irregular vaginal bleeding or vaginal discharge Musculoskeletal: Denies muscle weakness or joint pains.  Skin:  She has no skin changes, rashes or itching Neurological:  Denies dizziness or headaches. + bilateral foot neuropathy Psychiatric:  She denies depression or anxiety. Hematologic/Lymphatic:   No easy bruising or bleeding   Physical Exam: Deferred  I discussed the assessment and treatment plan with the patient. The patient was provided with an opportunity to ask questions and all were answered. The patient agreed with the plan and demonstrated an understanding of the instructions.   The patient was advised to call back or see an in-person evaluation if the symptoms worsen or if the condition fails to improve as anticipated.   I provided 10 minutes of non face-to-face telephone visit time during this encounter, and > 50% was spent counseling as documented under my assessment & plan.   Thereasa Solo, MD

## 2019-11-02 ENCOUNTER — Encounter: Payer: Self-pay | Admitting: Internal Medicine

## 2019-11-02 ENCOUNTER — Ambulatory Visit: Payer: Medicare HMO | Admitting: Podiatry

## 2019-11-03 ENCOUNTER — Ambulatory Visit: Payer: Medicare HMO | Attending: Internal Medicine | Admitting: Internal Medicine

## 2019-11-03 ENCOUNTER — Other Ambulatory Visit: Payer: Self-pay

## 2019-11-03 DIAGNOSIS — J989 Respiratory disorder, unspecified: Secondary | ICD-10-CM | POA: Diagnosis not present

## 2019-11-03 DIAGNOSIS — R509 Fever, unspecified: Secondary | ICD-10-CM | POA: Diagnosis not present

## 2019-11-03 DIAGNOSIS — J208 Acute bronchitis due to other specified organisms: Secondary | ICD-10-CM

## 2019-11-03 DIAGNOSIS — R918 Other nonspecific abnormal finding of lung field: Secondary | ICD-10-CM

## 2019-11-03 DIAGNOSIS — B9689 Other specified bacterial agents as the cause of diseases classified elsewhere: Secondary | ICD-10-CM | POA: Diagnosis not present

## 2019-11-03 MED ORDER — AZITHROMYCIN 250 MG PO TABS: ORAL_TABLET | ORAL | 0 refills | Status: DC

## 2019-11-03 MED ORDER — ALBUTEROL SULFATE HFA 108 (90 BASE) MCG/ACT IN AERS: 2.0000 | INHALATION_SPRAY | Freq: Four times a day (QID) | RESPIRATORY_TRACT | 0 refills | Status: AC | PRN

## 2019-11-03 MED ORDER — BENZONATATE 100 MG PO CAPS: 100.0000 mg | ORAL_CAPSULE | Freq: Three times a day (TID) | ORAL | 0 refills | Status: DC | PRN

## 2019-11-03 NOTE — Progress Notes (Signed)
Due to current restrictions/limitations of in-office visits due to the COVID-19 pandemic, this scheduled clinical appointment was converted to a telehealth visit Virtual Visit via Telephone Note  I connected with Sarah [name] on 11/03/19 at 11:32 a.m by telephone and verified that I am speaking with the correct person using two identifiers. I am in my office.  The patient is at home.  Only the patient and myself participated in this encounter.  I discussed the limitations, risks, security and privacy concerns of performing an evaluation and management service by telephone and the availability of in person appointments. I also discussed with the patient that there may be a patient responsible charge related to this service. The patient expressed understanding and agreed to proceed.   History of Present Illness: DM with peripheral neuropathy(more so due to chemo), HTN, HL, OSA, midline LBP,stage IIIc low-grade serous carcinoma of the ovaryS/p BSO, omentectomy, appendectomy on 07/28/14. S/p adjuvant chemotherapy with 6 cycles of paclitaxel and carboplatin completed on 12/08/14. S/p an ex lap, hernia repair and small bowel resection for a presumed recurrence (benign pathology).  Pt c/o what started as dry cough x 1.  Became productive of blood tinge sputum for a few days. Associated with a little SOB, dec energy and appetite.  No nasal or chest congestion.  No loss of taste or smell.  Fever of 102 four days ago; yesterday was 100 and this morning was 99.  Taking Tylenol 1-2 x a day No known sick contacts. Complete COVID-19 vaccine.  SOB and cough better.   As part of her work-up for hypercalcemia, she has had f/u labs with  oncologist [name].  Kappa light chain level slightly elevated but less than before.  She did not recommend bone marrow biopsy at this time.  It seems the overall thought is that she does not have multiple myeloma.  She also saw her GYN oncologist Dr. [name] and work-up revealed no  recurrence of ovarian CA.  PET scan did reveal a right lung nodule of 11 mm which was not hypermetabolic.  The radiologist did say that while this is reassuring, well differentiated or low-grade neoplasm cannot be poorly FDG avid and close continued follow-up is recommended.  She also had a new 14 mm ill-defined nodular opacity in the posterior left lower lobe which was new.  Etiology thought to be infectious or inflammatory.   Outpatient Encounter Medications as of 11/03/2019  Medication Sig Note  . amLODipine (NORVASC) 10 MG tablet Take 1 tablet (10 mg total) by mouth daily.   . Blood Glucose Monitoring Suppl (ACCU-CHEK AVIVA PLUS) w/Device KIT USE AS DIRECTED   . Blood Glucose Monitoring Suppl (ACCU-CHEK AVIVA) device Use as instructed   . colchicine 0.6 MG tablet 2 tabs PO x 1 dose then 1 tab PO daily for 2 days   . diclofenac sodium (VOLTAREN) 1 % GEL Apply 2 g topically 4 (four) times daily.   . dorzolamide-timolol (COSOPT) 22.3-6.8 MG/ML ophthalmic solution    . gabapentin (NEURONTIN) 300 MG capsule T6AKE 1 CAPSULE BY MOUTH EVERY MORNING AND 1 CAPSULE AT NOON, AND 2 CAPSULES AT BEDTIME   . glimepiride (AMARYL) 4 MG tablet Take 1 tablet (4 mg total) by mouth daily with breakfast.   . glucose blood (ACCU-CHEK AVIVA PLUS) test strip Use as instructed   . hydrALAZINE (APRESOLINE) 25 MG tablet Take 1.5 tablets (37.5 mg total) by mouth 3 (three) times daily.   . hydrochlorothiazide (HYDRODIURIL) 25 MG tablet Take 0.5 tablets (12.5 mg  total) by mouth daily.   . insulin glargine (LANTUS SOLOSTAR) 100 UNIT/ML Solostar Pen Inject 30 units daily. Increase by 2 units every 3 days until morning sugars are < 130. Max = 40 units.   . Insulin Pen Needle 29G X 12MM MISC Use as directed.   . Lancets (ACCU-CHEK MULTICLIX) lancets  07/24/2015: Received from: External Pharmacy  . Lancets (ACCU-CHEK SOFT TOUCH) lancets Use as instructed   . Lancets Misc. (ACCU-CHEK SOFTCLIX LANCET DEV) KIT Use as directed   .  latanoprost (XALATAN) 0.005 % ophthalmic solution Place 1 drop into both eyes at bedtime.   Marland Kitchen losartan (COZAAR) 50 MG tablet Take 1 tablet (50 mg total) by mouth daily.   . meloxicam (MOBIC) 15 MG tablet Take 1 tablet (15 mg total) by mouth daily.   . metformin (FORTAMET) 1000 MG (OSM) 24 hr tablet Take 1,000 mg by mouth 2 (two) times daily.   . metoprolol tartrate (LOPRESSOR) 25 MG tablet TAKE 1/2 TABLET BY MOUTH 2 TIMES A DAY   . rosuvastatin (CRESTOR) 20 MG tablet Take 1 tablet (20 mg total) by mouth daily. Stop Atorvastatin   . timolol (TIMOPTIC) 0.5 % ophthalmic solution Place 1 drop into both eyes daily.   . Travoprost, BAK Free, (TRAVATAN) 0.004 % SOLN ophthalmic solution 1 drop at bedtime.   . Vitamin D, Ergocalciferol, (DRISDOL) 1.25 MG (50000 UNIT) CAPS capsule TAKE 1 CAPSULE EVERY 7 DAYS   . [DISCONTINUED] blood glucose meter kit and supplies Dispense based on patient and insurance preference. Use up to four times daily as directed. (FOR ICD-9 250.00, 250.01).    No facility-administered encounter medications on file as of 11/03/2019.      Observations/Objective: Results for orders placed or performed during the hospital encounter of 10/26/19  Glucose, capillary  Result Value Ref Range   Glucose-Capillary 193 (H) 70 - 99 mg/dL    Assessment and Plan: 1. Acute bacterial bronchitis Differential diagnosis include bronchitis, COVID-19 or flu.   She has completed the COVID-19 vaccine series so this is less likely but still has to be considered.  We will do Covid testing.  Patient advised on self quarantine until results are back. We will placed her on Zithromax, Tessalon Perles and albuterol inhaler to cover for acute bacterial bronchitis or pneumonia. - azithromycin (ZITHROMAX Z-PAK) 250 MG tablet; Take 2 tabs Po today then 1 tab daily.  Dispense: 6 each; Refill: 0 - benzonatate (TESSALON PERLES) 100 MG capsule; Take 1 capsule (100 mg total) by mouth 3 (three) times daily as needed  for cough.  Dispense: 30 capsule; Refill: 0 - albuterol (VENTOLIN HFA) 108 (90 Base) MCG/ACT inhaler; Inhale 2 puffs into the lungs every 6 (six) hours as needed for wheezing or shortness of breath.  Dispense: 8 g; Refill: 0  2. Respiratory illness with fever See #1 above - Novel Coronavirus, NAA ([facility])  3. Lung nodule, multiple  - Ambulatory referral to Pulmonology  4. Hypercalcemia - Ambulatory referral to Pulmonology   Follow Up Instructions: PRN   I discussed the assessment and treatment plan with the patient. The patient was provided an opportunity to ask questions and all were answered. The patient agreed with the plan and demonstrated an understanding of the instructions.   The patient was advised to call back or seek an in-person evaluation if the symptoms worsen or if the condition fails to improve as anticipated.  I provided 16 minutes of non-face-to-face time during this encounter.   Karle Plumber, MD

## 2019-11-04 ENCOUNTER — Other Ambulatory Visit: Payer: Self-pay | Admitting: *Deleted

## 2019-11-04 ENCOUNTER — Other Ambulatory Visit: Payer: Self-pay | Admitting: Gynecologic Oncology

## 2019-11-04 LAB — SARS-COV-2, NAA 2 DAY TAT

## 2019-11-04 LAB — NOVEL CORONAVIRUS, NAA: SARS-CoV-2, NAA: NOT DETECTED

## 2019-11-04 NOTE — Progress Notes (Signed)
The proposed treatment discussed in cancer conference is for discussion purpose only and is not a binding recommendation. The patient was not physically examined nor present for their treatment options. Therefore, final treatment plans cannot be decided.  ?

## 2019-11-05 ENCOUNTER — Telehealth: Payer: Self-pay

## 2019-11-05 ENCOUNTER — Other Ambulatory Visit: Payer: Self-pay | Admitting: Gynecologic Oncology

## 2019-11-05 ENCOUNTER — Telehealth: Payer: Self-pay | Admitting: Internal Medicine

## 2019-11-05 DIAGNOSIS — R911 Solitary pulmonary nodule: Secondary | ICD-10-CM

## 2019-11-05 NOTE — Telephone Encounter (Signed)
Told Ms Dressel that a specialized PET scan  Called a PET Dotatate Scan  Needs to be performed and then a referral to CVTS. This scan will help determineif the nodule is a carcinoid tumor. Pt verbalized understanding. PET scan Dotatate needs prior quth per Lovena Le in nuclear med. [phone]. Will call to schedule once authorization obtained. Will schedule the CVTS appointment once date of PET is determined.

## 2019-11-05 NOTE — Progress Notes (Signed)
Pt discussed at pulm tumor board.  Recommendation for PET Dotatate scan and refer to thoracic surgery for possible carcinoid tumor.

## 2019-11-05 NOTE — Telephone Encounter (Signed)
-----   Message from Dorothyann Gibbs, NP sent at 11/05/2019 12:35 PM EDT ----- Regarding: FW: update thoracic cancer conference Hello.  Wanted to let you know what was recommended at pulmonary oncology tumor board. I have ordered the PET Dotatate and placed the referral to thoracic but I can remove these orders if you would like for them to be ordered under your name since you will be following her for this.  Thank you,  Melissa ----- Message ----- From: Valrie Hart, RN Sent: 11/04/2019   7:31 AM EDT To: Everitt Amber, MD, Dorothyann Gibbs, NP Subject: update thoracic cancer conference              Good morning Dr. Lianne Bushy Ms. Razzak was discussed at cancer conference today.  Treatment plan recommendation is for patient to get a PET Dotatate scan and refer to thoracic surgery.  Everyone thinks its a carcinoid. Let me know what I can do to help.  Thanks Hinton Dyer

## 2019-11-09 ENCOUNTER — Telehealth: Payer: Self-pay | Admitting: Oncology

## 2019-11-09 ENCOUNTER — Encounter: Payer: Medicare HMO | Admitting: Thoracic Surgery (Cardiothoracic Vascular Surgery)

## 2019-11-09 NOTE — Telephone Encounter (Signed)
Called Sarah [name] and let her know that we are not able to authorize or schedule the PET scan until her previous authorization expires on 11/25/19 per [facility].  Advised her that she can try calling [employer] and to give [place] a call back. She verbalized agreement.

## 2019-11-10 ENCOUNTER — Telehealth: Payer: Self-pay | Admitting: Internal Medicine

## 2019-11-10 NOTE — Telephone Encounter (Signed)
-----   Message from Dorothyann Gibbs, NP sent at 11/05/2019 12:35 PM EDT ----- Regarding: FW: update thoracic cancer conference Hello.  Wanted to let you know what was recommended at pulmonary oncology tumor board. I have ordered the PET Dotatate and placed the referral to thoracic but I can remove these orders if you would like for them to be ordered under your name since you will be following her for this.  Thank you,  Melissa ----- Message ----- From: Valrie Hart, RN Sent: 11/04/2019   7:31 AM EDT To: Everitt Amber, MD, Dorothyann Gibbs, NP Subject: update thoracic cancer conference              Good morning Dr. Lianne Bushy Ms. Klingel was discussed at cancer conference today.  Treatment plan recommendation is for patient to get a PET Dotatate scan and refer to thoracic surgery.  Everyone thinks its a carcinoid. Let me know what I can do to help.  Thanks Hinton Dyer

## 2019-11-11 ENCOUNTER — Telehealth: Payer: Self-pay | Admitting: Oncology

## 2019-11-11 NOTE — Telephone Encounter (Signed)
Notified Cindy at [facility] [phone]) that PET Dotatate can not be scheduled/authorized until 11/26/19 when previous PET authorization will expire and that will will call back once it is scheduled.

## 2019-11-11 NOTE — Telephone Encounter (Signed)
Left a message for Sarah [name] regarding PET scan.  Advised her that we will need to wait until 11/26/19 to authorize or schedule it.  This is due to her previous PET authorization not expiring until 11/25/19.

## 2019-11-17 ENCOUNTER — Ambulatory Visit: Payer: Medicare HMO | Admitting: Gynecologic Oncology

## 2019-11-22 ENCOUNTER — Ambulatory Visit: Payer: Medicare HMO | Attending: Internal Medicine | Admitting: Pharmacist

## 2019-11-22 ENCOUNTER — Encounter: Payer: Self-pay | Admitting: Pharmacist

## 2019-11-22 ENCOUNTER — Other Ambulatory Visit: Payer: Self-pay

## 2019-11-22 DIAGNOSIS — E1165 Type 2 diabetes mellitus with hyperglycemia: Secondary | ICD-10-CM

## 2019-11-22 DIAGNOSIS — E1142 Type 2 diabetes mellitus with diabetic polyneuropathy: Secondary | ICD-10-CM | POA: Diagnosis not present

## 2019-11-22 DIAGNOSIS — IMO0002 Reserved for concepts with insufficient information to code with codable children: Secondary | ICD-10-CM

## 2019-11-22 DIAGNOSIS — Z794 Long term (current) use of insulin: Secondary | ICD-10-CM | POA: Diagnosis not present

## 2019-11-22 NOTE — Progress Notes (Signed)
    S:    PCP: Dr. Wynetta Emery   No chief complaint on file.  Patient arrives in good spirits.  Presents for diabetes evaluation, education, and management Patient was referred and last seen by Primary Care Provider on 09/23/2019. Insulin dose was increased at that visit. I saw her on 10/21/2019 and gave instructions to titrate insulin.   Family/Social History:  FHx: DM, HTN Tobacco: never smoker  Alcohol: denies use    Insurance coverage/medication affordability: [employer]  Medication adherence reported.   Current diabetes medications include: metformin 1000 mg BID, glimepiride 4 mg daily, Lantus 40 units daily   Patient denies hypoglycemic events.  Patient reported dietary habits: - Reports trying to better with her diet   Patient-reported exercise habits:  - Walks daily; when she settles, she notices increased pain in her toes    Patient reports polydipsia, nocturia.  Patient reports neuropathy (nerve pain) in her toes but reports this is due to arthritis. Additionally, her chart shows neuropathy d/t chemo.  Patient denies visual changes. Patient denies self foot exams.     O:  POCT: 176 (post-prandial)  Lab Results  Component Value Date   HGBA1C 9.8 (A) 09/23/2019   There were no vitals filed for this visit.  Lipid Panel     Component Value Date/Time   CHOL 166 12/04/2018 0914   TRIG 222 (H) 12/04/2018 0914   HDL 42 12/04/2018 0914   CHOLHDL 4.0 12/04/2018 0914   CHOLHDL 5.1 (H) 08/14/2016 1729   VLDL 74 (H) 08/14/2016 1729   LDLCALC 80 12/04/2018 0914    Home fasting blood sugars: mostly 140s - 1800.  Home post-prandial/random levels: 160s - 250s. Occasional outlier >250.  Clinical Atherosclerotic Cardiovascular Disease (ASCVD): No  The 10-year ASCVD risk score Mikey Bussing DC Jr., et al., 2013) is: 8.7%   Values used to calculate the score:     Age: 54 years     Sex: Female     Is Non-Hispanic African American: Yes     Diabetic: Yes     Tobacco smoker: No      Systolic Blood Pressure: 656 mmHg     Is BP treated: Yes     HDL Cholesterol: 42 mg/dL     Total Cholesterol: 166 mg/dL    A/P: Diabetes longstanding currently uncontrolled. Patient is able to verbalize appropriate hypoglycemia management plan. Medication adherence: reports.   Home fasting blood sugars have improved. She is still having some post-prandial levels in the 200s. Will adjust basal insulin to achieve fasting levels. Explained to her that we may likely need to add prandial coverage when she returns in August if she her A1c hasn't improved.   -Continue Lantus 40 units daily. Instructions given to increase by 2 units every 3 days until FBG < 130 or max of 50 units.   -Continued other medications.  -Extensively discussed pathophysiology of diabetes, recommended lifestyle interventions, dietary effects on blood sugar control -Counseled on s/sx of and management of hypoglycemia -Next A1C anticipated 12/2019.   Written patient instructions provided.  Total time in face to face counseling 30 minutes.   Follow up w/ PCP in August.    Benard Halsted, PharmD, [place] [phone]

## 2019-11-30 ENCOUNTER — Telehealth: Payer: Self-pay | Admitting: Oncology

## 2019-11-30 ENCOUNTER — Encounter: Payer: Self-pay | Admitting: Internal Medicine

## 2019-11-30 NOTE — Telephone Encounter (Signed)
Called Sarah [name] and advised her of dotatate PET appointment on 12/08/19 at 3:00 (arrival at 2:30, NPO 4 hours prior).  She verbalized agreement.  Also left a message for Cinday at [facility] to see if [name] can be scheduled for a consult.

## 2019-12-08 ENCOUNTER — Other Ambulatory Visit: Payer: Self-pay

## 2019-12-08 ENCOUNTER — Ambulatory Visit (HOSPITAL_COMMUNITY)
Admission: RE | Admit: 2019-12-08 | Discharge: 2019-12-08 | Disposition: A | Discharge status: Home / Self Care | Payer: Medicare HMO | Source: Ambulatory Visit | Attending: Gynecologic Oncology | Admitting: Gynecologic Oncology

## 2019-12-08 DIAGNOSIS — C569 Malignant neoplasm of unspecified ovary: Secondary | ICD-10-CM | POA: Diagnosis not present

## 2019-12-08 DIAGNOSIS — R911 Solitary pulmonary nodule: Secondary | ICD-10-CM

## 2019-12-08 MED ORDER — GALLIUM GA 68 DOTATATE IV KIT
5.3000 | PACK | Freq: Once | INTRAVENOUS | Status: AC | PRN
  Administered 2019-12-08: 5.3 via INTRAVENOUS

## 2019-12-09 ENCOUNTER — Telehealth: Payer: Self-pay | Admitting: Oncology

## 2019-12-09 NOTE — Telephone Encounter (Signed)
Called Sarah [name] and notified her of the dotatate PET results and the recommendation to keep her appointment to see Dr. Skipper Cliche on 12/20/19. She verbalized understanding and agreement.

## 2019-12-16 ENCOUNTER — Encounter: Payer: Medicare HMO | Admitting: Thoracic Surgery (Cardiothoracic Vascular Surgery)

## 2019-12-20 ENCOUNTER — Institutional Professional Consult (permissible substitution): Payer: Medicare HMO | Admitting: Thoracic Surgery (Cardiothoracic Vascular Surgery)

## 2019-12-20 ENCOUNTER — Other Ambulatory Visit: Payer: Self-pay

## 2019-12-20 ENCOUNTER — Encounter: Payer: Self-pay | Admitting: Thoracic Surgery (Cardiothoracic Vascular Surgery)

## 2019-12-20 VITALS — BP 128/83 | HR 87 | Temp 97.0°F | Resp 18 | Ht 60.0 in | Wt 229.6 lb

## 2019-12-20 DIAGNOSIS — R911 Solitary pulmonary nodule: Secondary | ICD-10-CM

## 2019-12-20 NOTE — Progress Notes (Signed)
PCP is Ladell Pier, MD Referring Provider is Dorothyann Gibbs, NP  Chief Complaint  Patient presents with  . Lung Lesion    new patient consultation, PET 5/18, 7/1, Chest/ ABD/ Pelvis 5/5    HPI: Sarah [name] is in for consultation regarding a right lower lobe lung nodule.  Lucinda Spells is a 54 year old woman with a history of ovarian cancer, hypertension, hyperlipidemia, type II non-insulin-dependent diabetes, gout, and a thyroid nodule.  She was treated for stage IIIc low-grade serous ovarian carcinoma with BSO and omentectomy followed by adjuvant chemotherapy in 2016.  She is a lifelong non-smoker.  She recently saw Dr. [name] in follow-up.  She was noted to have hypercalcemia.  A CT of the chest abdomen pelvis was performed.  She was found to have an 11 mm smoothly marginated right lower lobe lung nodule.  This nodule was about 5 mm in 2016.  On PET CT there was no significant metabolic activity.  PET dotatate also showed no significant activity.  She says that she has occasional wheezing and coughing.  She does not have any history of asthma.  She has not had any recent weight loss or change in appetite.  No headaches or visual changes.  Zubrod Score: At the time of surgery this patient's most appropriate activity status/level should be described as: [x]     0    Normal activity, no symptoms []     1    Restricted in physical strenuous activity but ambulatory, able to do out light work []     2    Ambulatory and capable of self care, unable to do work activities, up and about >50 % of waking hours                              []     3    Only limited self care, in bed greater than 50% of waking hours []     4    Completely disabled, no self care, confined to bed or chair []     5    Moribund  Past Medical History:  Diagnosis Date  . Arthritis   . Family history of colon cancer   . Glaucoma 02/16  . Glucagonoma 07/28/14   Pt denies this but states she has glaucoma  . History  of chemotherapy   . Hypertension   . Imbalance   . Neuropathy    feet bilat  . Nocturia   . Peritoneal carcinomatosis ([place])    carcinoma of ovary   . Shortness of breath dyspnea    hx of 2013 - no problems currently   . Thyroid nodule    history of   . Wears glasses     Past Surgical History:  Procedure Laterality Date  . ABDOMINAL HYSTERECTOMY    . FOOT SURGERY  04/2018   [facility]  . INCISIONAL HERNIA REPAIR N/A 07/06/2015   Procedure: INCISIONAL HERNIA REPAIR ;  Surgeon: Rolm Bookbinder, MD;  Location: [facility];  Service: General;  Laterality: N/A;  . INSERTION OF MESH N/A 07/06/2015   Procedure: WITH INSERTION OF PHASIX ST MESH;  Surgeon: Rolm Bookbinder, MD;  Location: [facility];  Service: General;  Laterality: N/A;  . LAPAROTOMY N/A 07/06/2015   Procedure: EXPLORATORY LAPAROTOMY;  Surgeon: Everitt Amber, MD;  Location: [facility];  Service: Gynecology;  Laterality: N/A;  . LYSIS OF ADHESION N/A 07/06/2015   Procedure:  LYSIS OF ADHESION RESECTION OF  MESENTERIC MASS BOWEL RESECTION ;  Surgeon: Everitt Amber, MD;  Location: [facility];  Service: Gynecology;  Laterality: N/A;  . ROBOTIC ASSISTED TOTAL HYSTERECTOMY WITH BILATERAL SALPINGO OOPHERECTOMY Bilateral 07/28/2014   Procedure: ROBOTIC ASSISTED lysis of adhesions with biopsies, converted to LAPAROTOMY, bilateral salpingoorphorectomy, omentectomy,appendectomy;  Surgeon: Janie Morning, MD;  Location: [facility];  Service: Gynecology;  Laterality: Bilateral;  . THYROIDECTOMY, PARTIAL      Family History  Problem Relation Age of Onset  . Hypertension Mother   . Hypertension Father   . Diabetes Father   . Cancer Sister 50       fibrosarcoma (back); currently 60  . Prostate cancer Maternal Uncle   . Colon cancer Paternal Aunt        Dx 5s; deceased 76s  . Prostate cancer Paternal Uncle        currently 25  . Cancer Paternal Uncle 72       "bone" ; unk. primary  . Stomach cancer Paternal Uncle     Social History Social History    Tobacco Use  . Smoking status: Never Smoker  . Smokeless tobacco: Never Used  Vaping Use  . Vaping Use: Never used  Substance Use Topics  . Alcohol use: No  . Drug use: No    Current Outpatient Medications  Medication Sig Dispense Refill  . albuterol (VENTOLIN HFA) 108 (90 Base) MCG/ACT inhaler Inhale 2 puffs into the lungs every 6 (six) hours as needed for wheezing or shortness of breath. 8 g 0  . amLODipine (NORVASC) 10 MG tablet Take 1 tablet (10 mg total) by mouth daily. 90 tablet 3  . Blood Glucose Monitoring Suppl (ACCU-CHEK AVIVA PLUS) w/Device KIT USE AS DIRECTED 1 kit 0  . Blood Glucose Monitoring Suppl (ACCU-CHEK AVIVA) device Use as instructed 1 each 0  . diclofenac sodium (VOLTAREN) 1 % GEL Apply 2 g topically 4 (four) times daily. 100 g 3  . dorzolamide-timolol (COSOPT) 22.3-6.8 MG/ML ophthalmic solution     . gabapentin (NEURONTIN) 300 MG capsule T6AKE 1 CAPSULE BY MOUTH EVERY MORNING AND 1 CAPSULE AT NOON, AND 2 CAPSULES AT BEDTIME 360 capsule 3  . glimepiride (AMARYL) 4 MG tablet Take 1 tablet (4 mg total) by mouth daily with breakfast. 90 tablet 3  . glucose blood (ACCU-CHEK AVIVA PLUS) test strip Use as instructed 100 each 12  . hydrALAZINE (APRESOLINE) 25 MG tablet Take 1.5 tablets (37.5 mg total) by mouth 3 (three) times daily. 405 tablet 3  . hydrochlorothiazide (HYDRODIURIL) 25 MG tablet Take 0.5 tablets (12.5 mg total) by mouth daily. 90 tablet 3  . insulin glargine (LANTUS SOLOSTAR) 100 UNIT/ML Solostar Pen Inject 30 units daily. Increase by 2 units every 3 days until morning sugars are < 130. Max = 40 units. 15 mL 2  . Insulin Pen Needle 29G X 12MM MISC Use as directed. 100 each 6  . Lancets (ACCU-CHEK MULTICLIX) lancets   0  . Lancets (ACCU-CHEK SOFT TOUCH) lancets Use as instructed 100 each 12  . Lancets Misc. (ACCU-CHEK SOFTCLIX LANCET DEV) KIT Use as directed 1 kit 0  . latanoprost (XALATAN) 0.005 % ophthalmic solution Place 1 drop into both eyes at  bedtime.    Marland Kitchen losartan (COZAAR) 50 MG tablet Take 1 tablet (50 mg total) by mouth daily. 90 tablet 3  . meloxicam (MOBIC) 15 MG tablet Take 1 tablet (15 mg total) by mouth daily. 30 tablet 3  . metformin (FORTAMET) 1000 MG (OSM) 24 hr tablet Take  1,000 mg by mouth 2 (two) times daily.    . metoprolol tartrate (LOPRESSOR) 25 MG tablet TAKE 1/2 TABLET BY MOUTH 2 TIMES A DAY 90 tablet 3  . rosuvastatin (CRESTOR) 20 MG tablet Take 1 tablet (20 mg total) by mouth daily. Stop Atorvastatin 90 tablet 3  . timolol (TIMOPTIC) 0.5 % ophthalmic solution Place 1 drop into both eyes daily.    . Travoprost, BAK Free, (TRAVATAN) 0.004 % SOLN ophthalmic solution 1 drop at bedtime.    . Vitamin D, Ergocalciferol, (DRISDOL) 1.25 MG (50000 UNIT) CAPS capsule TAKE 1 CAPSULE EVERY 7 DAYS 12 capsule 0   No current facility-administered medications for this visit.    Allergies  Allergen Reactions  . Emend [Aprepitant] Other (See Comments)    Urticaria   . Lisinopril Cough    Review of Systems  Constitutional: Negative for activity change and unexpected weight change.  HENT: Negative for trouble swallowing and voice change.   Eyes: Negative for visual disturbance.  Respiratory: Positive for cough (Occasional) and wheezing (Occasional). Negative for shortness of breath.   Cardiovascular: Negative for chest pain, palpitations and leg swelling.  Gastrointestinal: Positive for abdominal distention.       Ventral hernia  Genitourinary: Negative for difficulty urinating and dysuria.  Musculoskeletal: Negative for arthralgias and myalgias.  Neurological: Negative for seizures, syncope and weakness.  Hematological: Negative for adenopathy. Does not bruise/bleed easily.  All other systems reviewed and are negative.   BP 128/83 (BP Location: Left Arm, Patient Position: Sitting, Cuff Size: Large)   Pulse 87   Temp (!) 97 F (36.1 C)   Resp 18   Ht 5' (1.524 m)   Wt 229 lb 9.6 oz (104.1 kg)   SpO2 94% Comment:  RA  BMI 44.84 kg/m  Physical Exam Vitals reviewed.  Constitutional:      General: She is not in acute distress.    Appearance: Normal appearance.  HENT:     Head: Normocephalic and atraumatic.  Eyes:     General: No scleral icterus.    Extraocular Movements: Extraocular movements intact.  Cardiovascular:     Rate and Rhythm: Normal rate and regular rhythm.     Heart sounds: Normal heart sounds. No murmur heard.  No friction rub. No gallop.   Pulmonary:     Effort: Pulmonary effort is normal. No respiratory distress.     Breath sounds: Normal breath sounds. No wheezing or rales.  Abdominal:     Palpations: Abdomen is soft.     Tenderness: There is no abdominal tenderness.     Hernia: A hernia is present.  Musculoskeletal:        General: No swelling.     Cervical back: Neck supple.  Lymphadenopathy:     Cervical: No cervical adenopathy.  Skin:    General: Skin is warm and dry.  Neurological:     General: No focal deficit present.     Mental Status: She is alert and oriented to person, place, and time.     Cranial Nerves: No cranial nerve deficit.     Motor: No weakness.     Gait: Gait normal.    Diagnostic Tests: CT CHEST, ABDOMEN, AND PELVIS WITH CONTRAST  TECHNIQUE: Multidetector CT imaging of the chest, abdomen and pelvis was performed following the standard protocol during bolus administration of intravenous contrast.  CONTRAST:  118m OMNIPAQUE IOHEXOL 300 MG/ML  SOLN  COMPARISON:  01/12/2015 chest CT. 11/26/2018 abdomen/pelvis CT.  FINDINGS: CT CHEST FINDINGS  Cardiovascular:  The heart size is normal. No substantial pericardial effusion. Coronary artery calcification is evident. Atherosclerotic calcification is noted in the wall of the thoracic aorta.  Mediastinum/Nodes: 1.7 cm right thyroid nodule. No mediastinal lymphadenopathy. There is no hilar lymphadenopathy. The esophagus has normal imaging features. There is no axillary  lymphadenopathy.  Lungs/Pleura: 11 mm central right lower lobe pulmonary nodule (image 77/6) has increased from 4 mm on 01/12/2015. This may possibly be of bronchial origin. Tiny lingular nodule on 89/6 is stable. Scattered subsegmental atelectasis.  Musculoskeletal: No worrisome lytic or sclerotic osseous abnormality.  CT ABDOMEN PELVIS FINDINGS  Hepatobiliary: The liver shows diffusely decreased attenuation suggesting fat deposition. No suspicious focal abnormality within the liver parenchyma. There is no evidence for gallstones, gallbladder wall thickening, or pericholecystic fluid. No intrahepatic or extrahepatic biliary dilation.  Pancreas: No focal mass lesion. No dilatation of the main duct. No intraparenchymal cyst. No peripancreatic edema.  Spleen: No splenomegaly. No focal mass lesion.  Adrenals/Urinary Tract: No adrenal nodule or mass. Right kidney unremarkable. Tiny interpolar left renal cyst is similar to prior. No evidence for hydroureter. The urinary bladder appears normal for the degree of distention.  Stomach/Bowel: Stomach is unremarkable. No gastric wall thickening. No evidence of outlet obstruction. No small bowel wall thickening. No small bowel dilatation. The terminal ileum is normal. The appendix is not visualized, but there is no edema or inflammation in the region of the cecum. No gross colonic mass. No colonic wall thickening.  Vascular/Lymphatic: There is abdominal aortic atherosclerosis without aneurysm. 13 mm portal caval node on 58/2 is similar to 11/26/2018. No retroperitoneal lymphadenopathy. No pelvic sidewall lymphadenopathy.  Reproductive: The uterus is surgically absent. There is no adnexal mass.  Other: No intraperitoneal free fluid.  Musculoskeletal: Supraumbilical ventral hernia again noted containing small bowel loops without complicating features. No worrisome lytic or sclerotic osseous  abnormality.  IMPRESSION: 1. 11 mm central right lower lobe pulmonary nodule has increased from 4 mm on 01/12/2015. While somewhat unusual for metastatic ovarian cancer, primary bronchogenic neoplasm would also be a consideration. This may possibly be of bronchial origin (carcinoid). PET-CT recommended to further evaluate. 2. No evidence for metastatic disease in the abdomen or pelvis. Prominent portal caval lymph node is unchanged since 11/26/2018. 3. Stable appearance of the supraumbilical ventral hernia containing small bowel loops without complicating features. 4. Hepatic steatosis. 5. 1.7 cm right thyroid nodule. Thyroid ultrasound recommended. (Ref: J Am Coll Radiol. 2015 Feb;12(2): 143-50). 6. Aortic Atherosclerosis (ICD10-I70.0).   Electronically Signed   By: Misty Stanley M.D.   On: 10/13/2019 11:12  PET/CT 10/26/2019 IMPRESSION: 1. 11 mm central right lower lobe pulmonary nodule shows no hypermetabolism. While this is reassuring, well differentiated or low-grade neoplasm can be poorly FDG avid and close continued follow-up recommended. 2. Interval development of a 14 mm ill-defined nodular opacity in the posterior left lower lobe over the 9 day interval since prior CT. This shows low level FDG uptake and is almost assuredly atelectatic or infectious/inflammatory etiology. 3. Stable 1.7 cm right thyroid nodule without hypermetabolic FDG uptake on PET imaging. This has been documented on prior studies including thyroid ultrasound of 10/22/2019 (ref: J Am Coll Radiol. 2015 Feb;12(2): 143-50). 4. No findings on today's study to suggest hypermetabolic metastatic disease in the chest, abdomen, or pelvis. 5.  Aortic Atherosclerois (ICD10-170.0) 6. Hepatic steatosis.   Electronically Signed   By: Misty Stanley M.D.   On: 10/26/2019 13:44  PET dotatate 12/08/2019 IMPRESSION: 1. Very low radiotracer activity associated  with the RIGHT lobe pulmonary nodule is  nonspecific. Findings would not be consistent with metastatic well-differentiated carcinoid tumor. Primary bronchial carcinoid can have low somatostatin receptor activity. Overall favor benign/indolent lesion. 2. No evidence of well differentiated neuroendocrine tumor within the abdomen pelvis.   Electronically Signed   By: Suzy Bouchard M.D.   On: 12/09/2019 10:40 I personally reviewed the CT, PET/CT, and PET dotatate scans and concur with the findings noted above.  Impression: Sarah [name] is a 54 year old woman with a history of ovarian cancer, hypertension, hyperlipidemia, type II non-insulin-dependent diabetes, gout, and a thyroid nodule.  She recently had a work-up for hypercalcemia.  She was found to have a 1.1 cm round smoothly marginated right lower lobe lung nodule.  Looking back at her CT from 2016 nodules about 5 mm at that time.  Differential diagnosis includes primary bronchogenic carcinoma, carcinoid tumor, bronchial adenoma, hamartoma, and metastatic disease.  Also could be an enlarged lymph node but I would expect some activity on PET if that was the case.  This is no features on PET/CT to suggest malignancy.  This appears to be relatively low-grade nodule.  I did not light up on the PET dotatate scan.  It could still be a carcinoid but if so is a very low grade one.  Options include bronchoscopy for biopsy, proceeding directly to surgical resection, or continued radiographic follow-up.  This is a relatively low-grade lesion and could be followed radiographically but it has grown although relatively slowly over the past 5 years.  Unfortunately the nodule is a relatively central lesion close to the basilar segmental airways and pulmonary artery, and might require a basilar segmentectomy or even lobectomy to get a margin on the lesion.  I would at least like to attempt to biopsy it before progressing to surgery.  Reviewing the CT it does appear there may be an endobronchial  component.  If so biopsies could give some more definitive diagnosis and help guide our decision making.  I think the first step would be to proceed with bronchoscopy and possible endobronchial ultrasound to further evaluate the lesion.  I informed her of the general nature of the proposed procedure of bronchoscopy endobronchial ultrasound.  She understands to be done in the operating room under general anesthesia on an outpatient basis.  She understands it is endoscopic and there are no incisions.  I informed her the indications, risk, benefits, and alternatives.  She understands the risk include but not limited to MI, DVT, PE, bleeding, pneumothorax, as well as possibility of other unstable complications.  The biggest risk is that we will not get a definitive answer and she understands there is no guarantee of that.  Plan: Bronchoscopy with endobronchial ultrasound.  We will call Ms. Nixon to schedule.  She would like to do it in early August.  I spent 30 minutes in review of images, records, and in consultation with Mrs. Noy today. Melrose Nakayama, MD [facility] [phone]

## 2019-12-20 NOTE — H&P (View-Only) (Signed)
PCP is Ladell Pier, MD Referring Provider is Dorothyann Gibbs, NP  Chief Complaint  Patient presents with  . Lung Lesion    new patient consultation, PET 5/18, 7/1, Chest/ ABD/ Pelvis 5/5    HPI: Sarah [name] is in for consultation regarding a right lower lobe lung nodule.  Sarah [name] is a 54 year old woman with a history of ovarian cancer, hypertension, hyperlipidemia, type II non-insulin-dependent diabetes, gout, and a thyroid nodule.  She was treated for stage IIIc low-grade serous ovarian carcinoma with BSO and omentectomy followed by adjuvant chemotherapy in 2016.  She is a lifelong non-smoker.  She recently saw Dr. [name] in follow-up.  She was noted to have hypercalcemia.  A CT of the chest abdomen pelvis was performed.  She was found to have an 11 mm smoothly marginated right lower lobe lung nodule.  This nodule was about 5 mm in 2016.  On PET CT there was no significant metabolic activity.  PET dotatate also showed no significant activity.  She says that she has occasional wheezing and coughing.  She does not have any history of asthma.  She has not had any recent weight loss or change in appetite.  No headaches or visual changes.  Zubrod Score: At the time of surgery this patient's most appropriate activity status/level should be described as: [x]     0    Normal activity, no symptoms []     1    Restricted in physical strenuous activity but ambulatory, able to do out light work []     2    Ambulatory and capable of self care, unable to do work activities, up and about >50 % of waking hours                              []     3    Only limited self care, in bed greater than 50% of waking hours []     4    Completely disabled, no self care, confined to bed or chair []     5    Moribund  Past Medical History:  Diagnosis Date  . Arthritis   . Family history of colon cancer   . Glaucoma 02/16  . Glucagonoma 07/28/14   Pt denies this but states she has glaucoma  . History  of chemotherapy   . Hypertension   . Imbalance   . Neuropathy    feet bilat  . Nocturia   . Peritoneal carcinomatosis ([place])    carcinoma of ovary   . Shortness of breath dyspnea    hx of 2013 - no problems currently   . Thyroid nodule    history of   . Wears glasses     Past Surgical History:  Procedure Laterality Date  . ABDOMINAL HYSTERECTOMY    . FOOT SURGERY  04/2018   [facility]  . INCISIONAL HERNIA REPAIR N/A 07/06/2015   Procedure: INCISIONAL HERNIA REPAIR ;  Surgeon: Rolm Bookbinder, MD;  Location: [facility];  Service: General;  Laterality: N/A;  . INSERTION OF MESH N/A 07/06/2015   Procedure: WITH INSERTION OF PHASIX ST MESH;  Surgeon: Rolm Bookbinder, MD;  Location: [facility];  Service: General;  Laterality: N/A;  . LAPAROTOMY N/A 07/06/2015   Procedure: EXPLORATORY LAPAROTOMY;  Surgeon: Everitt Amber, MD;  Location: [facility];  Service: Gynecology;  Laterality: N/A;  . LYSIS OF ADHESION N/A 07/06/2015   Procedure:  LYSIS OF ADHESION RESECTION OF  MESENTERIC MASS BOWEL RESECTION ;  Surgeon: Everitt Amber, MD;  Location: [facility];  Service: Gynecology;  Laterality: N/A;  . ROBOTIC ASSISTED TOTAL HYSTERECTOMY WITH BILATERAL SALPINGO OOPHERECTOMY Bilateral 07/28/2014   Procedure: ROBOTIC ASSISTED lysis of adhesions with biopsies, converted to LAPAROTOMY, bilateral salpingoorphorectomy, omentectomy,appendectomy;  Surgeon: Janie Morning, MD;  Location: [facility];  Service: Gynecology;  Laterality: Bilateral;  . THYROIDECTOMY, PARTIAL      Family History  Problem Relation Age of Onset  . Hypertension Mother   . Hypertension Father   . Diabetes Father   . Cancer Sister 50       fibrosarcoma (back); currently 79  . Prostate cancer Maternal Uncle   . Colon cancer Paternal Aunt        Dx 57s; deceased 32s  . Prostate cancer Paternal Uncle        currently 57  . Cancer Paternal Uncle 36       "bone" ; unk. primary  . Stomach cancer Paternal Uncle     Social History Social History    Tobacco Use  . Smoking status: Never Smoker  . Smokeless tobacco: Never Used  Vaping Use  . Vaping Use: Never used  Substance Use Topics  . Alcohol use: No  . Drug use: No    Current Outpatient Medications  Medication Sig Dispense Refill  . albuterol (VENTOLIN HFA) 108 (90 Base) MCG/ACT inhaler Inhale 2 puffs into the lungs every 6 (six) hours as needed for wheezing or shortness of breath. 8 g 0  . amLODipine (NORVASC) 10 MG tablet Take 1 tablet (10 mg total) by mouth daily. 90 tablet 3  . Blood Glucose Monitoring Suppl (ACCU-CHEK AVIVA PLUS) w/Device KIT USE AS DIRECTED 1 kit 0  . Blood Glucose Monitoring Suppl (ACCU-CHEK AVIVA) device Use as instructed 1 each 0  . diclofenac sodium (VOLTAREN) 1 % GEL Apply 2 g topically 4 (four) times daily. 100 g 3  . dorzolamide-timolol (COSOPT) 22.3-6.8 MG/ML ophthalmic solution     . gabapentin (NEURONTIN) 300 MG capsule T6AKE 1 CAPSULE BY MOUTH EVERY MORNING AND 1 CAPSULE AT NOON, AND 2 CAPSULES AT BEDTIME 360 capsule 3  . glimepiride (AMARYL) 4 MG tablet Take 1 tablet (4 mg total) by mouth daily with breakfast. 90 tablet 3  . glucose blood (ACCU-CHEK AVIVA PLUS) test strip Use as instructed 100 each 12  . hydrALAZINE (APRESOLINE) 25 MG tablet Take 1.5 tablets (37.5 mg total) by mouth 3 (three) times daily. 405 tablet 3  . hydrochlorothiazide (HYDRODIURIL) 25 MG tablet Take 0.5 tablets (12.5 mg total) by mouth daily. 90 tablet 3  . insulin glargine (LANTUS SOLOSTAR) 100 UNIT/ML Solostar Pen Inject 30 units daily. Increase by 2 units every 3 days until morning sugars are < 130. Max = 40 units. 15 mL 2  . Insulin Pen Needle 29G X 12MM MISC Use as directed. 100 each 6  . Lancets (ACCU-CHEK MULTICLIX) lancets   0  . Lancets (ACCU-CHEK SOFT TOUCH) lancets Use as instructed 100 each 12  . Lancets Misc. (ACCU-CHEK SOFTCLIX LANCET DEV) KIT Use as directed 1 kit 0  . latanoprost (XALATAN) 0.005 % ophthalmic solution Place 1 drop into both eyes at  bedtime.    Marland Kitchen losartan (COZAAR) 50 MG tablet Take 1 tablet (50 mg total) by mouth daily. 90 tablet 3  . meloxicam (MOBIC) 15 MG tablet Take 1 tablet (15 mg total) by mouth daily. 30 tablet 3  . metformin (FORTAMET) 1000 MG (OSM) 24 hr tablet Take  1,000 mg by mouth 2 (two) times daily.    . metoprolol tartrate (LOPRESSOR) 25 MG tablet TAKE 1/2 TABLET BY MOUTH 2 TIMES A DAY 90 tablet 3  . rosuvastatin (CRESTOR) 20 MG tablet Take 1 tablet (20 mg total) by mouth daily. Stop Atorvastatin 90 tablet 3  . timolol (TIMOPTIC) 0.5 % ophthalmic solution Place 1 drop into both eyes daily.    . Travoprost, BAK Free, (TRAVATAN) 0.004 % SOLN ophthalmic solution 1 drop at bedtime.    . Vitamin D, Ergocalciferol, (DRISDOL) 1.25 MG (50000 UNIT) CAPS capsule TAKE 1 CAPSULE EVERY 7 DAYS 12 capsule 0   No current facility-administered medications for this visit.    Allergies  Allergen Reactions  . Emend [Aprepitant] Other (See Comments)    Urticaria   . Lisinopril Cough    Review of Systems  Constitutional: Negative for activity change and unexpected weight change.  HENT: Negative for trouble swallowing and voice change.   Eyes: Negative for visual disturbance.  Respiratory: Positive for cough (Occasional) and wheezing (Occasional). Negative for shortness of breath.   Cardiovascular: Negative for chest pain, palpitations and leg swelling.  Gastrointestinal: Positive for abdominal distention.       Ventral hernia  Genitourinary: Negative for difficulty urinating and dysuria.  Musculoskeletal: Negative for arthralgias and myalgias.  Neurological: Negative for seizures, syncope and weakness.  Hematological: Negative for adenopathy. Does not bruise/bleed easily.  All other systems reviewed and are negative.   BP 128/83 (BP Location: Left Arm, Patient Position: Sitting, Cuff Size: Large)   Pulse 87   Temp (!) 97 F (36.1 C)   Resp 18   Ht 5' (1.524 m)   Wt 229 lb 9.6 oz (104.1 kg)   SpO2 94% Comment:  RA  BMI 44.84 kg/m  Physical Exam Vitals reviewed.  Constitutional:      General: She is not in acute distress.    Appearance: Normal appearance.  HENT:     Head: Normocephalic and atraumatic.  Eyes:     General: No scleral icterus.    Extraocular Movements: Extraocular movements intact.  Cardiovascular:     Rate and Rhythm: Normal rate and regular rhythm.     Heart sounds: Normal heart sounds. No murmur heard.  No friction rub. No gallop.   Pulmonary:     Effort: Pulmonary effort is normal. No respiratory distress.     Breath sounds: Normal breath sounds. No wheezing or rales.  Abdominal:     Palpations: Abdomen is soft.     Tenderness: There is no abdominal tenderness.     Hernia: A hernia is present.  Musculoskeletal:        General: No swelling.     Cervical back: Neck supple.  Lymphadenopathy:     Cervical: No cervical adenopathy.  Skin:    General: Skin is warm and dry.  Neurological:     General: No focal deficit present.     Mental Status: She is alert and oriented to person, place, and time.     Cranial Nerves: No cranial nerve deficit.     Motor: No weakness.     Gait: Gait normal.    Diagnostic Tests: CT CHEST, ABDOMEN, AND PELVIS WITH CONTRAST  TECHNIQUE: Multidetector CT imaging of the chest, abdomen and pelvis was performed following the standard protocol during bolus administration of intravenous contrast.  CONTRAST:  161m OMNIPAQUE IOHEXOL 300 MG/ML  SOLN  COMPARISON:  01/12/2015 chest CT. 11/26/2018 abdomen/pelvis CT.  FINDINGS: CT CHEST FINDINGS  Cardiovascular:  The heart size is normal. No substantial pericardial effusion. Coronary artery calcification is evident. Atherosclerotic calcification is noted in the wall of the thoracic aorta.  Mediastinum/Nodes: 1.7 cm right thyroid nodule. No mediastinal lymphadenopathy. There is no hilar lymphadenopathy. The esophagus has normal imaging features. There is no axillary  lymphadenopathy.  Lungs/Pleura: 11 mm central right lower lobe pulmonary nodule (image 77/6) has increased from 4 mm on 01/12/2015. This may possibly be of bronchial origin. Tiny lingular nodule on 89/6 is stable. Scattered subsegmental atelectasis.  Musculoskeletal: No worrisome lytic or sclerotic osseous abnormality.  CT ABDOMEN PELVIS FINDINGS  Hepatobiliary: The liver shows diffusely decreased attenuation suggesting fat deposition. No suspicious focal abnormality within the liver parenchyma. There is no evidence for gallstones, gallbladder wall thickening, or pericholecystic fluid. No intrahepatic or extrahepatic biliary dilation.  Pancreas: No focal mass lesion. No dilatation of the main duct. No intraparenchymal cyst. No peripancreatic edema.  Spleen: No splenomegaly. No focal mass lesion.  Adrenals/Urinary Tract: No adrenal nodule or mass. Right kidney unremarkable. Tiny interpolar left renal cyst is similar to prior. No evidence for hydroureter. The urinary bladder appears normal for the degree of distention.  Stomach/Bowel: Stomach is unremarkable. No gastric wall thickening. No evidence of outlet obstruction. No small bowel wall thickening. No small bowel dilatation. The terminal ileum is normal. The appendix is not visualized, but there is no edema or inflammation in the region of the cecum. No gross colonic mass. No colonic wall thickening.  Vascular/Lymphatic: There is abdominal aortic atherosclerosis without aneurysm. 13 mm portal caval node on 58/2 is similar to 11/26/2018. No retroperitoneal lymphadenopathy. No pelvic sidewall lymphadenopathy.  Reproductive: The uterus is surgically absent. There is no adnexal mass.  Other: No intraperitoneal free fluid.  Musculoskeletal: Supraumbilical ventral hernia again noted containing small bowel loops without complicating features. No worrisome lytic or sclerotic osseous  abnormality.  IMPRESSION: 1. 11 mm central right lower lobe pulmonary nodule has increased from 4 mm on 01/12/2015. While somewhat unusual for metastatic ovarian cancer, primary bronchogenic neoplasm would also be a consideration. This may possibly be of bronchial origin (carcinoid). PET-CT recommended to further evaluate. 2. No evidence for metastatic disease in the abdomen or pelvis. Prominent portal caval lymph node is unchanged since 11/26/2018. 3. Stable appearance of the supraumbilical ventral hernia containing small bowel loops without complicating features. 4. Hepatic steatosis. 5. 1.7 cm right thyroid nodule. Thyroid ultrasound recommended. (Ref: J Am Coll Radiol. 2015 Feb;12(2): 143-50). 6. Aortic Atherosclerosis (ICD10-I70.0).   Electronically Signed   By: Misty Stanley M.D.   On: 10/13/2019 11:12  PET/CT 10/26/2019 IMPRESSION: 1. 11 mm central right lower lobe pulmonary nodule shows no hypermetabolism. While this is reassuring, well differentiated or low-grade neoplasm can be poorly FDG avid and close continued follow-up recommended. 2. Interval development of a 14 mm ill-defined nodular opacity in the posterior left lower lobe over the 9 day interval since prior CT. This shows low level FDG uptake and is almost assuredly atelectatic or infectious/inflammatory etiology. 3. Stable 1.7 cm right thyroid nodule without hypermetabolic FDG uptake on PET imaging. This has been documented on prior studies including thyroid ultrasound of 10/22/2019 (ref: J Am Coll Radiol. 2015 Feb;12(2): 143-50). 4. No findings on today's study to suggest hypermetabolic metastatic disease in the chest, abdomen, or pelvis. 5.  Aortic Atherosclerois (ICD10-170.0) 6. Hepatic steatosis.   Electronically Signed   By: Misty Stanley M.D.   On: 10/26/2019 13:44  PET dotatate 12/08/2019 IMPRESSION: 1. Very low radiotracer activity associated  with the RIGHT lobe pulmonary nodule is  nonspecific. Findings would not be consistent with metastatic well-differentiated carcinoid tumor. Primary bronchial carcinoid can have low somatostatin receptor activity. Overall favor benign/indolent lesion. 2. No evidence of well differentiated neuroendocrine tumor within the abdomen pelvis.   Electronically Signed   By: Suzy Bouchard M.D.   On: 12/09/2019 10:40 I personally reviewed the CT, PET/CT, and PET dotatate scans and concur with the findings noted above.  Impression: Sarah [name] is a 54 year old woman with a history of ovarian cancer, hypertension, hyperlipidemia, type II non-insulin-dependent diabetes, gout, and a thyroid nodule.  She recently had a work-up for hypercalcemia.  She was found to have a 1.1 cm round smoothly marginated right lower lobe lung nodule.  Looking back at her CT from 2016 nodules about 5 mm at that time.  Differential diagnosis includes primary bronchogenic carcinoma, carcinoid tumor, bronchial adenoma, hamartoma, and metastatic disease.  Also could be an enlarged lymph node but I would expect some activity on PET if that was the case.  This is no features on PET/CT to suggest malignancy.  This appears to be relatively low-grade nodule.  I did not light up on the PET dotatate scan.  It could still be a carcinoid but if so is a very low grade one.  Options include bronchoscopy for biopsy, proceeding directly to surgical resection, or continued radiographic follow-up.  This is a relatively low-grade lesion and could be followed radiographically but it has grown although relatively slowly over the past 5 years.  Unfortunately the nodule is a relatively central lesion close to the basilar segmental airways and pulmonary artery, and might require a basilar segmentectomy or even lobectomy to get a margin on the lesion.  I would at least like to attempt to biopsy it before progressing to surgery.  Reviewing the CT it does appear there may be an endobronchial  component.  If so biopsies could give some more definitive diagnosis and help guide our decision making.  I think the first step would be to proceed with bronchoscopy and possible endobronchial ultrasound to further evaluate the lesion.  I informed her of the general nature of the proposed procedure of bronchoscopy endobronchial ultrasound.  She understands to be done in the operating room under general anesthesia on an outpatient basis.  She understands it is endoscopic and there are no incisions.  I informed her the indications, risk, benefits, and alternatives.  She understands the risk include but not limited to MI, DVT, PE, bleeding, pneumothorax, as well as possibility of other unstable complications.  The biggest risk is that we will not get a definitive answer and she understands there is no guarantee of that.  Plan: Bronchoscopy with endobronchial ultrasound.  We will call Ms. Maneri to schedule.  She would like to do it in early August.  I spent 30 minutes in review of images, records, and in consultation with Mrs. Heidel today. Melrose Nakayama, MD [facility] [phone]

## 2019-12-21 ENCOUNTER — Other Ambulatory Visit: Payer: Self-pay | Admitting: *Deleted

## 2019-12-21 ENCOUNTER — Encounter: Payer: Self-pay | Admitting: *Deleted

## 2019-12-21 DIAGNOSIS — R911 Solitary pulmonary nodule: Secondary | ICD-10-CM

## 2019-12-22 ENCOUNTER — Telehealth: Payer: Self-pay

## 2019-12-22 NOTE — Telephone Encounter (Signed)
Contacted pt to schedule Medicare Wellness. Pt didn't answer lvm asking pt to give a call back to schedule  °

## 2019-12-26 ENCOUNTER — Other Ambulatory Visit: Payer: Self-pay | Admitting: Podiatry

## 2019-12-29 ENCOUNTER — Other Ambulatory Visit: Payer: Self-pay

## 2019-12-29 ENCOUNTER — Inpatient Hospital Stay: Payer: Medicare HMO | Attending: Nurse Practitioner

## 2019-12-29 DIAGNOSIS — G4700 Insomnia, unspecified: Secondary | ICD-10-CM | POA: Diagnosis not present

## 2019-12-29 DIAGNOSIS — C561 Malignant neoplasm of right ovary: Secondary | ICD-10-CM | POA: Insufficient documentation

## 2019-12-29 DIAGNOSIS — M1990 Unspecified osteoarthritis, unspecified site: Secondary | ICD-10-CM | POA: Insufficient documentation

## 2019-12-29 DIAGNOSIS — K760 Fatty (change of) liver, not elsewhere classified: Secondary | ICD-10-CM | POA: Insufficient documentation

## 2019-12-29 DIAGNOSIS — R918 Other nonspecific abnormal finding of lung field: Secondary | ICD-10-CM | POA: Insufficient documentation

## 2019-12-29 DIAGNOSIS — Z79899 Other long term (current) drug therapy: Secondary | ICD-10-CM | POA: Diagnosis not present

## 2019-12-29 DIAGNOSIS — Z888 Allergy status to other drugs, medicaments and biological substances status: Secondary | ICD-10-CM | POA: Insufficient documentation

## 2019-12-29 DIAGNOSIS — R079 Chest pain, unspecified: Secondary | ICD-10-CM | POA: Insufficient documentation

## 2019-12-29 DIAGNOSIS — E119 Type 2 diabetes mellitus without complications: Secondary | ICD-10-CM | POA: Diagnosis not present

## 2019-12-29 DIAGNOSIS — D8989 Other specified disorders involving the immune mechanism, not elsewhere classified: Secondary | ICD-10-CM

## 2019-12-29 DIAGNOSIS — E559 Vitamin D deficiency, unspecified: Secondary | ICD-10-CM | POA: Insufficient documentation

## 2019-12-29 DIAGNOSIS — I10 Essential (primary) hypertension: Secondary | ICD-10-CM | POA: Diagnosis not present

## 2019-12-29 DIAGNOSIS — D3A090 Benign carcinoid tumor of the bronchus and lung: Secondary | ICD-10-CM | POA: Insufficient documentation

## 2019-12-29 DIAGNOSIS — C786 Secondary malignant neoplasm of retroperitoneum and peritoneum: Secondary | ICD-10-CM | POA: Insufficient documentation

## 2019-12-29 DIAGNOSIS — E041 Nontoxic single thyroid nodule: Secondary | ICD-10-CM | POA: Insufficient documentation

## 2019-12-29 DIAGNOSIS — Z800 Family history of malignant neoplasm of digestive organs: Secondary | ICD-10-CM | POA: Diagnosis not present

## 2019-12-29 DIAGNOSIS — R0602 Shortness of breath: Secondary | ICD-10-CM | POA: Insufficient documentation

## 2019-12-29 LAB — CMP (CANCER CENTER ONLY)
ALT: 33 U/L (ref 0–44)
AST: 29 U/L (ref 15–41)
Albumin: 4 g/dL (ref 3.5–5.0)
Alkaline Phosphatase: 60 U/L (ref 38–126)
Anion gap: 12 (ref 5–15)
BUN: 21 mg/dL — ABNORMAL HIGH (ref 6–20)
CO2: 25 mmol/L (ref 22–32)
Calcium: 11.7 mg/dL — ABNORMAL HIGH (ref 8.9–10.3)
Chloride: 104 mmol/L (ref 98–111)
Creatinine: 1.26 mg/dL — ABNORMAL HIGH (ref 0.44–1.00)
GFR, Est AFR Am: 56 mL/min — ABNORMAL LOW (ref >60)
GFR, Estimated: 48 mL/min — ABNORMAL LOW (ref >60)
Glucose, Bld: 159 mg/dL — ABNORMAL HIGH (ref 70–99)
Potassium: 3.9 mmol/L (ref 3.5–5.1)
Sodium: 141 mmol/L (ref 135–145)
Total Bilirubin: 0.6 mg/dL (ref 0.3–1.2)
Total Protein: 8.3 g/dL — ABNORMAL HIGH (ref 6.5–8.1)

## 2019-12-29 LAB — CBC WITH DIFFERENTIAL (CANCER CENTER ONLY)
Abs Immature Granulocytes: 0.02 10*3/uL (ref 0.00–0.07)
Basophils Absolute: 0 10*3/uL (ref 0.0–0.1)
Basophils Relative: 0 %
Eosinophils Absolute: 0.1 10*3/uL (ref 0.0–0.5)
Eosinophils Relative: 2 %
HCT: 42.6 % (ref 36.0–46.0)
Hemoglobin: 13.8 g/dL (ref 12.0–15.0)
Immature Granulocytes: 0 %
Lymphocytes Relative: 45 %
Lymphs Abs: 3.2 10*3/uL (ref 0.7–4.0)
MCH: 30.1 pg (ref 26.0–34.0)
MCHC: 32.4 g/dL (ref 30.0–36.0)
MCV: 93 fL (ref 80.0–100.0)
Monocytes Absolute: 0.7 10*3/uL (ref 0.1–1.0)
Monocytes Relative: 10 %
Neutro Abs: 3.1 10*3/uL (ref 1.7–7.7)
Neutrophils Relative %: 43 %
Platelet Count: 295 10*3/uL (ref 150–400)
RBC: 4.58 MIL/uL (ref 3.87–5.11)
RDW: 14 % (ref 11.5–15.5)
WBC Count: 7.1 10*3/uL (ref 4.0–10.5)
nRBC: 0 % (ref 0.0–0.2)

## 2019-12-30 LAB — KAPPA/LAMBDA LIGHT CHAINS
Kappa free light chain: 40.6 mg/L — ABNORMAL HIGH (ref 3.3–19.4)
Kappa, lambda light chain ratio: 2.45 — ABNORMAL HIGH (ref 0.26–1.65)
Lambda free light chains: 16.6 mg/L (ref 5.7–26.3)

## 2019-12-30 LAB — CA 125: Cancer Antigen (CA) 125: 18.5 U/mL (ref 0.0–38.1)

## 2019-12-30 NOTE — Progress Notes (Signed)
[place]   [phone] Fax:(336) [phone]   Clinic Follow up Note   Patient Care Team: Ladell Pier, MD as PCP - General (Internal Medicine)  Date of Service:  01/05/2020  CHIEF COMPLAINT: F/u of Light chain abnormality  SUMMARY OF ONCOLOGIC HISTORY: Oncology History Overview Note  T3    Primary peritoneal carcinomatosis ([place]) (Resolved)  08/21/2014 Initial Diagnosis   Primary peritoneal carcinomatosis      CURRENT THERAPY:  Observation  INTERVAL HISTORY:  Sarah [name] is here for a follow up. She was last seen by NP Lacie 6 months ago. She notes intermittent sharp chest pain and occasional SOB. She notes she is having SOB today. She can walk up to half a mile without stopping. She has inhaler. She plans to have bronchoscopy with Dr Roxan Hockey for 01/13/20 due to her indeterminate lung nodules. She notes she has chronic trouble sleeping. She can fall asleep but cannot stay asleep and trouble falling back asleep. She has tried Calm without help.    REVIEW OF SYSTEMS:   Constitutional: Denies fevers, chills or abnormal weight loss (+) Trouble sleeping Eyes: Denies blurriness of vision Ears, nose, mouth, throat, and face: Denies mucositis or sore throat Respiratory: Denies cough or wheezes (+) SOB  Cardiovascular: Denies palpitation, chest discomfort or lower extremity swelling Gastrointestinal:  Denies nausea, heartburn or change in bowel habits Skin: Denies abnormal skin rashes Lymphatics: Denies new lymphadenopathy or easy bruising Neurological:Denies numbness, tingling or new weaknesses Behavioral/Psych: Mood is stable, no new changes  All other systems were reviewed with the patient and are negative.  MEDICAL HISTORY:  Past Medical History:  Diagnosis Date  . Arthritis   . Family history of colon cancer   . Glaucoma 02/16  . Glucagonoma 07/28/14   Pt denies this but states she has glaucoma  . History of chemotherapy   .  Hypertension   . Imbalance   . Neuropathy    feet bilat  . Nocturia   . Peritoneal carcinomatosis ([place])    carcinoma of ovary   . Shortness of breath dyspnea    hx of 2013 - no problems currently   . Thyroid nodule    history of   . Wears glasses     SURGICAL HISTORY: Past Surgical History:  Procedure Laterality Date  . ABDOMINAL HYSTERECTOMY    . FOOT SURGERY  04/2018   [facility]  . INCISIONAL HERNIA REPAIR N/A 07/06/2015   Procedure: INCISIONAL HERNIA REPAIR ;  Surgeon: Rolm Bookbinder, MD;  Location: [facility];  Service: General;  Laterality: N/A;  . INSERTION OF MESH N/A 07/06/2015   Procedure: WITH INSERTION OF PHASIX ST MESH;  Surgeon: Rolm Bookbinder, MD;  Location: [facility];  Service: General;  Laterality: N/A;  . LAPAROTOMY N/A 07/06/2015   Procedure: EXPLORATORY LAPAROTOMY;  Surgeon: Everitt Amber, MD;  Location: [facility];  Service: Gynecology;  Laterality: N/A;  . LYSIS OF ADHESION N/A 07/06/2015   Procedure:  LYSIS OF ADHESION RESECTION OF MESENTERIC MASS BOWEL RESECTION ;  Surgeon: Everitt Amber, MD;  Location: [facility];  Service: Gynecology;  Laterality: N/A;  . ROBOTIC ASSISTED TOTAL HYSTERECTOMY WITH BILATERAL SALPINGO OOPHERECTOMY Bilateral 07/28/2014   Procedure: ROBOTIC ASSISTED lysis of adhesions with biopsies, converted to LAPAROTOMY, bilateral salpingoorphorectomy, omentectomy,appendectomy;  Surgeon: Janie Morning, MD;  Location: [facility];  Service: Gynecology;  Laterality: Bilateral;  . THYROIDECTOMY, PARTIAL      I have reviewed the social history and family history with the patient and they  are unchanged from previous note.  ALLERGIES:  is allergic to [facility] [aprepitant] and lisinopril.  MEDICATIONS:  Current Outpatient Medications  Medication Sig Dispense Refill  . albuterol (VENTOLIN HFA) 108 (90 Base) MCG/ACT inhaler Inhale 2 puffs into the lungs every 6 (six) hours as needed for wheezing or shortness of breath. 8 g 0  . amLODipine (NORVASC) 10 MG tablet Take 1  tablet (10 mg total) by mouth daily. 90 tablet 3  . Blood Glucose Monitoring Suppl (ACCU-CHEK AVIVA PLUS) w/Device KIT USE AS DIRECTED 1 kit 0  . Blood Glucose Monitoring Suppl (ACCU-CHEK AVIVA) device Use as instructed 1 each 0  . Cholecalciferol (VITAMIN D3) 10 MCG (400 UNIT) tablet Take 400 Units by mouth once a week.    . diclofenac sodium (VOLTAREN) 1 % GEL Apply 2 g topically 4 (four) times daily. (Patient taking differently: Apply 2 g topically 4 (four) times daily as needed (back pain.). ) 100 g 3  . gabapentin (NEURONTIN) 300 MG capsule T6AKE 1 CAPSULE BY MOUTH EVERY MORNING AND 1 CAPSULE AT NOON, AND 2 CAPSULES AT BEDTIME (Patient taking differently: Take 300-600 mg by mouth See admin instructions. Take 1 capsule (300 mg) by mouth every morning, take 1 capsule (300 mg) by mouth at noon, & take 2 tablets (600 mg) by mouth at bedtime.) 360 capsule 3  . glimepiride (AMARYL) 4 MG tablet Take 1 tablet (4 mg total) by mouth daily with breakfast. 90 tablet 3  . glucose blood (ACCU-CHEK AVIVA PLUS) test strip Use as instructed 100 each 12  . hydrALAZINE (APRESOLINE) 25 MG tablet Take 1.5 tablets (37.5 mg total) by mouth 3 (three) times daily. 405 tablet 3  . hydrochlorothiazide (HYDRODIURIL) 25 MG tablet Take 0.5 tablets (12.5 mg total) by mouth daily. 90 tablet 3  . insulin glargine (LANTUS SOLOSTAR) 100 UNIT/ML Solostar Pen Inject 30 units daily. Increase by 2 units every 3 days until morning sugars are < 130. Max = 40 units. (Patient taking differently: Inject 50 Units into the skin at bedtime. ) 15 mL 2  . Insulin Pen Needle 29G X 12MM MISC Use as directed. 100 each 6  . Lancets (ACCU-CHEK MULTICLIX) lancets   0  . Lancets (ACCU-CHEK SOFT TOUCH) lancets Use as instructed 100 each 12  . Lancets Misc. (ACCU-CHEK SOFTCLIX LANCET DEV) KIT Use as directed 1 kit 0  . losartan (COZAAR) 50 MG tablet Take 1 tablet (50 mg total) by mouth daily. 90 tablet 3  . meloxicam (MOBIC) 15 MG tablet TAKE 1 TABLET  EVERY DAY 90 tablet 1  . metFORMIN (GLUCOPHAGE) 1000 MG tablet Take 1,000 mg by mouth 2 (two) times daily.    . metoprolol tartrate (LOPRESSOR) 25 MG tablet TAKE 1/2 TABLET BY MOUTH 2 TIMES A DAY (Patient taking differently: Take 12.5 mg by mouth 2 (two) times daily. ) 90 tablet 3  . rosuvastatin (CRESTOR) 20 MG tablet Take 1 tablet (20 mg total) by mouth daily. Stop Atorvastatin 90 tablet 3  . timolol (BETIMOL) 0.5 % ophthalmic solution Place 1 drop into both eyes daily.    . Travoprost, BAK Free, (TRAVATAN) 0.004 % SOLN ophthalmic solution Place 1 drop into both eyes at bedtime.    . Vitamin D, Ergocalciferol, (DRISDOL) 1.25 MG (50000 UNIT) CAPS capsule TAKE 1 CAPSULE EVERY 7 DAYS (Patient not taking: Reported on 12/29/2019) 12 capsule 0   No current facility-administered medications for this visit.    PHYSICAL EXAMINATION: ECOG PERFORMANCE STATUS: 1 - Symptomatic but completely ambulatory  Vitals:   01/05/20 1053  BP: 122/76  Pulse: 76  Resp: 16  Temp: 97.7 F (36.5 C)  SpO2: 99%   Filed Weights   01/05/20 1053  Weight: (!) 232 lb 6.4 oz (105.4 kg)    Due to COVID19 we will limit examination to appearance. Patient had no complaints.  GENERAL:alert, no distress and comfortable SKIN: skin color normal, no rashes or significant lesions EYES: normal, Conjunctiva are pink and non-injected, sclera clear  NEURO: alert & oriented x 3 with fluent speech   LABORATORY DATA:  I have reviewed the data as listed CBC Latest Ref Rng & Units 12/29/2019 10/06/2019 09/29/2019  WBC 4.0 - 10.5 K/uL 7.1 6.1 6.6  Hemoglobin 12.0 - 15.0 g/dL 13.8 13.5 13.8  Hematocrit 36 - 46 % 42.6 41.5 43.0  Platelets 150 - 400 K/uL 295 268 316     CMP Latest Ref Rng & Units 12/29/2019 10/06/2019 09/29/2019  Glucose 70 - 99 mg/dL 159(H) 316(H) 317(H)  BUN 6 - 20 mg/dL 21(H) 17 17  Creatinine 0.44 - 1.00 mg/dL 1.26(H) 1.31(H) 1.32(H)  Sodium 135 - 145 mmol/L 141 141 138  Potassium 3.5 - 5.1 mmol/L 3.9 4.0 3.9   Chloride 98 - 111 mmol/L 104 102 100  CO2 22 - 32 mmol/L _0 Calcium 8.9 - 10.3 mg/dL 11.7(H) 10.9(H) 10.6(H)  Total Protein 6.5 - 8.1 g/dL 8.3(H) 7.7 8.0  Total Bilirubin 0.3 - 1.2 mg/dL 0.6 0.7 0.9  Alkaline Phos 38 - 126 U/L 60 58 66  AST 15 - 41 U/L 29 33 38  ALT 0 - 44 U/L 33 32 33     PET 10/26/19 IMPRESSION: 1. 11 mm central right lower lobe pulmonary nodule shows no hypermetabolism. While this is reassuring, well differentiated or low-grade neoplasm can be poorly FDG avid and close continued follow-up recommended. 2. Interval development of a 14 mm ill-defined nodular opacity in the posterior left lower lobe over the 9 day interval since prior CT. This shows low level FDG uptake and is almost assuredly atelectatic or infectious/inflammatory etiology. 3. Stable 1.7 cm right thyroid nodule without hypermetabolic FDG uptake on PET imaging. This has been documented on prior studies including thyroid ultrasound of 10/22/2019 (ref: J Am Coll Radiol. 2015 Feb;12(2): 143-50). 4. No findings on today's study to suggest hypermetabolic metastatic disease in the chest, abdomen, or pelvis. 5.  Aortic Atherosclerois (ICD10-170.0) 6. Hepatic steatosis.   PET 12/08/19  IMPRESSION: 1. Very low radiotracer activity associated with the RIGHT lobe pulmonary nodule is nonspecific. Findings would not be consistent with metastatic well-differentiated carcinoid tumor. Primary bronchial carcinoid can have low somatostatin receptor activity. Overall favor benign/indolent lesion. 2. No evidence of well differentiated neuroendocrine tumor within the abdomen pelvis.   RADIOGRAPHIC STUDIES: I have personally reviewed the radiological images as listed and agreed with the findings in the report. No results found.   ASSESSMENT & PLAN:  Sarah [name] is a 54 y.o. female with    1. Hypercalcemia, elevated serum kappa free light chains, likely MGUS, rule out MM  -She presented initially  with asymptomatic hypercalcemia, work up showed normal PTH (on low end normal range), normal TSH, and normal D 1,25 (OH)2 and low VitD 25(OH)2, rpPTH was not elevated  -her recent PET scan was negative for ovarian cancer recurrence, but she has a 72m right lung nodule with low uptake level, she is scheduled to have bronchoscopy biopsy by Dr. HRoxan Hockeynext week -SPEP showed Ig Kappa free light chain  elevation to 40.2, and increased kappa/lambda ratio; M spike was not detected  -If her bronchoscopy biopsy negative for malignancy, I think it is reasonable to rule out multiple myeloma based on the above hypercalcemia work-up.  Granuloma disease such as sarcoidosis is also a possibility, and sometimes we can diagnose based on the bone marrow biopsy. -I will wait until her bronchoscopy given her lung nodules. If lung show malignancy this could be cause of her hypercalcemia. If lungs are benign, will recommend bone marrow biopsy at that time. She is agreeable.  -Labs from last week reviewed, CBC and CMP WNL except BG 159, Cr 1.26, Ca 11.7, protein 8.3. Her CA 125 still normal.  -Phone call in 2 weeks for f/u.    2. Lung Nodules -She was seen to have 75m right lung nodule on 01/12/15 scan. This did increase to 127mon 10/13/19 CT scan.  -Her PET scan from 10/26/19 1136might lung nodule with no hypermetabolic uptake, a 3m07MAft lung nodule with low uptake and 1.7cm right thyroid nodule without hypermetabolic uptake.  -Her repeated PET from 12/08/19 was also indeterminate.  -Dr RosDenman Georgees not feel this is related to her prior ovarian cancer.   -She plans to have video bronchoscopy with Dr HenRoxan Hockey 01/13/20. Biopsy may be done.   3. 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  -Ca 125 was elevated at diagnosis, normalized after surgery and chemo -last CT 11/2018 showed NED. She will  continue to f/u with Dr RosDenman George-she is reportedly UTD on age-appropriate cancer screenings. She is being followed for a likely benign right breast mass which was stable in 01/2019 on mammogram/US  4. Vitamin D deficiency -found on work up in 05/2019 -Continue Vitd supplement   5. HTN, HL, DM -Continue weight loss program, f/u with dietician and PCP   6. Insomnia  -This has been chronic for her. She can fall asleep initially but has trouble staying asleep or falling back asleep.  -I discussed trying OTC Melatonin or benadryl. If still not enough she can speak with her PCP about prescription medications but this can lead to dependence with regular use. She voiced good understanding.    PLAN: -Proceed with Bronchoscopy and biopsy on 8/5 -Phone call in 2 weeks to determine bone marrow biopsy    No problem-specific Assessment & Plan notes found for this encounter.   No orders of the defined types were placed in this encounter.  All questions were answered. The patient knows to call the clinic with any problems, questions or concerns. No barriers to learning was detected. The total time spent in the appointment was 30 minutes.     YanTruitt MerleD 01/05/2020   I, AmoJoslyn Devonm acting as scribe for YanTruitt MerleD.   I have reviewed the above documentation for accuracy and completeness, and I agree with the above.

## 2020-01-03 LAB — MULTIPLE MYELOMA PANEL, SERUM
Albumin SerPl Elph-Mcnc: 4 g/dL (ref 2.9–4.4)
Albumin/Glob SerPl: 1.1 (ref 0.7–1.7)
Alpha 1: 0.1 g/dL (ref 0.0–0.4)
Alpha2 Glob SerPl Elph-Mcnc: 0.7 g/dL (ref 0.4–1.0)
B-Globulin SerPl Elph-Mcnc: 1.4 g/dL — ABNORMAL HIGH (ref 0.7–1.3)
Gamma Glob SerPl Elph-Mcnc: 1.5 g/dL (ref 0.4–1.8)
Globulin, Total: 3.7 g/dL (ref 2.2–3.9)
IgA: 191 mg/dL (ref 87–352)
IgG (Immunoglobin G), Serum: 1425 mg/dL (ref 586–1602)
IgM (Immunoglobulin M), Srm: 52 mg/dL (ref 26–217)
Total Protein ELP: 7.7 g/dL (ref 6.0–8.5)

## 2020-01-04 ENCOUNTER — Encounter: Payer: Self-pay | Admitting: Internal Medicine

## 2020-01-05 ENCOUNTER — Inpatient Hospital Stay: Payer: Medicare HMO | Admitting: Hematology

## 2020-01-05 ENCOUNTER — Encounter: Payer: Self-pay | Admitting: Hematology

## 2020-01-05 ENCOUNTER — Other Ambulatory Visit: Payer: Self-pay

## 2020-01-05 DIAGNOSIS — E559 Vitamin D deficiency, unspecified: Secondary | ICD-10-CM | POA: Diagnosis not present

## 2020-01-05 DIAGNOSIS — E119 Type 2 diabetes mellitus without complications: Secondary | ICD-10-CM | POA: Diagnosis not present

## 2020-01-05 DIAGNOSIS — R079 Chest pain, unspecified: Secondary | ICD-10-CM | POA: Diagnosis not present

## 2020-01-05 DIAGNOSIS — G4700 Insomnia, unspecified: Secondary | ICD-10-CM | POA: Diagnosis not present

## 2020-01-05 DIAGNOSIS — R0602 Shortness of breath: Secondary | ICD-10-CM | POA: Diagnosis not present

## 2020-01-05 DIAGNOSIS — D8989 Other specified disorders involving the immune mechanism, not elsewhere classified: Secondary | ICD-10-CM | POA: Diagnosis not present

## 2020-01-05 DIAGNOSIS — D808 Other immunodeficiencies with predominantly antibody defects: Secondary | ICD-10-CM | POA: Insufficient documentation

## 2020-01-05 DIAGNOSIS — C786 Secondary malignant neoplasm of retroperitoneum and peritoneum: Secondary | ICD-10-CM | POA: Diagnosis not present

## 2020-01-05 DIAGNOSIS — D3A090 Benign carcinoid tumor of the bronchus and lung: Secondary | ICD-10-CM | POA: Diagnosis not present

## 2020-01-05 DIAGNOSIS — R918 Other nonspecific abnormal finding of lung field: Secondary | ICD-10-CM | POA: Diagnosis not present

## 2020-01-05 DIAGNOSIS — C561 Malignant neoplasm of right ovary: Secondary | ICD-10-CM | POA: Diagnosis not present

## 2020-01-10 DIAGNOSIS — H524 Presbyopia: Secondary | ICD-10-CM | POA: Diagnosis not present

## 2020-01-10 NOTE — Pre-Procedure Instructions (Signed)
[place] ([place]), [place] - [address] [place] ([place]) [place] [address] Phone: [phone] Fax: [phone]      Your procedure is scheduled on Thursday August 5th.  Report to [facility] Main Entrance "A" at 8:30 A.M., and check in at the Admitting office.  Call this number if you have problems the morning of surgery:  [phone]  Call [phone] if you have any questions prior to your surgery date Monday-Friday 8am-4pm    Remember:  Do not eat after midnight the night before your surgery    Take these medicines the morning of surgery with A SIP OF WATER  amLODipine (NORVASC) 10 MG tablet  gabapentin (NEURONTIN) 300 MG capsule    hydrALAZINE (APRESOLINE) 25 MG table  metoprolol tartrate (LOPRESSOR) 25 MG tablet   rosuvastatin (CRESTOR) 20 MG tablet  timolol (BETIMOL) 0.5 % ophthalmic solution  albuterol inhaler as needed for wheezing or shortness of breath   WHAT DO I DO ABOUT MY DIABETES MEDICATION?   Marland Kitchen Do not take (Glimepiride or Metformin) oral diabetes medicine (pills) the morning of surgery.  . THE NIGHT BEFORE SURGERY, take __25_________ units of ___Lantus________insulin.     . The day of surgery, do not take other diabetes injectables, including Byetta (exenatide), Bydureon (exenatide ER), Victoza (liraglutide), or Trulicity (dulaglutide).  . If your CBG is greater than 220 mg/dL, you may take  of your sliding scale (correction) dose of insulin.   HOW TO MANAGE YOUR DIABETES BEFORE AND AFTER SURGERY  Why is it important to control my blood sugar before and after surgery? . Improving blood sugar levels before and after surgery helps healing and can limit problems. . A way of improving blood sugar control is eating a healthy diet by: o  Eating less sugar and carbohydrates o  Increasing activity/exercise o  Talking with your doctor about reaching your blood sugar goals . High blood sugars (greater than  180 mg/dL) can raise your risk of infections and slow your recovery, so you will need to focus on controlling your diabetes during the weeks before surgery. . Make sure that the doctor who takes care of your diabetes knows about your planned surgery including the date and location.  How do I manage my blood sugar before surgery? . Check your blood sugar at least 4 times a day, starting 2 days before surgery, to make sure that the level is not too high or low. . Check your blood sugar the morning of your surgery when you wake up and every 2 hours until you get to the Short Stay unit. o If your blood sugar is less than 70 mg/dL, you will need to treat for low blood sugar: - Do not take insulin. - Treat a low blood sugar (less than 70 mg/dL) with  cup of clear juice (cranberry or apple), 4 glucose tablets, OR glucose gel. - Recheck blood sugar in 15 minutes after treatment (to make sure it is greater than 70 mg/dL). If your blood sugar is not greater than 70 mg/dL on recheck, call [phone] for further instructions. . Report your blood sugar to the short stay nurse when you get to Short Stay.  . If you are admitted to the hospital after surgery: o Your blood sugar will be checked by the staff and you will probably be given insulin after surgery (instead of oral diabetes medicines) to make sure you have good blood sugar levels. o The goal for blood sugar control  after surgery is 80-180 mg/dL.    As of today, STOP taking any Aspirin (unless otherwise instructed by your surgeon) Aleve, Naproxen, Ibuprofen, Motrin, Advil, Goody's, BC's, all herbal medications, fish oil, and all vitamins.                      Do not wear jewelry, make up, or nail polish            Do not wear lotions, powders, perfumes or deodorant.            Do not shave 48 hours prior to surgery.              Do not bring valuables to the hospital.            [facility] is not responsible for any belongings or  valuables.  Do NOT Smoke (Tobacco/Vaping) or drink Alcohol 24 hours prior to your procedure If you use a CPAP at night, you may bring all equipment for your overnight stay.   Contacts, glasses, dentures or bridgework may not be worn into surgery.      For patients admitted to the hospital, discharge time will be determined by your treatment team.   Patients discharged the day of surgery will not be allowed to drive home, and someone needs to stay with them for 24 hours.    Special instructions:   [place]- Preparing For Surgery  Before surgery, you can play an important role. Because skin is not sterile, your skin needs to be as free of germs as possible. You can reduce the number of germs on your skin by washing with CHG (chlorahexidine gluconate) Soap before surgery.  CHG is an antiseptic cleaner which kills germs and bonds with the skin to continue killing germs even after washing.    Oral Hygiene is also important to reduce your risk of infection.  Remember - BRUSH YOUR TEETH THE MORNING OF SURGERY WITH YOUR REGULAR TOOTHPASTE  Please do not use if you have an allergy to CHG or antibacterial soaps. If your skin becomes reddened/irritated stop using the CHG.  Do not shave (including legs and underarms) for at least 48 hours prior to first CHG shower. It is OK to shave your face.  Please follow these instructions carefully.   1. Shower the NIGHT BEFORE SURGERY and the MORNING OF SURGERY with CHG Soap.   2. If you chose to wash your hair, wash your hair first as usual with your normal shampoo.  3. After you shampoo, rinse your hair and body thoroughly to remove the shampoo.  4. Use CHG as you would any other liquid soap. You can apply CHG directly to the skin and wash gently with a scrungie or a clean washcloth.   5. Apply the CHG Soap to your body ONLY FROM THE NECK DOWN.  Do not use on open wounds or open sores. Avoid contact with your eyes, ears, mouth and genitals (private  parts). Wash Face and genitals (private parts)  with your normal soap.   6. Wash thoroughly, paying special attention to the area where your surgery will be performed.  7. Thoroughly rinse your body with warm water from the neck down.  8. DO NOT shower/wash with your normal soap after using and rinsing off the CHG Soap.  9. Pat yourself dry with a CLEAN TOWEL.  10. Wear CLEAN PAJAMAS to bed the night before surgery  11. Place CLEAN SHEETS on your bed the night of  your first shower and DO NOT SLEEP WITH PETS.   Day of Surgery: Wear Clean/Comfortable clothing the morning of surgery Do not apply any deodorants/lotions.   Remember to brush your teeth WITH YOUR REGULAR TOOTHPASTE.   Please read over the following fact sheets that you were given.

## 2020-01-11 ENCOUNTER — Ambulatory Visit (HOSPITAL_COMMUNITY)
Admission: RE | Admit: 2020-01-11 | Discharge: 2020-01-11 | Disposition: A | Discharge status: Home / Self Care | Payer: Medicare HMO | Source: Ambulatory Visit | Attending: Thoracic Surgery (Cardiothoracic Vascular Surgery) | Admitting: Thoracic Surgery (Cardiothoracic Vascular Surgery)

## 2020-01-11 ENCOUNTER — Other Ambulatory Visit: Payer: Self-pay

## 2020-01-11 ENCOUNTER — Other Ambulatory Visit (HOSPITAL_COMMUNITY)
Admission: RE | Admit: 2020-01-11 | Discharge: 2020-01-11 | Disposition: A | Discharge status: Home / Self Care | Payer: Medicare HMO | Source: Ambulatory Visit | Attending: Thoracic Surgery (Cardiothoracic Vascular Surgery) | Admitting: Thoracic Surgery (Cardiothoracic Vascular Surgery)

## 2020-01-11 ENCOUNTER — Encounter (HOSPITAL_COMMUNITY): Payer: Self-pay

## 2020-01-11 ENCOUNTER — Encounter (HOSPITAL_COMMUNITY)
Admission: RE | Admit: 2020-01-11 | Discharge: 2020-01-11 | Disposition: A | Discharge status: Home / Self Care | Payer: Medicare HMO | Source: Ambulatory Visit | Attending: Thoracic Surgery (Cardiothoracic Vascular Surgery) | Admitting: Thoracic Surgery (Cardiothoracic Vascular Surgery)

## 2020-01-11 DIAGNOSIS — I700 Atherosclerosis of aorta: Secondary | ICD-10-CM | POA: Insufficient documentation

## 2020-01-11 DIAGNOSIS — Z20822 Contact with and (suspected) exposure to covid-19: Secondary | ICD-10-CM | POA: Insufficient documentation

## 2020-01-11 DIAGNOSIS — Z01818 Encounter for other preprocedural examination: Secondary | ICD-10-CM | POA: Insufficient documentation

## 2020-01-11 DIAGNOSIS — R911 Solitary pulmonary nodule: Secondary | ICD-10-CM

## 2020-01-11 DIAGNOSIS — G4733 Obstructive sleep apnea (adult) (pediatric): Secondary | ICD-10-CM | POA: Insufficient documentation

## 2020-01-11 DIAGNOSIS — R918 Other nonspecific abnormal finding of lung field: Secondary | ICD-10-CM | POA: Diagnosis not present

## 2020-01-11 DIAGNOSIS — E1165 Type 2 diabetes mellitus with hyperglycemia: Secondary | ICD-10-CM | POA: Insufficient documentation

## 2020-01-11 DIAGNOSIS — Z8543 Personal history of malignant neoplasm of ovary: Secondary | ICD-10-CM | POA: Insufficient documentation

## 2020-01-11 DIAGNOSIS — E785 Hyperlipidemia, unspecified: Secondary | ICD-10-CM | POA: Insufficient documentation

## 2020-01-11 DIAGNOSIS — I10 Essential (primary) hypertension: Secondary | ICD-10-CM | POA: Insufficient documentation

## 2020-01-11 DIAGNOSIS — H5213 Myopia, bilateral: Secondary | ICD-10-CM | POA: Diagnosis not present

## 2020-01-11 DIAGNOSIS — E1142 Type 2 diabetes mellitus with diabetic polyneuropathy: Secondary | ICD-10-CM | POA: Insufficient documentation

## 2020-01-11 HISTORY — DX: Family history of other specified conditions: Z84.89

## 2020-01-11 HISTORY — DX: Personal history of other diseases of the digestive system: Z87.19

## 2020-01-11 HISTORY — DX: Gastro-esophageal reflux disease without esophagitis: K21.9

## 2020-01-11 HISTORY — DX: Type 2 diabetes mellitus without complications: E11.9

## 2020-01-11 HISTORY — DX: Sleep apnea, unspecified: G47.30

## 2020-01-11 LAB — COMPREHENSIVE METABOLIC PANEL
ALT: 36 U/L (ref 0–44)
AST: 35 U/L (ref 15–41)
Albumin: 4 g/dL (ref 3.5–5.0)
Alkaline Phosphatase: 53 U/L (ref 38–126)
Anion gap: 12 (ref 5–15)
BUN: 17 mg/dL (ref 6–20)
CO2: 24 mmol/L (ref 22–32)
Calcium: 10.7 mg/dL — ABNORMAL HIGH (ref 8.9–10.3)
Chloride: 101 mmol/L (ref 98–111)
Creatinine, Ser: 1.1 mg/dL — ABNORMAL HIGH (ref 0.44–1.00)
GFR calc Af Amer: >60 mL/min (ref >60)
GFR calc non Af Amer: 57 mL/min — ABNORMAL LOW (ref >60)
Glucose, Bld: 168 mg/dL — ABNORMAL HIGH (ref 70–99)
Potassium: 3.9 mmol/L (ref 3.5–5.1)
Sodium: 137 mmol/L (ref 135–145)
Total Bilirubin: 1 mg/dL (ref 0.3–1.2)
Total Protein: 7.7 g/dL (ref 6.5–8.1)

## 2020-01-11 LAB — SARS CORONAVIRUS 2 (TAT 6-24 HRS): SARS Coronavirus 2: NEGATIVE

## 2020-01-11 LAB — CBC
HCT: 44.6 % (ref 36.0–46.0)
Hemoglobin: 14.3 g/dL (ref 12.0–15.0)
MCH: 30.3 pg (ref 26.0–34.0)
MCHC: 32.1 g/dL (ref 30.0–36.0)
MCV: 94.5 fL (ref 80.0–100.0)
Platelets: 319 10*3/uL (ref 150–400)
RBC: 4.72 MIL/uL (ref 3.87–5.11)
RDW: 13.6 % (ref 11.5–15.5)
WBC: 6.8 10*3/uL (ref 4.0–10.5)
nRBC: 0 % (ref 0.0–0.2)

## 2020-01-11 LAB — PROTIME-INR
INR: 1 (ref 0.8–1.2)
Prothrombin Time: 12.8 seconds (ref 11.4–15.2)

## 2020-01-11 LAB — GLUCOSE, CAPILLARY: Glucose-Capillary: 146 mg/dL — ABNORMAL HIGH (ref 70–99)

## 2020-01-11 LAB — HEMOGLOBIN A1C
Hgb A1c MFr Bld: 9 % — ABNORMAL HIGH (ref 4.8–5.6)
Mean Plasma Glucose: 211.6 mg/dL

## 2020-01-11 LAB — APTT: aPTT: 30 seconds (ref 24–36)

## 2020-01-11 NOTE — Progress Notes (Addendum)
PCP - [facility] Cardiologist -     Chest x-ray - 01/11/20 EKG - 01/11/20 Stress Test - yrs. Ago-normal ECHO - na Cardiac Cath - na  Sleep Study - 10/03/17 in Notes by Dr. Glynn Octave CPAP -  na  Fasting Blood Sugar - 118-160 Checks Blood Sugar ___3__ times a day  Blood Thinner Instructions:na Aspirin Instructions: na  ERAS Protcol -na   COVID TEST- 01/11/20   Anesthesia review:   Patient denies shortness of breath, fever, cough and chest pain at PAT appointment   All instructions explained to the patient, with a verbal understanding of the material. Patient agrees to go over the instructions while at home for a better understanding. Patient also instructed to self quarantine after being tested for COVID-19. The opportunity to ask questions was provided.

## 2020-01-12 NOTE — Progress Notes (Signed)
Anesthesia Chart Review:  Pertinent history includes DM with peripheral neuropathy(felt more so due to chemo), HTN, HL, OSA, midline LBP,stage IIIc low-grade serous carcinoma of the ovaryS/p BSO, omentectomy, appendectomy on 07/28/14. S/p adjuvant chemotherapy with 6 cycles of paclitaxel and carboplatin completed on 12/08/14. S/p an ex lap, hernia repair and small bowel resection for a presumed recurrence (benign pathology).  She is being evaluated for a right lung nodule.  She was recently noted to have hypercalcemia.  A CT of the chest abdomen pelvis was performed.  She was found to have an 11 mm smoothly marginated right lower lobe lung nodule.  This nodule was about 5 mm in 2016.  On PET CT there was no significant metabolic activity.  PET dotatate also showed no significant activity. She says that she has occasional wheezing and coughing.  She does not have any history of asthma.    Per notes, she can walk about half mile before needing to stop.  Preop labs reviewed, IDDM2 not well controlled with A1c 9.0.  Remainder of labs unremarkable.  Normal sinus rhythm.  Rate 68. Low voltage QRS. Lateral T wave inversions, no significant change from tracing in 2016.  CHEST - 2 VIEW 01/11/2020: COMPARISON:  None.  FINDINGS: The heart size and mediastinal contours are within normal limits. Known pulmonary nodules not well seen radiographically. No focal airspace consolidation, pleural effusion, or pneumothorax. The visualized skeletal structures are unremarkable.  IMPRESSION: No active cardiopulmonary disease.  CT chest 10/13/2019: IMPRESSION: 1. 11 mm central right lower lobe pulmonary nodule has increased from 4 mm on 01/12/2015. While somewhat unusual for metastatic ovarian cancer, primary bronchogenic neoplasm would also be a consideration. This may possibly be of bronchial origin (carcinoid). PET-CT recommended to further evaluate. 2. No evidence for metastatic disease in the abdomen or  pelvis. Prominent portal caval lymph node is unchanged since 11/26/2018. 3. Stable appearance of the supraumbilical ventral hernia containing small bowel loops without complicating features. 4. Hepatic steatosis. 5. 1.7 cm right thyroid nodule. Thyroid ultrasound recommended. (Ref: J Am Coll Radiol. 2015 Feb;12(2): 143-50). 6. Aortic Atherosclerosis (ICD10-I70.0).   Wynonia Musty [facility] Short Stay Center/Anesthesiology Phone [phone] 01/12/2020 10:26 AM

## 2020-01-12 NOTE — Anesthesia Preprocedure Evaluation (Addendum)
Anesthesia Evaluation  Patient identified by MRN, date of birth, ID band Patient awake    Reviewed: Allergy & Precautions, NPO status , Patient's Chart, lab work & pertinent test results, reviewed documented beta blocker date and time   History of Anesthesia Complications (+) Family history of anesthesia reaction  Airway Mallampati: III  TM Distance: >3 FB Neck ROM: Full    Dental  (+) Chipped, Dental Advisory Given,    Pulmonary shortness of breath, sleep apnea ,    Pulmonary exam normal breath sounds clear to auscultation       Cardiovascular hypertension, Pt. on home beta blockers and Pt. on medications Normal cardiovascular exam Rhythm:Regular Rate:Normal     Neuro/Psych negative neurological ROS  negative psych ROS   GI/Hepatic Neg liver ROS, hiatal hernia, GERD  ,  Endo/Other  diabetes, Poorly Controlled, Type 2Morbid obesity  Renal/GU Renal disease     Musculoskeletal  (+) Arthritis ,   Abdominal (+) + obese,   Peds  Hematology negative hematology ROS (+)   Anesthesia Other Findings   Reproductive/Obstetrics negative OB ROS                                                             Anesthesia Evaluation  Patient identified by MRN, date of birth, ID band Patient awake    Reviewed: Allergy & Precautions, H&P , NPO status , Patient's Chart, lab work & pertinent test results, reviewed documented beta blocker date and time   Airway Mallampati: II  TM Distance: >3 FB Neck ROM: full    Dental  (+) Chipped, Dental Advisory Given Chipped tooth right upper front:   Pulmonary shortness of breath and with exertion,    Pulmonary exam normal breath sounds clear to auscultation       Cardiovascular Exercise Tolerance: Good hypertension, Pt. on home beta blockers and Pt. on medications Normal cardiovascular exam Rhythm:regular Rate:Normal  EKG 07/2014 ST Changes no acute  change   Neuro/Psych negative neurological ROS  negative psych ROS   GI/Hepatic negative GI ROS, Neg liver ROS,   Endo/Other  negative endocrine ROSglucogonoma  Renal/GU negative Renal ROS   Ovarian CA SP oophorectomy, omentectomy and Chemo, has recurrent mesenteric mass, also hernia    Musculoskeletal   Abdominal (+) + obese,   Peds  Hematology negative hematology ROS (+) 12/36   Anesthesia Other Findings   Reproductive/Obstetrics negative OB ROS                            Anesthesia Physical  Anesthesia Plan  ASA: III  Anesthesia Plan: General   Post-op Pain Management:    Induction: Intravenous  Airway Management Planned: Oral ETT  Additional Equipment:   Intra-op Plan:   Post-operative Plan: Extubation in OR  Informed Consent: I have reviewed the patients History and Physical, chart, labs and discussed the procedure including the risks, benefits and alternatives for the proposed anesthesia with the patient or authorized representative who has indicated his/her understanding and acceptance.   Dental Advisory Given  Plan Discussed with: CRNA and Surgeon  Anesthesia Plan Comments:         Anesthesia Quick Evaluation  Anesthesia Physical Anesthesia Plan  ASA: III  Anesthesia Plan: General   Post-op Pain Management:  Induction: Intravenous  PONV Risk Score and Plan: 3 and Ondansetron, Midazolam, Treatment may vary due to age or medical condition and Metaclopromide  Airway Management Planned: Oral ETT  Additional Equipment: None  Intra-op Plan:   Post-operative Plan: Extubation in OR  Informed Consent: I have reviewed the patients History and Physical, chart, labs and discussed the procedure including the risks, benefits and alternatives for the proposed anesthesia with the patient or authorized representative who has indicated his/her understanding and acceptance.     Dental advisory given  Plan  Discussed with: CRNA  Anesthesia Plan Comments: (PAT note by Karoline Caldwell, PA-C: Pertinent history includes DM with peripheral neuropathy(felt more so due to chemo), HTN, HL, OSA, midline LBP,stage IIIc low-grade serous carcinoma of the ovaryS/p BSO, omentectomy, appendectomy on 07/28/14. S/p adjuvant chemotherapy with 6 cycles of paclitaxel and carboplatin completed on 12/08/14. S/p an ex lap, hernia repair and small bowel resection for a presumed recurrence (benign pathology).  She is being evaluated for a right lung nodule.  She was recently noted to have hypercalcemia.  A CT of the chest abdomen pelvis was performed.  She was found to have an 11 mm smoothly marginated right lower lobe lung nodule.  This nodule was about 5 mm in 2016.  On PET CT there was no significant metabolic activity.  PET dotatate also showed no significant activity. She says that she has occasional wheezing and coughing.  She does not have any history of asthma.    Per notes, she can walk about half mile before needing to stop.  Preop labs reviewed, IDDM2 not well controlled with A1c 9.0.  Remainder of labs unremarkable.  Normal sinus rhythm.  Rate 68. Low voltage QRS. Lateral T wave inversions, no significant change from tracing in 2016.  CHEST - 2 VIEW 01/11/2020: COMPARISON:  None.  FINDINGS: The heart size and mediastinal contours are within normal limits. Known pulmonary nodules not well seen radiographically. No focal airspace consolidation, pleural effusion, or pneumothorax. The visualized skeletal structures are unremarkable.  IMPRESSION: No active cardiopulmonary disease.  CT chest 10/13/2019: IMPRESSION: 1. 11 mm central right lower lobe pulmonary nodule has increased from 4 mm on 01/12/2015. While somewhat unusual for metastatic ovarian cancer, primary bronchogenic neoplasm would also be a consideration. This may possibly be of bronchial origin (carcinoid). PET-CT recommended to further evaluate. 2.  No evidence for metastatic disease in the abdomen or pelvis. Prominent portal caval lymph node is unchanged since 11/26/2018. 3. Stable appearance of the supraumbilical ventral hernia containing small bowel loops without complicating features. 4. Hepatic steatosis. 5. 1.7 cm right thyroid nodule. Thyroid ultrasound recommended. (Ref: J Am Coll Radiol. 2015 Feb;12(2): 143-50). 6. Aortic Atherosclerosis (ICD10-I70.0).  )       Anesthesia Quick Evaluation

## 2020-01-13 ENCOUNTER — Ambulatory Visit (HOSPITAL_COMMUNITY): Payer: Medicare HMO | Admitting: Physician Assistant

## 2020-01-13 ENCOUNTER — Ambulatory Visit (HOSPITAL_COMMUNITY): Payer: Medicare HMO | Admitting: Certified Registered Nurse Anesthetist

## 2020-01-13 ENCOUNTER — Other Ambulatory Visit: Payer: Self-pay

## 2020-01-13 ENCOUNTER — Ambulatory Visit (HOSPITAL_COMMUNITY)
Admission: RE | Admit: 2020-01-13 | Discharge: 2020-01-13 | Disposition: A | Discharge status: Home / Self Care | Payer: Medicare HMO | Attending: Thoracic Surgery (Cardiothoracic Vascular Surgery) | Admitting: Thoracic Surgery (Cardiothoracic Vascular Surgery)

## 2020-01-13 ENCOUNTER — Encounter (HOSPITAL_COMMUNITY)
Admission: RE | Disposition: A | Discharge status: Home / Self Care | Payer: Self-pay | Source: Home / Self Care | Attending: Thoracic Surgery (Cardiothoracic Vascular Surgery)

## 2020-01-13 ENCOUNTER — Encounter (HOSPITAL_COMMUNITY): Payer: Self-pay | Admitting: Thoracic Surgery (Cardiothoracic Vascular Surgery)

## 2020-01-13 DIAGNOSIS — K760 Fatty (change of) liver, not elsewhere classified: Secondary | ICD-10-CM | POA: Diagnosis not present

## 2020-01-13 DIAGNOSIS — I700 Atherosclerosis of aorta: Secondary | ICD-10-CM | POA: Diagnosis not present

## 2020-01-13 DIAGNOSIS — M1990 Unspecified osteoarthritis, unspecified site: Secondary | ICD-10-CM | POA: Insufficient documentation

## 2020-01-13 DIAGNOSIS — M109 Gout, unspecified: Secondary | ICD-10-CM | POA: Diagnosis not present

## 2020-01-13 DIAGNOSIS — I10 Essential (primary) hypertension: Secondary | ICD-10-CM | POA: Insufficient documentation

## 2020-01-13 DIAGNOSIS — K439 Ventral hernia without obstruction or gangrene: Secondary | ICD-10-CM | POA: Diagnosis not present

## 2020-01-13 DIAGNOSIS — E785 Hyperlipidemia, unspecified: Secondary | ICD-10-CM | POA: Insufficient documentation

## 2020-01-13 DIAGNOSIS — Z791 Long term (current) use of non-steroidal anti-inflammatories (NSAID): Secondary | ICD-10-CM | POA: Insufficient documentation

## 2020-01-13 DIAGNOSIS — Z794 Long term (current) use of insulin: Secondary | ICD-10-CM | POA: Diagnosis not present

## 2020-01-13 DIAGNOSIS — Z8543 Personal history of malignant neoplasm of ovary: Secondary | ICD-10-CM | POA: Diagnosis not present

## 2020-01-13 DIAGNOSIS — Z79899 Other long term (current) drug therapy: Secondary | ICD-10-CM | POA: Insufficient documentation

## 2020-01-13 DIAGNOSIS — E119 Type 2 diabetes mellitus without complications: Secondary | ICD-10-CM | POA: Diagnosis not present

## 2020-01-13 DIAGNOSIS — E041 Nontoxic single thyroid nodule: Secondary | ICD-10-CM | POA: Insufficient documentation

## 2020-01-13 DIAGNOSIS — Z8249 Family history of ischemic heart disease and other diseases of the circulatory system: Secondary | ICD-10-CM | POA: Diagnosis not present

## 2020-01-13 DIAGNOSIS — R911 Solitary pulmonary nodule: Secondary | ICD-10-CM | POA: Insufficient documentation

## 2020-01-13 DIAGNOSIS — Z9221 Personal history of antineoplastic chemotherapy: Secondary | ICD-10-CM | POA: Diagnosis not present

## 2020-01-13 DIAGNOSIS — E1140 Type 2 diabetes mellitus with diabetic neuropathy, unspecified: Secondary | ICD-10-CM | POA: Insufficient documentation

## 2020-01-13 HISTORY — PX: VIDEO BRONCHOSCOPY: SHX5072

## 2020-01-13 LAB — GLUCOSE, CAPILLARY
Glucose-Capillary: 178 mg/dL — ABNORMAL HIGH (ref 70–99)
Glucose-Capillary: 246 mg/dL — ABNORMAL HIGH (ref 70–99)
Glucose-Capillary: 299 mg/dL — ABNORMAL HIGH (ref 70–99)
Glucose-Capillary: 275 mg/dL — ABNORMAL HIGH (ref 70–99)
Glucose-Capillary: 308 mg/dL — ABNORMAL HIGH (ref 70–99)

## 2020-01-13 SURGERY — BRONCHOSCOPY, VIDEO-ASSISTED
Anesthesia: General

## 2020-01-13 MED ORDER — LIDOCAINE 2% (20 MG/ML) 5 ML SYRINGE
INTRAMUSCULAR | Status: DC | PRN
  Administered 2020-01-13: 100 mg via INTRAVENOUS

## 2020-01-13 MED ORDER — ONDANSETRON HCL 4 MG/2ML IJ SOLN
INTRAMUSCULAR | Status: DC | PRN
  Administered 2020-01-13: 4 mg via INTRAVENOUS

## 2020-01-13 MED ORDER — ORAL CARE MOUTH RINSE: 15.0000 mL | Freq: Once | OROMUCOSAL | Status: AC

## 2020-01-13 MED ORDER — MEPERIDINE HCL 25 MG/ML IJ SOLN: 6.2500 mg | INTRAMUSCULAR | Status: DC | PRN

## 2020-01-13 MED ORDER — ONDANSETRON HCL 4 MG/2ML IJ SOLN
INTRAMUSCULAR | Status: AC
  Filled 2020-01-13: qty 2

## 2020-01-13 MED ORDER — SUGAMMADEX SODIUM 200 MG/2ML IV SOLN
INTRAVENOUS | Status: DC | PRN
  Administered 2020-01-13: 200 mg via INTRAVENOUS

## 2020-01-13 MED ORDER — 0.9 % SODIUM CHLORIDE (POUR BTL) OPTIME
TOPICAL | Status: DC | PRN
  Administered 2020-01-13: 1000 mL

## 2020-01-13 MED ORDER — INSULIN ASPART 100 UNIT/ML ~~LOC~~ SOLN
5.0000 [IU] | Freq: Once | SUBCUTANEOUS | Status: AC
  Administered 2020-01-13: 5 [IU] via SUBCUTANEOUS

## 2020-01-13 MED ORDER — GLIMEPIRIDE 4 MG PO TABS
4.0000 mg | ORAL_TABLET | Freq: Every day | ORAL | Status: DC
  Filled 2020-01-13 (×3): qty 1

## 2020-01-13 MED ORDER — PROPOFOL 10 MG/ML IV BOLUS
INTRAVENOUS | Status: AC
  Filled 2020-01-13: qty 20

## 2020-01-13 MED ORDER — PROMETHAZINE HCL 25 MG/ML IJ SOLN: 6.2500 mg | INTRAMUSCULAR | Status: DC | PRN

## 2020-01-13 MED ORDER — INSULIN ASPART 100 UNIT/ML ~~LOC~~ SOLN
SUBCUTANEOUS | Status: AC
  Filled 2020-01-13: qty 1

## 2020-01-13 MED ORDER — CHLORHEXIDINE GLUCONATE 0.12 % MT SOLN
15.0000 mL | Freq: Once | OROMUCOSAL | Status: AC
  Administered 2020-01-13: 15 mL via OROMUCOSAL
  Filled 2020-01-13: qty 15

## 2020-01-13 MED ORDER — FENTANYL CITRATE (PF) 100 MCG/2ML IJ SOLN: 25.0000 ug | INTRAMUSCULAR | Status: DC | PRN

## 2020-01-13 MED ORDER — PHENYLEPHRINE HCL-NACL 10-0.9 MG/250ML-% IV SOLN
INTRAVENOUS | Status: DC | PRN
  Administered 2020-01-13: 25 ug/min via INTRAVENOUS

## 2020-01-13 MED ORDER — INSULIN ASPART 100 UNIT/ML ~~LOC~~ SOLN
3.0000 [IU] | Freq: Once | SUBCUTANEOUS | Status: AC
  Administered 2020-01-13: 3 [IU] via SUBCUTANEOUS

## 2020-01-13 MED ORDER — METFORMIN HCL 500 MG PO TABS
1000.0000 mg | ORAL_TABLET | Freq: Two times a day (BID) | ORAL | Status: DC
  Filled 2020-01-13 (×2): qty 2

## 2020-01-13 MED ORDER — DEXAMETHASONE SODIUM PHOSPHATE 10 MG/ML IJ SOLN
INTRAMUSCULAR | Status: AC
  Filled 2020-01-13: qty 1

## 2020-01-13 MED ORDER — DEXAMETHASONE SODIUM PHOSPHATE 10 MG/ML IJ SOLN
INTRAMUSCULAR | Status: DC | PRN
  Administered 2020-01-13: 4 mg via INTRAVENOUS

## 2020-01-13 MED ORDER — PROPOFOL 10 MG/ML IV BOLUS
INTRAVENOUS | Status: DC | PRN
  Administered 2020-01-13: 200 mg via INTRAVENOUS

## 2020-01-13 MED ORDER — MIDAZOLAM HCL 5 MG/5ML IJ SOLN
INTRAMUSCULAR | Status: DC | PRN
  Administered 2020-01-13: 2 mg via INTRAVENOUS

## 2020-01-13 MED ORDER — LACTATED RINGERS IV SOLN: INTRAVENOUS | Status: DC

## 2020-01-13 MED ORDER — MIDAZOLAM HCL 2 MG/2ML IJ SOLN
INTRAMUSCULAR | Status: AC
  Filled 2020-01-13: qty 2

## 2020-01-13 MED ORDER — PHENYLEPHRINE 40 MCG/ML (10ML) SYRINGE FOR IV PUSH (FOR BLOOD PRESSURE SUPPORT)
PREFILLED_SYRINGE | INTRAVENOUS | Status: AC
  Filled 2020-01-13: qty 10

## 2020-01-13 MED ORDER — FENTANYL CITRATE (PF) 250 MCG/5ML IJ SOLN
INTRAMUSCULAR | Status: AC
  Filled 2020-01-13: qty 5

## 2020-01-13 MED ORDER — ROCURONIUM BROMIDE 10 MG/ML (PF) SYRINGE
PREFILLED_SYRINGE | INTRAVENOUS | Status: DC | PRN
  Administered 2020-01-13: 50 mg via INTRAVENOUS

## 2020-01-13 MED ORDER — EPINEPHRINE PF 1 MG/ML IJ SOLN
INTRAMUSCULAR | Status: DC | PRN
  Administered 2020-01-13: 1 mg via ENDOTRACHEOPULMONARY

## 2020-01-13 MED ORDER — ALBUTEROL SULFATE HFA 108 (90 BASE) MCG/ACT IN AERS
INHALATION_SPRAY | RESPIRATORY_TRACT | Status: DC | PRN
  Administered 2020-01-13: 6 via RESPIRATORY_TRACT

## 2020-01-13 MED ORDER — FENTANYL CITRATE (PF) 100 MCG/2ML IJ SOLN
INTRAMUSCULAR | Status: DC | PRN
  Administered 2020-01-13: 100 ug via INTRAVENOUS

## 2020-01-13 SURGICAL SUPPLY — 41 items
ADAPTER VALVE BIOPSY EBUS (MISCELLANEOUS) IMPLANT
ADPTR VALVE BIOPSY EBUS (MISCELLANEOUS)
BLADE CLIPPER SURG (BLADE) ×3 IMPLANT
BRUSH CYTOL CELLEBRITY 1.5X140 (MISCELLANEOUS) ×3 IMPLANT
BRUSH SUPERTRAX NDL-TIP CYTO (INSTRUMENTS) ×3 IMPLANT
CANISTER SUCT 3000ML PPV (MISCELLANEOUS) ×3 IMPLANT
CNTNR URN SCR LID CUP LEK RST (MISCELLANEOUS) ×2 IMPLANT
CONT SPEC 4OZ STRL OR WHT (MISCELLANEOUS) ×6
COVER BACK TABLE 60X90IN (DRAPES) ×3 IMPLANT
FILTER STRAW FLUID ASPIR (MISCELLANEOUS) IMPLANT
FORCEPS BIOP RJ4 1.8 (CUTTING FORCEPS) IMPLANT
FORCEPS BIOP SUPERTRX PREMAR (INSTRUMENTS) ×3 IMPLANT
FORCEPS RADIAL JAW LRG 4 PULM (INSTRUMENTS) ×1 IMPLANT
GAUZE SPONGE 4X4 12PLY STRL (GAUZE/BANDAGES/DRESSINGS) IMPLANT
GLOVE SURG SIGNA 7.5 PF LTX (GLOVE) ×3 IMPLANT
GOWN STRL REUS W/ TWL XL LVL3 (GOWN DISPOSABLE) ×1 IMPLANT
GOWN STRL REUS W/TWL XL LVL3 (GOWN DISPOSABLE) ×3
KIT CLEAN ENDO COMPLIANCE (KITS) ×6 IMPLANT
KIT TURNOVER KIT B (KITS) ×3 IMPLANT
MARKER SKIN DUAL TIP RULER LAB (MISCELLANEOUS) ×3 IMPLANT
NEEDLE ASPIRATION VIZISHOT 19G (NEEDLE) IMPLANT
NEEDLE ASPIRATION VIZISHOT 21G (NEEDLE) IMPLANT
NEEDLE BLUNT 18X1 FOR OR ONLY (NEEDLE) IMPLANT
NEEDLE SUPERTRX PREMARK BIOPSY (NEEDLE) ×3 IMPLANT
NS IRRIG 1000ML POUR BTL (IV SOLUTION) ×3 IMPLANT
OIL SILICONE PENTAX (PARTS (SERVICE/REPAIRS)) ×3 IMPLANT
PAD ARMBOARD 7.5X6 YLW CONV (MISCELLANEOUS) ×6 IMPLANT
RADIAL JAW LRG 4 PULMONARY (INSTRUMENTS) ×2
SYR 20ML ECCENTRIC (SYRINGE) ×3 IMPLANT
SYR 20ML LL LF (SYRINGE) ×3 IMPLANT
SYR 3ML LL SCALE MARK (SYRINGE) IMPLANT
SYR 5ML LL (SYRINGE) IMPLANT
SYR 5ML LUER SLIP (SYRINGE) IMPLANT
TOWEL GREEN STERILE (TOWEL DISPOSABLE) ×3 IMPLANT
TOWEL GREEN STERILE FF (TOWEL DISPOSABLE) ×3 IMPLANT
TUBE CONNECTING 20'X1/4 (TUBING) ×1
TUBE CONNECTING 20X1/4 (TUBING) ×2 IMPLANT
VALVE BIOPSY  SINGLE USE (MISCELLANEOUS) ×3
VALVE BIOPSY SINGLE USE (MISCELLANEOUS) ×1 IMPLANT
VALVE SUCTION BRONCHIO DISP (MISCELLANEOUS) ×6 IMPLANT
WATER STERILE IRR 1000ML POUR (IV SOLUTION) ×3 IMPLANT

## 2020-01-13 NOTE — Interval H&P Note (Signed)
History and Physical Interval Note:  01/13/2020 7:53 AM  Sarah [name]  has presented today for surgery, with the diagnosis of RIGHT LOWER LOBE LUNG NODULE.  The various methods of treatment have been discussed with the patient and family. After consideration of risks, benefits and other options for treatment, the patient has consented to  Procedure(s): VIDEO BRONCHOSCOPY (N/A) VIDEO BRONCHOSCOPY WITH ENDOBRONCHIAL ULTRASOUND (N/A) as a surgical intervention.  The patient's history has been reviewed, patient examined, no change in status, stable for surgery.  I have reviewed the patient's chart and labs.  Questions were answered to the patient's satisfaction.     Melrose Nakayama

## 2020-01-13 NOTE — Anesthesia Postprocedure Evaluation (Signed)
Anesthesia Post Note  Patient: Sarah [name]  Procedure(s) Performed: VIDEO BRONCHOSCOPY WITH BIOPSIES (N/A )     Patient location during evaluation: PACU Anesthesia Type: General Level of consciousness: sedated and patient cooperative Pain management: pain level controlled Vital Signs Assessment: post-procedure vital signs reviewed and stable Respiratory status: spontaneous breathing Cardiovascular status: stable Anesthetic complications: no Comments: Hyperglycemic in PACU, Insulin and home meds given.   No complications documented.  Last Vitals:  Vitals:   01/13/20 0915 01/13/20 0930  BP: 130/78 (!) 142/84  Pulse: 93 92  Resp: 20 13  Temp:  36.6 C  SpO2: 93% 100%    Last Pain:  Vitals:   01/13/20 0930  TempSrc:   PainSc: 0-No pain                 Nolon Nations

## 2020-01-13 NOTE — Brief Op Note (Signed)
01/13/2020  9:02 AM  PATIENT:  Druscilla Brownie  54 y.o. female  PRE-OPERATIVE DIAGNOSIS:  RIGHT LOWER LOBE LUNG NODULE  POST-OPERATIVE DIAGNOSIS:  RIGHT LOWER LOBE LUNG NODULE, PROBABLE CARCINOID TUMOR  PROCEDURE: VIDEO BRONCHOSCOPY with Needle aspirations, Brushings and Endobronchial biopsies  SURGEON:  Surgeon(s) and Role:    * Melrose Nakayama, MD - Primary  PHYSICIAN ASSISTANT:   ASSISTANTS: none   ANESTHESIA:   general  EBL:  0 mL   BLOOD ADMINISTERED:none  DRAINS: none   LOCAL MEDICATIONS USED:  NONE  SPECIMEN:  Source of Specimen:  RLL endobronchial mass  DISPOSITION OF SPECIMEN:  PATHOLOGY  COUNTS:  NO endoscopic  TOURNIQUET:  * No tourniquets in log *  DICTATION: .Other Dictation: Dictation Number -  PLAN OF CARE: Discharge to home after PACU  PATIENT DISPOSITION:  PACU - hemodynamically stable.   Delay start of Pharmacological VTE agent (>24hrs) due to surgical blood loss or risk of bleeding: not applicable

## 2020-01-13 NOTE — Transfer of Care (Signed)
Immediate Anesthesia Transfer of Care Note  Patient: Sarah [name]  Procedure(s) Performed: VIDEO BRONCHOSCOPY (N/A ) VIDEO BRONCHOSCOPY WITH ENDOBRONCHIAL ULTRASOUND (N/A )  Patient Location: PACU  Anesthesia Type:General  Level of Consciousness: awake and alert   Airway & Oxygen Therapy: Patient Spontanous Breathing and Patient connected to nasal cannula oxygen  Post-op Assessment: Report given to RN, Post -op Vital signs reviewed and stable and Patient moving all extremities X 4  Post vital signs: Reviewed and stable  Last Vitals:  Vitals Value Taken Time  BP 148/86 01/13/20 0900  Temp    Pulse 98 01/13/20 0902  Resp 26 01/13/20 0902  SpO2 95 % 01/13/20 0902  Vitals shown include unvalidated device data.  Last Pain:  Vitals:   01/13/20 0732  TempSrc: Oral      Patients Stated Pain Goal: 2 (33/38/32 9191)  Complications: No complications documented.

## 2020-01-13 NOTE — Progress Notes (Signed)
CBG is trending back down from 308 to now 375, ok for patient to discharge and take home medications when she gets home per Dr. Lissa Hoard. Updated son and he understands the importance of Leela taking his medications at home.  Rowe Pavy, RN

## 2020-01-13 NOTE — Op Note (Signed)
NAMEREBECKA, OELKERS MEDICAL RECORD ER:[id number] ACCOUNT [id number] DATE OF BIRTH:[birth year] FACILITY: [facility] LOCATION: [facility] PHYSICIAN:Stellah Donovan Chaya Jan, MD  OPERATIVE REPORT  DATE OF PROCEDURE:  01/13/2020  PREOPERATIVE DIAGNOSIS:  Right lower lobe lung nodule.  POSTOPERATIVE DIAGNOSIS:  Endobronchial right lower lobe lung nodule, possible carcinoid tumor.  PROCEDURE:  Video bronchoscopy with needle aspirations, brushings and endobronchial biopsies.  SURGEON:  Modesto Charon, MD  ASSISTANT:  None  ANESTHESIA:  General.  FINDINGS:  Endobronchial lesion in the basilar segmental bronchus difficult to biopsy due to angulation.  Quick prep showed atypical cells.  Minimal bleeding with biopsies.  CLINICAL NOTE:  Ms. Huizinga is a 54 year old woman with a history of ovarian cancer who recently had a CT for followup of her ovarian cancer post-treatment.  She was found to have a smoothly marginated right lower lobe lung nodule.  On PET CT, there was  no significant activity.  There also was no significant activity on PET CT dotatate scan.  She was advised to undergo bronchoscopy for further evaluation.  The indications, risks, benefits, and alternatives were discussed in detail with the patient.  She  understood and accepted the risks and agreed to proceed.  OPERATIVE NOTE:  Ms. Cousineau was brought to the operating room on 01/13/2020.  She had induction of general anesthesia and was intubated.  A timeout was performed.  Flexible fiberoptic bronchoscopy then was performed.  The trachea and left bronchial tree  were within normal limits.  On the right side, the right mainstem, right upper lobe, bronchus intermedius and right middle lobe bronchi all were within normal limits.  In the basilar segmental bronchus there was an endobronchial mass lesion seen.  This  was approximately a centimeter to a centimeter and a half in size.  There was some narrowing of the adjacent segmental bronchus.   The lesion had a very firm consistency.  Needle aspirations were performed followed by brushings.  The prep on those showed  atypical cells, but no definite diagnosis could be made.  Multiple endobronchial biopsies then were performed using both standard as well as the navigational bronchoscopy biopsy forceps.  The lesion was very firm and the biopsy forceps tended to slide  off as they were closed.  There was some mild bleeding with biopsies which was controlled with topical dilute epinephrine solution.  An attempt was made to get a sample with a needle brush as well.  That also showed atypical cells.  Multiple additional  biopsies were obtained.  All were sent for permanent pathology.  Final inspection was made with the bronchoscope and there was no significant bleeding.  The bronchoscope was removed.  The patient was extubated in the operating room and taken to the  postanesthetic care unit in good condition.  CN/NUANCE  D:01/13/2020 T:01/13/2020 JOB:[id number]

## 2020-01-13 NOTE — Anesthesia Procedure Notes (Signed)
Procedure Name: Intubation Date/Time: 01/13/2020 8:09 AM Performed by: Inda Coke, CRNA Pre-anesthesia Checklist: Patient identified, Emergency Drugs available, Suction available and Patient being monitored Patient Re-evaluated:Patient Re-evaluated prior to induction Oxygen Delivery Method: Circle System Utilized Preoxygenation: Pre-oxygenation with 100% oxygen Induction Type: IV induction Ventilation: Mask ventilation without difficulty and Oral airway inserted - appropriate to patient size Laryngoscope Size: Mac and 3 Grade View: Grade II Tube type: Oral Tube size: 8.5 mm Number of attempts: 1 Airway Equipment and Method: Stylet and Oral airway Placement Confirmation: ETT inserted through vocal cords under direct vision,  positive ETCO2 and breath sounds checked- equal and bilateral Secured at: 22 cm Tube secured with: Tape Dental Injury: Teeth and Oropharynx as per pre-operative assessment

## 2020-01-14 ENCOUNTER — Encounter (HOSPITAL_COMMUNITY): Payer: Self-pay | Admitting: Thoracic Surgery (Cardiothoracic Vascular Surgery)

## 2020-01-14 LAB — CYTOLOGY - NON PAP

## 2020-01-14 LAB — SURGICAL PATHOLOGY

## 2020-01-20 NOTE — Progress Notes (Signed)
[place]   Telephone:([phone]) [phone] Fax:(336) [phone]   Clinic Follow up Note   Patient Care Team: Ladell Pier, MD as PCP - General (Internal Medicine)   I connected with Sarah [name] on 01/21/2020 at  2:00 PM EDT by telephone visit and verified that I am speaking with the correct person using two identifiers.  I discussed the limitations, risks, security and privacy concerns of performing an evaluation and management service by telephone and the availability of in person appointments. I also discussed with the patient that there may be a patient responsible charge related to this service. The patient expressed understanding and agreed to proceed.   Other persons participating in the visit and their role in the encounter:  None  Patient's location:  Her home  Provider's location:  My Office   CHIEF COMPLAINT:  F/u of Light chain abnormality  SUMMARY OF ONCOLOGIC HISTORY: Oncology History Overview Note  T3    Primary peritoneal carcinomatosis ([place]) (Resolved)  08/21/2014 Initial Diagnosis   Primary peritoneal carcinomatosis      CURRENT THERAPY:  Observation   INTERVAL HISTORY:  Sarah [name] is here for a follow up of Light chain abnormality. She notes she was not told the results of her lung biopsy yet. She is willing to proceed with bone marrow biopsy.     REVIEW OF SYSTEMS:   Constitutional: Denies fevers, chills or abnormal weight loss Eyes: Denies blurriness of vision Ears, nose, mouth, throat, and face: Denies mucositis or sore throat Respiratory: Denies cough, dyspnea or wheezes Cardiovascular: Denies palpitation, chest discomfort or lower extremity swelling Gastrointestinal:  Denies nausea, heartburn or change in bowel habits Skin: Denies abnormal skin rashes Lymphatics: Denies new lymphadenopathy or easy bruising Neurological:Denies numbness, tingling or new weaknesses Behavioral/Psych: Mood is stable, no new changes  All other  systems were reviewed with the patient and are negative.  MEDICAL HISTORY:  Past Medical History:  Diagnosis Date  . Arthritis   . Diabetes mellitus without complication ([place])   . Family history of adverse reaction to anesthesia    sister-n/v  . Family history of colon cancer   . GERD (gastroesophageal reflux disease)   . Glaucoma 02/16  . Glucagonoma 07/28/14   Pt denies this but states she has glaucoma  . History of chemotherapy   . History of hiatal hernia   . Hypertension   . Imbalance   . Neuropathy    feet bilat  . Nocturia   . Peritoneal carcinomatosis ([place])    carcinoma of ovary   . Shortness of breath dyspnea    hx of 2013 - no problems currently   . Sleep apnea   . Thyroid nodule    history of   . Wears glasses     SURGICAL HISTORY: Past Surgical History:  Procedure Laterality Date  . ABDOMINAL HYSTERECTOMY    . FOOT SURGERY  04/2018   [facility]  . INCISIONAL HERNIA REPAIR N/A 07/06/2015   Procedure: INCISIONAL HERNIA REPAIR ;  Surgeon: Rolm Bookbinder, MD;  Location: [facility];  Service: General;  Laterality: N/A;  . INSERTION OF MESH N/A 07/06/2015   Procedure: WITH INSERTION OF PHASIX ST MESH;  Surgeon: Rolm Bookbinder, MD;  Location: [facility];  Service: General;  Laterality: N/A;  . LAPAROTOMY N/A 07/06/2015   Procedure: EXPLORATORY LAPAROTOMY;  Surgeon: Everitt Amber, MD;  Location: [facility];  Service: Gynecology;  Laterality: N/A;  . LYSIS OF ADHESION N/A 07/06/2015   Procedure:  LYSIS OF ADHESION  RESECTION OF MESENTERIC MASS BOWEL RESECTION ;  Surgeon: Everitt Amber, MD;  Location: [facility];  Service: Gynecology;  Laterality: N/A;  . ROBOTIC ASSISTED TOTAL HYSTERECTOMY WITH BILATERAL SALPINGO OOPHERECTOMY Bilateral 07/28/2014   Procedure: ROBOTIC ASSISTED lysis of adhesions with biopsies, converted to LAPAROTOMY, bilateral salpingoorphorectomy, omentectomy,appendectomy;  Surgeon: Janie Morning, MD;  Location: [facility];  Service: Gynecology;  Laterality: Bilateral;    . THYROIDECTOMY, PARTIAL    . VIDEO BRONCHOSCOPY N/A 01/13/2020   Procedure: VIDEO BRONCHOSCOPY WITH BIOPSIES;  Surgeon: Melrose Nakayama, MD;  Location: [facility];  Service: Thoracic;  Laterality: N/A;    I have reviewed the social history and family history with the patient and they are unchanged from previous note.  ALLERGIES:  is allergic to [facility] [aprepitant] and lisinopril.  MEDICATIONS:  Current Outpatient Medications  Medication Sig Dispense Refill  . albuterol (VENTOLIN HFA) 108 (90 Base) MCG/ACT inhaler Inhale 2 puffs into the lungs every 6 (six) hours as needed for wheezing or shortness of breath. 8 g 0  . amLODipine (NORVASC) 10 MG tablet Take 1 tablet (10 mg total) by mouth daily. 90 tablet 3  . Blood Glucose Monitoring Suppl (ACCU-CHEK AVIVA PLUS) w/Device KIT USE AS DIRECTED 1 kit 0  . Blood Glucose Monitoring Suppl (ACCU-CHEK AVIVA) device Use as instructed 1 each 0  . Cholecalciferol (VITAMIN D3) 10 MCG (400 UNIT) tablet Take 400 Units by mouth once a week.    . diclofenac sodium (VOLTAREN) 1 % GEL Apply 2 g topically 4 (four) times daily. (Patient taking differently: Apply 2 g topically 4 (four) times daily as needed (back pain.). ) 100 g 3  . gabapentin (NEURONTIN) 300 MG capsule T6AKE 1 CAPSULE BY MOUTH EVERY MORNING AND 1 CAPSULE AT NOON, AND 2 CAPSULES AT BEDTIME (Patient taking differently: Take 300-600 mg by mouth See admin instructions. Take 1 capsule (300 mg) by mouth every morning, take 1 capsule (300 mg) by mouth at noon, & take 2 tablets (600 mg) by mouth at bedtime.) 360 capsule 3  . glimepiride (AMARYL) 4 MG tablet Take 1 tablet (4 mg total) by mouth daily with breakfast. 90 tablet 3  . glucose blood (ACCU-CHEK AVIVA PLUS) test strip Use as instructed 100 each 12  . hydrALAZINE (APRESOLINE) 25 MG tablet Take 1.5 tablets (37.5 mg total) by mouth 3 (three) times daily. 405 tablet 3  . hydrochlorothiazide (HYDRODIURIL) 25 MG tablet Take 0.5 tablets (12.5 mg total)  by mouth daily. 90 tablet 3  . insulin glargine (LANTUS SOLOSTAR) 100 UNIT/ML Solostar Pen Inject 30 units daily. Increase by 2 units every 3 days until morning sugars are < 130. Max = 40 units. (Patient taking differently: Inject 50 Units into the skin at bedtime. ) 15 mL 2  . Insulin Pen Needle 29G X 12MM MISC Use as directed. 100 each 6  . Lancets (ACCU-CHEK MULTICLIX) lancets   0  . Lancets (ACCU-CHEK SOFT TOUCH) lancets Use as instructed 100 each 12  . Lancets Misc. (ACCU-CHEK SOFTCLIX LANCET DEV) KIT Use as directed 1 kit 0  . losartan (COZAAR) 50 MG tablet Take 1 tablet (50 mg total) by mouth daily. 90 tablet 3  . meloxicam (MOBIC) 15 MG tablet TAKE 1 TABLET EVERY DAY 90 tablet 1  . metFORMIN (GLUCOPHAGE) 1000 MG tablet Take 1,000 mg by mouth 2 (two) times daily.    . metoprolol tartrate (LOPRESSOR) 25 MG tablet TAKE 1/2 TABLET BY MOUTH 2 TIMES A DAY (Patient taking differently: Take 12.5 mg by  mouth 2 (two) times daily. ) 90 tablet 3  . rosuvastatin (CRESTOR) 20 MG tablet Take 1 tablet (20 mg total) by mouth daily. Stop Atorvastatin 90 tablet 3  . timolol (BETIMOL) 0.5 % ophthalmic solution Place 1 drop into both eyes daily.    . Travoprost, BAK Free, (TRAVATAN) 0.004 % SOLN ophthalmic solution Place 1 drop into both eyes at bedtime.    . Vitamin D, Ergocalciferol, (DRISDOL) 1.25 MG (50000 UNIT) CAPS capsule TAKE 1 CAPSULE EVERY 7 DAYS (Patient not taking: Reported on 12/29/2019) 12 capsule 0   No current facility-administered medications for this visit.    PHYSICAL EXAMINATION: ECOG PERFORMANCE STATUS: 0 - Asymptomatic  No vitals taken today, Exam not performed today   LABORATORY DATA:  I have reviewed the data as listed CBC Latest Ref Rng & Units 01/11/2020 12/29/2019 10/06/2019  WBC 4.0 - 10.5 K/uL 6.8 7.1 6.1  Hemoglobin 12.0 - 15.0 g/dL 14.3 13.8 13.5  Hematocrit 36 - 46 % 44.6 42.6 41.5  Platelets 150 - 400 K/uL 319 295 268     CMP Latest Ref Rng & Units 01/11/2020 12/29/2019  10/06/2019  Glucose 70 - 99 mg/dL 168(H) 159(H) 316(H)  BUN 6 - 20 mg/dL 17 21(H) 17  Creatinine 0.44 - 1.00 mg/dL 1.10(H) 1.26(H) 1.31(H)  Sodium 135 - 145 mmol/L 137 141 141  Potassium 3.5 - 5.1 mmol/L 3.9 3.9 4.0  Chloride 98 - 111 mmol/L 101 104 102  CO2 22 - 32 mmol/L 24 25 25   Calcium 8.9 - 10.3 mg/dL 10.7(H) 11.7(H) 10.9(H)  Total Protein 6.5 - 8.1 g/dL 7.7 8.3(H) 7.7  Total Bilirubin 0.3 - 1.2 mg/dL 1.0 0.6 0.7  Alkaline Phos 38 - 126 U/L 53 60 58  AST 15 - 41 U/L 35 29 33  ALT 0 - 44 U/L 36 33 32    PET 10/26/19 IMPRESSION: 1. 11 mm central right lower lobe pulmonary nodule shows no hypermetabolism. While this is reassuring, well differentiated or low-grade neoplasm can be poorly FDG avid and close continued follow-up recommended. 2. Interval development of a 14 mm ill-defined nodular opacity in the posterior left lower lobe over the 9 day interval since prior CT. This shows low level FDG uptake and is almost assuredly atelectatic or infectious/inflammatory etiology. 3. Stable 1.7 cm right thyroid nodule without hypermetabolic FDG uptake on PET imaging. This has been documented on prior studies including thyroid ultrasound of 10/22/2019 (ref: J Am Coll Radiol. 2015 Feb;12(2): 143-50). 4. No findings on today's study to suggest hypermetabolic metastatic disease in the chest, abdomen, or pelvis. 5. Aortic Atherosclerois (ICD10-170.0) 6. Hepatic steatosis.   PET 12/08/19  IMPRESSION: 1. Very low radiotracer activity associated with the RIGHT lobe pulmonary nodule is nonspecific. Findings would not be consistent with metastatic well-differentiated carcinoid tumor. Primary bronchial carcinoid can have low somatostatin receptor activity. Overall favor benign/indolent lesion. 2. No evidence of well differentiated neuroendocrine tumor within the abdomen pelvis.   VIDEO BRONCHOSCOPY WITH BIOPSIES by Dr Roxan Hockey 01/13/20  FINAL MICROSCOPIC DIAGNOSIS:   A. LUNG, RIGHT  LOWER ENDOBRONCHIAL, BIOPSY:  - Benign bronchial tissue.    RADIOGRAPHIC STUDIES: I have personally reviewed the radiological images as listed and agreed with the findings in the report. No results found.   ASSESSMENT & PLAN:  Sarah [name] is a 54 y.o. female with    1. Hypercalcemia, elevated serum kappa free light chains, likely MGUS, rule out MM  -She presented initially with asymptomatic hypercalcemia, work up showed normal PTH (on low  end normal range), normal TSH, and normal D 1,25 (OH)2 and low VitD 25(OH)2, rpPTH was not elevated  -her recent PET scan was negative for ovarian cancer recurrence, but she has a 75m right lung nodule with low uptake level on PET -SPEP showed Ig Kappa free light chain elevation to 40.2, and increased kappa/lambda ratio; M spike was not detected. -We discussed her Lung biopsy from 01/13/20 which was benign. I recommend she proceed with Bone marrow biopsy to rule out multiple myeloma as possible cause of her hypercalcemia. She is agreeable.  -F/u after BM biopsy    2. Lung Nodules -She was seen to have 469mright lung nodule on 01/12/15 scan. This did increase to 1119mn 10/13/19 CT scan.  -Her PET scan from 10/26/19 12m24mght lung nodule with no hypermetabolic uptake, a 14mm77AJt lung nodule with low uptake and 1.7cm right thyroid nodule without hypermetabolic uptake.  -Her repeated PET from 12/08/19 was also indeterminate.  -Dr RossDenman Georges not feel this is related to her prior ovarian cancer.   -Her video bronchoscopy with Dr HendRoxan Hockey8/5/21 was benign. She can f/u with Dr HendRoxan Hockey likely repeat scans in the future.   3. 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  -Ca 125 was elevated at diagnosis, normalized after surgery and chemo -last CT 11/2018 showed NED. She will continue to f/u with Dr RossDenman Georgeshe is reportedly UTD  on age-appropriate cancer screenings. She is being followed for a likely benign right breast mass which was stable in 01/2019 on mammogram/US  4. Vitamin D deficiency -found on work up in 05/2019 -Continue Vitd supplement   5. HTN, HL, DM -Continue weight loss program, f/u with dietician and PCP  6. Insomnia  -This has been chronic for her. She can fall asleep initially but has trouble staying asleep or falling back asleep.  -I discussed trying OTC Melatonin or benadryl. If still not enough she can speak with her PCP about prescription medications but this can lead to dependence with regular use. She voiced good understanding.    PLAN: -I sent a message to Dr HendRoxan Hockeyone Marrow biopsy in 1-2 weeks With NP LindMendel Ryderwith IR.  -f/u after BM biopsy    No problem-specific Assessment & Plan notes found for this encounter.   No orders of the defined types were placed in this encounter.  I discussed the assessment and treatment plan with the patient. The patient was provided an opportunity to ask questions and all were answered. The patient agreed with the plan and demonstrated an understanding of the instructions.  The patient was advised to call back or seek an in-person evaluation if the symptoms worsen or if the condition fails to improve as anticipated.  The total time spent in the appointment was 15 minutes.    Sarah [name] 01/21/2020   I, AmoyJoslyn Devon acting as scribe for Allanna Bresee Truitt [name].   I have reviewed the above documentation for accuracy and completeness, and I agree with the above.

## 2020-01-21 ENCOUNTER — Encounter: Payer: Self-pay | Admitting: Hematology

## 2020-01-21 ENCOUNTER — Inpatient Hospital Stay: Payer: Medicare HMO | Attending: Nurse Practitioner | Admitting: Hematology

## 2020-01-21 DIAGNOSIS — I10 Essential (primary) hypertension: Secondary | ICD-10-CM | POA: Insufficient documentation

## 2020-01-21 DIAGNOSIS — R768 Other specified abnormal immunological findings in serum: Secondary | ICD-10-CM | POA: Diagnosis not present

## 2020-01-21 DIAGNOSIS — D8989 Other specified disorders involving the immune mechanism, not elsewhere classified: Secondary | ICD-10-CM | POA: Insufficient documentation

## 2020-01-21 DIAGNOSIS — Z79899 Other long term (current) drug therapy: Secondary | ICD-10-CM | POA: Insufficient documentation

## 2020-01-21 DIAGNOSIS — I700 Atherosclerosis of aorta: Secondary | ICD-10-CM | POA: Insufficient documentation

## 2020-01-21 DIAGNOSIS — K760 Fatty (change of) liver, not elsewhere classified: Secondary | ICD-10-CM | POA: Insufficient documentation

## 2020-01-21 DIAGNOSIS — Z9221 Personal history of antineoplastic chemotherapy: Secondary | ICD-10-CM | POA: Insufficient documentation

## 2020-01-21 DIAGNOSIS — C561 Malignant neoplasm of right ovary: Secondary | ICD-10-CM | POA: Insufficient documentation

## 2020-01-21 DIAGNOSIS — Z800 Family history of malignant neoplasm of digestive organs: Secondary | ICD-10-CM | POA: Insufficient documentation

## 2020-01-21 DIAGNOSIS — Z9889 Other specified postprocedural states: Secondary | ICD-10-CM | POA: Insufficient documentation

## 2020-01-24 ENCOUNTER — Encounter: Payer: Self-pay | Admitting: Internal Medicine

## 2020-01-24 ENCOUNTER — Ambulatory Visit: Payer: Medicare HMO | Attending: Internal Medicine | Admitting: Internal Medicine

## 2020-01-24 ENCOUNTER — Encounter: Payer: Self-pay | Admitting: Pharmacist

## 2020-01-24 ENCOUNTER — Ambulatory Visit (HOSPITAL_BASED_OUTPATIENT_CLINIC_OR_DEPARTMENT_OTHER): Payer: Medicare HMO | Admitting: Pharmacist

## 2020-01-24 ENCOUNTER — Other Ambulatory Visit: Payer: Self-pay

## 2020-01-24 VITALS — BP 131/85 | HR 73 | Temp 98.4°F | Resp 16 | Wt 232.4 lb

## 2020-01-24 DIAGNOSIS — R682 Dry mouth, unspecified: Secondary | ICD-10-CM

## 2020-01-24 DIAGNOSIS — I10 Essential (primary) hypertension: Secondary | ICD-10-CM

## 2020-01-24 DIAGNOSIS — E1165 Type 2 diabetes mellitus with hyperglycemia: Secondary | ICD-10-CM

## 2020-01-24 DIAGNOSIS — R911 Solitary pulmonary nodule: Secondary | ICD-10-CM | POA: Diagnosis not present

## 2020-01-24 DIAGNOSIS — E1142 Type 2 diabetes mellitus with diabetic polyneuropathy: Secondary | ICD-10-CM | POA: Diagnosis not present

## 2020-01-24 DIAGNOSIS — Z6841 Body Mass Index (BMI) 40.0 and over, adult: Secondary | ICD-10-CM

## 2020-01-24 DIAGNOSIS — IMO0002 Reserved for concepts with insufficient information to code with codable children: Secondary | ICD-10-CM

## 2020-01-24 DIAGNOSIS — D8989 Other specified disorders involving the immune mechanism, not elsewhere classified: Secondary | ICD-10-CM | POA: Diagnosis not present

## 2020-01-24 DIAGNOSIS — G4709 Other insomnia: Secondary | ICD-10-CM | POA: Diagnosis not present

## 2020-01-24 DIAGNOSIS — Z79899 Other long term (current) drug therapy: Secondary | ICD-10-CM

## 2020-01-24 LAB — GLUCOSE, POCT (MANUAL RESULT ENTRY): POC Glucose: 225 mg/dl — AB (ref 70–99)

## 2020-01-24 MED ORDER — TRULICITY 0.75 MG/0.5ML ~~LOC~~ SOAJ: 0.7500 mg | SUBCUTANEOUS | 3 refills | Status: DC

## 2020-01-24 MED ORDER — GABAPENTIN 600 MG PO TABS: 600.0000 mg | ORAL_TABLET | Freq: Three times a day (TID) | ORAL | 3 refills | Status: DC

## 2020-01-24 NOTE — Progress Notes (Signed)
Patient ID: Sarah [name], female    DOB: 11/27/1965  MRN: [id number]  CC: Diabetes and Hypertension   Subjective: Sarah [name] is a 54 y.o. female who presents for chronic ds management. Last eval 10/2019 Her concerns today include:  DM with peripheral neuropathy(more so due to chemo), HTN, HL, OSA, Vit D def, midline LBP,stage IIIc low-grade serous carcinoma of the ovaryS/p BSO, omentectomy, appendectomy on 07/28/14. (S/p adjuvant chemotherapy with 6 cycles of paclitaxel and carboplatin completed on 12/08/14. S/p an ex lap, hernia repair and small bowel resection for a presumed recurrence (benign pathology), Hypercalcemia with elevated free light chains/ likely MGUS (followed by [name]), 1.1 cm RLL nodule (bx negative 01/2020), RT thyroid nodule (does not met criteria for bx 10/2019)   Hypercalcemia: Since last visit with me, she saw Dr. [name].  Her work-up showed no recurrence of ovarian cancer.  She did have increase in size of right lower lobe lung nodule.  PET scan revealed that this nodule had no hypermetabolic activity.  Also showed left lower lobe stable 1.7 cm right thyroid nodule.  Case was discussed at tumor board.  Referral made to cardiovascular surgeon Dr. Roxan Hockey who did bx of RLL via bronchoscopy.  Pathology revealed benign tissue.  She saw [name] in follow-up for light chain abnormality thought likely to be due to MGUS.  Since lung biopsy was negative.  She recommended that we proceed with bone marrow biopsy to rule out multiple myeloma.  Patient has not had this done as yet.  DIABETES TYPE 2/Obesity Last A1C:   Results for orders placed or performed in visit on 01/24/20  POCT glucose (manual entry)  Result Value Ref Range   POC Glucose 225 (A) 70 - 99 mg/dl    Lab Results  Component Value Date   HGBA1C 9.0 (H) 01/11/2020   Med Adherence:  [x]  Yes.  She is on 50 units of Lantus, Metformin and Amaryl.    Medication side effects:  []  Yes    [x]  No Home Monitoring?   [x]  Yes  2-3 x day.  Does not have log with her Home glucose results range: morning range 130-181.  Diet Adherence: [x]  Yes. Uses smaller plates; eating more greens.  No sugary drinks. Gain 8 lbs since 05/2019. "It is depressing."   Saw nutritionist in past.  Exercise: []  Yes    [x]  No.  She has not been walking because her feet are numb and hurt all the time from the neuropathy.   Hypoglycemic episodes?: []  Yes    [x]  No Numbness of the feet? [x]  Yes  Taking Gabapentin 300 mg in a.m and noon and 600 mg at nights.   Retinopathy hx? []  Yes    []  No Last eye exam: will see [facility] on 23rd of this mth for f/u on glaucoma.  Saw [facility] 1-2 wks ago and had eye exam.  Got new glasses Comments:   Insomnia:  Having problems with falling and staying asleep for months.  She turns off everything including her phone.  She gets in bed "whenever I'm sleepy."  Sometimes at 8 p.m, other times at 11 p.m.  When she falls asleep she finds that she is back up around 3 AM and unable to fall asleep again. -mouth dry at nights so she gets up 3-4 x to drink water. -also was worried about lung nodule and feels this may have played a role.  HYPERTENSION Currently taking: see medication list  Med Adherence: [x]  Yes -she is on metoprolol, amlodipine, HCTZ, hydralazine and losartan Medication side effects: []  Yes    [x]  No Adherence with salt restriction: []  Yes    []  No Home Monitoring?: [x]  Yes    []  No Monitoring Frequency: once a wk Home BP results range: last wk was 120s/70s SOB? []  Yes    [x]  No Chest Pain?: []  Yes    [x]  No Leg swelling?: []  Yes    [x]  No Headaches?: []  Yes    [x]  No Dizziness? []  Yes    [x]  No Comments:     Patient Active Problem List   Diagnosis Date Noted  . Elevated serum immunoglobulin free light chains 01/21/2020  . 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[place]) 01/05/2020  . Thyroid nodule 10/14/2019  . Renal insufficiency 06/24/2019  . Hypercalcemia 06/24/2019  .  Hiatal hernia with GERD without esophagitis 06/18/2017  . Need for influenza vaccination 02/17/2017  . Ventral hernia 01/24/2016  . Type 2 diabetes mellitus without complication, without long-term current use of insulin ([place]) 10/04/2015  . Obesity (BMI 30-39.9) 10/04/2015  . Dyslipidemia 07/27/2015  . Neuropathic pain of both legs 07/27/2015  . Obesity (BMI 30.0-34.9) 12/24/2014  . Genetic testing 11/01/2014  . Constipation - functional 09/24/2014  . Chemotherapy-induced peripheral neuropathy ([place]) 09/24/2014  . Midline low back pain without sciatica 09/24/2014  . Family history of colon cancer   . Ovarian cancer, right ([place]) 07/08/2014  . Hilar adenopathy   . Essential hypertension   . Right lower lobe pulmonary nodule      Current Outpatient Medications on File Prior to Visit  Medication Sig Dispense Refill  . albuterol (VENTOLIN HFA) 108 (90 Base) MCG/ACT inhaler Inhale 2 puffs into the lungs every 6 (six) hours as needed for wheezing or shortness of breath. 8 g 0  . amLODipine (NORVASC) 10 MG tablet Take 1 tablet (10 mg total) by mouth daily. 90 tablet 3  . Blood Glucose Monitoring Suppl (ACCU-CHEK AVIVA PLUS) w/Device KIT USE AS DIRECTED 1 kit 0  . Blood Glucose Monitoring Suppl (ACCU-CHEK AVIVA) device Use as instructed 1 each 0  . Cholecalciferol (VITAMIN D3) 10 MCG (400 UNIT) tablet Take 400 Units by mouth once a week.    . diclofenac sodium (VOLTAREN) 1 % GEL Apply 2 g topically 4 (four) times daily. (Patient taking differently: Apply 2 g topically 4 (four) times daily as needed (back pain.). ) 100 g 3  . gabapentin (NEURONTIN) 300 MG capsule T6AKE 1 CAPSULE BY MOUTH EVERY MORNING AND 1 CAPSULE AT NOON, AND 2 CAPSULES AT BEDTIME (Patient taking differently: Take 300-600 mg by mouth See admin instructions. Take 1 capsule (300 mg) by mouth every morning, take 1 capsule (300 mg) by mouth at noon, & take 2 tablets (600 mg) by mouth at bedtime.) 360 capsule 3  . glimepiride (AMARYL) 4  MG tablet Take 1 tablet (4 mg total) by mouth daily with breakfast. 90 tablet 3  . glucose blood (ACCU-CHEK AVIVA PLUS) test strip Use as instructed 100 each 12  . hydrALAZINE (APRESOLINE) 25 MG tablet Take 1.5 tablets (37.5 mg total) by mouth 3 (three) times daily. 405 tablet 3  . hydrochlorothiazide (HYDRODIURIL) 25 MG tablet Take 0.5 tablets (12.5 mg total) by mouth daily. 90 tablet 3  . insulin glargine (LANTUS SOLOSTAR) 100 UNIT/ML Solostar Pen Inject 30 units daily. Increase by 2 units every 3 days until morning sugars are < 130. Max = 40 units. (Patient taking differently: Inject 50 Units into  the skin at bedtime. ) 15 mL 2  . Insulin Pen Needle 29G X 12MM MISC Use as directed. 100 each 6  . Lancets (ACCU-CHEK MULTICLIX) lancets   0  . Lancets (ACCU-CHEK SOFT TOUCH) lancets Use as instructed 100 each 12  . Lancets Misc. (ACCU-CHEK SOFTCLIX LANCET DEV) KIT Use as directed 1 kit 0  . losartan (COZAAR) 50 MG tablet Take 1 tablet (50 mg total) by mouth daily. 90 tablet 3  . meloxicam (MOBIC) 15 MG tablet TAKE 1 TABLET EVERY DAY 90 tablet 1  . metFORMIN (GLUCOPHAGE) 1000 MG tablet Take 1,000 mg by mouth 2 (two) times daily.    . metoprolol tartrate (LOPRESSOR) 25 MG tablet TAKE 1/2 TABLET BY MOUTH 2 TIMES A DAY (Patient taking differently: Take 12.5 mg by mouth 2 (two) times daily. ) 90 tablet 3  . rosuvastatin (CRESTOR) 20 MG tablet Take 1 tablet (20 mg total) by mouth daily. Stop Atorvastatin 90 tablet 3  . timolol (BETIMOL) 0.5 % ophthalmic solution Place 1 drop into both eyes daily.    . Travoprost, BAK Free, (TRAVATAN) 0.004 % SOLN ophthalmic solution Place 1 drop into both eyes at bedtime.    . Vitamin D, Ergocalciferol, (DRISDOL) 1.25 MG (50000 UNIT) CAPS capsule TAKE 1 CAPSULE EVERY 7 DAYS (Patient not taking: Reported on 12/29/2019) 12 capsule 0  . [DISCONTINUED] blood glucose meter kit and supplies Dispense based on patient and insurance preference. Use up to four times daily as directed.  (FOR ICD-9 250.00, 250.01). 1 each 0   No current facility-administered medications on file prior to visit.    Allergies  Allergen Reactions  . Emend [Aprepitant] Other (See Comments)    Urticaria   . Lisinopril Cough    Social History   Socioeconomic History  . Marital status: Married    Spouse name: Not on file  . Number of children: Not on file  . Years of education: Not on file  . Highest education level: Not on file  Occupational History  . Not on file  Tobacco Use  . Smoking status: Never Smoker  . Smokeless tobacco: Never Used  Vaping Use  . Vaping Use: Never used  Substance and Sexual Activity  . Alcohol use: No  . Drug use: No  . Sexual activity: Not on file  Other Topics Concern  . Not on file  Social History Narrative  . Not on file   Social Determinants of Health   Financial Resource Strain:   . Difficulty of Paying Living Expenses:   Food Insecurity:   . Worried About Charity fundraiser in the Last Year:   . Arboriculturist in the Last Year:   Transportation Needs:   . Film/video editor (Medical):   Marland Kitchen Lack of Transportation (Non-Medical):   Physical Activity:   . Days of Exercise per Week:   . Minutes of Exercise per Session:   Stress:   . Feeling of Stress :   Social Connections:   . Frequency of Communication with Friends and Family:   . Frequency of Social Gatherings with Friends and Family:   . Attends Religious Services:   . Active Member of Clubs or Organizations:   . Attends Archivist Meetings:   Marland Kitchen Marital Status:   Intimate Partner Violence:   . Fear of Current or Ex-Partner:   . Emotionally Abused:   Marland Kitchen Physically Abused:   . Sexually Abused:     Family History  Problem Relation  Age of Onset  . Hypertension Mother   . Hypertension Father   . Diabetes Father   . Cancer Sister 50       fibrosarcoma (back); currently 37  . Prostate cancer Maternal Uncle   . Colon cancer Paternal Aunt        Dx 57s; deceased  72s  . Prostate cancer Paternal Uncle        currently 24  . Cancer Paternal Uncle 54       "bone" ; unk. primary  . Stomach cancer Paternal Uncle     Past Surgical History:  Procedure Laterality Date  . ABDOMINAL HYSTERECTOMY    . FOOT SURGERY  04/2018   [facility]  . INCISIONAL HERNIA REPAIR N/A 07/06/2015   Procedure: INCISIONAL HERNIA REPAIR ;  Surgeon: Rolm Bookbinder, MD;  Location: [facility];  Service: General;  Laterality: N/A;  . INSERTION OF MESH N/A 07/06/2015   Procedure: WITH INSERTION OF PHASIX ST MESH;  Surgeon: Rolm Bookbinder, MD;  Location: [facility];  Service: General;  Laterality: N/A;  . LAPAROTOMY N/A 07/06/2015   Procedure: EXPLORATORY LAPAROTOMY;  Surgeon: Everitt Amber, MD;  Location: [facility];  Service: Gynecology;  Laterality: N/A;  . LYSIS OF ADHESION N/A 07/06/2015   Procedure:  LYSIS OF ADHESION RESECTION OF MESENTERIC MASS BOWEL RESECTION ;  Surgeon: Everitt Amber, MD;  Location: [facility];  Service: Gynecology;  Laterality: N/A;  . ROBOTIC ASSISTED TOTAL HYSTERECTOMY WITH BILATERAL SALPINGO OOPHERECTOMY Bilateral 07/28/2014   Procedure: ROBOTIC ASSISTED lysis of adhesions with biopsies, converted to LAPAROTOMY, bilateral salpingoorphorectomy, omentectomy,appendectomy;  Surgeon: Janie Morning, MD;  Location: [facility];  Service: Gynecology;  Laterality: Bilateral;  . THYROIDECTOMY, PARTIAL    . VIDEO BRONCHOSCOPY N/A 01/13/2020   Procedure: VIDEO BRONCHOSCOPY WITH BIOPSIES;  Surgeon: Melrose Nakayama, MD;  Location: [facility];  Service: Thoracic;  Laterality: N/A;    ROS: Review of Systems Negative except as stated above  PHYSICAL EXAM: BP 131/85   Pulse 73   Temp 98.4 F (36.9 C)   Resp 16   Wt 232 lb 6.4 oz (105.4 kg)   LMP  (LMP Unknown)   SpO2 94%   BMI 45.39 kg/m   Wt Readings from Last 3 Encounters:  01/24/20 232 lb 6.4 oz (105.4 kg)  01/13/20 234 lb 2.1 oz (106.2 kg)  01/11/20 234 lb 3.2 oz (106.2 kg)    Physical Exam  General appearance - alert,  well appearing, and in no distress Mental status - normal mood, behavior, speech, dress, motor activity, and thought processes Mouth - mucous membranes moist, pharynx normal without lesions Neck - supple, no significant adenopathy Chest - clear to auscultation, no wheezes, rales or rhonchi, symmetric air entry Heart - normal rate, regular rhythm, normal S1, S2, no murmurs, rubs, clicks or gallops Extremities - peripheral pulses normal, no pedal edema, no clubbing or cyanosis Diabetic Foot Exam - Simple   Simple Foot Form Visual Inspection No deformities, no ulcerations, no other skin breakdown bilaterally: Yes Sensation Testing See comments: Yes Pulse Check Posterior Tibialis and Dorsalis pulse intact bilaterally: Yes Comments Some decreased sensation on the plantar surface of both feet.      CMP Latest Ref Rng & Units 01/11/2020 12/29/2019 10/06/2019  Glucose 70 - 99 mg/dL 168(H) 159(H) 316(H)  BUN 6 - 20 mg/dL 17 21(H) 17  Creatinine 0.44 - 1.00 mg/dL 1.10(H) 1.26(H) 1.31(H)  Sodium 135 - 145 mmol/L 137 141 141  Potassium 3.5 - 5.1 mmol/L 3.9 3.9  4.0  Chloride 98 - 111 mmol/L 101 104 102  CO2 22 - 32 mmol/L 24 25 25   Calcium 8.9 - 10.3 mg/dL 10.7(H) 11.7(H) 10.9(H)  Total Protein 6.5 - 8.1 g/dL 7.7 8.3(H) 7.7  Total Bilirubin 0.3 - 1.2 mg/dL 1.0 0.6 0.7  Alkaline Phos 38 - 126 U/L 53 60 58  AST 15 - 41 U/L 35 29 33  ALT 0 - 44 U/L 36 33 32   Lipid Panel     Component Value Date/Time   CHOL 166 12/04/2018 0914   TRIG 222 (H) 12/04/2018 0914   HDL 42 12/04/2018 0914   CHOLHDL 4.0 12/04/2018 0914   CHOLHDL 5.1 (H) 08/14/2016 1729   VLDL 74 (H) 08/14/2016 1729   LDLCALC 80 12/04/2018 0914    CBC    Component Value Date/Time   WBC 6.8 01/11/2020 1017   RBC 4.72 01/11/2020 1017   HGB 14.3 01/11/2020 1017   HGB 13.8 12/29/2019 1015   HGB 13.6 12/04/2018 0914   HGB 12.9 04/22/2016 0750   HCT 44.6 01/11/2020 1017   HCT 41.0 12/04/2018 0914   HCT 39.0 04/22/2016 0750    PLT 319 01/11/2020 1017   PLT 295 12/29/2019 1015   PLT 332 12/04/2018 0914   MCV 94.5 01/11/2020 1017   MCV 88 12/04/2018 0914   MCV 92.2 04/22/2016 0750   MCH 30.3 01/11/2020 1017   MCHC 32.1 01/11/2020 1017   RDW 13.6 01/11/2020 1017   RDW 13.6 12/04/2018 0914   RDW 13.9 04/22/2016 0750   LYMPHSABS 3.2 12/29/2019 1015   LYMPHSABS 2.7 04/22/2016 0750   MONOABS 0.7 12/29/2019 1015   MONOABS 1.0 (H) 04/22/2016 0750   EOSABS 0.1 12/29/2019 1015   EOSABS 0.1 04/22/2016 0750   BASOSABS 0.0 12/29/2019 1015   BASOSABS 0.0 04/22/2016 0750    ASSESSMENT AND PLAN: 1. Uncontrolled type 2 diabetes mellitus with peripheral neuropathy ([place]) Encouraged her to continue healthy eating habits.  We will try to get symptoms of her neuropathy better control so that she is able to move more.  We will increase the gabapentin to 600 mg 3 times a day. -We discussed adding Trulicity which will help better control her diabetes and also bring about some weight loss.  I went over how the medication works and possible side effects including vomiting and abdominal pain, pancreatitis.  She is interested in giving the medication a try.  She has not had any symptoms of gastroparesis.  Clinical pharmacist to do teaching on administration. - POCT glucose (manual entry) - Microalbumin / creatinine urine ratio - Dulaglutide (TRULICITY) 0.62 BJ/6.2GB SOPN; Inject 0.5 mLs (0.75 mg total) into the skin once a week.  Dispense: 6 mL; Refill: 3 - gabapentin (NEURONTIN) 600 MG tablet; Take 1 tablet (600 mg total) by mouth 3 (three) times daily.  Dispense: 270 tablet; Refill: 3  2. Essential hypertension Not at goal but close.  Reported home blood pressure reading was good so I have made no changes in her medicines today.  3. Kappa light chain disease ([place]) Being worked up by hematology.  Plan is for bone marrow biopsy sometime in the near future  4. Class 3 severe obesity due to excess calories with serious comorbidity and  body mass index (BMI) of 45.0 to 49.9 in adult [facility]) See #1 above.  Patient is interested in going to medical weight management. - Amb Ref to Medical Weight Management  5. Other insomnia Good sleep hygiene discussed and encouraged.  Advised her  to get in bed around about the same time every night, turn off all lights and sounds once in bed.  If unable to fall asleep within 45 minutes she should get up and do something until her eyes feel heavy.  Avoid caffeinated beverages within 4 to 5 hours of bedtime.  Avoid heavy alcohol consumption before bedtime.  Advised to try melatonin 3 mg over-the-counter at bedtime.  6. Dry mouth She will try Biotene mouthwash.  Med list reviewed today  7. Nodule of lower lobe of right lung Biopsy was negative    Patient was given the opportunity to ask questions.  Patient verbalized understanding of the plan and was able to repeat key elements of the plan.   Orders Placed This Encounter  Procedures  . Microalbumin / creatinine urine ratio  . POCT glucose (manual entry)     Requested Prescriptions    No prescriptions requested or ordered in this encounter    No follow-ups on file.  Karle Plumber, MD, FACP

## 2020-01-24 NOTE — Progress Notes (Signed)
Patient was educated on the use of the Trulicity pen. Reviewed necessary supplies and operation of the pen. Also reviewed goal blood glucose levels. Patient was able to demonstrate use. All questions and concerns were addressed.  Luke Van Ausdall, PharmD, CPP Clinical Pharmacist [facility] [phone]   

## 2020-01-24 NOTE — Patient Instructions (Addendum)
Purchase melatonin 3 mg over-the-counter and take 1 at bedtime.  We have added a diabetic medication called Trulicity.  It will help better control your blood sugars and also will help with weight loss.  If you experience any nausea or vomiting or abdominal pain as discussed, please stop the medication and follow-up.  I have referred you to medical weight management.  Increase gabapentin to 600 mg 3 times a day.   Insomnia Insomnia is a sleep disorder that makes it difficult to fall asleep or stay asleep. Insomnia can cause fatigue, low energy, difficulty concentrating, mood swings, and poor performance at work or school. There are three different ways to classify insomnia:  Difficulty falling asleep.  Difficulty staying asleep.  Waking up too early in the morning. Any type of insomnia can be long-term (chronic) or short-term (acute). Both are common. Short-term insomnia usually lasts for three months or less. Chronic insomnia occurs at least three times a week for longer than three months. What are the causes? Insomnia may be caused by another condition, situation, or substance, such as:  Anxiety.  Certain medicines.  Gastroesophageal reflux disease (GERD) or other gastrointestinal conditions.  Asthma or other breathing conditions.  Restless legs syndrome, sleep apnea, or other sleep disorders.  Chronic pain.  Menopause.  Stroke.  Abuse of alcohol, tobacco, or illegal drugs.  Mental health conditions, such as depression.  Caffeine.  Neurological disorders, such as Alzheimer's disease.  An overactive thyroid (hyperthyroidism). Sometimes, the cause of insomnia may not be known. What increases the risk? Risk factors for insomnia include:  Gender. Women are affected more often than men.  Age. Insomnia is more common as you get older.  Stress.  Lack of exercise.  Irregular work schedule or working night shifts.  Traveling between different time  zones.  Certain medical and mental health conditions. What are the signs or symptoms? If you have insomnia, the main symptom is having trouble falling asleep or having trouble staying asleep. This may lead to other symptoms, such as:  Feeling fatigued or having low energy.  Feeling nervous about going to sleep.  Not feeling rested in the morning.  Having trouble concentrating.  Feeling irritable, anxious, or depressed. How is this diagnosed? This condition may be diagnosed based on:  Your symptoms and medical history. Your health care provider may ask about: ? Your sleep habits. ? Any medical conditions you have. ? Your mental health.  A physical exam. How is this treated? Treatment for insomnia depends on the cause. Treatment may focus on treating an underlying condition that is causing insomnia. Treatment may also include:  Medicines to help you sleep.  Counseling or therapy.  Lifestyle adjustments to help you sleep better. Follow these instructions at home: Eating and drinking   Limit or avoid alcohol, caffeinated beverages, and cigarettes, especially close to bedtime. These can disrupt your sleep.  Do not eat a large meal or eat spicy foods right before bedtime. This can lead to digestive discomfort that can make it hard for you to sleep. Sleep habits   Keep a sleep diary to help you and your health care provider figure out what could be causing your insomnia. Write down: ? When you sleep. ? When you wake up during the night. ? How well you sleep. ? How rested you feel the next day. ? Any side effects of medicines you are taking. ? What you eat and drink.  Make your bedroom a dark, comfortable place where it is easy to  fall asleep. ? Put up shades or blackout curtains to block light from outside. ? Use a white noise machine to block noise. ? Keep the temperature cool.  Limit screen use before bedtime. This includes: ? Watching TV. ? Using your smartphone,  tablet, or computer.  Stick to a routine that includes going to bed and waking up at the same times every day and night. This can help you fall asleep faster. Consider making a quiet activity, such as reading, part of your nighttime routine.  Try to avoid taking naps during the day so that you sleep better at night.  Get out of bed if you are still awake after 15 minutes of trying to sleep. Keep the lights down, but try reading or doing a quiet activity. When you feel sleepy, go back to bed. General instructions  Take over-the-counter and prescription medicines only as told by your health care provider.  Exercise regularly, as told by your health care provider. Avoid exercise starting several hours before bedtime.  Use relaxation techniques to manage stress. Ask your health care provider to suggest some techniques that may work well for you. These may include: ? Breathing exercises. ? Routines to release muscle tension. ? Visualizing peaceful scenes.  Make sure that you drive carefully. Avoid driving if you feel very sleepy.  Keep all follow-up visits as told by your health care provider. This is important. Contact a health care provider if:  You are tired throughout the day.  You have trouble in your daily routine due to sleepiness.  You continue to have sleep problems, or your sleep problems get worse. Get help right away if:  You have serious thoughts about hurting yourself or someone else. If you ever feel like you may hurt yourself or others, or have thoughts about taking your own life, get help right away. You can go to your nearest emergency department or call:  Your local emergency services (911 in the [place]  A suicide crisis helpline, such as the [facility] at 3107001675. This is open 24 hours a day. Summary  Insomnia is a sleep disorder that makes it difficult to fall asleep or stay asleep.  Insomnia can be long-term (chronic) or  short-term (acute).  Treatment for insomnia depends on the cause. Treatment may focus on treating an underlying condition that is causing insomnia.  Keep a sleep diary to help you and your health care provider figure out what could be causing your insomnia. This information is not intended to replace advice given to you by your health care provider. Make sure you discuss any questions you have with your health care provider. Document Revised: 05/09/2017 Document Reviewed: 03/06/2017 Elsevier Patient Education  [place] injection What is this medicine? DULAGLUTIDE (DOO la GLOO tide) is used to improve blood sugar control in adults with type 2 diabetes. This medicine may be used with other oral diabetes medicines. This drug may also reduce the risk of heart attack or stroke if you have type 2 diabetes and risk factors for heart disease. This medicine may be used for other purposes; ask your health care provider or pharmacist if you have questions. COMMON BRAND NAME(S): Trulicity What should I tell my health care provider before I take this medicine? They need to know if you have any of these conditions:  endocrine tumors (MEN 2) or if someone in your family had these tumors  eye disease, vision problems  history of pancreatitis  kidney disease  liver disease  stomach problems  thyroid cancer or if someone in your family had thyroid cancer  an unusual or allergic reaction to dulaglutide, other medicines, foods, dyes, or preservatives  pregnant or trying to get pregnant  breast-feeding How should I use this medicine? This medicine is for injection under the skin of your upper leg (thigh), stomach area, or upper arm. It is usually given once every week (every 7 days). You will be taught how to prepare and give this medicine. Use exactly as directed. Take your medicine at regular intervals. Do not take it more often than directed. If you use this medicine with  insulin, you should inject this medicine and the insulin separately. Do not mix them together. Do not give the injections right next to each other. Change (rotate) injection sites with each injection. It is important that you put your used needles and syringes in a special sharps container. Do not put them in a trash can. If you do not have a sharps container, call your pharmacist or healthcare provider to get one. A special MedGuide will be given to you by the pharmacist with each prescription and refill. Be sure to read this information carefully each time. This drug comes with INSTRUCTIONS FOR USE. Ask your pharmacist for directions on how to use this drug. Read the information carefully. Talk to your pharmacist or health care provider if you have questions. Talk to your pediatrician regarding the use of this medicine in children. Special care may be needed. Overdosage: If you think you have taken too much of this medicine contact a poison control center or emergency room at once. NOTE: This medicine is only for you. Do not share this medicine with others. What if I miss a dose? If you miss a dose, take it as soon as you can within 3 days after the missed dose. Then take your next dose at your regular weekly time. If it has been longer than 3 days after the missed dose, do not take the missed dose. Take the next dose at your regular time. Do not take double or extra doses. If you have questions about a missed dose, contact your health care provider for advice. What may interact with this medicine?  other medicines for diabetes Many medications may cause changes in blood sugar, these include:  alcohol containing beverages  antiviral medicines for HIV or AIDS  aspirin and aspirin-like drugs  certain medicines for blood pressure, heart disease, irregular heart beat  chromium  diuretics  female hormones, such as estrogens or progestins, birth control  pills  fenofibrate  gemfibrozil  isoniazid  lanreotide  female hormones or anabolic steroids  MAOIs like Carbex, Eldepryl, Marplan, Nardil, and Parnate  medicines for weight loss  medicines for allergies, asthma, cold, or cough  medicines for depression, anxiety, or psychotic disturbances  niacin  nicotine  NSAIDs, medicines for pain and inflammation, like ibuprofen or naproxen  octreotide  pasireotide  pentamidine  phenytoin  probenecid  quinolone antibiotics such as ciprofloxacin, levofloxacin, ofloxacin  some herbal dietary supplements  steroid medicines such as prednisone or cortisone  sulfamethoxazole; trimethoprim  thyroid hormones Some medications can hide the warning symptoms of low blood sugar (hypoglycemia). You may need to monitor your blood sugar more closely if you are taking one of these medications. These include:  beta-blockers, often used for high blood pressure or heart problems (examples include atenolol, metoprolol, propranolol)  clonidine  guanethidine  reserpine This list may not describe all possible interactions.  Give your health care provider a list of all the medicines, herbs, non-prescription drugs, or dietary supplements you use. Also tell them if you smoke, drink alcohol, or use illegal drugs. Some items may interact with your medicine. What should I watch for while using this medicine? Visit your doctor or health care professional for regular checks on your progress. Drink plenty of fluids while taking this medicine. Check with your doctor or health care professional if you get an attack of severe diarrhea, nausea, and vomiting. The loss of too much body fluid can make it dangerous for you to take this medicine. A test called the HbA1C (A1C) will be monitored. This is a simple blood test. It measures your blood sugar control over the last 2 to 3 months. You will receive this test every 3 to 6 months. Learn how to check your blood  sugar. Learn the symptoms of low and high blood sugar and how to manage them. Always carry a quick-source of sugar with you in case you have symptoms of low blood sugar. Examples include hard sugar candy or glucose tablets. Make sure others know that you can choke if you eat or drink when you develop serious symptoms of low blood sugar, such as seizures or unconsciousness. They must get medical help at once. Tell your doctor or health care professional if you have high blood sugar. You might need to change the dose of your medicine. If you are sick or exercising more than usual, you might need to change the dose of your medicine. Do not skip meals. Ask your doctor or health care professional if you should avoid alcohol. Many nonprescription cough and cold products contain sugar or alcohol. These can affect blood sugar. Pens should never be shared. Even if the needle is changed, sharing may result in passing of viruses like hepatitis or HIV. Wear a medical ID bracelet or chain, and carry a card that describes your disease and details of your medicine and dosage times. What side effects may I notice from receiving this medicine? Side effects that you should report to your doctor or health care professional as soon as possible:  allergic reactions like skin rash, itching or hives, swelling of the face, lips, or tongue  breathing problems  changes in vision  diarrhea that continues or is severe  lump or swelling on the neck  severe nausea  signs and symptoms of infection like fever or chills; cough; sore throat; pain or trouble passing urine  signs and symptoms of low blood sugar such as feeling anxious, confusion, dizziness, increased hunger, unusually weak or tired, sweating, shakiness, cold, irritable, headache, blurred vision, fast heartbeat, loss of consciousness  signs and symptoms of kidney injury like trouble passing urine or change in the amount of urine  trouble swallowing  unusual  stomach upset or pain  vomiting Side effects that usually do not require medical attention (report to your doctor or health care professional if they continue or are bothersome):  diarrhea  loss of appetite  nausea  pain, redness, or irritation at site where injected  stomach upset This list may not describe all possible side effects. Call your doctor for medical advice about side effects. You may report side effects to FDA at 1-800-FDA-1088. Where should I keep my medicine? Keep out of the reach of children. Store unopened pens in a refrigerator between 2 and 8 degrees C (36 and 46 degrees F). Do not freeze or use if the medicine has been frozen. Protect  from light and excessive heat. Store in the carton until use. Each single-dose pen can be kept at room temperature, not to exceed 30 degrees C (86 degrees F) for a total of 14 days, if needed. Throw away any unused medicine after the expiration date on the label. NOTE: This sheet is a summary. It may not cover all possible information. If you have questions about this medicine, talk to your doctor, pharmacist, or health care provider.  2020 Elsevier/Gold Standard (2019-02-09 09:34:53)

## 2020-01-25 ENCOUNTER — Other Ambulatory Visit: Payer: Self-pay | Admitting: Internal Medicine

## 2020-01-25 DIAGNOSIS — IMO0002 Reserved for concepts with insufficient information to code with codable children: Secondary | ICD-10-CM

## 2020-01-25 DIAGNOSIS — E1142 Type 2 diabetes mellitus with diabetic polyneuropathy: Secondary | ICD-10-CM

## 2020-01-25 LAB — MICROALBUMIN / CREATININE URINE RATIO
Creatinine, Urine: 88.2 mg/dL
Microalb/Creat Ratio: 110 mg/g creat — ABNORMAL HIGH (ref 0–29)
Microalbumin, Urine: 97.1 ug/mL

## 2020-01-25 NOTE — Telephone Encounter (Signed)
Requested Prescriptions  Pending Prescriptions Disp Refills   insulin glargine (LANTUS SOLOSTAR) 100 UNIT/ML Solostar Pen [Pharmacy Med Name: LANTUS SOLOSTAR 100 UNIT/ML Solution Pen-injector] 45 mL     Sig: INJECT 30 UNITS DAILY. INCREASE BY 2 UNITS EVERY 3 DAYS UNTIL MORNING SUGAR IS LESS THAN 130. MAX OF 40 UNITS.     Endocrinology:  Diabetes - Insulins Failed - 01/25/2020  3:36 AM      Failed - HBA1C is between 0 and 7.9 and within 180 days    HbA1c, POC (controlled diabetic range)  Date Value Ref Range Status  09/23/2019 9.8 (A) 0.0 - 7.0 % Final   Hgb A1c MFr Bld  Date Value Ref Range Status  01/11/2020 9.0 (H) 4.8 - 5.6 % Final    Comment:    (NOTE) Pre diabetes:          5.7%-6.4%  Diabetes:              >6.4%  Glycemic control for   <7.0% adults with diabetes          Passed - Valid encounter within last 6 months    Recent Outpatient Visits          Yesterday Encounter for medication review   [place], Jarome Matin, RPH-CPP   Yesterday Uncontrolled type 2 diabetes mellitus with peripheral neuropathy [facility])   [place], Deborah B, MD   2 months ago Uncontrolled type 2 diabetes mellitus with peripheral neuropathy [facility])   [place], Jarome Matin, RPH-CPP   2 months ago Acute bacterial bronchitis   [place], MD   3 months ago Uncontrolled type 2 diabetes mellitus with peripheral neuropathy [facility])   [place], Jarome Matin, RPH-CPP

## 2020-01-28 ENCOUNTER — Ambulatory Visit (HOSPITAL_BASED_OUTPATIENT_CLINIC_OR_DEPARTMENT_OTHER): Payer: Medicare HMO | Admitting: Pharmacist

## 2020-01-28 ENCOUNTER — Other Ambulatory Visit: Payer: Self-pay

## 2020-01-28 ENCOUNTER — Encounter: Payer: Self-pay | Admitting: Internal Medicine

## 2020-01-28 ENCOUNTER — Ambulatory Visit: Payer: Medicare HMO | Attending: Internal Medicine | Admitting: Internal Medicine

## 2020-01-28 VITALS — BP 136/85 | HR 73 | Temp 98.2°F | Resp 16 | Ht 65.0 in | Wt 232.4 lb

## 2020-01-28 DIAGNOSIS — Z0000 Encounter for general adult medical examination without abnormal findings: Secondary | ICD-10-CM

## 2020-01-28 DIAGNOSIS — Z9189 Other specified personal risk factors, not elsewhere classified: Secondary | ICD-10-CM

## 2020-01-28 DIAGNOSIS — Z0120 Encounter for dental examination and cleaning without abnormal findings: Secondary | ICD-10-CM

## 2020-01-28 DIAGNOSIS — Z6841 Body Mass Index (BMI) 40.0 and over, adult: Secondary | ICD-10-CM | POA: Diagnosis not present

## 2020-01-28 DIAGNOSIS — Z23 Encounter for immunization: Secondary | ICD-10-CM

## 2020-01-28 NOTE — Progress Notes (Signed)
Patient presents for vaccination against zoster and influenza per orders of Dr. Wynetta Emery. Consent given. Counseling provided. No contraindications exists. Vaccine administered without incident.

## 2020-01-28 NOTE — Patient Instructions (Addendum)
We have given you the flu vaccine today and the first shot of the shingles vaccine called Shingrix.  You will be due for your second shot of Shingrix in 2 to 4 months.  Please get established with a dentist to have routine dental exams done at least twice a year.   Influenza Virus Vaccine injection (Fluarix) What is this medicine? INFLUENZA VIRUS VACCINE (in floo EN zuh VAHY ruhs vak SEEN) helps to reduce the risk of getting influenza also known as the flu. This medicine may be used for other purposes; ask your health care provider or pharmacist if you have questions. COMMON BRAND NAME(S): Fluarix, Fluzone What should I tell my health care provider before I take this medicine? They need to know if you have any of these conditions:  bleeding disorder like hemophilia  fever or infection  Guillain-Barre syndrome or other neurological problems  immune system problems  infection with the human immunodeficiency virus (HIV) or AIDS  low blood platelet counts  multiple sclerosis  an unusual or allergic reaction to influenza virus vaccine, eggs, chicken proteins, latex, gentamicin, other medicines, foods, dyes or preservatives  pregnant or trying to get pregnant  breast-feeding How should I use this medicine? This vaccine is for injection into a muscle. It is given by a health care professional. A copy of Vaccine Information Statements will be given before each vaccination. Read this sheet carefully each time. The sheet may change frequently. Talk to your pediatrician regarding the use of this medicine in children. Special care may be needed. Overdosage: If you think you have taken too much of this medicine contact a poison control center or emergency room at once. NOTE: This medicine is only for you. Do not share this medicine with others. What if I miss a dose? This does not apply. What may interact with this medicine?  chemotherapy or radiation therapy  medicines that lower your  immune system like etanercept, anakinra, infliximab, and adalimumab  medicines that treat or prevent blood clots like warfarin  phenytoin  steroid medicines like prednisone or cortisone  theophylline  vaccines This list may not describe all possible interactions. Give your health care provider a list of all the medicines, herbs, non-prescription drugs, or dietary supplements you use. Also tell them if you smoke, drink alcohol, or use illegal drugs. Some items may interact with your medicine. What should I watch for while using this medicine? Report any side effects that do not go away within 3 days to your doctor or health care professional. Call your health care provider if any unusual symptoms occur within 6 weeks of receiving this vaccine. You may still catch the flu, but the illness is not usually as bad. You cannot get the flu from the vaccine. The vaccine will not protect against colds or other illnesses that may cause fever. The vaccine is needed every year. What side effects may I notice from receiving this medicine? Side effects that you should report to your doctor or health care professional as soon as possible:  allergic reactions like skin rash, itching or hives, swelling of the face, lips, or tongue Side effects that usually do not require medical attention (report to your doctor or health care professional if they continue or are bothersome):  fever  headache  muscle aches and pains  pain, tenderness, redness, or swelling at site where injected  weak or tired This list may not describe all possible side effects. Call your doctor for medical advice about side effects. You  may report side effects to FDA at 1-800-FDA-1088. Where should I keep my medicine? This vaccine is only given in a clinic, pharmacy, doctor's office, or other health care setting and will not be stored at home. NOTE: This sheet is a summary. It may not cover all possible information. If you have  questions about this medicine, talk to your doctor, pharmacist, or health care provider.  2020 Elsevier/Gold Standard (2007-12-23 09:30:40)  Zoster Vaccine, Recombinant injection What is this medicine? ZOSTER VACCINE (ZOS ter vak SEEN) is used to prevent shingles in adults 54 years old and over. This vaccine is not used to treat shingles or nerve pain from shingles. This medicine may be used for other purposes; ask your health care provider or pharmacist if you have questions. COMMON BRAND NAME(S): [facility] What should I tell my health care provider before I take this medicine? They need to know if you have any of these conditions:  blood disorders or disease  cancer like leukemia or lymphoma  immune system problems or therapy  an unusual or allergic reaction to vaccines, other medications, foods, dyes, or preservatives  pregnant or trying to get pregnant  breast-feeding How should I use this medicine? This vaccine is for injection in a muscle. It is given by a health care professional. Talk to your pediatrician regarding the use of this medicine in children. This medicine is not approved for use in children. Overdosage: If you think you have taken too much of this medicine contact a poison control center or emergency room at once. NOTE: This medicine is only for you. Do not share this medicine with others. What if I miss a dose? Keep appointments for follow-up (booster) doses as directed. It is important not to miss your dose. Call your doctor or health care professional if you are unable to keep an appointment. What may interact with this medicine?  medicines that suppress your immune system  medicines to treat cancer  steroid medicines like prednisone or cortisone This list may not describe all possible interactions. Give your health care provider a list of all the medicines, herbs, non-prescription drugs, or dietary supplements you use. Also tell them if you smoke, drink  alcohol, or use illegal drugs. Some items may interact with your medicine. What should I watch for while using this medicine? Visit your doctor for regular check ups. This vaccine, like all vaccines, may not fully protect everyone. What side effects may I notice from receiving this medicine? Side effects that you should report to your doctor or health care professional as soon as possible:  allergic reactions like skin rash, itching or hives, swelling of the face, lips, or tongue  breathing problems Side effects that usually do not require medical attention (report these to your doctor or health care professional if they continue or are bothersome):  chills  headache  fever  nausea, vomiting  redness, warmth, pain, swelling or itching at site where injected  tiredness This list may not describe all possible side effects. Call your doctor for medical advice about side effects. You may report side effects to FDA at 1-800-FDA-1088. Where should I keep my medicine? This vaccine is only given in a clinic, pharmacy, doctor's office, or other health care setting and will not be stored at home. NOTE: This sheet is a summary. It may not cover all possible information. If you have questions about this medicine, talk to your doctor, pharmacist, or health care provider.  2020 Elsevier/Gold Standard (2017-01-06 13:20:30)

## 2020-01-28 NOTE — Progress Notes (Signed)
Subjective:   Sarah [name] is a 54 y.o. female who presents for an Initial Medicare Annual Wellness Visit.  DM with peripheral neuropathy(more so due to chemo), HTN, HL, OSA, Vit D def, midline LBP,stage IIIc low-grade serous carcinoma of the ovaryS/p BSO, omentectomy, appendectomy on 07/28/14. (S/p adjuvant chemotherapy with 6 cycles of paclitaxel and carboplatin completed on 12/08/14. S/p an ex lap, hernia repair and small bowel resection for a presumed recurrence (benign pathology), Hypercalcemia with elevated free light chains/ likely MGUS (followed by [name]), 1.1 cm RLL nodule (bx negative 01/2020), RT thyroid nodule (does not met criteria for bx 10/2019)  Review of Systems           Objective:    Today's Vitals   01/28/20 1030  BP: 136/85  Pulse: 73  Resp: 16  Temp: 98.2 F (36.8 C)  SpO2: 95%  Weight: 232 lb 6.4 oz (105.4 kg)  Height: _0  (1.651 m)   Body mass index is 38.67 kg/m.  Advanced Directives 01/28/2020 01/11/2020 01/05/2020 01/05/2020 04/27/2019 12/17/2018 11/23/2018  Does Patient Have a Medical Advance Directive? _1  No No  Does patient want to make changes to medical advance directive? - - - - - - -  Copy of [place] in Chart? - - - - - - -  Would patient like information on creating a medical advance directive? No - Patient declined No - Patient declined - - No - Patient declined No - Patient declined No - Patient declined    Current Medications (verified) Outpatient Encounter Medications as of 01/28/2020  Medication Sig   albuterol (VENTOLIN HFA) 108 (90 Base) MCG/ACT inhaler Inhale 2 puffs into the lungs every 6 (six) hours as needed for wheezing or shortness of breath.   amLODipine (NORVASC) 10 MG tablet Take 1 tablet (10 mg total) by mouth daily.   Blood Glucose Monitoring Suppl (ACCU-CHEK AVIVA PLUS) w/Device KIT USE AS DIRECTED   Blood Glucose Monitoring Suppl (ACCU-CHEK AVIVA) device Use as instructed    Cholecalciferol (VITAMIN D3) 10 MCG (400 UNIT) tablet Take 400 Units by mouth once a week.   diclofenac sodium (VOLTAREN) 1 % GEL Apply 2 g topically 4 (four) times daily. (Patient taking differently: Apply 2 g topically 4 (four) times daily as needed (back pain.). )   Dulaglutide (TRULICITY) 7.20 NO/7.0JG SOPN Inject 0.5 mLs (0.75 mg total) into the skin once a week.   gabapentin (NEURONTIN) 600 MG tablet Take 1 tablet (600 mg total) by mouth 3 (three) times daily.   glimepiride (AMARYL) 4 MG tablet Take 1 tablet (4 mg total) by mouth daily with breakfast.   glucose blood (ACCU-CHEK AVIVA PLUS) test strip Use as instructed   hydrALAZINE (APRESOLINE) 25 MG tablet Take 1.5 tablets (37.5 mg total) by mouth 3 (three) times daily.   hydrochlorothiazide (HYDRODIURIL) 25 MG tablet Take 0.5 tablets (12.5 mg total) by mouth daily.   insulin glargine (LANTUS SOLOSTAR) 100 UNIT/ML Solostar Pen INJECT 30 UNITS DAILY. INCREASE BY 2 UNITS EVERY 3 DAYS UNTIL MORNING SUGAR IS LESS THAN 130. MAX OF 40 UNITS.   Insulin Pen Needle 29G X 12MM MISC Use as directed.   Lancets (ACCU-CHEK MULTICLIX) lancets    Lancets (ACCU-CHEK SOFT TOUCH) lancets Use as instructed   Lancets Misc. (ACCU-CHEK SOFTCLIX LANCET DEV) KIT Use as directed   losartan (COZAAR) 50 MG tablet Take 1 tablet (50 mg total) by mouth daily.   meloxicam (MOBIC) 15 MG tablet TAKE  1 TABLET EVERY DAY   metFORMIN (GLUCOPHAGE) 1000 MG tablet Take 1,000 mg by mouth 2 (two) times daily.   metoprolol tartrate (LOPRESSOR) 25 MG tablet TAKE 1/2 TABLET BY MOUTH 2 TIMES A DAY (Patient taking differently: Take 12.5 mg by mouth 2 (two) times daily. )   rosuvastatin (CRESTOR) 20 MG tablet Take 1 tablet (20 mg total) by mouth daily. Stop Atorvastatin   timolol (BETIMOL) 0.5 % ophthalmic solution Place 1 drop into both eyes daily.   Travoprost, BAK Free, (TRAVATAN) 0.004 % SOLN ophthalmic solution Place 1 drop into both eyes at bedtime.    [DISCONTINUED] blood glucose meter kit and supplies Dispense based on patient and insurance preference. Use up to four times daily as directed. (FOR ICD-9 250.00, 250.01).   No facility-administered encounter medications on file as of 01/28/2020.    Allergies (verified) Emend [aprepitant] and Lisinopril   History: Past Medical History:  Diagnosis Date   Arthritis    Diabetes mellitus without complication ([place])    Family history of adverse reaction to anesthesia    sister-n/v   Family history of colon cancer    GERD (gastroesophageal reflux disease)    Glaucoma 02/16   Glucagonoma 07/28/14   Pt denies this but states she has glaucoma   History of chemotherapy    History of hiatal hernia    Hypertension    Imbalance    Neuropathy    feet bilat   Nocturia    Peritoneal carcinomatosis ([place])    carcinoma of ovary    Shortness of breath dyspnea    hx of 2013 - no problems currently    Sleep apnea    Thyroid nodule    history of    Wears glasses    Past Surgical History:  Procedure Laterality Date   ABDOMINAL HYSTERECTOMY     FOOT SURGERY  04/2018   [facility]   INCISIONAL HERNIA REPAIR N/A 07/06/2015   Procedure: INCISIONAL HERNIA REPAIR ;  Surgeon: Rolm Bookbinder, MD;  Location: [facility];  Service: General;  Laterality: N/A;   INSERTION OF MESH N/A 07/06/2015   Procedure: WITH INSERTION OF PHASIX ST MESH;  Surgeon: Rolm Bookbinder, MD;  Location: [facility];  Service: General;  Laterality: N/A;   LAPAROTOMY N/A 07/06/2015   Procedure: EXPLORATORY LAPAROTOMY;  Surgeon: Everitt Amber, MD;  Location: [facility];  Service: Gynecology;  Laterality: N/A;   LYSIS OF ADHESION N/A 07/06/2015   Procedure:  LYSIS OF ADHESION RESECTION OF MESENTERIC MASS BOWEL RESECTION ;  Surgeon: Everitt Amber, MD;  Location: [facility];  Service: Gynecology;  Laterality: N/A;   ROBOTIC ASSISTED TOTAL HYSTERECTOMY WITH BILATERAL SALPINGO OOPHERECTOMY Bilateral 07/28/2014   Procedure: ROBOTIC  ASSISTED lysis of adhesions with biopsies, converted to LAPAROTOMY, bilateral salpingoorphorectomy, omentectomy,appendectomy;  Surgeon: Janie Morning, MD;  Location: [facility];  Service: Gynecology;  Laterality: Bilateral;   THYROIDECTOMY, PARTIAL     VIDEO BRONCHOSCOPY N/A 01/13/2020   Procedure: VIDEO BRONCHOSCOPY WITH BIOPSIES;  Surgeon: Melrose Nakayama, MD;  Location: [facility];  Service: Thoracic;  Laterality: N/A;   Family History  Problem Relation Age of Onset   Hypertension Mother    Hypertension Father    Diabetes Father    Cancer Sister 15       fibrosarcoma (back); currently 31   Prostate cancer Maternal Uncle    Colon cancer Paternal Aunt        Dx 90s; deceased 46s   Prostate cancer Paternal Uncle  currently 59   Cancer Paternal Uncle 81       "bone" ; unk. primary   Stomach cancer Paternal Uncle    Social History   Socioeconomic History   Marital status: Married    Spouse name: Not on file   Number of children: 2   Years of education: Not on file   Highest education level: High school graduate  Occupational History   Occupation: disability  Tobacco Use   Smoking status: Never Smoker   Smokeless tobacco: Never Used  Scientific laboratory technician Use: Never used  Substance and Sexual Activity   Alcohol use: No   Drug use: No   Sexual activity: Not on file  Other Topics Concern   Not on file  Social History Narrative   No issues with transportation.    Attends church   Social Determinants of Radio broadcast assistant Strain:    Difficulty of Paying Living Expenses: Not on file  Food Insecurity:    Worried About Charity fundraiser in the Last Year: Not on file   [employer] of Food in the Last Year: Not on file  Transportation Needs:    Lack of Transportation (Medical): Not on file   Lack of Transportation (Non-Medical): Not on file  Physical Activity:    Days of Exercise per Week: Not on file   Minutes of Exercise per  Session: Not on file  Stress:    Feeling of Stress : Not on file  Social Connections:    Frequency of Communication with Friends and Family: Not on file   Frequency of Social Gatherings with Friends and Family: Not on file   Attends Religious Services: Not on file   Active Member of Clubs or Organizations: Not on file   Attends Archivist Meetings: Not on file   Marital Status: Not on file    Tobacco Counseling Never a smoker  Clinical Intake: Pain : No/denies pain Diabetes: Yes CBG done?: No  Diabetic?  Diabetes management addressed on last visit 4 days ago.  Activities of Daily Living In your present state of health, do you have any difficulty performing the following activities: 01/28/2020 01/11/2020  Hearing? N N  Vision? N N  Difficulty concentrating or making decisions? N N  Walking or climbing stairs? N N  Dressing or bathing? N N  Doing errands, shopping? N N  Preparing Food and eating ? N -  Using the Toilet? N -  In the past six months, have you accidently leaked urine? Y -  Do you have problems with loss of bowel control? N -  Managing your Medications? N -  Managing your Finances? N -  Housekeeping or managing your Housekeeping? N -  Some recent data might be hidden  Occasionally leakage of urine with coughing or laughing.  She does not have to wear pads or adult undergarments.  Patient Care Team: Ladell Pier, MD as PCP - General (Internal Medicine) Dr. Rolanda Jay [facility] on 07/26/2019.  Surgery: Dr. Virgina Jock  GYN/Oncology: [name] Oncology:  [name] CT surgeon:  Dr. Linden Dolin any recent Medical Services you may have received from other than [facility] providers in the past year (date may be approximate).     Assessment:   This is a routine wellness examination for Elisha.  Hearing/Vision screen  Hearing Screening   Method: Audiometry   _0  _1  _2  _3  _4  _5  _6  _7  _8   Right ear:  nl nl nl nl  nl    Left ear:  nl nl nl nl  nl      Visual Acuity Screening   Right eye Left eye Both eyes  Without correction: 20 50 20 50 20 50  With correction: _0 Dietary issues and exercise activities discussed: Discussed 4 days ago on routine follow-up visit.   Goals     Weight (lb) < 200 lb (90.7 kg)     I would like to lose enough weight to get off insulin.      Depression Screen PHQ 2/9 Scores 01/28/2020 01/24/2020 04/27/2019 03/22/2019 08/28/2018 05/22/2018 04/09/2018  PHQ - 2 Score 0 0 0 0 0 0 1  PHQ- 9 Score - - - - - - -    Fall Risk Fall Risk  01/28/2020 01/24/2020 04/27/2019 03/22/2019 11/17/2017  Falls in the past year? 0 0 0 0 No  Number falls in past yr: 0 0 - - -  Injury with Fall? 0 0 - - -    Any stairs in or around the home? Yes  at front door If so, are there any without handrails? Yes  Home free of loose throw rugs in walkways, pet beds, electrical cords, etc? Yes  Adequate lighting in your home to reduce risk of falls? Yes   ASSISTIVE DEVICES UTILIZED TO PREVENT FALLS:  Life alert? No  Use of a cane, walker or w/c? No  Grab bars in the bathroom? No Shower chair or bench in shower? No  Elevated toilet seat or a handicapped toilet? No   TIMED UP AND GO:  Was the test performed? Yes .  Length of time to ambulate 10 feet: 8 sec.   Gait steady and fast without use of assistive device  Cognitive Function: MMSE - Mini Mental State Exam 01/28/2020  Orientation to time 5  Orientation to Place 5  Registration 3  Attention/ Calculation 5  Recall 3  Language- name 2 objects 2  Language- repeat 1  Language- follow 3 step command 3  Language- read & follow direction 1  Write a sentence 1  Copy design 1  Total score 30        Immunizations Immunization History  Administered Date(s) Administered   Influenza Whole 03/22/2019   Influenza,inj,Quad PF,6+ Mos 06/29/2014, 03/27/2015, 03/14/2016, 02/17/2017,  02/20/2018, 01/28/2020   PFIZER SARS-COV-2 Vaccination 09/10/2019, 10/04/2019   Pneumococcal Polysaccharide-23 06/29/2014   Tdap 11/17/2017   Zoster Recombinat (Shingrix) 01/28/2020    TDAP status: Up to date Flu Vaccine status: Completed at today's visit Pneumococcal vaccine status: Up to date Covid-19 vaccine status: Completed vaccines  Qualifies for Shingles Vaccine? Yes   Zostavax completed No   Shingrix Completed?: No.    Education has been provided regarding the importance of this vaccine. Patient has been advised to call insurance company to determine out of pocket expense if they have not yet received this vaccine. Advised may also receive vaccine at local pharmacy or Health Dept. Verbalized acceptance and understanding.  Patient agreed to receive the vaccine today.  First shot given today.  Screening Tests Health Maintenance  Topic Date Due   HIV Screening  03/21/2020 (Originally 12/15/1980)   HEMOGLOBIN A1C  07/13/2020   OPHTHALMOLOGY EXAM  01/09/2021   FOOT EXAM  01/23/2021   MAMMOGRAM  07/13/2021   COLONOSCOPY  10/03/2026   TETANUS/TDAP  11/18/2027   INFLUENZA VACCINE  Completed   PNEUMOCOCCAL POLYSACCHARIDE VACCINE AGE  2-64 HIGH RISK  Completed   COVID-19 Vaccine  Completed   Hepatitis C Screening  Completed    Health Maintenance  Patient up-to-date with colon cancer screening. Patient up-to-date with mammogram. She does not qualify for bone density study as yet.  Lung Cancer Screening: (Low Dose CT Chest recommended if Age 38-80 years, 30 pack-year currently smoking OR have quit w/in 15years.) does not qualify.   Lung Cancer Screening Referral: NA  Additional Screening:  Hepatitis C Screening: does qualify; Completed 08/18/2019  Vision Screening: Recommended annual ophthalmology exams for early detection of glaucoma and other disorders of the eye. Is the patient up to date with their annual eye exam?  Yes  Who is the provider or what is the  name of the office in which the patient attends annual eye exams?  See above If pt is not established with a provider, would they like to be referred to a provider to establish care? NA.   Dental Screening: Recommended annual dental exams for proper oral hygiene.  No routine dental screen in years.  Encourage patient to get established with a dentist to get routine dental care.  Community Resource Referral / Chronic Care Management: CRR required this visit?  No   CCM required this visit?  No      Plan:    1. Encounter for Medicare annual wellness exam   2. Need for influenza vaccination Given today  3. Need for shingles vaccine First shot of Shingrix given today.  4. Class 3 severe obesity due to excess calories with serious comorbidity and body mass index (BMI) of 45.0 to 49.9 in adult [facility]) Dietary counseling given on most recent visit.  She has an upcoming appointment with medical weight management.  5. Need for dental care Encourage patient to get established with a dentist to start getting routine dental care.  I have personally reviewed and noted the following in the patients chart:    Medical and social history  Use of alcohol, tobacco or illicit drugs   Current medications and supplements  Functional ability and status  Nutritional status  Physical activity  Advanced directives  List of other physicians  Hospitalizations, surgeries, and ER visits in previous 12 months  Vitals  Screenings to include cognitive, depression, and falls  Referrals and appointments  In addition, I have reviewed and discussed with patient certain preventive protocols, quality metrics, and best practice recommendations. A written personalized care plan for preventive services as well as general preventive health recommendations were provided to patient.     Karle Plumber, MD   01/28/2020

## 2020-02-01 ENCOUNTER — Telehealth: Payer: Self-pay

## 2020-02-01 ENCOUNTER — Encounter: Payer: Self-pay | Admitting: Thoracic Surgery (Cardiothoracic Vascular Surgery)

## 2020-02-01 ENCOUNTER — Other Ambulatory Visit: Payer: Self-pay

## 2020-02-01 ENCOUNTER — Ambulatory Visit: Payer: Medicare HMO | Admitting: Thoracic Surgery (Cardiothoracic Vascular Surgery)

## 2020-02-01 VITALS — BP 137/87 | HR 79 | Temp 97.3°F | Resp 20 | Ht 65.0 in | Wt 232.0 lb

## 2020-02-01 DIAGNOSIS — R911 Solitary pulmonary nodule: Secondary | ICD-10-CM | POA: Diagnosis not present

## 2020-02-01 NOTE — Progress Notes (Signed)
[address]       [place] [address]             [phone]     HPI: Sarah [name] returns to discuss the results of her bronchoscopy and treatment of her right lower lobe lung nodule  Sarah [name] is a 54 year old woman with a history of ovarian cancer, hypertension, hyperlipidemia, type II non-insulin-dependent diabetes, thyroid nodule, gout, and a right lower lobe lung nodule.  She was treated for stage IIIc low-grade serous ovarian carcinoma in 2016.  She has been followed since then.  She recently had a CT of the chest which showed an 11 mm smoothly marginated right lower lobe lung nodule.  That nodule was present in 2016 and was about 5 mm at that time.  PET/CT showed no significant metabolic activity.  There was no uptake on the PET dotatate.    She does have occasional cough and wheezing.  She is a lifelong non-smoker.  I did a bronchoscopy and biopsy on 01/13/2020.  There was an endobronchial lesion but biopsy showed only benign bronchial mucosa.  Past Medical History:  Diagnosis Date  . Arthritis   . Diabetes mellitus without complication ([place])   . Family history of adverse reaction to anesthesia    sister-n/v  . Family history of colon cancer   . GERD (gastroesophageal reflux disease)   . Glaucoma 02/16  . Glucagonoma 07/28/14   Pt denies this but states she has glaucoma  . History of chemotherapy   . History of hiatal hernia   . Hypertension   . Imbalance   . Neuropathy    feet bilat  . Nocturia   . Peritoneal carcinomatosis ([place])    carcinoma of ovary   . Shortness of breath dyspnea    hx of 2013 - no problems currently   . Sleep apnea   . Thyroid nodule    history of   . Wears glasses     Current Outpatient Medications  Medication Sig Dispense Refill  . albuterol (VENTOLIN HFA) 108 (90 Base) MCG/ACT inhaler Inhale 2 puffs into the lungs every 6 (six) hours as needed for wheezing or shortness of breath. 8 g 0  . amLODipine (NORVASC)  10 MG tablet Take 1 tablet (10 mg total) by mouth daily. 90 tablet 3  . Blood Glucose Monitoring Suppl (ACCU-CHEK AVIVA PLUS) w/Device KIT USE AS DIRECTED 1 kit 0  . Blood Glucose Monitoring Suppl (ACCU-CHEK AVIVA) device Use as instructed 1 each 0  . Cholecalciferol (VITAMIN D3) 10 MCG (400 UNIT) tablet Take 400 Units by mouth once a week.    . diclofenac sodium (VOLTAREN) 1 % GEL Apply 2 g topically 4 (four) times daily. (Patient taking differently: Apply 2 g topically 4 (four) times daily as needed (back pain.). ) 100 g 3  . Dulaglutide (TRULICITY) 3.74 MO/7.0BE SOPN Inject 0.5 mLs (0.75 mg total) into the skin once a week. 6 mL 3  . gabapentin (NEURONTIN) 600 MG tablet Take 1 tablet (600 mg total) by mouth 3 (three) times daily. 270 tablet 3  . glimepiride (AMARYL) 4 MG tablet Take 1 tablet (4 mg total) by mouth daily with breakfast. 90 tablet 3  . glucose blood (ACCU-CHEK AVIVA PLUS) test strip Use as instructed 100 each 12  . hydrALAZINE (APRESOLINE) 25 MG tablet Take 1.5 tablets (37.5 mg total) by mouth 3 (three) times daily. 405 tablet 3  . hydrochlorothiazide (HYDRODIURIL) 25 MG tablet Take 0.5  tablets (12.5 mg total) by mouth daily. 90 tablet 3  . insulin glargine (LANTUS SOLOSTAR) 100 UNIT/ML Solostar Pen INJECT 30 UNITS DAILY. INCREASE BY 2 UNITS EVERY 3 DAYS UNTIL MORNING SUGAR IS LESS THAN 130. MAX OF 40 UNITS. 45 mL 1  . Insulin Pen Needle 29G X 12MM MISC Use as directed. 100 each 6  . Lancets (ACCU-CHEK MULTICLIX) lancets   0  . Lancets (ACCU-CHEK SOFT TOUCH) lancets Use as instructed 100 each 12  . Lancets Misc. (ACCU-CHEK SOFTCLIX LANCET DEV) KIT Use as directed 1 kit 0  . losartan (COZAAR) 50 MG tablet Take 1 tablet (50 mg total) by mouth daily. 90 tablet 3  . meloxicam (MOBIC) 15 MG tablet TAKE 1 TABLET EVERY DAY 90 tablet 1  . metFORMIN (GLUCOPHAGE) 1000 MG tablet Take 1,000 mg by mouth 2 (two) times daily.    . metoprolol tartrate (LOPRESSOR) 25 MG tablet TAKE 1/2 TABLET BY  MOUTH 2 TIMES A DAY (Patient taking differently: Take 12.5 mg by mouth 2 (two) times daily. ) 90 tablet 3  . rosuvastatin (CRESTOR) 20 MG tablet Take 1 tablet (20 mg total) by mouth daily. Stop Atorvastatin 90 tablet 3  . timolol (BETIMOL) 0.5 % ophthalmic solution Place 1 drop into both eyes daily.    . Travoprost, BAK Free, (TRAVATAN) 0.004 % SOLN ophthalmic solution Place 1 drop into both eyes at bedtime.     No current facility-administered medications for this visit.    Physical Exam BP 137/87 (BP Location: Right Arm, Patient Position: Sitting, Cuff Size: Large)   Pulse 79   Temp (!) 97.3 F (36.3 C) (Temporal)   Resp 20   Ht $R'5\' 5"'YC$  (1.651 m)   Wt 232 lb (105.2 kg)   LMP  (LMP Unknown)   SpO2 94% Comment: RA  BMI 38.61 kg/m  Obese 54 year old woman in no acute distress Alert and oriented x3 no focal deficits Lungs clear bilaterally Cardiac regular rate and rhythm  Diagnostic Tests: FINAL MICROSCOPIC DIAGNOSIS:   A. LUNG, RIGHT LOWER ENDOBRONCHIAL, BIOPSY:  - Benign bronchial tissue  Impression: Sarah [name] is a 54 year old woman with a history of stage IIIc ovarian cancer, hypertension, hyperlipidemia, type II non-insulin-dependent diabetes, thyroid nodule, gout, and a right lower lobe lung nodule.  Her right lower lobe lung nodule is a smooth rounded nodule.  There was no activity on PET CT or PET dotatate.  Biopsy showed only benign bronchial mucosa.  This is most likely a benign bronchial adenoma.  The right lower lobe nodule has grown very slowly from 5 to 11 mm over the past 5 years.  There is nothing to indicate malignancy.  I do not think there is any relation to her hypercalcemia.  I do think it will eventually need to come out but I do not think there is any rush to take it out at this point.  We are about to go into reduced elective surgeries due to COVID-19, so we may have difficulty getting her on the schedule for the next couple of months.  Given that we are  going to be cut back on elective surgeries, we will plan to rescan her again 6 months from her most recent scan which was about a month ago.  I will see her back at that time and we can talk about surgical resection.  If she were to have issues with worsening wheezing or coughing or recurrent pulmonary infections we may need to go ahead sooner.  Plan: Return in  5 months with CT chest  Melrose Nakayama, MD [facility] [phone]

## 2020-02-01 NOTE — Telephone Encounter (Signed)
Called and scheduled bone marrow for 8/26, instructed to arrive at [facility] for a 0730 appt. Labs scheduled at 0900. Flow cytometry called are are ware of bone marrow date and time.

## 2020-02-02 ENCOUNTER — Other Ambulatory Visit: Payer: Self-pay

## 2020-02-02 DIAGNOSIS — Z9889 Other specified postprocedural states: Secondary | ICD-10-CM

## 2020-02-02 DIAGNOSIS — D8989 Other specified disorders involving the immune mechanism, not elsewhere classified: Secondary | ICD-10-CM

## 2020-02-03 ENCOUNTER — Inpatient Hospital Stay: Payer: Medicare HMO

## 2020-02-03 ENCOUNTER — Other Ambulatory Visit: Payer: Self-pay

## 2020-02-03 ENCOUNTER — Inpatient Hospital Stay (HOSPITAL_BASED_OUTPATIENT_CLINIC_OR_DEPARTMENT_OTHER): Payer: Medicare HMO | Admitting: Adult Health

## 2020-02-03 VITALS — BP 115/73 | HR 73 | Temp 97.8°F | Resp 16

## 2020-02-03 DIAGNOSIS — D472 Monoclonal gammopathy: Secondary | ICD-10-CM

## 2020-02-03 DIAGNOSIS — C561 Malignant neoplasm of right ovary: Secondary | ICD-10-CM | POA: Diagnosis not present

## 2020-02-03 DIAGNOSIS — Z79899 Other long term (current) drug therapy: Secondary | ICD-10-CM | POA: Diagnosis not present

## 2020-02-03 DIAGNOSIS — Z9889 Other specified postprocedural states: Secondary | ICD-10-CM

## 2020-02-03 DIAGNOSIS — Z9221 Personal history of antineoplastic chemotherapy: Secondary | ICD-10-CM | POA: Diagnosis not present

## 2020-02-03 DIAGNOSIS — K760 Fatty (change of) liver, not elsewhere classified: Secondary | ICD-10-CM | POA: Diagnosis not present

## 2020-02-03 DIAGNOSIS — I700 Atherosclerosis of aorta: Secondary | ICD-10-CM | POA: Diagnosis not present

## 2020-02-03 DIAGNOSIS — D8989 Other specified disorders involving the immune mechanism, not elsewhere classified: Secondary | ICD-10-CM | POA: Diagnosis not present

## 2020-02-03 DIAGNOSIS — I10 Essential (primary) hypertension: Secondary | ICD-10-CM | POA: Diagnosis not present

## 2020-02-03 DIAGNOSIS — Z800 Family history of malignant neoplasm of digestive organs: Secondary | ICD-10-CM | POA: Diagnosis not present

## 2020-02-03 LAB — CBC WITH DIFFERENTIAL (CANCER CENTER ONLY)
Abs Immature Granulocytes: 0.05 10*3/uL (ref 0.00–0.07)
Basophils Absolute: 0 10*3/uL (ref 0.0–0.1)
Basophils Relative: 0 %
Eosinophils Absolute: 0.2 10*3/uL (ref 0.0–0.5)
Eosinophils Relative: 2 %
HCT: 41.1 % (ref 36.0–46.0)
Hemoglobin: 13.3 g/dL (ref 12.0–15.0)
Immature Granulocytes: 1 %
Lymphocytes Relative: 37 %
Lymphs Abs: 2.6 10*3/uL (ref 0.7–4.0)
MCH: 29.8 pg (ref 26.0–34.0)
MCHC: 32.4 g/dL (ref 30.0–36.0)
MCV: 92.2 fL (ref 80.0–100.0)
Monocytes Absolute: 0.8 10*3/uL (ref 0.1–1.0)
Monocytes Relative: 12 %
Neutro Abs: 3.3 10*3/uL (ref 1.7–7.7)
Neutrophils Relative %: 48 %
Platelet Count: 270 10*3/uL (ref 150–400)
RBC: 4.46 MIL/uL (ref 3.87–5.11)
RDW: 13.5 % (ref 11.5–15.5)
WBC Count: 6.9 10*3/uL (ref 4.0–10.5)
nRBC: 0 % (ref 0.0–0.2)

## 2020-02-03 MED ORDER — LIDOCAINE HCL 2 % IJ SOLN
INTRAMUSCULAR | Status: AC
  Filled 2020-02-03: qty 20

## 2020-02-03 NOTE — Patient Instructions (Signed)
Bone Marrow Aspiration and Bone Marrow Biopsy, Adult, Care After This sheet gives you information about how to care for yourself after your procedure. Your health care provider may also give you more specific instructions. If you have problems or questions, contact your health care provider. What can I expect after the procedure? After the procedure, it is common to have:  Mild pain and tenderness.  Swelling.  Bruising. Follow these instructions at home: Puncture site care   Follow instructions from your health care provider about how to take care of the puncture site. Make sure you: ? Wash your hands with soap and water before and after you change your bandage (dressing). If soap and water are not available, use hand sanitizer. ? Change your dressing as told by your health care provider.  Check your puncture site every day for signs of infection. Check for: ? More redness, swelling, or pain. ? Fluid or blood. ? Warmth. ? Pus or a bad smell. Activity  Return to your normal activities as told by your health care provider. Ask your health care provider what activities are safe for you.  Do not lift anything that is heavier than 10 lb (4.5 kg), or the limit that you are told, until your health care provider says that it is safe.  Do not drive for 24 hours if you were given a sedative during your procedure. General instructions   Take over-the-counter and prescription medicines only as told by your health care provider.  Do not take baths, swim, or use a hot tub until your health care provider approves. Ask your health care provider if you may take showers. You may only be allowed to take sponge baths.  If directed, put ice on the affected area. To do this: ? Put ice in a plastic bag. ? Place a towel between your skin and the bag. ? Leave the ice on for 20 minutes, 2-3 times a day.  Keep all follow-up visits as told by your health care provider. This is important. Contact a  health care provider if:  Your pain is not controlled with medicine.  You have a fever.  You have more redness, swelling, or pain around the puncture site.  You have fluid or blood coming from the puncture site.  Your puncture site feels warm to the touch.  You have pus or a bad smell coming from the puncture site. Summary  After the procedure, it is common to have mild pain, tenderness, swelling, and bruising.  Follow instructions from your health care provider about how to take care of the puncture site and what activities are safe for you.  Take over-the-counter and prescription medicines only as told by your health care provider.  Contact a health care provider if you have any signs of infection, such as fluid or blood coming from the puncture site. This information is not intended to replace advice given to you by your health care provider. Make sure you discuss any questions you have with your health care provider. Document Revised: 10/13/2018 Document Reviewed: 10/13/2018 Elsevier Patient Education  2020 [employer]  

## 2020-02-03 NOTE — Progress Notes (Signed)
INDICATION:multiple myeloma work up  Brief examination was performed. ENT: adequate airway clearance Heart: regular rate and rhythm.No Murmurs Lungs: clear to auscultation, no wheezes, normal respiratory effort  Bone Marrow Biopsy and Aspiration Procedure Note   Informed consent was obtained and potential risks including bleeding, infection and pain were reviewed with the patient.  The patient's name, date of birth, identification, consent and allergies were verified prior to the start of procedure and time out was performed.  The right posterior iliac crest was chosen as the site of biopsy.  The skin was prepped with ChloraPrep.   16 cc of 2% lidocaine was used to provide local anaesthesia.   10 cc of bone marrow aspirate was obtained followed by 1cm biopsy.  Pressure was applied to the biopsy site and bandage was placed over the biopsy site. Patient was made to lie on the back for 30 mins prior to discharge.  The procedure was tolerated well. COMPLICATIONS: None BLOOD LOSS: none The patient was discharged home in stable condition with a 1 week follow up to review results.  Patient was provided with post bone marrow biopsy instructions and instructed to call if there was any bleeding or worsening pain.  Specimens sent for flow cytometry, cytogenetics and additional studies.  Signed Scot Dock, NP

## 2020-02-03 NOTE — Progress Notes (Signed)
Bone marrow biopsy and aspiration completed by Wilber Bihari, NP this morning. Procedure began at 0800 with time out completed and concluded at 0830. Bandage applied and patient observed for 30 minutes post-procedure.  Bandage observed and remains clean, dry, and intact. Education provided with patient verbalizing an understanding of the teaching.

## 2020-02-04 ENCOUNTER — Other Ambulatory Visit: Payer: Self-pay

## 2020-02-08 ENCOUNTER — Encounter: Payer: Self-pay | Admitting: Internal Medicine

## 2020-02-09 LAB — SURGICAL PATHOLOGY

## 2020-02-09 NOTE — Progress Notes (Signed)
[facility]   [phone] [phone]   Clinic Follow up Note   Patient Care Team: Ladell Pier, MD as PCP - General (Internal Medicine)   I connected with Sarah [name] on 02/10/2020 at  4:00 PM EDT by telephone visit and verified that I am speaking with the correct person using two identifiers.  I discussed the limitations, risks, security and privacy concerns of performing an evaluation and management service by telephone and the availability of in person appointments. I also discussed with the patient that there may be a patient responsible charge related to this service. The patient expressed understanding and agreed to proceed.   Other persons participating in the visit and their role in the encounter:  None   Patient's location:  Home  Provider's location:  Office   CHIEF COMPLAINT: Discuss bone marrow biopsy result  SUMMARY OF ONCOLOGIC HISTORY: Oncology History Overview Note  T3    Primary peritoneal carcinomatosis ([place]) (Resolved)  08/21/2014 Initial Diagnosis   Primary peritoneal carcinomatosis      CURRENT THERAPY:  Observation  INTERVAL HISTORY:  Sarah [name] is scheduled for a phone visit to discuss her recent pulmonary biopsy results.  She identified herself by date of birth and address.  She tolerated the biopsy well overall, does have mild soreness at the biopsy site, no hematoma or other new complaints.  She is otherwise doing well, review of systems otherwise negative.  MEDICAL HISTORY:  Past Medical History:  Diagnosis Date  . Arthritis   . Diabetes mellitus without complication ([place])   . Family history of adverse reaction to anesthesia    sister-n/v  . Family history of colon cancer   . GERD (gastroesophageal reflux disease)   . Glaucoma 02/16  . Glucagonoma 07/28/14   Pt denies this but states she has glaucoma  . History of chemotherapy   . History of hiatal hernia   . Hypertension   . Imbalance   .  Neuropathy    feet bilat  . Nocturia   . Peritoneal carcinomatosis ([place])    carcinoma of ovary   . Shortness of breath dyspnea    hx of 2013 - no problems currently   . Sleep apnea   . Thyroid nodule    history of   . Wears glasses     SURGICAL HISTORY: Past Surgical History:  Procedure Laterality Date  . ABDOMINAL HYSTERECTOMY    . FOOT SURGERY  04/2018   [facility]  . INCISIONAL HERNIA REPAIR N/A 07/06/2015   Procedure: INCISIONAL HERNIA REPAIR ;  Surgeon: Rolm Bookbinder, MD;  Location: [facility];  Service: General;  Laterality: N/A;  . INSERTION OF MESH N/A 07/06/2015   Procedure: WITH INSERTION OF PHASIX ST MESH;  Surgeon: Rolm Bookbinder, MD;  Location: [facility];  Service: General;  Laterality: N/A;  . LAPAROTOMY N/A 07/06/2015   Procedure: EXPLORATORY LAPAROTOMY;  Surgeon: Everitt Amber, MD;  Location: [facility];  Service: Gynecology;  Laterality: N/A;  . LYSIS OF ADHESION N/A 07/06/2015   Procedure:  LYSIS OF ADHESION RESECTION OF MESENTERIC MASS BOWEL RESECTION ;  Surgeon: Everitt Amber, MD;  Location: [facility];  Service: Gynecology;  Laterality: N/A;  . ROBOTIC ASSISTED TOTAL HYSTERECTOMY WITH BILATERAL SALPINGO OOPHERECTOMY Bilateral 07/28/2014   Procedure: ROBOTIC ASSISTED lysis of adhesions with biopsies, converted to LAPAROTOMY, bilateral salpingoorphorectomy, omentectomy,appendectomy;  Surgeon: Janie Morning, MD;  Location: [facility];  Service: Gynecology;  Laterality: Bilateral;  . THYROIDECTOMY, PARTIAL    . VIDEO BRONCHOSCOPY  N/A 01/13/2020   Procedure: VIDEO BRONCHOSCOPY WITH BIOPSIES;  Surgeon: Melrose Nakayama, MD;  Location: [facility];  Service: Thoracic;  Laterality: N/A;    I have reviewed the social history and family history with the patient and they are unchanged from previous note.  ALLERGIES:  is allergic to [facility] [aprepitant] and lisinopril.  MEDICATIONS:  Current Outpatient Medications  Medication Sig Dispense Refill  . albuterol (VENTOLIN HFA) 108 (90 Base)  MCG/ACT inhaler Inhale 2 puffs into the lungs every 6 (six) hours as needed for wheezing or shortness of breath. 8 g 0  . amLODipine (NORVASC) 10 MG tablet Take 1 tablet (10 mg total) by mouth daily. 90 tablet 3  . Blood Glucose Monitoring Suppl (ACCU-CHEK AVIVA PLUS) w/Device KIT USE AS DIRECTED 1 kit 0  . Blood Glucose Monitoring Suppl (ACCU-CHEK AVIVA) device Use as instructed 1 each 0  . Cholecalciferol (VITAMIN D3) 10 MCG (400 UNIT) tablet Take 400 Units by mouth once a week.    . diclofenac sodium (VOLTAREN) 1 % GEL Apply 2 g topically 4 (four) times daily. (Patient taking differently: Apply 2 g topically 4 (four) times daily as needed (back pain.). ) 100 g 3  . Dulaglutide (TRULICITY) 1.76 HY/0.7PX SOPN Inject 0.5 mLs (0.75 mg total) into the skin once a week. 6 mL 3  . gabapentin (NEURONTIN) 600 MG tablet Take 1 tablet (600 mg total) by mouth 3 (three) times daily. 270 tablet 3  . glimepiride (AMARYL) 4 MG tablet Take 1 tablet (4 mg total) by mouth daily with breakfast. 90 tablet 3  . glucose blood (ACCU-CHEK AVIVA PLUS) test strip Use as instructed 100 each 12  . hydrALAZINE (APRESOLINE) 25 MG tablet Take 1.5 tablets (37.5 mg total) by mouth 3 (three) times daily. 405 tablet 3  . hydrochlorothiazide (HYDRODIURIL) 25 MG tablet Take 0.5 tablets (12.5 mg total) by mouth daily. 90 tablet 3  . insulin glargine (LANTUS SOLOSTAR) 100 UNIT/ML Solostar Pen INJECT 30 UNITS DAILY. INCREASE BY 2 UNITS EVERY 3 DAYS UNTIL MORNING SUGAR IS LESS THAN 130. MAX OF 40 UNITS. 45 mL 1  . Insulin Pen Needle 29G X 12MM MISC Use as directed. 100 each 6  . Lancets (ACCU-CHEK MULTICLIX) lancets   0  . Lancets (ACCU-CHEK SOFT TOUCH) lancets Use as instructed 100 each 12  . Lancets Misc. (ACCU-CHEK SOFTCLIX LANCET DEV) KIT Use as directed 1 kit 0  . losartan (COZAAR) 50 MG tablet Take 1 tablet (50 mg total) by mouth daily. 90 tablet 3  . meloxicam (MOBIC) 15 MG tablet TAKE 1 TABLET EVERY DAY 90 tablet 1  . metFORMIN  (GLUCOPHAGE) 1000 MG tablet Take 1,000 mg by mouth 2 (two) times daily.    . metoprolol tartrate (LOPRESSOR) 25 MG tablet TAKE 1/2 TABLET BY MOUTH 2 TIMES A DAY (Patient taking differently: Take 12.5 mg by mouth 2 (two) times daily. ) 90 tablet 3  . rosuvastatin (CRESTOR) 20 MG tablet Take 1 tablet (20 mg total) by mouth daily. Stop Atorvastatin 90 tablet 3  . timolol (BETIMOL) 0.5 % ophthalmic solution Place 1 drop into both eyes daily.    . Travoprost, BAK Free, (TRAVATAN) 0.004 % SOLN ophthalmic solution Place 1 drop into both eyes at bedtime.     No current facility-administered medications for this visit.    PHYSICAL EXAMINATION: ECOG PERFORMANCE STATUS: 0 - Asymptomatic  No vitals taken today, Exam not performed today   LABORATORY DATA:  I have reviewed the data as listed  CBC Latest Ref Rng & Units 02/03/2020 01/11/2020 12/29/2019  WBC 4.0 - 10.5 K/uL 6.9 6.8 7.1  Hemoglobin 12.0 - 15.0 g/dL 13.3 14.3 13.8  Hematocrit 36 - 46 % 41.1 44.6 42.6  Platelets 150 - 400 K/uL 270 319 295     CMP Latest Ref Rng & Units 01/11/2020 12/29/2019 10/06/2019  Glucose 70 - 99 mg/dL 168(H) 159(H) 316(H)  BUN 6 - 20 mg/dL 17 21(H) 17  Creatinine 0.44 - 1.00 mg/dL 1.10(H) 1.26(H) 1.31(H)  Sodium 135 - 145 mmol/L 137 141 141  Potassium 3.5 - 5.1 mmol/L 3.9 3.9 4.0  Chloride 98 - 111 mmol/L 101 104 102  CO2 22 - 32 mmol/L 24 25 25   Calcium 8.9 - 10.3 mg/dL 10.7(H) 11.7(H) 10.9(H)  Total Protein 6.5 - 8.1 g/dL 7.7 8.3(H) 7.7  Total Bilirubin 0.3 - 1.2 mg/dL 1.0 0.6 0.7  Alkaline Phos 38 - 126 U/L 53 60 58  AST 15 - 41 U/L 35 29 33  ALT 0 - 44 U/L 36 33 32    PET 10/26/19 IMPRESSION: 1. 11 mm central right lower lobe pulmonary nodule shows no hypermetabolism. While this is reassuring, well differentiated or low-grade neoplasm can be poorly FDG avid and close continued follow-up recommended. 2. Interval development of a 14 mm ill-defined nodular opacity in the posterior left lower lobe over the 9  day interval since prior CT. This shows low level FDG uptake and is almost assuredly atelectatic or infectious/inflammatory etiology. 3. Stable 1.7 cm right thyroid nodule without hypermetabolic FDG uptake on PET imaging. This has been documented on prior studies including thyroid ultrasound of 10/22/2019 (ref: J Am Coll Radiol. 2015 Feb;12(2): 143-50). 4. No findings on today's study to suggest hypermetabolic metastatic disease in the chest, abdomen, or pelvis. 5.  Aortic Atherosclerois (ICD10-170.0) 6. Hepatic steatosis.     PET 12/08/19  IMPRESSION: 1. Very low radiotracer activity associated with the RIGHT lobe pulmonary nodule is nonspecific. Findings would not be consistent with metastatic well-differentiated carcinoid tumor. Primary bronchial carcinoid can have low somatostatin receptor activity. Overall favor benign/indolent lesion. 2. No evidence of well differentiated neuroendocrine tumor within the abdomen pelvis.     VIDEO BRONCHOSCOPY WITH BIOPSIES by Dr Roxan Hockey 01/13/20  FINAL MICROSCOPIC DIAGNOSIS:   A. LUNG, RIGHT LOWER ENDOBRONCHIAL, BIOPSY:  - Benign bronchial tissue.      DIAGNOSIS: 02/03/20  BONE MARROW, ASPIRATE, CLOT, CORE:  -Normocellular bone marrow for age with trilineage hematopoiesis and 3%  plasma cells  -See comment   PERIPHERAL BLOOD:  -No significant morphologic abnormalities   COMMENT:  The bone marrow is generally normocellular for age with trilineage  hematopoiesis and orderly and progressive maturation of all myeloid cell  lines.  In this background, the plasma cells represent 3% of all cells  associated with scattered small clusters.  To further assess the plasma  cell clonality, CD138 immunohistochemistry and in situ hybridization for  kappa andlambda will be performed and the results reported in an  addendum.  Correlation with cytogenetic and FISH studies is recommended.      RADIOGRAPHIC STUDIES: I have personally reviewed  the radiological images as listed and agreed with the findings in the report. No results found.   ASSESSMENT & PLAN:  Sarah [name] is a 54 y.o. female with    1. 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 -Her Lung biopsy from 01/13/20 which was benign. -We discussed her 02/03/20 Bone Marrow biopsy which showed normocellular bone marrow for age with trilineage hematopoiesis and 3% plasma cell.  Immunohistochemical stain for CD138 and in situ hybridization for kappa lambda light chain results showed polyclonal pattern, which is likely represent a reactive process, no definitive evidence of multiple myeloma or monoclonal process. -The cytogenetics from bone marrow biopsy is still pending, anticipated with normal, I will call her next week when the result returns. -will see him as needed in future    2. Lung Nodules -She was seen to have 459mright lung nodule on 01/12/15 scan. Her PET scan from 5/18/21shows increase to 1193might lung nodule with no hypermetabolic uptake, a [id number] lung nodule with low uptake and 1.7cm right thyroid nodule without hypermetabolic uptake.  -Her repeated PET from 6/30/21was also indeterminate.  -Dr RosDenman Georgees not feel this is related to her prior ovarian cancer.  -Her video bronchoscopy with DrHendrickson on 01/13/20 was benign. She will f/u with Dr HenRoxan Hockeyd repeat scans in the future.   3. 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  -Ca 125 was elevated at diagnosis, normalized after surgery and chemo -last CT 11/2018 showed NED. She will continue to f/u with Dr RosDenman Georgeshe is reportedly UTD on age-appropriate cancer screenings. She is being followed for a likely benign right breast mass which was stable in 01/2019 on mammogram/US  4. Vitamin D deficiency -found on work up in 05/2019 -ContinueVitd supplement  5. HTN, HL, DM -Continue weight loss program, f/u with dietician and PCP     PLAN: -I reviewed her BM biopsy results, which is unremarkable. -I will see her back as needed, she will continue f/u with PCP Dr. JohWynetta Emeryd Dr. HenRoxan Hockey No problem-specific Assessment & Plan notes found for this encounter.   No orders of the defined types were placed in this encounter.  I discussed the assessment and treatment plan with the patient. The patient was provided an opportunity to ask questions and all were answered. The patient agreed with the plan and demonstrated an understanding of the instructions.  The patient was advised to call back or seek an in-person evaluation if the symptoms worsen or if the condition fails to improve as anticipated.  The total time spent in the appointment was 15 minutes.    YanTruitt MerleD 02/10/2020   I, AmoJoslyn Devonm acting as scribe for YanTruitt MerleD.   I have reviewed the above documentation for accuracy and completeness, and I agree with the above.

## 2020-02-10 ENCOUNTER — Encounter: Payer: Self-pay | Admitting: Hematology

## 2020-02-10 ENCOUNTER — Inpatient Hospital Stay: Payer: Medicare HMO | Attending: Nurse Practitioner | Admitting: Hematology

## 2020-02-10 DIAGNOSIS — D8989 Other specified disorders involving the immune mechanism, not elsewhere classified: Secondary | ICD-10-CM

## 2020-02-11 ENCOUNTER — Telehealth: Payer: Self-pay | Admitting: Hematology

## 2020-02-11 NOTE — Telephone Encounter (Signed)
No changes made to pt's schedule per 9/2 los.

## 2020-02-15 ENCOUNTER — Other Ambulatory Visit: Payer: Self-pay

## 2020-02-15 ENCOUNTER — Encounter (HOSPITAL_COMMUNITY): Payer: Self-pay | Admitting: Hematology

## 2020-02-15 ENCOUNTER — Encounter (INDEPENDENT_AMBULATORY_CARE_PROVIDER_SITE_OTHER): Payer: Self-pay | Admitting: Family Medicine

## 2020-02-15 ENCOUNTER — Ambulatory Visit (INDEPENDENT_AMBULATORY_CARE_PROVIDER_SITE_OTHER): Payer: Medicare HMO | Admitting: Family Medicine

## 2020-02-15 VITALS — BP 124/81 | HR 78 | Temp 97.8°F | Ht 65.0 in | Wt 228.0 lb

## 2020-02-15 DIAGNOSIS — I152 Hypertension secondary to endocrine disorders: Secondary | ICD-10-CM

## 2020-02-15 DIAGNOSIS — E559 Vitamin D deficiency, unspecified: Secondary | ICD-10-CM | POA: Diagnosis not present

## 2020-02-15 DIAGNOSIS — E119 Type 2 diabetes mellitus without complications: Secondary | ICD-10-CM | POA: Insufficient documentation

## 2020-02-15 DIAGNOSIS — Z794 Long term (current) use of insulin: Secondary | ICD-10-CM

## 2020-02-15 DIAGNOSIS — C561 Malignant neoplasm of right ovary: Secondary | ICD-10-CM | POA: Diagnosis not present

## 2020-02-15 DIAGNOSIS — Z6838 Body mass index (BMI) 38.0-38.9, adult: Secondary | ICD-10-CM | POA: Diagnosis not present

## 2020-02-15 DIAGNOSIS — G5793 Unspecified mononeuropathy of bilateral lower limbs: Secondary | ICD-10-CM | POA: Diagnosis not present

## 2020-02-15 DIAGNOSIS — R5383 Other fatigue: Secondary | ICD-10-CM | POA: Diagnosis not present

## 2020-02-15 DIAGNOSIS — I10 Essential (primary) hypertension: Secondary | ICD-10-CM | POA: Diagnosis not present

## 2020-02-15 DIAGNOSIS — F3289 Other specified depressive episodes: Secondary | ICD-10-CM | POA: Diagnosis not present

## 2020-02-15 DIAGNOSIS — R911 Solitary pulmonary nodule: Secondary | ICD-10-CM

## 2020-02-15 DIAGNOSIS — E1159 Type 2 diabetes mellitus with other circulatory complications: Secondary | ICD-10-CM | POA: Diagnosis not present

## 2020-02-15 DIAGNOSIS — R0602 Shortness of breath: Secondary | ICD-10-CM

## 2020-02-15 DIAGNOSIS — E785 Hyperlipidemia, unspecified: Secondary | ICD-10-CM

## 2020-02-15 DIAGNOSIS — Z0289 Encounter for other administrative examinations: Secondary | ICD-10-CM

## 2020-02-15 DIAGNOSIS — E1169 Type 2 diabetes mellitus with other specified complication: Secondary | ICD-10-CM

## 2020-02-15 NOTE — Progress Notes (Signed)
Dear Dr. Wynetta Emery,   Thank you for referring Sarah [name] to our clinic. The following note includes my evaluation and treatment recommendations.  Chief Complaint:   OBESITY Sarah [name] (MR# [id number]) is a 54 y.o. female who presents for evaluation and treatment of obesity and related comorbidities. Current BMI is Body mass index is 37.94 kg/m. Sarah [name] has been struggling with her weight for many years and has been unsuccessful in either losing weight, maintaining weight loss, or reaching her healthy weight goal.  Sarah [name] is currently in the action stage of change and ready to dedicate time achieving and maintaining a healthier weight. Shelly is interested in becoming our patient and working on intensive lifestyle modifications including (but not limited to) diet and exercise for weight loss.  Sarah [name] sees [name] in Oncology.  She recently had a bone marrow biopsy with a lung biopsy done by Cardiovascular Surgery.  All benign, she says.  She is, however, actively being worked-up by her PCP and others for hypercalcemia, which has been negative thus far.  Sarah [name] habits were reviewed today and are as follows: Her family eats meals together, she thinks her family will eat healthier with her, her desired weight loss is 32 pounds, she started gaining weight during her second pregnancy, her heaviest weight ever was 232 pounds, she has significant food cravings issues, she snacks frequently in the evenings, she skips lunch frequently, she frequently makes poor food choices, she frequently eats larger portions than normal and she struggles with emotional eating.  Depression Screen Sarah [name] Food and Mood (modified PHQ-9) score was 16.  Depression screen PHQ 2/9 02/15/2020  Decreased Interest 2  Down, Depressed, Hopeless 3  PHQ - 2 Score 5  Altered sleeping 3  Tired, decreased energy 3  Change in appetite 2  Feeling bad or failure about yourself  3  Trouble concentrating 0  Moving slowly  or fidgety/restless 0  Suicidal thoughts 0  PHQ-9 Score 16  Some recent data might be hidden    This is the patient's first visit at [facility].  The patient's NEW PATIENT PACKET that they filled out prior to today's office visit was reviewed at length and some information from that paperwork was also included within the following office visit note.    Included in the packet: current and past health history, medications, allergies, ROS, gynecologic history (women only), surgical history, family history, social history, weight history, weight loss surgery history (for those that have had weight loss surgery), nutritional evaluation, mood and food questionnaire along with a depression screening (PHQ9) on all patients, an Epworth questionnaire, sleep habits questionnaire, patient life and health improvement goals questionnaire. These will all be scanned into the patient's chart under media.   During the visit, I independently reviewed the patient's EKG, bioimpedance scale results, and indirect calorimeter results. I used this information to tailor a meal plan for the patient that will help Njeri to lose weight and will improve her obesity-related conditions going forward. I performed a medically necessary appropriate examination and/or evaluation. I discussed the assessment and treatment plan with the patient. The patient was provided an opportunity to ask questions and all were answered. The patient agreed with the plan and demonstrated an understanding of the instructions. Labs were ordered at this visit and will be reviewed at the next visit unless more critical results need to be addressed immediately. Clinical information was updated and documented in the EMR.   Time spent on visit including pre-visit  chart review and post-visit care was estimated to be 60-74 minutes.     Subjective:   1. Other fatigue Sarah [name] denies daytime somnolence and reports waking up still tired. Sarah [name]  has a history of symptoms of morning fatigue and snoring. Sarah [name] generally gets 3 or 4 hours of sleep per night, and states that she has poor quality sleep. Snoring is present. Apneic episodes are not present. Epworth Sleepiness Score is 8.   2. Shortness of breath on exertion Sarah [name] notes increasing shortness of breath with exercising and seems to be worsening over time with weight gain. She notes getting out of breath sooner with activity than she used to. This has gotten worse recently. Sarah [name] denies shortness of breath at rest or orthopnea.  This is chronic in nature.  PCP gives her albuterol to use as needed.  She uses it twice weekly at the most, she says.  Worse in winter.  3. Type 2 diabetes mellitus with other specified complication, with long-term current use of insulin (HCC) Medications reviewed. Diabetic ROS: no polyuria or polydipsia, no chest pain, dyspnea or TIA's, no numbness, tingling or pain in extremities.  Positive insulin dependence as well.  Diagnosed around 5 years ago.  Management per PCP, Dr. Wynetta Emery.  On 01/11/2020, A1c was 9.0, and insulin was increased and Trulicity was added.  She is on Lantus, Trulicity, metformin, and glimepiride.  Lab Results  Component Value Date   HGBA1C 9.0 (H) 01/11/2020   HGBA1C 9.8 (A) 09/23/2019   HGBA1C 9.3 (A) 03/22/2019   Lab Results  Component Value Date   MICROALBUR 80 11/13/2016   LDLCALC 80 12/04/2018   CREATININE 1.10 (H) 01/11/2020   4. Hypertension associated with diabetes ([place]) Review: taking medications as instructed, no medication side effects noted, no chest pain on exertion, no dyspnea on exertion, no swelling of ankles.  Diagnosed 10 years or so ago.  Controlled at home.  She is on losartan, metoprolol, amlodipine, HCTZ, hydralazine.  CMP recently done on 01/11/2020.  BP Readings from Last 3 Encounters:  02/15/20 124/81  02/03/20 115/73  02/01/20 137/87   5. Hyperlipidemia associated with type 2 diabetes mellitus  ([place]) Sarah [name] has hyperlipidemia and has been trying to improve her cholesterol levels with intensive lifestyle modification including a low saturated fat diet, exercise and weight loss. She denies any chest pain, claudication or myalgias.  She is taking Crestor.  Lab Results  Component Value Date   ALT 36 01/11/2020   AST 35 01/11/2020   ALKPHOS 53 01/11/2020   BILITOT 1.0 01/11/2020   Lab Results  Component Value Date   CHOL 166 12/04/2018   HDL 42 12/04/2018   LDLCALC 80 12/04/2018   TRIG 222 (H) 12/04/2018   CHOLHDL 4.0 12/04/2018   6. Ovarian cancer, right ([place]) Total hysterectomy in 2015 or so.  She sees GYN now to continue female care - Dr. [name].  She has history of chemo-induced neuropathy, for which she takes gabapentin.  7. Right lower lobe pulmonary nodule Status post bronchoscopy with biopsy by Dr. Roxan Hockey of Pulmonary Surgery.  They feel it is benign, but she is under surveillance at regular intervals.  8. Other depression, with emotional eating Positive screening questions.  She never thinks she needs medications to help with her mood.  Denies SI.  Assessment/Plan:   1. Other fatigue Sarah [name] does feel that her weight is causing her energy to be lower than it should be. Fatigue may be related to obesity, depression or many other causes.  Labs will be ordered, and in the meanwhile, Sarah [name] will focus on self care including making healthy food choices, increasing physical activity and focusing on stress reduction.  - EKG 12-Lead - Vitamin B12 - T4, free - TSH - VITAMIN D 25 Hydroxy (Vit-D Deficiency, Fractures) - Folate  2. Shortness of breath on exertion Sarah [name] does feel that she gets out of breath more easily that she used to when she exercises. Sarah [name] shortness of breath appears to be obesity related and exercise induced. She has agreed to work on weight loss and gradually increase exercise to treat her exercise induced shortness of breath. Will continue to  monitor closely.  - Vitamin B12 - Folate  3. Type 2 diabetes mellitus with other specified complication, with long-term current use of insulin (HCC) Good blood sugar control is important to decrease the likelihood of diabetic complications such as nephropathy, neuropathy, limb loss, blindness, coronary artery disease, and death. Intensive lifestyle modification including diet, exercise and weight loss are the first line of treatment for diabetes.  Check labs.  A1c not at goal/poorly controlled.  Medications recently changed by her PCP.  Closely monitor blood sugar levels, close follow-up!  - Lipid Panel With LDL/HDL Ratio  4. Hypertension associated with diabetes ([place]) Sarah [name] is working on healthy weight loss and exercise to improve blood pressure control. We will watch for signs of hypotension as she continues her lifestyle modifications.  At goal today.  Check labs, close monitoring needed.  - Lipid Panel With LDL/HDL Ratio - T4, free - TSH  5. Hyperlipidemia associated with type 2 diabetes mellitus ([place]) Cardiovascular risk and specific lipid/LDL goals reviewed.  We discussed several lifestyle modifications today and Sarah [name] will continue to work on diet, exercise and weight loss efforts. Orders and follow up as documented in patient record.  Check labs, continue medications.  Counseling Intensive lifestyle modifications are the first line treatment for this issue. . Dietary changes: Increase soluble fiber. Decrease simple carbohydrates. . Exercise changes: Moderate to vigorous-intensity aerobic activity 150 minutes per week if tolerated. . Lipid-lowering medications: see documented in medical record. .  - Lipid Panel With LDL/HDL Ratio  6. Ovarian cancer, right [facility]) Continue with Oncology and PCP.  7. Right lower lobe pulmonary nodule This causes her to be short of breath more than usual, but it is not problematic.  8. Other depression, with emotional eating Patient was  referred to Dr. Mallie Mussel, our Bariatric Psychologist, for evaluation due to her elevated PHQ-9 score and significant struggles with emotional eating.  9. Class 2 severe obesity with serious comorbidity and body mass index (BMI) of 38.0 to 38.9 in adult, unspecified obesity type (HCC) Sarah [name] is currently in the action stage of change and her goal is to continue with weight loss efforts. I recommend Veryl begin the structured treatment plan as follows:  She has agreed to the Category 1 Plan.  Exercise goals: All adults should avoid inactivity. Some physical activity is better than none, and adults who participate in any amount of physical activity gain some health benefits.   Behavioral modification strategies: decreasing liquid calories, meal planning and cooking strategies and planning for success.  She was informed of the importance of frequent follow-up visits to maximize her success with intensive lifestyle modifications for her multiple health conditions. She was informed we would discuss her lab results at her next visit unless there is a critical issue that needs to be addressed sooner. Sarah [name] agreed to keep her next visit at the agreed upon  time to discuss these results.  Objective:   Blood pressure 124/81, pulse 78, temperature 97.8 F (36.6 C), height _0  (1.651 m), weight 228 lb (103.4 kg), SpO2 97 %. Body mass index is 37.94 kg/m.  EKG: Normal sinus rhythm, rate 76 bpm.  Indirect Calorimeter completed today shows a VO2 of 180 and a REE of 1781.  Her calculated basal metabolic rate is 0626 thus her basal metabolic rate is better than expected.  General: Cooperative, alert, well developed, in no acute distress. HEENT: Conjunctivae and lids unremarkable. Cardiovascular: Regular rhythm.  Lungs: Normal work of breathing. Neurologic: No focal deficits.   Lab Results  Component Value Date   CREATININE 1.10 (H) 01/11/2020   BUN 17 01/11/2020   NA 137 01/11/2020   K 3.9  01/11/2020   CL 101 01/11/2020   CO2 24 01/11/2020   Lab Results  Component Value Date   ALT 36 01/11/2020   AST 35 01/11/2020   ALKPHOS 53 01/11/2020   BILITOT 1.0 01/11/2020   Lab Results  Component Value Date   HGBA1C 9.0 (H) 01/11/2020   HGBA1C 9.8 (A) 09/23/2019   HGBA1C 9.3 (A) 03/22/2019   HGBA1C 10.8 (H) 12/04/2018   HGBA1C 9.6 (H) 08/28/2018   Lab Results  Component Value Date   TSH 0.977 04/29/2019   Lab Results  Component Value Date   CHOL 166 12/04/2018   HDL 42 12/04/2018   LDLCALC 80 12/04/2018   TRIG 222 (H) 12/04/2018   CHOLHDL 4.0 12/04/2018   Lab Results  Component Value Date   WBC 6.9 02/03/2020   HGB 13.3 02/03/2020   HCT 41.1 02/03/2020   MCV 92.2 02/03/2020   PLT 270 02/03/2020   Lab Results  Component Value Date   IRON 43 08/18/2010   TIBC 389 08/18/2010   FERRITIN 227 08/18/2010   Obesity Behavioral Intervention Visit Documentation for Insurance:   Approximately 15 minutes were spent on the discussion below.  ASK: We discussed the diagnosis of obesity with Sarah [name] today and Sarah [name] agreed to give [place] permission to discuss obesity behavioral modification therapy today.  ASSESS: Sarah [name] has the diagnosis of obesity and her BMI today is 38.1. Taralynn is in the action stage of change.   ADVISE: Sarah [name] was educated on the multiple health risks of obesity as well as the benefit of weight loss to improve her health. She was advised of the need for long term treatment and the importance of lifestyle modifications to improve her current health and to decrease her risk of future health problems.  AGREE: Multiple dietary modification options and treatment options were discussed and Sugar agreed to follow the recommendations documented in the above note.  ARRANGE: Aleysha was educated on the importance of frequent visits to treat obesity as outlined per CMS and USPSTF guidelines and agreed to schedule her next follow up appointment  today.  Attestation Statements:   Reviewed by clinician on day of visit: allergies, medications, problem list, medical history, surgical history, family history, social history, and previous encounter notes.  I, Water quality scientist, CMA, am acting as Location manager for [employer], DO.  I have reviewed the above documentation for accuracy and completeness, and I agree with the above. Mellody Dance, DO

## 2020-02-15 NOTE — Progress Notes (Signed)
Office: [phone]  /  Fax: [phone]    Date: February 17, 2020   Appointment Start Time: 8:43am Duration: 31 minutes Provider: Glennie Isle, Psy.D. Type of Session: Intake for Individual Therapy  Location of Patient: Home Location of Provider: Provider's Home Type of Contact: Telepsychological Visit via MyChart Video Visit  Informed Consent: Prior to proceeding with today's appointment, two pieces of identifying information were obtained. In addition, Sarah [name]'s physical location at the time of this appointment was obtained as well a phone number she could be reached at in the event of technical difficulties. Sarah [name] and this provider participated in today's telepsychological service.   The provider's role was explained to Sarah [name]. The provider reviewed and discussed issues of confidentiality, privacy, and limits therein (e.g., reporting obligations). In addition to verbal informed consent, written informed consent for psychological services was obtained prior to the initial appointment. Since the clinic is not a 24/7 crisis center, mental health emergency resources were shared and this  provider explained MyChart, e-mail, voicemail, and/or other messaging systems should be utilized only for non-emergency reasons. This provider also explained that information obtained during appointments will be placed in [place] record and relevant information will be shared with other providers at [facility] for coordination of care. Moreover, Sarah [name] agreed information may be shared with other [facility] providers as needed for coordination of care. By signing the service agreement document, Sarah [name] provided written consent for coordination of care. Prior to initiating telepsychological services, Tatia completed an informed consent document, which included the development of a safety plan (i.e., an emergency contact, nearest emergency room, and emergency resources)  in the event of an emergency/crisis. Sarah [name] expressed understanding of the rationale of the safety plan. Sarah [name] verbally acknowledged understanding she is ultimately responsible for understanding her insurance benefits for telepsychological and in-person services. This provider also reviewed confidentiality, as it relates to telepsychological services, as well as the rationale for telepsychological services (i.e., to reduce exposure risk to COVID-19). Sarah [name]  acknowledged understanding that appointments cannot be recorded without both party consent and she is aware she is responsible for securing confidentiality on her end of the session. Sarah [name] verbally consented to proceed.  Chief Complaint/HPI: Sarah [name] was referred by Dr. Mellody Dance due to other depression, with emotional eating. Per the note for the initial visit with Dr. Mellody Dance on February 15, 2020, "Positive screening questions.  She never thinks she needs medications to help with her mood.  Denies SI." The note for the initial appointment with Dr. Mellody Dance indicated the following: "Devanie's habits were reviewed today and are as follows: Her family eats meals together, she thinks her family will eat healthier with her, her desired weight loss is 32 pounds, she started gaining weight during her second pregnancy, her heaviest weight ever was 232 pounds, she has significant food cravings issues, she snacks frequently in the evenings, she skips lunch frequently, she frequently makes poor food choices, she frequently eats larger portions than normal and she struggles with emotional eating." Sarah [name]'s Food and Mood (modified PHQ-9) score on February 15, 2020 was 16.  During today's appointment, Sarah [name] was verbally administered a questionnaire assessing various behaviors related to emotional eating. Sarah [name] endorsed the following: overeat when you are celebrating, experience food cravings on a regular basis, eat certain foods when you are  anxious, stressed, depressed, or your feelings are hurt, use food to help you cope with emotional situations, find food is comforting to you, overeat when you are  angry or upset, overeat when you are worried about something, overeat frequently when you are bored or lonely, not worry about what you eat when you are in a good mood, overeat when you are angry at someone just to show them they cannot control you, overeat when you are alone, but eat much less when you are with other people, eat to help you stay awake and eat as a reward. She shared she craves potato chips, potatoes, sea food, and fried foods. Sarah [name] believes the onset of emotional eating was likely 20 years ago and described the current frequency of emotional eating as "daily." In addition, Sarah [name] denied a history of binge eating behaviors. Sarah [name] denied a history of restricting food intake, purging and engagement in other compensatory strategies, and has never been diagnosed with an eating disorder. She also denied a history of treatment for emotional eating. Currently, Sarah [name] indicated feeling upset triggers emotional eating, whereas staying busy (taking care of loved ones) makes emotional eating better. Furthermore, Sarah [name] discussed an exacerbation in emotional eating when she separated from her husband three years ago, adding she would eat for comfort.   Mental Status Examination:  Appearance: well groomed and appropriate hygiene  Behavior: appropriate to circumstances Mood: euthymic Affect: mood congruent Speech: normal in rate, volume, and tone Eye Contact: appropriate Psychomotor Activity: appropriate Gait: unable to assess Thought Process: linear, logical, and goal directed  Thought Content/Perception: denies suicidal and homicidal ideation, plan, and intent and no hallucinations, delusions, bizarre thinking or behavior reported or observed Orientation: time, person, place, and purpose of appointment Memory/Concentration: memory,  attention, language, and fund of knowledge intact  Insight/Judgment: good  Family & Psychosocial History: Sarah [name] reported she separated from her husband three years ago and she has two adult children. She indicated she is currently on disability. Additionally, Sarah [name] shared her highest level of education obtained is a high school diploma. Currently, Sarah [name]'s social support system consists of her mother, sister, and husband. Moreover, Sarah [name] stated she resides with her mother.   Medical History:  Past Medical History:  Diagnosis Date  . Arthritis   . Diabetes mellitus without complication ([place])   . Family history of adverse reaction to anesthesia    sister-n/v  . Family history of colon cancer   . GERD (gastroesophageal reflux disease)   . Glaucoma 02/16  . Glucagonoma 07/28/14   Pt denies this but states she has glaucoma  . History of chemotherapy   . History of hiatal hernia   . Hypertension   . Imbalance   . Neuropathy    feet bilat  . Nocturia   . Peritoneal carcinomatosis ([place])    carcinoma of ovary   . Shortness of breath dyspnea    hx of 2013 - no problems currently   . Sleep apnea   . Thyroid nodule    history of   . Wears glasses    Past Surgical History:  Procedure Laterality Date  . ABDOMINAL HYSTERECTOMY    . FOOT SURGERY  04/2018   [facility]  . INCISIONAL HERNIA REPAIR N/A 07/06/2015   Procedure: INCISIONAL HERNIA REPAIR ;  Surgeon: Rolm Bookbinder, MD;  Location: [facility];  Service: General;  Laterality: N/A;  . INSERTION OF MESH N/A 07/06/2015   Procedure: WITH INSERTION OF PHASIX ST MESH;  Surgeon: Rolm Bookbinder, MD;  Location: [facility];  Service: General;  Laterality: N/A;  . LAPAROTOMY N/A 07/06/2015   Procedure: EXPLORATORY LAPAROTOMY;  Surgeon: Everitt Amber, MD;  Location: [facility];  Service: Gynecology;  Laterality: N/A;  . LYSIS OF ADHESION N/A 07/06/2015   Procedure:  LYSIS OF ADHESION RESECTION OF MESENTERIC MASS BOWEL RESECTION ;  Surgeon: Everitt Amber, MD;  Location: [facility];  Service: Gynecology;  Laterality: N/A;  . ROBOTIC ASSISTED TOTAL HYSTERECTOMY WITH BILATERAL SALPINGO OOPHERECTOMY Bilateral 07/28/2014   Procedure: ROBOTIC ASSISTED lysis of adhesions with biopsies, converted to LAPAROTOMY, bilateral salpingoorphorectomy, omentectomy,appendectomy;  Surgeon: Janie Morning, MD;  Location: [facility];  Service: Gynecology;  Laterality: Bilateral;  . THYROIDECTOMY, PARTIAL    . VIDEO BRONCHOSCOPY N/A 01/13/2020   Procedure: VIDEO BRONCHOSCOPY WITH BIOPSIES;  Surgeon: Melrose Nakayama, MD;  [facility];  Service: Thoracic;  Laterality: N/A;   Current Outpatient Medications on File Prior to Visit  Medication Sig Dispense Refill  . albuterol (VENTOLIN HFA) 108 (90 Base) MCG/ACT inhaler Inhale 2 puffs into the lungs every 6 (six) hours as needed for wheezing or shortness of breath. 8 g 0  . amLODipine (NORVASC) 10 MG tablet Take 1 tablet (10 mg total) by mouth daily. 90 tablet 3  . Blood Glucose Monitoring Suppl (ACCU-CHEK AVIVA PLUS) w/Device KIT USE AS DIRECTED 1 kit 0  . Blood Glucose Monitoring Suppl (ACCU-CHEK AVIVA) device Use as instructed 1 each 0  . Cholecalciferol (VITAMIN D3) 10 MCG (400 UNIT) tablet Take 400 Units by mouth once a week.    . diclofenac sodium (VOLTAREN) 1 % GEL Apply 2 g topically 4 (four) times daily. (Patient taking differently: Apply 2 g topically 4 (four) times daily as needed (back pain.). ) 100 g 3  . Dulaglutide (TRULICITY) 2.50 NL/9.7QB SOPN Inject 0.5 mLs (0.75 mg total) into the skin once a week. 6 mL 3  . gabapentin (NEURONTIN) 600 MG tablet Take 1 tablet (600 mg total) by mouth 3 (three) times daily. 270 tablet 3  . glimepiride (AMARYL) 4 MG tablet Take 1 tablet (4 mg total) by mouth daily with breakfast. 90 tablet 3  . glucose blood (ACCU-CHEK AVIVA PLUS) test strip Use as instructed 100 each 12  . hydrALAZINE (APRESOLINE) 25 MG tablet Take 1.5 tablets (37.5 mg total) by mouth 3 (three) times  daily. 405 tablet 3  . hydrochlorothiazide (HYDRODIURIL) 25 MG tablet Take 0.5 tablets (12.5 mg total) by mouth daily. 90 tablet 3  . insulin glargine (LANTUS SOLOSTAR) 100 UNIT/ML Solostar Pen INJECT 30 UNITS DAILY. INCREASE BY 2 UNITS EVERY 3 DAYS UNTIL MORNING SUGAR IS LESS THAN 130. MAX OF 40 UNITS. 45 mL 1  . Insulin Pen Needle 29G X 12MM MISC Use as directed. 100 each 6  . Lancets (ACCU-CHEK MULTICLIX) lancets   0  . Lancets (ACCU-CHEK SOFT TOUCH) lancets Use as instructed 100 each 12  . Lancets Misc. (ACCU-CHEK SOFTCLIX LANCET DEV) KIT Use as directed 1 kit 0  . losartan (COZAAR) 50 MG tablet Take 1 tablet (50 mg total) by mouth daily. 90 tablet 3  . meloxicam (MOBIC) 15 MG tablet TAKE 1 TABLET EVERY DAY 90 tablet 1  . metFORMIN (GLUCOPHAGE) 1000 MG tablet Take 1,000 mg by mouth 2 (two) times daily.    . metoprolol tartrate (LOPRESSOR) 25 MG tablet TAKE 1/2 TABLET BY MOUTH 2 TIMES A DAY (Patient taking differently: Take 12.5 mg by mouth 2 (two) times daily. ) 90 tablet 3  . rosuvastatin (CRESTOR) 20 MG tablet Take 1 tablet (20 mg total) by mouth daily. Stop Atorvastatin 90 tablet 3  . timolol (BETIMOL) 0.5 % ophthalmic solution Place 1 drop into both  eyes daily.    . Travoprost, BAK Free, (TRAVATAN) 0.004 % SOLN ophthalmic solution Place 1 drop into both eyes at bedtime.    . [DISCONTINUED] blood glucose meter kit and supplies Dispense based on patient and insurance preference. Use up to four times daily as directed. (FOR ICD-9 250.00, 250.01). 1 each 0   No current facility-administered medications on file prior to visit.  Jaimie denied a history of head injuries and loss of consciousness.   Mental Health History: Sallye denied a history of therapeutic and psychiatric services. Shaterra reported there is no history of hospitalizations for psychiatric concerns. Bina denied a family history of mental health related concerns. Sarah [name] reported there is no history of trauma including  psychological, physical  and sexual abuse, as well as neglect.   Sarah [name] described her typical mood lately as "up and down, rollercoaster." More specifically, she explained fluctuations in her mood started "20 years ago," adding her husband is a "recovering addict." She explained working and focusing on raising her children helped at the time to cope. Additionally, Sarah [name] indicated she moved back home after she separated resulting in her "feeling like a failure." Moreover, she shared about ongoing medical concerns. Aside from concerns noted above and endorsed on the PHQ-9 and GAD-7, Sarah [name] reported experiencing decreased motivation; crying spells; and worry thoughts about "everything." Sarah [name] denied current alcohol use. She denied tobacco use. She denied illicit/recreational substance use. Regarding caffeine intake, Katisha reported consuming iced tea "maybe once a month." Furthermore, Jazell indicated she is not experiencing the following: hallucinations and delusions, paranoia, symptoms of mania , social withdrawal and panic attacks. She also denied history of and current suicidal ideation, plan, and intent; history of and current homicidal ideation, plan, and intent; and history of and current engagement in self-harm.  The following strengths were reported by [name]: helping people and strong in religion . The following strengths were observed by this provider: ability to express thoughts and feelings during the therapeutic session, ability to establish and benefit from a therapeutic relationship, willingness to work toward established goal(s) with the clinic and ability to engage in reciprocal conversation.   Legal History: Dariann reported there is no history of legal involvement.   Structured Assessments Results: The Patient Health Questionnaire-9 (PHQ-9) is a self-report measure that assesses symptoms and severity of depression over the course of the last two weeks. Vale obtained a score of 8  suggesting mild depression. Keymani finds the endorsed symptoms to be somewhat difficult. [0= Not at all; 1= Several days; 2= More than half the days; 3= Nearly every day] Little interest or pleasure in doing things 0  Feeling down, depressed, or hopeless 2  Trouble falling or staying asleep, or sleeping too much 1  Feeling tired or having little energy 1  Poor appetite or overeating 2  Feeling bad about yourself --- or that you are a failure or have let yourself or your family down 2  Trouble concentrating on things, such as reading the newspaper or watching television 0  Moving or speaking so slowly that other people could have noticed? Or the opposite --- being so fidgety or restless that you have been moving around a lot more than usual 0  Thoughts that you would be better off dead or hurting yourself in some way 0  PHQ-9 Score 8    The Generalized Anxiety Disorder-7 (GAD-7) is a brief self-report measure that assesses symptoms of anxiety over the course of the last two weeks. Calvary obtained a score  of 8 suggesting mild anxiety. Shams finds the endorsed symptoms to be not difficult at all. [0= Not at all; 1= Several days; 2= Over half the days; 3= Nearly every day] Feeling nervous, anxious, on edge 2  Not being able to stop or control worrying 1  Worrying too much about different things 1  Trouble relaxing 2  Being so restless that it's hard to sit still 0  Becoming easily annoyed or irritable 2  Feeling afraid as if something awful might happen 0  GAD-7 Score 8   Interventions:  Conducted a chart review Focused on rapport building Verbally administered PHQ-9 and GAD-7 for symptom monitoring Verbally administered Food & Mood questionnaire to assess various behaviors related to emotional eating Provided emphatic reflections and validation Collaborated with patient on a treatment goal  Psychoeducation provided regarding physical versus emotional hunger Recommended/discussed option  for longer-term therapeutic services  Provisional DSM-5 Diagnosis(es): 311 (F32.8) Other Specified Depressive Disorder, Emotional Eating Behaviors and 300.00 (F41.9) Unspecified Anxiety Disorder   Plan: Wyatt appears able and willing to participate as evidenced by collaboration on a treatment goal, engagement in reciprocal conversation, and asking questions as needed for clarification. Longer-term/traditional therapeutic services were recommended based on Rayleigh's self-report. She reported ambivalence about the aforementioned, noting, "I'm not comfortable." Nevertheless, Jiah was receptive to meeting with this provider to address eating concerns at this time and was open to discussing longer-term therapeutic services again in the future. The next appointment will be scheduled in two weeks, which will be via MyChart Video Visit. The following treatment goal was established: increase coping skills. This provider will regularly review the treatment plan and medical chart to keep informed of status changes. Gizelle expressed understanding and agreement with the initial treatment plan of care. Trine will be sent a handout via e-mail to utilize between now and the next appointment to increase awareness of hunger patterns and subsequent eating. Agape provided verbal consent during today's appointment for this provider to send the handout via e-mail.

## 2020-02-16 ENCOUNTER — Encounter (INDEPENDENT_AMBULATORY_CARE_PROVIDER_SITE_OTHER): Payer: Self-pay | Admitting: Family Medicine

## 2020-02-16 LAB — LIPID PANEL WITH LDL/HDL RATIO
Cholesterol, Total: 164 mg/dL (ref 100–199)
HDL: 39 mg/dL — ABNORMAL LOW (ref >39)
LDL Chol Calc (NIH): 87 mg/dL (ref 0–99)
LDL/HDL Ratio: 2.2 ratio (ref 0.0–3.2)
Triglycerides: 227 mg/dL — ABNORMAL HIGH (ref 0–149)
VLDL Cholesterol Cal: 38 mg/dL (ref 5–40)

## 2020-02-16 LAB — VITAMIN D 25 HYDROXY (VIT D DEFICIENCY, FRACTURES): Vit D, 25-Hydroxy: 38.2 ng/mL (ref 30.0–100.0)

## 2020-02-16 LAB — VITAMIN B12: Vitamin B-12: 228 pg/mL — ABNORMAL LOW (ref 232–1245)

## 2020-02-16 LAB — TSH: TSH: 0.938 u[IU]/mL (ref 0.450–4.500)

## 2020-02-16 LAB — FOLATE: Folate: 10.5 ng/mL (ref >3.0)

## 2020-02-16 LAB — T4, FREE: Free T4: 1.29 ng/dL (ref 0.82–1.77)

## 2020-02-16 NOTE — Telephone Encounter (Signed)
Please advise 

## 2020-02-17 ENCOUNTER — Telehealth (INDEPENDENT_AMBULATORY_CARE_PROVIDER_SITE_OTHER): Payer: Medicare HMO | Admitting: Psychology

## 2020-02-17 ENCOUNTER — Encounter (INDEPENDENT_AMBULATORY_CARE_PROVIDER_SITE_OTHER): Payer: Self-pay | Admitting: Family Medicine

## 2020-02-17 DIAGNOSIS — F3289 Other specified depressive episodes: Secondary | ICD-10-CM | POA: Diagnosis not present

## 2020-02-17 DIAGNOSIS — F419 Anxiety disorder, unspecified: Secondary | ICD-10-CM

## 2020-02-17 NOTE — Progress Notes (Signed)
  Office: [phone]  /  Fax: [phone]    Date: March 02, 2020   Appointment Start Time: 9:53am Duration: 25 minutes Provider: Glennie Isle, Psy.D. Type of Session: Individual Therapy  Location of Patient: Home Location of Provider: Provider's Home Type of Contact: Telepsychological Visit via MyChart Video Visit  Session Content: Sarah [name] is a 54 y.o. female presenting for a follow-up appointment to address the previously established treatment goal of increasing coping skills. Today's appointment was a telepsychological visit due to COVID-19. Sarah [name] provided verbal consent for today's telepsychological appointment and she is aware she is responsible for securing confidentiality on her end of the session. Prior to proceeding with today's appointment, Sarah [name]'s physical location at the time of this appointment was obtained as well a phone number she could be reached at in the event of technical difficulties. Sarah [name] and this provider participated in today's telepsychological service.   This provider conducted a brief check-in. Sarah [name] reported she did "okay" with the meal plan, adding she lost three pounds. She also indicated she started working for [employer]. Sarah [name] discussed feeling worried that she was not doing the best she could based on recent choices. This was further explored and associated thoughts and feelings were processed. Reviewed emotional and physical hunger. Additionally, psychoeducation regarding triggers for emotional eating was provided. Sarah [name] was provided a handout, and encouraged to utilize the handout between now and the next appointment to increase awareness of triggers and frequency. Sarah [name] agreed. This provider also discussed behavioral strategies for specific triggers, such as placing the utensil down when conversing to avoid mindless eating. Sarah [name] provided verbal consent during today's appointment for this provider to send a handout about triggers via e-mail. Sarah [name]  was receptive to today's appointment as evidenced by openness to sharing, responsiveness to feedback, and willingness to explore triggers for emotional eating.  Mental Status Examination:  Appearance: well groomed and appropriate hygiene  Behavior: appropriate to circumstances Mood: euthymic Affect: mood congruent Speech: normal in rate, volume, and tone Eye Contact: appropriate Psychomotor Activity: appropriate Gait: unable to assess Thought Process: linear, logical, and goal directed  Thought Content/Perception: no hallucinations, delusions, bizarre thinking or behavior reported or observed and no evidence of suicidal and homicidal ideation, plan, and intent Orientation: time, person, place, and purpose of appointment Memory/Concentration: memory, attention, language, and fund of knowledge intact  Insight/Judgment: good  Interventions:  Conducted a brief chart review Provided empathic reflections and validation Reviewed content from the previous session Employed supportive psychotherapy interventions to facilitate reduced distress and to improve coping skills with identified stressors Psychoeducation provided regarding triggers for emotional eating  DSM-5 Diagnosis(es): 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 Sarah [name] has demonstrated progress in her goal as evidenced by increased awareness of hunger patterns.   Plan: The next appointment will be scheduled in two weeks, which will be via MyChart Video Visit. The next session will focus on working towards the established treatment goal.

## 2020-02-17 NOTE — Telephone Encounter (Signed)
Please advise 

## 2020-02-29 ENCOUNTER — Encounter (INDEPENDENT_AMBULATORY_CARE_PROVIDER_SITE_OTHER): Payer: Self-pay | Admitting: Family Medicine

## 2020-02-29 ENCOUNTER — Other Ambulatory Visit: Payer: Self-pay

## 2020-02-29 ENCOUNTER — Ambulatory Visit (INDEPENDENT_AMBULATORY_CARE_PROVIDER_SITE_OTHER): Payer: Medicare HMO | Admitting: Family Medicine

## 2020-02-29 VITALS — BP 123/85 | HR 75 | Temp 98.4°F | Ht 65.0 in | Wt 225.0 lb

## 2020-02-29 DIAGNOSIS — E1159 Type 2 diabetes mellitus with other circulatory complications: Secondary | ICD-10-CM

## 2020-02-29 DIAGNOSIS — E785 Hyperlipidemia, unspecified: Secondary | ICD-10-CM | POA: Diagnosis not present

## 2020-02-29 DIAGNOSIS — E559 Vitamin D deficiency, unspecified: Secondary | ICD-10-CM | POA: Diagnosis not present

## 2020-02-29 DIAGNOSIS — Z794 Long term (current) use of insulin: Secondary | ICD-10-CM | POA: Diagnosis not present

## 2020-02-29 DIAGNOSIS — E669 Obesity, unspecified: Secondary | ICD-10-CM

## 2020-02-29 DIAGNOSIS — I10 Essential (primary) hypertension: Secondary | ICD-10-CM

## 2020-02-29 DIAGNOSIS — E538 Deficiency of other specified B group vitamins: Secondary | ICD-10-CM

## 2020-02-29 DIAGNOSIS — Z6837 Body mass index (BMI) 37.0-37.9, adult: Secondary | ICD-10-CM

## 2020-02-29 DIAGNOSIS — I152 Hypertension secondary to endocrine disorders: Secondary | ICD-10-CM

## 2020-02-29 DIAGNOSIS — E1169 Type 2 diabetes mellitus with other specified complication: Secondary | ICD-10-CM | POA: Diagnosis not present

## 2020-02-29 DIAGNOSIS — Z6830 Body mass index (BMI) 30.0-30.9, adult: Secondary | ICD-10-CM | POA: Diagnosis not present

## 2020-02-29 MED ORDER — VITAMIN D (ERGOCALCIFEROL) 1.25 MG (50000 UNIT) PO CAPS: 50000.0000 [IU] | ORAL_CAPSULE | ORAL | 0 refills | Status: DC

## 2020-02-29 MED ORDER — CYANOCOBALAMIN 500 MCG PO TABS: 500.0000 ug | ORAL_TABLET | Freq: Every day | ORAL | Status: DC

## 2020-03-01 ENCOUNTER — Encounter (INDEPENDENT_AMBULATORY_CARE_PROVIDER_SITE_OTHER): Payer: Self-pay | Admitting: Family Medicine

## 2020-03-01 NOTE — Telephone Encounter (Signed)
Please advise 

## 2020-03-02 ENCOUNTER — Telehealth (INDEPENDENT_AMBULATORY_CARE_PROVIDER_SITE_OTHER): Payer: Medicare HMO | Admitting: Psychology

## 2020-03-02 DIAGNOSIS — F419 Anxiety disorder, unspecified: Secondary | ICD-10-CM | POA: Diagnosis not present

## 2020-03-02 DIAGNOSIS — F3289 Other specified depressive episodes: Secondary | ICD-10-CM

## 2020-03-02 NOTE — Progress Notes (Signed)
Chief Complaint:   OBESITY Sarah [name] is here to discuss her progress with her obesity treatment plan along with follow-up of her obesity related diagnoses. Sarah [name] is on the Category 1 Plan and states she is following her eating plan approximately 90% of the time. Sarah [name] states she is doing Doctor, general practice for 60 minutes 4 times per week.  Today's visit was #: 2 Starting weight: 228 lbs Starting date: 02/15/2020 Today's weight: 225 lbs Today's date: 02/29/2020 Total lbs lost to date: 3 lbs Total lbs lost since last in-office visit: 3 lbs  Interim History:  - Sarah [name] is tired of eggs and does not like sandwiches everyday.   - She says she hates yogurt.   - About 10% of the time, she did not eat and that is why she only followed the plan "90% of the time".  She denies eating off plan, just not eating all her foods.  Has no concerns or complaints   Subjective:   1. Type 2 diabetes mellitus with other specified complication, with long-term current use of insulin (HCC) Medications reviewed. Diabetic ROS: no polyuria or polydipsia, no chest pain, dyspnea or TIA's, no numbness, tingling or pain in extremities.  FBS have been dropping.  At one time, she woke with FBS of 60 and felt "draggy".  She called her PCP, who decreased her insulin from 50 units to 46.  Now, her sugars are running in the 110s.  Lab Results  Component Value Date   HGBA1C 9.0 (H) 01/11/2020   HGBA1C 9.8 (A) 09/23/2019   HGBA1C 9.3 (A) 03/22/2019   Lab Results  Component Value Date   MICROALBUR 80 11/13/2016   LDLCALC 87 02/15/2020   CREATININE 1.10 (H) 01/11/2020   2. Hyperlipidemia associated with type 2 diabetes mellitus ([place]) Cari has hyperlipidemia and has been trying to improve her cholesterol levels with intensive lifestyle modification including a low saturated fat diet, exercise and weight loss. She denies any chest pain, claudication or myalgias.  She is taking Crestor 20 mg daily.  Tolerating well without side  effects.  Lab Results  Component Value Date   ALT 36 01/11/2020   AST 35 01/11/2020   ALKPHOS 53 01/11/2020   BILITOT 1.0 01/11/2020   Lab Results  Component Value Date   CHOL 164 02/15/2020   HDL 39 (L) 02/15/2020   LDLCALC 87 02/15/2020   TRIG 227 (H) 02/15/2020   CHOLHDL 4.0 12/04/2018   3. Hypertension associated with diabetes ([place]) Review: taking medications as instructed, no medication side effects noted, no chest pain on exertion, no dyspnea on exertion, no swelling of ankles.  Denies concerns.  Tolerating all medications well and being managed closely.  BP Readings from Last 3 Encounters:  02/29/20 123/85  02/15/20 124/81  02/03/20 115/73   4. Vitamin D deficiency Sarah [name]'s Vitamin D level was 38.2 on 02/15/2020. She is currently taking prescription vitamin D 50,000 IU each week. She denies nausea, vomiting or muscle weakness.  Vitamin D is not at goal of >50 despite being on supplementation per PCP.  Tolerating well without side effects.  5. B12 deficiency She is not a vegetarian.  She does not have a previous diagnosis of pernicious anemia.  She does not have a history of weight loss surgery.  She endorses fatigue.  Lab Results  Component Value Date   VITAMINB12 228 (L) 02/15/2020   Assessment/Plan:   1. Type 2 diabetes mellitus with other specified complication, with long-term current use of insulin ([place])  Discussed labs with patient today.  Good blood sugar control is important to decrease the likelihood of diabetic complications such as nephropathy, neuropathy, limb loss, blindness, coronary artery disease, and death. Intensive lifestyle modification including diet, exercise and weight loss are the first line of treatment for diabetes.  She understands that she can lower her insulin dose by 2-3 units per day each time she notices FBS going less than 90.  2. Hyperlipidemia associated with type 2 diabetes mellitus ([place]) Discussed labs with patient today.   Cardiovascular risk and specific lipid/LDL goals reviewed.  We discussed several lifestyle modifications today and Novella will continue to work on diet, exercise and weight loss efforts. Orders and follow up as documented in patient record.  LDL not quite at goal of <70.  Weight loss, prudent nutritional plan.  Recheck labs in 3-4 months or so.  Counseling Intensive lifestyle modifications are the first line treatment for this issue.  Dietary changes: Increase soluble fiber. Decrease simple carbohydrates.  Exercise changes: Moderate to vigorous-intensity aerobic activity 150 minutes per week if tolerated.  Lipid-lowering medications: see documented in medical record.  3. Hypertension associated with diabetes ([place]) Discussed labs with patient today.  Sarah [name] is working on healthy weight loss and exercise to improve blood pressure control. We will watch for signs of hypotension as she continues her lifestyle modifications.  Blood pressure is at goal.  Continue medications, prudent nutritional plan, weight loss.  4. Vitamin D deficiency Discussed labs with patient today.  Low Vitamin D level contributes to fatigue and are associated with obesity, breast, and colon cancer. She agrees to continue to take prescription Vitamin D @50 ,000 IU every week and will follow-up for routine testing of Vitamin D, at least 2-3 times per year to avoid over-replacement.  Recheck level in 3-4 months.  Weight loss and prudent nutritional plan.  -Refill Vitamin D, Ergocalciferol, (DRISDOL) 1.25 MG (50000 UNIT) CAPS capsule; Take 1 capsule (50,000 Units total) by mouth every 7 (seven) days.  Dispense: 4 capsule; Refill: 0  5. B12 deficiency Discussed labs with patient today.  The diagnosis was reviewed with the patient. Counseling provided today, see below. We will continue to monitor. Orders and follow up as documented in patient record.  Add 500 mcg B12 daily.  She will get OTC and understands dose to look for.   Continue prudent nutritional plan as well.  Counseling  The body needs vitamin B12: to make red blood cells; to make DNA; and to help the nerves work properly so they can carry messages from the brain to the body.   The main causes of vitamin B12 deficiency include dietary deficiency, digestive diseases, pernicious anemia, and having a surgery in which part of the stomach or small intestine is removed.   Certain medicines can make it harder for the body to absorb vitamin B12. These medicines include: heartburn medications; some antibiotics; some medications used to treat diabetes, gout, and high cholesterol.   In some cases, there are no symptoms of this condition. If the condition leads to anemia or nerve damage, various symptoms can occur, such as weakness or fatigue, shortness of breath, and numbness or tingling in your hands and feet.    Treatment:  o May include taking vitamin B12 supplements.  o Avoid alcohol.  o Eat lots of healthy foods that contain vitamin B12: - Beef, pork, chicken, [place], and organ meats, such as liver.  - Seafood: This includes clams, rainbow trout, salmon, tuna, and haddock. Eggs.  - Cereal  and dairy products that are fortified: This means that vitamin B12 has been added to the food.   -Start vitamin B-12 (CYANOCOBALAMIN) 500 MCG tablet; Take 1 tablet (500 mcg total) by mouth daily.  6. Class 2 severe obesity with serious comorbidity and body mass index (BMI) of 37.0 to 37.9 in adult, unspecified obesity type (HCC) Maie is currently in the action stage of change. As such, her goal is to continue with weight loss efforts. She has agreed to the Category 1 Plan, but gave her breakfast options and lunch options.  She was also given protein equivalents sheet.   Exercise goals: As is.  Behavioral modification strategies: increasing lean protein intake, decreasing simple carbohydrates, increasing water intake, no skipping meals, meal planning and cooking  strategies and planning for success.  Tennie has agreed to follow-up with our clinic in 2 weeks. She was informed of the importance of frequent follow-up visits to maximize her success with intensive lifestyle modifications for her multiple health conditions.   Objective:   Blood pressure 123/85, pulse 75, temperature 98.4 F (36.9 C), height 5\' 5"  (1.651 m), weight 225 lb (102.1 kg), SpO2 95 %. Body mass index is 37.44 kg/m.  General: Cooperative, alert, well developed, in no acute distress. HEENT: Conjunctivae and lids unremarkable. Cardiovascular: Regular rhythm.  Lungs: Normal work of breathing. Neurologic: No focal deficits.   Lab Results  Component Value Date   CREATININE 1.10 (H) 01/11/2020   BUN 17 01/11/2020   NA 137 01/11/2020   K 3.9 01/11/2020   CL 101 01/11/2020   CO2 24 01/11/2020   Lab Results  Component Value Date   ALT 36 01/11/2020   AST 35 01/11/2020   ALKPHOS 53 01/11/2020   BILITOT 1.0 01/11/2020   Lab Results  Component Value Date   HGBA1C 9.0 (H) 01/11/2020   HGBA1C 9.8 (A) 09/23/2019   HGBA1C 9.3 (A) 03/22/2019   HGBA1C 10.8 (H) 12/04/2018   HGBA1C 9.6 (H) 08/28/2018   Lab Results  Component Value Date   TSH 0.938 02/15/2020   Lab Results  Component Value Date   CHOL 164 02/15/2020   HDL 39 (L) 02/15/2020   LDLCALC 87 02/15/2020   TRIG 227 (H) 02/15/2020   CHOLHDL 4.0 12/04/2018   Lab Results  Component Value Date   WBC 6.9 02/03/2020   HGB 13.3 02/03/2020   HCT 41.1 02/03/2020   MCV 92.2 02/03/2020   PLT 270 02/03/2020   Lab Results  Component Value Date   IRON 43 08/18/2010   TIBC 389 08/18/2010   FERRITIN 227 08/18/2010   Obesity Behavioral Intervention:   Approximately 15 minutes were spent on the discussion below.  ASK: We discussed the diagnosis of obesity with [name] today and Dionne agreed to give [place] permission to discuss obesity behavioral modification therapy today.  ASSESS: Daina has the diagnosis of  obesity and her BMI today is 37.5. Brynlee is in the action stage of change.   ADVISE: Katherinne was educated on the multiple health risks of obesity as well as the benefit of weight loss to improve her health. She was advised of the need for long term treatment and the importance of lifestyle modifications to improve her current health and to decrease her risk of future health problems.  AGREE: Multiple dietary modification options and treatment options were discussed and Desirea agreed to follow the recommendations documented in the above note.  ARRANGE: Ashtyn was educated on the importance of frequent visits to treat obesity as outlined  per CMS and USPSTF guidelines and agreed to schedule her next follow up appointment today.  Attestation Statements:   Reviewed by clinician on day of visit: allergies, medications, problem list, medical history, surgical history, family history, social history, and previous encounter notes.  I, Water quality scientist, CMA, am acting as Location manager for [employer], DO.  I have reviewed the above documentation for accuracy and completeness, and I agree with the above. -  Mellody Dance, DO

## 2020-03-15 ENCOUNTER — Ambulatory Visit (INDEPENDENT_AMBULATORY_CARE_PROVIDER_SITE_OTHER): Payer: Medicare HMO | Admitting: Family Medicine

## 2020-03-15 ENCOUNTER — Other Ambulatory Visit: Payer: Self-pay

## 2020-03-15 ENCOUNTER — Encounter (INDEPENDENT_AMBULATORY_CARE_PROVIDER_SITE_OTHER): Payer: Self-pay | Admitting: Family Medicine

## 2020-03-15 VITALS — BP 111/69 | HR 79 | Temp 98.0°F | Ht 65.0 in | Wt 220.0 lb

## 2020-03-15 DIAGNOSIS — Z6836 Body mass index (BMI) 36.0-36.9, adult: Secondary | ICD-10-CM

## 2020-03-15 DIAGNOSIS — E559 Vitamin D deficiency, unspecified: Secondary | ICD-10-CM | POA: Diagnosis not present

## 2020-03-15 DIAGNOSIS — F3289 Other specified depressive episodes: Secondary | ICD-10-CM

## 2020-03-15 DIAGNOSIS — E1169 Type 2 diabetes mellitus with other specified complication: Secondary | ICD-10-CM | POA: Diagnosis not present

## 2020-03-15 DIAGNOSIS — Z794 Long term (current) use of insulin: Secondary | ICD-10-CM | POA: Diagnosis not present

## 2020-03-15 MED ORDER — VITAMIN D (ERGOCALCIFEROL) 1.25 MG (50000 UNIT) PO CAPS: 50000.0000 [IU] | ORAL_CAPSULE | ORAL | 0 refills | Status: DC

## 2020-03-16 ENCOUNTER — Telehealth (INDEPENDENT_AMBULATORY_CARE_PROVIDER_SITE_OTHER): Payer: Medicare HMO | Admitting: Psychology

## 2020-03-16 DIAGNOSIS — F419 Anxiety disorder, unspecified: Secondary | ICD-10-CM | POA: Diagnosis not present

## 2020-03-16 DIAGNOSIS — F3289 Other specified depressive episodes: Secondary | ICD-10-CM

## 2020-03-16 NOTE — Progress Notes (Signed)
Chief Complaint:   OBESITY Shamika is here to discuss her progress with her obesity treatment plan along with follow-up of her obesity related diagnoses. [place] is on the Category 1 Plan and states she is following her eating plan approximately 50% of the time. Jesse states she is walking for 2-3 hours 3 times per week.  Today's visit was #: 3 Starting weight: 228 lbs Starting date: 02/15/2020 Today's weight: 222 lbs Today's date: 03/15/2020 Total lbs lost to date: 6 lbs Total lbs lost since last in-office visit: 3 lbs  Interim History:  Zaide says that things are going well.  She has been looking at calories on foods much more.   She has been craving snacks and "pizza for some reason".    Overall, the plan is "going very well" for her and she finds it easy to follow.  Denies complaints.    Does not think she needs a medication to help with cravings.    We discussed how to meal prep and make healthy alternatives to food she craves - ie. Pizza with [employer] lavish bread etc.     Assessment/Plan:   1. Type 2 diabetes mellitus with other specified complication, with long-term current use of insulin (HCC) Medications reviewed. Diabetic ROS: no polyuria or polydipsia, no chest pain or dyspnea.  no numbness, tingling or pain in extremities.    Blood sugars have been running 70s-90s to low 100s.  Lowest was 68, highest 120s.  Denies symptoms or concerns.    She is on Trulicity, Amaryl, Lantus, and metformin per Endo.  No complaints.   Plan:   BS's appear stable currently.  A1c not at goal but will follow q 3 mo  Symptoms and prevention of hypoglycemia d/c pt.   I told her the goal would be to decrease her insulin dose as the first move, if okay with Endo, to help with hunger/cravings/weight loss.    Will closely follow and decrease as needed.    Follow-up with Endo as scheduled for routine diabetes care.  Lab Results  Component Value Date   HGBA1C 9.0 (H) 01/11/2020    HGBA1C 9.8 (A) 09/23/2019   HGBA1C 9.3 (A) 03/22/2019   Lab Results  Component Value Date   MICROALBUR 80 11/13/2016   LDLCALC 87 02/15/2020   CREATININE 1.10 (H) 01/11/2020     2. Vitamin D deficiency Current vitamin D is 38.2, tested on 02/15/2020. Not at goal. Optimal goal > 50 ng/dL. There is also evidence to support a goal of >70 ng/dL in patients with cancer and heart disease. Plan: Continue Vitamin D @50 ,000 IU every week with follow-up for routine testing of Vitamin D at least 2-3 times per year to avoid over-replacement.  -Refill Vitamin D, Ergocalciferol, (DRISDOL) 1.25 MG (50000 UNIT) CAPS capsule; Take 1 capsule (50,000 Units total) by mouth every 7 (seven) days.  Dispense: 4 capsule; Refill: 0    3. Other depression, with emotional eating Eeva is working with Dr. Mallie Mussel.  She says it is helping and she finds it useful.  Symptoms are stable.  Plan:  - We have discussed mindfulness and alternatives on how to make some unhealthy items "healthy" and ways to change recipes.  Inc activity to help with stress    4. Class 2 severe obesity with serious comorbidity and body mass index (BMI) of 36.0 to 36.9 in adult, unspecified obesity type (HCC)  Maniyah is currently in the action stage of change. As such, her goal is to  continue with weight loss efforts. She has agreed to the Category 1 Plan.   Exercise goals: As is.  Behavioral modification strategies: meal planning and cooking strategies, keeping healthy foods in the home and planning for success.  Briani has agreed to follow-up with our clinic in 2 weeks. She was informed of the importance of frequent follow-up visits to maximize her success with intensive lifestyle modifications for her multiple health conditions.      Objective:   Blood pressure 111/69, pulse 79, temperature 98 F (36.7 C), height 5\' 5"  (1.651 m), weight 220 lb (99.8 kg), SpO2 100 %. Body mass index is 36.61 kg/m.  General: Cooperative, alert,  well developed, in no acute distress. HEENT: Conjunctivae and lids unremarkable. Cardiovascular: Regular rhythm.  Lungs: Normal work of breathing. Neurologic: No focal deficits.   Lab Results  Component Value Date   CREATININE 1.10 (H) 01/11/2020   BUN 17 01/11/2020   NA 137 01/11/2020   K 3.9 01/11/2020   CL 101 01/11/2020   CO2 24 01/11/2020   Lab Results  Component Value Date   ALT 36 01/11/2020   AST 35 01/11/2020   ALKPHOS 53 01/11/2020   BILITOT 1.0 01/11/2020   Lab Results  Component Value Date   HGBA1C 9.0 (H) 01/11/2020   HGBA1C 9.8 (A) 09/23/2019   HGBA1C 9.3 (A) 03/22/2019   HGBA1C 10.8 (H) 12/04/2018   HGBA1C 9.6 (H) 08/28/2018   Lab Results  Component Value Date   TSH 0.938 02/15/2020   Lab Results  Component Value Date   CHOL 164 02/15/2020   HDL 39 (L) 02/15/2020   LDLCALC 87 02/15/2020   TRIG 227 (H) 02/15/2020   CHOLHDL 4.0 12/04/2018   Lab Results  Component Value Date   WBC 6.9 02/03/2020   HGB 13.3 02/03/2020   HCT 41.1 02/03/2020   MCV 92.2 02/03/2020   PLT 270 02/03/2020   Lab Results  Component Value Date   IRON 43 08/18/2010   TIBC 389 08/18/2010   FERRITIN 227 08/18/2010   Obesity Behavioral Intervention:   Approximately 15 minutes were spent on the discussion below.  ASK: We discussed the diagnosis of obesity with [name] today and Alithea agreed to give [place] permission to discuss obesity behavioral modification therapy today.  ASSESS: Shirelle has the diagnosis of obesity and her BMI today is 36.9. Keyona is in the action stage of change.   ADVISE: Shekita was educated on the multiple health risks of obesity as well as the benefit of weight loss to improve her health. She was advised of the need for long term treatment and the importance of lifestyle modifications to improve her current health and to decrease her risk of future health problems.  AGREE: Multiple dietary modification options and treatment options were discussed  and Jalyne agreed to follow the recommendations documented in the above note.  ARRANGE: Kielee was educated on the importance of frequent visits to treat obesity as outlined per CMS and USPSTF guidelines and agreed to schedule her next follow up appointment today.  Attestation Statements:   Reviewed by clinician on day of visit: allergies, medications, problem list, medical history, surgical history, family history, social history, and previous encounter notes.  I, Water quality scientist, CMA, am acting as Location manager for [employer], DO.  I have reviewed the above documentation for accuracy and completeness, and I agree with the above. Mellody Dance, DO

## 2020-03-16 NOTE — Progress Notes (Signed)
  Office: [phone]  /  Fax: [phone]    Date: March 30, 2020   Appointment Start Time: 9:22am Duration: 30 minutes Provider: Glennie Isle, Psy.D. Type of Session: Individual Therapy  Location of Patient: Home Location of Provider: Provider's Home Type of Contact: Telepsychological Visit via MyChart Video Visit  Session Content: Piya is a 54 y.o. female presenting for a follow-up appointment to address the previously established treatment goal of increasing coping skills. Today's appointment was a telepsychological visit due to COVID-19. Amalya provided verbal consent for today's telepsychological appointment and she is aware she is responsible for securing confidentiality on her end of the session. Prior to proceeding with today's appointment, Ivanna's physical location at the time of this appointment was obtained as well a phone number she could be reached at in the event of technical difficulties. Olanna and this provider participated in today's telepsychological service.   This provider conducted a brief check-in. Caterin shared she has "a new grand baby." Bracha also shared her blood sugars have been fluctuating, which she discussed with Dr. Raliegh Scarlet. She noted the aforementioned made her realize she does not prioritize self-care. As such, psychoeducation regarding the importance of self-care utilizing the oxygen mask metaphor was provided.  Additionally, psychoeducation regarding pleasurable activities, including its impact on emotional eating and overall well-being was provided to assist with increasing self-care and coping. Rex was provided with a handout with various options of pleasurable activities, and was encouraged to engage in one activity a day and additional activities as needed when triggered to emotionally eat. [name] agreed. Nia provided verbal consent during today's appointment for this provider to send a handout with pleasurable activities via e-mail. For today,  she noted a plan to video call her daughter in law to see her granddaughter. Reyes was receptive to today's appointment as evidenced by openness to sharing, responsiveness to feedback, and willingness to engage in pleasurable activities to assist with coping.  Mental Status Examination:  Appearance: well groomed and appropriate hygiene  Behavior: appropriate to circumstances Mood: euthymic Affect: mood congruent Speech: normal in rate, volume, and tone Eye Contact: appropriate Psychomotor Activity: appropriate Gait: unable to assess Thought Process: linear, logical, and goal directed  Thought Content/Perception: no hallucinations, delusions, bizarre thinking or behavior reported or observed and no evidence of suicidal and homicidal ideation, plan, and intent Orientation: time, person, place, and purpose of appointment Memory/Concentration: memory, attention, language, and fund of knowledge intact  Insight/Judgment: fair  Interventions:  Conducted a brief chart review Provided empathic reflections and validation Employed supportive psychotherapy interventions to facilitate reduced distress and to improve coping skills with identified stressors Psychoeducation provided regarding pleasurable activities Psychoeducation provided regarding self-care  DSM-5 Diagnosis(es): 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 Eydie has demonstrated progress in her goal as evidenced by increased awareness of hunger patterns and increased awareness of triggers for emotional eating. Ajayla also continues to demonstrate willingness to engage in learned skill(s).  Plan: The next appointment will be scheduled in two weeks, which will be via MyChart Video Visit. The next session will  focus on working towards the established treatment goal.

## 2020-03-16 NOTE — Progress Notes (Signed)
Office: [phone]  /  Fax: [phone]    Date: March 16, 2020    Appointment Start Time: 9:58am Duration: 22 minutes Provider: Glennie Isle, Psy.D. Type of Session: Individual Therapy  Location of Patient: Home Location of Provider: Provider's Home Type of Contact: Telepsychological Visit via MyChart Video Visit  Session Content: Sarah [name] is a 54 y.o. female presenting for a follow-up appointment to address the previously established treatment goal of increasing coping skills. Today's appointment was a telepsychological visit due to COVID-19. Sarah [name] provided verbal consent for today's telepsychological appointment and she is aware she is responsible for securing confidentiality on her end of the session. Prior to proceeding with today's appointment, Sarah [name]'s physical location at the time of this appointment was obtained as well a phone number she could be reached at in the event of technical difficulties. Sarah [name] and this provider participated in today's telepsychological service.   This provider conducted a brief check-in. Sarah [name] shared she was experiencing cravings, adding she discussed alternate options with Dr. Raliegh Scarlet yesterday. She also described making better choices. Triggers for emotional eating were reviewed. Sarah [name] shared about recent weight loss, but described feeling sad about the number of pounds lost. As such, psychoeducation regarding thought defusion, including its impact on emotional eating and overall well-being was provided to assist with coping. Sarah [name] was led through thought defusion exercises, and a handout with various exercises was provided. Sarah [name] was encouraged to engage in the thought defusion exercises between now and the next appointment with this provider. Sarah [name] agreed. For the thought defusion exercises during today's appointment, Sarah [name] utilized the following thought: "I am a failure." Her experience was processed. Jaia provided verbal consent during today's  appointment for this provider to send a handout with thought defusion exercises via e-mail. Notably, this provider discussed her upcoming maternity leave toward the end of November. Sarah [name] acknowledged understanding given the uncertain nature of the circumstances, this provider may be out of the office sooner. This provider and Sarah [name] discussed referral options and verbal consent was provided for this provider to send a list of referral options via e-mail. All questions/concerns were addressed. Sarah [name] denied any concerns. Overall, Sarah [name] was receptive to today's appointment as evidenced by openness to sharing, responsiveness to feedback, and willingness to engage in thought defusion exercises to assist with coping.  Mental Status Examination:  Appearance: well groomed and appropriate hygiene  Behavior: appropriate to circumstances Mood: euthymic Affect: mood congruent Speech: normal in rate, volume, and tone Eye Contact: appropriate Psychomotor Activity: appropriate Gait: unable to assess Thought Process: linear, logical, and goal directed  Thought Content/Perception: no hallucinations, delusions, bizarre thinking or behavior reported or observed and no evidence of suicidal and homicidal ideation, plan, and intent Orientation: time, person, place, and purpose of appointment Memory/Concentration: memory, attention, language, and fund of knowledge intact  Insight/Judgment: fair  Interventions:  Conducted a brief chart review Provided empathic reflections and validation Reviewed content from the previous session Employed supportive psychotherapy interventions to facilitate reduced distress and to improve coping skills with identified stressors Psychoeducation provided regarding thought defusion Engaged patient in thought defusion exercise(s) Employed acceptance and commitment interventions to emphasize mindfulness and acceptance without struggle  DSM-5 Diagnosis(es): 311 (F32.8) Other  Specified Depressive Disorder, Emotional Eating Behaviors and 300.00 (F41.9) Unspecified Anxiety Disorder  Treatment Goal & Progress: During the initial appointment with this provider, the following treatment goal was established: increase coping skills. Sarah [name] has demonstrated progress in her goal as evidenced by increased awareness of hunger patterns and increased awareness of  triggers for emotional eating. Sarah [name] also demonstrates willingness to engage in thought defusion exercises.  Plan: The next appointment will be scheduled in two weeks, which will be via MyChart Video Visit. The next session will focus on working towards the established treatment goal.

## 2020-03-22 ENCOUNTER — Other Ambulatory Visit: Payer: Self-pay | Admitting: Internal Medicine

## 2020-03-22 DIAGNOSIS — IMO0002 Reserved for concepts with insufficient information to code with codable children: Secondary | ICD-10-CM

## 2020-03-22 DIAGNOSIS — E1165 Type 2 diabetes mellitus with hyperglycemia: Secondary | ICD-10-CM

## 2020-03-22 DIAGNOSIS — I10 Essential (primary) hypertension: Secondary | ICD-10-CM

## 2020-03-23 ENCOUNTER — Ambulatory Visit: Payer: Medicare HMO | Admitting: Podiatry

## 2020-03-23 ENCOUNTER — Encounter: Payer: Self-pay | Admitting: Podiatry

## 2020-03-23 ENCOUNTER — Other Ambulatory Visit: Payer: Self-pay

## 2020-03-23 DIAGNOSIS — M722 Plantar fascial fibromatosis: Secondary | ICD-10-CM | POA: Diagnosis not present

## 2020-03-23 MED ORDER — METHYLPREDNISOLONE 4 MG PO TBPK
ORAL_TABLET | ORAL | 0 refills | Status: DC
Start: 2020-03-23 — End: 2020-04-28

## 2020-03-23 NOTE — Progress Notes (Signed)
She presents today for follow-up of her plantar fasciitis she states that it came back about 2 weeks ago as far as her left foot gives states that she feels that it may have come back due to wearing the wrong shoes.  Objective: Vital signs are stable she alert oriented x3 strong palpable pulses are noted.  She has pain on palpation medial calcaneal tubercle of her left heel at the plantar fashion calcaneal insertion site.  Assessment: Pain in limb plantar fasciitis of the left foot.  Plan: Start her on methylprednisolone I injected her left heel today once again 20 mg Kenalog 5 mg Marcaine.

## 2020-03-28 ENCOUNTER — Other Ambulatory Visit: Payer: Self-pay | Admitting: Internal Medicine

## 2020-03-28 DIAGNOSIS — I10 Essential (primary) hypertension: Secondary | ICD-10-CM

## 2020-03-28 NOTE — Telephone Encounter (Signed)
Requested medication (s) are due for refill today: yes  Requested medication (s) are on the active medication list: yes  Last refill:  11/08/19  Future visit scheduled: yes  Notes to clinic:  historical provider     Requested Prescriptions  Pending Prescriptions Disp Refills   metFORMIN (GLUCOPHAGE) 1000 MG tablet [Pharmacy Med Name: METFORMIN HYDROCHLORIDE 1000 MG Tablet] 180 tablet     Sig: TAKE 1 [place]      Endocrinology:  Diabetes - Biguanides Failed - 03/28/2020  4:51 PM      Failed - Cr in normal range and within 360 days    Creatinine  Date Value Ref Range Status  12/29/2019 1.26 (H) 0.44 - 1.00 mg/dL Final  11/20/2016 1.1 0.6 - 1.1 mg/dL Final   Creatinine, Ser  Date Value Ref Range Status  01/11/2020 1.10 (H) 0.44 - 1.00 mg/dL Final   Creatinine, POC  Date Value Ref Range Status  11/13/2016 100 mg/dL Final   Creatinine, Urine  Date Value Ref Range Status  07/27/2015 174 20 - 320 mg/dL Final          Failed - HBA1C is between 0 and 7.9 and within 180 days    HbA1c, POC (controlled diabetic range)  Date Value Ref Range Status  09/23/2019 9.8 (A) 0.0 - 7.0 % Final   Hgb A1c MFr Bld  Date Value Ref Range Status  01/11/2020 9.0 (H) 4.8 - 5.6 % Final    Comment:    (NOTE) Pre diabetes:          5.7%-6.4%  Diabetes:              >6.4%  Glycemic control for   <7.0% adults with diabetes           Passed - eGFR in normal range and within 360 days    GFR, Est African American  Date Value Ref Range Status  07/08/2014 73 mL/min Final   GFR, Est AFR Am  Date Value Ref Range Status  12/29/2019 56 (L) >60 mL/min Final   GFR calc Af Amer  Date Value Ref Range Status  01/11/2020 >60 >60 mL/min Final   GFR, Est Non African American  Date Value Ref Range Status  07/08/2014 63 mL/min Final    Comment:      The estimated GFR is a calculation valid for adults (>=36 years old) that uses the CKD-EPI algorithm to adjust for age and  sex. It is   not to be used for children, pregnant women, hospitalized patients,    patients on dialysis, or with rapidly changing kidney function. According to the NKDEP, eGFR >89 is normal, 60-89 shows mild impairment, 30-59 shows moderate impairment, 15-29 shows severe impairment and <15 is ESRD.      GFR, Estimated  Date Value Ref Range Status  12/29/2019 48 (L) >60 mL/min Final   GFR calc non Af Amer  Date Value Ref Range Status  01/11/2020 57 (L) >60 mL/min Final   EGFR  Date Value Ref Range Status  11/20/2016 69 (L) >90 ml/min/1.73 m2 Final    Comment:    eGFR is calculated using the CKD-EPI Creatinine Equation (2009)          Passed - Valid encounter within last 6 months    Recent Outpatient Visits           2 months ago Need for shingles vaccine   [place], RPH-CPP  2 months ago Encounter for [employer] annual wellness exam   [place] Ladell Pier, MD   2 months ago Encounter for medication review   [place], RPH-CPP   2 months ago Uncontrolled type 2 diabetes mellitus with peripheral neuropathy [facility])   [place] Ladell Pier, MD   4 months ago Uncontrolled type 2 diabetes mellitus with peripheral neuropathy [facility])   [place], RPH-CPP       Future Appointments             In 2 days Nash Dimmer, PsyD [facility]   In 1 month Ladell Pier, MD [place]             Signed Prescriptions Disp Refills   losartan (COZAAR) 50 MG tablet 90 tablet 0    Sig: TAKE 1 TABLET EVERY DAY      Cardiovascular:  Angiotensin Receptor Blockers Failed - 03/28/2020  4:51 PM      Failed - Cr in normal range and within 180 days    Creatinine  Date Value Ref Range Status   12/29/2019 1.26 (H) 0.44 - 1.00 mg/dL Final  11/20/2016 1.1 0.6 - 1.1 mg/dL Final   Creatinine, Ser  Date Value Ref Range Status  01/11/2020 1.10 (H) 0.44 - 1.00 mg/dL Final   Creatinine, POC  Date Value Ref Range Status  11/13/2016 100 mg/dL Final   Creatinine, Urine  Date Value Ref Range Status  07/27/2015 174 20 - 320 mg/dL Final          Passed - K in normal range and within 180 days    Potassium  Date Value Ref Range Status  01/11/2020 3.9 3.5 - 5.1 mmol/L Final  04/22/2016 3.9 3.5 - 5.1 mEq/L Final          Passed - Patient is not pregnant      Passed - Last BP in normal range    BP Readings from Last 1 Encounters:  03/15/20 111/69          Passed - Valid encounter within last 6 months    Recent Outpatient Visits           2 months ago Need for shingles vaccine   [place], Jarome Matin, RPH-CPP   2 months ago Encounter for [employer] annual wellness exam   [place], Deborah B, MD   2 months ago Encounter for medication review   [place], Jarome Matin, RPH-CPP   2 months ago Uncontrolled type 2 diabetes mellitus with peripheral neuropathy [facility])   [place], Deborah B, MD   4 months ago Uncontrolled type 2 diabetes mellitus with peripheral neuropathy [facility])   [place], Jarome Matin, RPH-CPP       Future Appointments             In 2 days Nash Dimmer, PsyD [facility]   In 1 month Ladell Pier, MD [place]              glucose blood (ACCU-CHEK AVIVA PLUS) test strip 300 strip 0    Sig: USE AS DIRECTED  Endocrinology: Diabetes - Testing Supplies Passed - 03/28/2020  4:51 PM      Passed - Valid encounter within last 12 months    Recent Outpatient Visits           2 months ago  Need for shingles vaccine   [place], RPH-CPP   2 months ago Encounter for [employer] annual wellness exam   [place] Ladell Pier, MD   2 months ago Encounter for medication review   [place], RPH-CPP   2 months ago Uncontrolled type 2 diabetes mellitus with peripheral neuropathy [facility])   [place], MD   4 months ago Uncontrolled type 2 diabetes mellitus with peripheral neuropathy [facility])   [place], RPH-CPP       Future Appointments             In 2 days Nash Dimmer, PsyD [place]   In 1 month Wynetta Emery Dalbert Batman, MD [place]

## 2020-03-29 ENCOUNTER — Other Ambulatory Visit: Payer: Self-pay

## 2020-03-29 ENCOUNTER — Encounter (INDEPENDENT_AMBULATORY_CARE_PROVIDER_SITE_OTHER): Payer: Self-pay | Admitting: Family Medicine

## 2020-03-29 ENCOUNTER — Other Ambulatory Visit: Payer: Self-pay | Admitting: Internal Medicine

## 2020-03-29 ENCOUNTER — Ambulatory Visit (INDEPENDENT_AMBULATORY_CARE_PROVIDER_SITE_OTHER): Payer: Medicare HMO | Admitting: Family Medicine

## 2020-03-29 VITALS — BP 130/78 | HR 73 | Temp 97.5°F | Ht 65.0 in | Wt 222.0 lb

## 2020-03-29 DIAGNOSIS — E559 Vitamin D deficiency, unspecified: Secondary | ICD-10-CM

## 2020-03-29 DIAGNOSIS — Z794 Long term (current) use of insulin: Secondary | ICD-10-CM | POA: Diagnosis not present

## 2020-03-29 DIAGNOSIS — E1169 Type 2 diabetes mellitus with other specified complication: Secondary | ICD-10-CM

## 2020-03-29 DIAGNOSIS — E66812 Obesity, class 2: Secondary | ICD-10-CM

## 2020-03-29 DIAGNOSIS — Z6837 Body mass index (BMI) 37.0-37.9, adult: Secondary | ICD-10-CM

## 2020-03-29 MED ORDER — VITAMIN D (ERGOCALCIFEROL) 1.25 MG (50000 UNIT) PO CAPS: 50000.0000 [IU] | ORAL_CAPSULE | ORAL | 0 refills | Status: DC

## 2020-03-29 NOTE — Telephone Encounter (Signed)
error 

## 2020-03-30 ENCOUNTER — Telehealth (INDEPENDENT_AMBULATORY_CARE_PROVIDER_SITE_OTHER): Payer: Medicare HMO | Admitting: Psychology

## 2020-03-30 DIAGNOSIS — F3289 Other specified depressive episodes: Secondary | ICD-10-CM | POA: Diagnosis not present

## 2020-03-30 DIAGNOSIS — F419 Anxiety disorder, unspecified: Secondary | ICD-10-CM

## 2020-03-30 NOTE — Progress Notes (Signed)
Office: [phone]  /  Fax: [phone]    Date: April 13, 2020   Appointment Start Time: 8:51am Duration: 23 minutes Provider: Glennie Isle, Psy.D. Type of Session: Individual Therapy  Location of Patient: Home Location of Provider: Provider's Home Type of Contact: Telepsychological Visit via MyChart Video Visit  Session Content: Sarah [name] is a 54 y.o. female presenting for a follow-up appointment to address the previously established treatment goal of increasing coping skills. Today's appointment was a telepsychological visit due to COVID-19. Sarah [name] provided verbal consent for today's telepsychological appointment and she is aware she is responsible for securing confidentiality on her end of the session. Prior to proceeding with today's appointment, Sarah [name]'s physical location at the time of this appointment was obtained as well a phone number she could be reached at in the event of technical difficulties. Sarah [name] and this provider participated in today's telepsychological service.   This provider conducted a brief check-in. Sarah [name] reported, "I've been good." She stated her "sugars have been dropping really low," adding she spoke with her PCP and adjustments were made to her insulin. Additionally, she stated she is following her meal plan and described a reduction in emotional eating. Pleasurable activities/self-care were reviewed. She described engaging in pleasurable activities and noted she is prioritizing herself. Positive reinforcement was provided and she was encouraged to continue engaging in pleasurable activities; Sarah [name] agreed.   Due to the upcoming holidays, psychoeducation regarding making better choices and engaging in portion control during the holidays/celebrations was provided. More specifically, this provider discussed the following strategies: coming to holiday meals hungry, but not starving; avoid filling up on appetizers; managing portion sizes; not completely depriving  yourself; making the plate colorful (e.g., vegetables); pacing yourself (e.g., waiting 10 minutes before going back for seconds); taking advantage of the nutritious foods; staying hydrated; and avoid bringing home leftovers. She was receptive to the shared strategies as evidenced by her stating, "They're do able." Overall, Sarah [name] was receptive to today's appointment as evidenced by openness to sharing, responsiveness to feedback, and willingness to implement discussed strategies .  Mental Status Examination:  Appearance: well groomed and appropriate hygiene  Behavior: appropriate to circumstances Mood: euthymic Affect: mood congruent Speech: normal in rate, volume, and tone Eye Contact: appropriate Psychomotor Activity: appropriate Gait: unable to assess Thought Process: linear, logical, and goal directed  Thought Content/Perception: no hallucinations, delusions, bizarre thinking or behavior reported or observed and no evidence of suicidal and homicidal ideation, plan, and intent Orientation: time, person, place, and purpose of appointment Memory/Concentration: memory, attention, language, and fund of knowledge intact  Insight/Judgment: good  Interventions:  Conducted a brief chart review Provided empathic reflections and validation Reviewed content from the previous session Provided positive reinforcement Employed supportive psychotherapy interventions to facilitate reduced distress and to improve coping skills with identified stressors Discussed strategies for holidays  DSM-5 Diagnosis(es): 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 Sarah [name] demonstrated progress in her goal as evidenced by increased awareness of hunger patterns, increased awareness of triggers  for emotional eating and reduction in emotional eating. Sarah [name] also continues to demonstrate willingness to engage in learned skill(s).  Plan: Sarah [name] declined future appointments with this provider, noting she feels she has the tools to be successful with her meal plan. She acknowledged understanding that she may request a follow-up appointment with this provider (following this provider's  maternity leave as previously discussed) in the future as long as she is still established with the clinic. She was also receptive to reaching out to shared referrals if needed. No further follow-up planned by this provider.

## 2020-04-03 ENCOUNTER — Other Ambulatory Visit: Payer: Self-pay | Admitting: Internal Medicine

## 2020-04-03 ENCOUNTER — Encounter: Payer: Self-pay | Admitting: Internal Medicine

## 2020-04-03 DIAGNOSIS — E559 Vitamin D deficiency, unspecified: Secondary | ICD-10-CM

## 2020-04-03 DIAGNOSIS — I10 Essential (primary) hypertension: Secondary | ICD-10-CM

## 2020-04-03 DIAGNOSIS — IMO0002 Reserved for concepts with insufficient information to code with codable children: Secondary | ICD-10-CM

## 2020-04-03 DIAGNOSIS — E785 Hyperlipidemia, unspecified: Secondary | ICD-10-CM

## 2020-04-03 DIAGNOSIS — E1165 Type 2 diabetes mellitus with hyperglycemia: Secondary | ICD-10-CM

## 2020-04-03 MED ORDER — LOSARTAN POTASSIUM 50 MG PO TABS: 50.0000 mg | ORAL_TABLET | Freq: Every day | ORAL | 1 refills | Status: DC

## 2020-04-03 MED ORDER — AMLODIPINE BESYLATE 10 MG PO TABS: 10.0000 mg | ORAL_TABLET | Freq: Every day | ORAL | 3 refills | Status: DC

## 2020-04-03 MED ORDER — GLIMEPIRIDE 4 MG PO TABS: 4.0000 mg | ORAL_TABLET | Freq: Every day | ORAL | 3 refills | Status: DC

## 2020-04-03 NOTE — Telephone Encounter (Signed)
Medication Refill - Medication: amLODipine (NORVASC) 10 MG tablet glimepiride (AMARYL) 4 MG tablet     Preferred Pharmacy (with phone number or street name):  [facility], [place] Phone:  [phone]  Fax:  [phone]       Agent: Please be advised that RX refills may take up to 3 business days. We ask that you follow-up with your pharmacy.

## 2020-04-03 NOTE — Telephone Encounter (Signed)
Requested Prescriptions  Pending Prescriptions Disp Refills  . Vitamin D, Ergocalciferol, (DRISDOL) 1.25 MG (50000 UNIT) CAPS capsule [Pharmacy Med Name: VITAMIN D 1.25 MG (50000 UT) Capsule] 12 capsule     Sig: TAKE 1 CAPSULE EVERY 7 DAYS     Endocrinology:  Vitamins - Vitamin D Supplementation Failed - 04/03/2020 12:15 PM      Failed - 50,000 IU strengths are not delegated      Failed - Ca in normal range and within 360 days    Calcium  Date Value Ref Range Status  01/11/2020 10.7 (H) 8.9 - 10.3 mg/dL Final  04/22/2016 9.8 8.4 - 10.4 mg/dL Final         Failed - Phosphate in normal range and within 360 days    No results found for: PHOS       Passed - Vitamin D in normal range and within 360 days    Vitamin D2 1, 25 (OH)2  Date Value Ref Range Status  09/29/2019 48 pg/mL Final    Comment:    (NOTE) This test was developed and its performance characteristics determined by [facility]. It has not been cleared or approved by the Food and Drug Administration.    Vitamin D3 1, 25 (OH)2  Date Value Ref Range Status  09/29/2019 <10 pg/mL Final    Comment:    (NOTE) This test was developed and its performance characteristics determined by [facility]. It has not been cleared or approved by the Food and Drug Administration. Performed At: [place], [place] [id number] Jake Bathe F MD [id number]    Vitamin D 1, 25 (OH)2 Total  Date Value Ref Range Status  09/29/2019 49 pg/mL Final    Comment:    (NOTE) Reference Range: Adults: 21 - 65    Vit D, 25-Hydroxy  Date Value Ref Range Status  02/15/2020 38.2 30.0 - 100.0 ng/mL Final    Comment:    Vitamin D deficiency has been defined by the Institute of Medicine and an Endocrine Society practice guideline as a level of serum 25-OH vitamin D less than 20 ng/mL (1,2). The Endocrine Society went on to further define vitamin D insufficiency as a level between 21 and 29 ng/mL (2). 1. IOM  (Institute of Medicine). 2010. Dietary reference    intakes for calcium and D. [place]: The    [employer]. 2. Holick MF, Binkley [place], Bischoff-Ferrari HA, et al.    Evaluation, treatment, and prevention of vitamin D    deficiency: an Endocrine Society clinical practice    guideline. JCEM. 2011 Jul; 96(7):1911-30.          Passed - Valid encounter within last 12 months    Recent Outpatient Visits          2 months ago Need for shingles vaccine   [place], RPH-CPP   2 months ago Encounter for [employer] annual wellness exam   [place] Ladell Pier, MD   2 months ago Encounter for medication review   [place], RPH-CPP   2 months ago Uncontrolled type 2 diabetes mellitus with peripheral neuropathy [facility])   [place], MD   4 months ago Uncontrolled type 2 diabetes mellitus with peripheral neuropathy [facility])   [place], RPH-CPP  Future Appointments            In 1 week Nash Dimmer, PsyD [facility]   In 3 weeks Ladell Pier, MD [place]           . rosuvastatin (CRESTOR) 20 MG tablet [Pharmacy Med Name: ROSUVASTATIN CALCIUM 20 MG Tablet] 90 tablet 3    Sig: TAKE 1 TABLET EVERY DAY. STOP ATORVASTATIN     Cardiovascular:  Antilipid - Statins Failed - 04/03/2020 12:15 PM      Failed - LDL in normal range and within 360 days    LDL Chol Calc (NIH)  Date Value Ref Range Status  02/15/2020 87 0 - 99 mg/dL Final         Failed - HDL in normal range and within 360 days    HDL  Date Value Ref Range Status  02/15/2020 39 (L) >39 mg/dL Final         Failed - Triglycerides in normal range and within 360 days    Triglycerides  Date Value Ref Range  Status  02/15/2020 227 (H) 0 - 149 mg/dL Final         Passed - Total Cholesterol in normal range and within 360 days    Cholesterol, Total  Date Value Ref Range Status  02/15/2020 164 100 - 199 mg/dL Final         Passed - Patient is not pregnant      Passed - Valid encounter within last 12 months    Recent Outpatient Visits          2 months ago Need for shingles vaccine   [place], Jarome Matin, RPH-CPP   2 months ago Encounter for [employer] annual wellness exam   [place], Deborah B, MD   2 months ago Encounter for medication review   [place], RPH-CPP   2 months ago Uncontrolled type 2 diabetes mellitus with peripheral neuropathy [facility])   [place], MD   4 months ago Uncontrolled type 2 diabetes mellitus with peripheral neuropathy [facility])   [place], Jarome Matin, RPH-CPP      Future Appointments            In 1 week Nash Dimmer, PsyD [place]   In 3 weeks Ladell Pier, MD [place]

## 2020-04-03 NOTE — Telephone Encounter (Signed)
Requested Prescriptions  Pending Prescriptions Disp Refills   amLODipine (NORVASC) 10 MG tablet 90 tablet 3    Sig: Take 1 tablet (10 mg total) by mouth daily.     Cardiovascular:  Calcium Channel Blockers Passed - 04/03/2020 11:04 AM      Passed - Last BP in normal range    BP Readings from Last 1 Encounters:  03/29/20 130/78         Passed - Valid encounter within last 6 months    Recent Outpatient Visits          2 months ago Need for shingles vaccine   [place], RPH-CPP   2 months ago Encounter for [employer] annual wellness exam   [place] Ladell Pier, MD   2 months ago Encounter for medication review   [place], RPH-CPP   2 months ago Uncontrolled type 2 diabetes mellitus with peripheral neuropathy [facility])   [place] Ladell Pier, MD   4 months ago Uncontrolled type 2 diabetes mellitus with peripheral neuropathy [facility])   [place], RPH-CPP      Future Appointments            In 1 week Nash Dimmer, PsyD [facility]   In 3 weeks Ladell Pier, MD [place]            glimepiride (AMARYL) 4 MG tablet 90 tablet 3    Sig: Take 1 tablet (4 mg total) by mouth daily with breakfast.     Endocrinology:  Diabetes - Sulfonylureas Failed - 04/03/2020 11:04 AM      Failed - HBA1C is between 0 and 7.9 and within 180 days    HbA1c, POC (controlled diabetic range)  Date Value Ref Range Status  09/23/2019 9.8 (A) 0.0 - 7.0 % Final   Hgb A1c MFr Bld  Date Value Ref Range Status  01/11/2020 9.0 (H) 4.8 - 5.6 % Final    Comment:    (NOTE) Pre diabetes:          5.7%-6.4%  Diabetes:              >6.4%  Glycemic control for   <7.0% adults with diabetes          Passed - Valid  encounter within last 6 months    Recent Outpatient Visits          2 months ago Need for shingles vaccine   [place], Jarome Matin, RPH-CPP   2 months ago Encounter for [employer] annual wellness exam   [place], Deborah B, MD   2 months ago Encounter for medication review   [place], Jarome Matin, RPH-CPP   2 months ago Uncontrolled type 2 diabetes mellitus with peripheral neuropathy [facility])   [place], Deborah B, MD   4 months ago Uncontrolled type 2 diabetes mellitus with peripheral neuropathy [facility])   [place], Jarome Matin, RPH-CPP      Future Appointments            In 1 week Nash Dimmer, PsyD [facility]   In 3  weeks Ladell Pier, MD [place]

## 2020-04-03 NOTE — Addendum Note (Signed)
Addended by: Karle Plumber B on: 04/03/2020 12:12 PM   Modules accepted: Orders

## 2020-04-04 NOTE — Progress Notes (Signed)
Chief Complaint:   OBESITY Sarah [name] is here to discuss Sarah [name] progress with Sarah [name] obesity treatment plan along with follow-up of Sarah [name] obesity related diagnoses. Sarah [name] is on the Category 1 Plan and states she is following Sarah [name] eating plan approximately 20% of the time. Sarah [name] states she is doing [employer] for 120 minutes 4-5 times per week.  Today's visit was #: 4 Starting weight: 228 lbs Starting date: 02/15/2020 Today's weight: 222 lbs Today's date: 03/29/2020 Total lbs lost to date: 6 lbs Total lbs lost since last in-office visit: 0 Total weight loss percentage to date: -2.63%  Interim History:  Sarah [name] had a new grandchild - a girl, has not been focusing on herself.    Denies cravings and says she was "just off Sarah [name] game over the last 2 weeks".  Wasn't really eating Sarah [name] meals, thus can't speak to hunger levels on plan.    She will skip breakfast and lunch at times.    Not meal prepping or planning.  She is not putting herself first.  She is working for [employer] - [employer] for 5 hours per day.  She preps groceries and gets orders together.   Assessment/Plan:    1. Type 2 diabetes mellitus with other specified complication, with long-term current use of insulin (HCC) A1c on 01/11/2020 was 9.0.  FBS running 200s.    Lab Results  Component Value Date   HGBA1C 9.0 (H) 01/11/2020   HGBA1C 9.8 (A) 09/23/2019   HGBA1C 9.3 (A) 03/22/2019   No results found for: INSULIN Lab Results  Component Value Date   MICROALBUR 80 11/13/2016   LDLCALC 87 02/15/2020   CREATININE 1.10 (H) 01/11/2020   Plan:  Reviewed with pt:  - Extensive discussion with Sarah [name] regarding poorly controlled blood sugar and detriment to Sarah [name] health.    -  encouraged self-monitoring.  Reviewed with pt blood sugar goals.  Keep log and bring into your OV's for your providers to review. - hypoglycemia prevention and treatment   - Recheck a1c around 04/12/2020.   - exercise and nutritional  requirements, including importance of minimizing simple carbohydrates in diet  - an eventual goal of 150 min/week or more PLUS 2 days /week of strengthening exercises.  - need for routine screenings with PCP yearly:  opthalmology and podiatry exams to prevent complications associated with DM  - self-monitoring blood pressure goal < 130/80.  Keep log and bring into your OV's for your providers to review.     - Also told patient if you ever feel poorly, please check your blood pressure and blood sugar, as one or the other could be the cause of your symptoms.  - We will continue to closely monitor especially as pt loses weight     2. Vitamin D deficiency Sarah [name] has a history of Vitamin D deficiency with resultant generalized fatigue as Sarah [name] primary symptom.  she is taking vitamin D 50,000 IU weekly for this deficiency and tolerating it well without side-effect.  Most recent Vitamin D lab reviewed-  level: 38.2 on 02/15/2020.  Plan:  - Discussed importance of vitamin D (as well as calcium) to their health and wellbeing.   - Informed patient this may be a lifelong thing, and she was encouraged to continue to take the medicine until told otherwise.    We will need to monitor levels regularly (every 3-4 mo on average) to keep levels within normal limits.   -Refill Vitamin D, Ergocalciferol, (DRISDOL) 1.25  MG (50000 UNIT) CAPS capsule; Take 1 capsule (50,000 Units total) by mouth every 7 (seven) days.  Dispense: 4 capsule; Refill: 0    3. Class 2 severe obesity with serious comorbidity and body mass index (BMI) of 37.0 to 37.9 in adult, unspecified obesity type (HCC)  Sarah [name] is currently in the action stage of change. As such, Sarah [name] goal is to continue with weight loss efforts. She has agreed to the Category 1 Plan.   Exercise goals: For substantial health benefits, adults should do at least 150 minutes (2 hours and 30 minutes) a week of moderate-intensity, or 75 minutes (1 hour and 15  minutes) a week of vigorous-intensity aerobic physical activity, or an equivalent combination of moderate- and vigorous-intensity aerobic activity. Aerobic activity should be performed in episodes of at least 10 minutes, and preferably, it should be spread throughout the week.  Behavioral modification strategies: decreasing simple carbohydrates, decreasing eating out and no skipping meals.  Sarah [name] has agreed to follow-up with our clinic in 2 weeks. She was informed of the importance of frequent follow-up visits to maximize Sarah [name] success with intensive lifestyle modifications for Sarah [name] multiple health conditions.   Objective:   Blood pressure 130/78, pulse 73, temperature (!) 97.5 F (36.4 C), height 5\' 5"  (1.651 m), weight 222 lb (100.7 kg), SpO2 98 %. Body mass index is 36.94 kg/m.  General: Cooperative, alert, well developed, in no acute distress. HEENT: Conjunctivae and lids unremarkable. Cardiovascular: Regular rhythm.  Lungs: Normal work of breathing. Neurologic: No focal deficits.   Lab Results  Component Value Date   CREATININE 1.10 (H) 01/11/2020   BUN 17 01/11/2020   NA 137 01/11/2020   K 3.9 01/11/2020   CL 101 01/11/2020   CO2 24 01/11/2020   Lab Results  Component Value Date   ALT 36 01/11/2020   AST 35 01/11/2020   ALKPHOS 53 01/11/2020   BILITOT 1.0 01/11/2020   Lab Results  Component Value Date   HGBA1C 9.0 (H) 01/11/2020   HGBA1C 9.8 (A) 09/23/2019   HGBA1C 9.3 (A) 03/22/2019   HGBA1C 10.8 (H) 12/04/2018   HGBA1C 9.6 (H) 08/28/2018   Lab Results  Component Value Date   TSH 0.938 02/15/2020   Lab Results  Component Value Date   CHOL 164 02/15/2020   HDL 39 (L) 02/15/2020   LDLCALC 87 02/15/2020   TRIG 227 (H) 02/15/2020   CHOLHDL 4.0 12/04/2018   Lab Results  Component Value Date   WBC 6.9 02/03/2020   HGB 13.3 02/03/2020   HCT 41.1 02/03/2020   MCV 92.2 02/03/2020   PLT 270 02/03/2020   Lab Results  Component Value Date   IRON 43 08/18/2010    TIBC 389 08/18/2010   FERRITIN 227 08/18/2010   Obesity Behavioral Intervention:   Approximately 15 minutes were spent on the discussion below.  ASK: We discussed the diagnosis of obesity with Sarah [name] today and Sarah [name] agreed to give [place] permission to discuss obesity behavioral modification therapy today.  ASSESS: Sarah [name] has the diagnosis of obesity and Sarah [name] BMI today is 37.1. Sarah [name] is in the action stage of change.   ADVISE: Oanh was educated on the multiple health risks of obesity as well as the benefit of weight loss to improve Sarah [name] health. She was advised of the need for long term treatment and the importance of lifestyle modifications to improve Sarah [name] current health and to decrease Sarah [name] risk of future health problems.  AGREE: Multiple dietary modification options and treatment options were discussed  and Sarah [name] agreed to follow the recommendations documented in the above note.  ARRANGE: Sarah [name] was educated on the importance of frequent visits to treat obesity as outlined per CMS and USPSTF guidelines and agreed to schedule Sarah [name] next follow up appointment today.  Attestation Statements:   Reviewed by clinician on day of visit: allergies, medications, problem list, medical history, surgical history, family history, social history, and previous encounter notes.  I, Water quality scientist, CMA, am acting as Location manager for [employer], DO.  I have reviewed the above documentation for accuracy and completeness, and I agree with the above. Marjory Sneddon, D.O.  The [place] was signed into law in 2016 which includes the topic of electronic health records.  This provides immediate access to information in MyChart.  This includes consultation notes, operative notes, office notes, lab results and pathology reports.  If you have any questions about what you read please let us know at your next visit so we can discuss your concerns and take corrective action if need be.  We are  right here with you.

## 2020-04-07 ENCOUNTER — Other Ambulatory Visit: Payer: Self-pay | Admitting: Internal Medicine

## 2020-04-07 DIAGNOSIS — I10 Essential (primary) hypertension: Secondary | ICD-10-CM | POA: Diagnosis not present

## 2020-04-07 DIAGNOSIS — H401133 Primary open-angle glaucoma, bilateral, severe stage: Secondary | ICD-10-CM | POA: Diagnosis not present

## 2020-04-07 DIAGNOSIS — H2513 Age-related nuclear cataract, bilateral: Secondary | ICD-10-CM | POA: Diagnosis not present

## 2020-04-13 ENCOUNTER — Telehealth (INDEPENDENT_AMBULATORY_CARE_PROVIDER_SITE_OTHER): Payer: Medicare HMO | Admitting: Psychology

## 2020-04-13 ENCOUNTER — Encounter: Payer: Self-pay | Admitting: Internal Medicine

## 2020-04-13 DIAGNOSIS — F419 Anxiety disorder, unspecified: Secondary | ICD-10-CM | POA: Diagnosis not present

## 2020-04-13 DIAGNOSIS — F3289 Other specified depressive episodes: Secondary | ICD-10-CM | POA: Diagnosis not present

## 2020-04-17 ENCOUNTER — Encounter (INDEPENDENT_AMBULATORY_CARE_PROVIDER_SITE_OTHER): Payer: Self-pay | Admitting: Family Medicine

## 2020-04-17 ENCOUNTER — Ambulatory Visit (INDEPENDENT_AMBULATORY_CARE_PROVIDER_SITE_OTHER): Payer: Medicare HMO | Admitting: Family Medicine

## 2020-04-17 ENCOUNTER — Other Ambulatory Visit: Payer: Self-pay

## 2020-04-17 VITALS — BP 145/90 | HR 71 | Temp 97.4°F | Ht 65.0 in | Wt 218.0 lb

## 2020-04-17 DIAGNOSIS — E559 Vitamin D deficiency, unspecified: Secondary | ICD-10-CM

## 2020-04-17 DIAGNOSIS — Z794 Long term (current) use of insulin: Secondary | ICD-10-CM | POA: Diagnosis not present

## 2020-04-17 DIAGNOSIS — E1169 Type 2 diabetes mellitus with other specified complication: Secondary | ICD-10-CM | POA: Diagnosis not present

## 2020-04-17 DIAGNOSIS — I152 Hypertension secondary to endocrine disorders: Secondary | ICD-10-CM | POA: Diagnosis not present

## 2020-04-17 DIAGNOSIS — E538 Deficiency of other specified B group vitamins: Secondary | ICD-10-CM | POA: Diagnosis not present

## 2020-04-17 DIAGNOSIS — E1122 Type 2 diabetes mellitus with diabetic chronic kidney disease: Secondary | ICD-10-CM

## 2020-04-17 DIAGNOSIS — E1159 Type 2 diabetes mellitus with other circulatory complications: Secondary | ICD-10-CM | POA: Diagnosis not present

## 2020-04-17 DIAGNOSIS — Z6836 Body mass index (BMI) 36.0-36.9, adult: Secondary | ICD-10-CM

## 2020-04-17 DIAGNOSIS — Z9189 Other specified personal risk factors, not elsewhere classified: Secondary | ICD-10-CM

## 2020-04-18 LAB — HEMOGLOBIN A1C
Est. average glucose Bld gHb Est-mCnc: 151 mg/dL
Hgb A1c MFr Bld: 6.9 % — ABNORMAL HIGH (ref 4.8–5.6)

## 2020-04-18 LAB — VITAMIN D 25 HYDROXY (VIT D DEFICIENCY, FRACTURES): Vit D, 25-Hydroxy: 44.6 ng/mL (ref 30.0–100.0)

## 2020-04-18 LAB — BASIC METABOLIC PANEL
BUN/Creatinine Ratio: 19 (ref 9–23)
BUN: 20 mg/dL (ref 6–24)
CO2: 24 mmol/L (ref 20–29)
Calcium: 11.9 mg/dL — ABNORMAL HIGH (ref 8.7–10.2)
Chloride: 104 mmol/L (ref 96–106)
Creatinine, Ser: 1.06 mg/dL — ABNORMAL HIGH (ref 0.57–1.00)
GFR calc Af Amer: 69 mL/min/{1.73_m2} (ref >59)
GFR calc non Af Amer: 60 mL/min/{1.73_m2} (ref >59)
Glucose: 74 mg/dL (ref 65–99)
Potassium: 4.2 mmol/L (ref 3.5–5.2)
Sodium: 142 mmol/L (ref 134–144)

## 2020-04-18 LAB — VITAMIN B12: Vitamin B-12: 777 pg/mL (ref 232–1245)

## 2020-04-18 NOTE — Progress Notes (Signed)
Chief Complaint:   OBESITY Sarah [name] is here to discuss her progress with her obesity treatment plan along with follow-up of her obesity related diagnoses. Sarah [name] is on the Category 1 Plan and states she is following her eating plan approximately 70% of the time. Sarah [name] states she is walking for 60-120 minutes 5 times per week.  Today's visit was #: 5 Starting weight: 228 lbs Starting date: 02/15/2020 Today's weight: 218 lbs Today's date: 04/17/2020 Total lbs lost to date: 10 lbs Total lbs lost since last in-office visit: 4 lbs Total weight loss percentage to date: -4.39%  Interim History: Sarah [name] says that the past 2 weeks went well with no issues.  She met with Dr. Mallie Mussel, and Londa says she helped a lot.  Denies issues.  She says she is scared about the holidays coming up.   Assessment/Plan:   Orders Placed This Encounter  Procedures   Basic metabolic panel   Hemoglobin A1c   VITAMIN D 25 Hydroxy (Vit-D Deficiency, Fractures)   Vitamin B12    1. Type 2 diabetes mellitus with other specified complication, with long-term current use of insulin ([place]) Discussed labs with patient today.  FBS 89, 60, 58, and she was scared and laid down.  She called her PCP's office and they told her to decrease her insulin amount.  Plan:  Decrease insulin dose from 44 to 40 and decrease every 3 days by 2 units to a goal FBS of 110-120 ideally.  Continue Trulicity, glimepiride, and metformin at current doses per PCP/ specialist.  Check A1c, BMP today.  Lab Results  Component Value Date   HGBA1C 6.9 (H) 04/17/2020   HGBA1C 9.0 (H) 01/11/2020   HGBA1C 9.8 (A) 09/23/2019   Lab Results  Component Value Date   MICROALBUR 80 11/13/2016   LDLCALC 87 02/15/2020   CREATININE 1.06 (H) 04/17/2020   - Basic metabolic panel - Hemoglobin A1c    2. Hypertension associated with type 2 diabetes mellitus ([place]) Discussed labs with patient today.  Review: taking medications as instructed, no  medication side effects noted, no chest pain on exertion, no dyspnea on exertion, no swelling of ankles.  Blood pressure at home 152/101, 108/72.  Lowest 108/72 and highest 152/89.  She is taking her HCTZ, Cozaar, and metoprolol.  Plan:  Blood pressure is not at goal today.  BP Readings from Last 3 Encounters:  04/17/20 (!) 145/90  03/29/20 130/78  03/15/20 111/69     3. Type 2 diabetes mellitus with chronic kidney disease, with long-term current use of insulin, unspecified CKD stage ([place]) Discussed labs with patient today.  Will check BMP and GFR today.    Lab Results  Component Value Date   CREATININE 1.06 (H) 04/17/2020   CREATININE 1.10 (H) 01/11/2020   CREATININE 1.26 (H) 12/29/2019   Lab Results  Component Value Date   CREATININE 1.06 (H) 04/17/2020   BUN 20 04/17/2020   NA 142 04/17/2020   K 4.2 04/17/2020   CL 104 04/17/2020   CO2 24 04/17/2020   - Basic metabolic panel - Hemoglobin A1c    4. Vitamin D deficiency Discussed labs with patient today.  Sarah [name]'s Vitamin D level was 38.2 on 02/15/2020. She is currently taking prescription vitamin D 50,000 IU each week. She denies nausea, vomiting or muscle weakness.  Plan:  Continue vitamin D 50,000 IU weekly, as per below.  Check vitamin D level today.  - VITAMIN D 25 Hydroxy (Vit-D Deficiency, Fractures)    5.  B12 deficiency Discussed labs with patient today.  Sarah [name] notes fatigue. She is not a vegetarian.  She does not have a previous diagnosis of pernicious anemia.  She does not have a history of weight loss surgery.  She is taking a vitamin B12 supplement daily.  Plan:  Continue vitamin B12 daily.  Will check B12 level today.  Lab Results  Component Value Date   [id number] 04/17/2020   - Vitamin B12    6. At risk for hypoglycemia Sarah [name] was given approximately 28 minutes of counseling today regarding prevention of hypoglycemia.  She was advised of symptoms of hypoglycemia.  Sarah [name] was instructed to  avoid skipping meals, and to eat regular protein-rich meals as prescribed on the meal plan.  Have readily available- low calorie snacks as needed ie- [employer] fruit snack packs if BS goes too low.     7. Class 2 severe obesity with serious comorbidity and body mass index (BMI) of 36.0 to 36.9 in adult, unspecified obesity type (HCC)  Sarah [name] is currently in the action stage of change. As such, her goal is to continue with weight loss efforts. She has agreed to the Category 1 Plan.   Exercise goals: As is.  Behavioral modification strategies: no skipping meals (occasionally skips breakfast) and keeping healthy foods in the home.  Sarah [name] has agreed to follow-up with our clinic in 2 weeks. She was informed of the importance of frequent follow-up visits to maximize her success with intensive lifestyle modifications for her multiple health conditions.   Sarah [name] was informed we would discuss her lab results at her next visit unless there is a critical issue that needs to be addressed sooner. Kimberlee agreed to keep her next visit at the agreed upon time to discuss these results.   Objective:   Blood pressure (!) 145/90, pulse 71, temperature (!) 97.4 F (36.3 C), height _0  (1.651 m), weight 218 lb (98.9 kg), SpO2 96 %. Body mass index is 36.28 kg/m.  General: Cooperative, alert, well developed, in no acute distress. HEENT: Conjunctivae and lids unremarkable. Cardiovascular: Regular rhythm.  Lungs: Normal work of breathing. Neurologic: No focal deficits.   Lab Results  Component Value Date   CREATININE 1.06 (H) 04/17/2020   BUN 20 04/17/2020   NA 142 04/17/2020   K 4.2 04/17/2020   CL 104 04/17/2020   CO2 24 04/17/2020   Lab Results  Component Value Date   ALT 36 01/11/2020   AST 35 01/11/2020   ALKPHOS 53 01/11/2020   BILITOT 1.0 01/11/2020   Lab Results  Component Value Date   HGBA1C 6.9 (H) 04/17/2020   HGBA1C 9.0 (H) 01/11/2020   HGBA1C 9.8 (A) 09/23/2019   HGBA1C 9.3  (A) 03/22/2019   HGBA1C 10.8 (H) 12/04/2018   Lab Results  Component Value Date   TSH 0.938 02/15/2020   Lab Results  Component Value Date   CHOL 164 02/15/2020   HDL 39 (L) 02/15/2020   LDLCALC 87 02/15/2020   TRIG 227 (H) 02/15/2020   CHOLHDL 4.0 12/04/2018   Lab Results  Component Value Date   WBC 6.9 02/03/2020   HGB 13.3 02/03/2020   HCT 41.1 02/03/2020   MCV 92.2 02/03/2020   PLT 270 02/03/2020   Lab Results  Component Value Date   IRON 43 08/18/2010   TIBC 389 08/18/2010   FERRITIN 227 08/18/2010   Attestation Statements:   Reviewed by clinician on day of visit: allergies, medications, problem list, medical history, surgical history, family history,  social history, and previous encounter notes.  I, Water quality scientist, CMA, am acting as Location manager for [employer], DO.  I have reviewed the above documentation for accuracy and completeness, and I agree with the above. Marjory Sneddon, D.O.  The [place] was signed into law in 2016 which includes the topic of electronic health records.  This provides immediate access to information in MyChart.  This includes consultation notes, operative notes, office notes, lab results and pathology reports.  If you have any questions about what you read please let us know at your next visit so we can discuss your concerns and take corrective action if need be.  We are right here with you.

## 2020-04-28 ENCOUNTER — Encounter: Payer: Self-pay | Admitting: Internal Medicine

## 2020-04-28 ENCOUNTER — Telehealth: Payer: Medicare HMO | Admitting: Internal Medicine

## 2020-04-28 ENCOUNTER — Ambulatory Visit (HOSPITAL_BASED_OUTPATIENT_CLINIC_OR_DEPARTMENT_OTHER): Payer: Medicare HMO | Admitting: Pharmacist

## 2020-04-28 ENCOUNTER — Other Ambulatory Visit: Payer: Self-pay

## 2020-04-28 ENCOUNTER — Ambulatory Visit: Payer: Medicare HMO | Attending: Internal Medicine | Admitting: Internal Medicine

## 2020-04-28 VITALS — BP 125/85 | HR 67 | Wt 220.4 lb

## 2020-04-28 DIAGNOSIS — I10 Essential (primary) hypertension: Secondary | ICD-10-CM

## 2020-04-28 DIAGNOSIS — E559 Vitamin D deficiency, unspecified: Secondary | ICD-10-CM | POA: Diagnosis not present

## 2020-04-28 DIAGNOSIS — Z23 Encounter for immunization: Secondary | ICD-10-CM

## 2020-04-28 DIAGNOSIS — E669 Obesity, unspecified: Secondary | ICD-10-CM

## 2020-04-28 DIAGNOSIS — E1169 Type 2 diabetes mellitus with other specified complication: Secondary | ICD-10-CM | POA: Diagnosis not present

## 2020-04-28 MED ORDER — LANTUS SOLOSTAR 100 UNIT/ML ~~LOC~~ SOPN: PEN_INJECTOR | SUBCUTANEOUS | 1 refills | Status: DC

## 2020-04-28 NOTE — Progress Notes (Signed)
Patient ID: Sarah [name], female    DOB: December 22, 1965  MRN: [id number]  CC: Follow-up   Subjective: Sarah [name] is a 54 y.o. female who presents for chronic ds management Her concerns today include:  DM with peripheral neuropathy(more so due to chemo), HTN, HL, OSA,Vit D def,midline LBP,stage IIIc low-grade serous carcinoma of the ovaryS/p BSO, omentectomy, appendectomy on 07/28/14.(S/p adjuvant chemotherapy with 6 cycles of paclitaxel and carboplatin completed on 12/08/14. S/p an ex lap, hernia repair and small bowel resection for a presumed recurrence (benign pathology), Hypercalcemia with elevated free light chains/likely MGUS (followed by [name]), 1.1 cm RLL nodule (bx negative 01/2020), RT thyroid nodule (does not met criteria for bx 10/2019)   Hypercalcemia: Full work-up so far has not rendered a cause.  She is seeing the oncologist Dr. Annamaria Boots because she had elevated serum kappa free light chains but this is thought to be reactive.  Work-up included: normalPTH(on low end normal range), normal TSH, andnormal D 1,25 (OH)2 and low VitD 25(OH)2, rpPTH was not elevated.  Repeat vitamin D level was low so she is currently on high-dose vitamin D. - PET scan was negative for ovarian cancer recurrence, but she has a 16m right lung nodule which was biopsied and was negative for cancer cells.   -SPEP showed Ig Kappa free light chain elevation to 40.2, and increased kappa/lambda ratio; M spike was not detected. -Bone Marrow biopsy which showed normocellular bone marrow for age with trilineage hematopoiesis and 3% plasma cell.    DM/Obesity:  Tolerating Trulicity. Done 12 lbs since August [facility].  Learning how to eat differently. Doing more walking by working [employer] Dose insulin dec to 40 units from 50 units due to hypoglycemia Checking TID.  Before BF 60-99.    Lab Results  Component Value Date   HGBA1C 6.9 (H) 04/17/2020   HTN:  Checks BP daily. Compliant  with meds No chest pains or shortness of breath Patient Active Problem List   Diagnosis Date Noted  . Type 2 diabetes mellitus with chronic kidney disease, with long-term current use of insulin ([place] 04/17/2020  . At risk for hypoglycemia 04/17/2020  . B12 deficiency 02/29/2020  . Diabetes mellitus ([place] 02/15/2020  . Vitamin D deficiency 02/15/2020  . Other insomnia 01/24/2020  . Class 3 severe obesity due to excess calories with serious comorbidity and body mass index (BMI) of 45.0 to 49.9 in adult ([facility] 01/24/2020  . Elevated serum immunoglobulin free light chains 01/21/2020  . 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[place] 01/05/2020  . Thyroid nodule 10/14/2019  . Renal insufficiency 06/24/2019  . Hypercalcemia 06/24/2019  . Hiatal hernia with GERD without esophagitis 06/18/2017  . Need for influenza vaccination 02/17/2017  . Ventral hernia 01/24/2016  . Type 2 diabetes mellitus without complication, without long-term current use of insulin ([place] 10/04/2015  . Obesity (BMI 30-39.9) 10/04/2015  . Hyperlipidemia associated with type 2 diabetes mellitus ([facility] 07/27/2015  . Neuropathic pain of both legs 07/27/2015  . Obesity (BMI 30.0-34.9) 12/24/2014  . Genetic testing 11/01/2014  . Constipation - functional 09/24/2014  . Chemotherapy-induced peripheral neuropathy ([facility] 09/24/2014  . Midline low back pain without sciatica 09/24/2014  . Family history of colon cancer   . Ovarian cancer, right ([place] 07/08/2014  . Hilar adenopathy   . Hypertension associated with diabetes ([place]   . Right lower lobe pulmonary nodule      Current Outpatient Medications on File Prior to Visit  Medication Sig Dispense Refill  .  ACCU-CHEK AVIVA PLUS test strip USE AS DIRECTED 300 strip 0  . albuterol (VENTOLIN HFA) 108 (90 Base) MCG/ACT inhaler Inhale 2 puffs into the lungs every 6 (six) hours as needed for wheezing or shortness of breath. 8 g 0  . amLODipine (NORVASC) 10 MG tablet Take 1 tablet (10 mg total) by mouth  daily. 90 tablet 3  . Blood Glucose Monitoring Suppl (ACCU-CHEK AVIVA PLUS) w/Device KIT USE AS DIRECTED 1 kit 0  . Blood Glucose Monitoring Suppl (ACCU-CHEK AVIVA) device Use as instructed 1 each 0  . diclofenac sodium (VOLTAREN) 1 % GEL Apply 2 g topically 4 (four) times daily. (Patient taking differently: Apply 2 g topically 4 (four) times daily as needed (back pain.). ) 100 g 3  . Dulaglutide (TRULICITY) 2.40 XB/3.5HG SOPN Inject 0.5 mLs (0.75 mg total) into the skin once a week. 6 mL 3  . gabapentin (NEURONTIN) 600 MG tablet Take 1 tablet (600 mg total) by mouth 3 (three) times daily. 270 tablet 3  . glimepiride (AMARYL) 4 MG tablet Take 1 tablet (4 mg total) by mouth daily with breakfast. 90 tablet 3  . hydrALAZINE (APRESOLINE) 25 MG tablet Take 1.5 tablets (37.5 mg total) by mouth 3 (three) times daily. 405 tablet 3  . hydrochlorothiazide (HYDRODIURIL) 25 MG tablet Take 0.5 tablets (12.5 mg total) by mouth daily. 90 tablet 3  . insulin glargine (LANTUS SOLOSTAR) 100 UNIT/ML Solostar Pen INJECT 30 UNITS DAILY. INCREASE BY 2 UNITS EVERY 3 DAYS UNTIL MORNING SUGAR IS LESS THAN 130. MAX OF 40 UNITS. (Patient taking differently: 50 Units. INJECT 30 UNITS DAILY. INCREASE BY 2 UNITS EVERY 3 DAYS UNTIL MORNING SUGAR IS LESS THAN 130. MAX OF 40 UNITS.) 45 mL 1  . Insulin Pen Needle 29G X 12MM MISC Use as directed. 100 each 6  . Lancets (ACCU-CHEK MULTICLIX) lancets   0  . Lancets (ACCU-CHEK SOFT TOUCH) lancets Use as instructed 100 each 12  . Lancets Misc. (ACCU-CHEK SOFTCLIX LANCET DEV) KIT Use as directed 1 kit 0  . losartan (COZAAR) 50 MG tablet Take 1 tablet (50 mg total) by mouth daily. 90 tablet 1  . meloxicam (MOBIC) 15 MG tablet TAKE 1 TABLET EVERY DAY 90 tablet 1  . metFORMIN (GLUCOPHAGE) 1000 MG tablet TAKE 1 TABLET TWICE DAILY WITH MEALS 180 tablet 1  . methylPREDNISolone (MEDROL DOSEPAK) 4 MG TBPK tablet 6 day dose pack - take as directed 21 tablet 0  . metoprolol tartrate (LOPRESSOR) 25  MG tablet TAKE 1/2 TABLET TWICE DAILY 90 tablet 1  . rosuvastatin (CRESTOR) 20 MG tablet TAKE 1 TABLET EVERY DAY. STOP ATORVASTATIN 90 tablet 3  . timolol (BETIMOL) 0.5 % ophthalmic solution Place 1 drop into both eyes daily.    . Travoprost, BAK Free, (TRAVATAN) 0.004 % SOLN ophthalmic solution Place 1 drop into both eyes at bedtime.    . vitamin B-12 (CYANOCOBALAMIN) 500 MCG tablet Take 1 tablet (500 mcg total) by mouth daily.    . Vitamin D, Ergocalciferol, (DRISDOL) 1.25 MG (50000 UNIT) CAPS capsule Take 1 capsule (50,000 Units total) by mouth every 7 (seven) days. 4 capsule 0  . [DISCONTINUED] blood glucose meter kit and supplies Dispense based on patient and insurance preference. Use up to four times daily as directed. (FOR ICD-9 250.00, 250.01). 1 each 0   No current facility-administered medications on file prior to visit.    Allergies  Allergen Reactions  . Emend [Aprepitant] Other (See Comments)    Urticaria   .  Lisinopril Cough    Social History   Socioeconomic History  . Marital status: Married    Spouse name: Not on file  . Number of children: 2  . Years of education: Not on file  . Highest education level: High school graduate  Occupational History  . Occupation: disability  Tobacco Use  . Smoking status: Never Smoker  . Smokeless tobacco: Never Used  Vaping Use  . Vaping Use: Never used  Substance and Sexual Activity  . Alcohol use: No  . Drug use: No  . Sexual activity: Not on file  Other Topics Concern  . Not on file  Social History Narrative   No issues with transportation.    Attends church   Social Determinants of Radio broadcast assistant Strain:   . Difficulty of Paying Living Expenses: Not on file  Food Insecurity:   . Worried About Charity fundraiser in the Last Year: Not on file  . Ran Out of Food in the Last Year: Not on file  Transportation Needs:   . Lack of Transportation (Medical): Not on file  . Lack of Transportation  (Non-Medical): Not on file  Physical Activity:   . Days of Exercise per Week: Not on file  . Minutes of Exercise per Session: Not on file  Stress:   . Feeling of Stress : Not on file  Social Connections:   . Frequency of Communication with Friends and Family: Not on file  . Frequency of Social Gatherings with Friends and Family: Not on file  . Attends Religious Services: Not on file  . Active Member of Clubs or Organizations: Not on file  . Attends Archivist Meetings: Not on file  . Marital Status: Not on file  Intimate Partner Violence:   . Fear of Current or Ex-Partner: Not on file  . Emotionally Abused: Not on file  . Physically Abused: Not on file  . Sexually Abused: Not on file    Family History  Problem Relation Age of Onset  . Hypertension Mother   . Hyperlipidemia Mother   . Stroke Mother   . Thyroid disease Mother   . Hypertension Father   . Diabetes Father   . Hyperlipidemia Father   . Sudden death Father   . Cancer Sister 50       fibrosarcoma (back); currently 63  . Prostate cancer Maternal Uncle   . Colon cancer Paternal Aunt        Dx 61s; deceased 43s  . Prostate cancer Paternal Uncle        currently 57  . Cancer Paternal Uncle 68       "bone" ; unk. primary  . Stomach cancer Paternal Uncle     Past Surgical History:  Procedure Laterality Date  . ABDOMINAL HYSTERECTOMY    . FOOT SURGERY  04/2018   [facility]  . INCISIONAL HERNIA REPAIR N/A 07/06/2015   Procedure: INCISIONAL HERNIA REPAIR ;  Surgeon: Rolm Bookbinder, MD;  Location: [facility];  Service: General;  Laterality: N/A;  . INSERTION OF MESH N/A 07/06/2015   Procedure: WITH INSERTION OF PHASIX ST MESH;  Surgeon: Rolm Bookbinder, MD;  Location: [facility];  Service: General;  Laterality: N/A;  . LAPAROTOMY N/A 07/06/2015   Procedure: EXPLORATORY LAPAROTOMY;  Surgeon: Everitt Amber, MD;  Location: [facility];  Service: Gynecology;  Laterality: N/A;  . LYSIS OF ADHESION N/A 07/06/2015    Procedure:  LYSIS OF ADHESION RESECTION OF MESENTERIC MASS BOWEL RESECTION ;  Surgeon: Everitt Amber, MD;  Location: [facility];  Service: Gynecology;  Laterality: N/A;  . ROBOTIC ASSISTED TOTAL HYSTERECTOMY WITH BILATERAL SALPINGO OOPHERECTOMY Bilateral 07/28/2014   Procedure: ROBOTIC ASSISTED lysis of adhesions with biopsies, converted to LAPAROTOMY, bilateral salpingoorphorectomy, omentectomy,appendectomy;  Surgeon: Janie Morning, MD;  Location: [facility];  Service: Gynecology;  Laterality: Bilateral;  . THYROIDECTOMY, PARTIAL    . VIDEO BRONCHOSCOPY N/A 01/13/2020   Procedure: VIDEO BRONCHOSCOPY WITH BIOPSIES;  Surgeon: Melrose Nakayama, MD;  Location: [facility];  Service: Thoracic;  Laterality: N/A;    ROS: Review of Systems Negative except as stated above  PHYSICAL EXAM: BP 125/85 (BP Location: Left Arm, Patient Position: Sitting)   Pulse 67   Wt 220 lb 6.4 oz (100 kg)   LMP  (LMP Unknown)   SpO2 100%   BMI 36.68 kg/m   Wt Readings from Last 3 Encounters:  04/28/20 220 lb 6.4 oz (100 kg)  04/17/20 218 lb (98.9 kg)  03/29/20 222 lb (100.7 kg)    Physical Exam  General appearance - alert, well appearing, and in no distress Mental status - normal mood, behavior, speech, dress, motor activity, and thought processes Mouth - mucous membranes moist, pharynx normal without lesions Neck - supple, no significant adenopathy Chest - clear to auscultation, no wheezes, rales or rhonchi, symmetric air entry Heart - normal rate, regular rhythm, normal S1, S2, no murmurs, rubs, clicks or gallops Extremities - peripheral pulses normal, no pedal edema, no clubbing or cyanosis   Depression screen [facility] 2/9 04/28/2020 02/15/2020 02/15/2020 01/28/2020 01/24/2020  Decreased Interest 0 2 3 0 0  Down, Depressed, Hopeless 0 3 1 0 0  PHQ - 2 Score 0 5 4 0 0  Altered sleeping - 3 0 - -  Tired, decreased energy - 3 2 - -  Change in appetite - 2 2 - -  Feeling bad or failure about yourself  - 3 0 - -  Trouble  concentrating - 0 0 - -  Moving slowly or fidgety/restless - 0 0 - -  Suicidal thoughts - 0 - - -  PHQ-9 Score - 16 8 - -  Some recent data might be hidden    CMP Latest Ref Rng & Units 04/17/2020 01/11/2020 12/29/2019  Glucose 65 - 99 mg/dL 74 168(H) 159(H)  BUN 6 - 24 mg/dL 20 17 21(H)  Creatinine 0.57 - 1.00 mg/dL 1.06(H) 1.10(H) 1.26(H)  Sodium 134 - 144 mmol/L 142 137 141  Potassium 3.5 - 5.2 mmol/L 4.2 3.9 3.9  Chloride 96 - 106 mmol/L 104 101 104  CO2 20 - 29 mmol/L 24 24 25   Calcium 8.7 - 10.2 mg/dL 11.9(H) 10.7(H) 11.7(H)  Total Protein 6.5 - 8.1 g/dL - 7.7 8.3(H)  Total Bilirubin 0.3 - 1.2 mg/dL - 1.0 0.6  Alkaline Phos 38 - 126 U/L - 53 60  AST 15 - 41 U/L - 35 29  ALT 0 - 44 U/L - 36 33   Lipid Panel     Component Value Date/Time   CHOL 164 02/15/2020 1104   TRIG 227 (H) 02/15/2020 1104   HDL 39 (L) 02/15/2020 1104   CHOLHDL 4.0 12/04/2018 0914   CHOLHDL 5.1 (H) 08/14/2016 1729   VLDL 74 (H) 08/14/2016 1729   LDLCALC 87 02/15/2020 1104    CBC    Component Value Date/Time   WBC 6.9 02/03/2020 0837   WBC 6.8 01/11/2020 1017   RBC 4.46 02/03/2020 0837   HGB 13.3 02/03/2020 0837  HGB 13.6 12/04/2018 0914   HGB 12.9 04/22/2016 0750   HCT 41.1 02/03/2020 0837   HCT 41.0 12/04/2018 0914   HCT 39.0 04/22/2016 0750   PLT 270 02/03/2020 0837   PLT 332 12/04/2018 0914   MCV 92.2 02/03/2020 0837   MCV 88 12/04/2018 0914   MCV 92.2 04/22/2016 0750   MCH 29.8 02/03/2020 0837   MCHC 32.4 02/03/2020 0837   RDW 13.5 02/03/2020 0837   RDW 13.6 12/04/2018 0914   RDW 13.9 04/22/2016 0750   LYMPHSABS 2.6 02/03/2020 0837   LYMPHSABS 2.7 04/22/2016 0750   MONOABS 0.8 02/03/2020 0837   MONOABS 1.0 (H) 04/22/2016 0750   EOSABS 0.2 02/03/2020 0837   EOSABS 0.1 04/22/2016 0750   BASOSABS 0.0 02/03/2020 0837   BASOSABS 0.0 04/22/2016 0750    ASSESSMENT AND PLAN:  1. Type 2 diabetes mellitus with obesity (HCC) Controlled.  She is experiencing some morning  hypoglycemia.  Recommend decreasing Lantus to 37 units daily.  If she continues to have morning hypoglycemia she will let me know so that we can reduce the dose even more.  Continue healthy eating habits and regular exercise - insulin glargine (LANTUS SOLOSTAR) 100 UNIT/ML Solostar Pen; Inject 37 units daily subcut  Dispense: 45 mL; Refill: 1  2. Essential hypertension Not at goal.  However on review of blood pressure readings on recent visits with other providers blood pressure has been good.  3. Hypercalcemia Will refer to endocrine to see whether she has some type of familial hypercalcemia - Ambulatory referral to Endocrinology  4. Obesity (BMI 30-39.9) See #1 above  5. Vitamin D deficiency On high-dose vitamin D   Patient was given the opportunity to ask questions.  Patient verbalized understanding of the plan and was able to repeat key elements of the plan.   No orders of the defined types were placed in this encounter.    Requested Prescriptions    No prescriptions requested or ordered in this encounter    No follow-ups on file.  Karle Plumber, MD, FACP

## 2020-04-28 NOTE — Progress Notes (Signed)
Follow up no other issues Here for 2nd Shingles injection

## 2020-04-28 NOTE — Progress Notes (Signed)
Patient presents for vaccination against zoster per orders of Dr. Wynetta Emery. Consent given. Counseling provided. No contraindications exists. Vaccine administered without incident.   Benard Halsted, PharmD, [place] [phone]

## 2020-04-28 NOTE — Patient Instructions (Signed)
Decrease Lantus to 37 units daily

## 2020-05-01 ENCOUNTER — Ambulatory Visit (INDEPENDENT_AMBULATORY_CARE_PROVIDER_SITE_OTHER): Payer: Medicare HMO | Admitting: Family Medicine

## 2020-05-01 ENCOUNTER — Encounter (INDEPENDENT_AMBULATORY_CARE_PROVIDER_SITE_OTHER): Payer: Self-pay | Admitting: Family Medicine

## 2020-05-01 ENCOUNTER — Other Ambulatory Visit: Payer: Self-pay

## 2020-05-01 VITALS — BP 123/83 | HR 72 | Temp 97.9°F | Ht 65.0 in | Wt 218.0 lb

## 2020-05-01 DIAGNOSIS — Z6836 Body mass index (BMI) 36.0-36.9, adult: Secondary | ICD-10-CM | POA: Diagnosis not present

## 2020-05-01 DIAGNOSIS — E1169 Type 2 diabetes mellitus with other specified complication: Secondary | ICD-10-CM | POA: Diagnosis not present

## 2020-05-01 DIAGNOSIS — E559 Vitamin D deficiency, unspecified: Secondary | ICD-10-CM

## 2020-05-01 DIAGNOSIS — N183 Chronic kidney disease, stage 3 unspecified: Secondary | ICD-10-CM

## 2020-05-01 DIAGNOSIS — Z794 Long term (current) use of insulin: Secondary | ICD-10-CM

## 2020-05-01 DIAGNOSIS — N1831 Chronic kidney disease, stage 3a: Secondary | ICD-10-CM | POA: Insufficient documentation

## 2020-05-01 MED ORDER — TRULICITY 1.5 MG/0.5ML ~~LOC~~ SOAJ: 1.5000 mg | SUBCUTANEOUS | 0 refills | Status: DC

## 2020-05-03 NOTE — Progress Notes (Signed)
Chief Complaint:   OBESITY Sarah [name] is here to discuss her progress with her obesity treatment plan along with follow-up of her obesity related diagnoses. Sarah [name] is on the Category 1 Plan and states she is following her eating plan approximately 80% of the time. Sarah [name] states she is active while being intentional about her walks when she works insta-[employer] 90-120 minutes 5 times per week.  Today's visit was #: 6 Starting weight: 228 lbs Starting date: 02/15/2020 Today's weight: 218 lbs Today's date: 05/01/2020 Total lbs lost to date: 10 lbs Total lbs lost since last in-office visit: 0 lbs  Interim History: Sarah [name] reports that she did better than prior weeks. She had better portion control and was intentional with eating. She states that she is still hungry after eating. She is here today to review recent blood work as well (A1c, Vit D, and CMP).  Plan: We will increase and change to the Category 2 Plan and [place] was given "protein equivalent" sheets.   Assessment/Plan:   1. Type 2 diabetes mellitus with other specified complication, with long-term current use of insulin (Sarah [name]) Discussed labs with patient today.  Lab Results  Component Value Date   HGBA1C 6.9 (H) 04/17/2020   HGBA1C 9.0 (H) 01/11/2020   HGBA1C 9.8 (A) 09/23/2019   No results found for: INSULIN Lab Results  Component Value Date   MICROALBUR 80 11/13/2016   LDLCALC 87 02/15/2020   CREATININE 1.06 (H) 04/17/2020   Sarah [name] PCP decreased her insulin dose to 37 units. Her fasting blood sugars are 90 to 103. She denies family history of thyroid cancer and no personal history of pancreatitis. Sarah [name] has a substantial decrease in her A1c from when she started the program.    Plan:  Reviewed with pt:  - Counseled patient on pathophysiology of disease and discussed good blood sugar control is important to decrease the likelihood of diabetic complications such as nephropathy, neuropathy, limb loss, blindness, coronary  artery disease, and death.  Intensive lifestyle modification including diet, exercise and weight loss are the first line of treatment for diabetes.    - Continue home blood sugar monitoring regularly - closely- especially with continued wt loss.  Reviewed blood sugar goals.  Reminded if pt feels poorly- check BS and BP at that time.  Bring in BS and BP logs each OV. - Eat on a regular basis- no skipping or going long periods without eating.  We discussed hypoglycemia prevention. - Any concerns about medicines should be directed at the prescribing provider - Recheck labs in 3 months if not done at Endo/ PCP.  - Importance of f/up with PCP and all other specialists as scheduled was stressed to pt today - Camilia will double Trulicity dose today from 0.75 mg weekly to 1.5 mg. We will continue to closely monitor blood sugar and decrease insulin by 2-3 units every couple of days to goal fasting blood sugar is 90-110 or so! New prescription for Trulicity given to [place].  New prescription- Dulaglutide (TRULICITY) 1.5 PY/0.9XI SOPN; Inject 1.5 mg into the skin once a week.  Dispense: 2 mL; Refill: 0    2. Stage 3 chronic kidney disease, unspecified whether stage 3a or 3b CKD ([place]) Discussed labs with patient today.   Sarah [name]'s recent CMP results show decrease in serum creatinine and her GFR is now in the normal ranges. Lab Results  Component Value Date   MICROALBUR 80 11/13/2016   LDLCALC 87 02/15/2020   CREATININE 1.06 (H) 04/17/2020  Plan:  Extensively discussed importance of following prudent healthy meal plan and engaging in exercise (to increase blood flow to kidneys)  Also stressed importance of good control of blood pressure and blood sugar as it pertains to pt's chronic diseases.    Patient reminded to also avoid nephrotoxic substances.  All pt's questions/ concerns were addressed.  We will continue to monitor.    3. Vitamin D deficiency Discussed labs with patient today.  Sarah [name]  Vitamin D level was 44.6 on 04/17/2020. She is currently taking prescription vitamin D 50,000 IU each week. She denies nausea, vomiting or muscle weakness.   Plan:  Due to Sarah [name] slightly increased calcium level and her PCP recently referring her to endocrinology for hypercalcemia evaluation, we will not increase Vit D dose at this time.  She will continue current dose.  Sarah [name] Vit D is near goal. She will continue weight loss and prudent nutritional plan.     4. Class 2 severe obesity with serious comorbidity and body mass index (BMI) of 36.0 to 36.9 in adult, unspecified obesity type (HCC) Sarah [name] is currently in the action stage of change. As such, her goal is to continue with weight loss efforts. She has agreed to change to the Category 2 Plan and was given protein equivalent sheets.    Exercise goals: Sarah [name] will continue walking at work. All adults should avoid inactivity. Some physical activity is better than none, and adults who participate in any amount of physical activity gain some health benefits.   Behavioral modification strategies: increasing lean protein intake, decreasing simple carbohydrates, no skipping meals, holiday eating strategies  and celebration eating strategies.   Sarah [name] has agreed to follow-up with our clinic in 2 weeks. She was informed of the importance of frequent follow-up visits to maximize her success with intensive lifestyle modifications for her multiple health conditions.    Objective:   Blood pressure 123/83, pulse 72, temperature 97.9 F (36.6 C), height 5\' 5"  (1.651 m), weight 218 lb (98.9 kg), SpO2 98 %. Body mass index is 36.28 kg/m.  General: Cooperative, alert, well developed, in no acute distress. HEENT: Conjunctivae and lids unremarkable. Cardiovascular: Regular rhythm.  Lungs: Normal work of breathing. Neurologic: No focal deficits.   Lab Results  Component Value Date   CREATININE 1.06 (H) 04/17/2020   BUN 20 04/17/2020   NA 142  04/17/2020   K 4.2 04/17/2020   CL 104 04/17/2020   CO2 24 04/17/2020   Lab Results  Component Value Date   ALT 36 01/11/2020   AST 35 01/11/2020   ALKPHOS 53 01/11/2020   BILITOT 1.0 01/11/2020   Lab Results  Component Value Date   HGBA1C 6.9 (H) 04/17/2020   HGBA1C 9.0 (H) 01/11/2020   HGBA1C 9.8 (A) 09/23/2019   HGBA1C 9.3 (A) 03/22/2019   HGBA1C 10.8 (H) 12/04/2018   No results found for: INSULIN Lab Results  Component Value Date   TSH 0.938 02/15/2020   Lab Results  Component Value Date   CHOL 164 02/15/2020   HDL 39 (L) 02/15/2020   LDLCALC 87 02/15/2020   TRIG 227 (H) 02/15/2020   CHOLHDL 4.0 12/04/2018   Lab Results  Component Value Date   WBC 6.9 02/03/2020   HGB 13.3 02/03/2020   HCT 41.1 02/03/2020   MCV 92.2 02/03/2020   PLT 270 02/03/2020   Lab Results  Component Value Date   IRON 43 08/18/2010   TIBC 389 08/18/2010   FERRITIN 227 08/18/2010    Obesity Behavioral  Intervention:   Approximately 15 minutes were spent on the discussion below.  ASK: We discussed the diagnosis of obesity with Sarah [name] today and Sarah [name] agreed to give [place] permission to discuss obesity behavioral modification therapy today.  ASSESS: Sarah [name] has the diagnosis of obesity and her BMI today is 36.4. Sarah [name] is in the action stage of change.   ADVISE: Sarah [name] was educated on the multiple health risks of obesity as well as the benefit of weight loss to improve her health. She was advised of the need for long term treatment and the importance of lifestyle modifications to improve her current health and to decrease her risk of future health problems.  AGREE: Multiple dietary modification options and treatment options were discussed and Sarah [name] agreed to follow the recommendations documented in the above note.  ARRANGE: Sarah [name] was educated on the importance of frequent visits to treat obesity as outlined per CMS and USPSTF guidelines and agreed to schedule her next follow up  appointment today.  Attestation Statements:   Reviewed by clinician on day of visit: allergies, medications, problem list, medical history, surgical history, family history, social history, and previous encounter notes.  Coral Ceo, am acting as Location manager for [employer], DO.  I have reviewed the above documentation for accuracy and completeness, and I agree with the above. Marjory Sneddon, D.O.  The [place] was signed into law in 2016 which includes the topic of electronic health records.  This provides immediate access to information in MyChart.  This includes consultation notes, operative notes, office notes, lab results and pathology reports.  If you have any questions about what you read please let us know at your next visit so we can discuss your concerns and take corrective action if need be.  We are right here with you.

## 2020-05-15 ENCOUNTER — Encounter (INDEPENDENT_AMBULATORY_CARE_PROVIDER_SITE_OTHER): Payer: Self-pay | Admitting: Family Medicine

## 2020-05-15 ENCOUNTER — Other Ambulatory Visit: Payer: Self-pay

## 2020-05-15 ENCOUNTER — Ambulatory Visit (INDEPENDENT_AMBULATORY_CARE_PROVIDER_SITE_OTHER): Payer: Medicare HMO | Admitting: Family Medicine

## 2020-05-15 VITALS — BP 117/82 | HR 76 | Temp 98.5°F | Ht 65.0 in | Wt 220.0 lb

## 2020-05-15 DIAGNOSIS — E559 Vitamin D deficiency, unspecified: Secondary | ICD-10-CM

## 2020-05-15 DIAGNOSIS — E1169 Type 2 diabetes mellitus with other specified complication: Secondary | ICD-10-CM

## 2020-05-15 DIAGNOSIS — Z6836 Body mass index (BMI) 36.0-36.9, adult: Secondary | ICD-10-CM | POA: Diagnosis not present

## 2020-05-15 DIAGNOSIS — Z794 Long term (current) use of insulin: Secondary | ICD-10-CM

## 2020-05-15 DIAGNOSIS — E1159 Type 2 diabetes mellitus with other circulatory complications: Secondary | ICD-10-CM | POA: Diagnosis not present

## 2020-05-15 DIAGNOSIS — I152 Hypertension secondary to endocrine disorders: Secondary | ICD-10-CM

## 2020-05-15 MED ORDER — VITAMIN D (ERGOCALCIFEROL) 1.25 MG (50000 UNIT) PO CAPS: 50000.0000 [IU] | ORAL_CAPSULE | ORAL | 0 refills | Status: DC

## 2020-05-15 NOTE — Progress Notes (Signed)
Chief Complaint:   OBESITY Sarah [name] is here to discuss her progress with her obesity treatment plan along with follow-up of her obesity related diagnoses. [name] is on the Category 2 Plan plus protein equivalents and states she is following her eating plan approximately 50-60% of the time. 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  Today's visit was #: 7 Starting weight: 228 lbs Starting date: 02/15/2020 Today's weight: 220 lbs Today's date: 05/21/2020 Total lbs lost to date: 8 lbs Total lbs lost since last in-office visit: +2 lbs Total weight loss percentage to date: -3.51%  Interim History: We changed Sarah [name]'s plan from Category 1 to Category 2 and she is tolerating it well. She is no longer hungry during the day but didn't stay on the plan as much as prior. She ate more due to boredem.   Plan: Start a silver sneakers exercise program and go 2-3 days a week.  Assessment/Plan:   1. Hypertension associated with type 2 diabetes mellitus ([place]) Sarah [name] is prescribed Norvasc 10 mg, HCTZ 25 mg, and Cozaar 50 mg. She reports blood pressure at home is 113/70's.  Review: taking medications as instructed, no medication side effects noted, no chest pain on exertion, no dyspnea on exertion, no swelling of ankles.   BP Readings from Last 3 Encounters:  05/15/20 117/82  05/01/20 123/83  04/28/20 125/85   Plan: Drew is working on healthy weight loss and exercise to improve blood pressure control. We will watch for signs of hypotension as she continues her lifestyle modifications.  2. Type 2 diabetes mellitus with other specified complication, with long-term current use of insulin (HCC) Sarah [name] is prescribed Trulicity 1.5 mg, Amaryl 4 mg, Metformin 1000 mg, and Lantus 100 units. She reports fasting blood sugars at home are 90-106. Patient denies lows or highs.  Medications reviewed. Diabetic ROS: no polyuria or polydipsia, no chest pain, dyspnea or TIA's, no  numbness, tingling or pain in extremities.   Lab Results  Component Value Date   HGBA1C 6.9 (H) 04/17/2020   HGBA1C 9.0 (H) 01/11/2020   HGBA1C 9.8 (A) 09/23/2019   Lab Results  Component Value Date   MICROALBUR 80 11/13/2016   LDLCALC 87 02/15/2020   CREATININE 1.06 (H) 04/17/2020   Plan: Good blood sugar control is important to decrease the likelihood of diabetic complications such as nephropathy, neuropathy, limb loss, blindness, coronary artery disease, and death. Intensive lifestyle modification including diet, exercise and weight loss are the first line of treatment for diabetes.   3. Vitamin D deficiency [name]'s Vitamin D level was 44.6 on 04/17/2020. She is currently taking prescription vitamin D 50,000 IU each week. She denies nausea, vomiting or muscle weakness.  Plan: Low Vitamin D level contributes to fatigue and are associated with obesity, breast, and colon cancer. She agrees to continue to take prescription Vitamin D @50 ,000 IU every week and will follow-up for routine testing of Vitamin D, at least 2-3 times per year to avoid over-replacement.  Refill- Vitamin D, Ergocalciferol, (DRISDOL) 1.25 MG (50000 UNIT) CAPS capsule; Take 1 capsule (50,000 Units total) by mouth every 7 (seven) days.  Dispense: 4 capsule; Refill: 0  4. Class 2 severe obesity with serious comorbidity and body mass index (BMI) of 36.0 to 36.9 in adult, unspecified obesity type (HCC) Sarah [name] is currently in the action stage of change. As such, her goal is to continue with weight loss efforts. She has agreed to the Category 2 Plan plus protein equivalents.  Exercise goals: All adults should avoid inactivity. Some physical activity is better than none, and adults who participate in any amount of physical activity gain some health benefits.  Behavioral modification strategies: meal planning and cooking strategies, keeping healthy foods in the home, ways to avoid boredom eating, emotional eating strategies  and planning for success.  [name] has agreed to follow-up with our clinic in 2 weeks. She was informed of the importance of frequent follow-up visits to maximize her success with intensive lifestyle modifications for her multiple health conditions.   Objective:   Blood pressure 117/82, pulse 76, temperature 98.5 F (36.9 C), height 5\' 5"  (1.651 m), weight 220 lb (99.8 kg), SpO2 97 %. Body mass index is 36.61 kg/m.  General: Cooperative, alert, well developed, in no acute distress. HEENT: Conjunctivae and lids unremarkable. Cardiovascular: Regular rhythm.  Lungs: Normal work of breathing. Neurologic: No focal deficits.   Lab Results  Component Value Date   CREATININE 1.06 (H) 04/17/2020   BUN 20 04/17/2020   NA 142 04/17/2020   K 4.2 04/17/2020   CL 104 04/17/2020   CO2 24 04/17/2020   Lab Results  Component Value Date   ALT 36 01/11/2020   AST 35 01/11/2020   ALKPHOS 53 01/11/2020   BILITOT 1.0 01/11/2020   Lab Results  Component Value Date   HGBA1C 6.9 (H) 04/17/2020   HGBA1C 9.0 (H) 01/11/2020   HGBA1C 9.8 (A) 09/23/2019   HGBA1C 9.3 (A) 03/22/2019   HGBA1C 10.8 (H) 12/04/2018   No results found for: INSULIN Lab Results  Component Value Date   TSH 0.938 02/15/2020   Lab Results  Component Value Date   CHOL 164 02/15/2020   HDL 39 (L) 02/15/2020   LDLCALC 87 02/15/2020   TRIG 227 (H) 02/15/2020   CHOLHDL 4.0 12/04/2018   Lab Results  Component Value Date   WBC 6.9 02/03/2020   HGB 13.3 02/03/2020   HCT 41.1 02/03/2020   MCV 92.2 02/03/2020   PLT 270 02/03/2020   Lab Results  Component Value Date   IRON 43 08/18/2010   TIBC 389 08/18/2010   FERRITIN 227 08/18/2010    Obesity Behavioral Intervention:   Approximately 15 minutes were spent on the discussion below.  ASK: We discussed the diagnosis of obesity with [name] today and [name] agreed to give [place] permission to discuss obesity behavioral modification therapy today.  ASSESS: Sarah [name] has  the diagnosis of obesity and her BMI today is 36.6. Sarah [name] is in the action stage of change.   ADVISE: Sarah [name] was educated on the multiple health risks of obesity as well as the benefit of weight loss to improve her health. She was advised of the need for long term treatment and the importance of lifestyle modifications to improve her current health and to decrease her risk of future health problems.  AGREE: Multiple dietary modification options and treatment options were discussed and Maddy agreed to follow the recommendations documented in the above note.  ARRANGE: Tyana was educated on the importance of frequent visits to treat obesity as outlined per CMS and USPSTF guidelines and agreed to schedule her next follow up appointment today.  Attestation Statements:   Reviewed by clinician on day of visit: allergies, medications, problem list, medical history, surgical history, family history, social history, and previous encounter notes.  Coral Ceo, am acting as Location manager for [employer], DO.  I have reviewed the above documentation for accuracy and completeness, and I agree with the above. -  Marjory Sneddon, D.O.  The [place] was signed into law in 2016 which includes the topic of electronic health records.  This provides immediate access to information in MyChart.  This includes consultation notes, operative notes, office notes, lab results and pathology reports.  If you have any questions about what you read please let us know at your next visit so we can discuss your concerns and take corrective action if need be.  We are right here with you.

## 2020-05-20 ENCOUNTER — Other Ambulatory Visit (INDEPENDENT_AMBULATORY_CARE_PROVIDER_SITE_OTHER): Payer: Self-pay | Admitting: Family Medicine

## 2020-05-20 DIAGNOSIS — E1169 Type 2 diabetes mellitus with other specified complication: Secondary | ICD-10-CM

## 2020-05-20 DIAGNOSIS — Z794 Long term (current) use of insulin: Secondary | ICD-10-CM

## 2020-05-22 ENCOUNTER — Other Ambulatory Visit: Payer: Self-pay | Admitting: Thoracic Surgery (Cardiothoracic Vascular Surgery)

## 2020-05-22 DIAGNOSIS — R911 Solitary pulmonary nodule: Secondary | ICD-10-CM

## 2020-05-24 ENCOUNTER — Other Ambulatory Visit: Payer: Self-pay | Admitting: Podiatry

## 2020-05-24 ENCOUNTER — Telehealth (INDEPENDENT_AMBULATORY_CARE_PROVIDER_SITE_OTHER): Payer: Self-pay

## 2020-05-24 NOTE — Telephone Encounter (Signed)
Last OV with Dr Opalski 

## 2020-05-24 NOTE — Telephone Encounter (Signed)
Pt called a refill on her Trulicity, pt uses [facility], please give them a call at [phone].

## 2020-05-30 ENCOUNTER — Encounter (INDEPENDENT_AMBULATORY_CARE_PROVIDER_SITE_OTHER): Payer: Self-pay | Admitting: Family Medicine

## 2020-05-30 ENCOUNTER — Ambulatory Visit (INDEPENDENT_AMBULATORY_CARE_PROVIDER_SITE_OTHER): Payer: Medicare HMO | Admitting: Family Medicine

## 2020-05-30 ENCOUNTER — Other Ambulatory Visit: Payer: Self-pay

## 2020-05-30 VITALS — BP 138/85 | HR 70 | Temp 98.3°F | Ht 65.0 in | Wt 218.0 lb

## 2020-05-30 DIAGNOSIS — E559 Vitamin D deficiency, unspecified: Secondary | ICD-10-CM

## 2020-05-30 DIAGNOSIS — E1169 Type 2 diabetes mellitus with other specified complication: Secondary | ICD-10-CM | POA: Diagnosis not present

## 2020-05-30 DIAGNOSIS — Z6836 Body mass index (BMI) 36.0-36.9, adult: Secondary | ICD-10-CM | POA: Diagnosis not present

## 2020-05-30 DIAGNOSIS — Z794 Long term (current) use of insulin: Secondary | ICD-10-CM | POA: Diagnosis not present

## 2020-05-30 DIAGNOSIS — I152 Hypertension secondary to endocrine disorders: Secondary | ICD-10-CM

## 2020-05-30 DIAGNOSIS — E1159 Type 2 diabetes mellitus with other circulatory complications: Secondary | ICD-10-CM | POA: Diagnosis not present

## 2020-05-30 MED ORDER — VITAMIN D (ERGOCALCIFEROL) 1.25 MG (50000 UNIT) PO CAPS: 50000.0000 [IU] | ORAL_CAPSULE | ORAL | 0 refills | Status: DC

## 2020-05-30 MED ORDER — TRULICITY 3 MG/0.5ML ~~LOC~~ SOAJ: 3.0000 mg | SUBCUTANEOUS | 0 refills | Status: DC

## 2020-05-31 NOTE — Progress Notes (Signed)
Chief Complaint:   OBESITY Woodie is here to discuss her progress with her obesity treatment plan along with follow-up of her obesity related diagnoses. Daniell is on the Category 2 Plan + protein equivalents and states she is following her eating plan approximately 60% of the time. Devony states she is walking 3-4 hours 5 times per week.  Today's visit was #: 8 Starting weight: 228 lbs Starting date: 02/15/2020 Today's weight: 218 lbs Today's date: 05/30/2020 Total lbs lost to date: 10 lbs Total lbs lost since last in-office visit: 2 lbs Total weight loss percentage to date: -4.39%  Interim History: Opel reports that she watched her portions this time and ate more proteins and less snacking. She was more aware of what she ate and when. Kyarra did check on silver sneakers and her insurance does cover it. She has not started going to the gym yet because she watches her grandchild and has no time to herself M-F to go to the gym.  Assessment/Plan:   1. Type 2 diabetes mellitus with other specified complication, with long-term current use of insulin (HCC) Sheika reports fasting blood sugars in the 90's, occasionally 87 but no lower, and the highest is 103. She feels great without concerns.  Medications reviewed. Diabetic ROS: no polyuria or polydipsia, no chest pain, dyspnea or TIA's, no numbness, tingling or pain in extremities.   Lab Results  Component Value Date   HGBA1C 6.9 (H) 04/17/2020   HGBA1C 9.0 (H) 01/11/2020   HGBA1C 9.8 (A) 09/23/2019   Lab Results  Component Value Date   MICROALBUR 80 11/13/2016   LDLCALC 87 02/15/2020   CREATININE 1.06 (H) 04/17/2020   No results found for: INSULIN  Plan: Increase Trulicity from 1.5 mg to 3.0 mg every week. New prescription provided. I discussed with Yaretzy that she will have to keep vigilant and decrease insulin, per PCP recommendations, if she develops any lows or issues. I recommended if she develops any concerning symptoms,  she should call her PCP regarding her insulin dosing and also decrease insulin by 3-5 units every 2-3 days. Hypoglycemia management discussed with patient.   Good blood sugar control is important to decrease the likelihood of diabetic complications such as nephropathy, neuropathy, limb loss, blindness, coronary artery disease, and death. Intensive lifestyle modification including diet, exercise and weight loss are the first line of treatment for diabetes.   2. Hypertension associated with Type 2 diabetes mellitus ([place]) Laquanta's blood pressure is above goal today of <130/80. Her blood pressures at home are in the 130's-140's/80-90's. She denies symptoms or concerns. Laelynn is on Norvasc, HCTZ, and lopressor and tolerating them well.  Plan: Rozlyn's blood pressure is slightly above goal today. Her medication is managed by her PCP. I advised patient to contact PCP regarding dose changes if her blood pressure is not at goal of 130/80 or less on a regular basis. Decrease salt, continue prudent nutritional plan, weight loss, and close monitoring recommended.   3. Vitamin D deficiency Shaunta's Vitamin D level was 44.6 on 04/17/2020. She is currently taking prescription vitamin D 50,000 IU each week. She denies nausea, vomiting or muscle weakness.   Ref. Range 04/17/2020 09:28  Vitamin D, 25-Hydroxy Latest Ref Range: 30.0 - 100.0 ng/mL 44.6   Plan: Refill Vit D for 1 month, as per below. - Reiterated importance of vitamin D (as well as calcium) to their health and wellbeing.  - Reminded Statia Burdick that weight loss will likely improve availability of vitamin  D, thus encouraged her to continue with meal plan and their weight loss efforts to further improve this condition. - I recommend patient continue to take weekly prescription vit D 50,000 IU - Informed patient this may be a lifelong thing, and she was encouraged to continue to take the medicine until told otherwise.   - we will need to monitor levels  regularly (every 3-4 mo on average) to keep levels within normal limits.  - weight loss will likely improve availability of vitamin D, thus encouraged Rashad to continue with meal plan and their weight loss efforts to further improve this condition - pt's questions and concerns regarding this condition addressed.  Refill- Vitamin D, Ergocalciferol, (DRISDOL) 1.25 MG (50000 UNIT) CAPS capsule; Take 1 capsule (50,000 Units total) by mouth every 7 (seven) days.  Dispense: 4 capsule; Refill: 0  4. Class 2 severe obesity with serious comorbidity and body mass index (BMI) of 36.0 to 36.9 in adult, unspecified obesity type (HCC) Chenin is currently in the action stage of change. As such, her goal is to continue with weight loss efforts. She has agreed to the Category 2 Plan + protein equivalents.   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  Behavioral modification strategies: meal planning and cooking strategies, celebration eating strategies and planning for success.  Nali has agreed to follow-up with our clinic in 2 weeks. She was informed of the importance of frequent follow-up visits to maximize her success with intensive lifestyle modifications for her multiple health conditions.   Objective:   Blood pressure 138/85, pulse 70, temperature 98.3 F (36.8 C), height 5\' 5"  (1.651 m), weight 218 lb (98.9 kg), SpO2 97 %. Body mass index is 36.28 kg/m.  General: Cooperative, alert, well developed, in no acute distress. HEENT: Conjunctivae and lids unremarkable. Cardiovascular: Regular rhythm.  Lungs: Normal work of breathing. Neurologic: No focal deficits.   Lab Results  Component Value Date   CREATININE 1.06 (H) 04/17/2020   BUN 20 04/17/2020   NA 142 04/17/2020   K 4.2 04/17/2020   CL 104 04/17/2020   CO2 24 04/17/2020   Lab Results  Component Value Date   ALT 36 01/11/2020   AST 35  01/11/2020   ALKPHOS 53 01/11/2020   BILITOT 1.0 01/11/2020   Lab Results  Component Value Date   HGBA1C 6.9 (H) 04/17/2020   HGBA1C 9.0 (H) 01/11/2020   HGBA1C 9.8 (A) 09/23/2019   HGBA1C 9.3 (A) 03/22/2019   HGBA1C 10.8 (H) 12/04/2018   No results found for: INSULIN Lab Results  Component Value Date   TSH 0.938 02/15/2020   Lab Results  Component Value Date   CHOL 164 02/15/2020   HDL 39 (L) 02/15/2020   LDLCALC 87 02/15/2020   TRIG 227 (H) 02/15/2020   CHOLHDL 4.0 12/04/2018   Lab Results  Component Value Date   WBC 6.9 02/03/2020   HGB 13.3 02/03/2020   HCT 41.1 02/03/2020   MCV 92.2 02/03/2020   PLT 270 02/03/2020   Lab Results  Component Value Date   IRON 43 08/18/2010   TIBC 389 08/18/2010   FERRITIN 227 08/18/2010    Obesity Behavioral Intervention:   Approximately 15 minutes were spent on the discussion below.  ASK: We discussed the diagnosis of obesity with [name] today and Toshi agreed to give [place] permission to discuss obesity behavioral modification therapy today.  ASSESS: Barb has the diagnosis of  obesity and her BMI today is 36.3. Amiel is in the action stage of change.   ADVISE: Ima was educated on the multiple health risks of obesity as well as the benefit of weight loss to improve her health. She was advised of the need for long term treatment and the importance of lifestyle modifications to improve her current health and to decrease her risk of future health problems.  AGREE: Multiple dietary modification options and treatment options were discussed and Ceiara agreed to follow the recommendations documented in the above note.  ARRANGE: Nickeya was educated on the importance of frequent visits to treat obesity as outlined per CMS and USPSTF guidelines and agreed to schedule her next follow up appointment today.  Attestation Statements:   Reviewed by clinician on day of visit: allergies, medications, problem list, medical history,  surgical history, family history, social history, and previous encounter notes.  Coral Ceo, am acting as Location manager for [employer], DO.  I have reviewed the above documentation for accuracy and completeness, and I agree with the above. Marjory Sneddon, D.O.  The [place] was signed into law in 2016 which includes the topic of electronic health records.  This provides immediate access to information in MyChart.  This includes consultation notes, operative notes, office notes, lab results and pathology reports.  If you have any questions about what you read please let us know at your next visit so we can discuss your concerns and take corrective action if need be.  We are right here with you.

## 2020-06-12 ENCOUNTER — Other Ambulatory Visit: Payer: Self-pay

## 2020-06-12 ENCOUNTER — Ambulatory Visit (INDEPENDENT_AMBULATORY_CARE_PROVIDER_SITE_OTHER): Payer: Medicare HMO | Admitting: Family Medicine

## 2020-06-12 ENCOUNTER — Encounter (INDEPENDENT_AMBULATORY_CARE_PROVIDER_SITE_OTHER): Payer: Self-pay | Admitting: Family Medicine

## 2020-06-12 VITALS — BP 135/87 | HR 70 | Temp 97.5°F | Ht 65.0 in | Wt 219.0 lb

## 2020-06-12 DIAGNOSIS — E559 Vitamin D deficiency, unspecified: Secondary | ICD-10-CM | POA: Diagnosis not present

## 2020-06-12 DIAGNOSIS — Z9189 Other specified personal risk factors, not elsewhere classified: Secondary | ICD-10-CM | POA: Diagnosis not present

## 2020-06-12 DIAGNOSIS — I152 Hypertension secondary to endocrine disorders: Secondary | ICD-10-CM | POA: Diagnosis not present

## 2020-06-12 DIAGNOSIS — E1159 Type 2 diabetes mellitus with other circulatory complications: Secondary | ICD-10-CM

## 2020-06-12 DIAGNOSIS — E1169 Type 2 diabetes mellitus with other specified complication: Secondary | ICD-10-CM | POA: Diagnosis not present

## 2020-06-12 DIAGNOSIS — Z6836 Body mass index (BMI) 36.0-36.9, adult: Secondary | ICD-10-CM | POA: Diagnosis not present

## 2020-06-13 ENCOUNTER — Encounter (INDEPENDENT_AMBULATORY_CARE_PROVIDER_SITE_OTHER): Payer: Self-pay | Admitting: Family Medicine

## 2020-06-13 MED ORDER — VITAMIN D (ERGOCALCIFEROL) 1.25 MG (50000 UNIT) PO CAPS: 50000.0000 [IU] | ORAL_CAPSULE | ORAL | 0 refills | Status: DC

## 2020-06-13 MED ORDER — TRULICITY 3 MG/0.5ML ~~LOC~~ SOAJ: 3.0000 mg | SUBCUTANEOUS | 0 refills | Status: DC

## 2020-06-13 NOTE — Progress Notes (Signed)
Chief Complaint:   OBESITY Sarah [name] is here to discuss her progress with her obesity treatment plan along with follow-up of her obesity related diagnoses. Sarah [name] is on the Category 2 Plan with protein equivalents and states she is following her eating plan approximately 50% of the time. Sarah [name] states she is walking 120 minutes 3 times per week.  Today's visit was #: 9 Starting weight: 228 lbs Starting date: 02/15/2020 Today's weight: 219 lbs Today's date: 06/12/2020 Total lbs lost to date: 9 lbs Total lbs lost since last in-office visit: +1 lb Total weight loss percentage to date: -3.95%  Interim History: Sarah [name] reports that she had a nice holiday. She did overeat and did little exercise but is happy she only gained 1 pound.   Assessment/Plan:   Meds ordered this encounter  Medications  . Vitamin D, Ergocalciferol, (DRISDOL) 1.25 MG (50000 UNIT) CAPS capsule    Sig: Take 1 capsule (50,000 Units total) by mouth every 7 (seven) days.    Dispense:  4 capsule    Refill:  0  . Dulaglutide (TRULICITY) 3 0000000 SOPN    Sig: Inject 3 mg as directed once a week.    Dispense:  2 mL    Refill:  0   No orders of the defined types were placed in this encounter.   1. Type 2 diabetes mellitus with other specified complication, without long-term current use of insulin ([place]) We increased Sarah [name]'s Trulicity at her last office visit. She reports blood sugars in the 110-120's, with no lows. She has no issues with the new dose. Patient is also taking 37 units of Lantus at bedtime, Metformin, and Amaryl.  Medications reviewed. Diabetic ROS: no polyuria or polydipsia, no chest pain, dyspnea or TIA's, no numbness, tingling or pain in extremities.   Lab Results  Component Value Date   HGBA1C 6.9 (H) 04/17/2020   HGBA1C 9.0 (H) 01/11/2020   HGBA1C 9.8 (A) 09/23/2019   Lab Results  Component Value Date   MICROALBUR 80 11/13/2016   LDLCALC 87 02/15/2020   CREATININE 1.06 (H) 04/17/2020     Plan: Refill Trulicity for 1 month. Good blood sugar control is important to decrease the likelihood of diabetic complications such as nephropathy, neuropathy, limb loss, blindness, coronary artery disease, and death. Intensive lifestyle modification including diet, exercise and weight loss are the first line of treatment for diabetes.   2. Hypertension associated with type 2 diabetes mellitus ([place]) [name] reports her highest blood pressure reading at home as 167/107 and the lowest was 94/61, but average is 110's/70's. She has no symptoms or concerns. [name] is prescribed Norvasc 10 mg, Apresoline 25 mg, HCTZ 25 mg, Cozaar 50 mg, and Lopressor 25 mg.  Review: taking medications as instructed, no medication side effects noted, no chest pain on exertion, no dyspnea on exertion, no swelling of ankles.   BP Readings from Last 3 Encounters:  06/12/20 135/87  05/30/20 138/85  05/15/20 117/82    Plan: Sarah [name]'s blood pressure is at goal. Continue current treatment plan and continue home blood pressure monitoring. Sarah [name] is working on healthy weight loss and exercise to improve blood pressure control. We will watch for signs of hypotension as she continues her lifestyle modifications.  3. Vitamin D deficiency Sarah [name]'s Vitamin D level was 44.6 on 04/17/2020. She is currently taking prescription vitamin D 50,000 IU each week. She denies nausea, vomiting or muscle weakness.   Ref. Range 04/17/2020 09:28  Vitamin D, 25-Hydroxy Latest Ref Range: 30.0 -  100.0 ng/mL 44.6   Plan: Refill Vit D for 1 month. Low Vitamin D level contributes to fatigue and are associated with obesity, breast, and colon cancer. She agrees to continue to take prescription Vitamin D @50 ,000 IU every week and will follow-up for routine testing of Vitamin D, at least 2-3 times per year to avoid over-replacement.  4. At risk for impaired metabolic function Sarah [name] was given approximately 15 minutes of impaired  metabolic function  prevention counseling today. We discussed intensive lifestyle modifications today with an emphasis on specific nutrition and exercise instructions and strategies.   5. Class 2 severe obesity with serious comorbidity and body mass index (BMI) of 36.0 to 36.9 in adult, unspecified obesity type (HCC) Sarah [name] is currently in the action stage of change. As such, her goal is to continue with weight loss efforts. She has agreed to the Category 2 Plan with protein equivalents.   Exercise goals: For substantial health benefits, adults should do at least 150 minutes (2 hours and 30 minutes) a week of moderate-intensity, or 75 minutes (1 hour and 15 minutes) a week of vigorous-intensity aerobic physical activity, or an equivalent combination of moderate- and vigorous-intensity aerobic activity. Aerobic activity should be performed in episodes of at least 10 minutes, and preferably, it should be spread throughout the week.  Behavioral modification strategies: meal planning and cooking strategies and planning for success.  Sarah [name] has agreed to follow-up with our clinic in 2 weeks. She was informed of the importance of frequent follow-up visits to maximize her success with intensive lifestyle modifications for her multiple health conditions.   Objective:   Blood pressure 135/87, pulse 70, temperature (!) 97.5 F (36.4 C), height 5\' 5"  (1.651 m), weight 219 lb (99.3 kg), SpO2 96 %. Body mass index is 36.44 kg/m.  General: Cooperative, alert, well developed, in no acute distress. HEENT: Conjunctivae and lids unremarkable. Cardiovascular: Regular rhythm.  Lungs: Normal work of breathing. Neurologic: No focal deficits.   Lab Results  Component Value Date   CREATININE 1.06 (H) 04/17/2020   BUN 20 04/17/2020   NA 142 04/17/2020   K 4.2 04/17/2020   CL 104 04/17/2020   CO2 24 04/17/2020   Lab Results  Component Value Date   ALT 36 01/11/2020   AST 35 01/11/2020   ALKPHOS 53 01/11/2020   BILITOT 1.0  01/11/2020   Lab Results  Component Value Date   HGBA1C 6.9 (H) 04/17/2020   HGBA1C 9.0 (H) 01/11/2020   HGBA1C 9.8 (A) 09/23/2019   HGBA1C 9.3 (A) 03/22/2019   HGBA1C 10.8 (H) 12/04/2018   No results found for: INSULIN Lab Results  Component Value Date   TSH 0.938 02/15/2020   Lab Results  Component Value Date   CHOL 164 02/15/2020   HDL 39 (L) 02/15/2020   LDLCALC 87 02/15/2020   TRIG 227 (H) 02/15/2020   CHOLHDL 4.0 12/04/2018   Lab Results  Component Value Date   WBC 6.9 02/03/2020   HGB 13.3 02/03/2020   HCT 41.1 02/03/2020   MCV 92.2 02/03/2020   PLT 270 02/03/2020   Lab Results  Component Value Date   IRON 43 08/18/2010   TIBC 389 08/18/2010   FERRITIN 227 08/18/2010   Attestation Statements:   Reviewed by clinician on day of visit: allergies, medications, problem list, medical history, surgical history, family history, social history, and previous encounter notes.  10/18/2010, am acting as 10/18/2010 for Edmund Hilda, DO.  I have reviewed the above documentation for  accuracy and completeness, and I agree with the above. Marjory Sneddon, D.O.  The [place] was signed into law in 2016 which includes the topic of electronic health records.  This provides immediate access to information in MyChart.  This includes consultation notes, operative notes, office notes, lab results and pathology reports.  If you have any questions about what you read please let us know at your next visit so we can discuss your concerns and take corrective action if need be.  We are right here with you.

## 2020-06-14 ENCOUNTER — Other Ambulatory Visit (INDEPENDENT_AMBULATORY_CARE_PROVIDER_SITE_OTHER): Payer: Self-pay

## 2020-06-14 ENCOUNTER — Encounter: Payer: Self-pay | Admitting: Internal Medicine

## 2020-06-14 ENCOUNTER — Ambulatory Visit (INDEPENDENT_AMBULATORY_CARE_PROVIDER_SITE_OTHER): Payer: Medicare HMO | Admitting: Endocrinology

## 2020-06-14 ENCOUNTER — Encounter: Payer: Self-pay | Admitting: Endocrinology

## 2020-06-14 ENCOUNTER — Other Ambulatory Visit: Payer: Self-pay

## 2020-06-14 ENCOUNTER — Other Ambulatory Visit (INDEPENDENT_AMBULATORY_CARE_PROVIDER_SITE_OTHER): Payer: Self-pay | Admitting: Family Medicine

## 2020-06-14 VITALS — BP 120/78 | HR 70 | Ht 65.0 in | Wt 224.0 lb

## 2020-06-14 DIAGNOSIS — E1169 Type 2 diabetes mellitus with other specified complication: Secondary | ICD-10-CM | POA: Diagnosis not present

## 2020-06-14 DIAGNOSIS — E559 Vitamin D deficiency, unspecified: Secondary | ICD-10-CM

## 2020-06-14 DIAGNOSIS — E669 Obesity, unspecified: Secondary | ICD-10-CM

## 2020-06-14 LAB — POCT GLYCOSYLATED HEMOGLOBIN (HGB A1C): Hemoglobin A1C: 5.6 % (ref 4.0–5.6)

## 2020-06-14 MED ORDER — TRULICITY 3 MG/0.5ML ~~LOC~~ SOAJ: 3.0000 mg | SUBCUTANEOUS | 0 refills | Status: DC

## 2020-06-14 MED ORDER — VITAMIN D (ERGOCALCIFEROL) 1.25 MG (50000 UNIT) PO CAPS: 50000.0000 [IU] | ORAL_CAPSULE | ORAL | 0 refills | Status: DC

## 2020-06-14 NOTE — Patient Instructions (Addendum)
Please stop taking the HCTZ.  Please come back for a follow-up appointment in 1 month, when we'll plan to do some blood tests.  Please follow up the A1c with Dr Laural Benes.  If you develop swelling of the ankles in the meantime, please call so we can start a different type of fluid pill.

## 2020-06-14 NOTE — Progress Notes (Signed)
Subjective:    Patient ID: Sarah [name], female    DOB: Nov 20, 1965, 55 y.o.   MRN: [id number]  HPI Pt is referred by Dr Wynetta Emery, for hypercalcemia.  Pt was noted to have hypercalcemia in 2020.  she has never had osteoporosis, urolithiasis, parathyroid probs, sarcoidosis, PUD, pancreatitis, or bony fracture.  she does not take vitamin-A supplement.  Pt denies taking antacids and Li++.   she has been on high dose vit-D, x 6 mos.  Main symptom is back pain.  Past Medical History:  Diagnosis Date  . Arthritis   . Diabetes mellitus without complication ([place])   . Family history of adverse reaction to anesthesia    sister-n/v  . Family history of colon cancer   . GERD (gastroesophageal reflux disease)   . Glaucoma 02/16  . Glucagonoma 07/28/14   Pt denies this but states she has glaucoma  . History of chemotherapy   . History of hiatal hernia   . Hypertension   . Imbalance   . Neuropathy    feet bilat  . Nocturia   . Peritoneal carcinomatosis ([place])    carcinoma of ovary   . Shortness of breath dyspnea    hx of 2013 - no problems currently   . Sleep apnea   . Thyroid nodule    history of   . Wears glasses     Past Surgical History:  Procedure Laterality Date  . ABDOMINAL HYSTERECTOMY    . FOOT SURGERY  04/2018   [facility]  . INCISIONAL HERNIA REPAIR N/A 07/06/2015   Procedure: INCISIONAL HERNIA REPAIR ;  Surgeon: Rolm Bookbinder, MD;  Location: [facility];  Service: General;  Laterality: N/A;  . INSERTION OF MESH N/A 07/06/2015   Procedure: WITH INSERTION OF PHASIX ST MESH;  Surgeon: Rolm Bookbinder, MD;  Location: [facility];  Service: General;  Laterality: N/A;  . LAPAROTOMY N/A 07/06/2015   Procedure: EXPLORATORY LAPAROTOMY;  Surgeon: Everitt Amber, MD;  Location: [facility];  Service: Gynecology;  Laterality: N/A;  . LYSIS OF ADHESION N/A 07/06/2015   Procedure:  LYSIS OF ADHESION RESECTION OF MESENTERIC MASS BOWEL RESECTION ;  Surgeon: Everitt Amber, MD;  Location: [facility];  Service:  Gynecology;  Laterality: N/A;  . ROBOTIC ASSISTED TOTAL HYSTERECTOMY WITH BILATERAL SALPINGO OOPHERECTOMY Bilateral 07/28/2014   Procedure: ROBOTIC ASSISTED lysis of adhesions with biopsies, converted to LAPAROTOMY, bilateral salpingoorphorectomy, omentectomy,appendectomy;  Surgeon: Janie Morning, MD;  Location: [facility];  Service: Gynecology;  Laterality: Bilateral;  . THYROIDECTOMY, PARTIAL    . VIDEO BRONCHOSCOPY N/A 01/13/2020   Procedure: VIDEO BRONCHOSCOPY WITH BIOPSIES;  Surgeon: Melrose Nakayama, MD;  Location: [facility];  Service: Thoracic;  Laterality: N/A;    Social History   Socioeconomic History  . Marital status: Married    Spouse name: Not on file  . Number of children: 2  . Years of education: Not on file  . Highest education level: High school graduate  Occupational History  . Occupation: disability  Tobacco Use  . Smoking status: Never Smoker  . Smokeless tobacco: Never Used  Vaping Use  . Vaping Use: Never used  Substance and Sexual Activity  . Alcohol use: No  . Drug use: No  . Sexual activity: Not on file  Other Topics Concern  . Not on file  Social History Narrative   No issues with transportation.    Attends church   Social Determinants of Radio broadcast assistant Strain: Not on file  Food Insecurity: Not on  file  Transportation Needs: Not on file  Physical Activity: Not on file  Stress: Not on file  Social Connections: Not on file  Intimate Partner Violence: Not on file    Current Outpatient Medications on File Prior to Visit  Medication Sig Dispense Refill  . albuterol (VENTOLIN HFA) 108 (90 Base) MCG/ACT inhaler Inhale 2 puffs into the lungs every 6 (six) hours as needed for wheezing or shortness of breath. 8 g 0  . amLODipine (NORVASC) 10 MG tablet Take 1 tablet (10 mg total) by mouth daily. 90 tablet 3  . Blood Glucose Monitoring Suppl (ACCU-CHEK AVIVA PLUS) w/Device KIT USE AS DIRECTED 1 kit 0  . gabapentin (NEURONTIN) 600 MG tablet Take 1  tablet (600 mg total) by mouth 3 (three) times daily. 270 tablet 3  . glimepiride (AMARYL) 4 MG tablet Take 1 tablet (4 mg total) by mouth daily with breakfast. 90 tablet 3  . insulin glargine (LANTUS SOLOSTAR) 100 UNIT/ML Solostar Pen Inject 37 units daily subcut 45 mL 1  . Insulin Pen Needle 29G X 12MM MISC Use as directed. 100 each 6  . Lancets (ACCU-CHEK MULTICLIX) lancets   0  . Lancets (ACCU-CHEK SOFT TOUCH) lancets Use as instructed 100 each 12  . Lancets Misc. (ACCU-CHEK SOFTCLIX LANCET DEV) KIT Use as directed 1 kit 0  . losartan (COZAAR) 50 MG tablet Take 1 tablet (50 mg total) by mouth daily. 90 tablet 1  . meloxicam (MOBIC) 15 MG tablet TAKE 1 TABLET EVERY DAY 90 tablet 1  . metFORMIN (GLUCOPHAGE) 1000 MG tablet TAKE 1 TABLET TWICE DAILY WITH MEALS 180 tablet 1  . metoprolol tartrate (LOPRESSOR) 25 MG tablet TAKE 1/2 TABLET TWICE DAILY 90 tablet 1  . rosuvastatin (CRESTOR) 20 MG tablet TAKE 1 TABLET EVERY DAY. STOP ATORVASTATIN 90 tablet 3  . timolol (BETIMOL) 0.5 % ophthalmic solution Place 1 drop into both eyes daily.    . Travoprost, BAK Free, (TRAVATAN) 0.004 % SOLN ophthalmic solution Place 1 drop into both eyes at bedtime.    . vitamin B-12 (CYANOCOBALAMIN) 500 MCG tablet Take 1 tablet (500 mcg total) by mouth daily.    . [DISCONTINUED] blood glucose meter kit and supplies Dispense based on patient and insurance preference. Use up to four times daily as directed. (FOR ICD-9 250.00, 250.01). 1 each 0   No current facility-administered medications on file prior to visit.    Allergies  Allergen Reactions  . Emend [Aprepitant] Other (See Comments)    Urticaria   . Lisinopril Cough    Family History  Problem Relation Age of Onset  . Hypertension Mother   . Hyperlipidemia Mother   . Stroke Mother   . Thyroid disease Mother   . Hypertension Father   . Diabetes Father   . Hyperlipidemia Father   . Sudden death Father   . Cancer Sister 50       fibrosarcoma (back);  currently 61  . Prostate cancer Maternal Uncle   . Colon cancer Paternal Aunt        Dx 38s; deceased 67s  . Prostate cancer Paternal Uncle        currently 80  . Cancer Paternal Uncle 47       "bone" ; unk. primary  . Stomach cancer Paternal Uncle   . Hypercalcemia Neg Hx     BP 120/78   Pulse 70   Ht 5' 5" (1.651 m)   Wt 224 lb (101.6 kg)   LMP  (LMP Unknown)  SpO2 99%   BMI 37.28 kg/m    Review of Systems Denies depression.     Objective:   Physical Exam VS: see vs page GEN: no distress HEAD: head: no deformity eyes: no periorbital swelling, no proptosis external nose and ears are normal NECK: a healed scar is present.  I do not appreciate a nodule in the thyroid or elsewhere in the neck CHEST WALL: no kyphosis. LUNGS: clear to auscultation CV: reg rate and rhythm, no murmur.  MUSCULOSKELETAL: gait is normal and steady EXTEMITIES: no deformity.  no leg edema NEURO:  readily moves all 4's.  sensation is intact to touch on all 4's SKIN:  Normal texture and temperature.  No rash or suspicious lesion is visible.   NODES:  None palpable at the neck PSYCH: alert, well-oriented.  Does not appear anxious nor depressed.  Lab Results  Component Value Date   PTH 26 04/29/2019   PTH Comment 04/29/2019   CALCIUM 11.9 (H) 04/17/2020   Lab Results  Component Value Date   ALT 36 01/11/2020   AST 35 01/11/2020   ALKPHOS 53 01/11/2020   BILITOT 1.0 01/11/2020   Lab Results  Component Value Date   CREATININE 1.06 (H) 04/17/2020   BUN 20 04/17/2020   NA 142 04/17/2020   K 4.2 04/17/2020   CL 104 04/17/2020   CO2 24 04/17/2020   25-OH vit-D=45  T-spine and L-spine x-rays: normal.    I have reviewed outside records, and summarized:  Pt was noted to have elevated Ca++, and referred here.  Obesity and DM were also addressed.        Assessment & Plan:  Hypercalcemia: Poss due to HCTZ.  Light chair disease.  HTN: well-controlled.   Patient Instructions   Please stop taking the HCTZ.  Please come back for a follow-up appointment in 1 month, when we'll plan to do some blood tests.  Please follow up the A1c with Dr Wynetta Emery.  If you develop swelling of the ankles in the meantime, please call so we can start a different type of fluid pill.

## 2020-06-15 ENCOUNTER — Other Ambulatory Visit: Payer: Self-pay | Admitting: Internal Medicine

## 2020-06-15 NOTE — Telephone Encounter (Signed)
Future visit in 2 months  

## 2020-06-17 ENCOUNTER — Other Ambulatory Visit: Payer: Self-pay | Admitting: Internal Medicine

## 2020-06-17 DIAGNOSIS — I10 Essential (primary) hypertension: Secondary | ICD-10-CM

## 2020-06-17 NOTE — Telephone Encounter (Signed)
Requested Prescriptions  Pending Prescriptions Disp Refills  . hydrALAZINE (APRESOLINE) 25 MG tablet [Pharmacy Med Name: HYDRALAZINE HYDROCHLORIDE 25 MG Tablet] 405 tablet 3    Sig: TAKE 1 AND 1/2 TABLETS THREE TIMES DAILY     Cardiovascular:  Vasodilators Passed - 06/17/2020  7:05 AM      Passed - HCT in normal range and within 360 days    HCT  Date Value Ref Range Status  02/03/2020 41.1 36.0 - 46.0 % Final  04/22/2016 39.0 34.8 - 46.6 % Final   Hematocrit  Date Value Ref Range Status  12/04/2018 41.0 34.0 - 46.6 % Final         Passed - HGB in normal range and within 360 days    Hemoglobin  Date Value Ref Range Status  02/03/2020 13.3 12.0 - 15.0 g/dL Final  12/04/2018 13.6 11.1 - 15.9 g/dL Final   HGB  Date Value Ref Range Status  04/22/2016 12.9 11.6 - 15.9 g/dL Final         Passed - RBC in normal range and within 360 days    RBC  Date Value Ref Range Status  02/03/2020 4.46 3.87 - 5.11 MIL/uL Final         Passed - WBC in normal range and within 360 days    WBC  Date Value Ref Range Status  01/11/2020 6.8 4.0 - 10.5 K/uL Final   WBC Count  Date Value Ref Range Status  02/03/2020 6.9 4.0 - 10.5 K/uL Final         Passed - PLT in normal range and within 360 days    Platelets  Date Value Ref Range Status  12/04/2018 332 150 - 450 x10E3/uL Final   Platelet Count  Date Value Ref Range Status  02/03/2020 270 150 - 400 K/uL Final         Passed - Last BP in normal range    BP Readings from Last 1 Encounters:  06/14/20 120/78         Passed - Valid encounter within last 12 months    Recent Outpatient Visits          1 month ago Need for shingles vaccine   [place], Jarome Matin, RPH-CPP   1 month ago Type 2 diabetes mellitus with obesity [facility])   [place], Deborah B, MD   4 months ago Need for shingles vaccine   [place],  Jarome Matin, RPH-CPP   4 months ago Encounter for [employer] annual wellness exam   [place], Deborah B, MD   4 months ago Encounter for medication review   [place], RPH-CPP      Future Appointments            In 2 months Wynetta Emery, Dalbert Batman, MD [place]

## 2020-06-19 ENCOUNTER — Other Ambulatory Visit (INDEPENDENT_AMBULATORY_CARE_PROVIDER_SITE_OTHER): Payer: Self-pay | Admitting: Family Medicine

## 2020-06-19 DIAGNOSIS — E1169 Type 2 diabetes mellitus with other specified complication: Secondary | ICD-10-CM

## 2020-06-21 ENCOUNTER — Other Ambulatory Visit: Payer: Self-pay | Admitting: Internal Medicine

## 2020-06-21 DIAGNOSIS — E669 Obesity, unspecified: Secondary | ICD-10-CM

## 2020-06-21 DIAGNOSIS — E1169 Type 2 diabetes mellitus with other specified complication: Secondary | ICD-10-CM

## 2020-06-26 ENCOUNTER — Telehealth (INDEPENDENT_AMBULATORY_CARE_PROVIDER_SITE_OTHER): Payer: Medicare HMO | Admitting: Family Medicine

## 2020-06-26 ENCOUNTER — Telehealth (INDEPENDENT_AMBULATORY_CARE_PROVIDER_SITE_OTHER): Payer: Self-pay

## 2020-06-26 ENCOUNTER — Other Ambulatory Visit: Payer: Self-pay

## 2020-06-26 ENCOUNTER — Encounter (INDEPENDENT_AMBULATORY_CARE_PROVIDER_SITE_OTHER): Payer: Self-pay | Admitting: Family Medicine

## 2020-06-26 DIAGNOSIS — E1169 Type 2 diabetes mellitus with other specified complication: Secondary | ICD-10-CM | POA: Diagnosis not present

## 2020-06-26 DIAGNOSIS — Z6836 Body mass index (BMI) 36.0-36.9, adult: Secondary | ICD-10-CM

## 2020-06-26 DIAGNOSIS — E1159 Type 2 diabetes mellitus with other circulatory complications: Secondary | ICD-10-CM | POA: Diagnosis not present

## 2020-06-26 DIAGNOSIS — I152 Hypertension secondary to endocrine disorders: Secondary | ICD-10-CM | POA: Diagnosis not present

## 2020-06-26 DIAGNOSIS — E785 Hyperlipidemia, unspecified: Secondary | ICD-10-CM

## 2020-06-26 DIAGNOSIS — Z9189 Other specified personal risk factors, not elsewhere classified: Secondary | ICD-10-CM

## 2020-06-26 NOTE — Telephone Encounter (Signed)
Verbal consent obtained to conduct video visit, via telehealth 

## 2020-06-27 ENCOUNTER — Other Ambulatory Visit: Payer: Medicare HMO

## 2020-06-27 NOTE — Progress Notes (Addendum)
TeleHealth Visit:  Due to the COVID-19 pandemic, this visit was completed with telemedicine (audio/video) technology to reduce patient and provider exposure as well as to preserve personal protective equipment.   [name] has verbally consented to this TeleHealth visit. The patient is located at home, the provider is located at the [facility] office. The participants in this visit include the listed provider and patient. The visit was conducted today via telephone today as pt was unable to stay connected to the video visit, although this was attempted multiple times. Approximately 30 min was spent on this office visit and the care of this pt.  Chief Complaint: OBESITY Sarah [name] is here to discuss her progress with her obesity treatment plan along with follow-up of her obesity related diagnoses. [name] is on the Category 2 Plan with protein equivalents and states she is following her eating plan approximately 80% of the time. [name] states she is using [employer] 15 minutes 3 times per week.  Today's visit was #: 10 Starting weight: 228 lbs Starting date: 02/15/2020  Interim History: [name]'s last OV was 06/12/2020. She gained 1 lb only over the holidays and had been following the plan 50% of the time. She has been struggling with this bad weather. Reports she did a lot more snacking yesterday.   Assessment/Plan:   1. Hypertension associated with type 2 diabetes mellitus ([place]) [name]'s A1c on 04/17/2020 was 6.9, which is down form 9.0 previously. Her blood pressures are averaging 110-120/70-80 at home. [name] denies complaints. She is prescribed hydralazine, Norvasc, losartan, and metoprolol. HCTZ was recently d/c by Endo.  BP Readings from Last 3 Encounters:  06/14/20 120/78  06/12/20 135/87  05/30/20 138/85   Plan: Continue current treatment plan, per PCP and specialists. BP is at goal. Continue weight loss and prudent nutritional plan.  2. Type 2 diabetes mellitus with  other specified complication, without long-term current use of insulin (HCC) PCP took patient off glimepiride due to recent A1c of 5.6, but Endo or PCP did not lower Lantus. [name]'s blood sugars run 90-100. She is prescribed Trulicity (per [place]), Lantus 37 UI (per Endo), and Metformin (per Endo).  Plan: Decrease Lantus from 37 units to 30 units. Consider increasing GLP-1 in the future or adding Januvia and weaning off insulin.  3. Hyperlipidemia associated with type 2 diabetes mellitus ([place]) [name] is prescribed Crestor. She denies issues with medication. Her last lipid panel was in September.   Lab Results  Component Value Date   ALT 36 01/11/2020   AST 35 01/11/2020   ALKPHOS 53 01/11/2020   BILITOT 1.0 01/11/2020   Lab Results  Component Value Date   CHOL 164 02/15/2020   HDL 39 (L) 02/15/2020   LDLCALC 87 02/15/2020   TRIG 227 (H) 02/15/2020   CHOLHDL 4.0 12/04/2018   Plan: Recheck fasting lipid panel at next OV. Hopefully we'll see improvement in triglycerides and LDL. Continue current treatment plan.  Lab/Orders today or future: - Lipid panel  4. At risk for hypoglycemia [name] was given approximately 15 minutes of counseling today regarding prevention of hypoglycemia. She was advised of symptoms of hypoglycemia. [name] was instructed to avoid skipping meals, eat regular protein rich meals and schedule low calorie snacks as needed.   5. Class 2 severe obesity with serious comorbidity and body mass index (BMI) of 36.0 to 36.9 in adult, unspecified obesity type (HCC) [name] is currently in the action stage of change. As such, her goal is to continue with  weight loss efforts. She has agreed to the Category 2 Plan with protein equivalents.   Exercise goals: As is  Behavioral modification strategies: meal planning and cooking strategies, avoiding temptations and planning for success.  [name] has agreed to follow-up with our clinic in 2 weeks on 07/10/2020 at 0900 (obtain FLP).  She was informed of the importance of frequent follow-up visits to maximize her success with intensive lifestyle modifications for her multiple health conditions.  Objective:   VITALS: Per patient if applicable, see vitals. GENERAL: Alert and in no acute distress. CARDIOPULMONARY: No increased WOB. Speaking in clear sentences.  PSYCH: Pleasant and cooperative. Speech normal rate and rhythm. Affect is appropriate. Insight and judgement are appropriate. Attention is focused, linear, and appropriate.  NEURO: Oriented as arrived to appointment on time with no prompting.   Lab Results  Component Value Date   CREATININE 1.06 (H) 04/17/2020   BUN 20 04/17/2020   NA 142 04/17/2020   K 4.2 04/17/2020   CL 104 04/17/2020   CO2 24 04/17/2020   Lab Results  Component Value Date   ALT 36 01/11/2020   AST 35 01/11/2020   ALKPHOS 53 01/11/2020   BILITOT 1.0 01/11/2020   Lab Results  Component Value Date   HGBA1C 5.6 06/14/2020   HGBA1C 6.9 (H) 04/17/2020   HGBA1C 9.0 (H) 01/11/2020   HGBA1C 9.8 (A) 09/23/2019   HGBA1C 9.3 (A) 03/22/2019   No results found for: INSULIN Lab Results  Component Value Date   TSH 0.938 02/15/2020   Lab Results  Component Value Date   CHOL 164 02/15/2020   HDL 39 (L) 02/15/2020   LDLCALC 87 02/15/2020   TRIG 227 (H) 02/15/2020   CHOLHDL 4.0 12/04/2018   Lab Results  Component Value Date   WBC 6.9 02/03/2020   HGB 13.3 02/03/2020   HCT 41.1 02/03/2020   MCV 92.2 02/03/2020   PLT 270 02/03/2020   Lab Results  Component Value Date   IRON 43 08/18/2010   TIBC 389 08/18/2010   FERRITIN 227 08/18/2010    Attestation Statements:   Reviewed by clinician on day of visit: allergies, medications, problem list, medical history, surgical history, family history, social history, and previous encounter notes.  Coral Ceo, am acting as Location manager for [employer], DO.  I have reviewed the above documentation for accuracy and  completeness, and I agree with the above. Marjory Sneddon, D.O.  The [place] was signed into law in 2016 which includes the topic of electronic health records.  This provides immediate access to information in MyChart.  This includes consultation notes, operative notes, office notes, lab results and pathology reports.  If you have any questions about what you read please let us know at your next visit so we can discuss your concerns and take corrective action if need be.  We are right here with you.

## 2020-07-03 ENCOUNTER — Ambulatory Visit (INDEPENDENT_AMBULATORY_CARE_PROVIDER_SITE_OTHER): Payer: Medicare HMO | Admitting: Family Medicine

## 2020-07-04 ENCOUNTER — Ambulatory Visit: Payer: Medicare HMO | Admitting: Thoracic Surgery (Cardiothoracic Vascular Surgery)

## 2020-07-10 ENCOUNTER — Ambulatory Visit (INDEPENDENT_AMBULATORY_CARE_PROVIDER_SITE_OTHER): Payer: Medicare HMO | Admitting: Family Medicine

## 2020-07-10 ENCOUNTER — Other Ambulatory Visit: Payer: Self-pay

## 2020-07-10 ENCOUNTER — Encounter (INDEPENDENT_AMBULATORY_CARE_PROVIDER_SITE_OTHER): Payer: Self-pay | Admitting: Family Medicine

## 2020-07-10 VITALS — BP 139/85 | HR 94 | Temp 98.0°F | Ht 65.0 in | Wt 218.0 lb

## 2020-07-10 DIAGNOSIS — Z6836 Body mass index (BMI) 36.0-36.9, adult: Secondary | ICD-10-CM | POA: Diagnosis not present

## 2020-07-10 DIAGNOSIS — E559 Vitamin D deficiency, unspecified: Secondary | ICD-10-CM | POA: Diagnosis not present

## 2020-07-10 DIAGNOSIS — E1169 Type 2 diabetes mellitus with other specified complication: Secondary | ICD-10-CM

## 2020-07-10 DIAGNOSIS — E785 Hyperlipidemia, unspecified: Secondary | ICD-10-CM | POA: Diagnosis not present

## 2020-07-10 MED ORDER — TRULICITY 3 MG/0.5ML ~~LOC~~ SOAJ: 3.0000 mg | SUBCUTANEOUS | 0 refills | Status: DC

## 2020-07-10 MED ORDER — VITAMIN D (ERGOCALCIFEROL) 1.25 MG (50000 UNIT) PO CAPS: 50000.0000 [IU] | ORAL_CAPSULE | ORAL | 0 refills | Status: DC

## 2020-07-11 ENCOUNTER — Other Ambulatory Visit (INDEPENDENT_AMBULATORY_CARE_PROVIDER_SITE_OTHER): Payer: Self-pay | Admitting: Family Medicine

## 2020-07-11 ENCOUNTER — Ambulatory Visit
Admission: RE | Admit: 2020-07-11 | Discharge: 2020-07-11 | Disposition: A | Discharge status: Home / Self Care | Payer: Medicare HMO | Source: Ambulatory Visit | Attending: Thoracic Surgery (Cardiothoracic Vascular Surgery) | Admitting: Thoracic Surgery (Cardiothoracic Vascular Surgery)

## 2020-07-11 DIAGNOSIS — R911 Solitary pulmonary nodule: Secondary | ICD-10-CM

## 2020-07-11 DIAGNOSIS — E890 Postprocedural hypothyroidism: Secondary | ICD-10-CM | POA: Diagnosis not present

## 2020-07-11 DIAGNOSIS — M47814 Spondylosis without myelopathy or radiculopathy, thoracic region: Secondary | ICD-10-CM | POA: Diagnosis not present

## 2020-07-11 DIAGNOSIS — E1169 Type 2 diabetes mellitus with other specified complication: Secondary | ICD-10-CM

## 2020-07-11 DIAGNOSIS — J9811 Atelectasis: Secondary | ICD-10-CM | POA: Diagnosis not present

## 2020-07-11 DIAGNOSIS — I2510 Atherosclerotic heart disease of native coronary artery without angina pectoris: Secondary | ICD-10-CM | POA: Diagnosis not present

## 2020-07-11 LAB — LIPID PANEL
Chol/HDL Ratio: 3.7 ratio (ref 0.0–4.4)
Cholesterol, Total: 161 mg/dL (ref 100–199)
HDL: 44 mg/dL (ref >39)
LDL Chol Calc (NIH): 87 mg/dL (ref 0–99)
Triglycerides: 175 mg/dL — ABNORMAL HIGH (ref 0–149)
VLDL Cholesterol Cal: 30 mg/dL (ref 5–40)

## 2020-07-11 NOTE — Progress Notes (Signed)
Chief Complaint:   OBESITY Sarah [name] is here to discuss her progress with her obesity treatment plan along with follow-up of her obesity related diagnoses. Sarah [name] is on the Category 2 Plan with protein equivalents and states she is following her eating plan approximately 60% of the time. Sarah [name] states she is using [employer] 15 minutes 2 times per week.  Today's visit was #: 11 Starting weight: 228 lbs Starting date: 02/15/2020 Today's weight: 218 lbs Today's date: 07/10/2020 Total lbs lost to date: 10 lbs Total lbs lost since last in-office visit: 1 lb Total weight loss percentage to date: -4.39%  Interim History: Sarah [name] reports still struggling to stay on plan and eat all foods, especially lunches.   Plan: No skipping meals. Handout given on tips for meal planning.   Assessment/Plan:   No orders of the defined types were placed in this encounter.   Medications Discontinued During This Encounter  Medication Reason  . glimepiride (AMARYL) 4 MG tablet   . Vitamin D, Ergocalciferol, (DRISDOL) 1.25 MG (50000 UNIT) CAPS capsule Reorder  . Dulaglutide (TRULICITY) 3 ZJ/6.9CV SOPN Reorder     Meds ordered this encounter  Medications  . Vitamin D, Ergocalciferol, (DRISDOL) 1.25 MG (50000 UNIT) CAPS capsule    Sig: Take 1 capsule (50,000 Units total) by mouth every 7 (seven) days.    Dispense:  4 capsule    Refill:  0  . Dulaglutide (TRULICITY) 3 EL/3.8BO SOPN    Sig: Inject 3 mg as directed once a week.    Dispense:  2 mL    Refill:  0       1. Type 2 diabetes mellitus with other specified complication, without long-term current use of insulin (HCC) Sarah [name] has decreased Lantus from 37 units to 30 units per our recommendations last OV. This has helped to decrease hunger.  Lab Results  Component Value Date   HGBA1C 5.6 06/14/2020   HGBA1C 6.9 (H) 04/17/2020   HGBA1C 9.0 (H) 01/11/2020   Lab Results  Component Value Date   MICROALBUR 80 11/13/2016   LDLCALC 87  07/10/2020   CREATININE 1.06 (H) 04/17/2020   No results found for: INSULIN  Plan: Refill Trulicity for 1 month, as per below. No change to Trulicity dosing due to hunger being well controlled and fasting blood sugars all being less than 110 and stable. Continue meds per endocrinology with goal of weaning insulin as much as tolerated.  Refill- Dulaglutide (TRULICITY) 3 FB/5.1WC SOPN; Inject 3 mg as directed once a week.  Dispense: 2 mL; Refill: 0  2. Vitamin D deficiency Sarah [name]'s Vitamin D level was 44.6 on 04/17/2020. She is currently taking prescription vitamin D 50,000 IU each week. She denies nausea, vomiting or muscle weakness.   Ref. Range 04/17/2020 09:28  Vitamin D, 25-Hydroxy Latest Ref Range: 30.0 - 100.0 ng/mL 44.6   Plan: Refill Vit D for 1 month, as per below. Low Vitamin D level contributes to fatigue and are associated with obesity, breast, and colon cancer. She agrees to continue to take prescription Vitamin D @50 ,000 IU every week and will follow-up for routine testing of Vitamin D, at least 2-3 times per year to avoid over-replacement.  Refill- Vitamin D, Ergocalciferol, (DRISDOL) 1.25 MG (50000 UNIT) CAPS capsule; Take 1 capsule (50,000 Units total) by mouth every 7 (seven) days.  Dispense: 4 capsule; Refill: 0  3. Hyperlipidemia associated with type 2 diabetes mellitus ([place]) Pt is on Crestor at bedtime and tolerating it well without side  effects.    Ref. Range 02/15/2020 11:04  Cholesterol, Total Latest Ref Range: 100 - 199 mg/dL 164  HDL Cholesterol Latest Ref Range: >39 mg/dL 39 (L)  LDL/HDL Ratio Latest Ref Range: 0.0 - 3.2 ratio 2.2  Triglycerides Latest Ref Range: 0 - 149 mg/dL 227 (H)  VLDL Cholesterol Cal Latest Ref Range: 5 - 40 mg/dL 38  LDL Chol Calc (NIH) Latest Ref Range: 0 - 99 mg/dL 87   Lab Results  Component Value Date   ALT 36 01/11/2020   AST 35 01/11/2020   ALKPHOS 53 01/11/2020   BILITOT 1.0 01/11/2020    Plan: Recheck FLP today. Continue  Crestor at current dose. Dose adjustment prn.  4. Class 2 severe obesity with serious comorbidity and body mass index (BMI) of 36.0 to 36.9 in adult, unspecified obesity type (HCC) Sarah [name] is currently in the action stage of change. As such, her goal is to continue with weight loss efforts. She has agreed to the Category 2 Plan with protein equivalents.   Exercise goals: Increase exercise to 15 minutes 5 days a week.  Behavioral modification strategies: increasing lean protein intake, no skipping meals, meal planning and cooking strategies, keeping healthy foods in the home and planning for success.  Sarah [name] has agreed to follow-up with our clinic in 2-3 weeks. She was informed of the importance of frequent follow-up visits to maximize her success with intensive lifestyle modifications for her multiple health conditions.   Sarah [name] was informed we would discuss her lab results at her next visit unless there is a critical issue that needs to be addressed sooner. Sarah [name] agreed to keep her next visit at the agreed upon time to discuss these results.  Objective:   Blood pressure 139/85, pulse 94, temperature 98 F (36.7 C), height 5\' 5"  (1.651 m), weight 218 lb (98.9 kg), SpO2 96 %. Body mass index is 36.28 kg/m.  General: Cooperative, alert, well developed, in no acute distress. HEENT: Conjunctivae and lids unremarkable. Cardiovascular: Regular rhythm.  Lungs: Normal work of breathing. Neurologic: No focal deficits.   Lab Results  Component Value Date   CREATININE 1.06 (H) 04/17/2020   BUN 20 04/17/2020   NA 142 04/17/2020   K 4.2 04/17/2020   CL 104 04/17/2020   CO2 24 04/17/2020   Lab Results  Component Value Date   ALT 36 01/11/2020   AST 35 01/11/2020   ALKPHOS 53 01/11/2020   BILITOT 1.0 01/11/2020   Lab Results  Component Value Date   HGBA1C 5.6 06/14/2020   HGBA1C 6.9 (H) 04/17/2020   HGBA1C 9.0 (H) 01/11/2020   HGBA1C 9.8 (A) 09/23/2019   HGBA1C 9.3 (A) 03/22/2019    No results found for: INSULIN Lab Results  Component Value Date   TSH 0.938 02/15/2020   Lab Results  Component Value Date   CHOL 161 07/10/2020   HDL 44 07/10/2020   LDLCALC 87 07/10/2020   TRIG 175 (H) 07/10/2020   CHOLHDL 3.7 07/10/2020   Lab Results  Component Value Date   WBC 6.9 02/03/2020   HGB 13.3 02/03/2020   HCT 41.1 02/03/2020   MCV 92.2 02/03/2020   PLT 270 02/03/2020   Lab Results  Component Value Date   IRON 43 08/18/2010   TIBC 389 08/18/2010   FERRITIN 227 08/18/2010    Obesity Behavioral Intervention:   Approximately 15 minutes were spent on the discussion below.  ASK: We discussed the diagnosis of obesity with Sarah [name] today and Sarah [name] agreed to give [place]  permission to discuss obesity behavioral modification therapy today.  ASSESS: Sarah [name] has the diagnosis of obesity and her BMI today is 36.3. Sarah [name] is in the action stage of change.   ADVISE: Sarah [name] was educated on the multiple health risks of obesity as well as the benefit of weight loss to improve her health. She was advised of the need for long term treatment and the importance of lifestyle modifications to improve her current health and to decrease her risk of future health problems.  AGREE: Multiple dietary modification options and treatment options were discussed and Sarah [name] agreed to follow the recommendations documented in the above note.  ARRANGE: Sarah [name] was educated on the importance of frequent visits to treat obesity as outlined per CMS and USPSTF guidelines and agreed to schedule her next follow up appointment today.  Attestation Statements:   Reviewed by clinician on day of visit: allergies, medications, problem list, medical history, surgical history, family history, social history, and previous encounter notes.  Coral Ceo, am acting as Location manager for [employer], DO.  I have reviewed the above documentation for accuracy and completeness, and I agree with the  above. Marjory Sneddon, D.O.  The [place] was signed into law in 2016 which includes the topic of electronic health records.  This provides immediate access to information in MyChart.  This includes consultation notes, operative notes, office notes, lab results and pathology reports.  If you have any questions about what you read please let us know at your next visit so we can discuss your concerns and take corrective action if need be.  We are right here with you.

## 2020-07-18 ENCOUNTER — Encounter: Payer: Self-pay | Admitting: Thoracic Surgery (Cardiothoracic Vascular Surgery)

## 2020-07-18 ENCOUNTER — Other Ambulatory Visit: Payer: Self-pay

## 2020-07-18 ENCOUNTER — Ambulatory Visit: Payer: Medicare HMO | Admitting: Thoracic Surgery (Cardiothoracic Vascular Surgery)

## 2020-07-18 VITALS — BP 150/90 | HR 80 | Temp 97.7°F | Resp 20 | Ht 65.0 in | Wt 225.0 lb

## 2020-07-18 DIAGNOSIS — R911 Solitary pulmonary nodule: Secondary | ICD-10-CM | POA: Diagnosis not present

## 2020-07-18 NOTE — Progress Notes (Signed)
[address] FallsSuite [address]       [place] [address]             [phone]     HPI: [name] returns for follow-up of her right lower lobe lung nodule.  Sarah [name] is a 55 year old woman with history of ovarian cancer, hypertension, hyperlipidemia, type II non-insulin-dependent diabetes, diabetic neuropathy, thyroid nodule, gout, and a right lower lobe lung nodule.  She is a lifelong non-smoker.  She was treated for a stage IIIc low-grade serous ovarian cancer in 2016.  She has been followed since then.  Last summer she had a CT of the chest which showed an 11 mm smoothly marginated right lower lobe lung nodule.  In retrospect that nodule has been present going back to 2016 and was about 5 mm at that time.  A PET/CT showed no significant metabolic activity.  PET dotatate also showed no uptake.  I did a bronchoscopy and biopsy on 01/13/2020.  There was smooth endobronchial lesion but biopsies only showed benign bronchial mucosa.  Recommended resection but because of Covid surgery were delaying any nonurgent surgeries.  She now returns to further discuss possible surgical resection.  She continues to have occasional cough and wheezing.  This has not changed in any significant way over the past 6 months.  Past Medical History:  Diagnosis Date  . Arthritis   . Diabetes mellitus without complication ([place])   . Family history of adverse reaction to anesthesia    sister-n/v  . Family history of colon cancer   . GERD (gastroesophageal reflux disease)   . Glaucoma 02/16  . Glucagonoma 07/28/14   Pt denies this but states she has glaucoma  . History of chemotherapy   . History of hiatal hernia   . Hypertension   . Imbalance   . Neuropathy    feet bilat  . Nocturia   . Peritoneal carcinomatosis ([place])    carcinoma of ovary   . Shortness of breath dyspnea    hx of 2013 - no problems currently   . Sleep apnea   . Thyroid nodule    history of   . Wears glasses     Current  Outpatient Medications  Medication Sig Dispense Refill  . albuterol (VENTOLIN HFA) 108 (90 Base) MCG/ACT inhaler Inhale 2 puffs into the lungs every 6 (six) hours as needed for wheezing or shortness of breath. 8 g 0  . amLODipine (NORVASC) 10 MG tablet Take 1 tablet (10 mg total) by mouth daily. 90 tablet 3  . Blood Glucose Monitoring Suppl (ACCU-CHEK AVIVA PLUS) w/Device KIT USE AS DIRECTED 1 kit 0  . Dulaglutide (TRULICITY) 3 ZS/8.2LM SOPN Inject 3 mg as directed once a week. 2 mL 0  . gabapentin (NEURONTIN) 600 MG tablet Take 1 tablet (600 mg total) by mouth 3 (three) times daily. 270 tablet 3  . glucose blood (ACCU-CHEK AVIVA PLUS) test strip TEST BLOOD SUGAR AS DIRECTED 300 strip 0  . hydrALAZINE (APRESOLINE) 25 MG tablet TAKE 1 AND 1/2 TABLETS THREE TIMES DAILY 405 tablet 3  . insulin glargine (LANTUS SOLOSTAR) 100 UNIT/ML Solostar Pen INJECT 30 UNITS DAILY. INCREASE BY 2 UNITS EVERY 3 DAYS UNTIL MORNING SUGAR IS LESS THAN 130. MAX OF 40 UNITS. 45 mL 1  . Insulin Pen Needle 29G X 12MM MISC Use as directed. 100 each 6  . Lancets (ACCU-CHEK MULTICLIX) lancets   0  . Lancets (ACCU-CHEK SOFT TOUCH) lancets Use as instructed 100  each 12  . Lancets Misc. (ACCU-CHEK SOFTCLIX LANCET DEV) KIT Use as directed 1 kit 0  . losartan (COZAAR) 50 MG tablet Take 1 tablet (50 mg total) by mouth daily. 90 tablet 1  . meloxicam (MOBIC) 15 MG tablet TAKE 1 TABLET EVERY DAY 90 tablet 1  . metFORMIN (GLUCOPHAGE) 1000 MG tablet TAKE 1 TABLET TWICE DAILY WITH MEALS 180 tablet 1  . metoprolol tartrate (LOPRESSOR) 25 MG tablet TAKE 1/2 TABLET TWICE DAILY 90 tablet 1  . rosuvastatin (CRESTOR) 20 MG tablet TAKE 1 TABLET EVERY DAY. STOP ATORVASTATIN 90 tablet 3  . timolol (BETIMOL) 0.5 % ophthalmic solution Place 1 drop into both eyes daily.    . Travoprost, BAK Free, (TRAVATAN) 0.004 % SOLN ophthalmic solution Place 1 drop into both eyes at bedtime.    . vitamin B-12 (CYANOCOBALAMIN) 500 MCG tablet Take 1 tablet (500  mcg total) by mouth daily.    . Vitamin D, Ergocalciferol, (DRISDOL) 1.25 MG (50000 UNIT) CAPS capsule Take 1 capsule (50,000 Units total) by mouth every 7 (seven) days. 4 capsule 0   No current facility-administered medications for this visit.    Physical Exam BP (!) 150/90   Pulse 80   Temp 97.7 F (36.5 C) (Skin)   Resp 20   Ht $R'5\' 5"'zS$  (1.651 m)   Wt 225 lb (102.1 kg)   LMP  (LMP Unknown)   SpO2 94% Comment: RA  BMI 37.44 kg/m  Obese 55 year old woman in no acute distress Alert and oriented x3 with no focal deficits No cervical or supraclavicular adenopathy Lungs clear with equal breath sounds bilaterally, no rales or wheezing Cardiac regular rate and rhythm normal S1 and S2 Abdomen ventral hernia No peripheral edema  Diagnostic Tests: CT CHEST WITHOUT CONTRAST  TECHNIQUE: Multidetector CT imaging of the chest was performed following the standard protocol without IV contrast.  COMPARISON:  Dotatate PET-CT dated 12/08/2019 and FDG PET-CT dated 10/26/2019  FINDINGS: Cardiovascular: The heart is normal in size. No pericardial effusion.  No evidence of thoracic aortic aneurysm. Mild atherosclerotic calcifications of the aortic arch.  Mild coronary atherosclerosis the LAD and right coronary artery.  Mediastinum/Nodes: No suspicious mediastinal lymphadenopathy.  Status post left thyroidectomy. Enlarged right thyroid with multiple nodules, previously characterized on thyroid ultrasound. This has been evaluated on previous imaging. (ref: J Am Coll Radiol. 2015 Feb;12(2): 143-50).  Lungs/Pleura: 11 mm nodule in the anterior right lower lobe (series 8/image 80), previously 10 mm. This may have been present in 2016, measuring 5 mm, although that is equivocal. This was present in 2018, measuring 9 mm. This did not demonstrate hypermetabolism on prior DOTATATE PET and non avid on prior FDG PET.  Mild scarring/atelectasis in the left lower lobe. Mild  ground-glass opacity/mosaic attenuation in the bilateral lower lobes. No focal consolidation.  No pleural effusion or pneumothorax.  Upper Abdomen: Visualized upper abdomen is notable for a large ventral hernia and vascular calcifications.  Musculoskeletal: Mild degenerative changes of the mid thoracic spine.  IMPRESSION: 11 mm right lower lobe nodule is mildly progressive, with slow growth from prior studies, without hypermetabolism on prior Dotatate and FDG PET studies. Indolent neoplasm such as pulmonary carcinoid is favored.  Aortic Atherosclerosis (ICD10-I70.0).   Electronically Signed   By: Julian Hy M.D.   On: 07/11/2020 12:42 I personally reviewed the CT images and concur with the findings of a slow-growing, 11 mm, smoothly marginated right lower lobe lung nodule.  Impression: Sarah [name] is a 55 year old woman history of  ovarian cancer, hypertension, hyperlipidemia, type II non-insulin-dependent diabetes, diabetic neuropathy, thyroid nodule, gout, and a right lower lobe lung nodule.   She has been followed for her ovarian cancer.  A right lower lobe lung nodule turned up on CT scans.  This nodule has very slowly grown over time.  There was no activity on conventional path or PET dotatate.  I did a bronchoscopy and could see a lesion.  Unfortunately, biopsies only showed benign bronchial mucosa.  I suspect this is either a low-grade carcinoid or bronchial adenoma that was pushing on the bronchial mucosa.  Hamartoma is also in the differential although the imaging characteristics are not classic for that.  In any event I do think this should be removed as it is likely to cause bronchial obstruction and increased risk for pneumonia if not removed.  I discussed these issues with Sarah [name].  She understands there is no urgency to the procedure but I do think she would benefit from it.    I described the proposed procedure which would be a robotic right VATS  for wedge resection or segmentectomy depending on intraoperative findings.  I informed her of the general nature of the procedure including the need for general anesthesia, the incisions to be used, the use of a drainage tube postoperatively, the expected hospital stay, and the overall recovery.  I informed her of the indications, risks, benefits, and alternatives.  She understands the risks including, but not limited to death, MI, DVT, PE, bleeding, possible need for transfusion, infection, prolonged air leak, cardiac arrhythmias, as well as possibility of other unforeseeable risks.  She accepts the risks and agrees to proceed.  She wishes to wait until near the end of March to have the procedure done.  Given the limited resection required and the fact that she is a non-smoker, she does not need pulmonary function testing.  Plan: Robotic right VATS for wedge resection or segmentectomy. Patient will call to schedule  Melrose Nakayama, MD [facility] [phone]

## 2020-07-20 ENCOUNTER — Other Ambulatory Visit: Payer: Self-pay

## 2020-07-24 ENCOUNTER — Telehealth (INDEPENDENT_AMBULATORY_CARE_PROVIDER_SITE_OTHER): Payer: Medicare HMO | Admitting: Family Medicine

## 2020-07-24 ENCOUNTER — Telehealth (INDEPENDENT_AMBULATORY_CARE_PROVIDER_SITE_OTHER): Payer: Medicare HMO | Admitting: Endocrinology

## 2020-07-24 ENCOUNTER — Other Ambulatory Visit: Payer: Self-pay

## 2020-07-24 ENCOUNTER — Encounter: Payer: Self-pay | Admitting: Endocrinology

## 2020-07-24 ENCOUNTER — Encounter (INDEPENDENT_AMBULATORY_CARE_PROVIDER_SITE_OTHER): Payer: Self-pay | Admitting: Family Medicine

## 2020-07-24 VITALS — BP 128/83

## 2020-07-24 DIAGNOSIS — Z6836 Body mass index (BMI) 36.0-36.9, adult: Secondary | ICD-10-CM | POA: Diagnosis not present

## 2020-07-24 DIAGNOSIS — Z794 Long term (current) use of insulin: Secondary | ICD-10-CM

## 2020-07-24 DIAGNOSIS — E1169 Type 2 diabetes mellitus with other specified complication: Secondary | ICD-10-CM | POA: Diagnosis not present

## 2020-07-24 NOTE — Patient Instructions (Addendum)
Blood tests are requested for you today.  We'll let you know about the results.  If these tests are normal, you should avoid the HCTZ, and have the blood calcium check at lease once a year, by Dr Wynetta Emery. If the calcium is still high, the next step is to check a 24HR urine collection for calcium.

## 2020-07-24 NOTE — Progress Notes (Signed)
 Subjective:    Patient ID: Sarah [name], female    DOB: 09/08/1965, 54 y.o.   MRN: [id number]  HPI telehealth visit today via video visit.  Alternatives to telehealth are presented to this patient, and the patient agrees to the telehealth visit.  Pt is advised of the cost of the visit, and agrees to this, also.   Patient is at home, and I am at home.   Persons attending the telehealth visit: the patient and I.   She says BP is 118/79.  pt states she feels well in general.  She still takes Vit-D.   Pt returns for f/u of hypercalcemia (dx'ed 2020; she has never had osteoporosis, urolithiasis, or bony fracture; she has been on high dose vit-D, since 2021). Past Medical History:  Diagnosis Date  . Arthritis   . Diabetes mellitus without complication (HCC)   . Family history of adverse reaction to anesthesia    sister-n/v  . Family history of colon cancer   . GERD (gastroesophageal reflux disease)   . Glaucoma 02/16  . Glucagonoma 07/28/14   Pt denies this but states she has glaucoma  . History of chemotherapy   . History of hiatal hernia   . Hypertension   . Imbalance   . Neuropathy    feet bilat  . Nocturia   . Peritoneal carcinomatosis (HCC)    carcinoma of ovary   . Shortness of breath dyspnea    hx of 2013 - no problems currently   . Sleep apnea   . Thyroid nodule    history of   . Wears glasses     Past Surgical History:  Procedure Laterality Date  . ABDOMINAL HYSTERECTOMY    . FOOT SURGERY  04/2018   [facility]  . INCISIONAL HERNIA REPAIR N/A 07/06/2015   Procedure: INCISIONAL HERNIA REPAIR ;  Surgeon: Matthew Wakefield, MD;  Location: [facility];  Service: General;  Laterality: N/A;  . INSERTION OF MESH N/A 07/06/2015   Procedure: WITH INSERTION OF PHASIX ST MESH;  Surgeon: Matthew Wakefield, MD;  Location: [facility];  Service: General;  Laterality: N/A;  . LAPAROTOMY N/A 07/06/2015   Procedure: EXPLORATORY LAPAROTOMY;  Surgeon: Emma Rossi, MD;  Location: [facility];   Service: Gynecology;  Laterality: N/A;  . LYSIS OF ADHESION N/A 07/06/2015   Procedure:  LYSIS OF ADHESION RESECTION OF MESENTERIC MASS BOWEL RESECTION ;  Surgeon: Emma Rossi, MD;  Location: [facility];  Service: Gynecology;  Laterality: N/A;  . ROBOTIC ASSISTED TOTAL HYSTERECTOMY WITH BILATERAL SALPINGO OOPHERECTOMY Bilateral 07/28/2014   Procedure: ROBOTIC ASSISTED lysis of adhesions with biopsies, converted to LAPAROTOMY, bilateral salpingoorphorectomy, omentectomy,appendectomy;  Surgeon: Wendy Brewster, MD;  Location: [facility];  Service: Gynecology;  Laterality: Bilateral;  . THYROIDECTOMY, PARTIAL    . VIDEO BRONCHOSCOPY N/A 01/13/2020   Procedure: VIDEO BRONCHOSCOPY WITH BIOPSIES;  Surgeon: Hendrickson, Steven C, MD;  Location: [facility];  Service: Thoracic;  Laterality: N/A;    Social History   Socioeconomic History  . Marital status: Married    Spouse name: Not on file  . Number of children: 2  . Years of education: Not on file  . Highest education level: High school graduate  Occupational History  . Occupation: disability  Tobacco Use  . Smoking status: Never Smoker  . Smokeless tobacco: Never Used  Vaping Use  . Vaping Use: Never used  Substance and Sexual Activity  . Alcohol use: No  . Drug use: No  . Sexual activity: Not on file    Other Topics Concern  . Not on file  Social History Narrative   No issues with transportation.    Attends church   Social Determinants of Radio broadcast assistant Strain: Not on file  Food Insecurity: Not on file  Transportation Needs: Not on file  Physical Activity: Not on file  Stress: Not on file  Social Connections: Not on file  Intimate Partner Violence: Not on file    Current Outpatient Medications on File Prior to Visit  Medication Sig Dispense Refill  . albuterol (VENTOLIN HFA) 108 (90 Base) MCG/ACT inhaler Inhale 2 puffs into the lungs every 6 (six) hours as needed for wheezing or shortness of breath. 8 g 0  . amLODipine (NORVASC)  10 MG tablet Take 1 tablet (10 mg total) by mouth daily. 90 tablet 3  . Blood Glucose Monitoring Suppl (ACCU-CHEK AVIVA PLUS) w/Device KIT USE AS DIRECTED 1 kit 0  . Dulaglutide (TRULICITY) 3 ZJ/6.7HA SOPN Inject 3 mg as directed once a week. 2 mL 0  . gabapentin (NEURONTIN) 600 MG tablet Take 1 tablet (600 mg total) by mouth 3 (three) times daily. 270 tablet 3  . glucose blood (ACCU-CHEK AVIVA PLUS) test strip TEST BLOOD SUGAR AS DIRECTED 300 strip 0  . hydrALAZINE (APRESOLINE) 25 MG tablet TAKE 1 AND 1/2 TABLETS THREE TIMES DAILY 405 tablet 3  . insulin glargine (LANTUS SOLOSTAR) 100 UNIT/ML Solostar Pen INJECT 30 UNITS DAILY. INCREASE BY 2 UNITS EVERY 3 DAYS UNTIL MORNING SUGAR IS LESS THAN 130. MAX OF 40 UNITS. 45 mL 1  . Insulin Pen Needle 29G X 12MM MISC Use as directed. 100 each 6  . Lancets (ACCU-CHEK MULTICLIX) lancets   0  . Lancets (ACCU-CHEK SOFT TOUCH) lancets Use as instructed 100 each 12  . Lancets Misc. (ACCU-CHEK SOFTCLIX LANCET DEV) KIT Use as directed 1 kit 0  . losartan (COZAAR) 50 MG tablet Take 1 tablet (50 mg total) by mouth daily. 90 tablet 1  . meloxicam (MOBIC) 15 MG tablet TAKE 1 TABLET EVERY DAY 90 tablet 1  . metFORMIN (GLUCOPHAGE) 1000 MG tablet TAKE 1 TABLET TWICE DAILY WITH MEALS 180 tablet 1  . metoprolol tartrate (LOPRESSOR) 25 MG tablet TAKE 1/2 TABLET TWICE DAILY 90 tablet 1  . rosuvastatin (CRESTOR) 20 MG tablet TAKE 1 TABLET EVERY DAY. STOP ATORVASTATIN 90 tablet 3  . timolol (BETIMOL) 0.5 % ophthalmic solution Place 1 drop into both eyes daily.    . Travoprost, BAK Free, (TRAVATAN) 0.004 % SOLN ophthalmic solution Place 1 drop into both eyes at bedtime.    . vitamin B-12 (CYANOCOBALAMIN) 500 MCG tablet Take 1 tablet (500 mcg total) by mouth daily.    . Vitamin D, Ergocalciferol, (DRISDOL) 1.25 MG (50000 UNIT) CAPS capsule Take 1 capsule (50,000 Units total) by mouth every 7 (seven) days. 4 capsule 0   No current facility-administered medications on file  prior to visit.    Allergies  Allergen Reactions  . Emend [Aprepitant] Other (See Comments)    Urticaria   . Lisinopril Cough    Family History  Problem Relation Age of Onset  . Hypertension Mother   . Hyperlipidemia Mother   . Stroke Mother   . Thyroid disease Mother   . Hypertension Father   . Diabetes Father   . Hyperlipidemia Father   . Sudden death Father   . Cancer Sister 50       fibrosarcoma (back); currently 4  . Prostate cancer Maternal Uncle   . Colon  cancer Paternal Aunt        Dx 29s; deceased 47s  . Prostate cancer Paternal Uncle        currently 28  . Cancer Paternal Uncle 56       "bone" ; unk. primary  . Stomach cancer Paternal Uncle   . Hypercalcemia Neg Hx     Ht 5' 5" (1.651 m)   Wt 218 lb (98.9 kg)   LMP  (LMP Unknown)   BMI 36.28 kg/m     Review of Systems Denies leg edema    Objective:   Physical Exam       Assessment & Plan:  Hypercalcemia, uncertain etiology and prognosis.    Patient Instructions  Blood tests are requested for you today.  We'll let you know about the results.  If these tests are normal, you should avoid the HCTZ, and have the blood calcium check at lease once a year, by Dr Wynetta Emery. If the calcium is still high, the next step is to check a 24HR urine collection for calcium.

## 2020-07-26 ENCOUNTER — Other Ambulatory Visit: Payer: Self-pay

## 2020-07-26 ENCOUNTER — Other Ambulatory Visit (INDEPENDENT_AMBULATORY_CARE_PROVIDER_SITE_OTHER): Payer: Medicare HMO

## 2020-07-26 LAB — VITAMIN D 25 HYDROXY (VIT D DEFICIENCY, FRACTURES): VITD: 41.54 ng/mL (ref 30.00–100.00)

## 2020-07-27 LAB — PTH, INTACT AND CALCIUM
Calcium: 11.7 mg/dL — ABNORMAL HIGH (ref 8.6–10.4)
PTH: 26 pg/mL (ref 14–64)

## 2020-07-31 NOTE — Progress Notes (Signed)
TeleHealth Visit:  Due to the COVID-19 pandemic, this visit was completed with telemedicine (audio/video) technology to reduce patient and provider exposure as well as to preserve personal protective equipment.   Sarah [name] has verbally consented to this TeleHealth visit. The patient is located at home, the provider is located at the [facility] office. The participants in this visit include the listed provider and patient and. The visit was conducted today via [place].  OBESITY [name] is here to discuss her progress with her obesity treatment plan along with follow-up of her obesity related diagnoses.   Today's visit was #: 12 Starting weight: 228 lbs Starting date: 02/15/2020 Today's date: 07/24/2020  Interim History: [name] was recently seen by Cardiothoracic Surgery and Pulmonology.  Nodule needs to be resected.  She is having increased stress and on some days she has skipped meals.  She denies cravings or increased emotional eating.  Nutrition Plan: Category 2 Plan with protein equivalents for 80% of the time. Anti-obesity medications: Trulicity. Reported side effects: None. Activity: YouTube videos for 20-40 minutes 2-3 days per week. Stress: Elevated. This patient is following the prescribed meal plan meal without concerns.  Food recall appears to be consistent with the prescribed plan.  When following the plan, hunger and cravings are well controlled.    Assessment/Plan:   1. Type 2 diabetes mellitus with other specified complication, with long-term current use of insulin (HCC) Diabetes Mellitus: Controlled. Medication: Trulicity 3 mg subcutaneously weekly, Lantus, and metformin 1,000 mg twice daily.  FBS lows 90.  Highest 110.  A1c 5.6 in 06/2020.  Issues reviewed: blood sugar goals, complications of diabetes mellitus, hypoglycemia prevention and treatment, exercise, and nutrition.   Plan: Well controlled.  No signs of complications, medication side effects, or red  flags.  Continue current regimen.   The patient was encouraged to monitor blood sugars regularly and to bring their log to the next appointment for review.  The importance of regular follow up with PCP and all other specialists as scheduled was stressed to patient today.  Lab Results  Component Value Date   HGBA1C 5.6 06/14/2020   HGBA1C 6.9 (H) 04/17/2020   HGBA1C 9.0 (H) 01/11/2020   Lab Results  Component Value Date   MICROALBUR 80 11/13/2016   LDLCALC 87 07/10/2020   CREATININE 1.06 (H) 04/17/2020   2. Class 2 severe obesity with serious comorbidity and body mass index (BMI) of 36.0 to 36.9 in adult, unspecified obesity type [facility])  Course: [name] is currently in the action stage of change. As such, her goal is to continue with weight loss efforts.   Nutrition goals: She has agreed to the Category 2 Plan with protein equivalents.   Exercise goals: As is but increase to 3-5 days per week.  Behavioral modification strategies: keeping healthy foods in the home and emotional eating strategies.  [name] has agreed to follow-up with our clinic in 2 weeks. She was informed of the importance of frequent follow-up visits to maximize her success with intensive lifestyle modifications for her multiple health conditions.   Objective:   Blood pressure 128/83.  General: Cooperative, alert, well developed, in no acute distress. HEENT: Conjunctivae and lids unremarkable. Cardiovascular: Regular rhythm.  Lungs: Normal work of breathing. Neurologic: No focal deficits.   Lab Results  Component Value Date   CREATININE 1.06 (H) 04/17/2020   BUN 20 04/17/2020   NA 142 04/17/2020   K 4.2 04/17/2020   CL 104 04/17/2020   CO2 24  04/17/2020   Lab Results  Component Value Date   ALT 36 01/11/2020   AST 35 01/11/2020   ALKPHOS 53 01/11/2020   BILITOT 1.0 01/11/2020   Lab Results  Component Value Date   HGBA1C 5.6 06/14/2020   HGBA1C 6.9 (H) 04/17/2020   HGBA1C 9.0 (H) 01/11/2020    HGBA1C 9.8 (A) 09/23/2019   HGBA1C 9.3 (A) 03/22/2019   Lab Results  Component Value Date   TSH 0.938 02/15/2020   Lab Results  Component Value Date   CHOL 161 07/10/2020   HDL 44 07/10/2020   LDLCALC 87 07/10/2020   TRIG 175 (H) 07/10/2020   CHOLHDL 3.7 07/10/2020   Lab Results  Component Value Date   WBC 6.9 02/03/2020   HGB 13.3 02/03/2020   HCT 41.1 02/03/2020   MCV 92.2 02/03/2020   PLT 270 02/03/2020   Lab Results  Component Value Date   IRON 43 08/18/2010   TIBC 389 08/18/2010   FERRITIN 227 08/18/2010   Attestation Statements:   Reviewed by clinician on day of visit: allergies, medications, problem list, medical history, surgical history, family history, social history, and previous encounter notes.  I, Water quality scientist, CMA, am acting as Location manager for [employer], DO.  I have reviewed the above documentation for accuracy and completeness, and I agree with the above. Marjory Sneddon, D.O.  The [place] was signed into law in 2016 which includes the topic of electronic health records.  This provides immediate access to information in MyChart.  This includes consultation notes, operative notes, office notes, lab results and pathology reports.  If you have any questions about what you read please let us know at your next visit so we can discuss your concerns and take corrective action if need be.  We are right here with you.

## 2020-08-01 ENCOUNTER — Other Ambulatory Visit (INDEPENDENT_AMBULATORY_CARE_PROVIDER_SITE_OTHER): Payer: Self-pay | Admitting: Family Medicine

## 2020-08-01 DIAGNOSIS — E1169 Type 2 diabetes mellitus with other specified complication: Secondary | ICD-10-CM

## 2020-08-03 LAB — VITAMIN D 1,25 DIHYDROXY
Vitamin D 1, 25 (OH)2 Total: 24 pg/mL (ref 18–72)
Vitamin D2 1, 25 (OH)2: 24 pg/mL
Vitamin D3 1, 25 (OH)2: <8 pg/mL

## 2020-08-03 LAB — PTH-RELATED PEPTIDE: PTH-Related Protein (PTH-RP): 17 pg/mL (ref 11–20)

## 2020-08-03 LAB — VITAMIN A: Vitamin A (Retinoic Acid): 73 ug/dL (ref 38–98)

## 2020-08-04 ENCOUNTER — Other Ambulatory Visit: Payer: Self-pay | Admitting: Endocrinology

## 2020-08-09 ENCOUNTER — Other Ambulatory Visit: Payer: Self-pay

## 2020-08-09 ENCOUNTER — Encounter (INDEPENDENT_AMBULATORY_CARE_PROVIDER_SITE_OTHER): Payer: Self-pay | Admitting: Family Medicine

## 2020-08-09 ENCOUNTER — Ambulatory Visit (INDEPENDENT_AMBULATORY_CARE_PROVIDER_SITE_OTHER): Payer: Medicare HMO | Admitting: Family Medicine

## 2020-08-09 VITALS — BP 130/90 | HR 75 | Temp 98.5°F | Ht 65.0 in | Wt 216.0 lb

## 2020-08-09 DIAGNOSIS — G4709 Other insomnia: Secondary | ICD-10-CM

## 2020-08-09 DIAGNOSIS — Z9189 Other specified personal risk factors, not elsewhere classified: Secondary | ICD-10-CM

## 2020-08-09 DIAGNOSIS — E1169 Type 2 diabetes mellitus with other specified complication: Secondary | ICD-10-CM

## 2020-08-09 DIAGNOSIS — E559 Vitamin D deficiency, unspecified: Secondary | ICD-10-CM

## 2020-08-09 DIAGNOSIS — Z6836 Body mass index (BMI) 36.0-36.9, adult: Secondary | ICD-10-CM

## 2020-08-09 MED ORDER — VITAMIN D (ERGOCALCIFEROL) 1.25 MG (50000 UNIT) PO CAPS: 50000.0000 [IU] | ORAL_CAPSULE | ORAL | 0 refills | Status: DC

## 2020-08-09 MED ORDER — TRULICITY 3 MG/0.5ML ~~LOC~~ SOAJ: 3.0000 mg | SUBCUTANEOUS | 0 refills | Status: DC

## 2020-08-10 NOTE — Progress Notes (Signed)
Chief Complaint:   OBESITY Sarah [name] is here to discuss her progress with her obesity treatment plan along with follow-up of her obesity related diagnoses.   Today's visit was #: 13 Starting weight: 228 lbs Starting date: 02/15/2020 Today's weight: 216 lbs Today's date: 08/09/2020 Total lbs lost to date: 12 lbs Body mass index is 35.94 kg/m.  Total weight loss percentage to date: -5.26%  Interim History:  Dilia says that Trulicity has helped her decrease her snacking/cravings.  She felt more full and satisfied.  Denies side effects.  Blood sugars are stable.  Denies issues with the plan.  Current Meal Plan: the Category 2 Plan for 70% of the time.  Current Exercise Plan: YouTube videos for 15 minutes 3 times per week. Current Anti-Obesity Medications: Trulicity 3 mg subcutaneously weekly. Side effects: None.  Assessment/Plan:   1. Type 2 diabetes mellitus with other specified complication, without long-term current use of insulin (HCC) Diabetes Mellitus: Controlled. Medication: Trulicity 3 mg subcutaneously weekly. Issues reviewed: blood sugar goals, complications of diabetes mellitus, hypoglycemia prevention and treatment, exercise, and nutrition.  Since she has been on Trulicity, highest FBS 90 and lowest 70.  No symptoms or concerns.  Plan: The importance of regular follow up with PCP and all other specialists as scheduled was stressed to patient today.  Will refill Trulicity today, as per below.  Lab Results  Component Value Date   HGBA1C 5.6 06/14/2020   HGBA1C 6.9 (H) 04/17/2020   HGBA1C 9.0 (H) 01/11/2020   Lab Results  Component Value Date   MICROALBUR 80 11/13/2016   LDLCALC 87 07/10/2020   CREATININE 1.06 (H) 04/17/2020   - Refill Dulaglutide (TRULICITY) 3 IH/4.7QQ SOPN; Inject 3 mg as directed once a week.  Dispense: 2 mL; Refill: 0  2. Other insomnia This is moderately controlled. Current treatment: Melatonin.  She has been seen by her PCP in the past for  this.  Plan: Recommend sleep hygiene measures including regular sleep schedule, optimal sleep environment, and relaxing presleep rituals.   Increase melatonin to maximum dose of 10 mg at bedtime.    3. Vitamin D deficiency Not at goal. Current vitamin D is 24.0, tested on 07/26/2020. Optimal goal > 50 ng/dL.  She is taking vitamin D 50,000 IU daily.  Plan: Continue to take prescription Vitamin D @50 ,000 IU every week as prescribed.  Follow-up for routine testing of Vitamin D, at least 2-3 times per year to avoid over-replacement.  - Refill Vitamin D, Ergocalciferol, (DRISDOL) 1.25 MG (50000 UNIT) CAPS capsule; Take 1 capsule (50,000 Units total) by mouth every 7 (seven) days.  Dispense: 4 capsule; Refill: 0  4. Class 2 severe obesity with serious comorbidity and body mass index (BMI) of 36.0 to 36.9 in adult, unspecified obesity type [facility])  Course: Cathlene is currently in the action stage of change. As such, her goal is to continue with weight loss efforts.   Nutrition goals: She has agreed to the Category 2 Plan.   Exercise goals: Increase to 5+ days per week.  Behavioral modification strategies: increasing lean protein intake, meal planning and cooking strategies, keeping healthy foods in the home and avoiding temptations.  Shakeia has agreed to follow-up with our clinic in 2 weeks. She was informed of the importance of frequent follow-up visits to maximize her success with intensive lifestyle modifications for her multiple health conditions.   Objective:   Blood pressure 130/90, pulse 75, temperature 98.5 F (36.9 C), height 5\' 5"  (1.651 m), weight  216 lb (98 kg), SpO2 98 %. Body mass index is 35.94 kg/m.  General: Cooperative, alert, well developed, in no acute distress. HEENT: Conjunctivae and lids unremarkable. Cardiovascular: Regular rhythm.  Lungs: Normal work of breathing. Neurologic: No focal deficits.   Lab Results  Component Value Date   CREATININE 1.06 (H) 04/17/2020    BUN 20 04/17/2020   NA 142 04/17/2020   K 4.2 04/17/2020   CL 104 04/17/2020   CO2 24 04/17/2020   Lab Results  Component Value Date   ALT 36 01/11/2020   AST 35 01/11/2020   ALKPHOS 53 01/11/2020   BILITOT 1.0 01/11/2020   Lab Results  Component Value Date   HGBA1C 5.6 06/14/2020   HGBA1C 6.9 (H) 04/17/2020   HGBA1C 9.0 (H) 01/11/2020   HGBA1C 9.8 (A) 09/23/2019   HGBA1C 9.3 (A) 03/22/2019   Lab Results  Component Value Date   TSH 0.938 02/15/2020   Lab Results  Component Value Date   CHOL 161 07/10/2020   HDL 44 07/10/2020   LDLCALC 87 07/10/2020   TRIG 175 (H) 07/10/2020   CHOLHDL 3.7 07/10/2020   Lab Results  Component Value Date   WBC 6.9 02/03/2020   HGB 13.3 02/03/2020   HCT 41.1 02/03/2020   MCV 92.2 02/03/2020   PLT 270 02/03/2020   Lab Results  Component Value Date   IRON 43 08/18/2010   TIBC 389 08/18/2010   FERRITIN 227 08/18/2010   Obesity Behavioral Intervention:   Approximately 15 minutes were spent on the discussion below.  ASK: We discussed the diagnosis of obesity with [name] today and Lakelynn agreed to give [place] permission to discuss obesity behavioral modification therapy today.  ASSESS: Hannia has the diagnosis of obesity and her BMI today is 36.0. Ariane is in the action stage of change.   ADVISE: Sameera was educated on the multiple health risks of obesity as well as the benefit of weight loss to improve her health. She was advised of the need for long term treatment and the importance of lifestyle modifications to improve her current health and to decrease her risk of future health problems.  AGREE: Multiple dietary modification options and treatment options were discussed and Lorrayne agreed to follow the recommendations documented in the above note.  ARRANGE: Azoria was educated on the importance of frequent visits to treat obesity as outlined per CMS and USPSTF guidelines and agreed to schedule her next follow up appointment  today.  Attestation Statements:   Reviewed by clinician on day of visit: allergies, medications, problem list, medical history, surgical history, family history, social history, and previous encounter notes.  I, Water quality scientist, CMA, am acting as Location manager for [employer], DO.  I have reviewed the above documentation for accuracy and completeness, and I agree with the above. Marjory Sneddon, D.O.  The [place] was signed into law in 2016 which includes the topic of electronic health records.  This provides immediate access to information in MyChart.  This includes consultation notes, operative notes, office notes, lab results and pathology reports.  If you have any questions about what you read please let us know at your next visit so we can discuss your concerns and take corrective action if need be.  We are right here with you.

## 2020-08-11 ENCOUNTER — Other Ambulatory Visit: Payer: Medicare HMO

## 2020-08-11 ENCOUNTER — Other Ambulatory Visit: Payer: Self-pay

## 2020-08-14 ENCOUNTER — Encounter (INDEPENDENT_AMBULATORY_CARE_PROVIDER_SITE_OTHER): Payer: Self-pay | Admitting: Family Medicine

## 2020-08-14 ENCOUNTER — Encounter: Payer: Self-pay | Admitting: Internal Medicine

## 2020-08-14 ENCOUNTER — Other Ambulatory Visit (INDEPENDENT_AMBULATORY_CARE_PROVIDER_SITE_OTHER): Payer: Self-pay | Admitting: Family Medicine

## 2020-08-14 DIAGNOSIS — E559 Vitamin D deficiency, unspecified: Secondary | ICD-10-CM

## 2020-08-14 NOTE — Telephone Encounter (Signed)
Would you be able to send it in

## 2020-08-15 ENCOUNTER — Other Ambulatory Visit (INDEPENDENT_AMBULATORY_CARE_PROVIDER_SITE_OTHER): Payer: Self-pay

## 2020-08-15 DIAGNOSIS — E1169 Type 2 diabetes mellitus with other specified complication: Secondary | ICD-10-CM

## 2020-08-15 DIAGNOSIS — E559 Vitamin D deficiency, unspecified: Secondary | ICD-10-CM

## 2020-08-15 MED ORDER — VITAMIN D (ERGOCALCIFEROL) 1.25 MG (50000 UNIT) PO CAPS: 50000.0000 [IU] | ORAL_CAPSULE | ORAL | 0 refills | Status: DC

## 2020-08-15 MED ORDER — TRULICITY 3 MG/0.5ML ~~LOC~~ SOAJ: 3.0000 mg | SUBCUTANEOUS | 0 refills | Status: DC

## 2020-08-15 NOTE — Telephone Encounter (Signed)
Last OV with Dr Opalski 

## 2020-08-16 ENCOUNTER — Other Ambulatory Visit: Payer: Self-pay

## 2020-08-16 ENCOUNTER — Other Ambulatory Visit (INDEPENDENT_AMBULATORY_CARE_PROVIDER_SITE_OTHER): Payer: Medicare HMO

## 2020-08-17 LAB — CALCIUM, URINE, 24 HOUR: Calcium, 24H Urine: 81 mg/24 h

## 2020-08-18 ENCOUNTER — Other Ambulatory Visit: Payer: Self-pay | Admitting: Internal Medicine

## 2020-08-21 ENCOUNTER — Encounter (INDEPENDENT_AMBULATORY_CARE_PROVIDER_SITE_OTHER): Payer: Self-pay | Admitting: Family Medicine

## 2020-08-21 ENCOUNTER — Ambulatory Visit (INDEPENDENT_AMBULATORY_CARE_PROVIDER_SITE_OTHER): Payer: Medicare HMO | Admitting: Family Medicine

## 2020-08-21 ENCOUNTER — Other Ambulatory Visit: Payer: Self-pay

## 2020-08-21 VITALS — BP 124/78 | HR 74 | Temp 97.9°F | Ht 65.0 in | Wt 216.0 lb

## 2020-08-21 DIAGNOSIS — Z794 Long term (current) use of insulin: Secondary | ICD-10-CM | POA: Diagnosis not present

## 2020-08-21 DIAGNOSIS — Z6836 Body mass index (BMI) 36.0-36.9, adult: Secondary | ICD-10-CM

## 2020-08-21 DIAGNOSIS — E1169 Type 2 diabetes mellitus with other specified complication: Secondary | ICD-10-CM

## 2020-08-21 NOTE — Telephone Encounter (Signed)
Sharrie Rothman with [facility] asking about this prescription.     They ask if it could be e-scribed to them .    CB#  [phone]

## 2020-08-22 ENCOUNTER — Encounter: Payer: Self-pay | Admitting: Endocrinology

## 2020-08-22 ENCOUNTER — Encounter (INDEPENDENT_AMBULATORY_CARE_PROVIDER_SITE_OTHER): Payer: Self-pay | Admitting: Family Medicine

## 2020-08-25 ENCOUNTER — Ambulatory Visit: Payer: Medicare HMO | Admitting: Internal Medicine

## 2020-08-25 ENCOUNTER — Encounter (INDEPENDENT_AMBULATORY_CARE_PROVIDER_SITE_OTHER): Payer: Self-pay | Admitting: Family Medicine

## 2020-08-27 ENCOUNTER — Other Ambulatory Visit: Payer: Self-pay | Admitting: Internal Medicine

## 2020-08-27 DIAGNOSIS — I10 Essential (primary) hypertension: Secondary | ICD-10-CM

## 2020-08-27 NOTE — Telephone Encounter (Signed)
Requested Prescriptions  Pending Prescriptions Disp Refills  . losartan (COZAAR) 50 MG tablet [Pharmacy Med Name: LOSARTAN POTASSIUM 50 MG Tablet] 90 tablet 0    Sig: TAKE 1 TABLET EVERY DAY     Cardiovascular:  Angiotensin Receptor Blockers Failed - 08/27/2020  7:02 AM      Failed - Cr in normal range and within 180 days    Creatinine  Date Value Ref Range Status  12/29/2019 1.26 (H) 0.44 - 1.00 mg/dL Final  11/20/2016 1.1 0.6 - 1.1 mg/dL Final   Creatinine, Ser  Date Value Ref Range Status  04/17/2020 1.06 (H) 0.57 - 1.00 mg/dL Final   Creatinine, POC  Date Value Ref Range Status  11/13/2016 100 mg/dL Final   Creatinine, Urine  Date Value Ref Range Status  07/27/2015 174 20 - 320 mg/dL Final         Passed - K in normal range and within 180 days    Potassium  Date Value Ref Range Status  04/17/2020 4.2 3.5 - 5.2 mmol/L Final  04/22/2016 3.9 3.5 - 5.1 mEq/L Final         Passed - Patient is not pregnant      Passed - Last BP in normal range    BP Readings from Last 1 Encounters:  08/21/20 124/78         Passed - Valid encounter within last 6 months    Recent Outpatient Visits          4 months ago Need for shingles vaccine   [place], Jarome Matin, RPH-CPP   4 months ago Type 2 diabetes mellitus with obesity [facility])   [place], MD   7 months ago Need for shingles vaccine   [place], Jarome Matin, RPH-CPP   7 months ago Encounter for [employer] annual wellness exam   [place] Ladell Pier, MD   7 months ago Encounter for medication review   [place], RPH-CPP      Future Appointments            In 5 days Ladell Pier, MD [place]           . metFORMIN (GLUCOPHAGE) 1000 MG tablet [Pharmacy Med  Name: METFORMIN HYDROCHLORIDE 1000 MG Tablet] 180 tablet 1    Sig: TAKE 1 TABLET TWICE DAILY WITH MEALS     Endocrinology:  Diabetes - Biguanides Failed - 08/27/2020  7:02 AM      Failed - Cr in normal range and within 360 days    Creatinine  Date Value Ref Range Status  12/29/2019 1.26 (H) 0.44 - 1.00 mg/dL Final  11/20/2016 1.1 0.6 - 1.1 mg/dL Final   Creatinine, Ser  Date Value Ref Range Status  04/17/2020 1.06 (H) 0.57 - 1.00 mg/dL Final   Creatinine, POC  Date Value Ref Range Status  11/13/2016 100 mg/dL Final   Creatinine, Urine  Date Value Ref Range Status  07/27/2015 174 20 - 320 mg/dL Final         Passed - HBA1C is between 0 and 7.9 and within 180 days    Hemoglobin A1C  Date Value Ref Range Status  06/14/2020 5.6 4.0 - 5.6 % Final   HbA1c, POC (controlled diabetic range)  Date Value Ref Range Status  09/23/2019  9.8 (A) 0.0 - 7.0 % Final   Hgb A1c MFr Bld  Date Value Ref Range Status  04/17/2020 6.9 (H) 4.8 - 5.6 % Final    Comment:             Prediabetes: 5.7 - 6.4          Diabetes: >6.4          Glycemic control for adults with diabetes: <7.0          Passed - AA eGFR in normal range and within 360 days    GFR, Est African American  Date Value Ref Range Status  07/08/2014 73 mL/min Final   GFR, Est AFR Am  Date Value Ref Range Status  12/29/2019 56 (L) >60 mL/min Final   GFR calc Af Amer  Date Value Ref Range Status  04/17/2020 69 >59 mL/min/1.73 Final    Comment:    **In accordance with recommendations from the NKF-ASN Task force,**   [facility] is in the process of updating its eGFR calculation to the   2021 CKD-EPI creatinine equation that estimates kidney function   without a race variable.    GFR, Est Non African American  Date Value Ref Range Status  07/08/2014 63 mL/min Final    Comment:      The estimated GFR is a calculation valid for adults (>=51 years old) that uses the CKD-EPI algorithm to adjust for age and sex. It is   not  to be used for children, pregnant women, hospitalized patients,    patients on dialysis, or with rapidly changing kidney function. According to the NKDEP, eGFR >89 is normal, 60-89 shows mild impairment, 30-59 shows moderate impairment, 15-29 shows severe impairment and <15 is ESRD.      GFR, Estimated  Date Value Ref Range Status  12/29/2019 48 (L) >60 mL/min Final   GFR calc non Af Amer  Date Value Ref Range Status  04/17/2020 60 >59 mL/min/1.73 Final   EGFR  Date Value Ref Range Status  11/20/2016 69 (L) >90 ml/min/1.73 m2 Final    Comment:    eGFR is calculated using the CKD-EPI Creatinine Equation (2009)         Passed - Valid encounter within last 6 months    Recent Outpatient Visits          4 months ago Need for shingles vaccine   [place], Annie Main L, RPH-CPP   4 months ago Type 2 diabetes mellitus with obesity [facility])   [place], MD   7 months ago Need for shingles vaccine   [place], Jarome Matin, RPH-CPP   7 months ago Encounter for [employer] annual wellness exam   [place] Ladell Pier, MD   7 months ago Encounter for medication review   [place], RPH-CPP      Future Appointments            In 5 days Ladell Pier, MD [place]

## 2020-08-28 ENCOUNTER — Other Ambulatory Visit: Payer: Self-pay | Admitting: Internal Medicine

## 2020-08-28 NOTE — Progress Notes (Signed)
Chief Complaint:   OBESITY Sarah [name] is here to discuss her progress with her obesity treatment plan along with follow-up of her obesity related diagnoses.   Today's visit was #: 14 Starting weight: 228 lbs Starting date: 02/15/2020 Today's weight: 216 lbs Today's date: 08/21/2020 Total lbs lost to date: 12 lbs Body mass index is 35.94 kg/m.  Total weight loss percentage to date: -5.26%  Interim History:  Sarah [name] skips lunch 50% of the time because she is just not hungry.  She occasionally skips breakfast on some days as well.  With he schedule, it is difficult to eat on a regular basis.  Current Meal Plan: the Category 2 Plan for 80% of the time.  Current Exercise Plan: Walking for 30 minutes 3 times per week. Current Anti-Obesity Medications: Trulicity. Side effects: None.  Assessment/Plan:   1. Type 2 diabetes mellitus with other specified complication, with long-term current use of insulin (HCC) Diabetes Mellitus: At goal. Medication: metformin 1,000 mg twice daily. Issues reviewed: blood sugar goals, complications of diabetes mellitus, hypoglycemia prevention and treatment, exercise, and nutrition.  FBS 103.  Denies lows or highs.  Asymptomatic.   Plan: The importance of regular follow up with PCP and all other specialists as scheduled was stressed to patient today.  Lab Results  Component Value Date   HGBA1C 5.6 06/14/2020   HGBA1C 6.9 (H) 04/17/2020   HGBA1C 9.0 (H) 01/11/2020   Lab Results  Component Value Date   MICROALBUR 80 11/13/2016   LDLCALC 87 07/10/2020   CREATININE 1.06 (H) 04/17/2020   2. Class 2 severe obesity with serious comorbidity and body mass index (BMI) of 36.0 to 36.9 in adult, unspecified obesity type Sarah [name])  Course: Sarah [name] is currently in the action stage of change. As such, her goal is to continue with weight loss efforts.   Nutrition goals: She has agreed to keeping a food journal and adhering to recommended goals of 1000 calories and 90+  grams of protein.   Exercise goals: As is.  Behavioral modification strategies: increasing lean protein intake, meal planning and cooking strategies and planning for success.  Sarah [name] has agreed to follow-up with our clinic in 2 weeks. She was informed of the importance of frequent follow-up visits to maximize her success with intensive lifestyle modifications for her multiple health conditions.   Objective:   Blood pressure 124/78, pulse 74, temperature 97.9 F (36.6 C), height 5\' 5"  (1.651 m), weight 216 lb (98 kg), SpO2 97 %. Body mass index is 35.94 kg/m.  General: Cooperative, alert, well developed, in no acute distress. HEENT: Conjunctivae and lids unremarkable. Cardiovascular: Regular rhythm.  Lungs: Normal work of breathing. Neurologic: No focal deficits.   Lab Results  Component Value Date   CREATININE 1.06 (H) 04/17/2020   BUN 20 04/17/2020   NA 142 04/17/2020   K 4.2 04/17/2020   CL 104 04/17/2020   CO2 24 04/17/2020   Lab Results  Component Value Date   ALT 36 01/11/2020   AST 35 01/11/2020   ALKPHOS 53 01/11/2020   BILITOT 1.0 01/11/2020   Lab Results  Component Value Date   HGBA1C 5.6 06/14/2020   HGBA1C 6.9 (H) 04/17/2020   HGBA1C 9.0 (H) 01/11/2020   HGBA1C 9.8 (A) 09/23/2019   HGBA1C 9.3 (A) 03/22/2019   Lab Results  Component Value Date   TSH 0.938 02/15/2020   Lab Results  Component Value Date   CHOL 161 07/10/2020   HDL 44 07/10/2020   LDLCALC 87  07/10/2020   TRIG 175 (H) 07/10/2020   CHOLHDL 3.7 07/10/2020   Lab Results  Component Value Date   WBC 6.9 02/03/2020   HGB 13.3 02/03/2020   HCT 41.1 02/03/2020   MCV 92.2 02/03/2020   PLT 270 02/03/2020   Lab Results  Component Value Date   IRON 43 08/18/2010   TIBC 389 08/18/2010   FERRITIN 227 08/18/2010   Obesity Behavioral Intervention:   Approximately 15 minutes were spent on the discussion below.  ASK: We discussed the diagnosis of obesity with Sarah [name] today and Sarah [name]  agreed to give [place] permission to discuss obesity behavioral modification therapy today.  ASSESS: Sarah [name] has the diagnosis of obesity and her BMI today is 36.0. Sarah [name] is in the action stage of change.   ADVISE: Sarah [name] was educated on the multiple health risks of obesity as well as the benefit of weight loss to improve her health. She was advised of the need for long term treatment and the importance of lifestyle modifications to improve her current health and to decrease her risk of future health problems.  AGREE: Multiple dietary modification options and treatment options were discussed and Sarah [name] agreed to follow the recommendations documented in the above note.  ARRANGE: Sarah [name] was educated on the importance of frequent visits to treat obesity as outlined per CMS and USPSTF guidelines and agreed to schedule her next follow up appointment today.  Attestation Statements:   Reviewed by clinician on day of visit: allergies, medications, problem list, medical history, surgical history, family history, social history, and previous encounter notes.  I, Water quality scientist, CMA, am acting as Location manager for [employer], DO.  I have reviewed the above documentation for accuracy and completeness, and I agree with the above. Marjory Sneddon, D.O.  The [place] was signed into law in 2016 which includes the topic of electronic health records.  This provides immediate access to information in MyChart.  This includes consultation notes, operative notes, office notes, lab results and pathology reports.  If you have any questions about what you read please let us know at your next visit so we can discuss your concerns and take corrective action if need be.  We are right here with you.

## 2020-08-28 NOTE — Telephone Encounter (Signed)
Last seen by Dr. Opalski. 

## 2020-08-29 NOTE — Telephone Encounter (Signed)
fyi

## 2020-08-29 NOTE — Telephone Encounter (Signed)
Please advise 

## 2020-09-01 ENCOUNTER — Other Ambulatory Visit: Payer: Self-pay

## 2020-09-01 ENCOUNTER — Ambulatory Visit: Payer: Medicare HMO | Attending: Internal Medicine | Admitting: Internal Medicine

## 2020-09-01 DIAGNOSIS — J302 Other seasonal allergic rhinitis: Secondary | ICD-10-CM

## 2020-09-01 DIAGNOSIS — E669 Obesity, unspecified: Secondary | ICD-10-CM

## 2020-09-01 DIAGNOSIS — I10 Essential (primary) hypertension: Secondary | ICD-10-CM

## 2020-09-01 DIAGNOSIS — R911 Solitary pulmonary nodule: Secondary | ICD-10-CM

## 2020-09-01 DIAGNOSIS — E1169 Type 2 diabetes mellitus with other specified complication: Secondary | ICD-10-CM | POA: Diagnosis not present

## 2020-09-01 NOTE — Progress Notes (Signed)
Pt states she has a bad cough with a little mucous and the cough is keeping her up at night

## 2020-09-01 NOTE — Progress Notes (Signed)
Virtual Visit via Telephone Note  I connected with Sarah [name] on 09/01/20 at 12:02 p.m by telephone and verified that I am speaking with the correct person using two identifiers.  Location: Patient: parked in car Provider: office Participants: Myself Patient CMA: [name]   I discussed the limitations, risks, security and privacy concerns of performing an evaluation and management service by telephone and the availability of in person appointments. I also discussed with the patient that there may be a patient responsible charge related to this service. The patient expressed understanding and agreed to proceed.   History of Present Illness: DM with peripheral neuropathy(more so due to chemo), HTN, HL, OSA,Vit D def,midline LBP,stage IIIc low-grade serous carcinoma of the ovaryS/p BSO, omentectomy, appendectomy on 07/28/14.(S/p adjuvant chemotherapy completed on 12/08/14. S/p ex lap, hernia repair and small bowel resection for a presumed recurrence (benign pathology), Hypercalcemia with elevated free light chains/likely MGUS, had BM bx (followed by [name]), 1.1 cm RLL nodule (bx negative 01/2020), RT thyroid nodule (does not met criteria for bx 10/2019), Vit D def Last eval 04/2020.  Today's visit is for chronic ds management  C/o cough mostly dry x 1 wk.  No nasal congestion. Occasional sore throat.  Some itchy water eyes.  Little sneezing.  No fever, no recent sick contacts. Due for COVID booster.  Obesity/DM: still doing Med Wgh management Trulicity increased to 3 mg and tolerating.  No N/V/abdominal pain with increased dose. Doing better with eating habits.  Loss 16 lbs since August of last year. Checking BS TID before meals. Fasting BS have been less than 120.  Last A1C was 5.6 in January 2022.  Reports compliance with Metformin and Lantus insulin. Does Insta-cart and some days she does You-tube exercise videos  HTN:  Checks BP 1-2 times a wk. Gives readings 130/70,  112/80 Compliant with meds and limits salt.  HCTZ was discontinued by the endocrinologist [name] during the time he was evaluating the hypercalcemia.  Patient has not restarted the medicine.  She has not had any lower extremity edema without it. No CP/LE edema/HA   Hypercalcemia: She saw [name] in January and February of this year.  Calcium level remained elevated after having her stop HCTZ for 1 month.  He did a 24-hour urine calcium and her level came back in the low normal range.  He told her that the kidneys are not eliminating the calcium well.  He gave her the option of just keeping an eye on this or taking a pill for it.  Patient preferred observation.    Lung nodule: saw Dr. Roxan Hockey last month.  He recommends removal of right lobe nodule even though pathology was negative.  His concern is that the nodule has been slow-growing.  On bronchoscopy he could see the lesion and he suspect it may be a low-grade carcinoid or bronchial adenoma that was pushing on the bronchial mucosal.  He propose removing it via VATS for wedge resection.  Patient is concerned and wonders whether it will affect her abdominal hernia.  Outpatient Encounter Medications as of 09/01/2020  Medication Sig  . ACCU-CHEK AVIVA PLUS test strip TEST BLOOD SUGAR AS DIRECTED  . albuterol (VENTOLIN HFA) 108 (90 Base) MCG/ACT inhaler Inhale 2 puffs into the lungs every 6 (six) hours as needed for wheezing or shortness of breath.  Marland Kitchen amLODipine (NORVASC) 10 MG tablet Take 1 tablet (10 mg total) by mouth daily.  . Blood Glucose Monitoring Suppl (ACCU-CHEK AVIVA PLUS) w/Device KIT  USE AS DIRECTED  . DROPLET PEN NEEDLES 29G X 12MM MISC USE AS DIRECTED  . Dulaglutide (TRULICITY) 3 SW/9.6PR SOPN Inject 3 mg as directed once a week.  . gabapentin (NEURONTIN) 600 MG tablet Take 1 tablet (600 mg total) by mouth 3 (three) times daily.  . hydrALAZINE (APRESOLINE) 25 MG tablet TAKE 1 AND 1/2 TABLETS THREE TIMES DAILY  .  hydrochlorothiazide (HYDRODIURIL) 25 MG tablet TAKE 1/2 TABLET EVERY DAY  . insulin glargine (LANTUS SOLOSTAR) 100 UNIT/ML Solostar Pen INJECT 30 UNITS DAILY. INCREASE BY 2 UNITS EVERY 3 DAYS UNTIL MORNING SUGAR IS LESS THAN 130. MAX OF 40 UNITS.  Marland Kitchen Lancets (ACCU-CHEK MULTICLIX) lancets   . Lancets (ACCU-CHEK SOFT TOUCH) lancets Use as instructed  . Lancets Misc. (ACCU-CHEK SOFTCLIX LANCET DEV) KIT Use as directed  . losartan (COZAAR) 50 MG tablet TAKE 1 TABLET EVERY DAY  . meloxicam (MOBIC) 15 MG tablet TAKE 1 TABLET EVERY DAY  . metFORMIN (GLUCOPHAGE) 1000 MG tablet TAKE 1 TABLET TWICE DAILY WITH MEALS  . metoprolol tartrate (LOPRESSOR) 25 MG tablet TAKE 1/2 TABLET TWICE DAILY  . rosuvastatin (CRESTOR) 20 MG tablet TAKE 1 TABLET EVERY DAY. STOP ATORVASTATIN  . timolol (BETIMOL) 0.5 % ophthalmic solution Place 1 drop into both eyes daily.  . Travoprost, BAK Free, (TRAVATAN) 0.004 % SOLN ophthalmic solution Place 1 drop into both eyes at bedtime.  . vitamin B-12 (CYANOCOBALAMIN) 500 MCG tablet Take 1 tablet (500 mcg total) by mouth daily.  . Vitamin D, Ergocalciferol, (DRISDOL) 1.25 MG (50000 UNIT) CAPS capsule Take 1 capsule (50,000 Units total) by mouth every 7 (seven) days.   No facility-administered encounter medications on file as of 09/01/2020.      Observations/Objective: Wt Readings from Last 3 Encounters:  08/21/20 216 lb (98 kg)  08/09/20 216 lb (98 kg)  07/24/20 218 lb (98.9 kg)  Most recent BMI is 35.9.   Depression screen [facility] 2/9 04/28/2020 02/15/2020 02/15/2020  Decreased Interest 0 2 3  Down, Depressed, Hopeless 0 3 1  PHQ - 2 Score 0 5 4  Altered sleeping - 3 0  Tired, decreased energy - 3 2  Change in appetite - 2 2  Feeling bad or failure about yourself  - 3 0  Trouble concentrating - 0 0  Moving slowly or fidgety/restless - 0 0  Suicidal thoughts - 0 -  PHQ-9 Score - 16 8  Some recent data might be hidden    Lab Results  Component Value Date   WBC 6.9  02/03/2020   HGB 13.3 02/03/2020   HCT 41.1 02/03/2020   MCV 92.2 02/03/2020   PLT 270 02/03/2020     Chemistry      Component Value Date/Time   NA 142 04/17/2020 0928   NA 138 04/22/2016 0750   K 4.2 04/17/2020 0928   K 3.9 04/22/2016 0750   CL 104 04/17/2020 0928   CO2 24 04/17/2020 0928   CO2 26 04/22/2016 0750   BUN 20 04/17/2020 0928   BUN 19.1 11/20/2016 1355   CREATININE 1.06 (H) 04/17/2020 0928   CREATININE 1.26 (H) 12/29/2019 1015   CREATININE 1.1 11/20/2016 1355      Component Value Date/Time   CALCIUM 11.7 (H) 07/26/2020 1010   CALCIUM 9.8 04/22/2016 0750   ALKPHOS 53 01/11/2020 1017   ALKPHOS 74 04/22/2016 0750   AST 35 01/11/2020 1017   AST 29 12/29/2019 1015   AST 27 04/22/2016 0750   ALT 36 01/11/2020 1017  ALT 33 12/29/2019 1015   ALT 24 04/22/2016 0750   BILITOT 1.0 01/11/2020 1017   BILITOT 0.6 12/29/2019 1015   BILITOT 0.44 04/22/2016 0750      Assessment and Plan: 1. Type 2 diabetes mellitus with obesity ([place]) Well-controlled.  Encouraged her to continue healthy eating habits and trying to move as much as she can.  She is tolerating higher dose of Trulicity as prescribed by her weight management physician.  She reports no hypoglycemic episode on current Lantus dose.  2.  Essential hypertension Controlled.  Continue with current medications.  HCTZ removed from her med list as she is no longer taking and blood pressure control without it.  3. Seasonal allergies Cough likely due to allergies.  I recommend trying Claritin over-the-counter.  4. Lung nodule, solitary I went over with patient Dr. Leonarda Salon reasoning for wanting to remove the lung nodule.  She had some questions about whether it will affect the abdominal hernia which I think it will not but I told her to speak with Dr. Koleen Nimrod about her concern.  5. Hypercalcemia Based on results done by [name], this seems to be what is called familial hypercalciuria hypercalcemia which is  hereditary and benign.   Follow Up Instructions: 4 months   I discussed the assessment and treatment plan with the patient. The patient was provided an opportunity to ask questions and all were answered. The patient agreed with the plan and demonstrated an understanding of the instructions.   The patient was advised to call back or seek an in-person evaluation if the symptoms worsen or if the condition fails to improve as anticipated.  I spent 20 minutes on this telephone encounter.  Karle Plumber, MD

## 2020-09-03 ENCOUNTER — Other Ambulatory Visit: Payer: Self-pay | Admitting: Internal Medicine

## 2020-09-03 DIAGNOSIS — I10 Essential (primary) hypertension: Secondary | ICD-10-CM

## 2020-09-03 NOTE — Telephone Encounter (Signed)
No future visit. Approved per protocol. Requested Prescriptions  Pending Prescriptions Disp Refills  . metoprolol tartrate (LOPRESSOR) 25 MG tablet [Pharmacy Med Name: METOPROLOL TARTRATE 25 MG Tablet] 90 tablet 0    Sig: TAKE 1/2 TABLET TWICE DAILY     Cardiovascular:  Beta Blockers Passed - 09/03/2020  1:39 PM      Passed - Last BP in normal range    BP Readings from Last 1 Encounters:  08/21/20 124/78         Passed - Last Heart Rate in normal range    Pulse Readings from Last 1 Encounters:  08/21/20 74         Passed - Valid encounter within last 6 months    Recent Outpatient Visits          2 days ago Type 2 diabetes mellitus with obesity ([place])   [place], [place]   4 months ago Need for shingles vaccine   [place], Annie Main L, RPH-CPP   4 months ago Type 2 diabetes mellitus with obesity [facility])   [place], MD   7 months ago Need for shingles vaccine   [place], Jarome Matin, RPH-CPP   7 months ago Encounter for [employer] annual wellness exam   [facility] Wynetta Emery, Dalbert Batman, MD

## 2020-09-04 ENCOUNTER — Other Ambulatory Visit (INDEPENDENT_AMBULATORY_CARE_PROVIDER_SITE_OTHER): Payer: Self-pay | Admitting: Family Medicine

## 2020-09-04 ENCOUNTER — Encounter (INDEPENDENT_AMBULATORY_CARE_PROVIDER_SITE_OTHER): Payer: Self-pay | Admitting: Family Medicine

## 2020-09-04 ENCOUNTER — Ambulatory Visit (INDEPENDENT_AMBULATORY_CARE_PROVIDER_SITE_OTHER): Payer: Medicare HMO | Admitting: Family Medicine

## 2020-09-04 ENCOUNTER — Other Ambulatory Visit: Payer: Self-pay

## 2020-09-04 VITALS — BP 138/86 | HR 66 | Temp 97.9°F | Ht 65.0 in | Wt 214.0 lb

## 2020-09-04 DIAGNOSIS — E559 Vitamin D deficiency, unspecified: Secondary | ICD-10-CM

## 2020-09-04 DIAGNOSIS — J302 Other seasonal allergic rhinitis: Secondary | ICD-10-CM | POA: Diagnosis not present

## 2020-09-04 DIAGNOSIS — E1169 Type 2 diabetes mellitus with other specified complication: Secondary | ICD-10-CM

## 2020-09-04 DIAGNOSIS — Z6837 Body mass index (BMI) 37.0-37.9, adult: Secondary | ICD-10-CM

## 2020-09-04 MED ORDER — TRULICITY 3 MG/0.5ML ~~LOC~~ SOAJ: 3.0000 mg | SUBCUTANEOUS | 0 refills | Status: DC

## 2020-09-04 MED ORDER — LEVOCETIRIZINE DIHYDROCHLORIDE 5 MG PO TABS: 5.0000 mg | ORAL_TABLET | Freq: Every evening | ORAL | 0 refills | Status: DC

## 2020-09-04 MED ORDER — MONTELUKAST SODIUM 10 MG PO TABS: 10.0000 mg | ORAL_TABLET | Freq: Every day | ORAL | 0 refills | Status: DC

## 2020-09-04 MED ORDER — VITAMIN D (ERGOCALCIFEROL) 1.25 MG (50000 UNIT) PO CAPS: 50000.0000 [IU] | ORAL_CAPSULE | ORAL | 0 refills | Status: DC

## 2020-09-05 DIAGNOSIS — J302 Other seasonal allergic rhinitis: Secondary | ICD-10-CM | POA: Insufficient documentation

## 2020-09-06 ENCOUNTER — Encounter: Payer: Self-pay | Admitting: Internal Medicine

## 2020-09-09 ENCOUNTER — Encounter (INDEPENDENT_AMBULATORY_CARE_PROVIDER_SITE_OTHER): Payer: Self-pay | Admitting: Family Medicine

## 2020-09-11 ENCOUNTER — Encounter: Payer: Self-pay | Admitting: Internal Medicine

## 2020-09-11 ENCOUNTER — Other Ambulatory Visit: Payer: Self-pay | Admitting: Internal Medicine

## 2020-09-11 MED ORDER — ACCU-CHEK SOFTCLIX LANCETS MISC: 12 refills | Status: DC

## 2020-09-11 NOTE — Addendum Note (Signed)
Addended by: Karle Plumber B on: 09/11/2020 06:02 PM   Modules accepted: Orders

## 2020-09-12 NOTE — Progress Notes (Signed)
Chief Complaint:   OBESITY Sarah [name] is here to discuss her progress with her obesity treatment plan along with follow-up of her obesity related diagnoses.   Today's visit was #: 15 Starting weight: 228 lbs Starting date: 02/15/2020 Today's weight: 214 lbs Today's date: 09/04/2020 Total lbs lost to date: 14 lbs Body mass index is 35.61 kg/m.  Total weight loss percentage to date: -6.14%  Interim History:  Sarah [name] is here for a follow up office visit and she is following the meal plan without concerns or issues.  Patient's meal and food recall appears to be accurate and consistent with what is on the plan.  When on plan, her hunger and cravings are well controlled.   [name] still skips lunch most days, and occasionally skips breakfast.  Current Meal Plan: keeping a food journal and adhering to recommended goals of 1000 calories and 90+ grams of protein for 90% of the time.  Current Exercise Plan: None. Current Anti-Obesity Medications: Trulicity. Side effects: None.   Assessment/Plan:    Medications Discontinued During This Encounter  Medication Reason  . Vitamin D, Ergocalciferol, (DRISDOL) 1.25 MG (50000 UNIT) CAPS capsule Reorder  . Dulaglutide (TRULICITY) 3 YQ/0.3KV SOPN Reorder     Meds ordered this encounter  Medications  . Dulaglutide (TRULICITY) 3 QQ/5.9DG SOPN    Sig: Inject 3 mg as directed once a week.    Dispense:  2 mL    Refill:  0    30 d only- pt needs OV for RF  . Vitamin D, Ergocalciferol, (DRISDOL) 1.25 MG (50000 UNIT) CAPS capsule    Sig: Take 1 capsule (50,000 Units total) by mouth every 7 (seven) days.    Dispense:  4 capsule    Refill:  0    30 d only- pt needs OV for RF  . levocetirizine (XYZAL) 5 MG tablet    Sig: Take 1 tablet (5 mg total) by mouth every evening.    Dispense:  90 tablet    Refill:  0  . montelukast (SINGULAIR) 10 MG tablet    Sig: Take 1 tablet (10 mg total) by mouth at bedtime.    Dispense:  90 tablet    Refill:  0      1. Type 2 diabetes mellitus with other specified complication, without long-term current use of insulin (HCC) Diabetes Mellitus: At goal. Medication: Trulicity, metformin 3,875 mg twice daily, Lantus 30 units daily. Issues reviewed: blood sugar goals, complications of diabetes mellitus, hypoglycemia prevention and treatment, exercise, and nutrition.  FBS 103, 113, 120, never over 120, never under 100.  No concerns or issues.  Plan:  Discussed labs with patient today again.    With decreased A1c of 5.6, we discussed that her PCP can possibly consider discontinuing insulin and consider monitoring on current other meds and/or substituting Jardiance for insulin if PCP feels A1c would jump too much if pt comes off insulin.    We discussed that since her PCP knows her best, and her history best, she will ask next OV/ get in touch w PCP to she what she feels is best.  The importance of regular follow up with PCP and all other specialists as scheduled was stressed to patient today.  Lab Results  Component Value Date   HGBA1C 5.6 06/14/2020   HGBA1C 6.9 (H) 04/17/2020   HGBA1C 9.0 (H) 01/11/2020   Lab Results  Component Value Date   MICROALBUR 80 11/13/2016   LDLCALC 87 07/10/2020   CREATININE  1.06 (H) 04/17/2020   - Refill Dulaglutide (TRULICITY) 3 ZO/1.0RU SOPN; Inject 3 mg as directed once a week.  Dispense: 2 mL; Refill: 0    2. Seasonal allergies Increased flare of Sx over the past 2-3 weeks.  No OTC medications.  Denies fever/chills.  Endorses headache, runny nose, scratchy throat.  Occasional shortness of breath, wheeze if outside for too long.  Plan:  Start Xyzal, and Singulair due to pulm sx.   Avoid exposure/ wear mask outside.  Sinus nasal rinses and shower after being outside for too long.  - Start levocetirizine (XYZAL) 5 MG tablet; Take 1 tablet (5 mg total) by mouth every evening.  Dispense: 90 tablet; Refill: 0 - Start montelukast (SINGULAIR) 10 MG tablet; Take 1 tablet (10  mg total) by mouth at bedtime.  Dispense: 90 tablet; Refill: 0    3. Vitamin D deficiency Not at goal. Current vitamin D is 24.0, tested on 07/26/2020. Optimal goal > 50 ng/dL.  She is taking vitamin D 50,000 IU weekly.  Plan: Continue to take prescription Vitamin D @50 ,000 IU every week as prescribed.  Follow-up for routine testing of Vitamin D, at least 2-3 times per year to avoid over-replacement.  - Refill Vitamin D, Ergocalciferol, (DRISDOL) 1.25 MG (50000 UNIT) CAPS capsule; Take 1 capsule (50,000 Units total) by mouth every 7 (seven) days.  Dispense: 4 capsule; Refill: 0    4. Obesity, current BMI 35.6  Course: [name] is currently in the action stage of change. As such, her goal is to continue with weight loss efforts.   Nutrition goals: She has agreed to the Category 2 Plan or keeping a food journal and adhering to recommended goals of 1000 calories and 90+ grams of protein.   Exercise goals: All adults should avoid inactivity. Some physical activity is better than none, and adults who participate in any amount of physical activity gain some health benefits.  Behavioral modification strategies: no skipping meals and meal planning and cooking strategies.  [name] has agreed to follow-up with our clinic in 3 weeks. She was informed of the importance of frequent follow-up visits to maximize her success with intensive lifestyle modifications for her multiple health conditions.   Objective:   Blood pressure 138/86, pulse 66, temperature 97.9 F (36.6 C), height 5\' 5"  (1.651 m), weight 214 lb (97.1 kg), SpO2 97 %. Body mass index is 35.61 kg/m.  General: Cooperative, alert, well developed, in no acute distress. HEENT: Conjunctivae and lids unremarkable. Cardiovascular: Regular rhythm.  Lungs: Normal work of breathing. Neurologic: No focal deficits.   Lab Results  Component Value Date   CREATININE 1.06 (H) 04/17/2020   BUN 20 04/17/2020   NA 142 04/17/2020   K 4.2 04/17/2020    CL 104 04/17/2020   CO2 24 04/17/2020   Lab Results  Component Value Date   ALT 36 01/11/2020   AST 35 01/11/2020   ALKPHOS 53 01/11/2020   BILITOT 1.0 01/11/2020   Lab Results  Component Value Date   HGBA1C 5.6 06/14/2020   HGBA1C 6.9 (H) 04/17/2020   HGBA1C 9.0 (H) 01/11/2020   HGBA1C 9.8 (A) 09/23/2019   HGBA1C 9.3 (A) 03/22/2019   Lab Results  Component Value Date   TSH 0.938 02/15/2020   Lab Results  Component Value Date   CHOL 161 07/10/2020   HDL 44 07/10/2020   LDLCALC 87 07/10/2020   TRIG 175 (H) 07/10/2020   CHOLHDL 3.7 07/10/2020   Lab Results  Component Value Date  WBC 6.9 02/03/2020   HGB 13.3 02/03/2020   HCT 41.1 02/03/2020   MCV 92.2 02/03/2020   PLT 270 02/03/2020   Lab Results  Component Value Date   IRON 43 08/18/2010   TIBC 389 08/18/2010   FERRITIN 227 08/18/2010   Obesity Behavioral Intervention:   Approximately 15 minutes were spent on the discussion below.  ASK: We discussed the diagnosis of obesity with [name] today and Jessah agreed to give [place] permission to discuss obesity behavioral modification therapy today.  ASSESS: Karol has the diagnosis of obesity and her BMI today is 35.8. Lavette is in the action stage of change.   ADVISE: Ayomide was educated on the multiple health risks of obesity as well as the benefit of weight loss to improve her health. She was advised of the need for long term treatment and the importance of lifestyle modifications to improve her current health and to decrease her risk of future health problems.  AGREE: Multiple dietary modification options and treatment options were discussed and Christinea agreed to follow the recommendations documented in the above note.  ARRANGE: Burkley was educated on the importance of frequent visits to treat obesity as outlined per CMS and USPSTF guidelines and agreed to schedule her next follow up appointment today.  Attestation Statements:   Reviewed by clinician on day  of visit: allergies, medications, problem list, medical history, surgical history, family history, social history, and previous encounter notes.  I, Water quality scientist, CMA, am acting as Location manager for [employer], DO.  I have reviewed the above documentation for accuracy and completeness, and I agree with the above. Marjory Sneddon, D.O.  The [place] was signed into law in 2016 which includes the topic of electronic health records.  This provides immediate access to information in MyChart.  This includes consultation notes, operative notes, office notes, lab results and pathology reports.  If you have any questions about what you read please let us know at your next visit so we can discuss your concerns and take corrective action if need be.  We are right here with you.

## 2020-09-14 ENCOUNTER — Encounter (INDEPENDENT_AMBULATORY_CARE_PROVIDER_SITE_OTHER): Payer: Self-pay | Admitting: Family Medicine

## 2020-09-17 ENCOUNTER — Encounter (INDEPENDENT_AMBULATORY_CARE_PROVIDER_SITE_OTHER): Payer: Self-pay

## 2020-09-18 ENCOUNTER — Ambulatory Visit (INDEPENDENT_AMBULATORY_CARE_PROVIDER_SITE_OTHER): Payer: Medicare HMO | Admitting: Family Medicine

## 2020-09-25 ENCOUNTER — Other Ambulatory Visit (INDEPENDENT_AMBULATORY_CARE_PROVIDER_SITE_OTHER): Payer: Self-pay | Admitting: Family Medicine

## 2020-09-25 DIAGNOSIS — E1169 Type 2 diabetes mellitus with other specified complication: Secondary | ICD-10-CM

## 2020-09-25 NOTE — Telephone Encounter (Signed)
Pt last seen by Dr. Opalski.  

## 2020-10-09 ENCOUNTER — Other Ambulatory Visit: Payer: Self-pay

## 2020-10-09 ENCOUNTER — Encounter: Payer: Self-pay | Admitting: Internal Medicine

## 2020-10-09 ENCOUNTER — Encounter (INDEPENDENT_AMBULATORY_CARE_PROVIDER_SITE_OTHER): Payer: Self-pay | Admitting: Family Medicine

## 2020-10-09 ENCOUNTER — Ambulatory Visit (INDEPENDENT_AMBULATORY_CARE_PROVIDER_SITE_OTHER): Payer: Medicare HMO | Admitting: Family Medicine

## 2020-10-09 VITALS — BP 162/88 | HR 75 | Temp 97.9°F | Ht 65.0 in | Wt 216.0 lb

## 2020-10-09 DIAGNOSIS — Z6837 Body mass index (BMI) 37.0-37.9, adult: Secondary | ICD-10-CM | POA: Diagnosis not present

## 2020-10-09 DIAGNOSIS — I152 Hypertension secondary to endocrine disorders: Secondary | ICD-10-CM

## 2020-10-09 DIAGNOSIS — E1169 Type 2 diabetes mellitus with other specified complication: Secondary | ICD-10-CM

## 2020-10-09 DIAGNOSIS — E559 Vitamin D deficiency, unspecified: Secondary | ICD-10-CM

## 2020-10-09 DIAGNOSIS — E1159 Type 2 diabetes mellitus with other circulatory complications: Secondary | ICD-10-CM

## 2020-10-09 DIAGNOSIS — J302 Other seasonal allergic rhinitis: Secondary | ICD-10-CM

## 2020-10-09 MED ORDER — VITAMIN D (ERGOCALCIFEROL) 1.25 MG (50000 UNIT) PO CAPS
50000.0000 [IU] | ORAL_CAPSULE | ORAL | 0 refills | Status: DC
Start: 2020-10-09 — End: 2020-10-09

## 2020-10-09 MED ORDER — VITAMIN D (ERGOCALCIFEROL) 1.25 MG (50000 UNIT) PO CAPS: 50000.0000 [IU] | ORAL_CAPSULE | ORAL | 0 refills | Status: DC

## 2020-10-09 MED ORDER — TRULICITY 3 MG/0.5ML ~~LOC~~ SOAJ: 3.0000 mg | SUBCUTANEOUS | 0 refills | Status: DC

## 2020-10-09 MED ORDER — TRULICITY 3 MG/0.5ML ~~LOC~~ SOAJ
3.0000 mg | SUBCUTANEOUS | 0 refills | Status: DC
Start: 2020-10-09 — End: 2020-11-14

## 2020-10-16 NOTE — Progress Notes (Signed)
Chief Complaint:   OBESITY Sarah [name] is here to discuss her progress with her obesity treatment plan along with follow-up of her obesity related diagnoses.   Today's visit was #: 16 Starting weight: 228 lbs Starting date: 02/15/2020 Today's weight: 216 lbs Today's date: 10/09/2020 Total lbs lost to date: 12 lbs Body mass index is 35.94 kg/m.  Total weight loss percentage to date: -5.26%  Interim History:  Sarah [name] says she has been snacking a lot more than usual.  She has been having more chips and ice cream.  She likes the plan and there is no need for a change.  Current Meal Plan: the Category 2 Plan and keeping a food journal or adhering to recommended goals of 1000 calories and 90+ grams of protein for 50% of the time.  Current Exercise Plan: None. Current Anti-Obesity Medications: Trulicity 3 mg weekly. Side effects: None.  Assessment/Plan:   1. Type 2 diabetes mellitus with other specified complication, without long-term current use of insulin (HCC) Diabetes Mellitus: At goal. Medication: Trulicity 3 mg weekly, Lantus, and metformin 1,000 mg twice daily. 136 or lower in the mornings.  No highs or lows.  Issues reviewed: blood sugar goals, complications of diabetes mellitus, hypoglycemia prevention and treatment, exercise, and nutrition.   Plan:  Continue medications for now and she will message her PCP regarding possible changes to diabetic meds.  A1c is at goal. The importance of regular follow up with PCP and all other specialists as scheduled was stressed to patient today.  Lab Results  Component Value Date   HGBA1C 5.6 06/14/2020   HGBA1C 6.9 (H) 04/17/2020   HGBA1C 9.0 (H) 01/11/2020   Lab Results  Component Value Date   MICROALBUR 80 11/13/2016   LDLCALC 87 07/10/2020   CREATININE 1.06 (H) 04/17/2020   - Refill Dulaglutide (TRULICITY) 3 UJ/8.1XB SOPN; Inject 3 mg as directed once a week.  Dispense: 2 mL; Refill: 0  2. Hypertension associated with type 2 diabetes  mellitus ([place]) Not at goal. Medications: Norvasc 10 mg daily, Cozaar 50 mg daily, Lopressor 12.5 mg mg twice daily.  She did not take her medications yet today.  No headache, dizziness, or vision changes.   Plan:  Not at goal.  Take medications and check at home.  Goal 130/80.  Decrease salt intake and increase water intake.  Avoid buying foods that are: processed, frozen, or prepackaged to avoid excess salt. We will continue to monitor closely alongside her PCP and/or Specialist.  Regular follow up with PCP and specialists was also encouraged.   BP Readings from Last 3 Encounters:  10/09/20 (!) 162/88  09/04/20 138/86  08/21/20 124/78   Lab Results  Component Value Date   CREATININE 1.06 (H) 04/17/2020   3. Vitamin D deficiency Not at goal. Current vitamin D is 24.0, tested on 07/26/2020. Optimal goal > 50 ng/dL.  She is taking vitamin D 50,000 IU weekly.  Plan: Continue to take prescription Vitamin D @50 ,000 IU every week as prescribed.  Follow-up for routine testing of Vitamin D, at least 2-3 times per year to avoid over-replacement.  - Refill Vitamin D, Ergocalciferol, (DRISDOL) 1.25 MG (50000 UNIT) CAPS capsule; Take 1 capsule (50,000 Units total) by mouth every 7 (seven) days.  Dispense: 4 capsule; Refill: 0  4. Seasonal allergies Started Singulair and Xyzal at last office visit.  Working very well.  Plan:  Continue medications and preventative strategies.   5. Obesity, current BMI 36.0  Course: Sarah [name] is currently  in the action stage of change. As such, her goal is to continue with weight loss efforts.   Nutrition goals: She has agreed to the Category 2 Plan and keeping a food journal or adhering to recommended goals of 1000 calories and 90 grams of protein.   Exercise goals: All adults should avoid inactivity. Some physical activity is better than none, and adults who participate in any amount of physical activity gain some health benefits. Walking 10 minutes daily to  start.  Behavioral modification strategies: keeping healthy foods in the home, better snacking choices, avoiding temptations and planning for success.  Sarah [name] has agreed to follow-up with our clinic in 2-3 weeks. She was informed of the importance of frequent follow-up visits to maximize her success with intensive lifestyle modifications for her multiple health conditions.   Objective:   Blood pressure (!) 162/88, pulse 75, temperature 97.9 F (36.6 C), height 5\' 5"  (1.651 m), weight 216 lb (98 kg), SpO2 97 %. Body mass index is 35.94 kg/m.  General: Cooperative, alert, well developed, in no acute distress. HEENT: Conjunctivae and lids unremarkable. Cardiovascular: Regular rhythm.  Lungs: Normal work of breathing. Neurologic: No focal deficits.   Lab Results  Component Value Date   CREATININE 1.06 (H) 04/17/2020   BUN 20 04/17/2020   NA 142 04/17/2020   K 4.2 04/17/2020   CL 104 04/17/2020   CO2 24 04/17/2020   Lab Results  Component Value Date   ALT 36 01/11/2020   AST 35 01/11/2020   ALKPHOS 53 01/11/2020   BILITOT 1.0 01/11/2020   Lab Results  Component Value Date   HGBA1C 5.6 06/14/2020   HGBA1C 6.9 (H) 04/17/2020   HGBA1C 9.0 (H) 01/11/2020   HGBA1C 9.8 (A) 09/23/2019   HGBA1C 9.3 (A) 03/22/2019   Lab Results  Component Value Date   TSH 0.938 02/15/2020   Lab Results  Component Value Date   CHOL 161 07/10/2020   HDL 44 07/10/2020   LDLCALC 87 07/10/2020   TRIG 175 (H) 07/10/2020   CHOLHDL 3.7 07/10/2020   Lab Results  Component Value Date   WBC 6.9 02/03/2020   HGB 13.3 02/03/2020   HCT 41.1 02/03/2020   MCV 92.2 02/03/2020   PLT 270 02/03/2020   Lab Results  Component Value Date   IRON 43 08/18/2010   TIBC 389 08/18/2010   FERRITIN 227 08/18/2010   Obesity Behavioral Intervention:   Approximately 15 minutes were spent on the discussion below.  ASK: We discussed the diagnosis of obesity with Sarah [name] today and Sarah [name] agreed to give [place]  permission to discuss obesity behavioral modification therapy today.  ASSESS: Sarah [name] has the diagnosis of obesity and her BMI today is 36.0. Sarah [name] is in the action stage of change.   ADVISE: Sarah [name] was educated on the multiple health risks of obesity as well as the benefit of weight loss to improve her health. She was advised of the need for long term treatment and the importance of lifestyle modifications to improve her current health and to decrease her risk of future health problems.  AGREE: Multiple dietary modification options and treatment options were discussed and Kolina agreed to follow the recommendations documented in the above note.  ARRANGE: Rylann was educated on the importance of frequent visits to treat obesity as outlined per CMS and USPSTF guidelines and agreed to schedule her next follow up appointment today.  Attestation Statements:   Reviewed by clinician on day of visit: allergies, medications, problem list, medical history, surgical  history, family history, social history, and previous encounter notes.  I, Water quality scientist, CMA, am acting as Location manager for [employer], DO.  I have reviewed the above documentation for accuracy and completeness, and I agree with the above. Sarah [name], D.O.  The [place] was signed into law in 2016 which includes the topic of electronic health records.  This provides immediate access to information in MyChart.  This includes consultation notes, operative notes, office notes, lab results and pathology reports.  If you have any questions about what you read please let us know at your next visit so we can discuss your concerns and take corrective action if need be.  We are right here with you.

## 2020-10-18 ENCOUNTER — Other Ambulatory Visit: Payer: Self-pay | Admitting: Podiatry

## 2020-10-18 NOTE — Telephone Encounter (Signed)
Please advise 

## 2020-10-23 ENCOUNTER — Other Ambulatory Visit: Payer: Self-pay

## 2020-10-23 ENCOUNTER — Ambulatory Visit (INDEPENDENT_AMBULATORY_CARE_PROVIDER_SITE_OTHER): Payer: Medicare HMO | Admitting: Family Medicine

## 2020-10-23 ENCOUNTER — Encounter (INDEPENDENT_AMBULATORY_CARE_PROVIDER_SITE_OTHER): Payer: Self-pay | Admitting: Family Medicine

## 2020-10-23 ENCOUNTER — Encounter: Payer: Self-pay | Admitting: Internal Medicine

## 2020-10-23 VITALS — BP 147/88 | HR 75 | Temp 98.0°F | Ht 65.0 in | Wt 220.0 lb

## 2020-10-23 DIAGNOSIS — I152 Hypertension secondary to endocrine disorders: Secondary | ICD-10-CM | POA: Diagnosis not present

## 2020-10-23 DIAGNOSIS — Z794 Long term (current) use of insulin: Secondary | ICD-10-CM | POA: Diagnosis not present

## 2020-10-23 DIAGNOSIS — E559 Vitamin D deficiency, unspecified: Secondary | ICD-10-CM

## 2020-10-23 DIAGNOSIS — R4589 Other symptoms and signs involving emotional state: Secondary | ICD-10-CM | POA: Insufficient documentation

## 2020-10-23 DIAGNOSIS — E1159 Type 2 diabetes mellitus with other circulatory complications: Secondary | ICD-10-CM | POA: Diagnosis not present

## 2020-10-23 DIAGNOSIS — Z6837 Body mass index (BMI) 37.0-37.9, adult: Secondary | ICD-10-CM | POA: Diagnosis not present

## 2020-10-23 DIAGNOSIS — E1169 Type 2 diabetes mellitus with other specified complication: Secondary | ICD-10-CM | POA: Diagnosis not present

## 2020-10-23 MED ORDER — VITAMIN D (ERGOCALCIFEROL) 1.25 MG (50000 UNIT) PO CAPS: 50000.0000 [IU] | ORAL_CAPSULE | ORAL | 0 refills | Status: DC

## 2020-10-23 MED ORDER — BUPROPION HCL ER (SR) 150 MG PO TB12: 150.0000 mg | ORAL_TABLET | Freq: Every morning | ORAL | 0 refills | Status: DC

## 2020-10-24 ENCOUNTER — Telehealth: Payer: Self-pay | Admitting: Internal Medicine

## 2020-10-24 ENCOUNTER — Other Ambulatory Visit: Payer: Self-pay

## 2020-10-24 MED ORDER — ACCU-CHEK SOFTCLIX LANCET DEV KIT: PACK | 6 refills | Status: DC

## 2020-10-24 MED ORDER — ACCU-CHEK SOFTCLIX LANCET DEV KIT: PACK | 6 refills | Status: AC

## 2020-10-24 NOTE — Telephone Encounter (Signed)
Copied from [facility] [id number]. Topic: General - Other >> Oct 24, 2020 12:52 PM Pawlus, Brayton Layman A wrote: Reason for CRM: [facility] called stating the pt just needed the Lancets, not Lancets Misc. (ACCU-CHEK SOFTCLIX LANCET DEV) KIT. Pt does not need the whole kit.

## 2020-10-25 MED ORDER — ACCU-CHEK SOFTCLIX LANCETS MISC: 12 refills | Status: DC

## 2020-10-25 NOTE — Telephone Encounter (Signed)
Rx sent for just the lancets.

## 2020-10-26 NOTE — Progress Notes (Signed)
Chief Complaint:   OBESITY Sarah [name] is here to discuss her progress with her obesity treatment plan along with follow-up of her obesity related diagnoses.   Today's visit was #: 77 Starting weight: 228 lbs Starting date: 02/15/2020 Today's weight: 220 lbs Today's date: 10/23/2020 Weight change since last visit: +4 lbs Total lbs lost to date: 8 lbs Body mass index is 36.61 kg/m.  Total weight loss percentage to date: -3.51%  Interim History:  Sarah [name] feels really down if she gains a little and then snacks more and feels poorly about herself, which makes her want to give up. She feels it is a vicious cycle and wants to break it.  She admits to depression today.  Current Meal Plan: the Category 2 Plan and keeping a food journal or adhering to recommended goals of 1000 calories and 90 grams of protein for 50% of the time.  Current Exercise Plan: Walking for 15-20 minutes 1 time per week.  Assessment/Plan:   Medications Discontinued During This Encounter  Medication Reason  . Vitamin D, Ergocalciferol, (DRISDOL) 1.25 MG (50000 UNIT) CAPS capsule Reorder     Meds ordered this encounter  Medications  . Vitamin D, Ergocalciferol, (DRISDOL) 1.25 MG (50000 UNIT) CAPS capsule    Sig: Take 1 capsule (50,000 Units total) by mouth every 7 (seven) days.    Dispense:  4 capsule    Refill:  0    30 d only- pt needs OV for RF  . buPROPion (WELLBUTRIN SR) 150 MG 12 hr tablet    Sig: Take 1 tablet (150 mg total) by mouth in the morning.    Dispense:  30 tablet    Refill:  0     1. Vitamin D deficiency Not at goal. Current vitamin D is 24.0, tested on 07/26/2020. Optimal goal > 50 ng/dL.  She is taking vitamin D 50,000 IU weekly.  Plan: Continue to take prescription Vitamin D @50 ,000 IU every week as prescribed.  Follow-up for routine testing of Vitamin D, at least 2-3 times per year to avoid over-replacement.  - Refill Vitamin D, Ergocalciferol, (DRISDOL) 1.25 MG (50000 UNIT) CAPS capsule;  Take 1 capsule (50,000 Units total) by mouth every 7 (seven) days.  Dispense: 4 capsule; Refill: 0  2. Type 2 diabetes mellitus with other specified complication, with long-term current use of insulin (HCC) Diabetes Mellitus: Not at goal. Medication: Trulicity 3 mg subcutaneously weekly, metformin 1,000 mg twice daily. Lantus. Issues reviewed: blood sugar goals, complications of diabetes mellitus, hypoglycemia prevention and treatment, exercise, and nutrition.  FBS 143-180s.  Management per Dr. Wynetta Emery, her PCP.  Plan: The importance of regular follow up with PCP and all other specialists as scheduled was stressed to patient today. The patient will continue to focus on protein-rich, low simple carbohydrate foods. We reviewed the importance of hydration, regular exercise for stress reduction, and restorative sleep.   Lab Results  Component Value Date   HGBA1C 5.6 06/14/2020   HGBA1C 6.9 (H) 04/17/2020   HGBA1C 9.0 (H) 01/11/2020   Lab Results  Component Value Date   MICROALBUR 80 11/13/2016   LDLCALC 87 07/10/2020   CREATININE 1.06 (H) 04/17/2020   3. Hypertension associated with diabetes ([place]) Not at goal. Medications: Norvasc 10 mg daily, Cozaar 50 mg daily, metoprolol 12.5 mg daiy.  Blood pressure at home 128/82 and right around there.  Goal medication changes per PCP and Endocrinology recently.  Plan: goal blood pressure <140/90.  Continue to check at home every  other day and if not at goal, follow-up with PCP (who primarily manages her blood pressure) sooner than planned.  Avoid buying foods that are: processed, frozen, or prepackaged to avoid excess salt. We will continue to monitor closely alongside her PCP and/or Specialist.  Regular follow up with PCP and specialists was also encouraged.   BP Readings from Last 3 Encounters:  10/23/20 (!) 147/88  10/09/20 (!) 162/88  09/04/20 138/86   Lab Results  Component Value Date   CREATININE 1.06 (H) 04/17/2020   4. Depressed mood, with  emotional eating Worsening. Medication: None.  Struggling with feeling bad about herself.  No SI, but feeling "I'm too fat, what's the purpose in eating healthy anyhow".  Plan:  Start Wellbutrin 150 mg daily.  Extensive counseling done regarding disease process, treatment options.  Risks and benefits of medication and alternative treatments as well. Behavior modification techniques were discussed today to help deal with emotional/non-hunger eating behaviors.  - Start buPROPion (WELLBUTRIN SR) 150 MG 12 hr tablet; Take 1 tablet (150 mg total) by mouth in the morning.  Dispense: 30 tablet; Refill: 0  5. Obesity, current BMI 36.6  Course: Detta is currently in the action stage of change. As such, her goal is to continue with weight loss efforts.   Nutrition goals: She has agreed to the Category 1 Plan or keeping a food journal and adhering to recommended goals of 1000 calories and 90 grams of protein.   Exercise goals: Walking for 20 minutes 5 days per week.  Behavioral modification strategies: increasing lean protein intake, decreasing simple carbohydrates, meal planning and cooking strategies and planning for success.  [name] has agreed to follow-up with our clinic in 2-3 weeks. She was informed of the importance of frequent follow-up visits to maximize her success with intensive lifestyle modifications for her multiple health conditions.   Objective:   Blood pressure (!) 147/88, pulse 75, temperature 98 F (36.7 C), height 5\' 5"  (1.651 m), weight 220 lb (99.8 kg), SpO2 100 %. Body mass index is 36.61 kg/m.  General: Cooperative, alert, well developed, in no acute distress. HEENT: Conjunctivae and lids unremarkable. Cardiovascular: Regular rhythm.  Lungs: Normal work of breathing. Neurologic: No focal deficits.   Lab Results  Component Value Date   CREATININE 1.06 (H) 04/17/2020   BUN 20 04/17/2020   NA 142 04/17/2020   K 4.2 04/17/2020   CL 104 04/17/2020   CO2 24 04/17/2020    Lab Results  Component Value Date   ALT 36 01/11/2020   AST 35 01/11/2020   ALKPHOS 53 01/11/2020   BILITOT 1.0 01/11/2020   Lab Results  Component Value Date   HGBA1C 5.6 06/14/2020   HGBA1C 6.9 (H) 04/17/2020   HGBA1C 9.0 (H) 01/11/2020   HGBA1C 9.8 (A) 09/23/2019   HGBA1C 9.3 (A) 03/22/2019   Lab Results  Component Value Date   TSH 0.938 02/15/2020   Lab Results  Component Value Date   CHOL 161 07/10/2020   HDL 44 07/10/2020   LDLCALC 87 07/10/2020   TRIG 175 (H) 07/10/2020   CHOLHDL 3.7 07/10/2020   Lab Results  Component Value Date   WBC 6.9 02/03/2020   HGB 13.3 02/03/2020   HCT 41.1 02/03/2020   MCV 92.2 02/03/2020   PLT 270 02/03/2020   Lab Results  Component Value Date   IRON 43 08/18/2010   TIBC 389 08/18/2010   FERRITIN 227 08/18/2010   Attestation Statements:   Reviewed by clinician on day of visit:  allergies, medications, problem list, medical history, surgical history, family history, social history, and previous encounter notes.  Time spent on visit including pre-visit chart review and post-visit care and charting was 40 minutes.   I, Water quality scientist, CMA, am acting as Location manager for [employer], DO.  I have reviewed the above documentation for accuracy and completeness, and I agree with the above. Marjory Sneddon, D.O.  The [place] was signed into law in 2016 which includes the topic of electronic health records.  This provides immediate access to information in MyChart.  This includes consultation notes, operative notes, office notes, lab results and pathology reports.  If you have any questions about what you read please let us know at your next visit so we can discuss your concerns and take corrective action if need be.  We are right here with you.

## 2020-10-31 ENCOUNTER — Encounter: Payer: Self-pay | Admitting: Internal Medicine

## 2020-11-01 MED ORDER — ACCU-CHEK AVIVA PLUS VI STRP: ORAL_STRIP | 12 refills | Status: DC

## 2020-11-01 MED ORDER — ACCU-CHEK SOFTCLIX LANCETS MISC: 12 refills | Status: DC

## 2020-11-02 ENCOUNTER — Other Ambulatory Visit: Payer: Self-pay | Admitting: Internal Medicine

## 2020-11-02 NOTE — Telephone Encounter (Signed)
Requested Prescriptions  Pending Prescriptions Disp Refills  . DROPLET PEN NEEDLES 29G X 12MM MISC [Pharmacy Med Name: DROPLET PEN NEEDLES 29GX12MM 29G X 12MM] 100 each 0    Sig: USE AS DIRECTED     Endocrinology: Diabetes - Testing Supplies Passed - 11/02/2020  3:54 AM      Passed - Valid encounter within last 12 months    Recent Outpatient Visits          2 months ago Type 2 diabetes mellitus with obesity ([place])   [place], MD   6 months ago Need for shingles vaccine   [place], Annie Main L, RPH-CPP   6 months ago Type 2 diabetes mellitus with obesity [facility])   [place], MD   9 months ago Need for shingles vaccine   [place], RPH-CPP   9 months ago Encounter for [employer] annual wellness exam   [facility] Wynetta Emery, Dalbert Batman, MD

## 2020-11-10 ENCOUNTER — Other Ambulatory Visit: Payer: Self-pay | Admitting: Internal Medicine

## 2020-11-10 DIAGNOSIS — I10 Essential (primary) hypertension: Secondary | ICD-10-CM

## 2020-11-11 ENCOUNTER — Other Ambulatory Visit: Payer: Self-pay | Admitting: Internal Medicine

## 2020-11-13 ENCOUNTER — Ambulatory Visit (INDEPENDENT_AMBULATORY_CARE_PROVIDER_SITE_OTHER): Payer: Medicare HMO | Admitting: Family Medicine

## 2020-11-13 ENCOUNTER — Encounter (INDEPENDENT_AMBULATORY_CARE_PROVIDER_SITE_OTHER): Payer: Self-pay

## 2020-11-14 ENCOUNTER — Encounter (INDEPENDENT_AMBULATORY_CARE_PROVIDER_SITE_OTHER): Payer: Self-pay | Admitting: Adult Health

## 2020-11-14 ENCOUNTER — Other Ambulatory Visit: Payer: Self-pay

## 2020-11-14 ENCOUNTER — Ambulatory Visit (INDEPENDENT_AMBULATORY_CARE_PROVIDER_SITE_OTHER): Payer: Medicare HMO | Admitting: Adult Health

## 2020-11-14 VITALS — BP 153/89 | HR 81 | Temp 98.2°F | Ht 65.0 in | Wt 216.0 lb

## 2020-11-14 DIAGNOSIS — J302 Other seasonal allergic rhinitis: Secondary | ICD-10-CM | POA: Diagnosis not present

## 2020-11-14 DIAGNOSIS — E559 Vitamin D deficiency, unspecified: Secondary | ICD-10-CM

## 2020-11-14 DIAGNOSIS — E1169 Type 2 diabetes mellitus with other specified complication: Secondary | ICD-10-CM

## 2020-11-14 DIAGNOSIS — Z6836 Body mass index (BMI) 36.0-36.9, adult: Secondary | ICD-10-CM

## 2020-11-14 DIAGNOSIS — R4589 Other symptoms and signs involving emotional state: Secondary | ICD-10-CM

## 2020-11-14 MED ORDER — LEVOCETIRIZINE DIHYDROCHLORIDE 5 MG PO TABS: 5.0000 mg | ORAL_TABLET | Freq: Every evening | ORAL | 0 refills | Status: DC

## 2020-11-14 MED ORDER — MONTELUKAST SODIUM 10 MG PO TABS
10.0000 mg | ORAL_TABLET | Freq: Every day | ORAL | 0 refills | Status: DC
Start: 2020-11-14 — End: 2021-03-28

## 2020-11-14 MED ORDER — VITAMIN D (ERGOCALCIFEROL) 1.25 MG (50000 UNIT) PO CAPS: 50000.0000 [IU] | ORAL_CAPSULE | ORAL | 0 refills | Status: DC

## 2020-11-14 MED ORDER — TRULICITY 3 MG/0.5ML ~~LOC~~ SOAJ: 3.0000 mg | SUBCUTANEOUS | 0 refills | Status: DC

## 2020-11-15 ENCOUNTER — Other Ambulatory Visit: Payer: Self-pay | Admitting: Internal Medicine

## 2020-11-15 DIAGNOSIS — E1169 Type 2 diabetes mellitus with other specified complication: Secondary | ICD-10-CM

## 2020-11-15 NOTE — Telephone Encounter (Signed)
Requested Prescriptions  Pending Prescriptions Disp Refills  . LANTUS SOLOSTAR 100 UNIT/ML Solostar Pen [Pharmacy Med Name: LANTUS SOLOSTAR 100 UNIT/ML Solution Pen-injector] 45 mL 1    Sig: INJECT 30 UNITS DAILY. INCREASE BY 2 UNITS EVERY 3 DAYS UNTIL MORNING SUGAR IS LESS THAN 130. MAX OF 40 UNITS.     Endocrinology:  Diabetes - Insulins Passed - 11/15/2020  3:33 AM      Passed - HBA1C is between 0 and 7.9 and within 180 days    Hemoglobin A1C  Date Value Ref Range Status  06/14/2020 5.6 4.0 - 5.6 % Final   HbA1c, POC (controlled diabetic range)  Date Value Ref Range Status  09/23/2019 9.8 (A) 0.0 - 7.0 % Final   Hgb A1c MFr Bld  Date Value Ref Range Status  04/17/2020 6.9 (H) 4.8 - 5.6 % Final    Comment:             Prediabetes: 5.7 - 6.4          Diabetes: >6.4          Glycemic control for adults with diabetes: <7.0          Passed - Valid encounter within last 6 months    Recent Outpatient Visits          2 months ago Type 2 diabetes mellitus with obesity ([place])   [place], MD   6 months ago Need for shingles vaccine   [place], Annie Main L, RPH-CPP   6 months ago Type 2 diabetes mellitus with obesity [facility])   [place], MD   9 months ago Need for shingles vaccine   [place], Jarome [place]atin, RPH-CPP   9 months ago Encounter for [employer] annual wellness exam   [facility] [place], Dalbert Batman, MD

## 2020-11-17 ENCOUNTER — Other Ambulatory Visit: Payer: Self-pay | Admitting: Internal Medicine

## 2020-11-17 DIAGNOSIS — I10 Essential (primary) hypertension: Secondary | ICD-10-CM

## 2020-11-20 NOTE — Progress Notes (Signed)
Chief Complaint:   OBESITY Sarah [name] is here to discuss her progress with her obesity treatment plan along with follow-up of her obesity related diagnoses. Sarah [name] is on the Category 2 Plan and states she is following her eating plan approximately 50% of the time. Sarah [name] states she is walking 20 minutes 3 times per week.  Today's visit was #: 18 Starting weight: 228 lbs Starting date: 02/15/2020 Today's weight: 216 lbs Today's date: 11/14/2020 Total lbs lost to date: 12 Total lbs lost since last in-office visit: 4  Interim History: Sarah [name] recently traveled to [place], [place] for 4 days. 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 She reports a decrease in cravings with recent addition of Bupropion SR.  Subjective:   1. Type 2 diabetes mellitus with other specified complication, without long-term current use of insulin (HCC) Ambulatory fasting BG 120-132.  Sarah [name] is on Metformin 5170 mg BID, Trulicity 3 mg once a week, and Lantus QD (30 units) at night.  Lab Results  Component Value Date   HGBA1C 5.6 06/14/2020   HGBA1C 6.9 (H) 04/17/2020   HGBA1C 9.0 (H) 01/11/2020   Lab Results  Component Value Date   MICROALBUR 80 11/13/2016   LDLCALC 87 07/10/2020   CREATININE 1.06 (H) 04/17/2020   No results found for: INSULIN   2. Seasonal allergies Sarah [name] reports a decline in symptoms- sneezing, itchy/watery eyes- with Singulair and Xyzal.  3. Vitamin D deficiency Sarah [name]'s Vitamin D level was 41.54 on 07/26/2020. She is currently taking prescription vitamin D 50,000 IU each week. She denies nausea, vomiting or muscle weakness.  4. Depressed mood, with emotional eating Sarah [name] reports improved mood and decreased cravings since starting Bupropion SR 150 mg QD. She reports stable mood, denies SI/HI.  Assessment/Plan:   1. Type 2 diabetes mellitus with other specified complication, without long-term current use of insulin (HCC) Good blood sugar control is important to decrease the  likelihood of diabetic complications such as nephropathy, neuropathy, limb loss, blindness, coronary artery disease, and death. Intensive lifestyle modification including diet, exercise and weight loss are the first line of treatment for diabetes.   - Dulaglutide (TRULICITY) 3 YF/7.4BS SOPN; Inject 3 mg as directed once a week.  Dispense: 2 mL; Refill: 0  2. Seasonal allergies Refill Xyzal and Singular, as prescribed below.  - montelukast (SINGULAIR) 10 MG tablet; Take 1 tablet (10 mg total) by mouth at bedtime.  Dispense: 90 tablet; Refill: 0 - levocetirizine (XYZAL) 5 MG tablet; Take 1 tablet (5 mg total) by mouth every evening.  Dispense: 90 tablet; Refill: 0  3. Vitamin D deficiency Low Vitamin D level contributes to fatigue and are associated with obesity, breast, and colon cancer. She agrees to continue to take prescription Vitamin D @50 ,000 IU every week and will follow-up for routine testing of Vitamin D, at least 2-3 times per year to avoid over-replacement.  - Vitamin D, Ergocalciferol, (DRISDOL) 1.25 MG (50000 UNIT) CAPS capsule; Take 1 capsule (50,000 Units total) by mouth every 7 (seven) days.  Dispense: 4 capsule; Refill: 0  4. Depressed mood, with emotional eating Sarah [name] is struggling with emotional eating and using food for comfort to the extent that it is negatively impacting her health. She has been working on behavior modification techniques to help reduce her emotional eating. She shows no sign of suicidal or homicidal ideations. -Continue Bupropion SR 150 mg as directed.  5. Obesity, current BMI 36.0  Sarah [name] is currently in the action stage of change. As  such, her goal is to continue with weight loss efforts. She has agreed to the Category 2 Plan.   Exercise goals:  As is  Behavioral modification strategies: increasing lean protein intake, decreasing simple carbohydrates, meal planning and cooking strategies, keeping healthy foods in the home, and planning for  success.  Sarah [name] has agreed to follow-up with our clinic in 2 weeks. She was informed of the importance of frequent follow-up visits to maximize her success with intensive lifestyle modifications for her multiple health conditions.   Objective:   Blood pressure (!) 153/89, pulse 81, temperature 98.2 F (36.8 C), height 5\' 5"  (1.651 m), weight 216 lb (98 kg), SpO2 99 %. Body mass index is 35.94 kg/m.  General: Cooperative, alert, well developed, in no acute distress. HEENT: Conjunctivae and lids unremarkable. Cardiovascular: Regular rhythm.  Lungs: Normal work of breathing. Neurologic: No focal deficits.   Lab Results  Component Value Date   CREATININE 1.06 (H) 04/17/2020   BUN 20 04/17/2020   NA 142 04/17/2020   K 4.2 04/17/2020   CL 104 04/17/2020   CO2 24 04/17/2020   Lab Results  Component Value Date   ALT 36 01/11/2020   AST 35 01/11/2020   ALKPHOS 53 01/11/2020   BILITOT 1.0 01/11/2020   Lab Results  Component Value Date   HGBA1C 5.6 06/14/2020   HGBA1C 6.9 (H) 04/17/2020   HGBA1C 9.0 (H) 01/11/2020   HGBA1C 9.8 (A) 09/23/2019   HGBA1C 9.3 (A) 03/22/2019   No results found for: INSULIN Lab Results  Component Value Date   TSH 0.938 02/15/2020   Lab Results  Component Value Date   CHOL 161 07/10/2020   HDL 44 07/10/2020   LDLCALC 87 07/10/2020   TRIG 175 (H) 07/10/2020   CHOLHDL 3.7 07/10/2020   Lab Results  Component Value Date   WBC 6.9 02/03/2020   HGB 13.3 02/03/2020   HCT 41.1 02/03/2020   MCV 92.2 02/03/2020   PLT 270 02/03/2020   Lab Results  Component Value Date   IRON 43 08/18/2010   TIBC 389 08/18/2010   FERRITIN 227 08/18/2010    Obesity Behavioral Intervention:   Approximately 15 minutes were spent on the discussion below.  ASK: We discussed the diagnosis of obesity with Sarah [name] today and Sarah [name] agreed to give [place] permission to discuss obesity behavioral modification therapy today.  ASSESS: Sarah [name] has the diagnosis of obesity  and her BMI today is 36.0. Sarah [name] is in the action stage of change.   ADVISE: Sarah [name] was educated on the multiple health risks of obesity as well as the benefit of weight loss to improve her health. She was advised of the need for long term treatment and the importance of lifestyle modifications to improve her current health and to decrease her risk of future health problems.  AGREE: Multiple dietary modification options and treatment options were discussed and Sarah [name] agreed to follow the recommendations documented in the above note.  ARRANGE: Sarah [name] was educated on the importance of frequent visits to treat obesity as outlined per CMS and USPSTF guidelines and agreed to schedule her next follow up appointment today.  Attestation Statements:   Reviewed by clinician on day of visit: allergies, medications, problem list, medical history, surgical history, family history, social history, and previous encounter notes.  Coral Ceo, CMA, am acting as transcriptionist for Mina Marble, NP.  I have reviewed the above documentation for accuracy and completeness, and I agree with the above. -  Reginae Wolfrey d. Nayali Talerico, NP-C

## 2020-11-29 ENCOUNTER — Ambulatory Visit (INDEPENDENT_AMBULATORY_CARE_PROVIDER_SITE_OTHER): Payer: Medicare HMO | Admitting: Family Medicine

## 2020-11-29 ENCOUNTER — Other Ambulatory Visit: Payer: Self-pay

## 2020-11-29 VITALS — BP 137/84 | HR 85 | Temp 98.0°F | Ht 65.0 in | Wt 218.0 lb

## 2020-11-29 DIAGNOSIS — E1169 Type 2 diabetes mellitus with other specified complication: Secondary | ICD-10-CM | POA: Diagnosis not present

## 2020-11-29 DIAGNOSIS — E538 Deficiency of other specified B group vitamins: Secondary | ICD-10-CM | POA: Diagnosis not present

## 2020-11-29 DIAGNOSIS — Z6838 Body mass index (BMI) 38.0-38.9, adult: Secondary | ICD-10-CM | POA: Diagnosis not present

## 2020-11-29 DIAGNOSIS — E559 Vitamin D deficiency, unspecified: Secondary | ICD-10-CM

## 2020-11-29 DIAGNOSIS — R4589 Other symptoms and signs involving emotional state: Secondary | ICD-10-CM

## 2020-11-29 DIAGNOSIS — E785 Hyperlipidemia, unspecified: Secondary | ICD-10-CM | POA: Diagnosis not present

## 2020-11-29 MED ORDER — BUPROPION HCL ER (SR) 150 MG PO TB12: 150.0000 mg | ORAL_TABLET | Freq: Every morning | ORAL | 0 refills | Status: DC

## 2020-11-30 ENCOUNTER — Encounter (INDEPENDENT_AMBULATORY_CARE_PROVIDER_SITE_OTHER): Payer: Self-pay | Admitting: Family Medicine

## 2020-11-30 LAB — HEMOGLOBIN A1C
Est. average glucose Bld gHb Est-mCnc: 160 mg/dL
Hgb A1c MFr Bld: 7.2 % — ABNORMAL HIGH (ref 4.8–5.6)

## 2020-11-30 LAB — COMPREHENSIVE METABOLIC PANEL
ALT: 33 IU/L — ABNORMAL HIGH (ref 0–32)
AST: 29 IU/L (ref 0–40)
Albumin/Globulin Ratio: 1.8 (ref 1.2–2.2)
Albumin: 4.5 g/dL (ref 3.8–4.9)
Alkaline Phosphatase: 71 IU/L (ref 44–121)
BUN/Creatinine Ratio: 20 (ref 9–23)
BUN: 22 mg/dL (ref 6–24)
Bilirubin Total: 0.6 mg/dL (ref 0.0–1.2)
CO2: 19 mmol/L — ABNORMAL LOW (ref 20–29)
Calcium: 11.5 mg/dL — ABNORMAL HIGH (ref 8.7–10.2)
Chloride: 103 mmol/L (ref 96–106)
Creatinine, Ser: 1.11 mg/dL — ABNORMAL HIGH (ref 0.57–1.00)
Globulin, Total: 2.5 g/dL (ref 1.5–4.5)
Glucose: 110 mg/dL — ABNORMAL HIGH (ref 65–99)
Potassium: 4.6 mmol/L (ref 3.5–5.2)
Sodium: 140 mmol/L (ref 134–144)
Total Protein: 7 g/dL (ref 6.0–8.5)
eGFR: 59 mL/min/{1.73_m2} — ABNORMAL LOW (ref >59)

## 2020-11-30 LAB — LIPID PANEL
Chol/HDL Ratio: 3.3 ratio (ref 0.0–4.4)
Cholesterol, Total: 145 mg/dL (ref 100–199)
HDL: 44 mg/dL (ref >39)
LDL Chol Calc (NIH): 60 mg/dL (ref 0–99)
Triglycerides: 261 mg/dL — ABNORMAL HIGH (ref 0–149)
VLDL Cholesterol Cal: 41 mg/dL — ABNORMAL HIGH (ref 5–40)

## 2020-11-30 LAB — VITAMIN D 25 HYDROXY (VIT D DEFICIENCY, FRACTURES): Vit D, 25-Hydroxy: 60.6 ng/mL (ref 30.0–100.0)

## 2020-11-30 LAB — MAGNESIUM: Magnesium: 1.6 mg/dL (ref 1.6–2.3)

## 2020-11-30 LAB — VITAMIN B12: Vitamin B-12: 917 pg/mL (ref 232–1245)

## 2020-11-30 NOTE — Telephone Encounter (Signed)
Please advise 

## 2020-12-04 ENCOUNTER — Other Ambulatory Visit (INDEPENDENT_AMBULATORY_CARE_PROVIDER_SITE_OTHER): Payer: Self-pay | Admitting: Adult Health

## 2020-12-04 DIAGNOSIS — E1169 Type 2 diabetes mellitus with other specified complication: Secondary | ICD-10-CM

## 2020-12-04 NOTE — Telephone Encounter (Signed)
Pt last seen by Dr. Opalski.  

## 2020-12-06 NOTE — Progress Notes (Signed)
Chief Complaint:   OBESITY Sarah [name] is here to discuss her progress with her obesity treatment plan along with follow-up of her obesity related diagnoses.   Today's visit was #: 42 Starting weight: 228 lbs Starting date: 02/15/2020 Today's weight: 218 lbs Today's date: 11/29/2020 Weight change since last visit: +2 lbs Total lbs lost to date: 10 lbs Body mass index is 36.28 kg/m.  Total weight loss percentage to date: -4.39%  Interim History: Sarah [name] is here for a follow up office visit.  We reviewed her meal plan and questions were answered.  Patient's food recall appears to be accurate and consistent with what is on plan when she is following it.   When eating on plan, her hunger and cravings are well controlled.    Sarah [name] says she has been doing some celebration eating lately.  Occasionally skips meals.  Current Meal Plan: the Category 2 Plan for 80% of the time.  Current Exercise Plan: Walking for 20 minutes 3 times per week. Current Anti-Obesity Medications: Trulicity 3 mg subcutaneously weekly. Side effects: None.  Assessment/Plan:   Orders Placed This Encounter  Procedures   Hemoglobin A1c   Lipid panel   VITAMIN D 25 Hydroxy (Vit-D Deficiency, Fractures)   Vitamin B12   Magnesium   Comprehensive metabolic panel   Medications Discontinued During This Encounter  Medication Reason   buPROPion (WELLBUTRIN SR) 150 MG 12 hr tablet Reorder   Meds ordered this encounter  Medications   buPROPion (WELLBUTRIN SR) 150 MG 12 hr tablet    Sig: Take 1 tablet (150 mg total) by mouth in the morning.    Dispense:  30 tablet    Refill:  0   1. Hyperlipidemia associated with type 2 diabetes mellitus ([place]) Course: Uncontrolled.  Lipid-lowering medications: Crestor 20 mg daily.  Elevated TH and low HDL.  Plan:  Will check FLP and CMP.  She will continue Crestor and prudent nutritional plan/weight loss and exercise.  Dietary changes: Increase soluble fiber, decrease simple  carbohydrates, decrease saturated fat. Exercise changes: Moderate to vigorous-intensity aerobic activity 150 minutes per week or as tolerated. We will continue to monitor along with PCP/specialists as it pertains to her weight loss journey.  Lab Results  Component Value Date   CHOL 145 11/29/2020   HDL 44 11/29/2020   LDLCALC 60 11/29/2020   TRIG 261 (H) 11/29/2020   CHOLHDL 3.3 11/29/2020   Lab Results  Component Value Date   ALT 33 (H) 11/29/2020   AST 29 11/29/2020   ALKPHOS 71 11/29/2020   BILITOT 0.6 11/29/2020   The 10-year ASCVD risk score Sarah Bussing DC Jr., Sarah al., Sarah [name]) is: 12.7%   Values used to calculate the score:     Age: 55 years     Sex: Female     Is Non-Hispanic African American: Yes     Diabetic: Yes     Tobacco smoker: No     Systolic Blood Pressure: 496 mmHg     Is BP treated: Yes     HDL Cholesterol: 44 mg/dL     Total Cholesterol: 145 mg/dL  - Lipid panel - Comprehensive metabolic panel  2. Type 2 diabetes mellitus with other specified complication, without long-term current use of insulin (HCC) Diabetes Mellitus: Not at goal. Medication: Trulicity 3 mg subcutaneously weekly, metformin 1,000 mg daily, Lantus. Issues reviewed: blood sugar goals, complications of diabetes mellitus, hypoglycemia prevention and treatment, exercise, and nutrition.  FBS 120 (132 highest and 106 lowest).  Plan:  Check A1c, CMP, and magnesium level today due to use of metformin. The importance of regular follow up with PCP and all other specialists as scheduled was stressed to patient today. The patient will continue to focus on protein-rich, low simple carbohydrate foods. We reviewed the importance of hydration, regular exercise for stress reduction, and restorative sleep.   Lab Results  Component Value Date   HGBA1C 7.2 (H) 11/29/2020   HGBA1C 5.6 06/14/2020   HGBA1C 6.9 (H) 04/17/2020   Lab Results  Component Value Date   MICROALBUR 80 11/13/2016   LDLCALC 60 11/29/2020    CREATININE 1.11 (H) 11/29/2020   - Hemoglobin A1c - Magnesium  3. Vitamin D deficiency At goal. She is taking vitamin D 50,000 IU weekly.  Plan: Continue to take prescription Vitamin D @50 ,000 IU every week as prescribed.  Will check vitamin D level today, as per below.  Lab Results  Component Value Date   VD25OH 60.6 11/29/2020   VD25OH 41.54 07/26/2020   VD25OH 44.6 04/17/2020   - VITAMIN D 25 Hydroxy (Vit-D Deficiency, Fractures)  4. B12 deficiency Lab Results  Component Value Date   VITAMINB12 917 11/29/2020   Supplementation: OTC vitamin B12 500 mcg daily.  No issues.  Continue current treatment and we will recheck her B12 level today.  - Vitamin B12  5. Depressed mood, with emotional eating Improved. Medication: Wellbutrin 150 mg daily.  Emotional eating much improved since starting Wellbutrin.  Controlled all day, per patient.  Plan:  Refill Wellbutrin today, as per below. Behavior modification techniques were discussed today to help deal with emotional/non-hunger eating behaviors.  - Refill buPROPion (WELLBUTRIN SR) 150 MG 12 hr tablet; Take 1 tablet (150 mg total) by mouth in the morning.  Dispense: 30 tablet; Refill: 0  6. Obesity BMI today is 36  Course: Sarah [name] is currently in the action stage of change. As such, her goal is to continue with weight loss efforts.   Nutrition goals: She has agreed to the Category 2 Plan.   Exercise goals: For substantial health benefits, adults should do at least 150 minutes (2 hours and 30 minutes) a week of moderate-intensity, or 75 minutes (1 hour and 15 minutes) a week of vigorous-intensity aerobic physical activity, or an equivalent combination of moderate- and vigorous-intensity aerobic activity. Aerobic activity should be performed in episodes of at least 10 minutes, and preferably, it should be spread throughout the week.  Behavioral modification strategies: increasing lean protein intake, no skipping meals, dealing with  family or coworker sabotage, and celebration eating strategies.  Sarah [name] has agreed to follow-up with our clinic in 2 weeks. She was informed of the importance of frequent follow-up visits to maximize her success with intensive lifestyle modifications for her multiple health conditions.   Objective:   Blood pressure 137/84, pulse 85, temperature 98 F (36.7 C), height 5\' 5"  (1.651 m), weight 218 lb (98.9 kg), SpO2 96 %. Body mass index is 36.28 kg/m.  General: Cooperative, alert, well developed, in no acute distress. HEENT: Conjunctivae and lids unremarkable. Cardiovascular: Regular rhythm.  Lungs: Normal work of breathing. Neurologic: No focal deficits.   Lab Results  Component Value Date   CREATININE 1.11 (H) 11/29/2020   BUN 22 11/29/2020   NA 140 11/29/2020   K 4.6 11/29/2020   CL 103 11/29/2020   CO2 19 (L) 11/29/2020   Lab Results  Component Value Date   ALT 33 (H) 11/29/2020   AST 29 11/29/2020   ALKPHOS 71  11/29/2020   BILITOT 0.6 11/29/2020   Lab Results  Component Value Date   HGBA1C 7.2 (H) 11/29/2020   HGBA1C 5.6 06/14/2020   HGBA1C 6.9 (H) 04/17/2020   HGBA1C 9.0 (H) 01/11/2020   HGBA1C 9.8 (A) 09/23/2019   Lab Results  Component Value Date   TSH 0.938 02/15/2020   Lab Results  Component Value Date   CHOL 145 11/29/2020   HDL 44 11/29/2020   LDLCALC 60 11/29/2020   TRIG 261 (H) 11/29/2020   CHOLHDL 3.3 11/29/2020   Lab Results  Component Value Date   VD25OH 60.6 11/29/2020   VD25OH 41.54 07/26/2020   VD25OH 44.6 04/17/2020   Lab Results  Component Value Date   WBC 6.9 02/03/2020   HGB 13.3 02/03/2020   HCT 41.1 02/03/2020   MCV 92.2 02/03/2020   PLT 270 02/03/2020   Lab Results  Component Value Date   IRON 43 08/18/2010   TIBC 389 08/18/2010   FERRITIN 227 08/18/2010   Obesity Behavioral Intervention:   Approximately 15 minutes were spent on the discussion below.  ASK: We discussed the diagnosis of obesity with Sarah [name] today and  Sarah [name] agreed to give [place] permission to discuss obesity behavioral modification therapy today.  ASSESS: Sarah [name] has the diagnosis of obesity and her BMI today is 36.3. Sarah [name] is in the action stage of change.   ADVISE: Sarah [name] was educated on the multiple health risks of obesity as well as the benefit of weight loss to improve her health. She was advised of the need for long term treatment and the importance of lifestyle modifications to improve her current health and to decrease her risk of future health problems.  AGREE: Multiple dietary modification options and treatment options were discussed and Sarah [name] agreed to follow the recommendations documented in the above note.  ARRANGE: Sarah [name] was educated on the importance of frequent visits to treat obesity as outlined per CMS and USPSTF guidelines and agreed to schedule her next follow up appointment today.  Attestation Statements:   Reviewed by clinician on day of visit: allergies, medications, problem list, medical history, surgical history, family history, social history, and previous encounter notes.  I, Water quality scientist, CMA, am acting as Location manager for [employer], DO.  I have reviewed the above documentation for accuracy and completeness, and I agree with the above. Marjory Sneddon, D.O.  The [place] was signed into law in 2016 which includes the topic of electronic health records.  This provides immediate access to information in MyChart.  This includes consultation notes, operative notes, office notes, lab results and pathology reports.  If you have any questions about what you read please let us know at your next visit so we can discuss your concerns and take corrective action if need be.  We are right here with you.

## 2020-12-13 ENCOUNTER — Other Ambulatory Visit: Payer: Self-pay

## 2020-12-13 ENCOUNTER — Ambulatory Visit (INDEPENDENT_AMBULATORY_CARE_PROVIDER_SITE_OTHER): Payer: Medicare HMO | Admitting: Family Medicine

## 2020-12-13 VITALS — BP 143/88 | HR 77 | Temp 97.8°F | Ht 65.0 in | Wt 219.0 lb

## 2020-12-13 DIAGNOSIS — E7849 Other hyperlipidemia: Secondary | ICD-10-CM | POA: Diagnosis not present

## 2020-12-13 DIAGNOSIS — E559 Vitamin D deficiency, unspecified: Secondary | ICD-10-CM | POA: Diagnosis not present

## 2020-12-13 DIAGNOSIS — R4589 Other symptoms and signs involving emotional state: Secondary | ICD-10-CM | POA: Diagnosis not present

## 2020-12-13 DIAGNOSIS — E1169 Type 2 diabetes mellitus with other specified complication: Secondary | ICD-10-CM | POA: Diagnosis not present

## 2020-12-13 DIAGNOSIS — Z6836 Body mass index (BMI) 36.0-36.9, adult: Secondary | ICD-10-CM | POA: Diagnosis not present

## 2020-12-13 MED ORDER — BUPROPION HCL ER (SR) 150 MG PO TB12: 150.0000 mg | ORAL_TABLET | Freq: Every morning | ORAL | 0 refills | Status: DC

## 2020-12-13 MED ORDER — SEMAGLUTIDE (2 MG/DOSE) 8 MG/3ML ~~LOC~~ SOPN: PEN_INJECTOR | SUBCUTANEOUS | 0 refills | Status: DC

## 2020-12-21 NOTE — Progress Notes (Signed)
Chief Complaint:   OBESITY Sarah [name] is here to discuss her progress with her obesity treatment plan along with follow-up of her obesity related diagnoses.   Today's visit was #: 20 Starting weight: 228 lbs Starting date: 02/15/2020 Today's weight: 219 lbs Today's date: 12/13/2020 Weight change since last visit: +1 lb Total lbs lost to date: 9 lbs Body mass index is 36.44 kg/m.  Total weight loss percentage to date: -3.95%  Interim History:  Sarah [name] started walking at the track since her last office visit.  Still skipping meals, lunch occasionally, but much better.  She went to a couple of cookouts over the weekend.  She is here to go over her labs.  Plan:  We discussed [place] sugar-free, low calorie BBQ sauce as well as others.  Current Meal Plan: the Category 2 Plan for 90% of the time.   Current Exercise Plan: Walking for 30 minutes 2 times per week.  Current Anti-Obesity Medications: Trulicity 3 mg subcutaneously weekly. Side effects: None.   Assessment/Plan:   Medications Discontinued During This Encounter  Medication Reason   buPROPion (WELLBUTRIN SR) 150 MG 12 hr tablet Reorder   Meds ordered this encounter  Medications   buPROPion (WELLBUTRIN SR) 150 MG 12 hr tablet    Sig: Take 1 tablet (150 mg total) by mouth in the morning.    Dispense:  30 tablet    Refill:  0   Semaglutide, 2 MG/DOSE, 8 MG/3ML SOPN    Sig: 2mg  [place] q wk    Dispense:  3 mL    Refill:  0   1. Type 2 diabetes mellitus with other specified complication, without long-term current use of insulin (HCC) Diabetes Mellitus: Not at goal. Medication: Trulicity 3 mg subcutaneously weekly, Lantus, metformin 1,000 mg twice daily. Issues reviewed: blood sugar goals, complications of diabetes mellitus, hypoglycemia prevention and treatment, exercise, and nutrition.  FBS 139, 122, 107.  She is here to review labs.  A1c elevated from prior.  Plan:  Discussed labs with patient today.  Change from 3 mg  Trulicity to 2 mg Ozempic.  Pt understands that 3 mg trulicity is equipotent to 1mg  ozempic, thus we are increasing her dose.   I counseled her and she understands the risks and benefits of the medication and that she may potentially experience more side effects.  The importance of regular follow up with PCP and all other specialists as scheduled was stressed to patient today. The patient will continue to focus on protein-rich, low simple carbohydrate foods. We reviewed the importance of hydration, regular exercise for stress reduction, and restorative sleep.   Lab Results  Component Value Date   HGBA1C 7.2 (H) 11/29/2020   HGBA1C 5.6 06/14/2020   HGBA1C 6.9 (H) 04/17/2020   Lab Results  Component Value Date   MICROALBUR 80 11/13/2016   LDLCALC 60 11/29/2020   CREATININE 1.11 (H) 11/29/2020   2. Vitamin D deficiency At goal.  She is taking vitamin D 50,000 IU weekly, per Dr. Wynetta Emery..  Plan:  Discussed labs with patient today.  Continue to take prescription Vitamin D @50 ,000 IU every week as prescribed.  Follow-up for routine testing of Vitamin D, at least 2-3 times per year to avoid over-replacement.  Lab Results  Component Value Date   VD25OH 60.6 11/29/2020   VD25OH 41.54 07/26/2020   VD25OH 44.6 04/17/2020   3. Other hyperlipidemia Course: Not at goal. Lipid-lowering medications: Crestor 20 mg daily.  Elevated TG from prior.  Plan:  Discussed labs with patient today.  Continue Crestor.  LDL at goal.  Improved blood sugar control and prudent nutritional plan to help with TG and increase exercise.  Dietary changes: Increase soluble fiber, decrease simple carbohydrates, decrease saturated fat. Exercise changes: Moderate to vigorous-intensity aerobic activity 150 minutes per week or as tolerated. We will continue to monitor along with PCP/specialists as it pertains to her weight loss journey.  Lab Results  Component Value Date   CHOL 145 11/29/2020   HDL 44 11/29/2020   LDLCALC 60  11/29/2020   TRIG 261 (H) 11/29/2020   CHOLHDL 3.3 11/29/2020   Lab Results  Component Value Date   ALT 33 (H) 11/29/2020   AST 29 11/29/2020   ALKPHOS 71 11/29/2020   BILITOT 0.6 11/29/2020   The 10-year ASCVD risk score Mikey Bussing DC Jr., et al., 2013) is: 15.2%   Values used to calculate the score:     Age: 55 years     Sex: Female     Is Non-Hispanic African American: Yes     Diabetic: Yes     Tobacco smoker: No     Systolic Blood Pressure: 299 mmHg     Is BP treated: Yes     HDL Cholesterol: 44 mg/dL     Total Cholesterol: 145 mg/dL  4. Depressed mood, with emotional eating Controlled. Medication: Wellbutrin 150 mg daily.  Plan:  Discussed labs with patient today.  Refill Wellbutrin.  Stable.  No change. Behavior modification techniques were discussed today to help deal with emotional/non-hunger eating behaviors.  - Refill buPROPion (WELLBUTRIN SR) 150 MG 12 hr tablet; Take 1 tablet (150 mg total) by mouth in the morning.  Dispense: 30 tablet; Refill: 0  5. Class 2 severe obesity with serious comorbidity and body mass index (BMI) of 36.0 to 36.9 in adult, unspecified obesity type [facility])  Course: Sarah [name] is currently in the action stage of change. As such, her goal is to continue with weight loss efforts.   Nutrition goals: She has agreed to CHANGE from CAT 2 to the Category 1 Plan with breakfast options.   Exercise goals:  As is.  Behavioral modification strategies: increasing lean protein intake, decreasing simple carbohydrates, and planning for success.  Sarah [name] has agreed to follow-up with our clinic in 2 weeks. She was informed of the importance of frequent follow-up visits to maximize her success with intensive lifestyle modifications for her multiple health conditions.   Objective:   Blood pressure (!) 143/88, pulse 77, temperature 97.8 F (36.6 C), height 5\' 5"  (1.651 m), weight 219 lb (99.3 kg), SpO2 94 %. Body mass index is 36.44 kg/m.  General: Cooperative,  alert, well developed, in no acute distress. HEENT: Conjunctivae and lids unremarkable. Cardiovascular: Regular rhythm.  Lungs: Normal work of breathing. Neurologic: No focal deficits.   Lab Results  Component Value Date   CREATININE 1.11 (H) 11/29/2020   BUN 22 11/29/2020   NA 140 11/29/2020   K 4.6 11/29/2020   CL 103 11/29/2020   CO2 19 (L) 11/29/2020   Lab Results  Component Value Date   ALT 33 (H) 11/29/2020   AST 29 11/29/2020   ALKPHOS 71 11/29/2020   BILITOT 0.6 11/29/2020   Lab Results  Component Value Date   HGBA1C 7.2 (H) 11/29/2020   HGBA1C 5.6 06/14/2020   HGBA1C 6.9 (H) 04/17/2020   HGBA1C 9.0 (H) 01/11/2020   HGBA1C 9.8 (A) 09/23/2019   Lab Results  Component Value Date   TSH 0.938 02/15/2020  Lab Results  Component Value Date   CHOL 145 11/29/2020   HDL 44 11/29/2020   LDLCALC 60 11/29/2020   TRIG 261 (H) 11/29/2020   CHOLHDL 3.3 11/29/2020   Lab Results  Component Value Date   VD25OH 60.6 11/29/2020   VD25OH 41.54 07/26/2020   VD25OH 44.6 04/17/2020   Lab Results  Component Value Date   WBC 6.9 02/03/2020   HGB 13.3 02/03/2020   HCT 41.1 02/03/2020   MCV 92.2 02/03/2020   PLT 270 02/03/2020   Lab Results  Component Value Date   IRON 43 08/18/2010   TIBC 389 08/18/2010   FERRITIN 227 08/18/2010   Attestation Statements:   Reviewed by clinician on day of visit: allergies, medications, problem list, medical history, surgical history, family history, social history, and previous encounter notes.  Time spent on visit including pre-visit chart review and post-visit care and charting was 42 minutes.   I, Water quality scientist, CMA, am acting as Location manager for [employer], DO.  I have reviewed the above documentation for accuracy and completeness, and I agree with the above. Marjory Sneddon, D.O.  The [place] was signed into law in 2016 which includes the topic of electronic health records.  This provides immediate  access to information in MyChart.  This includes consultation notes, operative notes, office notes, lab results and pathology reports.  If you have any questions about what you read please let us know at your next visit so we can discuss your concerns and take corrective action if need be.  We are right here with you.

## 2020-12-27 ENCOUNTER — Ambulatory Visit (INDEPENDENT_AMBULATORY_CARE_PROVIDER_SITE_OTHER): Payer: Medicare HMO | Admitting: Family Medicine

## 2020-12-27 ENCOUNTER — Encounter (INDEPENDENT_AMBULATORY_CARE_PROVIDER_SITE_OTHER): Payer: Self-pay | Admitting: Family Medicine

## 2020-12-27 ENCOUNTER — Other Ambulatory Visit: Payer: Self-pay

## 2020-12-27 VITALS — BP 129/82 | HR 78 | Temp 98.0°F | Ht 65.0 in | Wt 218.0 lb

## 2020-12-27 DIAGNOSIS — E1169 Type 2 diabetes mellitus with other specified complication: Secondary | ICD-10-CM | POA: Diagnosis not present

## 2020-12-27 DIAGNOSIS — Z6837 Body mass index (BMI) 37.0-37.9, adult: Secondary | ICD-10-CM

## 2020-12-27 DIAGNOSIS — E559 Vitamin D deficiency, unspecified: Secondary | ICD-10-CM | POA: Diagnosis not present

## 2020-12-27 DIAGNOSIS — R4589 Other symptoms and signs involving emotional state: Secondary | ICD-10-CM

## 2020-12-27 MED ORDER — SEMAGLUTIDE (2 MG/DOSE) 8 MG/3ML ~~LOC~~ SOPN: PEN_INJECTOR | SUBCUTANEOUS | 0 refills | Status: DC

## 2020-12-27 MED ORDER — VITAMIN D (ERGOCALCIFEROL) 1.25 MG (50000 UNIT) PO CAPS: 50000.0000 [IU] | ORAL_CAPSULE | ORAL | 0 refills | Status: DC

## 2020-12-27 MED ORDER — BUPROPION HCL ER (SR) 150 MG PO TB12: 150.0000 mg | ORAL_TABLET | Freq: Every morning | ORAL | 0 refills | Status: DC

## 2021-01-03 ENCOUNTER — Encounter (INDEPENDENT_AMBULATORY_CARE_PROVIDER_SITE_OTHER): Payer: Self-pay | Admitting: Family Medicine

## 2021-01-03 NOTE — Telephone Encounter (Signed)
Dr.Opalski ?

## 2021-01-04 NOTE — Progress Notes (Signed)
Chief Complaint:   OBESITY Sarah [name] is here to discuss her progress with her obesity treatment plan along with follow-up of her obesity related diagnoses.   Today's visit was #: 21 Starting weight: 228 lbs Starting date: 02/15/2020 Today's weight: 218 lbs Today's date: 12/27/2020 Weight change since last visit: 1 lb Total lbs lost to date: 10 lbs Body mass index is 36.28 kg/m.  Total weight loss percentage to date: -4.39%  Interim History:  Chemeka Yarbough is here for a follow up office visit.  We reviewed her meal plan and questions were answered.  Patient's food recall appears to be accurate and consistent with what is on plan when she is following it.   When eating on plan, her hunger and cravings are well controlled.    Saleta had a birthday since her last visit.  Plan:  Focus on mindful self care.  Also, she will put medications and meals as more of a priority in her daily schedule.  Recheck IC at next office visit or the following one.  Current Meal Plan: the Category 1 Plan with breakfast options for 60% of the time.  Current Exercise Plan: None. Current Anti-Obesity Medications: Ozempic 2 mg subcutaneously weekly. Side effects: None.  Assessment/Plan:   Medications Discontinued During This Encounter  Medication Reason   Dulaglutide (TRULICITY) 3 0000000 SOPN Error   Vitamin D, Ergocalciferol, (DRISDOL) 1.25 MG (50000 UNIT) CAPS capsule Reorder   buPROPion (WELLBUTRIN SR) 150 MG 12 hr tablet Reorder   Semaglutide, 2 MG/DOSE, 8 MG/3ML SOPN Reorder    Meds ordered this encounter  Medications   buPROPion (WELLBUTRIN SR) 150 MG 12 hr tablet    Sig: Take 1 tablet (150 mg total) by mouth in the morning.    Dispense:  30 tablet    Refill:  0   Semaglutide, 2 MG/DOSE, 8 MG/3ML SOPN    Sig: '2mg'$  [place] q wk    Dispense:  3 mL    Refill:  0   Vitamin D, Ergocalciferol, (DRISDOL) 1.25 MG (50000 UNIT) CAPS capsule    Sig: Take 1 capsule (50,000 Units total) by mouth every 7  (seven) days.    Dispense:  4 capsule    Refill:  0    30 d only- pt needs OV for RF    1. Type 2 diabetes mellitus with other specified complication, without long-term current use of insulin (HCC) Diabetes Mellitus: Improving, but not optimized. Medication: Ozempic 2 mg subcutaneously weekly, metformin 1,000 mg twice daily, Lantus. Issues reviewed: blood sugar goals, complications of diabetes mellitus, hypoglycemia prevention and treatment, exercise, and nutrition.   Plan:  Refill Ozempic today. The importance of regular follow up with PCP and all other specialists as scheduled was stressed to patient today. The patient will continue to focus on protein-rich, low simple carbohydrate foods. We reviewed the importance of hydration, regular exercise for stress reduction, and restorative sleep.   Lab Results  Component Value Date   HGBA1C 7.2 (H) 11/29/2020   HGBA1C 5.6 06/14/2020   HGBA1C 6.9 (H) 04/17/2020   Lab Results  Component Value Date   MICROALBUR 80 11/13/2016   LDLCALC 60 11/29/2020   CREATININE 1.11 (H) 11/29/2020   - Refill Semaglutide, 2 MG/DOSE, 8 MG/3ML SOPN; '2mg'$  [place] q wk  Dispense: 3 mL; Refill: 0  2. Vitamin D deficiency At goal.  She is taking vitamin D 50,000 IU weekly.  Plan: Continue to take prescription Vitamin D '@50'$ ,000 IU every week as prescribed.  Follow-up for routine testing of Vitamin D, at least 2-3 times per year to avoid over-replacement.  Lab Results  Component Value Date   VD25OH 60.6 11/29/2020   VD25OH 41.54 07/26/2020   VD25OH 44.6 04/17/2020   - Refill Vitamin D, Ergocalciferol, (DRISDOL) 1.25 MG (50000 UNIT) CAPS capsule; Take 1 capsule (50,000 Units total) by mouth every 7 (seven) days.  Dispense: 4 capsule; Refill: 0  3. Depressed mood, with emotional eating Controlled. Medication: Wellbutrin 150 mg daily.  Feeling "really well".  Mood has been much better.  Plan:  Behavior modification techniques were discussed today to help deal with  emotional/non-hunger eating behaviors.  - Refill buPROPion (WELLBUTRIN SR) 150 MG 12 hr tablet; Take 1 tablet (150 mg total) by mouth in the morning.  Dispense: 30 tablet; Refill: 0  4. Obesity with current BMI of 36.4  Course: Deshandra is currently in the action stage of change. As such, her goal is to continue with weight loss efforts.   Nutrition goals: She has agreed to the Category 1 Plan.   Exercise goals: All adults should avoid inactivity. Some physical activity is better than none, and adults who participate in any amount of physical activity gain some health benefits.  Start walking!  Behavioral modification strategies: meal planning and cooking strategies and planning for success.  Siana has agreed to follow-up with our clinic in 3 weeks. She was informed of the importance of frequent follow-up visits to maximize her success with intensive lifestyle modifications for her multiple health conditions.   Objective:   Blood pressure 129/82, pulse 78, temperature 98 F (36.7 C), height '5\' 5"'$  (1.651 m), weight 218 lb (98.9 kg), SpO2 96 %. Body mass index is 36.28 kg/m.  General: Cooperative, alert, well developed, in no acute distress. HEENT: Conjunctivae and lids unremarkable. Cardiovascular: Regular rhythm.  Lungs: Normal work of breathing. Neurologic: No focal deficits.   Lab Results  Component Value Date   CREATININE 1.11 (H) 11/29/2020   BUN 22 11/29/2020   NA 140 11/29/2020   K 4.6 11/29/2020   CL 103 11/29/2020   CO2 19 (L) 11/29/2020   Lab Results  Component Value Date   ALT 33 (H) 11/29/2020   AST 29 11/29/2020   ALKPHOS 71 11/29/2020   BILITOT 0.6 11/29/2020   Lab Results  Component Value Date   HGBA1C 7.2 (H) 11/29/2020   HGBA1C 5.6 06/14/2020   HGBA1C 6.9 (H) 04/17/2020   HGBA1C 9.0 (H) 01/11/2020   HGBA1C 9.8 (A) 09/23/2019   Lab Results  Component Value Date   TSH 0.938 02/15/2020   Lab Results  Component Value Date   CHOL 145 11/29/2020    HDL 44 11/29/2020   LDLCALC 60 11/29/2020   TRIG 261 (H) 11/29/2020   CHOLHDL 3.3 11/29/2020   Lab Results  Component Value Date   VD25OH 60.6 11/29/2020   VD25OH 41.54 07/26/2020   VD25OH 44.6 04/17/2020   Lab Results  Component Value Date   WBC 6.9 02/03/2020   HGB 13.3 02/03/2020   HCT 41.1 02/03/2020   MCV 92.2 02/03/2020   PLT 270 02/03/2020   Lab Results  Component Value Date   IRON 43 08/18/2010   TIBC 389 08/18/2010   FERRITIN 227 08/18/2010   Obesity Behavioral Intervention:   Approximately 15 minutes were spent on the discussion below.  ASK: We discussed the diagnosis of obesity with [name] today and Micayla agreed to give [place] permission to discuss obesity behavioral modification therapy today.  ASSESS: [name]  has the diagnosis of obesity and her BMI today is 36.4. Denita is in the action stage of change.   ADVISE: Rajean was educated on the multiple health risks of obesity as well as the benefit of weight loss to improve her health. She was advised of the need for long term treatment and the importance of lifestyle modifications to improve her current health and to decrease her risk of future health problems.  AGREE: Multiple dietary modification options and treatment options were discussed and Eirene agreed to follow the recommendations documented in the above note.  ARRANGE: Levon was educated on the importance of frequent visits to treat obesity as outlined per CMS and USPSTF guidelines and agreed to schedule her next follow up appointment today.  Attestation Statements:   Reviewed by clinician on day of visit: allergies, medications, problem list, medical history, surgical history, family history, social history, and previous encounter notes.  I, Water quality scientist, CMA, am acting as Location manager for [employer], DO.  I have reviewed the above documentation for accuracy and completeness, and I agree with the above. Marjory Sneddon, D.O.  The  [place] was signed into law in 2016 which includes the topic of electronic health records.  This provides immediate access to information in MyChart.  This includes consultation notes, operative notes, office notes, lab results and pathology reports.  If you have any questions about what you read please let us know at your next visit so we can discuss your concerns and take corrective action if need be.  We are right here with you.

## 2021-01-10 ENCOUNTER — Other Ambulatory Visit: Payer: Self-pay

## 2021-01-10 ENCOUNTER — Encounter (INDEPENDENT_AMBULATORY_CARE_PROVIDER_SITE_OTHER): Payer: Self-pay | Admitting: Family Medicine

## 2021-01-10 ENCOUNTER — Ambulatory Visit (INDEPENDENT_AMBULATORY_CARE_PROVIDER_SITE_OTHER): Payer: Medicare HMO | Admitting: Family Medicine

## 2021-01-10 VITALS — BP 131/84 | HR 74 | Temp 97.8°F | Ht 65.0 in | Wt 218.0 lb

## 2021-01-10 DIAGNOSIS — E1169 Type 2 diabetes mellitus with other specified complication: Secondary | ICD-10-CM

## 2021-01-10 DIAGNOSIS — Z6837 Body mass index (BMI) 37.0-37.9, adult: Secondary | ICD-10-CM

## 2021-01-10 DIAGNOSIS — Z794 Long term (current) use of insulin: Secondary | ICD-10-CM | POA: Diagnosis not present

## 2021-01-10 DIAGNOSIS — E559 Vitamin D deficiency, unspecified: Secondary | ICD-10-CM

## 2021-01-10 DIAGNOSIS — R4589 Other symptoms and signs involving emotional state: Secondary | ICD-10-CM | POA: Diagnosis not present

## 2021-01-10 MED ORDER — SEMAGLUTIDE (2 MG/DOSE) 8 MG/3ML ~~LOC~~ SOPN: PEN_INJECTOR | SUBCUTANEOUS | 0 refills | Status: DC

## 2021-01-10 MED ORDER — BUPROPION HCL ER (SR) 150 MG PO TB12: 150.0000 mg | ORAL_TABLET | Freq: Every morning | ORAL | 0 refills | Status: DC

## 2021-01-10 MED ORDER — VITAMIN D (ERGOCALCIFEROL) 1.25 MG (50000 UNIT) PO CAPS: 50000.0000 [IU] | ORAL_CAPSULE | ORAL | 0 refills | Status: DC

## 2021-01-11 ENCOUNTER — Encounter: Payer: Self-pay | Admitting: Internal Medicine

## 2021-01-11 NOTE — Progress Notes (Signed)
Chief Complaint:   OBESITY Sarah [name] is here to discuss her progress with her obesity treatment plan along with follow-up of her obesity related diagnoses. Sarah [name] is on the Category 1 Plan and states she is following her eating plan approximately 80% of the time. Sarah [name] states she is not currently exercising.  Today's visit was #: 22 Starting weight: 228 lbs Starting date: 02/15/2020 Today's weight: 218 lbs Today's date: 01/10/2021 Total lbs lost to date: 10 Total lbs lost since last in-office visit: 0  Interim History: Sarah [name] joined the gym but hasn't started working out yet. She skips lunch a lot of the time and even occasionally breakfast also  Subjective:   1. Type 2 diabetes mellitus with other specified complication, with long-term current use of insulin (HCC) Sarah [name] is taking Semaglutide and tolerating it well with no side effects.  Lab Results  Component Value Date   HGBA1C 7.2 (H) 11/29/2020   HGBA1C 5.6 06/14/2020   HGBA1C 6.9 (H) 04/17/2020   Lab Results  Component Value Date   MICROALBUR 80 11/13/2016   LDLCALC 60 11/29/2020   CREATININE 1.11 (H) 11/29/2020   No results found for: INSULIN  2. Vitamin D deficiency She is currently taking prescription vitamin D 50,000 IU each week. She denies nausea, vomiting or muscle weakness.  Lab Results  Component Value Date   VD25OH 60.6 11/29/2020   VD25OH 41.54 07/26/2020   VD25OH 44.6 04/17/2020   3. Depressed mood, with emotional eating Sarah [name] has been feeling down lately.  Assessment/Plan:   1. Type 2 diabetes mellitus with other specified complication, with long-term current use of insulin (HCC) Good blood sugar control is important to decrease the likelihood of diabetic complications such as nephropathy, neuropathy, limb loss, blindness, coronary artery disease, and death. Intensive lifestyle modification including diet, exercise and weight loss are the first line of treatment for diabetes.   Refill-  Semaglutide, 2 MG/DOSE, 8 MG/3ML SOPN; '2mg'$  [place] q wk  Dispense: 3 mL; Refill: 0  2. Vitamin D deficiency Low Vitamin D level contributes to fatigue and are associated with obesity, breast, and colon cancer. She agrees to continue to take prescription Vitamin D 50,000 IU every week and will follow-up for routine testing of Vitamin D, at least 2-3 times per year to avoid over-replacement.  Refill- Vitamin D, Ergocalciferol, (DRISDOL) 1.25 MG (50000 UNIT) CAPS capsule; Take 1 capsule (50,000 Units total) by mouth every 7 (seven) days.  Dispense: 4 capsule; Refill: 0  3. Depressed mood, with emotional eating Focus on lifestyle modifications, healthy eating, exercise, sleep, prayer/meditation, etc.  Refill- buPROPion (WELLBUTRIN SR) 150 MG 12 hr tablet; Take 1 tablet (150 mg total) by mouth in the morning.  Dispense: 30 tablet; Refill: 0  4. Obesity with current BMI of 36.3  Sarah [name] is currently in the action stage of change. As such, her goal is to continue with weight loss efforts. She has agreed to the Category 1 Plan.   Exercise goals: All adults should avoid inactivity. Some physical activity is better than none, and adults who participate in any amount of physical activity gain some health benefits. Start 3 days a week.  Discussed strategies on not skipping meals.  Behavioral modification strategies: increasing lean protein intake and no skipping meals.  Sarah [name] has agreed to follow-up with our clinic in 2-3 weeks- consider IC and rechecking fasting blood work mid September. She was informed of the importance of frequent follow-up visits to maximize her success with intensive lifestyle modifications for  her multiple health conditions.   Objective:   Blood pressure 131/84, pulse 74, temperature 97.8 F (36.6 C), height '5\' 5"'$  (1.651 m), weight 218 lb (98.9 kg), SpO2 93 %. Body mass index is 36.28 kg/m.  General: Cooperative, alert, well developed, in no acute distress. HEENT: Conjunctivae  and lids unremarkable. Cardiovascular: Regular rhythm.  Lungs: Normal work of breathing. Neurologic: No focal deficits.   Lab Results  Component Value Date   CREATININE 1.11 (H) 11/29/2020   BUN 22 11/29/2020   NA 140 11/29/2020   K 4.6 11/29/2020   CL 103 11/29/2020   CO2 19 (L) 11/29/2020   Lab Results  Component Value Date   ALT 33 (H) 11/29/2020   AST 29 11/29/2020   ALKPHOS 71 11/29/2020   BILITOT 0.6 11/29/2020   Lab Results  Component Value Date   HGBA1C 7.2 (H) 11/29/2020   HGBA1C 5.6 06/14/2020   HGBA1C 6.9 (H) 04/17/2020   HGBA1C 9.0 (H) 01/11/2020   HGBA1C 9.8 (A) 09/23/2019   No results found for: INSULIN Lab Results  Component Value Date   TSH 0.938 02/15/2020   Lab Results  Component Value Date   CHOL 145 11/29/2020   HDL 44 11/29/2020   LDLCALC 60 11/29/2020   TRIG 261 (H) 11/29/2020   CHOLHDL 3.3 11/29/2020   Lab Results  Component Value Date   VD25OH 60.6 11/29/2020   VD25OH 41.54 07/26/2020   VD25OH 44.6 04/17/2020   Lab Results  Component Value Date   WBC 6.9 02/03/2020   HGB 13.3 02/03/2020   HCT 41.1 02/03/2020   MCV 92.2 02/03/2020   PLT 270 02/03/2020   Lab Results  Component Value Date   IRON 43 08/18/2010   TIBC 389 08/18/2010   FERRITIN 227 08/18/2010    Obesity Behavioral Intervention:   Approximately 15 minutes were spent on the discussion below.  ASK: We discussed the diagnosis of obesity with Sarah [name] today and Sarah [name] agreed to give [place] permission to discuss obesity behavioral modification therapy today.  ASSESS: Sarah [name] has the diagnosis of obesity and her BMI today is 36.3. Sarah [name] is in the action stage of change.   ADVISE: Sarah [name] was educated on the multiple health risks of obesity as well as the benefit of weight loss to improve her health. She was advised of the need for long term treatment and the importance of lifestyle modifications to improve her current health and to decrease her risk of future health  problems.  AGREE: Multiple dietary modification options and treatment options were discussed and Sarah [name] agreed to follow the recommendations documented in the above note.  ARRANGE: Sarah [name] was educated on the importance of frequent visits to treat obesity as outlined per CMS and USPSTF guidelines and agreed to schedule her next follow up appointment today.  Attestation Statements:   Reviewed by clinician on day of visit: allergies, medications, problem list, medical history, surgical history, family history, social history, and previous encounter notes.  Coral Ceo, CMA, am acting as transcriptionist for [employer], DO.  I have reviewed the above documentation for accuracy and completeness, and I agree with the above. Marjory Sneddon, D.O.  The [place] was signed into law in 2016 which includes the topic of electronic health records.  This provides immediate access to information in MyChart.  This includes consultation notes, operative notes, office notes, lab results and pathology reports.  If you have any questions about what you read please let us know at  your next visit so we can discuss your concerns and take corrective action if need be.  We are right here with you.

## 2021-01-12 ENCOUNTER — Other Ambulatory Visit: Payer: Self-pay

## 2021-01-12 ENCOUNTER — Ambulatory Visit: Payer: Medicare HMO | Attending: Nurse Practitioner | Admitting: Nurse Practitioner

## 2021-01-12 VITALS — BP 138/82 | HR 80 | Temp 97.9°F

## 2021-01-12 DIAGNOSIS — L02212 Cutaneous abscess of back [any part, except buttock]: Secondary | ICD-10-CM

## 2021-01-12 MED ORDER — CEPHALEXIN 500 MG PO CAPS: 500.0000 mg | ORAL_CAPSULE | Freq: Three times a day (TID) | ORAL | 0 refills | Status: AC

## 2021-01-12 NOTE — Patient Instructions (Addendum)
Access to back:  Warm wet compresses 3 times per day  Will place referral to general surgery  Wash area with dial antibacterial soap, rinse, and pat dry   Follow up:  Follow up if needed  Skin Abscess  A skin abscess is an infected area on or under your skin that contains a collection of pus and other material. An abscess may also be called a furuncle,carbuncle, or boil. An abscess can occur in or on almost any part of your body. Some abscesses break open (rupture) on their own. Most continue to get worse unless they are treated. The infection can spread deeper into the body and eventually into your blood, whichcan make you feel ill. Treatment usually involves draining the abscess. What are the causes? An abscess occurs when germs, like bacteria, pass through your skin and cause an infection. This may be caused by: A scrape or cut on your skin. A puncture wound through your skin, including a needle injection or insect bite. Blocked oil or sweat glands. Blocked and infected hair follicles. A cyst that forms beneath your skin (sebaceous cyst) and becomes infected. What increases the risk? This condition is more likely to develop in people who: Have a weak body defense system (immune system). Have diabetes. Have dry and irritated skin. Get frequent injections or use illegal IV drugs. Have a foreign body in a wound, such as a splinter. Have problems with their lymph system or veins. What are the signs or symptoms? Symptoms of this condition include: A painful, firm bump under the skin. A bump with pus at the top. This may break through the skin and drain. Other symptoms include: Redness surrounding the abscess site. Warmth. Swelling of the lymph nodes (glands) near the abscess. Tenderness. A sore on the skin. How is this diagnosed? This condition may be diagnosed based on: A physical exam. Your medical history. A sample of pus. This may be used to find out what is causing  the infection. Blood tests. Imaging tests, such as an ultrasound, CT scan, or MRI. How is this treated? A small abscess that drains on its own may not need treatment. Treatment for larger abscesses may include: Moist heat or heat pack applied to the area several times a day. A procedure to drain the abscess (incision and drainage). Antibiotic medicines. For a severe abscess, you may first get antibiotics through an IV and then change to antibiotics by mouth. Follow these instructions at home: Medicines  Take over-the-counter and prescription medicines only as told by your health care provider. If you were prescribed an antibiotic medicine, take it as told by your health care provider. Do not stop taking the antibiotic even if you start to feel better.  Abscess care  If you have an abscess that has not drained, apply heat to the affected area. Use the heat source that your health care provider recommends, such as a moist heat pack or a heating pad. Place a towel between your skin and the heat source. Leave the heat on for 20-30 minutes. Remove the heat if your skin turns bright red. This is especially important if you are unable to feel pain, heat, or cold. You may have a greater risk of getting burned. Follow instructions from your health care provider about how to take care of your abscess. Make sure you: Cover the abscess with a bandage (dressing). Change your dressing or gauze as told by your health care provider. Wash your hands with soap and water before you  change the dressing or gauze. If soap and water are not available, use hand sanitizer. Check your abscess every day for signs of a worsening infection. Check for: More redness, swelling, or pain. More fluid or blood. Warmth. More pus or a bad smell.  General instructions To avoid spreading the infection: Do not share personal care items, towels, or hot tubs with others. Avoid making skin contact with other people. Keep all  follow-up visits as told by your health care provider. This is important. Contact a health care provider if you have: More redness, swelling, or pain around your abscess. More fluid or blood coming from your abscess. Warm skin around your abscess. More pus or a bad smell coming from your abscess. A fever. Muscle aches. Chills or a general ill feeling. Get help right away if you: Have severe pain. See red streaks on your skin spreading away from the abscess. Summary A skin abscess is an infected area on or under your skin that contains a collection of pus and other material. A small abscess that drains on its own may not need treatment. Treatment for larger abscesses may include having a procedure to drain the abscess and taking an antibiotic. This information is not intended to replace advice given to you by your health care provider. Make sure you discuss any questions you have with your healthcare provider. Document Revised: 09/17/2018 Document Reviewed: 07/10/2017 Elsevier Patient Education  2022 [employer].

## 2021-01-12 NOTE — Progress Notes (Signed)
@Patient  ID: Sarah [name], female    DOB: [birth year], 55 y.o.   MRN: [id number]  Chief Complaint  Patient presents with   raised red area to back      Referring provider: Ladell Pier, MD   HPI  Patient presents today for an acute visit.  Patient complains of a red raised area that is draining to her right back.  The area is under her bra line.  She states that her symptoms started 2 days ago.  She states that the area is draining purulent/bloody drainage.  The area is warm to the touch and tender.  She has had the same issue a few years ago and did have to have incision and drainage. Denies f/c/s, n/v/d, hemoptysis, PND, chest pain or edema.     Allergies  Allergen Reactions   Emend [Aprepitant] Other (See Comments)    Urticaria    Lisinopril Cough    Immunization History  Administered Date(s) Administered   Influenza Whole 03/22/2019   Influenza,inj,Quad PF,6+ Mos 06/29/2014, 03/27/2015, 03/14/2016, 02/17/2017, 02/20/2018, 01/28/2020   PFIZER(Purple Top)SARS-COV-2 Vaccination 09/10/2019, 10/04/2019   Pneumococcal Polysaccharide-23 06/29/2014   Tdap 11/17/2017   Zoster Recombinat (Shingrix) 01/28/2020, 04/28/2020    Past Medical History:  Diagnosis Date   Arthritis    Diabetes mellitus without complication ([place])    Family history of adverse reaction to anesthesia    sister-n/v   Family history of colon cancer    GERD (gastroesophageal reflux disease)    Glaucoma 02/16   Glucagonoma 07/28/14   Pt denies this but states she has glaucoma   History of chemotherapy    History of hiatal hernia    Hypertension    Imbalance    Neuropathy    feet bilat   Nocturia    Peritoneal carcinomatosis ([place])    carcinoma of ovary    Shortness of breath dyspnea    hx of 2013 - no problems currently    Sleep apnea    Thyroid nodule    history of    Wears glasses     Tobacco History: Social History   Tobacco Use  Smoking Status Never  Smokeless Tobacco Never    Counseling given: Not Answered   Outpatient Encounter Medications as of 01/12/2021  Medication Sig   cephALEXin (KEFLEX) 500 MG capsule Take 1 capsule (500 mg total) by mouth 3 (three) times daily for 10 days.   Accu-Chek Softclix Lancets lancets Check BS three times a day   albuterol (VENTOLIN HFA) 108 (90 Base) MCG/ACT inhaler Inhale 2 puffs into the lungs every 6 (six) hours as needed for wheezing or shortness of breath.   amLODipine (NORVASC) 10 MG tablet Take 1 tablet (10 mg total) by mouth daily.   Blood Glucose Monitoring Suppl (ACCU-CHEK AVIVA PLUS) w/Device KIT USE AS DIRECTED   buPROPion (WELLBUTRIN SR) 150 MG 12 hr tablet Take 1 tablet (150 mg total) by mouth in the morning.   DROPLET PEN NEEDLES 29G X 12MM MISC USE AS DIRECTED   gabapentin (NEURONTIN) 600 MG tablet Take 1 tablet (600 mg total) by mouth 3 (three) times daily.   glucose blood (ACCU-CHEK AVIVA PLUS) test strip TEST BLOOD SUGAR AS DIRECTED   hydrALAZINE (APRESOLINE) 25 MG tablet TAKE 1 AND 1/2 TABLETS THREE TIMES DAILY   Lancets Misc. (ACCU-CHEK SOFTCLIX LANCET DEV) KIT Check blood sugars 3 times a day.   LANTUS SOLOSTAR 100 UNIT/ML Solostar Pen INJECT 30 UNITS DAILY. INCREASE BY 2 UNITS EVERY 3 DAYS  UNTIL MORNING SUGAR IS LESS THAN 130. MAX OF 40 UNITS.   levocetirizine (XYZAL) 5 MG tablet Take 1 tablet (5 mg total) by mouth every evening.   losartan (COZAAR) 50 MG tablet TAKE 1 TABLET EVERY DAY   meloxicam (MOBIC) 15 MG tablet TAKE 1 TABLET EVERY DAY   metFORMIN (GLUCOPHAGE) 1000 MG tablet TAKE 1 TABLET TWICE DAILY WITH MEALS   metoprolol tartrate (LOPRESSOR) 25 MG tablet TAKE 1/2 TABLET TWICE DAILY   montelukast (SINGULAIR) 10 MG tablet Take 1 tablet (10 mg total) by mouth at bedtime.   rosuvastatin (CRESTOR) 20 MG tablet TAKE 1 TABLET EVERY DAY. STOP ATORVASTATIN   Semaglutide, 2 MG/DOSE, 8 MG/3ML SOPN 42m [place] q wk   timolol (BETIMOL) 0.5 % ophthalmic solution Place 1 drop into both eyes daily.   Travoprost,  BAK Free, (TRAVATAN) 0.004 % SOLN ophthalmic solution Place 1 drop into both eyes at bedtime.   vitamin B-12 (CYANOCOBALAMIN) 500 MCG tablet Take 1 tablet (500 mcg total) by mouth daily.   Vitamin D, Ergocalciferol, (DRISDOL) 1.25 MG (50000 UNIT) CAPS capsule Take 1 capsule (50,000 Units total) by mouth every 7 (seven) days.   No facility-administered encounter medications on file as of 01/12/2021.     Review of Systems  Review of Systems  Constitutional: Negative.   HENT: Negative.    Cardiovascular: Negative.   Gastrointestinal: Negative.   Skin:        Red raised area to right back  Allergic/Immunologic: Negative.   Neurological: Negative.   Psychiatric/Behavioral: Negative.        Physical Exam  BP 138/82   Pulse 80   Temp 97.9 F (36.6 C)   LMP  (LMP Unknown)   Wt Readings from Last 5 Encounters:  01/10/21 218 lb (98.9 kg)  12/27/20 218 lb (98.9 kg)  12/13/20 219 lb (99.3 kg)  11/29/20 218 lb (98.9 kg)  11/14/20 216 lb (98 kg)     Physical Exam Vitals and nursing note reviewed.  Constitutional:      General: She is not in acute distress.    Appearance: She is well-developed.  Cardiovascular:     Rate and Rhythm: Normal rate and regular rhythm.  Pulmonary:     Effort: Pulmonary effort is normal.     Breath sounds: Normal breath sounds.  Skin:    Findings: Abscess (right side mid back) present.       Neurological:     Mental Status: She is alert and oriented to person, place, and time.     Lab Results:  CBC    Component Value Date/Time   WBC 6.9 02/03/2020 0837   WBC 6.8 01/11/2020 1017   RBC 4.46 02/03/2020 0837   HGB 13.3 02/03/2020 0837   HGB 13.6 12/04/2018 0914   HGB 12.9 04/22/2016 0750   HCT 41.1 02/03/2020 0837   HCT 41.0 12/04/2018 0914   HCT 39.0 04/22/2016 0750   PLT 270 02/03/2020 0837   PLT 332 12/04/2018 0914   MCV 92.2 02/03/2020 0837   MCV 88 12/04/2018 0914   MCV 92.2 04/22/2016 0750   MCH 29.8 02/03/2020 0837   MCHC  32.4 02/03/2020 0837   RDW 13.5 02/03/2020 0837   RDW 13.6 12/04/2018 0914   RDW 13.9 04/22/2016 0750   LYMPHSABS 2.6 02/03/2020 0837   LYMPHSABS 2.7 04/22/2016 0750   MONOABS 0.8 02/03/2020 0837   MONOABS 1.0 (H) 04/22/2016 0750   EOSABS 0.2 02/03/2020 0837   EOSABS 0.1 04/22/2016 0750   BASOSABS  0.0 02/03/2020 0837   BASOSABS 0.0 04/22/2016 0750    BMET    Component Value Date/Time   NA 140 11/29/2020 1431   NA 138 04/22/2016 0750   K 4.6 11/29/2020 1431   K 3.9 04/22/2016 0750   CL 103 11/29/2020 1431   CO2 19 (L) 11/29/2020 1431   CO2 26 04/22/2016 0750   GLUCOSE 110 (H) 11/29/2020 1431   GLUCOSE 168 (H) 01/11/2020 1017   GLUCOSE 142 (H) 04/22/2016 0750   BUN 22 11/29/2020 1431   BUN 19.1 11/20/2016 1355   CREATININE 1.11 (H) 11/29/2020 1431   CREATININE 1.26 (H) 12/29/2019 1015   CREATININE 1.1 11/20/2016 1355   CALCIUM 11.5 (H) 11/29/2020 1431   CALCIUM 9.8 04/22/2016 0750   GFRNONAA 60 04/17/2020 0928   GFRNONAA 48 (L) 12/29/2019 1015   GFRNONAA 63 07/08/2014 1212   GFRAA 69 04/17/2020 0928   GFRAA 56 (L) 12/29/2019 1015   GFRAA 73 07/08/2014 1212    BNP No results found for: BNP  ProBNP    Component Value Date/Time   PROBNP 583.0 (H) 08/16/2010 1920    Imaging: No results found.   Assessment & Plan:   Cutaneous abscess of back excluding buttocks Access to back:  Warm wet compresses 3 times per day  Will place referral to general surgery  Wash area with dial antibacterial soap, rinse, and pat dry   Follow up:  Follow up if needed     Fenton Foy, NP 01/12/2021

## 2021-01-12 NOTE — Assessment & Plan Note (Signed)
Access to back:  Warm wet compresses 3 times per day  Will place referral to general surgery  Wash area with dial antibacterial soap, rinse, and pat dry   Follow up:  Follow up if needed

## 2021-01-15 ENCOUNTER — Other Ambulatory Visit: Payer: Self-pay | Admitting: Internal Medicine

## 2021-01-22 ENCOUNTER — Encounter: Payer: Self-pay | Admitting: Internal Medicine

## 2021-01-22 ENCOUNTER — Other Ambulatory Visit: Payer: Self-pay

## 2021-01-22 ENCOUNTER — Encounter (INDEPENDENT_AMBULATORY_CARE_PROVIDER_SITE_OTHER): Payer: Self-pay | Admitting: Family Medicine

## 2021-01-22 ENCOUNTER — Ambulatory Visit (INDEPENDENT_AMBULATORY_CARE_PROVIDER_SITE_OTHER): Payer: Medicare HMO | Admitting: Family Medicine

## 2021-01-22 VITALS — BP 130/79 | HR 86 | Temp 98.0°F | Ht 65.0 in | Wt 219.0 lb

## 2021-01-22 DIAGNOSIS — R0602 Shortness of breath: Secondary | ICD-10-CM | POA: Diagnosis not present

## 2021-01-22 DIAGNOSIS — Z794 Long term (current) use of insulin: Secondary | ICD-10-CM | POA: Diagnosis not present

## 2021-01-22 DIAGNOSIS — F3289 Other specified depressive episodes: Secondary | ICD-10-CM | POA: Diagnosis not present

## 2021-01-22 DIAGNOSIS — E1169 Type 2 diabetes mellitus with other specified complication: Secondary | ICD-10-CM | POA: Diagnosis not present

## 2021-01-22 DIAGNOSIS — E1142 Type 2 diabetes mellitus with diabetic polyneuropathy: Secondary | ICD-10-CM

## 2021-01-22 DIAGNOSIS — E559 Vitamin D deficiency, unspecified: Secondary | ICD-10-CM | POA: Diagnosis not present

## 2021-01-22 DIAGNOSIS — Z6837 Body mass index (BMI) 37.0-37.9, adult: Secondary | ICD-10-CM | POA: Diagnosis not present

## 2021-01-22 DIAGNOSIS — IMO0002 Reserved for concepts with insufficient information to code with codable children: Secondary | ICD-10-CM

## 2021-01-22 MED ORDER — SEMAGLUTIDE (2 MG/DOSE) 8 MG/3ML ~~LOC~~ SOPN: PEN_INJECTOR | SUBCUTANEOUS | 0 refills | Status: DC

## 2021-01-22 MED ORDER — VITAMIN D (ERGOCALCIFEROL) 1.25 MG (50000 UNIT) PO CAPS: 50000.0000 [IU] | ORAL_CAPSULE | ORAL | 0 refills | Status: DC

## 2021-01-22 MED ORDER — GABAPENTIN 600 MG PO TABS: 600.0000 mg | ORAL_TABLET | Freq: Three times a day (TID) | ORAL | 3 refills | Status: DC

## 2021-01-22 MED ORDER — BUPROPION HCL ER (SR) 150 MG PO TB12: 150.0000 mg | ORAL_TABLET | Freq: Every morning | ORAL | 0 refills | Status: DC

## 2021-01-23 NOTE — Progress Notes (Signed)
Chief Complaint:   OBESITY Sarah [name] is here to discuss her progress with her obesity treatment plan along with follow-up of her obesity related diagnoses. Sarah [name] is on the Category 1 Plan and states she is following her eating plan approximately 80% of the time. Sarah [name] states she is cycling and walking for 30-45 minutes 3 times per week.  Today's visit was #: 23 Starting weight: 228 lbs Starting date: 02/15/2020 Today's weight: 219 lbs Today's date: 01/22/2021 Total lbs lost to date: 9 Total lbs lost since last in-office visit: 0  Interim History: Sarah [name] just started exercise classes 3 days ago. Today we rechecked her RMR, and it has increased due to increased muscle mass and she has decreased fat mass since starting. Her water intake is 4-5 bottles per day.  Subjective:   1. Type 2 diabetes mellitus with other specified complication, with long-term current use of insulin (HCC) Sarah [name]'s fasting BG range between 117-130's. She is stable and she denies lows or highs.  2. Vitamin D deficiency Sarah [name] is currently taking prescription vitamin D 50,000 IU each week. She denies nausea, vomiting or muscle weakness.  3. Shortness of breath on exertion Sarah [name]'s IC was rechecked today, and it is now 1757.  4. Other depression with emotional eating Sarah [name]'s symptoms are stable and she denies any issues.  Assessment/Plan:   1. Type 2 diabetes mellitus with other specified complication, with long-term current use of insulin (HCC) We will refill Semaglutide for 1 month. Good blood sugar control is important to decrease the likelihood of diabetic complications such as nephropathy, neuropathy, limb loss, blindness, coronary artery disease, and death. Intensive lifestyle modification including diet, exercise and weight loss are the first line of treatment for diabetes.   - Semaglutide, 2 MG/DOSE, 8 MG/3ML SOPN; '2mg'$  [place] q wk  Dispense: 3 mL; Refill: 0  2. Vitamin D deficiency Low Vitamin D level  contributes to fatigue and are associated with obesity, breast, and colon cancer. We will refill prescription Vitamin D for 1 month. Sarah [name] will follow-up for routine testing of Vitamin D, at least 2-3 times per year to avoid over-replacement.  - Vitamin D, Ergocalciferol, (DRISDOL) 1.25 MG (50000 UNIT) CAPS capsule; Take 1 capsule (50,000 Units total) by mouth every 7 (seven) days.  Dispense: 4 capsule; Refill: 0  3. Shortness of breath on exertion - shortness of breath appears to be related to increased BMI and due to a state of poor cardiovascular conditioning as they do not appear to have any red flag sx of concern - Patient agreed to work on weight loss and gradually increase exercise as tolerated to treat her current condition   - If Sarah [name] follows our recommendations and loses 5-10% of their weight without improvement of her shortness of breath or if at any time, pt is losing weight but symptoms become more concerning, they agree to urgently follow up with their PCP for further consideration - Sarah [name] verbalizes agreement with this plan.   4. Other depression with emotional eating Behavior modification techniques were discussed today to help Sarah [name] deal with her emotional/non-hunger eating behaviors. We will refill Wellbutrin SR for 1 month. Orders and follow up as documented in patient record.   - buPROPion (WELLBUTRIN SR) 150 MG 12 hr tablet; Take 1 tablet (150 mg total) by mouth in the morning.  Dispense: 30 tablet; Refill: 0  5. Obesity with current BMI of 36.5 Sarah [name] is currently in the action stage of change. As such, her goal is to continue  with weight loss efforts. She has agreed to change to the Category 2 Plan based on new IC results today.   Exercise goals: As is, increase as tolerated.  Behavioral modification strategies: increasing lean protein intake, decreasing simple carbohydrates, and no skipping meals.  Sarah [name] has agreed to follow-up with our clinic in 3 weeks. She was  informed of the importance of frequent follow-up visits to maximize her success with intensive lifestyle modifications for her multiple health conditions.   Objective:   Blood pressure 130/79, pulse 86, temperature 98 F (36.7 C), height '5\' 5"'$  (1.651 m), weight 219 lb (99.3 kg), SpO2 100 %. Body mass index is 36.44 kg/m.  General: Cooperative, alert, well developed, in no acute distress. HEENT: Conjunctivae and lids unremarkable. Cardiovascular: Regular rhythm.  Lungs: Normal work of breathing. Neurologic: No focal deficits.   Lab Results  Component Value Date   CREATININE 1.11 (H) 11/29/2020   BUN 22 11/29/2020   NA 140 11/29/2020   K 4.6 11/29/2020   CL 103 11/29/2020   CO2 19 (L) 11/29/2020   Lab Results  Component Value Date   ALT 33 (H) 11/29/2020   AST 29 11/29/2020   ALKPHOS 71 11/29/2020   BILITOT 0.6 11/29/2020   Lab Results  Component Value Date   HGBA1C 7.2 (H) 11/29/2020   HGBA1C 5.6 06/14/2020   HGBA1C 6.9 (H) 04/17/2020   HGBA1C 9.0 (H) 01/11/2020   HGBA1C 9.8 (A) 09/23/2019   No results found for: INSULIN Lab Results  Component Value Date   TSH 0.938 02/15/2020   Lab Results  Component Value Date   CHOL 145 11/29/2020   HDL 44 11/29/2020   LDLCALC 60 11/29/2020   TRIG 261 (H) 11/29/2020   CHOLHDL 3.3 11/29/2020   Lab Results  Component Value Date   VD25OH 60.6 11/29/2020   VD25OH 41.54 07/26/2020   VD25OH 44.6 04/17/2020   Lab Results  Component Value Date   WBC 6.9 02/03/2020   HGB 13.3 02/03/2020   HCT 41.1 02/03/2020   MCV 92.2 02/03/2020   PLT 270 02/03/2020   Lab Results  Component Value Date   IRON 43 08/18/2010   TIBC 389 08/18/2010   FERRITIN 227 08/18/2010    Obesity Behavioral Intervention:   Approximately 15 minutes were spent on the discussion below.  ASK: We discussed the diagnosis of obesity with Sarah [name] today and Sarah [name] agreed to give [place] permission to discuss obesity behavioral modification therapy  today.  ASSESS: Sarah [name] has the diagnosis of obesity and her BMI today is 36.5. [name] is in the action stage of change.   ADVISE: Sarah [name] was educated on the multiple health risks of obesity as well as the benefit of weight loss to improve her health. She was advised of the need for long term treatment and the importance of lifestyle modifications to improve her current health and to decrease her risk of future health problems.  AGREE: Multiple dietary modification options and treatment options were discussed and Dorothe agreed to follow the recommendations documented in the above note.  ARRANGE: Tynae was educated on the importance of frequent visits to treat obesity as outlined per CMS and USPSTF guidelines and agreed to schedule her next follow up appointment today.  Attestation Statements:   Reviewed by clinician on day of visit: allergies, medications, problem list, medical history, surgical history, family history, social history, and previous encounter notes.   Wilhemena Durie, am acting as transcriptionist for [employer], DO.  I have reviewed the above  documentation for accuracy and completeness, and I agree with the above. Marjory Sneddon, D.O.  [place] was signed into law in 2016 which includes the topic of electronic health records.  This provides immediate access to information in MyChart.  This includes consultation notes, operative notes, office notes, lab results and pathology reports.  If you have any questions about what you read please let us know at your next visit so we can discuss your concerns and take corrective action if need be.  We are right here with you.

## 2021-01-24 ENCOUNTER — Other Ambulatory Visit: Payer: Self-pay | Admitting: Internal Medicine

## 2021-01-24 DIAGNOSIS — E785 Hyperlipidemia, unspecified: Secondary | ICD-10-CM

## 2021-01-24 NOTE — Telephone Encounter (Signed)
  Notes to clinic:  Patient has office visit tomorrow Review for refill    Requested Prescriptions  Pending Prescriptions Disp Refills   rosuvastatin (CRESTOR) 20 MG tablet [Pharmacy Med Name: ROSUVASTATIN CALCIUM 20 MG Tablet] 90 tablet 3    Sig: TAKE 1 TABLET EVERY DAY. STOP ATORVASTATIN     Cardiovascular:  Antilipid - Statins Failed - 01/24/2021  9:09 AM      Failed - Triglycerides in normal range and within 360 days    Triglycerides  Date Value Ref Range Status  11/29/2020 261 (H) 0 - 149 mg/dL Final          Passed - Total Cholesterol in normal range and within 360 days    Cholesterol, Total  Date Value Ref Range Status  11/29/2020 145 100 - 199 mg/dL Final          Passed - LDL in normal range and within 360 days    LDL Chol Calc (NIH)  Date Value Ref Range Status  11/29/2020 60 0 - 99 mg/dL Final          Passed - HDL in normal range and within 360 days    HDL  Date Value Ref Range Status  11/29/2020 44 >39 mg/dL Final          Passed - Patient is not pregnant      Passed - Valid encounter within last 12 months    Recent Outpatient Visits           1 week ago Cutaneous abscess of back excluding buttocks   [place], Kenney Houseman S, NP   4 months ago Type 2 diabetes mellitus with obesity ([place])   [place], MD   9 months ago Need for shingles vaccine   [place], Jarome Matin, RPH-CPP   9 months ago Type 2 diabetes mellitus with obesity [facility])   [place], MD   12 months ago Need for shingles vaccine   [place], Jarome Matin, RPH-CPP       Future Appointments             Tomorrow Ladell Pier, MD [place]

## 2021-01-25 ENCOUNTER — Other Ambulatory Visit: Payer: Self-pay | Admitting: Internal Medicine

## 2021-01-25 ENCOUNTER — Other Ambulatory Visit: Payer: Self-pay

## 2021-01-25 ENCOUNTER — Ambulatory Visit: Payer: Medicare HMO | Attending: Internal Medicine | Admitting: Internal Medicine

## 2021-01-25 VITALS — BP 134/84 | HR 77 | Resp 16 | Wt 223.6 lb

## 2021-01-25 DIAGNOSIS — E1159 Type 2 diabetes mellitus with other circulatory complications: Secondary | ICD-10-CM

## 2021-01-25 DIAGNOSIS — E1169 Type 2 diabetes mellitus with other specified complication: Secondary | ICD-10-CM | POA: Diagnosis not present

## 2021-01-25 DIAGNOSIS — Z2821 Immunization not carried out because of patient refusal: Secondary | ICD-10-CM | POA: Diagnosis not present

## 2021-01-25 DIAGNOSIS — L02212 Cutaneous abscess of back [any part, except buttock]: Secondary | ICD-10-CM | POA: Diagnosis not present

## 2021-01-25 DIAGNOSIS — E1165 Type 2 diabetes mellitus with hyperglycemia: Secondary | ICD-10-CM

## 2021-01-25 DIAGNOSIS — E785 Hyperlipidemia, unspecified: Secondary | ICD-10-CM | POA: Diagnosis not present

## 2021-01-25 DIAGNOSIS — E1142 Type 2 diabetes mellitus with diabetic polyneuropathy: Secondary | ICD-10-CM | POA: Diagnosis not present

## 2021-01-25 DIAGNOSIS — E669 Obesity, unspecified: Secondary | ICD-10-CM | POA: Diagnosis not present

## 2021-01-25 DIAGNOSIS — I152 Hypertension secondary to endocrine disorders: Secondary | ICD-10-CM

## 2021-01-25 DIAGNOSIS — I10 Essential (primary) hypertension: Secondary | ICD-10-CM

## 2021-01-25 LAB — GLUCOSE, POCT (MANUAL RESULT ENTRY): POC Glucose: 121 mg/dl — AB (ref 70–99)

## 2021-01-25 NOTE — Telephone Encounter (Signed)
Notes to clinic:  Patient has appt today    Requested Prescriptions  Pending Prescriptions Disp Refills   metFORMIN (GLUCOPHAGE) 1000 MG tablet [Pharmacy Med Name: METFORMIN HYDROCHLORIDE 1000 MG Tablet] 180 tablet 1    Sig: TAKE 1 TABLET TWICE DAILY WITH MEALS     Endocrinology:  Diabetes - Biguanides Failed - 01/25/2021  7:03 AM      Failed - Cr in normal range and within 360 days    Creatinine  Date Value Ref Range Status  12/29/2019 1.26 (H) 0.44 - 1.00 mg/dL Final  11/20/2016 1.1 0.6 - 1.1 mg/dL Final   Creatinine, Ser  Date Value Ref Range Status  11/29/2020 1.11 (H) 0.57 - 1.00 mg/dL Final   Creatinine, POC  Date Value Ref Range Status  11/13/2016 100 mg/dL Final   Creatinine, Urine  Date Value Ref Range Status  07/27/2015 174 20 - 320 mg/dL Final          Passed - HBA1C is between 0 and 7.9 and within 180 days    HbA1c, POC (controlled diabetic range)  Date Value Ref Range Status  09/23/2019 9.8 (A) 0.0 - 7.0 % Final   Hgb A1c MFr Bld  Date Value Ref Range Status  11/29/2020 7.2 (H) 4.8 - 5.6 % Final    Comment:             Prediabetes: 5.7 - 6.4          Diabetes: >6.4          Glycemic control for adults with diabetes: <7.0           Passed - AA eGFR in normal range and within 360 days    GFR, Est African American  Date Value Ref Range Status  07/08/2014 73 mL/min Final   GFR, Est AFR Am  Date Value Ref Range Status  12/29/2019 56 (L) >60 mL/min Final   GFR calc Af Amer  Date Value Ref Range Status  04/17/2020 69 >59 mL/min/1.73 Final    Comment:    **In accordance with recommendations from the NKF-ASN Task force,**   [facility] is in the process of updating its eGFR calculation to the   2021 CKD-EPI creatinine equation that estimates kidney function   without a race variable.    GFR, Est Non African American  Date Value Ref Range Status  07/08/2014 63 mL/min Final    Comment:      The estimated GFR is a calculation valid for adults  (>=76 years old) that uses the CKD-EPI algorithm to adjust for age and sex. It is   not to be used for children, pregnant women, hospitalized patients,    patients on dialysis, or with rapidly changing kidney function. According to the NKDEP, eGFR >89 is normal, 60-89 shows mild impairment, 30-59 shows moderate impairment, 15-29 shows severe impairment and <15 is ESRD.      GFR, Estimated  Date Value Ref Range Status  12/29/2019 48 (L) >60 mL/min Final   GFR calc non Af Amer  Date Value Ref Range Status  04/17/2020 60 >59 mL/min/1.73 Final   eGFR  Date Value Ref Range Status  11/29/2020 59 (L) >59 mL/min/1.73 Final          Passed - Valid encounter within last 6 months    Recent Outpatient Visits           1 week ago Cutaneous abscess of back excluding buttocks   [place]  S, NP   4 months ago Type 2 diabetes mellitus with obesity [facility])   [place], MD   9 months ago Need for shingles vaccine   [place], Stephen L, RPH-CPP   9 months ago Type 2 diabetes mellitus with obesity [facility])   [place], MD   12 months ago Need for shingles vaccine   [place], RPH-CPP       Future Appointments             Today Ladell Pier, MD [place]

## 2021-01-25 NOTE — Progress Notes (Signed)
Patient ID: Sarah [name], female    DOB: July 02, 1965  MRN: [id number]  CC: chronic ds management  Subjective: Sarah [name] is a 55 y.o. 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 (S/p adjuvant chemotherapy completed on 12/08/14. S/p ex lap, hernia repair and small bowel resection for a presumed recurrence (benign pathology), Hypercalcemia with elevated free light chains/ likely MGUS, had BM bx (followed by [name]), dx with [place] by [name], 1.1 cm RLL nodule (bx negative 01/2020), RT thyroid nodule (does not met criteria for bx 10/2019),   DIABETES TYPE 2 Last A1C:   Results for orders placed or performed in visit on 01/25/21  POCT glucose (manual entry)  Result Value Ref Range   POC Glucose 121 (A) 70 - 99 mg/dl    Lab Results  Component Value Date   HGBA1C 7.2 (H) 11/29/2020   Med Adherence:  [x] Yes  -on Ozempic, Metformin and Lantus 30 Medication side effects:  [] Yes    [x] No Home Monitoring?  [x] Yes  BID  [] No Home glucose results range:117-130 before meal Diet Adherence: [x] Yes dec appetite.  She is still in the medical weight management program. Exercise: [x] Yes  -goes to [facility] 3x/wk.  Goes to [employer] class, rides bike or TM  [] No Hypoglycemic episodes?: [] Yes    [x] No Numbness of the feet? [] Yes    [x] No, some tingling Retinopathy hx? [] Yes    [x] No Last eye exam: schedule for 02/2021 Comments:   HYPERTENSION Currently taking: see medication list.  She is on hydralazine, Cozaar, metoprolol, Norvasc Med Adherence: [x] Yes    [] No Medication side effects: [] Yes    [x] No Adherence with salt restriction: [x] Yes    [] No Home Monitoring?: [] Yes    [x] No but has device Monitoring Frequency:  Home BP results range:  SOB? [x]  Yes    [] No Chest Pain?: [] Yes    [x] No Leg swelling?: [] Yes    [x] No, sometimes swelling in RT foot Headaches?: [] Yes    [x] No Dizziness? [] Yes    [x] No Comments:   HL: taking and tolerating Crestor.  Saw PA for abscess RT flank on 01/12/2021.  Given abx.  Ruptured on its own but area still fluctuant.  Referred to general surgery.  She states she was called but no appointment available until October.  HM: plans to get COVID booster.  Decline Pneumonia vaccine Patient Active Problem List   Diagnosis Date Noted   Cutaneous abscess of back excluding buttocks 01/12/2021   Depressed mood, with emotional eating 10/23/2020   Seasonal allergies 09/05/2020   Class 2 severe obesity with serious comorbidity and body mass index (BMI) of 36.0 to 36.9 in adult ([place]) 07/24/2020   At risk for impaired metabolic function 32/95/1884   Hypertension associated with type 2 diabetes mellitus ([place]) 05/15/2020   Stage 3 chronic kidney disease ([place]) 05/01/2020   Type 2 diabetes mellitus with chronic kidney disease, with long-term current use of insulin ([place]) 04/17/2020   At risk for hypoglycemia 04/17/2020   B12 deficiency 02/29/2020   Diabetes mellitus ([place]) 02/15/2020   Vitamin D deficiency 02/15/2020   Other insomnia 01/24/2020  Morbid obesity ([place]) 01/24/2020   Elevated serum immunoglobulin free light chains 01/21/2020   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[place]) 01/05/2020   Thyroid nodule 10/14/2019   Renal insufficiency 06/24/2019   Hypercalcemia 06/24/2019   Hiatal hernia with GERD without esophagitis 06/18/2017   Need for influenza vaccination 02/17/2017   Ventral hernia 01/24/2016   Type 2 diabetes mellitus without complication, without long-term current use of insulin ([place]) 10/04/2015   Obesity (BMI 30-39.9) 10/04/2015   Hyperlipidemia associated with type 2 diabetes mellitus ([place]) 07/27/2015   Neuropathic pain of both legs 07/27/2015   Obesity (BMI 30.0-34.9) 12/24/2014   Genetic testing  11/01/2014   Constipation - functional 09/24/2014   Chemotherapy-induced peripheral neuropathy ([place]) 09/24/2014   Midline low back pain without sciatica 09/24/2014   Family history of colon cancer    Ovarian cancer, right ([place]) 07/08/2014   Hilar adenopathy    Hypertension associated with diabetes ([place])    Right lower lobe pulmonary nodule      Current Outpatient Medications on File Prior to Visit  Medication Sig Dispense Refill   Accu-Chek Softclix Lancets lancets Check BS three times a day 300 each 12   albuterol (VENTOLIN HFA) 108 (90 Base) MCG/ACT inhaler Inhale 2 puffs into the lungs every 6 (six) hours as needed for wheezing or shortness of breath. 8 g 0   amLODipine (NORVASC) 10 MG tablet Take 1 tablet (10 mg total) by mouth daily. 90 tablet 3   Blood Glucose Monitoring Suppl (ACCU-CHEK AVIVA PLUS) w/Device KIT USE AS DIRECTED 1 kit 0   buPROPion (WELLBUTRIN SR) 150 MG 12 hr tablet Take 1 tablet (150 mg total) by mouth in the morning. 30 tablet 0   DROPLET PEN NEEDLES 29G X 12MM MISC USE AS DIRECTED 100 each 0   gabapentin (NEURONTIN) 600 MG tablet Take 1 tablet (600 mg total) by mouth 3 (three) times daily. 270 tablet 3   glucose blood (ACCU-CHEK AVIVA PLUS) test strip TEST BLOOD SUGAR AS DIRECTED 300 strip 12   hydrALAZINE (APRESOLINE) 25 MG tablet TAKE 1 AND 1/2 TABLETS THREE TIMES DAILY 405 tablet 3   Lancets Misc. (ACCU-CHEK SOFTCLIX LANCET DEV) KIT Check blood sugars 3 times a day. 3 kit 6   LANTUS SOLOSTAR 100 UNIT/ML Solostar Pen INJECT 30 UNITS DAILY. INCREASE BY 2 UNITS EVERY 3 DAYS UNTIL MORNING SUGAR IS LESS THAN 130. MAX OF 40 UNITS. 45 mL 1   levocetirizine (XYZAL) 5 MG tablet Take 1 tablet (5 mg total) by mouth every evening. 90 tablet 0   losartan (COZAAR) 50 MG tablet TAKE 1 TABLET EVERY DAY 90 tablet 0   meloxicam (MOBIC) 15 MG tablet TAKE 1 TABLET EVERY DAY 90 tablet 1   metFORMIN (GLUCOPHAGE) 1000 MG tablet TAKE 1 TABLET TWICE DAILY WITH MEALS 180 tablet 1    metoprolol tartrate (LOPRESSOR) 25 MG tablet TAKE 1/2 TABLET TWICE DAILY 90 tablet 0   montelukast (SINGULAIR) 10 MG tablet Take 1 tablet (10 mg total) by mouth at bedtime. 90 tablet 0   rosuvastatin (CRESTOR) 20 MG tablet Take 1 tablet (20 mg total) by mouth daily. 90 tablet 0   Semaglutide, 2 MG/DOSE, 8 MG/3ML SOPN 55m [place] q wk 3 mL 0   timolol (BETIMOL) 0.5 % ophthalmic solution Place 1 drop into both eyes daily.     Travoprost, BAK Free, (TRAVATAN) 0.004 % SOLN ophthalmic solution Place 1 drop into both eyes at bedtime.     vitamin B-12 (CYANOCOBALAMIN) 500 MCG tablet Take 1 tablet (  500 mcg total) by mouth daily.     Vitamin D, Ergocalciferol, (DRISDOL) 1.25 MG (50000 UNIT) CAPS capsule Take 1 capsule (50,000 Units total) by mouth every 7 (seven) days. 4 capsule 0   No current facility-administered medications on file prior to visit.    Allergies  Allergen Reactions   Emend [Aprepitant] Other (See Comments)    Urticaria    Lisinopril Cough    Social History   Socioeconomic History   Marital status: Married    Spouse name: Not on file   Number of children: 2   Years of education: Not on file   Highest education level: High school graduate  Occupational History   Occupation: disability  Tobacco Use   Smoking status: Never   Smokeless tobacco: Never  Vaping Use   Vaping Use: Never used  Substance and Sexual Activity   Alcohol use: No   Drug use: No   Sexual activity: Not on file  Other Topics Concern   Not on file  Social History Narrative   No issues with transportation.    Attends church   Social Determinants of Health   Financial Resource Strain: Not on file  Food Insecurity: Not on file  Transportation Needs: Not on file  Physical Activity: Not on file  Stress: Not on file  Social Connections: Not on file  Intimate Partner Violence: Not on file    Family History  Problem Relation Age of Onset   Hypertension Mother    Hyperlipidemia Mother    Stroke  Mother    Thyroid disease Mother    Hypertension Father    Diabetes Father    Hyperlipidemia Father    Sudden death Father    Cancer Sister 64       fibrosarcoma (back); currently 27   Prostate cancer Maternal Uncle    Colon cancer Paternal Aunt        Dx 64s; deceased 77s   Prostate cancer Paternal Uncle        currently 78   Cancer Paternal Uncle 81       "bone" ; unk. primary   Stomach cancer Paternal Uncle    Hypercalcemia Neg Hx     Past Surgical History:  Procedure Laterality Date   ABDOMINAL HYSTERECTOMY     FOOT SURGERY  04/2018   [facility]   INCISIONAL HERNIA REPAIR N/A 07/06/2015   Procedure: INCISIONAL HERNIA REPAIR ;  Surgeon: Rolm Bookbinder, MD;  Location: [facility];  Service: General;  Laterality: N/A;   INSERTION OF MESH N/A 07/06/2015   Procedure: WITH INSERTION OF PHASIX ST MESH;  Surgeon: Rolm Bookbinder, MD;  Location: [facility];  Service: General;  Laterality: N/A;   LAPAROTOMY N/A 07/06/2015   Procedure: EXPLORATORY LAPAROTOMY;  Surgeon: Everitt Amber, MD;  Location: [facility];  Service: Gynecology;  Laterality: N/A;   LYSIS OF ADHESION N/A 07/06/2015   Procedure:  LYSIS OF ADHESION RESECTION OF MESENTERIC MASS BOWEL RESECTION ;  Surgeon: Everitt Amber, MD;  Location: [facility];  Service: Gynecology;  Laterality: N/A;   ROBOTIC ASSISTED TOTAL HYSTERECTOMY WITH BILATERAL SALPINGO OOPHERECTOMY Bilateral 07/28/2014   Procedure: ROBOTIC ASSISTED lysis of adhesions with biopsies, converted to LAPAROTOMY, bilateral salpingoorphorectomy, omentectomy,appendectomy;  Surgeon: Janie Morning, MD;  Location: [facility];  Service: Gynecology;  Laterality: Bilateral;   THYROIDECTOMY, PARTIAL     VIDEO BRONCHOSCOPY N/A 01/13/2020   Procedure: VIDEO BRONCHOSCOPY WITH BIOPSIES;  Surgeon: Melrose Nakayama, MD;  Location: [facility];  Service: Thoracic;  Laterality: N/A;  ROS: Review of Systems Negative except as stated above  PHYSICAL EXAM: BP 134/84   Pulse 77   Resp 16   Wt 223 lb  9.6 oz (101.4 kg)   LMP  (LMP Unknown)   SpO2 97%   BMI 37.21 kg/m   Wt Readings from Last 3 Encounters:  01/25/21 223 lb 9.6 oz (101.4 kg)  01/22/21 219 lb (99.3 kg)  01/10/21 218 lb (98.9 kg)    Physical Exam  General appearance - alert, well appearing, and in no distress Mental status - normal mood, behavior, speech, dress, motor activity, and thought processes Neck - supple, no significant adenopathy Chest - clear to auscultation, no wheezes, rales or rhonchi, symmetric air entry Heart - normal rate, regular rhythm, normal S1, S2, no murmurs, rubs, clicks or gallops Extremities - peripheral pulses normal, no pedal edema, no clubbing or cyanosis Skin: There is about a  4 cm area below the right scapula where the skin has peeled.  There is about a 3 cm area of fluctuance in the center.  No pus expressed.  CMP Latest Ref Rng & Units 11/29/2020 07/26/2020 04/17/2020  Glucose 65 - 99 mg/dL 110(H) - 74  BUN 6 - 24 mg/dL 22 - 20  Creatinine 0.57 - 1.00 mg/dL 1.11(H) - 1.06(H)  Sodium 134 - 144 mmol/L 140 - 142  Potassium 3.5 - 5.2 mmol/L 4.6 - 4.2  Chloride 96 - 106 mmol/L 103 - 104  CO2 20 - 29 mmol/L 19(L) - 24  Calcium 8.7 - 10.2 mg/dL 11.5(H) 11.7(H) 11.9(H)  Total Protein 6.0 - 8.5 g/dL 7.0 - -  Total Bilirubin 0.0 - 1.2 mg/dL 0.6 - -  Alkaline Phos 44 - 121 IU/L 71 - -  AST 0 - 40 IU/L 29 - -  ALT 0 - 32 IU/L 33(H) - -   Lipid Panel     Component Value Date/Time   CHOL 145 11/29/2020 1431   TRIG 261 (H) 11/29/2020 1431   HDL 44 11/29/2020 1431   CHOLHDL 3.3 11/29/2020 1431   CHOLHDL 5.1 (H) 08/14/2016 1729   VLDL 74 (H) 08/14/2016 1729   LDLCALC 60 11/29/2020 1431    CBC    Component Value Date/Time   WBC 6.9 02/03/2020 0837   WBC 6.8 01/11/2020 1017   RBC 4.46 02/03/2020 0837   HGB 13.3 02/03/2020 0837   HGB 13.6 12/04/2018 0914   HGB 12.9 04/22/2016 0750   HCT 41.1 02/03/2020 0837   HCT 41.0 12/04/2018 0914   HCT 39.0 04/22/2016 0750   PLT 270 02/03/2020  0837   PLT 332 12/04/2018 0914   MCV 92.2 02/03/2020 0837   MCV 88 12/04/2018 0914   MCV 92.2 04/22/2016 0750   MCH 29.8 02/03/2020 0837   MCHC 32.4 02/03/2020 0837   RDW 13.5 02/03/2020 0837   RDW 13.6 12/04/2018 0914   RDW 13.9 04/22/2016 0750   LYMPHSABS 2.6 02/03/2020 0837   LYMPHSABS 2.7 04/22/2016 0750   MONOABS 0.8 02/03/2020 0837   MONOABS 1.0 (H) 04/22/2016 0750   EOSABS 0.2 02/03/2020 0837   EOSABS 0.1 04/22/2016 0750   BASOSABS 0.0 02/03/2020 0837   BASOSABS 0.0 04/22/2016 0750    ASSESSMENT AND PLAN: 1. Type 2 diabetes mellitus with obesity ([place]) Reported blood sugar readings are at goal. Continue Ozempic, Lantus and metformin. Encouraged her to continue healthy eating habits and regular exercise. - POCT glucose (manual entry)  2. Hypertension associated with diabetes ([place]) Close to goal.  Encouraged her to  check blood pressure at least once a week with goal being 130/80 or lower.  Continue current medications.  3. Hyperlipidemia associated with type 2 diabetes mellitus ([place]) Continue Crestor.  4. Cutaneous abscess of back excluding buttocks Message sent to our referral coordinator to see whether she can get the appointment moved up with the surgeon.  Patient states that this has drained quite a bit on its own but she still seems to have a little bit of residual pus in the air  5. Pneumococcal vaccination declined   Patient was given the opportunity to ask questions.  Patient verbalized understanding of the plan and was able to repeat key elements of the plan.   Orders Placed This Encounter  Procedures   POCT glucose (manual entry)     Requested Prescriptions    No prescriptions requested or ordered in this encounter    Return in about 4 months (around 05/27/2021).  Karle Plumber, MD, FACP

## 2021-01-25 NOTE — Patient Instructions (Signed)
Please try to check your blood pressure at least once a week.  The goal is 130/80 or lower.

## 2021-01-25 NOTE — Telephone Encounter (Signed)
Notes to clinic:  Patient has appt today Medication filled today Review for future refills   Requested Prescriptions  Pending Prescriptions Disp Refills   losartan (COZAAR) 50 MG tablet [Pharmacy Med Name: LOSARTAN POTASSIUM 50 MG Tablet] 90 tablet 0    Sig: TAKE 1 TABLET EVERY DAY     Cardiovascular:  Angiotensin Receptor Blockers Failed - 01/25/2021  7:12 AM      Failed - Cr in normal range and within 180 days    Creatinine  Date Value Ref Range Status  12/29/2019 1.26 (H) 0.44 - 1.00 mg/dL Final  11/20/2016 1.1 0.6 - 1.1 mg/dL Final   Creatinine, Ser  Date Value Ref Range Status  11/29/2020 1.11 (H) 0.57 - 1.00 mg/dL Final   Creatinine, POC  Date Value Ref Range Status  11/13/2016 100 mg/dL Final   Creatinine, Urine  Date Value Ref Range Status  07/27/2015 174 20 - 320 mg/dL Final          Passed - K in normal range and within 180 days    Potassium  Date Value Ref Range Status  11/29/2020 4.6 3.5 - 5.2 mmol/L Final  04/22/2016 3.9 3.5 - 5.1 mEq/L Final          Passed - Patient is not pregnant      Passed - Last BP in normal range    BP Readings from Last 1 Encounters:  01/22/21 130/79          Passed - Valid encounter within last 6 months    Recent Outpatient Visits           1 week ago Cutaneous abscess of back excluding buttocks   [place], Kenney Houseman S, NP   4 months ago Type 2 diabetes mellitus with obesity ([place])   [place] Karle Plumber B, MD   9 months ago Need for shingles vaccine   [place], Stephen L, RPH-CPP   9 months ago Type 2 diabetes mellitus with obesity ([place])   [place] Ladell Pier, MD   12 months ago Need for shingles vaccine   [place], Stephen L, RPH-CPP       Future Appointments             Today Ladell Pier, MD [place]             hydrALAZINE (APRESOLINE) 25 MG tablet [Pharmacy Med Name: HYDRALAZINE HYDROCHLORIDE 25 MG Tablet] 405 tablet 3    Sig: TAKE 1 AND 1/2 TABLETS THREE TIMES DAILY     Cardiovascular:  Vasodilators Passed - 01/25/2021  7:12 AM      Passed - HCT in normal range and within 360 days    HCT  Date Value Ref Range Status  02/03/2020 41.1 36.0 - 46.0 % Final  04/22/2016 39.0 34.8 - 46.6 % Final   Hematocrit  Date Value Ref Range Status  12/04/2018 41.0 34.0 - 46.6 % Final          Passed - HGB in normal range and within 360 days    Hemoglobin  Date Value Ref Range Status  02/03/2020 13.3 12.0 - 15.0 g/dL Final  12/04/2018 13.6 11.1 - 15.9 g/dL Final   HGB  Date Value Ref Range Status  04/22/2016 12.9 11.6 - 15.9 g/dL Final  Passed - RBC in normal range and within 360 days    RBC  Date Value Ref Range Status  02/03/2020 4.46 3.87 - 5.11 MIL/uL Final          Passed - WBC in normal range and within 360 days    WBC  Date Value Ref Range Status  01/11/2020 6.8 4.0 - 10.5 K/uL Final   WBC Count  Date Value Ref Range Status  02/03/2020 6.9 4.0 - 10.5 K/uL Final          Passed - PLT in normal range and within 360 days    Platelets  Date Value Ref Range Status  12/04/2018 332 150 - 450 x10E3/uL Final   Platelet Count  Date Value Ref Range Status  02/03/2020 270 150 - 400 K/uL Final          Passed - Last BP in normal range    BP Readings from Last 1 Encounters:  01/22/21 130/79          Passed - Valid encounter within last 12 months    Recent Outpatient Visits           1 week ago Cutaneous abscess of back excluding buttocks   [place], Kriste Basque, NP   4 months ago Type 2 diabetes mellitus with obesity [facility])   [place], MD   9 months ago Need for shingles vaccine   [place], Jarome Matin, RPH-CPP   9 months ago Type 2 diabetes mellitus with obesity [facility])   [place], MD   12 months ago Need for shingles vaccine   [place], Jarome Matin, RPH-CPP       Future Appointments             Today Ladell Pier, MD [place]

## 2021-02-01 ENCOUNTER — Encounter: Payer: Self-pay | Admitting: Internal Medicine

## 2021-02-02 ENCOUNTER — Other Ambulatory Visit: Payer: Self-pay | Admitting: Internal Medicine

## 2021-02-02 DIAGNOSIS — N6310 Unspecified lump in the right breast, unspecified quadrant: Secondary | ICD-10-CM

## 2021-02-05 ENCOUNTER — Other Ambulatory Visit: Payer: Self-pay

## 2021-02-05 ENCOUNTER — Encounter (INDEPENDENT_AMBULATORY_CARE_PROVIDER_SITE_OTHER): Payer: Self-pay | Admitting: Family Medicine

## 2021-02-05 ENCOUNTER — Ambulatory Visit (INDEPENDENT_AMBULATORY_CARE_PROVIDER_SITE_OTHER): Payer: Medicare HMO | Admitting: Family Medicine

## 2021-02-05 VITALS — BP 107/72 | HR 92 | Temp 97.8°F | Ht 65.0 in | Wt 219.0 lb

## 2021-02-05 DIAGNOSIS — E559 Vitamin D deficiency, unspecified: Secondary | ICD-10-CM

## 2021-02-05 DIAGNOSIS — Z6837 Body mass index (BMI) 37.0-37.9, adult: Secondary | ICD-10-CM

## 2021-02-05 DIAGNOSIS — F3289 Other specified depressive episodes: Secondary | ICD-10-CM | POA: Diagnosis not present

## 2021-02-05 DIAGNOSIS — Z794 Long term (current) use of insulin: Secondary | ICD-10-CM

## 2021-02-05 DIAGNOSIS — E1169 Type 2 diabetes mellitus with other specified complication: Secondary | ICD-10-CM

## 2021-02-05 MED ORDER — VITAMIN D (ERGOCALCIFEROL) 1.25 MG (50000 UNIT) PO CAPS: 50000.0000 [IU] | ORAL_CAPSULE | ORAL | 0 refills | Status: DC

## 2021-02-05 MED ORDER — BUPROPION HCL ER (SR) 150 MG PO TB12: 150.0000 mg | ORAL_TABLET | Freq: Every morning | ORAL | 0 refills | Status: DC

## 2021-02-05 MED ORDER — SEMAGLUTIDE (2 MG/DOSE) 8 MG/3ML ~~LOC~~ SOPN: PEN_INJECTOR | SUBCUTANEOUS | 0 refills | Status: DC

## 2021-02-06 NOTE — Progress Notes (Signed)
Chief Complaint:   OBESITY Sarah [name] is here to discuss her progress with her obesity treatment plan along with follow-up of her obesity related diagnoses. Sarah [name] is on the Category 2 Plan and states she is following her eating plan approximately 90% of the time. Sarah [name] states she is taking a exercise class (mostly cardio), Silver sneakers for 45 minutes 3 times per week.  Today's visit was #: 24 Starting weight: 228 lbs Starting date: 02/15/2020 Today's weight: 219 lbs Today's date: 02/05/2021 Total lbs lost to date: 9 Total lbs lost since last in-office visit: 0  Interim History: Sarah [name] loves her exercise classes on Monday, Wednesday, and Fridays. She has been doing it for 2 weeks consistently. She is eating better and making an effort to get protein in. She is still skipping meals, especially lunch.  Subjective:   1. Type 2 diabetes mellitus with other specified complication, with long-term current use of insulin (HCC) Sarah [name] is tolerating her medications well with no side effects. She doesn't feel this has caused decreased appetite though. Her fasting BGs run at 102 and 107, and she denies lows or highs.  2. Vitamin D deficiency Sarah [name] is tolerating medication(s) well without side effects.  Medication compliance is good and patient appears to be taking it as prescribed.  Denies additional concerns regarding this condition.   3. Other depression with emotional eating Sarah [name] notes her mood is stable.  Assessment/Plan:  No orders of the defined types were placed in this encounter.   Medications Discontinued During This Encounter  Medication Reason   buPROPion (WELLBUTRIN SR) 150 MG 12 hr tablet Reorder   Vitamin D, Ergocalciferol, (DRISDOL) 1.25 MG (50000 UNIT) CAPS capsule Reorder   Semaglutide, 2 MG/DOSE, 8 MG/3ML SOPN Reorder     Meds ordered this encounter  Medications   buPROPion (WELLBUTRIN SR) 150 MG 12 hr tablet    Sig: Take 1 tablet (150 mg total) by mouth  in the morning.    Dispense:  30 tablet    Refill:  0   Semaglutide, 2 MG/DOSE, 8 MG/3ML SOPN    Sig: '2mg'$  [place] q wk    Dispense:  3 mL    Refill:  0    Ov for RF   Vitamin D, Ergocalciferol, (DRISDOL) 1.25 MG (50000 UNIT) CAPS capsule    Sig: Take 1 capsule (50,000 Units total) by mouth every 7 (seven) days.    Dispense:  4 capsule    Refill:  0    30 d only- pt needs OV for RF     1. Type 2 diabetes mellitus with other specified complication, with long-term current use of insulin (HCC) Lyndzi will continue Ozempic at 2 mg, and we will refill for 1 month.  - Counseled patient on pathophysiology of disease and discussed good blood sugar control is important to decrease the likelihood of diabetic complications such as nephropathy, neuropathy, limb loss, blindness, coronary artery disease, and death.  Intensive lifestyle modification including diet, exercise and weight loss are the first line of treatment for diabetes.    - Continue home blood sugar monitoring regularly - closely- especially with continued wt loss.  Reviewed blood sugar goals.  Reminded if pt feels poorly- check BS and BP at that time.  Bring in BS and BP logs each OV. - Eat on a regular basis- no skipping or going long periods without eating.  We discussed hypoglycemia prevention. - Any concerns about medicines should be directed at the prescribing provider -  Recheck labs in 3 months if not done at Endo/ PCP.  - Importance of f/up with PCP and all other specialists as scheduled was stressed to pt today - Pt should contact their endocrinologist or PCP as they see fit with any Q's/ concerns with what we discuss.   - It is recommended for pt to continue with current plan and medication doses at this time  ( please adjust verbage if I change treatment- thnx!)   - Semaglutide, 2 MG/DOSE, 8 MG/3ML SOPN; '2mg'$  [place] q wk  Dispense: 3 mL; Refill: 0  2. Vitamin D deficiency Sarah [name] will continue prescription Vit D, and we will refill for  1 month.  - Discussed importance of vitamin D to their health and well-being.  - possible symptoms of low Vitamin D can be low energy, depressed mood, muscle aches, joint aches, osteoporosis etc. - low Vitamin D levels may be linked to an increased risk of cardiovascular events and even increased risk of cancers- such as colon and breast.  - I recommend pt take a 50,000 IU weekly prescription vit D - see script below   - Informed patient this may be a lifelong thing, and she was encouraged to continue to take the medicine until told otherwise.   - we will need to monitor levels regularly (every 3-4 mo on average) to keep levels within normal limits.  - weight loss will likely improve availability of vitamin D, thus encouraged [name] to continue with meal plan and their weight loss efforts to further improve this condition - pt's questions and concerns regarding this condition addressed.  - Vitamin D, Ergocalciferol, (DRISDOL) 1.25 MG (50000 UNIT) CAPS capsule; Take 1 capsule (50,000 Units total) by mouth every 7 (seven) days.  Dispense: 4 capsule; Refill: 0  3. Other depression with emotional eating Behavior modification techniques were discussed today to help Sarah [name] deal with her emotional/non-hunger eating behaviors. We will refill Wellbutrin SR for 1 month. Orders and follow up as documented in patient record.   - buPROPion (WELLBUTRIN SR) 150 MG 12 hr tablet; Take 1 tablet (150 mg total) by mouth in the morning.  Dispense: 30 tablet; Refill: 0  4. Obesity with current BMI of 36.6 Sarah [name] is currently in the action stage of change. As such, her goal is to continue with weight loss efforts. She has agreed to the Category 2 Plan.   Exercise goals: As is.  Behavioral modification strategies: increasing lean protein intake and no skipping meals.  Allegra has agreed to follow-up with our clinic in 3 weeks. She was informed of the importance of frequent follow-up visits to maximize her success  with intensive lifestyle modifications for her multiple health conditions.   Objective:   Blood pressure 107/72, pulse 92, temperature 97.8 F (36.6 C), height '5\' 5"'$  (1.651 m), weight 219 lb (99.3 kg), SpO2 100 %. Body mass index is 36.44 kg/m.  General: Cooperative, alert, well developed, in no acute distress. HEENT: Conjunctivae and lids unremarkable. Cardiovascular: Regular rhythm.  Lungs: Normal work of breathing. Neurologic: No focal deficits.   Lab Results  Component Value Date   CREATININE 1.11 (H) 11/29/2020   BUN 22 11/29/2020   NA 140 11/29/2020   K 4.6 11/29/2020   CL 103 11/29/2020   CO2 19 (L) 11/29/2020   Lab Results  Component Value Date   ALT 33 (H) 11/29/2020   AST 29 11/29/2020   ALKPHOS 71 11/29/2020   BILITOT 0.6 11/29/2020   Lab Results  Component Value Date  HGBA1C 7.2 (H) 11/29/2020   HGBA1C 5.6 06/14/2020   HGBA1C 6.9 (H) 04/17/2020   HGBA1C 9.0 (H) 01/11/2020   HGBA1C 9.8 (A) 09/23/2019   No results found for: INSULIN Lab Results  Component Value Date   TSH 0.938 02/15/2020   Lab Results  Component Value Date   CHOL 145 11/29/2020   HDL 44 11/29/2020   LDLCALC 60 11/29/2020   TRIG 261 (H) 11/29/2020   CHOLHDL 3.3 11/29/2020   Lab Results  Component Value Date   VD25OH 60.6 11/29/2020   VD25OH 41.54 07/26/2020   VD25OH 44.6 04/17/2020   Lab Results  Component Value Date   WBC 6.9 02/03/2020   HGB 13.3 02/03/2020   HCT 41.1 02/03/2020   MCV 92.2 02/03/2020   PLT 270 02/03/2020   Lab Results  Component Value Date   IRON 43 08/18/2010   TIBC 389 08/18/2010   FERRITIN 227 08/18/2010    Obesity Behavioral Intervention:   Approximately 15 minutes were spent on the discussion below.  ASK: We discussed the diagnosis of obesity with [name] today and Bintu agreed to give [place] permission to discuss obesity behavioral modification therapy today.  ASSESS: Yalini has the diagnosis of obesity and her BMI today is 36.6. Abrie  is in the action stage of change.   ADVISE: Cambree was educated on the multiple health risks of obesity as well as the benefit of weight loss to improve her health. She was advised of the need for long term treatment and the importance of lifestyle modifications to improve her current health and to decrease her risk of future health problems.  AGREE: Multiple dietary modification options and treatment options were discussed and Jobi agreed to follow the recommendations documented in the above note.  ARRANGE: Kyndra was educated on the importance of frequent visits to treat obesity as outlined per CMS and USPSTF guidelines and agreed to schedule her next follow up appointment today.  Attestation Statements:   Reviewed by clinician on day of visit: allergies, medications, problem list, medical history, surgical history, family history, social history, and previous encounter notes.   Wilhemena Durie, am acting as transcriptionist for [employer], DO.  I have reviewed the above documentation for accuracy and completeness, and I agree with the above. Marjory Sneddon, D.O.  The [place] was signed into law in 2016 which includes the topic of electronic health records.  This provides immediate access to information in MyChart.  This includes consultation notes, operative notes, office notes, lab results and pathology reports.  If you have any questions about what you read please let us know at your next visit so we can discuss your concerns and take corrective action if need be.  We are right here with you.

## 2021-02-14 DIAGNOSIS — H524 Presbyopia: Secondary | ICD-10-CM | POA: Diagnosis not present

## 2021-02-14 DIAGNOSIS — H40009 Preglaucoma, unspecified, unspecified eye: Secondary | ICD-10-CM | POA: Diagnosis not present

## 2021-02-14 DIAGNOSIS — E119 Type 2 diabetes mellitus without complications: Secondary | ICD-10-CM | POA: Diagnosis not present

## 2021-02-19 DIAGNOSIS — H401133 Primary open-angle glaucoma, bilateral, severe stage: Secondary | ICD-10-CM | POA: Diagnosis not present

## 2021-02-20 DIAGNOSIS — H5213 Myopia, bilateral: Secondary | ICD-10-CM | POA: Diagnosis not present

## 2021-02-20 DIAGNOSIS — H52223 Regular astigmatism, bilateral: Secondary | ICD-10-CM | POA: Diagnosis not present

## 2021-02-21 ENCOUNTER — Other Ambulatory Visit: Payer: Self-pay

## 2021-02-21 ENCOUNTER — Ambulatory Visit (INDEPENDENT_AMBULATORY_CARE_PROVIDER_SITE_OTHER): Payer: Medicare HMO | Admitting: Family Medicine

## 2021-02-21 ENCOUNTER — Encounter (INDEPENDENT_AMBULATORY_CARE_PROVIDER_SITE_OTHER): Payer: Self-pay | Admitting: Family Medicine

## 2021-02-21 VITALS — BP 144/90 | HR 76 | Temp 97.8°F | Ht 65.0 in | Wt 220.0 lb

## 2021-02-21 DIAGNOSIS — E1169 Type 2 diabetes mellitus with other specified complication: Secondary | ICD-10-CM | POA: Diagnosis not present

## 2021-02-21 DIAGNOSIS — Z6837 Body mass index (BMI) 37.0-37.9, adult: Secondary | ICD-10-CM | POA: Diagnosis not present

## 2021-02-21 DIAGNOSIS — Z794 Long term (current) use of insulin: Secondary | ICD-10-CM | POA: Diagnosis not present

## 2021-02-21 DIAGNOSIS — E1159 Type 2 diabetes mellitus with other circulatory complications: Secondary | ICD-10-CM

## 2021-02-21 DIAGNOSIS — E559 Vitamin D deficiency, unspecified: Secondary | ICD-10-CM

## 2021-02-21 DIAGNOSIS — I152 Hypertension secondary to endocrine disorders: Secondary | ICD-10-CM

## 2021-02-21 MED ORDER — SEMAGLUTIDE (2 MG/DOSE) 8 MG/3ML ~~LOC~~ SOPN: PEN_INJECTOR | SUBCUTANEOUS | 0 refills | Status: DC

## 2021-02-21 MED ORDER — VITAMIN D (ERGOCALCIFEROL) 1.25 MG (50000 UNIT) PO CAPS: 50000.0000 [IU] | ORAL_CAPSULE | ORAL | 0 refills | Status: DC

## 2021-02-22 NOTE — Progress Notes (Signed)
Chief Complaint:   OBESITY Sarah [name] is here to discuss her progress with her obesity treatment plan along with follow-up of her obesity related diagnoses. Sarah [name] is on the Category 2 Plan and states she is following her eating plan approximately 90% of the time. Sarah [name] states she is Silver Sneakers 45 minutes 3 times per week and walking 30 minutes 2 times a week.  Today's visit was #: 25 Starting weight: 228 lbs Starting date: 02/15/2020 Today's weight: 220 lbs Today's date: 02/21/2021 Total lbs lost to date: 8 Total lbs lost since last in-office visit: +1  Interim History: Sarah [name] is here for a follow up office visit.  We reviewed her meal plan and questions were answered.  Patient's food recall appears to be accurate and consistent with what is on plan when she is following it.   When eating on plan, her hunger and cravings are well controlled.   Sarah [name] is frustrated that she's not losing weight but continues to skip foods/meals.  Subjective:   1. Type 2 diabetes mellitus with other specified complication, with long-term current use of insulin (HCC) Sarah [name] is tolerating medication(s) well without side effects.  Medication compliance is good and patient appears to be taking it as prescribed.  Denies additional concerns regarding this condition.   2. Vitamin D deficiency She is currently taking prescription vitamin D 50,000 IU each week. She denies nausea, vomiting or muscle weakness.  Lab Results  Component Value Date   VD25OH 60.6 11/29/2020   VD25OH 41.54 07/26/2020   VD25OH 44.6 04/17/2020   3. Hypertension associated with type 2 diabetes mellitus (Sarah [name]) Not at goal. BP at home was 137/79 a couple of days ago and runs right around there.   Assessment/Plan:   Orders Placed This Encounter  Procedures   Hemoglobin A1c   VITAMIN D 25 Hydroxy (Vit-D Deficiency, Fractures)    Medications Discontinued During This Encounter  Medication Reason   Semaglutide, 2  MG/DOSE, 8 MG/3ML SOPN Reorder   Vitamin D, Ergocalciferol, (DRISDOL) 1.25 MG (50000 UNIT) CAPS capsule Reorder     Meds ordered this encounter  Medications   Semaglutide, 2 MG/DOSE, 8 MG/3ML SOPN    Sig: '2mg'$  [place] q wk    Dispense:  3 mL    Refill:  0    Ov for RF   Vitamin D, Ergocalciferol, (DRISDOL) 1.25 MG (50000 UNIT) CAPS capsule    Sig: Take 1 capsule (50,000 Units total) by mouth every 7 (seven) days.    Dispense:  4 capsule    Refill:  0    30 d only- pt needs OV for RF     1. Type 2 diabetes mellitus with other specified complication, with long-term current use of insulin (HCC) Refill Ozempic as prescribed below. Good blood sugar control is important to decrease the likelihood of diabetic complications such as nephropathy, neuropathy, limb loss, blindness, coronary artery disease, and death. Intensive lifestyle modification including diet, exercise and weight loss are the first line of treatment for diabetes. Check A1c at next OV.  Refill- Semaglutide, 2 MG/DOSE, 8 MG/3ML SOPN; '2mg'$  [place] q wk  Dispense: 3 mL; Refill: 0  - Hemoglobin A1c  2. Vitamin D deficiency Refill Vit D 50K as prescribed below. Low Vitamin D level contributes to fatigue and are associated with obesity, breast, and colon cancer. She agrees to continue to take prescription Vitamin D 50,000 IU every week and will follow-up for routine testing of Vitamin D, at least 2-3  times per year to avoid over-replacement. Check labs at next OV.  Refill- Vitamin D, Ergocalciferol, (DRISDOL) 1.25 MG (50000 UNIT) CAPS capsule; Take 1 capsule (50,000 Units total) by mouth every 7 (seven) days.  Dispense: 4 capsule; Refill: 0  - VITAMIN D 25 Hydroxy (Vit-D Deficiency, Fractures)  3. Hypertension associated with type 2 diabetes mellitus (Sarah [name]) Sarah [name] is working on healthy weight loss and exercise to improve blood pressure control. We will watch for signs of hypotension as she continues her lifestyle modifications.  4. Obesity  with current BMI of 36.6  Sarah [name] is currently in the action stage of change. As such, her goal is to continue with weight loss efforts. She has agreed to keeping a food journal and adhering to recommended goals of 1250-1350 calories and 90+ grams protein.   Check labs at next OV.  Exercise goals:  As is  Behavioral modification strategies: increasing lean protein intake, decreasing simple carbohydrates, and planning for success.  Sarah [name] has agreed to follow-up with our clinic in 3 weeks. She was informed of the importance of frequent follow-up visits to maximize her success with intensive lifestyle modifications for her multiple health conditions.   Objective:   Blood pressure (!) 144/90, pulse 76, temperature 97.8 F (36.6 C), height '5\' 5"'$  (1.651 m), weight 220 lb (99.8 kg), SpO2 98 %. Body mass index is 36.61 kg/m.  General: Cooperative, alert, well developed, in no acute distress. HEENT: Conjunctivae and lids unremarkable. Cardiovascular: Regular rhythm.  Lungs: Normal work of breathing. Neurologic: No focal deficits.   Lab Results  Component Value Date   CREATININE 1.11 (H) 11/29/2020   BUN 22 11/29/2020   NA 140 11/29/2020   K 4.6 11/29/2020   CL 103 11/29/2020   CO2 19 (L) 11/29/2020   Lab Results  Component Value Date   ALT 33 (H) 11/29/2020   AST 29 11/29/2020   ALKPHOS 71 11/29/2020   BILITOT 0.6 11/29/2020   Lab Results  Component Value Date   HGBA1C 7.2 (H) 11/29/2020   HGBA1C 5.6 06/14/2020   HGBA1C 6.9 (H) 04/17/2020   HGBA1C 9.0 (H) 01/11/2020   HGBA1C 9.8 (A) 09/23/2019   No results found for: INSULIN Lab Results  Component Value Date   TSH 0.938 02/15/2020   Lab Results  Component Value Date   CHOL 145 11/29/2020   HDL 44 11/29/2020   LDLCALC 60 11/29/2020   TRIG 261 (H) 11/29/2020   CHOLHDL 3.3 11/29/2020   Lab Results  Component Value Date   VD25OH 60.6 11/29/2020   VD25OH 41.54 07/26/2020   VD25OH 44.6 04/17/2020   Lab Results   Component Value Date   WBC 6.9 02/03/2020   HGB 13.3 02/03/2020   HCT 41.1 02/03/2020   MCV 92.2 02/03/2020   PLT 270 02/03/2020   Lab Results  Component Value Date   IRON 43 08/18/2010   TIBC 389 08/18/2010   FERRITIN 227 08/18/2010    Obesity Behavioral Intervention:   Approximately 15 minutes were spent on the discussion below.  ASK: We discussed the diagnosis of obesity with Sarah [name] today and Sarah [name] agreed to give [place] permission to discuss obesity behavioral modification therapy today.  ASSESS: Sarah [name] has the diagnosis of obesity and her BMI today is 36.6. Amritha is in the action stage of change.   ADVISE: Sarah [name] was educated on the multiple health risks of obesity as well as the benefit of weight loss to improve her health. She was advised of the need for long term treatment  and the importance of lifestyle modifications to improve her current health and to decrease her risk of future health problems.  AGREE: Multiple dietary modification options and treatment options were discussed and Sarah [name] agreed to follow the recommendations documented in the above note.  ARRANGE: Sarah [name] was educated on the importance of frequent visits to treat obesity as outlined per CMS and USPSTF guidelines and agreed to schedule her next follow up appointment today.  Attestation Statements:   Reviewed by clinician on day of visit: allergies, medications, problem list, medical history, surgical history, family history, social history, and previous encounter notes.  Coral Ceo, CMA, am acting as transcriptionist for [employer], DO.  I have reviewed the above documentation for accuracy and completeness, and I agree with the above. Marjory Sneddon, D.O.  The [place] was signed into law in 2016 which includes the topic of electronic health records.  This provides immediate access to information in MyChart.  This includes consultation notes, operative notes, office  notes, lab results and pathology reports.  If you have any questions about what you read please let us know at your next visit so we can discuss your concerns and take corrective action if need be.  We are right here with you.

## 2021-03-03 ENCOUNTER — Other Ambulatory Visit (INDEPENDENT_AMBULATORY_CARE_PROVIDER_SITE_OTHER): Payer: Self-pay | Admitting: Family Medicine

## 2021-03-03 DIAGNOSIS — E559 Vitamin D deficiency, unspecified: Secondary | ICD-10-CM

## 2021-03-07 ENCOUNTER — Encounter (INDEPENDENT_AMBULATORY_CARE_PROVIDER_SITE_OTHER): Payer: Self-pay | Admitting: Family Medicine

## 2021-03-07 ENCOUNTER — Ambulatory Visit (INDEPENDENT_AMBULATORY_CARE_PROVIDER_SITE_OTHER): Payer: Medicare HMO | Admitting: Family Medicine

## 2021-03-07 ENCOUNTER — Other Ambulatory Visit: Payer: Self-pay

## 2021-03-07 VITALS — BP 148/82 | HR 91 | Temp 97.6°F | Ht 65.0 in | Wt 215.0 lb

## 2021-03-07 DIAGNOSIS — E1159 Type 2 diabetes mellitus with other circulatory complications: Secondary | ICD-10-CM

## 2021-03-07 DIAGNOSIS — F3289 Other specified depressive episodes: Secondary | ICD-10-CM

## 2021-03-07 DIAGNOSIS — Z6837 Body mass index (BMI) 37.0-37.9, adult: Secondary | ICD-10-CM | POA: Diagnosis not present

## 2021-03-07 DIAGNOSIS — E1169 Type 2 diabetes mellitus with other specified complication: Secondary | ICD-10-CM

## 2021-03-07 DIAGNOSIS — I152 Hypertension secondary to endocrine disorders: Secondary | ICD-10-CM

## 2021-03-07 DIAGNOSIS — Z794 Long term (current) use of insulin: Secondary | ICD-10-CM

## 2021-03-07 DIAGNOSIS — E559 Vitamin D deficiency, unspecified: Secondary | ICD-10-CM | POA: Diagnosis not present

## 2021-03-07 MED ORDER — BUPROPION HCL ER (SR) 150 MG PO TB12: 150.0000 mg | ORAL_TABLET | Freq: Every morning | ORAL | 0 refills | Status: DC

## 2021-03-07 MED ORDER — SEMAGLUTIDE (2 MG/DOSE) 8 MG/3ML ~~LOC~~ SOPN: PEN_INJECTOR | SUBCUTANEOUS | 0 refills | Status: DC

## 2021-03-07 MED ORDER — VITAMIN D (ERGOCALCIFEROL) 1.25 MG (50000 UNIT) PO CAPS: 50000.0000 [IU] | ORAL_CAPSULE | ORAL | 0 refills | Status: DC

## 2021-03-07 NOTE — Progress Notes (Signed)
Chief Complaint:   OBESITY Sarah [name] is here to discuss her progress with her obesity treatment plan along with follow-up of her obesity related diagnoses. Sarah [name] is on keeping a food journal and adhering to recommended goals of 1250-1350 calories and 90+ grams of protein daily and states she is following her eating plan approximately 100% of the time. Sarah [name] states she is doing an exercise class and walking for 30-50 minutes 5 times per week.  Today's visit was #: 26 Starting weight: 228 lbs Starting date: 02/15/2020 Today's weight: 215 lbs Today's date: 03/07/2021 Total lbs lost to date: 13 Total lbs lost since last in-office visit: 5  Interim History: Sarah [name] notes that she is great, and she has logged in everything and was accountable. She is using Lose It. She hit her calorie goals most of the time and her protein goals 60% of the time. She is still eating a lot of pre-packaged, carb rich foods.  Subjective:   1. Type 2 diabetes mellitus with other specified complication, with long-term current use of insulin (HCC) Sarah [name]'s fasting blood sugars have much improved at 109 and 97. She denies unstable ones, and her highest is 130.  2. Hypertension associated with type 2 diabetes mellitus ([place]) Sarah [name]'s blood pressure is still not at goal. She is not checking her blood pressure at home but she has a cuff. She denies concerns or new symptoms.  3. Vitamin D deficiency Sarah [name] is taking prescription Vitamin D 50,000 IU weekly. She denies nausea, vomiting, or muscle weakness.  4. Other depression with emotional eating Sarah [name] is struggling with emotional eating and using food for comfort to the extent that it is negatively impacting her health. She has been working on behavior modification techniques to help reduce her emotional eating and has been somewhat successful. Her mood is stable, and she shows no sign of suicidal or homicidal ideations.  Assessment/Plan:   Orders Placed This  Encounter  Procedures   Comprehensive metabolic panel   Hemoglobin A1c   VITAMIN D 25 Hydroxy (Vit-D Deficiency, Fractures)    Medications Discontinued During This Encounter  Medication Reason   buPROPion (WELLBUTRIN SR) 150 MG 12 hr tablet Reorder   Semaglutide, 2 MG/DOSE, 8 MG/3ML SOPN Reorder   Vitamin D, Ergocalciferol, (DRISDOL) 1.25 MG (50000 UNIT) CAPS capsule Reorder     Meds ordered this encounter  Medications   buPROPion (WELLBUTRIN SR) 150 MG 12 hr tablet    Sig: Take 1 tablet (150 mg total) by mouth in the morning.    Dispense:  30 tablet    Refill:  0   Semaglutide, 2 MG/DOSE, 8 MG/3ML SOPN    Sig: 2mg  Sarah [name] q wk    Dispense:  3 mL    Refill:  0    Ov for RF   Vitamin D, Ergocalciferol, (DRISDOL) 1.25 MG (50000 UNIT) CAPS capsule    Sig: Take 1 capsule (50,000 Units total) by mouth every 7 (seven) days.    Dispense:  4 capsule    Refill:  0    30 d only- pt needs OV for RF     1. Type 2 diabetes mellitus with other specified complication, with long-term current use of insulin (HCC) Sarah [name] will continue metformin and we will refill Semaglutide for 1 month. She will decrease her insulin by 2-3 units every 3-4 days or as needed. We will check A1c today. Good blood sugar control is important to decrease the likelihood of diabetic complications such as nephropathy, neuropathy,  limb loss, blindness, coronary artery disease, and death. Intensive lifestyle modification including diet, exercise and weight loss are the first line of treatment for diabetes.   - Semaglutide, 2 MG/DOSE, 8 MG/3ML SOPN; 2mg  Sarah [name] q wk  Dispense: 3 mL; Refill: 0  2. Hypertension associated with type 2 diabetes mellitus ([place]) Sarah [name] will decrease her salt and less pre-packaged food. She will continue her prudent nutritional plan and weight loss. We will check CMP today. Sarah [name] is to bring in her blood pressure log to her next office visit for my review.   - Comprehensive metabolic panel  3. Vitamin D  deficiency Low Vitamin D level contributes to fatigue and are associated with obesity, breast, and colon cancer. We will check Vit D level today, and we will refill prescription Vitamin D for 1 month. Sarah [name] will follow-up for routine testing of Vitamin D, at least 2-3 times per year to avoid over-replacement.  - Vitamin D, Ergocalciferol, (DRISDOL) 1.25 MG (50000 UNIT) CAPS capsule; Take 1 capsule (50,000 Units total) by mouth every 7 (seven) days.  Dispense: 4 capsule; Refill: 0  4. Other depression with emotional eating Behavior modification techniques were discussed today to help Sarah [name] deal with her emotional/non-hunger eating behaviors. We will refill Wellbutrin SR for 1 month. Orders and follow up as documented in patient record.   - buPROPion (WELLBUTRIN SR) 150 MG 12 hr tablet; Take 1 tablet (150 mg total) by mouth in the morning.  Dispense: 30 tablet; Refill: 0  5. Obesity with current BMI of 35.8 Sarah [name] is currently in the action stage of change. As such, her goal is to continue with weight loss efforts. She has agreed to keeping a food journal and adhering to recommended goals of 1250-1350 calories and 90+ grams of protein daily.   Sarah [name] is to bring in her journal to her next office visit.  Exercise goals: As is.  Behavioral modification strategies: increasing lean protein intake and decreasing simple carbohydrates.  Sarah [name] has agreed to follow-up with our clinic in 3 weeks. She was informed of the importance of frequent follow-up visits to maximize her success with intensive lifestyle modifications for her multiple health conditions.   Objective:   Blood pressure (!) 148/82, pulse 91, temperature 97.6 F (36.4 C), height 5\' 5"  (1.651 m), weight 215 lb (97.5 kg), SpO2 94 %. Body mass index is 35.78 kg/m.  General: Cooperative, alert, well developed, in no acute distress. HEENT: Conjunctivae and lids unremarkable. Cardiovascular: Regular rhythm.  Lungs: Normal work of  breathing. Neurologic: No focal deficits.   Lab Results  Component Value Date   CREATININE 1.11 (H) 11/29/2020   BUN 22 11/29/2020   NA 140 11/29/2020   K 4.6 11/29/2020   CL 103 11/29/2020   CO2 19 (L) 11/29/2020   Lab Results  Component Value Date   ALT 33 (H) 11/29/2020   AST 29 11/29/2020   ALKPHOS 71 11/29/2020   BILITOT 0.6 11/29/2020   Lab Results  Component Value Date   HGBA1C 7.2 (H) 11/29/2020   HGBA1C 5.6 06/14/2020   HGBA1C 6.9 (H) 04/17/2020   HGBA1C 9.0 (H) 01/11/2020   HGBA1C 9.8 (A) 09/23/2019   No results found for: INSULIN Lab Results  Component Value Date   TSH 0.938 02/15/2020   Lab Results  Component Value Date   CHOL 145 11/29/2020   HDL 44 11/29/2020   LDLCALC 60 11/29/2020   TRIG 261 (H) 11/29/2020   CHOLHDL 3.3 11/29/2020   Lab Results  Component Value  Date   VD25OH 60.6 11/29/2020   VD25OH 41.54 07/26/2020   VD25OH 44.6 04/17/2020   Lab Results  Component Value Date   WBC 6.9 02/03/2020   HGB 13.3 02/03/2020   HCT 41.1 02/03/2020   MCV 92.2 02/03/2020   PLT 270 02/03/2020   Lab Results  Component Value Date   IRON 43 08/18/2010   TIBC 389 08/18/2010   FERRITIN 227 08/18/2010    Obesity Behavioral Intervention:   Approximately 15 minutes were spent on the discussion below.  ASK: We discussed the diagnosis of obesity with Sarah [name] today and Sarah [name] agreed to give [place] permission to discuss obesity behavioral modification therapy today.  ASSESS: Sarah [name] has the diagnosis of obesity and her BMI today is 35.8. Sarah [name] is in the action stage of change.   ADVISE: Sarah [name] was educated on the multiple health risks of obesity as well as the benefit of weight loss to improve her health. She was advised of the need for long term treatment and the importance of lifestyle modifications to improve her current health and to decrease her risk of future health problems.  AGREE: Multiple dietary modification options and treatment options were  discussed and Manami agreed to follow the recommendations documented in the above note.  ARRANGE: Shelisa was educated on the importance of frequent visits to treat obesity as outlined per CMS and USPSTF guidelines and agreed to schedule her next follow up appointment today.  Attestation Statements:   Reviewed by clinician on day of visit: allergies, medications, problem list, medical history, surgical history, family history, social history, and previous encounter notes.   Sarah [name], am acting as transcriptionist for [employer], DO.  I have reviewed the above documentation for accuracy and completeness, and I agree with the above. Sarah [name], D.O.  The [place] was signed into law in 2016 which includes the topic of electronic health records.  This provides immediate access to information in MyChart.  This includes consultation notes, operative notes, office notes, lab results and pathology reports.  If you have any questions about what you read please let us know at your next visit so we can discuss your concerns and take corrective action if need be.  We are right here with you.

## 2021-03-08 LAB — COMPREHENSIVE METABOLIC PANEL
ALT: 23 IU/L (ref 0–32)
AST: 24 IU/L (ref 0–40)
Albumin/Globulin Ratio: 1.7 (ref 1.2–2.2)
Albumin: 4.7 g/dL (ref 3.8–4.9)
Alkaline Phosphatase: 68 IU/L (ref 44–121)
BUN/Creatinine Ratio: 13 (ref 9–23)
BUN: 17 mg/dL (ref 6–24)
Bilirubin Total: 0.6 mg/dL (ref 0.0–1.2)
CO2: 19 mmol/L — ABNORMAL LOW (ref 20–29)
Calcium: 12.3 mg/dL — ABNORMAL HIGH (ref 8.7–10.2)
Chloride: 104 mmol/L (ref 96–106)
Creatinine, Ser: 1.28 mg/dL — ABNORMAL HIGH (ref 0.57–1.00)
Globulin, Total: 2.7 g/dL (ref 1.5–4.5)
Glucose: 114 mg/dL — ABNORMAL HIGH (ref 70–99)
Potassium: 4.7 mmol/L (ref 3.5–5.2)
Sodium: 141 mmol/L (ref 134–144)
Total Protein: 7.4 g/dL (ref 6.0–8.5)
eGFR: 49 mL/min/{1.73_m2} — ABNORMAL LOW (ref >59)

## 2021-03-08 LAB — HEMOGLOBIN A1C
Est. average glucose Bld gHb Est-mCnc: 154 mg/dL
Hgb A1c MFr Bld: 7 % — ABNORMAL HIGH (ref 4.8–5.6)

## 2021-03-08 LAB — VITAMIN D 25 HYDROXY (VIT D DEFICIENCY, FRACTURES): Vit D, 25-Hydroxy: 81.1 ng/mL (ref 30.0–100.0)

## 2021-03-13 ENCOUNTER — Encounter (INDEPENDENT_AMBULATORY_CARE_PROVIDER_SITE_OTHER): Payer: Self-pay | Admitting: Family Medicine

## 2021-03-14 NOTE — Telephone Encounter (Signed)
Last OV with Dr Opalski 

## 2021-03-19 ENCOUNTER — Other Ambulatory Visit: Payer: Medicare HMO

## 2021-03-19 NOTE — Telephone Encounter (Signed)
Can I send this in to [facility]? The pharmacy that it was sent in to on 9/28 did not have it in stock.

## 2021-03-19 NOTE — Telephone Encounter (Signed)
yes

## 2021-03-20 ENCOUNTER — Other Ambulatory Visit: Payer: Self-pay

## 2021-03-20 ENCOUNTER — Ambulatory Visit
Admission: RE | Admit: 2021-03-20 | Discharge: 2021-03-20 | Disposition: A | Discharge status: Home / Self Care | Payer: Medicare HMO | Source: Ambulatory Visit | Attending: Internal Medicine | Admitting: Internal Medicine

## 2021-03-20 DIAGNOSIS — N6310 Unspecified lump in the right breast, unspecified quadrant: Secondary | ICD-10-CM

## 2021-03-20 DIAGNOSIS — R922 Inconclusive mammogram: Secondary | ICD-10-CM | POA: Diagnosis not present

## 2021-03-21 ENCOUNTER — Other Ambulatory Visit (INDEPENDENT_AMBULATORY_CARE_PROVIDER_SITE_OTHER): Payer: Self-pay

## 2021-03-21 DIAGNOSIS — Z794 Long term (current) use of insulin: Secondary | ICD-10-CM

## 2021-03-21 MED ORDER — SEMAGLUTIDE (2 MG/DOSE) 8 MG/3ML ~~LOC~~ SOPN: PEN_INJECTOR | SUBCUTANEOUS | 0 refills | Status: DC

## 2021-03-28 ENCOUNTER — Other Ambulatory Visit: Payer: Self-pay

## 2021-03-28 ENCOUNTER — Other Ambulatory Visit (HOSPITAL_COMMUNITY): Payer: Self-pay

## 2021-03-28 ENCOUNTER — Encounter (INDEPENDENT_AMBULATORY_CARE_PROVIDER_SITE_OTHER): Payer: Self-pay | Admitting: Family Medicine

## 2021-03-28 ENCOUNTER — Ambulatory Visit (INDEPENDENT_AMBULATORY_CARE_PROVIDER_SITE_OTHER): Payer: Medicare HMO | Admitting: Family Medicine

## 2021-03-28 ENCOUNTER — Other Ambulatory Visit: Payer: Self-pay | Admitting: Internal Medicine

## 2021-03-28 VITALS — BP 133/83 | HR 67 | Temp 97.4°F | Ht 65.0 in | Wt 214.0 lb

## 2021-03-28 DIAGNOSIS — Z6837 Body mass index (BMI) 37.0-37.9, adult: Secondary | ICD-10-CM | POA: Diagnosis not present

## 2021-03-28 DIAGNOSIS — J302 Other seasonal allergic rhinitis: Secondary | ICD-10-CM

## 2021-03-28 DIAGNOSIS — E1169 Type 2 diabetes mellitus with other specified complication: Secondary | ICD-10-CM

## 2021-03-28 DIAGNOSIS — F3289 Other specified depressive episodes: Secondary | ICD-10-CM

## 2021-03-28 DIAGNOSIS — N183 Chronic kidney disease, stage 3 unspecified: Secondary | ICD-10-CM

## 2021-03-28 DIAGNOSIS — E559 Vitamin D deficiency, unspecified: Secondary | ICD-10-CM | POA: Diagnosis not present

## 2021-03-28 DIAGNOSIS — Z794 Long term (current) use of insulin: Secondary | ICD-10-CM

## 2021-03-28 MED ORDER — SEMAGLUTIDE (2 MG/DOSE) 8 MG/3ML ~~LOC~~ SOPN: PEN_INJECTOR | SUBCUTANEOUS | 0 refills | Status: DC

## 2021-03-28 MED ORDER — BUPROPION HCL ER (SR) 150 MG PO TB12: 150.0000 mg | ORAL_TABLET | Freq: Every morning | ORAL | 0 refills | Status: DC

## 2021-03-28 MED ORDER — LEVOCETIRIZINE DIHYDROCHLORIDE 5 MG PO TABS: 5.0000 mg | ORAL_TABLET | Freq: Every evening | ORAL | 0 refills | Status: DC

## 2021-03-28 MED ORDER — MONTELUKAST SODIUM 10 MG PO TABS: 10.0000 mg | ORAL_TABLET | Freq: Every day | ORAL | 0 refills | Status: DC

## 2021-03-28 MED ORDER — VITAMIN D (ERGOCALCIFEROL) 1.25 MG (50000 UNIT) PO CAPS: ORAL_CAPSULE | ORAL | 0 refills | Status: DC

## 2021-03-28 MED ORDER — SEMAGLUTIDE (2 MG/DOSE) 8 MG/3ML ~~LOC~~ SOPN
PEN_INJECTOR | SUBCUTANEOUS | 0 refills | Status: DC
  Filled 2021-03-28: qty 3, 28d supply, fill #0

## 2021-03-28 NOTE — Telephone Encounter (Signed)
Requested Prescriptions  Pending Prescriptions Disp Refills  . DROPLET PEN NEEDLES 29G X 12MM MISC [Pharmacy Med Name: DROPLET PEN NEEDLES 29GX12MM 29G X 12MM] 100 each 0    Sig: USE AS DIRECTED     Endocrinology: Diabetes - Testing Supplies Passed - 03/28/2021  4:00 AM      Passed - Valid encounter within last 12 months    Recent Outpatient Visits          2 months ago Type 2 diabetes mellitus with obesity ([place])   [place] Karle Plumber B, MD   2 months ago Cutaneous abscess of back excluding buttocks   [place], [place], NP   6 months ago Type 2 diabetes mellitus with obesity [facility])   [place], MD   11 months ago Need for shingles vaccine   [place], Stephen L, RPH-CPP   11 months ago Type 2 diabetes mellitus with obesity [facility])   [place], MD      Future Appointments            In 2 months Wynetta Emery Dalbert Batman, MD [place]

## 2021-03-28 NOTE — Progress Notes (Signed)
Chief Complaint:   OBESITY Sarah [name] is here to discuss her progress with her obesity treatment plan along with follow-up of her obesity related diagnoses. Sarah [name] is on the Category 2 Plan and keeping a food journal and adhering to recommended goals of 1250-1350 calories and 90 grams protein and states she is following her eating plan approximately 90% of the time. Sarah [name] states she is doing aerobics and walking 45-50 minutes 3 times per week.  Today's visit was #: 38 Starting weight: 228 lbs Starting date: 02/15/2020 Today's weight: 214 lbs Today's date: 03/28/2021 Total lbs lost to date: 14 Total lbs lost since last in-office visit: 1  Interim History: Sarah [name] has been eating out more and not tracking accurately. She is eating too many calories and very low on proteins. She has been out of Sarah [name] for 2-3 weeks.   Subjective:   1. Type 2 diabetes mellitus with other specified complication, with long-term current use of insulin (Sarah [name]) Discussed labs with patient today. Pt's A1c is 7.0 recently, which is slightly down from prior check. Medication: Metformin, Lantus, Ozempic   Lab Results  Component Value Date   HGBA1C 7.0 (H) 03/07/2021   HGBA1C 7.2 (H) 11/29/2020   HGBA1C 5.6 06/14/2020   Lab Results  Component Value Date   MICROALBUR 80 11/13/2016   LDLCALC 60 11/29/2020   CREATININE 1.28 (H) 03/07/2021   2. Stage 3 chronic kidney disease, unspecified whether stage 3a or 3b CKD (Sarah [name]) Discussed labs with patient today. Worsening serum creatinine with a drop in GFR.   Lab Results  Component Value Date   CREATININE 1.28 (H) 03/07/2021   CREATININE 1.11 (H) 11/29/2020   CREATININE 1.06 (H) 04/17/2020   Lab Results  Component Value Date   CREATININE 1.28 (H) 03/07/2021   BUN 17 03/07/2021   NA 141 03/07/2021   K 4.7 03/07/2021   CL 104 03/07/2021   CO2 19 (L) 03/07/2021   3. Seasonal allergies Discussed labs with patient today. Sarah [name] is taking Singulair and  Xyzal and tolerating them well. She reports meds are working well.  4. Hypercalcemia Worsening. Discussed labs with patient today. Pt has seen endocrinology and was told no objective reason for high calcium levels. Vitamin D supplements were not a concern, per pt.   5. Vitamin D deficiency Discussed labs with patient today. Pt's Vit D level is high normal range. She is taking prescription Vit D and tolerating it well. No concerns from endocrinology regarding elevated calcium levels.  6. Other depression with emotional eating Discussed labs with patient today. Stable. Sarah [name] is struggling with emotional eating and using food for comfort to the extent that it is negatively impacting her health. She has been working on behavior modification techniques to help reduce her emotional eating. She shows no sign of suicidal or homicidal ideations.  Assessment/Plan:  No orders of the defined types were placed in this encounter.   Medications Discontinued During This Encounter  Medication Reason   montelukast (SINGULAIR) 10 MG tablet Reorder   levocetirizine (XYZAL) 5 MG tablet Reorder   buPROPion (WELLBUTRIN SR) 150 MG 12 hr tablet Reorder   Vitamin D, Ergocalciferol, (DRISDOL) 1.25 MG (50000 UNIT) CAPS capsule Reorder   Sarah [name], 2 MG/DOSE, 8 MG/3ML SOPN Reorder   Sarah [name], 2 MG/DOSE, 8 MG/3ML SOPN    Sarah [name], 2 MG/DOSE, 8 MG/3ML SOPN      Meds ordered this encounter  Medications   buPROPion (WELLBUTRIN SR) 150 MG 12 hr tablet    Sig:  Take 1 tablet (150 mg total) by mouth in the morning.    Dispense:  30 tablet    Refill:  0   levocetirizine (XYZAL) 5 MG tablet    Sig: Take 1 tablet (5 mg total) by mouth every evening.    Dispense:  30 tablet    Refill:  0   montelukast (SINGULAIR) 10 MG tablet    Sig: Take 1 tablet (10 mg total) by mouth at bedtime.    Dispense:  30 tablet    Refill:  0   DISCONTD: Sarah [name], 2 MG/DOSE, 8 MG/3ML SOPN    Sig: 2mg  [place] q wk    Dispense:  3  mL    Refill:  0    Ov for RF; 30 d supply.   *If there is no more of the 2mg  doses due to supply issues - please provided pt with double doses of the 1mg  pens.   Vitamin D, Ergocalciferol, (DRISDOL) 1.25 MG (50000 UNIT) CAPS capsule    Sig: 1 po q 10 days    Dispense:  3 capsule    Refill:  0    30 d only- pt needs OV for RF   DISCONTD: Sarah [name], 2 MG/DOSE, 8 MG/3ML SOPN    Sig: 2mg  [place] q wk    Dispense:  3 mL    Refill:  0    Ov for RF; 30 d supply.   *If there is no more of the 2mg  doses due to supply issues - please provided pt with double doses of the 1mg  pens.   Sarah [name], 2 MG/DOSE, 8 MG/3ML SOPN    Sig: Inject 2mg  under the skin once a week    Dispense:  3 mL    Refill:  0    Ov for RF; 30 d supply.   *If there is no more of the 2mg  doses due to supply issues - please provided pt with double doses of the 1mg  pens.     1. Type 2 diabetes mellitus with other specified complication, with long-term current use of insulin (Sarah [name]) Good blood sugar control is important to decrease the likelihood of diabetic complications such as nephropathy, neuropathy, limb loss, blindness, coronary artery disease, and death. Intensive lifestyle modification including diet, exercise and weight loss are the first line of treatment for diabetes. Continue Ozempic 2 mg. Continue other meds per PCP.  Refill- Sarah [name], 2 MG/DOSE, 8 MG/3ML SOPN; Inject 2mg  under the skin once a week  Dispense: 3 mL; Refill: 0  2. Stage 3 chronic kidney disease, unspecified whether stage 3a or 3b CKD (Sarah [name]) Counseling done with pt. Continue ARB and continue to increase exercise, increase water intake, and eat healthy.  3. Seasonal allergies Continue current treatment plan.  Refill- levocetirizine (XYZAL) 5 MG tablet; Take 1 tablet (5 mg total) by mouth every evening.  Dispense: 30 tablet; Refill: 0 Refill- montelukast (SINGULAIR) 10 MG tablet; Take 1 tablet (10 mg total) by mouth at bedtime.  Dispense: 30 tablet;  Refill: 0  4. Hypercalcemia Continue to follow up with endocrinology for high levels, but also decrease Vit D from every 7 days to every 10 days.  5. Vitamin D deficiency Low Vitamin D level contributes to fatigue and are associated with obesity, breast, and colon cancer. She agrees to continue prescription Vitamin D 50,000 IU but decrease from every 7 days to every 10 days and will follow-up for routine testing of Vitamin D, at least 2-3 times per year to avoid over-replacement.  Decrease &  Refill- Vitamin D, Ergocalciferol, (DRISDOL) 1.25 MG (50000 UNIT) CAPS capsule; 1 po q 10 days  Dispense: 3 capsule; Refill: 0  6. Other depression with emotional eating Behavior modification techniques were discussed today to help Sarah [name] deal with her emotional/non-hunger eating behaviors.  Orders and follow up as documented in patient record.   Refill- buPROPion (WELLBUTRIN SR) 150 MG 12 hr tablet; Take 1 tablet (150 mg total) by mouth in the morning.  Dispense: 30 tablet; Refill: 0  7. Obesity with current BMI of 35.6  Sarah [name] is currently in the action stage of change. As such, her goal is to continue with weight loss efforts. She has agreed to keeping a food journal and adhering to recommended goals of 1250-1350 calories and 90 g protein.   Demonstration provided on how to accurately input foods into Lose It app.  Exercise goals: For substantial health benefits, adults should do at least 150 minutes (2 hours and 30 minutes) a week of moderate-intensity, or 75 minutes (1 hour and 15 minutes) a week of vigorous-intensity aerobic physical activity, or an equivalent combination of moderate- and vigorous-intensity aerobic activity. Aerobic activity should be performed in episodes of at least 10 minutes, and preferably, it should be spread throughout the week.  Behavioral modification strategies: increasing lean protein intake, decreasing simple carbohydrates, decreasing eating out, planning for success, and  keeping a strict food journal.  Sarah [name] has agreed to follow-up with our clinic in 2-3 weeks. She was informed of the importance of frequent follow-up visits to maximize her success with intensive lifestyle modifications for her multiple health conditions.   Objective:   Blood pressure 133/83, pulse 67, temperature (!) 97.4 F (36.3 C), height 5\' 5"  (1.651 m), weight 214 lb (97.1 kg), SpO2 99 %. Body mass index is 35.61 kg/m.  General: Cooperative, alert, well developed, in no acute distress. HEENT: Conjunctivae and lids unremarkable. Cardiovascular: Regular rhythm.  Lungs: Normal work of breathing. Neurologic: No focal deficits.   Lab Results  Component Value Date   CREATININE 1.28 (H) 03/07/2021   BUN 17 03/07/2021   NA 141 03/07/2021   K 4.7 03/07/2021   CL 104 03/07/2021   CO2 19 (L) 03/07/2021   Lab Results  Component Value Date   ALT 23 03/07/2021   AST 24 03/07/2021   ALKPHOS 68 03/07/2021   BILITOT 0.6 03/07/2021   Lab Results  Component Value Date   HGBA1C 7.0 (H) 03/07/2021   HGBA1C 7.2 (H) 11/29/2020   HGBA1C 5.6 06/14/2020   HGBA1C 6.9 (H) 04/17/2020   HGBA1C 9.0 (H) 01/11/2020   No results found for: INSULIN Lab Results  Component Value Date   TSH 0.938 02/15/2020   Lab Results  Component Value Date   CHOL 145 11/29/2020   HDL 44 11/29/2020   LDLCALC 60 11/29/2020   TRIG 261 (H) 11/29/2020   CHOLHDL 3.3 11/29/2020   Lab Results  Component Value Date   VD25OH 81.1 03/07/2021   VD25OH 60.6 11/29/2020   VD25OH 41.54 07/26/2020   Lab Results  Component Value Date   WBC 6.9 02/03/2020   HGB 13.3 02/03/2020   HCT 41.1 02/03/2020   MCV 92.2 02/03/2020   PLT 270 02/03/2020   Lab Results  Component Value Date   IRON 43 08/18/2010   TIBC 389 08/18/2010   FERRITIN 227 08/18/2010    Obesity Behavioral Intervention:   Approximately 15 minutes were spent on the discussion below.  ASK: We discussed the diagnosis of obesity with Sarah [name] today  and Sarah [name] agreed to give [place] permission to discuss obesity behavioral modification therapy today.  ASSESS: Sarah [name] has the diagnosis of obesity and her BMI today is 35.6. Sarah [name] is in the action stage of change.   ADVISE: Sarah [name] was educated on the multiple health risks of obesity as well as the benefit of weight loss to improve her health. She was advised of the need for long term treatment and the importance of lifestyle modifications to improve her current health and to decrease her risk of future health problems.  AGREE: Multiple dietary modification options and treatment options were discussed and Sarah [name] agreed to follow the recommendations documented in the above note.  ARRANGE: Sarah [name] was educated on the importance of frequent visits to treat obesity as outlined per CMS and USPSTF guidelines and agreed to schedule her next follow up appointment today.  Attestation Statements:   Reviewed by clinician on day of visit: allergies, medications, problem list, medical history, surgical history, family history, social history, and previous encounter notes.  Sarah [name], CMA, am acting as transcriptionist for [employer], DO.  I have reviewed the above documentation for accuracy and completeness, and I agree with the above. Sarah [name], D.O.  The [place] was signed into law in 2016 which includes the topic of electronic health records.  This provides immediate access to information in MyChart.  This includes consultation notes, operative notes, office notes, lab results and pathology reports.  If you have any questions about what you read please let us know at your next visit so we can discuss your concerns and take corrective action if need be.  We are right here with you.

## 2021-03-29 NOTE — Telephone Encounter (Signed)
Dr.Opalski ?

## 2021-04-03 ENCOUNTER — Ambulatory Visit: Payer: Medicare HMO | Attending: Internal Medicine

## 2021-04-03 ENCOUNTER — Other Ambulatory Visit (HOSPITAL_BASED_OUTPATIENT_CLINIC_OR_DEPARTMENT_OTHER): Payer: Self-pay

## 2021-04-03 DIAGNOSIS — Z23 Encounter for immunization: Secondary | ICD-10-CM

## 2021-04-03 MED ORDER — PFIZER COVID-19 VAC BIVALENT 30 MCG/0.3ML IM SUSP
INTRAMUSCULAR | 0 refills | Status: DC
  Filled 2021-04-03: qty 0.3, 1d supply, fill #0

## 2021-04-03 NOTE — Progress Notes (Signed)
   Covid-19 Vaccination Clinic  Name:  Sarah [name]    MRN: [id number] DOB: Oct 02, 1965  04/03/2021  [name] was observed post Covid-19 immunization for 15 minutes without incident. She was provided with Vaccine Information Sheet and instruction to access the V-Safe system.   [name] was instructed to call 911 with any severe reactions post vaccine: Difficulty breathing  Swelling of face and throat  A fast heartbeat  A bad rash all over body  Dizziness and weakness   Immunizations Administered     Name Date Dose VIS Date Route   Pfizer Covid-19 Vaccine Bivalent Booster 04/03/2021  9:40 AM 0.3 mL 02/07/2021 Intramuscular   Manufacturer: [place]   Lot: [id number]   [place]: [phone]

## 2021-04-13 ENCOUNTER — Other Ambulatory Visit: Payer: Self-pay | Admitting: Internal Medicine

## 2021-04-13 DIAGNOSIS — I10 Essential (primary) hypertension: Secondary | ICD-10-CM

## 2021-04-13 NOTE — Telephone Encounter (Signed)
Requested Prescriptions  Pending Prescriptions Disp Refills  . metoprolol tartrate (LOPRESSOR) 25 MG tablet [Pharmacy Med Name: METOPROLOL TARTRATE 25 MG Tablet] 90 tablet 0    Sig: TAKE 1/2 TABLET TWICE DAILY     Cardiovascular:  Beta Blockers Passed - 04/13/2021  3:40 AM      Passed - Last BP in normal range    BP Readings from Last 1 Encounters:  03/28/21 133/83         Passed - Last Heart Rate in normal range    Pulse Readings from Last 1 Encounters:  03/28/21 67         Passed - Valid encounter within last 6 months    Recent Outpatient Visits          2 months ago Type 2 diabetes mellitus with obesity ([place])   [place] Karle Plumber B, MD   3 months ago Cutaneous abscess of back excluding buttocks   [place], [place], NP   7 months ago Type 2 diabetes mellitus with obesity [facility])   [place], MD   11 months ago Need for shingles vaccine   [place], Stephen L, RPH-CPP   11 months ago Type 2 diabetes mellitus with obesity [facility])   [place], MD      Future Appointments            In 1 month Wynetta Emery, Dalbert Batman, MD [place]

## 2021-04-18 ENCOUNTER — Other Ambulatory Visit (HOSPITAL_COMMUNITY): Payer: Self-pay

## 2021-04-18 ENCOUNTER — Ambulatory Visit: Payer: Self-pay | Admitting: General Surgery

## 2021-04-18 ENCOUNTER — Other Ambulatory Visit: Payer: Self-pay

## 2021-04-18 ENCOUNTER — Ambulatory Visit (INDEPENDENT_AMBULATORY_CARE_PROVIDER_SITE_OTHER): Payer: Medicare HMO | Admitting: Family Medicine

## 2021-04-18 ENCOUNTER — Encounter (INDEPENDENT_AMBULATORY_CARE_PROVIDER_SITE_OTHER): Payer: Self-pay | Admitting: Family Medicine

## 2021-04-18 VITALS — BP 117/76 | HR 72 | Temp 98.1°F | Ht 65.0 in | Wt 212.0 lb

## 2021-04-18 DIAGNOSIS — Z794 Long term (current) use of insulin: Secondary | ICD-10-CM | POA: Diagnosis not present

## 2021-04-18 DIAGNOSIS — J302 Other seasonal allergic rhinitis: Secondary | ICD-10-CM

## 2021-04-18 DIAGNOSIS — Z6837 Body mass index (BMI) 37.0-37.9, adult: Secondary | ICD-10-CM

## 2021-04-18 DIAGNOSIS — E559 Vitamin D deficiency, unspecified: Secondary | ICD-10-CM

## 2021-04-18 DIAGNOSIS — K432 Incisional hernia without obstruction or gangrene: Secondary | ICD-10-CM | POA: Diagnosis not present

## 2021-04-18 DIAGNOSIS — E1169 Type 2 diabetes mellitus with other specified complication: Secondary | ICD-10-CM

## 2021-04-18 MED ORDER — SEMAGLUTIDE (2 MG/DOSE) 8 MG/3ML ~~LOC~~ SOPN
PEN_INJECTOR | SUBCUTANEOUS | 0 refills | Status: DC
  Filled 2021-04-18: qty 3, 28d supply, fill #0

## 2021-04-18 MED ORDER — MONTELUKAST SODIUM 10 MG PO TABS: 10.0000 mg | ORAL_TABLET | Freq: Every day | ORAL | 0 refills | Status: DC

## 2021-04-18 MED ORDER — LEVOCETIRIZINE DIHYDROCHLORIDE 5 MG PO TABS: 5.0000 mg | ORAL_TABLET | Freq: Every evening | ORAL | 0 refills | Status: DC

## 2021-04-18 MED ORDER — VITAMIN D (ERGOCALCIFEROL) 1.25 MG (50000 UNIT) PO CAPS: ORAL_CAPSULE | ORAL | 0 refills | Status: DC

## 2021-04-18 NOTE — H&P (Signed)
Chief Complaint: Follow-up °  °  °  °History of Present Illness: °Sarah [name] is a 55 y.o. female who is seen today as an office consultation at the request of Dr. None for evaluation of Follow-up °.   °Patient is a 55-year-old female with a history of diabetes, hypertension who comes in secondary to a recurrent incisional hernia. °Patient previously had ovarian cancer status post ex lap, BSO, omentectomy, appendectomy in 2016.  Patient had a metastasis involving the liver.  She was stage IIIc.  Patient underwent chemotherapy.  At that time patient underwent retrorectus repair with phasic's mesh.  This was placed in the preperitoneal space.  Patient states that subsequently approximately 1 year after surgery did hernia recurred. °  °Patient states that she is continue to had some discomfort with the hernia. °  °Patient's had ongoing weight loss and has looking towards losing more weight and her health plan. °  °Patient's had no signs or symptoms of incarceration or strangulation. °  °As she had no previous other abdominal operations. °  °Patient did have a CT scan recently Jerry reviewed personally.  Patient CT scan reveals a 10 cm wide upper hernia.  There appears to be a posterior rectus sheath separation inferiorly as well. °  °  °  °  °Review of Systems: °A complete review of systems was obtained from the patient.  I have reviewed this information and discussed as appropriate with the patient.  See HPI as well for other ROS. °  °Review of Systems  °Constitutional: Negative for fever.  °HENT: Negative for congestion.   °Eyes: Negative for blurred vision.  °Respiratory: Negative for cough, shortness of breath and wheezing.   °Cardiovascular: Negative for chest pain and palpitations.  °Gastrointestinal: Negative for heartburn.  °Genitourinary: Negative for dysuria.  °Musculoskeletal: Negative for myalgias.  °Skin: Negative for rash.  °Neurological: Negative for dizziness and headaches.  °Psychiatric/Behavioral:  Negative for depression and suicidal ideas.  °All other systems reviewed and are negative. °  °  °  °Medical History: °Past Medical History °Past Medical History: °Diagnosis        Date °           Diabetes mellitus without complication (CMS-HCC)    °           Glaucoma (increased eye pressure)    °           Hyperlipidemia              °           Hypertension     °           Sleep apnea      °  °  °  °There is no problem list on file for this patient. °  °  °Past Surgical History °Past Surgical History: °Procedure       Laterality         Date °           HERNIA REPAIR                       °  °  °  °Allergies °Allergies °Allergen           Reactions °           Aprepitant        Other (See Comments) °                            Urticaria °           Lisinopril          Cough °  °  °  °Current Outpatient Medications on File Prior to Visit °Medication       Sig       Dispense         Refill °           albuterol 90 mcg/actuation inhaler     Inhale into the lungs                   °           amLODIPine (NORVASC) 10 MG tablet        Take 1 tablet (10 mg total) by mouth once daily                      °           buPROPion (WELLBUTRIN SR) 150 MG SR tablet  Take by mouth                           °           COVID-19 vac, bv, Pfizer,,PF, (PFIZER COVID BIVAL,12Y UP,,PF,) 30 mcg/0.3 mL IM injection           Inject into the muscle                 °           cyanocobalamin (VITAMIN B12) 500 MCG tablet     Take by mouth                           °           ergocalciferol, vitamin D2, 1,250 mcg (50,000 unit) capsule 1 po q 10 days                           °           gabapentin (NEURONTIN) 600 MG tablet     Take 1 tablet (600 mg total) by mouth 3 (three) times daily                   °           hydrALAZINE (APRESOLINE) 25 MG tablet TAKE 1 AND 1/2 TABLETS THREE TIMES DAILY                °           lancets (ACCU-CHEK SOFTCLIX LANCETS)           Check BS three times a day                  °           lancing device with  lancets (ACCU-CHEK SOFT DEV LANCETS) kit          Check blood sugars 3 times a day.                  °           levocetirizine (XYZAL) 5 MG tablet    Take 1 tablet (5 mg total) by mouth every evening                         °             losartan (COZAAR) 50 MG tablet       Take 1 tablet (50 mg total) by mouth once daily                   °           meloxicam (MOBIC) 15 MG tablet     Take 1 tablet (15 mg total) by mouth once daily                   °           metFORMIN (GLUCOPHAGE) 1000 MG tablet        Take 1 tablet (1,000 mg total) by mouth 2 (two) times daily with meals                      °           metoprolol tartrate (LOPRESSOR) 25 MG tablet      Take 0.5 tablets (12.5 mg total) by mouth 2 (two) times daily                    °           montelukast (SINGULAIR) 10 mg tablet        Take by mouth                           °           rosuvastatin (CRESTOR) 20 MG tablet         Take 1 tablet (20 mg total) by mouth once daily                      °           semaglutide (OZEMPIC) 2 mg/dose (8 mg/3 mL) pen injector         Inject 2mg under the skin once a week                  °           ACCU-CHEK AVIVA PLUS TEST STRP test strip                                °           cephalexin (KEFLEX) 500 MG capsule          TAKE 1 CAPSULE BY MOUTH THREE TIMES DAILY FOR 10 DAYS                         °           dorzolamide-timoloL (COSOPT) 22.3-6.8 mg/mL ophthalmic solution                                     °           DROPLET PEN NEEDLE 29 gauge x 1/2" needle                                °           glimepiride (AMARYL) 4 MG tablet                                °

## 2021-04-18 NOTE — Progress Notes (Signed)
Chief Complaint:   OBESITY Sarah [name] is here to discuss her progress with her obesity treatment plan along with follow-up of her obesity related diagnoses. Sarah [name] is on keeping a food journal and adhering to recommended goals of 1250-1350 calories and 90 grams protein and states she is following her eating plan approximately 90% of the time. Sarah [name] states she is doing Silver Sneakers 45-50 minutes 3 times per week.  Today's visit was #: 28 Starting weight: 228 lbs Starting date: 02/15/2020 Today's weight: 212 lbs Today's date: 04/18/2021 Total lbs lost to date: 16 Total lbs lost since last in-office visit: 2  Interim History: Sarah [name] loves using the app and tracking. She doesn't eat enough protein. She forgot her paper log but has been doing it and learning a lot about her habits.  Subjective:   1. Type 2 diabetes mellitus with other specified complication, with long-term current use of insulin ([place]) Sarah [name]'s blood sugars run 90-99 and are really well controlled.  2. Seasonal allergies Medication is working well and pt denies side effects. She desires refills for Xyzal and Singulair.  3. Vitamin D deficiency She is currently taking prescription vitamin D 50,000 IU every 10 days. She denies nausea, vomiting or muscle weakness.  Assessment/Plan:  No orders of the defined types were placed in this encounter.   Medications Discontinued During This Encounter  Medication Reason   levocetirizine (XYZAL) 5 MG tablet Reorder   montelukast (SINGULAIR) 10 MG tablet Reorder   Vitamin D, Ergocalciferol, (DRISDOL) 1.25 MG (50000 UNIT) CAPS capsule Reorder   Semaglutide, 2 MG/DOSE, 8 MG/3ML SOPN Reorder     Meds ordered this encounter  Medications   Semaglutide, 2 MG/DOSE, 8 MG/3ML SOPN    Sig: Inject 2mg  under the skin once a week    Dispense:  3 mL    Refill:  0    Ov for RF; 30 d supply.   *If there is no more of the 2mg  doses due to supply issues - please provided pt with double  doses of the 1mg  pens.   Vitamin D, Ergocalciferol, (DRISDOL) 1.25 MG (50000 UNIT) CAPS capsule    Sig: 1 po q 10 days    Dispense:  3 capsule    Refill:  0    30 d only- pt needs OV for RF   levocetirizine (XYZAL) 5 MG tablet    Sig: Take 1 tablet (5 mg total) by mouth every evening.    Dispense:  30 tablet    Refill:  0   montelukast (SINGULAIR) 10 MG tablet    Sig: Take 1 tablet (10 mg total) by mouth at bedtime.    Dispense:  30 tablet    Refill:  0     1. Type 2 diabetes mellitus with other specified complication, with long-term current use of insulin (HCC) Good blood sugar control is important to decrease the likelihood of diabetic complications such as nephropathy, neuropathy, limb loss, blindness, coronary artery disease, and death. Intensive lifestyle modification including diet, exercise and weight loss are the first line of treatment for diabetes.   Refill- Semaglutide, 2 MG/DOSE, 8 MG/3ML SOPN; Inject 2mg  under the skin once a week  Dispense: 3 mL; Refill: 0  2. Seasonal allergies Continue current treatment plan.  Refill- levocetirizine (XYZAL) 5 MG tablet; Take 1 tablet (5 mg total) by mouth every evening.  Dispense: 30 tablet; Refill: 0 Refill- montelukast (SINGULAIR) 10 MG tablet; Take 1 tablet (10 mg total) by mouth at bedtime.  Dispense: 30 tablet; Refill: 0  3. Vitamin D deficiency Low Vitamin D level contributes to fatigue and are associated with obesity, breast, and colon cancer. She agrees to continue to take prescription Vitamin D 50,000 IU every 10 days and will follow-up for routine testing of Vitamin D, at least 2-3 times per year to avoid over-replacement.  Refill- Vitamin D, Ergocalciferol, (DRISDOL) 1.25 MG (50000 UNIT) CAPS capsule; 1 po q 10 days  Dispense: 3 capsule; Refill: 0  4. Obesity with current BMI of 35.3  Sarah [name] is currently in the action stage of change. As such, her goal is to continue with weight loss efforts. She has agreed to keeping a  food journal and adhering to recommended goals of 1250-1350 calories and 90 grams protein.   Bring log to next OV.  Exercise goals:  As is  Behavioral modification strategies: increasing lean protein intake, no skipping meals, and keeping a strict food journal.  Sarah [name] has agreed to follow-up with our clinic in 2 weeks. She was informed of the importance of frequent follow-up visits to maximize her success with intensive lifestyle modifications for her multiple health conditions.   Objective:   Blood pressure 117/76, pulse 72, temperature 98.1 F (36.7 C), height 5\' 5"  (1.651 m), weight 212 lb (96.2 kg), SpO2 100 %. Body mass index is 35.28 kg/m.  General: Cooperative, alert, well developed, in no acute distress. HEENT: Conjunctivae and lids unremarkable. Cardiovascular: Regular rhythm.  Lungs: Normal work of breathing. Neurologic: No focal deficits.   Lab Results  Component Value Date   CREATININE 1.28 (H) 03/07/2021   BUN 17 03/07/2021   NA 141 03/07/2021   K 4.7 03/07/2021   CL 104 03/07/2021   CO2 19 (L) 03/07/2021   Lab Results  Component Value Date   ALT 23 03/07/2021   AST 24 03/07/2021   ALKPHOS 68 03/07/2021   BILITOT 0.6 03/07/2021   Lab Results  Component Value Date   HGBA1C 7.0 (H) 03/07/2021   HGBA1C 7.2 (H) 11/29/2020   HGBA1C 5.6 06/14/2020   HGBA1C 6.9 (H) 04/17/2020   HGBA1C 9.0 (H) 01/11/2020   No results found for: INSULIN Lab Results  Component Value Date   TSH 0.938 02/15/2020   Lab Results  Component Value Date   CHOL 145 11/29/2020   HDL 44 11/29/2020   LDLCALC 60 11/29/2020   TRIG 261 (H) 11/29/2020   CHOLHDL 3.3 11/29/2020   Lab Results  Component Value Date   VD25OH 81.1 03/07/2021   VD25OH 60.6 11/29/2020   VD25OH 41.54 07/26/2020   Lab Results  Component Value Date   WBC 6.9 02/03/2020   HGB 13.3 02/03/2020   HCT 41.1 02/03/2020   MCV 92.2 02/03/2020   PLT 270 02/03/2020   Lab Results  Component Value Date   IRON  43 08/18/2010   TIBC 389 08/18/2010   FERRITIN 227 08/18/2010    Obesity Behavioral Intervention:   Approximately 15 minutes were spent on the discussion below.  ASK: We discussed the diagnosis of obesity with Sarah [name] today and Sarah [name] agreed to give [place] permission to discuss obesity behavioral modification therapy today.  ASSESS: Tanaka has the diagnosis of obesity and her BMI today is 35.3. Kazandra is in the action stage of change.   ADVISE: Rose was educated on the multiple health risks of obesity as well as the benefit of weight loss to improve her health. She was advised of the need for long term treatment and the importance of lifestyle modifications  to improve her current health and to decrease her risk of future health problems.  AGREE: Multiple dietary modification options and treatment options were discussed and Jaelyne agreed to follow the recommendations documented in the above note.  ARRANGE: Toiya was educated on the importance of frequent visits to treat obesity as outlined per CMS and USPSTF guidelines and agreed to schedule her next follow up appointment today.  Attestation Statements:   Reviewed by clinician on day of visit: allergies, medications, problem list, medical history, surgical history, family history, social history, and previous encounter notes.  Sarah [name], CMA, am acting as transcriptionist for [employer], DO.  I have reviewed the above documentation for accuracy and completeness, and I agree with the above. Marjory Sneddon, D.O.  The [place] [place] was signed into law in 2016 which includes the topic of electronic health records.  This provides immediate access to information in MyChart.  This includes consultation notes, operative notes, office notes, lab results and pathology reports.  If you have any questions about what you read please let us know at your next visit so we can discuss your concerns and take corrective action  if need be.  We are right here with you.

## 2021-05-07 ENCOUNTER — Encounter (INDEPENDENT_AMBULATORY_CARE_PROVIDER_SITE_OTHER): Payer: Self-pay | Admitting: Family Medicine

## 2021-05-07 ENCOUNTER — Other Ambulatory Visit: Payer: Self-pay

## 2021-05-07 ENCOUNTER — Ambulatory Visit (INDEPENDENT_AMBULATORY_CARE_PROVIDER_SITE_OTHER): Payer: Medicare HMO | Admitting: Family Medicine

## 2021-05-07 ENCOUNTER — Other Ambulatory Visit (HOSPITAL_COMMUNITY): Payer: Self-pay

## 2021-05-07 VITALS — BP 131/79 | HR 73 | Temp 97.8°F | Ht 65.0 in | Wt 214.0 lb

## 2021-05-07 DIAGNOSIS — Z6837 Body mass index (BMI) 37.0-37.9, adult: Secondary | ICD-10-CM

## 2021-05-07 DIAGNOSIS — E1159 Type 2 diabetes mellitus with other circulatory complications: Secondary | ICD-10-CM | POA: Diagnosis not present

## 2021-05-07 DIAGNOSIS — I152 Hypertension secondary to endocrine disorders: Secondary | ICD-10-CM | POA: Diagnosis not present

## 2021-05-07 DIAGNOSIS — F39 Unspecified mood [affective] disorder: Secondary | ICD-10-CM

## 2021-05-07 DIAGNOSIS — E1169 Type 2 diabetes mellitus with other specified complication: Secondary | ICD-10-CM | POA: Diagnosis not present

## 2021-05-07 DIAGNOSIS — Z794 Long term (current) use of insulin: Secondary | ICD-10-CM

## 2021-05-07 MED ORDER — BUPROPION HCL ER (SR) 150 MG PO TB12: 150.0000 mg | ORAL_TABLET | Freq: Every morning | ORAL | 0 refills | Status: DC

## 2021-05-07 MED ORDER — TIRZEPATIDE 10 MG/0.5ML ~~LOC~~ SOAJ
10.0000 mg | SUBCUTANEOUS | 0 refills | Status: DC
Start: 2021-05-07 — End: 2021-05-21
  Filled 2021-05-07: qty 2, 28d supply, fill #0

## 2021-05-08 ENCOUNTER — Other Ambulatory Visit (HOSPITAL_COMMUNITY): Payer: Self-pay

## 2021-05-08 NOTE — Progress Notes (Signed)
Chief Complaint:   OBESITY Sarah [name] is here to discuss her progress with her obesity treatment plan along with follow-up of her obesity related diagnoses. Sarah [name] is on keeping a food journal and adhering to recommended goals of 1250-1350 calories and 90 grams of protein daily and states she is following her eating plan approximately 90-95% of the time. Sarah [name] states she is doing Chief of Staff for 45-50 minutes 3 times per week.  Today's visit was #: 64 Starting weight: 228 lbs Starting date: 02/15/2020 Today's weight: 214 lbs Today's date: 05/07/2021 Total lbs lost to date: 14 Total lbs lost since last in-office visit: 0  Interim History: Sarah [name] did portion control and smarter choices over Thanksgiving. She wished she lost but not surprised with weight gain. She did not bring in her food log summary from journaling. She hasn't really been journaling lately.  Subjective:   1. Type 2 diabetes mellitus with other specified complication, with long-term current use of insulin (HCC) Sarah [name]'s fasting blood sugar range in the 90's and 2 hour post prandial range in the 130's. She is on metformin, Lantus, and Ozempic currently. She decreased her insulin by 5 units at her last office visit, and her blood sugars are stable. She is doing well and she feels she needs help with weight loss.  2. Hypertension associated with type 2 diabetes mellitus (Sarah [name]) Sarah [name]'s blood pressure is at goal, and she is taking Lopressor, Cozaar, Norvasc, and hydralazine. She is tolerating medication(s) well without side effects. Medication compliance is good and patient appears to be taking it as prescribed. Denies additional concerns regarding this condition.   BP Readings from Last 3 Encounters:  05/07/21 131/79  04/18/21 117/76  03/28/21 133/83   3. Mood disorder (Sarah [name]) with emotional eating Sarah [name] is struggling with emotional eating and using food for comfort to the extent that it is negatively impacting her  health. She has been working on behavior modification techniques to help reduce her emotional eating and has been somewhat successful. She shows no sign of suicidal or homicidal ideations.  Assessment/Plan:  No orders of the defined types were placed in this encounter.   Medications Discontinued During This Encounter  Medication Reason   Semaglutide, 2 MG/DOSE, 8 MG/3ML SOPN    buPROPion (WELLBUTRIN SR) 150 MG 12 hr tablet Reorder     Meds ordered this encounter  Medications   buPROPion (WELLBUTRIN SR) 150 MG 12 hr tablet    Sig: Take 1 tablet (150 mg total) by mouth in the morning.    Dispense:  30 tablet    Refill:  0   tirzepatide (MOUNJARO) 10 MG/0.5ML Pen    Sig: Inject 10 mg into the skin once a week.    Dispense:  2 mL    Refill:  0     1. Type 2 diabetes mellitus with other specified complication, with long-term current use of insulin (HCC) Risks and benefits of Mounjaro was discussed with the patient. She is on Ozempic 2 mg for >4 months now, and we will make changes to her regimen. Sarah [name] agreed to discontinue Ozempic and start Mounjaro 10 mg q weekly with no refills.  - tirzepatide (MOUNJARO) 10 MG/0.5ML Pen; Inject 10 mg into the skin once a week.  Dispense: 2 mL; Refill: 0  2. Hypertension associated with type 2 diabetes mellitus (Sarah [name]) Sarah [name] will continue her medications, and will continue working on healthy weight loss and exercise to improve blood pressure control. We will watch for signs of hypotension  as she continues her lifestyle modifications.  3. Mood disorder (Sarah [name]) with emotional eating Behavior modification techniques were discussed today to help Sarah [name] deal with her emotional/non-hunger eating behaviors. We will refill Wellbutrin SR for 1 month. Orders and follow up as documented in patient record.   - buPROPion (WELLBUTRIN SR) 150 MG 12 hr tablet; Take 1 tablet (150 mg total) by mouth in the morning.  Dispense: 30 tablet; Refill: 0  4. Obesity with  current BMI of 35.7 Sarah [name] is currently in the action stage of change. As such, her goal is to continue with weight loss efforts. She has agreed to keeping a food journal and adhering to recommended goals of 1250-1350 calories and 90 grams of protein daily.   Exercise goals: As is.  Behavioral modification strategies: increasing lean protein intake, decreasing simple carbohydrates, and keeping a strict food journal.  Sarah [name] has agreed to follow-up with our clinic in 2 to 3 weeks. She was informed of the importance of frequent follow-up visits to maximize her success with intensive lifestyle modifications for her multiple health conditions.   Objective:   Blood pressure 131/79, pulse 73, temperature 97.8 F (36.6 C), height 5\' 5"  (1.651 m), weight 214 lb (97.1 kg), SpO2 97 %. Body mass index is 35.61 kg/m.  General: Cooperative, alert, well developed, in no acute distress. HEENT: Conjunctivae and lids unremarkable. Cardiovascular: Regular rhythm.  Lungs: Normal work of breathing. Neurologic: No focal deficits.   Lab Results  Component Value Date   CREATININE 1.28 (H) 03/07/2021   BUN 17 03/07/2021   NA 141 03/07/2021   K 4.7 03/07/2021   CL 104 03/07/2021   CO2 19 (L) 03/07/2021   Lab Results  Component Value Date   ALT 23 03/07/2021   AST 24 03/07/2021   ALKPHOS 68 03/07/2021   BILITOT 0.6 03/07/2021   Lab Results  Component Value Date   HGBA1C 7.0 (H) 03/07/2021   HGBA1C 7.2 (H) 11/29/2020   HGBA1C 5.6 06/14/2020   HGBA1C 6.9 (H) 04/17/2020   HGBA1C 9.0 (H) 01/11/2020   No results found for: INSULIN Lab Results  Component Value Date   TSH 0.938 02/15/2020   Lab Results  Component Value Date   CHOL 145 11/29/2020   HDL 44 11/29/2020   LDLCALC 60 11/29/2020   TRIG 261 (H) 11/29/2020   CHOLHDL 3.3 11/29/2020   Lab Results  Component Value Date   VD25OH 81.1 03/07/2021   VD25OH 60.6 11/29/2020   VD25OH 41.54 07/26/2020   Lab Results  Component Value Date    WBC 6.9 02/03/2020   HGB 13.3 02/03/2020   HCT 41.1 02/03/2020   MCV 92.2 02/03/2020   PLT 270 02/03/2020   Lab Results  Component Value Date   IRON 43 08/18/2010   TIBC 389 08/18/2010   FERRITIN 227 08/18/2010    Obesity Behavioral Intervention:   Approximately 15 minutes were spent on the discussion below.  ASK: We discussed the diagnosis of obesity with Sarah [name] today and Sarah [name] agreed to give [place] permission to discuss obesity behavioral modification therapy today.  ASSESS: Sarah [name] has the diagnosis of obesity and her BMI today is 35.7. Sarah [name] is in the action stage of change.   ADVISE: Sarah [name] was educated on the multiple health risks of obesity as well as the benefit of weight loss to improve her health. She was advised of the need for long term treatment and the importance of lifestyle modifications to improve her current health and to decrease her risk of  future health problems.  AGREE: Multiple dietary modification options and treatment options were discussed and Sarah [name] agreed to follow the recommendations documented in the above note.  ARRANGE: Sarah [name] was educated on the importance of frequent visits to treat obesity as outlined per CMS and USPSTF guidelines and agreed to schedule her next follow up appointment today.  Attestation Statements:   Reviewed by clinician on day of visit: allergies, medications, problem list, medical history, surgical history, family history, social history, and previous encounter notes.   Sarah [name], am acting as transcriptionist for [employer], DO.  I have reviewed the above documentation for accuracy and completeness, and I agree with the above. Sarah [name], D.O.  The [place] was signed into law in 2016 which includes the topic of electronic health records.  This provides immediate access to information in MyChart.  This includes consultation notes, operative notes, office notes, lab results and  pathology reports.  If you have any questions about what you read please let us know at your next visit so we can discuss your concerns and take corrective action if need be.  We are right here with you.

## 2021-05-21 ENCOUNTER — Other Ambulatory Visit (HOSPITAL_COMMUNITY): Payer: Self-pay

## 2021-05-21 ENCOUNTER — Encounter (INDEPENDENT_AMBULATORY_CARE_PROVIDER_SITE_OTHER): Payer: Self-pay | Admitting: Family Medicine

## 2021-05-21 ENCOUNTER — Ambulatory Visit (INDEPENDENT_AMBULATORY_CARE_PROVIDER_SITE_OTHER): Payer: Medicare HMO | Admitting: Family Medicine

## 2021-05-21 ENCOUNTER — Other Ambulatory Visit: Payer: Self-pay

## 2021-05-21 VITALS — BP 121/78 | HR 74 | Temp 97.8°F | Ht 65.0 in | Wt 213.0 lb

## 2021-05-21 DIAGNOSIS — F39 Unspecified mood [affective] disorder: Secondary | ICD-10-CM

## 2021-05-21 DIAGNOSIS — J3089 Other allergic rhinitis: Secondary | ICD-10-CM | POA: Diagnosis not present

## 2021-05-21 DIAGNOSIS — Z794 Long term (current) use of insulin: Secondary | ICD-10-CM | POA: Diagnosis not present

## 2021-05-21 DIAGNOSIS — E1169 Type 2 diabetes mellitus with other specified complication: Secondary | ICD-10-CM | POA: Diagnosis not present

## 2021-05-21 DIAGNOSIS — Z6837 Body mass index (BMI) 37.0-37.9, adult: Secondary | ICD-10-CM | POA: Diagnosis not present

## 2021-05-21 MED ORDER — MONTELUKAST SODIUM 10 MG PO TABS: 10.0000 mg | ORAL_TABLET | Freq: Every day | ORAL | 0 refills | Status: DC

## 2021-05-21 MED ORDER — LEVOCETIRIZINE DIHYDROCHLORIDE 5 MG PO TABS
5.0000 mg | ORAL_TABLET | Freq: Every evening | ORAL | 0 refills | Status: DC
Start: 2021-05-21 — End: 2021-06-26

## 2021-05-21 MED ORDER — BUPROPION HCL ER (SR) 150 MG PO TB12: 150.0000 mg | ORAL_TABLET | Freq: Every morning | ORAL | 0 refills | Status: DC

## 2021-05-21 MED ORDER — TIRZEPATIDE 10 MG/0.5ML ~~LOC~~ SOAJ
10.0000 mg | SUBCUTANEOUS | 0 refills | Status: DC
  Filled 2021-05-21: qty 2, 28d supply, fill #0

## 2021-05-23 NOTE — Progress Notes (Signed)
Chief Complaint:   OBESITY Sarah [name] is here to discuss her progress with her obesity treatment plan along with follow-up of her obesity related diagnoses. Sarah [name] is on keeping a food journal and adhering to recommended goals of 1250-1350 calories and 90 grams of protein daily and states she is following her eating plan approximately 97% of the time. Sarah [name] states she is doing aerobics for 45-50 minutes 3 times per week.  Today's visit was #: 30 Starting weight: 228 lbs Starting date: 02/15/2020 Today's weight: 213 lbs Today's date: 05/21/2021 Total lbs lost to date: 15 Total lbs lost since last in-office visit: 1  Interim History: Sarah [name] is eating more fish and less read meat, but when she eats chips or french fries she feels like it's hard to stop eating it. Last week she overate with too many calories, but she didn't hit her protein goal.  Subjective:   1. Type 2 diabetes mellitus with other specified complication, with long-term current use of insulin (HCC) Sarah [name]'s fasting blood sugars range between 90-100, and 2 hour post prandial ranges between 140-150  She started her first shot of Mounjaro with no side effects yesterday.  2. Environmental and seasonal allergies Sarah [name] has seasonal allergies that have been more bothersome with the change of weather/season. She notes her medications are working well, and she has no congestion.    3. Mood disorder ([place]) with emotional eating Sarah [name] is on Wellbutrin SR. She notes she has been feeling down lately.  Assessment/Plan:  No orders of the defined types were placed in this encounter.   Medications Discontinued During This Encounter  Medication Reason   levocetirizine (XYZAL) 5 MG tablet Reorder   montelukast (SINGULAIR) 10 MG tablet Reorder   buPROPion (WELLBUTRIN SR) 150 MG 12 hr tablet Reorder   tirzepatide (MOUNJARO) 10 MG/0.5ML Pen Reorder     Meds ordered this encounter  Medications   buPROPion (WELLBUTRIN SR) 150 MG 12  hr tablet    Sig: Take 1 tablet (150 mg total) by mouth in the morning.    Dispense:  30 tablet    Refill:  0   levocetirizine (XYZAL) 5 MG tablet    Sig: Take 1 tablet (5 mg total) by mouth every evening.    Dispense:  30 tablet    Refill:  0   montelukast (SINGULAIR) 10 MG tablet    Sig: Take 1 tablet (10 mg total) by mouth at bedtime.    Dispense:  30 tablet    Refill:  0   tirzepatide (MOUNJARO) 10 MG/0.5ML Pen    Sig: Inject 10 mg into the skin once a week.    Dispense:  2 mL    Refill:  0     1. Type 2 diabetes mellitus with other specified complication, with long-term current use of insulin (HCC) We will refill Mounjaro 10 mg doe 1 month. Sarah [name] will decrease Lantus as tolerated. Good blood sugar control is important to decrease the likelihood of diabetic complications such as nephropathy, neuropathy, limb loss, blindness, coronary artery disease, and death. Intensive lifestyle modification including diet, exercise and weight loss are the first line of treatment for diabetes.   - tirzepatide (MOUNJARO) 10 MG/0.5ML Pen; Inject 10 mg into the skin once a week.  Dispense: 2 mL; Refill: 0  2. Environmental and seasonal allergies Sarah [name] will continue her medications, and we will refill Xyzal and Singulair for 1 month.  - levocetirizine (XYZAL) 5 MG tablet; Take 1 tablet (5 mg total) by  mouth every evening.  Dispense: 30 tablet; Refill: 0 - montelukast (SINGULAIR) 10 MG tablet; Take 1 tablet (10 mg total) by mouth at bedtime.  Dispense: 30 tablet; Refill: 0  3. Mood disorder (Sarah [name]) with emotional eating Behavior modification techniques were discussed today to help Sarah [name] deal with her emotional/non-hunger eating behaviors. We will refill Wellbutrin SR for 1 month. Orders and follow up as documented in patient record.   - buPROPion (WELLBUTRIN SR) 150 MG 12 hr tablet; Take 1 tablet (150 mg total) by mouth in the morning.  Dispense: 30 tablet; Refill: 0  4. Obesity BMI today is  Sarah [name] is currently in the action stage of change. As such, her goal is to continue with weight loss efforts. She has agreed to keeping a food journal and adhering to recommended goals of 1250-1350 calories and 90 grams of protein daily.   Sarah [name] is to bring in her log to her next office visit.  Exercise goals: Increase exercise to 5 days per week.  Behavioral modification strategies: increasing lean protein intake, decreasing simple carbohydrates, and planning for success.  Sarah [name] has agreed to follow-up with our clinic in 3 to 4 weeks. She was informed of the importance of frequent follow-up visits to maximize her success with intensive lifestyle modifications for her multiple health conditions.   Objective:   Blood pressure 121/78, pulse 74, temperature 97.8 F (36.6 C), height 5\' 5"  (1.651 m), weight 213 lb (96.6 kg), SpO2 98 %. Body mass index is 35.45 kg/m.  General: Cooperative, alert, well developed, in no acute distress. HEENT: Conjunctivae and lids unremarkable. Cardiovascular: Regular rhythm.  Lungs: Normal work of breathing. Neurologic: No focal deficits.   Lab Results  Component Value Date   CREATININE 1.28 (H) 03/07/2021   BUN 17 03/07/2021   NA 141 03/07/2021   K 4.7 03/07/2021   CL 104 03/07/2021   CO2 19 (L) 03/07/2021   Lab Results  Component Value Date   ALT 23 03/07/2021   AST 24 03/07/2021   ALKPHOS 68 03/07/2021   BILITOT 0.6 03/07/2021   Lab Results  Component Value Date   HGBA1C 7.0 (H) 03/07/2021   HGBA1C 7.2 (H) 11/29/2020   HGBA1C 5.6 06/14/2020   HGBA1C 6.9 (H) 04/17/2020   HGBA1C 9.0 (H) 01/11/2020   No results found for: INSULIN Lab Results  Component Value Date   TSH 0.938 02/15/2020   Lab Results  Component Value Date   CHOL 145 11/29/2020   HDL 44 11/29/2020   LDLCALC 60 11/29/2020   TRIG 261 (H) 11/29/2020   CHOLHDL 3.3 11/29/2020   Lab Results  Component Value Date   VD25OH 81.1 03/07/2021   VD25OH 60.6 11/29/2020    VD25OH 41.54 07/26/2020   Lab Results  Component Value Date   WBC 6.9 02/03/2020   HGB 13.3 02/03/2020   HCT 41.1 02/03/2020   MCV 92.2 02/03/2020   PLT 270 02/03/2020   Lab Results  Component Value Date   IRON 43 08/18/2010   TIBC 389 08/18/2010   FERRITIN 227 08/18/2010    Obesity Behavioral Intervention:   Approximately 15 minutes were spent on the discussion below.  ASK: We discussed the diagnosis of obesity with Sarah [name] today and Sarah [name] agreed to give [place] permission to discuss obesity behavioral modification therapy today.  ASSESS: Sarah [name] has the diagnosis of obesity and her BMI today is 35.5. Sarah [name] is in the action stage of change.   ADVISE: Sarah [name] was educated on the multiple health risks of  obesity as well as the benefit of weight loss to improve her health. She was advised of the need for long term treatment and the importance of lifestyle modifications to improve her current health and to decrease her risk of future health problems.  AGREE: Multiple dietary modification options and treatment options were discussed and Sarah [name] agreed to follow the recommendations documented in the above note.  ARRANGE: Sarah [name] was educated on the importance of frequent visits to treat obesity as outlined per CMS and USPSTF guidelines and agreed to schedule her next follow up appointment today.  Attestation Statements:   Reviewed by clinician on day of visit: allergies, medications, problem list, medical history, surgical history, family history, social history, and previous encounter notes.   Wilhemena Durie, am acting as transcriptionist for [employer], DO.  I have reviewed the above documentation for accuracy and completeness, and I agree with the above. Marjory Sneddon, D.O.  The [place] was signed into law in 2016 which includes the topic of electronic health records.  This provides immediate access to information in MyChart.  This includes  consultation notes, operative notes, office notes, lab results and pathology reports.  If you have any questions about what you read please let us know at your next visit so we can discuss your concerns and take corrective action if need be.  We are right here with you.

## 2021-05-26 ENCOUNTER — Other Ambulatory Visit: Payer: Self-pay | Admitting: Podiatry

## 2021-05-27 NOTE — Telephone Encounter (Signed)
Please advise 

## 2021-05-28 ENCOUNTER — Other Ambulatory Visit: Payer: Self-pay

## 2021-05-28 ENCOUNTER — Ambulatory Visit: Payer: Medicare HMO | Attending: Internal Medicine | Admitting: Internal Medicine

## 2021-05-28 ENCOUNTER — Encounter: Payer: Self-pay | Admitting: Internal Medicine

## 2021-05-28 DIAGNOSIS — E785 Hyperlipidemia, unspecified: Secondary | ICD-10-CM | POA: Diagnosis not present

## 2021-05-28 DIAGNOSIS — E1122 Type 2 diabetes mellitus with diabetic chronic kidney disease: Secondary | ICD-10-CM

## 2021-05-28 DIAGNOSIS — E1159 Type 2 diabetes mellitus with other circulatory complications: Secondary | ICD-10-CM

## 2021-05-28 DIAGNOSIS — I152 Hypertension secondary to endocrine disorders: Secondary | ICD-10-CM

## 2021-05-28 DIAGNOSIS — Z23 Encounter for immunization: Secondary | ICD-10-CM

## 2021-05-28 DIAGNOSIS — N183 Chronic kidney disease, stage 3 unspecified: Secondary | ICD-10-CM | POA: Diagnosis not present

## 2021-05-28 DIAGNOSIS — K436 Other and unspecified ventral hernia with obstruction, without gangrene: Secondary | ICD-10-CM | POA: Diagnosis not present

## 2021-05-28 DIAGNOSIS — E1169 Type 2 diabetes mellitus with other specified complication: Secondary | ICD-10-CM

## 2021-05-28 DIAGNOSIS — I700 Atherosclerosis of aorta: Secondary | ICD-10-CM

## 2021-05-28 LAB — GLUCOSE, POCT (MANUAL RESULT ENTRY): POC Glucose: 121 mg/dl — AB (ref 70–99)

## 2021-05-28 NOTE — Progress Notes (Signed)
Patient ID: MICAH GALENO, female    DOB: October 06, 1965  MRN: [id number]  CC: Diabetes and Hypertension   Subjective: Sarah [name] is a 55 y.o. 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 (S/p adjuvant chemotherapy completed on 12/08/14. S/p ex lap, hernia repair and small bowel resection for a presumed recurrence (benign pathology), Hypercalcemia with elevated free light chains/ ? MGUS, had BM bx (followed by [name]), dx with [place] by [name], 1.1 cm RLL nodule (bx negative 01/2020), RT thyroid nodule (does not met criteria for bx 10/2019),    HYPERTENSION Currently taking: see medication list.  She is currently on hydralazine, Cozaar, metoprolol and amlodipine. Med Adherence: _0  Yes and took already this a.m   _1  No Medication side effects: _2  Yes    _3  No Adherence with salt restriction: _4  Yes    _5  No Home Monitoring?: _6  Yes  but not often  _7  No Monitoring Frequency:  Home BP results range:  SOB? _8  Yes    _9  No Chest Pain?: _10  Yes    _11  No Leg swelling?: _12  Yes    _13  No Headaches?: _14  Yes    _15  No Dizziness? _16  Yes    _17  No Comments:   DIABETES TYPE 2/obesity Last A1C:   Results for orders placed or performed in visit on 05/28/21  POCT glucose (manual entry)  Result Value Ref Range   POC Glucose 121 (A) 70 - 99 mg/dl   Lab Results  Component Value Date   HGBA1C 7.0 (H) 03/07/2021   Med Adherence:  _18  Yes Ozempic changed to Mounjaro once a wk and Lantus dec to 25 units daily by Dr. Raliegh Scarlet with medical weight management.  Patient was continued on Metformin. Medication side effects:  _19  Yes    _20  No Home Monitoring?  _21  Yes 2x/day before BF and bedtime    _22  No Home glucose results range:90-100 Diet Adherence: _23  Yes    _24  No Exercise:  _25  Yes -3x/wk at [facility] - aerobics [place].  Weight is down 9 pounds since January of this year. Hypoglycemic episodes?: _26  Yes    _27  No Numbness of the feet? _28  Yes    _29  No Retinopathy hx? _30  Yes    _31  No Last eye exam:  up to date.  Reports having had eye exam 02/2021 Dr. Rolanda Jay at [facility] Comments:   HL/aortic atherosclerosis: Patient with aortic atherosclerosis noted on CT of the chest done 07/2020.  Compliant with Crestor.  Last LDL in June of this year was 8.  CKD 3: Most of last year GFR was in the upper 40s.  GFR earlier this year was 75 but subsequently decreased back to 49.  Last creatinine was 1.28.  HyperCa+: Calcium level remained in the upper 10s-mid 11s.  Worked up by both oncology [name] and endocrinology Dr. Vira Agar.  Based on 24-hour urine collection done by Dr. Vira Agar earlier this year, she seemed to have component of [place].  She had slight elevated free light chains when evaluated by [name].  Most recent calcium level done in September of this year by medical weight management was 12.3.  She reports drinking adequate fluids during the day.  Ventral hernia repair surgery planned for 07/03/2020.  No problems with anesthesia in past.    HM: Due for PCV 15 Patient Active Problem List   Diagnosis Date Noted   Mood disorder ([place]) with emotional eating 05/21/2021   Cutaneous abscess of back excluding buttocks 01/12/2021   Depressed mood, with emotional eating 10/23/2020   Seasonal allergies 09/05/2020   Class 2 severe obesity with serious comorbidity and body mass index (BMI) of 36.0 to 36.9 in adult ([place]) 07/24/2020   At risk for impaired metabolic function 91/47/8295   Hypertension associated with type 2 diabetes mellitus ([place]) 05/15/2020   Stage 3 chronic kidney disease ([place]) 05/01/2020   Type 2 diabetes mellitus with chronic kidney disease, with long-term current use of insulin ([place]) 04/17/2020   At risk for hypoglycemia 04/17/2020   B12  deficiency 02/29/2020   Diabetes mellitus ([place]) 02/15/2020   Vitamin D deficiency 02/15/2020   Other insomnia 01/24/2020   Morbid obesity ([place]) 01/24/2020   Elevated serum immunoglobulin free light chains 01/21/2020   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[place]) 01/05/2020   Thyroid nodule 10/14/2019   Renal insufficiency 06/24/2019   Hypercalcemia 06/24/2019   Hiatal hernia with GERD without esophagitis 06/18/2017   Need for influenza vaccination 02/17/2017   Ventral hernia 01/24/2016   Type 2 diabetes mellitus without complication, without long-term current use of insulin ([place]) 10/04/2015   Obesity (BMI 30-39.9) 10/04/2015   Hyperlipidemia associated with type 2 diabetes mellitus ([place]) 07/27/2015   Neuropathic pain of both legs 07/27/2015   Obesity (BMI 30.0-34.9) 12/24/2014   Genetic testing 11/01/2014   Constipation - functional 09/24/2014   Chemotherapy-induced peripheral neuropathy ([place]) 09/24/2014   Midline low back pain without sciatica 09/24/2014   Family history of colon cancer    Ovarian cancer, right ([place]) 07/08/2014   Hilar adenopathy    Hypertension associated with diabetes ([place])    Right lower lobe pulmonary nodule      Current Outpatient Medications on File Prior to Visit  Medication Sig Dispense Refill   Accu-Chek Softclix Lancets lancets Check BS three times a day 300 each 12   albuterol (VENTOLIN HFA) 108 (90 Base) MCG/ACT inhaler Inhale 2 puffs into the lungs every 6 (six) hours as needed for wheezing or shortness of breath. 8 g 0   amLODipine (NORVASC) 10 MG tablet Take 1 tablet (10 mg total) by mouth daily. 90 tablet 3   Blood Glucose Monitoring Suppl (ACCU-CHEK AVIVA PLUS) w/Device KIT USE AS DIRECTED 1 kit 0   buPROPion (WELLBUTRIN SR) 150 MG 12 hr tablet Take 1 tablet (150 mg total) by mouth in the morning. 30 tablet 0   COVID-19 mRNA bivalent vaccine, Pfizer, (PFIZER COVID-19 VAC BIVALENT) injection Inject into the muscle. 0.3 mL 0   DROPLET PEN NEEDLES 29G X 12MM MISC USE  AS DIRECTED 100 each 0   gabapentin (NEURONTIN) 600 MG tablet Take 1 tablet (600 mg total) by mouth 3 (three) times daily. 270 tablet 3   glucose blood (ACCU-CHEK AVIVA PLUS) test strip TEST BLOOD SUGAR AS DIRECTED 300 strip 12   hydrALAZINE (APRESOLINE) 25 MG tablet TAKE 1 AND 1/2 TABLETS THREE TIMES DAILY 405 tablet 3   Lancets Misc. (ACCU-CHEK SOFTCLIX LANCET DEV) KIT Check blood sugars 3 times a day. 3 kit 6   LANTUS SOLOSTAR 100 UNIT/ML Solostar Pen INJECT 30 UNITS DAILY. INCREASE BY 2 UNITS EVERY 3 DAYS UNTIL MORNING SUGAR IS LESS THAN 130. MAX OF 40 UNITS. 45 mL 1   levocetirizine (XYZAL) 5 MG tablet Take 1 tablet (5 mg total)  by mouth every evening. 30 tablet 0   losartan (COZAAR) 50 MG tablet TAKE 1 TABLET EVERY DAY 90 tablet 0   meloxicam (MOBIC) 15 MG tablet TAKE 1 TABLET EVERY DAY 90 tablet 1   metFORMIN (GLUCOPHAGE) 1000 MG tablet TAKE 1 TABLET TWICE DAILY WITH MEALS 180 tablet 1   metoprolol tartrate (LOPRESSOR) 25 MG tablet TAKE 1/2 TABLET TWICE DAILY 90 tablet 0   montelukast (SINGULAIR) 10 MG tablet Take 1 tablet (10 mg total) by mouth at bedtime. 30 tablet 0   rosuvastatin (CRESTOR) 20 MG tablet Take 1 tablet (20 mg total) by mouth daily. 90 tablet 0   timolol (BETIMOL) 0.5 % ophthalmic solution Place 1 drop into both eyes daily.     tirzepatide (MOUNJARO) 10 MG/0.5ML Pen Inject 10 mg into the skin once a week. 2 mL 0   Travoprost, BAK Free, (TRAVATAN) 0.004 % SOLN ophthalmic solution Place 1 drop into both eyes at bedtime.     vitamin B-12 (CYANOCOBALAMIN) 500 MCG tablet Take 1 tablet (500 mcg total) by mouth daily.     Vitamin D, Ergocalciferol, (DRISDOL) 1.25 MG (50000 UNIT) CAPS capsule 1 po q 10 days 3 capsule 0   No current facility-administered medications on file prior to visit.    Allergies  Allergen Reactions   Emend [Aprepitant] Other (See Comments)    Urticaria    Lisinopril Cough    Social History   Socioeconomic History   Marital status: Married     Spouse name: Not on file   Number of children: 2   Years of education: Not on file   Highest education level: High school graduate  Occupational History   Occupation: disability  Tobacco Use   Smoking status: Never   Smokeless tobacco: Never  Vaping Use   Vaping Use: Never used  Substance and Sexual Activity   Alcohol use: No   Drug use: No   Sexual activity: Not on file  Other Topics Concern   Not on file  Social History Narrative   No issues with transportation.    Attends church   Social Determinants of Health   Financial Resource Strain: Not on file  Food Insecurity: Not on file  Transportation Needs: Not on file  Physical Activity: Not on file  Stress: Not on file  Social Connections: Not on file  Intimate Partner Violence: Not on file    Family History  Problem Relation Age of Onset   Hypertension Mother    Hyperlipidemia Mother    Stroke Mother    Thyroid disease Mother    Hypertension Father    Diabetes Father    Hyperlipidemia Father    Sudden death Father    Cancer Sister 10       fibrosarcoma (back); currently 82   Prostate cancer Maternal Uncle    Colon cancer Paternal Aunt        Dx 38s; deceased 50s   Prostate cancer Paternal Uncle        currently 41   Cancer Paternal Uncle 81       "bone" ; unk. primary   Stomach cancer Paternal Uncle    Hypercalcemia Neg Hx     Past Surgical History:  Procedure Laterality Date   ABDOMINAL HYSTERECTOMY     FOOT SURGERY  04/2018   [facility]   INCISIONAL HERNIA REPAIR N/A 07/06/2015   Procedure: INCISIONAL HERNIA REPAIR ;  Surgeon: Rolm Bookbinder, MD;  Location: [facility];  Service: General;  Laterality: N/A;  INSERTION OF MESH N/A 07/06/2015   Procedure: WITH INSERTION OF PHASIX ST MESH;  Surgeon: Rolm Bookbinder, MD;  Location: [facility];  Service: General;  Laterality: N/A;   LAPAROTOMY N/A 07/06/2015   Procedure: EXPLORATORY LAPAROTOMY;  Surgeon: Everitt Amber, MD;  Location: [facility];  Service:  Gynecology;  Laterality: N/A;   LYSIS OF ADHESION N/A 07/06/2015   Procedure:  LYSIS OF ADHESION RESECTION OF MESENTERIC MASS BOWEL RESECTION ;  Surgeon: Everitt Amber, MD;  Location: [facility];  Service: Gynecology;  Laterality: N/A;   ROBOTIC ASSISTED TOTAL HYSTERECTOMY WITH BILATERAL SALPINGO OOPHERECTOMY Bilateral 07/28/2014   Procedure: ROBOTIC ASSISTED lysis of adhesions with biopsies, converted to LAPAROTOMY, bilateral salpingoorphorectomy, omentectomy,appendectomy;  Surgeon: Janie Morning, MD;  Location: [facility];  Service: Gynecology;  Laterality: Bilateral;   THYROIDECTOMY, PARTIAL     VIDEO BRONCHOSCOPY N/A 01/13/2020   Procedure: VIDEO BRONCHOSCOPY WITH BIOPSIES;  Surgeon: Melrose Nakayama, MD;  Location: [facility];  Service: Thoracic;  Laterality: N/A;    ROS: Review of Systems Negative except as stated above  PHYSICAL EXAM: BP 138/83    Pulse 67    Resp 16    Wt 215 lb 9.6 oz (97.8 kg)    LMP  (LMP Unknown)    SpO2 100%    BMI 35.88 kg/m   Wt Readings from Last 3 Encounters:  05/28/21 215 lb 9.6 oz (97.8 kg)  05/21/21 213 lb (96.6 kg)  05/07/21 214 lb (97.1 kg)    Physical Exam  General appearance - alert, well appearing, and in no distress Mental status - normal mood, behavior, speech, dress, motor activity, and thought processes Neck - supple, no significant adenopathy Chest - clear to auscultation, no wheezes, rales or rhonchi, symmetric air entry Heart - normal rate, regular rhythm, normal S1, S2, no murmurs, rubs, clicks or gallops Extremities - peripheral pulses normal, no pedal edema, no clubbing or cyanosis Diabetic Foot Exam - Simple   Simple Foot Form  05/28/2021  9:23 AM  Visual Inspection See comments: Yes Sensation Testing Intact to touch and monofilament testing bilaterally: Yes Pulse Check Posterior Tibialis and Dorsalis pulse intact bilaterally: Yes Comments She is mildly flat-footed.  Noninflamed bunion on the left big toe.  Tiny callus on the medial  aspect of the right big toe.  Toenails are thickened and discolored.      CMP Latest Ref Rng & Units 03/07/2021 11/29/2020 07/26/2020  Glucose 70 - 99 mg/dL 114(H) 110(H) -  BUN 6 - 24 mg/dL 17 22 -  Creatinine 0.57 - 1.00 mg/dL 1.28(H) 1.11(H) -  Sodium 134 - 144 mmol/L 141 140 -  Potassium 3.5 - 5.2 mmol/L 4.7 4.6 -  Chloride 96 - 106 mmol/L 104 103 -  CO2 20 - 29 mmol/L 19(L) 19(L) -  Calcium 8.7 - 10.2 mg/dL 12.3(H) 11.5(H) 11.7(H)  Total Protein 6.0 - 8.5 g/dL 7.4 7.0 -  Total Bilirubin 0.0 - 1.2 mg/dL 0.6 0.6 -  Alkaline Phos 44 - 121 IU/L 68 71 -  AST 0 - 40 IU/L 24 29 -  ALT 0 - 32 IU/L 23 33(H) -   Lipid Panel     Component Value Date/Time   CHOL 145 11/29/2020 1431   TRIG 261 (H) 11/29/2020 1431   HDL 44 11/29/2020 1431   CHOLHDL 3.3 11/29/2020 1431   CHOLHDL 5.1 (H) 08/14/2016 1729   VLDL 74 (H) 08/14/2016 1729   LDLCALC 60 11/29/2020 1431    CBC    Component Value Date/Time  WBC 6.9 02/03/2020 0837   WBC 6.8 01/11/2020 1017   RBC 4.46 02/03/2020 0837   HGB 13.3 02/03/2020 0837   HGB 13.6 12/04/2018 0914   HGB 12.9 04/22/2016 0750   HCT 41.1 02/03/2020 0837   HCT 41.0 12/04/2018 0914   HCT 39.0 04/22/2016 0750   PLT 270 02/03/2020 0837   PLT 332 12/04/2018 0914   MCV 92.2 02/03/2020 0837   MCV 88 12/04/2018 0914   MCV 92.2 04/22/2016 0750   MCH 29.8 02/03/2020 0837   MCHC 32.4 02/03/2020 0837   RDW 13.5 02/03/2020 0837   RDW 13.6 12/04/2018 0914   RDW 13.9 04/22/2016 0750   LYMPHSABS 2.6 02/03/2020 0837   LYMPHSABS 2.7 04/22/2016 0750   MONOABS 0.8 02/03/2020 0837   MONOABS 1.0 (H) 04/22/2016 0750   EOSABS 0.2 02/03/2020 0837   EOSABS 0.1 04/22/2016 0750   BASOSABS 0.0 02/03/2020 0837   BASOSABS 0.0 04/22/2016 0750    ASSESSMENT AND PLAN: 1. Type 2 diabetes mellitus with morbid obesity (HCC) GFR is greater than 35 and is associated with hypertension and hyperlipidemia. Blood sugar readings at goal.  She will continue current dose of current  medications listed above.  Encouraged her to continue healthy eating habits and regular exercise. - POCT glucose (manual entry) - Microalbumin / creatinine urine ratio  2. Hypertension associated with diabetes ([place]) Not at goal but no changes made to medications at this time.  I note that her last 3 blood pressure readings in the system have been at goal.  Encouraged her to check blood pressure at least once or twice a week with goal being 130/80 or lower.  3. Hyperlipidemia associated with type 2 diabetes mellitus ([place]) 4. Atherosclerosis of aorta (HCC) Continue Crestor.  LDL at goal.  5. CKD stage 3 due to type 2 diabetes mellitus ([place]) -Continue to monitor. Patient on meloxicam daily from her podiatrist.  Patient states she was put on it about a year ago to help decrease episodes of gout in her foot.  She is no longer on hydrochlorothiazide.  I recommend trying to take it as needed rather than every day.  6. Hypercalcemia This is being worked up fully by oncology and endocrinology.  Question of familial hypocalciuria hypercalcemia based on work-up done by endocrinology.  She had mild elevation in light chains based on work-up done by oncology.  Not sure whether the conclusion was MGUS or not.  Recheck BMP today.  If calcium level still above 12, I will get her back in with the endocrinologist and communicate with [name] - Basic Metabolic Panel  7. Ventral hernia with obstruction, without gangrene Patient scheduled for surgery the latter part of next month.  She is medically optimized.  8. Need for Streptococcus pneumoniae vaccination - PNEUMOCOCCAL CONJUGATE VACCINE 15-VALENT    Patient was given the opportunity to ask questions.  Patient verbalized understanding of the plan and was able to repeat key elements of the plan.   Orders Placed This Encounter  Procedures   PNEUMOCOCCAL CONJUGATE VACCINE [id number]   Basic Metabolic Panel   Microalbumin / creatinine urine ratio   POCT  glucose (manual entry)     Requested Prescriptions    No prescriptions requested or ordered in this encounter    Return in about 4 months (around 09/26/2021) for AWV in 2 weeks with Lurena Joiner.  Karle Plumber, MD, FACP

## 2021-05-28 NOTE — Patient Instructions (Signed)
Your blood pressure is elevated.  Gold is 130/80 or lower.  Please check your blood pressure 1-2 x a week.

## 2021-05-29 ENCOUNTER — Encounter (INDEPENDENT_AMBULATORY_CARE_PROVIDER_SITE_OTHER): Payer: Self-pay | Admitting: Family Medicine

## 2021-05-29 ENCOUNTER — Other Ambulatory Visit: Payer: Self-pay | Admitting: Internal Medicine

## 2021-05-29 LAB — BASIC METABOLIC PANEL
BUN/Creatinine Ratio: 15 (ref 9–23)
BUN: 19 mg/dL (ref 6–24)
CO2: 20 mmol/L (ref 20–29)
Calcium: 12.1 mg/dL — ABNORMAL HIGH (ref 8.7–10.2)
Chloride: 103 mmol/L (ref 96–106)
Creatinine, Ser: 1.23 mg/dL — ABNORMAL HIGH (ref 0.57–1.00)
Glucose: 103 mg/dL — ABNORMAL HIGH (ref 70–99)
Potassium: 4.4 mmol/L (ref 3.5–5.2)
Sodium: 139 mmol/L (ref 134–144)
eGFR: 52 mL/min/{1.73_m2} — ABNORMAL LOW (ref >59)

## 2021-05-29 LAB — MICROALBUMIN / CREATININE URINE RATIO
Creatinine, Urine: 110.5 mg/dL
Microalb/Creat Ratio: 31 mg/g creat — ABNORMAL HIGH (ref 0–29)
Microalbumin, Urine: 34.3 ug/mL

## 2021-05-31 ENCOUNTER — Other Ambulatory Visit (INDEPENDENT_AMBULATORY_CARE_PROVIDER_SITE_OTHER): Payer: Self-pay

## 2021-05-31 MED ORDER — MOUNJARO 7.5 MG/0.5ML ~~LOC~~ SOAJ: 7.5000 mg | SUBCUTANEOUS | 0 refills | Status: DC

## 2021-06-12 ENCOUNTER — Encounter (INDEPENDENT_AMBULATORY_CARE_PROVIDER_SITE_OTHER): Payer: Self-pay | Admitting: Physician Assistant

## 2021-06-12 ENCOUNTER — Ambulatory Visit (INDEPENDENT_AMBULATORY_CARE_PROVIDER_SITE_OTHER): Payer: Medicare HMO | Admitting: Physician Assistant

## 2021-06-12 ENCOUNTER — Other Ambulatory Visit (HOSPITAL_COMMUNITY): Payer: Self-pay

## 2021-06-12 ENCOUNTER — Other Ambulatory Visit: Payer: Self-pay

## 2021-06-12 VITALS — BP 122/84 | HR 70 | Temp 98.2°F | Ht 65.0 in | Wt 205.0 lb

## 2021-06-12 DIAGNOSIS — Z6837 Body mass index (BMI) 37.0-37.9, adult: Secondary | ICD-10-CM

## 2021-06-12 DIAGNOSIS — E1169 Type 2 diabetes mellitus with other specified complication: Secondary | ICD-10-CM | POA: Diagnosis not present

## 2021-06-12 DIAGNOSIS — E559 Vitamin D deficiency, unspecified: Secondary | ICD-10-CM | POA: Diagnosis not present

## 2021-06-12 DIAGNOSIS — Z794 Long term (current) use of insulin: Secondary | ICD-10-CM | POA: Diagnosis not present

## 2021-06-12 MED ORDER — ONDANSETRON HCL 4 MG PO TABS
ORAL_TABLET | ORAL | 0 refills | Status: DC
  Filled 2021-06-12: qty 20, 7d supply, fill #0

## 2021-06-12 MED ORDER — VITAMIN D (ERGOCALCIFEROL) 1.25 MG (50000 UNIT) PO CAPS: ORAL_CAPSULE | ORAL | 0 refills | Status: DC

## 2021-06-12 NOTE — Progress Notes (Addendum)
Chief Complaint:   OBESITY Sarah [name] is here to discuss her progress with her obesity treatment plan along with follow-up of her obesity related diagnoses. Sarah [name] is on the Category 2 Plan and states she is following her eating plan approximately 90-97% of the time. Sarah [name] states she is aerobics 50 minutes 3 times per week.  Today's visit was #: 72 Starting weight: 228 lbs Starting date: 02/15/2020 Today's weight: 205 lbs Today's date: 06/12/2021 Total lbs lost to date: 23 Total lbs lost since last in-office visit: 8  Interim History: Sarah [name] states that she is slightly bored with breakfast and is interested in another option. When she started [facility] 10mg , she had an episode of vomiting for 2 days and did not feel like eating much. She is afraid to restart 10mg  for this reason. 5mg  and 7.5mg  Mounjaro on back order and not available.   Subjective:   1. Type 2 diabetes mellitus with other specified complication, with long-term current use of insulin (HCC) Pt took Mounjaro 10 mg and had 2 vomiting episodes afterwards. Her BS averages in the 90's and she denies hypoglycemia.  2. Vitamin D deficiency She is on Rx Vit D and tolerating it well.  Assessment/Plan:   1. Type 2 diabetes mellitus with other specified complication, with long-term current use of insulin (HCC) Good blood sugar control is important to decrease the likelihood of diabetic complications such as nephropathy, neuropathy, limb loss, blindness, coronary artery disease, and death. Intensive lifestyle modification including diet, exercise and weight loss are the first line of treatment for diabetes. Mounjaro 7.5 mg is on backorder. Pt will take 10 mg Mounjaro with Zofran.  - ondansetron (ZOFRAN) 4 MG tablet; Take 1 tablet by mouth every 6 hours as needed  Dispense: 20 tablet; Refill: 0  2. Vitamin D deficiency Low Vitamin D level contributes to fatigue and are associated with obesity, breast, and colon cancer. She agrees to  continue to take prescription Vitamin D 50,000 IU every 10 days and will follow-up for routine testing of Vitamin D, at least 2-3 times per year to avoid over-replacement.  Refill- Vitamin D, Ergocalciferol, (DRISDOL) 1.25 MG (50000 UNIT) CAPS capsule; 1 po q 10 days  Dispense: 3 capsule; Refill: 0  3. Obesity BMI today is 34.11  Sarah [name] is currently in the action stage of change. As such, her goal is to continue with weight loss efforts. She has agreed to the Category 2 Plan.   Exercise goals:  As is  Behavioral modification strategies: increasing lean protein intake and no skipping meals.  Sarah [name] has agreed to follow-up with our clinic in 2 weeks. She was informed of the importance of frequent follow-up visits to maximize her success with intensive lifestyle modifications for her multiple health conditions.   Objective:   Blood pressure 122/84, pulse 70, temperature 98.2 F (36.8 C), height 5\' 5"  (1.651 m), weight 205 lb (93 kg), SpO2 98 %. Body mass index is 34.11 kg/m.  General: Cooperative, alert, well developed, in no acute distress. HEENT: Conjunctivae and lids unremarkable. Cardiovascular: Regular rhythm.  Lungs: Normal work of breathing. Neurologic: No focal deficits.   Lab Results  Component Value Date   CREATININE 1.23 (H) 05/28/2021   BUN 19 05/28/2021   NA 139 05/28/2021   K 4.4 05/28/2021   CL 103 05/28/2021   CO2 20 05/28/2021   Lab Results  Component Value Date   ALT 23 03/07/2021   AST 24 03/07/2021   ALKPHOS 68 03/07/2021  BILITOT 0.6 03/07/2021   Lab Results  Component Value Date   HGBA1C 7.0 (H) 03/07/2021   HGBA1C 7.2 (H) 11/29/2020   HGBA1C 5.6 06/14/2020   HGBA1C 6.9 (H) 04/17/2020   HGBA1C 9.0 (H) 01/11/2020   No results found for: INSULIN Lab Results  Component Value Date   TSH 0.938 02/15/2020   Lab Results  Component Value Date   CHOL 145 11/29/2020   HDL 44 11/29/2020   LDLCALC 60 11/29/2020   TRIG 261 (H) 11/29/2020   CHOLHDL  3.3 11/29/2020   Lab Results  Component Value Date   VD25OH 81.1 03/07/2021   VD25OH 60.6 11/29/2020   VD25OH 41.54 07/26/2020   Lab Results  Component Value Date   WBC 6.9 02/03/2020   HGB 13.3 02/03/2020   HCT 41.1 02/03/2020   MCV 92.2 02/03/2020   PLT 270 02/03/2020   Lab Results  Component Value Date   IRON 43 08/18/2010   TIBC 389 08/18/2010   FERRITIN 227 08/18/2010    Obesity Behavioral Intervention:   Approximately 15 minutes were spent on the discussion below.  ASK: We discussed the diagnosis of obesity with Sarah [name] today and Sarah [name] agreed to give [place] permission to discuss obesity behavioral modification therapy today.  ASSESS: Sarah [name] has the diagnosis of obesity and her BMI today is 34.2. Sarah [name] is in the action stage of change.   ADVISE: Sarah [name] was educated on the multiple health risks of obesity as well as the benefit of weight loss to improve her health. She was advised of the need for long term treatment and the importance of lifestyle modifications to improve her current health and to decrease her risk of future health problems.  AGREE: Multiple dietary modification options and treatment options were discussed and Sarah [name] agreed to follow the recommendations documented in the above note.  ARRANGE: Sarah [name] was educated on the importance of frequent visits to treat obesity as outlined per CMS and USPSTF guidelines and agreed to schedule her next follow up appointment today.  Attestation Statements:   Reviewed by clinician on day of visit: allergies, medications, problem list, medical history, surgical history, family history, social history, and previous encounter notes.  Coral Ceo, CMA, am acting as transcriptionist for [employer], PA-C.  I have reviewed the above documentation for accuracy and completeness, and I agree with the above. Abby Potash, PA-C

## 2021-06-14 ENCOUNTER — Encounter (INDEPENDENT_AMBULATORY_CARE_PROVIDER_SITE_OTHER): Payer: Self-pay | Admitting: Family Medicine

## 2021-06-14 DIAGNOSIS — Z794 Long term (current) use of insulin: Secondary | ICD-10-CM

## 2021-06-14 DIAGNOSIS — E1169 Type 2 diabetes mellitus with other specified complication: Secondary | ICD-10-CM

## 2021-06-18 MED ORDER — TIRZEPATIDE 10 MG/0.5ML ~~LOC~~ SOAJ: 10.0000 mg | SUBCUTANEOUS | 0 refills | Status: DC

## 2021-06-18 NOTE — Telephone Encounter (Signed)
Please advise 

## 2021-06-18 NOTE — Telephone Encounter (Signed)
Send in Cornelius 10mg  #4  0 RF please

## 2021-06-18 NOTE — Telephone Encounter (Signed)
Pt last seen by Tracey Aguilar, PA-C.  

## 2021-06-19 ENCOUNTER — Other Ambulatory Visit (HOSPITAL_COMMUNITY): Payer: Self-pay

## 2021-06-19 ENCOUNTER — Telehealth (INDEPENDENT_AMBULATORY_CARE_PROVIDER_SITE_OTHER): Payer: Self-pay

## 2021-06-19 DIAGNOSIS — Z794 Long term (current) use of insulin: Secondary | ICD-10-CM

## 2021-06-19 DIAGNOSIS — E1169 Type 2 diabetes mellitus with other specified complication: Secondary | ICD-10-CM

## 2021-06-19 MED ORDER — TIRZEPATIDE 10 MG/0.5ML ~~LOC~~ SOAJ
10.0000 mg | SUBCUTANEOUS | 0 refills | Status: DC
  Filled 2021-06-19: qty 2, 28d supply, fill #0

## 2021-06-19 NOTE — Telephone Encounter (Signed)
Needed to be sent to the [facility]

## 2021-06-20 ENCOUNTER — Other Ambulatory Visit: Payer: Self-pay | Admitting: Internal Medicine

## 2021-06-20 DIAGNOSIS — E785 Hyperlipidemia, unspecified: Secondary | ICD-10-CM

## 2021-06-20 NOTE — Telephone Encounter (Signed)
Requested Prescriptions  Pending Prescriptions Disp Refills   rosuvastatin (CRESTOR) 20 MG tablet [Pharmacy Med Name: ROSUVASTATIN CALCIUM 20 MG Tablet] 90 tablet 0    Sig: TAKE 1 TABLET EVERY DAY     Cardiovascular:  Antilipid - Statins Failed - 06/20/2021  7:50 AM      Failed - Triglycerides in normal range and within 360 days    Triglycerides  Date Value Ref Range Status  11/29/2020 261 (H) 0 - 149 mg/dL Final         Passed - Total Cholesterol in normal range and within 360 days    Cholesterol, Total  Date Value Ref Range Status  11/29/2020 145 100 - 199 mg/dL Final         Passed - LDL in normal range and within 360 days    LDL Chol Calc (NIH)  Date Value Ref Range Status  11/29/2020 60 0 - 99 mg/dL Final         Passed - HDL in normal range and within 360 days    HDL  Date Value Ref Range Status  11/29/2020 44 >39 mg/dL Final         Passed - Patient is not pregnant      Passed - Valid encounter within last 12 months    Recent Outpatient Visits          3 weeks ago Type 2 diabetes mellitus with morbid obesity ([place])   [place] Karle Plumber B, MD   4 months ago Type 2 diabetes mellitus with obesity ([place])   [place] Karle Plumber B, MD   5 months ago Cutaneous abscess of back excluding buttocks   [place], Kriste Basque, NP   9 months ago Type 2 diabetes mellitus with obesity [facility])   [place], Deborah B, MD   1 year ago Need for shingles vaccine   [place], Jarome Matin, RPH-CPP      Future Appointments            In 2 weeks Op[place], Neoma Laming, DO [place]   In 3 months Ladell Pier, MD [place]

## 2021-06-20 NOTE — Telephone Encounter (Signed)
Requested Prescriptions  Pending Prescriptions Disp Refills   rosuvastatin (CRESTOR) 20 MG tablet [Pharmacy Med Name: ROSUVASTATIN CALCIUM 20 MG Tablet] 90 tablet 0    Sig: TAKE 1 TABLET EVERY DAY     Cardiovascular:  Antilipid - Statins Failed - 06/20/2021  7:50 AM      Failed - Triglycerides in normal range and within 360 days    Triglycerides  Date Value Ref Range Status  11/29/2020 261 (H) 0 - 149 mg/dL Final         Passed - Total Cholesterol in normal range and within 360 days    Cholesterol, Total  Date Value Ref Range Status  11/29/2020 145 100 - 199 mg/dL Final         Passed - LDL in normal range and within 360 days    LDL Chol Calc (NIH)  Date Value Ref Range Status  11/29/2020 60 0 - 99 mg/dL Final         Passed - HDL in normal range and within 360 days    HDL  Date Value Ref Range Status  11/29/2020 44 >39 mg/dL Final         Passed - Patient is not pregnant      Passed - Valid encounter within last 12 months    Recent Outpatient Visits          3 weeks ago Type 2 diabetes mellitus with morbid obesity ([place])   [place] Karle Plumber B, MD   4 months ago Type 2 diabetes mellitus with obesity ([place])   [place] Karle Plumber B, MD   5 months ago Cutaneous abscess of back excluding buttocks   [place], Kriste Basque, NP   9 months ago Type 2 diabetes mellitus with obesity [facility])   [name], Deborah B, MD   1 year ago Need for shingles vaccine   [place], Jarome Matin, RPH-CPP      Future Appointments            In 2 weeks Opalski, Neoma Laming, DO [place]   In 3 months Ladell Pier, MD [place]

## 2021-06-21 ENCOUNTER — Telehealth: Payer: Self-pay | Admitting: Hematology

## 2021-06-21 NOTE — Telephone Encounter (Signed)
Pt was referred back to [name] by Dr. Durenda Age office. Spoke to pt who said she is having surgery and is not able to schedule until March. Scheduled f/u per referral, pt is aware of appt date and time.

## 2021-06-25 NOTE — Progress Notes (Signed)
Surgical Instructions    Your procedure is scheduled on Tuesday, January 24th.  Report to [facility] Main Entrance "A" at 5:30 A.M., then check in with the Admitting office.  Call this number if you have problems the morning of surgery:  [phone]   If you have any questions prior to your surgery date call [phone]: Open Monday-Friday 8am-4pm    Remember:  Do not eat after midnight the night before your surgery  You may drink clear liquids until 4:30 AM the morning of your surgery.   Clear liquids allowed are: Water, Non-Citrus Juices (without pulp), Carbonated Beverages, Clear Tea, Black Coffee ONLY (NO MILK, CREAM OR POWDERED CREAMER of any kind), and Gatorade    Take these medicines the morning of surgery with A SIP OF WATER   Amlodipine (Norvasc)  Bupropion (Wellbutrin)  Gabapentin (Neurontin)  Hydralazine (Apresoline)  Metoprolol (Lopressor)  Rosuvastatin (Crestor)   If needed:  Albuterol inhaler (bring with you on day of surgery)  Eye drops  Zofran  As of today, STOP taking any Aspirin (unless otherwise instructed by your surgeon) Aleve, Naproxen, Ibuprofen, Motrin, Advil, Goody's, BC's, all herbal medications, fish oil, and all vitamins.  WHAT DO I DO ABOUT MY DIABETES MEDICATION?   Do NOT take oral diabetes medicines (pills) the morning of surgery. - Metformin  THE NIGHT BEFORE SURGERY, take 10 units (50% of normal dose) of Lantus     THE MORNING OF SURGERY, take 10 units (50% of normal dose) of Lantus   HOW TO MANAGE YOUR DIABETES BEFORE AND AFTER SURGERY  Why is it important to control my blood sugar before and after surgery? Improving blood sugar levels before and after surgery helps healing and can limit problems. A way of improving blood sugar control is eating a healthy diet by:  Eating less sugar and carbohydrates  Increasing activity/exercise  Talking with your doctor about reaching your blood sugar goals High blood sugars (greater than 180  mg/dL) can raise your risk of infections and slow your recovery, so you will need to focus on controlling your diabetes during the weeks before surgery. Make sure that the doctor who takes care of your diabetes knows about your planned surgery including the date and location.  How do I manage my blood sugar before surgery? Check your blood sugar at least 4 times a day, starting 2 days before surgery, to make sure that the level is not too high or low.  Check your blood sugar the morning of your surgery when you wake up and every 2 hours until you get to the Short Stay unit.  If your blood sugar is less than 70 mg/dL, you will need to treat for low blood sugar: Do not take insulin. Treat a low blood sugar (less than 70 mg/dL) with  cup of clear juice (cranberry or apple), 4 glucose tablets, OR glucose gel. Recheck blood sugar in 15 minutes after treatment (to make sure it is greater than 70 mg/dL). If your blood sugar is not greater than 70 mg/dL on recheck, call [phone] for further instructions. Report your blood sugar to the short stay nurse when you get to Short Stay.  If you are admitted to the hospital after surgery: Your blood sugar will be checked by the staff and you will probably be given insulin after surgery (instead of oral diabetes medicines) to make sure you have good blood sugar levels. The goal for blood sugar control after surgery is 80-180 mg/dL.   DAY OF SURGERY:  Do not wear jewelry, makeup, or nail polish Do not wear lotions, powders, perfumes, or deodorant. Do not shave 48 hours prior to surgery.   Do not bring valuables to the hospital.             [facility] is not responsible for any belongings or valuables.  Do NOT Smoke (Tobacco/Vaping)  24 hours prior to your procedure  If you use a CPAP at night, you may bring your mask for your overnight stay.   Contacts, glasses, hearing aids, dentures or partials may not be worn into surgery, please bring  cases for these belongings   For patients admitted to the hospital, discharge time will be determined by your treatment team.   Patients discharged the day of surgery will not be allowed to drive home, and someone needs to stay with them for 24 hours.  NO VISITORS WILL BE ALLOWED IN PRE-OP WHERE PATIENTS ARE PREPPED FOR SURGERY.  ONLY 1 SUPPORT PERSON MAY BE PRESENT IN THE WAITING ROOM WHILE YOU ARE IN SURGERY.  IF YOU ARE TO BE ADMITTED, ONCE YOU ARE IN YOUR ROOM YOU WILL BE ALLOWED TWO (2) VISITORS. 1 (ONE) VISITOR MAY STAY OVERNIGHT BUT MUST ARRIVE TO THE ROOM BY 8pm.  Minor children may have two parents present. Special consideration for safety and communication needs will be reviewed on a case by case basis.  Special instructions:    Oral Hygiene is also important to reduce your risk of infection.  Remember - BRUSH YOUR TEETH THE MORNING OF SURGERY WITH YOUR REGULAR TOOTHPASTE   [place]- Preparing For Surgery  Before surgery, you can play an important role. Because skin is not sterile, your skin needs to be as free of germs as possible. You can reduce the number of germs on your skin by washing with CHG (chlorahexidine gluconate) Soap before surgery.  CHG is an antiseptic cleaner which kills germs and bonds with the skin to continue killing germs even after washing.     Please do not use if you have an allergy to CHG or antibacterial soaps. If your skin becomes reddened/irritated stop using the CHG.  Do not shave (including legs and underarms) for at least 48 hours prior to first CHG shower. It is OK to shave your face.  Please follow these instructions carefully.     Shower the NIGHT BEFORE SURGERY and the MORNING OF SURGERY with CHG Soap.   If you chose to wash your hair, wash your hair first as usual with your normal shampoo. After you shampoo, rinse your hair and body thoroughly to remove the shampoo.  Then [employer] and genitals (private parts) with your normal soap and rinse  thoroughly to remove soap.  After that Use CHG Soap as you would any other liquid soap. You can apply CHG directly to the skin and wash gently with a scrungie or a clean washcloth.   Apply the CHG Soap to your body ONLY FROM THE NECK DOWN.  Do not use on open wounds or open sores. Avoid contact with your eyes, ears, mouth and genitals (private parts). Wash Face and genitals (private parts)  with your normal soap.   Wash thoroughly, paying special attention to the area where your surgery will be performed.  Thoroughly rinse your body with warm water from the neck down.  DO NOT shower/wash with your normal soap after using and rinsing off the CHG Soap.  Pat yourself dry with a CLEAN TOWEL.  Wear CLEAN PAJAMAS to  bed the night before surgery  Place CLEAN SHEETS on your bed the night before your surgery  DO NOT SLEEP WITH PETS.   Day of Surgery:  Take a shower with CHG soap. Wear Clean/Comfortable clothing the morning of surgery Do not apply any deodorants/lotions.   Remember to brush your teeth WITH YOUR REGULAR TOOTHPASTE.   Please read over the following fact sheets that you were given.

## 2021-06-26 ENCOUNTER — Ambulatory Visit: Payer: Medicare HMO | Attending: Internal Medicine | Admitting: Pharmacist

## 2021-06-26 ENCOUNTER — Encounter: Payer: Self-pay | Admitting: Pharmacist

## 2021-06-26 ENCOUNTER — Ambulatory Visit (INDEPENDENT_AMBULATORY_CARE_PROVIDER_SITE_OTHER): Payer: Medicare HMO | Admitting: Family Medicine

## 2021-06-26 ENCOUNTER — Encounter (HOSPITAL_COMMUNITY)
Admission: RE | Admit: 2021-06-26 | Discharge: 2021-06-26 | Disposition: A | Discharge status: Home / Self Care | Payer: Medicare HMO | Source: Ambulatory Visit | Attending: General Surgery | Admitting: General Surgery

## 2021-06-26 ENCOUNTER — Encounter (INDEPENDENT_AMBULATORY_CARE_PROVIDER_SITE_OTHER): Payer: Self-pay | Admitting: Family Medicine

## 2021-06-26 ENCOUNTER — Other Ambulatory Visit: Payer: Self-pay

## 2021-06-26 ENCOUNTER — Encounter (HOSPITAL_COMMUNITY): Payer: Self-pay

## 2021-06-26 VITALS — BP 145/84 | HR 79 | Temp 97.7°F | Resp 18 | Ht 65.0 in | Wt 206.1 lb

## 2021-06-26 VITALS — BP 104/68 | HR 73 | Temp 97.7°F | Ht 65.0 in | Wt 203.0 lb

## 2021-06-26 VITALS — BP 111/71 | HR 82 | Temp 98.6°F | Ht 65.0 in | Wt 207.2 lb

## 2021-06-26 DIAGNOSIS — E538 Deficiency of other specified B group vitamins: Secondary | ICD-10-CM | POA: Diagnosis not present

## 2021-06-26 DIAGNOSIS — Z9049 Acquired absence of other specified parts of digestive tract: Secondary | ICD-10-CM | POA: Diagnosis not present

## 2021-06-26 DIAGNOSIS — E119 Type 2 diabetes mellitus without complications: Secondary | ICD-10-CM | POA: Diagnosis not present

## 2021-06-26 DIAGNOSIS — Z01818 Encounter for other preprocedural examination: Secondary | ICD-10-CM | POA: Insufficient documentation

## 2021-06-26 DIAGNOSIS — J3089 Other allergic rhinitis: Secondary | ICD-10-CM

## 2021-06-26 DIAGNOSIS — H409 Unspecified glaucoma: Secondary | ICD-10-CM | POA: Insufficient documentation

## 2021-06-26 DIAGNOSIS — E559 Vitamin D deficiency, unspecified: Secondary | ICD-10-CM | POA: Diagnosis not present

## 2021-06-26 DIAGNOSIS — R911 Solitary pulmonary nodule: Secondary | ICD-10-CM | POA: Insufficient documentation

## 2021-06-26 DIAGNOSIS — Z90710 Acquired absence of both cervix and uterus: Secondary | ICD-10-CM | POA: Insufficient documentation

## 2021-06-26 DIAGNOSIS — G4733 Obstructive sleep apnea (adult) (pediatric): Secondary | ICD-10-CM | POA: Diagnosis not present

## 2021-06-26 DIAGNOSIS — G629 Polyneuropathy, unspecified: Secondary | ICD-10-CM | POA: Insufficient documentation

## 2021-06-26 DIAGNOSIS — E669 Obesity, unspecified: Secondary | ICD-10-CM | POA: Diagnosis not present

## 2021-06-26 DIAGNOSIS — I10 Essential (primary) hypertension: Secondary | ICD-10-CM | POA: Diagnosis not present

## 2021-06-26 DIAGNOSIS — E890 Postprocedural hypothyroidism: Secondary | ICD-10-CM | POA: Diagnosis not present

## 2021-06-26 DIAGNOSIS — Z6834 Body mass index (BMI) 34.0-34.9, adult: Secondary | ICD-10-CM | POA: Diagnosis not present

## 2021-06-26 DIAGNOSIS — E1169 Type 2 diabetes mellitus with other specified complication: Secondary | ICD-10-CM

## 2021-06-26 DIAGNOSIS — Z794 Long term (current) use of insulin: Secondary | ICD-10-CM | POA: Diagnosis not present

## 2021-06-26 DIAGNOSIS — Z6833 Body mass index (BMI) 33.0-33.9, adult: Secondary | ICD-10-CM | POA: Diagnosis not present

## 2021-06-26 DIAGNOSIS — Z9221 Personal history of antineoplastic chemotherapy: Secondary | ICD-10-CM | POA: Insufficient documentation

## 2021-06-26 DIAGNOSIS — K219 Gastro-esophageal reflux disease without esophagitis: Secondary | ICD-10-CM | POA: Insufficient documentation

## 2021-06-26 DIAGNOSIS — Z8543 Personal history of malignant neoplasm of ovary: Secondary | ICD-10-CM | POA: Diagnosis not present

## 2021-06-26 DIAGNOSIS — K449 Diaphragmatic hernia without obstruction or gangrene: Secondary | ICD-10-CM | POA: Diagnosis not present

## 2021-06-26 DIAGNOSIS — Z0000 Encounter for general adult medical examination without abnormal findings: Secondary | ICD-10-CM | POA: Diagnosis not present

## 2021-06-26 HISTORY — DX: Hypercalcemia: E83.52

## 2021-06-26 LAB — CBC
HCT: 41.2 % (ref 36.0–46.0)
Hemoglobin: 13.2 g/dL (ref 12.0–15.0)
MCH: 29.7 pg (ref 26.0–34.0)
MCHC: 32 g/dL (ref 30.0–36.0)
MCV: 92.8 fL (ref 80.0–100.0)
Platelets: 309 10*3/uL (ref 150–400)
RBC: 4.44 MIL/uL (ref 3.87–5.11)
RDW: 12.6 % (ref 11.5–15.5)
WBC: 5.2 10*3/uL (ref 4.0–10.5)
nRBC: 0 % (ref 0.0–0.2)

## 2021-06-26 LAB — BASIC METABOLIC PANEL
Anion gap: 13 (ref 5–15)
BUN: 18 mg/dL (ref 6–20)
CO2: 24 mmol/L (ref 22–32)
Calcium: 11.3 mg/dL — ABNORMAL HIGH (ref 8.9–10.3)
Chloride: 102 mmol/L (ref 98–111)
Creatinine, Ser: 1.17 mg/dL — ABNORMAL HIGH (ref 0.44–1.00)
GFR, Estimated: 55 mL/min — ABNORMAL LOW (ref >60)
Glucose, Bld: 127 mg/dL — ABNORMAL HIGH (ref 70–99)
Potassium: 3.7 mmol/L (ref 3.5–5.1)
Sodium: 139 mmol/L (ref 135–145)

## 2021-06-26 LAB — GLUCOSE, CAPILLARY: Glucose-Capillary: 124 mg/dL — ABNORMAL HIGH (ref 70–99)

## 2021-06-26 MED ORDER — LEVOCETIRIZINE DIHYDROCHLORIDE 5 MG PO TABS: 5.0000 mg | ORAL_TABLET | Freq: Every evening | ORAL | 0 refills | Status: DC

## 2021-06-26 MED ORDER — MONTELUKAST SODIUM 10 MG PO TABS: 10.0000 mg | ORAL_TABLET | Freq: Every day | ORAL | 0 refills | Status: DC

## 2021-06-26 NOTE — Progress Notes (Signed)
PCP - Karle Plumber Cardiologist - denies  Chest x-ray - n/a EKG - 06/26/21  SA - yes, does not wear CPAP  DM - Type 2 Fasting Blood Sugar - 88-90   ERAS Protcol - yes, no drink ordered or given   COVID TEST- n/a (ambulatory)   Anesthesia review: n/a  Patient denies shortness of breath, fever, cough and chest pain at PAT appointment   All instructions explained to the patient, with a verbal understanding of the material. Patient agrees to go over the instructions while at home for a better understanding. Patient also instructed to self quarantine after being tested for COVID-19. The opportunity to ask questions was provided.

## 2021-06-26 NOTE — Progress Notes (Signed)
Chief Complaint:   OBESITY Sarah [name] is here to discuss her progress with her obesity treatment plan along with follow-up of her obesity related diagnoses. Sarah [name] is on the Category 2 Plan and states she is following her eating plan approximately 95% of the time. Sarah [name] states she is doing aerobics for 45-50 minutes 3 times per week.  Today's visit was #: 35 Starting weight: 228 lbs Starting date: 02/15/2020 Today's weight: 203 lbs Today's date: 06/26/2021 Total lbs lost to date: 25 Total lbs lost since last in-office visit: 2  Interim History: Sarah [name] is having abdominal hernia repair surgery 1 week from today. Sarah [name] is here for a follow up office visit. We reviewed her meal plan and questions were answered. Patient's food recall appears to be accurate and consistent with what is on plan when she is following it.   When eating on plan, her hunger and cravings are well controlled.    Subjective:   1. Type 2 diabetes mellitus with other specified complication, with long-term current use of insulin (HCC) Sarah [name]'s fasting blood sugars range between 80's-100's. Since starting [facility] they have been between 80-90's. She is at 10 mg of Mounjaro with some nausea, which she was given Zofran for. Her symptoms are improving a lot, and now only 1 day after the injection, she feels sick.    2. Vitamin D deficiency Sarah [name] is currently taking prescription vitamin D 50,000 IU every 10 days. She denies nausea, vomiting or muscle weakness.  3. B12 deficiency Sarah [name] takes B12 500 mcg daily. Her energy level has improved.   4. Environmental and seasonal allergies Sarah [name] is tolerating medication(s) well without side effects.  Medication compliance is good and patient appears to be taking it as prescribed.  Denies additional concerns regarding this condition.   Assessment/Plan:   Orders Placed This Encounter  Procedures   VITAMIN D 25 Hydroxy (Vit-D Deficiency, Fractures)    Hemoglobin A1c   Vitamin B12    Medications Discontinued During This Encounter  Medication Reason   levocetirizine (XYZAL) 5 MG tablet Reorder   montelukast (SINGULAIR) 10 MG tablet Reorder     Meds ordered this encounter  Medications   levocetirizine (XYZAL) 5 MG tablet    Sig: Take 1 tablet (5 mg total) by mouth every evening.    Dispense:  30 tablet    Refill:  0   montelukast (SINGULAIR) 10 MG tablet    Sig: Take 1 tablet (10 mg total) by mouth at bedtime.    Dispense:  30 tablet    Refill:  0     1. Type 2 diabetes mellitus with other specified complication, with long-term current use of insulin (HCC) Sarah [name] will continue Mounjaro, but hold off on injection post surgery. She will be extra vigilant after surgery. She will continue at 20 units of insulin and will titrate down as her sugar indicates. Good blood sugar control is important to decrease the likelihood of diabetic complications such as nephropathy, neuropathy, limb loss, blindness, coronary artery disease, and death. Intensive lifestyle modification including diet, exercise and weight loss are the first line of treatment for diabetes.   - Hemoglobin A1c  2. Vitamin D deficiency We will check labs today. Sarah [name] will continue prescription Vitamin D 50,000 IU every 10 days, may need adjustment to dose if her level has decreased over the Winter season. She will follow-up for routine testing of Vitamin D, at least 2-3 times per year to avoid over-replacement.  -  VITAMIN D 25 Hydroxy (Vit-D Deficiency, Fractures)  3. B12 deficiency The diagnosis was reviewed with the patient. Counseling provided today, see below. We will check labs today. Sarah [name] will continue her B12 supplementation. Orders and follow up as documented in patient record.  Counseling The body needs vitamin B12: to make red blood cells; to make DNA; and to help the nerves work properly so they can carry messages from the brain to the body.  The main causes  of vitamin B12 deficiency include dietary deficiency, digestive diseases, pernicious anemia, and having a surgery in which part of the stomach or small intestine is removed.  Certain medicines can make it harder for the body to absorb vitamin B12. These medicines include: heartburn medications; some antibiotics; some medications used to treat diabetes, gout, and high cholesterol.  In some cases, there are no symptoms of this condition. If the condition leads to anemia or nerve damage, various symptoms can occur, such as weakness or fatigue, shortness of breath, and numbness or tingling in your hands and feet.   Treatment:  May include taking vitamin B12 supplements.  Avoid alcohol.  Eat lots of healthy foods that contain vitamin B12: Beef, pork, chicken, [place], and organ meats, such as liver.  Seafood: This includes clams, rainbow trout, salmon, tuna, and haddock. Eggs.  Cereal and dairy products that are fortified: This means that vitamin B12 has been added to the food.   - Vitamin B12  4. Environmental and seasonal allergies Sarah [name] will continue her medications, and we will refill Xyzal and Singulair for 1 month.  - levocetirizine (XYZAL) 5 MG tablet; Take 1 tablet (5 mg total) by mouth every evening.  Dispense: 30 tablet; Refill: 0 - montelukast (SINGULAIR) 10 MG tablet; Take 1 tablet (10 mg total) by mouth at bedtime.  Dispense: 30 tablet; Refill: 0  5. Obesity with current BMI of 33.8 Sarah [name] is currently in the action stage of change. As such, her goal is to continue with weight loss efforts. She has agreed to the Category 2 Plan.   I had a long discussion with the patient today in regards to meal prep for after surgery, and preparing for post surgical period.   Exercise goals: As is.  Behavioral modification strategies: increasing water intake, meal planning and cooking strategies, and planning for success.  Sarah [name] has agreed to follow-up with our clinic in 2 to 3 weeks. She was  informed of the importance of frequent follow-up visits to maximize her success with intensive lifestyle modifications for her multiple health conditions.   Sarah [name] was informed we would discuss her lab results at her next visit unless there is a critical issue that needs to be addressed sooner. Sarah [name] agreed to keep her next visit at the agreed upon time to discuss these results.  Objective:   Blood pressure 104/68, pulse 73, temperature 97.7 F (36.5 C), height 5\' 5"  (1.651 m), weight 203 lb (92.1 kg), SpO2 98 %. Body mass index is 33.78 kg/m.  General: Cooperative, alert, well developed, in no acute distress. HEENT: Conjunctivae and lids unremarkable. Cardiovascular: Regular rhythm.  Lungs: Normal work of breathing. Neurologic: No focal deficits.   Lab Results  Component Value Date   CREATININE 1.23 (H) 05/28/2021   BUN 19 05/28/2021   NA 139 05/28/2021   K 4.4 05/28/2021   CL 103 05/28/2021   CO2 20 05/28/2021   Lab Results  Component Value Date   ALT 23 03/07/2021   AST 24 03/07/2021   ALKPHOS 68  03/07/2021   BILITOT 0.6 03/07/2021   Lab Results  Component Value Date   HGBA1C 7.0 (H) 03/07/2021   HGBA1C 7.2 (H) 11/29/2020   HGBA1C 5.6 06/14/2020   HGBA1C 6.9 (H) 04/17/2020   HGBA1C 9.0 (H) 01/11/2020   No results found for: INSULIN Lab Results  Component Value Date   TSH 0.938 02/15/2020   Lab Results  Component Value Date   CHOL 145 11/29/2020   HDL 44 11/29/2020   LDLCALC 60 11/29/2020   TRIG 261 (H) 11/29/2020   CHOLHDL 3.3 11/29/2020   Lab Results  Component Value Date   VD25OH 81.1 03/07/2021   VD25OH 60.6 11/29/2020   VD25OH 41.54 07/26/2020   Lab Results  Component Value Date   WBC 6.9 02/03/2020   HGB 13.3 02/03/2020   HCT 41.1 02/03/2020   MCV 92.2 02/03/2020   PLT 270 02/03/2020   Lab Results  Component Value Date   IRON 43 08/18/2010   TIBC 389 08/18/2010   FERRITIN 227 08/18/2010    Obesity Behavioral Intervention:    Approximately 15 minutes were spent on the discussion below.  ASK: We discussed the diagnosis of obesity with Sarah [name] today and Sarah [name] agreed to give [place] permission to discuss obesity behavioral modification therapy today.  ASSESS: Yamilee has the diagnosis of obesity and her BMI today is 33.8. Sarah [name] is in the action stage of change.   ADVISE: Sarah [name] was educated on the multiple health risks of obesity as well as the benefit of weight loss to improve her health. She was advised of the need for long term treatment and the importance of lifestyle modifications to improve her current health and to decrease her risk of future health problems.  AGREE: Multiple dietary modification options and treatment options were discussed and Sarah [name] agreed to follow the recommendations documented in the above note.  ARRANGE: Sarah [name] was educated on the importance of frequent visits to treat obesity as outlined per CMS and USPSTF guidelines and agreed to schedule her next follow up appointment today.  Attestation Statements:   Reviewed by clinician on day of visit: allergies, medications, problem list, medical history, surgical history, family history, social history, and previous encounter notes.   Sarah [name], am acting as Location manager for [employer], DO.  I have reviewed the above documentation for accuracy and completeness, and I agree with the above. Sarah Sneddon, D.O.  The [place] was signed into law in 2016 which includes the topic of electronic health records.  This provides immediate access to information in MyChart.  This includes consultation notes, operative notes, office notes, lab results and pathology reports.  If you have any questions about what you read please let us know at your next visit so we can discuss your concerns and take corrective action if need be.  We are right here with you.

## 2021-06-26 NOTE — Progress Notes (Signed)
Subjective:   Sarah [name] is a 56 y.o. female who presents for Medicare Annual (Subsequent) preventive examination.     Objective:    Today's Vitals   06/26/21 1531 06/26/21 1532  BP: 111/71   Pulse: 82   Temp: 98.6 F (37 C)   SpO2: 100%   Weight: 207 lb 3.2 oz (94 kg)   Height: _0  (1.651 m)   PainSc: 0-No pain 0-No pain   Body mass index is 34.48 kg/m.  Advanced Directives 06/26/2021 06/26/2021 02/03/2020 01/28/2020 01/11/2020 01/05/2020 01/05/2020  Does Patient Have a Medical Advance Directive? _1  No No  Does patient want to make changes to medical advance directive? - - - - - - -  Copy of [place] in Chart? - - - - - - -  Would patient like information on creating a medical advance directive? No - Patient declined No - Patient declined No - Patient declined No - Patient declined No - Patient declined - -    Current Medications (verified) Outpatient Encounter Medications as of 06/26/2021  Medication Sig   Accu-Chek Softclix Lancets lancets Check BS three times a day   albuterol (VENTOLIN HFA) 108 (90 Base) MCG/ACT inhaler Inhale 2 puffs into the lungs every 6 (six) hours as needed for wheezing or shortness of breath.   amLODipine (NORVASC) 10 MG tablet Take 1 tablet (10 mg total) by mouth daily.   aspirin-sod bicarb-citric acid (ALKA-SELTZER) 325 MG TBEF tablet Take 650 mg by mouth every 6 (six) hours as needed (indigestion).   Blood Glucose Monitoring Suppl (ACCU-CHEK AVIVA PLUS) w/Device KIT USE AS DIRECTED   buPROPion (WELLBUTRIN SR) 150 MG 12 hr tablet Take 1 tablet (150 mg total) by mouth in the morning.   dorzolamide-timolol (COSOPT) 22.3-6.8 MG/ML ophthalmic solution Place 1 drop into both eyes 2 (two) times daily.   DROPLET PEN NEEDLES 29G X 12MM MISC USE AS DIRECTED   gabapentin (NEURONTIN) 600 MG tablet Take 1 tablet (600 mg total) by mouth 3 (three) times daily.   glucose blood (ACCU-CHEK AVIVA PLUS) test strip TEST BLOOD SUGAR AS  DIRECTED   hydrALAZINE (APRESOLINE) 25 MG tablet TAKE 1 AND 1/2 TABLETS THREE TIMES DAILY   Lancets Misc. (ACCU-CHEK SOFTCLIX LANCET DEV) KIT Check blood sugars 3 times a day.   LANTUS SOLOSTAR 100 UNIT/ML Solostar Pen INJECT 30 UNITS DAILY. INCREASE BY 2 UNITS EVERY 3 DAYS UNTIL MORNING SUGAR IS LESS THAN 130. MAX OF 40 UNITS. (Patient taking differently: Inject 20 Units into the skin at bedtime.)   latanoprost (XALATAN) 0.005 % ophthalmic solution Place 1 drop into both eyes at bedtime.   levocetirizine (XYZAL) 5 MG tablet Take 1 tablet (5 mg total) by mouth every evening.   losartan (COZAAR) 50 MG tablet TAKE 1 TABLET EVERY DAY   metFORMIN (GLUCOPHAGE) 1000 MG tablet TAKE 1 TABLET TWICE DAILY WITH MEALS   metoprolol tartrate (LOPRESSOR) 25 MG tablet TAKE 1/2 TABLET TWICE DAILY   montelukast (SINGULAIR) 10 MG tablet Take 1 tablet (10 mg total) by mouth at bedtime.   ondansetron (ZOFRAN) 4 MG tablet Take 1 tablet by mouth every 6 hours as needed   rosuvastatin (CRESTOR) 20 MG tablet TAKE 1 TABLET EVERY DAY   tirzepatide (MOUNJARO) 10 MG/0.5ML Pen Inject 10 mg (1 pen) into the skin once a week.   vitamin B-12 (CYANOCOBALAMIN) 500 MCG tablet Take 1 tablet (500 mcg total) by mouth daily.   Vitamin D, Ergocalciferol, (DRISDOL)  1.25 MG (50000 UNIT) CAPS capsule 1 po q 10 days   No facility-administered encounter medications on file as of 06/26/2021.    Allergies (verified) Emend [aprepitant] and Lisinopril   History: Past Medical History:  Diagnosis Date   Arthritis    Diabetes mellitus without complication ([place])    Family history of adverse reaction to anesthesia    sister-n/v   Family history of colon cancer    GERD (gastroesophageal reflux disease)    Glaucoma 02/16   Glucagonoma 07/28/14   Pt denies this but states she has glaucoma   History of chemotherapy    History of hiatal hernia    Hypertension    Imbalance    Neuropathy    feet bilat   Nocturia    Peritoneal  carcinomatosis ([place])    carcinoma of ovary    Shortness of breath dyspnea    hx of 2013 - no problems currently    Sleep apnea    Thyroid nodule    history of    Wears glasses    Past Surgical History:  Procedure Laterality Date   ABDOMINAL HYSTERECTOMY     FOOT SURGERY  04/2018   [facility]   INCISIONAL HERNIA REPAIR N/A 07/06/2015   Procedure: INCISIONAL HERNIA REPAIR ;  Surgeon: Rolm Bookbinder, MD;  Location: [facility];  Service: General;  Laterality: N/A;   INSERTION OF MESH N/A 07/06/2015   Procedure: WITH INSERTION OF PHASIX ST MESH;  Surgeon: Rolm Bookbinder, MD;  Location: [facility];  Service: General;  Laterality: N/A;   LAPAROTOMY N/A 07/06/2015   Procedure: EXPLORATORY LAPAROTOMY;  Surgeon: Everitt Amber, MD;  Location: [facility];  Service: Gynecology;  Laterality: N/A;   LYSIS OF ADHESION N/A 07/06/2015   Procedure:  LYSIS OF ADHESION RESECTION OF MESENTERIC MASS BOWEL RESECTION ;  Surgeon: Everitt Amber, MD;  Location: [facility];  Service: Gynecology;  Laterality: N/A;   ROBOTIC ASSISTED TOTAL HYSTERECTOMY WITH BILATERAL SALPINGO OOPHERECTOMY Bilateral 07/28/2014   Procedure: ROBOTIC ASSISTED lysis of adhesions with biopsies, converted to LAPAROTOMY, bilateral salpingoorphorectomy, omentectomy,appendectomy;  Surgeon: Janie Morning, MD;  Location: [facility];  Service: Gynecology;  Laterality: Bilateral;   THYROIDECTOMY, PARTIAL     VIDEO BRONCHOSCOPY N/A 01/13/2020   Procedure: VIDEO BRONCHOSCOPY WITH BIOPSIES;  Surgeon: Melrose Nakayama, MD;  Location: [facility];  Service: Thoracic;  Laterality: N/A;   Family History  Problem Relation Age of Onset   Hypertension Mother    Hyperlipidemia Mother    Stroke Mother    Thyroid disease Mother    Hypertension Father    Diabetes Father    Hyperlipidemia Father    Sudden death Father    Cancer Sister 40       fibrosarcoma (back); currently 59   Prostate cancer Maternal Uncle    Colon cancer Paternal Aunt        Dx 89s; deceased 47s    Prostate cancer Paternal Uncle        currently 54   Cancer Paternal Uncle 25       "bone" ; unk. primary   Stomach cancer Paternal Uncle    Hypercalcemia Neg Hx    Social History   Socioeconomic History   Marital status: Married    Spouse name: Not on file   Number of children: 2   Years of education: Not on file   Highest education level: High school graduate  Occupational History   Occupation: disability  Tobacco Use   Smoking status: Never  Smokeless tobacco: Never  Vaping Use   Vaping Use: Never used  Substance and Sexual Activity   Alcohol use: No   Drug use: No   Sexual activity: Not on file  Other Topics Concern   Not on file  Social History Narrative   No issues with transportation.    Attends church   Social Determinants of Radio broadcast assistant Strain: Not on file  Food Insecurity: Not on file  Transportation Needs: Not on file  Physical Activity: Not on file  Stress: Not on file  Social Connections: Not on file    Tobacco Counseling Counseling given: No   Clinical Intake:  Pre-visit preparation completed: No  Pain : No/denies pain Pain Score: 0-No pain     BMI - recorded: 34.3 Nutritional Status: BMI > 30  Obese Nutritional Risks: None Diabetes: Yes CBG done?: No Did pt. bring in CBG monitor from home?: No  How often do you need to have someone help you when you read instructions, pamphlets, or other written materials from your doctor or pharmacy?: 1 - Never  Diabetic? Yes  Interpreter Needed?: No  Information entered by :: [place]   Activities of Daily Living In your present state of health, do you have any difficulty performing the following activities: 06/26/2021 06/26/2021  Hearing? N -  Vision? N -  Difficulty concentrating or making decisions? N -  Walking or climbing stairs? N -  Dressing or bathing? N -  Doing errands, shopping? N N  Preparing Food and eating ? N -  Using the Toilet? N -  In the past six months,  have you accidently leaked urine? Y -  Do you have problems with loss of bowel control? N -  Managing your Medications? N -  Managing your Finances? N -  Housekeeping or managing your Housekeeping? N -  Some recent data might be hidden    Patient Care Team: Ladell Pier, MD as PCP - General (Internal Medicine)  Indicate any recent Medical Services you may have received from other than [facility] providers in the past year (date may be approximate).     Assessment:   This is a routine wellness examination for Danyle.  Hearing/Vision screen No results found.  Dietary issues and exercise activities discussed: Current Exercise Habits: The patient does not participate in regular exercise at present   Goals Addressed   None   Depression Screen PHQ 2/9 Scores 06/26/2021 01/25/2021 04/28/2020 02/15/2020 02/15/2020 01/28/2020 01/24/2020  PHQ - 2 Score 0 0 0 5 4 0 0  PHQ- 9 Score - - - 16 8 - -    Fall Risk Fall Risk  06/26/2021 06/25/2021 01/25/2021 09/01/2020 04/28/2020  Falls in the past year? 0 0 0 0 0  Number falls in past yr: 0 0 0 0 0  Injury with Fall? 0 0 0 0 0  Risk for fall due to : - - No Fall Risks - -  Follow up Falls evaluation completed;Education provided;Falls prevention discussed - - - -    FALL RISK PREVENTION PERTAINING TO THE HOME:  Any stairs in or around the home? No  If so, are there any without handrails? No  Home free of loose throw rugs in walkways, pet beds, electrical cords, etc? Yes  Adequate lighting in your home to reduce risk of falls? Yes   ASSISTIVE DEVICES UTILIZED TO PREVENT FALLS:  Life alert? No  Use of a cane, walker or w/c? No  Grab bars in  the bathroom? No  Shower chair or bench in shower? No  Elevated toilet seat or a handicapped toilet? No   TIMED UP AND GO:  Was the test performed? Yes .  Length of time to ambulate 10 feet: < 5  sec.   Gait steady and fast without use of assistive device  Cognitive Function: MMSE - Mini Mental  State Exam 06/26/2021 01/28/2020  Orientation to time 5 5  Orientation to Place 5 5  Registration 3 3  Attention/ Calculation 5 5  Recall 3 3  Language- name 2 objects 2 2  Language- repeat 1 1  Language- follow 3 step command 3 3  Language- read & follow direction 1 1  Write a sentence 1 1  Copy design 1 1  Total score 30 30        Immunizations Immunization History  Administered Date(s) Administered   Influenza Whole 03/22/2019   Influenza,inj,Quad PF,6+ Mos 06/29/2014, 03/27/2015, 03/14/2016, 02/17/2017, 02/20/2018, 01/28/2020   Influenza-Unspecified 03/10/2021   PFIZER(Purple Top)SARS-COV-2 Vaccination 09/10/2019, 10/04/2019   Pfizer Covid-19 Vaccine Bivalent Booster 81yr & up 04/03/2021   Pneumococcal Conjugate (Pcv15) 05/28/2021   Pneumococcal Polysaccharide-23 06/29/2014   Tdap 11/17/2017   Zoster Recombinat (Shingrix) 01/28/2020, 04/28/2020    TDAP status: Up to date  Flu Vaccine status: Up to date  Pneumococcal vaccine status: Up to date  Covid-19 vaccine status: Completed vaccines  Qualifies for Shingles Vaccine? Yes   Zostavax completed No   Shingrix Completed?: Yes  Screening Tests Health Maintenance  Topic Date Due   HIV Screening  Never done   FOOT EXAM  01/23/2021   COVID-19 Vaccine (4 - Booster for Pfizer series) 05/29/2021   HEMOGLOBIN A1C  09/04/2021   OPHTHALMOLOGY EXAM  02/08/2022   MAMMOGRAM  03/21/2023   COLONOSCOPY (Pts 45-434yrInsurance coverage will need to be confirmed)  10/03/2026   TETANUS/TDAP  11/18/2027   Pneumococcal Vaccine [id number] old  Completed   INFLUENZA VACCINE  Completed   Hepatitis C Screening  Completed   Zoster Vaccines- Shingrix  Completed   HPV VACCINES  Aged Out    Health Maintenance  Health Maintenance Due  Topic Date Due   HIV Screening  Never done   FOOT EXAM  01/23/2021   COVID-19 Vaccine (4 - Booster for [place]) 05/29/2021    Colorectal cancer screening: Type of screening: Colonoscopy.  Completed 10/02/16. Repeat every 10 years  Mammogram status: Completed 03/20/2021. Repeat every year  Lung Cancer Screening: (Low Dose CT Chest recommended if Age 56-80ears, 30 pack-year currently smoking OR have quit w/in 15years.) does not qualify.   Lung Cancer Screening Referral: not needed  Additional Screening:  Hepatitis C Screening: does qualify; Completed 08/18/2010  Vision Screening: Recommended annual ophthalmology exams for early detection of glaucoma and other disorders of the eye. Is the patient up to date with their annual eye exam?  No  Who is the provider or what is the name of the office in which the patient attends annual eye exams? Dr. LiRolanda JayDental Screening: Recommended annual dental exams for proper oral hygiene  Community Resource Referral / Chronic Care Management: CRR required this visit?  No   CCM required this visit?  No      Plan:     I have personally reviewed and noted the following in the patients chart:   Medical and social history Use of alcohol, tobacco or illicit drugs  Current medications and supplements including opioid prescriptions.  Functional ability  and status Nutritional status Physical activity Advanced directives List of other physicians Hospitalizations, surgeries, and ER visits in previous 12 months Vitals Screenings to include cognitive, depression, and falls Referrals and appointments  In addition, I have reviewed and discussed with patient certain preventive protocols, quality metrics, and best practice recommendations. A written personalized care plan for preventive services as well as general preventive health recommendations were provided to patient.     Tresa Endo, RPH-CPP   06/26/2021

## 2021-06-27 LAB — VITAMIN D 25 HYDROXY (VIT D DEFICIENCY, FRACTURES): Vit D, 25-Hydroxy: 99.6 ng/mL (ref 30.0–100.0)

## 2021-06-27 LAB — HEMOGLOBIN A1C
Est. average glucose Bld gHb Est-mCnc: 131 mg/dL
Hgb A1c MFr Bld: 6.2 % — ABNORMAL HIGH (ref 4.8–5.6)

## 2021-06-27 LAB — VITAMIN B12: Vitamin B-12: 1389 pg/mL — ABNORMAL HIGH (ref 232–1245)

## 2021-06-27 NOTE — Progress Notes (Signed)
Anesthesia Chart Review:  Case: [id number] Date/Time: 07/03/21 0715   Procedures:      EXPLORATORY LAPAROTOMY WITH LYSIS OF ADHESION     INCISIONAL HERNIA REPAIR WITH MESH   Anesthesia type: General   Pre-op diagnosis: 10 cm RECURRENT INCISIONAL HERNIA   Location: [facility] ROOM 10 / [facility]   Surgeons: Ralene Ok, MD       DISCUSSION: Patient is a 56 year old female scheduled for the above procedure.  History includes never smoker, HTN, DM2, GERD, hiatal hernia, OSA (severe OSA 10/03/17; does not use CPAP), left thyroid lobectomy (10/23/10 for adenomatous goiter), hypercalcemia (see below for work-up details), supracervical hysterectomy/left salpingectomy (for AUB, left adnexal cyst 09/06/04), primary peritoneal carcinomatosis (s/p robotic assisted adhesiolysis, exploratory laparotomy with BSO, omenectomy, appendectomy 07/28/14; s/p chemo x6 cycles, completed 12/08/14; s/p exploratory laparotomy with LOA, small bowel resection, tumor debulking 07/06/15 for presumed recurrence, but benign pathology), 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 Roxan Hockey advised non-urgent resection/segmentectomy 07/18/20, not yet scheduled). BMI is consistent with obesity.   She had hematology and endocrinology evaluations in 2021 for hypercalcemia and elevated serum kappa free light chains. She had persistently elevated calcium level since June 2020 and mostly in the 11-12 range since July 2021. Initial work-up showed "normal PTH (on low end normal range), normal TSH, and normal D 1,25 (OH)2 and low VitD 25(OH)2, rpPTH was not elevated".  [name] advised holding HCTZ, and checking at least yearly. If persistently elevated then consider 24 Hour urine collection for calcium. This was done on 08/16/20 and was 81 (reference range 35-250, low calcium diet 35-200). Test results suggested her kidneys were not eliminating the calcium well, so Dr.  Loanne Drilling offered medication or just monitoring. [name] thought she likely had MGUS and ordered bone marrow biopsy to evaluate for multiple myeloma as a potential cause after her lung biopsy results were non-diagnostic. Bone marrow biopsy was done on 02/03/20 and showed, "normocellular bone marrow for age with trilineage hematopoiesis and 3% plasma cell.  Immunohistochemical stain for CD138 and in situ hybridization for kappa lambda light chain results showed polyclonal pattern, which is likely represent a reactive process, no definitive evidence of multiple myeloma or monoclonal process."  Cytogenetics report showed normal female karyotype in all cells analyzed. As needed HEM-ONC follow-up recommended at 02/10/20 visit. Recently, PCP Dr. Wynetta Emery re-referred patient back to endocrinology and hematology for follow-up, since she still had some intermittent Calcium levels over 12.0. Last Calcium on 06/27/19 was 11.3 mg/dL (corrected to 10.7 if using albumin 4.7 g/dL from 03/07/21 CMP).   Last evaluation by PCP Ladell Pier, MD was on 05/28/21 for routine follow-up of chronic medical conditions. She is aware of plans for ventral hernia repair, and notes patient without known issues with anesthesia. She wrote, "She is medically optimized."   Anesthesia team to evaluate on the day of surgery.    VS: BP (!) 145/84    Pulse 79    Temp 36.5 C (Oral)    Resp 18    Ht $R'5\' 5"'Dn$  (1.651 m)    Wt 93.5 kg    LMP  (LMP Unknown)    SpO2 100%    BMI 34.30 kg/m    PROVIDERS: Ladell Pier, MD is PCP ([facility]) - Renato Shin, MD is endocrinologist.  Last visit 07/24/20 for hypercalcemia. Dr. Wynetta Emery re-referred 05/29/21 for follow-up hypercalcemia. - Mellody Dance, DO is [facility] provider.  Last visit 05/21/21. Truitt Merle, MD is HEM-ONC. Last visit 02/10/20. As needed follow-up with ongoing follow-up with PCP and CT surgery (RLL nodule). Dr. Wynetta Emery re-referred  05/29/21 for follow-up hypercalcemia. - Modesto Charon, MD is CT surgery. Last visit 07/18/20.  She has a right lower lobe lung nodule that is slowly growing over time.  Nonspecific very low radiotracer activity on June 2021 PET scan. S/p bronchoscopy 01/2020 showed benign bronchial mucosa. He suspected she may have a low-grade carcinoid or bronchial adenoma or hamartoma. He discussed non-urgent right VATS/RLL wedge resection. She was going to call when she was ready to schedule. Everitt Amber, MD is GYN-ONC. Last visit 11/01/19.   LABS: Preoperative labs noted. A1c 6.2%. See DISCUSSION.     (all labs ordered are listed, but only abnormal results are displayed)  Labs Reviewed  GLUCOSE, CAPILLARY - Abnormal; Notable for the following components:      Result Value   Glucose-Capillary 124 (*)    All other components within normal limits  BASIC METABOLIC PANEL - Abnormal; Notable for the following components:   Glucose, Bld 127 (*)    Creatinine, Ser 1.17 (*)    Calcium 11.3 (*)    GFR, Estimated 55 (*)    All other components within normal limits  CBC    Sleep Study/Split Night CPAP 10/03/17: IMPRESSIONS - Severe obstructive sleep apnea occurred during the diagnostic portion of the study (AHI = 37.4/hour). An optimal PAP pressure was selected for this patient ( 12 cm of water) - No significant central sleep apnea occurred during the diagnostic portion of the study (CAI = 0.0/hour). - Mild oxygen desaturation was noted during the diagnostic portion of the study (Min O2 = 86.0%). - The patient snored with moderate snoring volume during the diagnostic portion of the study. - No cardiac abnormalities were noted during this study. - Clinically significant periodic limb movements did not occur during sleep. RECOMMENDATIONS - Trial of CPAP therapy on 12 cm H2O or autopap 5-15.  Patient used a Small size Fisher&Paykel Full Face Mask Simplus mask and heated humidification...   IMAGES: CT  Chest 07/11/20: IMPRESSION: - 11 mm right lower lobe nodule is mildly progressive, with slow growth from prior studies, without hypermetabolism on prior Dotatate and FDG PET studies. Indolent neoplasm such as pulmonary carcinoid is favored. - Aortic Atherosclerosis (ICD10-I70.0).  PET Scan 12/08/19: IMPRESSION: 1. Very low radiotracer activity associated with the RIGHT lobe pulmonary nodule is nonspecific. Findings would not be consistent with metastatic well-differentiated carcinoid tumor. Primary bronchial carcinoid can have low somatostatin receptor activity. Overall favor benign/indolent lesion. 2. No evidence of well differentiated neuroendocrine tumor within the abdomen pelvis.   US Thyroid 10/22/19: IMPRESSION: - Residual right lobe of the thyroid demonstrates spongiform and solid/cystic nodular areas that do not meet criteria for biopsy or further follow-up. - The above is in keeping with the ACR TI-RADS recommendations - J Am Coll Radiol 2017;14:587-595.    EKG: 06/26/21: Normal sinus rhythm Septal infarct , age undetermined Abnormal ECG When compared with ECG of 11-Jan-2020 10:20, No significant change since last tracing Confirmed by [place], Peter [phone]) on 06/26/2021 5:27:22 PM   CV: N/A   Past Medical History:  Diagnosis Date   Arthritis    Diabetes mellitus without complication ([place])    Family history of adverse reaction to anesthesia    sister-n/v   Family history of colon cancer    GERD (gastroesophageal reflux disease)    Glaucoma  07/2014   Glucagonoma 07/28/2014   Pt denies this but states she has glaucoma   History of chemotherapy    History of hiatal hernia    Hypercalcemia    Hypertension    Imbalance    Neuropathy    feet bilat   Nocturia    Peritoneal carcinomatosis ([place])    carcinoma of ovary    Shortness of breath dyspnea    hx of 2013 - no problems currently    Sleep apnea    Thyroid nodule    history of    Wears glasses     Past  Surgical History:  Procedure Laterality Date   ABDOMINAL HYSTERECTOMY     FOOT SURGERY  04/2018   [facility]   INCISIONAL HERNIA REPAIR N/A 07/06/2015   Procedure: INCISIONAL HERNIA REPAIR ;  Surgeon: Rolm Bookbinder, MD;  Location: [facility];  Service: General;  Laterality: N/A;   INSERTION OF MESH N/A 07/06/2015   Procedure: WITH INSERTION OF PHASIX ST MESH;  Surgeon: Rolm Bookbinder, MD;  Location: [facility];  Service: General;  Laterality: N/A;   LAPAROTOMY N/A 07/06/2015   Procedure: EXPLORATORY LAPAROTOMY;  Surgeon: Everitt Amber, MD;  Location: [facility];  Service: Gynecology;  Laterality: N/A;   LYSIS OF ADHESION N/A 07/06/2015   Procedure:  LYSIS OF ADHESION RESECTION OF MESENTERIC MASS BOWEL RESECTION ;  Surgeon: Everitt Amber, MD;  Location: [facility];  Service: Gynecology;  Laterality: N/A;   ROBOTIC ASSISTED TOTAL HYSTERECTOMY WITH BILATERAL SALPINGO OOPHERECTOMY Bilateral 07/28/2014   Procedure: ROBOTIC ASSISTED lysis of adhesions with biopsies, converted to LAPAROTOMY, bilateral salpingoorphorectomy, omentectomy,appendectomy;  Surgeon: Janie Morning, MD;  Location: [facility];  Service: Gynecology;  Laterality: Bilateral;   THYROIDECTOMY, PARTIAL     VIDEO BRONCHOSCOPY N/A 01/13/2020   Procedure: VIDEO BRONCHOSCOPY WITH BIOPSIES;  Surgeon: Melrose Nakayama, MD;  Location: [facility];  Service: Thoracic;  Laterality: N/A;    MEDICATIONS:  Accu-Chek Softclix Lancets lancets   albuterol (VENTOLIN HFA) 108 (90 Base) MCG/ACT inhaler   amLODipine (NORVASC) 10 MG tablet   aspirin-sod bicarb-citric acid (ALKA-SELTZER) 325 MG TBEF tablet   Blood Glucose Monitoring Suppl (ACCU-CHEK AVIVA PLUS) w/Device KIT   buPROPion (WELLBUTRIN SR) 150 MG 12 hr tablet   dorzolamide-timolol (COSOPT) 22.3-6.8 MG/ML ophthalmic solution   DROPLET PEN NEEDLES 29G X 12MM MISC   gabapentin (NEURONTIN) 600 MG tablet   glucose blood (ACCU-CHEK AVIVA PLUS) test strip   hydrALAZINE (APRESOLINE) 25 MG tablet   Lancets Misc.  (ACCU-CHEK SOFTCLIX LANCET DEV) KIT   LANTUS SOLOSTAR 100 UNIT/ML Solostar Pen   latanoprost (XALATAN) 0.005 % ophthalmic solution   levocetirizine (XYZAL) 5 MG tablet   losartan (COZAAR) 50 MG tablet   metFORMIN (GLUCOPHAGE) 1000 MG tablet   metoprolol tartrate (LOPRESSOR) 25 MG tablet   montelukast (SINGULAIR) 10 MG tablet   ondansetron (ZOFRAN) 4 MG tablet   rosuvastatin (CRESTOR) 20 MG tablet   tirzepatide (MOUNJARO) 10 MG/0.5ML Pen   vitamin B-12 (CYANOCOBALAMIN) 500 MCG tablet   Vitamin D, Ergocalciferol, (DRISDOL) 1.25 MG (50000 UNIT) CAPS capsule   No current facility-administered medications for this encounter.    Myra Gianotti, PA-C Surgical Short Stay/Anesthesiology [facility] Phone [phone] [facility] Phone [phone] 06/28/2021 11:28 AM

## 2021-06-28 ENCOUNTER — Encounter (HOSPITAL_COMMUNITY): Payer: Self-pay

## 2021-06-28 NOTE — Anesthesia Preprocedure Evaluation (Addendum)
Anesthesia Evaluation  Patient identified by MRN, date of birth, ID band Patient awake    Reviewed: Allergy & Precautions, H&P , NPO status , Patient's Chart, lab work & pertinent test results, reviewed documented beta blocker date and time   Airway Mallampati: II  TM Distance: >3 FB Neck ROM: Full    Dental no notable dental hx. (+) Teeth Intact, Dental Advisory Given   Pulmonary sleep apnea ,    Pulmonary exam normal breath sounds clear to auscultation       Cardiovascular Exercise Tolerance: Good hypertension, Pt. on medications and Pt. on home beta blockers  Rhythm:Regular Rate:Normal     Neuro/Psych negative neurological ROS  negative psych ROS   GI/Hepatic Neg liver ROS, hiatal hernia, GERD  Medicated,  Endo/Other  diabetes, Insulin Dependent  Renal/GU Renal InsufficiencyRenal disease  negative genitourinary   Musculoskeletal  (+) Arthritis , Osteoarthritis,    Abdominal   Peds  Hematology negative hematology ROS (+)   Anesthesia Other Findings   Reproductive/Obstetrics negative OB ROS                           Anesthesia Physical Anesthesia Plan  ASA: 3  Anesthesia Plan: General   Post-op Pain Management: Tylenol PO (pre-op)   Induction: Intravenous  PONV Risk Score and Plan: 4 or greater and Ondansetron, Dexamethasone and Midazolam  Airway Management Planned: Oral ETT  Additional Equipment:   Intra-op Plan:   Post-operative Plan: Extubation in OR  Informed Consent: I have reviewed the patients History and Physical, chart, labs and discussed the procedure including the risks, benefits and alternatives for the proposed anesthesia with the patient or authorized representative who has indicated his/her understanding and acceptance.     Dental advisory given  Plan Discussed with: CRNA  Anesthesia Plan Comments: (PAT note written 06/28/2021 by Myra Gianotti, PA-C. )       Anesthesia Quick Evaluation

## 2021-07-03 ENCOUNTER — Ambulatory Visit (HOSPITAL_COMMUNITY): Payer: Medicare HMO | Admitting: Vascular Surgery

## 2021-07-03 ENCOUNTER — Ambulatory Visit (HOSPITAL_COMMUNITY): Payer: Medicare HMO | Admitting: Anesthesiology

## 2021-07-03 ENCOUNTER — Encounter (HOSPITAL_COMMUNITY)
Admission: RE | Disposition: A | Discharge status: Home / Self Care | Payer: Self-pay | Source: Home / Self Care | Attending: General Surgery

## 2021-07-03 ENCOUNTER — Observation Stay (HOSPITAL_COMMUNITY)
Admission: RE | Admit: 2021-07-03 | Discharge: 2021-07-04 | Disposition: A | Discharge status: Home / Self Care | Payer: Medicare HMO | Attending: General Surgery | Admitting: General Surgery

## 2021-07-03 ENCOUNTER — Encounter (HOSPITAL_COMMUNITY): Payer: Self-pay | Admitting: General Surgery

## 2021-07-03 ENCOUNTER — Other Ambulatory Visit: Payer: Self-pay

## 2021-07-03 DIAGNOSIS — Z79899 Other long term (current) drug therapy: Secondary | ICD-10-CM | POA: Diagnosis not present

## 2021-07-03 DIAGNOSIS — K430 Incisional hernia with obstruction, without gangrene: Principal | ICD-10-CM | POA: Insufficient documentation

## 2021-07-03 DIAGNOSIS — K432 Incisional hernia without obstruction or gangrene: Secondary | ICD-10-CM | POA: Diagnosis not present

## 2021-07-03 DIAGNOSIS — K654 Sclerosing mesenteritis: Secondary | ICD-10-CM | POA: Diagnosis not present

## 2021-07-03 DIAGNOSIS — I10 Essential (primary) hypertension: Secondary | ICD-10-CM | POA: Diagnosis not present

## 2021-07-03 DIAGNOSIS — Z8719 Personal history of other diseases of the digestive system: Secondary | ICD-10-CM

## 2021-07-03 DIAGNOSIS — E119 Type 2 diabetes mellitus without complications: Secondary | ICD-10-CM | POA: Diagnosis not present

## 2021-07-03 DIAGNOSIS — Z794 Long term (current) use of insulin: Secondary | ICD-10-CM | POA: Diagnosis not present

## 2021-07-03 DIAGNOSIS — Z7984 Long term (current) use of oral hypoglycemic drugs: Secondary | ICD-10-CM | POA: Diagnosis not present

## 2021-07-03 DIAGNOSIS — Z8543 Personal history of malignant neoplasm of ovary: Secondary | ICD-10-CM | POA: Diagnosis not present

## 2021-07-03 DIAGNOSIS — Z7985 Long-term (current) use of injectable non-insulin antidiabetic drugs: Secondary | ICD-10-CM | POA: Insufficient documentation

## 2021-07-03 DIAGNOSIS — K660 Peritoneal adhesions (postprocedural) (postinfection): Secondary | ICD-10-CM | POA: Diagnosis not present

## 2021-07-03 HISTORY — PX: INCISIONAL HERNIA REPAIR: SHX193

## 2021-07-03 HISTORY — PX: LYSIS OF ADHESION: SHX5961

## 2021-07-03 LAB — GLUCOSE, CAPILLARY
Glucose-Capillary: 88 mg/dL (ref 70–99)
Glucose-Capillary: 101 mg/dL — ABNORMAL HIGH (ref 70–99)
Glucose-Capillary: 97 mg/dL (ref 70–99)
Glucose-Capillary: 179 mg/dL — ABNORMAL HIGH (ref 70–99)
Glucose-Capillary: 139 mg/dL — ABNORMAL HIGH (ref 70–99)

## 2021-07-03 SURGERY — LAPAROTOMY, FOR LYSIS OF ADHESIONS
Anesthesia: General | Site: Abdomen

## 2021-07-03 MED ORDER — ASPIRIN EFFERVESCENT 325 MG PO TBEF: 650.0000 mg | EFFERVESCENT_TABLET | Freq: Four times a day (QID) | ORAL | Status: DC | PRN

## 2021-07-03 MED ORDER — LOSARTAN POTASSIUM 50 MG PO TABS
50.0000 mg | ORAL_TABLET | Freq: Every day | ORAL | Status: DC
  Administered 2021-07-03 – 2021-07-04 (×2): 50 mg via ORAL
  Filled 2021-07-03 (×2): qty 1

## 2021-07-03 MED ORDER — METFORMIN HCL 500 MG PO TABS
1000.0000 mg | ORAL_TABLET | Freq: Two times a day (BID) | ORAL | Status: DC
  Administered 2021-07-03 – 2021-07-04 (×2): 1000 mg via ORAL
  Filled 2021-07-03 (×3): qty 2

## 2021-07-03 MED ORDER — LACTATED RINGERS IV SOLN: INTRAVENOUS | Status: DC

## 2021-07-03 MED ORDER — INSULIN ASPART 100 UNIT/ML IJ SOLN
0.0000 [IU] | Freq: Three times a day (TID) | INTRAMUSCULAR | Status: DC
  Administered 2021-07-03: 18:00:00 4 [IU] via SUBCUTANEOUS

## 2021-07-03 MED ORDER — OXYCODONE HCL 5 MG PO TABS
5.0000 mg | ORAL_TABLET | ORAL | Status: DC | PRN
  Administered 2021-07-03 – 2021-07-04 (×2): 10 mg via ORAL
  Filled 2021-07-03 (×2): qty 2

## 2021-07-03 MED ORDER — SODIUM CHLORIDE 0.9 % IV SOLN
INTRAVENOUS | Status: DC | PRN
  Administered 2021-07-03: 10:00:00 40 mL

## 2021-07-03 MED ORDER — FENTANYL CITRATE (PF) 250 MCG/5ML IJ SOLN
INTRAMUSCULAR | Status: DC | PRN
Start: 2021-07-03 — End: 2021-07-03
  Administered 2021-07-03: 50 ug via INTRAVENOUS
  Administered 2021-07-03: 100 ug via INTRAVENOUS

## 2021-07-03 MED ORDER — ROCURONIUM BROMIDE 10 MG/ML (PF) SYRINGE
PREFILLED_SYRINGE | INTRAVENOUS | Status: DC | PRN
Start: 2021-07-03 — End: 2021-07-03
  Administered 2021-07-03: 60 mg via INTRAVENOUS
  Administered 2021-07-03 (×2): 20 mg via INTRAVENOUS

## 2021-07-03 MED ORDER — DEXAMETHASONE SODIUM PHOSPHATE 10 MG/ML IJ SOLN
INTRAMUSCULAR | Status: DC | PRN
  Administered 2021-07-03: 5 mg via INTRAVENOUS

## 2021-07-03 MED ORDER — BUPROPION HCL ER (SR) 150 MG PO TB12
150.0000 mg | ORAL_TABLET | Freq: Every morning | ORAL | Status: DC
  Administered 2021-07-03 – 2021-07-04 (×2): 150 mg via ORAL
  Filled 2021-07-03 (×2): qty 1

## 2021-07-03 MED ORDER — LIDOCAINE 2% (20 MG/ML) 5 ML SYRINGE
INTRAMUSCULAR | Status: DC | PRN
  Administered 2021-07-03: 80 mg via INTRAVENOUS

## 2021-07-03 MED ORDER — ACETAMINOPHEN 500 MG PO TABS
1000.0000 mg | ORAL_TABLET | ORAL | Status: AC
  Administered 2021-07-03: 06:00:00 1000 mg via ORAL
  Filled 2021-07-03: qty 2

## 2021-07-03 MED ORDER — GABAPENTIN 600 MG PO TABS
600.0000 mg | ORAL_TABLET | Freq: Three times a day (TID) | ORAL | Status: DC
  Administered 2021-07-03 – 2021-07-04 (×4): 600 mg via ORAL
  Filled 2021-07-03 (×4): qty 1

## 2021-07-03 MED ORDER — HYDROMORPHONE HCL 1 MG/ML IJ SOLN
INTRAMUSCULAR | Status: AC
  Filled 2021-07-03: qty 1

## 2021-07-03 MED ORDER — ALBUTEROL SULFATE HFA 108 (90 BASE) MCG/ACT IN AERS
2.0000 | INHALATION_SPRAY | Freq: Four times a day (QID) | RESPIRATORY_TRACT | Status: DC | PRN
  Filled 2021-07-03: qty 6.7

## 2021-07-03 MED ORDER — VISTASEAL 10 ML SINGLE DOSE KIT
PACK | CUTANEOUS | Status: DC | PRN
Start: 2021-07-03 — End: 2021-07-03
  Administered 2021-07-03: 10 mL via TOPICAL

## 2021-07-03 MED ORDER — ALBUTEROL SULFATE (2.5 MG/3ML) 0.083% IN NEBU: 2.5000 mg | INHALATION_SOLUTION | Freq: Four times a day (QID) | RESPIRATORY_TRACT | Status: DC | PRN

## 2021-07-03 MED ORDER — FENTANYL CITRATE (PF) 250 MCG/5ML IJ SOLN
INTRAMUSCULAR | Status: AC
  Filled 2021-07-03: qty 5

## 2021-07-03 MED ORDER — ONDANSETRON HCL 4 MG/2ML IJ SOLN
INTRAMUSCULAR | Status: DC | PRN
Start: 2021-07-03 — End: 2021-07-03
  Administered 2021-07-03: 4 mg via INTRAVENOUS

## 2021-07-03 MED ORDER — ONDANSETRON HCL 4 MG/2ML IJ SOLN
4.0000 mg | Freq: Four times a day (QID) | INTRAMUSCULAR | Status: DC | PRN
  Filled 2021-07-03: qty 2

## 2021-07-03 MED ORDER — CHLORHEXIDINE GLUCONATE 0.12 % MT SOLN
15.0000 mL | Freq: Once | OROMUCOSAL | Status: AC
  Administered 2021-07-03: 06:00:00 15 mL via OROMUCOSAL
  Filled 2021-07-03: qty 15

## 2021-07-03 MED ORDER — ONDANSETRON 4 MG PO TBDP
4.0000 mg | ORAL_TABLET | Freq: Four times a day (QID) | ORAL | Status: DC | PRN
  Filled 2021-07-03: qty 1

## 2021-07-03 MED ORDER — HYDROMORPHONE HCL 1 MG/ML IJ SOLN
0.2500 mg | INTRAMUSCULAR | Status: DC | PRN
  Administered 2021-07-03: 11:00:00 0.5 mg via INTRAVENOUS

## 2021-07-03 MED ORDER — ENOXAPARIN SODIUM 30 MG/0.3ML IJ SOSY
30.0000 mg | PREFILLED_SYRINGE | INTRAMUSCULAR | Status: DC
  Administered 2021-07-04: 09:00:00 30 mg via SUBCUTANEOUS
  Filled 2021-07-03: qty 0.3

## 2021-07-03 MED ORDER — SODIUM CHLORIDE 0.9 % IV SOLN: INTRAVENOUS | Status: DC | PRN

## 2021-07-03 MED ORDER — DEXTROSE 5 % IV SOLN: INTRAVENOUS | Status: DC

## 2021-07-03 MED ORDER — ORAL CARE MOUTH RINSE: 15.0000 mL | Freq: Once | OROMUCOSAL | Status: AC

## 2021-07-03 MED ORDER — MIDAZOLAM HCL 2 MG/2ML IJ SOLN
INTRAMUSCULAR | Status: DC | PRN
Start: 2021-07-03 — End: 2021-07-03
  Administered 2021-07-03: 2 mg via INTRAVENOUS

## 2021-07-03 MED ORDER — PROPOFOL 10 MG/ML IV BOLUS
INTRAVENOUS | Status: DC | PRN
  Administered 2021-07-03: 160 mg via INTRAVENOUS

## 2021-07-03 MED ORDER — BUPIVACAINE LIPOSOME 1.3 % IJ SUSP
INTRAMUSCULAR | Status: AC
  Filled 2021-07-03: qty 20

## 2021-07-03 MED ORDER — CHLORHEXIDINE GLUCONATE CLOTH 2 % EX PADS: 6.0000 | MEDICATED_PAD | Freq: Once | CUTANEOUS | Status: DC

## 2021-07-03 MED ORDER — MIDAZOLAM HCL 2 MG/2ML IJ SOLN
INTRAMUSCULAR | Status: AC
  Filled 2021-07-03: qty 2

## 2021-07-03 MED ORDER — PROPOFOL 10 MG/ML IV BOLUS
INTRAVENOUS | Status: AC
  Filled 2021-07-03: qty 20

## 2021-07-03 MED ORDER — LIP MEDEX EX OINT
TOPICAL_OINTMENT | CUTANEOUS | Status: DC | PRN
  Filled 2021-07-03: qty 7

## 2021-07-03 MED ORDER — HYDROMORPHONE HCL 1 MG/ML IJ SOLN
1.0000 mg | INTRAMUSCULAR | Status: DC | PRN
  Administered 2021-07-03: 16:00:00 1 mg via INTRAVENOUS
  Filled 2021-07-03: qty 1

## 2021-07-03 MED ORDER — 0.9 % SODIUM CHLORIDE (POUR BTL) OPTIME
TOPICAL | Status: DC | PRN
  Administered 2021-07-03 (×2): 1000 mL

## 2021-07-03 MED ORDER — CEFAZOLIN SODIUM-DEXTROSE 2-4 GM/100ML-% IV SOLN
2.0000 g | INTRAVENOUS | Status: AC
  Administered 2021-07-03: 08:00:00 2 g via INTRAVENOUS
  Filled 2021-07-03: qty 100

## 2021-07-03 MED ORDER — SUGAMMADEX SODIUM 200 MG/2ML IV SOLN
INTRAVENOUS | Status: DC | PRN
Start: 2021-07-03 — End: 2021-07-03
  Administered 2021-07-03: 300 mg via INTRAVENOUS

## 2021-07-03 MED ORDER — DORZOLAMIDE HCL-TIMOLOL MAL 2-0.5 % OP SOLN
1.0000 [drp] | Freq: Two times a day (BID) | OPHTHALMIC | Status: DC
  Administered 2021-07-03 – 2021-07-04 (×2): 1 [drp] via OPHTHALMIC
  Filled 2021-07-03: qty 10

## 2021-07-03 MED ORDER — AMLODIPINE BESYLATE 10 MG PO TABS
10.0000 mg | ORAL_TABLET | Freq: Every day | ORAL | Status: DC
  Administered 2021-07-03 – 2021-07-04 (×2): 10 mg via ORAL
  Filled 2021-07-03 (×2): qty 1

## 2021-07-03 MED ORDER — PHENYLEPHRINE HCL-NACL 20-0.9 MG/250ML-% IV SOLN
INTRAVENOUS | Status: DC | PRN
  Administered 2021-07-03: 20 ug/min via INTRAVENOUS

## 2021-07-03 MED ORDER — CALCIUM CARBONATE ANTACID 500 MG PO CHEW: 1.0000 | CHEWABLE_TABLET | Freq: Four times a day (QID) | ORAL | Status: DC | PRN

## 2021-07-03 SURGICAL SUPPLY — 56 items
BAG COUNTER SPONGE SURGICOUNT (BAG) ×2 IMPLANT
BAG SURGICOUNT SPONGE COUNTING (BAG) ×1
BINDER ABDOMINAL 12 ML 46-62 (SOFTGOODS) ×2 IMPLANT
BIOPATCH RED 1 DISK 7.0 (GAUZE/BANDAGES/DRESSINGS) ×1 IMPLANT
BIOPATCH RED 1IN DISK 7.0MM (GAUZE/BANDAGES/DRESSINGS) ×1
BLADE CLIPPER SURG (BLADE) IMPLANT
BLADE SURG 11 STRL SS (BLADE) ×3 IMPLANT
CNTNR URN SCR LID CUP LEK RST (MISCELLANEOUS) IMPLANT
CONT SPEC 4OZ STRL OR WHT (MISCELLANEOUS) ×3
COVER SURGICAL LIGHT HANDLE (MISCELLANEOUS) ×3 IMPLANT
DEVICE TROCAR PUNCTURE CLOSURE (ENDOMECHANICALS) IMPLANT
DRAIN CHANNEL 19F RND (DRAIN) ×2 IMPLANT
DRAPE INCISE IOBAN 66X45 STRL (DRAPES) IMPLANT
DRAPE LAPAROSCOPIC ABDOMINAL (DRAPES) ×3 IMPLANT
DRSG OPSITE POSTOP 4X10 (GAUZE/BANDAGES/DRESSINGS) ×2 IMPLANT
DRSG OPSITE POSTOP 4X8 (GAUZE/BANDAGES/DRESSINGS) ×3 IMPLANT
DRSG TEGADERM 4X4.75 (GAUZE/BANDAGES/DRESSINGS) ×2 IMPLANT
ELECT BLADE 6.5 EXT (BLADE) ×2 IMPLANT
ELECT REM PT RETURN 9FT ADLT (ELECTROSURGICAL) ×3
ELECTRODE REM PT RTRN 9FT ADLT (ELECTROSURGICAL) ×1 IMPLANT
EVACUATOR SILICONE 100CC (DRAIN) ×2 IMPLANT
GLOVE SRG 8 PF TXTR STRL LF DI (GLOVE) ×1 IMPLANT
GLOVE SURG SYN 7.5  E (GLOVE) ×3
GLOVE SURG SYN 7.5 E (GLOVE) ×1 IMPLANT
GLOVE SURG SYN 7.5 PF PI (GLOVE) ×1 IMPLANT
GLOVE SURG UNDER POLY LF SZ8 (GLOVE) ×3
GOWN STRL REUS W/ TWL LRG LVL3 (GOWN DISPOSABLE) ×1 IMPLANT
GOWN STRL REUS W/ TWL XL LVL3 (GOWN DISPOSABLE) ×1 IMPLANT
GOWN STRL REUS W/TWL LRG LVL3 (GOWN DISPOSABLE) ×3
GOWN STRL REUS W/TWL XL LVL3 (GOWN DISPOSABLE) ×3
KIT BASIN OR (CUSTOM PROCEDURE TRAY) ×3 IMPLANT
KIT TURNOVER KIT B (KITS) ×3 IMPLANT
MANIFOLD NEPTUNE II (INSTRUMENTS) ×2 IMPLANT
MESH PROLENE PML 12X12 (Mesh General) ×2 IMPLANT
NEEDLE HYPO 22GX1.5 SAFETY (NEEDLE) ×2 IMPLANT
NS IRRIG 1000ML POUR BTL (IV SOLUTION) ×3 IMPLANT
PACK GENERAL/GYN (CUSTOM PROCEDURE TRAY) ×3 IMPLANT
PAD ARMBOARD 7.5X6 YLW CONV (MISCELLANEOUS) ×6 IMPLANT
RETAINER VISCERA MED (MISCELLANEOUS) ×2 IMPLANT
SPONGE T-LAP 18X18 ~~LOC~~+RFID (SPONGE) ×6 IMPLANT
STAPLER VISISTAT (STAPLE) ×2 IMPLANT
STAPLER VISISTAT 35W (STAPLE) IMPLANT
SUT ETHILON 3 0 FSL (SUTURE) ×2 IMPLANT
SUT NOVA NAB GS-21 0 18 T12 DT (SUTURE) IMPLANT
SUT PDS AB 0 CT 36 (SUTURE) IMPLANT
SUT PDS AB 1 TP1 54 (SUTURE) ×4 IMPLANT
SUT VIC AB 2-0 SH 27 (SUTURE) ×18
SUT VIC AB 2-0 SH 27X BRD (SUTURE) IMPLANT
SUT VICRYL AB 2 0 TIES (SUTURE) ×1 IMPLANT
SYR 20ML LL LF (SYRINGE) ×2 IMPLANT
SYR CONTROL 10ML LL (SYRINGE) IMPLANT
TOWEL GREEN STERILE (TOWEL DISPOSABLE) ×3 IMPLANT
TOWEL GREEN STERILE FF (TOWEL DISPOSABLE) ×3 IMPLANT
TRAY FOLEY W/BAG SLVR 16FR (SET/KITS/TRAYS/PACK)
TRAY FOLEY W/BAG SLVR 16FR ST (SET/KITS/TRAYS/PACK) IMPLANT
WATER STERILE IRR 1000ML POUR (IV SOLUTION) ×3 IMPLANT

## 2021-07-03 NOTE — Anesthesia Procedure Notes (Signed)
Procedure Name: Intubation Date/Time: 07/03/2021 7:45 AM Performed by: Thelma Comp, CRNA Pre-anesthesia Checklist: Patient identified, Emergency Drugs available, Suction available and Patient being monitored Patient Re-evaluated:Patient Re-evaluated prior to induction Oxygen Delivery Method: Circle System Utilized Preoxygenation: Pre-oxygenation with 100% oxygen Induction Type: IV induction Ventilation: Mask ventilation without difficulty Laryngoscope Size: Mac and 3 Grade View: Grade II Tube type: Oral Tube size: 7.0 mm Number of attempts: 1 Airway Equipment and Method: Stylet and Oral airway Placement Confirmation: ETT inserted through vocal cords under direct vision, positive ETCO2 and breath sounds checked- equal and bilateral Secured at: 22 cm Tube secured with: Tape Dental Injury: Teeth and Oropharynx as per pre-operative assessment

## 2021-07-03 NOTE — Anesthesia Postprocedure Evaluation (Signed)
Anesthesia Post Note  Patient: Sarah [name]  Procedure(s) Performed: EXPLORATORY LAPAROTOMY WITH LYSIS OF ADHESION (Abdomen) INCISIONAL HERNIA REPAIR WITH MESH (Abdomen)     Patient location during evaluation: PACU Anesthesia Type: General Level of consciousness: awake and alert Pain management: pain level controlled Vital Signs Assessment: post-procedure vital signs reviewed and stable Respiratory status: spontaneous breathing, nonlabored ventilation, respiratory function stable and patient connected to nasal cannula oxygen Cardiovascular status: blood pressure returned to baseline and stable Postop Assessment: no apparent nausea or vomiting Anesthetic complications: no   No notable events documented.  Last Vitals:  Vitals:   07/03/21 1140 07/03/21 1210  BP: 126/79 126/79  Pulse: 78 74  Resp: 19 20  Temp:    SpO2: 94% 95%    Last Pain:  Vitals:   07/03/21 1210  TempSrc:   PainSc: Asleep                 Kathryn Linarez,W. EDMOND

## 2021-07-03 NOTE — Op Note (Signed)
07/03/2021  9:52 AM  PATIENT:  Sarah [name]  56 y.o. female  PRE-OPERATIVE DIAGNOSIS:  10 cm RECURRENT INCISIONAL HERNIA  POST-OPERATIVE DIAGNOSIS:  10 cm RECURRENT INCARCERATED INCISIONAL HERNIA  PROCEDURE:  Procedure(s): EXPLORATORY LAPAROTOMY WITH LYSIS OF ADHESION x 65 minutes (N/A) INCISIONAL HERNIA REPAIR WITH MESH, onlay type (N/A)  SURGEON:  Surgeon(s) and Role:    * Ralene Ok, MD - Primary  ASSISTANTS: Baltazar Apo, MD, PGY-5  ANESTHESIA:   local and general  EBL:  minimal   BLOOD ADMINISTERED:none  DRAINS: (18Fr) Jackson-Pratt drain(s) with closed bulb suction in the SQ space   LOCAL MEDICATIONS USED:  OTHER exparil  SPECIMEN:  Source of Specimen:  abdominal wall mass x2  DISPOSITION OF SPECIMEN:  PATHOLOGY  COUNTS:  YES  TOURNIQUET:  * No tourniquets in log *  DICTATION: .Dragon Dictation  Indication procedure: Patient is a 56 year old female who comes in secondary to a recurrent incisional hernia.  Patient had previous bilateral salpingo-oophorectomy omentectomy secondary to cancer.  Patient also metastasis to the liver.  Patient had a mesh placed in the retrorectus space.  The hernia recurred.  Patient was having symptoms of abdominal pain to the area.  She elected for elective hernia repair.  Findings: Patient had a 10 x 15 cm incarcerated recurrent incisional hernia.  Patient had significant amount of small bowel adhesions to the hernia sac.  These were taken down circumferentially.  The adhesiolysis was fairly thin.  This was approximately 65 minutes.  The muscle layer was able to be reapproximated to the midline.  The subcutaneous space was created and a 25 x 15 cm mesh was to fit into the space.  There was an onlay then placed.  This was secured using 2-0 Vicryl sutures and fibrin glue in the midline.  Subcutaneous space was reapproximated using 2-0 Vicryl's in interrupted fashion and a 80 [place] Blake drain was then placed in the subcutaneous  over the mesh.  This was brought out via stab incision right lower quadrant.  Details of procedure: After the patient was consented patient was taken back to the operative placed in supine position with bilateral sedation placed.  Patient was placed under general endotracheal intubation.  Patient was then prepped and draped standard fashion.  A timeout was called and all facts were verified.  At this time a #10 blade was used to create a midline incision.  Cautery was used to maintain hemostasis dissection was taken down to the hernia sac.  The hernia sac was then entered.  The hernia sac was then extended to the length of the skin incision.  There was some small bowel adherent to the hernia sac.  This was cauterized and dissected off the hernia sac itself.  At this time the fascia was found.  At this time there was 2 firm round masses that were found in the inferior portion of the wound site.  These were excised and sent to pathology.  There was significant mount of small bowel adhesions to the anterior abdominal wall circumferentially.  These were taken down and care was taken not to create an enterotomy.  This was done with both cautery and sharp dissection.  At this time the hernia itself was measured.  This was approximately 10 x 15 cm.  At this time the fascia was unable to be medialized with undue tension.  Since the retrorectus space was obliterated from his previous surgery.  I decided to place an onlay mesh.  Subcutaneous flaps were  then raised bilaterally.  This was done with cautery.  At this time the fascia was then reapproximated midline using a #1 PDS single-stranded in a standard running fashion.  At this time the area was measured.  This measured 25 x 15 cm.  At this time a Prolene mesh was chosen and placed in the only position.  This was secured circumferentially using 2-0 Vicryl's.  Fibrin glue was then used to be placed in the midline to help adhere the mesh to the midline.  At this  time a [place] drain was then placed in the right lower quadrant brought to the subcutaneous space.  This was placed over the mesh.  At this time the subcutaneous space was brought together using interrupted 2-0 Vicryl's.  The skin was stapled with skin staples.  The patient taught the procedure well was taken to the recovery in stable condition.   PLAN OF CARE: Admit for overnight observation  PATIENT DISPOSITION:  PACU - hemodynamically stable.   Delay start of Pharmacological VTE agent (>24hrs) due to surgical blood loss or risk of bleeding: not applicable

## 2021-07-03 NOTE — Discharge Instructions (Signed)
[facility]  HERNIA REPAIR: POST OP INSTRUCTIONS  Always review your discharge instruction sheet given to you by the facility where your surgery was performed. IF YOU HAVE DISABILITY OR FAMILY LEAVE FORMS, YOU MUST BRING THEM TO THE OFFICE FOR PROCESSING.   DO NOT GIVE THEM TO YOUR DOCTOR.  1. A  prescription for pain medication may be given to you upon discharge.  Take your pain medication as prescribed, if needed.  If narcotic pain medicine is not needed, then you may take acetaminophen (Tylenol) or ibuprofen (Advil) as needed. 2. Take your usually prescribed medications unless otherwise directed. If you need a refill on your pain medication, please contact your pharmacy.  They will contact our office to request authorization. Prescriptions will not be filled after 5 pm or on week-ends. 3. You should follow a light diet the first 24 hours after arrival home, such as soup and crackers, etc.  Be sure to include lots of fluids daily.  Resume your normal diet the day after surgery. 4.Most patients will experience some swelling and bruising around the umbilicus or in the groin and scrotum.  Ice packs and reclining will help.  Swelling and bruising can take several days to resolve.  6. It is common to experience some constipation if taking pain medication after surgery.  Increasing fluid intake and taking a stool softener (such as Colace) will usually help or prevent this problem from occurring.  A mild laxative (Milk of Magnesia or Miralax) should be taken according to package directions if there are no bowel movements after 48 hours. 7. Unless discharge instructions indicate otherwise, you may remove your bandages 24-48 hours after surgery, and you may shower at that time.  You may have steri-strips (small skin tapes) in place directly over the incision.  These strips should be left on the skin for 7-10 days.  If your surgeon used skin glue on the incision, you may shower in  24 hours.  The glue will flake off over the next 2-3 weeks.  Any sutures or staples will be removed at the office during your follow-up visit. 8. ACTIVITIES:  You may resume regular (light) daily activities beginning the next day--such as daily self-care, walking, climbing stairs--gradually increasing activities as tolerated.  You may have sexual intercourse when it is comfortable.  Refrain from any heavy lifting or straining until approved by your doctor.  a.You may drive when you are no longer taking prescription pain medication, you can comfortably wear a seatbelt, and you can safely maneuver your car and apply brakes. b.RETURN TO WORK:   _____________________________________________  9.You should see your doctor in the office for a follow-up appointment approximately 2-3 weeks after your surgery.  Make sure that you call for this appointment within a day or two after you arrive home to insure a convenient appointment time. 10.OTHER INSTRUCTIONS: _________________________    _____________________________________  WHEN TO CALL YOUR DOCTOR: Fever over 101.0 Inability to urinate Nausea and/or vomiting Extreme swelling or bruising Continued bleeding from incision. Increased pain, redness, or drainage from the incision  The clinic staff is available to answer your questions during regular business hours.  Please don't hesitate to call and ask to speak to one of the nurses for clinical concerns.  If you have a medical emergency, go to the nearest emergency room or call 911.  A surgeon from [facility] is always on call at the hospital   [address], [address], [place], [place]    [address] ?  [address] [address], [place], [place]   [address] [phone] ? [phone] ? FAX [phone] Web site: [email or web address]  

## 2021-07-03 NOTE — Transfer of Care (Signed)
Immediate Anesthesia Transfer of Care Note  Patient: Sarah [name]  Procedure(s) Performed: EXPLORATORY LAPAROTOMY WITH LYSIS OF ADHESION (Abdomen) INCISIONAL HERNIA REPAIR WITH MESH (Abdomen)  Patient Location: PACU  Anesthesia Type:General  Level of Consciousness: drowsy and patient cooperative  Airway & Oxygen Therapy: Patient Spontanous Breathing and Patient connected to nasal cannula oxygen  Post-op Assessment: Report given to RN and Post -op Vital signs reviewed and stable  Post vital signs: Reviewed and stable  Last Vitals:  Vitals Value Taken Time  BP 145/93 07/03/21 1009  Temp    Pulse 71 07/03/21 1013  Resp 11 07/03/21 1013  SpO2 98 % 07/03/21 1013  Vitals shown include unvalidated device data.  Last Pain:  Vitals:   07/03/21 0614  TempSrc:   PainSc: 0-No pain         Complications: No notable events documented.

## 2021-07-03 NOTE — H&P (Signed)
Chief Complaint: Follow-up       History of Present Illness: Sarah [name] is a 56 y.o. female who is seen today as an office consultation at the request of Dr. None for evaluation of Follow-up .   Patient is a 56 year old female with a history of diabetes, hypertension who comes in secondary to a recurrent incisional hernia. Patient previously had ovarian cancer status post ex lap, BSO, omentectomy, appendectomy in 2016.  Patient had a metastasis involving the liver.  She was stage IIIc.  Patient underwent chemotherapy.  At that time patient underwent retrorectus repair with phasic's mesh.  This was placed in the preperitoneal space.  Patient states that subsequently approximately 1 year after surgery did hernia recurred.   Patient states that she is continue to had some discomfort with the hernia.   Patient's had ongoing weight loss and has looking towards losing more weight and her health plan.   Patient's had no signs or symptoms of incarceration or strangulation.   As she had no previous other abdominal operations.   Patient did have a CT scan recently [name] reviewed personally.  Patient CT scan reveals a 10 cm wide upper hernia.  There appears to be a posterior rectus sheath separation inferiorly as well.         Review of Systems: A complete review of systems was obtained from the patient.  I have reviewed this information and discussed as appropriate with the patient.  See HPI as well for other ROS.   Review of Systems  Constitutional: Negative for fever.  HENT: Negative for congestion.   Eyes: Negative for blurred vision.  Respiratory: Negative for cough, shortness of breath and wheezing.   Cardiovascular: Negative for chest pain and palpitations.  Gastrointestinal: Negative for heartburn.  Genitourinary: Negative for dysuria.  Musculoskeletal: Negative for myalgias.  Skin: Negative for rash.  Neurological: Negative for dizziness and headaches.  Psychiatric/Behavioral:  Negative for depression and suicidal ideas.  All other systems reviewed and are negative.       Medical History: Past Medical History Past Medical History: Diagnosis        Date            Diabetes mellitus without complication (CMS-HCC)               Glaucoma (increased eye pressure)               Hyperlipidemia                         Hypertension                Sleep apnea            There is no problem list on file for this patient.     Past Surgical History Past Surgical History: Procedure       Laterality         Date            HERNIA REPAIR                             Allergies Allergies Allergen           Reactions            Aprepitant        Other (See Comments)  Urticaria            Lisinopril          Cough       Current Outpatient Medications on File Prior to Visit Medication       Sig       Dispense         Refill            albuterol 90 mcg/actuation inhaler     Inhale into the lungs                              amLODIPine (NORVASC) 10 MG tablet        Take 1 tablet (10 mg total) by mouth once daily                                 buPROPion (WELLBUTRIN SR) 150 MG SR tablet  Take by mouth                                      COVID-19 vac, bv, Pfizer,,PF, (PFIZER COVID BIVAL,12Y UP,,PF,) 30 mcg/0.3 mL IM injection           Inject into the muscle                            cyanocobalamin (VITAMIN B12) 500 MCG tablet     Take by mouth                                      ergocalciferol, vitamin D2, 1,250 mcg (50,000 unit) capsule 1 po q 10 days                                      gabapentin (NEURONTIN) 600 MG tablet     Take 1 tablet (600 mg total) by mouth 3 (three) times daily                              hydrALAZINE (APRESOLINE) 25 MG tablet TAKE 1 AND 1/2 TABLETS THREE TIMES DAILY                           lancets (ACCU-CHEK SOFTCLIX LANCETS)           Check BS three times a day                             lancing device with  lancets (ACCU-CHEK SOFT DEV LANCETS) kit          Check blood sugars 3 times a day.                             levocetirizine (XYZAL) 5 MG tablet    Take 1 tablet (5 mg total) by mouth every evening  losartan (COZAAR) 50 MG tablet       Take 1 tablet (50 mg total) by mouth once daily                              meloxicam (MOBIC) 15 MG tablet     Take 1 tablet (15 mg total) by mouth once daily                              metFORMIN (GLUCOPHAGE) 1000 MG tablet        Take 1 tablet (1,000 mg total) by mouth 2 (two) times daily with meals                                 metoprolol tartrate (LOPRESSOR) 25 MG tablet      Take 0.5 tablets (12.5 mg total) by mouth 2 (two) times daily                               montelukast (SINGULAIR) 10 mg tablet        Take by mouth                                      rosuvastatin (CRESTOR) 20 MG tablet         Take 1 tablet (20 mg total) by mouth once daily                                 semaglutide (OZEMPIC) 2 mg/dose (8 mg/3 mL) pen injector         Inject 37m under the skin once a week                             ACCU-CHEK AVIVA PLUS TEST STRP test strip                                           cephalexin (KEFLEX) 500 MG capsule          TAKE 1 CAPSULE BY MOUTH THREE TIMES DAILY FOR 10 DAYS                                    dorzolamide-timoloL (COSOPT) 22.3-6.8 mg/mL ophthalmic solution                                                DROPLET PEN NEEDLE 29 gauge x 1/2" needle                                           glimepiride (AMARYL) 4 MG tablet  hydroCHLOROthiazide (HYDRODIURIL) 25 MG tablet                                               LANTUS SOLOSTAR U-100 INSULIN pen injector (concentration 100 units/mL)                                          latanoprost (XALATAN) 0.005 % ophthalmic solution                                                  timolol hemihydrate (BETIMOL) 0.5 %  ophthalmic solution  Apply to eye                              travoprost (TRAVATAN Z) 0.004 % Ophth ophthalmic solution        Apply 1 drop to eye at bedtime                                 TRULICITY 1.5 WN/4.6 mL subcutaneous pen injector                                       No current facility-administered medications on file prior to visit.     Family History Family History Problem           Relation           Age of Onset            Skin cancer     Mother              Hyperlipidemia (Elevated cholesterol)            Mother              High blood pressure (Hypertension)   Mother              High blood pressure (Hypertension)   Father               Hyperlipidemia (Elevated cholesterol)            Father               Diabetes          Father               High blood pressure (Hypertension)   Brother                         Diabetes          Brother                    Social History   Tobacco Use Smoking Status           Never Smokeless Tobacco   Never     Social History Social History     Socioeconomic History  Marital status:  Married Tobacco Use            Smoking status:          Never            Smokeless tobacco:    Never Substance and Sexual Activity            Alcohol use:    Never            Drug use:        Never       Objective:     Vitals:              04/18/21 0933 BP:      136/80 Pulse:  97 Temp:  36.2 C (97.2 F) SpO2:  97% Weight:            98.4 kg (217 lb) Height: 165.1 cm (_0 )   Body mass index is 36.11 kg/m.   Physical Exam Constitutional:      Appearance: Normal appearance.  HENT:     Head: Normocephalic and atraumatic.     Mouth/Throat:     Mouth: Mucous membranes are moist.     Pharynx: Oropharynx is clear.  Eyes:     General: No scleral icterus.    Pupils: Pupils are equal, round, and reactive to light.  Cardiovascular:     Rate and Rhythm: Normal rate and regular rhythm.     Pulses: Normal pulses.      Heart sounds: No murmur heard.   No friction rub. No gallop.  Pulmonary:     Effort: Pulmonary effort is normal. No respiratory distress.     Breath sounds: Normal breath sounds. No stridor.  Abdominal:     General: Abdomen is flat.     Hernia: A hernia is present.      Musculoskeletal:        General: No swelling.  Skin:    General: Skin is warm.  Neurological:     General: No focal deficit present.     Mental Status: She is alert and oriented to person, place, and time. Mental status is at baseline.  Psychiatric:        Mood and Affect: Mood normal.        Thought Content: Thought content normal.        Judgment: Judgment normal.        Assessment and Plan: Diagnoses and all orders for this visit:   Incisional hernia without obstruction or gangrene     Sarah [name] is a 56 y.o. female  I long discussion in regards to the patient's hernia and the previous operation.  I did discuss with her that this would be a challenging case secondary to her previous operation and previous hernia repair. Patient is interested in in incisional hernia repair with mesh.  This would likely be a process via laparotomy, lysis of adhesions, and either possible retrorectus, external oblique dissection, or onlay repair.       1.         All risks and benefits were discussed with the patient, to generally include infection, bleeding, damage to surrounding structures, acute and chronic nerve pain, and recurrence. Alternatives were offered and described.  All questions were answered and the patient voiced understanding of the procedure and wishes to proceed at this point.             No follow-ups on file.   Ralene Ok, MD, [facility]  [place] [facility]im[facility]

## 2021-07-04 ENCOUNTER — Other Ambulatory Visit (HOSPITAL_COMMUNITY): Payer: Self-pay

## 2021-07-04 ENCOUNTER — Encounter (HOSPITAL_COMMUNITY): Payer: Self-pay | Admitting: General Surgery

## 2021-07-04 DIAGNOSIS — Z79899 Other long term (current) drug therapy: Secondary | ICD-10-CM | POA: Diagnosis not present

## 2021-07-04 DIAGNOSIS — I10 Essential (primary) hypertension: Secondary | ICD-10-CM | POA: Diagnosis not present

## 2021-07-04 DIAGNOSIS — Z7985 Long-term (current) use of injectable non-insulin antidiabetic drugs: Secondary | ICD-10-CM | POA: Diagnosis not present

## 2021-07-04 DIAGNOSIS — Z7984 Long term (current) use of oral hypoglycemic drugs: Secondary | ICD-10-CM | POA: Diagnosis not present

## 2021-07-04 DIAGNOSIS — Z8543 Personal history of malignant neoplasm of ovary: Secondary | ICD-10-CM | POA: Diagnosis not present

## 2021-07-04 DIAGNOSIS — K430 Incisional hernia with obstruction, without gangrene: Secondary | ICD-10-CM | POA: Diagnosis not present

## 2021-07-04 DIAGNOSIS — E119 Type 2 diabetes mellitus without complications: Secondary | ICD-10-CM | POA: Diagnosis not present

## 2021-07-04 LAB — GLUCOSE, CAPILLARY: Glucose-Capillary: 170 mg/dL — ABNORMAL HIGH (ref 70–99)

## 2021-07-04 LAB — BASIC METABOLIC PANEL
Anion gap: 7 (ref 5–15)
BUN: 13 mg/dL (ref 6–20)
CO2: 22 mmol/L (ref 22–32)
Calcium: 10.5 mg/dL — ABNORMAL HIGH (ref 8.9–10.3)
Chloride: 103 mmol/L (ref 98–111)
Creatinine, Ser: 1.23 mg/dL — ABNORMAL HIGH (ref 0.44–1.00)
GFR, Estimated: 52 mL/min — ABNORMAL LOW (ref >60)
Glucose, Bld: 120 mg/dL — ABNORMAL HIGH (ref 70–99)
Potassium: 4.4 mmol/L (ref 3.5–5.1)
Sodium: 132 mmol/L — ABNORMAL LOW (ref 135–145)

## 2021-07-04 LAB — SURGICAL PATHOLOGY

## 2021-07-04 MED ORDER — OXYCODONE-ACETAMINOPHEN 5-325 MG PO TABS
1.0000 | ORAL_TABLET | ORAL | 0 refills | Status: DC | PRN
  Filled 2021-07-04: qty 20, 4d supply, fill #0

## 2021-07-04 NOTE — Plan of Care (Signed)
°  Problem: Education: Goal: Knowledge of General Education information will improve Description: Including pain rating scale, medication(s)/side effects and non-pharmacologic comfort measures 07/04/2021 0054 by Kennith Center, RN Outcome: Progressing 07/04/2021 0054 by Kennith Center, RN Outcome: Progressing   Problem: Activity: Goal: Risk for activity intolerance will decrease 07/04/2021 0054 by Kennith Center, RN Outcome: Progressing 07/04/2021 0054 by Kennith Center, RN Outcome: Progressing   Problem: Nutrition: Goal: Adequate nutrition will be maintained Outcome: Progressing   Problem: Coping: Goal: Level of anxiety will decrease Outcome: Progressing   Problem: Elimination: Goal: Will not experience complications related to urinary retention Outcome: Progressing

## 2021-07-04 NOTE — Progress Notes (Signed)
Discharge instructions given to patient. Patient verbalizes understanding. Went over medication list and new medication. Patient verbalizes understanding. Iv removed, patient discharge to home

## 2021-07-04 NOTE — Discharge Summary (Signed)
Physician Discharge Summary  Patient ID: JAMAL HASKIN MRN: [id number] DOB/AGE: 56-07-1965 56 y.o.  Admit date: 07/03/2021 Discharge date: 07/04/2021  Admission Diagnoses:  Status post open hernia repair, lysis of adhesions  Discharge Diagnoses:  Principal Problem:   S/P hernia repair   Discharged Condition: good  Hospital Course: Patient is a 56 year old female who underwent open incisional hernia repair with mesh, and lyse of adhesions.  Please see operative note for full details.  Patient's had no complications postop.  She was advanced from a liquid diet to a regular diet.  She was otherwise ambulating well on her own.  She had no issues with pain control.  She was otherwise afebrile, deemed stable for discharge and discharged home.  Consults: None  Significant Diagnostic Studies: None  Treatments: surgery: As above  Discharge Exam: Blood pressure 108/68, pulse 83, temperature 98.7 F (37.1 C), temperature source Oral, resp. rate 18, height $RemoveBe'5\' 5"'arSAPiIiT$  (1.651 m), weight 93 kg, SpO2 95 %. General appearance: alert and cooperative GI: soft, non-tender; bowel sounds normal; no masses,  no organomegaly and incision clean dry and intact, JP serosanguineous.  Disposition: Discharge disposition: 01-Home or Self Care       Discharge Instructions     Diet - low sodium heart healthy   Complete by: As directed    Increase activity slowly   Complete by: As directed       Allergies as of 07/04/2021       Reactions   Emend [aprepitant] Other (See Comments)   Urticaria   Lisinopril Cough        Medication List     TAKE these medications    Accu-Chek Aviva Plus test strip Generic drug: glucose blood TEST BLOOD SUGAR AS DIRECTED   Accu-Chek Aviva Plus w/Device Kit USE AS DIRECTED   Accu-Chek Softclix Lancet Dev Kit Check blood sugars 3 times a day.   Accu-Chek Softclix Lancets lancets Check BS three times a day   albuterol 108 (90 Base) MCG/ACT  inhaler Commonly known as: VENTOLIN HFA Inhale 2 puffs into the lungs every 6 (six) hours as needed for wheezing or shortness of breath.   amLODipine 10 MG tablet Commonly known as: NORVASC Take 1 tablet (10 mg total) by mouth daily.   aspirin-sod bicarb-citric acid 325 MG Tbef tablet Commonly known as: ALKA-SELTZER Take 650 mg by mouth every 6 (six) hours as needed (indigestion).   buPROPion 150 MG 12 hr tablet Commonly known as: Wellbutrin SR Take 1 tablet (150 mg total) by mouth in the morning.   dorzolamide-timolol 22.3-6.8 MG/ML ophthalmic solution Commonly known as: COSOPT Place 1 drop into both eyes 2 (two) times daily.   Droplet Pen Needles 29G X 12MM Misc Generic drug: Insulin Pen Needle USE AS DIRECTED   gabapentin 600 MG tablet Commonly known as: Neurontin Take 1 tablet (600 mg total) by mouth 3 (three) times daily.   hydrALAZINE 25 MG tablet Commonly known as: APRESOLINE TAKE 1 AND 1/2 TABLETS THREE TIMES DAILY   Lantus SoloStar 100 UNIT/ML Solostar Pen Generic drug: insulin glargine INJECT 30 UNITS DAILY. INCREASE BY 2 UNITS EVERY 3 DAYS UNTIL MORNING SUGAR IS LESS THAN 130. MAX OF 40 UNITS. What changed: See the new instructions.   latanoprost 0.005 % ophthalmic solution Commonly known as: XALATAN Place 1 drop into both eyes at bedtime.   levocetirizine 5 MG tablet Commonly known as: Xyzal Take 1 tablet (5 mg total) by mouth every evening.   losartan 50 MG tablet Commonly  known as: COZAAR TAKE 1 TABLET EVERY DAY   metFORMIN 1000 MG tablet Commonly known as: GLUCOPHAGE TAKE 1 TABLET TWICE DAILY WITH MEALS   metoprolol tartrate 25 MG tablet Commonly known as: LOPRESSOR TAKE 1/2 TABLET TWICE DAILY   montelukast 10 MG tablet Commonly known as: SINGULAIR Take 1 tablet (10 mg total) by mouth at bedtime.   Mounjaro 10 MG/0.5ML Pen Generic drug: tirzepatide Inject 10 mg (1 pen) into the skin once a week.   ondansetron 4 MG tablet Commonly known  as: Zofran Take 1 tablet by mouth every 6 hours as needed   oxyCODONE-acetaminophen 5-325 MG tablet Commonly known as: Percocet Take 1 tablet by mouth every 4 (four) hours as needed for severe pain.   rosuvastatin 20 MG tablet Commonly known as: CRESTOR TAKE 1 TABLET EVERY DAY   vitamin B-12 500 MCG tablet Commonly known as: CYANOCOBALAMIN Take 1 tablet (500 mcg total) by mouth daily.   Vitamin D (Ergocalciferol) 1.25 MG (50000 UNIT) Caps capsule Commonly known as: DRISDOL 1 po q 10 days        Follow-up Information     Ralene Ok, MD. Schedule an appointment as soon as possible for a visit in 2 week(s).   Specialty: General Surgery Why: Post op visit Contact information: [address] [place] [address] [phone]                 Signed: Ralene Ok 07/04/2021, 9:06 AM

## 2021-07-05 ENCOUNTER — Telehealth: Payer: Self-pay

## 2021-07-05 DIAGNOSIS — K430 Incisional hernia with obstruction, without gangrene: Secondary | ICD-10-CM | POA: Diagnosis not present

## 2021-07-05 NOTE — Telephone Encounter (Signed)
Transition Care Management Follow-up Telephone Call Date of discharge and from where: 07/04/2021, [facility] How have you been since you were released from the hospital? She said she is sore, but doing okay, having difficulty getting out of bed. Any questions or concerns? No  Items Reviewed: Did the pt receive and understand the discharge instructions provided? Yes  Medications obtained and verified? Yes  - she said she has all medications and did not have any questions about her med regime. She has a glucometer.  Other? No  Any new allergies since your discharge? No  Dietary orders reviewed? Yes Do you have support at home? Yes  - her sons  [place] and Equipment/Supplies: Were home health services ordered? no If so, what is the name of the agency? N/a  Has the agency set up a time to come to the patient's home? not applicable Were any new equipment or medical supplies ordered?  No What is the name of the medical supply agency? N/a Were you able to get the supplies/equipment? not applicable Do you have any questions related to the use of the equipment or supplies? No  She explained that she has been emptying the JP drain and recording amounts  - 1 ox yesterday and 1.5 oz so far today. She has an appointment  at the surgeon's office to have it removed tomorrow.    Functional Questionnaire: (I = Independent and D = Dependent) ADLs: independent  Follow up appointments reviewed:  PCP Hospital f/u appt confirmed?  Scheduled to see Dr Wynetta Emery 10/04/2021    Specialist Hospital f/u appt confirmed? Yes  Scheduled to have JP drain removed - 07/06/2021, staples to be removed - 07/13/2021;  scheduled to see the surgeon - 07/24/2021.  Are transportation arrangements needed? No  If their condition worsens, is the pt aware to call PCP or go to the Emergency Dept.? Yes Was the patient provided with contact information for the PCP's office or ED? Yes Was to pt encouraged to call back with  questions or concerns? Yes

## 2021-07-06 ENCOUNTER — Encounter (INDEPENDENT_AMBULATORY_CARE_PROVIDER_SITE_OTHER): Payer: Self-pay

## 2021-07-10 ENCOUNTER — Telehealth (INDEPENDENT_AMBULATORY_CARE_PROVIDER_SITE_OTHER): Payer: Medicare HMO | Admitting: Family Medicine

## 2021-07-10 ENCOUNTER — Encounter (INDEPENDENT_AMBULATORY_CARE_PROVIDER_SITE_OTHER): Payer: Self-pay | Admitting: Family Medicine

## 2021-07-10 ENCOUNTER — Other Ambulatory Visit: Payer: Self-pay

## 2021-07-10 ENCOUNTER — Other Ambulatory Visit (HOSPITAL_COMMUNITY): Payer: Self-pay

## 2021-07-10 DIAGNOSIS — F39 Unspecified mood [affective] disorder: Secondary | ICD-10-CM | POA: Diagnosis not present

## 2021-07-10 DIAGNOSIS — Z794 Long term (current) use of insulin: Secondary | ICD-10-CM | POA: Diagnosis not present

## 2021-07-10 DIAGNOSIS — E538 Deficiency of other specified B group vitamins: Secondary | ICD-10-CM

## 2021-07-10 DIAGNOSIS — Z6833 Body mass index (BMI) 33.0-33.9, adult: Secondary | ICD-10-CM

## 2021-07-10 DIAGNOSIS — E1169 Type 2 diabetes mellitus with other specified complication: Secondary | ICD-10-CM | POA: Diagnosis not present

## 2021-07-10 DIAGNOSIS — Z9189 Other specified personal risk factors, not elsewhere classified: Secondary | ICD-10-CM

## 2021-07-10 DIAGNOSIS — E559 Vitamin D deficiency, unspecified: Secondary | ICD-10-CM | POA: Diagnosis not present

## 2021-07-10 DIAGNOSIS — Z6837 Body mass index (BMI) 37.0-37.9, adult: Secondary | ICD-10-CM

## 2021-07-10 DIAGNOSIS — J3089 Other allergic rhinitis: Secondary | ICD-10-CM

## 2021-07-10 DIAGNOSIS — E669 Obesity, unspecified: Secondary | ICD-10-CM

## 2021-07-10 MED ORDER — BUPROPION HCL ER (SR) 150 MG PO TB12
150.0000 mg | ORAL_TABLET | Freq: Every morning | ORAL | 0 refills | Status: DC
  Filled 2021-07-10: qty 30, 30d supply, fill #0

## 2021-07-11 NOTE — Progress Notes (Signed)
TeleHealth Visit:  Due to the COVID-19 pandemic, this visit was completed with telemedicine (audio/video) technology to reduce patient and provider exposure as well as to preserve personal protective equipment.   Sarah [name] has verbally consented to this TeleHealth visit. The patient is located at home, the provider is located at the [facility] office. The participants in this visit include the listed provider and patient. The visit was conducted today via MyChart video.   Chief Complaint: OBESITY Sarah [name] is here to discuss her progress with her obesity treatment plan along with follow-up of her obesity related diagnoses. Sarah [name] is on the Category 2 Plan and states she is following her eating plan approximately 99% of the time. Sarah [name] states she is doing 0 minutes 0 times per week.  Today's visit was #: 109 Starting weight: 228 lbs Starting date: 02/15/2020  Interim History: Sarah [name] had hernia surgery last week. She is not on pain medications at all. She is eating 3 meals per day, but not eating much, maybe about 1/2 as much as usual. It is easy to resist cookies and crackers on Mounjaro, which she is still taking even with having surgery.  - No concerns today   Subjective:   1. Type 2 diabetes mellitus with other specified complication, with long-term current use of insulin (HCC) Sarah [name]'s cravings for junk have been non-existent on Mounjaro. Last A1c was 6.2, and is doing great and has improved from prior. She denies lows or concerns. She has an Musician as well. She is taking Lantus, metformin, and Mounjaro.   I discussed labs with the patient today.    2. Vitamin D deficiency Sarah [name] is asymptomatic, she denies any S-E of the medicine and states her energy levels have never been better. Her Vit D level is at 99.   I discussed labs with the patient today.    3. B12 deficiency Sarah [name]'s energy level has improved a lot. Her B12 level is high-normal. She is taking  B12 OTC 500 mcg daily and she feels great.   I discussed labs with the patient today.    4. Mood disorder ([place]) with emotional eating Sarah [name] has no concerns today, and she is tolerating her medications well. She denies emotional eating despite increased stress of symptoms recently.     5. At risk for hypoglycemia Sarah [name] is at increased risk for hypoglycemia due to changes in diet, diagnosis of diabetes, and/or insulin use.   .labs  Assessment/Plan:  No orders of the defined types were placed in this encounter.   Medications Discontinued During This Encounter  Medication Reason   buPROPion (WELLBUTRIN SR) 150 MG 12 hr tablet Reorder   D/C Vitamin D, Ergocalciferol, (DRISDOL) 1.25 MG (50000 UNIT) CAPS capsule      Meds ordered this encounter  Medications   buPROPion (WELLBUTRIN SR) 150 MG 12 hr tablet    Sig: Take 1 tablet  by mouth in the morning.    Dispense:  30 tablet    Refill:  0     1. Type 2 diabetes mellitus with other specified complication, with long-term current use of insulin (HCC) A1c has continued to improve and is at goal at 6.2; was 9.0 when started our program. - Sarah [name] will continue to ween insulin under the advice of her Endocrinologist.  - She will continue her medications and prudent nutritional plan.  - she denies need for RF at this time.   2. Vitamin D deficiency Labs- above goal as she continues to  lose wt.  Sarah [name] agreed to discontinue Ergocalciferol.  - We will recheck labs in 3 months after being off Vitamin D supplements. She will continue her prudent nutritional plan and weight loss.    3. B12 deficiency B12 at goal Sarah [name] will continues her B12 supplementation as she is taking it; especially since high risk neuropathy and helping with energy.   4. Mood disorder ([place]) with emotional eating Mood stable.  Sarah [name] will continue her medications. We will refill Wellbutrin SR * 1 month, at the same dose.  - buPROPion (WELLBUTRIN SR) 150  MG 12 hr tablet; Take 1 tablet  by mouth in the morning.  Dispense: 30 tablet; Refill: 0  5. At risk for hypoglycemia Sarah [name] was given approximately >8 minutes of counseling today regarding prevention of hypoglycemia. She was advised of symptoms of hypoglycemia. Sarah [name] was instructed to avoid skipping meals, eat regular protein rich meals, and schedule low calorie snacks as needed.  Repetitive spaced learning was employed today to elicit superior memory formation and behavioral change.   6. Obesity with current BMI of 33.78 Sarah [name] is currently in the action stage of change. As such, her goal is to continue with weight loss efforts. She has agreed to the Category 2 Plan.   Sarah [name] is to use protein equivalents to get all of her protein in during the day if needed.  - Intentional eating was discussed.  Exercise goals: As is.  Behavioral modification strategies: increasing lean protein intake, no skipping meals, and planning for success.  Sarah [name] has agreed to follow-up with our clinic in 2 weeks with Abby Potash, PA-C, and in 4 weeks with myself. She was informed of the importance of frequent follow-up visits to maximize her success with intensive lifestyle modifications for her multiple health conditions.    Objective:   VITALS: Per patient if applicable, see vitals. GENERAL: Alert and in no acute distress. CARDIOPULMONARY: No increased WOB. Speaking in clear sentences.  PSYCH: Pleasant and cooperative. Speech normal rate and rhythm. Affect is appropriate. Insight and judgement are appropriate. Attention is focused, linear, and appropriate.  NEURO: Oriented as arrived to appointment on time with no prompting.   Lab Results  Component Value Date   CREATININE 1.23 (H) 07/04/2021   BUN 13 07/04/2021   NA 132 (L) 07/04/2021   K 4.4 07/04/2021   CL 103 07/04/2021   CO2 22 07/04/2021   Lab Results  Component Value Date   ALT 23 03/07/2021   AST 24 03/07/2021   ALKPHOS 68  03/07/2021   BILITOT 0.6 03/07/2021   Lab Results  Component Value Date   HGBA1C 6.2 (H) 06/26/2021   HGBA1C 7.0 (H) 03/07/2021   HGBA1C 7.2 (H) 11/29/2020   HGBA1C 5.6 06/14/2020   HGBA1C 6.9 (H) 04/17/2020   No results found for: INSULIN Lab Results  Component Value Date   TSH 0.938 02/15/2020   Lab Results  Component Value Date   CHOL 145 11/29/2020   HDL 44 11/29/2020   LDLCALC 60 11/29/2020   TRIG 261 (H) 11/29/2020   CHOLHDL 3.3 11/29/2020   Lab Results  Component Value Date   VD25OH 99.6 06/26/2021   VD25OH 81.1 03/07/2021   VD25OH 60.6 11/29/2020   Lab Results  Component Value Date   WBC 5.2 06/26/2021   HGB 13.2 06/26/2021   HCT 41.2 06/26/2021   MCV 92.8 06/26/2021   PLT 309 06/26/2021   Lab Results  Component Value Date   IRON 43 08/18/2010   TIBC  389 08/18/2010   FERRITIN 227 08/18/2010    Current Outpatient Medications:    Accu-Chek Softclix Lancets lancets, Check BS three times a day, Disp: 300 each, Rfl: 12   albuterol (VENTOLIN HFA) 108 (90 Base) MCG/ACT inhaler, Inhale 2 puffs into the lungs every 6 (six) hours as needed for wheezing or shortness of breath., Disp: 8 g, Rfl: 0   amLODipine (NORVASC) 10 MG tablet, Take 1 tablet (10 mg total) by mouth daily., Disp: 90 tablet, Rfl: 3   aspirin-sod bicarb-citric acid (ALKA-SELTZER) 325 MG TBEF tablet, Take 650 mg by mouth every 6 (six) hours as needed (indigestion)., Disp: , Rfl:    Blood Glucose Monitoring Suppl (ACCU-CHEK AVIVA PLUS) w/Device KIT, USE AS DIRECTED, Disp: 1 kit, Rfl: 0   dorzolamide-timolol (COSOPT) 22.3-6.8 MG/ML ophthalmic solution, Place 1 drop into both eyes 2 (two) times daily., Disp: , Rfl:    DROPLET PEN NEEDLES 29G X 12MM MISC, USE AS DIRECTED, Disp: 100 each, Rfl: 0   gabapentin (NEURONTIN) 600 MG tablet, Take 1 tablet (600 mg total) by mouth 3 (three) times daily., Disp: 270 tablet, Rfl: 3   glucose blood (ACCU-CHEK AVIVA PLUS) test strip, TEST BLOOD SUGAR AS DIRECTED, Disp:  300 strip, Rfl: 12   hydrALAZINE (APRESOLINE) 25 MG tablet, TAKE 1 AND 1/2 TABLETS THREE TIMES DAILY, Disp: 405 tablet, Rfl: 3   Lancets Misc. (ACCU-CHEK SOFTCLIX LANCET DEV) KIT, Check blood sugars 3 times a day., Disp: 3 kit, Rfl: 6   LANTUS SOLOSTAR 100 UNIT/ML Solostar Pen, INJECT 30 UNITS DAILY. INCREASE BY 2 UNITS EVERY 3 DAYS UNTIL MORNING SUGAR IS LESS THAN 130. MAX OF 40 UNITS. (Patient taking differently: Inject 20 Units into the skin at bedtime.), Disp: 45 mL, Rfl: 1   latanoprost (XALATAN) 0.005 % ophthalmic solution, Place 1 drop into both eyes at bedtime., Disp: , Rfl:    levocetirizine (XYZAL) 5 MG tablet, Take 1 tablet (5 mg total) by mouth every evening., Disp: 30 tablet, Rfl: 0   losartan (COZAAR) 50 MG tablet, TAKE 1 TABLET EVERY DAY, Disp: 90 tablet, Rfl: 0   metFORMIN (GLUCOPHAGE) 1000 MG tablet, TAKE 1 TABLET TWICE DAILY WITH MEALS, Disp: 180 tablet, Rfl: 1   metoprolol tartrate (LOPRESSOR) 25 MG tablet, TAKE 1/2 TABLET TWICE DAILY, Disp: 90 tablet, Rfl: 0   montelukast (SINGULAIR) 10 MG tablet, Take 1 tablet (10 mg total) by mouth at bedtime., Disp: 30 tablet, Rfl: 0   ondansetron (ZOFRAN) 4 MG tablet, Take 1 tablet by mouth every 6 hours as needed, Disp: 20 tablet, Rfl: 0   oxyCODONE-acetaminophen (PERCOCET) 5-325 MG tablet, Take 1 tablet by mouth every 4 (four) hours as needed for severe pain., Disp: 20 tablet, Rfl: 0   rosuvastatin (CRESTOR) 20 MG tablet, TAKE 1 TABLET EVERY DAY, Disp: 90 tablet, Rfl: 0   tirzepatide (MOUNJARO) 10 MG/0.5ML Pen, Inject 10 mg (1 pen) into the skin once a week., Disp: 6 mL, Rfl: 0   vitamin B-12 (CYANOCOBALAMIN) 500 MCG tablet, Take 1 tablet (500 mcg total) by mouth daily., Disp: , Rfl:    buPROPion (WELLBUTRIN SR) 150 MG 12 hr tablet, Take 1 tablet  by mouth in the morning., Disp: 30 tablet, Rfl: 0  Attestation Statements:   Reviewed by clinician on day of visit: allergies, medications, problem list, medical history, surgical history,  family history, social history, and previous encounter notes.   Wilhemena Durie, am acting as transcriptionist for [employer], DO.  I have reviewed the above documentation  for accuracy and completeness, and I agree with the above. Marjory Sneddon, D.O.  The [place] was signed into law in 2016 which includes the topic of electronic health records.  This provides immediate access to information in MyChart.  This includes consultation notes, operative notes, office notes, lab results and pathology reports.  If you have any questions about what you read please let us know at your next visit so we can discuss your concerns and take corrective action if need be.  We are right here with you.

## 2021-07-25 ENCOUNTER — Ambulatory Visit (INDEPENDENT_AMBULATORY_CARE_PROVIDER_SITE_OTHER): Payer: Medicare HMO | Admitting: Physician Assistant

## 2021-07-25 ENCOUNTER — Encounter (INDEPENDENT_AMBULATORY_CARE_PROVIDER_SITE_OTHER): Payer: Self-pay | Admitting: Physician Assistant

## 2021-07-25 ENCOUNTER — Other Ambulatory Visit: Payer: Self-pay

## 2021-07-25 VITALS — BP 130/82 | HR 88 | Temp 98.2°F | Ht 65.0 in | Wt 201.0 lb

## 2021-07-25 DIAGNOSIS — E669 Obesity, unspecified: Secondary | ICD-10-CM | POA: Diagnosis not present

## 2021-07-25 DIAGNOSIS — F39 Unspecified mood [affective] disorder: Secondary | ICD-10-CM

## 2021-07-25 DIAGNOSIS — E66812 Obesity, class 2: Secondary | ICD-10-CM

## 2021-07-25 DIAGNOSIS — E1169 Type 2 diabetes mellitus with other specified complication: Secondary | ICD-10-CM | POA: Diagnosis not present

## 2021-07-25 DIAGNOSIS — Z7985 Long-term (current) use of injectable non-insulin antidiabetic drugs: Secondary | ICD-10-CM | POA: Diagnosis not present

## 2021-07-25 DIAGNOSIS — Z8719 Personal history of other diseases of the digestive system: Secondary | ICD-10-CM | POA: Diagnosis not present

## 2021-07-25 DIAGNOSIS — Z6833 Body mass index (BMI) 33.0-33.9, adult: Secondary | ICD-10-CM

## 2021-07-25 DIAGNOSIS — Z9889 Other specified postprocedural states: Secondary | ICD-10-CM | POA: Diagnosis not present

## 2021-07-25 MED ORDER — TIRZEPATIDE 10 MG/0.5ML ~~LOC~~ SOAJ: 10.0000 mg | SUBCUTANEOUS | 0 refills | Status: DC

## 2021-07-25 NOTE — Progress Notes (Signed)
Chief Complaint:   OBESITY Symphany is here to discuss her progress with her obesity treatment plan along with follow-up of her obesity related diagnoses. Sarah [name] is on the Category 2 Plan and states she is following her eating plan approximately 90% of the time. Sarah [name] states she is not currently exercising.  Today's visit was #: 54 Starting weight: 228 lbs Starting date: 02/15/2020 Today's weight: 201 lbs Today's date: 07/25/2021 Total lbs lost to date: 27 Total lbs lost since last in-office visit: 2  Interim History: Sarah [name] had hernia surgery 3 weeks ago. She was able to get back on pan 2 days ago. Right after surgery, she tried to eat some chicken and fish and has been working on getting in as much protein as she can.  Subjective:   1. Type 2 diabetes mellitus with other specified complication, with long-term current use of insulin (HCC) Pt is on Mounjaro 10 mg weekly. She is starting to get her appetite back after having surgery.  2. Mood disorder ([place]) with emotional eating Pt reports that Wellbutrin is still helping with cravings. Mood stable.  Assessment/Plan:   1. Type 2 diabetes mellitus with other specified complication, with long-term current use of insulin (HCC) Good blood sugar control is important to decrease the likelihood of diabetic complications such as nephropathy, neuropathy, limb loss, blindness, coronary artery disease, and death. Intensive lifestyle modification including diet, exercise and weight loss are the first line of treatment for diabetes.   Refill- tirzepatide (MOUNJARO) 10 MG/0.5ML Pen; Inject 10 mg (1 pen) into the skin once a week.  Dispense: 6 mL; Refill: 0  2. Mood disorder ([place]) with emotional eating Behavior modification techniques were discussed today to help Sarah [name] deal with her emotional/non-hunger eating behaviors.  Orders and follow up as documented in patient record. Continue current treatment plan.  3. Obesity with current BMI of  33.78 Sarah [name] is currently in the action stage of change. As such, her goal is to continue with weight loss efforts. She has [name] to the Category 2 Plan.   Exercise goals: All adults should avoid inactivity. Some physical activity is better than none, and adults who participate in any amount of physical activity gain some health benefits.  Behavioral modification strategies: increasing lean protein intake and no skipping meals.  Sarah [name] has [name] to follow-up with our clinic in 2 weeks. She was informed of the importance of frequent follow-up visits to maximize her success with intensive lifestyle modifications for her multiple health conditions.   Objective:   Blood pressure 130/82, pulse 88, temperature 98.2 F (36.8 C), height 5\' 5"  (1.651 m), weight 201 lb (91.2 kg), SpO2 96 %. Body mass index is 33.45 kg/m.  General: Cooperative, alert, well developed, in no acute distress. HEENT: Conjunctivae and lids unremarkable. Cardiovascular: Regular rhythm.  Lungs: Normal work of breathing. Neurologic: No focal deficits.   Lab Results  Component Value Date   CREATININE 1.23 (H) 07/04/2021   BUN 13 07/04/2021   NA 132 (L) 07/04/2021   K 4.4 07/04/2021   CL 103 07/04/2021   CO2 22 07/04/2021   Lab Results  Component Value Date   ALT 23 03/07/2021   AST 24 03/07/2021   ALKPHOS 68 03/07/2021   BILITOT 0.6 03/07/2021   Lab Results  Component Value Date   HGBA1C 6.2 (H) 06/26/2021   HGBA1C 7.0 (H) 03/07/2021   HGBA1C 7.2 (H) 11/29/2020   HGBA1C 5.6 06/14/2020   HGBA1C 6.9 (H) 04/17/2020   No results  found for: INSULIN Lab Results  Component Value Date   TSH 0.938 02/15/2020   Lab Results  Component Value Date   CHOL 145 11/29/2020   HDL 44 11/29/2020   LDLCALC 60 11/29/2020   TRIG 261 (H) 11/29/2020   CHOLHDL 3.3 11/29/2020   Lab Results  Component Value Date   VD25OH 99.6 06/26/2021   VD25OH 81.1 03/07/2021   VD25OH 60.6 11/29/2020   Lab Results  Component  Value Date   WBC 5.2 06/26/2021   HGB 13.2 06/26/2021   HCT 41.2 06/26/2021   MCV 92.8 06/26/2021   PLT 309 06/26/2021   Lab Results  Component Value Date   IRON 43 08/18/2010   TIBC 389 08/18/2010   FERRITIN 227 08/18/2010    Obesity Behavioral Intervention:   Approximately 15 minutes were spent on the discussion below.  ASK: We discussed the diagnosis of obesity with Sarah [name] today and Sarah [name] [name] to give [place] permission to discuss obesity behavioral modification therapy today.  ASSESS: Sarah [name] has the diagnosis of obesity and her BMI today is 33.4. Sarah [name] is in the action stage of change.   ADVISE: Sarah [name] was educated on the multiple health risks of obesity as well as the benefit of weight loss to improve her health. She was advised of the need for long term treatment and the importance of lifestyle modifications to improve her current health and to decrease her risk of future health problems.  AGREE: Multiple dietary modification options and treatment options were discussed and Sarah [name] [name] to follow the recommendations documented in the above note.  ARRANGE: Sarah [name] was educated on the importance of frequent visits to treat obesity as outlined per CMS and USPSTF guidelines and [name] to schedule her next follow up appointment today.  Attestation Statements:   Reviewed by clinician on day of visit: allergies, medications, problem list, medical history, surgical history, family history, social history, and previous encounter notes.  Coral Ceo, CMA, am acting as transcriptionist for [employer], PA-C.  I have reviewed the above documentation for accuracy and completeness, and I agree with the above. Abby Potash, PA-C

## 2021-07-27 ENCOUNTER — Encounter (INDEPENDENT_AMBULATORY_CARE_PROVIDER_SITE_OTHER): Payer: Self-pay | Admitting: Physician Assistant

## 2021-07-31 ENCOUNTER — Other Ambulatory Visit (INDEPENDENT_AMBULATORY_CARE_PROVIDER_SITE_OTHER): Payer: Self-pay

## 2021-07-31 ENCOUNTER — Other Ambulatory Visit (HOSPITAL_COMMUNITY): Payer: Self-pay

## 2021-07-31 DIAGNOSIS — Z794 Long term (current) use of insulin: Secondary | ICD-10-CM

## 2021-07-31 DIAGNOSIS — E1169 Type 2 diabetes mellitus with other specified complication: Secondary | ICD-10-CM

## 2021-07-31 MED ORDER — TIRZEPATIDE 10 MG/0.5ML ~~LOC~~ SOAJ
10.0000 mg | SUBCUTANEOUS | 0 refills | Status: DC
  Filled 2021-07-31: qty 6, 84d supply, fill #0

## 2021-07-31 NOTE — Telephone Encounter (Signed)
Sarah [name] 

## 2021-08-07 ENCOUNTER — Ambulatory Visit (INDEPENDENT_AMBULATORY_CARE_PROVIDER_SITE_OTHER): Payer: Medicare HMO | Admitting: Family Medicine

## 2021-08-07 ENCOUNTER — Other Ambulatory Visit: Payer: Self-pay

## 2021-08-07 ENCOUNTER — Encounter: Payer: Self-pay | Admitting: Internal Medicine

## 2021-08-07 ENCOUNTER — Encounter (INDEPENDENT_AMBULATORY_CARE_PROVIDER_SITE_OTHER): Payer: Self-pay | Admitting: Family Medicine

## 2021-08-07 VITALS — BP 125/80 | HR 75 | Temp 97.8°F | Ht 65.0 in | Wt 198.0 lb

## 2021-08-07 DIAGNOSIS — F39 Unspecified mood [affective] disorder: Secondary | ICD-10-CM | POA: Diagnosis not present

## 2021-08-07 DIAGNOSIS — Z6833 Body mass index (BMI) 33.0-33.9, adult: Secondary | ICD-10-CM

## 2021-08-07 DIAGNOSIS — I10 Essential (primary) hypertension: Secondary | ICD-10-CM

## 2021-08-07 DIAGNOSIS — E538 Deficiency of other specified B group vitamins: Secondary | ICD-10-CM

## 2021-08-07 DIAGNOSIS — Z794 Long term (current) use of insulin: Secondary | ICD-10-CM

## 2021-08-07 DIAGNOSIS — J3089 Other allergic rhinitis: Secondary | ICD-10-CM | POA: Diagnosis not present

## 2021-08-07 DIAGNOSIS — E1169 Type 2 diabetes mellitus with other specified complication: Secondary | ICD-10-CM

## 2021-08-07 DIAGNOSIS — E669 Obesity, unspecified: Secondary | ICD-10-CM

## 2021-08-07 DIAGNOSIS — Z7985 Long-term (current) use of injectable non-insulin antidiabetic drugs: Secondary | ICD-10-CM

## 2021-08-07 MED ORDER — MONTELUKAST SODIUM 10 MG PO TABS: 10.0000 mg | ORAL_TABLET | Freq: Every day | ORAL | 0 refills | Status: DC

## 2021-08-07 MED ORDER — LEVOCETIRIZINE DIHYDROCHLORIDE 5 MG PO TABS: 5.0000 mg | ORAL_TABLET | Freq: Every evening | ORAL | 0 refills | Status: DC

## 2021-08-07 MED ORDER — AMLODIPINE BESYLATE 10 MG PO TABS: 10.0000 mg | ORAL_TABLET | Freq: Every day | ORAL | 3 refills | Status: DC

## 2021-08-07 MED ORDER — BUPROPION HCL ER (SR) 150 MG PO TB12: 150.0000 mg | ORAL_TABLET | Freq: Every morning | ORAL | 0 refills | Status: DC

## 2021-08-08 NOTE — Progress Notes (Signed)
Chief Complaint:   OBESITY Sarah [name] is here to discuss her progress with her obesity treatment plan along with follow-up of her obesity related diagnoses. Sarah [name] is on the Category 2 Plan and states she is following her eating plan approximately 75% of the time. Sarah [name] states she is doing 0 minutes 0 times per week.  Today's visit was #: 14 Starting weight: 228 lbs Starting date: 02/15/2020 Today's weight: 198 lbs Today's date: 08/07/2021 Total lbs lost to date: 30 Total lbs lost since last in-office visit: 3  Interim History: Sarah [name] is eating all healthy meals/ foods, but not eating all of it, about 50% of the time.  No hunger or cravings. Dinner is often just veggies because she is not hungry.  SKipping proteins   Subjective:   1. Type 2 diabetes mellitus with other specified complication, with long-term current use of insulin (HCC) Staley's fasting blood sugars range between 90-110. No lows or concerns. Mounjaro 10 mg, and tolerating well. No issues or hunger/cravings. Even though she is not hungry, she declines to lower the dose of her GLP1  2. Environmental and seasonal allergies Vanette's symptoms are controlled. No concerns. Varsha is tolerating medication(s) well without side effects.  Medication compliance is good and patient appears to be taking it as prescribed.  The patient denies additional concerns regarding this condition.   3. Mood disorder ([place]) with emotional eating Amzie denies emotional eating. Mood is good/stable. No concerns. No need for medication dose adjustment.     Assessment/Plan:   Medications Discontinued During This Encounter  Medication Reason   levocetirizine (XYZAL) 5 MG tablet Reorder   montelukast (SINGULAIR) 10 MG tablet Reorder   buPROPion (WELLBUTRIN SR) 150 MG 12 hr tablet Reorder     Meds ordered this encounter  Medications   buPROPion (WELLBUTRIN SR) 150 MG 12 hr tablet    Sig: Take 1 tablet  by mouth in the morning.    Dispense:   30 tablet    Refill:  0   levocetirizine (XYZAL) 5 MG tablet    Sig: Take 1 tablet (5 mg total) by mouth every evening.    Dispense:  30 tablet    Refill:  0   montelukast (SINGULAIR) 10 MG tablet    Sig: Take 1 tablet (10 mg total) by mouth at bedtime.    Dispense:  30 tablet    Refill:  0     1. Type 2 diabetes mellitus with other specified complication, with long-term current use of insulin (HCC) Sarah [name] will continue Mounjaro, prudent nutritional plan, home blood glucose monitoring, and increase exercise.  2. Environmental and seasonal allergies We will refill Singulair and Xyzal for 1 month. Preventive strategies were discussed such as, washing face and netti potting post exposure    - levocetirizine (XYZAL) 5 MG tablet; Take 1 tablet (5 mg total) by mouth every evening.  Dispense: 30 tablet; Refill: 0 - montelukast (SINGULAIR) 10 MG tablet; Take 1 tablet (10 mg total) by mouth at bedtime.  Dispense: 30 tablet; Refill: 0  3. Mood disorder ([place]) with emotional eating Behavior modification techniques were discussed today to help Sarah [name] deal with her emotional state/ deal with stress.  We will refill Wellbutrin SR for 1 month. Orders and follow up as documented in patient record.   - buPROPion (WELLBUTRIN SR) 150 MG 12 hr tablet; Take 1 tablet  by mouth in the morning.  Dispense: 30 tablet; Refill: 0  4. Obesity with current BMI of 33 Sarah [name]  is currently in the action stage of change. As such, her goal is to continue with weight loss efforts. She has agreed to the Category 2 Plan and keeping a food journal and adhering to recommended goals of 1200-1350 calories and 100+ grams of protein daily.   Exercise goals: All adults should avoid inactivity. Some physical activity is better than none, and adults who participate in any amount of physical activity gain some health benefits. Start to walk some when released.  Behavioral modification strategies: meal planning and cooking strategies  and planning for success.  [name] has agreed to follow-up with our clinic in 3 weeks. She was informed of the importance of frequent follow-up visits to maximize her success with intensive lifestyle modifications for her multiple health conditions.   Objective:   Blood pressure 125/80, pulse 75, temperature 97.8 F (36.6 C), height 5\' 5"  (1.651 m), weight 198 lb (89.8 kg), SpO2 98 %. Body mass index is 32.95 kg/m.  General: Cooperative, alert, well developed, in no acute distress. HEENT: Conjunctivae and lids unremarkable. Cardiovascular: Regular rhythm.  Lungs: Normal work of breathing. Neurologic: No focal deficits.   Lab Results  Component Value Date   CREATININE 1.23 (H) 07/04/2021   BUN 13 07/04/2021   NA 132 (L) 07/04/2021   K 4.4 07/04/2021   CL 103 07/04/2021   CO2 22 07/04/2021   Lab Results  Component Value Date   ALT 23 03/07/2021   AST 24 03/07/2021   ALKPHOS 68 03/07/2021   BILITOT 0.6 03/07/2021   Lab Results  Component Value Date   HGBA1C 6.2 (H) 06/26/2021   HGBA1C 7.0 (H) 03/07/2021   HGBA1C 7.2 (H) 11/29/2020   HGBA1C 5.6 06/14/2020   HGBA1C 6.9 (H) 04/17/2020   No results found for: INSULIN Lab Results  Component Value Date   TSH 0.938 02/15/2020   Lab Results  Component Value Date   CHOL 145 11/29/2020   HDL 44 11/29/2020   LDLCALC 60 11/29/2020   TRIG 261 (H) 11/29/2020   CHOLHDL 3.3 11/29/2020   Lab Results  Component Value Date   VD25OH 99.6 06/26/2021   VD25OH 81.1 03/07/2021   VD25OH 60.6 11/29/2020   Lab Results  Component Value Date   WBC 5.2 06/26/2021   HGB 13.2 06/26/2021   HCT 41.2 06/26/2021   MCV 92.8 06/26/2021   PLT 309 06/26/2021   Lab Results  Component Value Date   IRON 43 08/18/2010   TIBC 389 08/18/2010   FERRITIN 227 08/18/2010    Obesity Behavioral Intervention:   Approximately 15 minutes were spent on the discussion below.  ASK: We discussed the diagnosis of obesity with Sarah [name] today and Sarah [name]  agreed to give [place] permission to discuss obesity behavioral modification therapy today.  ASSESS: Sarah [name] has the diagnosis of obesity and her BMI today is 33.0. Sarah [name] is in the action stage of change.   ADVISE: Sarah [name] was educated on the multiple health risks of obesity as well as the benefit of weight loss to improve her health. She was advised of the need for long term treatment and the importance of lifestyle modifications to improve her current health and to decrease her risk of future health problems.  AGREE: Multiple dietary modification options and treatment options were discussed and Midge agreed to follow the recommendations documented in the above note.  ARRANGE: Sameena was educated on the importance of frequent visits to treat obesity as outlined per CMS and USPSTF guidelines and agreed to schedule her next follow  up appointment today.  Attestation Statements:   Reviewed by clinician on day of visit: allergies, medications, problem list, medical history, surgical history, family history, social history, and previous encounter notes.   Wilhemena Durie, am acting as transcriptionist for [employer], DO.  I have reviewed the above documentation for accuracy and completeness, and I agree with the above. Marjory Sneddon, D.O.  The [place] was signed into law in 2016 which includes the topic of electronic health records.  This provides immediate access to information in MyChart.  This includes consultation notes, operative notes, office notes, lab results and pathology reports.  If you have any questions about what you read please let us know at your next visit so we can discuss your concerns and take corrective action if need be.  We are right here with you.

## 2021-08-09 ENCOUNTER — Other Ambulatory Visit (HOSPITAL_COMMUNITY): Payer: Self-pay

## 2021-08-16 ENCOUNTER — Inpatient Hospital Stay: Payer: Medicare HMO | Admitting: Hematology

## 2021-08-16 ENCOUNTER — Telehealth: Payer: Self-pay

## 2021-08-16 NOTE — Telephone Encounter (Signed)
LVM informing pt that [name] cancelled her appt today.  Pt was referred to [name] by pt's PCP Dr. Wynetta Emery.  [name] reviewed pt's chart and felt their was no need for an appt today since pt's bone marrow biopsy was negative. ?

## 2021-08-17 ENCOUNTER — Other Ambulatory Visit (HOSPITAL_BASED_OUTPATIENT_CLINIC_OR_DEPARTMENT_OTHER): Payer: Self-pay

## 2021-08-21 ENCOUNTER — Encounter (INDEPENDENT_AMBULATORY_CARE_PROVIDER_SITE_OTHER): Payer: Self-pay

## 2021-08-21 ENCOUNTER — Other Ambulatory Visit: Payer: Self-pay

## 2021-08-21 ENCOUNTER — Ambulatory Visit (INDEPENDENT_AMBULATORY_CARE_PROVIDER_SITE_OTHER): Payer: Medicare HMO | Admitting: Family Medicine

## 2021-08-21 ENCOUNTER — Encounter (INDEPENDENT_AMBULATORY_CARE_PROVIDER_SITE_OTHER): Payer: Self-pay | Admitting: Nurse Practitioner

## 2021-08-21 ENCOUNTER — Encounter (INDEPENDENT_AMBULATORY_CARE_PROVIDER_SITE_OTHER): Payer: Self-pay | Admitting: Family Medicine

## 2021-08-21 ENCOUNTER — Encounter: Payer: Self-pay | Admitting: Internal Medicine

## 2021-08-21 ENCOUNTER — Ambulatory Visit (INDEPENDENT_AMBULATORY_CARE_PROVIDER_SITE_OTHER): Payer: Medicare HMO | Admitting: Nurse Practitioner

## 2021-08-21 VITALS — BP 130/86 | HR 76 | Temp 98.5°F | Ht 65.0 in | Wt 198.0 lb

## 2021-08-21 DIAGNOSIS — E1169 Type 2 diabetes mellitus with other specified complication: Secondary | ICD-10-CM

## 2021-08-21 DIAGNOSIS — E1122 Type 2 diabetes mellitus with diabetic chronic kidney disease: Secondary | ICD-10-CM | POA: Diagnosis not present

## 2021-08-21 DIAGNOSIS — Z6833 Body mass index (BMI) 33.0-33.9, adult: Secondary | ICD-10-CM | POA: Diagnosis not present

## 2021-08-21 DIAGNOSIS — E669 Obesity, unspecified: Secondary | ICD-10-CM | POA: Diagnosis not present

## 2021-08-21 DIAGNOSIS — I10 Essential (primary) hypertension: Secondary | ICD-10-CM

## 2021-08-21 DIAGNOSIS — N183 Chronic kidney disease, stage 3 unspecified: Secondary | ICD-10-CM | POA: Diagnosis not present

## 2021-08-21 DIAGNOSIS — Z794 Long term (current) use of insulin: Secondary | ICD-10-CM | POA: Diagnosis not present

## 2021-08-21 NOTE — Progress Notes (Signed)
? ? ? ?Chief Complaint:  ? ?OBESITY ?Madhuri is here to discuss her progress with her obesity treatment plan along with follow-up of her obesity related diagnoses. Quinne is on the Category 2 Plan and keeping a food journal and adhering to recommended goals of 1200-1350 calories and 100 grams of protein and states she is following her eating plan approximately 80% of the time. Cherye states she is doing 0 minutes 0 times per week. ? ?Today's visit was #: 36 ?Starting weight: 228 lbs ?Starting date: 02/15/2020 ?Today's weight: 198 lbs ?Today's date: 08/21/2021 ?Total lbs lost to date: 30 lbs ?Total lbs lost since last in-office visit: 0 ? ?Interim History: Renell has overall done well with weight loss. Her son was in the hospital for several days and she wasn't able to follow her meal plan. She is struggling with eating protein, "getting enough".  ? ?Subjective:  ? ?1. Type 2 diabetes mellitus with other specified complication, with long-term current use of insulin ([place]) ?Rome is taking Mounjaro 10 mg. She denies side effects. Her last A1C was 6.2 on 06/26/2021. She denies hypoglycemia. She is on Arb and statin. Her eye exam is up to date.  ? ?2. Stage 3 chronic kidney disease, unspecified whether stage 3a or 3b CKD ([place]) ?Kadijah's last CMP was on 07/04/2021 and it was stable.  ? ?3. Essential hypertension ?Rhegan is on ARB, CCB and BB. She denies side effects. She denies chest pains or palpitations ? ?Assessment/Plan:  ? ?1. Type 2 diabetes mellitus with other specified complication, with long-term current use of insulin ([place]) ?Jaquana will continue Mounjaro 10 mg. We discussed side effects today. Good blood sugar control is important to decrease the likelihood of diabetic complications such as nephropathy, neuropathy, limb loss, blindness, coronary artery disease, and death. Intensive lifestyle modification including diet, exercise and weight loss are the first line of treatment for diabetes.  ? ?2. Stage 3 chronic  kidney disease, unspecified whether stage 3a or 3b CKD ([place]) ?Lab results and trends reviewed. Seeley will continue to follow up with her primary care physician. We discussed several lifestyle modifications today and she will continue to work on diet, exercise and weight loss efforts. Avoid nephrotoxic medications. Orders and follow up as documented in patient record.  ? ?Counseling ?Chronic kidney disease (CKD) happens when the kidneys are damaged over a long period of time. ?Most of the time, this condition does not go away, but it can usually be controlled. Steps must be taken to slow down the kidney damage or to stop it from getting worse. ?Intensive lifestyle modifications are the first line treatment for this issue.  ?Avoid buying foods that are: processed, frozen, or prepackaged to avoid excess salt. ?  ? ?3. Essential hypertension ?Dezzie will continue to follow up with her primary care physician. She will continue medications as directed. She is working on healthy weight loss and exercise to improve blood pressure control. We will watch for signs of hypotension as she continues her lifestyle modifications. ? ?4. Obesity with current BMI of 33.1 ?Minnetta is currently in the action stage of change. As such, her goal is to continue with weight loss efforts. She has agreed to the Category 2 Plan and keeping a food journal and adhering to recommended goals of 1200-1350 calories and 100 plus grams of protein.  ? ?Lauria was provided a protein food content packet and was reviewed with her.  ? ?Exercise goals:  Nakia is seeing her surgeon tomorrow and hopes to be  released to go back to the gym.  ? ?Behavioral modification strategies: increasing lean protein intake, increasing water intake, no skipping meals, and meal planning and cooking strategies. ? ?Tinlee has agreed to follow-up with our clinic in 2 weeks. She was informed of the importance of frequent follow-up visits to maximize her success with intensive  lifestyle modifications for her multiple health conditions.  ? ?Objective:  ? ?Blood pressure 130/86, pulse 76, temperature 98.5 ?F (36.9 ?C), height '5\' 5"'$  (1.651 m), weight 198 lb (89.8 kg), SpO2 99 %. ?Body mass index is 32.95 kg/m?. ? ?General: Cooperative, alert, well developed, in no acute distress. ?HEENT: Conjunctivae and lids unremarkable. ?Cardiovascular: Regular rhythm.  ?Lungs: Normal work of breathing. ?Neurologic: No focal deficits.  ? ?Lab Results  ?Component Value Date  ? CREATININE 1.23 (H) 07/04/2021  ? BUN 13 07/04/2021  ? NA 132 (L) 07/04/2021  ? K 4.4 07/04/2021  ? CL 103 07/04/2021  ? CO2 22 07/04/2021  ? ?Lab Results  ?Component Value Date  ? ALT 23 03/07/2021  ? AST 24 03/07/2021  ? ALKPHOS 68 03/07/2021  ? BILITOT 0.6 03/07/2021  ? ?Lab Results  ?Component Value Date  ? HGBA1C 6.2 (H) 06/26/2021  ? HGBA1C 7.0 (H) 03/07/2021  ? HGBA1C 7.2 (H) 11/29/2020  ? HGBA1C 5.6 06/14/2020  ? HGBA1C 6.9 (H) 04/17/2020  ? ?No results found for: INSULIN ?Lab Results  ?Component Value Date  ? TSH 0.938 02/15/2020  ? ?Lab Results  ?Component Value Date  ? CHOL 145 11/29/2020  ? HDL 44 11/29/2020  ? [place] 60 11/29/2020  ? TRIG 261 (H) 11/29/2020  ? CHOLHDL 3.3 11/29/2020  ? ?Lab Results  ?Component Value Date  ? VD25OH 99.6 06/26/2021  ? VD25OH 81.1 03/07/2021  ? VD25OH 60.6 11/29/2020  ? ?Lab Results  ?Component Value Date  ? WBC 5.2 06/26/2021  ? HGB 13.2 06/26/2021  ? HCT 41.2 06/26/2021  ? MCV 92.8 06/26/2021  ? PLT 309 06/26/2021  ? ?Lab Results  ?Component Value Date  ? IRON 43 08/18/2010  ? TIBC 389 08/18/2010  ? FERRITIN 227 08/18/2010  ? ?Attestation Statements:  ? ?Reviewed by clinician on day of visit: allergies, medications, problem list, medical history, surgical history, family history, social history, and previous encounter notes. ? ?Time spent on visit including pre-visit chart review and post-visit care and charting was 30 minutes.  ? ?I, Lizbeth Bark, RMA, am acting as transcriptionist for  Everardo Pacific, FNP ? ?I have reviewed the above documentation for accuracy and completeness, and I agree with the above. Everardo Pacific, FNP  ?

## 2021-08-22 ENCOUNTER — Other Ambulatory Visit: Payer: Self-pay | Admitting: Internal Medicine

## 2021-08-22 DIAGNOSIS — Z9889 Other specified postprocedural states: Secondary | ICD-10-CM | POA: Diagnosis not present

## 2021-08-22 DIAGNOSIS — Z8719 Personal history of other diseases of the digestive system: Secondary | ICD-10-CM | POA: Diagnosis not present

## 2021-08-22 NOTE — Telephone Encounter (Signed)
Sarah [name]

## 2021-08-22 NOTE — Telephone Encounter (Signed)
Requested Prescriptions  ?Pending Prescriptions Disp Refills  ?? DROPLET PEN NEEDLES 29G X 12MM MISC [Pharmacy Med Name: DROPLET PEN NEEDLES 29GX12MM 29G X 12MM] 100 each 0  ?  Sig: USE AS DIRECTED  ?  ? Endocrinology: Diabetes - Testing Supplies Passed - 08/22/2021  3:29 AM  ?  ?  Passed - Valid encounter within last 12 months  ?  Recent Outpatient Visits   ?      ? 2 months ago Type 2 diabetes mellitus with morbid obesity ([place])  ? [place] Karle Plumber B, MD  ? 6 months ago Type 2 diabetes mellitus with obesity ([place])  ? [place] Karle Plumber B, MD  ? 7 months ago Cutaneous abscess of back excluding buttocks  ? [place], Kriste Basque, NP  ? 11 months ago Type 2 diabetes mellitus with obesity ([place])  ? [place] Ladell Pier, MD  ? 1 year ago Need for shingles vaccine  ? [place], RPH-CPP  ?  ?  ?Future Appointments   ?        ? In 1 month Ladell Pier, MD [place]  ?  ? ?  ?  ?  ? ?

## 2021-08-27 ENCOUNTER — Other Ambulatory Visit (HOSPITAL_COMMUNITY): Payer: Self-pay

## 2021-09-01 ENCOUNTER — Other Ambulatory Visit: Payer: Self-pay | Admitting: Internal Medicine

## 2021-09-01 DIAGNOSIS — I10 Essential (primary) hypertension: Secondary | ICD-10-CM

## 2021-09-03 NOTE — Telephone Encounter (Signed)
Requested Prescriptions  ?Pending Prescriptions Disp Refills  ?? metoprolol tartrate (LOPRESSOR) 25 MG tablet [Pharmacy Med Name: METOPROLOL TARTRATE 25 MG Tablet] 90 tablet 0  ?  Sig: TAKE 1/2 TABLET TWICE DAILY  ?  ? Cardiovascular:  Beta Blockers Passed - 09/01/2021  6:09 AM  ?  ?  Passed - Last BP in normal range  ?  BP Readings from Last 1 Encounters:  ?08/21/21 130/86  ?   ?  ?  Passed - Last Heart Rate in normal range  ?  Pulse Readings from Last 1 Encounters:  ?08/21/21 76  ?   ?  ?  Passed - Valid encounter within last 6 months  ?  Recent Outpatient Visits   ?      ? 3 months ago Type 2 diabetes mellitus with morbid obesity ([place])  ? [place] Karle Plumber B, MD  ? 7 months ago Type 2 diabetes mellitus with obesity ([place])  ? [place] Karle Plumber B, MD  ? 7 months ago Cutaneous abscess of back excluding buttocks  ? [place], NP  ? 1 year ago Type 2 diabetes mellitus with obesity ([place])  ? [place] Ladell Pier, MD  ? 1 year ago Need for shingles vaccine  ? [place], RPH-CPP  ?  ?  ?Future Appointments   ?        ? In 1 month Ladell Pier, MD [place]  ?  ? ?  ?  ?  ? ?

## 2021-09-04 ENCOUNTER — Other Ambulatory Visit: Payer: Self-pay

## 2021-09-04 ENCOUNTER — Encounter (INDEPENDENT_AMBULATORY_CARE_PROVIDER_SITE_OTHER): Payer: Self-pay | Admitting: Physician Assistant

## 2021-09-04 ENCOUNTER — Ambulatory Visit (INDEPENDENT_AMBULATORY_CARE_PROVIDER_SITE_OTHER): Payer: Medicare HMO | Admitting: Physician Assistant

## 2021-09-04 VITALS — BP 118/71 | HR 90 | Temp 97.2°F | Ht 65.0 in | Wt 200.0 lb

## 2021-09-04 DIAGNOSIS — E669 Obesity, unspecified: Secondary | ICD-10-CM

## 2021-09-04 DIAGNOSIS — Z6833 Body mass index (BMI) 33.0-33.9, adult: Secondary | ICD-10-CM

## 2021-09-04 DIAGNOSIS — F39 Unspecified mood [affective] disorder: Secondary | ICD-10-CM

## 2021-09-04 DIAGNOSIS — E1169 Type 2 diabetes mellitus with other specified complication: Secondary | ICD-10-CM | POA: Diagnosis not present

## 2021-09-04 DIAGNOSIS — Z7985 Long-term (current) use of injectable non-insulin antidiabetic drugs: Secondary | ICD-10-CM | POA: Diagnosis not present

## 2021-09-04 NOTE — Progress Notes (Signed)
? ? ? ?Chief Complaint:  ? ?OBESITY ?Cyanna is here to discuss her progress with her obesity treatment plan along with follow-up of her obesity related diagnoses. Natsuko is on the Category 2 Plan and keeping a food journal and adhering to recommended goals of 1200-1350 calories and 100 grams of  protein and states she is following her eating plan approximately 85% of the time. Tiffanee states she is doing Chief of Staff for 45 minutes 3 times per week. ? ?Today's visit was #: 54 ?Starting weight: 228 lbs ?Starting date: 02/15/2020 ?Today's weight: 200 lbs ?Today's date: 09/04/2021 ?Total lbs lost to date: 28 lbs ?Total lbs lost since last in-office visit: 0 ? ?Interim History: Robert notes a great deal of stress the last few weeks. She has not been eating much due to lack of appetite on some days and other days she is overeating. She reports she has been in a "difficult head space" the last few weeks. ? ?Subjective:  ? ?1. Type 2 diabetes mellitus with other specified complication, with long-term current use of insulin ([place]) ?Samuel is currently on Mounjaro. Her appetite is controlled.  ? ?2. Mood disorder ([place]) with emotional eating ?Cayleen is on Wellbutrin currently. She is taking her medication as prescribed. He mood is stable.  ? ?Assessment/Plan:  ? ?1. Type 2 diabetes mellitus with other specified complication, with long-term current use of insulin ([place]) ?Emmamae will continue with Mounjaro and the plane. Good blood sugar control is important to decrease the likelihood of diabetic complications such as nephropathy, neuropathy, limb loss, blindness, coronary artery disease, and death. Intensive lifestyle modification including diet, exercise and weight loss are the first line of treatment for diabetes.  ? ?2. Mood disorder ([place]) with emotional eating ?We will refill Wellbutrin for 1 month with no refills. Behavior modification techniques were discussed today to help Mery deal with her emotional/non-hunger eating  behaviors.  Orders and follow up as documented in patient record.  ? ?3. Obesity with current BMI of 33.1 ?Norvella is currently in the action stage of change. As such, her goal is to continue with weight loss efforts. She has agreed to keeping a food journal and adhering to recommended goals of 1200-1350 calories and 100 grams of protein daily.   ? ?Exercise goals:  As is. ? ?Behavioral modification strategies: increasing lean protein intake and no skipping meals. ? ?Shulamit has agreed to follow-up with our clinic in 2 weeks. She was informed of the importance of frequent follow-up visits to maximize her success with intensive lifestyle modifications for her multiple health conditions.  ? ?Objective:  ? ?Blood pressure 118/71, pulse 90, temperature (!) 97.2 ?F (36.2 ?C), height '5\' 5"'$  (1.651 m), weight 200 lb (90.7 kg), SpO2 100 %. ?Body mass index is 33.28 kg/m?. ? ?General: Cooperative, alert, well developed, in no acute distress. ?HEENT: Conjunctivae and lids unremarkable. ?Cardiovascular: Regular rhythm.  ?Lungs: Normal work of breathing. ?Neurologic: No focal deficits.  ? ?Lab Results  ?Component Value Date  ? CREATININE 1.23 (H) 07/04/2021  ? BUN 13 07/04/2021  ? NA 132 (L) 07/04/2021  ? K 4.4 07/04/2021  ? CL 103 07/04/2021  ? CO2 22 07/04/2021  ? ?Lab Results  ?Component Value Date  ? ALT 23 03/07/2021  ? AST 24 03/07/2021  ? ALKPHOS 68 03/07/2021  ? BILITOT 0.6 03/07/2021  ? ?Lab Results  ?Component Value Date  ? HGBA1C 6.2 (H) 06/26/2021  ? HGBA1C 7.0 (H) 03/07/2021  ? HGBA1C 7.2 (H) 11/29/2020  ? HGBA1C  5.6 06/14/2020  ? HGBA1C 6.9 (H) 04/17/2020  ? ?No results found for: INSULIN ?Lab Results  ?Component Value Date  ? TSH 0.938 02/15/2020  ? ?Lab Results  ?Component Value Date  ? CHOL 145 11/29/2020  ? HDL 44 11/29/2020  ? [place] 60 11/29/2020  ? TRIG 261 (H) 11/29/2020  ? CHOLHDL 3.3 11/29/2020  ? ?Lab Results  ?Component Value Date  ? VD25OH 99.6 06/26/2021  ? VD25OH 81.1 03/07/2021  ? VD25OH 60.6  11/29/2020  ? ?Lab Results  ?Component Value Date  ? WBC 5.2 06/26/2021  ? HGB 13.2 06/26/2021  ? HCT 41.2 06/26/2021  ? MCV 92.8 06/26/2021  ? PLT 309 06/26/2021  ? ?Lab Results  ?Component Value Date  ? IRON 43 08/18/2010  ? TIBC 389 08/18/2010  ? FERRITIN 227 08/18/2010  ? ? ?Obesity Behavioral Intervention:  ? ?Approximately 15 minutes were spent on the discussion below. ? ?ASK: ?We discussed the diagnosis of obesity with [name] today and Alfonso agreed to give [place] permission to discuss obesity behavioral modification therapy today. ? ?ASSESS: ?Dorreen has the diagnosis of obesity and her BMI today is 33.4. Semira is in the action stage of change.  ? ?ADVISE: ?Keyetta was educated on the multiple health risks of obesity as well as the benefit of weight loss to improve her health. She was advised of the need for long term treatment and the importance of lifestyle modifications to improve her current health and to decrease her risk of future health problems. ? ?AGREE: ?Multiple dietary modification options and treatment options were discussed and Cinzia agreed to follow the recommendations documented in the above note. ? ?ARRANGE: ?Rhian was educated on the importance of frequent visits to treat obesity as outlined per CMS and USPSTF guidelines and agreed to schedule her next follow up appointment today. ? ?Attestation Statements:  ? ?Reviewed by clinician on day of visit: allergies, medications, problem list, medical history, surgical history, family history, social history, and previous encounter notes. ? ?I, Tonye Pearson, am acting as Location manager for [employer], PA-C. ? ?I have reviewed the above documentation for accuracy and completeness, and I agree with the above. Abby Potash, PA-C ? ?

## 2021-09-05 MED ORDER — BUPROPION HCL ER (SR) 150 MG PO TB12: 150.0000 mg | ORAL_TABLET | Freq: Every morning | ORAL | 0 refills | Status: DC

## 2021-09-06 ENCOUNTER — Encounter: Payer: Self-pay | Admitting: Internal Medicine

## 2021-09-06 DIAGNOSIS — Z9889 Other specified postprocedural states: Secondary | ICD-10-CM | POA: Diagnosis not present

## 2021-09-06 DIAGNOSIS — I10 Essential (primary) hypertension: Secondary | ICD-10-CM

## 2021-09-06 DIAGNOSIS — Z8719 Personal history of other diseases of the digestive system: Secondary | ICD-10-CM | POA: Diagnosis not present

## 2021-09-06 MED ORDER — HYDRALAZINE HCL 25 MG PO TABS: ORAL_TABLET | ORAL | 3 refills | Status: DC

## 2021-09-10 ENCOUNTER — Other Ambulatory Visit (INDEPENDENT_AMBULATORY_CARE_PROVIDER_SITE_OTHER): Payer: Self-pay | Admitting: Family Medicine

## 2021-09-10 DIAGNOSIS — J3089 Other allergic rhinitis: Secondary | ICD-10-CM

## 2021-09-17 ENCOUNTER — Encounter: Payer: Self-pay | Admitting: Internal Medicine

## 2021-09-17 MED ORDER — METFORMIN HCL 1000 MG PO TABS: 1000.0000 mg | ORAL_TABLET | Freq: Two times a day (BID) | ORAL | 3 refills | Status: DC

## 2021-09-20 ENCOUNTER — Ambulatory Visit (INDEPENDENT_AMBULATORY_CARE_PROVIDER_SITE_OTHER): Payer: Medicare HMO | Admitting: Family Medicine

## 2021-09-20 ENCOUNTER — Other Ambulatory Visit: Payer: Self-pay | Admitting: Internal Medicine

## 2021-09-20 ENCOUNTER — Encounter (INDEPENDENT_AMBULATORY_CARE_PROVIDER_SITE_OTHER): Payer: Self-pay | Admitting: Family Medicine

## 2021-09-20 VITALS — BP 126/83 | HR 79 | Temp 98.0°F | Ht 65.0 in | Wt 198.0 lb

## 2021-09-20 DIAGNOSIS — Z6836 Body mass index (BMI) 36.0-36.9, adult: Secondary | ICD-10-CM | POA: Diagnosis not present

## 2021-09-20 DIAGNOSIS — E559 Vitamin D deficiency, unspecified: Secondary | ICD-10-CM

## 2021-09-20 DIAGNOSIS — F39 Unspecified mood [affective] disorder: Secondary | ICD-10-CM | POA: Diagnosis not present

## 2021-09-20 DIAGNOSIS — J3089 Other allergic rhinitis: Secondary | ICD-10-CM | POA: Diagnosis not present

## 2021-09-20 MED ORDER — BUPROPION HCL ER (SR) 150 MG PO TB12: 150.0000 mg | ORAL_TABLET | Freq: Two times a day (BID) | ORAL | 0 refills | Status: DC

## 2021-09-20 MED ORDER — LEVOCETIRIZINE DIHYDROCHLORIDE 5 MG PO TABS: 5.0000 mg | ORAL_TABLET | Freq: Every evening | ORAL | 0 refills | Status: DC

## 2021-09-20 MED ORDER — MONTELUKAST SODIUM 10 MG PO TABS: 10.0000 mg | ORAL_TABLET | Freq: Every day | ORAL | 0 refills | Status: DC

## 2021-09-20 NOTE — Telephone Encounter (Signed)
Rx 11/01/20 #300 12 RF- too soon ?Requested Prescriptions  ?Pending Prescriptions Disp Refills  ?? Accu-Chek Softclix Lancets lancets [Pharmacy Med Name: ACCU-CHEK SOFTCLIX LANCETS] 100 each   ?  Sig: USE AS INSTRUCTED  ?  ? Endocrinology: Diabetes - Testing Supplies Passed - 09/20/2021  3:42 AM  ?  ?  Passed - Valid encounter within last 12 months  ?  Recent Outpatient Visits   ?      ? 3 months ago Type 2 diabetes mellitus with morbid obesity ([place])  ? [place] Karle Plumber B, MD  ? 7 months ago Type 2 diabetes mellitus with obesity ([place])  ? [place] Karle Plumber B, MD  ? 8 months ago Cutaneous abscess of back excluding buttocks  ? [place], NP  ? 1 year ago Type 2 diabetes mellitus with obesity ([place])  ? [place] Ladell Pier, MD  ? 1 year ago Need for shingles vaccine  ? [place], RPH-CPP  ?  ?  ?Future Appointments   ?        ? In 2 weeks Ladell Pier, MD [place]  ?  ? ?  ?  ?  ? ?

## 2021-09-27 ENCOUNTER — Ambulatory Visit: Payer: Medicare HMO | Admitting: Internal Medicine

## 2021-10-03 NOTE — Progress Notes (Signed)
? ? ? ?Chief Complaint:  ? ?OBESITY ?Sarah [name] is here to discuss her progress with her obesity treatment plan along with follow-up of her obesity related diagnoses. Sarah [name] is on keeping a food journal and adhering to recommended goals of 1200-1350 calories and 100 grams of protein daily and states she is following her eating plan approximately 85% of the time. Sarah [name] states she is doing aerobics for 45 minutes 2 times per week. ? ?Today's visit was #: 4 ?Starting weight: 228 lbs ?Starting date: 02/15/2020 ?Today's weight: 198 lbs ?Today's date: 09/20/2021 ?Total lbs lost to date: 75 ?Total lbs lost since last in-office visit: 2 ? ?Interim History: Sarah [name] has been struggling with stress/emotions lately. She got back into gym now, but sidelined due to symptoms and sitting around the house made her feel sad/depressed. Eating not as healthy and blood sugars went up to 120's-130's versus 90-100's prior.  ? ?Subjective:  ? ?1. Environmental and seasonal allergies ?Sarah [name] has seasonal allergies that have been more bothersome with the change of weather/season. Her symptoms are well controlled on her medications. ?  ?2. Mood disorder ([place]) with emotional eating ?Sarah [name] notes she has been more sad than usual.  ? ?3. Vitamin D deficiency ?Sarah [name] stopped supplements with her last labs showing Vit D level at 99.6. ? ?Assessment/Plan:  ?No orders of the defined types were placed in this encounter. ? ? ?Medications Discontinued During This Encounter  ?Medication Reason  ? buPROPion (WELLBUTRIN SR) 150 MG 12 hr tablet Reorder  ? levocetirizine (XYZAL) 5 MG tablet Reorder  ? montelukast (SINGULAIR) 10 MG tablet Reorder  ?  ? ?Meds ordered this encounter  ?Medications  ? buPROPion (WELLBUTRIN SR) 150 MG 12 hr tablet  ?  Sig: Take 1 tablet (150 mg total) by mouth 2 (two) times daily.  ?  Dispense:  60 tablet  ?  Refill:  0  ? montelukast (SINGULAIR) 10 MG tablet  ?  Sig: Take 1 tablet (10 mg total) by mouth at bedtime.  ?   Dispense:  30 tablet  ?  Refill:  0  ? levocetirizine (XYZAL) 5 MG tablet  ?  Sig: Take 1 tablet (5 mg total) by mouth every evening.  ?  Dispense:  30 tablet  ?  Refill:  0  ?  ? ?1. Environmental and seasonal allergies ?Sarah [name] will continue her medications, and we will refill Singulair and Xyzal for 1 month. ? ?- montelukast (SINGULAIR) 10 MG tablet; Take 1 tablet (10 mg total) by mouth at bedtime.  Dispense: 30 tablet; Refill: 0 ?- levocetirizine (XYZAL) 5 MG tablet; Take 1 tablet (5 mg total) by mouth every evening.  Dispense: 30 tablet; Refill: 0 ? ?2. Mood disorder ([place]) with emotional eating ?Sarah [name] agreed to increase Wellbutrin to BID, new script given. Advised this will help with her depressive symptoms more than once PO daily. She will get back into the gym 3 days per week and walk other days for 30 minutes.  ? ?- buPROPion (WELLBUTRIN SR) 150 MG 12 hr tablet; Take 1 tablet (150 mg total) by mouth 2 (two) times daily.  Dispense: 60 tablet; Refill: 0 ? ?3. Vitamin D deficiency ?We will recheck Vitamin D level after 3 months of being off Vit D supplement; in another 4-6 weeks.  ? ?4. Obesity,current BMI 33.0 ?Sarah [name] is currently in the action stage of change. As such, her goal is to continue with weight loss efforts. She has agreed to keeping a food journal and adhering  to recommended goals of 1200-1350 calories and 100 grams of protein daily.  ? ?Increase activity, go to the gym 3 days per week and walking for 30 minutes other days as well. ? ?Exercise goals: For substantial health benefits, adults should do at least 150 minutes (2 hours and 30 minutes) a week of moderate-intensity, or 75 minutes (1 hour and 15 minutes) a week of vigorous-intensity aerobic physical activity, or an equivalent combination of moderate- and vigorous-intensity aerobic activity. Aerobic activity should be performed in episodes of at least 10 minutes, and preferably, it should be spread throughout the week. ? ?Behavioral  modification strategies: increasing lean protein intake, decreasing simple carbohydrates, and planning for success. ? ?Sarah [name] has agreed to follow-up with our clinic in 2 weeks with Sarah Potash, PA-C. She was informed of the importance of frequent follow-up visits to maximize her success with intensive lifestyle modifications for her multiple health conditions.  ? ?Objective:  ? ?Blood pressure 126/83, pulse 79, temperature 98 ?F (36.7 ?C), height '5\' 5"'$  (1.651 m), weight 198 lb (89.8 kg), SpO2 97 %. ?Body mass index is 32.95 kg/m?. ? ?General: Cooperative, alert, well developed, in no acute distress. ?HEENT: Conjunctivae and lids unremarkable. ?Cardiovascular: Regular rhythm.  ?Lungs: Normal work of breathing. ?Neurologic: No focal deficits.  ? ?Lab Results  ?Component Value Date  ? CREATININE 1.23 (H) 07/04/2021  ? BUN 13 07/04/2021  ? NA 132 (L) 07/04/2021  ? K 4.4 07/04/2021  ? CL 103 07/04/2021  ? CO2 22 07/04/2021  ? ?Lab Results  ?Component Value Date  ? ALT 23 03/07/2021  ? AST 24 03/07/2021  ? ALKPHOS 68 03/07/2021  ? BILITOT 0.6 03/07/2021  ? ?Lab Results  ?Component Value Date  ? HGBA1C 6.2 (H) 06/26/2021  ? HGBA1C 7.0 (H) 03/07/2021  ? HGBA1C 7.2 (H) 11/29/2020  ? HGBA1C 5.6 06/14/2020  ? HGBA1C 6.9 (H) 04/17/2020  ? ?No results found for: INSULIN ?Lab Results  ?Component Value Date  ? TSH 0.938 02/15/2020  ? ?Lab Results  ?Component Value Date  ? CHOL 145 11/29/2020  ? HDL 44 11/29/2020  ? [place] 60 11/29/2020  ? TRIG 261 (H) 11/29/2020  ? CHOLHDL 3.3 11/29/2020  ? ?Lab Results  ?Component Value Date  ? VD25OH 99.6 06/26/2021  ? VD25OH 81.1 03/07/2021  ? VD25OH 60.6 11/29/2020  ? ?Lab Results  ?Component Value Date  ? WBC 5.2 06/26/2021  ? HGB 13.2 06/26/2021  ? HCT 41.2 06/26/2021  ? MCV 92.8 06/26/2021  ? PLT 309 06/26/2021  ? ?Lab Results  ?Component Value Date  ? IRON 43 08/18/2010  ? TIBC 389 08/18/2010  ? FERRITIN 227 08/18/2010  ? ? ?Obesity Behavioral Intervention:  ? ?Approximately 15  minutes were spent on the discussion below. ? ?ASK: ?We discussed the diagnosis of obesity with [name] today and Melah agreed to give [place] permission to discuss obesity behavioral modification therapy today. ? ?ASSESS: ?Elorah has the diagnosis of obesity and her BMI today is 33.0. Violeta is in the action stage of change.  ? ?ADVISE: ?Driana was educated on the multiple health risks of obesity as well as the benefit of weight loss to improve her health. She was advised of the need for long term treatment and the importance of lifestyle modifications to improve her current health and to decrease her risk of future health problems. ? ?AGREE: ?Multiple dietary modification options and treatment options were discussed and Novali agreed to follow the recommendations documented in the above note. ? ?ARRANGE: ?  Sharesa was educated on the importance of frequent visits to treat obesity as outlined per CMS and USPSTF guidelines and agreed to schedule her next follow up appointment today. ? ?Attestation Statements:  ? ?Reviewed by clinician on day of visit: allergies, medications, problem list, medical history, surgical history, family history, social history, and previous encounter notes. ? ? ?I, Trixie Dredge, am acting as transcriptionist for [employer], DO. ? ?I have reviewed the above documentation for accuracy and completeness, and I agree with the above. Marjory Sneddon, D.O. ? ?The [place] was signed into law in 2016 which includes the topic of electronic health records.  This provides immediate access to information in MyChart.  This includes consultation notes, operative notes, office notes, lab results and pathology reports.  If you have any questions about what you read please let us know at your next visit so we can discuss your concerns and take corrective action if need be.  We are right here with you. ? ? ?

## 2021-10-04 ENCOUNTER — Encounter: Payer: Self-pay | Admitting: Internal Medicine

## 2021-10-04 ENCOUNTER — Ambulatory Visit
Admission: RE | Admit: 2021-10-04 | Discharge: 2021-10-04 | Disposition: A | Discharge status: Home / Self Care | Payer: Medicare HMO | Source: Ambulatory Visit | Attending: Internal Medicine | Admitting: Internal Medicine

## 2021-10-04 ENCOUNTER — Ambulatory Visit: Payer: Medicare HMO | Attending: Internal Medicine | Admitting: Internal Medicine

## 2021-10-04 ENCOUNTER — Encounter (INDEPENDENT_AMBULATORY_CARE_PROVIDER_SITE_OTHER): Payer: Self-pay | Admitting: Family Medicine

## 2021-10-04 ENCOUNTER — Other Ambulatory Visit (HOSPITAL_COMMUNITY): Payer: Self-pay

## 2021-10-04 ENCOUNTER — Telehealth (INDEPENDENT_AMBULATORY_CARE_PROVIDER_SITE_OTHER): Payer: Medicare HMO | Admitting: Family Medicine

## 2021-10-04 ENCOUNTER — Encounter (INDEPENDENT_AMBULATORY_CARE_PROVIDER_SITE_OTHER): Payer: Self-pay

## 2021-10-04 ENCOUNTER — Ambulatory Visit (INDEPENDENT_AMBULATORY_CARE_PROVIDER_SITE_OTHER): Payer: Medicare HMO | Admitting: Physician Assistant

## 2021-10-04 VITALS — BP 116/81 | HR 68 | Temp 98.7°F | Resp 16 | Wt 198.0 lb

## 2021-10-04 DIAGNOSIS — E785 Hyperlipidemia, unspecified: Secondary | ICD-10-CM

## 2021-10-04 DIAGNOSIS — E1169 Type 2 diabetes mellitus with other specified complication: Secondary | ICD-10-CM

## 2021-10-04 DIAGNOSIS — E669 Obesity, unspecified: Secondary | ICD-10-CM

## 2021-10-04 DIAGNOSIS — Z6832 Body mass index (BMI) 32.0-32.9, adult: Secondary | ICD-10-CM

## 2021-10-04 DIAGNOSIS — I152 Hypertension secondary to endocrine disorders: Secondary | ICD-10-CM | POA: Diagnosis not present

## 2021-10-04 DIAGNOSIS — N183 Chronic kidney disease, stage 3 unspecified: Secondary | ICD-10-CM

## 2021-10-04 DIAGNOSIS — F39 Unspecified mood [affective] disorder: Secondary | ICD-10-CM

## 2021-10-04 DIAGNOSIS — N3946 Mixed incontinence: Secondary | ICD-10-CM

## 2021-10-04 DIAGNOSIS — M5441 Lumbago with sciatica, right side: Secondary | ICD-10-CM

## 2021-10-04 DIAGNOSIS — M5442 Lumbago with sciatica, left side: Secondary | ICD-10-CM

## 2021-10-04 DIAGNOSIS — M545 Low back pain, unspecified: Secondary | ICD-10-CM | POA: Diagnosis not present

## 2021-10-04 DIAGNOSIS — G8929 Other chronic pain: Secondary | ICD-10-CM

## 2021-10-04 DIAGNOSIS — Z794 Long term (current) use of insulin: Secondary | ICD-10-CM

## 2021-10-04 DIAGNOSIS — E1159 Type 2 diabetes mellitus with other circulatory complications: Secondary | ICD-10-CM

## 2021-10-04 DIAGNOSIS — M5136 Other intervertebral disc degeneration, lumbar region: Secondary | ICD-10-CM | POA: Diagnosis not present

## 2021-10-04 DIAGNOSIS — E1122 Type 2 diabetes mellitus with diabetic chronic kidney disease: Secondary | ICD-10-CM | POA: Diagnosis not present

## 2021-10-04 LAB — POCT GLYCOSYLATED HEMOGLOBIN (HGB A1C): HbA1c, POC (prediabetic range): 5.7 % (ref 5.7–6.4)

## 2021-10-04 LAB — GLUCOSE, POCT (MANUAL RESULT ENTRY): POC Glucose: 89 mg/dl (ref 70–99)

## 2021-10-04 MED ORDER — TIRZEPATIDE 10 MG/0.5ML ~~LOC~~ SOAJ
10.0000 mg | SUBCUTANEOUS | 0 refills | Status: DC
  Filled 2021-10-04: qty 6, 84d supply, fill #0

## 2021-10-04 MED ORDER — SOLIFENACIN SUCCINATE 5 MG PO TABS: 5.0000 mg | ORAL_TABLET | Freq: Every day | ORAL | 1 refills | Status: DC

## 2021-10-04 NOTE — Progress Notes (Signed)
?TeleHealth Visit:  ?This visit was completed with telemedicine (audio/video) technology. ?Sarah [name] has verbally consented to this TeleHealth visit. The patient is located at home, the provider is located at home. The participants in this visit include the listed provider and patient. The visit was conducted today via MyChart video. ? ?OBESITY ?Sarah [name] is here to discuss her progress with her obesity treatment plan along with follow-up of her obesity related diagnoses.  ? ?Today's visit was # 7 ?Starting weight: 228 lbs ?Starting date: 02/15/2020 ?Weight at last in office visit: 198 lbs on 09/20/21 ?Total weight loss: 30 lbs at last in office visit on 09/20/21. ?Today's reported weight:  No weight reported. ? ?Nutrition Plan: keeping a food journal and adhering to recommended goals of 1200-1350 calories and 100 grams of protein daily.  ? .  ?Hunger is well controlled. Cravings are poorly controlled.  ?Current exercise:  Silver Sneakers class 45 minutes /week ? ?Interim History: Sarah [name] journals daily but does not generally meet her protein goals due to meal skipping.  She often skips breakfast and sometimes lunch.  She does not like eggs or yogurt much.  Sometimes she does a homemade shake made with fair life milk and [place] yogurt.  When she has lunch she generally has a sandwich with 4 ounces of meat.  Dinner is usually meat and a vegetable and she usually gets 6 ounces of meat.   ?She reports that she has some cravings for [place] fries and chips but they do not keep chips at home. ?Her mother is very supportive in her weight loss efforts. ? ?Assessment/Plan:  ?1. Type II Diabetes ?HgbA1c is at goal. ?CBGs: Fasting 97-129, bedtime 114-152  ?Any episodes of hypoglycemia? no ?Medication(s): Lantus 20 units, Mounjaro 10 mg, Metformin 1000 mg twice daily.  Denies side effects from [facility]. ? ?Lab Results  ?Component Value Date  ? HGBA1C 6.2 (H) 06/26/2021  ? HGBA1C 7.0 (H) 03/07/2021  ? HGBA1C 7.2 (H) 11/29/2020   ? ?Lab Results  ?Component Value Date  ? MICROALBUR 80 11/13/2016  ? [place] 60 11/29/2020  ? CREATININE 1.23 (H) 07/04/2021  ? ? ?Plan: ?Continue metformin and Lantus at current doses. ?Refill Mounjaro 10 mg weekly. ? ?2.  Mood disorder with emotional eating ?Bupropion dose increased from 150 mg daily to 150 mill milligrams twice daily.  She reports no side effects other than she took her second dose late 1 day and it affected her sleep.  She feels her mood is very good.  She feels cravings have improved. ? ?Plan: ?Continue bupropion 150 mg twice daily. ?She takes the first dose at 9 AM.  Advised her to take at the second dose around 2 to 3 PM. ? ?3.  Obesity: Current BMI 32.95 ?Sarah [name] is currently in the action stage of change. As such, her goal is to continue with weight loss efforts.  ?She has agreed to keeping a food journal and adhering to recommended goals of 1200-1350 calories and 100 grams of protein daily. .  ? ?Exercise goals: Continue current regimen of Silver sneakers classes 3 days/week. ? ?Behavioral modification strategies: increasing lean protein intake and no skipping meals. ?I advised her of the importance of getting in all of her meals.  ?Education provided regarding metabolism and eating regularly. ?Encouraged her to have a homemade protein shake in a.m. made from 1 [place] yogurt, 8 ounces of fair life milk, small serving of fruit. ? ?Sarah [name] has agreed to follow-up with our clinic in 3 weeks.  ? ?No  orders of the defined types were placed in this encounter. ? ? ?Medications Discontinued During This Encounter  ?Medication Reason  ? tirzepatide [name] 10 MG/0.5ML Pen Reorder  ?  ? ?Meds ordered this encounter  ?Medications  ? tirzepatide [facility]) 10 MG/0.5ML Pen  ?  Sig: Inject 10 mg (1 pen) into the skin once a week.  ?  Dispense:  6 mL  ?  Refill:  0  ?  Order Specific Question:   Supervising Provider  ?  Answer:   Dennard Nip D [[id number]]  ?   ? ?Objective:  ? ?VITALS: Per patient if  applicable, see vitals. ?GENERAL: Alert and in no acute distress. ?CARDIOPULMONARY: No increased WOB. Speaking in clear sentences.  ?PSYCH: Pleasant and cooperative. Speech normal rate and rhythm. Affect is appropriate. Insight and judgement are appropriate. Attention is focused, linear, and appropriate.  ?NEURO: Oriented as arrived to appointment on time with no prompting.  ? ?Lab Results  ?Component Value Date  ? CREATININE 1.23 (H) 07/04/2021  ? BUN 13 07/04/2021  ? NA 132 (L) 07/04/2021  ? K 4.4 07/04/2021  ? CL 103 07/04/2021  ? CO2 22 07/04/2021  ? ?Lab Results  ?Component Value Date  ? ALT 23 03/07/2021  ? AST 24 03/07/2021  ? ALKPHOS 68 03/07/2021  ? BILITOT 0.6 03/07/2021  ? ?Lab Results  ?Component Value Date  ? HGBA1C 6.2 (H) 06/26/2021  ? HGBA1C 7.0 (H) 03/07/2021  ? HGBA1C 7.2 (H) 11/29/2020  ? HGBA1C 5.6 06/14/2020  ? HGBA1C 6.9 (H) 04/17/2020  ? ?No results found for: INSULIN ?Lab Results  ?Component Value Date  ? TSH 0.938 02/15/2020  ? ?Lab Results  ?Component Value Date  ? CHOL 145 11/29/2020  ? HDL 44 11/29/2020  ? [place] 60 11/29/2020  ? TRIG 261 (H) 11/29/2020  ? CHOLHDL 3.3 11/29/2020  ? ?Lab Results  ?Component Value Date  ? WBC 5.2 06/26/2021  ? HGB 13.2 06/26/2021  ? HCT 41.2 06/26/2021  ? MCV 92.8 06/26/2021  ? PLT 309 06/26/2021  ? ?Lab Results  ?Component Value Date  ? IRON 43 08/18/2010  ? TIBC 389 08/18/2010  ? FERRITIN 227 08/18/2010  ? ?Lab Results  ?Component Value Date  ? VD25OH 99.6 06/26/2021  ? VD25OH 81.1 03/07/2021  ? VD25OH 60.6 11/29/2020  ? ? ?Attestation Statements:  ? ?Reviewed by clinician on day of visit: allergies, medications, problem list, medical history, surgical history, family history, social history, and previous encounter notes. ? ? ? ?

## 2021-10-04 NOTE — Progress Notes (Signed)
? ? ?Patient ID: Sarah [name], female    DOB: 02/09/1966  MRN: [id number] ? ?CC: Diabetes ? ? ?Subjective: ?Sarah [name] is a 56 y.o. 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 (S/p adjuvant chemotherapy completed on 12/08/14. S/p ex lap, hernia repair and small bowel resection for a presumed recurrence (benign pathology), ?Hypercalcemia with elevated free light chains (BM bx [name] negative, no need for further f/u), dx with [place] by [name], 1.1 cm RLL nodule (bx negative 01/2020), RT thyroid nodule (does not met criteria for bx 10/2019),  ? ?Had abdominal incisional hernia repair 06/2021.  She is fully healed and pleased with the results. ? ? ?DIABETES TYPE 2 ?Last A1C:   ?Results for orders placed or performed in visit on 10/04/21  ?Glucose (CBG)  ?Result Value Ref Range  ? POC Glucose 89 70 - 99 mg/dl  ?HgB A1c  ?Result Value Ref Range  ? Hemoglobin A1C    ? HbA1c POC (<> result, manual entry)    ? HbA1c, POC (prediabetic range) 5.7 5.7 - 6.4 %  ? HbA1c, POC (controlled diabetic range)    ?  ?Med Adherence:  _0  Yes Mounjaro once a wk, Lantus dec to 20 units daily and Metformin 1 gram BID ? Medication side effects:  _1  Yes -some nausea at times with [facility]   ?Home Monitoring?  _2  Yes    _3  No ?Home glucose results range:Before BF 90-129, bedtime range 130-150 ?Diet Adherence: _4  Yes    _5  No ?Exercise: _6  Yes  goes to Silver Sneaker class 3x/wk at [facility] - some aerobics and wgh ?Hypoglycemic episodes?: _7  Yes    _8  No ?Numbness of the feet? _9  Yes  in big toes  _10  No ?Retinopathy hx? _11  Yes    _12  No ?Last eye exam: up to date ?Comments: Reports compliance with taking Crestor.  She is tolerating the medication. ? ?HTN: ?Currently taking: see medication list.  She is currently on  hydralazine, Cozaar, metoprolol and amlodipine. ?Med Adherence: _13  Yes and took already this a.m   _14  No ?Medication side effects: _15  Yes    _16  No ?Adherence with salt restriction: _17  Yes    _18  No ?Home Monitoring?: _19  Yes  but not often  _20  No ?No CP/SOB/LE edema ? ?Has chronic LBP x 5 yrs; across lower back, constant ?Started after being dx with ovarian CA in 2016 and was started on chemotherapy. ?Worse with standing for longer than 5 mins and laying down.  Better if she wears brace that was given post incisional hernia repair surgery. Does not bother her in exercise class because she wears the brace.  ?-Occasional radiation down both posterior mid thigh about twice a month lasting 10 minutes. No groin anesthesia.  No incontinence of stools.  Some incontinence of urine with coughing/laughing; when she gets urge she has to go right away.  Worse when she was on HCTZ. Wears pads. ?No fever. ?Feels her back pain is a little worse compared to the past.  Last x-rays done of the lumbar spine was in 2020 which revealed good alignment and no degenerative changes.  Also had PET scan done in 11/2019 that did not reveal any metastatic lesion in the skeletal areas. ?She used to take meloxicam which helped with the back  pain but we had discontinued it based on kidney function.  I advised via MyChart message to take extra strength Tylenol twice a day as needed.  She has not been doing so. ? ?Hypercalcemia: Calcium level has improved.  Previously level was between 11-12 0.3.  Last reading was 10.5.  Appointment with Dr. Annamaria Boots was canceled.  She stated she no longer needed to see the patient as her bone marrow biopsy was negative.  Had referred for follow-up with [name].  Patient states that she received a letter from them stating that he is retiring and if she wanted to they can schedule her with another endocrinologist but they did not give her an appointment. ? ?CKD 3: Off meloxicam.  Last GFR in the low 50s. ?Patient  Active Problem List  ? Diagnosis Date Noted  ? S/P hernia repair 07/03/2021  ? Atherosclerosis of aorta ([place]) 05/28/2021  ? Mood disorder ([place]) with emotional eating 05/21/2021  ? Cutaneous abscess of back excluding buttocks 01/12/2021  ? Depressed mood, with emotional eating 10/23/2020  ? Seasonal allergies 09/05/2020  ? Class 2 severe obesity with serious comorbidity and body mass index (BMI) of 36.0 to 36.9 in adult [facility]) 07/24/2020  ? At risk for impaired metabolic function 09/32/6712  ? Hypertension associated with type 2 diabetes mellitus ([place]) 05/15/2020  ? Stage 3 chronic kidney disease ([place]) 05/01/2020  ? Type 2 diabetes mellitus with chronic kidney disease, with long-term current use of insulin ([place]) 04/17/2020  ? At risk for hypoglycemia 04/17/2020  ? B12 deficiency 02/29/2020  ? Diabetes mellitus ([place]) 02/15/2020  ? Vitamin D deficiency 02/15/2020  ? Other insomnia 01/24/2020  ? Morbid obesity ([place]) 01/24/2020  ? Elevated serum immunoglobulin free light chains 01/21/2020  ? 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 Thyroid nodule 10/14/2019  ? Renal insufficiency 06/24/2019  ? Hypercalcemia 06/24/2019  ? Hiatal hernia with GERD without esophagitis 06/18/2017  ? Need for influenza vaccination 02/17/2017  ? Ventral hernia 01/24/2016  ? Type 2 diabetes mellitus without complication, without long-term current use of insulin ([place]) 10/04/2015  ? Obesity (BMI 30-39.9) 10/04/2015  ? Hyperlipidemia associated with type 2 diabetes mellitus ([place]) 07/27/2015  ? Neuropathic pain of both legs 07/27/2015  ? Obesity (BMI 30.0-34.9) 12/24/2014  ? Genetic testing 11/01/2014  ? Constipation - functional 09/24/2014  ? Chemotherapy-induced peripheral neuropathy ([place]) 09/24/2014  ? Midline low back pain without sciatica 09/24/2014  ? Family history of colon cancer   ? Ovarian cancer, right ([place]) 07/08/2014  ? [place] adenopathy   ? Hypertension associated with diabetes ([place])   ? Right lower lobe pulmonary nodule   ?  ? ?Current  Outpatient Medications on File Prior to Visit  ?Medication Sig Dispense Refill  ? Accu-Chek Softclix Lancets lancets Check BS three times a day 300 each 12  ? albuterol (VENTOLIN HFA) 108 (90 Base) MCG/ACT inhaler Inhale 2 puffs into the lungs every 6 (six) hours as needed for wheezing or shortness of breath. 8 g 0  ? amLODipine (NORVASC) 10 MG tablet Take 1 tablet (10 mg total) by mouth daily. 90 tablet 3  ? aspirin-sod bicarb-citric acid (ALKA-SELTZER) 325 MG TBEF tablet Take 650 mg by mouth every 6 (six) hours as needed (indigestion).    ? buPROPion (WELLBUTRIN SR) 150 MG 12 hr tablet Take 1 tablet (150 mg total) by mouth 2 (two) times daily. 60 tablet 0  ? dorzolamide-timolol (COSOPT) 22.3-6.8 MG/ML ophthalmic solution Place 1 drop into both eyes 2 (two) times daily.    ?  gabapentin (NEURONTIN) 600 MG tablet Take 1 tablet (600 mg total) by mouth 3 (three) times daily. 270 tablet 3  ? hydrALAZINE (APRESOLINE) 25 MG tablet TAKE 1 AND 1/2 TABLETS THREE TIMES DAILY 405 tablet 3  ? LANTUS SOLOSTAR 100 UNIT/ML Solostar Pen INJECT 30 UNITS DAILY. INCREASE BY 2 UNITS EVERY 3 DAYS UNTIL MORNING SUGAR IS LESS THAN 130. MAX OF 40 UNITS. (Patient taking differently: Inject 20 Units into the skin at bedtime.) 45 mL 1  ? latanoprost (XALATAN) 0.005 % ophthalmic solution Place 1 drop into both eyes at bedtime.    ? levocetirizine (XYZAL) 5 MG tablet Take 1 tablet (5 mg total) by mouth every evening. 30 tablet 0  ? losartan (COZAAR) 50 MG tablet TAKE 1 TABLET EVERY DAY 90 tablet 0  ? metFORMIN (GLUCOPHAGE) 1000 MG tablet Take 1 tablet (1,000 mg total) by mouth 2 (two) times daily with a meal. 180 tablet 3  ? metoprolol tartrate (LOPRESSOR) 25 MG tablet TAKE 1/2 TABLET TWICE DAILY 90 tablet 0  ? montelukast (SINGULAIR) 10 MG tablet Take 1 tablet (10 mg total) by mouth at bedtime. 30 tablet 0  ? rosuvastatin (CRESTOR) 20 MG tablet TAKE 1 TABLET EVERY DAY 90 tablet 0  ? vitamin B-12 (CYANOCOBALAMIN) 500 MCG tablet Take 1 tablet  (500 mcg total) by mouth daily.    ? Blood Glucose Monitoring Suppl (ACCU-CHEK AVIVA PLUS) w/Device KIT USE AS DIRECTED 1 kit 0  ? DROPLET PEN NEEDLES 29G X 12MM MISC USE AS DIRECTED 100 each 0  ? glucose blood

## 2021-10-04 NOTE — Patient Instructions (Addendum)
I have enclosed some exercises to help with lower back pain.  However I would not do the stomach crunches given your history of incisional abdominal hernia. ? ?Go to the radiology department downstairs to have x-rays done on your lower back. ? ?Back Exercises ?These exercises help to make your trunk and back strong. They also help to keep the lower back flexible. Doing these exercises can help to prevent or lessen pain in your lower back. ?If you have back pain, try to do these exercises 2-3 times each day or as told by your doctor. ?As you get better, do the exercises once each day. Repeat the exercises more often as told by your doctor. ?To stop back pain from coming back, do the exercises once each day, or as told by your doctor. ?Do exercises exactly as told by your doctor. Stop right away if you feel sudden pain or your pain gets worse. ?Exercises ?Single knee to chest ?Do these steps 3-5 times in a row for each leg: ?Lie on your back on a firm bed or the floor with your legs stretched out. ?Bring one knee to your chest. ?Grab your knee or thigh with both hands and hold it in place. ?Pull on your knee until you feel a gentle stretch in your lower back or butt. ?Keep doing the stretch for 10-30 seconds. ?Slowly let go of your leg and straighten it. ?Pelvic tilt ?Do these steps 5-10 times in a row: ?Lie on your back on a firm bed or the floor with your legs stretched out. ?Bend your knees so they point up to the ceiling. Your feet should be flat on the floor. ?Tighten your lower belly (abdomen) muscles to press your lower back against the floor. This will make your tailbone point up to the ceiling instead of pointing down to your feet or the floor. ?Stay in this position for 5-10 seconds while you gently tighten your muscles and breathe evenly. ?Cat-cow ?Do these steps until your lower back bends more easily: ?Get on your hands and knees on a firm bed or the floor. Keep your hands under your shoulders, and keep  your knees under your hips. You may put padding under your knees. ?Let your head hang down toward your chest. Tighten (contract) the muscles in your belly. Point your tailbone toward the floor so your lower back becomes rounded like the back of a cat. ?Stay in this position for 5 seconds. ?Slowly lift your head. Let the muscles of your belly relax. Point your tailbone up toward the ceiling so your back forms a sagging arch like the back of a cow. ?Stay in this position for 5 seconds. ? ?Press-ups ?Do these steps 5-10 times in a row: ?Lie on your belly (face-down) on a firm bed or the floor. ?Place your hands near your head, about shoulder-width apart. ?While you keep your back relaxed and keep your hips on the floor, slowly straighten your arms to raise the top half of your body and lift your shoulders. Do not use your back muscles. You may change where you place your hands to make yourself more comfortable. ?Stay in this position for 5 seconds. Keep your back relaxed. ?Slowly return to lying flat on the floor. ? ?Bridges ?Do these steps 10 times in a row: ?Lie on your back on a firm bed or the floor. ?Bend your knees so they point up to the ceiling. Your feet should be flat on the floor. Your arms should be flat  at your sides, next to your body. ?Tighten your butt muscles and lift your butt off the floor until your waist is almost as high as your knees. If you do not feel the muscles working in your butt and the back of your thighs, slide your feet 1-2 inches (2.5-5 cm) farther away from your butt. ?Stay in this position for 3-5 seconds. ?Slowly lower your butt to the floor, and let your butt muscles relax. ?If this exercise is too easy, try doing it with your arms crossed over your chest. ?Belly crunches ?Do these steps 5-10 times in a row: ?Lie on your back on a firm bed or the floor with your legs stretched out. ?Bend your knees so they point up to the ceiling. Your feet should be flat on the floor. ?Cross your  arms over your chest. ?Tip your chin a little bit toward your chest, but do not bend your neck. ?Tighten your belly muscles and slowly raise your chest just enough to lift your shoulder blades a tiny bit off the floor. Avoid raising your body higher than that because it can put too much stress on your lower back. ?Slowly lower your chest and your head to the floor. ?Back lifts ?Do these steps 5-10 times in a row: ?Lie on your belly (face-down) with your arms at your sides, and rest your forehead on the floor. ?Tighten the muscles in your legs and your butt. ?Slowly lift your chest off the floor while you keep your hips on the floor. Keep the back of your head in line with the curve in your back. Look at the floor while you do this. ?Stay in this position for 3-5 seconds. ?Slowly lower your chest and your face to the floor. ?Contact a doctor if: ?Your back pain gets a lot worse when you do an exercise. ?Your back pain does not get better within 2 hours after you exercise. ?If you have any of these problems, stop doing the exercises. Do not do them again unless your doctor says it is okay. ?Get help right away if: ?You have sudden, very bad back pain. If this happens, stop doing the exercises. Do not do them again unless your doctor says it is okay. ?This information is not intended to replace advice given to you by your health care provider. Make sure you discuss any questions you have with your health care provider. ?Document Revised: 08/09/2020 Document Reviewed: 08/09/2020 ?Elsevier Patient Education ? [place]. ? ?

## 2021-10-08 ENCOUNTER — Encounter: Payer: Self-pay | Admitting: Internal Medicine

## 2021-10-09 ENCOUNTER — Other Ambulatory Visit (HOSPITAL_COMMUNITY): Payer: Self-pay

## 2021-10-14 ENCOUNTER — Other Ambulatory Visit: Payer: Self-pay | Admitting: Internal Medicine

## 2021-10-14 DIAGNOSIS — I10 Essential (primary) hypertension: Secondary | ICD-10-CM

## 2021-10-14 MED ORDER — LOSARTAN POTASSIUM 50 MG PO TABS: 50.0000 mg | ORAL_TABLET | Freq: Every day | ORAL | 3 refills | Status: DC

## 2021-10-17 ENCOUNTER — Encounter (INDEPENDENT_AMBULATORY_CARE_PROVIDER_SITE_OTHER): Payer: Self-pay | Admitting: Physician Assistant

## 2021-10-17 NOTE — Telephone Encounter (Signed)
Please advise 

## 2021-10-22 ENCOUNTER — Ambulatory Visit (INDEPENDENT_AMBULATORY_CARE_PROVIDER_SITE_OTHER): Payer: Medicare HMO | Admitting: Family Medicine

## 2021-10-22 ENCOUNTER — Encounter (INDEPENDENT_AMBULATORY_CARE_PROVIDER_SITE_OTHER): Payer: Self-pay | Admitting: Family Medicine

## 2021-10-22 VITALS — BP 148/91 | HR 80 | Temp 97.3°F | Ht 65.0 in | Wt 201.0 lb

## 2021-10-22 DIAGNOSIS — E669 Obesity, unspecified: Secondary | ICD-10-CM

## 2021-10-22 DIAGNOSIS — E559 Vitamin D deficiency, unspecified: Secondary | ICD-10-CM

## 2021-10-22 DIAGNOSIS — F39 Unspecified mood [affective] disorder: Secondary | ICD-10-CM | POA: Diagnosis not present

## 2021-10-22 DIAGNOSIS — Z6833 Body mass index (BMI) 33.0-33.9, adult: Secondary | ICD-10-CM | POA: Diagnosis not present

## 2021-10-22 DIAGNOSIS — E1169 Type 2 diabetes mellitus with other specified complication: Secondary | ICD-10-CM | POA: Diagnosis not present

## 2021-10-22 DIAGNOSIS — Z794 Long term (current) use of insulin: Secondary | ICD-10-CM

## 2021-10-22 DIAGNOSIS — J3089 Other allergic rhinitis: Secondary | ICD-10-CM | POA: Diagnosis not present

## 2021-10-22 MED ORDER — LEVOCETIRIZINE DIHYDROCHLORIDE 5 MG PO TABS: 5.0000 mg | ORAL_TABLET | Freq: Every evening | ORAL | 0 refills | Status: DC

## 2021-10-22 MED ORDER — MONTELUKAST SODIUM 10 MG PO TABS: 10.0000 mg | ORAL_TABLET | Freq: Every day | ORAL | 0 refills | Status: DC

## 2021-10-22 MED ORDER — BUPROPION HCL ER (SR) 150 MG PO TB12: 150.0000 mg | ORAL_TABLET | Freq: Two times a day (BID) | ORAL | 0 refills | Status: DC

## 2021-10-23 LAB — VITAMIN D 25 HYDROXY (VIT D DEFICIENCY, FRACTURES): Vit D, 25-Hydroxy: 35.7 ng/mL (ref 30.0–100.0)

## 2021-11-02 NOTE — Progress Notes (Unsigned)
Chief Complaint:   OBESITY Sarah [name] is here to discuss her progress with her obesity treatment plan along with follow-up of her obesity related diagnoses. Sarah [name] is on keeping a food journal and adhering to recommended goals of 1200-1300 calories and 100 grams protein and states she is following her eating plan approximately 70% of the time. Sarah [name] states she is doing [employer] Class 45 minutes 3 times per week.  Today's visit was #: 61 Starting weight: 228 lbs Starting date: 02/15/2020 Today's weight: 201 lbs Today's date: 10/22/2021 Total lbs lost to date: 27 Total lbs lost since last in-office visit: +3  Interim History: Sarah [name]'s son is getting married in September and she is having a lot of stress over that. Some days she goes over on her calories and other days she is way under. Pt usually falls short on proteins.  Subjective:   1. Mood disorder ([place]) with emotional eating Discussed labs with patient today. Pt's mood is stable, and she is doing very well considering. She denies need for change in medication dose. Pt also denies emotional eating.   2. Type 2 diabetes mellitus with other specified complication, with long-term current use of insulin ([place]) Discussed labs with patient today. Pt's A1c on 10/04/2021 was 5.7. When she started the program, her A1c was 9.0. She denies lows or highs in blood sugar.  3. Environmental and seasonal allergies Symptoms are well controlled on current medication regimen. Pt takes meds nightly and is tolerating them well.  4. Vitamin D deficiency Pt is not currently on a Vit D supplement, as Ergocalciferol was discontinued 07/10/2021 due to high Vit D levels.  Assessment/Plan:   Orders Placed This Encounter  Procedures   VITAMIN D 25 Hydroxy (Vit-D Deficiency, Fractures)    Medications Discontinued During This Encounter  Medication Reason   buPROPion (WELLBUTRIN SR) 150 MG 12 hr tablet Reorder   montelukast (SINGULAIR) 10 MG tablet  Reorder   levocetirizine (XYZAL) 5 MG tablet Reorder     Meds ordered this encounter  Medications   buPROPion (WELLBUTRIN SR) 150 MG 12 hr tablet    Sig: Take 1 tablet (150 mg total) by mouth 2 (two) times daily.    Dispense:  60 tablet    Refill:  0   montelukast (SINGULAIR) 10 MG tablet    Sig: Take 1 tablet (10 mg total) by mouth at bedtime.    Dispense:  30 tablet    Refill:  0   levocetirizine (XYZAL) 5 MG tablet    Sig: Take 1 tablet (5 mg total) by mouth every evening.    Dispense:  30 tablet    Refill:  0     1. Mood disorder ([place]) with emotional eating Behavior modification techniques were discussed today to help Sarah [name].  Orders and follow up as documented in patient record.   Refill- buPROPion (WELLBUTRIN SR) 150 MG 12 hr tablet; Take 1 tablet (150 mg total) by mouth 2 (two) times daily.  Dispense: 60 tablet; Refill: 0  2. Type 2 diabetes mellitus with other specified complication, with long-term current use of insulin (HCC) Good blood sugar control is important to decrease the likelihood of diabetic complications such as nephropathy, neuropathy, limb loss, blindness, coronary artery disease, and death. Intensive lifestyle modification including diet, exercise and weight loss are the first line of treatment for diabetes.  Continue Lantus 20 units and Metformin per PCP. Also continue Mounjaro, as it is working well and  pt is tolerating it well.  3. Environmental and seasonal allergies Continue current treatment plan and preventative strategies, as well.  Refill- montelukast (SINGULAIR) 10 MG tablet; Take 1 tablet (10 mg total) by mouth at bedtime.  Dispense: 30 tablet; Refill: 0 Refill- levocetirizine (XYZAL) 5 MG tablet; Take 1 tablet (5 mg total) by mouth every evening.  Dispense: 30 tablet; Refill: 0  4. Vitamin D deficiency Recheck Vit D level today. We will resume supplementation is needed.  - VITAMIN D 25 Hydroxy  (Vit-D Deficiency, Fractures)  5. Obesity, Current BMI 33.5 Sarah [name] is currently in the action stage of change. As such, her goal is to continue with weight loss efforts. She has agreed to keeping a food journal and adhering to recommended goals of 1200-1300 calories and 100+ protein.   Walk 15 minutes 2 days a week, in addition to [employer]. Pt will bring in log of her food journal totals to next OV.  Exercise goals: For substantial health benefits, adults should do at least 150 minutes (2 hours and 30 minutes) a week of moderate-intensity, or 75 minutes (1 hour and 15 minutes) a week of vigorous-intensity aerobic physical activity, or an equivalent combination of moderate- and vigorous-intensity aerobic activity. Aerobic activity should be performed in episodes of at least 10 minutes, and preferably, it should be spread throughout the week.  Behavioral modification strategies: increasing lean protein intake, no skipping meals, meal planning and cooking strategies, and keeping a strict food journal.  Sarah [name] has agreed to follow-up with our clinic in 3 weeks. She was informed of the importance of frequent follow-up visits to maximize her success with intensive lifestyle modifications for her multiple health conditions.   Sarah [name] was informed we would discuss her lab results at her next visit unless there is a critical issue that needs to be addressed sooner. Sarah [name] agreed to keep her next visit at the agreed upon time to discuss these results.  Objective:   Blood pressure (!) 148/91, pulse 80, temperature (!) 97.3 F (36.3 C), height '5\' 5"'$  (1.651 m), weight 201 lb (91.2 kg), SpO2 98 %. Body mass index is 33.45 kg/m.  General: Cooperative, alert, well developed, in no acute distress. HEENT: Conjunctivae and lids unremarkable. Cardiovascular: Regular rhythm.  Lungs: Normal work of breathing. Neurologic: No focal deficits.   Lab Results  Component Value Date   CREATININE 1.23 (H)  07/04/2021   BUN 13 07/04/2021   NA 132 (L) 07/04/2021   K 4.4 07/04/2021   CL 103 07/04/2021   CO2 22 07/04/2021   Lab Results  Component Value Date   ALT 23 03/07/2021   AST 24 03/07/2021   ALKPHOS 68 03/07/2021   BILITOT 0.6 03/07/2021   Lab Results  Component Value Date   HGBA1C 5.7 10/04/2021   HGBA1C 6.2 (H) 06/26/2021   HGBA1C 7.0 (H) 03/07/2021   HGBA1C 7.2 (H) 11/29/2020   HGBA1C 5.6 06/14/2020   No results found for: INSULIN Lab Results  Component Value Date   TSH 0.938 02/15/2020   Lab Results  Component Value Date   CHOL 145 11/29/2020   HDL 44 11/29/2020   LDLCALC 60 11/29/2020   TRIG 261 (H) 11/29/2020   CHOLHDL 3.3 11/29/2020   Lab Results  Component Value Date   VD25OH 35.7 10/22/2021   VD25OH 99.6 06/26/2021   VD25OH 81.1 03/07/2021   Lab Results  Component Value Date   WBC 5.2 06/26/2021   HGB 13.2 06/26/2021   HCT 41.2 06/26/2021  MCV 92.8 06/26/2021   PLT 309 06/26/2021   Lab Results  Component Value Date   IRON 43 08/18/2010   TIBC 389 08/18/2010   FERRITIN 227 08/18/2010    Obesity Behavioral Intervention:   Approximately 15 minutes were spent on the discussion below.  ASK: We discussed the diagnosis of obesity with Sarah [name] today and Sarah [name] agreed to give [place] permission to discuss obesity behavioral modification therapy today.  ASSESS: Sarah [name] has the diagnosis of obesity and her BMI today is 33.5. Sarah [name] is in the action stage of change.   ADVISE: Sarah [name] was educated on the multiple health risks of obesity as well as the benefit of weight loss to improve her health. She was advised of the need for long term treatment and the importance of lifestyle modifications to improve her current health and to decrease her risk of future health problems.  AGREE: Multiple dietary modification options and treatment options were discussed and Sarah [name] agreed to follow the recommendations documented in the above note.  ARRANGE: Sarah [name] was  educated on the importance of frequent visits to treat obesity as outlined per CMS and USPSTF guidelines and agreed to schedule her next follow up appointment today.  Attestation Statements:   Reviewed by clinician on day of visit: allergies, medications, problem list, medical history, surgical history, family history, social history, and previous encounter notes.  I, Kathlene November, BS, CMA, am acting as transcriptionist for [employer], DO.  I have reviewed the above documentation for accuracy and completeness, and I agree with the above. Marjory Sneddon, D.O.  The [place] was signed into law in 2016 which includes the topic of electronic health records.  This provides immediate access to information in MyChart.  This includes consultation notes, operative notes, office notes, lab results and pathology reports.  If you have any questions about what you read please let us know at your next visit so we can discuss your concerns and take corrective action if need be.  We are right here with you.

## 2021-11-06 ENCOUNTER — Ambulatory Visit (INDEPENDENT_AMBULATORY_CARE_PROVIDER_SITE_OTHER): Payer: Medicare HMO | Admitting: Family Medicine

## 2021-11-06 ENCOUNTER — Other Ambulatory Visit (HOSPITAL_COMMUNITY): Payer: Self-pay

## 2021-11-06 ENCOUNTER — Encounter (INDEPENDENT_AMBULATORY_CARE_PROVIDER_SITE_OTHER): Payer: Self-pay | Admitting: Family Medicine

## 2021-11-06 VITALS — BP 135/87 | HR 84 | Temp 97.9°F | Ht 65.0 in | Wt 201.0 lb

## 2021-11-06 DIAGNOSIS — E1169 Type 2 diabetes mellitus with other specified complication: Secondary | ICD-10-CM | POA: Diagnosis not present

## 2021-11-06 DIAGNOSIS — G4709 Other insomnia: Secondary | ICD-10-CM

## 2021-11-06 DIAGNOSIS — E669 Obesity, unspecified: Secondary | ICD-10-CM

## 2021-11-06 DIAGNOSIS — F39 Unspecified mood [affective] disorder: Secondary | ICD-10-CM

## 2021-11-06 DIAGNOSIS — Z6833 Body mass index (BMI) 33.0-33.9, adult: Secondary | ICD-10-CM

## 2021-11-06 DIAGNOSIS — Z794 Long term (current) use of insulin: Secondary | ICD-10-CM

## 2021-11-06 DIAGNOSIS — F5102 Adjustment insomnia: Secondary | ICD-10-CM

## 2021-11-06 MED ORDER — TRAZODONE HCL 50 MG PO TABS
50.0000 mg | ORAL_TABLET | Freq: Every evening | ORAL | 0 refills | Status: DC | PRN
  Filled 2021-11-06: qty 30, 30d supply, fill #0

## 2021-11-10 NOTE — Progress Notes (Signed)
Chief Complaint:   OBESITY Sarah [name] is here to discuss her progress with her obesity treatment plan along with follow-up of her obesity related diagnoses. Sarah [name] is on the Category 2 Plan and states she is following her eating plan approximately 60% of the time. Sarah [name] states she is walking and doing cardio 45 minutes 3 times per week.  Today's visit was #: 62 Starting weight: 228 lbs Starting date: 02/15/2020 Today's weight: 201 lbs Today's date: 11/06/2021 Total lbs lost to date: 27 lbs Total lbs lost since last in-office visit: 0  Interim History: Sarah [name] has had an increased stress in life lately.  Loss of friends and family, her best friend lost her husband.  Sarah [name] very tearful at office visit and feeling overwhelmed. She has not been sleeping well and not following meal plan.  She has been taking care of everyone else and not herself.   Subjective:   1. Type 2 diabetes mellitus with other specified complication, with long-term current use of insulin (HCC) Fasting blood sugars in the 140's lately.  Has been eating poorly.  She is on Mounjaro, Metformin, Lantus 20 units at night only.   Sarah [name] is tolerating medication(s) well without side effects.  Medication compliance is good as patient endorses taking it as prescribed.  The patient denies additional concerns regarding this condition.      2. Other insomnia, due to stressors Sarah [name] has trouble falling and staying asleep due to stress.  No suicidal ideations, but feeling overwhelmed and depressed lately.   3. Mood disorder ([place]) Sarah [name] notes mixed depression with anxiety, she is on Wellbutrin.  Declines need for medication increase today or change in medications.  No suicidal ideations at all.  Assessment/Plan:  No orders of the defined types were placed in this encounter.   There are no discontinued medications.   Meds ordered this encounter  Medications   traZODone (DESYREL) 50 MG tablet    Sig: Take 1 tablet  (50 mg total) by mouth at bedtime as needed for sleep.    Dispense:  30 tablet    Refill:  0    30 d supply;  ** OV for RF **   Do not send RF request     1. Type 2 diabetes mellitus with other specified complication, with long-term current use of insulin (HCC) Medication management per PCP specialist, Regarding metformin and Lantus.  Sarah [name] is aware of how to ween insulin.  Continue Mounjaro per recommendations.  No change in dose today.   2. Other insomnia, due to stressors Start Trazodone every night, #30, no refills.  Start with 1/2 tablet then increase to 1 tablet every night.  She can increase to 100 mg every night if needed.  Sleep hygiene and sleep meditations discussed with Sarah [name].   - traZODone (DESYREL) 50 MG tablet; Take 1 tablet (50 mg total) by mouth at bedtime as needed for sleep.  Dispense: 30 tablet; Refill: 0  3. Mood disorder ([place]) Sarah [name] will continue Wellbutrin 150 mg BID.  Increase exercise, walking outside.  Sarah [name] has supportive family and friends she can turn to. Get back on prudent nutritional plan as this will help her mood.   4. Obesity, Current BMI 33.5 Focus on self care right now, and meeting basic needs of sleep, nutrition, etc.   Sarah [name] is currently in the action stage of change. As such, her goal is to continue with weight loss efforts. She has agreed to the Category 2 Plan with breakfast  and lunch options.   Exercise goals:  As is.  Behavioral modification strategies: no skipping meals and meal planning and cooking strategies.  Sarah [name] has agreed to follow-up with our clinic in 2-3 weeks. She was informed of the importance of frequent follow-up visits to maximize her success with intensive lifestyle modifications for her multiple health conditions.   Objective:   Blood pressure 135/87, pulse 84, temperature 97.9 F (36.6 C), height '5\' 5"'$  (1.651 m), weight 201 lb (91.2 kg), SpO2 96 %. Body mass index is 33.45 kg/m.  General: Cooperative, alert, well  developed, in no acute distress. HEENT: Conjunctivae and lids unremarkable. Cardiovascular: Regular rhythm.  Lungs: Normal work of breathing. Neurologic: No focal deficits.   Lab Results  Component Value Date   CREATININE 1.23 (H) 07/04/2021   BUN 13 07/04/2021   NA 132 (L) 07/04/2021   K 4.4 07/04/2021   CL 103 07/04/2021   CO2 22 07/04/2021   Lab Results  Component Value Date   ALT 23 03/07/2021   AST 24 03/07/2021   ALKPHOS 68 03/07/2021   BILITOT 0.6 03/07/2021   Lab Results  Component Value Date   HGBA1C 5.7 10/04/2021   HGBA1C 6.2 (H) 06/26/2021   HGBA1C 7.0 (H) 03/07/2021   HGBA1C 7.2 (H) 11/29/2020   HGBA1C 5.6 06/14/2020   No results found for: INSULIN Lab Results  Component Value Date   TSH 0.938 02/15/2020   Lab Results  Component Value Date   CHOL 145 11/29/2020   HDL 44 11/29/2020   LDLCALC 60 11/29/2020   TRIG 261 (H) 11/29/2020   CHOLHDL 3.3 11/29/2020   Lab Results  Component Value Date   VD25OH 35.7 10/22/2021   VD25OH 99.6 06/26/2021   VD25OH 81.1 03/07/2021   Lab Results  Component Value Date   WBC 5.2 06/26/2021   HGB 13.2 06/26/2021   HCT 41.2 06/26/2021   MCV 92.8 06/26/2021   PLT 309 06/26/2021   Lab Results  Component Value Date   IRON 43 08/18/2010   TIBC 389 08/18/2010   FERRITIN 227 08/18/2010    Obesity Behavioral Intervention:   Approximately 15 minutes were spent on the discussion below.  ASK: We discussed the diagnosis of obesity with Sarah [name] today and Sarah [name] agreed to give [place] permission to discuss obesity behavioral modification therapy today.  ASSESS: Sarah [name] has the diagnosis of obesity and her BMI today is 33.5. Sarah [name] is in the action stage of change.   ADVISE: Sarah [name] was educated on the multiple health risks of obesity as well as the benefit of weight loss to improve her health. She was advised of the need for long term treatment and the importance of lifestyle modifications to improve her current health and  to decrease her risk of future health problems.  AGREE: Multiple dietary modification options and treatment options were discussed and Sarah [name] agreed to follow the recommendations documented in the above note.  ARRANGE: Sarah [name] was educated on the importance of frequent visits to treat obesity as outlined per CMS and USPSTF guidelines and agreed to schedule her next follow up appointment today.  Attestation Statements:   Reviewed by clinician on day of visit: allergies, medications, problem list, medical history, surgical history, family history, social history, and previous encounter notes.  I, Davy Pique, RMA, am acting as Location manager for [employer], DO.  I have reviewed the above documentation for accuracy and completeness, and I agree with the above. Marjory Sneddon, D.O.  The [place]  was signed into law in 2016 which includes the topic of electronic health records.  This provides immediate access to information in MyChart.  This includes consultation notes, operative notes, office notes, lab results and pathology reports.  If you have any questions about what you read please let us know at your next visit so we can discuss your concerns and take corrective action if need be.  We are right here with you.

## 2021-11-14 ENCOUNTER — Other Ambulatory Visit: Payer: Self-pay | Admitting: Internal Medicine

## 2021-11-14 DIAGNOSIS — E785 Hyperlipidemia, unspecified: Secondary | ICD-10-CM

## 2021-11-14 NOTE — Telephone Encounter (Signed)
Requested Prescriptions  Pending Prescriptions Disp Refills  . rosuvastatin (CRESTOR) 20 MG tablet [Pharmacy Med Name: ROSUVASTATIN CALCIUM 20 MG Tablet] 90 tablet 0    Sig: TAKE 1 TABLET EVERY DAY     Cardiovascular:  Antilipid - Statins 2 Failed - 11/14/2021  2:49 AM      Failed - Cr in normal range and within 360 days    Creatinine  Date Value Ref Range Status  12/29/2019 1.26 (H) 0.44 - 1.00 mg/dL Final  11/20/2016 1.1 0.6 - 1.1 mg/dL Final   Creatinine, Ser  Date Value Ref Range Status  07/04/2021 1.23 (H) 0.44 - 1.00 mg/dL Final   Creatinine, POC  Date Value Ref Range Status  11/13/2016 100 mg/dL Final   Creatinine, Urine  Date Value Ref Range Status  07/27/2015 174 20 - 320 mg/dL Final         Failed - Lipid Panel in normal range within the last 12 months    Cholesterol, Total  Date Value Ref Range Status  11/29/2020 145 100 - 199 mg/dL Final   LDL Chol Calc (NIH)  Date Value Ref Range Status  11/29/2020 60 0 - 99 mg/dL Final   HDL  Date Value Ref Range Status  11/29/2020 44 >39 mg/dL Final   Triglycerides  Date Value Ref Range Status  11/29/2020 261 (H) 0 - 149 mg/dL Final         Passed - Patient is not pregnant      Passed - Valid encounter within last 12 months    Recent Outpatient Visits          1 month ago Type 2 diabetes mellitus with obesity ([place])   [place] Harris, Neoma Laming B, MD   5 months ago Type 2 diabetes mellitus with morbid obesity ([place])   [place] Karle Plumber B, MD   9 months ago Type 2 diabetes mellitus with obesity [facility])   [place] Ladell Pier, MD   10 months ago Cutaneous abscess of back excluding buttocks   [place], Kriste Basque, NP   1 year ago Type 2 diabetes mellitus with obesity [facility])   [place], Deborah B, MD      Future Appointments             In 1 month Wynetta Emery, Dalbert Batman, MD [place]

## 2021-11-21 ENCOUNTER — Encounter (INDEPENDENT_AMBULATORY_CARE_PROVIDER_SITE_OTHER): Payer: Self-pay | Admitting: Family Medicine

## 2021-11-21 ENCOUNTER — Ambulatory Visit (INDEPENDENT_AMBULATORY_CARE_PROVIDER_SITE_OTHER): Payer: Medicare HMO | Admitting: Family Medicine

## 2021-11-21 VITALS — BP 123/84 | HR 75 | Temp 97.9°F | Ht 65.0 in | Wt 196.0 lb

## 2021-11-21 DIAGNOSIS — F39 Unspecified mood [affective] disorder: Secondary | ICD-10-CM

## 2021-11-21 DIAGNOSIS — J302 Other seasonal allergic rhinitis: Secondary | ICD-10-CM

## 2021-11-21 DIAGNOSIS — Z6832 Body mass index (BMI) 32.0-32.9, adult: Secondary | ICD-10-CM

## 2021-11-21 DIAGNOSIS — G4709 Other insomnia: Secondary | ICD-10-CM | POA: Diagnosis not present

## 2021-11-21 DIAGNOSIS — E559 Vitamin D deficiency, unspecified: Secondary | ICD-10-CM

## 2021-11-21 DIAGNOSIS — J3089 Other allergic rhinitis: Secondary | ICD-10-CM

## 2021-11-21 DIAGNOSIS — E669 Obesity, unspecified: Secondary | ICD-10-CM

## 2021-11-21 MED ORDER — MONTELUKAST SODIUM 10 MG PO TABS: 10.0000 mg | ORAL_TABLET | Freq: Every day | ORAL | 0 refills | Status: DC

## 2021-11-21 MED ORDER — BUPROPION HCL ER (SR) 150 MG PO TB12: 150.0000 mg | ORAL_TABLET | Freq: Two times a day (BID) | ORAL | 0 refills | Status: DC

## 2021-11-21 MED ORDER — LEVOCETIRIZINE DIHYDROCHLORIDE 5 MG PO TABS: 5.0000 mg | ORAL_TABLET | Freq: Every evening | ORAL | 0 refills | Status: DC

## 2021-11-21 MED ORDER — TRAZODONE HCL 100 MG PO TABS: 100.0000 mg | ORAL_TABLET | Freq: Every evening | ORAL | 0 refills | Status: DC | PRN

## 2021-11-26 NOTE — Progress Notes (Unsigned)
Chief Complaint:   OBESITY Sarah [name] is here to discuss her progress with her obesity treatment plan along with follow-up of her obesity related diagnoses. Sarah [name] is on the Category 2 Plan with breakfast and lunch options and states she is following her eating plan approximately 50% of the time. Sarah [name] states she is doing Silver Sneakers 45 minutes 3 times per week.  Today's visit was #: 49 Starting weight: 228 lbs Starting date: 02/15/2020 Today's weight: 196 lbs Today's date: 11/21/2021 Total lbs lost to date: 32 Total lbs lost since last in-office visit: 5  Interim History: Sarah [name] lost her brother-in-law yesterday, so her sister is suffering and pt is doing all she can to support her.  Subjective:   1. Vitamin D deficiency Discussed labs with patient today. Vitamin D level 35.7. Sarah [name]'s Vit D supplement was discontinued 07/10/2021 due to elevated Vit D levels above goal at 99.6.  2. Other insomnia Pt started Trazodone 50 mg at her last OV. She reports it helps very little and that she doesn't feel much when she takes it.  3. Mood disorder ([place]) with emotional eating She is doing well. Pt's mood is great and emotional eating is under good control, despite stressors.  4. Seasonal allergies Symptoms are well controlled. Sarah [name] is tolerating medication(s) well without side effects. Medication compliance is good as patient endorses taking it as prescribed. The patient denies additional concerns regarding this condition.      Assessment/Plan:   1. Vitamin D deficiency Going into summer, since pt's Vitamin D level is easily elevated, we will hold off on supplementation for now and recheck levels in 3-4 months.  2. Other insomnia The problem of recurrent insomnia was discussed. Orders and follow up as documented in patient record. Counseling: Intensive lifestyle modifications are the first line treatment for this issue. We discussed several lifestyle modifications today and  she will continue to work on diet, exercise and weight loss efforts. Increase Trazodone to 100 mg (new Rx given). Sleep hygiene counseling done.  Counseling Limit or avoid alcohol, caffeinated beverages, and cigarettes, especially close to bedtime.  Do not eat a large meal or eat spicy foods right before bedtime. This can lead to digestive discomfort that can make it hard for you to sleep. Keep a sleep diary to help you and your health care provider figure out what could be causing your insomnia.  Make your bedroom a dark, comfortable place where it is easy to fall asleep. Put up shades or blackout curtains to block light from outside. Use a white noise machine to block noise. Keep the temperature cool. Limit screen use before bedtime. This includes: Watching TV. Using your smartphone, tablet, or computer. Stick to a routine that includes going to bed and waking up at the same times every day and night. This can help you fall asleep faster. Consider making a quiet activity, such as reading, part of your nighttime routine. Try to avoid taking naps during the day so that you sleep better at night. Get out of bed if you are still awake after 15 minutes of trying to sleep. Keep the lights down, but try reading or doing a quiet activity. When you feel sleepy, go back to bed.  Increase & Refill- traZODone (DESYREL) 100 MG tablet; Take 1 tablet (100 mg total) by mouth at bedtime as needed for sleep.  Dispense: 30 tablet; Refill: 0  3. Mood disorder (Sarah [name]) with emotional eating Behavior modification techniques were discussed today to  help Sarah [name] deal with her emotional/non-hunger eating behaviors.  Orders and follow up as documented in patient record.   Refill- buPROPion (WELLBUTRIN SR) 150 MG 12 hr tablet; Take 1 tablet (150 mg total) by mouth 2 (two) times daily.  Dispense: 60 tablet; Refill: 0  4. Seasonal allergies Continue current treatment plan and preventative strategies.  Refill-  levocetirizine (XYZAL) 5 MG tablet; Take 1 tablet (5 mg total) by mouth every evening.  Dispense: 30 tablet; Refill: 0 Refill- montelukast (SINGULAIR) 10 MG tablet; Take 1 tablet (10 mg total) by mouth at bedtime.  Dispense: 30 tablet; Refill: 0  5. Obesity, Current BMI 32.7 Sarah [name] is currently in the action stage of change. As such, her goal is to continue with weight loss efforts. She has agreed to the Category 2 Plan with breakfast and lunch options.   Exercise goals:  As is but increase as tolerated.  Behavioral modification strategies: increasing lean protein intake, decreasing simple carbohydrates, and planning for success.  Sarah [name] has agreed to follow-up with our clinic in 3 weeks. She was informed of the importance of frequent follow-up visits to maximize her success with intensive lifestyle modifications for her multiple health conditions.   Objective:   Blood pressure 123/84, pulse 75, temperature 97.9 F (36.6 C), height '5\' 5"'$  (1.651 m), weight 196 lb (88.9 kg), SpO2 97 %. Body mass index is 32.62 kg/m.  General: Cooperative, alert, well developed, in no acute distress. HEENT: Conjunctivae and lids unremarkable. Cardiovascular: Regular rhythm.  Lungs: Normal work of breathing. Neurologic: No focal deficits.   Lab Results  Component Value Date   CREATININE 1.23 (H) 07/04/2021   BUN 13 07/04/2021   NA 132 (L) 07/04/2021   K 4.4 07/04/2021   CL 103 07/04/2021   CO2 22 07/04/2021   Lab Results  Component Value Date   ALT 23 03/07/2021   AST 24 03/07/2021   ALKPHOS 68 03/07/2021   BILITOT 0.6 03/07/2021   Lab Results  Component Value Date   HGBA1C 5.7 10/04/2021   HGBA1C 6.2 (H) 06/26/2021   HGBA1C 7.0 (H) 03/07/2021   HGBA1C 7.2 (H) 11/29/2020   HGBA1C 5.6 06/14/2020   No results found for: "INSULIN" Lab Results  Component Value Date   TSH 0.938 02/15/2020   Lab Results  Component Value Date   CHOL 145 11/29/2020   HDL 44 11/29/2020   LDLCALC 60  11/29/2020   TRIG 261 (H) 11/29/2020   CHOLHDL 3.3 11/29/2020   Lab Results  Component Value Date   VD25OH 35.7 10/22/2021   VD25OH 99.6 06/26/2021   VD25OH 81.1 03/07/2021   Lab Results  Component Value Date   WBC 5.2 06/26/2021   HGB 13.2 06/26/2021   HCT 41.2 06/26/2021   MCV 92.8 06/26/2021   PLT 309 06/26/2021   Lab Results  Component Value Date   IRON 43 08/18/2010   TIBC 389 08/18/2010   FERRITIN 227 08/18/2010    Obesity Behavioral Intervention:   Approximately 15 minutes were spent on the discussion below.  ASK: We discussed the diagnosis of obesity with Sarah [name] today and Sarah [name] agreed to give [place] permission to discuss obesity behavioral modification therapy today.  ASSESS: Sarah [name] has the diagnosis of obesity and her BMI today is 32.7. Sarah [name] is in the action stage of change.   ADVISE: Sarah [name] was educated on the multiple health risks of obesity as well as the benefit of weight loss to improve her health. She was advised of the need for long term  treatment and the importance of lifestyle modifications to improve her current health and to decrease her risk of future health problems.  AGREE: Multiple dietary modification options and treatment options were discussed and Sarah [name] agreed to follow the recommendations documented in the above note.  ARRANGE: Sarah [name] was educated on the importance of frequent visits to treat obesity as outlined per CMS and USPSTF guidelines and agreed to schedule her next follow up appointment today.  Attestation Statements:   Reviewed by clinician on day of visit: allergies, medications, problem list, medical history, surgical history, family history, social history, and previous encounter notes.  I, Kathlene November, BS, CMA, am acting as transcriptionist for [employer], DO.  I have reviewed the above documentation for accuracy and completeness, and I agree with the above. -  ***

## 2021-11-28 ENCOUNTER — Ambulatory Visit (INDEPENDENT_AMBULATORY_CARE_PROVIDER_SITE_OTHER): Payer: Medicare HMO | Admitting: Family Medicine

## 2021-12-05 ENCOUNTER — Ambulatory Visit (INDEPENDENT_AMBULATORY_CARE_PROVIDER_SITE_OTHER): Payer: Medicare HMO | Admitting: Family Medicine

## 2021-12-05 ENCOUNTER — Encounter (INDEPENDENT_AMBULATORY_CARE_PROVIDER_SITE_OTHER): Payer: Self-pay | Admitting: Family Medicine

## 2021-12-05 VITALS — BP 124/91 | HR 82 | Temp 97.9°F | Ht 65.0 in | Wt 200.0 lb

## 2021-12-05 DIAGNOSIS — F39 Unspecified mood [affective] disorder: Secondary | ICD-10-CM | POA: Diagnosis not present

## 2021-12-05 DIAGNOSIS — Z6833 Body mass index (BMI) 33.0-33.9, adult: Secondary | ICD-10-CM

## 2021-12-05 DIAGNOSIS — Z7985 Long-term (current) use of injectable non-insulin antidiabetic drugs: Secondary | ICD-10-CM | POA: Diagnosis not present

## 2021-12-05 DIAGNOSIS — G4709 Other insomnia: Secondary | ICD-10-CM

## 2021-12-05 DIAGNOSIS — E669 Obesity, unspecified: Secondary | ICD-10-CM | POA: Diagnosis not present

## 2021-12-05 DIAGNOSIS — Z7984 Long term (current) use of oral hypoglycemic drugs: Secondary | ICD-10-CM

## 2021-12-05 DIAGNOSIS — E1169 Type 2 diabetes mellitus with other specified complication: Secondary | ICD-10-CM | POA: Diagnosis not present

## 2021-12-06 NOTE — Progress Notes (Signed)
Chief Complaint:   OBESITY Sarah [name] is here to discuss her progress with her obesity treatment plan along with follow-up of her obesity related diagnoses. [name] is on the Category 2 Plan with breakfast and lunch options and states she is following her eating plan approximately 50% of the time. [name] states she is doing silver sneakers 45 minutes 3 times per week.  Today's visit was #: 60 Starting weight: 228 Starting date: 02/15/2020 Today's weight: 200 lbs Today's date: 12/05/2021 Total lbs lost to date: 28 lbs Total lbs lost since last in-office visit: +4  Interim History: Brother in Pharmacist, hospital funeral was Saturday.  She has been skipping meals and foods.  She has been emotionally upset and has been very difficult to deal with.  She has been helping her sister and the rest of her family.  She is trying to do better with self care.  Subjective:   1. Other insomnia, due to emotional stress Increased Trazadone from 50-100 mg last office visit 2 weeks ago.  She is doing a little better with sleep when she goes to bed.  She has been just so busy, that she is not sleeping much.   2. Type 2 diabetes mellitus with other specified complication, without long-term current use of insulin (HCC) Fasting blood sugars 130's, no lows or highs.  She is taking lantus 20 units, metformin, Mounjaro.  3. Mood disorder ([place]) [place]'s mood is stable.  She was not as tearful about the death of her brother in law.  Her depressed mood is much better.  She is walking and exercising some to help with her mood.   Assessment/Plan:  No orders of the defined types were placed in this encounter.   There are no discontinued medications.   No orders of the defined types were placed in this encounter.    1. Other insomnia, due to emotional stress Continue Trazadone and sleep hygiene discussed with Valree.  2. Type 2 diabetes mellitus with other specified complication, without long-term current use of insulin  (HCC) Continue medications and lifestyle cahnges.  A1c at the start of the program was 9.0 and now 5.7. Braniyah is to continue current treatment plan.   3. Mood disorder (HCC) Continue Wellbutrin.  Kaelea declines need for change in dose, she is slowly improving.  4. Obesity, Current BMI 33.7 [name] is currently in the action stage of change. As such, her goal is to continue with weight loss efforts. She has agreed to the Category 2 Plan with breakfast and lunch options.    Exercise goals: For substantial health benefits, adults should do at least 150 minutes (2 hours and 30 minutes) a week of moderate-intensity, or 75 minutes (1 hour and 15 minutes) a week of vigorous-intensity aerobic physical activity, or an equivalent combination of moderate- and vigorous-intensity aerobic activity. Aerobic activity should be performed in episodes of at least 10 minutes, and preferably, it should be spread throughout the week.  Behavioral modification strategies: increasing lean protein intake, decreasing simple carbohydrates, and planning for success.  Ame has agreed to follow-up with our clinic in 4 weeks. She was informed of the importance of frequent follow-up visits to maximize her success with intensive lifestyle modifications for her multiple health conditions.   Objective:   Blood pressure (!) 124/91, pulse 82, temperature 97.9 F (36.6 C), height '5\' 5"'$  (1.651 m), weight 200 lb (90.7 kg), SpO2 100 %. Body mass index is 33.28 kg/m.  General: Cooperative, alert, well developed, in no acute  distress. HEENT: Conjunctivae and lids unremarkable. Cardiovascular: Regular rhythm.  Lungs: Normal work of breathing. Neurologic: No focal deficits.   Lab Results  Component Value Date   CREATININE 1.23 (H) 07/04/2021   BUN 13 07/04/2021   NA 132 (L) 07/04/2021   K 4.4 07/04/2021   CL 103 07/04/2021   CO2 22 07/04/2021   Lab Results  Component Value Date   ALT 23 03/07/2021   AST 24 03/07/2021    ALKPHOS 68 03/07/2021   BILITOT 0.6 03/07/2021   Lab Results  Component Value Date   HGBA1C 5.7 10/04/2021   HGBA1C 6.2 (H) 06/26/2021   HGBA1C 7.0 (H) 03/07/2021   HGBA1C 7.2 (H) 11/29/2020   HGBA1C 5.6 06/14/2020   No results found for: "INSULIN" Lab Results  Component Value Date   TSH 0.938 02/15/2020   Lab Results  Component Value Date   CHOL 145 11/29/2020   HDL 44 11/29/2020   LDLCALC 60 11/29/2020   TRIG 261 (H) 11/29/2020   CHOLHDL 3.3 11/29/2020   Lab Results  Component Value Date   VD25OH 35.7 10/22/2021   VD25OH 99.6 06/26/2021   VD25OH 81.1 03/07/2021   Lab Results  Component Value Date   WBC 5.2 06/26/2021   HGB 13.2 06/26/2021   HCT 41.2 06/26/2021   MCV 92.8 06/26/2021   PLT 309 06/26/2021   Lab Results  Component Value Date   IRON 43 08/18/2010   TIBC 389 08/18/2010   FERRITIN 227 08/18/2010    Obesity Behavioral Intervention:   Approximately 15 minutes were spent on the discussion below.  ASK: We discussed the diagnosis of obesity with [name] today and [place] agreed to give [place] permission to discuss obesity behavioral modification therapy today.  ASSESS: [name] has the diagnosis of obesity and her BMI today is 33.3. Janie is in the action stage of change.   ADVISE: Zyanya was educated on the multiple health risks of obesity as well as the benefit of weight loss to improve her health. She was advised of the need for long term treatment and the importance of lifestyle modifications to improve her current health and to decrease her risk of future health problems.  AGREE: Multiple dietary modification options and treatment options were discussed and Marcedes agreed to follow the recommendations documented in the above note.  ARRANGE: Ezelle was educated on the importance of frequent visits to treat obesity as outlined per CMS and USPSTF guidelines and agreed to schedule her next follow up appointment today.  Attestation Statements:    Reviewed by clinician on day of visit: allergies, medications, problem list, medical history, surgical history, family history, social history, and previous encounter notes.  I, Davy Pique, RMA, am acting as Location manager for [employer], DO.  I have reviewed the above documentation for accuracy and completeness, and I agree with the above. -   I have reviewed the above documentation for accuracy and completeness, and I agree with the above. Marjory Sneddon, D.O.  The [place] was signed into law in 2016 which includes the topic of electronic health records.  This provides immediate access to information in MyChart.  This includes consultation notes, operative notes, office notes, lab results and pathology reports.  If you have any questions about what you read please let us know at your next visit so we can discuss your concerns and take corrective action if need be.  We are right here with you.

## 2021-12-19 ENCOUNTER — Ambulatory Visit (INDEPENDENT_AMBULATORY_CARE_PROVIDER_SITE_OTHER): Payer: Medicare HMO | Admitting: Family Medicine

## 2021-12-24 ENCOUNTER — Other Ambulatory Visit: Payer: Self-pay | Admitting: Internal Medicine

## 2021-12-24 DIAGNOSIS — N3946 Mixed incontinence: Secondary | ICD-10-CM

## 2021-12-25 ENCOUNTER — Other Ambulatory Visit (HOSPITAL_COMMUNITY): Payer: Self-pay

## 2021-12-25 ENCOUNTER — Encounter (INDEPENDENT_AMBULATORY_CARE_PROVIDER_SITE_OTHER): Payer: Self-pay | Admitting: Family Medicine

## 2021-12-25 ENCOUNTER — Ambulatory Visit (INDEPENDENT_AMBULATORY_CARE_PROVIDER_SITE_OTHER): Payer: Medicare HMO | Admitting: Family Medicine

## 2021-12-25 VITALS — BP 130/82 | HR 77 | Temp 97.6°F | Ht 65.0 in | Wt 198.0 lb

## 2021-12-25 DIAGNOSIS — E1169 Type 2 diabetes mellitus with other specified complication: Secondary | ICD-10-CM | POA: Diagnosis not present

## 2021-12-25 DIAGNOSIS — Z6833 Body mass index (BMI) 33.0-33.9, adult: Secondary | ICD-10-CM | POA: Diagnosis not present

## 2021-12-25 DIAGNOSIS — J302 Other seasonal allergic rhinitis: Secondary | ICD-10-CM | POA: Diagnosis not present

## 2021-12-25 DIAGNOSIS — E538 Deficiency of other specified B group vitamins: Secondary | ICD-10-CM

## 2021-12-25 DIAGNOSIS — E1159 Type 2 diabetes mellitus with other circulatory complications: Secondary | ICD-10-CM | POA: Diagnosis not present

## 2021-12-25 DIAGNOSIS — I152 Hypertension secondary to endocrine disorders: Secondary | ICD-10-CM

## 2021-12-25 DIAGNOSIS — F39 Unspecified mood [affective] disorder: Secondary | ICD-10-CM

## 2021-12-25 DIAGNOSIS — E669 Obesity, unspecified: Secondary | ICD-10-CM | POA: Diagnosis not present

## 2021-12-25 DIAGNOSIS — Z794 Long term (current) use of insulin: Secondary | ICD-10-CM | POA: Diagnosis not present

## 2021-12-25 DIAGNOSIS — Z7985 Long-term (current) use of injectable non-insulin antidiabetic drugs: Secondary | ICD-10-CM

## 2021-12-25 DIAGNOSIS — Z7984 Long term (current) use of oral hypoglycemic drugs: Secondary | ICD-10-CM

## 2021-12-25 MED ORDER — TIRZEPATIDE 10 MG/0.5ML ~~LOC~~ SOAJ
10.0000 mg | SUBCUTANEOUS | 0 refills | Status: DC
  Filled 2021-12-25: qty 2, 28d supply, fill #0
  Filled 2022-02-07: qty 2, 28d supply, fill #1

## 2021-12-25 MED ORDER — MONTELUKAST SODIUM 10 MG PO TABS: 10.0000 mg | ORAL_TABLET | Freq: Every day | ORAL | 0 refills | Status: DC

## 2021-12-25 MED ORDER — BUPROPION HCL ER (SR) 150 MG PO TB12: 150.0000 mg | ORAL_TABLET | Freq: Two times a day (BID) | ORAL | 0 refills | Status: DC

## 2021-12-25 MED ORDER — LEVOCETIRIZINE DIHYDROCHLORIDE 5 MG PO TABS: 5.0000 mg | ORAL_TABLET | Freq: Every evening | ORAL | 0 refills | Status: DC

## 2021-12-25 NOTE — Telephone Encounter (Signed)
Requested Prescriptions  Pending Prescriptions Disp Refills  . solifenacin (VESICARE) 5 MG tablet [Pharmacy Med Name: SOLIFENACIN SUCCINATE 5 MG Tablet] 90 tablet 0    Sig: TAKE 1 TABLET EVERY DAY     Urology:  Bladder Agents 2 Failed - 12/24/2021 12:35 PM      Failed - Cr in normal range and within 360 days    Creatinine  Date Value Ref Range Status  12/29/2019 1.26 (H) 0.44 - 1.00 mg/dL Final  11/20/2016 1.1 0.6 - 1.1 mg/dL Final   Creatinine, Ser  Date Value Ref Range Status  07/04/2021 1.23 (H) 0.44 - 1.00 mg/dL Final   Creatinine, POC  Date Value Ref Range Status  11/13/2016 100 mg/dL Final   Creatinine, Urine  Date Value Ref Range Status  07/27/2015 174 20 - 320 mg/dL Final         Passed - ALT in normal range and within 360 days    ALT  Date Value Ref Range Status  03/07/2021 23 0 - 32 IU/L Final  12/29/2019 33 0 - 44 U/L Final  04/22/2016 24 0 - 55 U/L Final         Passed - AST in normal range and within 360 days    AST  Date Value Ref Range Status  03/07/2021 24 0 - 40 IU/L Final  12/29/2019 29 15 - 41 U/L Final  04/22/2016 27 5 - 34 U/L Final         Passed - Valid encounter within last 12 months    Recent Outpatient Visits          2 months ago Type 2 diabetes mellitus with obesity ([place])   [place], Neoma Laming B, MD   7 months ago Type 2 diabetes mellitus with morbid obesity ([place])   [place] Ladell Pier, MD   11 months ago Type 2 diabetes mellitus with obesity [facility])   [place] Ladell Pier, MD   11 months ago Cutaneous abscess of back excluding buttocks   [place], Kriste Basque, NP   1 year ago Type 2 diabetes mellitus with obesity [facility])   [place], Deborah B, MD      Future Appointments            In 2 weeks Ladell Pier, MD [place]

## 2021-12-26 ENCOUNTER — Other Ambulatory Visit: Payer: Self-pay | Admitting: Internal Medicine

## 2021-12-30 NOTE — Progress Notes (Signed)
Chief Complaint:   OBESITY Sarah [name] is here to discuss her progress with her obesity treatment plan along with follow-up of her obesity related diagnoses. Sarah [name] is on the Category 2 Plan with breakfast and lunch options and states she is following her eating plan approximately 60% of the time. Sarah [name] states she is going to silver sneakers 45 minutes 3 times per week.  Today's visit was #: 70 Starting weight: 228 lbs Starting date: 02/15/2020 Today's weight: 198 lbs Today's date: 12/25/2021 Total lbs lost to date: 30 lbs Total lbs lost since last in-office visit: 2 lbs  Interim History: Sarah [name] has not been eating meals and snacking a lot.  Not eating breakfast or lunch.  She did not have the foods on plan in her home.   Subjective:   1. Type 2 diabetes mellitus with other specified complication, with long-term current use of insulin (HCC) Sarah [name]'s blood sugars has been running in the 130's.  Her last A1c 5.7, about 2-1/2 months ago.  She is using 20 units of Lantus, she is also taking Metformin and Mounjaro.  2. Hypertension associated with type 2 diabetes mellitus ([place]) Blood pressures at home about 130's/70-80's.  Sarah [name] is wondering if she can decrease medications.  3. B12 deficiency She taking 500 mcg daily.   4. Seasonal allergies Has not been coughing or sneezing lately even with all the pollen and smoke.  Symptoms are under great control.   5. Mood disorder ([place]) with emotional eating Sarah [name] is struggling with emotional eating and using food for comfort to the extent that it is negatively impacting her health. She has been working on behavior modification techniques to help reduce her emotional eating and has been minimally successful. She shows no signs of suicidal or homicidal ideations. Mood is stable, no issues or concerns.  Assessment/Plan:  No orders of the defined types were placed in this encounter.   Medications Discontinued During This Encounter  Medication  Reason   tirzepatide (MOUNJARO) 10 MG/0.5ML Pen Reorder   buPROPion (WELLBUTRIN SR) 150 MG 12 hr tablet Reorder   levocetirizine (XYZAL) 5 MG tablet Reorder   montelukast (SINGULAIR) 10 MG tablet Reorder     Meds ordered this encounter  Medications   levocetirizine (XYZAL) 5 MG tablet    Sig: Take 1 tablet (5 mg total) by mouth every evening.    Dispense:  30 tablet    Refill:  0   montelukast (SINGULAIR) 10 MG tablet    Sig: Take 1 tablet (10 mg total) by mouth at bedtime.    Dispense:  30 tablet    Refill:  0   tirzepatide (MOUNJARO) 10 MG/0.5ML Pen    Sig: Inject 10 mg (1 pen) into the skin once a week.    Dispense:  6 mL    Refill:  0   buPROPion (WELLBUTRIN SR) 150 MG 12 hr tablet    Sig: Take 1 tablet (150 mg total) by mouth 2 (two) times daily.    Dispense:  60 tablet    Refill:  0     1. Type 2 diabetes mellitus with other specified complication, with long-term current use of insulin (HCC) No change in dose.  She needs to remember not to skip meals.  Refill- tirzepatide (MOUNJARO) 10 MG/0.5ML Pen; Inject 10 mg (1 pen) into the skin once a week.  Dispense: 6 mL; Refill: 0  2. Hypertension associated with type 2 diabetes mellitus ([place]) Blood pressure at goal, no change in her medications  today unless PCP feels otherwise.  Decrease salt intake.  Continue prudent nutritional plan and weight loss.   3. B12 deficiency Continue B12 supplement.  4. Seasonal allergies Continue preventative strategies.  Refill - levocetirizine (XYZAL) 5 MG tablet; Take 1 tablet (5 mg total) by mouth every evening.  Dispense: 30 tablet; Refill: 0  Refill - montelukast (SINGULAIR) 10 MG tablet; Take 1 tablet (10 mg total) by mouth at bedtime.  Dispense: 30 tablet; Refill: 0  5. Mood disorder (Sarah [name]) with emotional eating Behavior modification techniques were discussed today to help Sarah [name] deal with her emotional/non-hunger eating behaviors.  Orders and follow up as documented in patient  record.   Refill - buPROPion (WELLBUTRIN SR) 150 MG 12 hr tablet; Take 1 tablet (150 mg total) by mouth 2 (two) times daily.  Dispense: 60 tablet; Refill: 0  6. Obesity, Current BMI 33 Do not skip meals, but especially do not skip on proteins, make sure you weigh them.    Sarah [name] is currently in the action stage of change. As such, her goal is to continue with weight loss efforts. She has agreed to the Category 2 Plan with breakfast and lunch options.  Exercise goals:  As is.  Behavioral modification strategies: no skipping meals.  Sarah [name] has agreed to follow-up with our clinic in 3 weeks, come in fasting for blood work and fasting repeat IC, come in 30 minutes prior.  She was informed of the importance of frequent follow-up visits to maximize her success with intensive lifestyle modifications for her multiple health conditions.   Objective:   Blood pressure 130/82, pulse 77, temperature 97.6 F (36.4 C), height '5\' 5"'$  (1.651 m), weight 198 lb (89.8 kg), SpO2 100 %. Body mass index is 32.95 kg/m.  General: Cooperative, alert, well developed, in no acute distress. HEENT: Conjunctivae and lids unremarkable. Cardiovascular: Regular rhythm.  Lungs: Normal work of breathing. Neurologic: No focal deficits.   Lab Results  Component Value Date   CREATININE 1.23 (H) 07/04/2021   BUN 13 07/04/2021   NA 132 (L) 07/04/2021   K 4.4 07/04/2021   CL 103 07/04/2021   CO2 22 07/04/2021   Lab Results  Component Value Date   ALT 23 03/07/2021   AST 24 03/07/2021   ALKPHOS 68 03/07/2021   BILITOT 0.6 03/07/2021   Lab Results  Component Value Date   HGBA1C 5.7 10/04/2021   HGBA1C 6.2 (H) 06/26/2021   HGBA1C 7.0 (H) 03/07/2021   HGBA1C 7.2 (H) 11/29/2020   HGBA1C 5.6 06/14/2020   No results found for: "INSULIN" Lab Results  Component Value Date   TSH 0.938 02/15/2020   Lab Results  Component Value Date   CHOL 145 11/29/2020   HDL 44 11/29/2020   LDLCALC 60 11/29/2020   TRIG 261  (H) 11/29/2020   CHOLHDL 3.3 11/29/2020   Lab Results  Component Value Date   VD25OH 35.7 10/22/2021   VD25OH 99.6 06/26/2021   VD25OH 81.1 03/07/2021   Lab Results  Component Value Date   WBC 5.2 06/26/2021   HGB 13.2 06/26/2021   HCT 41.2 06/26/2021   MCV 92.8 06/26/2021   PLT 309 06/26/2021   Lab Results  Component Value Date   IRON 43 08/18/2010   TIBC 389 08/18/2010   FERRITIN 227 08/18/2010    Obesity Behavioral Intervention:   Approximately 15 minutes were spent on the discussion below.  ASK: We discussed the diagnosis of obesity with Sarah [name] today and Sarah [name] agreed to give [place] permission to  discuss obesity behavioral modification therapy today.  ASSESS: Sarah [name] has the diagnosis of obesity and her BMI today is 33.0. Sarah [name] is in the action stage of change.   ADVISE: Sarah [name] was educated on the multiple health risks of obesity as well as the benefit of weight loss to improve her health. She was advised of the need for long term treatment and the importance of lifestyle modifications to improve her current health and to decrease her risk of future health problems.  AGREE: Multiple dietary modification options and treatment options were discussed and Sarah [name] agreed to follow the recommendations documented in the above note.  ARRANGE: Sarah [name] was educated on the importance of frequent visits to treat obesity as outlined per CMS and USPSTF guidelines and agreed to schedule her next follow up appointment today.  Attestation Statements:   Reviewed by clinician on day of visit: allergies, medications, problem list, medical history, surgical history, family history, social history, and previous encounter notes.  I, Davy Pique, RMA, am acting as Location manager for [employer], DO.   I have reviewed the above documentation for accuracy and completeness, and I agree with the above. Marjory Sneddon, D.O.  The [place] was signed into law in 2016 which  includes the topic of electronic health records.  This provides immediate access to information in MyChart.  This includes consultation notes, operative notes, office notes, lab results and pathology reports.  If you have any questions about what you read please let us know at your next visit so we can discuss your concerns and take corrective action if need be.  We are right here with you.

## 2022-01-02 ENCOUNTER — Ambulatory Visit (INDEPENDENT_AMBULATORY_CARE_PROVIDER_SITE_OTHER): Payer: Medicare HMO | Admitting: Family Medicine

## 2022-01-09 ENCOUNTER — Ambulatory Visit (INDEPENDENT_AMBULATORY_CARE_PROVIDER_SITE_OTHER): Payer: Medicare HMO | Admitting: Family Medicine

## 2022-01-09 ENCOUNTER — Encounter (INDEPENDENT_AMBULATORY_CARE_PROVIDER_SITE_OTHER): Payer: Self-pay | Admitting: Family Medicine

## 2022-01-09 VITALS — BP 113/77 | HR 82 | Temp 97.8°F | Ht 65.0 in | Wt 195.0 lb

## 2022-01-09 DIAGNOSIS — E1169 Type 2 diabetes mellitus with other specified complication: Secondary | ICD-10-CM

## 2022-01-09 DIAGNOSIS — E538 Deficiency of other specified B group vitamins: Secondary | ICD-10-CM

## 2022-01-09 DIAGNOSIS — Z7985 Long-term (current) use of injectable non-insulin antidiabetic drugs: Secondary | ICD-10-CM

## 2022-01-09 DIAGNOSIS — E559 Vitamin D deficiency, unspecified: Secondary | ICD-10-CM | POA: Diagnosis not present

## 2022-01-09 DIAGNOSIS — Z7984 Long term (current) use of oral hypoglycemic drugs: Secondary | ICD-10-CM

## 2022-01-09 DIAGNOSIS — E1159 Type 2 diabetes mellitus with other circulatory complications: Secondary | ICD-10-CM | POA: Diagnosis not present

## 2022-01-09 DIAGNOSIS — E669 Obesity, unspecified: Secondary | ICD-10-CM

## 2022-01-09 DIAGNOSIS — Z6832 Body mass index (BMI) 32.0-32.9, adult: Secondary | ICD-10-CM | POA: Diagnosis not present

## 2022-01-09 DIAGNOSIS — Z794 Long term (current) use of insulin: Secondary | ICD-10-CM | POA: Diagnosis not present

## 2022-01-10 LAB — COMPREHENSIVE METABOLIC PANEL
ALT: 29 IU/L (ref 0–32)
AST: 26 IU/L (ref 0–40)
Albumin/Globulin Ratio: 1.6 (ref 1.2–2.2)
Albumin: 4.6 g/dL (ref 3.8–4.9)
Alkaline Phosphatase: 74 IU/L (ref 44–121)
BUN/Creatinine Ratio: 13 (ref 9–23)
BUN: 18 mg/dL (ref 6–24)
Bilirubin Total: 0.5 mg/dL (ref 0.0–1.2)
CO2: 23 mmol/L (ref 20–29)
Calcium: 12.1 mg/dL — ABNORMAL HIGH (ref 8.7–10.2)
Chloride: 103 mmol/L (ref 96–106)
Creatinine, Ser: 1.44 mg/dL — ABNORMAL HIGH (ref 0.57–1.00)
Globulin, Total: 2.8 g/dL (ref 1.5–4.5)
Glucose: 80 mg/dL (ref 70–99)
Potassium: 4.2 mmol/L (ref 3.5–5.2)
Sodium: 142 mmol/L (ref 134–144)
Total Protein: 7.4 g/dL (ref 6.0–8.5)
eGFR: 43 mL/min/{1.73_m2} — ABNORMAL LOW (ref >59)

## 2022-01-10 LAB — VITAMIN B12: Vitamin B-12: 980 pg/mL (ref 232–1245)

## 2022-01-10 LAB — LIPID PANEL
Chol/HDL Ratio: 3.7 ratio (ref 0.0–4.4)
Cholesterol, Total: 167 mg/dL (ref 100–199)
HDL: 45 mg/dL (ref >39)
LDL Chol Calc (NIH): 89 mg/dL (ref 0–99)
Triglycerides: 192 mg/dL — ABNORMAL HIGH (ref 0–149)
VLDL Cholesterol Cal: 33 mg/dL (ref 5–40)

## 2022-01-10 LAB — HEMOGLOBIN A1C
Est. average glucose Bld gHb Est-mCnc: 111 mg/dL
Hgb A1c MFr Bld: 5.5 % (ref 4.8–5.6)

## 2022-01-10 LAB — MAGNESIUM: Magnesium: 1.5 mg/dL — ABNORMAL LOW (ref 1.6–2.3)

## 2022-01-11 ENCOUNTER — Ambulatory Visit: Payer: Medicare HMO | Attending: Internal Medicine | Admitting: Internal Medicine

## 2022-01-11 ENCOUNTER — Encounter: Payer: Self-pay | Admitting: Internal Medicine

## 2022-01-11 VITALS — BP 120/81 | HR 78 | Temp 98.1°F | Ht 60.0 in | Wt 203.4 lb

## 2022-01-11 DIAGNOSIS — E669 Obesity, unspecified: Secondary | ICD-10-CM

## 2022-01-11 DIAGNOSIS — E785 Hyperlipidemia, unspecified: Secondary | ICD-10-CM

## 2022-01-11 DIAGNOSIS — I152 Hypertension secondary to endocrine disorders: Secondary | ICD-10-CM | POA: Diagnosis not present

## 2022-01-11 DIAGNOSIS — E1159 Type 2 diabetes mellitus with other circulatory complications: Secondary | ICD-10-CM | POA: Diagnosis not present

## 2022-01-11 DIAGNOSIS — E1122 Type 2 diabetes mellitus with diabetic chronic kidney disease: Secondary | ICD-10-CM | POA: Diagnosis not present

## 2022-01-11 DIAGNOSIS — E1169 Type 2 diabetes mellitus with other specified complication: Secondary | ICD-10-CM

## 2022-01-11 DIAGNOSIS — N183 Chronic kidney disease, stage 3 unspecified: Secondary | ICD-10-CM | POA: Diagnosis not present

## 2022-01-11 LAB — GLUCOSE, POCT (MANUAL RESULT ENTRY): POC Glucose: 104 mg/dl — AB (ref 70–99)

## 2022-01-11 NOTE — Progress Notes (Signed)
Patient ID: Sarah [name], female    DOB: [birth year]  MRN: [id number]  CC: Medical Management of Chronic Issues   Subjective: Sarah [name] is a 56 y.o. female who presents for chronic disease management. 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 (S/p adjuvant chemotherapy completed on 12/08/14. S/p ex lap, hernia repair and small bowel resection for a presumed recurrence (benign pathology), Hypercalcemia with elevated free light chains (BM bx [name] negative, no need for further f/u), dx with [place] by [name], 1.1 cm RLL nodule (bx negative 01/2020), RT thyroid nodule (does not met criteria for bx 10/2019),   DM/Obesity: Lab Results  Component Value Date   HGBA1C 5.5 01/09/2022  Gets a little nauseated with Moujario about 1-2 x a mth.  Still on 20 units Lantus and Metformin 1 gram BID BS in 130s in a.m and bedtime 150-160s.  No low BS episodes Eating well.  Down 30 lbs since she started [facility] in 2021.  Goes to [facility] 3 days a wk for aerobics class Her goal is to get to 190 lbs  HTN: Reports compliance with taking hydralazine, Cozaar, metoprolol and Norvasc. Most recent chemistry showed increased in creatinine from 1.23 to 1.44.  Her GFR also declined from the 50s to 43.  Ca+ level increased again to 12.1 she was never called by the [facility] endocrinology to get her in with another endocrinologist since [name] has left the practice. -No chest pains or shortness of breath.  No swelling in the legs.  HL: Taking and tolerating Crestor 20 mg.  Last LDL 89    Patient Active Problem List   Diagnosis Date Noted   Mixed stress and urge urinary incontinence 10/04/2021   S/P hernia repair 07/03/2021   Atherosclerosis of aorta ([place]) 05/28/2021   Mood disorder ([place]) with emotional eating 05/21/2021    Cutaneous abscess of back excluding buttocks 01/12/2021   Depressed mood, with emotional eating 10/23/2020   Seasonal allergies 09/05/2020   Class 2 severe obesity with serious comorbidity and body mass index (BMI) of 36.0 to 36.9 in adult ([place]) 07/24/2020   At risk for impaired metabolic function 73/22/0254   Hypertension associated with type 2 diabetes mellitus ([place]) 05/15/2020   Stage 3 chronic kidney disease ([place]) 05/01/2020   Type 2 diabetes mellitus with chronic kidney disease, with long-term current use of insulin ([place]) 04/17/2020   At risk for hypoglycemia 04/17/2020   B12 deficiency 02/29/2020   Diabetes mellitus ([name]) 02/15/2020   Vitamin D deficiency 02/15/2020   Other insomnia 01/24/2020   Morbid obesity ([place]) 01/24/2020   Elevated serum immunoglobulin free light chains 01/21/2020   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[place]) 01/05/2020   Thyroid nodule 10/14/2019   Renal insufficiency 06/24/2019   Hypercalcemia 06/24/2019   Hiatal hernia with GERD without esophagitis 06/18/2017   Need for influenza vaccination 02/17/2017   Ventral hernia 01/24/2016   Type 2 diabetes mellitus without complication, without long-term current use of insulin ([place]) 10/04/2015   Obesity (BMI 30-39.9) 10/04/2015   Hyperlipidemia associated with type 2 diabetes mellitus ([place]) 07/27/2015   Neuropathic pain of both legs 07/27/2015   Obesity (BMI 30.0-34.9) 12/24/2014   Genetic testing 11/01/2014   Constipation - functional 09/24/2014   Chemotherapy-induced peripheral neuropathy ([place]) 09/24/2014   Midline low back pain without sciatica 09/24/2014  Family history of colon cancer    Ovarian cancer, right ([place]) 07/08/2014   Hilar adenopathy    Hypertension associated with diabetes ([place])    Right lower lobe pulmonary nodule      Current Outpatient Medications on File Prior to Visit  Medication Sig Dispense Refill   Accu-Chek Softclix Lancets lancets TEST BLOOD SUGAR THREE TIMES DAILY 300 each 0   albuterol  (VENTOLIN HFA) 108 (90 Base) MCG/ACT inhaler Inhale 2 puffs into the lungs every 6 (six) hours as needed for wheezing or shortness of breath. 8 g 0   amLODipine (NORVASC) 10 MG tablet Take 1 tablet (10 mg total) by mouth daily. 90 tablet 3   aspirin-sod bicarb-citric acid (ALKA-SELTZER) 325 MG TBEF tablet Take 650 mg by mouth every 6 (six) hours as needed (indigestion).     Blood Glucose Monitoring Suppl (ACCU-CHEK AVIVA PLUS) w/Device KIT USE AS DIRECTED 1 kit 0   buPROPion (WELLBUTRIN SR) 150 MG 12 hr tablet Take 1 tablet (150 mg total) by mouth 2 (two) times daily. 60 tablet 0   dorzolamide-timolol (COSOPT) 22.3-6.8 MG/ML ophthalmic solution Place 1 drop into both eyes 2 (two) times daily.     DROPLET PEN NEEDLES 29G X 12MM MISC USE AS DIRECTED 100 each 0   gabapentin (NEURONTIN) 600 MG tablet Take 1 tablet (600 mg total) by mouth 3 (three) times daily. 270 tablet 3   glucose blood (ACCU-CHEK AVIVA PLUS) test strip TEST BLOOD SUGAR AS DIRECTED 300 strip 0   hydrALAZINE (APRESOLINE) 25 MG tablet TAKE 1 AND 1/2 TABLETS THREE TIMES DAILY 405 tablet 3   Lancets Misc. (ACCU-CHEK SOFTCLIX LANCET DEV) KIT Check blood sugars 3 times a day. 3 kit 6   LANTUS SOLOSTAR 100 UNIT/ML Solostar Pen INJECT 30 UNITS DAILY. INCREASE BY 2 UNITS EVERY 3 DAYS UNTIL MORNING SUGAR IS LESS THAN 130. MAX OF 40 UNITS. (Patient taking differently: Inject 20 Units into the skin at bedtime.) 45 mL 1   latanoprost (XALATAN) 0.005 % ophthalmic solution Place 1 drop into both eyes at bedtime.     levocetirizine (XYZAL) 5 MG tablet Take 1 tablet (5 mg total) by mouth every evening. 30 tablet 0   losartan (COZAAR) 50 MG tablet Take 1 tablet (50 mg total) by mouth daily. 90 tablet 3   metFORMIN (GLUCOPHAGE) 1000 MG tablet Take 1 tablet (1,000 mg total) by mouth 2 (two) times daily with a meal. 180 tablet 3   metoprolol tartrate (LOPRESSOR) 25 MG tablet TAKE 1/2 TABLET TWICE DAILY 90 tablet 0   montelukast (SINGULAIR) 10 MG tablet  Take 1 tablet (10 mg total) by mouth at bedtime. 30 tablet 0   rosuvastatin (CRESTOR) 20 MG tablet TAKE 1 TABLET EVERY DAY 90 tablet 0   solifenacin (VESICARE) 5 MG tablet TAKE 1 TABLET EVERY DAY 90 tablet 0   tirzepatide (MOUNJARO) 10 MG/0.5ML Pen Inject 10 mg (1 pen) into the skin once a week. 6 mL 0   traZODone (DESYREL) 100 MG tablet Take 1 tablet (100 mg total) by mouth at bedtime as needed for sleep. 30 tablet 0   vitamin B-12 (CYANOCOBALAMIN) 500 MCG tablet Take 1 tablet (500 mcg total) by mouth daily.     No current facility-administered medications on file prior to visit.    Allergies  Allergen Reactions   Emend [Aprepitant] Other (See Comments)    Urticaria    Lisinopril Cough    Social History   Socioeconomic History   Marital status: Married  Spouse name: Not on file   Number of children: 2   Years of education: Not on file   Highest education level: High school graduate  Occupational History   Occupation: disability  Tobacco Use   Smoking status: Never   Smokeless tobacco: Never  Vaping Use   Vaping Use: Never used  Substance and Sexual Activity   Alcohol use: No   Drug use: No   Sexual activity: Not on file  Other Topics Concern   Not on file  Social History Narrative   No issues with transportation.    Attends church   Social Determinants of Health   Financial Resource Strain: Not on file  Food Insecurity: Not on file  Transportation Needs: Not on file  Physical Activity: Not on file  Stress: Not on file  Social Connections: Not on file  Intimate Partner Violence: Not on file    Family History  Problem Relation Age of Onset   Hypertension Mother    Hyperlipidemia Mother    Stroke Mother    Thyroid disease Mother    Hypertension Father    Diabetes Father    Hyperlipidemia Father    Sudden death Father    Cancer Sister 74       fibrosarcoma (back); currently 55   Prostate cancer Maternal Uncle    Colon cancer Paternal Aunt        Dx  74s; deceased 48s   Prostate cancer Paternal Uncle        currently 74   Cancer Paternal Uncle 81       "bone" ; unk. primary   Stomach cancer Paternal Uncle    Hypercalcemia Neg Hx     Past Surgical History:  Procedure Laterality Date   ABDOMINAL HYSTERECTOMY     FOOT SURGERY  04/2018   [facility]   INCISIONAL HERNIA REPAIR N/A 07/06/2015   Procedure: INCISIONAL HERNIA REPAIR ;  Surgeon: Rolm Bookbinder, MD;  Location: [facility];  Service: General;  Laterality: N/A;   [place] N/A 07/03/2021   Procedure: Fatima Blank HERNIA REPAIR WITH MESH;  Surgeon: Ralene Ok, MD;  Location: [facility];  Service: General;  Laterality: N/A;   INSERTION OF MESH N/A 07/06/2015   Procedure: WITH INSERTION OF PHASIX ST MESH;  Surgeon: Rolm Bookbinder, MD;  Location: [facility];  Service: General;  Laterality: N/A;   LAPAROTOMY N/A 07/06/2015   Procedure: EXPLORATORY LAPAROTOMY;  Surgeon: Everitt Amber, MD;  Location: [facility];  Service: Gynecology;  Laterality: N/A;   LYSIS OF ADHESION N/A 07/06/2015   Procedure:  LYSIS OF ADHESION RESECTION OF MESENTERIC MASS BOWEL RESECTION ;  Surgeon: Everitt Amber, MD;  Location: [facility];  Service: Gynecology;  Laterality: N/A;   LYSIS OF ADHESION N/A 07/03/2021   Procedure: EXPLORATORY LAPAROTOMY WITH LYSIS OF ADHESION;  Surgeon: Ralene Ok, MD;  Location: [facility];  Service: General;  Laterality: N/A;   ROBOTIC ASSISTED TOTAL HYSTERECTOMY WITH BILATERAL SALPINGO OOPHERECTOMY Bilateral 07/28/2014   Procedure: ROBOTIC ASSISTED lysis of adhesions with biopsies, converted to LAPAROTOMY, bilateral salpingoorphorectomy, omentectomy,appendectomy;  Surgeon: Janie Morning, MD;  Location: [facility];  Service: Gynecology;  Laterality: Bilateral;   THYROIDECTOMY, PARTIAL     VIDEO BRONCHOSCOPY N/A 01/13/2020   Procedure: VIDEO BRONCHOSCOPY WITH BIOPSIES;  Surgeon: Melrose Nakayama, MD;  Location: [facility];  Service: Thoracic;  Laterality: N/A;    ROS: Review of  Systems Negative except as stated above  PHYSICAL EXAM: BP 120/81   Pulse 78   Temp  98.1 F (36.7 C) (Oral)   Ht 5' (1.524 m)   Wt 203 lb 6.4 oz (92.3 kg)   LMP  (LMP Unknown)   SpO2 94%   BMI 39.72 kg/m   Wt Readings from Last 3 Encounters:  01/11/22 203 lb 6.4 oz (92.3 kg)  01/09/22 195 lb (88.5 kg)  12/25/21 198 lb (89.8 kg)    Physical Exam  General appearance - alert, well appearing, and in no distress Mental status - normal mood, behavior, speech, dress, motor activity, and thought processes Neck - supple, no significant adenopathy Chest - clear to auscultation, no wheezes, rales or rhonchi, symmetric air entry Heart - normal rate, regular rhythm, normal S1, S2, no murmurs, rubs, clicks or gallops Extremities - peripheral pulses normal, no pedal edema, no clubbing or cyanosis      Latest Ref Rng & Units 01/09/2022    8:55 AM 07/04/2021   12:59 AM 06/26/2021    3:04 PM  CMP  Glucose 70 - 99 mg/dL 80  120  127   BUN 6 - 24 mg/dL 18  13  18    Creatinine 0.57 - 1.00 mg/dL 1.44  1.23  1.17   Sodium 134 - 144 mmol/L 142  132  139   Potassium 3.5 - 5.2 mmol/L 4.2  4.4  3.7   Chloride 96 - 106 mmol/L 103  103  102   CO2 20 - 29 mmol/L 23  22  24    Calcium 8.7 - 10.2 mg/dL 12.1  10.5  11.3   Total Protein 6.0 - 8.5 g/dL 7.4     Total Bilirubin 0.0 - 1.2 mg/dL 0.5     Alkaline Phos 44 - 121 IU/L 74     AST 0 - 40 IU/L 26     ALT 0 - 32 IU/L 29      Lipid Panel     Component Value Date/Time   CHOL 167 01/09/2022 0855   TRIG 192 (H) 01/09/2022 0855   HDL 45 01/09/2022 0855   CHOLHDL 3.7 01/09/2022 0855   CHOLHDL 5.1 (H) 08/14/2016 1729   VLDL 74 (H) 08/14/2016 1729   LDLCALC 89 01/09/2022 0855    CBC    Component Value Date/Time   WBC 5.2 06/26/2021 1504   RBC 4.44 06/26/2021 1504   HGB 13.2 06/26/2021 1504   HGB 13.3 02/03/2020 0837   HGB 13.6 12/04/2018 0914   HGB 12.9 04/22/2016 0750   HCT 41.2 06/26/2021 1504   HCT 41.0 12/04/2018 0914   HCT 39.0  04/22/2016 0750   PLT 309 06/26/2021 1504   PLT 270 02/03/2020 0837   PLT 332 12/04/2018 0914   MCV 92.8 06/26/2021 1504   MCV 88 12/04/2018 0914   MCV 92.2 04/22/2016 0750   MCH 29.7 06/26/2021 1504   MCHC 32.0 06/26/2021 1504   RDW 12.6 06/26/2021 1504   RDW 13.6 12/04/2018 0914   RDW 13.9 04/22/2016 0750   LYMPHSABS 2.6 02/03/2020 0837   LYMPHSABS 2.7 04/22/2016 0750   MONOABS 0.8 02/03/2020 0837   MONOABS 1.0 (H) 04/22/2016 0750   EOSABS 0.2 02/03/2020 0837   EOSABS 0.1 04/22/2016 0750   BASOSABS 0.0 02/03/2020 0837   BASOSABS 0.0 04/22/2016 0750    ASSESSMENT AND PLAN:  1. Type 2 diabetes mellitus with obesity (HCC) Doing well on Mounjaro, Lantus and metformin.  Encouraged her to continue healthy eating habits and regular exercise. - POCT glucose (manual entry)  2. Hypertension associated with diabetes ([place]) Controlled on hydralazine  25 mg 1-1/2 tablets 3 times a day, metoprolol 25 mg half a tablet twice a day, amlodipine 10 mg daily, Cozaar 50 mg daily  3. Hyperlipidemia associated with type 2 diabetes mellitus (HCC) Continue Crestor 20 mg daily  4. CKD stage 3 due to type 2 diabetes mellitus ([place]) Advised to drink several glasses of water daily.  We will continue to monitor kidney function  5. Hypercalcemia Advised to drink several glasses of water daily. I will submit referral to [facility] endocrinology to try to get her in with one of the other endocrinologist since [name] is no longer there. - Ambulatory referral to Endocrinology    Patient was given the opportunity to ask questions.  Patient verbalized understanding of the plan and was able to repeat key elements of the plan.   This documentation was completed using Radio producer.  Any transcriptional errors are unintentional.  Orders Placed This Encounter  Procedures   Ambulatory referral to Endocrinology   POCT glucose (manual entry)     Requested Prescriptions    No  prescriptions requested or ordered in this encounter    Return in about 4 months (around 05/13/2022).  Karle Plumber, MD, FACP

## 2022-01-13 NOTE — Progress Notes (Signed)
Chief Complaint:   OBESITY Sarah [name] is here to discuss her progress with her obesity treatment plan along with follow-up of her obesity related diagnoses. Raksha is on the Category 2 Plan with breakfast and lunch options and states she is following her eating plan approximately 70% of the time. Laney states she is doing Silver Sneakers 45 minutes 3 times per week.  Today's visit was #: 35 Starting weight: 228 lbs Starting date: 02/15/2020 Today's weight: 195 lbs Today's date: 01/09/2022 Total lbs lost to date: 33 Total lbs lost since last in-office visit: 3  Interim History: [name] is still skipping meals and not measuring her proteins. She also reports more stress.  Subjective:   1. Type 2 diabetes mellitus with other specified complication, without long-term current use of insulin (HCC) Pt's fasting blood sugars run in the 130's. Her lowest level was 115 and highest 138. Pt feels she needs Mounjaro at current dose to help keep her on track. Medication: Metformin, Mounjaro, Lantus 20 units  2. B12 deficiency [name] is taking OTC B12 500 mg daily. Her B12 6 months ago was 1389.  3. Vitamin D deficiency With supplementation, pt's calcium level went up and PCP discontinued Vit D in January 2023. Her calcium level has come back down.  Assessment/Plan:   Orders Placed This Encounter  Procedures   Comprehensive metabolic panel   Hemoglobin A1c   Lipid panel   Vitamin B12   Magnesium    There are no discontinued medications.   No orders of the defined types were placed in this encounter.    1. Type 2 diabetes mellitus with other specified complication, without long-term current use of insulin (HCC) Good blood sugar control is important to decrease the likelihood of diabetic complications such as nephropathy, neuropathy, limb loss, blindness, coronary artery disease, and death. Intensive lifestyle modification including diet, exercise and weight loss are the first line of  treatment for diabetes. Continue Metformin, Mounjaro, and Lantus.  Lab/Orders today: - Comprehensive metabolic panel - Hemoglobin A1c - Lipid panel - Magnesium  2. B12 deficiency The diagnosis was reviewed with the patient. Counseling provided today, see below. We will continue to monitor. Orders and follow up as documented in patient record. Continue supplementation.  Counseling The body needs vitamin B12: to make red blood cells; to make DNA; and to help the nerves work properly so they can carry messages from the brain to the body.  The main causes of vitamin B12 deficiency include dietary deficiency, digestive diseases, pernicious anemia, and having a surgery in which part of the stomach or small intestine is removed.  Certain medicines can make it harder for the body to absorb vitamin B12. These medicines include: heartburn medications; some antibiotics; some medications used to treat diabetes, gout, and high cholesterol.  In some cases, there are no symptoms of this condition. If the condition leads to anemia or nerve damage, various symptoms can occur, such as weakness or fatigue, shortness of breath, and numbness or tingling in your hands and feet.   Treatment:  May include taking vitamin B12 supplements.  Avoid alcohol.  Eat lots of healthy foods that contain vitamin B12: Beef, pork, chicken, [place], and organ meats, such as liver.  Seafood: This includes clams, rainbow trout, salmon, tuna, and haddock. Eggs.  Cereal and dairy products that are fortified: This means that vitamin B12 has been added to the food.   Lab/Orders today: - Vitamin B12  3. Vitamin D deficiency Low Vitamin D level  contributes to fatigue and are associated with obesity, breast, and colon cancer. She continue management of Vit D deficiency per PCP, endocrinology, and oncology.   4. Obesity, Current BMI 32.5 [name] is currently in the action stage of change. As such, her goal is to continue with weight  loss efforts. She has agreed to the Category 2 Plan with breakfast and lunch options.   Pt desires to increase exercise to help with stress 45 minutes 4 days a week.  Exercise goals:  As is  Behavioral modification strategies: planning for success.  [name] has agreed to follow-up with our clinic in 3 weeks, fasting for labs and repeat IC. She was informed of the importance of frequent follow-up visits to maximize her success with intensive lifestyle modifications for her multiple health conditions.   [name] was informed we would discuss her lab results at her next visit unless there is a critical issue that needs to be addressed sooner. [name] agreed to keep her next visit at the agreed upon time to discuss these results.  Objective:   Blood pressure 113/77, pulse 82, temperature 97.8 F (36.6 C), height '5\' 5"'$  (1.651 m), weight 195 lb (88.5 kg), SpO2 96 %. Body mass index is 32.45 kg/m.  General: Cooperative, alert, well developed, in no acute distress. HEENT: Conjunctivae and lids unremarkable. Cardiovascular: Regular rhythm.  Lungs: Normal work of breathing. Neurologic: No focal deficits.   Lab Results  Component Value Date   CREATININE 1.44 (H) 01/09/2022   BUN 18 01/09/2022   NA 142 01/09/2022   K 4.2 01/09/2022   CL 103 01/09/2022   CO2 23 01/09/2022   Lab Results  Component Value Date   ALT 29 01/09/2022   AST 26 01/09/2022   ALKPHOS 74 01/09/2022   BILITOT 0.5 01/09/2022   Lab Results  Component Value Date   HGBA1C 5.5 01/09/2022   HGBA1C 5.7 10/04/2021   HGBA1C 6.2 (H) 06/26/2021   HGBA1C 7.0 (H) 03/07/2021   HGBA1C 7.2 (H) 11/29/2020   No results found for: "INSULIN" Lab Results  Component Value Date   TSH 0.938 02/15/2020   Lab Results  Component Value Date   CHOL 167 01/09/2022   HDL 45 01/09/2022   LDLCALC 89 01/09/2022   TRIG 192 (H) 01/09/2022   CHOLHDL 3.7 01/09/2022   Lab Results  Component Value Date   VD25OH 35.7 10/22/2021   VD25OH  99.6 06/26/2021   VD25OH 81.1 03/07/2021   Lab Results  Component Value Date   WBC 5.2 06/26/2021   HGB 13.2 06/26/2021   HCT 41.2 06/26/2021   MCV 92.8 06/26/2021   PLT 309 06/26/2021   Lab Results  Component Value Date   IRON 43 08/18/2010   TIBC 389 08/18/2010   FERRITIN 227 08/18/2010    Obesity Behavioral Intervention:   Approximately 15 minutes were spent on the discussion below.  ASK: We discussed the diagnosis of obesity with [name] today and Lourene agreed to give [place] permission to discuss obesity behavioral modification therapy today.  ASSESS: Kariya has the diagnosis of obesity and her BMI today is 32.5. Lareina is in the action stage of change.   ADVISE: Yarima was educated on the multiple health risks of obesity as well as the benefit of weight loss to improve her health. She was advised of the need for long term treatment and the importance of lifestyle modifications to improve her current health and to decrease her risk of future health problems.  AGREE: Multiple dietary modification options  and treatment options were discussed and Tera agreed to follow the recommendations documented in the above note.  ARRANGE: Naela was educated on the importance of frequent visits to treat obesity as outlined per CMS and USPSTF guidelines and agreed to schedule her next follow up appointment today.  Attestation Statements:   Reviewed by clinician on day of visit: allergies, medications, problem list, medical history, surgical history, family history, social history, and previous encounter notes.  I, Kathlene November, BS, CMA, am acting as transcriptionist for [employer], DO.   I have reviewed the above documentation for accuracy and completeness, and I agree with the above. Marjory Sneddon, D.O.  The [place] was signed into law in 2016 which includes the topic of electronic health records.  This provides immediate access to information in MyChart.   This includes consultation notes, operative notes, office notes, lab results and pathology reports.  If you have any questions about what you read please let us know at your next visit so we can discuss your concerns and take corrective action if need be.  We are right here with you.

## 2022-01-16 ENCOUNTER — Other Ambulatory Visit: Payer: Self-pay | Admitting: Internal Medicine

## 2022-01-16 ENCOUNTER — Encounter (INDEPENDENT_AMBULATORY_CARE_PROVIDER_SITE_OTHER): Payer: Self-pay

## 2022-01-16 NOTE — Telephone Encounter (Signed)
Requested Prescriptions  Pending Prescriptions Disp Refills  . DROPLET PEN NEEDLES 29G X 12MM MISC [Pharmacy Med Name: DROPLET PEN NEEDLES 29GX12MM 29G X 12MM] 100 each 0    Sig: USE AS DIRECTED     Endocrinology: Diabetes - Testing Supplies Passed - 01/16/2022  2:44 AM      Passed - Valid encounter within last 12 months    Recent Outpatient Visits          5 days ago Type 2 diabetes mellitus with obesity ([place])   [place] Karle Plumber B, MD   3 months ago Type 2 diabetes mellitus with obesity [facility])   [place] Karle Plumber B, MD   7 months ago Type 2 diabetes mellitus with morbid obesity [facility])   [place], MD   11 months ago Type 2 diabetes mellitus with obesity [facility])   [place], MD   1 year ago Cutaneous abscess of back excluding buttocks   [place], Kriste Basque, NP      Future Appointments            In 4 months Wynetta Emery, Dalbert Batman, MD [place]

## 2022-01-26 ENCOUNTER — Other Ambulatory Visit: Payer: Self-pay | Admitting: Internal Medicine

## 2022-01-26 DIAGNOSIS — I10 Essential (primary) hypertension: Secondary | ICD-10-CM

## 2022-02-04 ENCOUNTER — Ambulatory Visit (INDEPENDENT_AMBULATORY_CARE_PROVIDER_SITE_OTHER): Payer: Medicare HMO | Admitting: Family Medicine

## 2022-02-04 ENCOUNTER — Encounter (INDEPENDENT_AMBULATORY_CARE_PROVIDER_SITE_OTHER): Payer: Self-pay | Admitting: Family Medicine

## 2022-02-04 VITALS — BP 125/80 | HR 78 | Temp 98.0°F | Ht 65.0 in | Wt 196.0 lb

## 2022-02-04 DIAGNOSIS — Z794 Long term (current) use of insulin: Secondary | ICD-10-CM | POA: Diagnosis not present

## 2022-02-04 DIAGNOSIS — E669 Obesity, unspecified: Secondary | ICD-10-CM

## 2022-02-04 DIAGNOSIS — N1832 Chronic kidney disease, stage 3b: Secondary | ICD-10-CM | POA: Diagnosis not present

## 2022-02-04 DIAGNOSIS — R0602 Shortness of breath: Secondary | ICD-10-CM | POA: Diagnosis not present

## 2022-02-04 DIAGNOSIS — E538 Deficiency of other specified B group vitamins: Secondary | ICD-10-CM | POA: Diagnosis not present

## 2022-02-04 DIAGNOSIS — J302 Other seasonal allergic rhinitis: Secondary | ICD-10-CM | POA: Diagnosis not present

## 2022-02-04 DIAGNOSIS — E559 Vitamin D deficiency, unspecified: Secondary | ICD-10-CM

## 2022-02-04 DIAGNOSIS — Z6832 Body mass index (BMI) 32.0-32.9, adult: Secondary | ICD-10-CM | POA: Diagnosis not present

## 2022-02-04 DIAGNOSIS — E1122 Type 2 diabetes mellitus with diabetic chronic kidney disease: Secondary | ICD-10-CM | POA: Diagnosis not present

## 2022-02-04 DIAGNOSIS — Z7984 Long term (current) use of oral hypoglycemic drugs: Secondary | ICD-10-CM

## 2022-02-04 MED ORDER — MONTELUKAST SODIUM 10 MG PO TABS: 10.0000 mg | ORAL_TABLET | Freq: Every day | ORAL | 0 refills | Status: DC

## 2022-02-04 MED ORDER — METFORMIN HCL 1000 MG PO TABS: 500.0000 mg | ORAL_TABLET | Freq: Two times a day (BID) | ORAL | 0 refills | Status: DC

## 2022-02-04 MED ORDER — LEVOCETIRIZINE DIHYDROCHLORIDE 5 MG PO TABS: 5.0000 mg | ORAL_TABLET | Freq: Every evening | ORAL | 0 refills | Status: DC

## 2022-02-05 ENCOUNTER — Encounter: Payer: Self-pay | Admitting: Internal Medicine

## 2022-02-05 ENCOUNTER — Encounter (INDEPENDENT_AMBULATORY_CARE_PROVIDER_SITE_OTHER): Payer: Self-pay | Admitting: Family Medicine

## 2022-02-11 ENCOUNTER — Encounter (INDEPENDENT_AMBULATORY_CARE_PROVIDER_SITE_OTHER): Payer: Self-pay | Admitting: Family Medicine

## 2022-02-11 NOTE — Progress Notes (Signed)
Chief Complaint:   OBESITY Sarah [name] is here to discuss her progress with her obesity treatment plan along with follow-up of her obesity related diagnoses. Sarah [name] is on the Category 2 Plan with breakfast and lunch options and states she is following her eating plan approximately 60% of the time. Sarah [name] states she is doing cardio and weight training 45 minutes 3 times per week.  Today's visit was #: 98 Starting weight: 228 lbs Starting date: 02/15/2020 Today's weight: 196 lbs Today's date: 02/04/2022 Total lbs lost to date: 32 Total lbs lost since last in-office visit: +1  Interim History: Sarah [name]'s mom turned 66 years old yesterday and they celebrated in a restaurant, therefore, pt did some celebration eating. She is not eating her foods as well, though, and she is skipping meals (occasionally breakfast). She is drinking 100 oz of water per day.  Subjective:   1. Type 2 diabetes mellitus with stage 3b chronic kidney disease, with long-term current use of insulin ([place]) Discussed labs with patient today. Sarah [name]'s serum creatinine is elevated with  GFR decreasing to 43. Her A1c is 5.5 and down from 6.2 about 6 months ago. Pt's LDL is above goal a tad at 89. Medication: 20 units Lantus; Metformin 1000 BID; Mounjaro  2. B12 deficiency Discussed labs with patient today. Pt reports energy levels are good.  3. Hypomagnesemia Discussed labs with patient today. She reports muscle cramps in her legs/calves lately and denies constipation.  4. SOB (shortness of breath) on exertion Discussed labs with patient today. Sarah [name] notes increasing shortness of breath with exercising and seems to be worsening over time with weight gain. She notes getting out of breath sooner with activity than she used to. This has gotten worse recently. Sarah [name] denies shortness of breath at rest or orthopnea.  5. Seasonal allergies Sarah [name] reports itchy eyes.  Assessment/Plan:  No orders of the defined types were  placed in this encounter.   Medications Discontinued During This Encounter  Medication Reason   metFORMIN (GLUCOPHAGE) 1000 MG tablet    levocetirizine (XYZAL) 5 MG tablet Reorder   montelukast (SINGULAIR) 10 MG tablet Reorder     Meds ordered this encounter  Medications   metFORMIN (GLUCOPHAGE) 1000 MG tablet    Sig: Take 0.5 tablets (500 mg total) by mouth 2 (two) times daily with a meal.    Dispense:  30 tablet    Refill:  0   montelukast (SINGULAIR) 10 MG tablet    Sig: Take 1 tablet (10 mg total) by mouth at bedtime.    Dispense:  30 tablet    Refill:  0   levocetirizine (XYZAL) 5 MG tablet    Sig: Take 1 tablet (5 mg total) by mouth every evening.    Dispense:  30 tablet    Refill:  0     1. Type 2 diabetes mellitus with stage 3b chronic kidney disease, with long-term current use of insulin (HCC) Good blood sugar control is important to decrease the likelihood of diabetic complications such as nephropathy, neuropathy, limb loss, blindness, coronary artery disease, and death. Intensive lifestyle modification including diet, exercise and weight loss are the first line of treatment for diabetes.  Worsening GFR with a great A1c at 5.5. I recommend pt decrease Metformin to 500 mg BID from 1000 mg. Pt encouraged to discuss with PCP regarding the decrease in Metformin dose.  Decrease dose & Refill- metFORMIN (GLUCOPHAGE) 1000 MG tablet; Take 0.5 tablets (500 mg total) by mouth 2 (two) times  daily with a meal.  Dispense: 30 tablet; Refill: 0  2. B12 deficiency B12 at goal now at 980. The diagnosis was reviewed with the patient. Counseling provided today, see below. We will continue to monitor. Orders and follow up as documented in patient record. Continue OTC B12 500 mcg daily.  Counseling The body needs vitamin B12: to make red blood cells; to make DNA; and to help the nerves work properly so they can carry messages from the brain to the body.  The main causes of vitamin B12  deficiency include dietary deficiency, digestive diseases, pernicious anemia, and having a surgery in which part of the stomach or small intestine is removed.  Certain medicines can make it harder for the body to absorb vitamin B12. These medicines include: heartburn medications; some antibiotics; some medications used to treat diabetes, gout, and high cholesterol.  In some cases, there are no symptoms of this condition. If the condition leads to anemia or nerve damage, various symptoms can occur, such as weakness or fatigue, shortness of breath, and numbness or tingling in your hands and feet.   Treatment:  May include taking vitamin B12 supplements.  Avoid alcohol.  Eat lots of healthy foods that contain vitamin B12: Beef, pork, chicken, [place], and organ meats, such as liver.  Seafood: This includes clams, rainbow trout, salmon, tuna, and haddock. Eggs.  Cereal and dairy products that are fortified: This means that vitamin B12 has been added to the food.   3. Hypomagnesemia Magnesium is low at 1.5. Pt prefers liquid supplement of 325-650 mg daily. Recheck level in 3 months.  4. SOB (shortness of breath) on exertion Sarah [name] does feel that she gets out of breath more easily that she used to when she exercises. Sarah [name]'s shortness of breath appears to be obesity related and exercise induced. She has agreed to work on weight loss and gradually increase exercise to treat her exercise induced shortness of breath. Will continue to monitor closely. Repeat IC today showed measured drop from 01/2021 of 1757 to today 1440. Pt is 33 lbs less and we would anticipate this. Change from category 2 to category 1.  5. Seasonal allergies Continue Xyzal and Singulair. Start OTC Naphcon A.  Refill- montelukast (SINGULAIR) 10 MG tablet; Take 1 tablet (10 mg total) by mouth at bedtime.  Dispense: 30 tablet; Refill: 0 Refill- levocetirizine (XYZAL) 5 MG tablet; Take 1 tablet (5 mg total) by mouth every evening.   Dispense: 30 tablet; Refill: 0  6. Obesity, Current BMI 32.6 Sarah [name] is currently in the action stage of change. As such, her goal is to continue with weight loss efforts. She has agreed to change to the Category 1 Plan.   Cut Metformin dose in half, and ween insulin if fasting blood sugar is less than 100 on a regular - cut insulin by 2 units.  Exercise goals:  As is  Behavioral modification strategies: increasing lean protein intake, decreasing simple carbohydrates, avoiding temptations, and planning for success.  Sarah [name] has agreed to follow-up with our clinic in 3 weeks. She was informed of the importance of frequent follow-up visits to maximize her success with intensive lifestyle modifications for her multiple health conditions.   Objective:   Blood pressure 125/80, pulse 78, temperature 98 F (36.7 C), height '5\' 5"'$  (1.651 m), weight 196 lb (88.9 kg), SpO2 100 %. Body mass index is 32.62 kg/m.  General: Cooperative, alert, well developed, in no acute distress. HEENT: Conjunctivae and lids unremarkable. Cardiovascular: Regular rhythm.  Lungs:  Normal work of breathing. Neurologic: No focal deficits.   Lab Results  Component Value Date   CREATININE 1.44 (H) 01/09/2022   BUN 18 01/09/2022   NA 142 01/09/2022   K 4.2 01/09/2022   CL 103 01/09/2022   CO2 23 01/09/2022   Lab Results  Component Value Date   ALT 29 01/09/2022   AST 26 01/09/2022   ALKPHOS 74 01/09/2022   BILITOT 0.5 01/09/2022   Lab Results  Component Value Date   HGBA1C 5.5 01/09/2022   HGBA1C 5.7 10/04/2021   HGBA1C 6.2 (H) 06/26/2021   HGBA1C 7.0 (H) 03/07/2021   HGBA1C 7.2 (H) 11/29/2020   No results found for: "INSULIN" Lab Results  Component Value Date   TSH 0.938 02/15/2020   Lab Results  Component Value Date   CHOL 167 01/09/2022   HDL 45 01/09/2022   LDLCALC 89 01/09/2022   TRIG 192 (H) 01/09/2022   CHOLHDL 3.7 01/09/2022   Lab Results  Component Value Date   VD25OH 35.7 10/22/2021    VD25OH 99.6 06/26/2021   VD25OH 81.1 03/07/2021   Lab Results  Component Value Date   WBC 5.2 06/26/2021   HGB 13.2 06/26/2021   HCT 41.2 06/26/2021   MCV 92.8 06/26/2021   PLT 309 06/26/2021   Lab Results  Component Value Date   IRON 43 08/18/2010   TIBC 389 08/18/2010   FERRITIN 227 08/18/2010    Attestation Statements:   Reviewed by clinician on day of visit: allergies, medications, problem list, medical history, surgical history, family history, social history, and previous encounter notes.  Time spent on visit including pre-visit chart review and post-visit care and charting was 40 minutes.   I, Kathlene November, BS, CMA, am acting as transcriptionist for [employer], DO.  I have reviewed the above documentation for accuracy and completeness, and I agree with the above. Marjory Sneddon, D.O.  The [place] was signed into law in 2016 which includes the topic of electronic health records.  This provides immediate access to information in MyChart.  This includes consultation notes, operative notes, office notes, lab results and pathology reports.  If you have any questions about what you read please let us know at your next visit so we can discuss your concerns and take corrective action if need be.  We are right here with you.

## 2022-02-12 ENCOUNTER — Other Ambulatory Visit (INDEPENDENT_AMBULATORY_CARE_PROVIDER_SITE_OTHER): Payer: Self-pay

## 2022-02-12 DIAGNOSIS — Z794 Long term (current) use of insulin: Secondary | ICD-10-CM

## 2022-02-12 MED ORDER — TIRZEPATIDE 10 MG/0.5ML ~~LOC~~ SOAJ
10.0000 mg | SUBCUTANEOUS | 0 refills | Status: DC
  Filled 2022-02-12: qty 2, 28d supply, fill #0

## 2022-02-13 ENCOUNTER — Other Ambulatory Visit (HOSPITAL_COMMUNITY): Payer: Self-pay

## 2022-02-14 ENCOUNTER — Other Ambulatory Visit (HOSPITAL_COMMUNITY): Payer: Self-pay

## 2022-02-25 ENCOUNTER — Encounter (INDEPENDENT_AMBULATORY_CARE_PROVIDER_SITE_OTHER): Payer: Self-pay | Admitting: Family Medicine

## 2022-02-25 ENCOUNTER — Ambulatory Visit (INDEPENDENT_AMBULATORY_CARE_PROVIDER_SITE_OTHER): Payer: Medicare HMO | Admitting: Family Medicine

## 2022-02-25 VITALS — BP 113/77 | HR 68 | Temp 97.6°F | Ht 65.0 in | Wt 192.0 lb

## 2022-02-25 DIAGNOSIS — F39 Unspecified mood [affective] disorder: Secondary | ICD-10-CM

## 2022-02-25 DIAGNOSIS — E669 Obesity, unspecified: Secondary | ICD-10-CM | POA: Diagnosis not present

## 2022-02-25 DIAGNOSIS — Z6832 Body mass index (BMI) 32.0-32.9, adult: Secondary | ICD-10-CM | POA: Diagnosis not present

## 2022-02-25 DIAGNOSIS — J302 Other seasonal allergic rhinitis: Secondary | ICD-10-CM

## 2022-02-25 MED ORDER — MONTELUKAST SODIUM 10 MG PO TABS: 10.0000 mg | ORAL_TABLET | Freq: Every day | ORAL | 0 refills | Status: DC

## 2022-02-25 MED ORDER — LEVOCETIRIZINE DIHYDROCHLORIDE 5 MG PO TABS: 5.0000 mg | ORAL_TABLET | Freq: Every evening | ORAL | 0 refills | Status: DC

## 2022-02-25 MED ORDER — BUPROPION HCL ER (SR) 150 MG PO TB12: 150.0000 mg | ORAL_TABLET | Freq: Two times a day (BID) | ORAL | 0 refills | Status: DC

## 2022-03-04 NOTE — Progress Notes (Unsigned)
Chief Complaint:   OBESITY Sarah [name] is here to discuss her progress with her obesity treatment plan along with follow-up of her obesity related diagnoses. [name] is on the Category 1 Plan with breakfast and lunch options and states she is following her eating plan approximately 50% of the time. [name] states she is doing Silver Sneakers 45 minutes 3 times per week.  Today's visit was #: 86 Starting weight: 228 lbs Starting date: 02/15/2020 Today's weight: 192 lbs Today's date: 02/25/2022 Total lbs lost to date: 36 Total lbs lost since last in-office visit: 4  Interim History: Sarah [name] is skipping meals. She is here with her mom, Sarah [name], who is 56 years old, very fit, and competent! Sarah [name] says pt often doesn't eat. Pt did well and has increased her muscle mass by 5.2 lbs and decreased fat mass by 3.2 lbs.   Subjective:   1. Mood disorder ([place]) with emotional eating Sarah [name] is tolerating medication(s) well without side effects.  Medication compliance is good as patient endorses taking it as prescribed.  The patient denies additional concerns regarding this condition.     Pt's mood is very well controlled, and she denies issues.  2. Seasonal allergies ADDALEE [name] has seasonal allergies that have been more bothersome with the change of weather/season. Pt takes Singulair and Xyzal, which has improved symptoms control and her quality of life.  Tolerating well, denies concerns. Refill as below.   Assessment/Plan:  No orders of the defined types were placed in this encounter.   Medications Discontinued During This Encounter  Medication Reason   buPROPion (WELLBUTRIN SR) 150 MG 12 hr tablet Reorder   montelukast (SINGULAIR) 10 MG tablet Reorder   levocetirizine (XYZAL) 5 MG tablet Reorder     Meds ordered this encounter  Medications   buPROPion (WELLBUTRIN SR) 150 MG 12 hr tablet    Sig: Take 1 tablet (150 mg total) by mouth 2 (two) times daily.    Dispense:  60 tablet     Refill:  0   montelukast (SINGULAIR) 10 MG tablet    Sig: Take 1 tablet (10 mg total) by mouth at bedtime.    Dispense:  30 tablet    Refill:  0   levocetirizine (XYZAL) 5 MG tablet    Sig: Take 1 tablet (5 mg total) by mouth every evening.    Dispense:  30 tablet    Refill:  0     1. Mood disorder ([name]) with emotional eating Discussed how thoughts affect eating habits, modeling of thoughts, feelings, and behaviors, and strategies for change.  Importance of not skipping meals and getting all her proteins and fiber in on a daily basis discussed.   - Discussed cognitive distortions, coping thoughts, and how to change our thoughts/ self talk regarding foods/ eating patterns. - No SI/ HI.  Mood stable currently - Behavior modification techniques were discussed today to help deal with emotional/ non-hunger eating behaviors including but not limited to exercise for stress management, meditation/prayer, behavorial sessions with her therapist and self care activities like adequate sleep (7-9 hrs/nite). - Importance of following up with PCP and others was stressed  - Educational handouts given to pt at their request - Reminded patient of the importance of following their prudent nutrition plan and how food can affect mood as well to support emotional wellbeing.  - We will continue to monitor closely alongside PCP / other specialists.  Refill- buPROPion (WELLBUTRIN SR) 150 MG 12 hr tablet; Take  1 tablet (150 mg total) by mouth 2 (two) times daily.  Dispense: 60 tablet; Refill: 0  2. Seasonal allergies  Plan:  - Seasonal and environmental allergies and pathophysiology of disease process discussed with patient.  - Preventative strategies as first line for management discussed.  I encouraged use of KN or N-95 mask prn as well as sterile saline rinses such as Milta Deiters Med or AYR sinus rinses to be done once- twice daily and after any prolonged exposure to the environment or allergen.    It is best to use  distilled water or previously boiled water    - If eyes are itchy or irritated feeling when your seasonal allergies get bad, ok to use Naphcon-A over-the-counter eyedrops as needed - Encouraged to return to clinic or call the office for any further questions or concerns they may have. Refill- montelukast (SINGULAIR) 10 MG tablet; Take 1 tablet (10 mg total) by mouth at bedtime.  Dispense: 30 tablet; Refill: 0 Refill- levocetirizine (XYZAL) 5 MG tablet; Take 1 tablet (5 mg total) by mouth every evening.  Dispense: 30 tablet; Refill: 0  3. Obesity, Current BMI 32 Royalty is currently in the action stage of change. As such, her goal is to continue with weight loss efforts. She has agreed to the Category 1 Plan with breakfast and lunch options.   Eat all foods on plan. No Skipping. Measure proteins.  Exercise goals:  Start increasing exercise other days of the week and walk outside with mom.   Behavioral modification strategies: no skipping meals, meal planning and cooking strategies, and planning for success.  [name] has agreed to follow-up with our clinic in 2-3 weeks. She was informed of the importance of frequent follow-up visits to maximize her success with intensive lifestyle modifications for her multiple health conditions.   Objective:   Blood pressure 113/77, pulse 68, temperature 97.6 F (36.4 C), height '5\' 5"'$  (1.651 m), weight 192 lb (87.1 kg), SpO2 100 %. Body mass index is 31.95 kg/m.  General: Cooperative, alert, well developed, in no acute distress. HEENT: Conjunctivae and lids unremarkable. Cardiovascular: Regular rhythm.  Lungs: Normal work of breathing. Neurologic: No focal deficits.   Lab Results  Component Value Date   CREATININE 1.44 (H) 01/09/2022   BUN 18 01/09/2022   NA 142 01/09/2022   K 4.2 01/09/2022   CL 103 01/09/2022   CO2 23 01/09/2022   Lab Results  Component Value Date   ALT 29 01/09/2022   AST 26 01/09/2022   ALKPHOS 74 01/09/2022   BILITOT  0.5 01/09/2022   Lab Results  Component Value Date   HGBA1C 5.5 01/09/2022   HGBA1C 5.7 10/04/2021   HGBA1C 6.2 (H) 06/26/2021   HGBA1C 7.0 (H) 03/07/2021   HGBA1C 7.2 (H) 11/29/2020   No results found for: "INSULIN" Lab Results  Component Value Date   TSH 0.938 02/15/2020   Lab Results  Component Value Date   CHOL 167 01/09/2022   HDL 45 01/09/2022   LDLCALC 89 01/09/2022   TRIG 192 (H) 01/09/2022   CHOLHDL 3.7 01/09/2022   Lab Results  Component Value Date   VD25OH 35.7 10/22/2021   VD25OH 99.6 06/26/2021   VD25OH 81.1 03/07/2021   Lab Results  Component Value Date   WBC 5.2 06/26/2021   HGB 13.2 06/26/2021   HCT 41.2 06/26/2021   MCV 92.8 06/26/2021   PLT 309 06/26/2021   Lab Results  Component Value Date   IRON 43 08/18/2010   TIBC 389  08/18/2010   FERRITIN 227 08/18/2010    Obesity Behavioral Intervention:   Approximately 15 minutes were spent on the discussion below.  ASK: We discussed the diagnosis of obesity with [name] today and Khayla agreed to give [place] permission to discuss obesity behavioral modification therapy today.  ASSESS: Dorthea has the diagnosis of obesity and her BMI today is 32.0. Antasia is in the action stage of change.   ADVISE: Cricket was educated on the multiple health risks of obesity as well as the benefit of weight loss to improve her health. She was advised of the need for long term treatment and the importance of lifestyle modifications to improve her current health and to decrease her risk of future health problems.  AGREE: Multiple dietary modification options and treatment options were discussed and Harlee agreed to follow the recommendations documented in the above note.  ARRANGE: Avilene was educated on the importance of frequent visits to treat obesity as outlined per CMS and USPSTF guidelines and agreed to schedule her next follow up appointment today.  Attestation Statements:   Reviewed by clinician on day of visit:  allergies, medications, problem list, medical history, surgical history, family history, social history, and previous encounter notes.  I, Kathlene November, BS, CMA, am acting as transcriptionist for [employer], DO.   I have reviewed the above documentation for accuracy and completeness, and I agree with the above. Marjory Sneddon, D.O.  The [place] was signed into law in 2016 which includes the topic of electronic health records.  This provides immediate access to information in MyChart.  This includes consultation notes, operative notes, office notes, lab results and pathology reports.  If you have any questions about what you read please let us know at your next visit so we can discuss your concerns and take corrective action if need be.  We are right here with you.

## 2022-03-14 ENCOUNTER — Other Ambulatory Visit (HOSPITAL_COMMUNITY): Payer: Self-pay

## 2022-03-14 ENCOUNTER — Ambulatory Visit (INDEPENDENT_AMBULATORY_CARE_PROVIDER_SITE_OTHER): Payer: Medicare HMO | Admitting: Family Medicine

## 2022-03-14 ENCOUNTER — Encounter (INDEPENDENT_AMBULATORY_CARE_PROVIDER_SITE_OTHER): Payer: Self-pay | Admitting: Family Medicine

## 2022-03-14 VITALS — BP 130/87 | HR 72 | Temp 98.0°F | Ht 65.0 in | Wt 201.0 lb

## 2022-03-14 DIAGNOSIS — Z6833 Body mass index (BMI) 33.0-33.9, adult: Secondary | ICD-10-CM | POA: Diagnosis not present

## 2022-03-14 DIAGNOSIS — E669 Obesity, unspecified: Secondary | ICD-10-CM | POA: Diagnosis not present

## 2022-03-14 DIAGNOSIS — E1169 Type 2 diabetes mellitus with other specified complication: Secondary | ICD-10-CM

## 2022-03-14 DIAGNOSIS — Z7984 Long term (current) use of oral hypoglycemic drugs: Secondary | ICD-10-CM | POA: Diagnosis not present

## 2022-03-14 DIAGNOSIS — Z7985 Long-term (current) use of injectable non-insulin antidiabetic drugs: Secondary | ICD-10-CM

## 2022-03-14 DIAGNOSIS — Z794 Long term (current) use of insulin: Secondary | ICD-10-CM

## 2022-03-14 DIAGNOSIS — J302 Other seasonal allergic rhinitis: Secondary | ICD-10-CM | POA: Diagnosis not present

## 2022-03-14 MED ORDER — METFORMIN HCL 500 MG PO TABS: 500.0000 mg | ORAL_TABLET | Freq: Two times a day (BID) | ORAL | 0 refills | Status: DC

## 2022-03-14 MED ORDER — LEVOCETIRIZINE DIHYDROCHLORIDE 5 MG PO TABS: 5.0000 mg | ORAL_TABLET | Freq: Every evening | ORAL | 0 refills | Status: DC

## 2022-03-14 MED ORDER — TIRZEPATIDE 12.5 MG/0.5ML ~~LOC~~ SOAJ
12.5000 mg | SUBCUTANEOUS | 0 refills | Status: DC
  Filled 2022-03-14: qty 2, 28d supply, fill #0

## 2022-03-14 MED ORDER — MONTELUKAST SODIUM 10 MG PO TABS: 10.0000 mg | ORAL_TABLET | Freq: Every day | ORAL | 0 refills | Status: DC

## 2022-03-15 ENCOUNTER — Other Ambulatory Visit (HOSPITAL_COMMUNITY): Payer: Self-pay

## 2022-03-26 ENCOUNTER — Encounter: Payer: Self-pay | Admitting: Internal Medicine

## 2022-03-27 ENCOUNTER — Ambulatory Visit: Payer: Self-pay | Admitting: *Deleted

## 2022-03-27 NOTE — Telephone Encounter (Signed)
  Chief Complaint: Abdominal Pain Symptoms: 9/10 abdominal pain, right sided, radiates to lower back. Comes and goes, some days constant. Worsening "Day by day." Frequency: Last Thursday Pertinent Negatives: Patient denies N/V/D fever Disposition: '[x]'$ ED /'[]'$ Urgent Care (no appt availability in office) / '[]'$ Appointment(In office/virtual)/ '[]'$  [facility]/ '[]'$ Home Care/ '[]'$ Refused Recommended Disposition /'[]'$  Mobile Bus/ '[]'$  Follow-up with PCP Additional Notes: Advised ED. Care advise provided, pt verbalizes understanding. Reason for Disposition  [1] SEVERE pain (e.g., excruciating) AND [2] present > 1 hour  Answer Assessment - Initial Assessment Questions 1. LOCATION: "Where does it hurt?"      Right sided  2. RADIATION: "Does the pain shoot anywhere else?" (e.g., chest, back)     " A little to lower back." 3. ONSET: "When did the pain begin?" (e.g., minutes, hours or days ago)      Past Thursday 4. SUDDEN: "Gradual or sudden onset?"     Suddenly 5. PATTERN "Does the pain come and go, or is it constant?"    - If it comes and goes: "How long does it last?" "Do you have pain now?"     (Note: Comes and goes means the pain is intermittent. It goes away completely between bouts.)    - If constant: "Is it getting better, staying the same, or getting worse?"      (Note: Constant means the pain never goes away completely; most serious pain is constant and gets worse.)      Comes and goes 6. SEVERITY: "How bad is the pain?"  (e.g., Scale 1-10; mild, moderate, or severe)    - MILD (1-3): Doesn't interfere with normal activities, abdomen soft and not tender to touch.     - MODERATE (4-7): Interferes with normal activities or awakens from sleep, abdomen tender to touch.     - SEVERE (8-10): Excruciating pain, doubled over, unable to do any normal activities.       9/10 7. RECURRENT SYMPTOM: "Have you ever had this type of stomach pain before?" If Yes, ask: "When was the last time?"  and "What happened that time?"       8. CAUSE: "What do you think is causing the stomach pain?"     Unsure 9. RELIEVING/AGGRAVATING FACTORS: "What makes it better or worse?" (e.g., antacids, bending or twisting motion, bowel movement)      10. OTHER SYMPTOMS: "Do you have any other symptoms?" (e.g., back pain, diarrhea, fever, urination pain, vomiting)       no  Protocols used: Abdominal Pain - Female-A-AH

## 2022-03-28 ENCOUNTER — Other Ambulatory Visit (HOSPITAL_BASED_OUTPATIENT_CLINIC_OR_DEPARTMENT_OTHER): Payer: Self-pay

## 2022-03-28 ENCOUNTER — Encounter (HOSPITAL_BASED_OUTPATIENT_CLINIC_OR_DEPARTMENT_OTHER): Payer: Self-pay | Admitting: Emergency Medicine

## 2022-03-28 ENCOUNTER — Emergency Department (HOSPITAL_BASED_OUTPATIENT_CLINIC_OR_DEPARTMENT_OTHER)
Admission: EM | Admit: 2022-03-28 | Discharge: 2022-03-28 | Disposition: A | Discharge status: Home / Self Care | Payer: Medicare HMO | Attending: Emergency Medicine | Admitting: Emergency Medicine

## 2022-03-28 ENCOUNTER — Other Ambulatory Visit: Payer: Self-pay

## 2022-03-28 ENCOUNTER — Emergency Department (HOSPITAL_BASED_OUTPATIENT_CLINIC_OR_DEPARTMENT_OTHER): Payer: Medicare HMO

## 2022-03-28 DIAGNOSIS — Z794 Long term (current) use of insulin: Secondary | ICD-10-CM | POA: Insufficient documentation

## 2022-03-28 DIAGNOSIS — M546 Pain in thoracic spine: Secondary | ICD-10-CM | POA: Diagnosis not present

## 2022-03-28 DIAGNOSIS — Z8543 Personal history of malignant neoplasm of ovary: Secondary | ICD-10-CM | POA: Insufficient documentation

## 2022-03-28 DIAGNOSIS — I10 Essential (primary) hypertension: Secondary | ICD-10-CM | POA: Insufficient documentation

## 2022-03-28 DIAGNOSIS — Z7984 Long term (current) use of oral hypoglycemic drugs: Secondary | ICD-10-CM | POA: Diagnosis not present

## 2022-03-28 DIAGNOSIS — E119 Type 2 diabetes mellitus without complications: Secondary | ICD-10-CM | POA: Diagnosis not present

## 2022-03-28 DIAGNOSIS — R079 Chest pain, unspecified: Secondary | ICD-10-CM | POA: Diagnosis not present

## 2022-03-28 DIAGNOSIS — Z79899 Other long term (current) drug therapy: Secondary | ICD-10-CM | POA: Diagnosis not present

## 2022-03-28 DIAGNOSIS — M545 Low back pain, unspecified: Secondary | ICD-10-CM | POA: Diagnosis not present

## 2022-03-28 LAB — CBC WITH DIFFERENTIAL/PLATELET
Abs Immature Granulocytes: 0.02 10*3/uL (ref 0.00–0.07)
Basophils Absolute: 0 10*3/uL (ref 0.0–0.1)
Basophils Relative: 0 %
Eosinophils Absolute: 0.1 10*3/uL (ref 0.0–0.5)
Eosinophils Relative: 2 %
HCT: 46.8 % — ABNORMAL HIGH (ref 36.0–46.0)
Hemoglobin: 15 g/dL (ref 12.0–15.0)
Immature Granulocytes: 0 %
Lymphocytes Relative: 46 %
Lymphs Abs: 2.4 10*3/uL (ref 0.7–4.0)
MCH: 30.2 pg (ref 26.0–34.0)
MCHC: 32.1 g/dL (ref 30.0–36.0)
MCV: 94.2 fL (ref 80.0–100.0)
Monocytes Absolute: 0.8 10*3/uL (ref 0.1–1.0)
Monocytes Relative: 14 %
Neutro Abs: 2 10*3/uL (ref 1.7–7.7)
Neutrophils Relative %: 38 %
Platelets: 318 10*3/uL (ref 150–400)
RBC: 4.97 MIL/uL (ref 3.87–5.11)
RDW: 13.6 % (ref 11.5–15.5)
WBC: 5.4 10*3/uL (ref 4.0–10.5)
nRBC: 0 % (ref 0.0–0.2)

## 2022-03-28 LAB — D-DIMER, QUANTITATIVE: D-Dimer, Quant: 0.27 ug/mL-FEU (ref 0.00–0.50)

## 2022-03-28 LAB — BASIC METABOLIC PANEL
Anion gap: 7 (ref 5–15)
BUN: 19 mg/dL (ref 6–20)
CO2: 26 mmol/L (ref 22–32)
Calcium: 11.3 mg/dL — ABNORMAL HIGH (ref 8.9–10.3)
Chloride: 106 mmol/L (ref 98–111)
Creatinine, Ser: 1.32 mg/dL — ABNORMAL HIGH (ref 0.44–1.00)
GFR, Estimated: 47 mL/min — ABNORMAL LOW (ref >60)
Glucose, Bld: 81 mg/dL (ref 70–99)
Potassium: 3.7 mmol/L (ref 3.5–5.1)
Sodium: 139 mmol/L (ref 135–145)

## 2022-03-28 LAB — TROPONIN I (HIGH SENSITIVITY): Troponin I (High Sensitivity): 7 ng/L (ref <18)

## 2022-03-28 MED ORDER — METHOCARBAMOL 500 MG PO TABS
500.0000 mg | ORAL_TABLET | Freq: Two times a day (BID) | ORAL | 0 refills | Status: DC
  Filled 2022-03-28: qty 20, 10d supply, fill #0

## 2022-03-28 MED ORDER — METHOCARBAMOL 500 MG PO TABS
500.0000 mg | ORAL_TABLET | Freq: Once | ORAL | Status: AC
  Administered 2022-03-28: 500 mg via ORAL
  Filled 2022-03-28: qty 1

## 2022-03-28 MED ORDER — ACETAMINOPHEN 500 MG PO TABS
1000.0000 mg | ORAL_TABLET | Freq: Once | ORAL | Status: AC
  Administered 2022-03-28: 1000 mg via ORAL
  Filled 2022-03-28: qty 2

## 2022-03-28 NOTE — ED Notes (Signed)
Presents with RUQ abd pain since Thursday. Denies N/V or D. States able to taken in POs without issue. States her pain is worse when taking deep breath.

## 2022-03-28 NOTE — Telephone Encounter (Signed)
Noted that patient is currently in hospital

## 2022-03-28 NOTE — Discharge Instructions (Addendum)
Your evaluation today was reassuring, does not suggest acute problem with your heart or lungs, no signs of blood clot or pneumonia.  Suspect muscle spasm or musculoskeletal pain.  Take Tylenol 650 mg every 6 hours as needed and you can use prescribed Robaxin which is a muscle relaxer to help with pain and spasm.  This medication can cause some drowsiness, do not take before driving.  You can also use ice and heat, or over-the-counter muscle rubs to help with pain.  If pain is continuing through the rest of the week please follow-up with your primary care doctor next week for further evaluation.

## 2022-03-28 NOTE — ED Triage Notes (Signed)
Right lower back radiating to right buttocks , worse in laying position , no Hx sciatica

## 2022-03-29 ENCOUNTER — Telehealth: Payer: Self-pay | Admitting: Licensed Clinical Social Worker

## 2022-04-01 ENCOUNTER — Ambulatory Visit (INDEPENDENT_AMBULATORY_CARE_PROVIDER_SITE_OTHER): Payer: Medicare HMO | Admitting: Family Medicine

## 2022-04-01 ENCOUNTER — Encounter (INDEPENDENT_AMBULATORY_CARE_PROVIDER_SITE_OTHER): Payer: Self-pay | Admitting: Family Medicine

## 2022-04-01 VITALS — BP 121/84 | HR 79 | Temp 97.6°F | Ht 65.0 in | Wt 198.0 lb

## 2022-04-01 DIAGNOSIS — Z7985 Long-term (current) use of injectable non-insulin antidiabetic drugs: Secondary | ICD-10-CM | POA: Diagnosis not present

## 2022-04-01 DIAGNOSIS — E669 Obesity, unspecified: Secondary | ICD-10-CM

## 2022-04-01 DIAGNOSIS — F39 Unspecified mood [affective] disorder: Secondary | ICD-10-CM | POA: Diagnosis not present

## 2022-04-01 DIAGNOSIS — N183 Chronic kidney disease, stage 3 unspecified: Secondary | ICD-10-CM | POA: Diagnosis not present

## 2022-04-01 DIAGNOSIS — Z6833 Body mass index (BMI) 33.0-33.9, adult: Secondary | ICD-10-CM

## 2022-04-01 DIAGNOSIS — E1169 Type 2 diabetes mellitus with other specified complication: Secondary | ICD-10-CM

## 2022-04-01 MED ORDER — TIRZEPATIDE 12.5 MG/0.5ML ~~LOC~~ SOAJ: 12.5000 mg | SUBCUTANEOUS | 0 refills | Status: DC

## 2022-04-01 MED ORDER — BUPROPION HCL ER (SR) 150 MG PO TB12: 150.0000 mg | ORAL_TABLET | Freq: Two times a day (BID) | ORAL | 0 refills | Status: DC

## 2022-04-02 NOTE — Patient Instructions (Signed)
Visit Information  Thank you for taking time to visit with me today. Please don't hesitate to contact me if I can be of assistance to you.   Following are the goals we discussed today:   Goals Addressed             This Visit's Progress    COMPLETED: Care Coordination Activities-No Follow Up Required       Care Coordination Interventions: Active listening / Reflection utilized  Pt reports that she was encouraged to f/up with PCP due to elevated kidney levels and arthritis in back. Pt was prescribed a muscle relaxer, to assist with pain management LCSW contacted [facility] to contact office and/or pt to schedule a hospital f/up appt. Pt was not admitted; therefore, [place] representative will obtain clarity whether Hospital f/up appt is needed, at this time. Pt has an upcoming appt with PCP scheduled for 05/17/22 LCSW informed patient of care coordination services. Pt is not interested at this time and agreed to contact PCP, should needs arise         If you are experiencing a Mental Health or [place] or need someone to talk to, please call the Suicide and Crisis Lifeline: 988 call 911   Patient verbalizes understanding of instructions and care plan provided today and agrees to view in [place]. Active MyChart status and patient understanding of how to access instructions and care plan via MyChart confirmed with patient.     No further follow up required:    Sarah [name], MSW, [name] Phone [phone] 10:06 AM

## 2022-04-02 NOTE — Patient Outreach (Signed)
  Care Coordination   Initial Visit Note   04/02/2022 Name: Sarah [name] MRN: [id number] DOB: [birth year]  Sarah [name] is a 56 y.o. year old female who sees Ladell Pier, MD for primary care. I spoke with  [name] by phone today.  What matters to the patients health and wellness today?  Care Coordination    Goals Addressed             This Visit's Progress    COMPLETED: Care Coordination Activities-No Follow Up Required       Care Coordination Interventions: Active listening / Reflection utilized  Pt reports that she was encouraged to f/up with PCP due to elevated kidney levels and arthritis in back. Pt was prescribed a muscle relaxer, to assist with pain management LCSW contacted [facility] to contact office and/or pt to schedule a hospital f/up appt. Pt was not admitted; therefore, [facility] representative will obtain clarity whether Hospital f/up appt is needed, at this time. Pt has an upcoming appt with PCP scheduled for 05/17/22 LCSW informed patient of care coordination services. Pt is not interested at this time and agreed to contact PCP, should needs arise          SDOH assessments and interventions completed:  No     Care Coordination Interventions Activated:  Yes  Care Coordination Interventions:  Yes, provided   Follow up plan: No further intervention required.   Encounter Outcome:  Pt. Refused   Christa See, MSW, [email or web address] Phone [phone] 10:06 AM

## 2022-04-04 ENCOUNTER — Ambulatory Visit: Payer: Medicare HMO | Attending: Internal Medicine

## 2022-04-04 DIAGNOSIS — Z23 Encounter for immunization: Secondary | ICD-10-CM | POA: Diagnosis not present

## 2022-04-06 ENCOUNTER — Other Ambulatory Visit (INDEPENDENT_AMBULATORY_CARE_PROVIDER_SITE_OTHER): Payer: Self-pay | Admitting: Family Medicine

## 2022-04-06 DIAGNOSIS — E1169 Type 2 diabetes mellitus with other specified complication: Secondary | ICD-10-CM

## 2022-04-07 ENCOUNTER — Other Ambulatory Visit: Payer: Self-pay | Admitting: Internal Medicine

## 2022-04-07 ENCOUNTER — Encounter: Payer: Self-pay | Admitting: Internal Medicine

## 2022-04-07 MED ORDER — GABAPENTIN 600 MG PO TABS: 600.0000 mg | ORAL_TABLET | Freq: Three times a day (TID) | ORAL | 2 refills | Status: DC

## 2022-04-11 ENCOUNTER — Other Ambulatory Visit: Payer: Self-pay | Admitting: Internal Medicine

## 2022-04-11 DIAGNOSIS — E785 Hyperlipidemia, unspecified: Secondary | ICD-10-CM

## 2022-04-15 ENCOUNTER — Other Ambulatory Visit: Payer: Self-pay | Admitting: Internal Medicine

## 2022-04-15 DIAGNOSIS — Z1231 Encounter for screening mammogram for malignant neoplasm of breast: Secondary | ICD-10-CM

## 2022-04-15 NOTE — Progress Notes (Unsigned)
Chief Complaint:   OBESITY Sarah [name] is here to discuss her progress with her obesity treatment plan along with follow-up of her obesity related diagnoses. Sarah [name] is on the Category 1 Plan with breakfast and lunch options and states she is following her eating plan approximately 80% of the time. Sarah [name] states she is doing silver sneakers for 45 minutes 3 times per week.  Today's visit was #: 63 Starting weight: 228 lbs Starting date: 02/15/2020 Today's weight: 198 lbs Today's date: 04/01/2022 Total lbs lost to date: 30 Total lbs lost since last in-office visit: 3  Interim History: Patient was seen in the emergency department recently for back pain.  Her work-up was negative.  Muscle relaxers and IcyHot were recommended.  Her notes, x-rays, and test were reviewed.  Degenerative changes were shown on her x-ray.  This is the second time with acute lower back pain.  She was seen by her PCP on 10/04/2021 and had x-rays then as well.  She did not eat much while she had back pain.  Subjective:   1. Type 2 diabetes mellitus with obesity (HCC) Patient's fasting blood sugars at home range between 120's-130's.  Her last A1c was 5.5.  She denies highs or lows.  2. Mood disorder (Sarah [name]) with emotional eating Patient is with increased stress due to back pain.  She skips meals but her mood is stable though overall.  No need for change in her medication dose.  3. Stage 3 chronic kidney disease, unspecified whether stage 3a or 3b CKD (Sarah [name]) Patient is concerned with her renal function as she was told in the emergency department that her function was decreased.  Her serum creatinine was 1.53 in January 2021 before she started the program with [place].  Assessment/Plan:  No orders of the defined types were placed in this encounter.   Medications Discontinued During This Encounter  Medication Reason   buPROPion (WELLBUTRIN SR) 150 MG 12 hr tablet Reorder   tirzepatide (MOUNJARO) 12.5 MG/0.5ML Pen Reorder      Meds ordered this encounter  Medications   buPROPion (WELLBUTRIN SR) 150 MG 12 hr tablet    Sig: Take 1 tablet (150 mg total) by mouth 2 (two) times daily.    Dispense:  60 tablet    Refill:  0   tirzepatide (MOUNJARO) 12.5 MG/0.5ML Pen    Sig: Inject 12.5 mg into the skin once a week.    Dispense:  2 mL    Refill:  0     1. Type 2 diabetes mellitus with obesity (HCC) We will refill Mounjaro for 1 month.  Good blood sugar control is important to decrease the likelihood of diabetic complications such as nephropathy, neuropathy, limb loss, blindness, coronary artery disease, and death. Intensive lifestyle modification including diet, exercise and weight loss are the first line of treatment for diabetes.   - tirzepatide (MOUNJARO) 12.5 MG/0.5ML Pen; Inject 12.5 mg into the skin once a week.  Dispense: 2 mL; Refill: 0  2. Mood disorder (Sarah [name]) with emotional eating We will refill Wellbutrin SR for 1 month.  Behavior modification techniques were discussed today to help Sarah [name] deal with her emotional/non-hunger eating behaviors.  Orders and follow up as documented in patient record.   - buPROPion (WELLBUTRIN SR) 150 MG 12 hr tablet; Take 1 tablet (150 mg total) by mouth 2 (two) times daily.  Dispense: 60 tablet; Refill: 0  3. Stage 3 chronic kidney disease, unspecified whether stage 3a or 3b CKD (Sarah [name]) I discussed  with the patient great control of blood sugars and blood pressure is critical and to increase her exercise with adequate water intake.  She is to avoid Advil or Aleve etc.  She will follow-up with her PCP to discuss possible renal changes if she is concerned.  4. Obesity, Current BMI 33.1 Sarah [name] is currently in the action stage of change. As such, her goal is to continue with weight loss efforts. She has agreed to the Category 1 Plan with breakfast and lunch options.    Exercise goals: Continue physical therapy back exercises at least 2 days/week and increase walking and cardio 2  more days per week.  Behavioral modification strategies: increasing lean protein intake and decreasing simple carbohydrates.  Sarah [name] has agreed to follow-up with our clinic in 2 to 4 weeks. She was informed of the importance of frequent follow-up visits to maximize her success with intensive lifestyle modifications for her multiple health conditions.   Objective:   Blood pressure 121/84, pulse 79, temperature 97.6 F (36.4 C), height '5\' 5"'$  (1.651 m), weight 198 lb (89.8 kg), SpO2 96 %. Body mass index is 32.95 kg/m.  General: Cooperative, alert, well developed, in no acute distress. HEENT: Conjunctivae and lids unremarkable. Cardiovascular: Regular rhythm.  Lungs: Normal work of breathing. Neurologic: No focal deficits.   Lab Results  Component Value Date   CREATININE 1.32 (H) 03/28/2022   BUN 19 03/28/2022   NA 139 03/28/2022   K 3.7 03/28/2022   CL 106 03/28/2022   CO2 26 03/28/2022   Lab Results  Component Value Date   ALT 29 01/09/2022   AST 26 01/09/2022   ALKPHOS 74 01/09/2022   BILITOT 0.5 01/09/2022   Lab Results  Component Value Date   HGBA1C 5.5 01/09/2022   HGBA1C 5.7 10/04/2021   HGBA1C 6.2 (H) 06/26/2021   HGBA1C 7.0 (H) 03/07/2021   HGBA1C 7.2 (H) 11/29/2020   No results found for: "INSULIN" Lab Results  Component Value Date   TSH 0.938 02/15/2020   Lab Results  Component Value Date   CHOL 167 01/09/2022   HDL 45 01/09/2022   LDLCALC 89 01/09/2022   TRIG 192 (H) 01/09/2022   CHOLHDL 3.7 01/09/2022   Lab Results  Component Value Date   VD25OH 35.7 10/22/2021   VD25OH 99.6 06/26/2021   VD25OH 81.1 03/07/2021   Lab Results  Component Value Date   WBC 5.4 03/28/2022   HGB 15.0 03/28/2022   HCT 46.8 (H) 03/28/2022   MCV 94.2 03/28/2022   PLT 318 03/28/2022   Lab Results  Component Value Date   IRON 43 08/18/2010   TIBC 389 08/18/2010   FERRITIN 227 08/18/2010    Obesity Behavioral Intervention:   Approximately 15 minutes were  spent on the discussion below.  ASK: We discussed the diagnosis of obesity with Sarah [name] today and Sarah [name] agreed to give [place] permission to discuss obesity behavioral modification therapy today.  ASSESS: Sarah [name] has the diagnosis of obesity and her BMI today is 33.1. Sarah [name] is in the action stage of change.   ADVISE: Sarah [name] was educated on the multiple health risks of obesity as well as the benefit of weight loss to improve her health. She was advised of the need for long term treatment and the importance of lifestyle modifications to improve her current health and to decrease her risk of future health problems.  AGREE: Multiple dietary modification options and treatment options were discussed and Sarah [name] agreed to follow the recommendations documented in the above note.  ARRANGE: Satonya was educated on the importance of frequent visits to treat obesity as outlined per CMS and USPSTF guidelines and agreed to schedule her next follow up appointment today.  Attestation Statements:   Reviewed by clinician on day of visit: allergies, medications, problem list, medical history, surgical history, family history, social history, and previous encounter notes.   Wilhemena Durie, am acting as transcriptionist for [employer], DO.   I have reviewed the above documentation for accuracy and completeness, and I agree with the above. Marjory Sneddon, D.O.  The [place] was signed into law in 2016 which includes the topic of electronic health records.  This provides immediate access to information in MyChart.  This includes consultation notes, operative notes, office notes, lab results and pathology reports.  If you have any questions about what you read please let us know at your next visit so we can discuss your concerns and take corrective action if need be.  We are right here with you.

## 2022-04-16 NOTE — Progress Notes (Signed)
Chief Complaint:   OBESITY Sarah [name] is here to discuss her progress with her obesity treatment plan along with follow-up of her obesity related diagnoses. Sarah [name] is on the Category 1 Plan + breakfast and lunch options and states she is following her eating plan approximately 85% of the time. Sarah [name] states she is going to [employer] 45 minutes 3 times per week.  Today's visit was #: 59 Starting weight: 228 lbs Starting date: 02/15/2020 Today's weight: 201 lbs Today's date: 03/14/2022 Total lbs lost to date: 27 lbs Total lbs lost since last in-office visit: +9 lbs  Interim History: I was "eye balling" portions of lean proteins and veggies.  I was eating Mr Doretha Sou, normal size, one per day.  Also, drinking Kool-Aid, full strength.  [name]   Subjective:   1. Type 2 diabetes mellitus with obesity ([place]) She still has some hunger and occasional cravings.  She is tolerating medications well and desires to increase in dose.  A1c 5.5,  2 months ago  2. Seasonal allergies Allergy symptoms well controlled.  Tolerating medications, and currently desires refills.   Assessment/Plan:  No orders of the defined types were placed in this encounter.   Medications Discontinued During This Encounter  Medication Reason   tirzepatide (MOUNJARO) 10 MG/0.5ML Pen    metFORMIN (GLUCOPHAGE) 1000 MG tablet Reorder   montelukast (SINGULAIR) 10 MG tablet Reorder   levocetirizine (XYZAL) 5 MG tablet Reorder     Meds ordered this encounter  Medications   levocetirizine (XYZAL) 5 MG tablet    Sig: Take 1 tablet (5 mg total) by mouth every evening.    Dispense:  30 tablet    Refill:  0   metFORMIN (GLUCOPHAGE) 500 MG tablet    Sig: Take 1 tablet (500 mg total) by mouth 2 (two) times daily with a meal.    Dispense:  60 tablet    Refill:  0   montelukast (SINGULAIR) 10 MG tablet    Sig: Take 1 tablet (10 mg total) by mouth at bedtime.     Dispense:  30 tablet    Refill:  0   DISCONTD: tirzepatide (MOUNJARO) 12.5 MG/0.5ML Pen    Sig: Inject 12.5 mg into the skin once a week.    Dispense:  2 mL    Refill:  0     1. Type 2 diabetes mellitus with obesity ([place]) - Counseled patient on pathophysiology of disease and discussed how good blood sugar control is important to decrease the risk of diabetic complications such as nephropathy, neuropathy, limb loss, blindness, coronary artery disease, etc.   - Intensive lifestyle modification including diet, exercise and weight loss are the first line of treatment for diabetes. We extensively discussed the importance of decreasing simple carbs and how certain foods they eat will affect their blood sugars - Reminded Sarah [name] if she feels poorly- check Blood Sugar and Blood Pressure at that time.    - Hypoglycemia prevention discussed with the patient.  Eat on a regular basis- no skipping or going long periods without eating.     - Recommend that any concerns about medicines should be directed at the prescribing provider - Recheck labs in 3 months if not done at Endo provider / PCP.  - Importance of f/up with PCP and all other specialists, as scheduled, was stressed to the patient today  No results found for: "INSULIN" Lab Results  Component Value Date  HGBA1C 5.5 01/09/2022   HGBA1C 5.7 10/04/2021   HGBA1C 6.2 (H) 06/26/2021    Increase Mounjaro to 12.5 mg weekly.   Refill - metFORMIN (GLUCOPHAGE) 500 MG tablet; Take 1 tablet (500 mg total) by mouth 2 (two) times daily with a meal.  Dispense: 60 tablet; Refill: 0  2. Seasonal allergies Sarah [name] has seasonal allergies that have been more bothersome with the change of weather/season.   Pt takes the prescribed medications regularlywhich has improved symptoms some and her quality of life.  Tolerating well, denies concerns. Refill prn- see below.   - Seasonal / environmental allergies and pathophysiology of disease process  discussed with patient.  - Preventative strategies as first line for management discussed.  I encouraged use of KN or N-95 mask prn as well as sterile saline rinses such as Milta Deiters Med or AYR sinus rinses to be done once- twice daily and after any prolonged exposure to the environment or allergen.    It is best to use distilled water or previously boiled water    - If eyes are itchy or irritated feeling when your seasonal allergies get bad, ok to use Naphcon-A over-the-counter eyedrops as needed - Encouraged to shower in evenings if they have any prolonged exposure to allergens during the day  Refill - levocetirizine (XYZAL) 5 MG tablet; Take 1 tablet (5 mg total) by mouth every evening.  Dispense: 30 tablet; Refill: 0  Refill - montelukast (SINGULAIR) 10 MG tablet; Take 1 tablet (10 mg total) by mouth at bedtime.  Dispense: 30 tablet; Refill: 0  3. Obesity, Current BMI 33.4 Sarah [name] is currently in the action stage of change. As such, her goal is to continue with weight loss efforts. She has agreed to the Category 1 Plan + breakfast and lunch options.   Exercise goals:  As is, but increase as tolerated.   Behavioral modification strategies: increasing lean protein intake, decreasing simple carbohydrates, and planning for success.  Sarah [name] has agreed to follow-up with our clinic in 2-3 weeks. She was informed of the importance of frequent follow-up visits to maximize her success with intensive lifestyle modifications for her multiple health conditions.   Objective:   Blood pressure 130/87, pulse 72, temperature 98 F (36.7 C), height '5\' 5"'$  (1.651 m), weight 201 lb (91.2 kg), SpO2 100 %. Body mass index is 33.45 kg/m.  General: Cooperative, alert, well developed, in no acute distress. HEENT: Conjunctivae and lids unremarkable. Cardiovascular: Regular rhythm.  Lungs: Normal work of breathing. Neurologic: No focal deficits.   Lab Results  Component Value Date   CREATININE 1.32 (H) 03/28/2022    BUN 19 03/28/2022   NA 139 03/28/2022   K 3.7 03/28/2022   CL 106 03/28/2022   CO2 26 03/28/2022   Lab Results  Component Value Date   ALT 29 01/09/2022   AST 26 01/09/2022   ALKPHOS 74 01/09/2022   BILITOT 0.5 01/09/2022   Lab Results  Component Value Date   HGBA1C 5.5 01/09/2022   HGBA1C 5.7 10/04/2021   HGBA1C 6.2 (H) 06/26/2021   HGBA1C 7.0 (H) 03/07/2021   HGBA1C 7.2 (H) 11/29/2020   No results found for: "INSULIN" Lab Results  Component Value Date   TSH 0.938 02/15/2020   Lab Results  Component Value Date   CHOL 167 01/09/2022   HDL 45 01/09/2022   LDLCALC 89 01/09/2022   TRIG 192 (H) 01/09/2022   CHOLHDL 3.7 01/09/2022   Lab Results  Component Value Date   VD25OH 35.7  10/22/2021   VD25OH 99.6 06/26/2021   VD25OH 81.1 03/07/2021   Lab Results  Component Value Date   WBC 5.4 03/28/2022   HGB 15.0 03/28/2022   HCT 46.8 (H) 03/28/2022   MCV 94.2 03/28/2022   PLT 318 03/28/2022   Lab Results  Component Value Date   IRON 43 08/18/2010   TIBC 389 08/18/2010   FERRITIN 227 08/18/2010   Obesity Behavioral Intervention:   Approximately 15 minutes were spent on the discussion below.  ASK: We discussed the diagnosis of obesity with Sarah [name] today and Sarah [name] agreed to give [place] permission to discuss obesity behavioral modification therapy today.  ASSESS: Sarah [name] has the diagnosis of obesity and her BMI today is 33.4. Sarah [name] is in the action stage of change.   ADVISE: Sarah [name] was educated on the multiple health risks of obesity as well as the benefit of weight loss to improve her health. She was advised of the need for long term treatment and the importance of lifestyle modifications to improve her current health and to decrease her risk of future health problems.  AGREE: Multiple dietary modification options and treatment options were discussed and Sarah [name] agreed to follow the recommendations documented in the above note.  ARRANGE: Sarah [name] was educated on  the importance of frequent visits to treat obesity as outlined per CMS and USPSTF guidelines and agreed to schedule her next follow up appointment today.  Attestation Statements:   Reviewed by clinician on day of visit: allergies, medications, problem list, medical history, surgical history, family history, social history, and previous encounter notes.  I, Davy Pique, RMA, am acting as Location manager for [employer], DO.   I have reviewed the above documentation for accuracy and completeness, and I agree with the above. Marjory Sneddon, D.O.  The [place] was signed into law in 2016 which includes the topic of electronic health records.  This provides immediate access to information in MyChart.  This includes consultation notes, operative notes, office notes, lab results and pathology reports.  If you have any questions about what you read please let us know at your next visit so we can discuss your concerns and take corrective action if need be.  We are right here with you.

## 2022-04-18 ENCOUNTER — Ambulatory Visit (INDEPENDENT_AMBULATORY_CARE_PROVIDER_SITE_OTHER): Payer: Medicare HMO | Admitting: Family Medicine

## 2022-04-21 ENCOUNTER — Other Ambulatory Visit (INDEPENDENT_AMBULATORY_CARE_PROVIDER_SITE_OTHER): Payer: Self-pay | Admitting: Family Medicine

## 2022-04-21 DIAGNOSIS — E1169 Type 2 diabetes mellitus with other specified complication: Secondary | ICD-10-CM

## 2022-04-29 ENCOUNTER — Encounter (INDEPENDENT_AMBULATORY_CARE_PROVIDER_SITE_OTHER): Payer: Self-pay | Admitting: Family Medicine

## 2022-04-29 ENCOUNTER — Ambulatory Visit (INDEPENDENT_AMBULATORY_CARE_PROVIDER_SITE_OTHER): Payer: Medicare HMO | Admitting: Family Medicine

## 2022-04-29 VITALS — BP 134/86 | HR 71 | Temp 98.2°F | Ht 65.0 in | Wt 198.0 lb

## 2022-04-29 DIAGNOSIS — E669 Obesity, unspecified: Secondary | ICD-10-CM

## 2022-04-29 DIAGNOSIS — Z7984 Long term (current) use of oral hypoglycemic drugs: Secondary | ICD-10-CM

## 2022-04-29 DIAGNOSIS — F39 Unspecified mood [affective] disorder: Secondary | ICD-10-CM

## 2022-04-29 DIAGNOSIS — E1169 Type 2 diabetes mellitus with other specified complication: Secondary | ICD-10-CM

## 2022-04-29 DIAGNOSIS — Z6833 Body mass index (BMI) 33.0-33.9, adult: Secondary | ICD-10-CM | POA: Diagnosis not present

## 2022-04-29 DIAGNOSIS — Z9109 Other allergy status, other than to drugs and biological substances: Secondary | ICD-10-CM | POA: Diagnosis not present

## 2022-04-29 DIAGNOSIS — J302 Other seasonal allergic rhinitis: Secondary | ICD-10-CM

## 2022-04-29 DIAGNOSIS — E66812 Obesity, class 2: Secondary | ICD-10-CM

## 2022-04-29 MED ORDER — MONTELUKAST SODIUM 10 MG PO TABS: 10.0000 mg | ORAL_TABLET | Freq: Every day | ORAL | 0 refills | Status: DC

## 2022-04-29 MED ORDER — TIRZEPATIDE 12.5 MG/0.5ML ~~LOC~~ SOAJ: 12.5000 mg | SUBCUTANEOUS | 0 refills | Status: DC

## 2022-04-29 MED ORDER — BUPROPION HCL ER (SR) 150 MG PO TB12: 150.0000 mg | ORAL_TABLET | Freq: Two times a day (BID) | ORAL | 0 refills | Status: DC

## 2022-04-29 MED ORDER — LEVOCETIRIZINE DIHYDROCHLORIDE 5 MG PO TABS: 5.0000 mg | ORAL_TABLET | Freq: Every evening | ORAL | 0 refills | Status: DC

## 2022-04-29 MED ORDER — METFORMIN HCL 500 MG PO TABS: 500.0000 mg | ORAL_TABLET | Freq: Two times a day (BID) | ORAL | 0 refills | Status: DC

## 2022-05-08 NOTE — ED Provider Notes (Signed)
[facility] EMERGENCY DEPARTMENT Provider Note   CSN: [id number] Arrival date & time: 03/28/22  1159     History {Add pertinent medical, surgical, social history, OB history to HPI:1} Chief Complaint  Patient presents with   Back Pain    Right lower     Sarah [name] is a 56 y.o. female.  Sarah [name] is a 56 y.o. female with a history of hypertension, diabetes, sleep apnea, GERD, ovarian cancer, who presents to the emergency department for evaluation of right upper back pain.  Patient reports pain feels like a tightness in her right mid back worse with certain movements and worse when laying down.  She reports it does intermittently radiate down her back.  She has no history of similar pain.  She denies any known trauma or injury to the area   Back Pain      Home Medications Prior to Admission medications   Medication Sig Start Date End Date Taking? Authorizing Provider  methocarbamol (ROBAXIN) 500 MG tablet Take 1 tablet (500 mg total) by mouth 2 (two) times daily. 03/28/22  Yes Jacqlyn Larsen, PA-C  Accu-Chek Softclix Lancets lancets TEST BLOOD SUGAR THREE TIMES DAILY 12/26/21   Ladell Pier, MD  albuterol (VENTOLIN HFA) 108 (90 Base) MCG/ACT inhaler Inhale 2 puffs into the lungs every 6 (six) hours as needed for wheezing or shortness of breath. 11/03/19   Ladell Pier, MD  amLODipine (NORVASC) 10 MG tablet Take 1 tablet (10 mg total) by mouth daily. 08/07/21   Ladell Pier, MD  aspirin-sod bicarb-citric acid (ALKA-SELTZER) 325 MG TBEF tablet Take 650 mg by mouth every 6 (six) hours as needed (indigestion).    [provider]  Blood Glucose Monitoring Suppl (ACCU-CHEK AVIVA PLUS) w/Device KIT USE AS DIRECTED 08/10/19   Ladell Pier, MD  buPROPion [facility] SR) 150 MG 12 hr tablet Take 1 tablet (150 mg total) by mouth 2 (two) times daily. 04/29/22   Opalski, Deborah, DO  dorzolamide-timolol (COSOPT) 22.3-6.8 MG/ML ophthalmic solution  Place 1 drop into both eyes 2 (two) times daily.    [provider]  DROPLET PEN NEEDLES 29G X 12MM MISC USE AS DIRECTED 01/16/22   Ladell Pier, MD  gabapentin (NEURONTIN) 600 MG tablet Take 1 tablet (600 mg total) by mouth 3 (three) times daily. 04/07/22   Ladell Pier, MD  glucose blood (ACCU-CHEK AVIVA PLUS) test strip TEST BLOOD SUGAR AS DIRECTED 12/26/21   Ladell Pier, MD  hydrALAZINE (APRESOLINE) 25 MG tablet TAKE 1 AND 1/2 TABLETS THREE TIMES DAILY 09/06/21   Ladell Pier, MD  Lancets Misc. (ACCU-CHEK SOFTCLIX LANCET DEV) KIT Check blood sugars 3 times a day. 10/24/20   Ladell Pier, MD  LANTUS SOLOSTAR 100 UNIT/ML Solostar Pen INJECT 30 UNITS DAILY. INCREASE BY 2 UNITS EVERY 3 DAYS UNTIL MORNING SUGAR IS LESS THAN 130. MAX OF 40 UNITS. Patient taking differently: Inject 20 Units into the skin at bedtime. 11/15/20   Ladell Pier, MD  latanoprost (XALATAN) 0.005 % ophthalmic solution Place 1 drop into both eyes at bedtime.    [provider]  levocetirizine (XYZAL) 5 MG tablet Take 1 tablet (5 mg total) by mouth every evening. 04/29/22   Opalski, Neoma Laming, DO  losartan (COZAAR) 50 MG tablet Take 1 tablet (50 mg total) by mouth daily. 10/14/21   Ladell Pier, MD  metFORMIN (GLUCOPHAGE) 500 MG tablet Take 1 tablet (500 mg total) by mouth 2 (  two) times daily with a meal. 04/29/22   Opalski, Neoma Laming, DO  metoprolol tartrate (LOPRESSOR) 25 MG tablet TAKE 1/2 TABLET TWICE DAILY 01/28/22   Ladell Pier, MD  montelukast (SINGULAIR) 10 MG tablet Take 1 tablet (10 mg total) by mouth at bedtime. 04/29/22   Mellody Dance, DO  rosuvastatin (CRESTOR) 20 MG tablet TAKE 1 TABLET EVERY DAY 04/11/22   Ladell Pier, MD  solifenacin (VESICARE) 5 MG tablet TAKE 1 TABLET EVERY DAY 12/25/21   Ladell Pier, MD  tirzepatide [facility]) 12.5 MG/0.5ML Pen Inject 12.5 mg into the skin once a week. 04/29/22   Opalski, Neoma Laming, DO  traZODone (DESYREL) 100  MG tablet Take 1 tablet (100 mg total) by mouth at bedtime as needed for sleep. 11/21/21   Opalski, Neoma Laming, DO  vitamin B-12 (CYANOCOBALAMIN) 500 MCG tablet Take 1 tablet (500 mcg total) by mouth daily. 02/29/20   Mellody Dance, DO      Allergies    Emend [aprepitant] and Lisinopril    Review of Systems   Review of Systems  Musculoskeletal:  Positive for back pain.    Physical Exam Updated Vital Signs BP 125/71 (BP Location: Right Arm)   Pulse 61   Temp 97.8 F (36.6 C) (Oral)   Resp 18   Ht 5' 5" (1.651 m)   Wt 90.7 kg   LMP  (LMP Unknown)   SpO2 99%   BMI 33.28 kg/m  Physical Exam  ED Results / Procedures / Treatments   Labs (all labs ordered are listed, but only abnormal results are displayed) Labs Reviewed  BASIC METABOLIC PANEL - Abnormal; Notable for the following components:      Result Value   Creatinine, Ser 1.32 (*)    Calcium 11.3 (*)    GFR, Estimated 47 (*)    All other components within normal limits  CBC WITH DIFFERENTIAL/PLATELET - Abnormal; Notable for the following components:   HCT 46.8 (*)    All other components within normal limits  D-DIMER, QUANTITATIVE  TROPONIN I (HIGH SENSITIVITY)    EKG EKG Interpretation  Date/Time:  Thursday March 28 2022 15:12:30 EDT Ventricular Rate:  60 PR Interval:  187 QRS Duration: 80 QT Interval:  415 QTC Calculation: 415 R Axis:   25 Text Interpretation: Sinus rhythm Low voltage, precordial leads Anteroseptal infarct, old No significant change since prior 1/23 Confirmed by Aletta Edouard [phone]) on 03/28/2022 3:14:19 PM  Radiology No results found.  Procedures Procedures  {Document cardiac monitor, telemetry assessment procedure when appropriate:1}  Medications Ordered in ED Medications  acetaminophen (TYLENOL) tablet 1,000 mg (1,000 mg Oral Given 03/28/22 1542)  methocarbamol (ROBAXIN) tablet 500 mg (500 mg Oral Given 03/28/22 1542)    ED Course/ Medical Decision Making/ A&P                            Medical Decision Making Amount and/or Complexity of Data Reviewed Labs: ordered. Radiology: ordered.  Risk OTC drugs. Prescription drug management.   ***  {Document critical care time when appropriate:1} {Document review of labs and clinical decision tools ie heart score, Chads2Vasc2 etc:1}  {Document your independent review of radiology images, and any outside records:1} {Document your discussion with family members, caretakers, and with consultants:1} {Document social determinants of health affecting pt's care:1} {Document your decision making why or why not admission, treatments were needed:1} Final Clinical Impression(s) / ED Diagnoses Final diagnoses:  Acute right-sided thoracic back pain  Rx / DC Orders ED Discharge Orders          Ordered    methocarbamol (ROBAXIN) 500 MG tablet  2 times daily        03/28/22 1642

## 2022-05-13 ENCOUNTER — Encounter (INDEPENDENT_AMBULATORY_CARE_PROVIDER_SITE_OTHER): Payer: Self-pay | Admitting: Physician Assistant

## 2022-05-14 ENCOUNTER — Encounter (INDEPENDENT_AMBULATORY_CARE_PROVIDER_SITE_OTHER): Payer: Self-pay | Admitting: Physician Assistant

## 2022-05-14 ENCOUNTER — Ambulatory Visit (INDEPENDENT_AMBULATORY_CARE_PROVIDER_SITE_OTHER): Payer: Medicare HMO | Admitting: Physician Assistant

## 2022-05-14 VITALS — BP 115/78 | HR 76 | Temp 98.5°F | Ht 65.0 in | Wt 199.0 lb

## 2022-05-14 DIAGNOSIS — Z7984 Long term (current) use of oral hypoglycemic drugs: Secondary | ICD-10-CM

## 2022-05-14 DIAGNOSIS — F39 Unspecified mood [affective] disorder: Secondary | ICD-10-CM

## 2022-05-14 DIAGNOSIS — E1169 Type 2 diabetes mellitus with other specified complication: Secondary | ICD-10-CM | POA: Diagnosis not present

## 2022-05-14 DIAGNOSIS — Z6833 Body mass index (BMI) 33.0-33.9, adult: Secondary | ICD-10-CM | POA: Diagnosis not present

## 2022-05-14 DIAGNOSIS — E669 Obesity, unspecified: Secondary | ICD-10-CM | POA: Diagnosis not present

## 2022-05-14 DIAGNOSIS — K219 Gastro-esophageal reflux disease without esophagitis: Secondary | ICD-10-CM | POA: Diagnosis not present

## 2022-05-14 DIAGNOSIS — K5900 Constipation, unspecified: Secondary | ICD-10-CM | POA: Diagnosis not present

## 2022-05-14 DIAGNOSIS — Z7985 Long-term (current) use of injectable non-insulin antidiabetic drugs: Secondary | ICD-10-CM | POA: Diagnosis not present

## 2022-05-14 DIAGNOSIS — Z794 Long term (current) use of insulin: Secondary | ICD-10-CM | POA: Diagnosis not present

## 2022-05-14 MED ORDER — OMEPRAZOLE 20 MG PO CPDR: 20.0000 mg | DELAYED_RELEASE_CAPSULE | Freq: Every day | ORAL | 3 refills | Status: DC

## 2022-05-14 NOTE — Progress Notes (Signed)
Chief Complaint:   OBESITY Sarah [name] is here to discuss her progress with her obesity treatment plan along with follow-up of her obesity related diagnoses. [name] is on the Category 1 Plan  and breakfast and lunch options and states she is following her eating plan approximately 85% of the time. [name] states she is doing silver sneakers, walking 15-45 minutes 2-3 times per week.  Today's visit was #: 35 Starting weight: 228 lbs Starting date: 02/15/2020 Today's weight: 198 lbs Today's date: 04/29/2022 Total lbs lost to date: 30 lbs Total lbs lost since last in-office visit: 0  Interim History: She occasionally has increased hunger and cravings after dinner.  Patient feels stressed over her sisters grieving process with the loss of her husband who passed away in 12-19-22.    Subjective:   1. Type 2 diabetes mellitus with obesity (H[facility]) FBS, highest 103.  Lowest 88.  She is asymptomatic metformin in AM. A1c is 5.5 about 3 months ago  patient denies need for dose adjustment.    2. Environmental allergies Taking Xyzal and Singulair at night time.  Helping with sleep and less congested at night and less awakenings.  She has less coughing in the daytime.   3. Mood disorder ([place]) with emotional eating Patient mood stable. Denies depressed mood.    Assessment/Plan:  No orders of the defined types were placed in this encounter.   Medications Discontinued During This Encounter  Medication Reason   levocetirizine (XYZAL) 5 MG tablet    metFORMIN (GLUCOPHAGE) 500 MG tablet Reorder   montelukast (SINGULAIR) 10 MG tablet Reorder   buPROPion (WELLBUTRIN SR) 150 MG 12 hr tablet Reorder   tirzepatide (MOUNJARO) 12.5 MG/0.5ML Pen Reorder     Meds ordered this encounter  Medications   montelukast (SINGULAIR) 10 MG tablet    Sig: Take 1 tablet (10 mg total) by mouth at bedtime.    Dispense:  30 tablet    Refill:  0   buPROPion (WELLBUTRIN SR) 150 MG 12 hr tablet    Sig: Take 1 tablet (150  mg total) by mouth 2 (two) times daily.    Dispense:  60 tablet    Refill:  0   tirzepatide (MOUNJARO) 12.5 MG/0.5ML Pen    Sig: Inject 12.5 mg into the skin once a week.    Dispense:  2 mL    Refill:  0   DISCONTD: metFORMIN (GLUCOPHAGE) 500 MG tablet    Sig: Take 1 tablet (500 mg total) by mouth 2 (two) times daily with a meal.    Dispense:  60 tablet    Refill:  0   levocetirizine (XYZAL) 5 MG tablet    Sig: Take 1 tablet (5 mg total) by mouth every evening.    Dispense:  30 tablet    Refill:  0     1. Type 2 diabetes mellitus with obesity ([name]) - Counseled patient on pathophysiology of disease and discussed how good blood sugar control is important to decrease the risk of diabetic complications such as nephropathy, neuropathy, limb loss, blindness, coronary artery disease, etc.   - Intensive lifestyle modification including diet, exercise and weight loss are the first line of treatment for diabetes. We extensively discussed the importance of decreasing simple carbs and how certain foods they eat will affect their blood sugars - Reminded [name] if she feels poorly- check Blood Sugar and Blood Pressure at that time.    - Hypoglycemia prevention discussed with the patient.  Eat  on a regular basis- no skipping or going long periods without eating.     - Recommend that any concerns about medicines should be directed at the prescribing provider - Recheck labs in 3 months if not done at Endo provider / PCP.  - Importance of f/up with PCP and all other specialists, as scheduled, was stressed to the patient today.  Refill - tirzepatide (MOUNJARO) 12.5 MG/0.5ML Pen; Inject 12.5 mg into the skin once a week.  Dispense: 2 mL; Refill: 0  Refill - metFORMIN (GLUCOPHAGE) 500 MG tablet; Take 1 tablet (500 mg total) by mouth 2 (two) times daily with a meal.  Dispense: 60 tablet; Refill: 0  2. Environmental allergies Patient states that she needs these medications, with winter approaching.  Preventative strategies discussed with patient.   Refill - montelukast (SINGULAIR) 10 MG tablet; Take 1 tablet (10 mg total) by mouth at bedtime.  Dispense: 30 tablet; Refill: 0  Refill - levocetirizine (XYZAL) 5 MG tablet; Take 1 tablet (5 mg total) by mouth every evening.  Dispense: 30 tablet; Refill: 0  3. Mood disorder ([place]) with emotional eating Emotional eating strategies discussed with patient. Paying attention to emotional vs physical hunger signals discussed.  Extra time spent with patient on how she can help her sister with her depression.   Refill - buPROPion (WELLBUTRIN SR) 150 MG 12 hr tablet; Take 1 tablet (150 mg total) by mouth 2 (two) times daily.  Dispense: 60 tablet; Refill: 0  4. Obesity, Current BMI 33.1 Handout on mindful eating given discussed with patient on strategies for emotional eating.  No eating on couch or in bed only at the table.  Also gave handouts on holiday eating strategies.   [name] is currently in the action stage of change. As such, her goal is to continue with weight loss efforts. She has agreed to the Category 1 Plan+ breakfast and lunch options.   Exercise goals: For substantial health benefits, adults should do at least 150 minutes (2 hours and 30 minutes) a week of moderate-intensity, or 75 minutes (1 hour and 15 minutes) a week of vigorous-intensity aerobic physical activity, or an equivalent combination of moderate- and vigorous-intensity aerobic activity. Aerobic activity should be performed in episodes of at least 10 minutes, and preferably, it should be spread throughout the week.  Behavioral modification strategies: emotional eating strategies, holiday eating strategies , and avoiding temptations.  [name] has agreed to follow-up with our clinic in 2 weeks. She was informed of the importance of frequent follow-up visits to maximize her success with intensive lifestyle modifications for her multiple health conditions.   Objective:   Blood  pressure 134/86, pulse 71, temperature 98.2 F (36.8 C), height '5\' 5"'$  (1.651 m), weight 198 lb (89.8 kg), SpO2 100 %. Body mass index is 32.95 kg/m.  General: Cooperative, alert, well developed, in no acute distress. HEENT: Conjunctivae and lids unremarkable. Cardiovascular: Regular rhythm.  Lungs: Normal work of breathing. Neurologic: No focal deficits.   Lab Results  Component Value Date   CREATININE 1.32 (H) 03/28/2022   BUN 19 03/28/2022   NA 139 03/28/2022   K 3.7 03/28/2022   CL 106 03/28/2022   CO2 26 03/28/2022   Lab Results  Component Value Date   ALT 29 01/09/2022   AST 26 01/09/2022   ALKPHOS 74 01/09/2022   BILITOT 0.5 01/09/2022   Lab Results  Component Value Date   HGBA1C 5.5 01/09/2022   HGBA1C 5.7 10/04/2021   HGBA1C 6.2 (H) 06/26/2021  HGBA1C 7.0 (H) 03/07/2021   HGBA1C 7.2 (H) 11/29/2020   No results found for: "INSULIN" Lab Results  Component Value Date   TSH 0.938 02/15/2020   Lab Results  Component Value Date   CHOL 167 01/09/2022   HDL 45 01/09/2022   LDLCALC 89 01/09/2022   TRIG 192 (H) 01/09/2022   CHOLHDL 3.7 01/09/2022   Lab Results  Component Value Date   VD25OH 35.7 10/22/2021   VD25OH 99.6 06/26/2021   VD25OH 81.1 03/07/2021   Lab Results  Component Value Date   WBC 5.4 03/28/2022   HGB 15.0 03/28/2022   HCT 46.8 (H) 03/28/2022   MCV 94.2 03/28/2022   PLT 318 03/28/2022   Lab Results  Component Value Date   IRON 43 08/18/2010   TIBC 389 08/18/2010   FERRITIN 227 08/18/2010    Attestation Statements:   Reviewed by clinician on day of visit: allergies, medications, problem list, medical history, surgical history, family history, social history, and previous encounter notes.  I, Davy Pique, RMA, am acting as Location manager for [employer], DO.   I have reviewed the above documentation for accuracy and completeness, and I agree with the above. Marjory Sneddon, D.O.  The [place] was signed  into law in 2016 which includes the topic of electronic health records.  This provides immediate access to information in MyChart.  This includes consultation notes, operative notes, office notes, lab results and pathology reports.  If you have any questions about what you read please let us know at your next visit so we can discuss your concerns and take corrective action if need be.  We are right here with you.

## 2022-05-15 ENCOUNTER — Telehealth: Payer: Self-pay

## 2022-05-15 NOTE — Patient Outreach (Signed)
  Care Coordination   05/15/2022 Name: KARMIN KASPRZAK MRN: [id number] DOB: 03/07/1966   Care Coordination Outreach Attempts:  An unsuccessful telephone outreach was attempted today to offer the patient information about available care coordination services as a benefit of their health plan.   Follow Up Plan:  Additional outreach attempts will be made to offer the patient care coordination information and services.   Encounter Outcome:  No Answer   Care Coordination Interventions:  No, not indicated    Enzo Montgomery, RN,BSN,CCM [facility] Telephonic Care Management Coordinator Direct Phone: [phone] Toll Free: [phone] Fax: [phone]

## 2022-05-15 NOTE — Patient Outreach (Signed)
  Care Coordination   Initial Visit Note   05/15/2022 Name: Sarah [name] MRN: [id number] DOB: 11-19-1965  Sarah [name] is a 56 y.o. year old female who sees Ladell Pier, MD for primary care. Incoming call from patient returning RN CM call. I spoke with  [name] by phone today.  What matters to the patients health and wellness today?  Patient voices that she is doing fairly well and managing her chronic conditions.She voice some fatigue and generalized weakness. She is unsure of cause. She has PCP appt this week and plans to discuss with MD as she thinks it may be related to some of her meds. She reports that she has also has been referred to endocrinologist-appt next month.     Goals Addressed             This Visit's Progress    COMPLETED: Care Coordination-no follow up required       Care Coordination Interventions: Advised patient to schedule annual diabetic eye exam-she will call [employer] to get list of in-network providers Provided education to patient re: [facility] services Assessed social determinant of health barriers Reviewed health maintenance measures-Annual Wellness Visit completed 06/26/21, flu vaccine completed 04/04/22            SDOH assessments and interventions completed:  Yes  SDOH Interventions Today    Flowsheet Row Most Recent Value  SDOH Interventions   Food Insecurity Interventions Intervention Not Indicated  Transportation Interventions Intervention Not Indicated        Care Coordination Interventions:  Yes, provided   Follow up plan: No further intervention required.   Encounter Outcome:  Pt. Visit Completed    Enzo Montgomery, RN,BSN,CCM [facility] Telephonic Care Management Coordinator Direct Phone: [phone] Toll Free: [phone] Fax: [phone]

## 2022-05-17 ENCOUNTER — Ambulatory Visit: Payer: Medicare HMO | Attending: Internal Medicine | Admitting: Internal Medicine

## 2022-05-17 VITALS — BP 126/85 | HR 74 | Temp 97.9°F | Ht 65.0 in | Wt 205.0 lb

## 2022-05-17 DIAGNOSIS — M546 Pain in thoracic spine: Secondary | ICD-10-CM

## 2022-05-17 DIAGNOSIS — G620 Drug-induced polyneuropathy: Secondary | ICD-10-CM | POA: Diagnosis not present

## 2022-05-17 DIAGNOSIS — T451X5A Adverse effect of antineoplastic and immunosuppressive drugs, initial encounter: Secondary | ICD-10-CM

## 2022-05-17 DIAGNOSIS — I152 Hypertension secondary to endocrine disorders: Secondary | ICD-10-CM | POA: Diagnosis not present

## 2022-05-17 DIAGNOSIS — M545 Low back pain, unspecified: Secondary | ICD-10-CM

## 2022-05-17 DIAGNOSIS — I700 Atherosclerosis of aorta: Secondary | ICD-10-CM | POA: Diagnosis not present

## 2022-05-17 DIAGNOSIS — E785 Hyperlipidemia, unspecified: Secondary | ICD-10-CM | POA: Diagnosis not present

## 2022-05-17 DIAGNOSIS — G8929 Other chronic pain: Secondary | ICD-10-CM | POA: Diagnosis not present

## 2022-05-17 DIAGNOSIS — Z6834 Body mass index (BMI) 34.0-34.9, adult: Secondary | ICD-10-CM

## 2022-05-17 DIAGNOSIS — E1159 Type 2 diabetes mellitus with other circulatory complications: Secondary | ICD-10-CM | POA: Diagnosis not present

## 2022-05-17 DIAGNOSIS — E669 Obesity, unspecified: Secondary | ICD-10-CM

## 2022-05-17 DIAGNOSIS — E1169 Type 2 diabetes mellitus with other specified complication: Secondary | ICD-10-CM

## 2022-05-17 DIAGNOSIS — Z8543 Personal history of malignant neoplasm of ovary: Secondary | ICD-10-CM

## 2022-05-17 LAB — POCT GLYCOSYLATED HEMOGLOBIN (HGB A1C): HbA1c, POC (controlled diabetic range): 5.5 % (ref 0.0–7.0)

## 2022-05-17 LAB — GLUCOSE, POCT (MANUAL RESULT ENTRY): POC Glucose: 115 mg/dl — AB (ref 70–99)

## 2022-05-17 MED ORDER — METFORMIN HCL 500 MG PO TABS: 500.0000 mg | ORAL_TABLET | Freq: Every day | ORAL | 1 refills | Status: DC

## 2022-05-17 MED ORDER — FREESTYLE LIBRE SENSOR SYSTEM MISC: 12 refills | Status: DC

## 2022-05-17 MED ORDER — LIDOCAINE 5 % EX PTCH: 1.0000 | MEDICATED_PATCH | CUTANEOUS | 1 refills | Status: DC

## 2022-05-17 MED ORDER — METHOCARBAMOL 500 MG PO TABS: 500.0000 mg | ORAL_TABLET | Freq: Two times a day (BID) | ORAL | 3 refills | Status: DC | PRN

## 2022-05-17 NOTE — Progress Notes (Unsigned)
Patient ID: Sarah [name], female    DOB: 11-13-1965  MRN: [id number]  CC: Diabetes (DM f/u./Discuss the arthritis in the back. Pain radiating from back to shoulder & arms. Neoma Laming received flu vax this season. )   Subjective: Sarah [name] is a 56 y.o. 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 (S/p adjuvant chemotherapy completed on 12/08/14. S/p ex lap, hernia repair and small bowel resection for a presumed recurrence (benign pathology), Hypercalcemia with elevated free light chains (BM bx [name] negative, no need for further f/u), dx with [place] by [name], 1.1 cm RLL nodule (bx negative 01/2020), RT thyroid nodule (does not met criteria for bx 10/2019),   DM:  Results for orders placed or performed in visit on 05/17/22  POCT glucose (manual entry)  Result Value Ref Range   POC Glucose 115 (A) 70 - 99 mg/dl  POCT glycosylated hemoglobin (Hb A1C)  Result Value Ref Range   Hemoglobin A1C     HbA1c POC (<> result, manual entry)     HbA1c, POC (prediabetic range)     HbA1c, POC (controlled diabetic range) 5.5 0.0 - 7.0 %  She is compliant with taking Mounjaro 2-week, Lantus insulin 20 units daily and metformin 500 units twice a day. Blood sugars before BF less than 103, and bedtime less than 148 No low BS Good eating habits but ate a little more over thanksgiving.  Weight is up 6 pounds since she last saw Dr. Raliegh Scarlet in October. Exercise 3x/wk at [facility] Neuropathy symptoms in her legs and feet associated more so from chemotherapy is about same - on Gabapentin which helps. She is compliant with taking her blood pressure medications and limiting salt in the foods.  Hypercalcemia: She has an upcoming appointment with [facility] endocrinology next month.  HL/aortic  atherosclerosis: Tried with Crestor 20 mg daily.  Incidental aortic atherosclerosis seen on CT of the chest that was done in February of last year.  CKD 3a/b: GFR over the past year has ranged from 43-52 with most recent reading of 47.  Creatinine has ranged from 1.23-1.44 with most recent reading being 1.32.  She is not on any NSAIDs.  Hx of ovarian CA:  saw [name] 2 yrs ago.  No signs of recurrence at that time.  Issues with back Burns sometimes mid to lower thoracic and lumbar region in the middle and surrounding paraspinal muscles Has to use heating pad several days a wk Constant.   No radiation down legs only one time down RT leg.  No nubmness tingling Worse with standing - cooking and washing dishes Does not feel bad when she works out most times but some days pain increases.  During workout classes at [facility] she  does some light wgh lifting of no more than 3 lbs, stretching, low impact aerobics Wears back support belt around her waist Pain started in 2016 when she was on chemo for ovarian CA.  Getting worse Rate pain 10/10 all the time.   Seen in ER 03/28/2022 for the same.  X-ray of lumbar spine revealed degenerative changes.  Patient Active Problem List   Diagnosis Date Noted   Hypomagnesemia 02/04/2022   SOB (shortness of breath) on exertion 02/04/2022   Mixed stress and urge urinary incontinence 10/04/2021   S/P  hernia repair 07/03/2021   Atherosclerosis of aorta ([place]) 05/28/2021   Mood disorder ([place]) with emotional eating 05/21/2021   Cutaneous abscess of back excluding buttocks 01/12/2021   Depressed mood, with emotional eating 10/23/2020   Seasonal allergies 09/05/2020   Class 2 severe obesity with serious comorbidity and body mass index (BMI) of 36.0 to 36.9 in adult ([place]) 07/24/2020   At risk for impaired metabolic function 33/74/4514   Hypertension associated with type 2 diabetes mellitus ([place]) 05/15/2020   Stage 3 chronic kidney disease ([place]) 05/01/2020   Type 2  diabetes mellitus with chronic kidney disease, with long-term current use of insulin ([place]) 04/17/2020   At risk for hypoglycemia 04/17/2020   B12 deficiency 02/29/2020   Diabetes mellitus ([place]) 02/15/2020   Vitamin D deficiency 02/15/2020   Other insomnia 01/24/2020   Morbid obesity ([place]) 01/24/2020   Elevated serum immunoglobulin free light chains 01/21/2020   Thyroid nodule 10/14/2019   Renal insufficiency 06/24/2019   Hypercalcemia 06/24/2019   Hiatal hernia with GERD without esophagitis 06/18/2017   Need for influenza vaccination 02/17/2017   Ventral hernia 01/24/2016   Type 2 diabetes mellitus without complication, without long-term current use of insulin ([place]) 10/04/2015   Obesity (BMI 30-39.9) 10/04/2015   Hyperlipidemia associated with type 2 diabetes mellitus ([place]) 07/27/2015   Neuropathic pain of both legs 07/27/2015   Obesity (BMI 30.0-34.9) 12/24/2014   Genetic testing 11/01/2014   Constipation - functional 09/24/2014   Chemotherapy-induced peripheral neuropathy ([place]) 09/24/2014   Midline low back pain without sciatica 09/24/2014   Family history of colon cancer    Hilar adenopathy    Hypertension associated with diabetes ([place])    Right lower lobe pulmonary nodule      Current Outpatient Medications on File Prior to Visit  Medication Sig Dispense Refill   Accu-Chek Softclix Lancets lancets TEST BLOOD SUGAR THREE TIMES DAILY 300 each 0   albuterol (VENTOLIN HFA) 108 (90 Base) MCG/ACT inhaler Inhale 2 puffs into the lungs every 6 (six) hours as needed for wheezing or shortness of breath. 8 g 0   amLODipine (NORVASC) 10 MG tablet Take 1 tablet (10 mg total) by mouth daily. 90 tablet 3   aspirin-sod bicarb-citric acid (ALKA-SELTZER) 325 MG TBEF tablet Take 650 mg by mouth every 6 (six) hours as needed (indigestion).     Blood Glucose Monitoring Suppl (ACCU-CHEK AVIVA PLUS) w/Device KIT USE AS DIRECTED 1 kit 0   buPROPion (WELLBUTRIN SR) 150 MG 12 hr tablet Take 1 tablet  (150 mg total) by mouth 2 (two) times daily. 60 tablet 0   dorzolamide-timolol (COSOPT) 22.3-6.8 MG/ML ophthalmic solution Place 1 drop into both eyes 2 (two) times daily.     DROPLET PEN NEEDLES 29G X 12MM MISC USE AS DIRECTED 100 each 0   gabapentin (NEURONTIN) 600 MG tablet Take 1 tablet (600 mg total) by mouth 3 (three) times daily. 270 tablet 2   glucose blood (ACCU-CHEK AVIVA PLUS) test strip TEST BLOOD SUGAR AS DIRECTED 300 strip 0   hydrALAZINE (APRESOLINE) 25 MG tablet TAKE 1 AND 1/2 TABLETS THREE TIMES DAILY 405 tablet 3   Lancets Misc. (ACCU-CHEK SOFTCLIX LANCET DEV) KIT Check blood sugars 3 times a day. 3 kit 6   LANTUS SOLOSTAR 100 UNIT/ML Solostar Pen INJECT 30 UNITS DAILY. INCREASE BY 2 UNITS EVERY 3 DAYS UNTIL MORNING SUGAR IS LESS THAN 130. MAX OF 40 UNITS. (Patient taking differently: Inject 20 Units into the skin at bedtime.) 45 mL 1   latanoprost (  XALATAN) 0.005 % ophthalmic solution Place 1 drop into both eyes at bedtime.     levocetirizine (XYZAL) 5 MG tablet Take 1 tablet (5 mg total) by mouth every evening. 30 tablet 0   losartan (COZAAR) 50 MG tablet Take 1 tablet (50 mg total) by mouth daily. 90 tablet 3   metoprolol tartrate (LOPRESSOR) 25 MG tablet TAKE 1/2 TABLET TWICE DAILY 90 tablet 1   montelukast (SINGULAIR) 10 MG tablet Take 1 tablet (10 mg total) by mouth at bedtime. 30 tablet 0   omeprazole (PRILOSEC) 20 MG capsule Take 1 capsule (20 mg total) by mouth daily. 30 capsule 3   rosuvastatin (CRESTOR) 20 MG tablet TAKE 1 TABLET EVERY DAY 90 tablet 0   solifenacin (VESICARE) 5 MG tablet TAKE 1 TABLET EVERY DAY 90 tablet 0   tirzepatide (MOUNJARO) 12.5 MG/0.5ML Pen Inject 12.5 mg into the skin once a week. 2 mL 0   traZODone (DESYREL) 100 MG tablet Take 1 tablet (100 mg total) by mouth at bedtime as needed for sleep. 30 tablet 0   vitamin B-12 (CYANOCOBALAMIN) 500 MCG tablet Take 1 tablet (500 mcg total) by mouth daily.     No current facility-administered medications  on file prior to visit.    Allergies  Allergen Reactions   Emend [Aprepitant] Other (See Comments)    Urticaria    Lisinopril Cough    Social History   Socioeconomic History   Marital status: Married    Spouse name: Not on file   Number of children: 2   Years of education: Not on file   Highest education level: High school graduate  Occupational History   Occupation: disability  Tobacco Use   Smoking status: Never   Smokeless tobacco: Never  Vaping Use   Vaping Use: Never used  Substance and Sexual Activity   Alcohol use: No   Drug use: No   Sexual activity: Not on file  Other Topics Concern   Not on file  Social History Narrative   No issues with transportation.    Attends church   Social Determinants of Health   Financial Resource Strain: Not on file  Food Insecurity: No Food Insecurity (05/15/2022)   Hunger Vital Sign    Worried About Running Out of Food in the Last Year: Never true    Ran Out of Food in the Last Year: Never true  Transportation Needs: No Transportation Needs (05/15/2022)   PRAPARE - Hydrologist (Medical): No    Lack of Transportation (Non-Medical): No  Physical Activity: Not on file  Stress: Not on file  Social Connections: Not on file  Intimate Partner Violence: Not on file    Family History  Problem Relation Age of Onset   Hypertension Mother    Hyperlipidemia Mother    Stroke Mother    Thyroid disease Mother    Hypertension Father    Diabetes Father    Hyperlipidemia Father    Sudden death Father    Cancer Sister 28       fibrosarcoma (back); currently 47   Prostate cancer Maternal Uncle    Colon cancer Paternal Aunt        Dx 42s; deceased 38s   Prostate cancer Paternal Uncle        currently 19   Cancer Paternal Uncle 81       "bone" ; unk. primary   Stomach cancer Paternal Uncle    Hypercalcemia Neg Hx  Past Surgical History:  Procedure Laterality Date   ABDOMINAL HYSTERECTOMY      FOOT SURGERY  04/2018   [facility]   INCISIONAL HERNIA REPAIR N/A 07/06/2015   Procedure: INCISIONAL HERNIA REPAIR ;  Surgeon: Rolm Bookbinder, MD;  Location: [facility];  Service: General;  Laterality: N/A;   [place] N/A 07/03/2021   Procedure: Fatima Blank HERNIA REPAIR WITH MESH;  Surgeon: Ralene Ok, MD;  Location: [place] [facility];  Service: General;  Laterality: N/A;   INSERTION OF MESH N/A 07/06/2015   Procedure: WITH INSERTION OF PHASIX ST MESH;  Surgeon: Rolm Bookbinder, MD;  Location: [facility];  Service: General;  Laterality: N/A;   LAPAROTOMY N/A 07/06/2015   Procedure: EXPLORATORY LAPAROTOMY;  Surgeon: Everitt Amber, MD;  Location: [facility];  Service: Gynecology;  Laterality: N/A;   LYSIS OF ADHESION N/A 07/06/2015   Procedure:  LYSIS OF ADHESION RESECTION OF MESENTERIC MASS BOWEL RESECTION ;  Surgeon: Everitt Amber, MD;  Location: [facility];  Service: Gynecology;  Laterality: N/A;   LYSIS OF ADHESION N/A 07/03/2021   Procedure: EXPLORATORY LAPAROTOMY WITH LYSIS OF ADHESION;  Surgeon: Ralene Ok, MD;  Location: [place];  Service: General;  Laterality: N/A;   ROBOTIC ASSISTED TOTAL HYSTERECTOMY WITH BILATERAL SALPINGO OOPHERECTOMY Bilateral 07/28/2014   Procedure: ROBOTIC ASSISTED lysis of adhesions with biopsies, converted to LAPAROTOMY, bilateral salpingoorphorectomy, omentectomy,appendectomy;  Surgeon: Janie Morning, MD;  Location: [facility];  Service: Gynecology;  Laterality: Bilateral;   THYROIDECTOMY, PARTIAL     VIDEO BRONCHOSCOPY N/A 01/13/2020   Procedure: VIDEO BRONCHOSCOPY WITH BIOPSIES;  Surgeon: Melrose Nakayama, MD;  Location: [facility];  Service: Thoracic;  Laterality: N/A;    ROS: Review of Systems Negative except as stated above  PHYSICAL EXAM: BP 126/85 (BP Location: Left Arm, Patient Position: Sitting, Cuff Size: Normal)   Pulse 74   Temp 97.9 F (36.6 C) (Oral)   Ht _0  (1.651 m)   Wt 205 lb (93 kg)   LMP  (LMP Unknown)   SpO2 97%   BMI 34.11 kg/m   Wt  Readings from Last 3 Encounters:  05/17/22 205 lb (93 kg)  05/14/22 199 lb (90.3 kg)  04/29/22 198 lb (89.8 kg)    Physical Exam  General appearance - alert, well appearing, and in no distress Mental status - normal mood, behavior, speech, dress, motor activity, and thought processes Neck - supple, no significant adenopathy Chest - clear to auscultation, no wheezes, rales or rhonchi, symmetric air entry Heart - normal rate, regular rhythm, normal S1, S2, no murmurs, rubs, clicks or gallops Musculoskeletal -mild tenderness on palpation of the mid to lower thoracic spine and upper lumbar spine and surrounding paraspinal muscles.  Gait appears normal.  Good strength in her lower extremities. Extremities -no lower extremity edema.      Latest Ref Rng & Units 03/28/2022    3:08 PM 01/09/2022    8:55 AM 07/04/2021   12:59 AM  CMP  Glucose 70 - 99 mg/dL 81  80  120   BUN 6 - 20 mg/dL _1 Creatinine 0.44 - 1.00 mg/dL 1.32  1.44  1.23   Sodium 135 - 145 mmol/L 139  142  132   Potassium 3.5 - 5.1 mmol/L 3.7  4.2  4.4   Chloride 98 - 111 mmol/L 106  103  103   CO2 22 - 32 mmol/L _2 Calcium 8.9 - 10.3 mg/dL 11.3  12.1  10.5   Total Protein 6.0 - 8.5 g/dL  7.4    Total Bilirubin 0.0 - 1.2 mg/dL  0.5    Alkaline Phos 44 - 121 IU/L  74    AST 0 - 40 IU/L  26    ALT 0 - 32 IU/L  29     Lipid Panel     Component Value Date/Time   CHOL 167 01/09/2022 0855   TRIG 192 (H) 01/09/2022 0855   HDL 45 01/09/2022 0855   CHOLHDL 3.7 01/09/2022 0855   CHOLHDL 5.1 (H) 08/14/2016 1729   VLDL 74 (H) 08/14/2016 1729   LDLCALC 89 01/09/2022 0855    CBC    Component Value Date/Time   WBC 5.4 03/28/2022 1508   RBC 4.97 03/28/2022 1508   HGB 15.0 03/28/2022 1508   HGB 13.3 02/03/2020 0837   HGB 13.6 12/04/2018 0914   HGB 12.9 04/22/2016 0750   HCT 46.8 (H) 03/28/2022 1508   HCT 41.0 12/04/2018 0914   HCT 39.0 04/22/2016 0750   PLT 318 03/28/2022 1508   PLT 270 02/03/2020  0837   PLT 332 12/04/2018 0914   MCV 94.2 03/28/2022 1508   MCV 88 12/04/2018 0914   MCV 92.2 04/22/2016 0750   MCH 30.2 03/28/2022 1508   MCHC 32.1 03/28/2022 1508   RDW 13.6 03/28/2022 1508   RDW 13.6 12/04/2018 0914   RDW 13.9 04/22/2016 0750   LYMPHSABS 2.4 03/28/2022 1508   LYMPHSABS 2.7 04/22/2016 0750   MONOABS 0.8 03/28/2022 1508   MONOABS 1.0 (H) 04/22/2016 0750   EOSABS 0.1 03/28/2022 1508   EOSABS 0.1 04/22/2016 0750   BASOSABS 0.0 03/28/2022 1508   BASOSABS 0.0 04/22/2016 0750    ASSESSMENT AND PLAN:  1. Type 2 diabetes mellitus with obesity ([place]) Well-controlled.  I recommend decreasing metformin to 500 mg twice a day.  Continue Lantus insulin 20 units and Mounjaro weekly injection. Encouraged her to continue with regular low-impact exercise. Encouraged her to be mindful of her eating habits during the holidays. - POCT glucose (manual entry) - POCT glycosylated hemoglobin (Hb A1C) - Continuous Blood Gluc Sensor (FREESTYLE LIBRE SENSOR SYSTEM) MISC; Change sensor Q 2 wks  Dispense: 2 each; Refill: 12 - Microalbumin / creatinine urine ratio - metFORMIN (GLUCOPHAGE) 500 MG tablet; Take 1 tablet (500 mg total) by mouth daily with breakfast.  Dispense: 90 tablet; Refill: 1  2. Hypertension associated with diabetes ([place]) Systolic blood pressure slightly above goal. Continue amlodipine 10 mg daily, hydralazine 25 mg 1-1/2 tablets 3 times a day, Cozaar 50 mg daily, metoprolol 25 mg half a tablet twice a day  3. Hyperlipidemia associated with type 2 diabetes mellitus ([facility]) Continue Crestor 20 mg daily  4. Atherosclerosis of aorta ([facility]) Seen in the 3 above  5. History of ovarian cancer No signs of recurrence on last PET scan done about 2 years ago.  6. Chemotherapy-induced peripheral neuropathy ([facility]) Stable on gabapentin  7. Chronic bilateral thoracic back pain Likely degenerative arthritis versus spinal stenosis. Will get x-rays of the thoracic spine. Continue  Robaxin as needed. Add Lidoderm patch. May consider advanced imaging if no improvement with these measures. - methocarbamol (ROBAXIN) 500 MG tablet; Take 1 tablet (500 mg total) by mouth 2 (two) times daily as needed for muscle spasms.  Dispense: 40 tablet; Refill: 3 - lidocaine (LIDODERM) 5 %; Place 1 patch onto the skin daily. Remove & Discard patch within 12 hours or as directed by MD  Dispense: 30 patch; Refill:  1  8. Chronic bilateral low back pain without sciatica See #7 above. - DG Thoracic Spine W/Swimmers; Future - methocarbamol (ROBAXIN) 500 MG tablet; Take 1 tablet (500 mg total) by mouth 2 (two) times daily as needed for muscle spasms.  Dispense: 40 tablet; Refill: 3 - lidocaine (LIDODERM) 5 %; Place 1 patch onto the skin daily. Remove & Discard patch within 12 hours or as directed by MD  Dispense: 30 patch; Refill: 1    Patient was given the opportunity to ask questions.  Patient verbalized understanding of the plan and was able to repeat key elements of the plan.   This documentation was completed using Radio producer.  Any transcriptional errors are unintentional.  Orders Placed This Encounter  Procedures   DG Thoracic Spine W/Swimmers   Microalbumin / creatinine urine ratio   POCT glucose (manual entry)   POCT glycosylated hemoglobin (Hb A1C)     Requested Prescriptions   Signed Prescriptions Disp Refills   Continuous Blood Gluc Sensor (FREESTYLE LIBRE SENSOR SYSTEM) MISC 2 each 12    Sig: Change sensor Q 2 wks   methocarbamol (ROBAXIN) 500 MG tablet 40 tablet 3    Sig: Take 1 tablet (500 mg total) by mouth 2 (two) times daily as needed for muscle spasms.   lidocaine (LIDODERM) 5 % 30 patch 1    Sig: Place 1 patch onto the skin daily. Remove & Discard patch within 12 hours or as directed by MD   metFORMIN (GLUCOPHAGE) 500 MG tablet 90 tablet 1    Sig: Take 1 tablet (500 mg total) by mouth daily with breakfast.    Return in about 4 months  (around 09/16/2022) for Give appt with Lurena Joiner or [place] for Medicare Wellness Visit after 06/26/2022.  Karle Plumber, MD, FACP

## 2022-05-17 NOTE — Patient Instructions (Signed)
Decrease metformin to 500 mg daily.  Stop downstairs to [facility] imaging to have the x-ray done of your back.  Prescription sent to your pharmacy for Lidoderm patch and refill on the muscle relaxant called Robaxin.

## 2022-05-18 DIAGNOSIS — E213 Hyperparathyroidism, unspecified: Secondary | ICD-10-CM | POA: Insufficient documentation

## 2022-05-19 LAB — MICROALBUMIN / CREATININE URINE RATIO
Creatinine, Urine: 147.4 mg/dL
Microalb/Creat Ratio: 31 mg/g creat — ABNORMAL HIGH (ref 0–29)
Microalbumin, Urine: 46.3 ug/mL

## 2022-05-20 ENCOUNTER — Ambulatory Visit
Admission: RE | Admit: 2022-05-20 | Discharge: 2022-05-20 | Disposition: A | Discharge status: Home / Self Care | Payer: Medicare HMO | Source: Ambulatory Visit | Attending: Internal Medicine | Admitting: Internal Medicine

## 2022-05-20 DIAGNOSIS — H524 Presbyopia: Secondary | ICD-10-CM | POA: Diagnosis not present

## 2022-05-20 DIAGNOSIS — H52223 Regular astigmatism, bilateral: Secondary | ICD-10-CM | POA: Diagnosis not present

## 2022-05-20 DIAGNOSIS — M546 Pain in thoracic spine: Secondary | ICD-10-CM | POA: Diagnosis not present

## 2022-05-20 DIAGNOSIS — H5213 Myopia, bilateral: Secondary | ICD-10-CM | POA: Diagnosis not present

## 2022-05-20 DIAGNOSIS — M545 Low back pain, unspecified: Secondary | ICD-10-CM

## 2022-05-21 ENCOUNTER — Other Ambulatory Visit (INDEPENDENT_AMBULATORY_CARE_PROVIDER_SITE_OTHER): Payer: Self-pay | Admitting: Family Medicine

## 2022-05-21 DIAGNOSIS — E669 Obesity, unspecified: Secondary | ICD-10-CM

## 2022-05-21 DIAGNOSIS — H52209 Unspecified astigmatism, unspecified eye: Secondary | ICD-10-CM | POA: Diagnosis not present

## 2022-05-21 DIAGNOSIS — H5213 Myopia, bilateral: Secondary | ICD-10-CM | POA: Diagnosis not present

## 2022-05-22 ENCOUNTER — Other Ambulatory Visit: Payer: Self-pay | Admitting: Internal Medicine

## 2022-05-22 DIAGNOSIS — I10 Essential (primary) hypertension: Secondary | ICD-10-CM

## 2022-05-27 NOTE — Progress Notes (Unsigned)
Chief Complaint:   OBESITY Sarah [name] is here to discuss her progress with her obesity treatment plan along with follow-up of her obesity related diagnoses. Sarah [name] is on the Category 1 Plan and states she is following her eating plan approximately 65% of the time. Sarah [name] states she is going to the gym 45 minutes 3 times per week.  Today's visit was #: 62 Starting weight: 228 lbs Starting date: 02/15/2020 Today's weight: 199 lbs Today's date: 05/14/2022 Total lbs lost to date: 29 lbs Total lbs lost since last in-office visit: 0  Interim History: Sarah [name] has done well overall with weight loss.  Sleeping well at times and not always hungry.  Discussed the importance of not skipping meals and how this will decrease RMR and make weight loss more difficult.  Subjective:   1. Type 2 diabetes mellitus with obesity ([place]) Sarah [name] is taking Lantus insulin, metformin and Mounjaro without any side effects.  CBG 88-140's.  No hypoglycemia.  Sees Endo in January 2023.  2. Constipation, unspecified constipation type Sarah [name] has had a decrease in stool frequency--taking stool softeners and discussed can also take MiraLAX 17 g daily.  3. Gastroesophageal reflux disease, unspecified whether esophagitis present Reports some heartburn after eating in early morning.  Taking Alka-Seltzer and helps some.  4. Mood disorder ([place]) with emotional eating Sarah [name] is taking Wellbutrin SR 150 Mg twice a day without any side effects.  Reports helps to stabilize mood. Denies suicidal ideas, and homicidal ideas.  Assessment/Plan:   1. Type 2 diabetes mellitus with obesity ([place]) Follow-up with Endo for or continue Lantus insulin/metformin and Mounjaro.  Continue healthy eating plan and no skipping meals.  2. Constipation, unspecified constipation type MiraLAX 17 g daily for constipation and increase fiber in diet.  Continue healthy eating plan.  3. Gastroesophageal reflux disease, unspecified whether esophagitis  present We will refill Prilosec 20 mg daily for 1 month with 0 refills. Continue healthy eating plan and exercise.  -Refill omeprazole (PRILOSEC) 20 MG capsule; Take 1 capsule (20 mg total) by mouth daily.  Dispense: 30 capsule; Refill: 3  4. Mood disorder ([place]) with emotional eating Continue Wellbutrin, exercise, healthy eating plan.  5. Obesity, Current BMI 33.2 Sarah [name] is currently in the action stage of change. As such, her goal is to continue with weight loss efforts. She has agreed to the Category 1 Plan.   Exercise goals: AS is.  Behavioral modification strategies: increasing lean protein intake, no skipping meals, emotional eating strategies, and holiday eating strategies .  Sarah [name] has agreed to follow-up with our clinic in 2 weeks. She was informed of the importance of frequent follow-up visits to maximize her success with intensive lifestyle modifications for her multiple health conditions.   Objective:   Blood pressure 115/78, pulse 76, temperature 98.5 F (36.9 C), height '5\' 5"'$  (1.651 m), weight 199 lb (90.3 kg), SpO2 100 %. Body mass index is 33.12 kg/m.  General: Cooperative, alert, well developed, in no acute distress. HEENT: Conjunctivae and lids unremarkable. Cardiovascular: Regular rhythm.  Lungs: Normal work of breathing. Neurologic: No focal deficits.   Lab Results  Component Value Date   CREATININE 1.32 (H) 03/28/2022   BUN 19 03/28/2022   NA 139 03/28/2022   K 3.7 03/28/2022   CL 106 03/28/2022   CO2 26 03/28/2022   Lab Results  Component Value Date   ALT 29 01/09/2022   AST 26 01/09/2022   ALKPHOS 74 01/09/2022   BILITOT 0.5 01/09/2022   Lab Results  Component Value Date   HGBA1C 5.5 05/17/2022   HGBA1C 5.5 01/09/2022   HGBA1C 5.7 10/04/2021   HGBA1C 6.2 (H) 06/26/2021   HGBA1C 7.0 (H) 03/07/2021   No results found for: "INSULIN" Lab Results  Component Value Date   TSH 0.938 02/15/2020   Lab Results  Component Value Date   CHOL 167  01/09/2022   HDL 45 01/09/2022   LDLCALC 89 01/09/2022   TRIG 192 (H) 01/09/2022   CHOLHDL 3.7 01/09/2022   Lab Results  Component Value Date   VD25OH 35.7 10/22/2021   VD25OH 99.6 06/26/2021   VD25OH 81.1 03/07/2021   Lab Results  Component Value Date   WBC 5.4 03/28/2022   HGB 15.0 03/28/2022   HCT 46.8 (H) 03/28/2022   MCV 94.2 03/28/2022   PLT 318 03/28/2022   Lab Results  Component Value Date   IRON 43 08/18/2010   TIBC 389 08/18/2010   FERRITIN 227 08/18/2010   Attestation Statements:   Reviewed by clinician on day of visit: allergies, medications, problem list, medical history, surgical history, family history, social history, and previous encounter notes.  I, Brendell Tyus, am acting as transcriptionist for [employer], [employer].  I have reviewed the above documentation for accuracy and completeness, and I agree with the above. -  ***

## 2022-05-28 ENCOUNTER — Encounter (INDEPENDENT_AMBULATORY_CARE_PROVIDER_SITE_OTHER): Payer: Self-pay | Admitting: Family Medicine

## 2022-05-28 ENCOUNTER — Ambulatory Visit (INDEPENDENT_AMBULATORY_CARE_PROVIDER_SITE_OTHER): Payer: Medicare HMO | Admitting: Family Medicine

## 2022-05-28 VITALS — BP 110/58 | HR 65 | Temp 97.5°F | Ht 65.0 in | Wt 200.8 lb

## 2022-05-28 DIAGNOSIS — F39 Unspecified mood [affective] disorder: Secondary | ICD-10-CM

## 2022-05-28 DIAGNOSIS — Z794 Long term (current) use of insulin: Secondary | ICD-10-CM

## 2022-05-28 DIAGNOSIS — E1169 Type 2 diabetes mellitus with other specified complication: Secondary | ICD-10-CM | POA: Diagnosis not present

## 2022-05-28 DIAGNOSIS — E669 Obesity, unspecified: Secondary | ICD-10-CM | POA: Diagnosis not present

## 2022-05-28 DIAGNOSIS — Z8719 Personal history of other diseases of the digestive system: Secondary | ICD-10-CM

## 2022-05-28 DIAGNOSIS — Z9109 Other allergy status, other than to drugs and biological substances: Secondary | ICD-10-CM | POA: Diagnosis not present

## 2022-05-28 DIAGNOSIS — Z6833 Body mass index (BMI) 33.0-33.9, adult: Secondary | ICD-10-CM | POA: Diagnosis not present

## 2022-05-28 DIAGNOSIS — K219 Gastro-esophageal reflux disease without esophagitis: Secondary | ICD-10-CM | POA: Diagnosis not present

## 2022-05-28 MED ORDER — TIRZEPATIDE 12.5 MG/0.5ML ~~LOC~~ SOAJ: 12.5000 mg | SUBCUTANEOUS | 0 refills | Status: DC

## 2022-05-28 MED ORDER — MONTELUKAST SODIUM 10 MG PO TABS: 10.0000 mg | ORAL_TABLET | Freq: Every day | ORAL | 0 refills | Status: DC

## 2022-05-28 MED ORDER — BUPROPION HCL ER (SR) 150 MG PO TB12: 150.0000 mg | ORAL_TABLET | Freq: Two times a day (BID) | ORAL | 0 refills | Status: DC

## 2022-05-28 MED ORDER — LEVOCETIRIZINE DIHYDROCHLORIDE 5 MG PO TABS: 5.0000 mg | ORAL_TABLET | Freq: Every evening | ORAL | 0 refills | Status: DC

## 2022-06-07 DIAGNOSIS — H2513 Age-related nuclear cataract, bilateral: Secondary | ICD-10-CM | POA: Diagnosis not present

## 2022-06-07 DIAGNOSIS — E119 Type 2 diabetes mellitus without complications: Secondary | ICD-10-CM | POA: Diagnosis not present

## 2022-06-07 DIAGNOSIS — Z794 Long term (current) use of insulin: Secondary | ICD-10-CM | POA: Diagnosis not present

## 2022-06-07 DIAGNOSIS — H401133 Primary open-angle glaucoma, bilateral, severe stage: Secondary | ICD-10-CM | POA: Diagnosis not present

## 2022-06-11 ENCOUNTER — Encounter (INDEPENDENT_AMBULATORY_CARE_PROVIDER_SITE_OTHER): Payer: Self-pay | Admitting: Physician Assistant

## 2022-06-11 ENCOUNTER — Ambulatory Visit (INDEPENDENT_AMBULATORY_CARE_PROVIDER_SITE_OTHER): Payer: Medicare HMO | Admitting: Physician Assistant

## 2022-06-11 VITALS — BP 127/81 | HR 56 | Temp 97.5°F | Ht 65.0 in | Wt 200.0 lb

## 2022-06-11 DIAGNOSIS — Z7985 Long-term (current) use of injectable non-insulin antidiabetic drugs: Secondary | ICD-10-CM | POA: Diagnosis not present

## 2022-06-11 DIAGNOSIS — Z6833 Body mass index (BMI) 33.0-33.9, adult: Secondary | ICD-10-CM

## 2022-06-11 DIAGNOSIS — F39 Unspecified mood [affective] disorder: Secondary | ICD-10-CM

## 2022-06-11 DIAGNOSIS — E1169 Type 2 diabetes mellitus with other specified complication: Secondary | ICD-10-CM | POA: Diagnosis not present

## 2022-06-11 DIAGNOSIS — E669 Obesity, unspecified: Secondary | ICD-10-CM | POA: Diagnosis not present

## 2022-06-11 DIAGNOSIS — K5900 Constipation, unspecified: Secondary | ICD-10-CM

## 2022-06-11 DIAGNOSIS — Z7984 Long term (current) use of oral hypoglycemic drugs: Secondary | ICD-10-CM

## 2022-06-11 MED ORDER — TIRZEPATIDE 12.5 MG/0.5ML ~~LOC~~ SOAJ: 12.5000 mg | SUBCUTANEOUS | 0 refills | Status: DC

## 2022-06-12 ENCOUNTER — Other Ambulatory Visit: Payer: Self-pay | Admitting: Internal Medicine

## 2022-06-14 ENCOUNTER — Ambulatory Visit
Admission: RE | Admit: 2022-06-14 | Discharge: 2022-06-14 | Disposition: A | Discharge status: Home / Self Care | Payer: Medicare HMO | Source: Ambulatory Visit | Attending: Internal Medicine | Admitting: Internal Medicine

## 2022-06-14 DIAGNOSIS — Z1231 Encounter for screening mammogram for malignant neoplasm of breast: Secondary | ICD-10-CM

## 2022-06-19 ENCOUNTER — Encounter: Payer: Self-pay | Admitting: Internal Medicine

## 2022-06-21 ENCOUNTER — Ambulatory Visit: Payer: Medicare HMO | Admitting: Internal Medicine

## 2022-06-21 ENCOUNTER — Encounter: Payer: Self-pay | Admitting: Internal Medicine

## 2022-06-21 DIAGNOSIS — E042 Nontoxic multinodular goiter: Secondary | ICD-10-CM

## 2022-06-21 DIAGNOSIS — E1165 Type 2 diabetes mellitus with hyperglycemia: Secondary | ICD-10-CM

## 2022-06-21 DIAGNOSIS — Z794 Long term (current) use of insulin: Secondary | ICD-10-CM | POA: Diagnosis not present

## 2022-06-21 NOTE — Progress Notes (Addendum)
Patient ID: Sarah [name], female   DOB: 07/27/1965, 57 y.o.   MRN: [id number]  HPI  Sarah [name] is a 57 y.o.-year-old female, returning for follow-up for hypercalcemia/hyperparathyroidism, also thyroid nodules and controlled DM2.  She previously saw [name], last visit with him was a video visit almost 2 years ago.  Interim history: She c/o constipation, but no other complaints. Since last visit with [name], she establish care with the Weight management clinic.  Hypercalcemia:  Pt was dx with hypercalcemia in 2020. I reviewed pt's pertinent labs: Lab Results  Component Value Date   PTH 26 07/26/2020   PTH 26 04/29/2019   PTH Comment 04/29/2019   PTH 21 03/22/2019   PTH Comment 03/22/2019   Lab Results  Component Value Date   CALCIUM 11.3 (H) 03/28/2022   CALCIUM 12.1 (H) 01/09/2022   CALCIUM 10.5 (H) 07/04/2021   CALCIUM 11.3 (H) 06/26/2021   CALCIUM 12.1 (H) 05/28/2021   CALCIUM 12.3 (H) 03/07/2021   CALCIUM 11.5 (H) 11/29/2020   CALCIUM 11.7 (H) 07/26/2020   CALCIUM 11.9 (H) 04/17/2020   CALCIUM 10.7 (H) 01/11/2020   CALCIUM 11.7 (H) 12/29/2019   CALCIUM 10.9 (H) 10/06/2019   CALCIUM 10.6 (H) 09/29/2019   CALCIUM 12.0 (H) 09/23/2019   CALCIUM 10.5 (H) 06/16/2019   CALCIUM 11.1 (H) 04/29/2019   CALCIUM 10.9 (H) 03/22/2019   CALCIUM 10.6 (H) 12/04/2018   CALCIUM 10.4 (H) 11/23/2018   CALCIUM 10.0 08/28/2018   CALCIUM 9.7 11/17/2017   CALCIUM 9.8 04/22/2016   CALCIUM 10.1 01/22/2016   CALCIUM 10.3 10/02/2015   CALCIUM 8.9 07/10/2015   CALCIUM 9.2 07/09/2015   CALCIUM 9.3 07/08/2015   CALCIUM 9.4 07/07/2015   CALCIUM 9.5 07/03/2015   CALCIUM 9.9 04/27/2015   CALCIUM 10.1 03/27/2015   CALCIUM 8.9 12/22/2014   CALCIUM 9.5 12/08/2014   CALCIUM 9.2 11/24/2014   CALCIUM 9.0 11/17/2014   CALCIUM 8.7 11/03/2014   CALCIUM 10.5 (H) 10/27/2014   CALCIUM 8.4 10/18/2014   CALCIUM 9.3 10/03/2014   CALCIUM 9.4 09/22/2014   CALCIUM 10.3 09/15/2014   CALCIUM  9.2 09/08/2014   CALCIUM 9.5 09/01/2014   CALCIUM 9.7 08/19/2014   CALCIUM 9.1 07/31/2014   CALCIUM 9.4 07/30/2014   CALCIUM 8.5 07/29/2014   CALCIUM 10.4 07/08/2014   CALCIUM 8.3 (L) 06/29/2014   CALCIUM 8.8 06/28/2014   A 24-hour urine calcium was normal (no creatinine associated with the reading): Component     Latest Ref Rng 08/16/2020  Calcium, 24H Urine     mg/24 h 81    She does not take calcium supplements.  No fractures or falls or evidence of osteoporosis.   No h/o kidney stones.  She has CKD stage III. Last BUN/Cr: Lab Results  Component Value Date   BUN 19 03/28/2022   BUN 18 01/09/2022   CREATININE 1.32 (H) 03/28/2022   CREATININE 1.44 (H) 01/09/2022   Pt was previously on HCTZ, which was stopped by [name].  She is not on vitamin A.  She has a h/o vitamin D deficiency. Reviewed vit D levels: Lab Results  Component Value Date   VD25OH 35.7 10/22/2021   VD25OH 99.6 06/26/2021   VD25OH 81.1 03/07/2021   VD25OH 60.6 11/29/2020   VD25OH 41.54 07/26/2020   VD25OH 44.6 04/17/2020   VD25OH 38.2 02/15/2020   VD25OH 10.2 (L) 05/14/2019  She is not taking vitamin D  - off x 1 year (was on Ergocalciferol).  Calcitriol, vit A and  PTH-rp levels were normal: Component     Latest Ref Rng 03/22/2019 04/29/2019 07/26/2020  Vitamin D 1, 25 (OH) Total     18 - 72 pg/mL   24   Vitamin D3 1, 25 (OH)     pg/mL   <8   Vitamin D2 1, 25 (OH)     pg/mL   24   PTH-related peptide     pmol/L <2.0  <2.0    Vitamin A (Retinoic Acid)     38 - 98 mcg/dL   73   PTH-Related Protein (PTH-RP)     11 - 20 pg/mL   17    Magnesium was slightly low at last check: Lab Results  Component Value Date   MG 1.5 (L) 01/09/2022   MG 1.6 11/29/2020   MG 1.7 07/12/2015   MG 1.9 07/11/2015   MG 2.0 07/10/2015   MG 2.2 07/09/2015   MG 2.3 07/08/2015   MG 1.9 08/19/2010   No results found for: "PHOS"  Of note, patient has a history of increased free light chains - she had bone  marrow biopsy for this and was seen by hematology but no further workup is needed for this and no follow-up was recommended.  Also, on the chest CT scan from 07/11/2020, 1.1 cm right lower lobe nodule appears to be mildly progressive, with slow growth, but without increased uptake on the PET scan and dotatate scan.  The radiologist suggested that this may be an indolent neoplasm such as pulmonary carcinoid.  Pt does not have a FH of hypercalcemia, pituitary tumors, thyroid cancer, or osteoporosis.  Possible FH of kidney stones in father.  Thyroid nodules: Patient was found to have several thyroid nodules:  Thyroid U/S (08/17/2010): Right thyroid lobe:  Measures 5.1 x 1.5 x 1.8 cm.  Heterogeneous echogenicity.  Left thyroid lobe:  Measures 8.6 x 3.6 x 4.5 cm.  Heterogeneous echogenicity.  Isthmus: Measures 1.3 cm.   Focal nodules:  There are multiple bilateral thyroid nodules:   The largest lesion in the right lobe measures 1.4 x 0.8 x 1.0 cm and is in the upper pole region.   There is a large lesion in the isthmus which measures 4.0 x 1.5 x 2.5 cm.   The left lobe is occupied by to large complex thyroid nodules which  are largely solid but have cystic components.  The largest lesion  measures 4.8 x 3.7 x 4.1 cm and is in the lower aspect of the lobe.   The adjacent lesion measures 4.1 x 2.5 x 4.0 cm.  Fine-needle  aspiration/biopsy of the two left lobe lesions are suggested to  exclude malignancy.   Lymphadenopathy:  None visualized.   IMPRESSION:   Multinodular thyroid goiter.  The largest lesions in the left  lower lobe are complex and fine-needle aspiration/biopsy is  suggested.  The other lesions can be followed depending on the  pathology.   FNA of the 2 L thyroid nodules (08/29/2010): Both benign  She had left thyroidectomy + isthmusectomy (10/23/2010):  Thyroid, lobectomy, left with isthmus - ADENOMATOUS GOITER. - THERE IS NO EVIDENCE OF MALIGNANCY.  Grossly, there are  two dominant lesions present. The larger measures 6.2 cm and is present in the left lobe. There is a smaller nodule present in the isthmus, spanning 2.2 cm. Histologic evaluation of both lesions reveals mixed macro and microfollicular adenomas. Malignant features are not identified. (JK:mw 10-24-10)  Thyroid U/S (10/22/2019): 3 nodules, spongiform and solid/cystic nodular areas that did  not need follow-up: Parenchymal Echotexture: Moderately heterogenous  Isthmus: Surgically absent.  Right lobe: 6.2 x 2.1 x 2.8 cm  Left lobe: Surgically absent.  _________________________________________________________   Estimated total number of nodules >/= 1 cm: 3  _________________________________________________________   Nodule # 1:  Prior biopsy: No  Location: Right; Superior  Maximum size: 1.7 cm; Other 2 dimensions: 1.4 x 1.4 cm, previously, 1.4 cm  Composition: mixed cystic and solid (1)  Echogenicity: isoechoic (1) Change in features: Largely cystic and demonstrating interval cystic degeneration since 2012. Change in ACR TI-RADS risk category: Yes This nodule does NOT meet TI-RADS criteria for biopsy or dedicated follow-up.  _________________________________________________________   Nodule # 2:  Location: Right; Inferior  Maximum size: 2.1 cm; Other 2 dimensions: 1.2 x 1.3 cm  Composition: spongiform (0) This nodule does NOT meet TI-RADS criteria for biopsy or dedicated follow-up.  _________________________________________________________   Three other small vague areas were measured in the tip of the right lobe inferiorly which all appear to be adjacent/part of the same spongiform area referred to as nodule #2 above. Irregardless, these spongiform areas even if they were considered separate nodules do not meet criteria for biopsy or further follow-up.   No abnormal tissue is identified in the left neck at the level of prior thyroidectomy. No abnormal lymph nodes identified.    IMPRESSION: Residual right lobe of the thyroid demonstrates spongiform and solid/cystic nodular areas that do not meet criteria for biopsy or further follow-up.   Previous thyroid tests reviewed: Lab Results  Component Value Date   TSH 0.938 02/15/2020   TSH 0.977 04/29/2019   TSH 0.698 03/22/2019   TSH 0.75 08/14/2016   TSH 1.860 07/31/2014   TSH 1.867 12/10/2010   TSH 0.996 08/16/2010   Lab Results  Component Value Date   FREET4 1.29 02/15/2020   FREET4 1.01 12/10/2010   FREET4 1.07 08/16/2010   + FH of thyroid ds. In M and sister.  DM2: - dx'ed 2015-2016 at the time of her ovarian surgery - insulin soon after DM  Reviewed HbA1c levels: Lab Results  Component Value Date   HGBA1C 5.5 05/17/2022   HGBA1C 5.5 01/09/2022   HGBA1C 5.7 10/04/2021   HGBA1C 6.2 (H) 06/26/2021   HGBA1C 7.0 (H) 03/07/2021   HGBA1C 7.2 (H) 11/29/2020   HGBA1C 5.6 06/14/2020   HGBA1C 6.9 (H) 04/17/2020   HGBA1C 9.0 (H) 01/11/2020   HGBA1C 9.8 (A) 09/23/2019   HGBA1C 9.3 (A) 03/22/2019   HGBA1C 10.8 (H) 12/04/2018   HGBA1C 9.6 (H) 08/28/2018   HGBA1C 7.6 (A) 05/22/2018   HGBA1C 7.6 (A) 02/20/2018   HGBA1C 7.8 09/03/2017   HGBA1C 8.6 06/18/2017   HGBA1C 7.7 02/17/2017   HGBA1C 7.3 11/13/2016   HGBA1C 7.3 08/14/2016   She is on the following regimen: - Metformin 500 mg once a day, recently reduced from twice a day - Lantus 20 >> 10 units daily - Mounjaro 12.5 mg weekly  She is seen in the Weight management clinic. She is exercising 3 times a week at the [facility].  Pt checks her sugars 1-2x a day a day and they are: - am: <110 - 2h after b'fast: n/c - before lunch: n/c - 2h after lunch: n/c - before dinner: n/c - 2h after dinner: n/c - bedtime: <140 - nighttime: n/c Lowest sugar was 89; she has hypoglycemia awareness at 60.  Highest sugar was 148.  Glucometer: Accu-Chek  - + CKD, last BUN/creatinine:  Lab Results  Component Value Date  BUN 19 03/28/2022   BUN 18  01/09/2022   CREATININE 1.32 (H) 03/28/2022   CREATININE 1.44 (H) 01/09/2022  On Cozaar 50 mg daily.  -+ HL; last set of lipids: Lab Results  Component Value Date   CHOL 167 01/09/2022   HDL 45 01/09/2022   LDLCALC 89 01/09/2022   TRIG 192 (H) 01/09/2022   CHOLHDL 3.7 01/09/2022  On Crestor 20 mg daily.  - last eye exam was in 05/2022. No DR reportedly. + Glaucoma, cataracts.  - + numbness and tingling in her feet -due to chemotherapy >> improved.  On Neurontin.  Pt. also has a history of HTN, GERD, B12 deficiency, obesity class II, depression, and also history of ovarian cancer for which she had chemotherapy in the past, with subsequent peripheral neuropathy-on gabapentin.  Also, she was found to have aortic atherosclerosis on the CT scan from 07/2020.  ROS: + see HPI  Past Medical History:  Diagnosis Date   Arthritis    Diabetes mellitus without complication ([place])    Family history of adverse reaction to anesthesia    sister-n/v   Family history of colon cancer    GERD (gastroesophageal reflux disease)    Glaucoma 07/2014   Glucagonoma 07/28/2014   Pt denies this but states she has glaucoma   History of chemotherapy    History of hiatal hernia    Hypercalcemia    Hypertension    Imbalance    Neuropathy    feet bilat   Nocturia    Peritoneal carcinomatosis ([place])    carcinoma of ovary    Shortness of breath dyspnea    hx of 2013 - no problems currently    Sleep apnea    Thyroid nodule    history of    Wears glasses    Past Surgical History:  Procedure Laterality Date   ABDOMINAL HYSTERECTOMY     FOOT SURGERY  04/2018   [facility]   INCISIONAL HERNIA REPAIR N/A 07/06/2015   Procedure: INCISIONAL HERNIA REPAIR ;  Surgeon: Rolm Bookbinder, MD;  Location: [facility];  Service: General;  Laterality: N/A;   [place] N/A 07/03/2021   Procedure: Fatima Blank HERNIA REPAIR WITH MESH;  Surgeon: Ralene Ok, MD;  Location: [place];  Service: General;   Laterality: N/A;   INSERTION OF MESH N/A 07/06/2015   Procedure: WITH INSERTION OF PHASIX ST MESH;  Surgeon: Rolm Bookbinder, MD;  Location: [facility];  Service: General;  Laterality: N/A;   LAPAROTOMY N/A 07/06/2015   Procedure: EXPLORATORY LAPAROTOMY;  Surgeon: Everitt Amber, MD;  Location: [facility];  Service: Gynecology;  Laterality: N/A;   LYSIS OF ADHESION N/A 07/06/2015   Procedure:  LYSIS OF ADHESION RESECTION OF MESENTERIC MASS BOWEL RESECTION ;  Surgeon: Everitt Amber, MD;  Location: [facility];  Service: Gynecology;  Laterality: N/A;   LYSIS OF ADHESION N/A 07/03/2021   Procedure: EXPLORATORY LAPAROTOMY WITH LYSIS OF ADHESION;  Surgeon: Ralene Ok, MD;  Location: [place];  Service: General;  Laterality: N/A;   ROBOTIC ASSISTED TOTAL HYSTERECTOMY WITH BILATERAL SALPINGO OOPHERECTOMY Bilateral 07/28/2014   Procedure: ROBOTIC ASSISTED lysis of adhesions with biopsies, converted to LAPAROTOMY, bilateral salpingoorphorectomy, omentectomy,appendectomy;  Surgeon: Janie Morning, MD;  Location: [facility];  Service: Gynecology;  Laterality: Bilateral;   THYROIDECTOMY, PARTIAL     VIDEO BRONCHOSCOPY N/A 01/13/2020   Procedure: VIDEO BRONCHOSCOPY WITH BIOPSIES;  Surgeon: Melrose Nakayama, MD;  Location: [place];  Service: Thoracic;  Laterality: N/A;   Social History  Socioeconomic History   Marital status: Married    Spouse name: Not on file   Number of children: 2   Years of education: Not on file   Highest education level: High school graduate  Occupational History   Occupation: disability  Tobacco Use   Smoking status: Never   Smokeless tobacco: Never  Vaping Use   Vaping Use: Never used  Substance and Sexual Activity   Alcohol use: No   Drug use: No   Sexual activity: Not on file  Other Topics Concern   Not on file  Social History Narrative   No issues with transportation.    Attends church   Social Determinants of Health   Financial Resource Strain: Not on file  Food Insecurity: No  Food Insecurity (05/15/2022)   Hunger Vital Sign    Worried About Running Out of Food in the Last Year: Never true    Ran Out of Food in the Last Year: Never true  Transportation Needs: No Transportation Needs (05/15/2022)   PRAPARE - Hydrologist (Medical): No    Lack of Transportation (Non-Medical): No  Physical Activity: Not on file  Stress: Not on file  Social Connections: Not on file  Intimate Partner Violence: Not on file   Current Outpatient Medications on File Prior to Visit  Medication Sig Dispense Refill   ACCU-CHEK AVIVA PLUS test strip TEST BLOOD SUGAR AS DIRECTED 300 strip 3   Accu-Chek Softclix Lancets lancets TEST BLOOD SUGAR THREE TIMES DAILY 300 each 3   albuterol (VENTOLIN HFA) 108 (90 Base) MCG/ACT inhaler Inhale 2 puffs into the lungs every 6 (six) hours as needed for wheezing or shortness of breath. 8 g 0   amLODipine (NORVASC) 10 MG tablet TAKE 1 TABLET EVERY DAY 90 tablet 1   aspirin-sod bicarb-citric acid (ALKA-SELTZER) 325 MG TBEF tablet Take 650 mg by mouth every 6 (six) hours as needed (indigestion).     Blood Glucose Monitoring Suppl (ACCU-CHEK AVIVA PLUS) w/Device KIT USE AS DIRECTED 1 kit 0   buPROPion (WELLBUTRIN SR) 150 MG 12 hr tablet Take 1 tablet (150 mg total) by mouth 2 (two) times daily. 60 tablet 0   Continuous Blood Gluc Sensor (FREESTYLE LIBRE SENSOR SYSTEM) MISC Change sensor Q 2 wks 2 each 12   dorzolamide-timolol (COSOPT) 22.3-6.8 MG/ML ophthalmic solution Place 1 drop into both eyes 2 (two) times daily.     DROPLET PEN NEEDLES 29G X 12MM MISC USE AS DIRECTED 100 each 3   gabapentin (NEURONTIN) 600 MG tablet Take 1 tablet (600 mg total) by mouth 3 (three) times daily. 270 tablet 2   hydrALAZINE (APRESOLINE) 25 MG tablet TAKE 1 AND 1/2 TABLETS THREE TIMES DAILY 405 tablet 3   Lancets Misc. (ACCU-CHEK SOFTCLIX LANCET DEV) KIT Check blood sugars 3 times a day. 3 kit 6   LANTUS SOLOSTAR 100 UNIT/ML Solostar Pen INJECT 30  UNITS DAILY. INCREASE BY 2 UNITS EVERY 3 DAYS UNTIL MORNING SUGAR IS LESS THAN 130. MAX OF 40 UNITS. (Patient taking differently: Inject 20 Units into the skin at bedtime.) 45 mL 1   latanoprost (XALATAN) 0.005 % ophthalmic solution Place 1 drop into both eyes at bedtime.     levocetirizine (XYZAL) 5 MG tablet Take 1 tablet (5 mg total) by mouth every evening. 30 tablet 0   lidocaine (LIDODERM) 5 % Place 1 patch onto the skin daily. Remove & Discard patch within 12 hours or as directed by MD 30 patch 1  losartan (COZAAR) 50 MG tablet Take 1 tablet (50 mg total) by mouth daily. 90 tablet 3   metFORMIN (GLUCOPHAGE) 500 MG tablet Take 1 tablet (500 mg total) by mouth daily with breakfast. 90 tablet 1   methocarbamol (ROBAXIN) 500 MG tablet Take 1 tablet (500 mg total) by mouth 2 (two) times daily as needed for muscle spasms. 40 tablet 3   metoprolol tartrate (LOPRESSOR) 25 MG tablet TAKE 1/2 TABLET TWICE DAILY 90 tablet 1   montelukast (SINGULAIR) 10 MG tablet Take 1 tablet (10 mg total) by mouth at bedtime. 30 tablet 0   omeprazole (PRILOSEC) 20 MG capsule Take 1 capsule (20 mg total) by mouth daily. 30 capsule 3   rosuvastatin (CRESTOR) 20 MG tablet TAKE 1 TABLET EVERY DAY 90 tablet 0   solifenacin (VESICARE) 5 MG tablet TAKE 1 TABLET EVERY DAY 90 tablet 0   tirzepatide (MOUNJARO) 12.5 MG/0.5ML Pen Inject 12.5 mg into the skin once a week. 2 mL 0   traZODone (DESYREL) 100 MG tablet Take 1 tablet (100 mg total) by mouth at bedtime as needed for sleep. 30 tablet 0   vitamin B-12 (CYANOCOBALAMIN) 500 MCG tablet Take 1 tablet (500 mcg total) by mouth daily.     No current facility-administered medications on file prior to visit.   Allergies  Allergen Reactions   Emend [Aprepitant] Other (See Comments)    Urticaria    Lisinopril Cough   Family History  Problem Relation Age of Onset   Hypertension Mother    Hyperlipidemia Mother    Stroke Mother    Thyroid disease Mother    Hypertension  Father    Diabetes Father    Hyperlipidemia Father    Sudden death Father    Cancer Sister 80       fibrosarcoma (back); currently 1   Prostate cancer Maternal Uncle    Colon cancer Paternal Aunt        Dx 11s; deceased 23s   Prostate cancer Paternal Uncle        currently 17   Cancer Paternal Uncle 51       "bone" ; unk. primary   Stomach cancer Paternal Uncle    Hypercalcemia Neg Hx    PE: BP 112/72 (BP Location: Left Arm, Patient Position: Sitting, Cuff Size: Normal)   Pulse 68   Ht '5\' 5"'$  (1.651 m)   Wt 204 lb (92.5 kg)   LMP  (LMP Unknown)   SpO2 96%   BMI 33.95 kg/m  Wt Readings from Last 3 Encounters:  06/21/22 204 lb (92.5 kg)  06/11/22 200 lb (90.7 kg)  05/28/22 200 lb 12.8 oz (91.1 kg)   Constitutional: overweight, in NAD. No kyphosis. Eyes: EOMI, no exophthalmos ENT: no thyromegaly, no cervical lymphadenopathy Cardiovascular: RRR, No MRG Respiratory: CTA B Musculoskeletal: no deformities Skin: moist, warm, no rashes Neurological: no tremor with outstretched hands,  Assessment: 1. Hypercalcemia/hyperparathyroidism  2.  Thyroid nodules  3. DM2  Plan: Patient has had several instances of elevated calcium, with the highest level being at 12.2.  Her PTH levels have not been completely suppressed, with the latest 2 levels being at 26. - Patient also  has vitamin D deficiency, but latest levels were normal. - No apparent complications from hypercalcemia: no h/o nephrolithiasis, no osteoporosis, no fractures. No abdominal pain, depression, bone pain.  She does have a history of constipation. - I discussed with the patient about the physiology of calcium and parathyroid hormone, and possible side effects from  increased PTH, including kidney stones, osteoporosis, abdominal pain, etc.  - We discussed that we need to check whether her hyperparathyroidism is primary (Familial hypercalcemic hypocalciuria-FHH -which was suspected by [name], or parathyroid adenoma)  or secondary (to conditions like: vitamin D deficiency, calcium malabsorption, hypercalciuria, renal insufficiency, etc.).  Multiple myeloma, or MGUS, can also cause hypercalcemia.  Skeletal survey showed no evidence of multiple myeloma or neoplastic disease of the bone in 06/2019.  She does have elevated free light chains and also has a degree of renal insufficiency, but her GFR is not low enough to explain such a significant hypercalcemia.  She does have a 1.1 cm right lung nodule which was not active on the PET scan and was read as a possible in the Let's neoplasm like a pulmonary carcinoid.  Hypercalcemia is a late paraneoplastic manifestation of lung cancer and carcinoid in particular is rarely related to hypercalcemia.  PTHrp is expected to be elevated in this case.  This was normal for the patient. - I discussed with her that we first need to make sure that her vitamin D level is normal so we can further investigate the parathyroid status. I explained that in the setting of a low vitamin D, the parathyroid hormone can be elevated, which is not a pathologic finding. However, if the PTH is elevated in the setting of a normal vitamin D, we will further need to investigate her for primary or secondary hyperparathyroidism. - we will check check: Total and ionized calcium levels intact PTH ([facility]) Phosphorus vitamin D- 25 HO  We will not recheck her vitamin D- 1,25 HO again-these were normal in the past Magnesium was mostly normal in the past, with 1 lower value -of note, for [place], magnesium is expected to be elevated.  We can repeat this today 24h urinary calcium/creatinine ratio - given instructions for urine collection -she did have a urinary calcium level in the past, but without associated creatinine level.  This was normal, but a fractional excretion of calcium could not be calculated due to the lack of creatinine. - We discussed possible consequences of hyperparathyroidism: ~1/3 pts will develop  complications over 15 years (OP, nephrolithiasis).  - If the tests indicate a parathyroid adenoma, she agrees with a referral to surgery.  -She already meets criteria for surgery:  Increased calcium by more than 1 mg/dL above the upper limit of normal  Kidney ds.  Osteoporosis (or vertebral fracture) Age <75 years old Newer criteria (2013): High UCa >400 mg/d and increased stone risk by biochemical stone risk analysis Presence of nephrolithiasis or nephrocalcinosis Pt's preference!  - We may need to check a DXA scan to see if she has osteoporosis (+ add a 33% distal radius for evaluation of cortical bone, which is predominantly affected by hyperparathyroidism).  - I will advise her about vitamin D supplement dose when the results of the vitamin D level are back. - I will see the patient back in 4 months  2.  Thyroid nodules -Patient has a history of multinodular goiter, and is status post left lobectomy + isthmusectomy in 2012 -In the right thyroid lobe, she has 3 thyroid nodules, which appear to be benign per review of the latest thyroid ultrasound report from 10/2019.  One of the nodules is spongiform and the others appear more like areas of elevation -She has no neck compression symptoms -Latest TSH was normal and we will repeat this today -Will continue to manage her expectantly  3. DM2 - well-controlled now,  with the latest HbA1c being 5.5% last month, improved - She continues on metformin, long-acting insulin, and GLP-1/GIP receptor agonist, all well-tolerated - This is managed by the weight management clinic - Will continue to follow along with them, and intervene when needed.  - Total time spent for the visit: 50 min, in precharting, postcharting, reviewing Dr. Cordelia Pen last note, obtaining medical information from the chart and from the pt, reviewing her  previous labs, evaluations, and treatments, reviewing her symptoms, counseling her about her endocrine conditions (please see  the discussed topics above), and developing a plan to further investigate and treat them; she had a number of questions which I addressed    Component     Latest Ref Rng 06/21/2022  Glucose     70 - 99 mg/dL 102 (H)   BUN     6 - 24 mg/dL 20   Creatinine     0.57 - 1.00 mg/dL 1.22 (H)   BUN/Creatinine Ratio     9 - 23  16   Sodium     134 - 144 mmol/L 141   Potassium     3.5 - 5.2 mmol/L 4.2   Chloride     96 - 106 mmol/L 102   CO2     20 - 29 mmol/L 18 (L)   Calcium     8.7 - 10.2 mg/dL 12.2 (H)   TSH     0.450 - 4.500 uIU/mL 1.370   PTH, Intact     15 - 65 pg/mL 16   Vitamin D, 25-Hydroxy     30.0 - 100.0 ng/mL 31.2   Magnesium     1.6 - 2.3 mg/dL 1.6   eGFR     >59 mL/min/1.73 52 (L)   Phosphorus     3.0 - 4.3 mg/dL 2.6 (L)   Calcium Ionized     4.7 - 5.5 mg/dL 5.9 (H)    Ionized calcium is high, phosphorus is low, but PTH is now suppressed under 20.  This points more towards PTH independent hypercalcemia, possibly related to her free light chain elevation.  I do not suspect a granulomatous disease as her calcitriol levels have been normal.  Also, vitamin A was previously normal.  We did not recheck it today as she is not taking any vitamin D a supplements.  PTH RP was also normal.  No thyrotoxicosis -TSH is normal. It is reassuring that her PET scan from 2021 did not show any FDG avid sites.  She does have a history of breast cancer, but not likely metastatic. Vitamin D and magnesium levels are now normal.  GFR is slightly low.  Component     Latest Ref Rng 06/24/2022  Calcium, 24H Urine     mg/24 h 30 (L)   Creatinine, 24H Ur     0.50 - 2.15 g/24 h 1.02    24-hour urine calcium is low. The fractional excretion of calcium is 0.0029% (<0.01%), consistent with [place], which was the diagnosis suspected by [name].  However, this is a genetically inherited condition and patients have abnormal calcium levels all their lives, and do not usually have any normal calcium  levels. She had mostly normal calcium levels up until 2020.  For now, I cannot suggest parathyroid surgery.  I would suggest to get another bone scan, to look for any bone process that may cause hypercalcemia with in the setting of a suppressed PTH.  A new PET scan is another option, if the bone scan  is negative.  Nuclear medicine bone scan (07/09/2022): Uptake at the shoulders, sternoclavicular joints, wrists, knees, ankles/feet, typically degenerative.   Uptake at lateral aspects of lower lumbar spine at L4-L5 likely reflecting degenerative changes.   No additional worrisome sites of abnormal osseous tracer accumulation are identified.   Expected urinary tract and soft tissue distribution of tracer.   IMPRESSION: Scattered degenerative type uptake of tracer at multiple joints and at lower lumbar spine.   No scintigraphic evidence of osseous metastatic disease.   At this point, I do not have a clear cause for her hypercalcemia.  For now I would advise her to stay well hydrated and we will repeat the test at next visit.  We would likely need to repeat the urine collection at that time.  If this is still low, I am tempted to order the [place] gene mutation panel.  Philemon Kingdom, MD PhD [facility] Endocrinology

## 2022-06-21 NOTE — Patient Instructions (Signed)
Please stop at the lab.  Please do the urine collection.  Patient information (Up-to-Date): Collection of a 24-hour urine specimen  - You should collect every drop of urine during each 24-hour period. It does not matter how much or little urine is passed each time, as long as every drop is collected. - Begin the urine collection in the morning after you wake up, after you have emptied your bladder for the first time. - Urinate (empty the bladder) for the first time and flush it down the toilet. Note the exact time (eg, 6:15 AM). You will begin the urine collection at this time. - Collect every drop of urine during the day and night in an empty collection bottle. Store the bottle at room temperature or in the refrigerator. - If you need to have a bowel movement, any urine passed with the bowel movement should be collected. Try not to include feces with the urine collection. If feces does get mixed in, do not try to remove the feces from the urine collection bottle. - Finish by collecting the first urine passed the next morning, adding it to the collection bottle. This should be within ten minutes before or after the time of the first morning void on the first day (which was flushed). In this example, you would try to void between 6:05 and 6:25 on the second day. - If you need to urinate one hour before the final collection time, drink a full glass of water so that you can void again at the appropriate time. If you have to urinate 20 minutes before, try to hold the urine until the proper time. - Please note the exact time of the final collection, even if it is not the same time as when collection began on day 1. - The bottle(s) may be kept at room temperature for a day or two, but should be kept cool or refrigerated for longer periods of time.  Please come back for labs in 4 months.

## 2022-06-22 LAB — BASIC METABOLIC PANEL
BUN/Creatinine Ratio: 16 (ref 9–23)
BUN: 20 mg/dL (ref 6–24)
CO2: 18 mmol/L — ABNORMAL LOW (ref 20–29)
Calcium: 12.2 mg/dL — ABNORMAL HIGH (ref 8.7–10.2)
Chloride: 102 mmol/L (ref 96–106)
Creatinine, Ser: 1.22 mg/dL — ABNORMAL HIGH (ref 0.57–1.00)
Glucose: 102 mg/dL — ABNORMAL HIGH (ref 70–99)
Potassium: 4.2 mmol/L (ref 3.5–5.2)
Sodium: 141 mmol/L (ref 134–144)
eGFR: 52 mL/min/{1.73_m2} — ABNORMAL LOW (ref >59)

## 2022-06-22 LAB — PHOSPHORUS: Phosphorus: 2.6 mg/dL — ABNORMAL LOW (ref 3.0–4.3)

## 2022-06-22 LAB — PARATHYROID HORMONE, INTACT (NO CA): PTH: 16 pg/mL (ref 15–65)

## 2022-06-22 LAB — MAGNESIUM: Magnesium: 1.6 mg/dL (ref 1.6–2.3)

## 2022-06-22 LAB — VITAMIN D 25 HYDROXY (VIT D DEFICIENCY, FRACTURES): Vit D, 25-Hydroxy: 31.2 ng/mL (ref 30.0–100.0)

## 2022-06-22 LAB — TSH: TSH: 1.37 u[IU]/mL (ref 0.450–4.500)

## 2022-06-23 ENCOUNTER — Other Ambulatory Visit: Payer: Self-pay | Admitting: Internal Medicine

## 2022-06-23 DIAGNOSIS — I10 Essential (primary) hypertension: Secondary | ICD-10-CM

## 2022-06-23 DIAGNOSIS — E785 Hyperlipidemia, unspecified: Secondary | ICD-10-CM

## 2022-06-23 LAB — CALCIUM, IONIZED: Calcium, Ion: 5.9 mg/dL — ABNORMAL HIGH (ref 4.7–5.5)

## 2022-06-23 NOTE — Progress Notes (Unsigned)
Chief Complaint:   OBESITY Sarah [name] is here to discuss her progress with her obesity treatment plan along with follow-up of her obesity related diagnoses. Sarah [name] is on the Category 1 Plan and states she is following her eating plan approximately 80% of the time. 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  Today's visit was #: 32 Starting weight: 228 lbs Starting date: 02/15/2020 Today's weight: 200 lbs Today's date: 06/11/2022 Total lbs lost to date: 28 lbs Total lbs lost since last in-office visit: 0  Interim History: Sarah [name] has done well maintaining her weight over the holidays.  She reports she did have some simple carbohydrates over Christmas and New Year's, but she is working to get back on track with her prescribed nutrition plan.  She is also added in low impact cardio 2 days a week and chair yoga 1 [facility] a week at the [facility].  Subjective:   1. Type 2 diabetes mellitus with obesity ([place]) Saw PCP recently and and reports Lantus decreased 10 units daily, Metformin 500 mg daily and Moujaro 12.5 mg weekly-Denies any side effects with her medications.  Fasting blood sugars are 99-low 100s, evenings are 100-148. Denies hypoglycemia.   2. Constipation, unspecified constipation type Sarah [name] is taking Miralax 17 grams daily as needed and notes decrease in constipation.  3. depressed mood ([facility]) with emotional eating Sarah [name] is taking Wellbutrin 150 mg twice a day.  Feels it is helpful to decrease cravings.  Assessment/Plan:   1. Type 2 diabetes mellitus with obesity (HCC) Continue/Refill Mounjaro 12.5 mg SQ weekly for 1 month with 0 refills.  Continue Prescribed Nutrition Plan and exercise.  -Refill tirzepatide (MOUNJARO) 12.5 MG/0.5ML Pen; Inject 12.5 mg into the skin once a week.  Dispense: 2 mL; Refill: 0  2. Constipation, unspecified constipation type Continue Miralax 17 grams daily.  3. depressed mood ([place]) with emotional eating Continue  Wellbutrin.  Continue Prescribed Nutrition Plan and exercise.  4. Obesity, Current BMI 33.3 Sarah [name] is currently in the action stage of change. As such, her goal is to continue with weight loss efforts. She has agreed to the Category 1 Plan.   Exercise goals: As is. Adding some gentle strengthening exercises 2-3 times a week.  Behavioral modification strategies: increasing lean protein intake, decreasing simple carbohydrates, increasing water intake, meal planning and cooking strategies, keeping healthy foods in the home, and better snacking choices.  Sarah [name] has agreed to follow-up with our clinic in 2 weeks. She was informed of the importance of frequent follow-up visits to maximize her success with intensive lifestyle modifications for her multiple health conditions.   Objective:   Blood pressure 127/81, pulse (!) 56, temperature (!) 97.5 F (36.4 C), height '5\' 5"'$  (1.651 m), weight 200 lb (90.7 kg), SpO2 95 %. Body mass index is 33.28 kg/m.  General: Cooperative, alert, well developed, in no acute distress. HEENT: Conjunctivae and lids unremarkable. Cardiovascular: Regular rhythm.  Lungs: Normal work of breathing. Neurologic: No focal deficits.   Lab Results  Component Value Date   CREATININE 1.22 (H) 06/21/2022   BUN 20 06/21/2022   NA 141 06/21/2022   K 4.2 06/21/2022   CL 102 06/21/2022   CO2 18 (L) 06/21/2022   Lab Results  Component Value Date   ALT 29 01/09/2022   AST 26 01/09/2022   ALKPHOS 74 01/09/2022   BILITOT 0.5 01/09/2022   Lab Results  Component Value Date   HGBA1C 5.5 05/17/2022   HGBA1C  5.5 01/09/2022   HGBA1C 5.7 10/04/2021   HGBA1C 6.2 (H) 06/26/2021   HGBA1C 7.0 (H) 03/07/2021   No results found for: "INSULIN" Lab Results  Component Value Date   TSH 1.370 06/21/2022   Lab Results  Component Value Date   CHOL 167 01/09/2022   HDL 45 01/09/2022   LDLCALC 89 01/09/2022   TRIG 192 (H) 01/09/2022   CHOLHDL 3.7 01/09/2022   Lab Results   Component Value Date   VD25OH 31.2 06/21/2022   VD25OH 35.7 10/22/2021   VD25OH 99.6 06/26/2021   Lab Results  Component Value Date   WBC 5.4 03/28/2022   HGB 15.0 03/28/2022   HCT 46.8 (H) 03/28/2022   MCV 94.2 03/28/2022   PLT 318 03/28/2022   Lab Results  Component Value Date   IRON 43 08/18/2010   TIBC 389 08/18/2010   FERRITIN 227 08/18/2010   Attestation Statements:   Reviewed by clinician on day of visit: allergies, medications, problem list, medical history, surgical history, family history, social history, and previous encounter notes.  I, Brendell Tyus, am acting as transcriptionist for [employer], PA.  I have reviewed the above documentation for accuracy and completeness, and I agree with the above. -  Jodiann Ognibene,PA-C

## 2022-06-24 ENCOUNTER — Other Ambulatory Visit: Payer: Medicare HMO

## 2022-06-24 ENCOUNTER — Other Ambulatory Visit: Payer: Self-pay

## 2022-06-24 NOTE — Progress Notes (Signed)
Start date 06/22/2022 start time :1130am End date: 06/23/2021 end time 1130 Total volume 700 mL

## 2022-06-25 ENCOUNTER — Other Ambulatory Visit: Payer: Self-pay

## 2022-06-25 ENCOUNTER — Telehealth: Payer: Self-pay

## 2022-06-25 LAB — CALCIUM, URINE, 24 HOUR: Calcium, 24H Urine: 30 mg/24 h — ABNORMAL LOW

## 2022-06-25 LAB — CREATININE, URINE, 24 HOUR: Creatinine, 24H Ur: 1.02 g/(24.h) (ref 0.50–2.15)

## 2022-06-25 NOTE — Progress Notes (Signed)
Chief Complaint:   OBESITY Sarah [name] is here to discuss her progress with her obesity treatment plan along with follow-up of her obesity related diagnoses. Sarah [name] is on the Category 1 Plan and states she is following her eating plan approximately 90% of the time. 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  Today's visit was #: 37 Starting weight: 228 lbs Starting date: 02/15/2020 Today's weight: 200 lbs Today's date: 05/28/2022 Total lbs lost to date: 28 lbs Total lbs lost since last in-office visit: +1  lb  Interim History: Patient is up 2 lbs in muscle and down 1 lb in fat mass.  Patient saw PCP last week and she decreased metformin to one daily. Decreased insulin with A1c 5.5 on 05/17/22.  Patient is skipping less meals.   Subjective:   1. Type 2 diabetes mellitus with obesity ([place]) Patient is down to 15 units of Lantus insulin.  Blood sugar 1 since 111 every morning, 130s to 140s 2 hours postprandial.  Patient is tolerating Mounjaro well.  Positive for reflux and some constipation.  2. Environmental allergies Symptoms well-controlled.  Patient denies issues when she takes her medication.  3. Gastroesophageal reflux disease, unspecified whether esophagitis present Patient started on Prilosec at last office visit and has been tolerating well.  No longer with symptoms.  Patient declines a decrease in GLP/GLP-1 dose and understands risks with it.  4. Depressed mood ([place]) with emotional eating Patient is with stable mood, no issues.  Patient has no concerns today.  Assessment/Plan:  No orders of the defined types were placed in this encounter.   Medications Discontinued During This Encounter  Medication Reason   montelukast (SINGULAIR) 10 MG tablet Reorder   buPROPion (WELLBUTRIN SR) 150 MG 12 hr tablet Reorder   tirzepatide (MOUNJARO) 12.5 MG/0.5ML Pen Reorder   levocetirizine (XYZAL) 5 MG tablet Reorder     Meds ordered this encounter  Medications    buPROPion (WELLBUTRIN SR) 150 MG 12 hr tablet    Sig: Take 1 tablet (150 mg total) by mouth 2 (two) times daily.    Dispense:  60 tablet    Refill:  0   levocetirizine (XYZAL) 5 MG tablet    Sig: Take 1 tablet (5 mg total) by mouth every evening.    Dispense:  30 tablet    Refill:  0   montelukast (SINGULAIR) 10 MG tablet    Sig: Take 1 tablet (10 mg total) by mouth at bedtime.    Dispense:  30 tablet    Refill:  0   DISCONTD: tirzepatide (MOUNJARO) 12.5 MG/0.5ML Pen    Sig: Inject 12.5 mg into the skin once a week.    Dispense:  2 mL    Refill:  0     1. Type 2 diabetes mellitus with obesity ([place]) Patient declines decreasing dose to help with reflux and constipation.  Continue PNP, weight loss, increase exercise as tolerated.  Refill Mounjaro 12.5 mg 30-day supply.  No refills  2. Environmental allergies Refill- levocetirizine (XYZAL) 5 MG tablet; Take 1 tablet (5 mg total) by mouth every evening.  Dispense: 30 tablet; Refill: 0  Refill- montelukast (SINGULAIR) 10 MG tablet; Take 1 tablet (10 mg total) by mouth at bedtime.  Dispense: 30 tablet; Refill: 0  3. Gastroesophageal reflux disease, unspecified whether esophagitis present Continue Prilosec daily as needed.  No lying down within 3 hours of eating.  Avoid trigger foods.  4. Depressed mood ([place]) with emotional  eating Continue exercise and PNP.  Refill- buPROPion (WELLBUTRIN SR) 150 MG 12 hr tablet; Take 1 tablet (150 mg total) by mouth 2 (two) times daily.  Dispense: 60 tablet; Refill: 0  5. Obesity, Current BMI 33.4 Holiday recipes given to patient, strategies for holiday eating discussed with patient.   Sarah [name] is currently in the action stage of change. As such, her goal is to continue with weight loss efforts. She has agreed to the Category 1 Plan.   Exercise goals:  increase activity as tolerated.    Behavioral modification strategies: holiday eating strategies .  Sarah [name] has agreed to follow-up with our clinic  in 2 weeks. She was informed of the importance of frequent follow-up visits to maximize her success with intensive lifestyle modifications for her multiple health conditions.   Objective:   Blood pressure (!) 110/58, pulse 65, temperature (!) 97.5 F (36.4 C), height '5\' 5"'$  (1.651 m), weight 200 lb 12.8 oz (91.1 kg), SpO2 96 %. Body mass index is 33.41 kg/m.  General: Cooperative, alert, well developed, in no acute distress. HEENT: Conjunctivae and lids unremarkable. Cardiovascular: Regular rhythm.  Lungs: Normal work of breathing. Neurologic: No focal deficits.   Lab Results  Component Value Date   CREATININE 1.22 (H) 06/21/2022   BUN 20 06/21/2022   NA 141 06/21/2022   K 4.2 06/21/2022   CL 102 06/21/2022   CO2 18 (L) 06/21/2022   Lab Results  Component Value Date   ALT 29 01/09/2022   AST 26 01/09/2022   ALKPHOS 74 01/09/2022   BILITOT 0.5 01/09/2022   Lab Results  Component Value Date   HGBA1C 5.5 05/17/2022   HGBA1C 5.5 01/09/2022   HGBA1C 5.7 10/04/2021   HGBA1C 6.2 (H) 06/26/2021   HGBA1C 7.0 (H) 03/07/2021   No results found for: "INSULIN" Lab Results  Component Value Date   TSH 1.370 06/21/2022   Lab Results  Component Value Date   CHOL 167 01/09/2022   HDL 45 01/09/2022   LDLCALC 89 01/09/2022   TRIG 192 (H) 01/09/2022   CHOLHDL 3.7 01/09/2022   Lab Results  Component Value Date   VD25OH 31.2 06/21/2022   VD25OH 35.7 10/22/2021   VD25OH 99.6 06/26/2021   Lab Results  Component Value Date   WBC 5.4 03/28/2022   HGB 15.0 03/28/2022   HCT 46.8 (H) 03/28/2022   MCV 94.2 03/28/2022   PLT 318 03/28/2022   Lab Results  Component Value Date   IRON 43 08/18/2010   TIBC 389 08/18/2010   FERRITIN 227 08/18/2010   Attestation Statements:   Reviewed by clinician on day of visit: allergies, medications, problem list, medical history, surgical history, family history, social history, and previous encounter notes.  I, Davy Pique, RMA, am acting  as Location manager for [employer], DO.   I have reviewed the above documentation for accuracy and completeness, and I agree with the above. Marjory Sneddon, D.O.  The [place] was signed into law in 2016 which includes the topic of electronic health records.  This provides immediate access to information in MyChart.  This includes consultation notes, operative notes, office notes, lab results and pathology reports.  If you have any questions about what you read please let us know at your next visit so we can discuss your concerns and take corrective action if need be.  We are right here with you.

## 2022-06-25 NOTE — Telephone Encounter (Signed)
Prior auth submitted verbally with [facility] for [employer] #[id number]

## 2022-06-26 ENCOUNTER — Encounter (INDEPENDENT_AMBULATORY_CARE_PROVIDER_SITE_OTHER): Payer: Self-pay | Admitting: Family Medicine

## 2022-06-26 ENCOUNTER — Ambulatory Visit (INDEPENDENT_AMBULATORY_CARE_PROVIDER_SITE_OTHER): Payer: Medicare HMO | Admitting: Family Medicine

## 2022-06-26 VITALS — BP 113/80 | HR 69 | Temp 98.4°F | Ht 65.0 in | Wt 197.4 lb

## 2022-06-26 DIAGNOSIS — E1169 Type 2 diabetes mellitus with other specified complication: Secondary | ICD-10-CM

## 2022-06-26 DIAGNOSIS — E669 Obesity, unspecified: Secondary | ICD-10-CM | POA: Diagnosis not present

## 2022-06-26 DIAGNOSIS — Z6832 Body mass index (BMI) 32.0-32.9, adult: Secondary | ICD-10-CM

## 2022-06-26 DIAGNOSIS — G4709 Other insomnia: Secondary | ICD-10-CM

## 2022-06-26 MED ORDER — TRAZODONE HCL 100 MG PO TABS: 100.0000 mg | ORAL_TABLET | Freq: Every evening | ORAL | 0 refills | Status: DC | PRN

## 2022-06-28 ENCOUNTER — Ambulatory Visit: Payer: Medicare HMO | Attending: Internal Medicine

## 2022-06-28 DIAGNOSIS — Z0000 Encounter for general adult medical examination without abnormal findings: Secondary | ICD-10-CM

## 2022-06-28 NOTE — Progress Notes (Signed)
Subjective:   Sarah [name] is a 57 y.o. female who presents for Medicare Annual (Subsequent) preventive examination.  Review of Systems     I connected with Adine Heimann on 06/28/2022 at 9:03 am by telephone and verified that I am speaking with the correct person using two identifiers. I discussed the limitations, risks, security and privacy concerns of performing an evaluation and management service by telephone and the availability of in person appointments. I also discussed with the patient that there may be a patient responsible charge related to this service. The patient expressed understanding and agreed to proceed.   Patient location: Home My Location: [place] on the telephone call: Myself and [place]           Objective:    There were no vitals filed for this visit. There is no height or weight on file to calculate BMI.     03/28/2022   12:23 PM 07/03/2021    3:14 PM 06/26/2021    3:36 PM 06/26/2021    3:00 PM 02/03/2020    7:45 AM 01/28/2020   10:32 AM 01/11/2020   10:02 AM  Advanced Directives  Does Patient Have a Medical Advance Directive? No No No No No No No  Would patient like information on creating a medical advance directive?  No - Patient declined No - Patient declined No - Patient declined No - Patient declined No - Patient declined No - Patient declined    Current Medications (verified) Outpatient Encounter Medications as of 06/28/2022  Medication Sig   ACCU-CHEK AVIVA PLUS test strip TEST BLOOD SUGAR AS DIRECTED   Accu-Chek Softclix Lancets lancets TEST BLOOD SUGAR THREE TIMES DAILY   albuterol (VENTOLIN HFA) 108 (90 Base) MCG/ACT inhaler Inhale 2 puffs into the lungs every 6 (six) hours as needed for wheezing or shortness of breath.   amLODipine (NORVASC) 10 MG tablet TAKE 1 TABLET EVERY DAY   aspirin-sod bicarb-citric acid (ALKA-SELTZER) 325 MG TBEF tablet Take 650 mg by mouth every 6 (six) hours as needed (indigestion).    Blood Glucose Monitoring Suppl (ACCU-CHEK AVIVA PLUS) w/Device KIT USE AS DIRECTED   buPROPion (WELLBUTRIN SR) 150 MG 12 hr tablet Take 1 tablet (150 mg total) by mouth 2 (two) times daily.   Continuous Blood Gluc Sensor (FREESTYLE LIBRE SENSOR SYSTEM) MISC Change sensor Q 2 wks   dorzolamide-timolol (COSOPT) 22.3-6.8 MG/ML ophthalmic solution Place 1 drop into both eyes 2 (two) times daily.   DROPLET PEN NEEDLES 29G X 12MM MISC USE AS DIRECTED   gabapentin (NEURONTIN) 600 MG tablet Take 1 tablet (600 mg total) by mouth 3 (three) times daily.   hydrALAZINE (APRESOLINE) 25 MG tablet TAKE 1 AND 1/2 TABLETS THREE TIMES DAILY   Lancets Misc. (ACCU-CHEK SOFTCLIX LANCET DEV) KIT Check blood sugars 3 times a day.   LANTUS SOLOSTAR 100 UNIT/ML Solostar Pen INJECT 30 UNITS DAILY. INCREASE BY 2 UNITS EVERY 3 DAYS UNTIL MORNING SUGAR IS LESS THAN 130. MAX OF 40 UNITS. (Patient taking differently: Inject 20 Units into the skin at bedtime.)   latanoprost (XALATAN) 0.005 % ophthalmic solution Place 1 drop into both eyes at bedtime.   levocetirizine (XYZAL) 5 MG tablet Take 1 tablet (5 mg total) by mouth every evening.   lidocaine (LIDODERM) 5 % Place 1 patch onto the skin daily. Remove & Discard patch within 12 hours or as directed by MD   losartan (COZAAR) 50 MG tablet Take 1 tablet (50 mg total) by  mouth daily.   metFORMIN (GLUCOPHAGE) 500 MG tablet Take 1 tablet (500 mg total) by mouth daily with breakfast.   methocarbamol (ROBAXIN) 500 MG tablet Take 1 tablet (500 mg total) by mouth 2 (two) times daily as needed for muscle spasms.   metoprolol tartrate (LOPRESSOR) 25 MG tablet TAKE 1/2 TABLET TWICE DAILY   montelukast (SINGULAIR) 10 MG tablet Take 1 tablet (10 mg total) by mouth at bedtime.   omeprazole (PRILOSEC) 20 MG capsule Take 1 capsule (20 mg total) by mouth daily.   rosuvastatin (CRESTOR) 20 MG tablet TAKE 1 TABLET EVERY DAY   solifenacin (VESICARE) 5 MG tablet TAKE 1 TABLET EVERY DAY   tirzepatide  (MOUNJARO) 12.5 MG/0.5ML Pen Inject 12.5 mg into the skin once a week.   traZODone (DESYREL) 100 MG tablet Take 1 tablet (100 mg total) by mouth at bedtime as needed for sleep.   vitamin B-12 (CYANOCOBALAMIN) 500 MCG tablet Take 1 tablet (500 mcg total) by mouth daily.   No facility-administered encounter medications on file as of 06/28/2022.    Allergies (verified) Emend [aprepitant] and Lisinopril   History: Past Medical History:  Diagnosis Date   Arthritis    Diabetes mellitus without complication ([facility])    Family history of adverse reaction to anesthesia    sister-n/v   Family history of colon cancer    GERD (gastroesophageal reflux disease)    Glaucoma 07/2014   Glucagonoma 07/28/2014   Pt denies this but states she has glaucoma   History of chemotherapy    History of hiatal hernia    Hypercalcemia    Hypertension    Imbalance    Neuropathy    feet bilat   Nocturia    Peritoneal carcinomatosis ([place])    carcinoma of ovary    Shortness of breath dyspnea    hx of 2013 - no problems currently    Sleep apnea    Thyroid nodule    history of    Wears glasses    Past Surgical History:  Procedure Laterality Date   ABDOMINAL HYSTERECTOMY     FOOT SURGERY  04/2018   [facility]   HERNIA REPAIR     INCISIONAL HERNIA REPAIR N/A 07/06/2015   Procedure: INCISIONAL HERNIA REPAIR ;  Surgeon: Rolm Bookbinder, MD;  Location: [facility];  Service: General;  Laterality: N/A;   [place] N/A 07/03/2021   Procedure: Fatima Blank HERNIA REPAIR WITH MESH;  Surgeon: Ralene Ok, MD;  Location: [facility];  Service: General;  Laterality: N/A;   INSERTION OF MESH N/A 07/06/2015   Procedure: WITH INSERTION OF PHASIX ST MESH;  Surgeon: Rolm Bookbinder, MD;  Location: [facility];  Service: General;  Laterality: N/A;   LAPAROTOMY N/A 07/06/2015   Procedure: EXPLORATORY LAPAROTOMY;  Surgeon: Everitt Amber, MD;  Location: [facility];  Service: Gynecology;  Laterality: N/A;   LYSIS OF  ADHESION N/A 07/06/2015   Procedure:  LYSIS OF ADHESION RESECTION OF MESENTERIC MASS BOWEL RESECTION ;  Surgeon: Everitt Amber, MD;  Location: [facility];  Service: Gynecology;  Laterality: N/A;   LYSIS OF ADHESION N/A 07/03/2021   Procedure: EXPLORATORY LAPAROTOMY WITH LYSIS OF ADHESION;  Surgeon: Ralene Ok, MD;  Location: [facility];  Service: General;  Laterality: N/A;   ROBOTIC ASSISTED TOTAL HYSTERECTOMY WITH BILATERAL SALPINGO OOPHERECTOMY Bilateral 07/28/2014   Procedure: ROBOTIC ASSISTED lysis of adhesions with biopsies, converted to LAPAROTOMY, bilateral salpingoorphorectomy, omentectomy,appendectomy;  Surgeon: Janie Morning, MD;  Location: [facility];  Service: Gynecology;  Laterality: Bilateral;  THYROIDECTOMY, PARTIAL     VIDEO BRONCHOSCOPY N/A 01/13/2020   Procedure: VIDEO BRONCHOSCOPY WITH BIOPSIES;  Surgeon: Melrose Nakayama, MD;  [facility];  Service: Thoracic;  Laterality: N/A;   Family History  Problem Relation Age of Onset   Hypertension Mother    Hyperlipidemia Mother    Stroke Mother    Thyroid disease Mother    Arthritis Mother    Hearing loss Mother    Hypertension Father    Diabetes Father    Hyperlipidemia Father    Sudden death Father    Arthritis Father    Heart disease Father    Obesity Father    Cancer Sister 73       fibrosarcoma (back); currently 59   Cancer Brother    Prostate cancer Maternal Uncle    Colon cancer Paternal Aunt        Dx 47s; deceased 18s   Prostate cancer Paternal Uncle        currently 61   Cancer Paternal Uncle 57       "bone" ; unk. primary   Stomach cancer Paternal Uncle    Hypercalcemia Neg Hx    Social History   Socioeconomic History   Marital status: Married    Spouse name: Not on file   Number of children: 2   Years of education: Not on file   Highest education level: High school graduate  Occupational History   Occupation: disability  Tobacco Use   Smoking status: Never   Smokeless tobacco: Never  Vaping  Use   Vaping Use: Never used  Substance and Sexual Activity   Alcohol use: No   Drug use: No   Sexual activity: Not Currently    Birth control/protection: None  Other Topics Concern   Not on file  Social History Narrative   No issues with transportation.    Attends church   Social Determinants of Health   Financial Resource Strain: Not on file  Food Insecurity: No Food Insecurity (05/15/2022)   Hunger Vital Sign    Worried About Running Out of Food in the Last Year: Never true    Ran Out of Food in the Last Year: Never true  Transportation Needs: No Transportation Needs (05/15/2022)   PRAPARE - Hydrologist (Medical): No    Lack of Transportation (Non-Medical): No  Physical Activity: Not on file  Stress: Not on file  Social Connections: Not on file    Tobacco Counseling Counseling given: Not Answered   Clinical Intake:              How often do you need to have someone help you when you read instructions, pamphlets, or other written materials from your doctor or pharmacy?: (P) 1 - Never  Diabetic?yes         Activities of Daily Living    06/26/2022    8:41 PM 07/03/2021    3:14 PM  In your present state of health, do you have any difficulty performing the following activities:  Hearing? 0 0  Vision? 0 0  Difficulty concentrating or making decisions? 0 0  Walking or climbing stairs? 1 0  Dressing or bathing? 0 0  Doing errands, shopping? 0 0  Preparing Food and eating ? N   Using the Toilet? N   In the past six months, have you accidently leaked urine? Y   Do you have problems with loss of bowel control? N   Managing your Medications?  N   Managing your Finances? N   Housekeeping or managing your Housekeeping? N     Patient Care Team: Ladell Pier, MD as PCP - General (Internal Medicine)  Indicate any recent Medical Services you may have received from other than [facility] providers in the past year (date may be  approximate).     Assessment:   This is a routine wellness examination for [place].  Hearing/Vision screen No results found.  Dietary issues and exercise activities discussed:     Goals Addressed   None    Depression Screen    05/17/2022   10:00 AM 05/17/2022    9:59 AM 01/11/2022   10:47 AM 10/04/2021    9:44 AM 06/26/2021    3:36 PM 01/25/2021    2:49 PM 04/28/2020    3:20 PM  PHQ 2/9 Scores  PHQ - 2 Score 0 0 0 0 0 0 0  PHQ- 9 Score 3  3 0       Fall Risk    06/26/2022    8:41 PM 05/17/2022    9:53 AM 01/11/2022   10:47 AM 10/04/2021    9:44 AM 06/26/2021    3:36 PM  Fall Risk   Falls in the past year? 0 0 0 0 0  Number falls in past yr: 0 0 0 0 0  Injury with Fall? 1 0 0 0 0  Risk for fall due to :  No Fall Risks No Fall Risks No Fall Risks   Follow up   Falls evaluation completed Falls evaluation completed Falls evaluation completed;Education provided;Falls prevention discussed    FALL RISK PREVENTION PERTAINING TO THE HOME:  Any stairs in or around the home? Yes  If so, are there any without handrails? Yes  Home free of loose throw rugs in walkways, pet beds, electrical cords, etc? Yes  Adequate lighting in your home to reduce risk of falls? Yes   ASSISTIVE DEVICES UTILIZED TO PREVENT FALLS:  Life alert? No  Use of a cane, walker or w/c? No  Grab bars in the bathroom? No  Shower chair or bench in shower? No  Elevated toilet seat or a handicapped toilet? No   TIMED UP AND GO:  Was the test performed? No .  Length of time to ambulate 10 feet: N/A sec.   Gait slow and steady without use of assistive device  Cognitive Function:    06/26/2021    3:38 PM 01/28/2020   10:33 AM  MMSE - Mini Mental State Exam  Orientation to time 5 5  Orientation to Place 5 5  Registration 3 3  Attention/ Calculation 5 5  Recall 3 3  Language- name 2 objects 2 2  Language- repeat 1 1  Language- follow 3 step command 3 3  Language- read & follow direction 1 1  Write a  sentence 1 1  Copy design 1 1  Total score 30 30        Immunizations Immunization History  Administered Date(s) Administered   Influenza Whole 03/22/2019   Influenza,inj,Quad PF,6+ Mos 06/29/2014, 03/27/2015, 03/14/2016, 02/17/2017, 02/20/2018, 01/28/2020, 04/04/2022   Influenza-Unspecified 03/10/2021   PFIZER(Purple Top)SARS-COV-2 Vaccination 09/10/2019, 10/04/2019   Pfizer Covid-19 Vaccine Bivalent Booster 72yr & up 04/03/2021   Pneumococcal Conjugate (Pcv15) 05/28/2021   Pneumococcal Polysaccharide-23 06/29/2014   Tdap 11/17/2017   Zoster Recombinat (Shingrix) 01/28/2020, 04/28/2020    TDAP status: Up to date  Flu Vaccine status: Up to date  Pneumococcal vaccine status: Up to date  Covid-19 vaccine status: Completed vaccines  Qualifies for Shingles Vaccine? Yes   Zostavax completed Yes   Shingrix Completed?: Yes  Screening Tests Health Maintenance  Topic Date Due   HIV Screening  Never done   OPHTHALMOLOGY EXAM  02/08/2022   COVID-19 Vaccine (4 - 2023-24 season) 02/08/2022   Medicare Annual Wellness (AWV)  06/26/2022   FOOT EXAM  10/05/2022   HEMOGLOBIN A1C  11/16/2022   Diabetic kidney evaluation - Urine ACR  05/18/2023   Diabetic kidney evaluation - eGFR measurement  06/22/2023   MAMMOGRAM  06/14/2024   COLONOSCOPY (Pts 45-92yr Insurance coverage will need to be confirmed)  10/03/2026   DTaP/Tdap/Td (2 - Td or Tdap) 11/18/2027   INFLUENZA VACCINE  Completed   Hepatitis C Screening  Completed   Zoster Vaccines- Shingrix  Completed   HPV VACCINES  Aged Out    Health Maintenance  Health Maintenance Due  Topic Date Due   HIV Screening  Never done   OPHTHALMOLOGY EXAM  02/08/2022   COVID-19 Vaccine (4 - 2023-24 season) 02/08/2022   Medicare Annual Wellness (AWV)  06/26/2022    Colorectal cancer screening: Type of screening: Colonoscopy. Completed 10/02/2016. Repeat every 10 years  Mammogram status: Completed 06/14/2022. Repeat every year   Lung  Cancer Screening: (Low Dose CT Chest recommended if Age 22108-80years, 30 pack-year currently smoking OR have quit w/in 15years.) does not qualify.   Lung Cancer Screening Referral: N/A  Additional Screening:  Hepatitis C Screening: does qualify; Completed 08/18/2010  Vision Screening: Recommended annual ophthalmology exams for early detection of glaucoma and other disorders of the eye. Is the patient up to date with their annual eye exam?  Yes  Who is the provider or what is the name of the office in which the patient attends annual eye exams[place] not [facility] with a provider, would they like to be referred to a provider to establish care? No .   Dental Screening: Recommended annual dental exams for proper oral hygiene  Community Resource Referral / Chronic Care Management: CRR required this visit?  No   CCM required this visit?  No      Plan:     I have personally reviewed and noted the following in the patient's chart:   Medical and social history Use of alcohol, tobacco or illicit drugs  Current medications and supplements including opioid prescriptions. Patient is not currently taking opioid prescriptions. Functional ability and status Nutritional status Physical activity Advanced directives List of other physicians Hospitalizations, surgeries, and ER visits in previous 12 months Vitals Screenings to include cognitive, depression, and falls Referrals and appointments  In addition, I have reviewed and discussed with patient certain preventive protocols, quality metrics, and best practice recommendations. A written personalized care plan for preventive services as well as general preventive health recommendations were provided to patient.     AGomez Cleverly CArnold  06/28/2022   Nurse Notes: I spent 30 minutes on this telephone encounter Patient can view AVS via Mychart.

## 2022-06-28 NOTE — Patient Instructions (Signed)
Sarah [name] , Thank you for taking time to come for your Medicare Wellness Visit. I appreciate your ongoing commitment to your health goals. Please review the following plan we discussed and let me know if I can assist you in the future.   These are the goals we discussed:  Goals      Weight (lb) < 200 lb (90.7 kg)     I would like to lose enough weight to get off insulin.        This is a list of the screening recommended for you and due dates:  Health Maintenance  Topic Date Due   HIV Screening  Never done   Eye exam for diabetics  02/08/2022   COVID-19 Vaccine (4 - 2023-24 season) 02/08/2022   Medicare Annual Wellness Visit  06/26/2022   Complete foot exam   10/05/2022   Hemoglobin A1C  11/16/2022   Yearly kidney health urinalysis for diabetes  05/18/2023   Yearly kidney function blood test for diabetes  06/22/2023   Mammogram  06/14/2024   Colon Cancer Screening  10/03/2026   DTaP/Tdap/Td vaccine (2 - Td or Tdap) 11/18/2027   Flu Shot  Completed   Hepatitis C Screening: USPSTF Recommendation to screen - Ages 18-79 yo.  Completed   Zoster (Shingles) Vaccine  Completed   HPV Vaccine  Aged Out  Health Maintenance, Female Adopting a healthy lifestyle and getting preventive care are important in promoting health and wellness. Ask your health care provider about: The right schedule for you to have regular tests and exams. Things you can do on your own to prevent diseases and keep yourself healthy. What should I know about diet, weight, and exercise? Eat a healthy diet  Eat a diet that includes plenty of vegetables, fruits, low-fat dairy products, and lean protein. Do not eat a lot of foods that are high in solid fats, added sugars, or sodium. Maintain a healthy weight Body mass index (BMI) is used to identify weight problems. It estimates body fat based on height and weight. Your health care provider can help determine your BMI and help you achieve or maintain a healthy  weight. Get regular exercise Get regular exercise. This is one of the most important things you can do for your health. Most adults should: Exercise for at least 150 minutes each week. The exercise should increase your heart rate and make you sweat (moderate-intensity exercise). Do strengthening exercises at least twice a week. This is in addition to the moderate-intensity exercise. Spend less time sitting. Even light physical activity can be beneficial. Watch cholesterol and blood lipids Have your blood tested for lipids and cholesterol at 57 years of age, then have this test every 5 years. Have your cholesterol levels checked more often if: Your lipid or cholesterol levels are high. You are older than 57 years of age. You are at high risk for heart disease. What should I know about cancer screening? Depending on your health history and family history, you may need to have cancer screening at various ages. This may include screening for: Breast cancer. Cervical cancer. Colorectal cancer. Skin cancer. Lung cancer. What should I know about heart disease, diabetes, and high blood pressure? Blood pressure and heart disease High blood pressure causes heart disease and increases the risk of stroke. This is more likely to develop in people who have high blood pressure readings or are overweight. Have your blood pressure checked: Every 3-5 years if you are 61-78 years of age. Every year if  you are 47 years old or older. Diabetes Have regular diabetes screenings. This checks your fasting blood sugar level. Have the screening done: Once every three years after age 70 if you are at a normal weight and have a low risk for diabetes. More often and at a younger age if you are overweight or have a high risk for diabetes. What should I know about preventing infection? Hepatitis B If you have a higher risk for hepatitis B, you should be screened for this virus. Talk with your health care provider to  find out if you are at risk for hepatitis B infection. Hepatitis C Testing is recommended for: Everyone born from [birth year] through 1965. Anyone with known risk factors for hepatitis C. Sexually transmitted infections (STIs) Get screened for STIs, including gonorrhea and chlamydia, if: You are sexually active and are younger than 57 years of age. You are older than 57 years of age and your health care provider tells you that you are at risk for this type of infection. Your sexual activity has changed since you were last screened, and you are at increased risk for chlamydia or gonorrhea. Ask your health care provider if you are at risk. Ask your health care provider about whether you are at high risk for HIV. Your health care provider may recommend a prescription medicine to help prevent HIV infection. If you choose to take medicine to prevent HIV, you should first get tested for HIV. You should then be tested every 3 months for as long as you are taking the medicine. Pregnancy If you are about to stop having your period (premenopausal) and you may become pregnant, seek counseling before you get pregnant. Take 400 to 800 micrograms (mcg) of folic acid every day if you become pregnant. Ask for birth control (contraception) if you want to prevent pregnancy. Osteoporosis and menopause Osteoporosis is a disease in which the bones lose minerals and strength with aging. This can result in bone fractures. If you are 80 years old or older, or if you are at risk for osteoporosis and fractures, ask your health care provider if you should: Be screened for bone loss. Take a calcium or vitamin D supplement to lower your risk of fractures. Be given hormone replacement therapy (HRT) to treat symptoms of menopause. Follow these instructions at home: Alcohol use Do not drink alcohol if: Your health care provider tells you not to drink. You are pregnant, may be pregnant, or are planning to become pregnant. If you  drink alcohol: Limit how much you have to: 0-1 drink a day. Know how much alcohol is in your drink. In the [place] one drink equals one 12 oz bottle of beer (355 mL), one 5 oz glass of wine (148 mL), or one 1 oz glass of hard liquor (44 mL). Lifestyle Do not use any products that contain nicotine or tobacco. These products include cigarettes, chewing tobacco, and vaping devices, such as e-cigarettes. If you need help quitting, ask your health care provider. Do not use street drugs. Do not share needles. Ask your health care provider for help if you need support or information about quitting drugs. General instructions Schedule regular health, dental, and eye exams. Stay current with your vaccines. Tell your health care provider if: You often feel depressed. You have ever been abused or do not feel safe at home. Summary Adopting a healthy lifestyle and getting preventive care are important in promoting health and wellness. Follow your health care provider's instructions about healthy diet,  exercising, and getting tested or screened for diseases. Follow your health care provider's instructions on monitoring your cholesterol and blood pressure. This information is not intended to replace advice given to you by your health care provider. Make sure you discuss any questions you have with your health care provider. Document Revised: 10/16/2020 Document Reviewed: 10/16/2020 Elsevier Patient Education  [place] Directive  Advance directives are legal documents that allow you to make decisions about your health care and medical treatment in case you become unable to communicate for yourself. Advance directives let your wishes be known to family, friends, and health care providers. Discussing and writing advance directives should happen over time rather than all at once. Advance directives can be changed and updated at any time. There are different types of advance directives, such  as: Medical power of attorney. Living will. Do not resuscitate (DNR) order or do not attempt resuscitation (DNAR) order. Health care proxy and medical power of attorney A health care proxy is also called a health care agent. This person is appointed to make medical decisions for you when you are unable to make decisions for yourself. Generally, people ask a trusted friend or family member to act as their proxy and represent their preferences. Make sure you have an agreement with your trusted person to act as your proxy. A proxy may have to make a medical decision on your behalf if your wishes are not known. A medical power of attorney, also called a durable power of attorney for health care, is a legal document that names your health care proxy. Depending on the laws in your state, the document may need to be: Signed. Notarized. Dated. Copied. Witnessed. Incorporated into your medical record. You may also want to appoint a trusted person to manage your money in the event you are unable to do so. This is called a durable power of attorney for finances. It is a separate legal document from the durable power of attorney for health care. You may choose your health care proxy or someone different to act as your agent in money matters. If you do not appoint a proxy, or there is a concern that the proxy is not acting in your best interest, a court may appoint a guardian to act on your behalf. Living will A living will is a set of instructions that state your wishes about medical care when you cannot express them yourself. Health care providers should keep a copy of your living will in your medical record. You may want to give a copy to family members or friends. To alert caregivers in case of an emergency, you can place a card in your wallet to let them know that you have a living will and where they can find it. A living will is used if you become: Terminally ill. Disabled. Unable to communicate or make  decisions. The following decisions should be included in your living will: To use or not to use life support equipment, such as dialysis machines and breathing machines (ventilators). Whether you want a DNR or DNAR order. This tells health care providers not to use cardiopulmonary resuscitation (CPR) if breathing or heartbeat stops. To use or not to use tube feeding. To be given or not to be given food and fluids. Whether you want comfort (palliative) care when the goal becomes comfort rather than a cure. Whether you want to donate your organs and tissues. A living will does not give instructions for distributing your money and  property if you should pass away. DNR or DNAR A DNR or DNAR order is a request not to have CPR in the event that your heart stops beating or you stop breathing. If a DNR or DNAR order has not been made and shared, a health care provider will try to help any patient whose heart has stopped or who has stopped breathing. If you plan to have surgery, talk with your health care provider about how your DNR or DNAR order will be followed if problems occur. What if I do not have an advance directive? Some states assign family decision makers to act on your behalf if you do not have an advance directive. Each state has its own laws about advance directives. You may want to check with your health care provider, attorney, or state representative about the laws in your state. Summary Advance directives are legal documents that allow you to make decisions about your health care and medical treatment in case you become unable to communicate for yourself. The process of discussing and writing advance directives should happen over time. You can change and update advance directives at any time. Advance directives may include a medical power of attorney, a living will, and a DNR or DNAR order. This information is not intended to replace advice given to you by your health care provider. Make  sure you discuss any questions you have with your health care provider. Document Revised: 02/29/2020 Document Reviewed: 02/29/2020 Elsevier Patient Education  [place].

## 2022-07-01 ENCOUNTER — Other Ambulatory Visit: Payer: Self-pay

## 2022-07-02 ENCOUNTER — Other Ambulatory Visit (HOSPITAL_COMMUNITY): Payer: Medicare HMO

## 2022-07-05 ENCOUNTER — Other Ambulatory Visit: Payer: Self-pay

## 2022-07-05 NOTE — Telephone Encounter (Signed)
Sarah from [employer] states that the Sensor processed through insurance today. I had not gotten any communication back from [facility] regarding approval denial. Judson Roch said that it would take 7-10 days for the patient to receive the sensors in the mail.

## 2022-07-08 ENCOUNTER — Ambulatory Visit (INDEPENDENT_AMBULATORY_CARE_PROVIDER_SITE_OTHER): Payer: Medicare HMO

## 2022-07-08 ENCOUNTER — Encounter: Payer: Self-pay | Admitting: Podiatry

## 2022-07-08 ENCOUNTER — Ambulatory Visit (INDEPENDENT_AMBULATORY_CARE_PROVIDER_SITE_OTHER): Payer: Medicare HMO | Admitting: Podiatry

## 2022-07-08 DIAGNOSIS — M778 Other enthesopathies, not elsewhere classified: Secondary | ICD-10-CM | POA: Diagnosis not present

## 2022-07-08 MED ORDER — TRIAMCINOLONE ACETONIDE 40 MG/ML IJ SUSP
20.0000 mg | Freq: Once | INTRAMUSCULAR | Status: AC
  Administered 2022-07-08: 20 mg

## 2022-07-08 NOTE — Progress Notes (Signed)
She presents today chief complaint of painful midfoot right.  States been aching for the past 2 to 3 weeks denies any injury states that it started swelling she tried to brace her Planter fasciitis but it just made it worse.  Objective: Vital signs are stable she is alert and oriented x 3.  Pulses are palpable.  She has pain on palpation of the tarsometatarsal joints dorsal aspect right foot with pain on palpation of the peroneal nerve.  She has tenderness on range of motion at the TMT joint with range of motion of the second metatarsal.  Radiographs taken today demonstrate osseously mature individual with osteoarthritic change of the tarsometatarsal joints of that right foot.  Assessment: Capsulitis osteoarthritis neuritis right foot.  Plan: Discussed topical anti-inflammatories oral anti-inflammatories also injected 10 mg of Kenalog and local anesthetic tarsal dorsal aspect of the foot at the point of maximal tenderness.  She tolerated procedure very well and I will follow-up with her on an as-needed basis.  We did discuss appropriate shoe gear stretching exercises lacing techniques and shoe gear modification.

## 2022-07-09 ENCOUNTER — Encounter (HOSPITAL_COMMUNITY)
Admission: RE | Admit: 2022-07-09 | Discharge: 2022-07-09 | Disposition: A | Discharge status: Home / Self Care | Payer: Medicare HMO | Source: Ambulatory Visit | Attending: Internal Medicine | Admitting: Internal Medicine

## 2022-07-09 ENCOUNTER — Ambulatory Visit (HOSPITAL_COMMUNITY)
Admission: RE | Admit: 2022-07-09 | Discharge: 2022-07-09 | Disposition: A | Discharge status: Home / Self Care | Payer: Medicare HMO | Source: Ambulatory Visit | Attending: Internal Medicine | Admitting: Internal Medicine

## 2022-07-09 DIAGNOSIS — M545 Low back pain, unspecified: Secondary | ICD-10-CM | POA: Diagnosis not present

## 2022-07-09 DIAGNOSIS — M5136 Other intervertebral disc degeneration, lumbar region: Secondary | ICD-10-CM | POA: Diagnosis not present

## 2022-07-09 DIAGNOSIS — C50919 Malignant neoplasm of unspecified site of unspecified female breast: Secondary | ICD-10-CM | POA: Diagnosis not present

## 2022-07-09 MED ORDER — TECHNETIUM TC 99M MEDRONATE IV KIT
20.0000 | PACK | Freq: Once | INTRAVENOUS | Status: AC | PRN
  Administered 2022-07-09: 19.5 via INTRAVENOUS

## 2022-07-10 ENCOUNTER — Ambulatory Visit (INDEPENDENT_AMBULATORY_CARE_PROVIDER_SITE_OTHER): Payer: Medicare HMO | Admitting: Family Medicine

## 2022-07-10 ENCOUNTER — Encounter (INDEPENDENT_AMBULATORY_CARE_PROVIDER_SITE_OTHER): Payer: Self-pay | Admitting: Family Medicine

## 2022-07-10 VITALS — BP 121/64 | HR 70 | Temp 98.1°F | Ht 65.0 in | Wt 197.6 lb

## 2022-07-10 DIAGNOSIS — E1169 Type 2 diabetes mellitus with other specified complication: Secondary | ICD-10-CM

## 2022-07-10 DIAGNOSIS — Z7985 Long-term (current) use of injectable non-insulin antidiabetic drugs: Secondary | ICD-10-CM

## 2022-07-10 DIAGNOSIS — Z9109 Other allergy status, other than to drugs and biological substances: Secondary | ICD-10-CM

## 2022-07-10 DIAGNOSIS — Z6832 Body mass index (BMI) 32.0-32.9, adult: Secondary | ICD-10-CM | POA: Diagnosis not present

## 2022-07-10 DIAGNOSIS — F39 Unspecified mood [affective] disorder: Secondary | ICD-10-CM | POA: Diagnosis not present

## 2022-07-10 DIAGNOSIS — I10 Essential (primary) hypertension: Secondary | ICD-10-CM | POA: Insufficient documentation

## 2022-07-10 MED ORDER — MONTELUKAST SODIUM 10 MG PO TABS: 10.0000 mg | ORAL_TABLET | Freq: Every day | ORAL | 0 refills | Status: DC

## 2022-07-10 MED ORDER — BUPROPION HCL ER (SR) 150 MG PO TB12: 150.0000 mg | ORAL_TABLET | Freq: Two times a day (BID) | ORAL | 0 refills | Status: DC

## 2022-07-10 MED ORDER — LEVOCETIRIZINE DIHYDROCHLORIDE 5 MG PO TABS: 5.0000 mg | ORAL_TABLET | Freq: Every evening | ORAL | 0 refills | Status: DC

## 2022-07-10 NOTE — Addendum Note (Signed)
Addended by: Philemon Kingdom on: 07/10/2022 01:05 PM   Modules accepted: Orders

## 2022-07-12 ENCOUNTER — Other Ambulatory Visit (HOSPITAL_COMMUNITY): Payer: Self-pay

## 2022-07-12 DIAGNOSIS — H401133 Primary open-angle glaucoma, bilateral, severe stage: Secondary | ICD-10-CM | POA: Diagnosis not present

## 2022-07-12 DIAGNOSIS — Z794 Long term (current) use of insulin: Secondary | ICD-10-CM | POA: Diagnosis not present

## 2022-07-12 DIAGNOSIS — H2511 Age-related nuclear cataract, right eye: Secondary | ICD-10-CM | POA: Diagnosis not present

## 2022-07-12 DIAGNOSIS — H2513 Age-related nuclear cataract, bilateral: Secondary | ICD-10-CM | POA: Diagnosis not present

## 2022-07-12 DIAGNOSIS — E119 Type 2 diabetes mellitus without complications: Secondary | ICD-10-CM | POA: Diagnosis not present

## 2022-07-12 MED ORDER — PREDNISOLONE ACETATE 1 % OP SUSP
1.0000 [drp] | Freq: Four times a day (QID) | OPHTHALMIC | 1 refills | Status: DC
  Filled 2022-07-12: qty 5, 25d supply, fill #0

## 2022-07-12 MED ORDER — KETOROLAC TROMETHAMINE 0.5 % OP SOLN
1.0000 [drp] | Freq: Four times a day (QID) | OPHTHALMIC | 1 refills | Status: DC
  Filled 2022-07-12: qty 10, 50d supply, fill #0

## 2022-07-12 MED ORDER — GATIFLOXACIN 0.5 % OP SOLN
1.0000 [drp] | Freq: Four times a day (QID) | OPHTHALMIC | 1 refills | Status: DC
  Filled 2022-07-12: qty 2.5, 13d supply, fill #0

## 2022-07-15 ENCOUNTER — Other Ambulatory Visit (HOSPITAL_COMMUNITY): Payer: Self-pay

## 2022-07-15 NOTE — Progress Notes (Signed)
Chief Complaint:   OBESITY Sarah [name] is here to discuss her progress with her obesity treatment plan along with follow-up of her obesity related diagnoses. Sarah [name] is on the Category 1 Plan and states she is following her eating plan approximately 90% of the time. Sarah [name] states she is doing Chief of Staff for 45 minutes 3 times per week.  Today's visit was #: 58 Starting weight: 228 lbs Starting date: 02/15/2020 Today's weight: 197 lbs Today's date: 06/26/2022 Total lbs lost to date: 31 Total lbs lost since last in-office visit: 3  Interim History: Sarah [name] is here for a follow up office visit.  We reviewed her meal plan and all questions were answered.  Patient's food recall appears to be accurate and consistent with what is on plan when she is following it.   When eating on plan, her hunger and cravings are well controlled. Sarah [name] has an upcoming birthday party that she needs to go to in the near future. We discussed strategies for success.   Subjective:   1. Other insomnia Sarah [name] notes insomnia due to emotional stressors in the past, but doing better and well. She takes Wellbutrin every day, and she denies a need for a refill. She takes trazodone occasionally, and she notes it works well and she is tolerating it well.   2. Type 2 diabetes mellitus with obesity (Sarah [name]) [facility] doesn't have Mounjaro in stock. She last took it last week. She does have a couple 10 mg injections at home. Her blood sugars ranges in 130's at night, 2 hour post prandial. Fasting blood sugars range between 90-99. Highest A1c was 10.8 on 12/04/2018, recently at 5.5 on 05/17/2022.  Assessment/Plan:  No orders of the defined types were placed in this encounter.   Medications Discontinued During This Encounter  Medication Reason   traZODone (DESYREL) 100 MG tablet Reorder     Meds ordered this encounter  Medications   traZODone (DESYREL) 100 MG tablet    Sig: Take 1 tablet (100 mg total) by mouth at  bedtime as needed for sleep.    Dispense:  30 tablet    Refill:  0    30 d supply;  ** OV for RF **   Do not send RF request     1. Other insomnia We will refill trazodone for 1 month.   - traZODone (DESYREL) 100 MG tablet; Take 1 tablet (100 mg total) by mouth at bedtime as needed for sleep.  Dispense: 30 tablet; Refill: 0  2. Type 2 diabetes mellitus with obesity (HCC) Strategies to locate the drug was discussed with the patient. Continue weight loss via prudent nutritional plan and exercise. Continue to monitor blood sugars. Follow-up with Endocrinology as scheduled.   3. Obesity, Current BMI 32.8 Sarah [name] is currently in the action stage of change. As such, her goal is to continue with weight loss efforts. She has agreed to the Category 1 Plan.   Exercise goals: As is. Add 2 days per week of walking or aerobics and weight lifting if possible.   Behavioral modification strategies: increasing lean protein intake, decreasing simple carbohydrates, and avoiding temptations.  Sarah [name] has agreed to follow-up with our clinic in 3 to 4 weeks. She was informed of the importance of frequent follow-up visits to maximize her success with intensive lifestyle modifications for her multiple health conditions.   Objective:   Blood pressure 113/80, pulse 69, temperature 98.4 F (36.9 C), height 5' 5"$  (1.651 m), weight 197 lb 6.4 oz (  89.5 kg), SpO2 100 %. Body mass index is 32.85 kg/m.  General: Cooperative, alert, well developed, in no acute distress. HEENT: Conjunctivae and lids unremarkable. Cardiovascular: Regular rhythm.  Lungs: Normal work of breathing. Neurologic: No focal deficits.   Lab Results  Component Value Date   CREATININE 1.22 (H) 06/21/2022   BUN 20 06/21/2022   NA 141 06/21/2022   K 4.2 06/21/2022   CL 102 06/21/2022   CO2 18 (L) 06/21/2022   Lab Results  Component Value Date   ALT 29 01/09/2022   AST 26 01/09/2022   ALKPHOS 74 01/09/2022   BILITOT 0.5 01/09/2022    Lab Results  Component Value Date   HGBA1C 5.5 05/17/2022   HGBA1C 5.5 01/09/2022   HGBA1C 5.7 10/04/2021   HGBA1C 6.2 (H) 06/26/2021   HGBA1C 7.0 (H) 03/07/2021   No results found for: "INSULIN" Lab Results  Component Value Date   TSH 1.370 06/21/2022   Lab Results  Component Value Date   CHOL 167 01/09/2022   HDL 45 01/09/2022   LDLCALC 89 01/09/2022   TRIG 192 (H) 01/09/2022   CHOLHDL 3.7 01/09/2022   Lab Results  Component Value Date   VD25OH 31.2 06/21/2022   VD25OH 35.7 10/22/2021   VD25OH 99.6 06/26/2021   Lab Results  Component Value Date   WBC 5.4 03/28/2022   HGB 15.0 03/28/2022   HCT 46.8 (H) 03/28/2022   MCV 94.2 03/28/2022   PLT 318 03/28/2022   Lab Results  Component Value Date   IRON 43 08/18/2010   TIBC 389 08/18/2010   FERRITIN 227 08/18/2010   Attestation Statements:   Reviewed by clinician on day of visit: allergies, medications, problem list, medical history, surgical history, family history, social history, and previous encounter notes.   Wilhemena Durie, am acting as transcriptionist for [employer], DO.  I have reviewed the above documentation for accuracy and completeness, and I agree with the above. Marjory Sneddon, D.O.  The [place] was signed into law in 2016 which includes the topic of electronic health records.  This provides immediate access to information in MyChart.  This includes consultation notes, operative notes, office notes, lab results and pathology reports.  If you have any questions about what you read please let us know at your next visit so we can discuss your concerns and take corrective action if need be.  We are right here with you.

## 2022-07-16 ENCOUNTER — Other Ambulatory Visit (HOSPITAL_COMMUNITY): Payer: Self-pay

## 2022-07-19 ENCOUNTER — Encounter: Payer: Self-pay | Admitting: Internal Medicine

## 2022-07-19 ENCOUNTER — Ambulatory Visit: Payer: Medicare HMO | Attending: Internal Medicine | Admitting: Internal Medicine

## 2022-07-19 VITALS — BP 115/74 | HR 72 | Temp 97.4°F | Ht 65.0 in | Wt 202.0 lb

## 2022-07-19 DIAGNOSIS — M545 Low back pain, unspecified: Secondary | ICD-10-CM | POA: Diagnosis not present

## 2022-07-19 DIAGNOSIS — M5136 Other intervertebral disc degeneration, lumbar region: Secondary | ICD-10-CM

## 2022-07-19 DIAGNOSIS — Z79891 Long term (current) use of opiate analgesic: Secondary | ICD-10-CM | POA: Diagnosis not present

## 2022-07-19 DIAGNOSIS — M5134 Other intervertebral disc degeneration, thoracic region: Secondary | ICD-10-CM

## 2022-07-19 DIAGNOSIS — G8929 Other chronic pain: Secondary | ICD-10-CM

## 2022-07-19 MED ORDER — TRAMADOL HCL 50 MG PO TABS: 50.0000 mg | ORAL_TABLET | Freq: Two times a day (BID) | ORAL | 0 refills | Status: DC | PRN

## 2022-07-19 NOTE — Progress Notes (Unsigned)
Patient ID: Sarah [name], female    DOB: 12-04-1965  MRN: [id number]  CC: Back Pain (Back pain appt made from 06/19/2022 patient message. Med refill/Already received flu vax. )   Subjective: Sarah [name] is a 57 y.o. 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 (S/p adjuvant chemotherapy completed on 12/08/14. S/p ex lap, hernia repair and small bowel resection for a presumed recurrence (benign pathology), Hypercalcemia with elevated free light chains (BM bx [name] negative, no need for further f/u), dx with [place] by [name], 1.1 cm RLL nodule (bx negative 01/2020), RT thyroid nodule (does not met criteria for bx 10/2019),   Pt's hx from last visit 05/17/2022 was as follows: Issues with back Burns sometimes mid to lower thoracic and lumbar region in the middle and surrounding paraspinal muscles Has to use heating pad several days a wk Constant.   No radiation down legs only one time down RT leg.  No nubmness tingling Worse with standing - cooking and washing dishes Does not feel bad when she works out most times but some days pain increases.  During workout classes at [facility] she  does some light wgh lifting of no more than 3 lbs, stretching, low impact aerobics Wears back support belt around her waist Pain started in 2016 when she was on chemo for ovarian CA.  Getting worse Rate pain 10/10 all the time.   Seen in ER 03/28/2022 for the same.  X-ray of lumbar spine revealed degenerative changes.   We ordered x-rays of the thoracic spine.  This showed degenerative changes as well.  We had her continue with Robaxin.  We added lidocaine patch.  Today:   Lioderm not helping much Pain more sharp in character and lasting longer Pain even when laying down Sometimes interrupts her  sleep. No radiation down legs Radiates up the back on both sides Numbness/tingling in feet but not new. Rates pain 9-10/10.  Hurts more after she works out.   Goes to gym 3 days a wk low impact aerobics - in [employer] Class    Patient Active Problem List   Diagnosis Date Noted   Essential hypertension 07/10/2022   BMI 32.0-32.9,adult-current bmi 32.9 07/10/2022   Multiple thyroid nodules 06/21/2022   Type 2 diabetes mellitus with obesity ([place]) 05/28/2022   Environmental allergies 05/28/2022   Class 2 severe obesity with serious comorbidity and body mass index (BMI) of 37.0 to 37.9 in adult ([place]) 05/28/2022   H/O gastroesophageal reflux (GERD) 05/28/2022   Hypomagnesemia 02/04/2022   SOB (shortness of breath) on exertion 02/04/2022   Mixed stress and urge urinary incontinence 10/04/2021   S/P hernia repair 07/03/2021   Atherosclerosis of aorta ([place]) 05/28/2021   Mood disorder ([place]) with emotional eating 05/21/2021   Cutaneous abscess of back excluding buttocks 01/12/2021   Depressed mood, with emotional eating 10/23/2020   Seasonal allergies 09/05/2020   Class 2 severe obesity with serious comorbidity and body mass index (BMI) of 36.0 to 36.9 in adult ([place]) 07/24/2020   At risk for impaired metabolic function [id number]   Hypertension associated with type 2 diabetes mellitus ([place]) 05/15/2020   Stage 3 chronic kidney disease ([place]) 05/01/2020   Type 2 diabetes mellitus with chronic kidney disease, with long-term current use of insulin ([place])  04/17/2020   At risk for hypoglycemia 04/17/2020   B12 deficiency 02/29/2020   Diabetes mellitus ([place]) 02/15/2020   Vitamin D deficiency 02/15/2020   Other insomnia 01/24/2020   Morbid obesity ([place]) 01/24/2020   Elevated serum immunoglobulin free light chains 01/21/2020   Thyroid nodule 10/14/2019   Renal insufficiency 06/24/2019   Hypercalcemia 06/24/2019   Hiatal hernia with GERD without esophagitis 06/18/2017   Need for influenza  vaccination 02/17/2017   Ventral hernia 01/24/2016   Type 2 diabetes mellitus without complication, without long-term current use of insulin ([place]) 10/04/2015   Obesity (BMI 30-39.9) 10/04/2015   Hyperlipidemia associated with type 2 diabetes mellitus ([place]) 07/27/2015   Neuropathic pain of both legs 07/27/2015   Obesity (BMI 30.0-34.9) 12/24/2014   Genetic testing 11/01/2014   Constipation - functional 09/24/2014   Chemotherapy-induced peripheral neuropathy ([place]) 09/24/2014   Midline low back pain without sciatica 09/24/2014   Family history of colon cancer    Hilar adenopathy    Hypertension associated with diabetes ([place])    Right lower lobe pulmonary nodule      Current Outpatient Medications on File Prior to Visit  Medication Sig Dispense Refill   ACCU-CHEK AVIVA PLUS test strip TEST BLOOD SUGAR AS DIRECTED 300 strip 3   Accu-Chek Softclix Lancets lancets TEST BLOOD SUGAR THREE TIMES DAILY 300 each 3   albuterol (VENTOLIN HFA) 108 (90 Base) MCG/ACT inhaler Inhale 2 puffs into the lungs every 6 (six) hours as needed for wheezing or shortness of breath. 8 g 0   amLODipine (NORVASC) 10 MG tablet TAKE 1 TABLET EVERY DAY 90 tablet 1   Blood Glucose Monitoring Suppl (ACCU-CHEK AVIVA PLUS) w/Device KIT USE AS DIRECTED 1 kit 0   buPROPion (WELLBUTRIN SR) 150 MG 12 hr tablet Take 1 tablet (150 mg total) by mouth 2 (two) times daily. 60 tablet 0   dorzolamide-timolol (COSOPT) 22.3-6.8 MG/ML ophthalmic solution Place 1 drop into both eyes 2 (two) times daily.     DROPLET PEN NEEDLES 29G X 12MM MISC USE AS DIRECTED 100 each 3   gabapentin (NEURONTIN) 600 MG tablet Take 1 tablet (600 mg total) by mouth 3 (three) times daily. 270 tablet 2   gatifloxacin (ZYMAXID) 0.5 % SOLN Place 1 drop into the right eye 4 (four) times daily as directed 5 mL 1   hydrALAZINE (APRESOLINE) 25 MG tablet TAKE 1 AND 1/2 TABLETS THREE TIMES DAILY 405 tablet 0   ketorolac (ACULAR) 0.5 % ophthalmic solution Place 1 drop  into the right eye 4 (four) times daily as directed 10 mL 1   Lancets Misc. (ACCU-CHEK SOFTCLIX LANCET DEV) KIT Check blood sugars 3 times a day. 3 kit 6   LANTUS SOLOSTAR 100 UNIT/ML Solostar Pen INJECT 30 UNITS DAILY. INCREASE BY 2 UNITS EVERY 3 DAYS UNTIL MORNING SUGAR IS LESS THAN 130. MAX OF 40 UNITS. (Patient taking differently: Inject 10 Units into the skin at bedtime.) 45 mL 1   latanoprost (XALATAN) 0.005 % ophthalmic solution Place 1 drop into both eyes at bedtime.     levocetirizine (XYZAL) 5 MG tablet Take 1 tablet (5 mg total) by mouth every evening. 30 tablet 0   lidocaine (LIDODERM) 5 % Place 1 patch onto the skin daily. Remove & Discard patch within 12 hours or as directed by MD 30 patch 1   losartan (COZAAR) 50 MG tablet Take 1 tablet (50 mg total) by mouth daily. 90 tablet 3   metFORMIN (GLUCOPHAGE) 500 MG tablet Take 1 tablet (  500 mg total) by mouth daily with breakfast. 90 tablet 1   methocarbamol (ROBAXIN) 500 MG tablet Take 1 tablet (500 mg total) by mouth 2 (two) times daily as needed for muscle spasms. 40 tablet 3   metoprolol tartrate (LOPRESSOR) 25 MG tablet TAKE 1/2 TABLET TWICE DAILY 90 tablet 1   montelukast (SINGULAIR) 10 MG tablet Take 1 tablet (10 mg total) by mouth at bedtime. 30 tablet 0   omeprazole (PRILOSEC) 20 MG capsule Take 1 capsule (20 mg total) by mouth daily. 30 capsule 3   prednisoLONE acetate (PRED FORTE) 1 % ophthalmic suspension Place 1 drop into the right eye 4 (four) times daily as directed 5 mL 1   rosuvastatin (CRESTOR) 20 MG tablet TAKE 1 TABLET EVERY DAY 90 tablet 0   solifenacin (VESICARE) 5 MG tablet TAKE 1 TABLET EVERY DAY 90 tablet 0   tirzepatide (MOUNJARO) 12.5 MG/0.5ML Pen Inject 12.5 mg into the skin once a week. 2 mL 0   traZODone (DESYREL) 100 MG tablet Take 1 tablet (100 mg total) by mouth at bedtime as needed for sleep. 30 tablet 0   vitamin B-12 (CYANOCOBALAMIN) 500 MCG tablet Take 1 tablet (500 mcg total) by mouth daily.      aspirin-sod bicarb-citric acid (ALKA-SELTZER) 325 MG TBEF tablet Take 650 mg by mouth every 6 (six) hours as needed (indigestion). (Patient not taking: Reported on 07/19/2022)     No current facility-administered medications on file prior to visit.    Allergies  Allergen Reactions   Emend [Aprepitant] Other (See Comments)    Urticaria    Lisinopril Cough    Social History   Socioeconomic History   Marital status: Married    Spouse name: Not on file   Number of children: 2   Years of education: Not on file   Highest education level: High school graduate  Occupational History   Occupation: disability  Tobacco Use   Smoking status: Never   Smokeless tobacco: Never  Vaping Use   Vaping Use: Never used  Substance and Sexual Activity   Alcohol use: No   Drug use: No   Sexual activity: Not Currently    Birth control/protection: None  Other Topics Concern   Not on file  Social History Narrative   No issues with transportation.    Attends church   Social Determinants of Health   Financial Resource Strain: Low Risk  (06/28/2022)   Overall Financial Resource Strain (CARDIA)    Difficulty of Paying Living Expenses: Not hard at all  Food Insecurity: No Food Insecurity (06/28/2022)   Hunger Vital Sign    Worried About Running Out of Food in the Last Year: Never true    Ran Out of Food in the Last Year: Never true  Transportation Needs: No Transportation Needs (06/28/2022)   PRAPARE - Hydrologist (Medical): No    Lack of Transportation (Non-Medical): No  Physical Activity: Sufficiently Active (06/28/2022)   Exercise Vital Sign    Days of Exercise per Week: 5 days    Minutes of Exercise per Session: 30 min  Stress: No Stress Concern Present (06/28/2022)   [place]    Feeling of Stress : Not at all  Social Connections: Moderately Isolated (06/28/2022)   Social Connection and Isolation  Panel [NHANES]    Frequency of Communication with Friends and Family: More than three times a week    Frequency of Social Gatherings with  Friends and Family: More than three times a week    Attends Religious Services: More than 4 times per year    Active Member of Clubs or Organizations: No    Attends Archivist Meetings: Never    Marital Status: Separated  Intimate Partner Violence: Not At Risk (06/28/2022)   Humiliation, Afraid, Rape, and Kick questionnaire    Fear of Current or Ex-Partner: No    Emotionally Abused: No    Physically Abused: No    Sexually Abused: No    Family History  Problem Relation Age of Onset   Hypertension Mother    Hyperlipidemia Mother    Stroke Mother    Thyroid disease Mother    Arthritis Mother    Hearing loss Mother    Hypertension Father    Diabetes Father    Hyperlipidemia Father    Sudden death Father    Arthritis Father    Heart disease Father    Obesity Father    Cancer Sister 10       fibrosarcoma (back); currently 59   Cancer Brother    Prostate cancer Maternal Uncle    Colon cancer Paternal Aunt        Dx 44s; deceased 25s   Prostate cancer Paternal Uncle        currently 66   Cancer Paternal Uncle 82       "bone" ; unk. primary   Stomach cancer Paternal Uncle    Hypercalcemia Neg Hx     Past Surgical History:  Procedure Laterality Date   ABDOMINAL HYSTERECTOMY     FOOT SURGERY  04/2018   [facility]   HERNIA REPAIR     INCISIONAL HERNIA REPAIR N/A 07/06/2015   Procedure: INCISIONAL HERNIA REPAIR ;  Surgeon: Rolm Bookbinder, MD;  Location: [facility];  Service: General;  Laterality: N/A;   [place] N/A 07/03/2021   Procedure: Fatima Blank HERNIA REPAIR WITH MESH;  Surgeon: Ralene Ok, MD;  Location: [facility];  Service: General;  Laterality: N/A;   INSERTION OF MESH N/A 07/06/2015   Procedure: WITH INSERTION OF PHASIX ST MESH;  Surgeon: Rolm Bookbinder, MD;  Location: [facility];  Service: General;   Laterality: N/A;   LAPAROTOMY N/A 07/06/2015   Procedure: EXPLORATORY LAPAROTOMY;  Surgeon: Everitt Amber, MD;  Location: [facility];  Service: Gynecology;  Laterality: N/A;   LYSIS OF ADHESION N/A 07/06/2015   Procedure:  LYSIS OF ADHESION RESECTION OF MESENTERIC MASS BOWEL RESECTION ;  Surgeon: Everitt Amber, MD;  Location: [facility];  Service: Gynecology;  Laterality: N/A;   LYSIS OF ADHESION N/A 07/03/2021   Procedure: EXPLORATORY LAPAROTOMY WITH LYSIS OF ADHESION;  Surgeon: Ralene Ok, MD;  Location: [facility];  Service: General;  Laterality: N/A;   ROBOTIC ASSISTED TOTAL HYSTERECTOMY WITH BILATERAL SALPINGO OOPHERECTOMY Bilateral 07/28/2014   Procedure: ROBOTIC ASSISTED lysis of adhesions with biopsies, converted to LAPAROTOMY, bilateral salpingoorphorectomy, omentectomy,appendectomy;  Surgeon: Janie Morning, MD;  Location: [facility];  Service: Gynecology;  Laterality: Bilateral;   THYROIDECTOMY, PARTIAL     VIDEO BRONCHOSCOPY N/A 01/13/2020   Procedure: VIDEO BRONCHOSCOPY WITH BIOPSIES;  Surgeon: Melrose Nakayama, MD;  Location: [facility];  Service: Thoracic;  Laterality: N/A;    ROS: Review of Systems Negative except as stated above  PHYSICAL EXAM: BP 115/74 (BP Location: Left Arm, Patient Position: Sitting, Cuff Size: Normal)   Pulse 72   Temp (!) 97.4 F (36.3 C) (Oral)   Ht 5' 5"$  (1.651 m)   Wt  202 lb (91.6 kg)   LMP  (LMP Unknown)   SpO2 100%   BMI 33.61 kg/m   Wt Readings from Last 3 Encounters:  07/19/22 202 lb (91.6 kg)  07/10/22 197 lb 9.6 oz (89.6 kg)  06/26/22 197 lb 6.4 oz (89.5 kg)    Physical Exam  {female adult master:310786} {female adult master:310785}     Latest Ref Rng & Units 06/21/2022    4:16 PM 03/28/2022    3:08 PM 01/09/2022    8:55 AM  CMP  Glucose 70 - 99 mg/dL 102  81  80   BUN 6 - 24 mg/dL 20  19  18   $ Creatinine 0.57 - 1.00 mg/dL 1.22  1.32  1.44   Sodium 134 - 144 mmol/L 141  139  142   Potassium 3.5 - 5.2 mmol/L 4.2  3.7  4.2   Chloride 96 -  106 mmol/L 102  106  103   CO2 20 - 29 mmol/L 18  26  23   $ Calcium 8.7 - 10.2 mg/dL 12.2  11.3  12.1   Total Protein 6.0 - 8.5 g/dL   7.4   Total Bilirubin 0.0 - 1.2 mg/dL   0.5   Alkaline Phos 44 - 121 IU/L   74   AST 0 - 40 IU/L   26   ALT 0 - 32 IU/L   29    Lipid Panel     Component Value Date/Time   CHOL 167 01/09/2022 0855   TRIG 192 (H) 01/09/2022 0855   HDL 45 01/09/2022 0855   CHOLHDL 3.7 01/09/2022 0855   CHOLHDL 5.1 (H) 08/14/2016 1729   VLDL 74 (H) 08/14/2016 1729   LDLCALC 89 01/09/2022 0855    CBC    Component Value Date/Time   WBC 5.4 03/28/2022 1508   RBC 4.97 03/28/2022 1508   HGB 15.0 03/28/2022 1508   HGB 13.3 02/03/2020 0837   HGB 13.6 12/04/2018 0914   HGB 12.9 04/22/2016 0750   HCT 46.8 (H) 03/28/2022 1508   HCT 41.0 12/04/2018 0914   HCT 39.0 04/22/2016 0750   PLT 318 03/28/2022 1508   PLT 270 02/03/2020 0837   PLT 332 12/04/2018 0914   MCV 94.2 03/28/2022 1508   MCV 88 12/04/2018 0914   MCV 92.2 04/22/2016 0750   MCH 30.2 03/28/2022 1508   MCHC 32.1 03/28/2022 1508   RDW 13.6 03/28/2022 1508   RDW 13.6 12/04/2018 0914   RDW 13.9 04/22/2016 0750   LYMPHSABS 2.4 03/28/2022 1508   LYMPHSABS 2.7 04/22/2016 0750   MONOABS 0.8 03/28/2022 1508   MONOABS 1.0 (H) 04/22/2016 0750   EOSABS 0.1 03/28/2022 1508   EOSABS 0.1 04/22/2016 0750   BASOSABS 0.0 03/28/2022 1508   BASOSABS 0.0 04/22/2016 0750    ASSESSMENT AND PLAN:  There are no diagnoses linked to this encounter.   Patient was given the opportunity to ask questions.  Patient verbalized understanding of the plan and was able to repeat key elements of the plan.   This documentation was completed using Radio producer.  Any transcriptional errors are unintentional.  No orders of the defined types were placed in this encounter.    Requested Prescriptions    No prescriptions requested or ordered in this encounter    No follow-ups on file.  Karle Plumber,  MD, FACP

## 2022-07-21 DIAGNOSIS — Z79891 Long term (current) use of opiate analgesic: Secondary | ICD-10-CM | POA: Insufficient documentation

## 2022-07-21 DIAGNOSIS — M51369 Other intervertebral disc degeneration, lumbar region without mention of lumbar back pain or lower extremity pain: Secondary | ICD-10-CM | POA: Insufficient documentation

## 2022-07-21 DIAGNOSIS — M5136 Other intervertebral disc degeneration, lumbar region: Secondary | ICD-10-CM | POA: Insufficient documentation

## 2022-07-21 DIAGNOSIS — M5134 Other intervertebral disc degeneration, thoracic region: Secondary | ICD-10-CM | POA: Insufficient documentation

## 2022-07-24 ENCOUNTER — Other Ambulatory Visit: Payer: Self-pay | Admitting: Internal Medicine

## 2022-07-25 ENCOUNTER — Encounter (INDEPENDENT_AMBULATORY_CARE_PROVIDER_SITE_OTHER): Payer: Self-pay | Admitting: Family Medicine

## 2022-07-25 ENCOUNTER — Ambulatory Visit (INDEPENDENT_AMBULATORY_CARE_PROVIDER_SITE_OTHER): Payer: Medicare HMO | Admitting: Family Medicine

## 2022-07-25 VITALS — BP 125/86 | HR 63 | Temp 97.5°F | Ht 65.0 in | Wt 194.8 lb

## 2022-07-25 DIAGNOSIS — Z7985 Long-term (current) use of injectable non-insulin antidiabetic drugs: Secondary | ICD-10-CM

## 2022-07-25 DIAGNOSIS — E1169 Type 2 diabetes mellitus with other specified complication: Secondary | ICD-10-CM

## 2022-07-25 DIAGNOSIS — E669 Obesity, unspecified: Secondary | ICD-10-CM

## 2022-07-25 DIAGNOSIS — Z6832 Body mass index (BMI) 32.0-32.9, adult: Secondary | ICD-10-CM | POA: Diagnosis not present

## 2022-07-25 MED ORDER — TIRZEPATIDE 12.5 MG/0.5ML ~~LOC~~ SOAJ: 12.5000 mg | SUBCUTANEOUS | 0 refills | Status: DC

## 2022-07-26 ENCOUNTER — Other Ambulatory Visit (HOSPITAL_COMMUNITY): Payer: Self-pay

## 2022-07-26 MED ORDER — DORZOLAMIDE HCL-TIMOLOL MAL 2-0.5 % OP SOLN
1.0000 [drp] | Freq: Two times a day (BID) | OPHTHALMIC | 3 refills | Status: AC
  Filled 2022-07-26: qty 10, 50d supply, fill #0

## 2022-07-26 MED ORDER — LATANOPROST 0.005 % OP SOLN
1.0000 [drp] | Freq: Every evening | OPHTHALMIC | 6 refills | Status: AC
  Filled 2022-07-26: qty 2.5, 25d supply, fill #0

## 2022-07-29 ENCOUNTER — Other Ambulatory Visit: Payer: Self-pay | Admitting: Internal Medicine

## 2022-07-29 DIAGNOSIS — I10 Essential (primary) hypertension: Secondary | ICD-10-CM

## 2022-07-30 NOTE — Progress Notes (Unsigned)
Chief Complaint:   OBESITY Sarah [name] is here to discuss her progress with her obesity treatment plan along with follow-up of her obesity related diagnoses. Sarah [name] is on the Category 1 Plan and states she is following her eating plan approximately 95% of the time. Sarah [name] states she is going to gym/aerobics/weights 45 minutes 3 times per week.  Today's visit was #: 72 Starting weight: 9 LBS Starting date: 02/15/2020 Today's weight: 197 LBS Today's date: 07/10/2022 Total lbs lost to date: 31 LBS Total lbs lost since last in-office visit: 0  Interim History: Sarah [name] is here for a follow up office visit.  We reviewed her meal plan and all questions were answered.  Patient's food recall appears to be accurate and consistent with what is on plan when she is following it.   When eating on plan, her hunger and cravings are well controlled.     Subjective:   1. Type 2 diabetes mellitus with obesity (HCC) A1c 1 month ago with PCP-5.5 on 05/17/22.  She got Sarah [name] continues glucose monitor without lows or highs.    2. Depressed mood ([place]) with emotional eating Tolerating Wellbutrin well without sleep issues.  Gets 7-9 hours a night.  She colors for stress management, reads and listens to calming music and exercise.  3. Environmental allergies No longer coughing.  Doing very well on current medication.    Assessment/Plan:  No orders of the defined types were placed in this encounter.   Medications Discontinued During This Encounter  Medication Reason   buPROPion (WELLBUTRIN SR) 150 MG 12 hr tablet Reorder   levocetirizine (XYZAL) 5 MG tablet Reorder   montelukast (SINGULAIR) 10 MG tablet Reorder     Meds ordered this encounter  Medications   buPROPion (WELLBUTRIN SR) 150 MG 12 hr tablet    Sig: Take 1 tablet (150 mg total) by mouth 2 (two) times daily.    Dispense:  60 tablet    Refill:  0   levocetirizine (XYZAL) 5 MG tablet    Sig: Take 1 tablet (5 mg total) by mouth every  evening.    Dispense:  30 tablet    Refill:  0   montelukast (SINGULAIR) 10 MG tablet    Sig: Take 1 tablet (10 mg total) by mouth at bedtime.    Dispense:  30 tablet    Refill:  0     1. Type 2 diabetes mellitus with obesity ([place]) Back on Mounjaro- has not started the 12.5 mg yet, she will start on Thursday.  Notes cravings.  2. Depressed mood (HCC) with emotional eating Will refill Wellbutrin SR 150 mg daily for 1 month with 0 refills.  Continue self care activities.  -Refill buPROPion (WELLBUTRIN SR) 150 MG 12 hr tablet; Take 1 tablet (150 mg total) by mouth 2 (two) times daily.  Dispense: 60 tablet; Refill: 0  3. Environmental allergies Will refill Xyzal 5 mg daily AND refill Singulair 10 mg daily for 1 month with 0 refills.  -Refill levocetirizine (XYZAL) 5 MG tablet; Take 1 tablet (5 mg total) by mouth every evening.  Dispense: 30 tablet; Refill: 0  -Refill montelukast (SINGULAIR) 10 MG tablet; Take 1 tablet (10 mg total) by mouth at bedtime.  Dispense: 30 tablet; Refill: 0  4. BMI 32.0-32.9,adult-current bmi 32.9  5. Morbid obesity (HCC)-starting bmi 37.94 Sarah [name] is currently in the action stage of change. As such, her goal is to continue with weight loss efforts. She has agreed to the Category  1 Plan.   Exercise goals: As is but increase as tolerated.  Working out with a Physiological scientist 3 times a week to help build muscles.  Continue cardio 3 days a week.  Patient will pay attention closer to snack calories.  Behavioral modification strategies: increasing lean protein intake and decreasing simple carbohydrates.  Sarah [name] has agreed to follow-up with our clinic in 2 weeks. She was informed of the importance of frequent follow-up visits to maximize her success with intensive lifestyle modifications for her multiple health conditions.   Objective:   Blood pressure 121/64, pulse 70, temperature 98.1 F (36.7 C), height 5' 5"$  (1.651 m), weight 197 lb 9.6 oz (89.6 kg), SpO2 99  %. Body mass index is 32.88 kg/m.  General: Cooperative, alert, well developed, in no acute distress. HEENT: Conjunctivae and lids unremarkable. Cardiovascular: Regular rhythm.  Lungs: Normal work of breathing. Neurologic: No focal deficits.   Lab Results  Component Value Date   CREATININE 1.22 (H) 06/21/2022   BUN 20 06/21/2022   NA 141 06/21/2022   K 4.2 06/21/2022   CL 102 06/21/2022   CO2 18 (L) 06/21/2022   Lab Results  Component Value Date   ALT 29 01/09/2022   AST 26 01/09/2022   ALKPHOS 74 01/09/2022   BILITOT 0.5 01/09/2022   Lab Results  Component Value Date   HGBA1C 5.5 05/17/2022   HGBA1C 5.5 01/09/2022   HGBA1C 5.7 10/04/2021   HGBA1C 6.2 (H) 06/26/2021   HGBA1C 7.0 (H) 03/07/2021   No results found for: "INSULIN" Lab Results  Component Value Date   TSH 1.370 06/21/2022   Lab Results  Component Value Date   CHOL 167 01/09/2022   HDL 45 01/09/2022   LDLCALC 89 01/09/2022   TRIG 192 (H) 01/09/2022   CHOLHDL 3.7 01/09/2022   Lab Results  Component Value Date   VD25OH 31.2 06/21/2022   VD25OH 35.7 10/22/2021   VD25OH 99.6 06/26/2021   Lab Results  Component Value Date   WBC 5.4 03/28/2022   HGB 15.0 03/28/2022   HCT 46.8 (H) 03/28/2022   MCV 94.2 03/28/2022   PLT 318 03/28/2022   Lab Results  Component Value Date   IRON 43 08/18/2010   TIBC 389 08/18/2010   FERRITIN 227 08/18/2010   Attestation Statements:   Reviewed by clinician on day of visit: allergies, medications, problem list, medical history, surgical history, family history, social history, and previous encounter notes.  I, Brendell Tyus, am acting as Location manager for [employer], DO.   I have reviewed the above documentation for accuracy and completeness, and I agree with the above. Sarah [name], D.O.  The [place] was signed into law in 2016 which includes the topic of electronic health records.  This provides immediate access to information in  MyChart.  This includes consultation notes, operative notes, office notes, lab results and pathology reports.  If you have any questions about what you read please let us know at your next visit so we can discuss your concerns and take corrective action if need be.  We are right here with you.

## 2022-08-07 NOTE — Progress Notes (Signed)
Chief Complaint:   OBESITY Sarah [name] is here to discuss her progress with her obesity treatment plan along with follow-up of her obesity related diagnoses. [name] is on the Category 1 Plan with breakfast and lunch options and states she is following her eating plan approximately 50% of the time. [name] states she is Silver sneakers 45 minutes 3 times per week.  Today's visit was #: 30 Starting weight: 59 LBS Starting date: 02/15/2020 Today's weight: 194 LBS Today's date: 07/25/2022 Total lbs lost to date: 34 LBS Total lbs lost since last in-office visit: 3 LBS  Interim History: Patient has been skipping breakfast and lunch she will pick last week and did not eat much for a few days.  But she is back on track and has no issues or concerns with meal plan.  March 28 she is having cataract surgery to her left eye and on April 18 she will be having right eye surgery.  Subjective:   1. Type 2 diabetes mellitus with obesity (HCC) Blood sugars are stable she received a continuous glucose monitor, lowest blood sugar 71 time 57.  Highest 130.  On 05/17/2022 A1c was 5.5.  Patient is asymptomatic.  Assessment/Plan:  No orders of the defined types were placed in this encounter.   Medications Discontinued During This Encounter  Medication Reason   tirzepatide [facility]) 12.5 MG/0.5ML Pen Reorder     Meds ordered this encounter  Medications   tirzepatide (MOUNJARO) 12.5 MG/0.5ML Pen    Sig: Inject 12.5 mg into the skin once a week.    Dispense:  2 mL    Refill:  0     1. Type 2 diabetes mellitus with obesity (HCC) No need for change in dose of Mounjaro.  Continue other medications, Lantus, metformin and wean insulin as tolerated.  Continue weight loss via PNP and increase exercise.  Refill- tirzepatide (MOUNJARO) 12.5 MG/0.5ML Pen; Inject 12.5 mg into the skin once a week.  Dispense: 2 mL; Refill: 0  2. BMI 32.0-32.9,adult-current bmi 32.4  3. Morbid obesity (HCC)-starting bmi  37.94 Sarah [name] is currently in the action stage of change. As such, her goal is to continue with weight loss efforts. She has agreed to the Category 1 Plan with breakfast and lunch options.  Exercise goals:  As is, but increase activity as tolerated.  Behavioral modification strategies: increasing lean protein intake, no skipping meals, and planning for success.  Sarah [name] has agreed to follow-up with our clinic in 3-4 weeks. She was informed of the importance of frequent follow-up visits to maximize her success with intensive lifestyle modifications for her multiple health conditions.   Objective:   Blood pressure 125/86, pulse 63, temperature (!) 97.5 F (36.4 C), height '5\' 5"'$  (1.651 m), weight 194 lb 12.8 oz (88.4 kg), SpO2 97 %. Body mass index is 32.42 kg/m.  General: Cooperative, alert, well developed, in no acute distress. HEENT: Conjunctivae and lids unremarkable. Cardiovascular: Regular rhythm.  Lungs: Normal work of breathing. Neurologic: No focal deficits.   Lab Results  Component Value Date   CREATININE 1.22 (H) 06/21/2022   BUN 20 06/21/2022   NA 141 06/21/2022   K 4.2 06/21/2022   CL 102 06/21/2022   CO2 18 (L) 06/21/2022   Lab Results  Component Value Date   ALT 29 01/09/2022   AST 26 01/09/2022   ALKPHOS 74 01/09/2022   BILITOT 0.5 01/09/2022   Lab Results  Component Value Date   HGBA1C 5.5 05/17/2022   HGBA1C 5.5  01/09/2022   HGBA1C 5.7 10/04/2021   HGBA1C 6.2 (H) 06/26/2021   HGBA1C 7.0 (H) 03/07/2021   No results found for: "INSULIN" Lab Results  Component Value Date   TSH 1.370 06/21/2022   Lab Results  Component Value Date   CHOL 167 01/09/2022   HDL 45 01/09/2022   LDLCALC 89 01/09/2022   TRIG 192 (H) 01/09/2022   CHOLHDL 3.7 01/09/2022   Lab Results  Component Value Date   VD25OH 31.2 06/21/2022   VD25OH 35.7 10/22/2021   VD25OH 99.6 06/26/2021   Lab Results  Component Value Date   WBC 5.4 03/28/2022   HGB 15.0 03/28/2022   HCT  46.8 (H) 03/28/2022   MCV 94.2 03/28/2022   PLT 318 03/28/2022   Lab Results  Component Value Date   IRON 43 08/18/2010   TIBC 389 08/18/2010   FERRITIN 227 08/18/2010   Attestation Statements:   Reviewed by clinician on day of visit: allergies, medications, problem list, medical history, surgical history, family history, social history, and previous encounter notes.  I, Davy Pique, RMA, am acting as Location manager for [employer], DO.  I have reviewed the above documentation for accuracy and completeness, and I agree with the above. Marjory Sneddon, D.O.  The [place] was signed into law in 2016 which includes the topic of electronic health records.  This provides immediate access to information in MyChart.  This includes consultation notes, operative notes, office notes, lab results and pathology reports.  If you have any questions about what you read please let us know at your next visit so we can discuss your concerns and take corrective action if need be.  We are right here with you.

## 2022-08-08 ENCOUNTER — Ambulatory Visit (INDEPENDENT_AMBULATORY_CARE_PROVIDER_SITE_OTHER): Payer: Medicare HMO | Admitting: Family Medicine

## 2022-08-13 ENCOUNTER — Ambulatory Visit (INDEPENDENT_AMBULATORY_CARE_PROVIDER_SITE_OTHER): Payer: Medicare HMO | Admitting: Family Medicine

## 2022-08-13 ENCOUNTER — Encounter (INDEPENDENT_AMBULATORY_CARE_PROVIDER_SITE_OTHER): Payer: Self-pay

## 2022-08-13 ENCOUNTER — Encounter (INDEPENDENT_AMBULATORY_CARE_PROVIDER_SITE_OTHER): Payer: Self-pay | Admitting: Family Medicine

## 2022-08-13 VITALS — BP 105/72 | HR 65 | Temp 98.0°F | Ht 65.0 in | Wt 196.0 lb

## 2022-08-13 DIAGNOSIS — E1122 Type 2 diabetes mellitus with diabetic chronic kidney disease: Secondary | ICD-10-CM | POA: Diagnosis not present

## 2022-08-13 DIAGNOSIS — Z6832 Body mass index (BMI) 32.0-32.9, adult: Secondary | ICD-10-CM

## 2022-08-13 DIAGNOSIS — E669 Obesity, unspecified: Secondary | ICD-10-CM | POA: Diagnosis not present

## 2022-08-13 DIAGNOSIS — Z794 Long term (current) use of insulin: Secondary | ICD-10-CM | POA: Diagnosis not present

## 2022-08-13 DIAGNOSIS — F39 Unspecified mood [affective] disorder: Secondary | ICD-10-CM

## 2022-08-13 MED ORDER — BUPROPION HCL ER (SR) 150 MG PO TB12: 150.0000 mg | ORAL_TABLET | Freq: Two times a day (BID) | ORAL | 0 refills | Status: DC

## 2022-08-15 ENCOUNTER — Other Ambulatory Visit: Payer: Self-pay | Admitting: Internal Medicine

## 2022-08-15 DIAGNOSIS — M545 Low back pain, unspecified: Secondary | ICD-10-CM

## 2022-08-15 DIAGNOSIS — G8929 Other chronic pain: Secondary | ICD-10-CM

## 2022-08-15 NOTE — Telephone Encounter (Signed)
Requested Prescriptions  Pending Prescriptions Disp Refills   lidocaine (LIDODERM) 5 % [Pharmacy Med Name: LIDOCAINE 5 % Patch] 30 patch 1    Sig: PLACE 1 PATCH ONTO THE SKIN DAILY. REMOVE AND DISCARD PATCH WITHIN 12 HOURS OR AS DIRECTED BY MD     Analgesics:  Topicals Failed - 08/15/2022  4:39 AM      Failed - Manual Review: Labs are only required if the patient has taken medication for more than 8 weeks.      Failed - HCT in normal range and within 360 days    HCT  Date Value Ref Range Status  03/28/2022 46.8 (H) 36.0 - 46.0 % Final  04/22/2016 39.0 34.8 - 46.6 % Final   Hematocrit  Date Value Ref Range Status  12/04/2018 41.0 34.0 - 46.6 % Final         Failed - Cr in normal range and within 360 days    Creatinine  Date Value Ref Range Status  12/29/2019 1.26 (H) 0.44 - 1.00 mg/dL Final  11/20/2016 1.1 0.6 - 1.1 mg/dL Final   Creatinine, Ser  Date Value Ref Range Status  06/21/2022 1.22 (H) 0.57 - 1.00 mg/dL Final   Creatinine, POC  Date Value Ref Range Status  11/13/2016 100 mg/dL Final   Creatinine, Urine  Date Value Ref Range Status  07/27/2015 174 20 - 320 mg/dL Final         Passed - PLT in normal range and within 360 days    Platelets  Date Value Ref Range Status  03/28/2022 318 150 - 400 K/uL Final  12/04/2018 332 150 - 450 x10E3/uL Final   Platelet Count  Date Value Ref Range Status  02/03/2020 270 150 - 400 K/uL Final         Passed - HGB in normal range and within 360 days    Hemoglobin  Date Value Ref Range Status  03/28/2022 15.0 12.0 - 15.0 g/dL Final  02/03/2020 13.3 12.0 - 15.0 g/dL Final  12/04/2018 13.6 11.1 - 15.9 g/dL Final   HGB  Date Value Ref Range Status  04/22/2016 12.9 11.6 - 15.9 g/dL Final         Passed - eGFR is 30 or above and within 360 days    GFR, Est African American  Date Value Ref Range Status  07/08/2014 73 mL/min Final   GFR, Est AFR Am  Date Value Ref Range Status  12/29/2019 56 (L) >60 mL/min Final   GFR  calc Af Amer  Date Value Ref Range Status  04/17/2020 69 >59 mL/min/1.73 Final    Comment:    **In accordance with recommendations from the NKF-ASN Task force,**   [facility] is in the process of updating its eGFR calculation to the   2021 CKD-EPI creatinine equation that estimates kidney function   without a race variable.    GFR, Est Non African American  Date Value Ref Range Status  07/08/2014 63 mL/min Final    Comment:      The estimated GFR is a calculation valid for adults (>=107 years old) that uses the CKD-EPI algorithm to adjust for age and sex. It is   not to be used for children, pregnant women, hospitalized patients,    patients on dialysis, or with rapidly changing kidney function. According to the NKDEP, eGFR >89 is normal, 60-89 shows mild impairment, 30-59 shows moderate impairment, 15-29 shows severe impairment and <15 is ESRD.      GFR, Estimated  Date Value Ref Range Status  03/28/2022 47 (L) >60 mL/min Final    Comment:    (NOTE) Calculated using the CKD-EPI Creatinine Equation (2021)   12/29/2019 48 (L) >60 mL/min Final   eGFR  Date Value Ref Range Status  06/21/2022 52 (L) >59 mL/min/1.73 Final         Passed - Patient is not pregnant      Passed - Valid encounter within last 12 months    Recent Outpatient Visits           3 weeks ago Chronic bilateral thoracic back pain   [place], MD   3 months ago Type 2 diabetes mellitus with obesity [facility])   [place] Karle Plumber B, MD   7 months ago Type 2 diabetes mellitus with obesity [facility])   [place] Karle Plumber B, MD   10 months ago Type 2 diabetes mellitus with obesity [facility])   [place] Karle Plumber B, MD   1 year ago Type 2 diabetes mellitus with morbid obesity [facility])   [place], [place]       Future Appointments             In 1 month Wynetta Emery, Dalbert Batman, MD [place]

## 2022-08-22 ENCOUNTER — Ambulatory Visit (INDEPENDENT_AMBULATORY_CARE_PROVIDER_SITE_OTHER): Payer: Medicare HMO | Admitting: Family Medicine

## 2022-08-27 NOTE — Progress Notes (Signed)
Chief Complaint:   OBESITY Sarah [name] is here to discuss her progress with her obesity treatment plan along with follow-up of her obesity related diagnoses. Sarah [name] is on the Category 1 Plan with breakfast and lunch options and states she is following her eating plan approximately 80% of the time. Sarah [name] states she is at the [facility] for 45 minutes 3 times per week.  Today's visit was #: 3 Starting weight: 228 lbs Starting date: 02/15/2020 Today's weight: 196 lbs Today's date: 08/13/2022 Total lbs lost to date: 32 Total lbs lost since last in-office visit: 0  Interim History: Sarah [name] is retaining some water weight today. She is doing much overall with decreasing simple carbohydrates and increasing exercise. She is increasing lean protein but bored at times.   Subjective:   1. Type 2 diabetes mellitus with chronic kidney disease, with long-term current use of insulin, unspecified CKD stage (Sarah [name]) Sarah [name] states having more glucose fluctuations. Her HgbA1c was 5.5 in August 2023. She is having lows in the 60-70's overnight.   2. Depressed mood ([place]) with emotional eating Sarah [name] is doing well with decreasing emotional eating behaviors. She notes some insomnia at night however.   Assessment/Plan:   1. Type 2 diabetes mellitus with chronic kidney disease, with long-term current use of insulin, unspecified CKD stage (Sarah [name]) Sarah [name] is to decrease her Lantus to 5 units, and discuss with her PCP. She may be ready to discontinue Lantus all together.   2. Depressed mood (HCC) with emotional eating We will refill Wellbutrin SR for 1 month. Sarah [name] is ok to take a 6 AM and noon dose to help decrease insomnia).   - buPROPion (WELLBUTRIN SR) 150 MG 12 hr tablet; Take 1 tablet (150 mg total) by mouth 2 (two) times daily.  Dispense: 60 tablet; Refill: 0  3. Obesity with starting BMI 37.9  4. Current BMI 32.62 Sarah [name] is currently in the action stage of change. As such, her goal is to continue with weight  loss efforts. She has agreed to the Category 1 Plan.   Exercise goals: As is.   Behavioral modification strategies: better snacking choices and emotional eating strategies.  Sarah [name] has agreed to follow-up with our clinic in 4 weeks. She was informed of the importance of frequent follow-up visits to maximize her success with intensive lifestyle modifications for her multiple health conditions.   Objective:   Blood pressure 105/72, pulse 65, temperature 98 F (36.7 C), height 5\' 5"  (1.651 m), weight 196 lb (88.9 kg), SpO2 95 %. Body mass index is 32.62 kg/m.  Lab Results  Component Value Date   CREATININE 1.22 (H) 06/21/2022   BUN 20 06/21/2022   NA 141 06/21/2022   K 4.2 06/21/2022   CL 102 06/21/2022   CO2 18 (L) 06/21/2022   Lab Results  Component Value Date   ALT 29 01/09/2022   AST 26 01/09/2022   ALKPHOS 74 01/09/2022   BILITOT 0.5 01/09/2022   Lab Results  Component Value Date   HGBA1C 5.5 05/17/2022   HGBA1C 5.5 01/09/2022   HGBA1C 5.7 10/04/2021   HGBA1C 6.2 (H) 06/26/2021   HGBA1C 7.0 (H) 03/07/2021   No results found for: "INSULIN" Lab Results  Component Value Date   TSH 1.370 06/21/2022   Lab Results  Component Value Date   CHOL 167 01/09/2022   HDL 45 01/09/2022   LDLCALC 89 01/09/2022   TRIG 192 (H) 01/09/2022   CHOLHDL 3.7 01/09/2022   Lab Results  Component Value Date  VD25OH 31.2 06/21/2022   VD25OH 35.7 10/22/2021   VD25OH 99.6 06/26/2021   Lab Results  Component Value Date   WBC 5.4 03/28/2022   HGB 15.0 03/28/2022   HCT 46.8 (H) 03/28/2022   MCV 94.2 03/28/2022   PLT 318 03/28/2022   Lab Results  Component Value Date   IRON 43 08/18/2010   TIBC 389 08/18/2010   FERRITIN 227 08/18/2010   Attestation Statements:   Reviewed by clinician on day of visit: allergies, medications, problem list, medical history, surgical history, family history, social history, and previous encounter notes.   I, Trixie Dredge, am acting as  transcriptionist for Dennard Nip, MD.  I have reviewed the above documentation for accuracy and completeness, and I agree with the above. -  Dennard Nip, MD

## 2022-08-28 ENCOUNTER — Encounter (INDEPENDENT_AMBULATORY_CARE_PROVIDER_SITE_OTHER): Payer: Self-pay | Admitting: Family Medicine

## 2022-08-28 ENCOUNTER — Ambulatory Visit (INDEPENDENT_AMBULATORY_CARE_PROVIDER_SITE_OTHER): Payer: Medicare HMO | Admitting: Family Medicine

## 2022-08-28 VITALS — BP 137/88 | HR 71 | Temp 97.7°F | Ht 65.0 in | Wt 195.2 lb

## 2022-08-28 DIAGNOSIS — E1169 Type 2 diabetes mellitus with other specified complication: Secondary | ICD-10-CM

## 2022-08-28 DIAGNOSIS — E669 Obesity, unspecified: Secondary | ICD-10-CM

## 2022-08-28 DIAGNOSIS — Z9109 Other allergy status, other than to drugs and biological substances: Secondary | ICD-10-CM | POA: Diagnosis not present

## 2022-08-28 DIAGNOSIS — K219 Gastro-esophageal reflux disease without esophagitis: Secondary | ICD-10-CM | POA: Insufficient documentation

## 2022-08-28 DIAGNOSIS — Z7985 Long-term (current) use of injectable non-insulin antidiabetic drugs: Secondary | ICD-10-CM | POA: Diagnosis not present

## 2022-08-28 DIAGNOSIS — Z6832 Body mass index (BMI) 32.0-32.9, adult: Secondary | ICD-10-CM | POA: Diagnosis not present

## 2022-08-28 DIAGNOSIS — Z794 Long term (current) use of insulin: Secondary | ICD-10-CM | POA: Diagnosis not present

## 2022-08-28 DIAGNOSIS — Z6837 Body mass index (BMI) 37.0-37.9, adult: Secondary | ICD-10-CM | POA: Insufficient documentation

## 2022-08-28 DIAGNOSIS — Z7984 Long term (current) use of oral hypoglycemic drugs: Secondary | ICD-10-CM | POA: Diagnosis not present

## 2022-08-28 DIAGNOSIS — E66811 Obesity, class 1: Secondary | ICD-10-CM | POA: Insufficient documentation

## 2022-08-28 MED ORDER — TIRZEPATIDE 15 MG/0.5ML ~~LOC~~ SOAJ: 15.0000 mg | SUBCUTANEOUS | 0 refills | Status: DC

## 2022-08-28 MED ORDER — MONTELUKAST SODIUM 10 MG PO TABS: 10.0000 mg | ORAL_TABLET | Freq: Every day | ORAL | 0 refills | Status: DC

## 2022-08-28 MED ORDER — OMEPRAZOLE 20 MG PO CPDR: 20.0000 mg | DELAYED_RELEASE_CAPSULE | Freq: Every day | ORAL | 0 refills | Status: DC

## 2022-08-28 MED ORDER — LEVOCETIRIZINE DIHYDROCHLORIDE 5 MG PO TABS: 5.0000 mg | ORAL_TABLET | Freq: Every evening | ORAL | 0 refills | Status: DC

## 2022-08-28 NOTE — Progress Notes (Signed)
Marjory Sneddon, D.O.  ABFM, ABOM Specializing in Clinical Bariatric Medicine  Office located at: [address], [place]  [address]     Assessment and Plan:   No orders of the defined types were placed in this encounter.   Medications Discontinued During This Encounter  Medication Reason   LANTUS SOLOSTAR 100 UNIT/ML Solostar Pen Discontinued by provider   tirzepatide (MOUNJARO) 12.5 MG/0.5ML Pen    omeprazole (PRILOSEC) 20 MG capsule Reorder   levocetirizine (XYZAL) 5 MG tablet Reorder   montelukast (SINGULAIR) 10 MG tablet Reorder     Meds ordered this encounter  Medications   levocetirizine (XYZAL) 5 MG tablet    Sig: Take 1 tablet (5 mg total) by mouth every evening.    Dispense:  30 tablet    Refill:  0   montelukast (SINGULAIR) 10 MG tablet    Sig: Take 1 tablet (10 mg total) by mouth at bedtime.    Dispense:  30 tablet    Refill:  0   omeprazole (PRILOSEC) 20 MG capsule    Sig: Take 1 capsule (20 mg total) by mouth daily.    Dispense:  30 capsule    Refill:  0   tirzepatide (MOUNJARO) 15 MG/0.5ML Pen    Sig: Inject 15 mg into the skin once a week.    Dispense:  2 mL    Refill:  0      Obesity with starting BMI 37.9 Current BMI 32.62 Assessment: Condition is Improving, but not optimized.. Biometric data collected today, was reviewed with patient.  Fat mass has decreased by 2.6lb. Muscle mass has increased by .8lbs Total body water has decreased by 1lb.   Plan: Continue with Category 1 meal plan.   Management strategies discussed on successive visits include dietary and exercise recommendations, goals of achieving and maintaining BMI < 30, and lifestyle modifications aiming for adequate sleep, exercise and minimizing stressors.     Type 2 diabetes mellitus with chronic kidney disease, with long-term current use of insulin, unspecified CKD stage (HCC)  Assessment: Condition is Controlled..  Lab Results  Component Value Date   HGBA1C 5.5  05/17/2022   HGBA1C 5.5 01/09/2022   HGBA1C 5.7 10/04/2021  Her blood sugars have been consistent and averaging in the 90s for the past month. On 08/12/21 , Dr.Beasely decreased her Lantus from 10 units to 5 units. Patient compliant with Metformin 500mg  daily and Mounjaro 12.5 mg weekly, denies any side effects.  Plan: Discontinue Lantus 5 units. Increase Mounjaro from 12.5mg  to 15 mg to help with her hunger at night.  - Aydriana will continue to work on weight loss, exercise, via their meal plan we devised to help decrease the risk of progressing to diabetes.    Environmental allergies Assessment: Condition is Controlled. Patient is compliant with Singulair 10 mg daily and Xyzal 5 mg daily, denies any side effects.   Plan: Continue and refill Singulair 10 mg daily and Xyzal 5 mg daily. I reviewed preventative strategies with her for her environmental allergies. I also advised her to use neil med sinus rinse, Flonase, and washing her hair and body after prolonged exposure.   Gastroesophageal reflux disease, unspecified whether esophagitis present Assessment: Condition is Controlled.   She states that her reflux is well controlled  She is compliant with her Prilosec 20mg  daily, denies any side effects. She sometimes wakes up with a metallic taste in mouth from the Priolsec  on some days.    Plan:  Continue and refill  Prilosec 20 mg daily.   I recommended her to not lay down for 3-4 hours after eating, to avoid caffeine, and to elevate the headboard of her bed when she sleeps to help with her GERD>  TREATMENT PLAN FOR OBESITY:  Recommended Dietary Goals Getrude is currently in the action stage of change. As such, her goal is to continue weight management plan. She has agreed to continue the Category 1 Plan.  Behavioral Intervention Additional resources provided today: patient declined Evidence-based interventions for health behavior change were utilized today including the discussion of self  monitoring techniques, problem-solving barriers and SMART goal setting techniques.   Regarding patient's less desirable eating habits and patterns, we employed the technique of small changes.  Pt will specifically work on: increase her current walking regiment to 2 more days, for 20 minutes.   Recommended Physical Activity Goals Afreen has been advised to work up to 150 minutes of moderate intensity aerobic activity a week and strengthening exercises 2-3 times per week for cardiovascular health, weight loss maintenance and preservation of muscle mass.  She has agreed to increase walking.   FOLLOW UP: Return in about 2 weeks (around 09/11/2022). She was informed of the importance of frequent follow up visits to maximize her success with intensive lifestyle modifications for her multiple health conditions.  Subjective:   Chief complaint: Obesity Ryka is here to discuss her progress with her obesity treatment plan. She is on the the Category 1 Plan and states she is following her eating plan approximately 90% of the time. She states she is exercising 45 minutes 3 days per week through her silver sneakers plan.   Interval History:  BETTYJEAN SWILLING is here for a follow up office visit. Since the last visit she states that the meal plan has not been going well. She gets hungry at night and for dinner she eats 6oz of protein (fish, chicken, pork chops). Her goal is to be under 195lbs.   We reviewed her meal plan and all questions were answered. Patient's food recall appears to be accurate and consistent with what is on plan when she is following it. When eating on plan, her hunger and cravings are well controlled.      Review of Systems:  Pertinent positives were addressed with patient today.  Weight Summary and Biometrics   Weight Lost Since Last Visit: 1lb  No data recorded   Vitals Temp: 97.7 F (36.5 C) BP: 137/88 Pulse Rate: 71 SpO2: 96 %   Anthropometric Measurements Height: 5'  5" (1.651 m) Weight: 195 lb 3.2 oz (88.5 kg) BMI (Calculated): 32.48 Weight at Last Visit: 196lb Weight Lost Since Last Visit: 1lb Starting Weight: 228lb Total Weight Loss (lbs): 33 lb (15 kg) Peak Weight: 228lb   Body Composition  Body Fat %: 38 % Fat Mass (lbs): 74.2 lbs Muscle Mass (lbs): 114.8 lbs Total Body Water (lbs): 73.8 lbs Visceral Fat Rating : 10   Other Clinical Data Fasting: no Labs: no Today's Visit #: 22 Starting Date: 02/15/20    Objective:   PHYSICAL EXAM:  Blood pressure 137/88, pulse 71, temperature 97.7 F (36.5 C), height 5\' 5"  (1.651 m), weight 195 lb 3.2 oz (88.5 kg), SpO2 96 %. Body mass index is 32.48 kg/m.  General: Well Developed, well nourished, and in no acute distress.  HEENT: Normocephalic, atraumatic Skin: Warm and dry, cap RF less 2 sec, good turgor Chest:  Normal excursion, shape, no gross abn Respiratory: speaking in  full sentences, no conversational dyspnea NeuroM-Sk: Ambulates w/o assistance, moves * 4 Psych: A and O *3, insight good, mood-full  DIAGNOSTIC DATA REVIEWED:  BMET    Component Value Date/Time   NA 141 06/21/2022 1616   NA 138 04/22/2016 0750   K 4.2 06/21/2022 1616   K 3.9 04/22/2016 0750   CL 102 06/21/2022 1616   CO2 18 (L) 06/21/2022 1616   CO2 26 04/22/2016 0750   GLUCOSE 102 (H) 06/21/2022 1616   GLUCOSE 81 03/28/2022 1508   GLUCOSE 142 (H) 04/22/2016 0750   BUN 20 06/21/2022 1616   BUN 19.1 11/20/2016 1355   CREATININE 1.22 (H) 06/21/2022 1616   CREATININE 1.26 (H) 12/29/2019 1015   CREATININE 1.1 11/20/2016 1355   CALCIUM 12.2 (H) 06/21/2022 1616   CALCIUM 9.8 04/22/2016 0750   GFRNONAA 47 (L) 03/28/2022 1508   GFRNONAA 48 (L) 12/29/2019 1015   GFRNONAA 63 07/08/2014 1212   GFRAA 69 04/17/2020 0928   GFRAA 56 (L) 12/29/2019 1015   GFRAA 73 07/08/2014 1212   Lab Results  Component Value Date   HGBA1C 5.5 05/17/2022   HGBA1C 10.1 (H) 07/07/2015   No results found for: "INSULIN" Lab  Results  Component Value Date   TSH 1.370 06/21/2022   CBC    Component Value Date/Time   WBC 5.4 03/28/2022 1508   RBC 4.97 03/28/2022 1508   HGB 15.0 03/28/2022 1508   HGB 13.3 02/03/2020 0837   HGB 13.6 12/04/2018 0914   HGB 12.9 04/22/2016 0750   HCT 46.8 (H) 03/28/2022 1508   HCT 41.0 12/04/2018 0914   HCT 39.0 04/22/2016 0750   PLT 318 03/28/2022 1508   PLT 270 02/03/2020 0837   PLT 332 12/04/2018 0914   MCV 94.2 03/28/2022 1508   MCV 88 12/04/2018 0914   MCV 92.2 04/22/2016 0750   MCH 30.2 03/28/2022 1508   MCHC 32.1 03/28/2022 1508   RDW 13.6 03/28/2022 1508   RDW 13.6 12/04/2018 0914   RDW 13.9 04/22/2016 0750   Iron Studies    Component Value Date/Time   IRON 43 08/18/2010 0515   TIBC 389 08/18/2010 0515   FERRITIN 227 08/18/2010 0515   IRONPCTSAT 11 (L) 08/18/2010 0515   Lipid Panel     Component Value Date/Time   CHOL 167 01/09/2022 0855   TRIG 192 (H) 01/09/2022 0855   HDL 45 01/09/2022 0855   CHOLHDL 3.7 01/09/2022 0855   CHOLHDL 5.1 (H) 08/14/2016 1729   VLDL 74 (H) 08/14/2016 1729   LDLCALC 89 01/09/2022 0855   Hepatic Function Panel     Component Value Date/Time   PROT 7.4 01/09/2022 0855   PROT 8.2 04/22/2016 0750   ALBUMIN 4.6 01/09/2022 0855   ALBUMIN 3.8 04/22/2016 0750   AST 26 01/09/2022 0855   AST 29 12/29/2019 1015   AST 27 04/22/2016 0750   ALT 29 01/09/2022 0855   ALT 33 12/29/2019 1015   ALT 24 04/22/2016 0750   ALKPHOS 74 01/09/2022 0855   ALKPHOS 74 04/22/2016 0750   BILITOT 0.5 01/09/2022 0855   BILITOT 0.6 12/29/2019 1015   BILITOT 0.44 04/22/2016 0750   BILIDIR 0.2 08/18/2010 0515   IBILI 1.5 (H) 08/18/2010 0515      Component Value Date/Time   TSH 1.370 06/21/2022 1616   Nutritional Lab Results  Component Value Date   VD25OH 31.2 06/21/2022   VD25OH 35.7 10/22/2021   VD25OH 99.6 06/26/2021    Attestations:   Reviewed by clinician  on day of visit: allergies, medications, problem list, medical history,  surgical history, family history, social history, and previous encounter notes.    I,Special Puri,acting as a Education administrator for [employer], DO.,have documented all relevant documentation on the behalf of Mellody Dance, DO,as directed by  Mellody Dance, DO while in the presence of Mellody Dance, DO.   I, Mellody Dance, DO, have reviewed all documentation for this visit. The documentation on 08/28/22 for the exam, diagnosis, procedures, and orders are all accurate and complete.

## 2022-08-30 ENCOUNTER — Other Ambulatory Visit: Payer: Self-pay | Admitting: Internal Medicine

## 2022-08-30 DIAGNOSIS — N3946 Mixed incontinence: Secondary | ICD-10-CM

## 2022-08-30 NOTE — Telephone Encounter (Signed)
Requested Prescriptions  Pending Prescriptions Disp Refills   solifenacin (VESICARE) 5 MG tablet [Pharmacy Med Name: SOLIFENACIN SUCCINATE 5 MG Tablet] 90 tablet 0    Sig: TAKE 1 TABLET EVERY DAY     Urology:  Bladder Agents 2 Failed - 08/30/2022  2:17 AM      Failed - Cr in normal range and within 360 days    Creatinine  Date Value Ref Range Status  12/29/2019 1.26 (H) 0.44 - 1.00 mg/dL Final  11/20/2016 1.1 0.6 - 1.1 mg/dL Final   Creatinine, Ser  Date Value Ref Range Status  06/21/2022 1.22 (H) 0.57 - 1.00 mg/dL Final   Creatinine, POC  Date Value Ref Range Status  11/13/2016 100 mg/dL Final   Creatinine, Urine  Date Value Ref Range Status  07/27/2015 174 20 - 320 mg/dL Final         Passed - ALT in normal range and within 360 days    ALT  Date Value Ref Range Status  01/09/2022 29 0 - 32 IU/L Final  12/29/2019 33 0 - 44 U/L Final  04/22/2016 24 0 - 55 U/L Final         Passed - AST in normal range and within 360 days    AST  Date Value Ref Range Status  01/09/2022 26 0 - 40 IU/L Final  12/29/2019 29 15 - 41 U/L Final  04/22/2016 27 5 - 34 U/L Final         Passed - Valid encounter within last 12 months    Recent Outpatient Visits           1 month ago Chronic bilateral thoracic back pain   [place], MD   3 months ago Type 2 diabetes mellitus with obesity [facility])   [place] Karle Plumber B, MD   7 months ago Type 2 diabetes mellitus with obesity [facility])   [place] Karle Plumber B, MD   11 months ago Type 2 diabetes mellitus with obesity [facility])   [place] Karle Plumber B, MD   1 year ago Type 2 diabetes mellitus with morbid obesity [facility])   [place], [place]       Future Appointments             In 2 weeks Ladell Pier, MD  [facility]

## 2022-09-11 ENCOUNTER — Other Ambulatory Visit (INDEPENDENT_AMBULATORY_CARE_PROVIDER_SITE_OTHER): Payer: Self-pay | Admitting: Family Medicine

## 2022-09-11 ENCOUNTER — Other Ambulatory Visit (INDEPENDENT_AMBULATORY_CARE_PROVIDER_SITE_OTHER): Payer: Self-pay | Admitting: Physician Assistant

## 2022-09-11 ENCOUNTER — Ambulatory Visit (INDEPENDENT_AMBULATORY_CARE_PROVIDER_SITE_OTHER): Payer: Medicare PPO | Admitting: Family Medicine

## 2022-09-11 ENCOUNTER — Encounter (INDEPENDENT_AMBULATORY_CARE_PROVIDER_SITE_OTHER): Payer: Self-pay | Admitting: Family Medicine

## 2022-09-11 VITALS — BP 122/81 | HR 62 | Temp 97.7°F | Ht 65.0 in | Wt 198.4 lb

## 2022-09-11 DIAGNOSIS — Z9109 Other allergy status, other than to drugs and biological substances: Secondary | ICD-10-CM | POA: Diagnosis not present

## 2022-09-11 DIAGNOSIS — E1169 Type 2 diabetes mellitus with other specified complication: Secondary | ICD-10-CM

## 2022-09-11 DIAGNOSIS — E669 Obesity, unspecified: Secondary | ICD-10-CM

## 2022-09-11 DIAGNOSIS — G4709 Other insomnia: Secondary | ICD-10-CM

## 2022-09-11 DIAGNOSIS — Z6832 Body mass index (BMI) 32.0-32.9, adult: Secondary | ICD-10-CM | POA: Diagnosis not present

## 2022-09-11 DIAGNOSIS — Z7985 Long-term (current) use of injectable non-insulin antidiabetic drugs: Secondary | ICD-10-CM | POA: Diagnosis not present

## 2022-09-11 DIAGNOSIS — K219 Gastro-esophageal reflux disease without esophagitis: Secondary | ICD-10-CM

## 2022-09-11 DIAGNOSIS — F39 Unspecified mood [affective] disorder: Secondary | ICD-10-CM | POA: Diagnosis not present

## 2022-09-11 MED ORDER — TIRZEPATIDE 12.5 MG/0.5ML ~~LOC~~ SOAJ: 12.5000 mg | SUBCUTANEOUS | 0 refills | Status: DC

## 2022-09-11 MED ORDER — BUPROPION HCL ER (SR) 150 MG PO TB12: 150.0000 mg | ORAL_TABLET | Freq: Two times a day (BID) | ORAL | 0 refills | Status: DC

## 2022-09-11 NOTE — Progress Notes (Signed)
Sarah [name], D.O.  ABFM, ABOM Specializing in Clinical Bariatric Medicine  Office located at: [address], [place]  [address]     Assessment and Plan:   No orders of the defined types were placed in this encounter.   Medications Discontinued During This Encounter  Medication Reason   tirzepatide Sarah [name]) 15 MG/0.5ML Pen [employer] Drug Shortage   buPROPion (WELLBUTRIN SR) 150 MG 12 hr tablet Reorder     Meds ordered this encounter  Medications   buPROPion (WELLBUTRIN SR) 150 MG 12 hr tablet    Sig: Take 1 tablet (150 mg total) by mouth 2 (two) times daily.    Dispense:  60 tablet    Refill:  0   tirzepatide (MOUNJARO) 12.5 MG/0.5ML Pen    Sig: Inject 12.5 mg into the skin once a week.    Dispense:  2 mL    Refill:  0     Type 2 diabetes mellitus with obesity Assessment: Condition is stable. Lab Results  Component Value Date   HGBA1C 5.5 05/17/2022   HGBA1C 5.5 01/09/2022   HGBA1C 5.7 10/04/2021  Patient has been having a hard time eating all the food on her meal plan. She is still taking the Mounjaro 12.5 mg weekly. She has not been able to obtain Mounjaro 15 mg weekly due to a drug shortage. Denies any side effects of Mounjaro when eating on plan. Patient has a glucose monitor on arm.Her average sugars in the last 7 days have been in the 90s. When she is sleeping, she endorses her sugars drop low, but she does not know the exact value.  Plan:  Discontinue Mounjaro 15 mg weekly and Continue with Mounjaro 12.5 mg weekly. Will refill Mounjaro 12.5 mg weekly today.   - Continue checking sugars at home.  - Continue her prudent nutritional plan and continue to advance exercise and cardiovascular fitness as tolerated.  - We will recheck A1c and fasting insulin level in approximately 3 months from last check, or as deemed appropriate.     Depressed mood (HCC) with emotional eating Assessment: Condition is stable. Mood is stable. No SI/HI. Patient is  compliant with Wellbutrin SR 150 mg BID. She endorses she does not do a lot of emotional eating. She colors, plays games, and listens to rain for stress management.   Plan: Will refill Wellbutrin today.  Continue with med as recommended by pcp/specialist.  Continue her prudent nutritional plan and continue to advance exercise and cardiovascular fitness as tolerated.    Environmental Allergies Assessment: Condition is stable. Patient reports good compliance and tolerance with Singulair 10 mg daily and Xyzal 5 mg daily. Denies any side effects.  Plan: Continue with meds     TREATMENT PLAN FOR OBESITY: BMI 32.0-32.9,adult-current bmi 33.0 Obesity with starting BMI 37.9/date 02/15/2020 Assessment: Condition is Not optimized.. Biometric data collected today, was reviewed with patient.  Fat mass has increased by 4.8lb. Muscle mass has decreased by 1.4lb. Total body water has increased by 1.6lb.   Plan: Continue with  the Category 1 meal plan and begin journaling.  I recommended patient to do journaling to increase awareness of the food she is consuming.   Behavioral Intervention Additional resources provided today: Food journaling plan information, hidden calories handout.  Evidence-based interventions for health behavior change were utilized today including the discussion of self monitoring techniques, problem-solving barriers and SMART goal setting techniques.   Regarding patient's less desirable eating habits and patterns, we employed the technique  of small changes.  Pt will specifically work on: beginning to journaling and increasing exercise to 4 days weeklyfor next visit.    Recommended Physical Activity Goals She has agreed to Think about ways to increase physical activity  FOLLOW UP: Return in about 2 weeks (around 09/25/2022).  She was informed of the importance of frequent follow up visits to maximize her success with intensive lifestyle modifications for her multiple health  conditions.    Subjective:   Chief complaint: Obesity Sarah [name] is here to discuss her progress with her obesity treatment plan. She is on the the Category 1 Plan and states she is following her eating plan approximately 90% of the time. She states she is exercising 50 minutes 3 days per week.  Interval History:  Sarah [name] is here for a follow up office visit. Since last office visit, she endores has been snacking more and it has been difficult for her to eat all the food on the meal meal. She statesthat during dinner she is not eating enough protein.  We reviewed her meal plan and all questions were answered. Patient's food recall appears to be accurate and consistent with what is on plan when she is following it. When eating on plan, her hunger and cravings are well controlled.     Pharmacotherapy for weight loss: She is currently taking  Mounjaro  for medical weight loss.  Denies side effects.    Review of Systems:  Pertinent positives were addressed with patient today.   Weight Summary and Biometrics   No data recorded Weight Gained Since Last Visit: 3lb   Vitals Temp: 97.7 F (36.5 C) BP: 122/81 Pulse Rate: 62 SpO2: 94 %   Anthropometric Measurements Height: 5\' 5"  (1.651 m) Weight: 198 lb 6.4 oz (90 kg) BMI (Calculated): 33.02 Weight at Last Visit: 195lb Weight Gained Since Last Visit: 3lb Starting Weight: 228lb Total Weight Loss (lbs): 30 lb (13.6 kg) Peak Weight: 228lb   Body Composition  Body Fat %: 39.8 % Fat Mass (lbs): 79 lbs Muscle Mass (lbs): 113.4 lbs Total Body Water (lbs): 75.4 lbs Visceral Fat Rating : 10   Other Clinical Data Fasting: yes Labs: no Today's Visit #: 59 Starting Date: 02/15/20     Objective:   PHYSICAL EXAM:  Blood pressure 122/81, pulse 62, temperature 97.7 F (36.5 C), height 5\' 5"  (1.651 m), weight 198 lb 6.4 oz (90 kg), SpO2 94 %. Body mass index is 33.02 kg/m.  General: Well Developed, well nourished, and  in no acute distress.  HEENT: Normocephalic, atraumatic Skin: Warm and dry, cap RF less 2 sec, good turgor Chest:  Normal excursion, shape, no gross abn Respiratory: speaking in full sentences, no conversational dyspnea NeuroM-Sk: Ambulates w/o assistance, moves * 4 Psych: A and O *3, insight good, mood-full  DIAGNOSTIC DATA REVIEWED:  BMET    Component Value Date/Time   NA 141 06/21/2022 1616   NA 138 04/22/2016 0750   K 4.2 06/21/2022 1616   K 3.9 04/22/2016 0750   CL 102 06/21/2022 1616   CO2 18 (L) 06/21/2022 1616   CO2 26 04/22/2016 0750   GLUCOSE 102 (H) 06/21/2022 1616   GLUCOSE 81 03/28/2022 1508   GLUCOSE 142 (H) 04/22/2016 0750   BUN 20 06/21/2022 1616   BUN 19.1 11/20/2016 1355   CREATININE 1.22 (H) 06/21/2022 1616   CREATININE 1.26 (H) 12/29/2019 1015   CREATININE 1.1 11/20/2016 1355   CALCIUM 12.2 (H) 06/21/2022 1616   CALCIUM 9.8 04/22/2016 0750  GFRNONAA 47 (L) 03/28/2022 1508   GFRNONAA 48 (L) 12/29/2019 1015   GFRNONAA 63 07/08/2014 1212   GFRAA 69 04/17/2020 0928   GFRAA 56 (L) 12/29/2019 1015   GFRAA 73 07/08/2014 1212   Lab Results  Component Value Date   HGBA1C 5.5 05/17/2022   HGBA1C 10.1 (H) 07/07/2015   No results found for: "INSULIN" Lab Results  Component Value Date   TSH 1.370 06/21/2022   CBC    Component Value Date/Time   WBC 5.4 03/28/2022 1508   RBC 4.97 03/28/2022 1508   HGB 15.0 03/28/2022 1508   HGB 13.3 02/03/2020 0837   HGB 13.6 12/04/2018 0914   HGB 12.9 04/22/2016 0750   HCT 46.8 (H) 03/28/2022 1508   HCT 41.0 12/04/2018 0914   HCT 39.0 04/22/2016 0750   PLT 318 03/28/2022 1508   PLT 270 02/03/2020 0837   PLT 332 12/04/2018 0914   MCV 94.2 03/28/2022 1508   MCV 88 12/04/2018 0914   MCV 92.2 04/22/2016 0750   MCH 30.2 03/28/2022 1508   MCHC 32.1 03/28/2022 1508   RDW 13.6 03/28/2022 1508   RDW 13.6 12/04/2018 0914   RDW 13.9 04/22/2016 0750   Iron Studies    Component Value Date/Time   IRON 43 08/18/2010  0515   TIBC 389 08/18/2010 0515   FERRITIN 227 08/18/2010 0515   IRONPCTSAT 11 (L) 08/18/2010 0515   Lipid Panel     Component Value Date/Time   CHOL 167 01/09/2022 0855   TRIG 192 (H) 01/09/2022 0855   HDL 45 01/09/2022 0855   CHOLHDL 3.7 01/09/2022 0855   CHOLHDL 5.1 (H) 08/14/2016 1729   VLDL 74 (H) 08/14/2016 1729   LDLCALC 89 01/09/2022 0855   Hepatic Function Panel     Component Value Date/Time   PROT 7.4 01/09/2022 0855   PROT 8.2 04/22/2016 0750   ALBUMIN 4.6 01/09/2022 0855   ALBUMIN 3.8 04/22/2016 0750   AST 26 01/09/2022 0855   AST 29 12/29/2019 1015   AST 27 04/22/2016 0750   ALT 29 01/09/2022 0855   ALT 33 12/29/2019 1015   ALT 24 04/22/2016 0750   ALKPHOS 74 01/09/2022 0855   ALKPHOS 74 04/22/2016 0750   BILITOT 0.5 01/09/2022 0855   BILITOT 0.6 12/29/2019 1015   BILITOT 0.44 04/22/2016 0750   BILIDIR 0.2 08/18/2010 0515   IBILI 1.5 (H) 08/18/2010 0515      Component Value Date/Time   TSH 1.370 06/21/2022 1616   Nutritional Lab Results  Component Value Date   VD25OH 31.2 06/21/2022   VD25OH 35.7 10/22/2021   VD25OH 99.6 06/26/2021    Attestations:   Reviewed by clinician on day of visit: allergies, medications, problem list, medical history, surgical history, family history, social history, and previous encounter notes.  I,Special Puri,acting as a Neurosurgeon for [employer], DO.,have documented all relevant documentation on the behalf of Thomasene Lot, DO,as directed by  Thomasene Lot, DO while in the presence of Thomasene Lot, DO.   I, Thomasene Lot, DO, have reviewed all documentation for this visit. The documentation on 09/11/22 for the exam, diagnosis, procedures, and orders are all accurate and complete.

## 2022-09-12 ENCOUNTER — Encounter (INDEPENDENT_AMBULATORY_CARE_PROVIDER_SITE_OTHER): Payer: Self-pay | Admitting: Family Medicine

## 2022-09-17 ENCOUNTER — Ambulatory Visit: Payer: Medicare PPO | Attending: Internal Medicine | Admitting: Internal Medicine

## 2022-09-17 ENCOUNTER — Encounter: Payer: Self-pay | Admitting: Internal Medicine

## 2022-09-17 VITALS — BP 130/78 | HR 61 | Temp 98.3°F | Ht 65.0 in | Wt 206.0 lb

## 2022-09-17 DIAGNOSIS — E785 Hyperlipidemia, unspecified: Secondary | ICD-10-CM | POA: Diagnosis not present

## 2022-09-17 DIAGNOSIS — M545 Low back pain, unspecified: Secondary | ICD-10-CM

## 2022-09-17 DIAGNOSIS — E1169 Type 2 diabetes mellitus with other specified complication: Secondary | ICD-10-CM | POA: Diagnosis not present

## 2022-09-17 DIAGNOSIS — G8929 Other chronic pain: Secondary | ICD-10-CM | POA: Diagnosis not present

## 2022-09-17 DIAGNOSIS — L309 Dermatitis, unspecified: Secondary | ICD-10-CM

## 2022-09-17 DIAGNOSIS — I700 Atherosclerosis of aorta: Secondary | ICD-10-CM | POA: Diagnosis not present

## 2022-09-17 DIAGNOSIS — M546 Pain in thoracic spine: Secondary | ICD-10-CM

## 2022-09-17 DIAGNOSIS — N1831 Chronic kidney disease, stage 3a: Secondary | ICD-10-CM

## 2022-09-17 DIAGNOSIS — G620 Drug-induced polyneuropathy: Secondary | ICD-10-CM | POA: Diagnosis not present

## 2022-09-17 DIAGNOSIS — I152 Hypertension secondary to endocrine disorders: Secondary | ICD-10-CM | POA: Diagnosis not present

## 2022-09-17 DIAGNOSIS — E669 Obesity, unspecified: Secondary | ICD-10-CM

## 2022-09-17 DIAGNOSIS — T451X5A Adverse effect of antineoplastic and immunosuppressive drugs, initial encounter: Secondary | ICD-10-CM

## 2022-09-17 DIAGNOSIS — E1159 Type 2 diabetes mellitus with other circulatory complications: Secondary | ICD-10-CM

## 2022-09-17 LAB — POCT GLYCOSYLATED HEMOGLOBIN (HGB A1C): HbA1c, POC (controlled diabetic range): 6 % (ref 0.0–7.0)

## 2022-09-17 LAB — GLUCOSE, POCT (MANUAL RESULT ENTRY): POC Glucose: 135 mg/dl — AB (ref 70–99)

## 2022-09-17 MED ORDER — TRIAMCINOLONE ACETONIDE 0.1 % EX CREA
1.0000 | TOPICAL_CREAM | Freq: Two times a day (BID) | CUTANEOUS | 0 refills | Status: DC
Start: 2022-09-17 — End: 2024-01-22

## 2022-09-17 NOTE — Progress Notes (Signed)
Patient ID: Sarah [name], female    DOB: March 31, 1966  MRN: [id number]  CC: Diabetes (DM f/u. /Dark spot on abdomen first noticed 3-4 mo ago. /)   Subjective: Sarah [name] is a 57 y.o. 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 (S/p adjuvant chemotherapy completed on 12/08/14. S/p ex lap, hernia repair and small bowel resection for a presumed recurrence (benign pathology), Hypercalcemia with elevated free light chains (BM bx Dr. Mosetta Putt negative, no need for further f/u), dx with FHH by Dr. Everardo All, 1.1 cm RLL nodule (bx negative 01/2020), RT thyroid nodule (does not met criteria for bx 10/2019),   Chronic thoracic and lumbar back pain: Started on tramadol on last visit.  Continued with gabapentin. Doing good. She has not had to use Tramadol as yet.  Robaxin seems to do the trick.  Uses it 2-3 times a wk depending on what she does.  On gabapentin more so for peripheral neuropathy associated with diabetes and previous chemotherapy.  DM/Obesity:  Lab Results  Component Value Date   HGBA1C 6.0 09/17/2022  Still followed by medical weight management.  Lantus insulin has been discontinued completely.  On Mounjaro 12 mg once a week.  Tolerating it ok.   Has loss 30 lbs since starting the program.  Goes to gym 2-3 days a wk  HL/aortic atherosclerosis: Compliant with Crestor 20 mg daily.  HTN: Reports compliance with taking amlodipine 10 mg daily, hydralazine 25 mg 1-1/2 tablets 3 times a day, Cozaar 50 mg daily, metoprolol 25 mg half tablet twice a day, no chest pains or shortness of breath.  CKD 3a/b: GFR over the past year has ranged from 43-52 with most recent reading of 52.  She is not on any NSAIDs.   Hypercalcemia: Saw endocrinologist Dr. Elvera Lennox 06/2022.  Question of whether  pt has FHH as this is usually a life long condition and hyperCa+ just started a few yrs ago.  She did nuclear medicine bone density and this did not reveal any suspicious lesions.  Plan is to observe for now.  Complains of having a dark rash on the mid abdomen x 3 months.  No initiating factors.  Sometimes itches. Patient Active Problem List   Diagnosis Date Noted   Class 1 obesity with serious comorbidity and body mass index (BMI) of 32.0 to 32.9 in adult 08/28/2022   Gastroesophageal reflux disease 08/28/2022   with current bmi 32.5 08/28/2022   Opioid use agreement exists 07/21/2022   Degenerative disc disease, thoracic 07/21/2022   Degenerative disc disease, lumbar 07/21/2022   Essential hypertension 07/10/2022   BMI 32.0-32.9,adult-current bmi 32.9 07/10/2022   Multiple thyroid nodules 06/21/2022   Type 2 diabetes mellitus with obesity 05/28/2022   Environmental allergies 05/28/2022   Class 2 severe obesity with serious comorbidity and body mass index (BMI) of 37.0 to 37.9 in adult 05/28/2022   H/O gastroesophageal reflux (GERD) 05/28/2022   Hypomagnesemia 02/04/2022   SOB (shortness of breath) on exertion 02/04/2022   Mixed stress and urge urinary incontinence 10/04/2021   S/P hernia repair 07/03/2021   Atherosclerosis of aorta 05/28/2021   Mood disorder (HCC) with emotional eating 05/21/2021   Cutaneous abscess of back excluding buttocks 01/12/2021   Depressed mood, with emotional eating 10/23/2020   Seasonal allergies 09/05/2020  Class 2 severe obesity with serious comorbidity and body mass index (BMI) of 36.0 to 36.9 in adult 07/24/2020   At risk for impaired metabolic function 06/12/2020   Hypertension associated with type 2 diabetes mellitus 05/15/2020   Stage 3 chronic kidney disease 05/01/2020   Type 2 diabetes mellitus with chronic kidney disease, with long-term current use of insulin 04/17/2020   At risk for hypoglycemia 04/17/2020   B12 deficiency 02/29/2020    Diabetes mellitus 02/15/2020   Vitamin D deficiency 02/15/2020   Other insomnia 01/24/2020   Morbid obesity 01/24/2020   Elevated serum immunoglobulin free light chains 01/21/2020   Thyroid nodule 10/14/2019   Renal insufficiency 06/24/2019   Hypercalcemia 06/24/2019   Hiatal hernia with GERD without esophagitis 06/18/2017   Need for influenza vaccination 02/17/2017   Ventral hernia 01/24/2016   Type 2 diabetes mellitus without complication, without long-term current use of insulin 10/04/2015   Generalized obesity 10/04/2015   Hyperlipidemia associated with type 2 diabetes mellitus 07/27/2015   Neuropathic pain of both legs 07/27/2015   Obesity (BMI 30.0-34.9) 12/24/2014   Genetic testing 11/01/2014   Constipation - functional 09/24/2014   Chemotherapy-induced peripheral neuropathy 09/24/2014   Midline low back pain without sciatica 09/24/2014   Family history of colon cancer    Hilar adenopathy    Hypertension associated with diabetes (HCC)    Right lower lobe pulmonary nodule      Current Outpatient Medications on File Prior to Visit  Medication Sig Dispense Refill   ACCU-CHEK AVIVA PLUS test strip TEST BLOOD SUGAR AS DIRECTED 300 strip 3   Accu-Chek Softclix Lancets lancets TEST BLOOD SUGAR THREE TIMES DAILY 300 each 3   albuterol (VENTOLIN HFA) 108 (90 Base) MCG/ACT inhaler Inhale 2 puffs into the lungs every 6 (six) hours as needed for wheezing or shortness of breath. 8 g 0   amLODipine (NORVASC) 10 MG tablet TAKE 1 TABLET EVERY DAY 90 tablet 1   aspirin-sod bicarb-citric acid (ALKA-SELTZER) 325 MG TBEF tablet Take 650 mg by mouth every 6 (six) hours as needed (indigestion).     Blood Glucose Monitoring Suppl (ACCU-CHEK AVIVA PLUS) w/Device KIT USE AS DIRECTED 1 kit 0   buPROPion (WELLBUTRIN SR) 150 MG 12 hr tablet Take 1 tablet (150 mg total) by mouth 2 (two) times daily. 60 tablet 0   dorzolamide-timolol (COSOPT) 2-0.5 % ophthalmic solution Place 1 drop into both eyes 2  (two) times daily. 10 mL 3   dorzolamide-timolol (COSOPT) 22.3-6.8 MG/ML ophthalmic solution Place 1 drop into both eyes 2 (two) times daily.     DROPLET PEN NEEDLES 29G X MISC USE AS DIRECTED 100 each 3   gabapentin (NEURONTIN) 600 MG tablet Take 1 tablet (600 mg total) by mouth 3 (three) times daily. 270 tablet 2   gatifloxacin (ZYMAXID) 0.5 % SOLN Place 1 drop into the right eye 4 (four) times daily as directed 5 mL 1   hydrALAZINE (APRESOLINE) 25 MG tablet TAKE 1 AND 1/2 TABLETS THREE TIMES DAILY 405 tablet 0   ketorolac (ACULAR) 0.5 % ophthalmic solution Place 1 drop into the right eye 4 (four) times daily as directed 10 mL 1   Lancets Misc. (ACCU-CHEK SOFTCLIX LANCET DEV) KIT Check blood sugars 3 times a day. 3 kit 6   latanoprost (XALATAN) 0.005 % ophthalmic solution Place 1 drop into both eyes at bedtime.     latanoprost (XALATAN) 0.005 % ophthalmic solution Place 1 drop into both eyes every evening at bedtime as directed  2.5 mL 6   levocetirizine (XYZAL) 5 MG tablet Take 1 tablet (5 mg total) by mouth every evening. 30 tablet 0   lidocaine (LIDODERM) 5 % PLACE 1 PATCH ONTO THE SKIN DAILY. REMOVE AND DISCARD PATCH WITHIN 12 HOURS OR AS DIRECTED BY MD 30 patch 1   losartan (COZAAR) 50 MG tablet TAKE 1 TABLET EVERY DAY 90 tablet 3   metFORMIN (GLUCOPHAGE) 500 MG tablet Take 1 tablet (500 mg total) by mouth daily with breakfast. 90 tablet 1   methocarbamol (ROBAXIN) 500 MG tablet Take 1 tablet (500 mg total) by mouth 2 (two) times daily as needed for muscle spasms. 40 tablet 3   metoprolol tartrate (LOPRESSOR) 25 MG tablet TAKE 1/2 TABLET TWICE DAILY 90 tablet 1   montelukast (SINGULAIR) 10 MG tablet Take 1 tablet (10 mg total) by mouth at bedtime. 30 tablet 0   omeprazole (PRILOSEC) 20 MG capsule Take 1 capsule (20 mg total) by mouth daily. 30 capsule 0   prednisoLONE acetate (PRED FORTE) 1 % ophthalmic suspension Place 1 drop into the right eye 4 (four) times daily as directed 5 mL 1    rosuvastatin (CRESTOR) 20 MG tablet TAKE 1 TABLET EVERY DAY 90 tablet 0   solifenacin (VESICARE) 5 MG tablet TAKE 1 TABLET EVERY DAY 90 tablet 0   tirzepatide (MOUNJARO) 12.5 MG/0.5ML Pen Inject 12.5 mg into the skin once a week. 2 mL 0   traMADol (ULTRAM) 50 MG tablet Take 1 tablet (50 mg total) by mouth every 12 (twelve) hours as needed. 60 tablet 0   traZODone (DESYREL) 100 MG tablet Take 1 tablet (100 mg total) by mouth at bedtime as needed for sleep. 30 tablet 0   vitamin B-12 (CYANOCOBALAMIN) 500 MCG tablet Take 1 tablet (500 mcg total) by mouth daily.     No current facility-administered medications on file prior to visit.    Allergies  Allergen Reactions   Emend [Aprepitant] Other (See Comments)    Urticaria    Lisinopril Cough    Social History   Socioeconomic History   Marital status: Married    Spouse name: Not on file   Number of children: 2   Years of education: Not on file   Highest education level: 12th grade  Occupational History   Occupation: disability  Tobacco Use   Smoking status: Never   Smokeless tobacco: Never  Vaping Use   Vaping Use: Never used  Substance and Sexual Activity   Alcohol use: No   Drug use: No   Sexual activity: Not Currently    Birth control/protection: None  Other Topics Concern   Not on file  Social History Narrative   No issues with transportation.    Attends church   Social Determinants of Health   Financial Resource Strain: Low Risk  (09/16/2022)   Overall Financial Resource Strain (CARDIA)    Difficulty of Paying Living Expenses: Not very hard  Food Insecurity: No Food Insecurity (09/16/2022)   Hunger Vital Sign    Worried About Running Out of Food in the Last Year: Never true    Ran Out of Food in the Last Year: Never true  Transportation Needs: No Transportation Needs (09/16/2022)   PRAPARE - Administrator, Civil Service (Medical): No    Lack of Transportation (Non-Medical): No  Physical Activity:  Sufficiently Active (09/16/2022)   Exercise Vital Sign    Days of Exercise per Week: 3 days    Minutes of Exercise per Session:  50 min  Stress: No Stress Concern Present (09/16/2022)   [employer] of Occupational Health - Occupational Stress Questionnaire    Feeling of Stress : Only a little  Social Connections: Moderately Integrated (09/16/2022)   Social Connection and Isolation Panel [NHANES]    Frequency of Communication with Friends and Family: More than three times a week    Frequency of Social Gatherings with Friends and Family: Twice a week    Attends Religious Services: More than 4 times per year    Active Member of Clubs or Organizations: Yes    Attends Banker Meetings: More than 4 times per year    Marital Status: Separated  Recent Concern: Social Connections - Moderately Isolated (06/28/2022)   Social Connection and Isolation Panel [NHANES]    Frequency of Communication with Friends and Family: More than three times a week    Frequency of Social Gatherings with Friends and Family: More than three times a week    Attends Religious Services: More than 4 times per year    Active Member of [employer] or Organizations: No    Attends Banker Meetings: Never    Marital Status: Separated  Intimate Partner Violence: Not At Risk (06/28/2022)   Humiliation, Afraid, Rape, and Kick questionnaire    Fear of Current or Ex-Partner: No    Emotionally Abused: No    Physically Abused: No    Sexually Abused: No    Family History  Problem Relation Age of Onset   Hypertension Mother    Hyperlipidemia Mother    Stroke Mother    Thyroid disease Mother    Arthritis Mother    Hearing loss Mother    Hypertension Father    Diabetes Father    Hyperlipidemia Father    Sudden death Father    Arthritis Father    Heart disease Father    Obesity Father    Cancer Sister 18       fibrosarcoma (back); currently 69   Cancer Brother    Prostate cancer Maternal Uncle    Colon  cancer Paternal Aunt        Dx 86s; deceased 24s   Prostate cancer Paternal Uncle        currently 74   Cancer Paternal Uncle 67       "bone" ; unk. primary   Stomach cancer Paternal Uncle    Hypercalcemia Neg Hx     Past Surgical History:  Procedure Laterality Date   ABDOMINAL HYSTERECTOMY     FOOT SURGERY  04/2018   [facility]   HERNIA REPAIR     INCISIONAL HERNIA REPAIR N/A 07/06/2015   Procedure: INCISIONAL HERNIA REPAIR ;  Surgeon: Emelia Loron, MD;  Location: [facility];  Service: General;  Laterality: N/A;   INCISIONAL HERNIA REPAIR N/A 07/03/2021   Procedure: Sherald Hess HERNIA REPAIR WITH MESH;  Surgeon: Axel Filler, MD;  [facility];  Service: General;  Laterality: N/A;   INSERTION OF MESH N/A 07/06/2015   Procedure: WITH INSERTION OF PHASIX ST MESH;  Surgeon: Emelia Loron, MD;  Location: [facility];  Service: General;  Laterality: N/A;   LAPAROTOMY N/A 07/06/2015   Procedure: EXPLORATORY LAPAROTOMY;  Surgeon: Adolphus Birchwood, MD;  Location: [facility];  Service: Gynecology;  Laterality: N/A;   LYSIS OF ADHESION N/A 07/06/2015   Procedure:  LYSIS OF ADHESION RESECTION OF MESENTERIC MASS BOWEL RESECTION ;  Surgeon: Adolphus Birchwood, MD;  Location: [facility];  Service: Gynecology;  Laterality: N/A;  LYSIS OF ADHESION N/A 07/03/2021   Procedure: EXPLORATORY LAPAROTOMY WITH LYSIS OF ADHESION;  Surgeon: Axel Filler, MD;  Location: [facility] OR;  Service: General;  Laterality: N/A;   ROBOTIC ASSISTED TOTAL HYSTERECTOMY WITH BILATERAL SALPINGO OOPHERECTOMY Bilateral 07/28/2014   Procedure: ROBOTIC ASSISTED lysis of adhesions with biopsies, converted to LAPAROTOMY, bilateral salpingoorphorectomy, omentectomy,appendectomy;  Surgeon: Laurette Schimke, MD;  Location: [facility];  Service: Gynecology;  Laterality: Bilateral;   THYROIDECTOMY, PARTIAL     VIDEO BRONCHOSCOPY N/A 01/13/2020   Procedure: VIDEO BRONCHOSCOPY WITH BIOPSIES;  Surgeon: Loreli Slot, MD;  Location: [facility] OR;  Service:  Thoracic;  Laterality: N/A;    ROS: Review of Systems Negative except as stated above  PHYSICAL EXAM: BP 128/83 (BP Location: Left Arm, Patient Position: Sitting, Cuff Size: Large)   Pulse 61   Temp 98.3 F (36.8 C) (Oral)   Ht  (1.651 m)   Wt 206 lb (93.4 kg)   LMP  (LMP Unknown)   SpO2 100%   BMI 34.28 kg/m   Wt Readings from Last 3 Encounters:  09/17/22 206 lb (93.4 kg)  09/11/22 198 lb 6.4 oz (90 kg)  08/28/22 195 lb 3.2 oz (88.5 kg)    Physical Exam  General appearance - alert, well appearing, and in no distress Mental status - normal mood, behavior, speech, dress, motor activity, and thought processes Chest - clear to auscultation, no wheezes, rales or rhonchi, symmetric air entry Heart - normal rate, regular rhythm, normal S1, S2, no murmurs, rubs, clicks or gallops Extremities - peripheral pulses normal, no pedal edema, no clubbing or cyanosis Skin -11 x 7 cm hyperpigmented rough slightly raised rash on the mid abdomen.  See picture below.       Latest Ref Rng & Units 06/21/2022    4:16 PM 03/28/2022    3:08 PM 01/09/2022    8:55 AM  CMP  Glucose 70 - 99 mg/dL 161  81  80   BUN 6 - 24 mg/dL Creatinine 0.57 - 1.00 mg/dL 0.96  0.45  4.09   Sodium 134 - 144 mmol/L 141  139  142   Potassium 3.5 - 5.2 mmol/L 4.2  3.7  4.2   Chloride 96 - 106 mmol/L 102  106  103   CO2 20 - 29 mmol/L Calcium 8.7 - 10.2 mg/dL 81.1  91.4  78.2   Total Protein 6.0 - 8.5 g/dL   7.4   Total Bilirubin 0.0 - 1.2 mg/dL   0.5   Alkaline Phos 44 - 121 IU/L   74   AST 0 - 40 IU/L   26   ALT 0 - 32 IU/L   29    Lipid Panel     Component Value Date/Time   CHOL 167 01/09/2022 0855   TRIG 192 (H) 01/09/2022 0855   HDL 45 01/09/2022 0855   CHOLHDL 3.7 01/09/2022 0855   CHOLHDL 5.1 (H) 08/14/2016 1729   VLDL 74 (H) 08/14/2016 1729   LDLCALC 89 01/09/2022 0855    CBC    Component Value Date/Time   WBC 5.4 03/28/2022 1508   RBC 4.97 03/28/2022 1508    HGB 15.0 03/28/2022 1508   HGB 13.3 02/03/2020 0837   HGB 13.6 12/04/2018 0914   HGB 12.9 04/22/2016 0750   HCT 46.8 (H) 03/28/2022 1508   HCT 41.0 12/04/2018 0914   HCT 39.0 04/22/2016 0750   PLT 318 03/28/2022  1508   PLT 270 02/03/2020 0837   PLT 332 12/04/2018 0914   MCV 94.2 03/28/2022 1508   MCV 88 12/04/2018 0914   MCV 92.2 04/22/2016 0750   MCH 30.2 03/28/2022 1508   MCHC 32.1 03/28/2022 1508   RDW 13.6 03/28/2022 1508   RDW 13.6 12/04/2018 0914   RDW 13.9 04/22/2016 0750   LYMPHSABS 2.4 03/28/2022 1508   LYMPHSABS 2.7 04/22/2016 0750   MONOABS 0.8 03/28/2022 1508   MONOABS 1.0 (H) 04/22/2016 0750   EOSABS 0.1 03/28/2022 1508   EOSABS 0.1 04/22/2016 0750   BASOSABS 0.0 03/28/2022 1508   BASOSABS 0.0 04/22/2016 0750    ASSESSMENT AND PLAN:  1. Type 2 diabetes mellitus with obesity At goal.  Patient taking and tolerating Mounjaro and metformin. Encouraged her to continue healthy eating habits and regular exercise. - POCT glucose (manual entry) - POCT glycosylated hemoglobin (Hb A1C)  2. Hypertension associated with type 2 diabetes mellitus At goal.  Continue medications listed above.  3. Hyperlipidemia associated with type 2 diabetes mellitus Continue Crestor 20 mg daily.  4. Chemotherapy-induced peripheral neuropathy On gabapentin.  5. Atherosclerosis of aorta Continue Crestor  6. Dermatitis - Ambulatory referral to Dermatology - triamcinolone cream (KENALOG) 0.1 %; Apply 1 Application topically 2 (two) times daily.  Dispense: 30 g; Refill: 0  7. Stage 3a chronic kidney disease Stable.  We will continue to monitor.  Avoid NSAIDs.  8. Chronic bilateral thoracic back pain 9. Chronic bilateral low back pain without sciatica Given tramadol on last visit but patient has not had to use it as yet.  Reports she is doing okay with using Robaxin as needed.  10. Hypercalcemia Etiology still remains questionable.  Appreciate input from  endocrinologist    Patient was given the opportunity to ask questions.  Patient verbalized understanding of the plan and was able to repeat key elements of the plan.   This documentation was completed using Paediatric nurseDragon voice recognition technology.  Any transcriptional errors are unintentional.  Orders Placed This Encounter  Procedures   POCT glucose (manual entry)   POCT glycosylated hemoglobin (Hb A1C)     Requested Prescriptions    No prescriptions requested or ordered in this encounter    No follow-ups on file.  Jonah Blueeborah Mckinzi Eriksen, MD, FACP

## 2022-09-23 DIAGNOSIS — H401133 Primary open-angle glaucoma, bilateral, severe stage: Secondary | ICD-10-CM | POA: Diagnosis not present

## 2022-09-23 DIAGNOSIS — H2513 Age-related nuclear cataract, bilateral: Secondary | ICD-10-CM | POA: Diagnosis not present

## 2022-09-23 DIAGNOSIS — H2511 Age-related nuclear cataract, right eye: Secondary | ICD-10-CM | POA: Diagnosis not present

## 2022-09-23 DIAGNOSIS — Z794 Long term (current) use of insulin: Secondary | ICD-10-CM | POA: Diagnosis not present

## 2022-09-23 DIAGNOSIS — E119 Type 2 diabetes mellitus without complications: Secondary | ICD-10-CM | POA: Diagnosis not present

## 2022-09-25 ENCOUNTER — Ambulatory Visit (INDEPENDENT_AMBULATORY_CARE_PROVIDER_SITE_OTHER): Payer: Medicare PPO | Admitting: Family Medicine

## 2022-09-25 ENCOUNTER — Encounter (INDEPENDENT_AMBULATORY_CARE_PROVIDER_SITE_OTHER): Payer: Self-pay | Admitting: Family Medicine

## 2022-09-25 VITALS — BP 130/85 | HR 61 | Temp 97.8°F | Ht 65.0 in | Wt 200.8 lb

## 2022-09-25 DIAGNOSIS — Z6833 Body mass index (BMI) 33.0-33.9, adult: Secondary | ICD-10-CM

## 2022-09-25 DIAGNOSIS — E1169 Type 2 diabetes mellitus with other specified complication: Secondary | ICD-10-CM | POA: Diagnosis not present

## 2022-09-25 DIAGNOSIS — Z9109 Other allergy status, other than to drugs and biological substances: Secondary | ICD-10-CM | POA: Diagnosis not present

## 2022-09-25 DIAGNOSIS — E669 Obesity, unspecified: Secondary | ICD-10-CM

## 2022-09-25 DIAGNOSIS — Z6832 Body mass index (BMI) 32.0-32.9, adult: Secondary | ICD-10-CM

## 2022-09-25 DIAGNOSIS — Z7985 Long-term (current) use of injectable non-insulin antidiabetic drugs: Secondary | ICD-10-CM

## 2022-09-25 MED ORDER — TIRZEPATIDE 12.5 MG/0.5ML ~~LOC~~ SOAJ
12.5000 mg | SUBCUTANEOUS | 0 refills | Status: DC
Start: 2022-09-25 — End: 2022-10-30
  Filled 2022-09-25: qty 2, 28d supply, fill #0

## 2022-09-25 MED ORDER — LEVOCETIRIZINE DIHYDROCHLORIDE 5 MG PO TABS
5.0000 mg | ORAL_TABLET | Freq: Every evening | ORAL | 0 refills | Status: DC
Start: 2022-09-25 — End: 2022-10-24

## 2022-09-25 MED ORDER — TIRZEPATIDE 12.5 MG/0.5ML ~~LOC~~ SOAJ: 12.5000 mg | SUBCUTANEOUS | 0 refills | Status: DC

## 2022-09-25 MED ORDER — MONTELUKAST SODIUM 10 MG PO TABS: 10.0000 mg | ORAL_TABLET | Freq: Every day | ORAL | 0 refills | Status: DC

## 2022-09-25 NOTE — Progress Notes (Signed)
Sarah [name], D.O.  ABFM, ABOM Specializing in Clinical Bariatric Medicine  Office located at: [address] [place], [place]  [address]     Assessment and Plan:    Medications Discontinued During This Encounter  Medication Reason   levocetirizine (XYZAL) 5 MG tablet Reorder   montelukast (SINGULAIR) 10 MG tablet Reorder   tirzepatide (MOUNJARO) 12.5 MG/0.5ML Pen Reorder     Meds ordered this encounter  Medications   levocetirizine (XYZAL) 5 MG tablet    Sig: Take 1 tablet (5 mg total) by mouth every evening.    Dispense:  30 tablet    Refill:  0   montelukast (SINGULAIR) 10 MG tablet    Sig: Take 1 tablet (10 mg total) by mouth at bedtime.    Dispense:  30 tablet    Refill:  0   tirzepatide (MOUNJARO) 12.5 MG/0.5ML Pen    Sig: Inject 12.5 mg into the skin once a week.    Dispense:  2 mL    Refill:  0      Type 2 diabetes mellitus with obesity Assessment: Condition is stable. Lab Results  Component Value Date   HGBA1C 6.0 09/17/2022   HGBA1C 5.5 05/17/2022   HGBA1C 5.5 01/09/2022  Patient is compliant with Mounjaro 12.5 mg weekly. Denies any side effects. She is worried about obtaining the [facility] in the future due to the national drug shortage.  Her hunger and cravings are well controlled.  Plan: Continue with med. Will refill this today.   Continue her prudent nutritional plan that is low in simple carbohydrates, saturated fats and trans fats to goal of 5-10% weight loss to achieve significant health benefits.  Pt encouraged to continually advance exercise and cardiovascular fitness as tolerated throughout weight loss journey.     Environmental allergies Assessment: Condition is stable.  She has been compliant with Singulair 10 mg daily and Xyzal 5 mg daily.She endorses that these  medications have helped with her allergy symptoms. Denies any side effects.  Plan: Will refill Xyzal and Singular today. Continue with meds.      TREATMENT PLAN FOR  OBESITY: BMI 32.0-32.9,adult-current bmi 33.4 Obesity with starting BMI 37.9/date 02/15/2020 Assessment: Condition is not optimized. Biometric data collected today, was reviewed with patient.  Fat mass has increased by .4lb. Muscle mass has increased by 1.8lb. Total body water has increased by 2.2lb.   Plan:  Sarah [name] is currently in the action stage of change. As such, her goal is to continue weight management plan. Sarah [name] will work on healthier eating habits and try their best to continue with Category 1 meal plan with breakfast and lunch options and Journaling.  I discussed with ways that patient can increase her lean protein intake and make foods more flavorful.   Behavioral Intervention Additional resources provided today: category 1 meal plan information, Food journaling plan information, breakfast options, and lunch options Evidence-based interventions for health behavior change were utilized today including the discussion of self monitoring techniques, problem-solving barriers and SMART goal setting techniques.   Regarding patient's less desirable eating habits and patterns, we employed the technique of small changes.  Pt will specifically work on: Journal caloric and protein intake everyday and bring food log for next visit.    Recommended Physical Activity Goals Sarah [name] has been advised to work up to 150 minutes of moderate intensity aerobic activity a week and strengthening exercises 2-3 times per week for cardiovascular health, weight loss maintenance and preservation of muscle mass.  She has agreed to Continue current level of physical activity     FOLLOW UP: Return in about 3 weeks (around 10/16/2022). She was informed of the importance of frequent follow up visits to maximize her success with intensive lifestyle modifications for her multiple health conditions.     Subjective:   Chief complaint: Obesity Sarah [name] is here to discuss her progress with her obesity treatment plan.  She is on the the Category 1 Plan with breakfast and lunch options and Journaling and states she is following her eating plan approximately 70% of the time. She states she is going to the gym and doing silver sneakers 30 minutes, 3 times a week.   Interval History:  Sarah [name] is here for a follow up office visit. Since last office visit she endorses she is not eating enough protein and is occasionally skipping breakfast. She also has not been journaling everyday. She reports eating chicken and fish strictly for the last two weeks. She states she has been stressed because of work and [employer]. She reports sleeping 4hrs daily. We reviewed her meal plan and all questions were answered.   Pharmacotherapy for weight loss: She is currently taking  Mounjaro  for medical weight loss.  Denies side effects.    Review of Systems:  Pertinent positives were addressed with patient today.   Weight Summary and Biometrics   Weight Lost Since Last Visit: 0  Weight Gained Since Last Visit: 2lb    Vitals Temp: 97.8 F (36.6 C) BP: 130/85 Pulse Rate: 61 SpO2: 99 %   Anthropometric Measurements Height:  (1.651 m) Weight: 200 lb 12.8 oz (91.1 kg) BMI (Calculated): 33.41 Weight at Last Visit: 198LB Weight Lost Since Last Visit: 0 Weight Gained Since Last Visit: 2lb Starting Weight: 228LB Total Weight Loss (lbs): 28 lb (12.7 kg) Peak Weight: 228LB   Body Composition  Body Fat %: 39.5 % Fat Mass (lbs): 79.4 lbs Muscle Mass (lbs): 115.2 lbs Total Body Water (lbs): 77.6 lbs Visceral Fat Rating : 11   Other Clinical Data Fasting: NO Labs: NO Today's Visit #: 60 Starting Date: 02/15/20     Objective:   PHYSICAL EXAM: Blood pressure 130/85, pulse 61, temperature 97.8 F (36.6 C), height  (1.651 m), weight 200 lb 12.8 oz (91.1 kg), SpO2 99 %. Body mass index is 33.41 kg/m.  General: Well Developed, well nourished, and in no acute distress.  HEENT:  Normocephalic, atraumatic Skin: Warm and dry, cap RF less 2 sec, good turgor Chest:  Normal excursion, shape, no gross abn Respiratory: speaking in full sentences, no conversational dyspnea NeuroM-Sk: Ambulates w/o assistance, moves * 4 Psych: A and O *3, insight good, mood-full  DIAGNOSTIC DATA REVIEWED:  BMET    Component Value Date/Time   NA 141 06/21/2022 1616   NA 138 04/22/2016 0750   K 4.2 06/21/2022 1616   K 3.9 04/22/2016 0750   CL 102 06/21/2022 1616   CO2 18 (L) 06/21/2022 1616   CO2 26 04/22/2016 0750   GLUCOSE 102 (H) 06/21/2022 1616   GLUCOSE 81 03/28/2022 1508   GLUCOSE 142 (H) 04/22/2016 0750   BUN 20 06/21/2022 1616   BUN 19.1 11/20/2016 1355   CREATININE 1.22 (H) 06/21/2022 1616   CREATININE 1.26 (H) 12/29/2019 1015   CREATININE 1.1 11/20/2016 1355   CALCIUM 12.2 (H) 06/21/2022 1616   CALCIUM 9.8 04/22/2016 0750   GFRNONAA 47 (L) 03/28/2022 1508   GFRNONAA 48 (L) 12/29/2019 1015   GFRNONAA  63 07/08/2014 1212   GFRAA 69 04/17/2020 0928   GFRAA 56 (L) 12/29/2019 1015   GFRAA 73 07/08/2014 1212   Lab Results  Component Value Date   HGBA1C 6.0 09/17/2022   HGBA1C 10.1 (H) 07/07/2015   No results found for: "INSULIN" Lab Results  Component Value Date   TSH 1.370 06/21/2022   CBC    Component Value Date/Time   WBC 5.4 03/28/2022 1508   RBC 4.97 03/28/2022 1508   HGB 15.0 03/28/2022 1508   HGB 13.3 02/03/2020 0837   HGB 13.6 12/04/2018 0914   HGB 12.9 04/22/2016 0750   HCT 46.8 (H) 03/28/2022 1508   HCT 41.0 12/04/2018 0914   HCT 39.0 04/22/2016 0750   PLT 318 03/28/2022 1508   PLT 270 02/03/2020 0837   PLT 332 12/04/2018 0914   MCV 94.2 03/28/2022 1508   MCV 88 12/04/2018 0914   MCV 92.2 04/22/2016 0750   MCH 30.2 03/28/2022 1508   MCHC 32.1 03/28/2022 1508   RDW 13.6 03/28/2022 1508   RDW 13.6 12/04/2018 0914   RDW 13.9 04/22/2016 0750   Iron Studies    Component Value Date/Time   IRON 43 08/18/2010 0515   TIBC 389 08/18/2010 0515    FERRITIN 227 08/18/2010 0515   IRONPCTSAT 11 (L) 08/18/2010 0515   Lipid Panel     Component Value Date/Time   CHOL 167 01/09/2022 0855   TRIG 192 (H) 01/09/2022 0855   HDL 45 01/09/2022 0855   CHOLHDL 3.7 01/09/2022 0855   CHOLHDL 5.1 (H) 08/14/2016 1729   VLDL 74 (H) 08/14/2016 1729   LDLCALC 89 01/09/2022 0855   Hepatic Function Panel     Component Value Date/Time   PROT 7.4 01/09/2022 0855   PROT 8.2 04/22/2016 0750   ALBUMIN 4.6 01/09/2022 0855   ALBUMIN 3.8 04/22/2016 0750   AST 26 01/09/2022 0855   AST 29 12/29/2019 1015   AST 27 04/22/2016 0750   ALT 29 01/09/2022 0855   ALT 33 12/29/2019 1015   ALT 24 04/22/2016 0750   ALKPHOS 74 01/09/2022 0855   ALKPHOS 74 04/22/2016 0750   BILITOT 0.5 01/09/2022 0855   BILITOT 0.6 12/29/2019 1015   BILITOT 0.44 04/22/2016 0750   BILIDIR 0.2 08/18/2010 0515   IBILI 1.5 (H) 08/18/2010 0515      Component Value Date/Time   TSH 1.370 06/21/2022 1616   Nutritional Lab Results  Component Value Date   VD25OH 31.2 06/21/2022   VD25OH 35.7 10/22/2021   VD25OH 99.6 06/26/2021    Attestations:   Reviewed by clinician on day of visit: allergies, medications, problem list, medical history, surgical history, family history, social history, and previous encounter notes.  I,Special Puri,acting as a Neurosurgeon for [employer], DO.,have documented all relevant documentation on the behalf of Thomasene Lot, DO,as directed by  Thomasene Lot, DO while in the presence of Thomasene Lot, DO.   I, Thomasene Lot, DO, have reviewed all documentation for this visit. The documentation on 09/25/22 for the exam, diagnosis, procedures, and orders are all accurate and complete.

## 2022-09-26 ENCOUNTER — Other Ambulatory Visit: Payer: Self-pay

## 2022-09-26 ENCOUNTER — Other Ambulatory Visit (HOSPITAL_COMMUNITY): Payer: Self-pay

## 2022-09-27 ENCOUNTER — Other Ambulatory Visit (HOSPITAL_COMMUNITY): Payer: Self-pay

## 2022-10-09 ENCOUNTER — Encounter (INDEPENDENT_AMBULATORY_CARE_PROVIDER_SITE_OTHER): Payer: Self-pay | Admitting: Family Medicine

## 2022-10-09 ENCOUNTER — Ambulatory Visit (INDEPENDENT_AMBULATORY_CARE_PROVIDER_SITE_OTHER): Payer: Medicare PPO | Admitting: Family Medicine

## 2022-10-09 VITALS — BP 102/68 | HR 68 | Temp 97.5°F | Ht 65.0 in | Wt 200.0 lb

## 2022-10-09 DIAGNOSIS — Z7985 Long-term (current) use of injectable non-insulin antidiabetic drugs: Secondary | ICD-10-CM | POA: Diagnosis not present

## 2022-10-09 DIAGNOSIS — K219 Gastro-esophageal reflux disease without esophagitis: Secondary | ICD-10-CM

## 2022-10-09 DIAGNOSIS — Z6833 Body mass index (BMI) 33.0-33.9, adult: Secondary | ICD-10-CM | POA: Diagnosis not present

## 2022-10-09 DIAGNOSIS — E669 Obesity, unspecified: Secondary | ICD-10-CM

## 2022-10-09 DIAGNOSIS — Z6832 Body mass index (BMI) 32.0-32.9, adult: Secondary | ICD-10-CM

## 2022-10-09 DIAGNOSIS — E1169 Type 2 diabetes mellitus with other specified complication: Secondary | ICD-10-CM

## 2022-10-09 MED ORDER — OMEPRAZOLE 20 MG PO CPDR: 20.0000 mg | DELAYED_RELEASE_CAPSULE | Freq: Every day | ORAL | 0 refills | Status: DC

## 2022-10-09 NOTE — Progress Notes (Addendum)
Sarah [name], D.O.  ABFM, ABOM Specializing in Clinical Bariatric Medicine  Office located at: [address] [place], [place]  [address]     Assessment and Plan:    Medications Discontinued During This Encounter  Medication Reason   omeprazole (PRILOSEC) 20 MG capsule Reorder     Meds ordered this encounter  Medications   omeprazole (PRILOSEC) 20 MG capsule    Sig: Take 1 capsule (20 mg total) by mouth daily.    Dispense:  90 capsule    Refill:  0      Type 2 diabetes mellitus with obesity (HCC) Assessment: Condition is worsening  - Labs from PCP were reviewed with pt today per their request and education provided on them and how the foods patient eats may influence these findings. All questions were answered about them.   Lab Results  Component Value Date   HGBA1C 6.0 09/17/2022   HGBA1C 5.5 05/17/2022   HGBA1C 5.5 01/09/2022  - Her A1c increased from 5.5 to 6.0 on 09/17/22 - She reports good compliance and tolerance with Mounjaro 12.5 mg weekly. Denies any side effects. - She endorses that her blood sugar is around the 90s at home. She reports not having any high/lows.  - She states not feeling hungry and not having cravings.   Plan: - Continue with Mounjaro, no change in dose. Patient denies need for refill.  - Counseling done on eating on a regular basis- no skipping or going long periods without eating.      - Counseled patient on pathophysiology of disease and discussed how good blood sugar control is important to decrease the risk of diabetic complications such as nephropathy, neuropathy, limb loss, blindness, coronary artery disease, etc.   - Intensive lifestyle modification including diet, exercise and weight loss are the first line of treatment for diabetes. We extensively discussed the importance of decreasing simple carbs and how certain foods they eat will affect their blood sugars - Reminded Sarah [name] if she feels poorly- check Blood Sugar  and Blood Pressure at that time.    - Hypoglycemia prevention discussed with the patient as above - Importance of f/up with PCP and all other specialists, as scheduled, was stressed to the patient today     Gastroesophageal reflux disease, unspecified whether esophagitis present Assessment: Condition is stable. - She endorses that she does not eat after 6 pm.  - She states that her reflux is well controlled .She is compliant with her Prilosec 20mg  daily, denies any side effects.   Plan:  - Continue with med as prescribed. Will refill Prilosec today per her request.   -  Continue with caffeine reduction, dietary changes, elevate HOB, NPO after supper, and continue with weight loss.    TREATMENT PLAN FOR OBESITY: BMI 32.0-32.9,adult-current bmi 33.28 Obesity with starting BMI 37.9/date 02/15/2020 Assessment: Condition is stable. Biometric data collected today, was reviewed with patient.  Fat mass has increased by 1 lb. Muscle mass has decreased by 1.6 lb. Total body water has decreased by 1.2 lb.   Plan:  -Juwanna is currently in the action stage of change. As such, her goal is to continue weight management plan. -[name] will work on healthier eating habits and continue with the Category 1 meal plan with breakfast and lunch options and journaling intake - - Unless pre-existing renal or cardiopulmonary conditions exist which patient was told to limit their fluid intake by another provider, I recommended roughly one half of their weight in  pounds, to be the approximate ounces of non-caloric, non-caffeinated beverages they should drink per day; including more if they are engaging in exercise.   Behavioral Intervention Additional resources provided today:  food journal log Evidence-based interventions for health behavior change were utilized today including the discussion of self monitoring techniques, problem-solving barriers and SMART goal setting techniques.   Regarding patient's less  desirable eating habits and patterns, we employed the technique of small changes.  Pt will specifically work on: journal intake and bringing food log for next visit.    Recommended Physical Activity Goals [name] has been advised to work up to 150 minutes of moderate intensity aerobic activity a week and strengthening exercises 2-3 times per week for cardiovascular health, weight loss maintenance and preservation of muscle mass.  She has agreed to Continue current level of physical activity   FOLLOW UP: Return in about 3 weeks (around 10/30/2022).   She was informed of the importance of frequent follow up visits to maximize her success with intensive lifestyle modifications for her multiple health conditions.   Subjective:   Chief complaint: Obesity Karesa is here to discuss her progress with her obesity treatment plan. She is on the the Category 1 Plan with breakfast and lunch options and states she is following her eating plan approximately 80% of the time. She states she is exercising (silver sneakers/walking) 30-50 minutes 5 days per week.  Interval History:  Sarah [name] is here for a follow up office visit.  Since last office visit she: - She endorses that she has been journaling her intake and learned that on most days she was under on calories and protein. On one occasion, she endorses only eating 350 calories over the entire day!  - She reports not feeling hungry, and hence skips meals  - She also endorses that she has been to a few parties since last OV and ate higher caloric foods.   Pharmacotherapy for weight loss: She is currently taking  Mounjaro  for medical weight loss.  Denies side effects.    Review of Systems:  Pertinent positives were addressed with patient today.   Weight Summary and Biometrics   Weight Lost Since Last Visit: 0 lb  Weight Gained Since Last Visit: 0 lb    Vitals Temp: (!) 97.5 F (36.4 C) BP: 102/68 Pulse Rate: 68 SpO2: 100  %   Anthropometric Measurements Height: 5\' 5"  (1.651 m) Weight: 200 lb (90.7 kg) BMI (Calculated): 33.28 Weight at Last Visit: 200 lb Weight Lost Since Last Visit: 0 lb Weight Gained Since Last Visit: 0 lb Starting Weight: 228 lb Peak Weight: 228 lb   Body Composition  Body Fat %: 40.2 % Fat Mass (lbs): 80.4 lbs Muscle Mass (lbs): 1136 lbs Total Body Water (lbs): 76.4 lbs Visceral Fat Rating : 11   Other Clinical Data Fasting: No Labs: No Today's Visit #: 61 Starting Date: 02/15/20    Objective:   PHYSICAL EXAM: Blood pressure 102/68, pulse 68, temperature (!) 97.5 F (36.4 C), height 5\' 5"  (1.651 m), weight 200 lb (90.7 kg), SpO2 100 %. Body mass index is 33.28 kg/m.  General: Well Developed, well nourished, and in no acute distress.  HEENT: Normocephalic, atraumatic Skin: Warm and dry, cap RF less 2 sec, good turgor Chest:  Normal excursion, shape, no gross abn Respiratory: speaking in full sentences, no conversational dyspnea NeuroM-Sk: Ambulates w/o assistance, moves * 4 Psych: A and O *3, insight good, mood-full  DIAGNOSTIC DATA REVIEWED:  BMET  Component Value Date/Time   NA 141 06/21/2022 1616   NA 138 04/22/2016 0750   K 4.2 06/21/2022 1616   K 3.9 04/22/2016 0750   CL 102 06/21/2022 1616   CO2 18 (L) 06/21/2022 1616   CO2 26 04/22/2016 0750   GLUCOSE 102 (H) 06/21/2022 1616   GLUCOSE 81 03/28/2022 1508   GLUCOSE 142 (H) 04/22/2016 0750   BUN 20 06/21/2022 1616   BUN 19.1 11/20/2016 1355   CREATININE 1.22 (H) 06/21/2022 1616   CREATININE 1.26 (H) 12/29/2019 1015   CREATININE 1.1 11/20/2016 1355   CALCIUM 12.2 (H) 06/21/2022 1616   CALCIUM 9.8 04/22/2016 0750   GFRNONAA 47 (L) 03/28/2022 1508   GFRNONAA 48 (L) 12/29/2019 1015   GFRNONAA 63 07/08/2014 1212   GFRAA 69 04/17/2020 0928   GFRAA 56 (L) 12/29/2019 1015   GFRAA 73 07/08/2014 1212   Lab Results  Component Value Date   HGBA1C 6.0 09/17/2022   HGBA1C 10.1 (H) 07/07/2015    No results found for: "INSULIN" Lab Results  Component Value Date   TSH 1.370 06/21/2022   CBC    Component Value Date/Time   WBC 5.4 03/28/2022 1508   RBC 4.97 03/28/2022 1508   HGB 15.0 03/28/2022 1508   HGB 13.3 02/03/2020 0837   HGB 13.6 12/04/2018 0914   HGB 12.9 04/22/2016 0750   HCT 46.8 (H) 03/28/2022 1508   HCT 41.0 12/04/2018 0914   HCT 39.0 04/22/2016 0750   PLT 318 03/28/2022 1508   PLT 270 02/03/2020 0837   PLT 332 12/04/2018 0914   MCV 94.2 03/28/2022 1508   MCV 88 12/04/2018 0914   MCV 92.2 04/22/2016 0750   MCH 30.2 03/28/2022 1508   MCHC 32.1 03/28/2022 1508   RDW 13.6 03/28/2022 1508   RDW 13.6 12/04/2018 0914   RDW 13.9 04/22/2016 0750   Iron Studies    Component Value Date/Time   IRON 43 08/18/2010 0515   TIBC 389 08/18/2010 0515   FERRITIN 227 08/18/2010 0515   IRONPCTSAT 11 (L) 08/18/2010 0515   Lipid Panel     Component Value Date/Time   CHOL 167 01/09/2022 0855   TRIG 192 (H) 01/09/2022 0855   HDL 45 01/09/2022 0855   CHOLHDL 3.7 01/09/2022 0855   CHOLHDL 5.1 (H) 08/14/2016 1729   VLDL 74 (H) 08/14/2016 1729   LDLCALC 89 01/09/2022 0855   Hepatic Function Panel     Component Value Date/Time   PROT 7.4 01/09/2022 0855   PROT 8.2 04/22/2016 0750   ALBUMIN 4.6 01/09/2022 0855   ALBUMIN 3.8 04/22/2016 0750   AST 26 01/09/2022 0855   AST 29 12/29/2019 1015   AST 27 04/22/2016 0750   ALT 29 01/09/2022 0855   ALT 33 12/29/2019 1015   ALT 24 04/22/2016 0750   ALKPHOS 74 01/09/2022 0855   ALKPHOS 74 04/22/2016 0750   BILITOT 0.5 01/09/2022 0855   BILITOT 0.6 12/29/2019 1015   BILITOT 0.44 04/22/2016 0750   BILIDIR 0.2 08/18/2010 0515   IBILI 1.5 (H) 08/18/2010 0515      Component Value Date/Time   TSH 1.370 06/21/2022 1616   Nutritional Lab Results  Component Value Date   VD25OH 31.2 06/21/2022   VD25OH 35.7 10/22/2021   VD25OH 99.6 06/26/2021    Attestations:   Reviewed by clinician on day of visit: allergies,  medications, problem list, medical history, surgical history, family history, social history, and previous encounter notes.  I,Special Puri,acting as a Neurosurgeon for [employer]  Gelena Klosinski, DO.,have documented all relevant documentation on the behalf of Thomasene Lot, DO,as directed by  Thomasene Lot, DO while in the presence of Thomasene Lot, DO.   I, Thomasene Lot, DO, have reviewed all documentation for this visit. The documentation on 10/09/22 for the exam, diagnosis, procedures, and orders are all accurate and complete.

## 2022-10-14 ENCOUNTER — Other Ambulatory Visit (HOSPITAL_COMMUNITY): Payer: Self-pay

## 2022-10-14 MED ORDER — AMOXICILLIN 500 MG PO CAPS
500.0000 mg | ORAL_CAPSULE | Freq: Three times a day (TID) | ORAL | 0 refills | Status: DC
  Filled 2022-10-14: qty 15, 5d supply, fill #0

## 2022-10-14 MED ORDER — IBUPROFEN 800 MG PO TABS
800.0000 mg | ORAL_TABLET | Freq: Three times a day (TID) | ORAL | 0 refills | Status: DC | PRN
  Filled 2022-10-14: qty 15, 5d supply, fill #0

## 2022-10-14 MED ORDER — HYDROCODONE-ACETAMINOPHEN 5-325 MG PO TABS
1.0000 | ORAL_TABLET | ORAL | 0 refills | Status: DC | PRN
  Filled 2022-10-14: qty 12, 2d supply, fill #0

## 2022-10-21 ENCOUNTER — Ambulatory Visit: Payer: Medicare PPO | Admitting: Internal Medicine

## 2022-10-21 ENCOUNTER — Encounter: Payer: Self-pay | Admitting: Internal Medicine

## 2022-10-21 DIAGNOSIS — E042 Nontoxic multinodular goiter: Secondary | ICD-10-CM | POA: Diagnosis not present

## 2022-10-21 LAB — VITAMIN D 25 HYDROXY (VIT D DEFICIENCY, FRACTURES): VITD: 29.26 ng/mL — ABNORMAL LOW (ref 30.00–100.00)

## 2022-10-21 NOTE — Progress Notes (Signed)
Patient ID: Sarah [name], female   DOB: [birth year], 57 y.o.   MRN: [id number]  HPI  Sarah [name] is a 57 y.o.-year-old female, returning for follow-up for hypercalcemia/hyperparathyroidism, also thyroid nodules and controlled DM2.  She previously saw Dr. Everardo All, last visit with him was a video visit almost 2 years ago.  Interim history: She c/o constipation, but no other complaints. She continues to be followed in Weight management clinic. On Mounjaro.  Hypercalcemia:  Pt was dx with hypercalcemia in 2020. I reviewed pt's pertinent labs: Lab Results  Component Value Date   PTH 16 06/21/2022   PTH 26 07/26/2020   PTH 26 04/29/2019   PTH Comment 04/29/2019   PTH 21 03/22/2019   PTH Comment 03/22/2019   Component     Latest Ref Rng 06/21/2022  Calcium Ionized     4.7 - 5.5 mg/dL 5.9 (H)    Lab Results  Component Value Date   CALCIUM 12.2 (H) 06/21/2022   CALCIUM 11.3 (H) 03/28/2022   CALCIUM 12.1 (H) 01/09/2022   CALCIUM 10.5 (H) 07/04/2021   CALCIUM 11.3 (H) 06/26/2021   CALCIUM 12.1 (H) 05/28/2021   CALCIUM 12.3 (H) 03/07/2021   CALCIUM 11.5 (H) 11/29/2020   CALCIUM 11.7 (H) 07/26/2020   CALCIUM 11.9 (H) 04/17/2020   CALCIUM 10.7 (H) 01/11/2020   CALCIUM 11.7 (H) 12/29/2019   CALCIUM 10.9 (H) 10/06/2019   CALCIUM 10.6 (H) 09/29/2019   CALCIUM 12.0 (H) 09/23/2019   CALCIUM 10.5 (H) 06/16/2019   CALCIUM 11.1 (H) 04/29/2019   CALCIUM 10.9 (H) 03/22/2019   CALCIUM 10.6 (H) 12/04/2018   CALCIUM 10.4 (H) 11/23/2018   CALCIUM 10.0 08/28/2018   CALCIUM 9.7 11/17/2017   CALCIUM 9.8 04/22/2016   CALCIUM 10.1 01/22/2016   CALCIUM 10.3 10/02/2015   CALCIUM 8.9 07/10/2015   CALCIUM 9.2 07/09/2015   CALCIUM 9.3 07/08/2015   CALCIUM 9.4 07/07/2015   CALCIUM 9.5 07/03/2015   CALCIUM 9.9 04/27/2015   CALCIUM 10.1 03/27/2015   CALCIUM 8.9 12/22/2014   CALCIUM 9.5 12/08/2014   CALCIUM 9.2 11/24/2014   CALCIUM 9.0 11/17/2014   CALCIUM 8.7 11/03/2014   CALCIUM 10.5 (H)  10/27/2014   CALCIUM 8.4 10/18/2014   CALCIUM 9.3 10/03/2014   CALCIUM 9.4 09/22/2014   CALCIUM 10.3 09/15/2014   CALCIUM 9.2 09/08/2014   CALCIUM 9.5 09/01/2014   CALCIUM 9.7 08/19/2014   CALCIUM 9.1 07/31/2014   CALCIUM 9.4 07/30/2014   CALCIUM 8.5 07/29/2014   CALCIUM 10.4 07/08/2014   CALCIUM 8.3 (L) 06/29/2014   A 24-hour urine calcium was normal (no creatinine associated with the reading): Component     Latest Ref Rng 08/16/2020  Calcium, 24H Urine     mg/24 h 81    A repeat 24-hour urine calcium was low: Component     Latest Ref Rng 06/24/2022  Calcium, 24H Urine     mg/24 h 30 (L)   Creatinine, 24H Ur     0.50 - 2.15 g/24 h 1.02   The fractional excretion of calcium is 0.0029% (<0.01%).  She does not take calcium supplements.  No fractures or falls or evidence of osteoporosis.   No h/o kidney stones.  She has CKD stage III. Last BUN/Cr: Lab Results  Component Value Date   BUN 20 06/21/2022   BUN 19 03/28/2022   CREATININE 1.22 (H) 06/21/2022   CREATININE 1.32 (H) 03/28/2022   Pt was previously on HCTZ, which was stopped by Dr. Everardo All.  She is not on  vitamin A.  She has a h/o vitamin D deficiency. Reviewed vit D levels: Lab Results  Component Value Date   VD25OH 31.2 06/21/2022   VD25OH 35.7 10/22/2021   VD25OH 99.6 06/26/2021   VD25OH 81.1 03/07/2021   VD25OH 60.6 11/29/2020   VD25OH 41.54 07/26/2020   VD25OH 44.6 04/17/2020   VD25OH 38.2 02/15/2020   VD25OH 10.2 (L) 05/14/2019  She is not taking vitamin D  - off x 1 year (was on Ergocalciferol).  Calcitriol, vit A and PTH-rp levels were normal: Component     Latest Ref Rng 03/22/2019 04/29/2019 07/26/2020  Vitamin D 1, 25 (OH) Total     18 - 72 pg/mL   24   Vitamin D3 1, 25 (OH)     pg/mL   <8   Vitamin D2 1, 25 (OH)     pg/mL   24   PTH-related peptide     pmol/L <2.0  <2.0    Vitamin A (Retinoic Acid)     38 - 98 mcg/dL   73   PTH-Related Protein (PTH-RP)     11 - 20 pg/mL   17     Magnesium levels were reviewed: Lab Results  Component Value Date   MG 1.6 06/21/2022   MG 1.5 (L) 01/09/2022   MG 1.6 11/29/2020   MG 1.7 07/12/2015   MG 1.9 07/11/2015   MG 2.0 07/10/2015   MG 2.2 07/09/2015   MG 2.3 07/08/2015   MG 1.9 08/19/2010   Phosphorus level was slightly low, Lab Results  Component Value Date   PHOS 2.6 (L) 06/21/2022   Of note, patient has a history of increased free light chains - she had bone marrow biopsy for this and was seen by hematology but no further workup is needed for this and no follow-up was recommended.  Also, on the chest CT scan from 07/11/2020, 1.1 cm right lower lobe nodule appears to be mildly progressive, with slow growth, but without increased uptake on the PET scan and dotatate scan.   The radiologist suggested that this may be an indolent neoplasm such as pulmonary carcinoid.  Nuclear medicine bone scan (07/09/2022): Uptake at the shoulders, sternoclavicular joints, wrists, knees, ankles/feet, typically degenerative. Uptake at lateral aspects of lower lumbar spine at L4-L5 likely reflecting degenerative changes. No additional worrisome sites of abnormal osseous tracer accumulation are identified. Expected urinary tract and soft tissue distribution of tracer.   IMPRESSION: Scattered degenerative type uptake of tracer at multiple joints and at lower lumbar spine. No scintigraphic evidence of osseous metastatic disease.  Pt does not have a FH of hypercalcemia, pituitary tumors, thyroid cancer, or osteoporosis.  Possible FH of kidney stones in father.  Thyroid nodules: Patient was found to have several thyroid nodules:  Thyroid U/S (08/17/2010): Right thyroid lobe:  Measures 5.1 x 1.5 x 1.8 cm.  Heterogeneous echogenicity.  Left thyroid lobe:  Measures 8.6 x 3.6 x 4.5 cm.  Heterogeneous echogenicity.  Isthmus: Measures 1.3 cm.   Focal nodules:  There are multiple bilateral thyroid nodules:   The largest lesion in the right  lobe measures 1.4 x 0.8 x 1.0 cm and is in the upper pole region.   There is a large lesion in the isthmus which measures 4.0 x 1.5 x 2.5 cm.   The left lobe is occupied by to large complex thyroid nodules which  are largely solid but have cystic components.  The largest lesion  measures 4.8 x 3.7 x 4.1 cm and is  in the lower aspect of the lobe.   The adjacent lesion measures 4.1 x 2.5 x 4.0 cm.  Fine-needle  aspiration/biopsy of the two left lobe lesions are suggested to  exclude malignancy.   Lymphadenopathy:  None visualized.   IMPRESSION:   Multinodular thyroid goiter.  The largest lesions in the left  lower lobe are complex and fine-needle aspiration/biopsy is  suggested.  The other lesions can be followed depending on the  pathology.   FNA of the 2 L thyroid nodules (08/29/2010): Both benign  She had left thyroidectomy + isthmusectomy (10/23/2010):  Thyroid, lobectomy, left with isthmus - ADENOMATOUS GOITER. - THERE IS NO EVIDENCE OF MALIGNANCY.  Grossly, there are two dominant lesions present. The larger measures 6.2 cm and is present in the left lobe. There is a smaller nodule present in the isthmus, spanning 2.2 cm. Histologic evaluation of both lesions reveals mixed macro and microfollicular adenomas. Malignant features are not identified. (JK:mw 10-24-10)  Thyroid U/S (10/22/2019): 3 nodules, spongiform and solid/cystic nodular areas that did not need follow-up: Parenchymal Echotexture: Moderately heterogenous  Isthmus: Surgically absent.  Right lobe: 6.2 x 2.1 x 2.8 cm  Left lobe: Surgically absent.  _________________________________________________________   Estimated total number of nodules >/= 1 cm: 3  _________________________________________________________   Nodule # 1:  Prior biopsy: No  Location: Right; Superior  Maximum size: 1.7 cm; Other 2 dimensions: 1.4 x 1.4 cm, previously, 1.4 cm  Composition: mixed cystic and solid (1)  Echogenicity: isoechoic  (1) Change in features: Largely cystic and demonstrating interval cystic degeneration since 2012. Change in ACR TI-RADS risk category: Yes This nodule does NOT meet TI-RADS criteria for biopsy or dedicated follow-up.  _________________________________________________________   Nodule # 2:  Location: Right; Inferior  Maximum size: 2.1 cm; Other 2 dimensions: 1.2 x 1.3 cm  Composition: spongiform (0) This nodule does NOT meet TI-RADS criteria for biopsy or dedicated follow-up.  _________________________________________________________   Three other small vague areas were measured in the tip of the right lobe inferiorly which all appear to be adjacent/part of the same spongiform area referred to as nodule #2 above. Irregardless, these spongiform areas even if they were considered separate nodules do not meet criteria for biopsy or further follow-up.   No abnormal tissue is identified in the left neck at the level of prior thyroidectomy. No abnormal lymph nodes identified.   IMPRESSION: Residual right lobe of the thyroid demonstrates spongiform and solid/cystic nodular areas that do not meet criteria for biopsy or further follow-up.   Previous thyroid tests reviewed: Lab Results  Component Value Date   TSH 1.370 06/21/2022   TSH 0.938 02/15/2020   TSH 0.977 04/29/2019   TSH 0.698 03/22/2019   TSH 0.75 08/14/2016   TSH 1.860 07/31/2014   TSH 1.867 12/10/2010   TSH 0.996 08/16/2010   Lab Results  Component Value Date   FREET4 1.29 02/15/2020   FREET4 1.01 12/10/2010   FREET4 1.07 08/16/2010   + FH of thyroid ds. In M and sister.  DM2: - dx'ed 2015-2016 at the time of her ovarian surgery - insulin soon after DM  Reviewed HbA1c levels: Lab Results  Component Value Date   HGBA1C 6.0 09/17/2022   HGBA1C 5.5 05/17/2022   HGBA1C 5.5 01/09/2022   HGBA1C 5.7 10/04/2021   HGBA1C 6.2 (H) 06/26/2021   HGBA1C 7.0 (H) 03/07/2021   HGBA1C 7.2 (H) 11/29/2020   HGBA1C 5.6  06/14/2020   HGBA1C 6.9 (H) 04/17/2020   HGBA1C 9.0 (H) 01/11/2020  HGBA1C 9.8 (A) 09/23/2019   HGBA1C 9.3 (A) 03/22/2019   HGBA1C 10.8 (H) 12/04/2018   HGBA1C 9.6 (H) 08/28/2018   HGBA1C 7.6 (A) 05/22/2018   HGBA1C 7.6 (A) 02/20/2018   HGBA1C 7.8 09/03/2017   HGBA1C 8.6 06/18/2017   HGBA1C 7.7 02/17/2017   HGBA1C 7.3 11/13/2016   She is on the following regimen: - Metformin 500 mg once a day, recently reduced from twice a day - Lantus 20 >> 10 units daily >> off  - Mounjaro 12.5 mg weekly  She is seen in the Weight management clinic. She is exercising 3 times a week at the [facility].  Pt checks her sugars 1-2x a day a day and they are: - am: <110 - 2h after b'fast: n/c - before lunch: n/c - 2h after lunch: n/c - before dinner: n/c - 2h after dinner: n/c - bedtime: <140 - nighttime: n/c Lowest sugar was 89; she has hypoglycemia awareness at 60.  Highest sugar was 148.  Glucometer: Accu-Chek  - + CKD, last BUN/creatinine:  Lab Results  Component Value Date   BUN 20 06/21/2022   BUN 19 03/28/2022   CREATININE 1.22 (H) 06/21/2022   CREATININE 1.32 (H) 03/28/2022  On Cozaar 50 mg daily.  -+ HL; last set of lipids: Lab Results  Component Value Date   CHOL 167 01/09/2022   HDL 45 01/09/2022   LDLCALC 89 01/09/2022   TRIG 192 (H) 01/09/2022   CHOLHDL 3.7 01/09/2022  On Crestor 20 mg daily.  - last eye exam was in 05/2022. No DR reportedly. + Glaucoma, cataracts.  - + numbness and tingling in her feet -due to chemotherapy >> improved.  On Neurontin.  Pt. also has a history of HTN, GERD, B12 deficiency, obesity class II, depression, and also history of ovarian cancer for which she had chemotherapy in the past, with subsequent peripheral neuropathy-on gabapentin.  Also, she was found to have aortic atherosclerosis on the CT scan from 07/2020.  ROS: + see HPI  Past Medical History:  Diagnosis Date   Arthritis    Diabetes mellitus without complication (HCC)     Family history of adverse reaction to anesthesia    sister-n/v   Family history of colon cancer    GERD (gastroesophageal reflux disease)    Glaucoma 07/2014   Glucagonoma 07/28/2014   Pt denies this but states she has glaucoma   History of chemotherapy    History of hiatal hernia    Hypercalcemia    Hypertension    Imbalance    Neuropathy    feet bilat   Nocturia    Peritoneal carcinomatosis (HCC)    carcinoma of ovary    Shortness of breath dyspnea    hx of 2013 - no problems currently    Sleep apnea    Thyroid nodule    history of    Wears glasses    Past Surgical History:  Procedure Laterality Date   ABDOMINAL HYSTERECTOMY     FOOT SURGERY  04/2018   [facility]   HERNIA REPAIR     INCISIONAL HERNIA REPAIR N/A 07/06/2015   Procedure: INCISIONAL HERNIA REPAIR ;  Surgeon: Emelia Loron, MD;  Location: [facility];  Service: General;  Laterality: N/A;   INCISIONAL HERNIA REPAIR N/A 07/03/2021   Procedure: Sherald Hess HERNIA REPAIR WITH MESH;  Surgeon: Axel Filler, MD;  Location: [facility];  Service: General;  Laterality: N/A;   INSERTION OF MESH N/A 07/06/2015   Procedure: WITH INSERTION OF PHASIX ST  MESH;  Surgeon: Emelia Loron, MD;  Location: [facility];  Service: General;  Laterality: N/A;   LAPAROTOMY N/A 07/06/2015   Procedure: EXPLORATORY LAPAROTOMY;  Surgeon: Adolphus Birchwood, MD;  Location: [facility];  Service: Gynecology;  Laterality: N/A;   LYSIS OF ADHESION N/A 07/06/2015   Procedure:  LYSIS OF ADHESION RESECTION OF MESENTERIC MASS BOWEL RESECTION ;  Surgeon: Adolphus Birchwood, MD;  Location: [facility];  Service: Gynecology;  Laterality: N/A;   LYSIS OF ADHESION N/A 07/03/2021   Procedure: EXPLORATORY LAPAROTOMY WITH LYSIS OF ADHESION;  Surgeon: Axel Filler, MD;  Location: [facility] OR;  Service: General;  Laterality: N/A;   ROBOTIC ASSISTED TOTAL HYSTERECTOMY WITH BILATERAL SALPINGO OOPHERECTOMY Bilateral 07/28/2014   Procedure: ROBOTIC ASSISTED lysis of adhesions with biopsies,  converted to LAPAROTOMY, bilateral salpingoorphorectomy, omentectomy,appendectomy;  Surgeon: Laurette Schimke, MD;  Location: [facility];  Service: Gynecology;  Laterality: Bilateral;   THYROIDECTOMY, PARTIAL     VIDEO BRONCHOSCOPY N/A 01/13/2020   Procedure: VIDEO BRONCHOSCOPY WITH BIOPSIES;  Surgeon: Loreli Slot, MD;  Location: [facility] OR;  Service: Thoracic;  Laterality: N/A;   Social History   Socioeconomic History   Marital status: Married    Spouse name: Not on file   Number of children: 2   Years of education: Not on file   Highest education level: 12th grade  Occupational History   Occupation: disability  Tobacco Use   Smoking status: Never   Smokeless tobacco: Never  Vaping Use   Vaping Use: Never used  Substance and Sexual Activity   Alcohol use: No   Drug use: No   Sexual activity: Not Currently    Birth control/protection: None  Other Topics Concern   Not on file  Social History Narrative   No issues with transportation.    Attends church   Social Determinants of Health   Financial Resource Strain: Low Risk  (09/16/2022)   Overall Financial Resource Strain (CARDIA)    Difficulty of Paying Living Expenses: Not very hard  Food Insecurity: No Food Insecurity (09/16/2022)   Hunger Vital Sign    Worried About Running Out of Food in the Last Year: Never true    Ran Out of Food in the Last Year: Never true  Transportation Needs: No Transportation Needs (09/16/2022)   PRAPARE - Administrator, Civil Service (Medical): No    Lack of Transportation (Non-Medical): No  Physical Activity: Sufficiently Active (09/16/2022)   Exercise Vital Sign    Days of Exercise per Week: 3 days    Minutes of Exercise per Session: 50 min  Stress: No Stress Concern Present (09/16/2022)   [employer] of Occupational Health - Occupational Stress Questionnaire    Feeling of Stress : Only a little  Social Connections: Moderately Integrated (09/16/2022)   Social Connection and  Isolation Panel [NHANES]    Frequency of Communication with Friends and Family: More than three times a week    Frequency of Social Gatherings with Friends and Family: Twice a week    Attends Religious Services: More than 4 times per year    Active Member of [employer] or Organizations: Yes    Attends Engineer, structural: More than 4 times per year    Marital Status: Separated  Recent Concern: Social Connections - Moderately Isolated (06/28/2022)   Social Connection and Isolation Panel [NHANES]    Frequency of Communication with Friends and Family: More than three times a week    Frequency of Social Gatherings with Friends and Family:  More than three times a week    Attends Religious Services: More than 4 times per year    Active Member of Clubs or Organizations: No    Attends Banker Meetings: Never    Marital Status: Separated  Intimate Partner Violence: Not At Risk (06/28/2022)   Humiliation, Afraid, Rape, and Kick questionnaire    Fear of Current or Ex-Partner: No    Emotionally Abused: No    Physically Abused: No    Sexually Abused: No   Current Outpatient Medications on File Prior to Visit  Medication Sig Dispense Refill   ACCU-CHEK AVIVA PLUS test strip TEST BLOOD SUGAR AS DIRECTED 300 strip 3   Accu-Chek Softclix Lancets lancets TEST BLOOD SUGAR THREE TIMES DAILY 300 each 3   albuterol (VENTOLIN HFA) 108 (90 Base) MCG/ACT inhaler Inhale 2 puffs into the lungs every 6 (six) hours as needed for wheezing or shortness of breath. 8 g 0   amLODipine (NORVASC) 10 MG tablet TAKE 1 TABLET EVERY DAY 90 tablet 1   amoxicillin (AMOXIL) 500 MG capsule Take 1 capsule (500 mg total) by mouth 3 (three) times daily. 15 capsule 0   aspirin-sod bicarb-citric acid (ALKA-SELTZER) 325 MG TBEF tablet Take 650 mg by mouth every 6 (six) hours as needed (indigestion).     Blood Glucose Monitoring Suppl (ACCU-CHEK AVIVA PLUS) w/Device KIT USE AS DIRECTED 1 kit 0   buPROPion (WELLBUTRIN  SR) 150 MG 12 hr tablet Take 1 tablet (150 mg total) by mouth 2 (two) times daily. 60 tablet 0   dorzolamide-timolol (COSOPT) 2-0.5 % ophthalmic solution Place 1 drop into both eyes 2 (two) times daily. 10 mL 3   dorzolamide-timolol (COSOPT) 22.3-6.8 MG/ML ophthalmic solution Place 1 drop into both eyes 2 (two) times daily.     DROPLET PEN NEEDLES 29G X MISC USE AS DIRECTED 100 each 3   gabapentin (NEURONTIN) 600 MG tablet Take 1 tablet (600 mg total) by mouth 3 (three) times daily. 270 tablet 2   gatifloxacin (ZYMAXID) 0.5 % SOLN Place 1 drop into the right eye 4 (four) times daily as directed 5 mL 1   hydrALAZINE (APRESOLINE) 25 MG tablet TAKE 1 AND 1/2 TABLETS THREE TIMES DAILY 405 tablet 0   HYDROcodone-acetaminophen (NORCO/VICODIN) 5-325 MG tablet Take 1 tablet by mouth every 4 (four) hours as needed for pain. 12 tablet 0   ibuprofen (ADVIL) 800 MG tablet Take 1 tablet (800 mg total) by mouth every 8 (eight) hours as needed for pain. 15 tablet 0   ketorolac (ACULAR) 0.5 % ophthalmic solution Place 1 drop into the right eye 4 (four) times daily as directed 10 mL 1   Lancets Misc. (ACCU-CHEK SOFTCLIX LANCET DEV) KIT Check blood sugars 3 times a day. 3 kit 6   latanoprost (XALATAN) 0.005 % ophthalmic solution Place 1 drop into both eyes at bedtime.     latanoprost (XALATAN) 0.005 % ophthalmic solution Place 1 drop into both eyes every evening at bedtime as directed 2.5 mL 6   levocetirizine (XYZAL) 5 MG tablet Take 1 tablet (5 mg total) by mouth every evening. 30 tablet 0   lidocaine (LIDODERM) 5 % PLACE 1 PATCH ONTO THE SKIN DAILY. REMOVE AND DISCARD PATCH WITHIN 12 HOURS OR AS DIRECTED BY MD 30 patch 1   losartan (COZAAR) 50 MG tablet TAKE 1 TABLET EVERY DAY 90 tablet 3   metFORMIN (GLUCOPHAGE) 500 MG tablet Take 1 tablet (500 mg total) by mouth daily with breakfast. 90  tablet 1   methocarbamol (ROBAXIN) 500 MG tablet Take 1 tablet (500 mg total) by mouth 2 (two) times daily as needed for  muscle spasms. 40 tablet 3   metoprolol tartrate (LOPRESSOR) 25 MG tablet TAKE 1/2 TABLET TWICE DAILY 90 tablet 1   montelukast (SINGULAIR) 10 MG tablet Take 1 tablet (10 mg total) by mouth at bedtime. 30 tablet 0   omeprazole (PRILOSEC) 20 MG capsule Take 1 capsule (20 mg total) by mouth daily. 90 capsule 0   prednisoLONE acetate (PRED FORTE) 1 % ophthalmic suspension Place 1 drop into the right eye 4 (four) times daily as directed 5 mL 1   rosuvastatin (CRESTOR) 20 MG tablet TAKE 1 TABLET EVERY DAY 90 tablet 0   solifenacin (VESICARE) 5 MG tablet TAKE 1 TABLET EVERY DAY 90 tablet 0   tirzepatide (MOUNJARO) 12.5 MG/0.5ML Pen Inject 12.5 mg into the skin once a week. 2 mL 0   traMADol (ULTRAM) 50 MG tablet Take 1 tablet (50 mg total) by mouth every 12 (twelve) hours as needed. 60 tablet 0   traZODone (DESYREL) 100 MG tablet Take 1 tablet (100 mg total) by mouth at bedtime as needed for sleep. 30 tablet 0   triamcinolone cream (KENALOG) 0.1 % Apply 1 Application topically 2 (two) times daily. 30 g 0   vitamin B-12 (CYANOCOBALAMIN) 500 MCG tablet Take 1 tablet (500 mcg total) by mouth daily.     No current facility-administered medications on file prior to visit.   Allergies  Allergen Reactions   Emend [Aprepitant] Other (See Comments)    Urticaria    Lisinopril Cough   Family History  Problem Relation Age of Onset   Hypertension Mother    Hyperlipidemia Mother    Stroke Mother    Thyroid disease Mother    Arthritis Mother    Hearing loss Mother    Hypertension Father    Diabetes Father    Hyperlipidemia Father    Sudden death Father    Arthritis Father    Heart disease Father    Obesity Father    Cancer Sister 33       fibrosarcoma (back); currently 68   Cancer Brother    Prostate cancer Maternal Uncle    Colon cancer Paternal Aunt        Dx 67s; deceased 19s   Prostate cancer Paternal Uncle        currently 40   Cancer Paternal Uncle 24       "bone" ; unk. primary    Stomach cancer Paternal Uncle    Hypercalcemia Neg Hx    PE: LMP  (LMP Unknown)  Wt Readings from Last 3 Encounters:  10/09/22 200 lb (90.7 kg)  09/25/22 200 lb 12.8 oz (91.1 kg)  09/17/22 206 lb (93.4 kg)   Constitutional: overweight, in NAD Eyes: EOMI, no exophthalmos ENT: no thyromegaly, no cervical lymphadenopathy Cardiovascular: RRR, No MRG Respiratory: CTA B Musculoskeletal: no deformities Skin: moist, warm, no rashes Neurological: no tremor with outstretched hands,  Assessment: 1. Hypercalcemia  2. Thyroid nodules   Plan: Patient has with history of elevated calcium in the highest level being at 12.2 but with PTH levels borderline between the lower limit of the target interval and suppressed.  At last visit, PTH was 16, for calcium of 12.2.  Vitamin D levels have been normal lately. -She has no apparent complications from hypercalcemia: No history of nephrolithiasis, osteoporosis, fractures.  Also, no abdominal pain, depression, bone pain.  She does  have a history of constipation. -We investigated her for primary hyperparathyroidism and the investigation was inconclusive.  She had a low 24-hour urine calcium, normal calcitriol, suppressed phosphate, normal magnesium, and normal vitamin A and PTH RP. -We checked a bone scan and this was negative for normal uptake -Of note, she has a history of elevated free light chains and a degree of renal insufficiency but her GFR is not low enough to explain such as significant hypercalcemia.  Skeletal survey showed no evidence of multiple myeloma or neoplastic disease of the bone in 06/2019.  A PET scan showed a 1.1 cm right lung nodule, which was read as possible pulmonary carcinoid.  Hypercalcemia can be a late paraneoplastic manifestation of lung cancer and carcinoid is rarely related to hypercalcemia.  Also, PTH RP is expected to be elevated in this case.  This was normal for her. -Therefore, as of now, despite the fact that she has  elevated high calcium, we are at an impasse regarding a clear diagnosis -We did discuss at last visit that she may need a PET/CT, however, I do not feel that this is quite such high heels for her since she did have 1 in the past, in 2021, and this did not show possible culprits for hypercalcemia -At this point, the working diagnosis can be elevated free light chains.  She was investigated by hematology but she was not considered to have multiple myeloma or MGUS.   -My suspicion for familial hypercalcemic hypocalciuria is not very high despite a low fractional excretion of calcium, since she did have normal calcium in the past and she only started to have hypercalcemia around 2020. -At today's visit, we will repeat her calcium and PTH along with a vitamin D.  At the next visit, I will also recheck her SPEP + IFE.  We also discussed that at next visit with PCP to see if she can have another imaging of her chest to follow the lung nodule. -I advised her to stay well-hydrated -I will see her back in 6 months  2.  Thyroid nodules -Patient has a history of multinodular goiter, and is status post left lobectomy + isthmusectomy in 2012 -The right lobe, she has 3 thyroid nodules, which appear to be benign per review of the latest thyroid ultrasound report from 10/2019.  One of the nodules are spongiform and the others appear more likely areas of inflammation. -No neck compression symptoms -TSH was normal at last visit. We will not repeat this today.  Carlus Pavlov, MD PhD [facility]

## 2022-10-21 NOTE — Patient Instructions (Signed)
Please stop at the lab.  Please discuss with your PCP about repeating chest imaging.  You should have an endocrinology follow-up appointment in 6 months.

## 2022-10-22 LAB — MULTIPLE MYELOMA PANEL, SERUM

## 2022-10-23 LAB — CALCIUM, IONIZED: Calcium, Ion: 6 mg/dL — ABNORMAL HIGH (ref 4.7–5.5)

## 2022-10-24 ENCOUNTER — Other Ambulatory Visit (INDEPENDENT_AMBULATORY_CARE_PROVIDER_SITE_OTHER): Payer: Self-pay | Admitting: Family Medicine

## 2022-10-24 DIAGNOSIS — F39 Unspecified mood [affective] disorder: Secondary | ICD-10-CM

## 2022-10-24 DIAGNOSIS — Z9109 Other allergy status, other than to drugs and biological substances: Secondary | ICD-10-CM

## 2022-10-30 ENCOUNTER — Encounter: Payer: Self-pay | Admitting: Internal Medicine

## 2022-10-30 ENCOUNTER — Ambulatory Visit (INDEPENDENT_AMBULATORY_CARE_PROVIDER_SITE_OTHER): Payer: Medicare PPO | Admitting: Family Medicine

## 2022-10-30 ENCOUNTER — Encounter (INDEPENDENT_AMBULATORY_CARE_PROVIDER_SITE_OTHER): Payer: Self-pay | Admitting: Family Medicine

## 2022-10-30 VITALS — BP 117/77 | HR 64 | Temp 97.5°F | Ht 65.0 in | Wt 203.0 lb

## 2022-10-30 DIAGNOSIS — E1169 Type 2 diabetes mellitus with other specified complication: Secondary | ICD-10-CM | POA: Diagnosis not present

## 2022-10-30 DIAGNOSIS — Z7985 Long-term (current) use of injectable non-insulin antidiabetic drugs: Secondary | ICD-10-CM | POA: Diagnosis not present

## 2022-10-30 DIAGNOSIS — Z6833 Body mass index (BMI) 33.0-33.9, adult: Secondary | ICD-10-CM | POA: Diagnosis not present

## 2022-10-30 DIAGNOSIS — F3289 Other specified depressive episodes: Secondary | ICD-10-CM

## 2022-10-30 DIAGNOSIS — Z6832 Body mass index (BMI) 32.0-32.9, adult: Secondary | ICD-10-CM

## 2022-10-30 DIAGNOSIS — E669 Obesity, unspecified: Secondary | ICD-10-CM

## 2022-10-30 DIAGNOSIS — F39 Unspecified mood [affective] disorder: Secondary | ICD-10-CM

## 2022-10-30 LAB — MULTIPLE MYELOMA PANEL, SERUM
Albumin/Glob SerPl: 1.3 (ref 0.7–1.7)
B-Globulin SerPl Elph-Mcnc: 1.2 g/dL (ref 0.7–1.3)
Gamma Glob SerPl Elph-Mcnc: 1.3 g/dL (ref 0.4–1.8)
Globulin, Total: 3.5 g/dL (ref 2.2–3.9)
IgM (Immunoglobulin M), Srm: 45 mg/dL (ref 26–217)
Total Protein: 7.7 g/dL (ref 6.0–8.5)

## 2022-10-30 LAB — PARATHYROID HORMONE, INTACT (NO CA): PTH: 29 pg/mL (ref 15–65)

## 2022-10-30 MED ORDER — TIRZEPATIDE 15 MG/0.5ML ~~LOC~~ SOAJ
15.0000 mg | SUBCUTANEOUS | 0 refills | Status: DC
Start: 2022-10-30 — End: 2022-11-20

## 2022-10-30 NOTE — Progress Notes (Signed)
Sarah [name], D.O.  ABFM, ABOM Specializing in Clinical Bariatric Medicine  Office located at: [address] [place], [place]  [address]     Assessment and Plan:   Medications Discontinued During This Encounter  Medication Reason   tirzepatide [facility]) 12.5 MG/0.5ML Pen Dose change     Meds ordered this encounter  Medications   tirzepatide (MOUNJARO) 15 MG/0.5ML Pen    Sig: Inject 15 mg into the skin once a week.    Dispense:  2 mL    Refill:  0     Type 2 diabetes mellitus with obesity (HCC) Assessment: Condition is not optimized. Lab Results  Component Value Date   HGBA1C 6.0 09/17/2022   HGBA1C 5.5 05/17/2022   HGBA1C 5.5 01/09/2022  - She has been compliant with Mounjaro 12.5 mg once weekly. Denies any adverse side effects. - She endorses that she feels hungry everyday  - She hit her protein goals 4 times in the last 3 weeks. On most days, she had 40-50 grams of protein.  - Endorses that her fasting blood sugar is generally in the 80s-90s at home. Reports that it dropped to the 70s on one occasion. She was asymptomatic. No concerns.  Plan: - Increase Mounjaro to 15 mg once weekly. Risk/benefits of increased Mounjaro dose were reviewed with pt. All questions were answered appropriately.  - Unless pre-existing renal or cardiopulmonary conditions exist which patient was told to limit their fluid intake by another provider, I recommended roughly one half of their weight in pounds, to be the approximate ounces of non-caloric, non-caffeinated beverages they should drink per day; including more if they are engaging in exercise.  - Continue to monitor blood sugars at home.  - Continue to decrease simple carbs/ sugars; increase fiber and proteins -> follow her meal plan.   - Sarah [name] will continue to work on weight loss, exercise, via their meal plan we devised to help decrease the risk of progressing to diabetes.     Depressed mood (HCC) with emotional  eating Assessment: Condition is stable.  - Denies any SI/HI. Mood is stable. - No issues with Wellbutrin SR 150 mg BID. Denies any side effects.  Plan:  - Continue with med. Denies need for refill.  - Continue her prudent nutritional plan that is low in simple carbohydrates, saturated fats and trans fats to goal of 5-10% weight loss to achieve significant health benefits.  Pt encouraged to continually advance exercise and cardiovascular fitness as tolerated throughout weight loss journey.  - We will continue to monitor closely alongside PCP / other specialists.     TREATMENT PLAN FOR OBESITY: BMI 32.0-32.9,adult-current bmi 33.78 Obesity with starting BMI 37.9/date 02/15/2020 Assessment:  Sarah [name] is here to discuss her progress with her obesity treatment plan along with follow-up of her obesity related diagnoses. See Medical Weight Management Flowsheet for complete bioelectrical impedance results.  Condition is not optimized. Biometric data collected today, was reviewed with patient.   Since last office visit on 10/09/22 patient's Fat mass has decreased by 0.4 lb. Muscle mass has increased by 3.2 lb. Total body water has decreased by 1.4 lb.  Counseling done on how various foods will affect these numbers and how to maximize success  Total lbs lost to date: 25 Total weight loss percentage to date: 10.96   Plan:  - Continue with the Category 1 meal plan with breakfast and lunch options and journaling 1000-1100 calories and 75+ grams of protein   -  I reviewed the Category 1 meal plan with pt and showed her how to reach her daily protein goals.    Behavioral Intervention Additional resources provided today:  food journal log Evidence-based interventions for health behavior change were utilized today including the discussion of self monitoring techniques, problem-solving barriers and SMART goal setting techniques.   Regarding patient's less desirable eating habits and patterns, we  employed the technique of small changes.  Pt will specifically work on: continue adherence to prudent nutritional plan for next visit.    Recommended Physical Activity Goals  Donny has been advised to slowly work up to 150 minutes of moderate intensity aerobic activity a week and strengthening exercises 2-3 times per week for cardiovascular health, weight loss maintenance and preservation of muscle mass.   She has agreed to Continue current level of physical activity   FOLLOW UP: Return in about 4 weeks (around 11/27/2022). She was informed of the importance of frequent follow up visits to maximize her success with intensive lifestyle modifications for her multiple health conditions.   Subjective:   Chief complaint: Obesity Sarah [name] is here to discuss her progress with her obesity treatment plan. She is on the the Category 1 Plan with breakfast and lunch options and journaling and states she is following her eating plan approximately 80% of the time. She states she is exercising (aerobics at gym) 50 minutes 3 days per week.  Interval History:  Sarah [name] is here for a follow up office visit.     Since last office visit:   - Her journaling log indicates that she is over on calories on most days and her protein was very low.  - She hit her protein goals 4 times in the last 3 weeks. On most days, she ate 40-50 grams of protein daily.  - Endorses that she feels hungry everyday. - Endorses eating out for her church function. Reports that this food was high in calories and low in protein.   Pharmacotherapy for weight loss: She is currently taking  Metformin and Mounjaro  for medical weight loss.  Denies side effects.    Review of Systems:  Pertinent positives were addressed with patient today.  Weight Summary and Biometrics   Weight Lost Since Last Visit: 0  Weight Gained Since Last Visit: 3 lb    Vitals Temp: (!) 97.5 F (36.4 C) BP: 117/77 Pulse Rate: 64 SpO2: 93  %   Anthropometric Measurements Height: 5\' 5"  (1.651 m) Weight: 203 lb (92.1 kg) BMI (Calculated): 33.78 Weight at Last Visit: 206 lb Weight Lost Since Last Visit: 0 Weight Gained Since Last Visit: 3 lb Starting Weight: 228 lb Total Weight Loss (lbs): 25 lb (11.3 kg) Peak Weight: 228 lb   Body Composition  Body Fat %: 39.4 % Fat Mass (lbs): 80 lbs Muscle Mass (lbs): 116.8 lbs Total Body Water (lbs): 75 lbs Visceral Fat Rating : 11   Other Clinical Data Fasting: No Labs: No Today's Visit #: 62 Starting Date: 02/15/20     Objective:   PHYSICAL EXAM: Blood pressure 117/77, pulse 64, temperature (!) 97.5 F (36.4 C), height 5\' 5"  (1.651 m), weight 203 lb (92.1 kg), SpO2 93 %. Body mass index is 33.78 kg/m.  General: Well Developed, well nourished, and in no acute distress.  HEENT: Normocephalic, atraumatic Skin: Warm and dry, cap RF less 2 sec, good turgor Chest:  Normal excursion, shape, no gross abn Respiratory: speaking in full sentences, no conversational dyspnea NeuroM-Sk: Ambulates w/o assistance, moves *  4 Psych: A and O *3, insight good, mood-full  DIAGNOSTIC DATA REVIEWED:  BMET    Component Value Date/Time   NA 141 06/21/2022 1616   NA 138 04/22/2016 0750   K 4.2 06/21/2022 1616   K 3.9 04/22/2016 0750   CL 102 06/21/2022 1616   CO2 18 (L) 06/21/2022 1616   CO2 26 04/22/2016 0750   GLUCOSE 102 (H) 06/21/2022 1616   GLUCOSE 81 03/28/2022 1508   GLUCOSE 142 (H) 04/22/2016 0750   BUN 20 06/21/2022 1616   BUN 19.1 11/20/2016 1355   CREATININE 1.22 (H) 06/21/2022 1616   CREATININE 1.26 (H) 12/29/2019 1015   CREATININE 1.1 11/20/2016 1355   CALCIUM 12.2 (H) 06/21/2022 1616   CALCIUM 9.8 04/22/2016 0750   GFRNONAA 47 (L) 03/28/2022 1508   GFRNONAA 48 (L) 12/29/2019 1015   GFRNONAA 63 07/08/2014 1212   GFRAA 69 04/17/2020 0928   GFRAA 56 (L) 12/29/2019 1015   GFRAA 73 07/08/2014 1212   Lab Results  Component Value Date   HGBA1C 6.0  09/17/2022   HGBA1C 10.1 (H) 07/07/2015   No results found for: "INSULIN" Lab Results  Component Value Date   TSH 1.370 06/21/2022   CBC    Component Value Date/Time   WBC 5.4 03/28/2022 1508   RBC 4.97 03/28/2022 1508   HGB 15.0 03/28/2022 1508   HGB 13.3 02/03/2020 0837   HGB 13.6 12/04/2018 0914   HGB 12.9 04/22/2016 0750   HCT 46.8 (H) 03/28/2022 1508   HCT 41.0 12/04/2018 0914   HCT 39.0 04/22/2016 0750   PLT 318 03/28/2022 1508   PLT 270 02/03/2020 0837   PLT 332 12/04/2018 0914   MCV 94.2 03/28/2022 1508   MCV 88 12/04/2018 0914   MCV 92.2 04/22/2016 0750   MCH 30.2 03/28/2022 1508   MCHC 32.1 03/28/2022 1508   RDW 13.6 03/28/2022 1508   RDW 13.6 12/04/2018 0914   RDW 13.9 04/22/2016 0750   Iron Studies    Component Value Date/Time   IRON 43 08/18/2010 0515   TIBC 389 08/18/2010 0515   FERRITIN 227 08/18/2010 0515   IRONPCTSAT 11 (L) 08/18/2010 0515   Lipid Panel     Component Value Date/Time   CHOL 167 01/09/2022 0855   TRIG 192 (H) 01/09/2022 0855   HDL 45 01/09/2022 0855   CHOLHDL 3.7 01/09/2022 0855   CHOLHDL 5.1 (H) 08/14/2016 1729   VLDL 74 (H) 08/14/2016 1729   LDLCALC 89 01/09/2022 0855   Hepatic Function Panel     Component Value Date/Time   PROT 7.7 10/21/2022 1216   PROT 8.2 04/22/2016 0750   ALBUMIN 4.6 01/09/2022 0855   ALBUMIN 3.8 04/22/2016 0750   AST 26 01/09/2022 0855   AST 29 12/29/2019 1015   AST 27 04/22/2016 0750   ALT 29 01/09/2022 0855   ALT 33 12/29/2019 1015   ALT 24 04/22/2016 0750   ALKPHOS 74 01/09/2022 0855   ALKPHOS 74 04/22/2016 0750   BILITOT 0.5 01/09/2022 0855   BILITOT 0.6 12/29/2019 1015   BILITOT 0.44 04/22/2016 0750   BILIDIR 0.2 08/18/2010 0515   IBILI 1.5 (H) 08/18/2010 0515      Component Value Date/Time   TSH 1.370 06/21/2022 1616   Nutritional Lab Results  Component Value Date   VD25OH 29.26 (L) 10/21/2022   VD25OH 31.2 06/21/2022   VD25OH 35.7 10/22/2021    Attestations:   Reviewed  by clinician on day of visit: allergies, medications, problem list, medical  history, surgical history, family history, social history, and previous encounter notes.   I,Special Puri,acting as a Neurosurgeon for [employer], DO.,have documented all relevant documentation on the behalf of Thomasene Lot, DO,as directed by  Thomasene Lot, DO while in the presence of Thomasene Lot, DO.   I, Thomasene Lot, DO, have reviewed all documentation for this visit. The documentation on 10/30/22 for the exam, diagnosis, procedures, and orders are all accurate and complete.

## 2022-10-31 ENCOUNTER — Other Ambulatory Visit: Payer: Self-pay | Admitting: Internal Medicine

## 2022-10-31 NOTE — Progress Notes (Signed)
Tech

## 2022-11-06 ENCOUNTER — Other Ambulatory Visit (INDEPENDENT_AMBULATORY_CARE_PROVIDER_SITE_OTHER): Payer: Self-pay | Admitting: Family Medicine

## 2022-11-06 ENCOUNTER — Other Ambulatory Visit: Payer: Self-pay | Admitting: Internal Medicine

## 2022-11-06 DIAGNOSIS — E785 Hyperlipidemia, unspecified: Secondary | ICD-10-CM

## 2022-11-06 DIAGNOSIS — K219 Gastro-esophageal reflux disease without esophagitis: Secondary | ICD-10-CM

## 2022-11-06 DIAGNOSIS — N3946 Mixed incontinence: Secondary | ICD-10-CM

## 2022-11-06 DIAGNOSIS — I10 Essential (primary) hypertension: Secondary | ICD-10-CM

## 2022-11-06 DIAGNOSIS — Z9109 Other allergy status, other than to drugs and biological substances: Secondary | ICD-10-CM

## 2022-11-06 DIAGNOSIS — F39 Unspecified mood [affective] disorder: Secondary | ICD-10-CM

## 2022-11-06 DIAGNOSIS — G8929 Other chronic pain: Secondary | ICD-10-CM

## 2022-11-06 DIAGNOSIS — E1169 Type 2 diabetes mellitus with other specified complication: Secondary | ICD-10-CM

## 2022-11-06 NOTE — Telephone Encounter (Signed)
LAST APPOINTMENT DATE: 10/30/2022 NEXT APPOINTMENT DATE: 11/20/2022   [facility] - [place], [place] - [address] [place] [address] Phone: [phone] Fax: [phone]  [facility] - [facility] [address] [address] Phone: [phone] Fax: [phone]  [facility] - [facility] [address] [place] [address] Phone: [phone] Fax: [phone]  Patient is requesting a refill of the following medications: Requested Prescriptions   Pending Prescriptions Disp Refills   levocetirizine (XYZAL) 5 MG tablet [Pharmacy Med Name: LEVOCETIRIZINE DIHYDROCHLORIDE 5 MG Tablet] 30 tablet 11    Sig: TAKE 1 TABLET EVERY EVENING   omeprazole (PRILOSEC) 20 MG capsule [Pharmacy Med Name: OMEPRAZOLE 20 MG Capsule Delayed Release] 30 capsule 11    Sig: TAKE 1 CAPSULE EVERY DAY   montelukast (SINGULAIR) 10 MG tablet [Pharmacy Med Name: MONTELUKAST SODIUM 10 MG Tablet] 30 tablet 11    Sig: TAKE 1 TABLET AT BEDTIME    Date last filled: 10/09/2022, 10/24/2022 Previously prescribed by Dr Sharee Holster  Lab Results  Component Value Date   HGBA1C 6.0 09/17/2022   HGBA1C 5.5 05/17/2022   HGBA1C 5.5 01/09/2022   Lab Results  Component Value Date   MICROALBUR 80 11/13/2016   LDLCALC 89 01/09/2022   CREATININE 1.22 (H) 06/21/2022   Lab Results  Component Value Date   VD25OH 29.26 (L) 10/21/2022   VD25OH 31.2 06/21/2022   VD25OH 35.7 10/22/2021    BP Readings from Last 3 Encounters:  10/30/22 117/77  10/21/22 130/80  10/09/22 102/68

## 2022-11-20 ENCOUNTER — Ambulatory Visit (INDEPENDENT_AMBULATORY_CARE_PROVIDER_SITE_OTHER): Payer: Medicare PPO | Admitting: Family Medicine

## 2022-11-20 ENCOUNTER — Encounter (INDEPENDENT_AMBULATORY_CARE_PROVIDER_SITE_OTHER): Payer: Self-pay | Admitting: Family Medicine

## 2022-11-20 VITALS — BP 123/81 | HR 68 | Temp 97.5°F | Ht 65.0 in | Wt 204.0 lb

## 2022-11-20 DIAGNOSIS — Z7985 Long-term (current) use of injectable non-insulin antidiabetic drugs: Secondary | ICD-10-CM

## 2022-11-20 DIAGNOSIS — Z6833 Body mass index (BMI) 33.0-33.9, adult: Secondary | ICD-10-CM | POA: Diagnosis not present

## 2022-11-20 DIAGNOSIS — E559 Vitamin D deficiency, unspecified: Secondary | ICD-10-CM

## 2022-11-20 DIAGNOSIS — E1169 Type 2 diabetes mellitus with other specified complication: Secondary | ICD-10-CM | POA: Diagnosis not present

## 2022-11-20 DIAGNOSIS — E669 Obesity, unspecified: Secondary | ICD-10-CM | POA: Diagnosis not present

## 2022-11-20 DIAGNOSIS — Z7984 Long term (current) use of oral hypoglycemic drugs: Secondary | ICD-10-CM

## 2022-11-20 DIAGNOSIS — Z6832 Body mass index (BMI) 32.0-32.9, adult: Secondary | ICD-10-CM

## 2022-11-20 MED ORDER — VITAMIN D3 25 MCG (1000 UT) PO CAPS
1000.0000 [IU] | ORAL_CAPSULE | Freq: Every day | ORAL | Status: AC
Start: 2022-11-20 — End: ?

## 2022-11-20 MED ORDER — TIRZEPATIDE 15 MG/0.5ML ~~LOC~~ SOAJ
15.0000 mg | SUBCUTANEOUS | 0 refills | Status: DC
Start: 2022-11-20 — End: 2022-12-26

## 2022-11-20 NOTE — Progress Notes (Signed)
[name], D.O.  ABFM, ABOM Specializing in Clinical Bariatric Medicine  Office located at: [address] [place], [place]  [address]     Assessment and Plan:   Medications Discontinued During This Encounter  Medication Reason   tirzepatide [facility]) 15 MG/0.5ML Pen Reorder     Meds ordered this encounter  Medications   tirzepatide (MOUNJARO) 15 MG/0.5ML Pen    Sig: Inject 15 mg into the skin once a week.    Dispense:  2 mL    Refill:  0    Refill when needed please for pt's DIABETES   Cholecalciferol (VITAMIN D3) 25 MCG (1000 UT) CAPS    Sig: Take 1 capsule (1,000 Units total) by mouth daily. Per Endo in 10/2022    Dispense:  60 capsule     Type 2 diabetes mellitus with obesity (HCC) Assessment: Condition is stable. Patent endorses that she was not able to increase her Mounjaro because the pharmacy did not have the 15 mg dose. She has been taking the 12.5 mg once weekly and has 4 more doses left. Pt has also been compliant and tolerant with Metformin 500 mg daily. Denies any GI side effects.  Lab Results  Component Value Date   HGBA1C 6.0 09/17/2022   HGBA1C 5.5 05/17/2022   HGBA1C 5.5 01/09/2022    Plan: Continue with both antidiabetic medications at current doses. Will reorder Mounjaro 15 mg once weekly per pt request. Patient has been instructed that she may start the Mounjaro 15 mg once she finishes the 12.5 mg dose.   Patient agrees to eat two meals a deal (lunch and dinner) -no skipping or going long periods without eating.  Will continue to monitor condition alongside PCP.    Vitamin D deficiency Assessment: Condition is not at goal. I reviewed Dr.Gerghe's notes from 10/21/22 with Marylene Land. She was worked up for primary hyperparathyroidism, which was inconclusive. However, her PTH was slightly elevated from prior and will be doing a parathyroid scan. Furthermore, patient had Vitamin D levels retested and it showed her levels are less than 30. Dr.Gerghe  recommended that Radiah begin an OTC Vitamin D 1,000 IU daily. Pt endorses that she has yet to obtain the supplement.  Lab Results  Component Value Date   VD25OH 29.26 (L) 10/21/2022   VD25OH 31.2 06/21/2022   VD25OH 35.7 10/22/2021   Plan: Begin Cholecalciferol 1,000 Units daily. Weight loss will likely improve availability of vitamin D, thus encouraged Ginette to continue with meal plan and their weight loss efforts to further improve this condition.  Thus, we will need to monitor levels regularly (every 3-4 mo on average) to keep levels within normal limits and prevent over supplementation.   TREATMENT PLAN FOR OBESITY: BMI 32.0-32.9,adult-current bmi 33.95 Obesity with starting BMI 37.9/date 02/15/2020 Assessment:  TENIQUA COFFMAN is here to discuss her progress with her obesity treatment plan along with follow-up of her obesity related diagnoses. See Medical Weight Management Flowsheet for complete bioelectrical impedance results.  Condition is  not Biometric data collected today, was reviewed with patient.   Since last office visit on 10/30/22 patient's  Muscle mass has increased by 0.2 lb. Fat mass has increased by 1.6 lb. Total body water has increased by 1 lb.  Counseling done on how various foods will affect these numbers and how to maximize success  Total lbs lost to date: 24  Total weight loss percentage to date: 10.53    Plan: Continue with the Category 1 meal  plan with breakfast and lunch options and journaling 1000-1100 calories and 75+ grams of protein.  Behavioral Intervention Additional resources provided today: patient declined Evidence-based interventions for health behavior change were utilized today including the discussion of self monitoring techniques, problem-solving barriers and SMART goal setting techniques.   Regarding patient's less desirable eating habits and patterns, we employed the technique of small changes.  Pt will specifically work on: eating two meals a  day (lunch and dinner) and using breakfast foods as snacks throughout the day for next visit.    Recommended Physical Activity Goals  Kailah has been advised to slowly work up to 150 minutes of moderate intensity aerobic activity a week and strengthening exercises 2-3 times per week for cardiovascular health, weight loss maintenance and preservation of muscle mass.   She has agreed to Continue current level of physical activity   FOLLOW UP: Return in about 15 days (around 12/05/2022). She was informed of the importance of frequent follow up visits to maximize her success with intensive lifestyle modifications for her multiple health conditions.   Subjective:   Chief complaint: Obesity Taralyn is here to discuss her progress with her obesity treatment plan. She is on the the Category 1 Plan and keeping a food journal and adhering to recommended goals of 1000-1100 calories and 75+  protein and states she is following her eating plan approximately 30-40% of the time. She states she is doing silver sneakers 50 minutes 3 days per week.  Interval History:  JULE WHITSEL is here for a follow up office visit.     Since last office visit:    - Hugh has been journaling her intake and here are some of her calorie and protein values on arbitrary days: 739 calories, 39 grams protein. 1331 calories, 63 grams protein. 408 calories and 21.9 grams protein.   - She was not able to obtain Mounjaro 15 mg once weekly. She has been taking Mounjaro 12 mg once weekly.  - She endorses that on someday's she still has strong carb cravings.   Pharmacotherapy for weight loss: She is currently taking  Metformin and Mounjaro  for medical weight loss.  Denies side effects.    Review of Systems:  Pertinent positives were addressed with patient today.  Reviewed by clinician on day of visit: allergies, medications, problem list, medical history, surgical history, family history, social history, and previous  encounter notes.  Weight Summary and Biometrics   Weight Lost Since Last Visit: 0  Weight Gained Since Last Visit: 1    Vitals Temp: (!) 97.5 F (36.4 C) BP: 123/81 Pulse Rate: 68 SpO2: 98 %   Anthropometric Measurements Height: 5\' 5"  (1.651 m) Weight: 204 lb (92.5 kg) BMI (Calculated): 33.95 Weight at Last Visit: 203lb Weight Lost Since Last Visit: 0 Weight Gained Since Last Visit: 1 Starting Weight: 228lb Total Weight Loss (lbs): 24 lb (10.9 kg) Peak Weight: 228lb   Body Composition  Body Fat %: 39.8 % Fat Mass (lbs): 81.6 lbs Muscle Mass (lbs): 117 lbs Total Body Water (lbs): 76 lbs Visceral Fat Rating : 11   Other Clinical Data Fasting: no Labs: no Today's Visit #: 63 Starting Date: 02/15/20   Objective:   PHYSICAL EXAM: Blood pressure 123/81, pulse 68, temperature (!) 97.5 F (36.4 C), height 5\' 5"  (1.651 m), weight 204 lb (92.5 kg), SpO2 98 %. Body mass index is 33.95 kg/m.  General: Well Developed, well nourished, and in no acute distress.  HEENT: Normocephalic, atraumatic Skin: Warm  and dry, cap RF less 2 sec, good turgor Chest:  Normal excursion, shape, no gross abn Respiratory: speaking in full sentences, no conversational dyspnea NeuroM-Sk: Ambulates w/o assistance, moves * 4 Psych: A and O *3, insight good, mood-full  DIAGNOSTIC DATA REVIEWED:  BMET    Component Value Date/Time   NA 141 06/21/2022 1616   NA 138 04/22/2016 0750   K 4.2 06/21/2022 1616   K 3.9 04/22/2016 0750   CL 102 06/21/2022 1616   CO2 18 (L) 06/21/2022 1616   CO2 26 04/22/2016 0750   GLUCOSE 102 (H) 06/21/2022 1616   GLUCOSE 81 03/28/2022 1508   GLUCOSE 142 (H) 04/22/2016 0750   BUN 20 06/21/2022 1616   BUN 19.1 11/20/2016 1355   CREATININE 1.22 (H) 06/21/2022 1616   CREATININE 1.26 (H) 12/29/2019 1015   CREATININE 1.1 11/20/2016 1355   CALCIUM 12.2 (H) 06/21/2022 1616   CALCIUM 9.8 04/22/2016 0750   GFRNONAA 47 (L) 03/28/2022 1508   GFRNONAA 48 (L)  12/29/2019 1015   GFRNONAA 63 07/08/2014 1212   GFRAA 69 04/17/2020 0928   GFRAA 56 (L) 12/29/2019 1015   GFRAA 73 07/08/2014 1212   Lab Results  Component Value Date   HGBA1C 6.0 09/17/2022   HGBA1C 10.1 (H) 07/07/2015   No results found for: "INSULIN" Lab Results  Component Value Date   TSH 1.370 06/21/2022   CBC    Component Value Date/Time   WBC 5.4 03/28/2022 1508   RBC 4.97 03/28/2022 1508   HGB 15.0 03/28/2022 1508   HGB 13.3 02/03/2020 0837   HGB 13.6 12/04/2018 0914   HGB 12.9 04/22/2016 0750   HCT 46.8 (H) 03/28/2022 1508   HCT 41.0 12/04/2018 0914   HCT 39.0 04/22/2016 0750   PLT 318 03/28/2022 1508   PLT 270 02/03/2020 0837   PLT 332 12/04/2018 0914   MCV 94.2 03/28/2022 1508   MCV 88 12/04/2018 0914   MCV 92.2 04/22/2016 0750   MCH 30.2 03/28/2022 1508   MCHC 32.1 03/28/2022 1508   RDW 13.6 03/28/2022 1508   RDW 13.6 12/04/2018 0914   RDW 13.9 04/22/2016 0750   Iron Studies    Component Value Date/Time   IRON 43 08/18/2010 0515   TIBC 389 08/18/2010 0515   FERRITIN 227 08/18/2010 0515   IRONPCTSAT 11 (L) 08/18/2010 0515   Lipid Panel     Component Value Date/Time   CHOL 167 01/09/2022 0855   TRIG 192 (H) 01/09/2022 0855   HDL 45 01/09/2022 0855   CHOLHDL 3.7 01/09/2022 0855   CHOLHDL 5.1 (H) 08/14/2016 1729   VLDL 74 (H) 08/14/2016 1729   LDLCALC 89 01/09/2022 0855   Hepatic Function Panel     Component Value Date/Time   PROT 7.7 10/21/2022 1216   PROT 8.2 04/22/2016 0750   ALBUMIN 4.6 01/09/2022 0855   ALBUMIN 3.8 04/22/2016 0750   AST 26 01/09/2022 0855   AST 29 12/29/2019 1015   AST 27 04/22/2016 0750   ALT 29 01/09/2022 0855   ALT 33 12/29/2019 1015   ALT 24 04/22/2016 0750   ALKPHOS 74 01/09/2022 0855   ALKPHOS 74 04/22/2016 0750   BILITOT 0.5 01/09/2022 0855   BILITOT 0.6 12/29/2019 1015   BILITOT 0.44 04/22/2016 0750   BILIDIR 0.2 08/18/2010 0515   IBILI 1.5 (H) 08/18/2010 0515      Component Value Date/Time   TSH  1.370 06/21/2022 1616   Nutritional Lab Results  Component Value Date   VD25OH  29.26 (L) 10/21/2022   VD25OH 31.2 06/21/2022   VD25OH 35.7 10/22/2021    Attestations:   I, Special Puri, acting as a Stage manager for [employer], DO., have compiled all relevant documentation for today's office visit on behalf of Thomasene Lot, DO, while in the presence of [employer], DO.  I have reviewed the above documentation for accuracy and completeness, and I agree with the above. [name], D.O.  The 21st Century Cures Act was signed into law in 2016 which includes the topic of electronic health records.  This provides immediate access to information in MyChart.  This includes consultation notes, operative notes, office notes, lab results and pathology reports.  If you have any questions about what you read please let us know at your next visit so we can discuss your concerns and take corrective action if need be.  We are right here with you.

## 2022-11-28 DIAGNOSIS — H2511 Age-related nuclear cataract, right eye: Secondary | ICD-10-CM | POA: Diagnosis not present

## 2022-11-29 ENCOUNTER — Other Ambulatory Visit: Payer: Self-pay

## 2022-11-29 ENCOUNTER — Other Ambulatory Visit (HOSPITAL_COMMUNITY): Payer: Self-pay

## 2022-11-29 DIAGNOSIS — H2512 Age-related nuclear cataract, left eye: Secondary | ICD-10-CM | POA: Diagnosis not present

## 2022-11-29 MED ORDER — GATIFLOXACIN 0.5 % OP SOLN
OPHTHALMIC | 1 refills | Status: DC
  Filled 2022-11-29: qty 5, 25d supply, fill #0

## 2022-11-29 MED ORDER — PREDNISOLONE ACETATE 1 % OP SUSP
OPHTHALMIC | 1 refills | Status: DC
  Filled 2022-11-29: qty 5, 25d supply, fill #0

## 2022-11-29 MED ORDER — KETOROLAC TROMETHAMINE 0.5 % OP SOLN
OPHTHALMIC | 1 refills | Status: DC
  Filled 2022-11-29: qty 10, 25d supply, fill #0

## 2022-12-05 ENCOUNTER — Encounter (INDEPENDENT_AMBULATORY_CARE_PROVIDER_SITE_OTHER): Payer: Self-pay | Admitting: Family Medicine

## 2022-12-05 ENCOUNTER — Ambulatory Visit (INDEPENDENT_AMBULATORY_CARE_PROVIDER_SITE_OTHER): Payer: Medicare PPO | Admitting: Family Medicine

## 2022-12-05 VITALS — BP 106/73 | HR 69 | Temp 97.6°F | Ht 65.0 in | Wt 203.0 lb

## 2022-12-05 DIAGNOSIS — E669 Obesity, unspecified: Secondary | ICD-10-CM

## 2022-12-05 DIAGNOSIS — E1169 Type 2 diabetes mellitus with other specified complication: Secondary | ICD-10-CM

## 2022-12-05 DIAGNOSIS — Z6833 Body mass index (BMI) 33.0-33.9, adult: Secondary | ICD-10-CM

## 2022-12-05 DIAGNOSIS — Z7984 Long term (current) use of oral hypoglycemic drugs: Secondary | ICD-10-CM | POA: Diagnosis not present

## 2022-12-05 DIAGNOSIS — E1159 Type 2 diabetes mellitus with other circulatory complications: Secondary | ICD-10-CM

## 2022-12-05 DIAGNOSIS — E559 Vitamin D deficiency, unspecified: Secondary | ICD-10-CM | POA: Diagnosis not present

## 2022-12-05 DIAGNOSIS — I152 Hypertension secondary to endocrine disorders: Secondary | ICD-10-CM | POA: Diagnosis not present

## 2022-12-05 DIAGNOSIS — Z7985 Long-term (current) use of injectable non-insulin antidiabetic drugs: Secondary | ICD-10-CM

## 2022-12-05 NOTE — Progress Notes (Signed)
Sarah [name], D.O.  ABFM, ABOM Specializing in Clinical Bariatric Medicine  Office located at: [address] [place], [place]  [address]     Assessment and Plan:    Type 2 diabetes mellitus with obesity (HCC) Assessment: Her diabetes mellitus is being treated with Mounjaro 12.5 weekly and Metformin 500 mg daily. Denies any G.I upset. Pt has 2-3 more doses of Mounjaro left. Her hunger and cravings are well controlled.   Lab Results  Component Value Date   HGBA1C 6.0 09/17/2022   HGBA1C 5.5 05/17/2022   HGBA1C 5.5 01/09/2022    Plan: Continue with both antidiabetic medications.Pt denies a need for refill.   Continue her prudent nutritional plan that is low in simple carbohydrates, saturated fats and trans fats to goal of 5-10% weight loss to achieve significant health benefits.  Pt encouraged to continually advance exercise and cardiovascular fitness as tolerated throughout weight loss journey. Will continue to monitor condition alongside PCP/ specialists.    Hypertension associated with diabetes (HCC) Assessment: Her blood pressure is stable today. Her HTN is being treated with Norvasc 10 mg daily, Cozaar 50 mg daily, and Lopressor 25 mg 1/2 tablet twice daily. Denies any adverse effects.   Last 3 blood pressure readings in our office are as follows: BP Readings from Last 3 Encounters:  12/05/22 106/73  11/20/22 123/81  10/30/22 117/77   Plan: Continue with all blood pressure medications at current doses. Continue with Prudent nutritional plan and low sodium diet, advance exercise as tolerated.    Ambulatory blood pressure monitoring encouraged.  Reminded patient that if they ever feel poorly in any way, to check their blood pressure and pulse as well. We will continue to monitor closely alongside PCP/ specialists.  Pt reminded to also f/up with those individuals as instructed by them.  We will continue to monitor symptoms as they relate to the her weight loss journey.     Vitamin D deficiency Assessment: We reviewed Dr.Gerghe's notes from regarding her Vitamin D levels from 10/21/22, which stated "Vitamin D is very slightly low- -please restart 1000 units vitamin D daily."   Lab Results  Component Value Date   VD25OH 29.26 (L) 10/21/2022   VD25OH 31.2 06/21/2022   VD25OH 35.7 10/22/2021   Plan: Encouraged pt to begin cholecalciferol 1,000 units daily.    Weight loss will likely improve availability of vitamin D, thus encouraged Sarah [name] to continue with meal plan and their weight loss efforts to further improve this condition.  Thus, we will need to monitor levels regularly (every 3-4 mo on average) to keep levels within normal limits and prevent over supplementation.  TREATMENT PLAN FOR OBESITY: BMI 33.0-33.9,adult- current bmi 33.78 Obesity with starting BMI 37.9/date 02/15/2020 Assessment: Sarah [name] is here to discuss her progress with her obesity treatment plan along with follow-up of her obesity related diagnoses. See Medical Weight Management Flowsheet for complete bioelectrical impedance results.  Condition is improving. Biometric data collected today, was reviewed with patient.   Since last office visit on 11/20/22 patient's  Muscle mass has increased by 0.4 lb. Fat mass has decreased by 2 lb. Total body water has decreased by 2.4 lb.  Counseling done on how various foods will affect these numbers and how to maximize success  Total lbs lost to date: 25  Total weight loss percentage to date: 10.96   Plan: Continue with the Category 1 meal plan with breakfast and lunch options and journaling 1000-1100 calories and 75+ grams of  protein.    Behavioral Intervention Additional resources provided today: patient declined Evidence-based interventions for health behavior change were utilized today including the discussion of self monitoring techniques, problem-solving barriers and SMART goal setting techniques.   Regarding patient's less desirable  eating habits and patterns, we employed the technique of small changes.  Pt will specifically work on: increasing her calories an  lean protein intake, going to the gym 4-5 days a wk, and caring for herself better for next visit.    Recommended Physical Activity Goals  Sarah [name] has been advised to slowly work up to 150 minutes of moderate intensity aerobic activity a week and strengthening exercises 2-3 times per week for cardiovascular health, weight loss maintenance and preservation of muscle mass.   She has agreed to Continue current level of physical activity   FOLLOW UP: Return in about 3 weeks (around 12/26/2022). She was informed of the importance of frequent follow up visits to maximize her success with intensive lifestyle modifications for her multiple health conditions.  Subjective:   Chief complaint: Obesity Sarah [name] is here to discuss her progress with her obesity treatment plan. She is on the the Category 1 Plan and keeping a food journal and adhering to recommended goals of 1000-1100 calories and 75+ protein and states she is following her eating plan approximately 70% of the time. She states she is not exercising.   Interval History:  Sarah [name] is here for a follow up office visit. Since last OV, Pt had cataract surgery and has been recovering well. Due to the surgery, she has not been exercising. Her journaling log indicates that she was low in protein. In the last 2-3 wks, her lowest protein was 12 grams and highest was 29 grams. Her lowest calories was 232 and highest was 1382. Pt endorses not being very focused on herself and her weight loss journey at the moment.  Pharmacotherapy Current Anti-obesity medications: Mounjaro. Reported side effects: none.  Review of Systems:  Pertinent positives were addressed with patient today.  Reviewed by clinician on day of visit: allergies, medications, problem list, medical history, surgical history, family history, social history,  and previous encounter notes.  Weight Summary and Biometrics   Weight Lost Since Last Visit: 1  Weight Gained Since Last Visit: 0lb    Vitals Temp: 97.6 F (36.4 C) BP: 106/73 Pulse Rate: 69 SpO2: 97 %   Anthropometric Measurements Height: 5\' 5"  (1.651 m) Weight: 203 lb (92.1 kg) BMI (Calculated): 33.78 Weight at Last Visit: 204lb Weight Lost Since Last Visit: 1 Weight Gained Since Last Visit: 0lb Starting Weight: 228lb Total Weight Loss (lbs): 25 lb (11.3 kg) Peak Weight: 228lb   Body Composition  Body Fat %: 39.2 % Fat Mass (lbs): 79.6 lbs Muscle Mass (lbs): 117.4 lbs Total Body Water (lbs): 73.6 lbs Visceral Fat Rating : 11   Other Clinical Data Fasting: no Labs: no Today's Visit #: 64 Starting Date: 02/15/20   Objective:   PHYSICAL EXAM: Blood pressure 106/73, pulse 69, temperature 97.6 F (36.4 C), height 5\' 5"  (1.651 m), weight 203 lb (92.1 kg), SpO2 97 %. Body mass index is 33.78 kg/m.  General: Well Developed, well nourished, and in no acute distress.  HEENT: Normocephalic, atraumatic Skin: Warm and dry, cap RF less 2 sec, good turgor Chest:  Normal excursion, shape, no gross abn Respiratory: speaking in full sentences, no conversational dyspnea NeuroM-Sk: Ambulates w/o assistance, moves * 4 Psych: A and O *3, insight good, mood-full  DIAGNOSTIC DATA REVIEWED:  BMET    Component Value Date/Time   NA 141 06/21/2022 1616   NA 138 04/22/2016 0750   K 4.2 06/21/2022 1616   K 3.9 04/22/2016 0750   CL 102 06/21/2022 1616   CO2 18 (L) 06/21/2022 1616   CO2 26 04/22/2016 0750   GLUCOSE 102 (H) 06/21/2022 1616   GLUCOSE 81 03/28/2022 1508   GLUCOSE 142 (H) 04/22/2016 0750   BUN 20 06/21/2022 1616   BUN 19.1 11/20/2016 1355   CREATININE 1.22 (H) 06/21/2022 1616   CREATININE 1.26 (H) 12/29/2019 1015   CREATININE 1.1 11/20/2016 1355   CALCIUM 12.2 (H) 06/21/2022 1616   CALCIUM 9.8 04/22/2016 0750   GFRNONAA 47 (L) 03/28/2022 1508    GFRNONAA 48 (L) 12/29/2019 1015   GFRNONAA 63 07/08/2014 1212   GFRAA 69 04/17/2020 0928   GFRAA 56 (L) 12/29/2019 1015   GFRAA 73 07/08/2014 1212   Lab Results  Component Value Date   HGBA1C 6.0 09/17/2022   HGBA1C 10.1 (H) 07/07/2015   No results found for: "INSULIN" Lab Results  Component Value Date   TSH 1.370 06/21/2022   CBC    Component Value Date/Time   WBC 5.4 03/28/2022 1508   RBC 4.97 03/28/2022 1508   HGB 15.0 03/28/2022 1508   HGB 13.3 02/03/2020 0837   HGB 13.6 12/04/2018 0914   HGB 12.9 04/22/2016 0750   HCT 46.8 (H) 03/28/2022 1508   HCT 41.0 12/04/2018 0914   HCT 39.0 04/22/2016 0750   PLT 318 03/28/2022 1508   PLT 270 02/03/2020 0837   PLT 332 12/04/2018 0914   MCV 94.2 03/28/2022 1508   MCV 88 12/04/2018 0914   MCV 92.2 04/22/2016 0750   MCH 30.2 03/28/2022 1508   MCHC 32.1 03/28/2022 1508   RDW 13.6 03/28/2022 1508   RDW 13.6 12/04/2018 0914   RDW 13.9 04/22/2016 0750   Iron Studies    Component Value Date/Time   IRON 43 08/18/2010 0515   TIBC 389 08/18/2010 0515   FERRITIN 227 08/18/2010 0515   IRONPCTSAT 11 (L) 08/18/2010 0515   Lipid Panel     Component Value Date/Time   CHOL 167 01/09/2022 0855   TRIG 192 (H) 01/09/2022 0855   HDL 45 01/09/2022 0855   CHOLHDL 3.7 01/09/2022 0855   CHOLHDL 5.1 (H) 08/14/2016 1729   VLDL 74 (H) 08/14/2016 1729   LDLCALC 89 01/09/2022 0855   Hepatic Function Panel     Component Value Date/Time   PROT 7.7 10/21/2022 1216   PROT 8.2 04/22/2016 0750   ALBUMIN 4.6 01/09/2022 0855   ALBUMIN 3.8 04/22/2016 0750   AST 26 01/09/2022 0855   AST 29 12/29/2019 1015   AST 27 04/22/2016 0750   ALT 29 01/09/2022 0855   ALT 33 12/29/2019 1015   ALT 24 04/22/2016 0750   ALKPHOS 74 01/09/2022 0855   ALKPHOS 74 04/22/2016 0750   BILITOT 0.5 01/09/2022 0855   BILITOT 0.6 12/29/2019 1015   BILITOT 0.44 04/22/2016 0750   BILIDIR 0.2 08/18/2010 0515   IBILI 1.5 (H) 08/18/2010 0515      Component Value  Date/Time   TSH 1.370 06/21/2022 1616   Nutritional Lab Results  Component Value Date   VD25OH 29.26 (L) 10/21/2022   VD25OH 31.2 06/21/2022   VD25OH 35.7 10/22/2021    Attestations:   I, Special Puri, acting as a Stage manager for Thomasene Lot, DO., have compiled all relevant documentation for today's office visit on behalf of Thomasene Lot,  DO, while in the presence of Thomasene Lot, DO.  I have reviewed the above documentation for accuracy and completeness, and I agree with the above. Sarah [name], D.O.  The 21st Century Cures Act was signed into law in 2016 which includes the topic of electronic health records.  This provides immediate access to information in MyChart.  This includes consultation notes, operative notes, office notes, lab results and pathology reports.  If you have any questions about what you read please let us know at your next visit so we can discuss your concerns and take corrective action if need be.  We are right here with you.

## 2022-12-06 ENCOUNTER — Encounter: Payer: Self-pay | Admitting: Internal Medicine

## 2022-12-09 ENCOUNTER — Other Ambulatory Visit: Payer: Self-pay | Admitting: Internal Medicine

## 2022-12-09 DIAGNOSIS — R911 Solitary pulmonary nodule: Secondary | ICD-10-CM

## 2022-12-11 ENCOUNTER — Inpatient Hospital Stay (HOSPITAL_COMMUNITY): Admission: RE | Admit: 2022-12-11 | Payer: Medicare PPO | Source: Ambulatory Visit

## 2022-12-11 ENCOUNTER — Encounter (HOSPITAL_COMMUNITY): Admission: RE | Admit: 2022-12-11 | Payer: Medicare PPO | Source: Ambulatory Visit

## 2022-12-19 DIAGNOSIS — H2512 Age-related nuclear cataract, left eye: Secondary | ICD-10-CM | POA: Diagnosis not present

## 2022-12-26 ENCOUNTER — Ambulatory Visit (INDEPENDENT_AMBULATORY_CARE_PROVIDER_SITE_OTHER): Payer: Medicare PPO | Admitting: Family Medicine

## 2022-12-26 ENCOUNTER — Encounter (INDEPENDENT_AMBULATORY_CARE_PROVIDER_SITE_OTHER): Payer: Self-pay | Admitting: Family Medicine

## 2022-12-26 VITALS — BP 123/80 | HR 72 | Temp 97.6°F | Ht 65.0 in | Wt 206.0 lb

## 2022-12-26 DIAGNOSIS — Z7984 Long term (current) use of oral hypoglycemic drugs: Secondary | ICD-10-CM

## 2022-12-26 DIAGNOSIS — F3289 Other specified depressive episodes: Secondary | ICD-10-CM

## 2022-12-26 DIAGNOSIS — Z6834 Body mass index (BMI) 34.0-34.9, adult: Secondary | ICD-10-CM | POA: Diagnosis not present

## 2022-12-26 DIAGNOSIS — F39 Unspecified mood [affective] disorder: Secondary | ICD-10-CM

## 2022-12-26 DIAGNOSIS — E1169 Type 2 diabetes mellitus with other specified complication: Secondary | ICD-10-CM | POA: Diagnosis not present

## 2022-12-26 DIAGNOSIS — E669 Obesity, unspecified: Secondary | ICD-10-CM | POA: Diagnosis not present

## 2022-12-26 DIAGNOSIS — K219 Gastro-esophageal reflux disease without esophagitis: Secondary | ICD-10-CM | POA: Diagnosis not present

## 2022-12-26 MED ORDER — TIRZEPATIDE 12.5 MG/0.5ML ~~LOC~~ SOAJ: 12.5000 mg | SUBCUTANEOUS | 0 refills | Status: DC

## 2022-12-26 MED ORDER — OMEPRAZOLE 20 MG PO CPDR
20.0000 mg | DELAYED_RELEASE_CAPSULE | Freq: Every day | ORAL | 0 refills | Status: DC
Start: 2022-12-26 — End: 2023-03-26

## 2022-12-26 NOTE — Progress Notes (Signed)
[name], D.O.  ABFM, ABOM Specializing in Clinical Bariatric Medicine  Office located at: [address] [place], [place]  [address]     Assessment and Plan:   Medications Discontinued During This Encounter  Medication Reason   amoxicillin (AMOXIL) 500 MG capsule    gatifloxacin (ZYMAXID) 0.5 % SOLN    gatifloxacin (ZYMAXID) 0.5 % SOLN    ibuprofen (ADVIL) 800 MG tablet    ketorolac (ACULAR) 0.5 % ophthalmic solution    ketorolac (ACULAR) 0.5 % ophthalmic solution    prednisoLONE acetate (PRED FORTE) 1 % ophthalmic suspension    prednisoLONE acetate (PRED FORTE) 1 % ophthalmic suspension    tirzepatide (MOUNJARO) 15 MG/0.5ML Pen    latanoprost (XALATAN) 0.005 % ophthalmic solution    omeprazole (PRILOSEC) 20 MG capsule Reorder     Meds ordered this encounter  Medications   omeprazole (PRILOSEC) 20 MG capsule    Sig: Take 1 capsule (20 mg total) by mouth daily.    Dispense:  90 capsule    Refill:  0   tirzepatide (MOUNJARO) 12.5 MG/0.5ML Pen    Sig: Inject 12.5 mg into the skin once a week.    Dispense:  2 mL    Refill:  0     Type 2 diabetes mellitus with obesity (HCC) Assessment & Plan: Condition is begin treated with Metformin 500 mg once daily and Mounjaro 12.5 mg once weekly- tolerating both medications well, denies any GI upset. Endorses having one more dose of her Mounjaro left and not being able to obtain the 15 mg dose due to national drug shortage. No complaints with hunger and cravings today.  Lab Results  Component Value Date   HGBA1C 6.0 09/17/2022   HGBA1C 5.5 05/17/2022   HGBA1C 5.5 01/09/2022    We prefer Railey to take Mounjaro 15 mg once weekly and she has been instructed to begin this increased dose when obtained. Until then, pt advised to maintain with the 12.5 mg dose. Will refill Mounjaro today. Continue her prudent nutritional plan that is low in simple carbohydrates, saturated fats and trans fats to goal of 5-10% weight loss to  achieve significant health benefits.  Pt encouraged to continually advance exercise and cardiovascular fitness as tolerated throughout weight loss journey. Will continue to monitor condition alongside PCP/specialists as it relates to their weight loss journey   Depressed mood (HCC) with emotional eating Assessment & Plan:  Denies any SI/HI. Endorses having some anxiety and depression since last OV concerning her weight. For her mood, she is on Wellbutrin SR 150 mg twice daily and denies any adverse effects.  Continue with Wellbutrin at current dose. Behavior modification techniques were discussed today to help deal with anxiety/depression including but not limited to exercise and self care activities like adequate sleep (7-9 hrs/nite).Reminded patient of the importance of following their prudent nutrition plan and how food can affect mood as well to support emotional wellbeing. We will continue to monitor closely alongside PCP / other specialists.    Gastroesophageal reflux disease, unspecified whether esophagitis present Assessment & Plan: Kamalei endorses that her GERD symptoms are pretty well controlled. She does note, however, sometimes walking up in the morning and  having a metallic taste in her mouth. She takes Prilosec 20 mg daily and is tolerating medication well.   Will refill Prilosec today. Lifestyle modifications are the first line treatment for this issue. Some of these include not eating within 2-3 hours of lying down, avoiding trigger foods,  decrease caffeine intake, and decrease abdominal girth. ABBEYGAIL IGOE will continue to work on diet, exercise and weight loss efforts.  Orders and follow up as documented in medical records.    TREATMENT PLAN FOR OBESITY: BMI 33.0-33.9,adult- current bmi 33.78 Obesity with starting BMI 37.9/date 02/15/2020 Assessment: AIKO BELKO is here to discuss her progress with her obesity treatment plan along with follow-up of her obesity related  diagnoses. See Medical Weight Management Flowsheet for complete bioelectrical impedance results.  Condition is not optimized. Biometric data collected today, was reviewed with patient.   Since last office visit on 12/05/22 patient's Muscle mass has decreased by 0.4 lb. Fat mass has increased by 3.8 lb. Total body water has increased by 1.8 lb.  Counseling done on how various foods will affect these numbers and how to maximize success  Total lbs lost to date: - 25 lbs Total weight loss percentage to date: 10.96%   Plan: Continue with the Category 1 meal plan with breakfast and lunch options and journaling 1000-1100 calories and 75+ grams of protein.    Behavioral Intervention Additional resources provided today: patient declined Evidence-based interventions for health behavior change were utilized today including the discussion of self monitoring techniques, problem-solving barriers and SMART goal setting techniques.   Regarding patient's less desirable eating habits and patterns, we employed the technique of small changes.  Pt will specifically work on: doing some form of exercise 3 days a wk for next visit.    Recommended Physical Activity Goals  Emely has been advised to slowly work up to 150 minutes of moderate intensity aerobic activity a week and strengthening exercises 2-3 times per week for cardiovascular health, weight loss maintenance and preservation of muscle mass.   She has agreed to Continue current level of physical activity   FOLLOW UP: Return in about 2 weeks (around 01/09/2023). She was informed of the importance of frequent follow up visits to maximize her success with intensive lifestyle modifications for her multiple health conditions.  Subjective:   Chief complaint: Obesity Kensley is here to discuss her progress with her obesity treatment plan. She is on the Category 1 Plan and keeping a food journal and adhering to recommended goals of 1000-1100 calories and 75+  protein and states she is following her eating plan approximately 50% of the time. She states she is not exercising because of her cataract surgery.  Interval History:  MARGIA WIESEN is here for a follow up office visit. Since last OV, Alis has been doing okay. Reports working more and having less time to focus on her self. Also, she notes that her financial situation has been difficult. She has not been sleeping well - sleeps 2-3 hrs a day. Eating 3 meals a day. For breakfast, typically has bread and 2 eggs. For dinner, she eats a lot of chicken and or fish. She started OTC Vitamin D 1,000 units daily as recommended by Dr.Gerghe.   Pharmacotherapy for weight loss: She is currently taking  Wellbutrin SR, Metformin, and Mounjaro  for medical weight loss.  Denies side effects.    Review of Systems:  Pertinent positives were addressed with patient today.  Reviewed by clinician on day of visit: allergies, medications, problem list, medical history, surgical history, family history, social history, and previous encounter notes.  Weight Summary and Biometrics   Weight Lost Since Last Visit: 0lb  Weight Gained Since Last Visit: 3lb    Vitals Temp: 97.6 F (36.4 C) BP: 123/80 Pulse Rate: 72  SpO2: 95 %   Anthropometric Measurements Height: 5\' 5"  (1.651 m) Weight: 206 lb (93.4 kg) BMI (Calculated): 34.28 Weight at Last Visit: 203;b Weight Lost Since Last Visit: 0lb Weight Gained Since Last Visit: 3lb Starting Weight: 228lb Total Weight Loss (lbs): 22 lb (9.979 kg) Peak Weight: 228lb   Body Composition  Body Fat %: 40.4 % Fat Mass (lbs): 83.4 lbs Muscle Mass (lbs): 117 lbs Total Body Water (lbs): 75.4 lbs Visceral Fat Rating : 11   Other Clinical Data Fasting: no Labs: no Today's Visit #: 65 Starting Date: 02/15/20   Objective:   PHYSICAL EXAM: Blood pressure 123/80, pulse 72, temperature 97.6 F (36.4 C), height 5\' 5"  (1.651 m), weight 206 lb (93.4 kg), SpO2  95%. Body mass index is 34.28 kg/m.  General: Well Developed, well nourished, and in no acute distress.  HEENT: Normocephalic, atraumatic Skin: Warm and dry, cap RF less 2 sec, good turgor Chest:  Normal excursion, shape, no gross abn Respiratory: speaking in full sentences, no conversational dyspnea NeuroM-Sk: Ambulates w/o assistance, moves * 4 Psych: A and O *3, insight good, mood-full  DIAGNOSTIC DATA REVIEWED:  BMET    Component Value Date/Time   NA 141 06/21/2022 1616   NA 138 04/22/2016 0750   K 4.2 06/21/2022 1616   K 3.9 04/22/2016 0750   CL 102 06/21/2022 1616   CO2 18 (L) 06/21/2022 1616   CO2 26 04/22/2016 0750   GLUCOSE 102 (H) 06/21/2022 1616   GLUCOSE 81 03/28/2022 1508   GLUCOSE 142 (H) 04/22/2016 0750   BUN 20 06/21/2022 1616   BUN 19.1 11/20/2016 1355   CREATININE 1.22 (H) 06/21/2022 1616   CREATININE 1.26 (H) 12/29/2019 1015   CREATININE 1.1 11/20/2016 1355   CALCIUM 12.2 (H) 06/21/2022 1616   CALCIUM 9.8 04/22/2016 0750   GFRNONAA 47 (L) 03/28/2022 1508   GFRNONAA 48 (L) 12/29/2019 1015   GFRNONAA 63 07/08/2014 1212   GFRAA 69 04/17/2020 0928   GFRAA 56 (L) 12/29/2019 1015   GFRAA 73 07/08/2014 1212   Lab Results  Component Value Date   HGBA1C 6.0 09/17/2022   HGBA1C 10.1 (H) 07/07/2015   No results found for: "INSULIN" Lab Results  Component Value Date   TSH 1.370 06/21/2022   CBC    Component Value Date/Time   WBC 5.4 03/28/2022 1508   RBC 4.97 03/28/2022 1508   HGB 15.0 03/28/2022 1508   HGB 13.3 02/03/2020 0837   HGB 13.6 12/04/2018 0914   HGB 12.9 04/22/2016 0750   HCT 46.8 (H) 03/28/2022 1508   HCT 41.0 12/04/2018 0914   HCT 39.0 04/22/2016 0750   PLT 318 03/28/2022 1508   PLT 270 02/03/2020 0837   PLT 332 12/04/2018 0914   MCV 94.2 03/28/2022 1508   MCV 88 12/04/2018 0914   MCV 92.2 04/22/2016 0750   MCH 30.2 03/28/2022 1508   MCHC 32.1 03/28/2022 1508   RDW 13.6 03/28/2022 1508   RDW 13.6 12/04/2018 0914   RDW 13.9  04/22/2016 0750   Iron Studies    Component Value Date/Time   IRON 43 08/18/2010 0515   TIBC 389 08/18/2010 0515   FERRITIN 227 08/18/2010 0515   IRONPCTSAT 11 (L) 08/18/2010 0515   Lipid Panel     Component Value Date/Time   CHOL 167 01/09/2022 0855   TRIG 192 (H) 01/09/2022 0855   HDL 45 01/09/2022 0855   CHOLHDL 3.7 01/09/2022 0855   CHOLHDL 5.1 (H) 08/14/2016 1729   VLDL 74 (  H) 08/14/2016 1729   LDLCALC 89 01/09/2022 0855   Hepatic Function Panel     Component Value Date/Time   PROT 7.7 10/21/2022 1216   PROT 8.2 04/22/2016 0750   ALBUMIN 4.6 01/09/2022 0855   ALBUMIN 3.8 04/22/2016 0750   AST 26 01/09/2022 0855   AST 29 12/29/2019 1015   AST 27 04/22/2016 0750   ALT 29 01/09/2022 0855   ALT 33 12/29/2019 1015   ALT 24 04/22/2016 0750   ALKPHOS 74 01/09/2022 0855   ALKPHOS 74 04/22/2016 0750   BILITOT 0.5 01/09/2022 0855   BILITOT 0.6 12/29/2019 1015   BILITOT 0.44 04/22/2016 0750   BILIDIR 0.2 08/18/2010 0515   IBILI 1.5 (H) 08/18/2010 0515      Component Value Date/Time   TSH 1.370 06/21/2022 1616   Nutritional Lab Results  Component Value Date   VD25OH 29.26 (L) 10/21/2022   VD25OH 31.2 06/21/2022   VD25OH 35.7 10/22/2021    Attestations:   I, Special Puri , acting as a Stage manager for Thomasene Lot, DO., have compiled all relevant documentation for today's office visit on behalf of Thomasene Lot, DO, while in the presence of [employer], DO.  I have reviewed the above documentation for accuracy and completeness, and I agree with the above. [name], D.O.  The 21st Century Cures Act was signed into law in 2016 which includes the topic of electronic health records.  This provides immediate access to information in MyChart.  This includes consultation notes, operative notes, office notes, lab results and pathology reports.  If you have any questions about what you read please let us know at your next visit so we can discuss your  concerns and take corrective action if need be.  We are right here with you.

## 2023-01-03 LAB — HM DIABETES EYE EXAM

## 2023-01-08 ENCOUNTER — Encounter: Payer: Self-pay | Admitting: Emergency Medicine

## 2023-01-08 ENCOUNTER — Other Ambulatory Visit (INDEPENDENT_AMBULATORY_CARE_PROVIDER_SITE_OTHER): Payer: Self-pay | Admitting: Family Medicine

## 2023-01-08 ENCOUNTER — Ambulatory Visit: Payer: Medicare PPO | Admitting: Emergency Medicine

## 2023-01-08 VITALS — BP 110/78 | HR 56 | Temp 97.9°F | Ht 65.0 in | Wt 211.0 lb

## 2023-01-08 DIAGNOSIS — F39 Unspecified mood [affective] disorder: Secondary | ICD-10-CM

## 2023-01-08 DIAGNOSIS — R911 Solitary pulmonary nodule: Secondary | ICD-10-CM | POA: Diagnosis not present

## 2023-01-08 DIAGNOSIS — Z9109 Other allergy status, other than to drugs and biological substances: Secondary | ICD-10-CM

## 2023-01-08 NOTE — Patient Instructions (Addendum)
We reviewed your prior CT scans today. We will repeat a CT scan of your chest now to follow a small right lower lobe pulmonary nodule Depending on your CT scan and other evaluation we may decide to pursue biopsy or other testing on this nodule Follow Dr. Delton Coombes next available after your CT scan of the chest so we can review the results and plan next steps

## 2023-01-08 NOTE — Progress Notes (Signed)
Subjective:    Patient ID: Sarah [name], female    DOB: July 08, 1965, 57 y.o.   MRN: [id number]  HPI 57 year old never smoker with a history of diabetes, ovarian cancer with peritoneal carcinomatosis (2016, TAH/BSO + chemo), hypertension, glaucoma, GERD. She is here today to discuss pulmonary nodule.  She had a CT scan of the chest 07/11/2020 to follow an 11 mm anterior right lower lobe nodule that may have been present dating back to 2016 with slow increase in size.  It was negative on a prior dotatate PET scan done on 11/2019.  Carcinoid was questioned. She has been experiencing hypercalcemia, followed by Dr Elvera Lennox. 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 She works out at [employer] without difficulty. She has a cough - often at random. She clears her throat. She has allergies and GERD.    Review of Systems As per HPI  Past Medical History:  Diagnosis Date   Arthritis    Diabetes mellitus without complication (HCC)    Family history of adverse reaction to anesthesia    sister-n/v   Family history of colon cancer    GERD (gastroesophageal reflux disease)    Glaucoma 07/2014   Glucagonoma 07/28/2014   Pt denies this but states she has glaucoma   History of chemotherapy    History of hiatal hernia    Hypercalcemia    Hypertension    Imbalance    Neuropathy    feet bilat   Nocturia    Peritoneal carcinomatosis (HCC)    carcinoma of ovary    Shortness of breath dyspnea    hx of 2013 - no problems currently    Sleep apnea    Thyroid nodule    history of    Wears glasses      Family History  Problem Relation Age of Onset   Hypertension Mother    Hyperlipidemia Mother    Stroke Mother    Thyroid disease Mother    Arthritis Mother    Hearing loss Mother    Hypertension Father    Diabetes Father    Hyperlipidemia Father    Sudden death Father    Arthritis Father    Heart disease Father    Obesity Father    Cancer Sister 5       fibrosarcoma (back); currently 56    Cancer Brother    Prostate cancer Maternal Uncle    Colon cancer Paternal Aunt        Dx 72s; deceased 88s   Prostate cancer Paternal Uncle        currently 54   Cancer Paternal Uncle 59       "bone" ; unk. primary   Stomach cancer Paternal Uncle    Hypercalcemia Neg Hx      Social History   Socioeconomic History   Marital status: Married    Spouse name: Not on file   Number of children: 2   Years of education: Not on file   Highest education level: 12th grade  Occupational History   Occupation: disability  Tobacco Use   Smoking status: Never   Smokeless tobacco: Never  Vaping Use   Vaping status: Never Used  Substance and Sexual Activity   Alcohol use: No   Drug use: No   Sexual activity: Not Currently    Birth control/protection: None  Other Topics Concern   Not on file  Social History Narrative   No issues with transportation.    Attends church  Social Determinants of Health   Financial Resource Strain: Low Risk  (09/16/2022)   Overall Financial Resource Strain (CARDIA)    Difficulty of Paying Living Expenses: Not very hard  Food Insecurity: No Food Insecurity (09/16/2022)   Hunger Vital Sign    Worried About Running Out of Food in the Last Year: Never true    Ran Out of Food in the Last Year: Never true  Transportation Needs: No Transportation Needs (09/16/2022)   PRAPARE - Administrator, Civil Service (Medical): No    Lack of Transportation (Non-Medical): No  Physical Activity: Sufficiently Active (09/16/2022)   Exercise Vital Sign    Days of Exercise per Week: 3 days    Minutes of Exercise per Session: 50 min  Stress: No Stress Concern Present (09/16/2022)   [employer] of Occupational Health - Occupational Stress Questionnaire    Feeling of Stress : Only a little  Social Connections: Moderately Integrated (09/16/2022)   Social Connection and Isolation Panel [NHANES]    Frequency of Communication with Friends and Family: More than three times a  week    Frequency of Social Gatherings with Friends and Family: Twice a week    Attends Religious Services: More than 4 times per year    Active Member of [employer] or Organizations: Yes    Attends Engineer, structural: More than 4 times per year    Marital Status: Separated  Recent Concern: Social Connections - Moderately Isolated (06/28/2022)   Social Connection and Isolation Panel [NHANES]    Frequency of Communication with Friends and Family: More than three times a week    Frequency of Social Gatherings with Friends and Family: More than three times a week    Attends Religious Services: More than 4 times per year    Active Member of [employer] or Organizations: No    Attends Banker Meetings: Never    Marital Status: Separated  Intimate Partner Violence: Not At Risk (06/28/2022)   Humiliation, Afraid, Rape, and Kick questionnaire    Fear of Current or Ex-Partner: No    Emotionally Abused: No    Physically Abused: No    Sexually Abused: No     Allergies  Allergen Reactions   Emend [Aprepitant] Other (See Comments)    Urticaria    Lisinopril Cough     Outpatient Medications Prior to Visit  Medication Sig Dispense Refill   ACCU-CHEK AVIVA PLUS test strip TEST BLOOD SUGAR AS DIRECTED 300 strip 3   Accu-Chek Softclix Lancets lancets TEST BLOOD SUGAR THREE TIMES DAILY 300 each 3   albuterol (VENTOLIN HFA) 108 (90 Base) MCG/ACT inhaler Inhale 2 puffs into the lungs every 6 (six) hours as needed for wheezing or shortness of breath. 8 g 0   amLODipine (NORVASC) 10 MG tablet TAKE 1 TABLET EVERY DAY 90 tablet 0   Blood Glucose Monitoring Suppl (ACCU-CHEK AVIVA PLUS) w/Device KIT USE AS DIRECTED 1 kit 0   Cholecalciferol (VITAMIN D3) 25 MCG (1000 UT) CAPS Take 1 capsule (1,000 Units total) by mouth daily. Per Endo in 10/2022 60 capsule    dorzolamide-timolol (COSOPT) 2-0.5 % ophthalmic solution Place 1 drop into both eyes 2 (two) times daily. 10 mL 3   DROPLET PEN NEEDLES  29G X MISC USE AS DIRECTED 100 each 3   gabapentin (NEURONTIN) 600 MG tablet Take 1 tablet (600 mg total) by mouth 3 (three) times daily. 270 tablet 2   hydrALAZINE (APRESOLINE) 25 MG tablet TAKE 1  AND 1/2 TABLETS THREE TIMES DAILY 405 tablet 0   HYDROcodone-acetaminophen (NORCO/VICODIN) 5-325 MG tablet Take 1 tablet by mouth every 4 (four) hours as needed for pain. 12 tablet 0   Lancets Misc. (ACCU-CHEK SOFTCLIX LANCET DEV) KIT Check blood sugars 3 times a day. 3 kit 6   latanoprost (XALATAN) 0.005 % ophthalmic solution Place 1 drop into both eyes every evening at bedtime as directed 2.5 mL 6   levocetirizine (XYZAL) 5 MG tablet TAKE 1 TABLET EVERY EVENING 90 tablet 0   lidocaine (LIDODERM) 5 % PLACE 1 PATCH ONTO THE SKIN DAILY. REMOVE AND DISCARD PATCH WITHIN 12 HOURS OR AS DIRECTED BY MD 30 patch 2   losartan (COZAAR) 50 MG tablet TAKE 1 TABLET EVERY DAY 90 tablet 3   metFORMIN (GLUCOPHAGE) 500 MG tablet TAKE 1 TABLET EVERY DAY WITH BREAKFAST 90 tablet 0   methocarbamol (ROBAXIN) 500 MG tablet Take 1 tablet (500 mg total) by mouth 2 (two) times daily as needed for muscle spasms. 40 tablet 3   metoprolol tartrate (LOPRESSOR) 25 MG tablet TAKE 1/2 TABLET TWICE DAILY 90 tablet 0   montelukast (SINGULAIR) 10 MG tablet TAKE 1 TABLET AT BEDTIME 90 tablet 0   omeprazole (PRILOSEC) 20 MG capsule Take 1 capsule (20 mg total) by mouth daily. 90 capsule 0   rosuvastatin (CRESTOR) 20 MG tablet TAKE 1 TABLET EVERY DAY 90 tablet 0   solifenacin (VESICARE) 5 MG tablet TAKE 1 TABLET EVERY DAY 90 tablet 0   tirzepatide (MOUNJARO) 12.5 MG/0.5ML Pen Inject 12.5 mg into the skin once a week. 2 mL 0   traMADol (ULTRAM) 50 MG tablet Take 1 tablet (50 mg total) by mouth every 12 (twelve) hours as needed. 60 tablet 0   traZODone (DESYREL) 100 MG tablet Take 1 tablet (100 mg total) by mouth at bedtime as needed for sleep. 30 tablet 0   triamcinolone cream (KENALOG) 0.1 % Apply 1 Application topically 2 (two) times  daily. 30 g 0   vitamin B-12 (CYANOCOBALAMIN) 500 MCG tablet Take 1 tablet (500 mcg total) by mouth daily.     aspirin-sod bicarb-citric acid (ALKA-SELTZER) 325 MG TBEF tablet Take 650 mg by mouth every 6 (six) hours as needed (indigestion).     dorzolamide-timolol (COSOPT) 22.3-6.8 MG/ML ophthalmic solution Place 1 drop into both eyes 2 (two) times daily.     buPROPion (WELLBUTRIN SR) 150 MG 12 hr tablet TAKE 1 TABLET TWICE DAILY 180 tablet 0   No facility-administered medications prior to visit.         Objective:   Physical Exam Vitals:   01/08/23 1048  BP: 110/78  Pulse: (!) 56  Temp: 97.9 F (36.6 C)  TempSrc: Oral  SpO2: 97%  Weight: 211 lb (95.7 kg)  Height: 5\' 5"  (1.651 m)   Gen: Pleasant, well-nourished, in no distress,  normal affect  ENT: No lesions,  mouth clear,  oropharynx clear, no postnasal drip  Neck: No JVD, no stridor  Lungs: No use of accessory muscles, no crackles or wheezing on normal respiration, no wheeze on forced expiration  Cardiovascular: RRR, heart sounds normal, no murmur or gallops, no peripheral edema  Musculoskeletal: No deformities, no cyanosis or clubbing  Neuro: alert, awake, non focal  Skin: Warm, no lesions or ras       Assessment & Plan:  Pulmonary nodule 1 cm or greater in diameter Isolated rounded pulmonary nodule in the right lower lobe.  It has slowly enlarged in size going back to  2016, was negative on a dotatate PET scan.  I suspect that this is a carcinoid.  Unclear to me whether carcinoids can be related to hypercalcemia, I will have to do some literature research on this.  The most appropriate step at this point is to repeat her CT chest, look for interval change.  We can decide whether to pursue bronchoscopy for tissue diagnosis, possibly even consider primary resection depending on CT results.  Discussed these options with her.  We will follow-up after the CT is done and plan next steps.   Levy Pupa, MD,  PhD 01/08/2023, 11:16 AM [facility] [phone] or if no answer before 7:00PM call [phone] For any issues after 7:00PM please call eLink [phone]

## 2023-01-08 NOTE — Assessment & Plan Note (Signed)
Isolated rounded pulmonary nodule in the right lower lobe.  It has slowly enlarged in size going back to 2016, was negative on a dotatate PET scan.  I suspect that this is a carcinoid.  Unclear to me whether carcinoids can be related to hypercalcemia, I will have to do some literature research on this.  The most appropriate step at this point is to repeat her CT chest, look for interval change.  We can decide whether to pursue bronchoscopy for tissue diagnosis, possibly even consider primary resection depending on CT results.  Discussed these options with her.  We will follow-up after the CT is done and plan next steps.

## 2023-01-09 ENCOUNTER — Other Ambulatory Visit (INDEPENDENT_AMBULATORY_CARE_PROVIDER_SITE_OTHER): Payer: Self-pay | Admitting: Family Medicine

## 2023-01-09 ENCOUNTER — Encounter (INDEPENDENT_AMBULATORY_CARE_PROVIDER_SITE_OTHER): Payer: Self-pay | Admitting: Family Medicine

## 2023-01-16 ENCOUNTER — Other Ambulatory Visit (HOSPITAL_COMMUNITY): Payer: Self-pay

## 2023-01-16 ENCOUNTER — Ambulatory Visit (INDEPENDENT_AMBULATORY_CARE_PROVIDER_SITE_OTHER): Payer: Medicare PPO | Admitting: Family Medicine

## 2023-01-16 ENCOUNTER — Encounter (INDEPENDENT_AMBULATORY_CARE_PROVIDER_SITE_OTHER): Payer: Self-pay | Admitting: Family Medicine

## 2023-01-16 VITALS — BP 132/80 | HR 84 | Temp 97.2°F | Ht 65.0 in | Wt 203.0 lb

## 2023-01-16 DIAGNOSIS — E1169 Type 2 diabetes mellitus with other specified complication: Secondary | ICD-10-CM

## 2023-01-16 DIAGNOSIS — C569 Malignant neoplasm of unspecified ovary: Secondary | ICD-10-CM

## 2023-01-16 DIAGNOSIS — K219 Gastro-esophageal reflux disease without esophagitis: Secondary | ICD-10-CM

## 2023-01-16 DIAGNOSIS — R911 Solitary pulmonary nodule: Secondary | ICD-10-CM

## 2023-01-16 DIAGNOSIS — E669 Obesity, unspecified: Secondary | ICD-10-CM | POA: Diagnosis not present

## 2023-01-16 DIAGNOSIS — F39 Unspecified mood [affective] disorder: Secondary | ICD-10-CM

## 2023-01-16 DIAGNOSIS — Z6833 Body mass index (BMI) 33.0-33.9, adult: Secondary | ICD-10-CM | POA: Diagnosis not present

## 2023-01-16 DIAGNOSIS — Z7984 Long term (current) use of oral hypoglycemic drugs: Secondary | ICD-10-CM

## 2023-01-16 DIAGNOSIS — Z7985 Long-term (current) use of injectable non-insulin antidiabetic drugs: Secondary | ICD-10-CM | POA: Diagnosis not present

## 2023-01-16 DIAGNOSIS — Z6834 Body mass index (BMI) 34.0-34.9, adult: Secondary | ICD-10-CM

## 2023-01-16 MED ORDER — BUPROPION HCL ER (SR) 200 MG PO TB12
200.0000 mg | ORAL_TABLET | Freq: Two times a day (BID) | ORAL | 0 refills | Status: DC
Start: 2023-01-16 — End: 2023-02-19

## 2023-01-16 MED ORDER — TIRZEPATIDE 12.5 MG/0.5ML ~~LOC~~ SOAJ
12.5000 mg | SUBCUTANEOUS | 0 refills | Status: DC
  Filled 2023-01-16: qty 2, 28d supply, fill #0

## 2023-01-16 NOTE — Progress Notes (Signed)
[name], D.O.  ABFM, ABOM Specializing in Clinical Bariatric Medicine  Office located at: [address] [place], [place]  [address]     Assessment and Plan:   Medications Discontinued During This Encounter  Medication Reason   tirzepatide [facility]) 12.5 MG/0.5ML Pen Reorder    Meds ordered this encounter  Medications   tirzepatide (MOUNJARO) 12.5 MG/0.5ML Pen    Sig: Inject 12.5 mg into the skin once a week.    Dispense:  2 mL    Refill:  0   buPROPion (WELLBUTRIN SR) 200 MG 12 hr tablet    Sig: Take 1 tablet (200 mg total) by mouth 2 (two) times daily.    Dispense:  60 tablet    Refill:  0    Type 2 diabetes mellitus with obesity (HCC) Assessment: Condition is Not at goal.. Pt has been compliant with Metformin daily and Mounjaro weekly. She denies any GI upset, N/V/D, and any adverse side effects. She informed me that she has been skipping meals recently and gets hungry at night.  Lab Results  Component Value Date   HGBA1C 6.0 09/17/2022   HGBA1C 5.5 05/17/2022   HGBA1C 5.5 01/09/2022    Plan: - Continue with Metformin 500mg  daily and Mounjaro 12.5mg  weekly. I will refill today.     - Intensive lifestyle modification including diet, exercise and weight loss are the first line of treatment for diabetes. We extensively discussed the importance of decreasing simple carbs, increasing proteins and how certain foods they eat will affect their blood sugars  - Hypoglycemia prevention discussed with the patient.  Eat on a regular basis- no skipping or going long periods without eating.      - Recheck labs in 3 months if not done at Endo provider / PCP.    Depressed mood (HCC) with emotional eating Assessment: Condition is Not optimized.. Denies any SI/HI. Pt is complaint with Wellbutrin.  Cravings and hunger are not well controlled. Pt informed me that she has been depressed lately and has been skipping meals due to depression triggered by recent health  complications, the times when she craves food is typically at night now. She informed me that she does not sleep well or long although she does get about 8hrs of sleep. She denies taking trazodone and does not have a specific reason on why she doesn't take it.   Plan: - I will increase her Wellbutrin to 200mg  twice daily.   - Discussed how thoughts affect eating habits, modeling of thoughts, feelings, and behaviors, and strategies for change.  Importance of not skipping meals and getting all her proteins and fiber in on a daily basis discussed.    - Discussed cognitive distortions, coping thoughts, and how to change our thoughts/ self talk regarding foods/ eating patterns.  - Importance of following up with PCP and others was stressed   - Reminded patient of the importance of following their prudent nutrition plan and how food can affect mood as well to support emotional wellbeing.    Pulmonary nodule- slowly inc in size Malignant neoplasm of ovary, unspecified laterality (HCC) Assessment: Condition is Not optimized. She informed me that her pulmonologist informed her that the 11mm nodule she has has grown which is stressing her out knowing her hx of ovarian cancer. She saw the pulmonologist on 01/08/2023 that noticed pulmonary nodule in the right lower lobe has enlarged in size going back to 2016. He ordered a repeat CT scan for further evaluation and  then will decide whether to pursue a bronchoscopy for tissue diagnosis.   Plan: - Follow up with pulmonology for repeat CT scan in the near future and discuss stress techniques to help her cope with these findings.    TREATMENT PLAN FOR OBESITY: BMI 34.0-34.9,adult- current BMI 33.78 Obesity with starting BMI 37.9/date 02/15/2020 Assessment:  Sarah [name] is here to discuss her progress with her obesity treatment plan along with follow-up of her obesity related diagnoses. See Medical Weight Management Flowsheet for complete bioelectrical  impedance results.  Condition is docourse: improving ut not optimized. Biometric data collected today, was reviewed with patient.   Since last office visit on 12/26/2022 patient's  Muscle mass has decreased by 1.4lb. Fat mass has decreased by 1.6lb. Total body water has decreased by 2.2lb.  Counseling done on how various foods will affect these numbers and how to maximize success  Total lbs lost to date: 25lbs Total weight loss percentage to date: 10.96%   Plan: - Adhere to  Category 1 meal plan with breakfast and lunch options and eating EVERYTHING on plan and journaling 1000-1100 calories and 75+ grams of protein.    Behavioral Intervention Additional resources provided today: patient declined Evidence-based interventions for health behavior change were utilized today including the discussion of self monitoring techniques, problem-solving barriers and SMART goal setting techniques.   Regarding patient's less desirable eating habits and patterns, we employed the technique of small changes.  Pt will specifically work on: Eating everything on plan and not skipping meals during the day for next visit.    Recommended Physical Activity Goals  Dannelle has been advised to slowly work up to 150 minutes of moderate intensity aerobic activity a week and strengthening exercises 2-3 times per week for cardiovascular health, weight loss maintenance and preservation of muscle mass.   She has agreed to Continue current level of physical activity    FOLLOW UP: No follow-ups on file.  She was informed of the importance of frequent follow up visits to maximize her success with intensive lifestyle modifications for her multiple health conditions.  Subjective:   Chief complaint: Obesity Zachariah is here to discuss her progress with her obesity treatment plan. She is on the the Category 1 Plan and keeping a food journal and adhering to recommended goals of 1000-1100 calories and 75+ protein and states she is  following her eating plan approximately 70% of the time. She states she is going to the gym and doing exercise classes 50 minutes 4 days per week.  Interval History:  Sarah [name] is here for a follow up office visit. Since last OV she has been ok but informs me that she believes she has been emotionally eating at night but does not eat during the day. Pt has had a stressful couple of weeks due to a pending cancer diagnosis which is triggering depression episodes, some days she has no will to get out of the bed.    Pharmacotherapy for weight loss: She is currently taking  Wellbutrin SR, Metformin, and Mounjaro   for medical weight loss.  Denies side effects.    Review of Systems:  Pertinent positives were addressed with patient today.  Reviewed by clinician on day of visit: allergies, medications, problem list, medical history, surgical history, family history, social history, and previous encounter notes.  Weight Summary and Biometrics   Weight Lost Since Last Visit: 3lb  Weight Gained Since Last Visit: 0lb   Vitals Temp: (!) 97.2 F (36.2 C) BP:  132/80 Pulse Rate: 84 SpO2: 98 %   Anthropometric Measurements Height: 5\' 5"  (1.651 m) Weight: 203 lb (92.1 kg) BMI (Calculated): 33.78 Weight at Last Visit: 206lb Weight Lost Since Last Visit: 3lb Weight Gained Since Last Visit: 0lb Starting Weight: 228lb Total Weight Loss (lbs): 25 lb (11.3 kg) Peak Weight: 228lb   Body Composition  Body Fat %: 40.2 % Fat Mass (lbs): 81.8 lbs Muscle Mass (lbs): 115.6 lbs Total Body Water (lbs): 73.2 lbs Visceral Fat Rating : 11   Other Clinical Data Fasting: no Labs: no Today's Visit #: 66 Starting Date: 02/15/20     Objective:   PHYSICAL EXAM: Blood pressure 132/80, pulse 84, temperature (!) 97.2 F (36.2 C), height 5\' 5"  (1.651 m), weight 203 lb (92.1 kg), SpO2 98%. Body mass index is 33.78 kg/m.  General: Well Developed, well nourished, and in no acute distress.   HEENT: Normocephalic, atraumatic Skin: Warm and dry, cap RF less 2 sec, good turgor Chest:  Normal excursion, shape, no gross abn Respiratory: speaking in full sentences, no conversational dyspnea NeuroM-Sk: Ambulates w/o assistance, moves * 4 Psych: A and O *3, insight good, mood-full  DIAGNOSTIC DATA REVIEWED:  BMET    Component Value Date/Time   NA 141 06/21/2022 1616   NA 138 04/22/2016 0750   K 4.2 06/21/2022 1616   K 3.9 04/22/2016 0750   CL 102 06/21/2022 1616   CO2 18 (L) 06/21/2022 1616   CO2 26 04/22/2016 0750   GLUCOSE 102 (H) 06/21/2022 1616   GLUCOSE 81 03/28/2022 1508   GLUCOSE 142 (H) 04/22/2016 0750   BUN 20 06/21/2022 1616   BUN 19.1 11/20/2016 1355   CREATININE 1.22 (H) 06/21/2022 1616   CREATININE 1.26 (H) 12/29/2019 1015   CREATININE 1.1 11/20/2016 1355   CALCIUM 12.2 (H) 06/21/2022 1616   CALCIUM 9.8 04/22/2016 0750   GFRNONAA 47 (L) 03/28/2022 1508   GFRNONAA 48 (L) 12/29/2019 1015   GFRNONAA 63 07/08/2014 1212   GFRAA 69 04/17/2020 0928   GFRAA 56 (L) 12/29/2019 1015   GFRAA 73 07/08/2014 1212   Lab Results  Component Value Date   HGBA1C 6.0 09/17/2022   HGBA1C 10.1 (H) 07/07/2015   No results found for: "INSULIN" Lab Results  Component Value Date   TSH 1.370 06/21/2022   CBC    Component Value Date/Time   WBC 5.4 03/28/2022 1508   RBC 4.97 03/28/2022 1508   HGB 15.0 03/28/2022 1508   HGB 13.3 02/03/2020 0837   HGB 13.6 12/04/2018 0914   HGB 12.9 04/22/2016 0750   HCT 46.8 (H) 03/28/2022 1508   HCT 41.0 12/04/2018 0914   HCT 39.0 04/22/2016 0750   PLT 318 03/28/2022 1508   PLT 270 02/03/2020 0837   PLT 332 12/04/2018 0914   MCV 94.2 03/28/2022 1508   MCV 88 12/04/2018 0914   MCV 92.2 04/22/2016 0750   MCH 30.2 03/28/2022 1508   MCHC 32.1 03/28/2022 1508   RDW 13.6 03/28/2022 1508   RDW 13.6 12/04/2018 0914   RDW 13.9 04/22/2016 0750   Iron Studies    Component Value Date/Time   IRON 43 08/18/2010 0515   TIBC 389  08/18/2010 0515   FERRITIN 227 08/18/2010 0515   IRONPCTSAT 11 (L) 08/18/2010 0515   Lipid Panel     Component Value Date/Time   CHOL 167 01/09/2022 0855   TRIG 192 (H) 01/09/2022 0855   HDL 45 01/09/2022 0855   CHOLHDL 3.7 01/09/2022 0855   CHOLHDL 5.1 (  H) 08/14/2016 1729   VLDL 74 (H) 08/14/2016 1729   LDLCALC 89 01/09/2022 0855   Hepatic Function Panel     Component Value Date/Time   PROT 7.7 10/21/2022 1216   PROT 8.2 04/22/2016 0750   ALBUMIN 4.6 01/09/2022 0855   ALBUMIN 3.8 04/22/2016 0750   AST 26 01/09/2022 0855   AST 29 12/29/2019 1015   AST 27 04/22/2016 0750   ALT 29 01/09/2022 0855   ALT 33 12/29/2019 1015   ALT 24 04/22/2016 0750   ALKPHOS 74 01/09/2022 0855   ALKPHOS 74 04/22/2016 0750   BILITOT 0.5 01/09/2022 0855   BILITOT 0.6 12/29/2019 1015   BILITOT 0.44 04/22/2016 0750   BILIDIR 0.2 08/18/2010 0515   IBILI 1.5 (H) 08/18/2010 0515      Component Value Date/Time   TSH 1.370 06/21/2022 1616   Nutritional Lab Results  Component Value Date   VD25OH 29.26 (L) 10/21/2022   VD25OH 31.2 06/21/2022   VD25OH 35.7 10/22/2021    Attestations:   This encounter took 40 total minutes of time including any pre-visit and post-visit time spent on this date of service, including taking a thorough history, reviewing any labs and/or imaging, reviewing prior notes, as well as documenting in the electronic health record on the date of service. Over 50% of that time was in direct face-to-face counseling and coordinating care for the patient today  I, Clinical biochemist, acting as a Stage manager for [employer], DO., have compiled all relevant documentation for today's office visit on behalf of Thomasene Lot, DO, while in the presence of [employer], DO.  I have reviewed the above documentation for accuracy and completeness, and I agree with the above. [name], D.O.  The 21st Century Cures Act was signed into law in 2016 which includes the topic  of electronic health records.  This provides immediate access to information in MyChart.  This includes consultation notes, operative notes, office notes, lab results and pathology reports.  If you have any questions about what you read please let us know at your next visit so we can discuss your concerns and take corrective action if need be.  We are right here with you.

## 2023-01-20 ENCOUNTER — Ambulatory Visit: Payer: Medicare PPO | Attending: Internal Medicine | Admitting: Internal Medicine

## 2023-01-20 ENCOUNTER — Encounter (INDEPENDENT_AMBULATORY_CARE_PROVIDER_SITE_OTHER): Payer: Self-pay | Admitting: Family Medicine

## 2023-01-20 ENCOUNTER — Encounter: Payer: Self-pay | Admitting: Internal Medicine

## 2023-01-20 VITALS — BP 128/79 | HR 68 | Temp 98.0°F | Resp 16 | Wt 211.0 lb

## 2023-01-20 DIAGNOSIS — E669 Obesity, unspecified: Secondary | ICD-10-CM

## 2023-01-20 DIAGNOSIS — N3946 Mixed incontinence: Secondary | ICD-10-CM | POA: Diagnosis not present

## 2023-01-20 DIAGNOSIS — R911 Solitary pulmonary nodule: Secondary | ICD-10-CM

## 2023-01-20 DIAGNOSIS — N1831 Chronic kidney disease, stage 3a: Secondary | ICD-10-CM | POA: Diagnosis not present

## 2023-01-20 DIAGNOSIS — Z9109 Other allergy status, other than to drugs and biological substances: Secondary | ICD-10-CM | POA: Diagnosis not present

## 2023-01-20 DIAGNOSIS — Z7984 Long term (current) use of oral hypoglycemic drugs: Secondary | ICD-10-CM

## 2023-01-20 DIAGNOSIS — E785 Hyperlipidemia, unspecified: Secondary | ICD-10-CM | POA: Diagnosis not present

## 2023-01-20 DIAGNOSIS — I152 Hypertension secondary to endocrine disorders: Secondary | ICD-10-CM | POA: Diagnosis not present

## 2023-01-20 DIAGNOSIS — E1169 Type 2 diabetes mellitus with other specified complication: Secondary | ICD-10-CM

## 2023-01-20 DIAGNOSIS — E1159 Type 2 diabetes mellitus with other circulatory complications: Secondary | ICD-10-CM

## 2023-01-20 LAB — POCT GLYCOSYLATED HEMOGLOBIN (HGB A1C): HbA1c, POC (prediabetic range): 6.3 % (ref 5.7–6.4)

## 2023-01-20 MED ORDER — GABAPENTIN 600 MG PO TABS
600.0000 mg | ORAL_TABLET | Freq: Three times a day (TID) | ORAL | 2 refills | Status: DC
Start: 2023-01-20 — End: 2024-01-07

## 2023-01-20 MED ORDER — HYDRALAZINE HCL 25 MG PO TABS
ORAL_TABLET | ORAL | 3 refills | Status: DC
Start: 2023-01-20 — End: 2023-09-22

## 2023-01-20 MED ORDER — ROSUVASTATIN CALCIUM 20 MG PO TABS
20.0000 mg | ORAL_TABLET | Freq: Every day | ORAL | 3 refills | Status: DC
Start: 2023-01-20 — End: 2023-11-10

## 2023-01-20 MED ORDER — SOLIFENACIN SUCCINATE 5 MG PO TABS
5.0000 mg | ORAL_TABLET | Freq: Every day | ORAL | 3 refills | Status: DC
Start: 2023-01-20 — End: 2023-11-10

## 2023-01-20 MED ORDER — MONTELUKAST SODIUM 10 MG PO TABS
10.0000 mg | ORAL_TABLET | Freq: Every day | ORAL | 3 refills | Status: DC
Start: 2023-01-20 — End: 2024-01-20

## 2023-01-20 MED ORDER — METFORMIN HCL 500 MG PO TABS
500.0000 mg | ORAL_TABLET | Freq: Every day | ORAL | 3 refills | Status: DC
Start: 2023-01-20 — End: 2023-11-10

## 2023-01-20 MED ORDER — ACCU-CHEK SOFTCLIX LANCETS MISC
3 refills | Status: DC
Start: 2023-01-20 — End: 2023-03-03

## 2023-01-20 MED ORDER — AMLODIPINE BESYLATE 10 MG PO TABS
10.0000 mg | ORAL_TABLET | Freq: Every day | ORAL | 3 refills | Status: DC
Start: 2023-01-20 — End: 2023-11-10

## 2023-01-20 MED ORDER — METOPROLOL TARTRATE 25 MG PO TABS
ORAL_TABLET | ORAL | 3 refills | Status: DC
Start: 2023-01-20 — End: 2023-09-22

## 2023-01-20 NOTE — Progress Notes (Signed)
F/u DM and HTN

## 2023-01-20 NOTE — Progress Notes (Signed)
Patient ID: Sarah [name], female    DOB: Feb 02, 1966  MRN: [id number]  CC: Diabetes and Hypertension   Subjective: Sarah [name] is a 57 y.o. 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 (S/p adjuvant chemotherapy completed on 12/08/14. S/p ex lap, hernia repair and small bowel resection for a presumed recurrence (benign pathology), Hypercalcemia with elevated free light chains (BM bx Dr. Mosetta Putt negative, no need for further f/u), dx with [facility] by Dr. Everardo All, 1.1 cm RLL nodule (bx negative 01/2020), RT thyroid nodule (does not met criteria for bx 10/2019),   DM: Results for orders placed or performed in visit on 01/20/23  HgB A1c  Result Value Ref Range   Hemoglobin A1C     HbA1c POC (<> result, manual entry)     HbA1c, POC (prediabetic range) 6.3 5.7 - 6.4 %   HbA1c, POC (controlled diabetic range)     *Note: Due to a large number of results and/or encounters for the requested time period, some results have not been displayed. A complete set of results can be found in Results Review.  On Mounjario 12.5 mg once a wk and Metformin 500 mg daily Still plugged in with [facility]WM.  Wgh up 5 lbs since last visit. Reports appetite down; still goes to gym now 4x/wk Had cataract extractions with Dr. Fredderick Phenix since last visit  HTN/aortic atherosclerosis: Reports compliance with taking amlodipine 10 mg daily, hydralazine 25 mg 1-1/2 tablets 3 times a day, Cozaar 50 mg daily, metoprolol 25 mg half tablet twice a day and Crestor.  -SOB sometimes when signing in the church choir.   CKD: last GFR 52.  Not on NSAIDs.  HyperCA:  saw Dr. Wyonia Hough and B[place] in f/u.  PTH still within normal range but higher than before.  Dr. Renford Dills has ordered parathyroid scan. Dr. Solon Augusta concern with possible  carcinoid given nodule in the right lung.  He has ordered follow-up CT.  States that decision will be made based on results of the CT of whether to do bronchoscopy or removal altogether.  I reminded patient that she had the biopsy done on this nodule in August 2021.  Dermatitis/discoloration of abdomen: was called by derm but did not go.  Rash has significantly decreased in size with Triamcinolone cream..   Request refill on Singulair for allergies and Vesicare for urinary incontinence.  Patient Active Problem List   Diagnosis Date Noted   Class 1 obesity with serious comorbidity and body mass index (BMI) of 32.0 to 32.9 in adult 08/28/2022   Gastroesophageal reflux disease 08/28/2022   with current bmi 32.5 08/28/2022   Opioid use agreement exists 07/21/2022   Degenerative disc disease, thoracic 07/21/2022   Degenerative disc disease, lumbar 07/21/2022   Essential hypertension 07/10/2022   BMI 32.0-32.9,adult-current bmi 32.9 07/10/2022   Multiple thyroid nodules 06/21/2022   Type 2 diabetes mellitus with obesity (HCC) 05/28/2022   Environmental allergies 05/28/2022   Class 2 severe obesity with serious comorbidity and body mass index (BMI) of 37.0 to 37.9 in adult (HCC) 05/28/2022   H/O gastroesophageal reflux (GERD) 05/28/2022   Hypomagnesemia 02/04/2022   SOB (shortness of breath) on exertion 02/04/2022   Mixed stress and urge urinary incontinence 10/04/2021   S/P hernia repair 07/03/2021   Atherosclerosis of aorta (HCC)  05/28/2021   Mood disorder (HCC) with emotional eating 05/21/2021   Cutaneous abscess of back excluding buttocks 01/12/2021   Depressed mood, with emotional eating 10/23/2020   Seasonal allergies 09/05/2020   Class 2 severe obesity with serious comorbidity and body mass index (BMI) of 36.0 to 36.9 in adult (HCC) 07/24/2020   At risk for impaired metabolic function 06/12/2020   Hypertension associated with type 2 diabetes mellitus (HCC) 05/15/2020   Stage 3  chronic kidney disease (HCC) 05/01/2020   Type 2 diabetes mellitus with chronic kidney disease, with long-term current use of insulin (HCC) 04/17/2020   At risk for hypoglycemia 04/17/2020   B12 deficiency 02/29/2020   Diabetes mellitus (HCC) 02/15/2020   Vitamin D deficiency 02/15/2020   Other insomnia 01/24/2020   Morbid obesity (HCC) 01/24/2020   Elevated serum immunoglobulin free light chains 01/21/2020   Thyroid nodule 10/14/2019   Renal insufficiency 06/24/2019   Hypercalcemia 06/24/2019   Hiatal hernia with GERD without esophagitis 06/18/2017   Need for influenza vaccination 02/17/2017   Ventral hernia 01/24/2016   Type 2 diabetes mellitus without complication, without long-term current use of insulin (HCC) 10/04/2015   Generalized obesity 10/04/2015   Hyperlipidemia associated with type 2 diabetes mellitus (HCC) 07/27/2015   Neuropathic pain of both legs 07/27/2015   Obesity (BMI 30.0-34.9) 12/24/2014   Genetic testing 11/01/2014   Constipation - functional 09/24/2014   Chemotherapy-induced peripheral neuropathy (HCC) 09/24/2014   Midline low back pain without sciatica 09/24/2014   Family history of colon cancer    Hilar adenopathy    Hypertension associated with diabetes (HCC)    Pulmonary nodule 1 cm or greater in diameter      Current Outpatient Medications on File Prior to Visit  Medication Sig Dispense Refill   ACCU-CHEK AVIVA PLUS test strip TEST BLOOD SUGAR AS DIRECTED 300 strip 3   albuterol (VENTOLIN HFA) 108 (90 Base) MCG/ACT inhaler Inhale 2 puffs into the lungs every 6 (six) hours as needed for wheezing or shortness of breath. 8 g 0   Blood Glucose Monitoring Suppl (ACCU-CHEK AVIVA PLUS) w/Device KIT USE AS DIRECTED 1 kit 0   buPROPion (WELLBUTRIN SR) 200 MG 12 hr tablet Take 1 tablet (200 mg total) by mouth 2 (two) times daily. 60 tablet 0   Cholecalciferol (VITAMIN D3) 25 MCG (1000 UT) CAPS Take 1 capsule (1,000 Units total) by mouth daily. Per Endo in 10/2022  60 capsule    dorzolamide-timolol (COSOPT) 2-0.5 % ophthalmic solution Place 1 drop into both eyes 2 (two) times daily. 10 mL 3   DROPLET PEN NEEDLES 29G X MISC USE AS DIRECTED 100 each 3   HYDROcodone-acetaminophen (NORCO/VICODIN) 5-325 MG tablet Take 1 tablet by mouth every 4 (four) hours as needed for pain. 12 tablet 0   Lancets Misc. (ACCU-CHEK SOFTCLIX LANCET DEV) KIT Check blood sugars 3 times a day. 3 kit 6   latanoprost (XALATAN) 0.005 % ophthalmic solution Place 1 drop into both eyes every evening at bedtime as directed 2.5 mL 6   levocetirizine (XYZAL) 5 MG tablet TAKE 1 TABLET EVERY EVENING 90 tablet 0   lidocaine (LIDODERM) 5 % PLACE 1 PATCH ONTO THE SKIN DAILY. REMOVE AND DISCARD PATCH WITHIN 12 HOURS OR AS DIRECTED BY MD 30 patch 2   losartan (COZAAR) 50 MG tablet TAKE 1 TABLET EVERY DAY 90 tablet 3   methocarbamol (ROBAXIN) 500 MG tablet Take 1 tablet (500 mg total) by mouth 2 (two) times daily as needed for muscle  spasms. 40 tablet 3   omeprazole (PRILOSEC) 20 MG capsule Take 1 capsule (20 mg total) by mouth daily. 90 capsule 0   tirzepatide (MOUNJARO) 12.5 MG/0.5ML Pen Inject 12.5 mg into the skin once a week. 2 mL 0   traMADol (ULTRAM) 50 MG tablet Take 1 tablet (50 mg total) by mouth every 12 (twelve) hours as needed. 60 tablet 0   traZODone (DESYREL) 100 MG tablet Take 1 tablet (100 mg total) by mouth at bedtime as needed for sleep. 30 tablet 0   triamcinolone cream (KENALOG) 0.1 % Apply 1 Application topically 2 (two) times daily. 30 g 0   vitamin B-12 (CYANOCOBALAMIN) 500 MCG tablet Take 1 tablet (500 mcg total) by mouth daily.     No current facility-administered medications on file prior to visit.    Allergies  Allergen Reactions   Emend [Aprepitant] Other (See Comments)    Urticaria    Lisinopril Cough    Social History   Socioeconomic History   Marital status: Married    Spouse name: Not on file   Number of children: 2   Years of education: Not on file    Highest education level: 12th grade  Occupational History   Occupation: disability  Tobacco Use   Smoking status: Never   Smokeless tobacco: Never  Vaping Use   Vaping status: Never Used  Substance and Sexual Activity   Alcohol use: No   Drug use: No   Sexual activity: Not Currently    Birth control/protection: None  Other Topics Concern   Not on file  Social History Narrative   No issues with transportation.    Attends church   Social Determinants of Health   Financial Resource Strain: Low Risk  (09/16/2022)   Overall Financial Resource Strain (CARDIA)    Difficulty of Paying Living Expenses: Not very hard  Food Insecurity: No Food Insecurity (09/16/2022)   Hunger Vital Sign    Worried About Running Out of Food in the Last Year: Never true    Ran Out of Food in the Last Year: Never true  Transportation Needs: No Transportation Needs (09/16/2022)   PRAPARE - Administrator, Civil Service (Medical): No    Lack of Transportation (Non-Medical): No  Physical Activity: Sufficiently Active (09/16/2022)   Exercise Vital Sign    Days of Exercise per Week: 3 days    Minutes of Exercise per Session: 50 min  Stress: No Stress Concern Present (09/16/2022)   [employer] of Occupational Health - Occupational Stress Questionnaire    Feeling of Stress : Only a little  Social Connections: Moderately Integrated (09/16/2022)   Social Connection and Isolation Panel [NHANES]    Frequency of Communication with Friends and Family: More than three times a week    Frequency of Social Gatherings with Friends and Family: Twice a week    Attends Religious Services: More than 4 times per year    Active Member of [employer] or Organizations: Yes    Attends Engineer, structural: More than 4 times per year    Marital Status: Separated  Recent Concern: Social Connections - Moderately Isolated (06/28/2022)   Social Connection and Isolation Panel [NHANES]    Frequency of Communication with  Friends and Family: More than three times a week    Frequency of Social Gatherings with Friends and Family: More than three times a week    Attends Religious Services: More than 4 times per year    Active Member of  Clubs or Organizations: No    Attends Banker Meetings: Never    Marital Status: Separated  Intimate Partner Violence: Not At Risk (06/28/2022)   Humiliation, Afraid, Rape, and Kick questionnaire    Fear of Current or Ex-Partner: No    Emotionally Abused: No    Physically Abused: No    Sexually Abused: No    Family History  Problem Relation Age of Onset   Hypertension Mother    Hyperlipidemia Mother    Stroke Mother    Thyroid disease Mother    Arthritis Mother    Hearing loss Mother    Hypertension Father    Diabetes Father    Hyperlipidemia Father    Sudden death Father    Arthritis Father    Heart disease Father    Obesity Father    Cancer Sister 28       fibrosarcoma (back); currently 28   Cancer Brother    Prostate cancer Maternal Uncle    Colon cancer Paternal Aunt        Dx 89s; deceased 3s   Prostate cancer Paternal Uncle        currently 38   Cancer Paternal Uncle 19       "bone" ; unk. primary   Stomach cancer Paternal Uncle    Hypercalcemia Neg Hx     Past Surgical History:  Procedure Laterality Date   ABDOMINAL HYSTERECTOMY     CATARACT EXTRACTION     RT eye 11/28/22 and LT eye 12/19/2022   FOOT SURGERY  04/2018   [facility]   HERNIA REPAIR     INCISIONAL HERNIA REPAIR N/A 07/06/2015   Procedure: INCISIONAL HERNIA REPAIR ;  Surgeon: Emelia Loron, MD;  Location: [facility];  Service: General;  Laterality: N/A;   INCISIONAL HERNIA REPAIR N/A 07/03/2021   Procedure: Sherald Hess HERNIA REPAIR WITH MESH;  Surgeon: Axel Filler, MD;  Location: [facility];  Service: General;  Laterality: N/A;   INSERTION OF MESH N/A 07/06/2015   Procedure: WITH INSERTION OF PHASIX ST MESH;  Surgeon: Emelia Loron, MD;  Location: [facility];   Service: General;  Laterality: N/A;   LAPAROTOMY N/A 07/06/2015   Procedure: EXPLORATORY LAPAROTOMY;  Surgeon: Adolphus Birchwood, MD;  Location: [facility];  Service: Gynecology;  Laterality: N/A;   LYSIS OF ADHESION N/A 07/06/2015   Procedure:  LYSIS OF ADHESION RESECTION OF MESENTERIC MASS BOWEL RESECTION ;  Surgeon: Adolphus Birchwood, MD;  Location: [facility];  Service: Gynecology;  Laterality: N/A;   LYSIS OF ADHESION N/A 07/03/2021   Procedure: EXPLORATORY LAPAROTOMY WITH LYSIS OF ADHESION;  Surgeon: Axel Filler, MD;  Location: [facility] OR;  Service: General;  Laterality: N/A;   ROBOTIC ASSISTED TOTAL HYSTERECTOMY WITH BILATERAL SALPINGO OOPHERECTOMY Bilateral 07/28/2014   Procedure: ROBOTIC ASSISTED lysis of adhesions with biopsies, converted to LAPAROTOMY, bilateral salpingoorphorectomy, omentectomy,appendectomy;  Surgeon: Laurette Schimke, MD;  Location: [facility];  Service: Gynecology;  Laterality: Bilateral;   THYROIDECTOMY, PARTIAL     VIDEO BRONCHOSCOPY N/A 01/13/2020   Procedure: VIDEO BRONCHOSCOPY WITH BIOPSIES;  Surgeon: Loreli Slot, MD;  Location: [facility];  Service: Thoracic;  Laterality: N/A;    ROS: Review of Systems Negative except as stated above  PHYSICAL EXAM: BP 128/79   Pulse 68   Temp 98 F (36.7 C) (Oral)   Resp 16   Wt 211 lb (95.7 kg)   LMP  (LMP Unknown)   SpO2 98%   BMI 35.11 kg/m   Wt Readings from Last 3  Encounters:  01/20/23 211 lb (95.7 kg)  01/16/23 203 lb (92.1 kg)  01/08/23 211 lb (95.7 kg)    Physical Exam  General appearance - alert, well appearing, and in no distress Mental status - normal mood, behavior, speech, dress, motor activity, and thought processes Neck - supple, no significant adenopathy Chest - clear to auscultation, no wheezes, rales or rhonchi, symmetric air entry Heart - normal rate, regular rhythm, normal S1, S2, no murmurs, rubs, clicks or gallops Extremities - peripheral pulses normal, no pedal edema, no clubbing or cyanosis Diabetic  Foot Exam - Simple   Simple Foot Form Diabetic Foot exam was performed with the following findings: Yes 01/20/2023 10:04 AM  Visual Inspection See comments: Yes Sensation Testing Intact to touch and monofilament testing bilaterally: Yes Pulse Check Posterior Tibialis and Dorsalis pulse intact bilaterally: Yes Comments Slightly flat-footed.  Small callus on left fifth toe dorsally.  Small callus on medial aspect of the right big toe.         Latest Ref Rng & Units 10/21/2022   12:16 PM 06/21/2022    4:16 PM 03/28/2022    3:08 PM  CMP  Glucose 70 - 99 mg/dL  956  81   BUN 6 - 24 mg/dL  20  19   Creatinine 2.13 - 1.00 mg/dL  0.86  5.78   Sodium 469 - 144 mmol/L  141  139   Potassium 3.5 - 5.2 mmol/L  4.2  3.7   Chloride 96 - 106 mmol/L  102  106   CO2 20 - 29 mmol/L  18  26   Calcium 8.7 - 10.2 mg/dL  62.9  52.8   Total Protein 6.0 - 8.5 g/dL 7.7      Lipid Panel     Component Value Date/Time   CHOL 167 01/09/2022 0855   TRIG 192 (H) 01/09/2022 0855   HDL 45 01/09/2022 0855   CHOLHDL 3.7 01/09/2022 0855   CHOLHDL 5.1 (H) 08/14/2016 1729   VLDL 74 (H) 08/14/2016 1729   LDLCALC 89 01/09/2022 0855    CBC    Component Value Date/Time   WBC 5.4 03/28/2022 1508   RBC 4.97 03/28/2022 1508   HGB 15.0 03/28/2022 1508   HGB 13.3 02/03/2020 0837   HGB 13.6 12/04/2018 0914   HGB 12.9 04/22/2016 0750   HCT 46.8 (H) 03/28/2022 1508   HCT 41.0 12/04/2018 0914   HCT 39.0 04/22/2016 0750   PLT 318 03/28/2022 1508   PLT 270 02/03/2020 0837   PLT 332 12/04/2018 0914   MCV 94.2 03/28/2022 1508   MCV 88 12/04/2018 0914   MCV 92.2 04/22/2016 0750   MCH 30.2 03/28/2022 1508   MCHC 32.1 03/28/2022 1508   RDW 13.6 03/28/2022 1508   RDW 13.6 12/04/2018 0914   RDW 13.9 04/22/2016 0750   LYMPHSABS 2.4 03/28/2022 1508   LYMPHSABS 2.7 04/22/2016 0750   MONOABS 0.8 03/28/2022 1508   MONOABS 1.0 (H) 04/22/2016 0750   EOSABS 0.1 03/28/2022 1508   EOSABS 0.1 04/22/2016 0750   BASOSABS  0.0 03/28/2022 1508   BASOSABS 0.0 04/22/2016 0750    ASSESSMENT AND PLAN:  1. Type 2 diabetes mellitus with obesity (HCC) Goal.  She will continue Mounjaro 12.5 mg once a week and metformin 500 mg daily.  Advised against skipping meals.  Even with decreased appetite, she should try to eat a little something for each meal.  Continue regular exercise.  We will request eye exam report from Dr. Wynelle Link. -  HgB A1c - Accu-Chek Softclix Lancets lancets; TEST BLOOD SUGAR THREE TIMES DAILY  Dispense: 300 each; Refill: 3 - gabapentin (NEURONTIN) 600 MG tablet; Take 1 tablet (600 mg total) by mouth 3 (three) times daily.  Dispense: 270 tablet; Refill: 2 - metFORMIN (GLUCOPHAGE) 500 MG tablet; Take 1 tablet (500 mg total) by mouth daily with breakfast.  Dispense: 90 tablet; Refill: 3 - Basic Metabolic Panel  2. Hypertension associated with diabetes (HCC) At goal. Continue amlodipine 10 mg daily, hydralazine 25 mg 1-1/2 tablets 3 times a day, Cozaar 50 mg daily, metoprolol 25 mg half tablet twice a day  - metoprolol tartrate (LOPRESSOR) 25 MG tablet; TAKE 1/2 TABLET TWICE DAILY  Dispense: 90 tablet; Refill: 3 - hydrALAZINE (APRESOLINE) 25 MG tablet; TAKE 1 AND 1/2 TABLETS THREE TIMES DAILY  Dispense: 405 tablet; Refill: 3 - amLODipine (NORVASC) 10 MG tablet; Take 1 tablet (10 mg total) by mouth daily.  Dispense: 90 tablet; Refill: 3  3. Hyperlipidemia associated with type 2 diabetes mellitus (HCC) Continue Crestor.  Due for lipid check. - rosuvastatin (CRESTOR) 20 MG tablet; Take 1 tablet (20 mg total) by mouth daily.  Dispense: 90 tablet; Refill: 3 - Lipid panel  4. CKD stage 3a, GFR 45-59 ml/min (HCC) Continue to monitor.  Avoid NSAIDs.  5. Hypercalcemia Followed by endocrinology.  Await appointment for parathyroid scan  6. Lung nodule Followed by pulmonary.  CT scan of the chest ordered.  7. Environmental allergies - montelukast (SINGULAIR) 10 MG tablet; Take 1 tablet (10 mg total) by mouth at  bedtime.  Dispense: 90 tablet; Refill: 3  8. Mixed stress and urge urinary incontinence - solifenacin (VESICARE) 5 MG tablet; Take 1 tablet (5 mg total) by mouth daily.  Dispense: 90 tablet; Refill: 3     Patient was given the opportunity to ask questions.  Patient verbalized understanding of the plan and was able to repeat key elements of the plan.   This documentation was completed using Paediatric nurse.  Any transcriptional errors are unintentional.  Orders Placed This Encounter  Procedures   Lipid panel   Basic Metabolic Panel   HgB A1c     Requested Prescriptions   Signed Prescriptions Disp Refills   Accu-Chek Softclix Lancets lancets 300 each 3    Sig: TEST BLOOD SUGAR THREE TIMES DAILY   gabapentin (NEURONTIN) 600 MG tablet 270 tablet 2    Sig: Take 1 tablet (600 mg total) by mouth 3 (three) times daily.   metFORMIN (GLUCOPHAGE) 500 MG tablet 90 tablet 3    Sig: Take 1 tablet (500 mg total) by mouth daily with breakfast.   metoprolol tartrate (LOPRESSOR) 25 MG tablet 90 tablet 3    Sig: TAKE 1/2 TABLET TWICE DAILY   montelukast (SINGULAIR) 10 MG tablet 90 tablet 3    Sig: Take 1 tablet (10 mg total) by mouth at bedtime.   hydrALAZINE (APRESOLINE) 25 MG tablet 405 tablet 3    Sig: TAKE 1 AND 1/2 TABLETS THREE TIMES DAILY   amLODipine (NORVASC) 10 MG tablet 90 tablet 3    Sig: Take 1 tablet (10 mg total) by mouth daily.   rosuvastatin (CRESTOR) 20 MG tablet 90 tablet 3    Sig: Take 1 tablet (20 mg total) by mouth daily.   solifenacin (VESICARE) 5 MG tablet 90 tablet 3    Sig: Take 1 tablet (5 mg total) by mouth daily.    Return in about 4 months (around 05/22/2023) for chronic ds management.  Give appt with RN in Sept to get flu shot.Jonah Blue, MD, FACP

## 2023-01-21 ENCOUNTER — Other Ambulatory Visit: Payer: Self-pay

## 2023-01-22 ENCOUNTER — Ambulatory Visit
Admission: RE | Admit: 2023-01-22 | Discharge: 2023-01-22 | Disposition: A | Discharge status: Home / Self Care | Payer: Medicare PPO | Source: Ambulatory Visit | Attending: Emergency Medicine | Admitting: Emergency Medicine

## 2023-01-22 DIAGNOSIS — Z8543 Personal history of malignant neoplasm of ovary: Secondary | ICD-10-CM | POA: Diagnosis not present

## 2023-01-22 DIAGNOSIS — I700 Atherosclerosis of aorta: Secondary | ICD-10-CM | POA: Diagnosis not present

## 2023-01-22 DIAGNOSIS — R911 Solitary pulmonary nodule: Secondary | ICD-10-CM | POA: Diagnosis not present

## 2023-01-23 ENCOUNTER — Other Ambulatory Visit (HOSPITAL_COMMUNITY): Payer: Self-pay

## 2023-01-24 ENCOUNTER — Other Ambulatory Visit (HOSPITAL_COMMUNITY): Payer: Self-pay

## 2023-01-25 ENCOUNTER — Other Ambulatory Visit: Payer: Self-pay | Admitting: Internal Medicine

## 2023-01-25 DIAGNOSIS — M545 Low back pain, unspecified: Secondary | ICD-10-CM

## 2023-01-25 DIAGNOSIS — G8929 Other chronic pain: Secondary | ICD-10-CM

## 2023-01-29 ENCOUNTER — Other Ambulatory Visit: Payer: Medicare PPO

## 2023-01-30 ENCOUNTER — Encounter (INDEPENDENT_AMBULATORY_CARE_PROVIDER_SITE_OTHER): Payer: Self-pay | Admitting: Family Medicine

## 2023-01-30 ENCOUNTER — Ambulatory Visit (INDEPENDENT_AMBULATORY_CARE_PROVIDER_SITE_OTHER): Payer: Medicare PPO | Admitting: Family Medicine

## 2023-01-30 VITALS — BP 133/85 | HR 78 | Temp 97.5°F | Ht 65.0 in | Wt 207.0 lb

## 2023-01-30 DIAGNOSIS — Z7984 Long term (current) use of oral hypoglycemic drugs: Secondary | ICD-10-CM | POA: Diagnosis not present

## 2023-01-30 DIAGNOSIS — G4709 Other insomnia: Secondary | ICD-10-CM

## 2023-01-30 DIAGNOSIS — Z7985 Long-term (current) use of injectable non-insulin antidiabetic drugs: Secondary | ICD-10-CM

## 2023-01-30 DIAGNOSIS — E1169 Type 2 diabetes mellitus with other specified complication: Secondary | ICD-10-CM | POA: Diagnosis not present

## 2023-01-30 DIAGNOSIS — F39 Unspecified mood [affective] disorder: Secondary | ICD-10-CM

## 2023-01-30 DIAGNOSIS — Z9109 Other allergy status, other than to drugs and biological substances: Secondary | ICD-10-CM | POA: Diagnosis not present

## 2023-01-30 DIAGNOSIS — E669 Obesity, unspecified: Secondary | ICD-10-CM

## 2023-01-30 DIAGNOSIS — Z6834 Body mass index (BMI) 34.0-34.9, adult: Secondary | ICD-10-CM | POA: Diagnosis not present

## 2023-01-30 DIAGNOSIS — G622 Polyneuropathy due to other toxic agents: Secondary | ICD-10-CM | POA: Diagnosis not present

## 2023-01-30 MED ORDER — LEVOCETIRIZINE DIHYDROCHLORIDE 5 MG PO TABS
5.0000 mg | ORAL_TABLET | Freq: Every evening | ORAL | 0 refills | Status: DC
Start: 2023-01-30 — End: 2023-03-05

## 2023-01-30 NOTE — Progress Notes (Signed)
Sarah [name], D.O.  ABFM, ABOM Specializing in Clinical Bariatric Medicine  Office located at: [address] [place], [place]  [address]     Assessment and Plan:   Medications Discontinued During This Encounter  Medication Reason   traZODone (DESYREL) 100 MG tablet Patient has not taken in last 30 days   levocetirizine (XYZAL) 5 MG tablet Reorder     Meds ordered this encounter  Medications   levocetirizine (XYZAL) 5 MG tablet    Sig: Take 1 tablet (5 mg total) by mouth every evening.    Dispense:  90 tablet    Refill:  0     Type 2 diabetes mellitus with obesity (HCC) Assessment & Plan: Condition treated with Mounjaro 12.5 mg once a week and Metformin 500 mg daily. Last A1c was 6.3 at PCP's office.   Lab Results  Component Value Date   HGBA1C 6.3 01/20/2023   HGBA1C 6.0 09/17/2022   HGBA1C 5.5 05/17/2022    C/w Mounjaro and Metformin at current doses. Continue regular exercise and work on eating all foods on meal plan. 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 Since Neurontin can make weight loss more challenging, I advised pt to discuss with PCP about other possible alternatives such as a Lidoderm patch, Capsaicin cream, Cymbalta, or steroid injections.    Depressed mood (HCC) with emotional eating Assessment & Plan: Reports feeling sad recently because her husband has "some type of heart disease". Also has been more anxious about the nodules on her lung due to her past hx of cancer. Has an upcoming appointment with Dr.Byrum to discuss CT Results. She started the Wellbutrin 200 mg bid about 1 week ago & has not noticed any differences yet - pt advised to continue with medication.    Other insomnia Assessment & Plan: Pt reports not taking Desyrel 100  mg in the last 30 days -  feels that it was not effective. Endorses that Melatonin has not worked well for her either. Her insomnia appears to be stress related. Pt encouraged to  increase activity/exercise for stress management and we discussed proper sleep hygiene and the importance of recreating healthy habits each and every night. Will continue to monitor.    Environmental allergies Assessment & Plan: Allergy symptoms are stable with Xyzal 5 mg daily & Singulair 10 mg daily.  C/w low inflammatory diet and both meds - will refill Xyzal today.     TREATMENT PLAN FOR OBESITY: BMI 34.0-34.9,adult- current BMI 34.45 Obesity with starting BMI 37.9/date 02/15/2020 Assessment & Plan: [name] is here to discuss her progress with her obesity treatment plan along with follow-up of her obesity related diagnoses. See Medical Weight Management Flowsheet for complete bioelectrical impedance results.  Since last office visit on 01/16/23 patient's muscle mass has decreased by 0.2 lb. Fat mass has increased by 3.8 lb. Total body water has increased by 2.8 lb.  Counseling done on how various foods will affect these numbers and how to maximize success  Total lbs lost to date: 21 lbs  Total weight loss percentage to date: 9.21%   No change to meal plan - see Subjective   Behavioral Intervention Additional resources provided today:  Medications that can increase weight handout  & Additional Lunch Options Evidence-based interventions for health  behavior change were utilized today including the discussion of self monitoring techniques, problem-solving barriers and SMART goal setting techniques.   Regarding patient's less desirable eating habits and patterns, we employed the technique of small changes.  Pt will specifically work on: trying to eat everything on plan for next visit.    FOLLOW UP: Return in about 20 days (around 02/19/2023). She was informed of the importance of frequent follow up visits to  maximize her success with intensive lifestyle modifications for her multiple health conditions.  Subjective:   Chief complaint: Obesity Tenica is here to discuss her progress with her obesity treatment plan. She is on the Category 1 Plan and keeping a food journal and adhering to recommended goals of 1000-1100 calories and 75+ protein and states she is following her eating plan approximately 50% of the time. She states she is walking (silver sneakers) 50 minutes, 3 x per wk and gym (walking on track) 60 minutes, 2 x a wk.   Interval History:  [name] is here for a follow up office visit. Since last OV, Sincere has been doing okay. Has been concerned about her lung nodule and husband's health. Has not been really journaling her intake - reports that seeing how low she is in calories on some days makes her more depressed. No complaints of cravings.   Pharmacotherapy for weight loss: She is currently taking  Metformin 500 mg daily & Mounjaro 12.5 mg once wkly   for medical weight loss.  Denies side effects.    Review of Systems:  Pertinent positives were addressed with patient today.  Reviewed by clinician on day of visit: allergies, medications, problem list, medical history, surgical history, family history, social history, and previous encounter notes.  Weight Summary and Biometrics   Weight Lost Since Last Visit: 0lb  Weight Gained Since Last Visit: 4lb   Vitals Temp: (!) 97.5 F (36.4 C) BP: 133/85 Pulse Rate: 78 SpO2: 96 %   Anthropometric Measurements Height: 5\' 5"  (1.651 m) Weight: 207 lb (93.9 kg) BMI (Calculated): 34.45 Weight at Last Visit: 203lb Weight Lost Since Last Visit: 0lb Weight Gained Since Last Visit: 4lb Starting Weight: 228lb Total Weight Loss (lbs): 21 lb (9.526 kg) Peak Weight: 228lb   Body Composition  Body Fat %: 41.3 % Fat Mass (lbs): 85.6 lbs Muscle Mass (lbs): 115.4 lbs Total Body Water (lbs): 76 lbs Visceral Fat Rating : 11   Other  Clinical Data Fasting: no Labs: no Today's Visit #: 67 Starting Date: 02/15/20   Objective:   PHYSICAL EXAM: Blood pressure 133/85, pulse 78, temperature (!) 97.5 F (36.4 C), height 5\' 5"  (1.651 m), weight 207 lb (93.9 kg), SpO2 96%. Body mass index is 34.45 kg/m.  General: Well Developed, well nourished, and in no acute distress.  HEENT: Normocephalic, atraumatic Skin: Warm and dry, cap RF less 2 sec, good turgor Chest:  Normal excursion, shape, no gross abn Respiratory: speaking in full sentences, no conversational dyspnea NeuroM-Sk: Ambulates w/o assistance, moves * 4 Psych: A and O *3, insight good, mood-full  DIAGNOSTIC DATA REVIEWED:  BMET    Component Value Date/Time   NA 141 06/21/2022 1616   NA 138 04/22/2016 0750   K 4.2 06/21/2022 1616   K 3.9 04/22/2016 0750   CL 102 06/21/2022 1616   CO2 18 (L) 06/21/2022 1616   CO2 26 04/22/2016 0750   GLUCOSE 102 (H) 06/21/2022 1616   GLUCOSE 81 03/28/2022 1508   GLUCOSE 142 (H) 04/22/2016 0750  BUN 20 06/21/2022 1616   BUN 19.1 11/20/2016 1355   CREATININE 1.22 (H) 06/21/2022 1616   CREATININE 1.26 (H) 12/29/2019 1015   CREATININE 1.1 11/20/2016 1355   CALCIUM 12.2 (H) 06/21/2022 1616   CALCIUM 9.8 04/22/2016 0750   GFRNONAA 47 (L) 03/28/2022 1508   GFRNONAA 48 (L) 12/29/2019 1015   GFRNONAA 63 07/08/2014 1212   GFRAA 69 04/17/2020 0928   GFRAA 56 (L) 12/29/2019 1015   GFRAA 73 07/08/2014 1212   Lab Results  Component Value Date   HGBA1C 6.3 01/20/2023   HGBA1C 10.1 (H) 07/07/2015   No results found for: "INSULIN" Lab Results  Component Value Date   TSH 1.370 06/21/2022   CBC    Component Value Date/Time   WBC 5.4 03/28/2022 1508   RBC 4.97 03/28/2022 1508   HGB 15.0 03/28/2022 1508   HGB 13.3 02/03/2020 0837   HGB 13.6 12/04/2018 0914   HGB 12.9 04/22/2016 0750   HCT 46.8 (H) 03/28/2022 1508   HCT 41.0 12/04/2018 0914   HCT 39.0 04/22/2016 0750   PLT 318 03/28/2022 1508   PLT 270 02/03/2020  0837   PLT 332 12/04/2018 0914   MCV 94.2 03/28/2022 1508   MCV 88 12/04/2018 0914   MCV 92.2 04/22/2016 0750   MCH 30.2 03/28/2022 1508   MCHC 32.1 03/28/2022 1508   RDW 13.6 03/28/2022 1508   RDW 13.6 12/04/2018 0914   RDW 13.9 04/22/2016 0750   Iron Studies    Component Value Date/Time   IRON 43 08/18/2010 0515   TIBC 389 08/18/2010 0515   FERRITIN 227 08/18/2010 0515   IRONPCTSAT 11 (L) 08/18/2010 0515   Lipid Panel     Component Value Date/Time   CHOL 161 01/20/2023 1032   TRIG 183 (H) 01/20/2023 1032   HDL 46 01/20/2023 1032   CHOLHDL 3.5 01/20/2023 1032   CHOLHDL 5.1 (H) 08/14/2016 1729   VLDL 74 (H) 08/14/2016 1729   LDLCALC 84 01/20/2023 1032   Hepatic Function Panel     Component Value Date/Time   PROT 7.7 10/21/2022 1216   PROT 8.2 04/22/2016 0750   ALBUMIN 4.6 01/09/2022 0855   ALBUMIN 3.8 04/22/2016 0750   AST 26 01/09/2022 0855   AST 29 12/29/2019 1015   AST 27 04/22/2016 0750   ALT 29 01/09/2022 0855   ALT 33 12/29/2019 1015   ALT 24 04/22/2016 0750   ALKPHOS 74 01/09/2022 0855   ALKPHOS 74 04/22/2016 0750   BILITOT 0.5 01/09/2022 0855   BILITOT 0.6 12/29/2019 1015   BILITOT 0.44 04/22/2016 0750   BILIDIR 0.2 08/18/2010 0515   IBILI 1.5 (H) 08/18/2010 0515      Component Value Date/Time   TSH 1.370 06/21/2022 1616   Nutritional Lab Results  Component Value Date   VD25OH 29.26 (L) 10/21/2022   VD25OH 31.2 06/21/2022   VD25OH 35.7 10/22/2021    Attestations:   I, Special Puri, acting as a Stage manager for Thomasene Lot, DO., have compiled all relevant documentation for today's office visit on behalf of Thomasene Lot, DO, while in the presence of [employer], DO.  I have reviewed the above documentation for accuracy and completeness, and I agree with the above. Sarah [name], D.O.  The 21st Century Cures Act was signed into law in 2016 which includes the topic of electronic health records.  This provides immediate access  to information in MyChart.  This includes consultation notes, operative notes, office notes, lab results and pathology  reports.  If you have any questions about what you read please let us know at your next visit so we can discuss your concerns and take corrective action if need be.  We are right here with you.

## 2023-02-05 ENCOUNTER — Ambulatory Visit: Payer: Medicare PPO | Admitting: Emergency Medicine

## 2023-02-05 ENCOUNTER — Encounter: Payer: Self-pay | Admitting: Emergency Medicine

## 2023-02-05 VITALS — BP 114/66 | HR 69 | Temp 97.5°F | Ht 65.0 in | Wt 209.6 lb

## 2023-02-05 DIAGNOSIS — R911 Solitary pulmonary nodule: Secondary | ICD-10-CM

## 2023-02-05 DIAGNOSIS — R053 Chronic cough: Secondary | ICD-10-CM | POA: Diagnosis not present

## 2023-02-05 MED ORDER — LORATADINE 10 MG PO TABS: 10.0000 mg | ORAL_TABLET | Freq: Every day | ORAL | 3 refills | Status: DC

## 2023-02-05 MED ORDER — FLUTICASONE PROPIONATE 50 MCG/ACT NA SUSP: 2.0000 | Freq: Every day | NASAL | 3 refills | Status: DC

## 2023-02-05 NOTE — Patient Instructions (Addendum)
We reviewed your CT scan of the chest today. Will plan to repeat your CT chest in August 2025 to follow pulmonary nodule, presumed carcinoid We discussed the possible strategies to address carcinoid tumor.  Ultimately we will probably decide to refer you to thoracic surgery to consider having it electively removed.  Please call if you develop any new breathing symptoms Please start loratadine 10 mg once daily line please start fluticasone nasal spray, 2 sprays each nostril once daily. Temporarily increase your omeprazole to 20 mg twice a day for 2 weeks. Try to avoid throat clearing Avoid singing for 2 weeks Follow Dr. Delton Coombes in August 2025, sooner if you have any problems.

## 2023-02-05 NOTE — Progress Notes (Signed)
Subjective:    Patient ID: Sarah [name], female    DOB: 20-Sep-1965, 57 y.o.   MRN: [id number]  HPI 58 year old never smoker with a history of diabetes, ovarian cancer with peritoneal carcinomatosis (2016, TAH/BSO + chemo), hypertension, glaucoma, GERD. She is here today to discuss pulmonary nodule.  She had a CT scan of the chest 07/11/2020 to follow an 11 mm anterior right lower lobe nodule that may have been present dating back to 2016 with slow increase in size.  It was negative on a prior dotatate PET scan done on 11/2019.  Carcinoid was questioned. She has been experiencing hypercalcemia, followed by Dr Elvera Lennox. 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 She works out at [employer] without difficulty. She has a cough - often at random. She clears her throat. She has allergies and GERD.   ROV 02/05/2023 --follow-up visit for 57 year old woman with history of ovarian cancer with peritoneal carcinomatosis (treated 2016), diabetes, hypertension, glaucoma, GERD.  I saw her in late July for pulmonary nodule and coughing.  She had around isolated right lower lobe pulmonary nodule that was negative on a dotatate PET scan and has slowly enlarged in size going back to 2016.  She has hypercalcemia followed by Dr. Elvera Lennox.  Following PTH, 29 and 10/21/2022. Today she reports  Super D CT chest done on 01/22/2023 reviewed by me, shows no mediastinal or axillary nodes.  Minimal scattered pulmonary parenchymal scarring, stable right infrahilar right lower lobe pulmonary nodule stable back to 07/2020 but enlarged going back to 01/2015 (when it was 4 mm).  No suspicious nodules or infiltrates. She has a dry cough. Does have some GERD, some better on omeprazole every day. Has a lot of clear nasal drainage, no sputum   Review of Systems As per HPI  Past Medical History:  Diagnosis Date   Arthritis    Diabetes mellitus without complication (HCC)    Family history of adverse reaction to anesthesia    sister-n/v    Family history of colon cancer    GERD (gastroesophageal reflux disease)    Glaucoma 07/2014   Glucagonoma 07/28/2014   Pt denies this but states she has glaucoma   History of chemotherapy    History of hiatal hernia    Hypercalcemia    Hypertension    Imbalance    Neuropathy    feet bilat   Nocturia    Peritoneal carcinomatosis (HCC)    carcinoma of ovary    Shortness of breath dyspnea    hx of 2013 - no problems currently    Sleep apnea    Thyroid nodule    history of    Wears glasses      Family History  Problem Relation Age of Onset   Hypertension Mother    Hyperlipidemia Mother    Stroke Mother    Thyroid disease Mother    Arthritis Mother    Hearing loss Mother    Hypertension Father    Diabetes Father    Hyperlipidemia Father    Sudden death Father    Arthritis Father    Heart disease Father    Obesity Father    Cancer Sister 72       fibrosarcoma (back); currently 59   Cancer Brother    Prostate cancer Maternal Uncle    Colon cancer Paternal Aunt        Dx 41s; deceased 32s   Prostate cancer Paternal Uncle        currently  75   Cancer Paternal Uncle 43       "bone" ; unk. primary   Stomach cancer Paternal Uncle    Hypercalcemia Neg Hx      Social History   Socioeconomic History   Marital status: Married    Spouse name: Not on file   Number of children: 2   Years of education: Not on file   Highest education level: 12th grade  Occupational History   Occupation: disability  Tobacco Use   Smoking status: Never   Smokeless tobacco: Never  Vaping Use   Vaping status: Never Used  Substance and Sexual Activity   Alcohol use: No   Drug use: No   Sexual activity: Not Currently    Birth control/protection: None  Other Topics Concern   Not on file  Social History Narrative   No issues with transportation.    Attends church   Social Determinants of Health   Financial Resource Strain: Low Risk  (09/16/2022)   Overall Financial Resource Strain  (CARDIA)    Difficulty of Paying Living Expenses: Not very hard  Food Insecurity: No Food Insecurity (09/16/2022)   Hunger Vital Sign    Worried About Running Out of Food in the Last Year: Never true    Ran Out of Food in the Last Year: Never true  Transportation Needs: No Transportation Needs (09/16/2022)   PRAPARE - Administrator, Civil Service (Medical): No    Lack of Transportation (Non-Medical): No  Physical Activity: Sufficiently Active (09/16/2022)   Exercise Vital Sign    Days of Exercise per Week: 3 days    Minutes of Exercise per Session: 50 min  Stress: No Stress Concern Present (09/16/2022)   [employer] of Occupational Health - Occupational Stress Questionnaire    Feeling of Stress : Only a little  Social Connections: Moderately Integrated (09/16/2022)   Social Connection and Isolation Panel [NHANES]    Frequency of Communication with Friends and Family: More than three times a week    Frequency of Social Gatherings with Friends and Family: Twice a week    Attends Religious Services: More than 4 times per year    Active Member of [employer] or Organizations: Yes    Attends Engineer, structural: More than 4 times per year    Marital Status: Separated  Recent Concern: Social Connections - Moderately Isolated (06/28/2022)   Social Connection and Isolation Panel [NHANES]    Frequency of Communication with Friends and Family: More than three times a week    Frequency of Social Gatherings with Friends and Family: More than three times a week    Attends Religious Services: More than 4 times per year    Active Member of [employer] or Organizations: No    Attends Banker Meetings: Never    Marital Status: Separated  Intimate Partner Violence: Not At Risk (06/28/2022)   Humiliation, Afraid, Rape, and Kick questionnaire    Fear of Current or Ex-Partner: No    Emotionally Abused: No    Physically Abused: No    Sexually Abused: No     Allergies  Allergen  Reactions   Emend [Aprepitant] Other (See Comments)    Urticaria    Lisinopril Cough     Outpatient Medications Prior to Visit  Medication Sig Dispense Refill   ACCU-CHEK AVIVA PLUS test strip TEST BLOOD SUGAR AS DIRECTED 300 strip 3   Accu-Chek Softclix Lancets lancets TEST BLOOD SUGAR THREE TIMES DAILY 300 each 3  albuterol (VENTOLIN HFA) 108 (90 Base) MCG/ACT inhaler Inhale 2 puffs into the lungs every 6 (six) hours as needed for wheezing or shortness of breath. 8 g 0   amLODipine (NORVASC) 10 MG tablet Take 1 tablet (10 mg total) by mouth daily. 90 tablet 3   Blood Glucose Monitoring Suppl (ACCU-CHEK AVIVA PLUS) w/Device KIT USE AS DIRECTED 1 kit 0   buPROPion (WELLBUTRIN SR) 200 MG 12 hr tablet Take 1 tablet (200 mg total) by mouth 2 (two) times daily. 60 tablet 0   Cholecalciferol (VITAMIN D3) 25 MCG (1000 UT) CAPS Take 1 capsule (1,000 Units total) by mouth daily. Per Endo in 10/2022 60 capsule    dorzolamide-timolol (COSOPT) 2-0.5 % ophthalmic solution Place 1 drop into both eyes 2 (two) times daily. 10 mL 3   DROPLET PEN NEEDLES 29G X MISC USE AS DIRECTED 100 each 3   gabapentin (NEURONTIN) 600 MG tablet Take 1 tablet (600 mg total) by mouth 3 (three) times daily. 270 tablet 2   hydrALAZINE (APRESOLINE) 25 MG tablet TAKE 1 AND 1/2 TABLETS THREE TIMES DAILY 405 tablet 3   HYDROcodone-acetaminophen (NORCO/VICODIN) 5-325 MG tablet Take 1 tablet by mouth every 4 (four) hours as needed for pain. 12 tablet 0   Lancets Misc. (ACCU-CHEK SOFTCLIX LANCET DEV) KIT Check blood sugars 3 times a day. 3 kit 6   latanoprost (XALATAN) 0.005 % ophthalmic solution Place 1 drop into both eyes every evening at bedtime as directed 2.5 mL 6   levocetirizine (XYZAL) 5 MG tablet Take 1 tablet (5 mg total) by mouth every evening. 90 tablet 0   lidocaine (LIDODERM) 5 % PLACE 1 PATCH ONTO THE SKIN DAILY. REMOVE AND DISCARD PATCH WITHIN 12 HOURS OR AS DIRECTED BY MD 90 patch 0   losartan (COZAAR) 50 MG  tablet TAKE 1 TABLET EVERY DAY 90 tablet 3   metFORMIN (GLUCOPHAGE) 500 MG tablet Take 1 tablet (500 mg total) by mouth daily with breakfast. 90 tablet 3   methocarbamol (ROBAXIN) 500 MG tablet Take 1 tablet (500 mg total) by mouth 2 (two) times daily as needed for muscle spasms. 40 tablet 3   metoprolol tartrate (LOPRESSOR) 25 MG tablet TAKE 1/2 TABLET TWICE DAILY 90 tablet 3   montelukast (SINGULAIR) 10 MG tablet Take 1 tablet (10 mg total) by mouth at bedtime. 90 tablet 3   omeprazole (PRILOSEC) 20 MG capsule Take 1 capsule (20 mg total) by mouth daily. 90 capsule 0   rosuvastatin (CRESTOR) 20 MG tablet Take 1 tablet (20 mg total) by mouth daily. 90 tablet 3   solifenacin (VESICARE) 5 MG tablet Take 1 tablet (5 mg total) by mouth daily. 90 tablet 3   tirzepatide (MOUNJARO) 12.5 MG/0.5ML Pen Inject 12.5 mg into the skin once a week. 2 mL 0   traMADol (ULTRAM) 50 MG tablet Take 1 tablet (50 mg total) by mouth every 12 (twelve) hours as needed. 60 tablet 0   triamcinolone cream (KENALOG) 0.1 % Apply 1 Application topically 2 (two) times daily. 30 g 0   vitamin B-12 (CYANOCOBALAMIN) 500 MCG tablet Take 1 tablet (500 mcg total) by mouth daily.     No facility-administered medications prior to visit.         Objective:   Physical Exam Vitals:   02/05/23 0932  BP: 114/66  Pulse: 69  Temp: (!) 97.5 F (36.4 C)  TempSrc: Oral  SpO2: 96%  Weight: 209 lb 9.6 oz (95.1 kg)  Height: 5\' 5"  (1.651  m)   Gen: Pleasant, well-nourished, in no distress,  normal affect  ENT: No lesions,  mouth clear,  oropharynx clear, no postnasal drip  Neck: No JVD, no stridor  Lungs: No use of accessory muscles, no crackles or wheezing on normal respiration, no wheeze on forced expiration  Cardiovascular: RRR, heart sounds normal, no murmur or gallops, no peripheral edema  Musculoskeletal: No deformities, no cyanosis or clubbing  Neuro: alert, awake, non focal  Skin: Warm, no lesions or ras        Assessment & Plan:  Pulmonary nodule 1 cm or greater in diameter Remained stable going back to 2022, larger than 2016.  Suspect this is carcinoid tumor.  I talked to her today about the rare occurrence of a transition to complicated her metastatic carcinoid.  In the end I think she will have to have this removed.  There is no compelling reason to do so immediately.  She is going to think about it.  We will follow her imaging in 1 year.  No clear reason to believe that carcinoids are related to hypercalcemia unless they secrete PTH.  Her PTH has been normal as recently as May 2024.  Chronic cough Discussed strategies with her to help with upper airway irritation.  She will avoid throat clearing, hold off on singing for 2 weeks to try to give her upper airway break.  We will also try to more aggressively treat her GERD and her rhinitis.   Please start loratadine 10 mg once daily line please start fluticasone nasal spray, 2 sprays each nostril once daily. Temporarily increase your omeprazole to 20 mg twice a day for 2 weeks. Try to avoid throat clearing Avoid singing for 2 weeks    Levy Pupa, MD, PhD 02/05/2023, 10:00 AM [facility] [phone] or if no answer before 7:00PM call [phone] For any issues after 7:00PM please call eLink [phone]

## 2023-02-05 NOTE — Assessment & Plan Note (Signed)
Remained stable going back to 2022, larger than 2016.  Suspect this is carcinoid tumor.  I talked to her today about the rare occurrence of a transition to complicated her metastatic carcinoid.  In the end I think she will have to have this removed.  There is no compelling reason to do so immediately.  She is going to think about it.  We will follow her imaging in 1 year.  No clear reason to believe that carcinoids are related to hypercalcemia unless they secrete PTH.  Her PTH has been normal as recently as May 2024.

## 2023-02-05 NOTE — Assessment & Plan Note (Signed)
Discussed strategies with her to help with upper airway irritation.  She will avoid throat clearing, hold off on singing for 2 weeks to try to give her upper airway break.  We will also try to more aggressively treat her GERD and her rhinitis.   Please start loratadine 10 mg once daily line please start fluticasone nasal spray, 2 sprays each nostril once daily. Temporarily increase your omeprazole to 20 mg twice a day for 2 weeks. Try to avoid throat clearing Avoid singing for 2 weeks

## 2023-02-12 ENCOUNTER — Encounter: Payer: Self-pay | Admitting: Internal Medicine

## 2023-02-12 NOTE — Progress Notes (Signed)
Request sent to [facility] for note of last eye exam.

## 2023-02-14 ENCOUNTER — Ambulatory Visit: Payer: Medicare PPO | Attending: Family Medicine

## 2023-02-14 DIAGNOSIS — Z23 Encounter for immunization: Secondary | ICD-10-CM | POA: Diagnosis not present

## 2023-02-14 NOTE — Addendum Note (Signed)
Addended by: Elsie Lincoln F on: 02/14/2023 10:06 AM   Modules accepted: Level of Service

## 2023-02-14 NOTE — Progress Notes (Addendum)
Flu vaccine administered  in right arm per protocols.  Information sheet given. Patient denies and pain or discomfort at injection site. Tolerated injection well no reaction.

## 2023-02-18 ENCOUNTER — Encounter: Payer: Self-pay | Admitting: Internal Medicine

## 2023-02-18 ENCOUNTER — Other Ambulatory Visit: Payer: Self-pay | Admitting: Internal Medicine

## 2023-02-18 DIAGNOSIS — G8929 Other chronic pain: Secondary | ICD-10-CM

## 2023-02-18 MED ORDER — TRAMADOL HCL 50 MG PO TABS
50.0000 mg | ORAL_TABLET | Freq: Two times a day (BID) | ORAL | 0 refills | Status: DC | PRN
Start: 2023-02-18 — End: 2023-05-22

## 2023-02-19 ENCOUNTER — Other Ambulatory Visit (HOSPITAL_COMMUNITY): Payer: Self-pay

## 2023-02-19 ENCOUNTER — Encounter (INDEPENDENT_AMBULATORY_CARE_PROVIDER_SITE_OTHER): Payer: Self-pay | Admitting: Physician Assistant

## 2023-02-19 ENCOUNTER — Ambulatory Visit (INDEPENDENT_AMBULATORY_CARE_PROVIDER_SITE_OTHER): Payer: Medicare PPO | Admitting: Physician Assistant

## 2023-02-19 VITALS — BP 113/76 | HR 65 | Temp 97.5°F | Ht 65.0 in | Wt 205.0 lb

## 2023-02-19 DIAGNOSIS — F39 Unspecified mood [affective] disorder: Secondary | ICD-10-CM

## 2023-02-19 DIAGNOSIS — E559 Vitamin D deficiency, unspecified: Secondary | ICD-10-CM

## 2023-02-19 DIAGNOSIS — E1169 Type 2 diabetes mellitus with other specified complication: Secondary | ICD-10-CM | POA: Diagnosis not present

## 2023-02-19 DIAGNOSIS — K5900 Constipation, unspecified: Secondary | ICD-10-CM | POA: Diagnosis not present

## 2023-02-19 DIAGNOSIS — G629 Polyneuropathy, unspecified: Secondary | ICD-10-CM

## 2023-02-19 DIAGNOSIS — I2510 Atherosclerotic heart disease of native coronary artery without angina pectoris: Secondary | ICD-10-CM | POA: Diagnosis not present

## 2023-02-19 DIAGNOSIS — Z6834 Body mass index (BMI) 34.0-34.9, adult: Secondary | ICD-10-CM

## 2023-02-19 DIAGNOSIS — E669 Obesity, unspecified: Secondary | ICD-10-CM

## 2023-02-19 DIAGNOSIS — Z7985 Long-term (current) use of injectable non-insulin antidiabetic drugs: Secondary | ICD-10-CM

## 2023-02-19 DIAGNOSIS — F5089 Other specified eating disorder: Secondary | ICD-10-CM | POA: Diagnosis not present

## 2023-02-19 DIAGNOSIS — K5903 Drug induced constipation: Secondary | ICD-10-CM

## 2023-02-19 DIAGNOSIS — G622 Polyneuropathy due to other toxic agents: Secondary | ICD-10-CM

## 2023-02-19 MED ORDER — BUPROPION HCL ER (SR) 200 MG PO TB12
200.0000 mg | ORAL_TABLET | Freq: Two times a day (BID) | ORAL | 0 refills | Status: DC
Start: 2023-02-19 — End: 2023-03-05

## 2023-02-19 MED ORDER — TIRZEPATIDE 12.5 MG/0.5ML ~~LOC~~ SOAJ
12.5000 mg | SUBCUTANEOUS | 0 refills | Status: DC
Start: 2023-02-19 — End: 2023-03-26
  Filled 2023-02-19: qty 2, 28d supply, fill #0

## 2023-02-19 NOTE — Progress Notes (Signed)
.smr  Office: [phone]  /  Fax: [phone]  WEIGHT SUMMARY AND BIOMETRICS  Vitals Temp: (!) 97.5 F (36.4 C) BP: 113/76 Pulse Rate: 65 SpO2: 91 %   Anthropometric Measurements Height: 5\' 5"  (1.651 m) Weight: 205 lb (93 kg) BMI (Calculated): 34.11 Weight at Last Visit: 207 lb Weight Lost Since Last Visit: 2 lb Weight Gained Since Last Visit: 0 Starting Weight: 228 lb Total Weight Loss (lbs): 23 lb (10.4 kg) Peak Weight: 228 lb   Body Composition  Body Fat %: 40.7 % Fat Mass (lbs): 83.6 lbs Muscle Mass (lbs): 115.4 lbs Total Body Water (lbs): 74.4 lbs Visceral Fat Rating : 11   Other Clinical Data Fasting: yes Labs: no Today's Visit #: 67 Starting Date: 02/15/20     HPI  Chief Complaint: OBESITY  [name] is here to discuss her progress with her obesity treatment plan. She is on the keeping a food journal and adhering to recommended goals of 1000-1100 calories and 75 grams  protein and states she is following her eating plan approximately 50 % of the time. She states she is exercising silver sneakers 50 minutes 3 times per week.  Discussed the use of AI scribe software for clinical note transcription with the patient, who gave verbal consent to proceed.  History of Present Illness  /  Interval History:  Since last office visit she down 2 lbs.  The patient, with a history of ovarian cancer and diabetes, presents with concerns about her current medication regimen for obesity treatment. She reports intermittent nausea after taking Mounjaro which she believes may be related to her dietary habits. She also expresses dissatisfaction with gabapentin, which she takes for peripheral neuropathy, stating that it does not alleviate her symptoms of burning and pain in her feet.  In addition to her personal health concerns, the patient is also dealing with significant family health issues. Her son recently underwent emergency surgery for a stomach issue, resulting in a  colostomy. Her husband was also recently discovered to have had a heart attack. These events have caused some disruption in the patient's routine and may have affected her sleep and eating habits.  The patient is proactive about her health, engaging in regular exercise through a program -[employer]neakers. She is considering working with a trainer to build muscle mass, as recommended by another healthcare provider. She also expresses a preference for vegetables over fruits in her diet.  Pharmacotherapy: Mounjaro 12.5 mg weekly. Denies mass in neck, dysphagia, dyspepsia, persistent hoarseness, abdominal pain, or V/Constipation or diarrhea. Has annual eye exam. Mood is stable. Some nausea with Mounjaro, but may be related to specific foods- increased fatty foods or simple carbs.    TREATMENT PLAN FOR OBESITY:  Recommended Dietary Goals  [name] is currently in the action stage of change. As such, her goal is to continue weight management plan. She has agreed to the Category 1 Plan and keeping a food journal and adhering to recommended goals of 1000-1100 calories and 75 grams of  protein.  Behavioral Intervention  We discussed the following Behavioral Modification Strategies today: increasing lean protein intake, decreasing simple carbohydrates , increasing vegetables, increasing lower glycemic fruits, increasing fiber rich foods, avoiding skipping meals, increasing water intake, decreasing eating out or consumption of processed foods, and making healthy choices when eating convenient foods, emotional eating strategies and understanding the difference between hunger signals and cravings, work on managing stress, creating time for self-care and relaxation measures, continue to practice mindfulness when eating, and planning  for success.  Additional resources provided today: NA  Recommended Physical Activity Goals  [name] has been advised to work up to 150 minutes of moderate intensity aerobic activity  a week and strengthening exercises 2-3 times per week for cardiovascular health, weight loss maintenance and preservation of muscle mass.   She has agreed to Continue current level of physical activity  and Think about ways to increase daily physical activity and overcoming barriers to exercise   Pharmacotherapy We discussed various medication options to help [name] with her weight loss efforts and we both agreed to continue Mounjaro 12.5 mg weekly.    No follow-ups on file.Marland Kitchen She was informed of the importance of frequent follow up visits to maximize her success with intensive lifestyle modifications for her multiple health conditions.  PHYSICAL EXAM:  Blood pressure 113/76, pulse 65, temperature (!) 97.5 F (36.4 C), height 5\' 5"  (1.651 m), weight 205 lb (93 kg), SpO2 91%. Body mass index is 34.11 kg/m.  General: She is overweight, cooperative, alert, well developed, and in no acute distress. PSYCH: Has normal mood, affect and thought process.   Lungs: Normal breathing effort, no conversational dyspnea.  DIAGNOSTIC DATA REVIEWED:  BMET    Component Value Date/Time   NA 141 06/21/2022 1616   NA 138 04/22/2016 0750   K 4.2 06/21/2022 1616   K 3.9 04/22/2016 0750   CL 102 06/21/2022 1616   CO2 18 (L) 06/21/2022 1616   CO2 26 04/22/2016 0750   GLUCOSE 102 (H) 06/21/2022 1616   GLUCOSE 81 03/28/2022 1508   GLUCOSE 142 (H) 04/22/2016 0750   BUN 20 06/21/2022 1616   BUN 19.1 11/20/2016 1355   CREATININE 1.22 (H) 06/21/2022 1616   CREATININE 1.26 (H) 12/29/2019 1015   CREATININE 1.1 11/20/2016 1355   CALCIUM 12.2 (H) 06/21/2022 1616   CALCIUM 9.8 04/22/2016 0750   GFRNONAA 47 (L) 03/28/2022 1508   GFRNONAA 48 (L) 12/29/2019 1015   GFRNONAA 63 07/08/2014 1212   GFRAA 69 04/17/2020 0928   GFRAA 56 (L) 12/29/2019 1015   GFRAA 73 07/08/2014 1212   Lab Results  Component Value Date   HGBA1C 6.3 01/20/2023   HGBA1C 10.1 (H) 07/07/2015   No results found for: "INSULIN" Lab  Results  Component Value Date   TSH 1.370 06/21/2022   CBC    Component Value Date/Time   WBC 5.4 03/28/2022 1508   RBC 4.97 03/28/2022 1508   HGB 15.0 03/28/2022 1508   HGB 13.3 02/03/2020 0837   HGB 13.6 12/04/2018 0914   HGB 12.9 04/22/2016 0750   HCT 46.8 (H) 03/28/2022 1508   HCT 41.0 12/04/2018 0914   HCT 39.0 04/22/2016 0750   PLT 318 03/28/2022 1508   PLT 270 02/03/2020 0837   PLT 332 12/04/2018 0914   MCV 94.2 03/28/2022 1508   MCV 88 12/04/2018 0914   MCV 92.2 04/22/2016 0750   MCH 30.2 03/28/2022 1508   MCHC 32.1 03/28/2022 1508   RDW 13.6 03/28/2022 1508   RDW 13.6 12/04/2018 0914   RDW 13.9 04/22/2016 0750   Iron Studies    Component Value Date/Time   IRON 43 08/18/2010 0515   TIBC 389 08/18/2010 0515   FERRITIN 227 08/18/2010 0515   IRONPCTSAT 11 (L) 08/18/2010 0515   Lipid Panel     Component Value Date/Time   CHOL 161 01/20/2023 1032   TRIG 183 (H) 01/20/2023 1032   HDL 46 01/20/2023 1032   CHOLHDL 3.5 01/20/2023 1032   CHOLHDL 5.1 (  H) 08/14/2016 1729   VLDL 74 (H) 08/14/2016 1729   LDLCALC 84 01/20/2023 1032   Hepatic Function Panel     Component Value Date/Time   PROT 7.7 10/21/2022 1216   PROT 8.2 04/22/2016 0750   ALBUMIN 4.6 01/09/2022 0855   ALBUMIN 3.8 04/22/2016 0750   AST 26 01/09/2022 0855   AST 29 12/29/2019 1015   AST 27 04/22/2016 0750   ALT 29 01/09/2022 0855   ALT 33 12/29/2019 1015   ALT 24 04/22/2016 0750   ALKPHOS 74 01/09/2022 0855   ALKPHOS 74 04/22/2016 0750   BILITOT 0.5 01/09/2022 0855   BILITOT 0.6 12/29/2019 1015   BILITOT 0.44 04/22/2016 0750   BILIDIR 0.2 08/18/2010 0515   IBILI 1.5 (H) 08/18/2010 0515      Component Value Date/Time   TSH 1.370 06/21/2022 1616   Nutritional Lab Results  Component Value Date   VD25OH 29.26 (L) 10/21/2022   VD25OH 31.2 06/21/2022   VD25OH 35.7 10/22/2021    ASSOCIATED CONDITIONS ADDRESSED TODAY  ASSESSMENT AND PLAN  Problem List Items Addressed This Visit      Generalized obesity   Vitamin D deficiency   Mood disorder (HCC) with emotional eating   Type 2 diabetes mellitus with obesity (HCC) - Primary  Diabetes Stable blood sugars with occasional nausea after taking Mounjaro. Discussed the importance of diet and hydration when taking Mounjaro. -Continue Mounjaro 12.5 mg weekly as prescribed. -Check blood sugars regularly. -Stay hydrated and maintain a balanced diet.  Muscle Mass Patient is actively working on increasing muscle mass through exercise and is considering working with a trainer. -Continue regular exercise with Silver Sneakers. -Consider working with a trainer to focus on strength building.  Constipation Patient reports infrequent bowel movements. -Start MiraLAX 17g daily until regular bowel movements are established, then adjust as needed.  Peripheral Neuropathy Patient reports burning sensation in toes and feet, currently on Gabapentin but reports it is not helping. -Consult with primary care provider regarding the effectiveness of Gabapentin and possible alternatives.   Emotional Eating: On Wellbutrin. No side effects. Feels helpful with EEB Plan: Continue/Refill Wellbutrin 200 mg twice daily   Coronary Artery Calcification Stable, no current symptoms of chest pain or shortness of breath. -Continue current management plan.  Follow-up Patient has upcoming appointments with primary care provider and endocrinologist. -Attend scheduled follow-up appointments.  ATTESTASTION STATEMENTS:  Reviewed by clinician on day of visit: allergies, medications, problem list, medical history, surgical history, family history, social history, and previous encounter notes.   I have personally spent 30 minutes total time today in preparation, patient care, nutritional counseling and documentation for this visit, including the following: review of clinical lab tests; review of medical tests/procedures/services.      Sarah Achorn,  PA-C

## 2023-02-22 DIAGNOSIS — K5903 Drug induced constipation: Secondary | ICD-10-CM | POA: Insufficient documentation

## 2023-02-22 DIAGNOSIS — G622 Polyneuropathy due to other toxic agents: Secondary | ICD-10-CM | POA: Insufficient documentation

## 2023-03-02 ENCOUNTER — Other Ambulatory Visit: Payer: Self-pay | Admitting: Internal Medicine

## 2023-03-02 DIAGNOSIS — E1169 Type 2 diabetes mellitus with other specified complication: Secondary | ICD-10-CM

## 2023-03-03 MED ORDER — ACCU-CHEK GUIDE W/DEVICE KIT: PACK | 0 refills | Status: DC

## 2023-03-03 MED ORDER — ACCU-CHEK GUIDE VI STRP: ORAL_STRIP | 6 refills | Status: DC

## 2023-03-03 MED ORDER — ACCU-CHEK SOFTCLIX LANCETS MISC: 6 refills | Status: DC

## 2023-03-05 ENCOUNTER — Ambulatory Visit (INDEPENDENT_AMBULATORY_CARE_PROVIDER_SITE_OTHER): Payer: Medicare PPO | Admitting: Family Medicine

## 2023-03-05 DIAGNOSIS — Z9109 Other allergy status, other than to drugs and biological substances: Secondary | ICD-10-CM

## 2023-03-05 DIAGNOSIS — Z7985 Long-term (current) use of injectable non-insulin antidiabetic drugs: Secondary | ICD-10-CM | POA: Diagnosis not present

## 2023-03-05 DIAGNOSIS — E669 Obesity, unspecified: Secondary | ICD-10-CM

## 2023-03-05 DIAGNOSIS — K5903 Drug induced constipation: Secondary | ICD-10-CM

## 2023-03-05 DIAGNOSIS — F39 Unspecified mood [affective] disorder: Secondary | ICD-10-CM | POA: Diagnosis not present

## 2023-03-05 DIAGNOSIS — Z7984 Long term (current) use of oral hypoglycemic drugs: Secondary | ICD-10-CM | POA: Diagnosis not present

## 2023-03-05 DIAGNOSIS — Z6834 Body mass index (BMI) 34.0-34.9, adult: Secondary | ICD-10-CM

## 2023-03-05 DIAGNOSIS — E1169 Type 2 diabetes mellitus with other specified complication: Secondary | ICD-10-CM

## 2023-03-05 MED ORDER — LEVOCETIRIZINE DIHYDROCHLORIDE 5 MG PO TABS
5.0000 mg | ORAL_TABLET | Freq: Every evening | ORAL | 0 refills | Status: DC
Start: 2023-03-05 — End: 2023-05-07

## 2023-03-05 MED ORDER — BUPROPION HCL ER (SR) 200 MG PO TB12
200.0000 mg | ORAL_TABLET | Freq: Two times a day (BID) | ORAL | 0 refills | Status: DC
Start: 2023-03-05 — End: 2023-03-26

## 2023-03-05 NOTE — Progress Notes (Signed)
[name], D.O.  ABFM, ABOM Specializing in Clinical Bariatric Medicine  Office located at: [address] [place], [place]  [address]     Assessment and Plan:   Medications Discontinued During This Encounter  Medication Reason   loratadine (CLARITIN) 10 MG tablet    levocetirizine (XYZAL) 5 MG tablet Reorder   buPROPion (WELLBUTRIN SR) 200 MG 12 hr tablet Reorder    Meds ordered this encounter  Medications   levocetirizine (XYZAL) 5 MG tablet    Sig: Take 1 tablet (5 mg total) by mouth every evening.    Dispense:  90 tablet    Refill:  0   buPROPion (WELLBUTRIN SR) 200 MG 12 hr tablet    Sig: Take 1 tablet (200 mg total) by mouth 2 (two) times daily.    Dispense:  60 tablet    Refill:  0     Drug-induced constipation Assessment & Plan: Blood sugars at home are running 91-100s and have been stable. She endorses nausea and constipation, which she attributes to her Mounjaro. Of note, the nausea is worse than her constipation. Tends to take Mounjaro at nighttime. No symptoms when she eats on plan before and 24 hours after her injection.    Environmental allergies Assessment & Plan: Sarah [name] complains of a constant cough throughout the day. Per pt she has to keep cough drops on her and avoid throat clearing In the past we had her on xyzal and singulair with good control of symptoms. She has been on Claritin for 1 month, was changed by Dr. Delton Coombes. Pt feels the Claritin was moderately helping, but xyzal was working better. We mutually agreed Pt would discontinue claritin and resume xyzal. Continue singulair and flonase per Dr. Delton Coombes. Will refill Xyzal and today.   We discussed the possibility of her dry cough being associated with GERD. She is on omeprazole BID per Dr. Delton Coombes -- once in the morning and once at noon.   Type 2 diabetes mellitus with obesity [facility] Assessment & Plan: Lab Results  Component Value Date   HGBA1C 6.3 01/20/2023   HGBA1C 6.0 09/17/2022    HGBA1C 5.5 05/17/2022    On 01/16/23, we increased her Wellbutrin to 200 mg BID. Pt has been compliant with Wellbutrin for 2 months now, taking in the mornings and noon. I advised Pt to focus on increasing exercise for stress management. C/w Wellbutrin at current dose and reevaluate this regimen in about 6 weeks. Will refill Wellbutrin today.    Morbid obesity (HCC)-starting bmi 37.94 BMI 34.0-34.9,adult- current BMI 34.28 Assessment & Plan: Sarah [name] is here to discuss her progress with her obesity treatment plan along with follow-up of her obesity related diagnoses. See Medical Weight Management Flowsheet for complete bioelectrical impedance results.  Since last office visit on 02/19/23 patient's muscle mass has decreased by 1 lb. Fat mass has increased by 2.61 lbs. Total body water has increased by 3.8 lbs.  Counseling done on how various foods will affect these numbers and how to maximize success  Total lbs lost to date: 22 Total weight loss percentage to date: -9.65   Behavioral Intervention Additional resources provided today: N/a Evidence-based interventions for health behavior change were utilized today including the discussion of self monitoring techniques, problem-solving barriers and SMART goal setting techniques.   Regarding patient's less desirable eating habits and patterns, we employed the technique of small changes.  Pt will specifically work on: Increasing exercise for stress management, moving at least 5 days  a week for next visit.    FOLLOW UP: Return in about 3 weeks (around 03/26/2023). She was informed of the importance of frequent follow up visits to maximize her success with intensive lifestyle modifications for her multiple health conditions.  Subjective:   Chief complaint: Obesity Sarah [name] is here to discuss her progress with her obesity treatment plan. She is on the keeping a food journal and adhering to recommended goals of 1000-1100 calories and 75+ g of  protein and states she is following her eating plan approximately 60% of the time. She states she is participating in [employer] for 50 minutes 3 days per week.  Interval History:  Sarah [name] is here for a follow up office visit. Since last OV, she states her son had emergency surgery (is wearing a colostomy bag) and has since been unable to focus on herself. Due to this increased stress, she has not been able to cook much and has been eating out often - take out or fast food. Recently she has gotten better at eating at home an eating foods she has to prepare for her son, such as salads and fish. She endorses skipping meals and decreased hunger. Is drinking plenty water. Husband also recently diagnosed with rectal cancer.   Pharmacotherapy for weight loss: She is currently taking  Metformin, Mounjaro, and Wellbutrin  for medical weight loss.  Denies side effects.    Review of Systems:  Pertinent positives were addressed with patient today.  Reviewed by clinician on day of visit: allergies, medications, problem list, medical history, surgical history, family history, social history, and previous encounter notes.  Weight Summary and Biometrics   Weight Lost Since Last Visit: 0lb  Weight Gained Since Last Visit: 1lb   Vitals Temp: 98.1 F (36.7 C) BP: 116/74 Pulse Rate: 71 SpO2: 95 %   Anthropometric Measurements Height: 5\' 5"  (1.651 m) Weight: 206 lb (93.4 kg) BMI (Calculated): 34.28 Weight at Last Visit: 205lb Weight Lost Since Last Visit: 0lb Weight Gained Since Last Visit: 1lb Starting Weight: 228lb Total Weight Loss (lbs): 22 lb (9.979 kg) Peak Weight: 228lb   Body Composition  Body Fat %: 41.7 % Fat Mass (lbs): 86.21 lbs Muscle Mass (lbs): 114.4 lbs Total Body Water (lbs): 78.2 lbs Visceral Fat Rating : 11   Other Clinical Data Fasting: no Labs: no Today's Visit #: 69 Starting Date: 02/15/20    Objective:   PHYSICAL EXAM: Blood pressure 116/74,  pulse 71, temperature 98.1 F (36.7 C), height 5\' 5"  (1.651 m), weight 206 lb (93.4 kg), SpO2 95%. Body mass index is 34.28 kg/m.  General: Well Developed, well nourished, and in no acute distress.  HEENT: Normocephalic, atraumatic Skin: Warm and dry, cap RF less 2 sec, good turgor Chest:  Normal excursion, shape, no gross abn Respiratory: speaking in full sentences, no conversational dyspnea NeuroM-Sk: Ambulates w/o assistance, moves * 4 Psych: A and O *3, insight good, mood-full  DIAGNOSTIC DATA REVIEWED:  BMET    Component Value Date/Time   NA 141 06/21/2022 1616   NA 138 04/22/2016 0750   K 4.2 06/21/2022 1616   K 3.9 04/22/2016 0750   CL 102 06/21/2022 1616   CO2 18 (L) 06/21/2022 1616   CO2 26 04/22/2016 0750   GLUCOSE 102 (H) 06/21/2022 1616   GLUCOSE 81 03/28/2022 1508   GLUCOSE 142 (H) 04/22/2016 0750   BUN 20 06/21/2022 1616   BUN 19.1 11/20/2016 1355   CREATININE 1.22 (H) 06/21/2022 1616   CREATININE  1.26 (H) 12/29/2019 1015   CREATININE 1.1 11/20/2016 1355   CALCIUM 12.2 (H) 06/21/2022 1616   CALCIUM 9.8 04/22/2016 0750   GFRNONAA 47 (L) 03/28/2022 1508   GFRNONAA 48 (L) 12/29/2019 1015   GFRNONAA 63 07/08/2014 1212   GFRAA 69 04/17/2020 0928   GFRAA 56 (L) 12/29/2019 1015   GFRAA 73 07/08/2014 1212   Lab Results  Component Value Date   HGBA1C 6.3 01/20/2023   HGBA1C 10.1 (H) 07/07/2015   No results found for: "INSULIN" Lab Results  Component Value Date   TSH 1.370 06/21/2022   CBC    Component Value Date/Time   WBC 5.4 03/28/2022 1508   RBC 4.97 03/28/2022 1508   HGB 15.0 03/28/2022 1508   HGB 13.3 02/03/2020 0837   HGB 13.6 12/04/2018 0914   HGB 12.9 04/22/2016 0750   HCT 46.8 (H) 03/28/2022 1508   HCT 41.0 12/04/2018 0914   HCT 39.0 04/22/2016 0750   PLT 318 03/28/2022 1508   PLT 270 02/03/2020 0837   PLT 332 12/04/2018 0914   MCV 94.2 03/28/2022 1508   MCV 88 12/04/2018 0914   MCV 92.2 04/22/2016 0750   MCH 30.2 03/28/2022 1508    MCHC 32.1 03/28/2022 1508   RDW 13.6 03/28/2022 1508   RDW 13.6 12/04/2018 0914   RDW 13.9 04/22/2016 0750   Iron Studies    Component Value Date/Time   IRON 43 08/18/2010 0515   TIBC 389 08/18/2010 0515   FERRITIN 227 08/18/2010 0515   IRONPCTSAT 11 (L) 08/18/2010 0515   Lipid Panel     Component Value Date/Time   CHOL 161 01/20/2023 1032   TRIG 183 (H) 01/20/2023 1032   HDL 46 01/20/2023 1032   CHOLHDL 3.5 01/20/2023 1032   CHOLHDL 5.1 (H) 08/14/2016 1729   VLDL 74 (H) 08/14/2016 1729   LDLCALC 84 01/20/2023 1032   Hepatic Function Panel     Component Value Date/Time   PROT 7.7 10/21/2022 1216   PROT 8.2 04/22/2016 0750   ALBUMIN 4.6 01/09/2022 0855   ALBUMIN 3.8 04/22/2016 0750   AST 26 01/09/2022 0855   AST 29 12/29/2019 1015   AST 27 04/22/2016 0750   ALT 29 01/09/2022 0855   ALT 33 12/29/2019 1015   ALT 24 04/22/2016 0750   ALKPHOS 74 01/09/2022 0855   ALKPHOS 74 04/22/2016 0750   BILITOT 0.5 01/09/2022 0855   BILITOT 0.6 12/29/2019 1015   BILITOT 0.44 04/22/2016 0750   BILIDIR 0.2 08/18/2010 0515   IBILI 1.5 (H) 08/18/2010 0515      Component Value Date/Time   TSH 1.370 06/21/2022 1616   Nutritional Lab Results  Component Value Date   VD25OH 29.26 (L) 10/21/2022   VD25OH 31.2 06/21/2022   VD25OH 35.7 10/22/2021    Attestations:   I, Isabelle Course, acting as a Stage manager for Thomasene Lot, DO., have compiled all relevant documentation for today's office visit on behalf of Thomasene Lot, DO, while in the presence of [employer], DO.  I have reviewed the above documentation for accuracy and completeness, and I agree with the above. [name], D.O.  The 21st Century Cures Act was signed into law in 2016 which includes the topic of electronic health records.  This provides immediate access to information in MyChart.  This includes consultation notes, operative notes, office notes, lab results and pathology reports.  If you have any  questions about what you read please let us know at your next visit so  we can discuss your concerns and take corrective action if need be.  We are right here with you.

## 2023-03-12 ENCOUNTER — Encounter: Payer: Self-pay | Admitting: Internal Medicine

## 2023-03-20 ENCOUNTER — Telehealth: Payer: Self-pay | Admitting: Internal Medicine

## 2023-03-20 NOTE — Telephone Encounter (Signed)
Copied from CRM [id number]. Topic: General - Inquiry >> Mar 20, 2023  8:53 AM Sarah [name] wrote: Reason for CRM: pt wants to know if her handicapp placard paperwork is ready to be picked up.

## 2023-03-21 NOTE — Telephone Encounter (Signed)
Called but no answer. LVM to call back. Please inform patient that forms are ready for pick-up.

## 2023-03-24 NOTE — Telephone Encounter (Signed)
Called & spoke to the patient. Verified name & DOB. Informed that form is ready for pick-up. Patient stated that she already picked up form today 03/24/23. No further assistance needed at this time.

## 2023-03-26 ENCOUNTER — Other Ambulatory Visit (HOSPITAL_COMMUNITY): Payer: Self-pay

## 2023-03-26 ENCOUNTER — Ambulatory Visit (INDEPENDENT_AMBULATORY_CARE_PROVIDER_SITE_OTHER): Payer: Medicare PPO | Admitting: Family Medicine

## 2023-03-26 ENCOUNTER — Other Ambulatory Visit: Payer: Self-pay | Admitting: Pharmacist

## 2023-03-26 ENCOUNTER — Encounter (INDEPENDENT_AMBULATORY_CARE_PROVIDER_SITE_OTHER): Payer: Self-pay | Admitting: Family Medicine

## 2023-03-26 VITALS — BP 128/82 | HR 72 | Temp 97.5°F | Ht 65.0 in | Wt 201.0 lb

## 2023-03-26 DIAGNOSIS — Z7984 Long term (current) use of oral hypoglycemic drugs: Secondary | ICD-10-CM

## 2023-03-26 DIAGNOSIS — K219 Gastro-esophageal reflux disease without esophagitis: Secondary | ICD-10-CM | POA: Diagnosis not present

## 2023-03-26 DIAGNOSIS — E1169 Type 2 diabetes mellitus with other specified complication: Secondary | ICD-10-CM | POA: Diagnosis not present

## 2023-03-26 DIAGNOSIS — E669 Obesity, unspecified: Secondary | ICD-10-CM

## 2023-03-26 DIAGNOSIS — Z6833 Body mass index (BMI) 33.0-33.9, adult: Secondary | ICD-10-CM

## 2023-03-26 DIAGNOSIS — Z794 Long term (current) use of insulin: Secondary | ICD-10-CM | POA: Diagnosis not present

## 2023-03-26 DIAGNOSIS — F39 Unspecified mood [affective] disorder: Secondary | ICD-10-CM

## 2023-03-26 DIAGNOSIS — T887XXA Unspecified adverse effect of drug or medicament, initial encounter: Secondary | ICD-10-CM | POA: Diagnosis not present

## 2023-03-26 DIAGNOSIS — Z7985 Long-term (current) use of injectable non-insulin antidiabetic drugs: Secondary | ICD-10-CM

## 2023-03-26 DIAGNOSIS — F5089 Other specified eating disorder: Secondary | ICD-10-CM | POA: Diagnosis not present

## 2023-03-26 MED ORDER — OMEPRAZOLE 20 MG PO CPDR
20.0000 mg | DELAYED_RELEASE_CAPSULE | Freq: Every day | ORAL | 0 refills | Status: DC
Start: 2023-03-26 — End: 2023-06-25

## 2023-03-26 MED ORDER — TIRZEPATIDE 12.5 MG/0.5ML ~~LOC~~ SOAJ
12.5000 mg | SUBCUTANEOUS | 0 refills | Status: DC
  Filled 2023-03-26: qty 2, 28d supply, fill #0

## 2023-03-26 MED ORDER — ONDANSETRON HCL 4 MG PO TABS
ORAL_TABLET | ORAL | 0 refills | Status: DC
Start: 2023-03-26 — End: 2024-04-06

## 2023-03-26 MED ORDER — BUPROPION HCL ER (SR) 200 MG PO TB12
200.0000 mg | ORAL_TABLET | Freq: Two times a day (BID) | ORAL | 0 refills | Status: DC
Start: 2023-03-26 — End: 2023-05-07

## 2023-03-26 NOTE — Progress Notes (Signed)
Pharmacy Quality Measure Review  This patient is appearing on a report for being at risk of failing the adherence measure for cholesterol (statin), diabetes, and hypertension (ACEi/ARB) medications this calendar year.   Medication: rosuvastatin Last fill date: 01/21/2023 for 90 day supply  Insurance report was not up to date. No action needed at this time.    Medication: metformin Last fill date: 01/21/2023 for 90 day supply  Insurance report was not up to date. No action needed at this time.    Medication: losartan Last fill date: 03/03/23 for 90 day supply  Insurance report was not up to date. No action needed at this time.    Butch Penny, PharmD, P[place]y [place], CPP Clinical Pharmacist [facility] [phone]

## 2023-03-26 NOTE — Progress Notes (Signed)
Sarah [name], D.O.  ABFM, ABOM Specializing in Clinical Bariatric Medicine  Office located at: [address] [place], [place]  [address]     Assessment and Plan:   Medications Discontinued During This Encounter  Medication Reason   Accu-Chek Softclix Lancets lancets Duplicate   omeprazole (PRILOSEC) 20 MG capsule Reorder   tirzepatide (MOUNJARO) 12.5 MG/0.5ML Pen Reorder   buPROPion (WELLBUTRIN SR) 200 MG 12 hr tablet Reorder    Meds ordered this encounter  Medications   buPROPion (WELLBUTRIN SR) 200 MG 12 hr tablet    Sig: Take 1 tablet (200 mg total) by mouth 2 (two) times daily.    Dispense:  60 tablet    Refill:  0   tirzepatide (MOUNJARO) 12.5 MG/0.5ML Pen    Sig: Inject 12.5 mg into the skin once a week.    Dispense:  2 mL    Refill:  0   ondansetron (ZOFRAN) 4 MG tablet    Sig: Take 1 tablet by mouth every 6 hours as needed severe nausea    Dispense:  30 tablet    Refill:  0   omeprazole (PRILOSEC) 20 MG capsule    Sig: Take 1 capsule (20 mg total) by mouth daily.    Dispense:  90 capsule    Refill:  0    Future refills per GI doc- [facility]    Depressed mood (HCC) with emotional eating Assessment & Plan: Pt is taking Wellbutrin 200 mg daily. Mood is stable and well controlled. Has a tendency to snack even when not hungry and reports being able to catch herself and stop. Stressed the importance of not snacking if she is not finishing her meals. Continue on current regimen to manage her condition.  Will refill Wellbutrin 200 mg today.    Type 2 diabetes mellitus with obesity (HCC) Type 2 diabetes mellitus with other specified complication, with long-term current use of insulin (HCC) Assessment & Plan: Pt is taking Metformin 500 mg daily and Mounjaro 12.5 mg once weekly to manage her diabetes. She has been making sure to eat on goal at least the day before and after Moujaro to avoid side effects. Monitors her sugars at home, reports an average in 90s and  highest reading of 110. She has been participating in a church wide fast and is only eating one meal a day recently. States she is making sure to eat sufficient amounts of protein . (chicken) when she does eat to ensure she "makes up" for missed meals. Stressed the importance of staying hydrates and encourage pt to increase her daily water intake.   I recommend ensuring she eats all her meals prior to snacking. Continue to work on exercise and maintaining high walking intensity. I encourage she try to increase walking duration and frequency and start weight lifting. We will continue to monitor her condition alongside her PCP/specialists. Will refill Mounjaro 12.5 mg today.    Medication side effect Assessment & Plan: Pt previously endorsed drug-indused nausea and constipation on Mounjaro. Taking zofran 4 mg to help. She has been eating on plan the day before and after receiving her Mounjaro dose to avoid any side effects, which has helped reduce her symptoms. States she has been able to eat well on those days. Will continue to monitor. Continue on Mounjaro at prescribed dose. Will refill Zofran 4 mg.    Gastroesophageal reflux disease, unspecified whether esophagitis present Assessment & Plan: Pt is managing her GERD with omeprazole 20 mg. She has  been taking twice daily at the recommendation of Dr. Delton Coombes of pulmonology per note on 02/05/2023. Continue with this regimen per specialist. We will monitor her condition as it relates to her weight loss journey. Will refer omeprazole 20 mg daily today.    Obesity with starting BMI 37.9/date 02/15/2020 BMI 33.0-33.9,adult- current bmi 33.45 Assessment & Plan: Sarah [name] is here to discuss her progress with her obesity treatment plan along with follow-up of her obesity related diagnoses. See Medical Weight Management Flowsheet for complete bioelectrical impedance results.  Since last office visit on 03/05/23 patient's muscle mass has increased by 1.2  lbs. Fat mass has decreased by 6.41 lbs. Total body water has decreased by 4.4 lbs.  Counseling done on how various foods will affect these numbers and how to maximize success  Total lbs lost to date: 27 lbs Total weight loss percentage to date: -11.84 %   No change to meal plan - see Subjective   Behavioral Intervention Additional resources provided today: n/a Evidence-based interventions for health behavior change were utilized today including the discussion of self monitoring techniques, problem-solving barriers and SMART goal setting techniques.   Regarding patient's less desirable eating habits and patterns, we employed the technique of small changes.  Pt will specifically work on: try to walk for 30-60 minutes for 5 days a week for next visit.    FOLLOW UP: Return in about 2 weeks (around 04/09/2023). She was informed of the importance of frequent follow up visits to maximize her success with intensive lifestyle modifications for her multiple health conditions.  Subjective:   Chief complaint: Obesity Sarah [name] is here to discuss her progress with her obesity treatment plan. She is on the the Category 1 Plan adhering to 1000-1100 calories and 75+ g of protein and states she is following her eating plan approximately 75% of the time. She states she is exercising aerobics for 50 minutes 4 days a week and walking 30 minutes 2 days per week.  Interval History:  Sarah [name] is here for a follow up office visit. Since last OV, she is doing overall well. She reports hunger and cravings are not well controlled. Tends to snack even though she is not hungry, stating it is more of a habit. Has to catch herself and say "I'm not hungry, I just ate", and will be able to put her snack (chips) away. Her choice of meat tends to be chicken and fish. Is drinking more water.    Pharmacotherapy for weight loss: She is currently taking  Wellbutrin 200 mg, Metformin 500 mg, and Mounjaro 12.5 mg  for medical  weight loss.  Denies side effects.    Review of Systems:  Pertinent positives were addressed with patient today.  Reviewed by clinician on day of visit: allergies, medications, problem list, medical history, surgical history, family history, social history, and previous encounter notes.  Weight Summary and Biometrics   Weight Lost Since Last Visit: 5  Weight Gained Since Last Visit: 1    Vitals Temp: (!) 97.5 F (36.4 C) BP: 128/82 Pulse Rate: 72 SpO2: 100 %   Anthropometric Measurements Height: 5\' 5"  (1.651 m) Weight: 201 lb (91.2 kg) BMI (Calculated): 33.45 Weight at Last Visit: 206lb Weight Lost Since Last Visit: 5 Weight Gained Since Last Visit: 1 Starting Weight: 228lb Total Weight Loss (lbs): 27 lb (12.2 kg) Peak Weight: 228lb   Body Composition  Body Fat %: 39.6 % Fat Mass (lbs): 79.8 lbs Muscle Mass (lbs): 115.6 lbs Total  Body Water (lbs): 73.8 lbs Visceral Fat Rating : 11   Other Clinical Data Fasting: no Labs: no Today's Visit #: 70 Starting Date: 02/15/20    Objective:   PHYSICAL EXAM: Blood pressure 128/82, pulse 72, temperature (!) 97.5 F (36.4 C), height 5\' 5"  (1.651 m), weight 201 lb (91.2 kg), SpO2 100%. Body mass index is 33.45 kg/m.  General: Well Developed, well nourished, and in no acute distress.  HEENT: Normocephalic, atraumatic Skin: Warm and dry, cap RF less 2 sec, good turgor Chest:  Normal excursion, shape, no gross abn Respiratory: speaking in full sentences, no conversational dyspnea NeuroM-Sk: Ambulates w/o assistance, moves * 4 Psych: A and O *3, insight good, mood-full  DIAGNOSTIC DATA REVIEWED:  BMET    Component Value Date/Time   NA 141 06/21/2022 1616   NA 138 04/22/2016 0750   K 4.2 06/21/2022 1616   K 3.9 04/22/2016 0750   CL 102 06/21/2022 1616   CO2 18 (L) 06/21/2022 1616   CO2 26 04/22/2016 0750   GLUCOSE 102 (H) 06/21/2022 1616   GLUCOSE 81 03/28/2022 1508   GLUCOSE 142 (H) 04/22/2016 0750   BUN  20 06/21/2022 1616   BUN 19.1 11/20/2016 1355   CREATININE 1.22 (H) 06/21/2022 1616   CREATININE 1.26 (H) 12/29/2019 1015   CREATININE 1.1 11/20/2016 1355   CALCIUM 12.2 (H) 06/21/2022 1616   CALCIUM 9.8 04/22/2016 0750   GFRNONAA 47 (L) 03/28/2022 1508   GFRNONAA 48 (L) 12/29/2019 1015   GFRNONAA 63 07/08/2014 1212   GFRAA 69 04/17/2020 0928   GFRAA 56 (L) 12/29/2019 1015   GFRAA 73 07/08/2014 1212   Lab Results  Component Value Date   HGBA1C 6.3 01/20/2023   HGBA1C 10.1 (H) 07/07/2015   No results found for: "INSULIN" Lab Results  Component Value Date   TSH 1.370 06/21/2022   CBC    Component Value Date/Time   WBC 5.4 03/28/2022 1508   RBC 4.97 03/28/2022 1508   HGB 15.0 03/28/2022 1508   HGB 13.3 02/03/2020 0837   HGB 13.6 12/04/2018 0914   HGB 12.9 04/22/2016 0750   HCT 46.8 (H) 03/28/2022 1508   HCT 41.0 12/04/2018 0914   HCT 39.0 04/22/2016 0750   PLT 318 03/28/2022 1508   PLT 270 02/03/2020 0837   PLT 332 12/04/2018 0914   MCV 94.2 03/28/2022 1508   MCV 88 12/04/2018 0914   MCV 92.2 04/22/2016 0750   MCH 30.2 03/28/2022 1508   MCHC 32.1 03/28/2022 1508   RDW 13.6 03/28/2022 1508   RDW 13.6 12/04/2018 0914   RDW 13.9 04/22/2016 0750   Iron Studies    Component Value Date/Time   IRON 43 08/18/2010 0515   TIBC 389 08/18/2010 0515   FERRITIN 227 08/18/2010 0515   IRONPCTSAT 11 (L) 08/18/2010 0515   Lipid Panel     Component Value Date/Time   CHOL 161 01/20/2023 1032   TRIG 183 (H) 01/20/2023 1032   HDL 46 01/20/2023 1032   CHOLHDL 3.5 01/20/2023 1032   CHOLHDL 5.1 (H) 08/14/2016 1729   VLDL 74 (H) 08/14/2016 1729   LDLCALC 84 01/20/2023 1032   Hepatic Function Panel     Component Value Date/Time   PROT 7.7 10/21/2022 1216   PROT 8.2 04/22/2016 0750   ALBUMIN 4.6 01/09/2022 0855   ALBUMIN 3.8 04/22/2016 0750   AST 26 01/09/2022 0855   AST 29 12/29/2019 1015   AST 27 04/22/2016 0750   ALT 29 01/09/2022 0855  ALT 33 12/29/2019 1015   ALT  24 04/22/2016 0750   ALKPHOS 74 01/09/2022 0855   ALKPHOS 74 04/22/2016 0750   BILITOT 0.5 01/09/2022 0855   BILITOT 0.6 12/29/2019 1015   BILITOT 0.44 04/22/2016 0750   BILIDIR 0.2 08/18/2010 0515   IBILI 1.5 (H) 08/18/2010 0515      Component Value Date/Time   TSH 1.370 06/21/2022 1616   Nutritional Lab Results  Component Value Date   VD25OH 29.26 (L) 10/21/2022   VD25OH 31.2 06/21/2022   VD25OH 35.7 10/22/2021    Attestations:   I, Isabelle Course, acting as a medical scribe for Thomasene Lot, DO., have compiled all relevant documentation for today's office visit on behalf of Thomasene Lot, DO, while in the presence of [employer], DO.  I have reviewed the above documentation for accuracy and completeness, and I agree with the above. Sarah [name], D.O.  The 21st Century Cures Act was signed into law in 2016 which includes the topic of electronic health records.  This provides immediate access to information in MyChart.  This includes consultation notes, operative notes, office notes, lab results and pathology reports.  If you have any questions about what you read please let us know at your next visit so we can discuss your concerns and take corrective action if need be.  We are right here with you.

## 2023-03-27 ENCOUNTER — Other Ambulatory Visit: Payer: Self-pay | Admitting: Internal Medicine

## 2023-03-27 DIAGNOSIS — Z0000 Encounter for general adult medical examination without abnormal findings: Secondary | ICD-10-CM

## 2023-03-29 ENCOUNTER — Other Ambulatory Visit (HOSPITAL_COMMUNITY): Payer: Self-pay

## 2023-03-31 ENCOUNTER — Other Ambulatory Visit (HOSPITAL_COMMUNITY): Payer: Self-pay

## 2023-04-02 ENCOUNTER — Other Ambulatory Visit: Payer: Self-pay | Admitting: Internal Medicine

## 2023-04-03 NOTE — Telephone Encounter (Signed)
Requested Prescriptions  Pending Prescriptions Disp Refills   Insulin Pen Needle (DROPLET PEN NEEDLES) 29G X MISC [Pharmacy Med Name: Droplet Pen Needles Miscellaneous 29G X 12MM] 100 each 0    Sig: USE AS DIRECTED     Endocrinology: Diabetes - Testing Supplies Passed - 04/02/2023  5:53 AM      Passed - Valid encounter within last 12 months    Recent Outpatient Visits           2 months ago Type 2 diabetes mellitus with obesity (HCC)   [facility] Jonah Blue B, MD   6 months ago Type 2 diabetes mellitus with obesity [facility])   [facility] Jonah Blue B, MD   8 months ago Chronic bilateral thoracic back pain   [facility] Jonah Blue B, MD   10 months ago Type 2 diabetes mellitus with obesity [facility])   [facility] Jonah Blue B, MD   1 year ago Type 2 diabetes mellitus with obesity [facility])   [facility] Marcine Matar, MD       Future Appointments             In 1 month Laural Benes, Binnie Rail, MD [facility]

## 2023-04-09 ENCOUNTER — Ambulatory Visit (INDEPENDENT_AMBULATORY_CARE_PROVIDER_SITE_OTHER): Payer: Medicare PPO | Admitting: Family Medicine

## 2023-04-09 ENCOUNTER — Encounter (INDEPENDENT_AMBULATORY_CARE_PROVIDER_SITE_OTHER): Payer: Self-pay | Admitting: Family Medicine

## 2023-04-09 VITALS — BP 127/84 | HR 67 | Temp 97.8°F | Ht 65.0 in | Wt 202.0 lb

## 2023-04-09 DIAGNOSIS — E538 Deficiency of other specified B group vitamins: Secondary | ICD-10-CM

## 2023-04-09 DIAGNOSIS — E669 Obesity, unspecified: Secondary | ICD-10-CM | POA: Diagnosis not present

## 2023-04-09 DIAGNOSIS — Z7985 Long-term (current) use of injectable non-insulin antidiabetic drugs: Secondary | ICD-10-CM

## 2023-04-09 DIAGNOSIS — E1169 Type 2 diabetes mellitus with other specified complication: Secondary | ICD-10-CM

## 2023-04-09 DIAGNOSIS — F39 Unspecified mood [affective] disorder: Secondary | ICD-10-CM

## 2023-04-09 DIAGNOSIS — Z6833 Body mass index (BMI) 33.0-33.9, adult: Secondary | ICD-10-CM

## 2023-04-09 NOTE — Progress Notes (Signed)
Sarah [name], D.O.  ABFM, ABOM Specializing in Clinical Bariatric Medicine  Office located at: [address] [place], [place]  [address]     Assessment and Plan:   Depressed mood (HCC) with emotional eating Assessment & Plan: Pt taking Wellbutrin SR. Tolerating well with no side effects reported. Condition overall stable. Denies any SI/HI. Endorses cravings for chips. Continue on current regimen to manage her condition. We will continue to monitor closely alongside PCP / other specialists.   Type 2 diabetes mellitus with obesity [facility]) Assessment & Plan: Lab Results  Component Value Date   HGBA1C 6.3 01/20/2023   HGBA1C 6.0 09/17/2022   HGBA1C 5.5 05/17/2022   Reviewed last A1c of 6.3, elevated. She is taking Mounjaro 12.5 mg. Tolerating well when eating on-plan foods, which has helped with previous nausea from eating off-plan. She takes zofran to manage her nausea. Pt monitors her blood sugars via continuous glucose monitor, reporting a reading of 91 this morning. Overall her readings range from 90s-110s. Fasting sugars have been 80s-90s on average. Reviewed sugars from monitor app - 14 average is 100, 30 day average is 100, and 90 day average is 101.   Reviewed ideal ranges of A1C, pt expressed understanding. Reviewed normal blood glucose readings of 60-100s with patient. I recommend she not depend on zofran to manage her nausea and instead eat on plan foods. Eat on a regular basis- no skipping or going long periods without eating. Continue on current medication regimen. Will recheck A1C in mid-December.    B12 deficiency due to diet Assessment & Plan: Lab Results  Component Value Date   VITAMINB12 980 01/09/2022   Last B12 is at goal. She was taking 500 mcg of B12 but ran out about 1 month ago and has not taken it since. She endorses fatigue and tiredness. Reviewed the effects of low B12 on energy and weight loss. Continue to monitor her condition as it pertains to  weight loss.    Obesity with starting BMI 37.9/date 02/15/2020 BMI 33.0-33.9,adult- current bmi 33.61 Assessment & Plan: Sarah [name] is here to discuss her progress with her obesity treatment plan along with follow-up of her obesity related diagnoses. See Medical Weight Management Flowsheet for complete bioelectrical impedance results. Extensive counseling given on importance of increasing protein intake to promote weight loss. We discussed the benefits of journaling to help foster mindful eating habits.   Since last office visit on 03/26/23 patient's  Muscle mass has decreased by 0.2 lbs. Fat mass has increased by 1 lb. Total body water has decreased by 0.2 lbs.  Counseling done on how various foods will affect these numbers and how to maximize success  Total lbs lost to date: 26 lbs Total weight loss percentage to date: -11.4 %  Category 1 meal plan and start journaling 1000-1100 calories daily and increase protein intake to 85+ g daily.   Behavioral Intervention Additional resources provided today: Food journaling plan information Evidence-based interventions for health behavior change were utilized today including the discussion of self monitoring techniques, problem-solving barriers and SMART goal setting techniques.   Regarding patient's less desirable eating habits and patterns, we employed the technique of small changes.  Pt will specifically work on: Follow her meal plan for next visit.    FOLLOW UP: Return in about 15 days (around 04/24/2023). She was informed of the importance of frequent follow up visits to maximize her success with intensive lifestyle modifications for her multiple health conditions.  Subjective:  Chief complaint: Obesity [name] is here to discuss her progress with her obesity treatment plan. She is on the Category 1 Plan and keeping a food journal and adhering to recommended goals of 1000-1100 calories and 75+ g of protein and states she is following her  eating plan approximately 70% of the time. She states she is doing Silver Sneakers 50 minutes 2 days per week and walking 30 minutes 4 days per week.  Interval History:  Sarah [name] is here for a follow up office visit. Since last OV, she has been struggling to meet her daily protein intake goal. Tries to follow foods on plan but has been skipping meals and not meeting her protein intake goal. She reports only eating about 2 ounces of protein for dinner. Has been skipping lunch. Tends to eat chicken and fish. Has cut back on spaghetti, and when she does eat it she uses ground [place] and has a smaller portion. Endorses cravings for chips.   Pharmacotherapy for weight loss: She is currently taking Wellbutrin and Mounjaro  for medical weight loss.  Denies side effects.    Review of Systems:  Pertinent positives were addressed with patient today.  Reviewed by clinician on day of visit: allergies, medications, problem list, medical history, surgical history, family history, social history, and previous encounter notes.  Weight Summary and Biometrics   Weight Lost Since Last Visit: 0lb  Weight Gained Since Last Visit: 1lb    Vitals Temp: 97.8 F (36.6 C) BP: 127/84 Pulse Rate: 67 SpO2: 100 %   Anthropometric Measurements Height: 5\' 5"  (1.651 m) Weight: 202 lb (91.6 kg) BMI (Calculated): 33.61 Weight at Last Visit: 201lb Weight Lost Since Last Visit: 0lb Weight Gained Since Last Visit: 1lb Starting Weight: 228lb Total Weight Loss (lbs): 26 lb (11.8 kg) Peak Weight: 228lb   Body Composition  Body Fat %: 39.9 % Fat Mass (lbs): 80.8 lbs Muscle Mass (lbs): 115.4 lbs Total Body Water (lbs): 73.6 lbs Visceral Fat Rating : 11   Other Clinical Data Fasting: no Labs: no Today's Visit #: 71 Starting Date: 02/15/20    Objective:   PHYSICAL EXAM: Blood pressure 127/84, pulse 67, temperature 97.8 F (36.6 C), height 5\' 5"  (1.651 m), weight 202 lb (91.6 kg), SpO2 100%. Body  mass index is 33.61 kg/m.  General: Well Developed, well nourished, and in no acute distress.  HEENT: Normocephalic, atraumatic Skin: Warm and dry, cap RF less 2 sec, good turgor Chest:  Normal excursion, shape, no gross abn Respiratory: speaking in full sentences, no conversational dyspnea NeuroM-Sk: Ambulates w/o assistance, moves * 4 Psych: A and O *3, insight good, mood-full  DIAGNOSTIC DATA REVIEWED:  BMET    Component Value Date/Time   NA 141 06/21/2022 1616   NA 138 04/22/2016 0750   K 4.2 06/21/2022 1616   K 3.9 04/22/2016 0750   CL 102 06/21/2022 1616   CO2 18 (L) 06/21/2022 1616   CO2 26 04/22/2016 0750   GLUCOSE 102 (H) 06/21/2022 1616   GLUCOSE 81 03/28/2022 1508   GLUCOSE 142 (H) 04/22/2016 0750   BUN 20 06/21/2022 1616   BUN 19.1 11/20/2016 1355   CREATININE 1.22 (H) 06/21/2022 1616   CREATININE 1.26 (H) 12/29/2019 1015   CREATININE 1.1 11/20/2016 1355   CALCIUM 12.2 (H) 06/21/2022 1616   CALCIUM 9.8 04/22/2016 0750   GFRNONAA 47 (L) 03/28/2022 1508   GFRNONAA 48 (L) 12/29/2019 1015   GFRNONAA 63 07/08/2014 1212   GFRAA 69 04/17/2020 1610  GFRAA 56 (L) 12/29/2019 1015   GFRAA 73 07/08/2014 1212   Lab Results  Component Value Date   HGBA1C 6.3 01/20/2023   HGBA1C 10.1 (H) 07/07/2015   No results found for: "INSULIN" Lab Results  Component Value Date   TSH 1.370 06/21/2022   CBC    Component Value Date/Time   WBC 5.4 03/28/2022 1508   RBC 4.97 03/28/2022 1508   HGB 15.0 03/28/2022 1508   HGB 13.3 02/03/2020 0837   HGB 13.6 12/04/2018 0914   HGB 12.9 04/22/2016 0750   HCT 46.8 (H) 03/28/2022 1508   HCT 41.0 12/04/2018 0914   HCT 39.0 04/22/2016 0750   PLT 318 03/28/2022 1508   PLT 270 02/03/2020 0837   PLT 332 12/04/2018 0914   MCV 94.2 03/28/2022 1508   MCV 88 12/04/2018 0914   MCV 92.2 04/22/2016 0750   MCH 30.2 03/28/2022 1508   MCHC 32.1 03/28/2022 1508   RDW 13.6 03/28/2022 1508   RDW 13.6 12/04/2018 0914   RDW 13.9 04/22/2016  0750   Iron Studies    Component Value Date/Time   IRON 43 08/18/2010 0515   TIBC 389 08/18/2010 0515   FERRITIN 227 08/18/2010 0515   IRONPCTSAT 11 (L) 08/18/2010 0515   Lipid Panel     Component Value Date/Time   CHOL 161 01/20/2023 1032   TRIG 183 (H) 01/20/2023 1032   HDL 46 01/20/2023 1032   CHOLHDL 3.5 01/20/2023 1032   CHOLHDL 5.1 (H) 08/14/2016 1729   VLDL 74 (H) 08/14/2016 1729   LDLCALC 84 01/20/2023 1032   Hepatic Function Panel     Component Value Date/Time   PROT 7.7 10/21/2022 1216   PROT 8.2 04/22/2016 0750   ALBUMIN 4.6 01/09/2022 0855   ALBUMIN 3.8 04/22/2016 0750   AST 26 01/09/2022 0855   AST 29 12/29/2019 1015   AST 27 04/22/2016 0750   ALT 29 01/09/2022 0855   ALT 33 12/29/2019 1015   ALT 24 04/22/2016 0750   ALKPHOS 74 01/09/2022 0855   ALKPHOS 74 04/22/2016 0750   BILITOT 0.5 01/09/2022 0855   BILITOT 0.6 12/29/2019 1015   BILITOT 0.44 04/22/2016 0750   BILIDIR 0.2 08/18/2010 0515   IBILI 1.5 (H) 08/18/2010 0515      Component Value Date/Time   TSH 1.370 06/21/2022 1616   Nutritional Lab Results  Component Value Date   VD25OH 29.26 (L) 10/21/2022   VD25OH 31.2 06/21/2022   VD25OH 35.7 10/22/2021    Attestations:   Reviewed by clinician on day of visit: allergies, medications, problem list, medical history, surgical history, family history, social history, and previous encounter notes pertinent to patient's obesity diagnosis.    This encounter took 40 total minutes of time including time coordinating the above treatment plan, any pre-visit chart review and post-visit documentation and reviewing any labs and/or imaging, reviewing pertinent prior notes, and providing counseling the patient about such treatment plan.  Approximately 50% of this time was spent in direct, face-to-face counseling and coordination of care.  I, Isabelle Course, acting as a medical scribe for Thomasene Lot, DO., have compiled all relevant documentation for  today's office visit on behalf of Thomasene Lot, DO, while in the presence of [employer], DO.  I have reviewed the above documentation for accuracy and completeness, and I agree with the above. Sarah [name], D.O.  The 21st Century Cures Act was signed into law in 2016 which includes the topic of electronic health records.  This provides immediate  access to information in MyChart.  This includes consultation notes, operative notes, office notes, lab results and pathology reports.  If you have any questions about what you read please let us know at your next visit so we can discuss your concerns and take corrective action if need be.  We are right here with you.

## 2023-04-10 ENCOUNTER — Other Ambulatory Visit: Payer: Self-pay | Admitting: Internal Medicine

## 2023-04-10 DIAGNOSIS — G8929 Other chronic pain: Secondary | ICD-10-CM

## 2023-04-21 ENCOUNTER — Other Ambulatory Visit: Payer: Self-pay | Admitting: Internal Medicine

## 2023-04-21 ENCOUNTER — Encounter: Payer: Self-pay | Admitting: Internal Medicine

## 2023-04-21 DIAGNOSIS — E1169 Type 2 diabetes mellitus with other specified complication: Secondary | ICD-10-CM

## 2023-04-21 MED ORDER — FREESTYLE LIBRE READER DEVI: 1.0000 | 0 refills | Status: DC

## 2023-04-23 ENCOUNTER — Ambulatory Visit: Payer: Medicare PPO | Admitting: Internal Medicine

## 2023-04-23 ENCOUNTER — Encounter: Payer: Self-pay | Admitting: Internal Medicine

## 2023-04-23 ENCOUNTER — Other Ambulatory Visit: Payer: Self-pay | Admitting: Internal Medicine

## 2023-04-23 DIAGNOSIS — E1169 Type 2 diabetes mellitus with other specified complication: Secondary | ICD-10-CM

## 2023-04-23 DIAGNOSIS — E1165 Type 2 diabetes mellitus with hyperglycemia: Secondary | ICD-10-CM | POA: Diagnosis not present

## 2023-04-23 DIAGNOSIS — E042 Nontoxic multinodular goiter: Secondary | ICD-10-CM

## 2023-04-23 DIAGNOSIS — Z794 Long term (current) use of insulin: Secondary | ICD-10-CM

## 2023-04-23 LAB — PHOSPHORUS: Phosphorus: 2.9 mg/dL (ref 2.3–4.6)

## 2023-04-23 LAB — HEMOGLOBIN A1C: Hgb A1c MFr Bld: 6.4 % (ref 4.6–6.5)

## 2023-04-23 LAB — VITAMIN D 25 HYDROXY (VIT D DEFICIENCY, FRACTURES): VITD: 33.96 ng/mL (ref 30.00–100.00)

## 2023-04-23 MED ORDER — FREESTYLE LIBRE READER DEVI: 0 refills | Status: DC

## 2023-04-23 NOTE — Patient Instructions (Addendum)
Please stop at the lab.  Continue: - Metformin 500 mg once a day - Mounjaro 12.5 mg weekly  Also continue: - vitamin D 1000 units daily  Please return for another visit in 6 months.

## 2023-04-23 NOTE — Progress Notes (Signed)
Patient ID: Sarah [name], female   DOB: Jan 01, 1966, 57 y.o.   MRN: [id number]  HPI  Sarah [name] is a 57 y.o.-year-old female, returning for follow-up for hypercalcemia/hyperparathyroidism, also thyroid nodules and controlled DM2.  She previously saw Dr. Everardo All, but last visit with me was 6 months ago.  Interim history: She continues to have constipation and also occasional nausea, especially with larger meals. She continues to be followed in Weight management clinic. On Mounjaro.  She continues to exercise consistently.  Hypercalcemia:  Pt was dx with hypercalcemia in 2020. I reviewed pt's pertinent labs: Lab Results  Component Value Date   PTH 29 10/21/2022   PTH 16 06/21/2022   PTH 26 07/26/2020   PTH 26 04/29/2019   PTH Comment 04/29/2019   PTH 21 03/22/2019   PTH Comment 03/22/2019   Lab Results  Component Value Date   CAION 6.0 (H) 10/21/2022   CAION 5.9 (H) 06/21/2022   Lab Results  Component Value Date   CALCIUM 12.2 (H) 06/21/2022   CALCIUM 11.3 (H) 03/28/2022   CALCIUM 12.1 (H) 01/09/2022   CALCIUM 10.5 (H) 07/04/2021   CALCIUM 11.3 (H) 06/26/2021   CALCIUM 12.1 (H) 05/28/2021   CALCIUM 12.3 (H) 03/07/2021   CALCIUM 11.5 (H) 11/29/2020   CALCIUM 11.7 (H) 07/26/2020   CALCIUM 11.9 (H) 04/17/2020   CALCIUM 10.7 (H) 01/11/2020   CALCIUM 11.7 (H) 12/29/2019   CALCIUM 10.9 (H) 10/06/2019   CALCIUM 10.6 (H) 09/29/2019   CALCIUM 12.0 (H) 09/23/2019   CALCIUM 10.5 (H) 06/16/2019   CALCIUM 11.1 (H) 04/29/2019   CALCIUM 10.9 (H) 03/22/2019   CALCIUM 10.6 (H) 12/04/2018   CALCIUM 10.4 (H) 11/23/2018   CALCIUM 10.0 08/28/2018   CALCIUM 9.7 11/17/2017   CALCIUM 9.8 04/22/2016   CALCIUM 10.1 01/22/2016   CALCIUM 10.3 10/02/2015   CALCIUM 8.9 07/10/2015   CALCIUM 9.2 07/09/2015   CALCIUM 9.3 07/08/2015   CALCIUM 9.4 07/07/2015   CALCIUM 9.5 07/03/2015   CALCIUM 9.9 04/27/2015   CALCIUM 10.1 03/27/2015   CALCIUM 8.9 12/22/2014   CALCIUM 9.5 12/08/2014    CALCIUM 9.2 11/24/2014   CALCIUM 9.0 11/17/2014   CALCIUM 8.7 11/03/2014   CALCIUM 10.5 (H) 10/27/2014   CALCIUM 8.4 10/18/2014   CALCIUM 9.3 10/03/2014   CALCIUM 9.4 09/22/2014   CALCIUM 10.3 09/15/2014   CALCIUM 9.2 09/08/2014   CALCIUM 9.5 09/01/2014   CALCIUM 9.7 08/19/2014   CALCIUM 9.1 07/31/2014   CALCIUM 9.4 07/30/2014   CALCIUM 8.5 07/29/2014   CALCIUM 10.4 07/08/2014   CALCIUM 8.3 (L) 06/29/2014   A 24-hour urine calcium was normal (no creatinine associated with the reading): Component     Latest Ref Rng 08/16/2020  Calcium, 24H Urine     mg/24 h 81    A repeat 24-hour urine calcium was low: Component     Latest Ref Rng 06/24/2022  Calcium, 24H Urine     mg/24 h 30 (L)   Creatinine, 24H Ur     0.50 - 2.15 g/24 h 1.02   The fractional excretion of calcium is 0.0029% (<0.01%).  She does not take calcium supplements.  No fractures or falls or evidence of osteoporosis.   No h/o kidney stones.  She has CKD stage III. Last BUN/Cr: Lab Results  Component Value Date   BUN 20 06/21/2022   BUN 19 03/28/2022   CREATININE 1.22 (H) 06/21/2022   CREATININE 1.32 (H) 03/28/2022   Pt was previously on HCTZ, which was  stopped by Dr. Everardo All.  She is not on vitamin A.  She has a h/o vitamin D deficiency. Reviewed vit D levels: Lab Results  Component Value Date   VD25OH 29.26 (L) 10/21/2022   VD25OH 31.2 06/21/2022   VD25OH 35.7 10/22/2021   VD25OH 99.6 06/26/2021   VD25OH 81.1 03/07/2021   VD25OH 60.6 11/29/2020   VD25OH 41.54 07/26/2020   VD25OH 44.6 04/17/2020   VD25OH 38.2 02/15/2020   VD25OH 10.2 (L) 05/14/2019  She was not taking vitamin D  at last OV - off x 1 year (was on Ergocalciferol). She started 1000 units daily - 10/2022.  Calcitriol, vit A and PTH-rp levels were normal: Component     Latest Ref Rng 03/22/2019 04/29/2019 07/26/2020  Vitamin D 1, 25 (OH) Total     18 - 72 pg/mL   24   Vitamin D3 1, 25 (OH)     pg/mL   <8   Vitamin D2 1, 25 (OH)      pg/mL   24   PTH-related peptide     pmol/L <2.0  <2.0    Vitamin A (Retinoic Acid)     38 - 98 mcg/dL   73   PTH-Related Protein (PTH-RP)     11 - 20 pg/mL   17    Magnesium levels were reviewed: Lab Results  Component Value Date   MG 1.6 06/21/2022   MG 1.5 (L) 01/09/2022   MG 1.6 11/29/2020   MG 1.7 07/12/2015   MG 1.9 07/11/2015   MG 2.0 07/10/2015   MG 2.2 07/09/2015   MG 2.3 07/08/2015   MG 1.9 08/19/2010   Phosphorus level was slightly low: Lab Results  Component Value Date   PHOS 2.6 (L) 06/21/2022   Of note, patient has a history of increased free light chains - she had bone marrow biopsy for this and was seen by hematology but no further workup is needed for this and no follow-up was recommended.  At last visit we checked an SPEP >> normal: Component     Latest Ref Rng 10/21/2022  Total Protein     6.0 - 8.5 g/dL 7.7   Globulin, Total     2.2 - 3.9 g/dL 3.5   Alpha 1     0.0 - 0.4 g/dL 0.2   IgG (Immunoglobin G), Serum     586 - 1,602 mg/dL 4,098   IgM (Immunoglobulin M), Srm     26 - 217 mg/dL 45   Albumin SerPl Elph-Mcnc     2.9 - 4.4 g/dL 4.2   Alpha2 Glob SerPl Elph-Mcnc     0.4 - 1.0 g/dL 0.7   B-Globulin SerPl Elph-Mcnc     0.7 - 1.3 g/dL 1.2   Gamma Glob SerPl Elph-Mcnc     0.4 - 1.8 g/dL 1.3   M Protein SerPl Elph-Mcnc     Not Observed g/dL Not Observed   Albumin/Glob SerPl     0.7 - 1.7  1.3   IFE 1 Comment   Please Note (HCV): Comment   IgA/Immunoglobulin A, Serum     87 - 352 mg/dL 119    Also, on the chest CT scan from 07/11/2020, 1.1 cm right lower lobe nodule appears to be mildly progressive, with slow growth, but without increased uptake on the PET scan and dotatate scan.   The radiologist suggested that this may be an indolent neoplasm such as pulmonary carcinoid.  Nuclear medicine bone scan (07/09/2022): Uptake at the shoulders, sternoclavicular  joints, wrists, knees, ankles/feet, typically degenerative. Uptake at lateral  aspects of lower lumbar spine at L4-L5 likely reflecting degenerative changes. No additional worrisome sites of abnormal osseous tracer accumulation are identified. Expected urinary tract and soft tissue distribution of tracer.   IMPRESSION: Scattered degenerative type uptake of tracer at multiple joints and at lower lumbar spine. No scintigraphic evidence of osseous metastatic disease.  CT chest (01/22/2023): Cardiovascular: Atherosclerotic calcification of the aorta with age advanced involvement of the coronary arteries. Enlarged pulmonic trunk and heart. No pericardial effusion.   Mediastinum/Nodes: Left thyroidectomy. No pathologically enlarged mediastinal or axillary lymph nodes. Hilar regions are difficult to definitively evaluate without IV contrast. Esophagus is grossly unremarkable.   Lungs/Pleura: Minimal scattered pulmonary parenchymal scarring. 11 mm right infrahilar intrapulmonary lymph node (5/72), unchanged from 07/11/2020 but enlarged from 01/12/2015, at which time it measured 4 mm. No suspicious pulmonary nodules. No pleural fluid. Airway is unremarkable.   Upper Abdomen: Visualized portions of the liver gallbladder, adrenal glands, spleen, pancreas and stomach are grossly unremarkable. No upper abdominal adenopathy.   Musculoskeletal: Degenerative changes in the spine. Intramuscular lipoma along the posterior right scapula. No worrisome lytic or sclerotic lesions.   IMPRESSION: 1. Perihilar right lower lobe nodule is stable from recent prior examinations but has increased in size from 01/12/2015. Indolent carcinoid remains a diagnostic consideration. 2. Age advanced three-vessel coronary artery calcification. 3.  Aortic atherosclerosis (ICD10-I70.0). 4. Enlarged pulmonic trunk, indicative of pulmonary arterial hypertension.  Pt does not have a FH of hypercalcemia, pituitary tumors, thyroid cancer, or osteoporosis.  Possible FH of kidney stones in  father.  Thyroid nodules: Patient was found to have several thyroid nodules:  Thyroid U/S (08/17/2010): Right thyroid lobe:  Measures 5.1 x 1.5 x 1.8 cm.  Heterogeneous echogenicity.  Left thyroid lobe:  Measures 8.6 x 3.6 x 4.5 cm.  Heterogeneous echogenicity.  Isthmus: Measures 1.3 cm.   Focal nodules:  There are multiple bilateral thyroid nodules:   The largest lesion in the right lobe measures 1.4 x 0.8 x 1.0 cm and is in the upper pole region.   There is a large lesion in the isthmus which measures 4.0 x 1.5 x 2.5 cm.   The left lobe is occupied by to large complex thyroid nodules which  are largely solid but have cystic components.  The largest lesion  measures 4.8 x 3.7 x 4.1 cm and is in the lower aspect of the lobe.   The adjacent lesion measures 4.1 x 2.5 x 4.0 cm.  Fine-needle  aspiration/biopsy of the two left lobe lesions are suggested to  exclude malignancy.   Lymphadenopathy:  None visualized.   IMPRESSION:   Multinodular thyroid goiter.  The largest lesions in the left  lower lobe are complex and fine-needle aspiration/biopsy is  suggested.  The other lesions can be followed depending on the  pathology.   FNA of the 2 L thyroid nodules (08/29/2010): Both benign  She had left thyroidectomy + isthmusectomy (10/23/2010):  Thyroid, lobectomy, left with isthmus - ADENOMATOUS GOITER. - THERE IS NO EVIDENCE OF MALIGNANCY.  Grossly, there are two dominant lesions present. The larger measures 6.2 cm and is present in the left lobe. There is a smaller nodule present in the isthmus, spanning 2.2 cm. Histologic evaluation of both lesions reveals mixed macro and microfollicular adenomas. Malignant features are not identified. (JK:mw 10-24-10)  Thyroid U/S (10/22/2019): 3 nodules, spongiform and solid/cystic nodular areas that did not need follow-up: Parenchymal Echotexture: Moderately heterogenous  Isthmus: Surgically  absent.  Right lobe: 6.2 x 2.1 x 2.8 cm  Left lobe:  Surgically absent.  _________________________________________________________   Estimated total number of nodules >/= 1 cm: 3  _________________________________________________________   Nodule # 1:  Prior biopsy: No  Location: Right; Superior  Maximum size: 1.7 cm; Other 2 dimensions: 1.4 x 1.4 cm, previously, 1.4 cm  Composition: mixed cystic and solid (1)  Echogenicity: isoechoic (1) Change in features: Largely cystic and demonstrating interval cystic degeneration since 2012. Change in ACR TI-RADS risk category: Yes This nodule does NOT meet TI-RADS criteria for biopsy or dedicated follow-up.  _________________________________________________________   Nodule # 2:  Location: Right; Inferior  Maximum size: 2.1 cm; Other 2 dimensions: 1.2 x 1.3 cm  Composition: spongiform (0) This nodule does NOT meet TI-RADS criteria for biopsy or dedicated follow-up.  _________________________________________________________   Three other small vague areas were measured in the tip of the right lobe inferiorly which all appear to be adjacent/part of the same spongiform area referred to as nodule #2 above. Irregardless, these spongiform areas even if they were considered separate nodules do not meet criteria for biopsy or further follow-up.   No abnormal tissue is identified in the left neck at the level of prior thyroidectomy. No abnormal lymph nodes identified.   IMPRESSION: Residual right lobe of the thyroid demonstrates spongiform and solid/cystic nodular areas that do not meet criteria for biopsy or further follow-up.   Previous thyroid tests reviewed: Lab Results  Component Value Date   TSH 1.370 06/21/2022   TSH 0.938 02/15/2020   TSH 0.977 04/29/2019   TSH 0.698 03/22/2019   TSH 0.75 08/14/2016   TSH 1.860 07/31/2014   TSH 1.867 12/10/2010   TSH 0.996 08/16/2010   Lab Results  Component Value Date   FREET4 1.29 02/15/2020   FREET4 1.01 12/10/2010   FREET4 1.07  08/16/2010   + FH of thyroid ds. In M and sister.  DM2: - dx'ed 2015-2016 at the time of her ovarian surgery - insulin soon after DM  Reviewed HbA1c levels: Lab Results  Component Value Date   HGBA1C 6.3 01/20/2023   HGBA1C 6.0 09/17/2022   HGBA1C 5.5 05/17/2022   HGBA1C 5.5 01/09/2022   HGBA1C 5.7 10/04/2021   HGBA1C 6.2 (H) 06/26/2021   HGBA1C 7.0 (H) 03/07/2021   HGBA1C 7.2 (H) 11/29/2020   HGBA1C 5.6 06/14/2020   HGBA1C 6.9 (H) 04/17/2020   HGBA1C 9.0 (H) 01/11/2020   HGBA1C 9.8 (A) 09/23/2019   HGBA1C 9.3 (A) 03/22/2019   HGBA1C 10.8 (H) 12/04/2018   HGBA1C 9.6 (H) 08/28/2018   HGBA1C 7.6 (A) 05/22/2018   HGBA1C 7.6 (A) 02/20/2018   HGBA1C 7.8 09/03/2017   HGBA1C 8.6 06/18/2017   HGBA1C 7.7 02/17/2017   She is on the following regimen: - Metformin 500 mg once a day, recently reduced from twice a day - Lantus 20 >> 10 units daily >> off  - Mounjaro 12.5 mg weekly  She is seen in the Weight management clinic. She is exercising 3 [facility] a week at the [facility].  Pt checks her sugars 1-2x a day a day and they are: - am: <110 >> 80-120 - 2h after b'fast: n/c - before lunch: n/c - 2h after lunch: n/c - before dinner: n/c - 2h after dinner: n/c - bedtime: <140 >> 110-120 - nighttime: n/c Lowest sugar was 89 >> 70; she has hypoglycemia awareness at 60.  Highest sugar was 148 >> 140.  Glucometer: Accu-Chek  - + CKD, last BUN/creatinine:  Lab Results  Component Value Date   BUN 20 06/21/2022   BUN 19 03/28/2022   CREATININE 1.22 (H) 06/21/2022   CREATININE 1.32 (H) 03/28/2022  No results found for: "GFR" Lab Results  Component Value Date   MICRALBCREAT 31 (H) 05/17/2022   MICRALBCREAT 31 (H) 05/28/2021   MICRALBCREAT 110 (H) 01/24/2020   MICRALBCREAT 150 (H) 03/22/2019   MICRALBCREAT 228.6 (H) 02/20/2018   MICRALBCREAT <30 11/13/2016   MICRALBCREAT 4 07/27/2015  On Cozaar 50 mg daily.  -+ HL; last set of lipids: Lab Results  Component Value Date   CHOL  161 01/20/2023   HDL 46 01/20/2023   LDLCALC 84 01/20/2023   TRIG 183 (H) 01/20/2023   CHOLHDL 3.5 01/20/2023  On Crestor 20 mg daily.  - last eye exam was in 05/2022. No DR reportedly. + Glaucoma, cataracts.  - + numbness and tingling in her feet -due to chemotherapy >> improved.  On Neurontin.  Last foot exam 01/20/2023.  Pt. also has a history of HTN, GERD, B12 deficiency, obesity class II, depression, and also history of ovarian cancer for which she had chemotherapy in the past, with subsequent peripheral neuropathy-on gabapentin.  Also, she was found to have aortic atherosclerosis on the CT scan from 07/2020.  ROS: + see HPI  Past Medical History:  Diagnosis Date   Arthritis    Diabetes mellitus without complication (HCC)    Family history of adverse reaction to anesthesia    sister-n/v   Family history of colon cancer    GERD (gastroesophageal reflux disease)    Glaucoma 07/2014   Glucagonoma 07/28/2014   Pt denies this but states she has glaucoma   History of chemotherapy    History of hiatal hernia    Hypercalcemia    Hypertension    Imbalance    Neuropathy    feet bilat   Nocturia    Peritoneal carcinomatosis (HCC)    carcinoma of ovary    Shortness of breath dyspnea    hx of 2013 - no problems currently    Sleep apnea    Thyroid nodule    history of    Wears glasses    Past Surgical History:  Procedure Laterality Date   ABDOMINAL HYSTERECTOMY     CATARACT EXTRACTION     RT eye 11/28/22 and LT eye 12/19/2022   FOOT SURGERY  04/2018   [facility]   HERNIA REPAIR     INCISIONAL HERNIA REPAIR N/A 07/06/2015   Procedure: INCISIONAL HERNIA REPAIR ;  Surgeon: Emelia Loron, MD;  Location: [facility];  Service: General;  Laterality: N/A;   INCISIONAL HERNIA REPAIR N/A 07/03/2021   Procedure: Sherald Hess HERNIA REPAIR WITH MESH;  Surgeon: Axel Filler, MD;  Location: [facility] OR;  Service: General;  Laterality: N/A;   INSERTION OF MESH N/A 07/06/2015   Procedure:  WITH INSERTION OF PHASIX ST MESH;  Surgeon: Emelia Loron, MD;  Location: [facility];  Service: General;  Laterality: N/A;   LAPAROTOMY N/A 07/06/2015   Procedure: EXPLORATORY LAPAROTOMY;  Surgeon: Adolphus Birchwood, MD;  Location: [facility];  Service: Gynecology;  Laterality: N/A;   LYSIS OF ADHESION N/A 07/06/2015   Procedure:  LYSIS OF ADHESION RESECTION OF MESENTERIC MASS BOWEL RESECTION ;  Surgeon: Adolphus Birchwood, MD;  Location: [facility];  Service: Gynecology;  Laterality: N/A;   LYSIS OF ADHESION N/A 07/03/2021   Procedure: EXPLORATORY LAPAROTOMY WITH LYSIS OF ADHESION;  Surgeon: Axel Filler, MD;  Location: [facility];  Service: General;  Laterality: N/A;   ROBOTIC ASSISTED TOTAL HYSTERECTOMY WITH BILATERAL SALPINGO OOPHERECTOMY Bilateral 07/28/2014   Procedure: ROBOTIC ASSISTED lysis of adhesions with biopsies, converted to LAPAROTOMY, bilateral salpingoorphorectomy, omentectomy,appendectomy;  Surgeon: Laurette Schimke, MD;  Location: [facility];  Service: Gynecology;  Laterality: Bilateral;   THYROIDECTOMY, PARTIAL     VIDEO BRONCHOSCOPY N/A 01/13/2020   Procedure: VIDEO BRONCHOSCOPY WITH BIOPSIES;  Surgeon: Loreli Slot, MD;  Location: [facility] OR;  Service: Thoracic;  Laterality: N/A;   Social History   Socioeconomic History   Marital status: Married    Spouse name: Not on file   Number of children: 2   Years of education: Not on file   Highest education level: 12th grade  Occupational History   Occupation: disability  Tobacco Use   Smoking status: Never   Smokeless tobacco: Never  Vaping Use   Vaping status: Never Used  Substance and Sexual Activity   Alcohol use: No   Drug use: No   Sexual activity: Not Currently    Birth control/protection: None  Other Topics Concern   Not on file  Social History Narrative   No issues with transportation.    Attends church   Social Determinants of Health   Financial Resource Strain: Low Risk  (09/16/2022)   Overall Financial Resource Strain (CARDIA)     Difficulty of Paying Living Expenses: Not very hard  Food Insecurity: No Food Insecurity (09/16/2022)   Hunger Vital Sign    Worried About Running Out of Food in the Last Year: Never true    Ran Out of Food in the Last Year: Never true  Transportation Needs: No Transportation Needs (09/16/2022)   PRAPARE - Administrator, Civil Service (Medical): No    Lack of Transportation (Non-Medical): No  Physical Activity: Sufficiently Active (09/16/2022)   Exercise Vital Sign    Days of Exercise per Week: 3 days    Minutes of Exercise per Session: 50 min  Stress: No Stress Concern Present (09/16/2022)   [employer] of Occupational Health - Occupational Stress Questionnaire    Feeling of Stress : Only a little  Social Connections: Moderately Integrated (09/16/2022)   Social Connection and Isolation Panel [NHANES]    Frequency of Communication with Friends and Family: More than three times a week    Frequency of Social Gatherings with Friends and Family: Twice a week    Attends Religious Services: More than 4 times per year    Active Member of [employer] or Organizations: Yes    Attends Engineer, structural: More than 4 times per year    Marital Status: Separated  Recent Concern: Social Connections - Moderately Isolated (06/28/2022)   Social Connection and Isolation Panel [NHANES]    Frequency of Communication with Friends and Family: More than three times a week    Frequency of Social Gatherings with Friends and Family: More than three times a week    Attends Religious Services: More than 4 times per year    Active Member of [employer] or Organizations: No    Attends Banker Meetings: Never    Marital Status: Separated  Intimate Partner Violence: Not At Risk (06/28/2022)   Humiliation, Afraid, Rape, and Kick questionnaire    Fear of Current or Ex-Partner: No    Emotionally Abused: No    Physically Abused: No    Sexually Abused: No   Current Outpatient Medications on  File Prior to Visit  Medication Sig Dispense Refill   albuterol (VENTOLIN HFA)  108 (90 Base) MCG/ACT inhaler Inhale 2 puffs into the lungs every 6 (six) hours as needed for wheezing or shortness of breath. 8 g 0   amLODipine (NORVASC) 10 MG tablet Take 1 tablet (10 mg total) by mouth daily. 90 tablet 3   Blood Glucose Monitoring Suppl (ACCU-CHEK GUIDE) w/Device KIT Use to check blood sugar 3 times daily. 1 kit 0   buPROPion (WELLBUTRIN SR) 200 MG 12 hr tablet Take 1 tablet (200 mg total) by mouth 2 (two) times daily. 60 tablet 0   Cholecalciferol (VITAMIN D3) 25 MCG (1000 UT) CAPS Take 1 capsule (1,000 Units total) by mouth daily. Per Endo in 10/2022 60 capsule    Continuous Glucose Receiver (FREESTYLE LIBRE READER) DEVI 1 Device by Does not apply route continuous. Use to check blood glucose continuously Dx E11.69, E66.9 3 each 0   dorzolamide-timolol (COSOPT) 2-0.5 % ophthalmic solution Place 1 drop into both eyes 2 (two) times daily. 10 mL 3   fluticasone (FLONASE) 50 MCG/ACT nasal spray Place 2 sprays into both nostrils daily. 16 g 3   gabapentin (NEURONTIN) 600 MG tablet Take 1 tablet (600 mg total) by mouth 3 (three) times daily. 270 tablet 2   glucose blood (ACCU-CHEK GUIDE) test strip Use to check blood sugar 3 times daily. 100 each 6   hydrALAZINE (APRESOLINE) 25 MG tablet TAKE 1 AND 1/2 TABLETS THREE TIMES DAILY 405 tablet 3   HYDROcodone-acetaminophen (NORCO/VICODIN) 5-325 MG tablet Take 1 tablet by mouth every 4 (four) hours as needed for pain. 12 tablet 0   Insulin Pen Needle (DROPLET PEN NEEDLES) 29G X MISC USE AS DIRECTED 100 each 0   Lancets Misc. (ACCU-CHEK SOFTCLIX LANCET DEV) KIT Check blood sugars 3 times a day. 3 kit 6   latanoprost (XALATAN) 0.005 % ophthalmic solution Place 1 drop into both eyes every evening at bedtime as directed 2.5 mL 6   levocetirizine (XYZAL) 5 MG tablet Take 1 tablet (5 mg total) by mouth every evening. 90 tablet 0   lidocaine (LIDODERM) 5 % APPLY 1  PATCH ONTO THE SKIN DAILY. REMOVE AND DISCARD PATCH WITHIN 12 HOURS OR AS DIRECTED BY MD 90 patch 0   losartan (COZAAR) 50 MG tablet TAKE 1 TABLET EVERY DAY 90 tablet 3   metFORMIN (GLUCOPHAGE) 500 MG tablet Take 1 tablet (500 mg total) by mouth daily with breakfast. 90 tablet 3   methocarbamol (ROBAXIN) 500 MG tablet Take 1 tablet (500 mg total) by mouth 2 (two) times daily as needed for muscle spasms. 40 tablet 3   metoprolol tartrate (LOPRESSOR) 25 MG tablet TAKE 1/2 TABLET TWICE DAILY 90 tablet 3   montelukast (SINGULAIR) 10 MG tablet Take 1 tablet (10 mg total) by mouth at bedtime. 90 tablet 3   omeprazole (PRILOSEC) 20 MG capsule Take 1 capsule (20 mg total) by mouth daily. 90 capsule 0   ondansetron (ZOFRAN) 4 MG tablet Take 1 tablet by mouth every 6 hours as needed severe nausea 30 tablet 0   rosuvastatin (CRESTOR) 20 MG tablet Take 1 tablet (20 mg total) by mouth daily. 90 tablet 3   solifenacin (VESICARE) 5 MG tablet Take 1 tablet (5 mg total) by mouth daily. 90 tablet 3   tirzepatide (MOUNJARO) 12.5 MG/0.5ML Pen Inject 12.5 mg into the skin once a week. 2 mL 0   traMADol (ULTRAM) 50 MG tablet Take 1 tablet (50 mg total) by mouth every 12 (twelve) hours as needed. 60 tablet 0   triamcinolone  cream (KENALOG) 0.1 % Apply 1 Application topically 2 (two) times daily. 30 g 0   vitamin B-12 (CYANOCOBALAMIN) 500 MCG tablet Take 1 tablet (500 mcg total) by mouth daily.     No current facility-administered medications on file prior to visit.   Allergies  Allergen Reactions   Emend [Aprepitant] Other (See Comments)    Urticaria    Lisinopril Cough   Family History  Problem Relation Age of Onset   Hypertension Mother    Hyperlipidemia Mother    Stroke Mother    Thyroid disease Mother    Arthritis Mother    Hearing loss Mother    Hypertension Father    Diabetes Father    Hyperlipidemia Father    Sudden death Father    Arthritis Father    Heart disease Father    Obesity Father     Cancer Sister 86       fibrosarcoma (back); currently 36   Cancer Brother    Prostate cancer Maternal Uncle    Colon cancer Paternal Aunt        Dx 2s; deceased 65s   Prostate cancer Paternal Uncle        currently 49   Cancer Paternal Uncle 47       "bone" ; unk. primary   Stomach cancer Paternal Uncle    Hypercalcemia Neg Hx    PE: BP 120/70   Pulse 68   Ht 5\' 5"  (1.651 m)   Wt 208 lb 9.6 oz (94.6 kg)   LMP  (LMP Unknown)   SpO2 96%   BMI 34.71 kg/m  Wt Readings from Last 10 Encounters:  04/23/23 208 lb 9.6 oz (94.6 kg)  04/09/23 202 lb (91.6 kg)  03/26/23 201 lb (91.2 kg)  03/05/23 206 lb (93.4 kg)  02/19/23 205 lb (93 kg)  02/05/23 209 lb 9.6 oz (95.1 kg)  01/30/23 207 lb (93.9 kg)  01/20/23 211 lb (95.7 kg)  01/16/23 203 lb (92.1 kg)  01/08/23 211 lb (95.7 kg)   Constitutional: overweight, in NAD Eyes: EOMI, no exophthalmos ENT: no thyromegaly, no cervical lymphadenopathy Cardiovascular: RRR, No MRG Respiratory: CTA B Musculoskeletal: no deformities Skin: no rashes Neurological: no tremor with outstretched hands,  Assessment: 1. Hypercalcemia  2. Thyroid nodules  Plan: Patient with history of elevated calcium since 2020, with the highest level being 12.2 but with PTH levels borderline between the lower limit of the target interval and suppressed.  She previously had a PTH of 16 for a calcium of 12.2, but at last visit, her PTH was 29, for an ionized calcium elevated, at 6.0.  Vitamin D levels have been normal or only slightly low (at last visit, after which I recommended to restart vitamin D 1000 units daily - she takes this now).  She has no apparent complications from hypercalcemia: No history of nephrolithiasis, fractures, or osteoporosis.  She does have a history of constipation, which can be in the setting of Mounjaro. -we checked her for primary hyperparathyroidism but the investigation was inconclusive.  She initially had a low 24-hour urine calcium,  normal calcitriol, low phosphate, normal magnesium and a normal vitamin A and PTH RP.  At last visit I tried to obtain a technetium sestamibi scan but this was not covered by her insurance.  We previously checked a bone scan and this was negative for increased uptake.  We then repeated the 24-hour urine calcium and this was normal but the creatinine level was not reported at that time.  My suspicion for FHH (familial hypercalcemic hypercalciuria) is low, since she only started to have elevated serum calcium in 2020, and patient to have this condition usually have high calcium for their entire life.  She previously had elevated free light chains but she was investigated by hematology and was not considered to have multiple myeloma or MGUS.  We checked an SPEP at last visit and this was normal.  She has a degree of renal insufficiency but her GFR was not low enough to explain her significant hypercalcemia. Skeletal survey showed no evidence of multiple myeloma or neoplastic bone disease in 06/2019.  A PET scan showed a 1.1 cm right lung nodule, which was read as possible pulmonary carcinoid. -We discussed at last visit that she will need further investigation for this.  I referred her to pulmonology and she saw Dr. Delton Coombes since last visit.  He suspects carcinoid -plan is to follow-up, but may need tissue diagnosis versus resection.  He did recommend removal of the carcinoid, but likely next year. -We again discussed that carcinoids were previously demonstrated to cause hypercalcemia, but this is not a common occurrence, only case reports.  I did a literature search since our last visit and it appears that the majority of carcinoid related hypercalcemia is caused by elevated PTH RP or direct bone osteolysis.  Few cases have been found in the setting of elevated calcitonin (through elevated PTH) and FGF 23.  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 -At today's visit, I plan to check ionized calcium and PTH along with phosphorus, vitamin D, calcitriol, but also FGF 23, calcitonin, chromogranin.  -I advised her to stay well-hydrated -I will see her back in 6 months  2.  Thyroid nodules -Patient has a history of multinodular goiter, and is status post left lobectomy + isthmusectomy in 2012 -In the right lobe, she has 3 thyroid nodules, which appeared to be benign per review of thethyroid ultrasound report from 10/2019.  One of the nodules was spongiform and the others appeared more likely areas of inflammation. -No neck compression symptoms -TSH was normal at last check.  Will not repeat this today.  3. DM2 -Sugars appear to be well-controlled on oral medication with low-dose metformin and also injectable GLP-1/GIP receptor agonist.  She tolerates Mounjaro well, but with occasional nausea if she eats larger meals.  She also has constipation. -Continues to weight management but gained few pounds since our last visit -Will check another HbA1c at today's visit  - Total time spent for the visit: 50 min, in precharting, postcharting, reviewing obtaining medical information from the chart (including consult notes) and from the pt, reviewing her  previous labs, imaging evaluations, and treatments, reviewing her symptoms, counseling her about her hypercalcemia (please see the discussed topics above), and developing a plan to further investigate and treat them.  Component     Latest Ref Rng 04/23/2023  Hemoglobin A1C     4.6 - 6.5 % 6.4   PTH, Intact     pg/mL CANCELED   PTH, Intact      CANCELED   Vitamin D 1, 25 (OH) Total     18 - 72 pg/mL 39   Vitamin D3 1, 25 (OH)     pg/mL 31   Vitamin D2 1, 25 (OH)     pg/mL 8   VITD     30.00 - 100.00  ng/mL 33.96   Phosphorus     2.3 - 4.6 mg/dL 2.9   Calcium Ionized     4.7 - 5.5 mg/dL 5.9 (H)   Chromogranin A (ng/mL)     0.0 - 101.8 ng/mL 108.5 (H)    Calcitonin CANCELED   FGF-23 Fibroblast Growth Factor     <180 RU/mL 248 (H)   specimen status report Comment   HbA1c is slightly higher than before.  If this continues to increase, we may need to add back insulin. Unfortunately, the urine was not collected for albumin and PTH and calcitonin tests were canceled. However, based on the above information, it appears that her ionized calcium is still high, while phosphorus, vitamin D, and calcitriol levels are normal. Her chromogranin A is only minimally elevated, which is not unexpected in the setting of omeprazole. FGF23 is elevated, slightly more than expected in the setting of CKD stage III,, I review of the literature.  It is possible that the origin of the hormone is  the lung neuroendocrine tumor.  In this case, while not an emergency, I would suggest to try to biopsy and possibly resect the tumor sooner rather than later.  Carlus Pavlov, MD PhD [facility] Endocrinology

## 2023-04-24 ENCOUNTER — Encounter (INDEPENDENT_AMBULATORY_CARE_PROVIDER_SITE_OTHER): Payer: Self-pay | Admitting: Family Medicine

## 2023-04-24 ENCOUNTER — Ambulatory Visit (INDEPENDENT_AMBULATORY_CARE_PROVIDER_SITE_OTHER): Payer: Medicare PPO | Admitting: Family Medicine

## 2023-04-24 VITALS — BP 121/77 | HR 72 | Temp 98.3°F | Ht 65.0 in | Wt 202.0 lb

## 2023-04-24 DIAGNOSIS — Z7985 Long-term (current) use of injectable non-insulin antidiabetic drugs: Secondary | ICD-10-CM | POA: Diagnosis not present

## 2023-04-24 DIAGNOSIS — Z6833 Body mass index (BMI) 33.0-33.9, adult: Secondary | ICD-10-CM | POA: Diagnosis not present

## 2023-04-24 DIAGNOSIS — E669 Obesity, unspecified: Secondary | ICD-10-CM | POA: Diagnosis not present

## 2023-04-24 DIAGNOSIS — Z7984 Long term (current) use of oral hypoglycemic drugs: Secondary | ICD-10-CM | POA: Diagnosis not present

## 2023-04-24 DIAGNOSIS — E559 Vitamin D deficiency, unspecified: Secondary | ICD-10-CM | POA: Diagnosis not present

## 2023-04-24 DIAGNOSIS — E1169 Type 2 diabetes mellitus with other specified complication: Secondary | ICD-10-CM | POA: Diagnosis not present

## 2023-04-24 NOTE — Progress Notes (Signed)
Sarah [name], D.O.  ABFM, ABOM Specializing in Clinical Bariatric Medicine  Office located at: [address] [place], [place]  [address]   Assessment and Plan:   FOR THE DISEASE OF OBESITY: BMI 33.0-33.9,adult- current bmi 33.61 Obesity with starting BMI 37.9/date 02/15/2020  Since last office visit on 04/09/23 patient's  Muscle mass has increased by 2.4 lb. Fat mass has decreased by 2 lb. Total body water has decreased by 0.6 lb.  Counseling done on how various foods will affect these numbers and how to maximize success  Total lbs lost to date: 26 lbs  Total weight loss percentage to date: 11.40%    Recommended Dietary Goals Sarah [name] is currently in the action stage of change. As such, her goal is to continue weight management plan.  She has agreed to: continue current plan   Behavioral Intervention We discussed the following Behavioral Modification Strategies today: work on tracking and journaling calories using tracking application.  Additional resources provided today: Handout on thanksgiving eating strategies.   Evidence-based interventions for health behavior change were utilized today including the discussion of self monitoring techniques, problem-solving barriers and SMART goal setting techniques.   Regarding patient's less desirable eating habits and patterns, we employed the technique of small changes.   Pt will specifically work on: journaling intake/bringing in food log   Recommended Physical Activity Goals Sarah [name] has been advised to work up to 150 minutes of moderate intensity aerobic activity a week and strengthening exercises 2-3 times per week for cardiovascular health, weight loss maintenance and preservation of muscle mass.   She has agreed to : increase the intensity, frequency or duration of exercises and exploring Females in [employer]   Pharmacotherapy We both agreed to : continue current anti-obesity medication regimen   FOR ASSOCIATED  CONDITIONS ADDRESSED TODAY:  Type 2 diabetes mellitus with obesity (HCC) Assessment & Plan: T2DM treated with Metformin 500 mg once a day and Mounjaro 12.5 mg weekly. Her A1c has been trending upward in the past year. Her most recent A1c with endocrinologist was 6.4 on 04/23/23.  Intensive lifestyle modification including diet, exercise and weight loss are the first line of treatment for diabetes. We extensively discussed the importance of decreasing simple carbs, increasing proteins and how certain foods can affect this condition. Maintain with Metformin at current dose. Since pt is already having a difficult time eating enough food during the day,  we don't recommend an increase in Sarah [name]. Additionally, we recommend she discuss with endocrinologist about possibly adding an SGLT-2 inhibitor to her medication regimen.    Vitamin D deficiency Assessment & Plan: Condition managed by endocrinologist. ENDO is currently working her up for hypercalcemia. Most recent vitamin D of 33.96. She reports good compliance and tolerance with OTC cholecalciferol 1,000 units daily.   Continue with weight loss efforts and all treatment per Endocrinology.   FOLLOW UP: Return 05/07/23. She was informed of the importance of frequent follow up visits to maximize her success with intensive lifestyle modifications for her multiple health conditions.  Subjective:   Chief complaint: Obesity Sarah [name] is here to discuss her progress with her obesity treatment plan. She is on the Category 1 Plan and keeping a food journal and adhering to recommended goals of 1000-1000 calories and 85+ protein and states she is following her eating plan approximately 60% of the time. She states she is walking 60 minutes, 3 days a week & doing silver sneakers 50 minutes, 2 days a week.   Interval History:  Sarah [name] is here for a follow up office visit. Since last OV,  Sarah [name]'s weight has not changed. She did not journal her intake  this time because she got a new phone. No complaints of hunger and cravings.   Pharmacotherapy for weight loss: She is currently taking Metformin and  Monjauro with diabetes as the primary indication.  Review of Systems:  Pertinent positives were addressed with patient today.  Reviewed by clinician on day of visit: allergies, medications, problem list, medical history, surgical history, family history, social history, and previous encounter notes.  Weight Summary and Biometrics   Weight Lost Since Last Visit: 0lb  Weight Gained Since Last Visit: 0lb   Vitals Temp: 98.3 F (36.8 C) BP: 121/77 Pulse Rate: 72 SpO2: 96 %   Anthropometric Measurements Height: 5\' 5"  (1.651 m) Weight: 202 lb (91.6 kg) BMI (Calculated): 33.61 Weight at Last Visit: 202lb Weight Lost Since Last Visit: 0lb Weight Gained Since Last Visit: 0lb Starting Weight: 228lb Total Weight Loss (lbs): 26 lb (11.8 kg) Peak Weight: 228lb   Body Composition  Body Fat %: 38.9 % Fat Mass (lbs): 78.8 lbs Muscle Mass (lbs): 117.8 lbs Total Body Water (lbs): 73 lbs Visceral Fat Rating : 11   Other Clinical Data Fasting: no Labs: no Today's Visit #: 72 Starting Date: 02/15/20   Objective:   PHYSICAL EXAM: Blood pressure 121/77, pulse 72, temperature 98.3 F (36.8 C), height 5\' 5"  (1.651 m), weight 202 lb (91.6 kg), SpO2 96%. Body mass index is 33.61 kg/m.  General: she is overweight, cooperative and in no acute distress. PSYCH: Has normal mood, affect and thought process.   HEENT: EOMI, sclerae are anicteric. Lungs: Normal breathing effort, no conversational dyspnea. Extremities: Moves * 4 Neurologic: A and O * 3, good insight  DIAGNOSTIC DATA REVIEWED: BMET    Component Value Date/Time   NA 141 06/21/2022 1616   NA 138 04/22/2016 0750   K 4.2 06/21/2022 1616   K 3.9 04/22/2016 0750   CL 102 06/21/2022 1616   CO2 18 (L) 06/21/2022 1616   CO2 26 04/22/2016 0750   GLUCOSE 102 (H) 06/21/2022  1616   GLUCOSE 81 03/28/2022 1508   GLUCOSE 142 (H) 04/22/2016 0750   BUN 20 06/21/2022 1616   BUN 19.1 11/20/2016 1355   CREATININE 1.22 (H) 06/21/2022 1616   CREATININE 1.26 (H) 12/29/2019 1015   CREATININE 1.1 11/20/2016 1355   CALCIUM 12.2 (H) 06/21/2022 1616   CALCIUM 9.8 04/22/2016 0750   GFRNONAA 47 (L) 03/28/2022 1508   GFRNONAA 48 (L) 12/29/2019 1015   GFRNONAA 63 07/08/2014 1212   GFRAA 69 04/17/2020 0928   GFRAA 56 (L) 12/29/2019 1015   GFRAA 73 07/08/2014 1212   Lab Results  Component Value Date   HGBA1C 6.4 04/23/2023   HGBA1C 10.1 (H) 07/07/2015   No results found for: "INSULIN" Lab Results  Component Value Date   TSH 1.370 06/21/2022   CBC    Component Value Date/Time   WBC 5.4 03/28/2022 1508   RBC 4.97 03/28/2022 1508   HGB 15.0 03/28/2022 1508   HGB 13.3 02/03/2020 0837   HGB 13.6 12/04/2018 0914   HGB 12.9 04/22/2016 0750   HCT 46.8 (H) 03/28/2022 1508   HCT 41.0 12/04/2018 0914   HCT 39.0 04/22/2016 0750   PLT 318 03/28/2022 1508   PLT 270 02/03/2020 0837   PLT 332 12/04/2018 0914   MCV 94.2 03/28/2022 1508   MCV 88 12/04/2018 0914  MCV 92.2 04/22/2016 0750   MCH 30.2 03/28/2022 1508   MCHC 32.1 03/28/2022 1508   RDW 13.6 03/28/2022 1508   RDW 13.6 12/04/2018 0914   RDW 13.9 04/22/2016 0750   Iron Studies    Component Value Date/Time   IRON 43 08/18/2010 0515   TIBC 389 08/18/2010 0515   FERRITIN 227 08/18/2010 0515   IRONPCTSAT 11 (L) 08/18/2010 0515   Lipid Panel     Component Value Date/Time   CHOL 161 01/20/2023 1032   TRIG 183 (H) 01/20/2023 1032   HDL 46 01/20/2023 1032   CHOLHDL 3.5 01/20/2023 1032   CHOLHDL 5.1 (H) 08/14/2016 1729   VLDL 74 (H) 08/14/2016 1729   LDLCALC 84 01/20/2023 1032   Hepatic Function Panel     Component Value Date/Time   PROT 7.7 10/21/2022 1216   PROT 8.2 04/22/2016 0750   ALBUMIN 4.6 01/09/2022 0855   ALBUMIN 3.8 04/22/2016 0750   AST 26 01/09/2022 0855   AST 29 12/29/2019 1015   AST  27 04/22/2016 0750   ALT 29 01/09/2022 0855   ALT 33 12/29/2019 1015   ALT 24 04/22/2016 0750   ALKPHOS 74 01/09/2022 0855   ALKPHOS 74 04/22/2016 0750   BILITOT 0.5 01/09/2022 0855   BILITOT 0.6 12/29/2019 1015   BILITOT 0.44 04/22/2016 0750   BILIDIR 0.2 08/18/2010 0515   IBILI 1.5 (H) 08/18/2010 0515      Component Value Date/Time   TSH 1.370 06/21/2022 1616   Nutritional Lab Results  Component Value Date   VD25OH 33.96 04/23/2023   VD25OH 29.26 (L) 10/21/2022   VD25OH 31.2 06/21/2022    Attestations:   I, Sarah [name], acting as a Stage manager for Sarah Lot, DO., have compiled all relevant documentation for today's office visit on behalf of Sarah Lot, DO, while in the presence of [employer], DO.  I have reviewed the above documentation for accuracy and completeness, and I agree with the above. Sarah [name], D.O.  The 21st Century Cures Act was signed into law in 2016 which includes the topic of electronic health records.  This provides immediate access to information in MyChart.  This includes consultation notes, operative notes, office notes, lab results and pathology reports.  If you have any questions about what you read please let us know at your next visit so we can discuss your concerns and take corrective action if need be.  We are right here with you.

## 2023-04-25 LAB — CHROMOGRANIN A: Chromogranin A (ng/mL): 108.5 ng/mL — ABNORMAL HIGH (ref 0.0–101.8)

## 2023-04-29 ENCOUNTER — Other Ambulatory Visit: Payer: Self-pay | Admitting: Internal Medicine

## 2023-04-29 DIAGNOSIS — E669 Obesity, unspecified: Secondary | ICD-10-CM

## 2023-04-29 LAB — VITAMIN D 1,25 DIHYDROXY
Vitamin D 1, 25 (OH)2 Total: 39 pg/mL (ref 18–72)
Vitamin D2 1, 25 (OH)2: 8 pg/mL
Vitamin D3 1, 25 (OH)2: 31 pg/mL

## 2023-04-29 LAB — FIBROBLAST GROWTH FACTOR 23: FGF-23 Fibroblast Growth Factor: 248 RU/mL — ABNORMAL HIGH (ref <180)

## 2023-04-29 LAB — CALCITONIN

## 2023-04-29 LAB — PARATHYROID HORMONE, INTACT (NO CA)

## 2023-04-29 LAB — CALCIUM, IONIZED: Calcium, Ion: 5.9 mg/dL — ABNORMAL HIGH (ref 4.7–5.5)

## 2023-04-29 MED ORDER — FREESTYLE LIBRE READER DEVI: 0 refills | Status: DC

## 2023-04-30 LAB — SPECIMEN STATUS REPORT

## 2023-04-30 LAB — PARATHYROID HORMONE, INTACT (NO CA)

## 2023-05-07 ENCOUNTER — Ambulatory Visit (INDEPENDENT_AMBULATORY_CARE_PROVIDER_SITE_OTHER): Payer: Medicare PPO | Admitting: Family Medicine

## 2023-05-07 ENCOUNTER — Other Ambulatory Visit (HOSPITAL_COMMUNITY): Payer: Self-pay

## 2023-05-07 ENCOUNTER — Encounter (INDEPENDENT_AMBULATORY_CARE_PROVIDER_SITE_OTHER): Payer: Self-pay | Admitting: Family Medicine

## 2023-05-07 VITALS — BP 131/84 | HR 79 | Temp 97.6°F | Ht 65.0 in | Wt 202.0 lb

## 2023-05-07 DIAGNOSIS — E1169 Type 2 diabetes mellitus with other specified complication: Secondary | ICD-10-CM

## 2023-05-07 DIAGNOSIS — J3089 Other allergic rhinitis: Secondary | ICD-10-CM

## 2023-05-07 DIAGNOSIS — Z6833 Body mass index (BMI) 33.0-33.9, adult: Secondary | ICD-10-CM

## 2023-05-07 DIAGNOSIS — Z7985 Long-term (current) use of injectable non-insulin antidiabetic drugs: Secondary | ICD-10-CM

## 2023-05-07 DIAGNOSIS — E669 Obesity, unspecified: Secondary | ICD-10-CM

## 2023-05-07 DIAGNOSIS — E559 Vitamin D deficiency, unspecified: Secondary | ICD-10-CM

## 2023-05-07 DIAGNOSIS — Z9109 Other allergy status, other than to drugs and biological substances: Secondary | ICD-10-CM

## 2023-05-07 DIAGNOSIS — F39 Unspecified mood [affective] disorder: Secondary | ICD-10-CM

## 2023-05-07 DIAGNOSIS — F5089 Other specified eating disorder: Secondary | ICD-10-CM | POA: Diagnosis not present

## 2023-05-07 DIAGNOSIS — Z7984 Long term (current) use of oral hypoglycemic drugs: Secondary | ICD-10-CM | POA: Diagnosis not present

## 2023-05-07 MED ORDER — LEVOCETIRIZINE DIHYDROCHLORIDE 5 MG PO TABS: 5.0000 mg | ORAL_TABLET | Freq: Every evening | ORAL | 0 refills | Status: DC

## 2023-05-07 MED ORDER — TIRZEPATIDE 15 MG/0.5ML ~~LOC~~ SOAJ
15.0000 mg | SUBCUTANEOUS | 1 refills | Status: DC
  Filled 2023-05-07: qty 2, 28d supply, fill #0

## 2023-05-07 MED ORDER — BUPROPION HCL ER (SR) 200 MG PO TB12: 200.0000 mg | ORAL_TABLET | Freq: Two times a day (BID) | ORAL | 0 refills | Status: DC

## 2023-05-07 NOTE — Progress Notes (Signed)
[name], D.O.  ABFM, ABOM Specializing in Clinical Bariatric Medicine  Office located at: [address] [place], [place]  [address]   Assessment and Plan:   FOR THE DISEASE OF OBESITY: BMI 33.0-33.9,adult- current bmi 33.61 Obesity with starting BMI 37.9/date 02/15/2020 Since last office visit on 04/24/2023 patient's  Muscle mass has decreased by 2.4lb. Fat mass has increased by 2lb. Total body water has increased by 1lb.  Counseling done on how various foods will affect these numbers and how to maximize success  Total lbs lost to date: 26 Total weight loss percentage to date: 11.40%    Recommended Dietary Goals Sarah [name] is currently in the action stage of change. As such, her goal is to continue weight management plan.  She has agreed to: keep a food journal with a target of  [phone] calories and 30-40 grams of protein per meal and follow the Category 1 plan - 1000 kcal per day   Behavioral Intervention We discussed the following today: increasing lean protein intake to established goals, decreasing simple carbohydrates , increasing vegetables, work on tracking and journaling calories using tracking application, continue to work on implementation of reduced calorie nutritional plan, and continue to practice mindfulness when eating  Additional resources provided today: None  Evidence-based interventions for health behavior change were utilized today including the discussion of self monitoring techniques, problem-solving barriers and SMART goal setting techniques.   Regarding patient's less desirable eating habits and patterns, we employed the technique of small changes.   Pt will specifically work on: journal consistently for next visit.    Recommended Physical Activity Goals Sarah [name] has been advised to work up to 150 minutes of moderate intensity aerobic activity a week and strengthening exercises 2-3 times per week for cardiovascular health, weight loss maintenance  and preservation of muscle mass.   She has agreed to :  Continue current level of physical activity  and continue to gradually increase the amount and intensity of exercise    Pharmacotherapy We discussed various medication options to help Sarah [name] with her weight loss efforts and we both agreed to : continue with nutritional and behavioral strategies and continue current anti-obesity medication regimen   FOR ASSOCIATED CONDITIONS ADDRESSED TODAY: Vitamin D deficiency Assessment: Condition is Improving, but not optimized.. Labs were reviewed. Pt vitamin D level has improved from 29.26 to 33.96 on 04/23/2023. She continues to take OTC oral vitamin D supplement.  Lab Results  Component Value Date   VD25OH 33.96 04/23/2023   VD25OH 29.26 (L) 10/21/2022   VD25OH 31.2 06/21/2022   Plan: Continue with OTC cholecalciferol at current dose as directed. she was encouraged to continue to take the medicine until told otherwise. Weight loss will likely improve availability of vitamin D, thus encouraged  to continue with meal plan and their weight loss efforts to further improve this condition.   Type 2 diabetes mellitus with obesity (HCC) Assessment: Condition is Not optimized.Marland Kitchen Pt A1c increased from 6.3 to 6.4 as of 04/23/2023. Patient reports good compliance and tolerance of taking Metformin and Mounjaro. She tolerates these well and denies any GI upset or N/V/D. She endorses the [facility] helping control her hunger and cravings but still has some. Pt monitors her blood sugar at home and she averages under 90.  Lab Results  Component Value Date   HGBA1C 6.4 04/23/2023   HGBA1C 6.3 01/20/2023   HGBA1C 6.0 09/17/2022    Plan: Continue with metformin 500mg  once daily with breakfast and increase her  Mounjaro from 12.5mg  to 15mg  once weekly. Continue her prudent nutritional plan that is low in simple carbohydrates, saturated fats and trans fats to goal of 5-10% weight loss to achieve significant  health benefits.  Pt encouraged to continually advance exercise and cardiovascular fitness as tolerated throughout weight loss journey. Type 2 diabetes mellitus with obesity (HCC) -     Tirzepatide; Inject 15 mg into the skin once a week. Q thursday  Dispense: 2 mL; Refill: 1   Depressed mood (HCC) with emotional eating Assessment: Condition is Improving, but not optimized.. Denies any SI/HI. Mood is stable. Pt endorses that Wellbutrin has significantly helped with her mood and has made her less snappy. However, she notes no change in her cravings and hunger. We last increased her Wellbutrin on 01/16/2023 to 200mg  BID.   Plan:  Continue with Wellbutrin at current dose for now. Reminded patient of the importance of following their prudent nutrition plan and how food can affect mood as well to support emotional wellbeing.  Depressed mood (HCC) with emotional eating -     buPROPion HCl ER (SR); Take 1 tablet (200 mg total) by mouth 2 (two) times daily.  Dispense: 60 tablet; Refill: 0   Environmental allergies Assessment & Plan: Condition is Controlled.. Pt endorses her allergies are well controlled with Xyzal. She tolerates this well and request a refill.   Environmental allergies -     Levocetirizine Dihydrochloride; Take 1 tablet (5 mg total) by mouth every evening.  Dispense: 90 tablet; Refill: 0  Follow up:   Return in about 2 weeks (around 05/21/2023). She was informed of the importance of frequent follow up visits to maximize her success with intensive lifestyle modifications for her multiple health conditions.  Subjective:   Chief complaint: Obesity Sarah [name] is here to discuss her progress with her obesity treatment plan. She is on the the Category 1 Plan and keeping a food journal and adhering to recommended goals of 1000-1000 calories and 85+ protein and states she is following her eating plan approximately 70-80% of the time. She states she is silver sneaker 50 minutes 3 days per  week and walking 60 minutes 2 days per week.  Interval History:  Sarah [name] is here for a follow up office visit. Since last OV,  she has been well. She has been journaling her food intake. She thought she was meeting her calorie goals but since journaling has noticed that she is significantly under calories. She admits to eating rice and salmon patties with flour and egg but didn't count those calories.    Barriers identified:  Under calories .   Pharmacotherapy for weight loss: She is currently taking Metformin (off label use for incretin effect and / or insulin resistance and / or diabetes prevention) with adequate clinical response  and without side effects. and Monjauro with diabetes as the primary indication with adequate clinical response  and without side effects..   Review of Systems:  Pertinent positives were addressed with patient today.  Reviewed by clinician on day of visit: allergies, medications, problem list, medical history, surgical history, family history, social history, and previous encounter notes.  Weight Summary and Biometrics   Weight Lost Since Last Visit: 0lb  Weight Gained Since Last Visit: 0lb    Vitals Temp: 97.6 F (36.4 C) BP: 131/84 Pulse Rate: 79 SpO2: 99 %   Anthropometric Measurements Height: 5\' 5"  (1.651 m) Weight: 202 lb (91.6 kg) BMI (Calculated): 33.61 Weight at Last Visit: 202lb Weight Lost  Since Last Visit: 0lb Weight Gained Since Last Visit: 0lb Starting Weight: 228lb Total Weight Loss (lbs): 26 lb (11.8 kg) Peak Weight: 228lb   Body Composition  Body Fat %: 39.9 % Fat Mass (lbs): 80.8 lbs Muscle Mass (lbs): 115.4 lbs Total Body Water (lbs): 74 lbs Visceral Fat Rating : 11   Other Clinical Data Fasting: no Labs: no Today's Visit #: 73 Starting Date: 02/15/20    Objective:   PHYSICAL EXAM: Blood pressure 131/84, pulse 79, temperature 97.6 F (36.4 C), height 5\' 5"  (1.651 m), weight 202 lb (91.6 kg), SpO2  99%. Body mass index is 33.61 kg/m.  General: she is overweight, cooperative and in no acute distress. PSYCH: Has normal mood, affect and thought process.   HEENT: EOMI, sclerae are anicteric. Lungs: Normal breathing effort, no conversational dyspnea. Extremities: Moves * 4 Neurologic: A and O * 3, good insight  DIAGNOSTIC DATA REVIEWED: BMET    Component Value Date/Time   NA 141 06/21/2022 1616   NA 138 04/22/2016 0750   K 4.2 06/21/2022 1616   K 3.9 04/22/2016 0750   CL 102 06/21/2022 1616   CO2 18 (L) 06/21/2022 1616   CO2 26 04/22/2016 0750   GLUCOSE 102 (H) 06/21/2022 1616   GLUCOSE 81 03/28/2022 1508   GLUCOSE 142 (H) 04/22/2016 0750   BUN 20 06/21/2022 1616   BUN 19.1 11/20/2016 1355   CREATININE 1.22 (H) 06/21/2022 1616   CREATININE 1.26 (H) 12/29/2019 1015   CREATININE 1.1 11/20/2016 1355   CALCIUM 12.2 (H) 06/21/2022 1616   CALCIUM 9.8 04/22/2016 0750   GFRNONAA 47 (L) 03/28/2022 1508   GFRNONAA 48 (L) 12/29/2019 1015   GFRNONAA 63 07/08/2014 1212   GFRAA 69 04/17/2020 0928   GFRAA 56 (L) 12/29/2019 1015   GFRAA 73 07/08/2014 1212   Lab Results  Component Value Date   HGBA1C 6.4 04/23/2023   HGBA1C 10.1 (H) 07/07/2015   No results found for: "INSULIN" Lab Results  Component Value Date   TSH 1.370 06/21/2022   CBC    Component Value Date/Time   WBC 5.4 03/28/2022 1508   RBC 4.97 03/28/2022 1508   HGB 15.0 03/28/2022 1508   HGB 13.3 02/03/2020 0837   HGB 13.6 12/04/2018 0914   HGB 12.9 04/22/2016 0750   HCT 46.8 (H) 03/28/2022 1508   HCT 41.0 12/04/2018 0914   HCT 39.0 04/22/2016 0750   PLT 318 03/28/2022 1508   PLT 270 02/03/2020 0837   PLT 332 12/04/2018 0914   MCV 94.2 03/28/2022 1508   MCV 88 12/04/2018 0914   MCV 92.2 04/22/2016 0750   MCH 30.2 03/28/2022 1508   MCHC 32.1 03/28/2022 1508   RDW 13.6 03/28/2022 1508   RDW 13.6 12/04/2018 0914   RDW 13.9 04/22/2016 0750   Iron Studies    Component Value Date/Time   IRON 43  08/18/2010 0515   TIBC 389 08/18/2010 0515   FERRITIN 227 08/18/2010 0515   IRONPCTSAT 11 (L) 08/18/2010 0515   Lipid Panel     Component Value Date/Time   CHOL 161 01/20/2023 1032   TRIG 183 (H) 01/20/2023 1032   HDL 46 01/20/2023 1032   CHOLHDL 3.5 01/20/2023 1032   CHOLHDL 5.1 (H) 08/14/2016 1729   VLDL 74 (H) 08/14/2016 1729   LDLCALC 84 01/20/2023 1032   Hepatic Function Panel     Component Value Date/Time   PROT 7.7 10/21/2022 1216   PROT 8.2 04/22/2016 0750   ALBUMIN 4.6 01/09/2022 0855  ALBUMIN 3.8 04/22/2016 0750   AST 26 01/09/2022 0855   AST 29 12/29/2019 1015   AST 27 04/22/2016 0750   ALT 29 01/09/2022 0855   ALT 33 12/29/2019 1015   ALT 24 04/22/2016 0750   ALKPHOS 74 01/09/2022 0855   ALKPHOS 74 04/22/2016 0750   BILITOT 0.5 01/09/2022 0855   BILITOT 0.6 12/29/2019 1015   BILITOT 0.44 04/22/2016 0750   BILIDIR 0.2 08/18/2010 0515   IBILI 1.5 (H) 08/18/2010 0515      Component Value Date/Time   TSH 1.370 06/21/2022 1616   Nutritional Lab Results  Component Value Date   VD25OH 33.96 04/23/2023   VD25OH 29.26 (L) 10/21/2022   VD25OH 31.2 06/21/2022    Attestations:   I, Clinical biochemist, acting as a Stage manager for [employer], DO., have compiled all relevant documentation for today's office visit on behalf of Thomasene Lot, DO, while in the presence of [employer], DO.  Reviewed by clinician on day of visit: allergies, medications, problem list, medical history, surgical history, family history, social history, and previous encounter notes pertinent to patient's obesity diagnosis. preparing to see patient (e.g. review and interpretation of tests, old notes ), obtaining and/or reviewing separately obtained history, performing a medically appropriate examination or evaluation, counseling and educating the patient, ordering medications, test or procedures, documenting clinical information in the electronic or other health care record, and  independently interpreting results and communicating results to the patient, family, or caregiver   I have reviewed the above documentation for accuracy and completeness, and I agree with the above. [name], D.O.  The 21st Century Cures Act was signed into law in 2016 which includes the topic of electronic health records.  This provides immediate access to information in MyChart.  This includes consultation notes, operative notes, office notes, lab results and pathology reports.  If you have any questions about what you read please let us know at your next visit so we can discuss your concerns and take corrective action if need be.  We are right here with you.

## 2023-05-13 ENCOUNTER — Other Ambulatory Visit: Payer: Self-pay | Admitting: Pharmacist

## 2023-05-13 MED ORDER — FREESTYLE LIBRE 14 DAY READER DEVI: 0 refills | Status: DC

## 2023-05-13 MED ORDER — FREESTYLE LIBRE 14 DAY SENSOR MISC: 6 refills | Status: DC

## 2023-05-15 ENCOUNTER — Other Ambulatory Visit: Payer: Self-pay | Admitting: Pharmacist

## 2023-05-15 ENCOUNTER — Other Ambulatory Visit: Payer: Self-pay | Admitting: Internal Medicine

## 2023-05-15 ENCOUNTER — Other Ambulatory Visit: Payer: Self-pay

## 2023-05-15 ENCOUNTER — Telehealth: Payer: Self-pay | Admitting: Pharmacist

## 2023-05-15 DIAGNOSIS — I10 Essential (primary) hypertension: Secondary | ICD-10-CM

## 2023-05-15 MED ORDER — FREESTYLE LIBRE 3 READER DEVI: 0 refills | Status: DC

## 2023-05-15 MED ORDER — FREESTYLE LIBRE 3 SENSOR MISC: 6 refills | Status: DC

## 2023-05-15 NOTE — Telephone Encounter (Signed)
Hey friend -   Can we start a PA for both the [place] 3 sensors and receiver?

## 2023-05-16 ENCOUNTER — Other Ambulatory Visit: Payer: Self-pay

## 2023-05-21 ENCOUNTER — Ambulatory Visit (INDEPENDENT_AMBULATORY_CARE_PROVIDER_SITE_OTHER): Payer: Medicare PPO | Admitting: Family Medicine

## 2023-05-21 ENCOUNTER — Other Ambulatory Visit (HOSPITAL_COMMUNITY): Payer: Self-pay

## 2023-05-21 ENCOUNTER — Encounter (INDEPENDENT_AMBULATORY_CARE_PROVIDER_SITE_OTHER): Payer: Self-pay | Admitting: Family Medicine

## 2023-05-21 VITALS — BP 166/108 | HR 72 | Temp 97.9°F | Ht 65.0 in | Wt 203.0 lb

## 2023-05-21 DIAGNOSIS — Z7985 Long-term (current) use of injectable non-insulin antidiabetic drugs: Secondary | ICD-10-CM

## 2023-05-21 DIAGNOSIS — E1159 Type 2 diabetes mellitus with other circulatory complications: Secondary | ICD-10-CM

## 2023-05-21 DIAGNOSIS — I152 Hypertension secondary to endocrine disorders: Secondary | ICD-10-CM | POA: Diagnosis not present

## 2023-05-21 DIAGNOSIS — R0602 Shortness of breath: Secondary | ICD-10-CM | POA: Diagnosis not present

## 2023-05-21 DIAGNOSIS — Z6833 Body mass index (BMI) 33.0-33.9, adult: Secondary | ICD-10-CM | POA: Diagnosis not present

## 2023-05-21 DIAGNOSIS — E669 Obesity, unspecified: Secondary | ICD-10-CM | POA: Diagnosis not present

## 2023-05-21 DIAGNOSIS — F39 Unspecified mood [affective] disorder: Secondary | ICD-10-CM | POA: Diagnosis not present

## 2023-05-21 DIAGNOSIS — Z7984 Long term (current) use of oral hypoglycemic drugs: Secondary | ICD-10-CM

## 2023-05-21 DIAGNOSIS — E1169 Type 2 diabetes mellitus with other specified complication: Secondary | ICD-10-CM | POA: Diagnosis not present

## 2023-05-21 MED ORDER — BUPROPION HCL ER (SR) 200 MG PO TB12
200.0000 mg | ORAL_TABLET | Freq: Two times a day (BID) | ORAL | 0 refills | Status: DC
  Filled 2023-05-21: qty 60, 30d supply, fill #0

## 2023-05-21 MED ORDER — TIRZEPATIDE 15 MG/0.5ML ~~LOC~~ SOAJ
15.0000 mg | SUBCUTANEOUS | 0 refills | Status: DC
  Filled 2023-05-21: qty 2, 28d supply, fill #0

## 2023-05-21 NOTE — Progress Notes (Incomplete)
Sarah [name], D.O.  ABFM, ABOM Specializing in Clinical Bariatric Medicine  Office located at: [address] [place], [place]  [address]   Assessment and Plan:   FOR THE DISEASE OF OBESITY: Since last office visit on 05/07/23 patient's muscle mass has increased by 0.4lb. Fat mass has increased by 1lb. Total body water has increased by 1.4lb.  Counseling done on how various foods will affect these numbers and how to maximize success  Total lbs lost to date: 25 lbs Total weight loss percentage to date: -10.96%   Sarah [name] will be traveling to [facility] for a full week of vacation on 12/21. She states she will be doing a lot of walking and staying active while on this trip. She plans to review healthy options at restaurants ahead of the trip since she is traveling by car and will likely have to make multiple stops.   Extensively reviewed vacationing and holiday strategies. I advise she pack her meals and snacks in a cooler with her on vacation and discussed possible food options to bring (tuna packets, wraps, sandwiches, cheese sticks, etc.). I recommend she practice mindful eating by following her meal plan and continue exercising.   Recommended Dietary Goals Sarah [name] is currently in the action stage of change. As such, her goal is to continue weight management plan.  She has agreed to: switch to Category 3 meal plan  with no snack calories.  Reviewed options with pt. Pt is not interested in journaling.   Behavioral Intervention We discussed the following today: decreasing eating out or consumption of processed foods, and making healthy choices when eating convenient foods, work on managing stress, creating time for self-care and relaxation, continue to practice mindfulness when eating, planning for success, staying on track while traveling and vacationing, and celebration eating strategies  Additional resources provided today: Handout on practicing mindfulness around  eating  Evidence-based interventions for health behavior change were utilized today including the discussion of self monitoring techniques, problem-solving barriers and SMART goal setting techniques.   Regarding patient's less desirable eating habits and patterns, we employed the technique of small changes.   Pt will specifically work on: eating healthy options while on vacation and continue to exercise for next visit.    Recommended Physical Activity Goals Sarah [name] has been advised to work up to 150 minutes of moderate intensity aerobic activity a week and strengthening exercises 2-3 times per week for cardiovascular health, weight loss maintenance and preservation of muscle mass.   She has agreed to :  Continue current level of physical activity    Pharmacotherapy We discussed various medication options to help Sarah [name] with her weight loss efforts and we both agreed to : continue with nutritional and behavioral strategies and continue current anti-obesity medication regimen   FOR ASSOCIATED CONDITIONS ADDRESSED TODAY: Type 2 diabetes mellitus with obesity (HCC) Assessment & Plan: Lab Results  Component Value Date   HGBA1C 6.4 04/23/2023   HGBA1C 6.3 01/20/2023   HGBA1C 6.0 09/17/2022   Last A1C on 04/23/23 was 6.4 with Dr. Elvera Lennox of endocrinology. She is treating her condition with Metformin and Mounjaro. Tolerating well with no side effects reported. Has been monitoring her sugars, reports a reading of 107 this morning. Last visit, we increased her Mounjaro from 12.5 mg to 15 mg once daily. She endorses much better control of cravings and hunger on this dose.   Continue on current medication regimen. We will continue to monitor her condition alongside PCP/specialist. Continue to follow up  with endocrinology/PCP as directed. Refill Mounjaro today.   Orders: -     Tirzepatide; Inject 15 mg into the skin once a week every Thursday  Dispense: 2 mL; Refill: 0   Hypertension associated  with type 2 diabetes mellitus (HCC) BP Readings from Last 3 Encounters:  05/21/23 (!) 166/108  05/07/23 131/84  04/24/23 121/77   BP was elevated today which she attributes to not taking her antihypertensives this morning. With her Indirect Calorimeter test today she was unsure if she could eat, drink water, or take her antihypertensives. She is on amlodipine, hydralazine, losartan, and lopressor.  Continue on current medication regimen as directed by PCP/specialists. We will continue to monitor her condition as it relates to her weight loss journey.    Depressed mood (HCC) with emotional eating Assessment & Plan: Sarah [name] is managing her condition with Wellbutrin SR 200 mg BID. Tolerating well with no side effects reported. Moods are currently stable. She does, however, reports recent emotional distress, stating she "got her feelings hurt" in a recent interpersonal interaction and as a result has struggled with increased emotional eating in the last 2 weeks.   Continue on current medication regimen as directed. Practice mindful eating habits while vacationing. I recommend she work towards communication to resolve conflicts in an effort to prioritize her mental wellbeing and overall health. Will refill Wellbutrin SR today.   Orders: -     buPROPion HCl ER (SR); Take 1 tablet (200 mg total) by mouth 2 (two) times daily.  Dispense: 60 tablet; Refill: 0   SOB on exertion:  Assessment & Plan:  Condition is Not optimized.  Sarah [name] does feel that she easily gets out of breath when walking up two flights of stairs and this is concerning to the her.  It appears Sarah [name] shortness of breath continues to be obesity related and exercise induced.  she does not appear to have any "red flag" symptoms/ concerns today.  No chest pain, heart palpitations or dizziness.   Also, this condition appears to be improving from prior as she enhances her cardiovascular health and fitness.   Chronic SOB on  exertion has not improved over time. Has lung nodule being followed by several specialists. Symptoms have not acutely worsened and are stable.   Indirect Calorimeter performed today in office and interpreted by myself- see details below.  This information is a critical component in devising her nutritional plan today.  Counseling was done with patient on these findings and on various ways to make these results more favorable for weight loss. Continue to follow up with PCP/specialists as directed for management of lung nodule.  Indirect Calorimeter completed today to help guide our dietary regimen. It shows a VO2 of 270 and a REE of 1858.  Her calculated basal metabolic rate is 6160 thus her measured basal metabolic rate is better than expected. Her BMR is much better than prior (1440 on 02/04/22).   Patient agreed to continue to work on weight loss at this time via a prudent nutritional plan and regular exercise.  As Sarah [name] progresses, we will increase exercise intensity and frequency as tolerated to treat her current condition.   If Sarah [name] follows our recommendations and loses 5-10% of their weight without improvement of her shortness of breath or if at any time, symptoms become more concerning, they agree to urgently follow up with their PCP/ specialist for further consideration/ evaluation.   Sarah [name] verbalizes agreement with this plan.   Follow up:   Return  in about 19 days (around 06/09/2023).  She was informed of the importance of frequent follow up visits to maximize her success with intensive lifestyle modifications for her multiple health conditions.  Subjective:   Chief complaint: Obesity Sarah [name] is here to discuss her progress with her obesity treatment plan. She is on the the Category 1 Plan and keeping a food journal and adhering to recommended goals of 1000-1100 calories and 85+ g protein and states she is following her eating plan approximately 50% of the time. She states she is walking 30  minutes 5 days per week and Silver Sneakers for about 1 hour 3 days per week.   Interval History:  Sarah [name] is here for a follow up office visit. Since last OV, she has not been journaling and notes she is not interested in journaling. She typically eats 8-10 oz of protein at dinnertime. Plans to eat healthy food options while on vacation later this month.   Barriers identified: having difficulty focusing on healthy eating and moderate to high levels of stress.   Pharmacotherapy for weight loss: She is currently taking Metformin (off label use for incretin effect and / or insulin resistance and / or diabetes prevention) with adequate clinical response  and without side effects., Bupropion (single agent, off label use) with adequate clinical response  and without side effects., and Monjauro with diabetes as the primary indication with adequate clinical response  and without side effects..   Review of Systems:  Pertinent positives were addressed with patient today.  Reviewed by clinician on day of visit: allergies, medications, problem list, medical history, surgical history, family history, social history, and previous encounter notes.  Weight Summary and Biometrics   Weight Lost Since Last Visit: 0  Weight Gained Since Last Visit: 1 lb  Vitals Temp: 97.9 F (36.6 C) BP: (!) 166/108 Pulse Rate: 72 SpO2: 98 %   Anthropometric Measurements Height: 5\' 5"  (1.651 m) Weight: 203 lb (92.1 kg) BMI (Calculated): 33.78 Weight at Last Visit: 202 lb Weight Lost Since Last Visit: 0 Weight Gained Since Last Visit: 1 lb Starting Weight: 228 lb Total Weight Loss (lbs): 25 lb (11.3 kg) Peak Weight: 228 lb   Body Composition  Body Fat %: 40.1 % Fat Mass (lbs): 81.8 lbs Muscle Mass (lbs): 115.8 lbs Total Body Water (lbs): 75.4 lbs Visceral Fat Rating : 11   Other Clinical Data RMR: 1858 Fasting: yes Labs: yes Today's Visit #: 74 Starting Date: 02/15/20    Objective:    PHYSICAL EXAM: Blood pressure (!) 166/108, pulse 72, temperature 97.9 F (36.6 C), height 5\' 5"  (1.651 m), weight 203 lb (92.1 kg), SpO2 98%. Body mass index is 33.78 kg/m.  General: she is overweight, cooperative and in no acute distress. PSYCH: Has normal mood, affect and thought process.   HEENT: EOMI, sclerae are anicteric. Lungs: Normal breathing effort, no conversational dyspnea. Extremities: Moves * 4 Neurologic: A and O * 3, good insight  DIAGNOSTIC DATA REVIEWED: BMET    Component Value Date/Time   NA 141 06/21/2022 1616   NA 138 04/22/2016 0750   K 4.2 06/21/2022 1616   K 3.9 04/22/2016 0750   CL 102 06/21/2022 1616   CO2 18 (L) 06/21/2022 1616   CO2 26 04/22/2016 0750   GLUCOSE 102 (H) 06/21/2022 1616   GLUCOSE 81 03/28/2022 1508   GLUCOSE 142 (H) 04/22/2016 0750   BUN 20 06/21/2022 1616   BUN 19.1 11/20/2016 1355   CREATININE 1.22 (H) 06/21/2022 1616  CREATININE 1.26 (H) 12/29/2019 1015   CREATININE 1.1 11/20/2016 1355   CALCIUM 12.2 (H) 06/21/2022 1616   CALCIUM 9.8 04/22/2016 0750   GFRNONAA 47 (L) 03/28/2022 1508   GFRNONAA 48 (L) 12/29/2019 1015   GFRNONAA 63 07/08/2014 1212   GFRAA 69 04/17/2020 0928   GFRAA 56 (L) 12/29/2019 1015   GFRAA 73 07/08/2014 1212   Lab Results  Component Value Date   HGBA1C 6.4 04/23/2023   HGBA1C 10.1 (H) 07/07/2015   No results found for: "INSULIN" Lab Results  Component Value Date   TSH 1.370 06/21/2022   CBC    Component Value Date/Time   WBC 5.4 03/28/2022 1508   RBC 4.97 03/28/2022 1508   HGB 15.0 03/28/2022 1508   HGB 13.3 02/03/2020 0837   HGB 13.6 12/04/2018 0914   HGB 12.9 04/22/2016 0750   HCT 46.8 (H) 03/28/2022 1508   HCT 41.0 12/04/2018 0914   HCT 39.0 04/22/2016 0750   PLT 318 03/28/2022 1508   PLT 270 02/03/2020 0837   PLT 332 12/04/2018 0914   MCV 94.2 03/28/2022 1508   MCV 88 12/04/2018 0914   MCV 92.2 04/22/2016 0750   MCH 30.2 03/28/2022 1508   MCHC 32.1 03/28/2022 1508   RDW  13.6 03/28/2022 1508   RDW 13.6 12/04/2018 0914   RDW 13.9 04/22/2016 0750   Iron Studies    Component Value Date/Time   IRON 43 08/18/2010 0515   TIBC 389 08/18/2010 0515   FERRITIN 227 08/18/2010 0515   IRONPCTSAT 11 (L) 08/18/2010 0515   Lipid Panel     Component Value Date/Time   CHOL 161 01/20/2023 1032   TRIG 183 (H) 01/20/2023 1032   HDL 46 01/20/2023 1032   CHOLHDL 3.5 01/20/2023 1032   CHOLHDL 5.1 (H) 08/14/2016 1729   VLDL 74 (H) 08/14/2016 1729   LDLCALC 84 01/20/2023 1032   Hepatic Function Panel     Component Value Date/Time   PROT 7.7 10/21/2022 1216   PROT 8.2 04/22/2016 0750   ALBUMIN 4.6 01/09/2022 0855   ALBUMIN 3.8 04/22/2016 0750   AST 26 01/09/2022 0855   AST 29 12/29/2019 1015   AST 27 04/22/2016 0750   ALT 29 01/09/2022 0855   ALT 33 12/29/2019 1015   ALT 24 04/22/2016 0750   ALKPHOS 74 01/09/2022 0855   ALKPHOS 74 04/22/2016 0750   BILITOT 0.5 01/09/2022 0855   BILITOT 0.6 12/29/2019 1015   BILITOT 0.44 04/22/2016 0750   BILIDIR 0.2 08/18/2010 0515   IBILI 1.5 (H) 08/18/2010 0515      Component Value Date/Time   TSH 1.370 06/21/2022 1616   Nutritional Lab Results  Component Value Date   VD25OH 33.96 04/23/2023   VD25OH 29.26 (L) 10/21/2022   VD25OH 31.2 06/21/2022    Attestations:   I, Isabelle Course, acting as a medical scribe for Thomasene Lot, DO., have compiled all relevant documentation for today's office visit on behalf of Thomasene Lot, DO, while in the presence of [employer], DO.  Reviewed by clinician on day of visit: allergies, medications, problem list, medical history, surgical history, family history, social history, and previous encounter notes pertinent to patient's obesity diagnosis.  I have reviewed the above documentation for accuracy and completeness, and I agree with the above. Sarah [name], D.O.  The 21st Century Cures Act was signed into law in 2016 which includes the topic of electronic health  records.  This provides immediate access to information in MyChart.  This includes  consultation notes, operative notes, office notes, lab results and pathology reports.  If you have any questions about what you read please let us know at your next visit so we can discuss your concerns and take corrective action if need be.  We are right here with you.

## 2023-05-22 ENCOUNTER — Ambulatory Visit: Payer: Medicare PPO | Attending: Internal Medicine | Admitting: Internal Medicine

## 2023-05-22 ENCOUNTER — Encounter: Payer: Self-pay | Admitting: Internal Medicine

## 2023-05-22 ENCOUNTER — Other Ambulatory Visit: Payer: Self-pay | Admitting: Internal Medicine

## 2023-05-22 VITALS — BP 142/96 | HR 80 | Temp 97.6°F | Ht 65.0 in | Wt 207.0 lb

## 2023-05-22 DIAGNOSIS — E1159 Type 2 diabetes mellitus with other circulatory complications: Secondary | ICD-10-CM

## 2023-05-22 DIAGNOSIS — E669 Obesity, unspecified: Secondary | ICD-10-CM | POA: Diagnosis not present

## 2023-05-22 DIAGNOSIS — M546 Pain in thoracic spine: Secondary | ICD-10-CM

## 2023-05-22 DIAGNOSIS — I152 Hypertension secondary to endocrine disorders: Secondary | ICD-10-CM

## 2023-05-22 DIAGNOSIS — E1169 Type 2 diabetes mellitus with other specified complication: Secondary | ICD-10-CM

## 2023-05-22 DIAGNOSIS — E785 Hyperlipidemia, unspecified: Secondary | ICD-10-CM | POA: Diagnosis not present

## 2023-05-22 DIAGNOSIS — M79631 Pain in right forearm: Secondary | ICD-10-CM

## 2023-05-22 DIAGNOSIS — Z7984 Long term (current) use of oral hypoglycemic drugs: Secondary | ICD-10-CM

## 2023-05-22 DIAGNOSIS — E119 Type 2 diabetes mellitus without complications: Secondary | ICD-10-CM

## 2023-05-22 DIAGNOSIS — M545 Low back pain, unspecified: Secondary | ICD-10-CM | POA: Diagnosis not present

## 2023-05-22 DIAGNOSIS — Z7985 Long-term (current) use of injectable non-insulin antidiabetic drugs: Secondary | ICD-10-CM

## 2023-05-22 DIAGNOSIS — G8929 Other chronic pain: Secondary | ICD-10-CM

## 2023-05-22 MED ORDER — TRAMADOL HCL 50 MG PO TABS: 50.0000 mg | ORAL_TABLET | Freq: Two times a day (BID) | ORAL | 1 refills | Status: DC | PRN

## 2023-05-22 MED ORDER — METHOCARBAMOL 500 MG PO TABS: 500.0000 mg | ORAL_TABLET | Freq: Two times a day (BID) | ORAL | 3 refills | Status: DC | PRN

## 2023-05-22 NOTE — Progress Notes (Signed)
Patient ID: Sarah [name], female    DOB: [birth year]  MRN: [id number]  CC: Diabetes (DM f/u. Med refill. Denton Meek about soreness/ tenderness on R arm X1 mo/Yes to flu vax)   Subjective: Sarah [name] is a 57 y.o. female who presents for chronic ds management. 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 (S/p adjuvant chemotherapy completed on 12/08/14. S/p ex lap, hernia repair and small bowel resection for a presumed recurrence (benign pathology), Hypercalcemia with elevated free light chains (BM bx Dr. Mosetta Putt negative, no need for further f/u), dx with [facility] by Dr. Everardo All, 1.1 cm RLL nodule (bx negative 01/2020), RT thyroid nodule (does not met criteria for bx 10/2019),     Discussed the use of AI scribe software for clinical note transcription with the patient, who gave verbal consent to proceed.  History of Present Illness   DM/obesity:  Lab Results  Component Value Date   HGBA1C 6.4 04/23/2023  She reports a significant weight loss of 26 pounds since joining a weight management program. She is currently on Mounjaro 15  mg and Metformin 500 mg daily for diabetes, which has helped decrease her appetite. Her blood sugar levels have been consistently under 110. She goes to the gym 5 days a week 3 of which she participates in the [employer] program.  She request refill on tramadol and a muscle relaxant Robaxin which she takes as needed for chronic lower thoracic back pain.  She takes the tramadol sparingly.  No significant side effects from the medication.  She is still up-to-date with pain management agreement contract.  RT arm Pain:  The patient also reports right forearm pain, which started suddenly a month ago. The pain is localized in the muscle area on the ulnar side and is exacerbated when pressure is applied or  weight is put on the hand. There is no reported injury, redness, or swelling in the area.  Hypercalcemia/thyroid nodules right side: The patient has been seeing an endocrinologist for increased calcium levels and thyroid nodules. The endocrinologist believes the increased calcium is not due to an inherited disorder. There is also a nodule in the right upper lobe of the patient's lungs, suspected to be a carcinoid tumor.  There has been talks about having this nodule removed sometime next year.  I reminded patient that this nodule was biopsied a few years ago and found to be benign.       HTN/HL: Reports compliance with taking amlodipine 10 mg daily, hydralazine 25 mg 1-1/2 tablets 3 times a day, Cozaar 50 mg daily, metoprolol 25 mg half tablet twice a day and Crestor.  -Blood pressure today is elevated.  However I see several blood pressure readings done last month that were within normal range.  Reports blood readings at home have been normal.  She has not taken any of her blood pressure medicines as yet for the morning.  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 02/22/2023   Chronic cough 02/05/2023   Class 1 obesity with serious comorbidity and body mass index (BMI) of 32.0 to 32.9 in adult 08/28/2022   Gastroesophageal reflux disease 08/28/2022   with current bmi 32.5 08/28/2022   Opioid use agreement exists 07/21/2022  Degenerative disc disease, thoracic 07/21/2022   Degenerative disc disease, lumbar 07/21/2022   Essential hypertension 07/10/2022   BMI 32.0-32.9,adult-current bmi 32.9 07/10/2022   Multiple thyroid nodules 06/21/2022   Type 2 diabetes mellitus with obesity (HCC) 05/28/2022   Environmental allergies 05/28/2022   Class 2 severe obesity with serious comorbidity and body mass index (BMI) of 37.0 to 37.9 in adult (HCC) 05/28/2022   H/O gastroesophageal reflux  (GERD) 05/28/2022   Hypomagnesemia 02/04/2022   SOB (shortness of breath) on exertion 02/04/2022   Mixed stress and urge urinary incontinence 10/04/2021   S/P hernia repair 07/03/2021   Atherosclerosis of aorta (HCC) 05/28/2021   Mood disorder (HCC) with emotional eating 05/21/2021   Cutaneous abscess of back excluding buttocks 01/12/2021   Depressed mood, with emotional eating 10/23/2020   Seasonal allergies 09/05/2020   Class 2 severe obesity with serious comorbidity and body mass index (BMI) of 36.0 to 36.9 in adult (HCC) 07/24/2020   At risk for impaired metabolic function 06/12/2020   Hypertension associated with type 2 diabetes mellitus (HCC) 05/15/2020   Stage 3 chronic kidney disease (HCC) 05/01/2020   Type 2 diabetes mellitus with chronic kidney disease, with long-term current use of insulin (HCC) 04/17/2020   At risk for hypoglycemia 04/17/2020   B12 deficiency 02/29/2020   Diabetes mellitus (HCC) 02/15/2020   Vitamin D deficiency 02/15/2020   Other insomnia 01/24/2020   Morbid obesity (HCC) 01/24/2020   Elevated serum immunoglobulin free light chains 01/21/2020   Thyroid nodule 10/14/2019   Renal insufficiency 06/24/2019   Hypercalcemia 06/24/2019   Hiatal hernia with GERD without esophagitis 06/18/2017   Need for influenza vaccination 02/17/2017   Ventral hernia 01/24/2016   Type 2 diabetes mellitus without complication, without long-term current use of insulin (HCC) 10/04/2015   Generalized obesity 10/04/2015   Hyperlipidemia associated with type 2 diabetes mellitus (HCC) 07/27/2015   Neuropathic pain of both legs 07/27/2015   Obesity (BMI 30.0-34.9) 12/24/2014   Genetic testing 11/01/2014   Functional constipation 09/24/2014   Chemotherapy-induced peripheral neuropathy (HCC) 09/24/2014   Midline low back pain without sciatica 09/24/2014   Family history of colon cancer    Hilar adenopathy    Hypertension associated with diabetes (HCC)    Pulmonary nodule 1 cm or  greater in diameter      Current Outpatient Medications on File Prior to Visit  Medication Sig Dispense Refill   albuterol (VENTOLIN HFA) 108 (90 Base) MCG/ACT inhaler Inhale 2 puffs into the lungs every 6 (six) hours as needed for wheezing or shortness of breath. 8 g 0   amLODipine (NORVASC) 10 MG tablet Take 1 tablet (10 mg total) by mouth daily. 90 tablet 3   Blood Glucose Monitoring Suppl (ACCU-CHEK GUIDE) w/Device KIT Use to check blood sugar 3 times daily. 1 kit 0   buPROPion (WELLBUTRIN SR) 200 MG 12 hr tablet Take 1 tablet (200 mg total) by mouth 2 (two) times daily. 60 tablet 0   Cholecalciferol (VITAMIN D3) 25 MCG (1000 UT) CAPS Take 1 capsule (1,000 Units total) by mouth daily. Per Endo in 10/2022 60 capsule    Continuous Glucose Receiver (FREESTYLE LIBRE 3 READER) DEVI Use to check glucose continuously. Dx E11.69 1 each 0   Continuous Glucose Sensor (FREESTYLE LIBRE 3 SENSOR) MISC Place 1 sensor on the skin every 14 days. Use to check glucose continuously. Dx E11.69 2 each 6   dorzolamide-timolol (COSOPT) 2-0.5 % ophthalmic solution Place 1 drop into both eyes 2 (two) times daily.  10 mL 3   fluticasone (FLONASE) 50 MCG/ACT nasal spray Place 2 sprays into both nostrils daily. 16 g 3   gabapentin (NEURONTIN) 600 MG tablet Take 1 tablet (600 mg total) by mouth 3 (three) times daily. 270 tablet 2   glucose blood (ACCU-CHEK GUIDE) test strip Use to check blood sugar 3 times daily. 100 each 6   hydrALAZINE (APRESOLINE) 25 MG tablet TAKE 1 AND 1/2 TABLETS THREE TIMES DAILY 405 tablet 3   Insulin Pen Needle (DROPLET PEN NEEDLES) 29G X MISC USE AS DIRECTED 100 each 0   Lancets Misc. (ACCU-CHEK SOFTCLIX LANCET DEV) KIT Check blood sugars 3 times a day. 3 kit 6   latanoprost (XALATAN) 0.005 % ophthalmic solution Place 1 drop into both eyes every evening at bedtime as directed 2.5 mL 6   levocetirizine (XYZAL) 5 MG tablet Take 1 tablet (5 mg total) by mouth every evening. 90 tablet 0    lidocaine (LIDODERM) 5 % APPLY 1 PATCH ONTO THE SKIN DAILY. REMOVE AND DISCARD PATCH WITHIN 12 HOURS OR AS DIRECTED BY MD 90 patch 0   losartan (COZAAR) 50 MG tablet TAKE 1 TABLET EVERY DAY 90 tablet 0   metFORMIN (GLUCOPHAGE) 500 MG tablet Take 1 tablet (500 mg total) by mouth daily with breakfast. 90 tablet 3   metoprolol tartrate (LOPRESSOR) 25 MG tablet TAKE 1/2 TABLET TWICE DAILY 90 tablet 3   montelukast (SINGULAIR) 10 MG tablet Take 1 tablet (10 mg total) by mouth at bedtime. 90 tablet 3   omeprazole (PRILOSEC) 20 MG capsule Take 1 capsule (20 mg total) by mouth daily. 90 capsule 0   ondansetron (ZOFRAN) 4 MG tablet Take 1 tablet by mouth every 6 hours as needed severe nausea 30 tablet 0   rosuvastatin (CRESTOR) 20 MG tablet Take 1 tablet (20 mg total) by mouth daily. 90 tablet 3   solifenacin (VESICARE) 5 MG tablet Take 1 tablet (5 mg total) by mouth daily. 90 tablet 3   tirzepatide (MOUNJARO) 15 MG/0.5ML Pen Inject 15 mg into the skin once a week every Thursday 2 mL 0   triamcinolone cream (KENALOG) 0.1 % Apply 1 Application topically 2 (two) times daily. 30 g 0   vitamin B-12 (CYANOCOBALAMIN) 500 MCG tablet Take 1 tablet (500 mcg total) by mouth daily.     No current facility-administered medications on file prior to visit.    Allergies  Allergen Reactions   Emend [Aprepitant] Other (See Comments)    Urticaria    Lisinopril Cough    Social History   Socioeconomic History   Marital status: Married    Spouse name: Not on file   Number of children: 2   Years of education: Not on file   Highest education level: 12th grade  Occupational History   Occupation: disability  Tobacco Use   Smoking status: Never   Smokeless tobacco: Never  Vaping Use   Vaping status: Never Used  Substance and Sexual Activity   Alcohol use: No   Drug use: No   Sexual activity: Not Currently    Birth control/protection: None  Other Topics Concern   Not on file  Social History Narrative   No  issues with transportation.    Attends church   Social Drivers of Health   Financial Resource Strain: Medium Risk (05/19/2023)   Overall Financial Resource Strain (CARDIA)    Difficulty of Paying Living Expenses: Somewhat hard  Food Insecurity: Patient Declined (05/19/2023)   Hunger Vital Sign  Worried About Programme researcher, broadcasting/film/video in the Last Year: Patient declined    Barista in the Last Year: Patient declined  Transportation Needs: No Transportation Needs (05/19/2023)   PRAPARE - Administrator, Civil Service (Medical): No    Lack of Transportation (Non-Medical): No  Physical Activity: Sufficiently Active (05/19/2023)   Exercise Vital Sign    Days of Exercise per Week: 5 days    Minutes of Exercise per Session: 50 min  Stress: No Stress Concern Present (05/19/2023)   [employer] of Occupational Health - Occupational Stress Questionnaire    Feeling of Stress : Only a little  Social Connections: Moderately Integrated (05/19/2023)   Social Connection and Isolation Panel [NHANES]    Frequency of Communication with Friends and Family: More than three times a week    Frequency of Social Gatherings with Friends and Family: More than three times a week    Attends Religious Services: More than 4 times per year    Active Member of Clubs or Organizations: Yes    Attends Banker Meetings: More than 4 times per year    Marital Status: Separated  Intimate Partner Violence: Not At Risk (06/28/2022)   Humiliation, Afraid, Rape, and Kick questionnaire    Fear of Current or Ex-Partner: No    Emotionally Abused: No    Physically Abused: No    Sexually Abused: No    Family History  Problem Relation Age of Onset   Hypertension Mother    Hyperlipidemia Mother    Stroke Mother    Thyroid disease Mother    Arthritis Mother    Hearing loss Mother    Hypertension Father    Diabetes Father    Hyperlipidemia Father    Sudden death Father    Arthritis Father     Heart disease Father    Obesity Father    Cancer Sister 43       fibrosarcoma (back); currently 103   Cancer Brother    Prostate cancer Maternal Uncle    Colon cancer Paternal Aunt        Dx 30s; deceased 60s   Prostate cancer Paternal Uncle        currently 30   Cancer Paternal Uncle 42       "bone" ; unk. primary   Stomach cancer Paternal Uncle    Hypercalcemia Neg Hx     Past Surgical History:  Procedure Laterality Date   ABDOMINAL HYSTERECTOMY     CATARACT EXTRACTION     RT eye 11/28/22 and LT eye 12/19/2022   FOOT SURGERY  04/2018   [facility]   HERNIA REPAIR     INCISIONAL HERNIA REPAIR N/A 07/06/2015   Procedure: INCISIONAL HERNIA REPAIR ;  Surgeon: Emelia Loron, MD;  Location: [facility];  Service: General;  Laterality: N/A;   INCISIONAL HERNIA REPAIR N/A 07/03/2021   Procedure: Sherald Hess HERNIA REPAIR WITH MESH;  Surgeon: Axel Filler, MD;  Location: [facility];  Service: General;  Laterality: N/A;   INSERTION OF MESH N/A 07/06/2015   Procedure: WITH INSERTION OF PHASIX ST MESH;  Surgeon: Emelia Loron, MD;  Location: [facility];  Service: General;  Laterality: N/A;   LAPAROTOMY N/A 07/06/2015   Procedure: EXPLORATORY LAPAROTOMY;  Surgeon: Adolphus Birchwood, MD;  Location: [facility];  Service: Gynecology;  Laterality: N/A;   LYSIS OF ADHESION N/A 07/06/2015   Procedure:  LYSIS OF ADHESION RESECTION OF MESENTERIC MASS BOWEL RESECTION ;  Surgeon: Adolphus Birchwood,  MD;  Location: [facility];  Service: Gynecology;  Laterality: N/A;   LYSIS OF ADHESION N/A 07/03/2021   Procedure: EXPLORATORY LAPAROTOMY WITH LYSIS OF ADHESION;  Surgeon: Axel Filler, MD;  Location: [facility] OR;  Service: General;  Laterality: N/A;   ROBOTIC ASSISTED TOTAL HYSTERECTOMY WITH BILATERAL SALPINGO OOPHERECTOMY Bilateral 07/28/2014   Procedure: ROBOTIC ASSISTED lysis of adhesions with biopsies, converted to LAPAROTOMY, bilateral salpingoorphorectomy, omentectomy,appendectomy;  Surgeon: Laurette Schimke, MD;  Location: [facility];  Service: Gynecology;  Laterality: Bilateral;   THYROIDECTOMY, PARTIAL     VIDEO BRONCHOSCOPY N/A 01/13/2020   Procedure: VIDEO BRONCHOSCOPY WITH BIOPSIES;  Surgeon: Loreli Slot, MD;  Location: [facility] OR;  Service: Thoracic;  Laterality: N/A;    ROS: Review of Systems Negative except as stated above  PHYSICAL EXAM: BP (!) 142/96   Pulse 80   Temp 97.6 F (36.4 C) (Oral)   Ht 5\' 5"  (1.651 m)   Wt 207 lb (93.9 kg)   LMP  (LMP Unknown)   SpO2 96%   BMI 34.45 kg/m   Wt Readings from Last 3 Encounters:  05/22/23 207 lb (93.9 kg)  05/21/23 203 lb (92.1 kg)  05/07/23 202 lb (91.6 kg)    Physical Exam  General appearance - alert, well appearing, older AAF and in no distress Mental status - normal mood, behavior, speech, dress, motor activity, and thought processes Chest - clear to auscultation, no wheezes, rales or rhonchi, symmetric air entry Heart - normal rate, regular rhythm, normal S1, S2, no murmurs, rubs, clicks or gallops Musculoskeletal -right forearm: No edema or erythema.  Mild tenderness along the soft tissue on the ulnar side of the forearm. Extremities - peripheral pulses normal, no pedal edema, no clubbing or cyanosis      Latest Ref Rng & Units 10/21/2022   12:16 PM 06/21/2022    4:16 PM 03/28/2022    3:08 PM  CMP  Glucose 70 - 99 mg/dL  409  81   BUN 6 - 24 mg/dL  20  19   Creatinine 8.11 - 1.00 mg/dL  9.14  7.82   Sodium 956 - 144 mmol/L  141  139   Potassium 3.5 - 5.2 mmol/L  4.2  3.7   Chloride 96 - 106 mmol/L  102  106   CO2 20 - 29 mmol/L  18  26   Calcium 8.7 - 10.2 mg/dL  21.3  08.6   Total Protein 6.0 - 8.5 g/dL 7.7      Lipid Panel     Component Value Date/Time   CHOL 161 01/20/2023 1032   TRIG 183 (H) 01/20/2023 1032   HDL 46 01/20/2023 1032   CHOLHDL 3.5 01/20/2023 1032   CHOLHDL 5.1 (H) 08/14/2016 1729   VLDL 74 (H) 08/14/2016 1729   LDLCALC 84 01/20/2023 1032    CBC    Component Value Date/Time   WBC 5.4 03/28/2022  1508   RBC 4.97 03/28/2022 1508   HGB 15.0 03/28/2022 1508   HGB 13.3 02/03/2020 0837   HGB 13.6 12/04/2018 0914   HGB 12.9 04/22/2016 0750   HCT 46.8 (H) 03/28/2022 1508   HCT 41.0 12/04/2018 0914   HCT 39.0 04/22/2016 0750   PLT 318 03/28/2022 1508   PLT 270 02/03/2020 0837   PLT 332 12/04/2018 0914   MCV 94.2 03/28/2022 1508   MCV 88 12/04/2018 0914   MCV 92.2 04/22/2016 0750   MCH 30.2 03/28/2022 1508   MCHC 32.1 03/28/2022 1508   RDW 13.6  03/28/2022 1508   RDW 13.6 12/04/2018 0914   RDW 13.9 04/22/2016 0750   LYMPHSABS 2.4 03/28/2022 1508   LYMPHSABS 2.7 04/22/2016 0750   MONOABS 0.8 03/28/2022 1508   MONOABS 1.0 (H) 04/22/2016 0750   EOSABS 0.1 03/28/2022 1508   EOSABS 0.1 04/22/2016 0750   BASOSABS 0.0 03/28/2022 1508   BASOSABS 0.0 04/22/2016 0750    ASSESSMENT AND PLAN: 1. Type 2 diabetes mellitus with obesity (HCC) (Primary) A1c at goal. Commended her on the weight loss that she has achieved so far.  Encouraged her to continue healthy eating habits.  She will continue Mounjaro 15 mg once a week and metformin - Basic Metabolic Panel - Microalbumin / creatinine urine ratio  2. Diabetes mellitus treated with oral medication (HCC) See #1 above  3. Long-term (current) use of injectable non-insulin antidiabetic drugs See #1 above  4. Hypertension associated with diabetes (HCC) Not at goal.  However she has not taken her medicines as yet for today.  Recent blood pressure readings at specialist visits were good. Continue  amlodipine 10 mg daily, hydralazine 25 mg 1-1/2 tablets 3 times a day, Cozaar 50 mg daily, metoprolol 25 mg half tablet twice a day   5. Hyperlipidemia associated with type 2 diabetes mellitus (HCC) Continue rosuvastatin 20 mg daily  6. Pain of right forearm Of questionable etiology.  I recommend warm compresses 2-3 times a day for 10 minutes.  If after 1 week this still persists, she should give me a call so that we can refer to Ortho.  7.  Chronic bilateral low back pain without sciatica Refill given on tramadol and Robaxin.  Patient uses tramadol sparingly with good results. Up-to-date with controlled substance prescribing agreement. [place] controlled substance reporting system reviewed. - traMADol (ULTRAM) 50 MG tablet; Take 1 tablet (50 mg total) by mouth every 12 (twelve) hours as needed.  Dispense: 60 tablet; Refill: 1 - methocarbamol (ROBAXIN) 500 MG tablet; Take 1 tablet (500 mg total) by mouth 2 (two) times daily as needed for muscle spasms.  Dispense: 40 tablet; Refill: 3  8. Chronic bilateral thoracic back pain - traMADol (ULTRAM) 50 MG tablet; Take 1 tablet (50 mg total) by mouth every 12 (twelve) hours as needed.  Dispense: 60 tablet; Refill: 1 - methocarbamol (ROBAXIN) 500 MG tablet; Take 1 tablet (500 mg total) by mouth 2 (two) times daily as needed for muscle spasms.  Dispense: 40 tablet; Refill: 3  9. Hypercalcemia Followed by endocrinology. Message sent to Dr.Gherghe letting her know that the right lower lobe nodule was biopsied 01/2020 with benign pathology.    Patient was given the opportunity to ask questions.  Patient verbalized understanding of the plan and was able to repeat key elements of the plan.   This documentation was completed using Paediatric nurse.  Any transcriptional errors are unintentional.  Orders Placed This Encounter  Procedures   Basic Metabolic Panel   Microalbumin / creatinine urine ratio     Requested Prescriptions   Signed Prescriptions Disp Refills   traMADol (ULTRAM) 50 MG tablet 60 tablet 1    Sig: Take 1 tablet (50 mg total) by mouth every 12 (twelve) hours as needed.   methocarbamol (ROBAXIN) 500 MG tablet 40 tablet 3    Sig: Take 1 tablet (500 mg total) by mouth 2 (two) times daily as needed for muscle spasms.    Return in about 4 months (around 09/20/2023) for Please sign release to get last eye exam from ophthalmologist.  Needs MWV CMA  1/19-23/25.  Jonah Blue, MD, FACP

## 2023-05-23 LAB — MICROALBUMIN / CREATININE URINE RATIO
Creatinine, Urine: 85.1 mg/dL
Microalb/Creat Ratio: 13 mg/g{creat} (ref 0–29)
Microalbumin, Urine: 11 ug/mL

## 2023-05-23 LAB — BASIC METABOLIC PANEL
BUN/Creatinine Ratio: 14 (ref 9–23)
BUN: 18 mg/dL (ref 6–24)
CO2: 21 mmol/L (ref 20–29)
Calcium: 11.7 mg/dL — ABNORMAL HIGH (ref 8.7–10.2)
Chloride: 103 mmol/L (ref 96–106)
Creatinine, Ser: 1.26 mg/dL — ABNORMAL HIGH (ref 0.57–1.00)
Glucose: 92 mg/dL (ref 70–99)
Potassium: 4.7 mmol/L (ref 3.5–5.2)
Sodium: 141 mmol/L (ref 134–144)
eGFR: 50 mL/min/{1.73_m2} — ABNORMAL LOW (ref >59)

## 2023-05-28 ENCOUNTER — Other Ambulatory Visit: Payer: Self-pay | Admitting: Internal Medicine

## 2023-05-30 ENCOUNTER — Other Ambulatory Visit: Payer: Self-pay

## 2023-06-09 ENCOUNTER — Ambulatory Visit (INDEPENDENT_AMBULATORY_CARE_PROVIDER_SITE_OTHER): Payer: Medicare PPO | Admitting: Internal Medicine

## 2023-06-09 ENCOUNTER — Other Ambulatory Visit (HOSPITAL_COMMUNITY): Payer: Self-pay

## 2023-06-09 ENCOUNTER — Encounter (INDEPENDENT_AMBULATORY_CARE_PROVIDER_SITE_OTHER): Payer: Self-pay | Admitting: Internal Medicine

## 2023-06-09 VITALS — BP 128/83 | HR 70 | Temp 97.7°F | Ht 65.0 in | Wt 206.0 lb

## 2023-06-09 DIAGNOSIS — E1122 Type 2 diabetes mellitus with diabetic chronic kidney disease: Secondary | ICD-10-CM | POA: Diagnosis not present

## 2023-06-09 DIAGNOSIS — I152 Hypertension secondary to endocrine disorders: Secondary | ICD-10-CM | POA: Diagnosis not present

## 2023-06-09 DIAGNOSIS — N1831 Chronic kidney disease, stage 3a: Secondary | ICD-10-CM | POA: Diagnosis not present

## 2023-06-09 DIAGNOSIS — E1159 Type 2 diabetes mellitus with other circulatory complications: Secondary | ICD-10-CM | POA: Diagnosis not present

## 2023-06-09 DIAGNOSIS — E1169 Type 2 diabetes mellitus with other specified complication: Secondary | ICD-10-CM | POA: Diagnosis not present

## 2023-06-09 DIAGNOSIS — Z7985 Long-term (current) use of injectable non-insulin antidiabetic drugs: Secondary | ICD-10-CM | POA: Diagnosis not present

## 2023-06-09 DIAGNOSIS — E66812 Obesity, class 2: Secondary | ICD-10-CM

## 2023-06-09 DIAGNOSIS — Z6836 Body mass index (BMI) 36.0-36.9, adult: Secondary | ICD-10-CM

## 2023-06-09 MED ORDER — TIRZEPATIDE 15 MG/0.5ML ~~LOC~~ SOAJ
15.0000 mg | SUBCUTANEOUS | 1 refills | Status: DC
  Filled 2023-06-09: qty 2, 28d supply, fill #0

## 2023-06-09 NOTE — Progress Notes (Signed)
Office: [phone]  /  Fax: [phone]  Weight Summary And Biometrics  Vitals Temp: 97.7 F (36.5 C) BP: 128/83 Pulse Rate: 70 SpO2: 97 %   Anthropometric Measurements Height: 5\' 5"  (1.651 m) Weight: 206 lb (93.4 kg) BMI (Calculated): 34.28 Weight at Last Visit: 203lb Weight Lost Since Last Visit: 0lb Weight Gained Since Last Visit: 3lb Starting Weight: 228lb Total Weight Loss (lbs): 22 lb (9.979 kg) Peak Weight: 228lb   Body Composition  Body Fat %: 39.2 % Fat Mass (lbs): 80.8 lbs Muscle Mass (lbs): 118.8 lbs Total Body Water (lbs): 73.8 lbs Visceral Fat Rating : 11    No data recorded Today's Visit #: 75  Starting Date: 02/15/20   Subjective   Chief Complaint: Obesity  Braelin is here to discuss her progress with her obesity treatment plan. She is on the the Category 3 Plan and states she is following her eating plan approximately 60 % of the time. She states she is exercising  Silver Sneakers 50 minutes 3 times per week and walking 60 4 days a week.   Interval History:   Discussed the use of AI scribe software for clinical note transcription with the patient, who gave verbal consent to proceed.  History of Present Illness   Sarah [name], a patient with obesity and type two diabetes, presents for medical weight management. She has been on tirzepatide 15mg  once a week. Her weight has fluctuated from 234 pounds in August 2021 to as low as 192 pounds in September 2023. However, she has experienced a plateau and subsequent weight regain since summer 2024.  She reports adherence to a physical activity regimen of 60 minutes at the [facility] twice a week. However, she admits to not following her dietary plan, often consuming only two meals a day due to a lack of appetite. She frequently skips breakfast and lunch, but consistently eats dinner. She expresses difficulty in consuming adequate amounts of food, particularly protein.  In addition to obesity and diabetes,  she has a history of stage three kidney disease and neuropathy due to chemotherapy for ovarian cancer. She also has an elevated calcium level, the cause of which is currently being investigated. She is on amlodipine and metoprolol, both of which could potentially contribute to weight gain. She is also on metformin for diabetes control.      Stress levels are reported as low and manageable.   Orexigenic Control:  Denies problems with appetite and hunger signals.  Denies problems with satiety and satiation.  Denies problems with eating patterns and portion control.  Denies abnormal cravings. Denies feeling deprived or restricted.   Barriers identified:  Chronic skipping of meals .   Pharmacotherapy for weight loss: She is currently taking Monjauro with diabetes as the primary indication with adequate clinical response  and without side effects..   Assessment and Plan   Treatment Plan For Obesity:  Recommended Dietary Goals  [name] is currently in the action stage of change. As such, her goal is to continue weight management plan. She has agreed to: continue current plan  Behavioral Intervention  We discussed the following Behavioral Modification Strategies today: continue to work on maintaining a reduced calorie state, getting the recommended amount of protein, incorporating whole foods, making healthy choices, staying well hydrated and practicing mindfulness when eating..  Additional resources provided today: None  Recommended Physical Activity Goals  Hillory has been advised to work up to 150 minutes of moderate intensity aerobic activity a week and strengthening exercises 2-3  times per week for cardiovascular health, weight loss maintenance and preservation of muscle mass.   She has agreed to :  Think about enjoyable ways to increase daily physical activity and overcoming barriers to exercise and Increase physical activity in their day and reduce sedentary time (increase  NEAT).  Pharmacotherapy  We discussed various medication options to help Imagene with her weight loss efforts and we both agreed to : continue current anti-obesity medication regimen  Associated Conditions Addressed and Impacted by Obesity Treatment  Stage 3a chronic kidney disease (CKD) (HCC)  Type 2 diabetes mellitus with obesity (HCC) -     Tirzepatide; Inject 15 mg into the skin once a week every Thursday  Dispense: 2 mL; Refill: 1  Hypertension associated with diabetes (HCC)  Class 2 severe obesity with serious comorbidity and body mass index (BMI) of 36.0 to 36.9 in adult, unspecified obesity type (HCC)    Assessment and Plan    Obesity Weight management with a history of weight fluctuations. Current weight regain likely due to inadequate nutrition and decreased meal frequency. On tirzepatide 15 mg weekly. Discussed balanced meals, adequate protein intake, and hydration to prevent metabolic slowdown and weight regain. Emphasized dietary changes and increased physical activity to overcome weight plateau. - Refill tirzepatide [name]) prescription at [facility] - Increase meal frequency to three meals per day - Ensure 25 grams of protein per meal, not exceeding 75 grams per day due to chronic kidney disease - Increase physical activity - Drink 90 ounces of water per day  Type 2 Diabetes Mellitus Diabetes control is excellent with tirzepatide. Diet needs recalibration to maintain weight loss and diabetes control. Sensitive to carbohydrates, leading to weight gain and increased insulin requirements. Discussed high-protein, low-carbohydrate diet and its impact on insulin production and weight gain. - Maintain a high-protein, low-carbohydrate diet - Incorporate more whole foods, whole grains, fruits, and vegetables - Avoid processed foods and high-carbohydrate items like white bread, potatoes, cereals, cookies, and sugary drinks  Chronic Kidney Disease Stage 3a Stage 3  chronic kidney disease.  Most recent GFR in the high 40s. Recommended limiting protein intake to 75 grams per day.  Her blood pressure is under reasonable control.  Patient counseled on avoidance of nephrotoxins and maintaining adequate hydration.  She is currently on metoprolol, losartan and amlodipine. - Limit protein intake to 75 grams per day - Monitor kidney function regularly  Hypertension Blood pressure well-controlled at 128/83 mmHg with amlodipine and metoprolol and losartan. Metoprolol may contribute to weight gain. Advised discussing potential medication adjustments with primary physician. Continue current regimen    General Health Maintenance Discussed general health maintenance to support overall health and weight management. Emphasized physical activity, hydration, and a balanced diet. - Increase physical activity - Maintain hydration with 90 ounces of water per day - Follow a balanced diet with whole foods, whole grains, fruits, and vegetables  Follow-up - Follow up with Doctor Molli Knock for ongoing weight management and potential medication adjustments.        Objective   Physical Exam:  Blood pressure 128/83, pulse 70, temperature 97.7 F (36.5 C), height 5\' 5"  (1.651 m), weight 206 lb (93.4 kg), SpO2 97%. Body mass index is 34.28 kg/m.  General: She is overweight, cooperative, alert, well developed, and in no acute distress. PSYCH: Has normal mood, affect and thought process.   HEENT: EOMI, sclerae are anicteric. Lungs: Normal breathing effort, no conversational dyspnea. Extremities: No edema.  Neurologic: No gross sensory or motor deficits. No  tremors or fasciculations noted.    Diagnostic Data Reviewed:  BMET    Component Value Date/Time   NA 141 05/22/2023 1140   NA 138 04/22/2016 0750   K 4.7 05/22/2023 1140   K 3.9 04/22/2016 0750   CL 103 05/22/2023 1140   CO2 21 05/22/2023 1140   CO2 26 04/22/2016 0750   GLUCOSE 92 05/22/2023 1140   GLUCOSE 81  03/28/2022 1508   GLUCOSE 142 (H) 04/22/2016 0750   BUN 18 05/22/2023 1140   BUN 19.1 11/20/2016 1355   CREATININE 1.26 (H) 05/22/2023 1140   CREATININE 1.26 (H) 12/29/2019 1015   CREATININE 1.1 11/20/2016 1355   CALCIUM 11.7 (H) 05/22/2023 1140   CALCIUM 9.8 04/22/2016 0750   GFRNONAA 47 (L) 03/28/2022 1508   GFRNONAA 48 (L) 12/29/2019 1015   GFRNONAA 63 07/08/2014 1212   GFRAA 69 04/17/2020 0928   GFRAA 56 (L) 12/29/2019 1015   GFRAA 73 07/08/2014 1212   Lab Results  Component Value Date   HGBA1C 6.4 04/23/2023   HGBA1C 10.1 (H) 07/07/2015   No results found for: "INSULIN" Lab Results  Component Value Date   TSH 1.370 06/21/2022   CBC    Component Value Date/Time   WBC 5.4 03/28/2022 1508   RBC 4.97 03/28/2022 1508   HGB 15.0 03/28/2022 1508   HGB 13.3 02/03/2020 0837   HGB 13.6 12/04/2018 0914   HGB 12.9 04/22/2016 0750   HCT 46.8 (H) 03/28/2022 1508   HCT 41.0 12/04/2018 0914   HCT 39.0 04/22/2016 0750   PLT 318 03/28/2022 1508   PLT 270 02/03/2020 0837   PLT 332 12/04/2018 0914   MCV 94.2 03/28/2022 1508   MCV 88 12/04/2018 0914   MCV 92.2 04/22/2016 0750   MCH 30.2 03/28/2022 1508   MCHC 32.1 03/28/2022 1508   RDW 13.6 03/28/2022 1508   RDW 13.6 12/04/2018 0914   RDW 13.9 04/22/2016 0750   Iron Studies    Component Value Date/Time   IRON 43 08/18/2010 0515   TIBC 389 08/18/2010 0515   FERRITIN 227 08/18/2010 0515   IRONPCTSAT 11 (L) 08/18/2010 0515   Lipid Panel     Component Value Date/Time   CHOL 161 01/20/2023 1032   TRIG 183 (H) 01/20/2023 1032   HDL 46 01/20/2023 1032   CHOLHDL 3.5 01/20/2023 1032   CHOLHDL 5.1 (H) 08/14/2016 1729   VLDL 74 (H) 08/14/2016 1729   LDLCALC 84 01/20/2023 1032   Hepatic Function Panel     Component Value Date/Time   PROT 7.7 10/21/2022 1216   PROT 8.2 04/22/2016 0750   ALBUMIN 4.6 01/09/2022 0855   ALBUMIN 3.8 04/22/2016 0750   AST 26 01/09/2022 0855   AST 29 12/29/2019 1015   AST 27 04/22/2016 0750    ALT 29 01/09/2022 0855   ALT 33 12/29/2019 1015   ALT 24 04/22/2016 0750   ALKPHOS 74 01/09/2022 0855   ALKPHOS 74 04/22/2016 0750   BILITOT 0.5 01/09/2022 0855   BILITOT 0.6 12/29/2019 1015   BILITOT 0.44 04/22/2016 0750   BILIDIR 0.2 08/18/2010 0515   IBILI 1.5 (H) 08/18/2010 0515      Component Value Date/Time   TSH 1.370 06/21/2022 1616   Nutritional Lab Results  Component Value Date   VD25OH 33.96 04/23/2023   VD25OH 29.26 (L) 10/21/2022   VD25OH 31.2 06/21/2022    Follow-Up   Return in about 4 weeks (around 07/07/2023) for Dr.Opalski.Marland Kitchen She was informed of the importance of frequent  follow up visits to maximize her success with intensive lifestyle modifications for her multiple health conditions.  Attestation Statement   Reviewed by clinician on day of visit: allergies, medications, problem list, medical history, surgical history, family history, social history, and previous encounter notes.     Worthy Rancher, MD

## 2023-06-11 ENCOUNTER — Telehealth: Payer: Self-pay | Admitting: Internal Medicine

## 2023-06-12 NOTE — Telephone Encounter (Signed)
Abstraction completed  

## 2023-06-15 ENCOUNTER — Other Ambulatory Visit: Payer: Self-pay | Admitting: Internal Medicine

## 2023-06-16 ENCOUNTER — Ambulatory Visit: Payer: Medicare PPO

## 2023-06-23 ENCOUNTER — Other Ambulatory Visit: Payer: Self-pay | Admitting: Internal Medicine

## 2023-06-23 DIAGNOSIS — G8929 Other chronic pain: Secondary | ICD-10-CM

## 2023-06-23 DIAGNOSIS — M545 Low back pain, unspecified: Secondary | ICD-10-CM

## 2023-06-25 ENCOUNTER — Ambulatory Visit (INDEPENDENT_AMBULATORY_CARE_PROVIDER_SITE_OTHER): Payer: Medicare PPO | Admitting: Family Medicine

## 2023-06-25 ENCOUNTER — Other Ambulatory Visit (HOSPITAL_COMMUNITY): Payer: Self-pay

## 2023-06-25 ENCOUNTER — Encounter (INDEPENDENT_AMBULATORY_CARE_PROVIDER_SITE_OTHER): Payer: Self-pay | Admitting: Family Medicine

## 2023-06-25 VITALS — BP 109/73 | HR 75 | Temp 97.8°F | Ht 65.0 in | Wt 201.0 lb

## 2023-06-25 DIAGNOSIS — Z7984 Long term (current) use of oral hypoglycemic drugs: Secondary | ICD-10-CM | POA: Diagnosis not present

## 2023-06-25 DIAGNOSIS — K219 Gastro-esophageal reflux disease without esophagitis: Secondary | ICD-10-CM

## 2023-06-25 DIAGNOSIS — Z6833 Body mass index (BMI) 33.0-33.9, adult: Secondary | ICD-10-CM | POA: Diagnosis not present

## 2023-06-25 DIAGNOSIS — Z7985 Long-term (current) use of injectable non-insulin antidiabetic drugs: Secondary | ICD-10-CM | POA: Diagnosis not present

## 2023-06-25 DIAGNOSIS — E669 Obesity, unspecified: Secondary | ICD-10-CM

## 2023-06-25 DIAGNOSIS — E1169 Type 2 diabetes mellitus with other specified complication: Secondary | ICD-10-CM

## 2023-06-25 DIAGNOSIS — E66812 Obesity, class 2: Secondary | ICD-10-CM

## 2023-06-25 MED ORDER — TIRZEPATIDE 15 MG/0.5ML ~~LOC~~ SOAJ
15.0000 mg | SUBCUTANEOUS | 0 refills | Status: DC
  Filled 2023-06-25: qty 2, 28d supply, fill #0

## 2023-06-25 MED ORDER — OMEPRAZOLE 20 MG PO CPDR: 20.0000 mg | DELAYED_RELEASE_CAPSULE | Freq: Every day | ORAL | 0 refills | Status: DC

## 2023-06-25 NOTE — Progress Notes (Signed)
[name], D.O.  ABFM, ABOM Specializing in Clinical Bariatric Medicine  Office located at: [address] [place], [place]  [address]   Assessment and Plan:   FOR THE DISEASE OF OBESITY: Obesity, current BMI 33.45 Morbid obesity (HCC)-starting bmi 37.94 Assessment & Plan: Since last office visit on 06/09/23 patient's  Muscle mass has decreased by 3.2 lb. Fat mass has decreased by 1.8 lb. Total body water has decreased by 1.4 lb.  Counseling done on how various foods will affect these numbers and how to maximize success  Total lbs lost to date: 27 lbs  Total weight loss percentage to date: 11.84%    Recommended Dietary Goals Sarah [name] is currently in the action stage of change. As such, her goal is to continue weight management plan.  She has agreed to: continue current plan - CAT 3 MP   Behavioral Intervention We discussed the following today: having 30 grams of protein at breakfast, 30 grams at lunch, and 30-40 grams at dinner,  using GPT or another AI platform for recipe ideas- searching "low calorie, low carb, high protein chicken recipes" etc, the Healthy Eating Plate (1/2 plate of lean protein and other half of plate is complex carbs, fruits,& veggies).   Additional resources provided today: None  Evidence-based interventions for health behavior change were utilized today including the discussion of self monitoring techniques, problem-solving barriers and SMART goal setting techniques.   Regarding patient's less desirable eating habits and patterns, we employed the technique of small changes.   Pt will specifically work on: healthy eating with 30 grams of protein at breakfast, 30 grams at lunch and 30-40 grams at dinner and 1350 calories daily as a general guide.    Recommended Physical Activity Goals Sarah [name] has been advised to work up to 150 minutes of moderate intensity aerobic activity a week and strengthening exercises 2-3 times per week for cardiovascular  health, weight loss maintenance and preservation of muscle mass.   She has agreed to : Continue current level of physical activity    Pharmacotherapy We both agreed to : continue with nutritional and behavioral strategies and continue current anti-obesity medication regimen   FOR ASSOCIATED CONDITIONS ADDRESSED TODAY:  Type 2 diabetes mellitus with obesity (HCC) Assessment & Plan: Relevant medications: Mounjaro 15 mg once weekly and Metformin 500 mg daily - she is tolerating both well, denies gastrointestinal issues. She feels that her hunger and cravings are pretty well controlled. Her average blood sugars over the past 7 days have been 112. She occasionally has lows of 69 at night when she is ready to sleep - when this happens she is asymptomatic.   Continue to check blood sugars at home. Eat on a regular basis- no skipping or going long periods without eating. Continue to decrease simple carbs, increase protein intake (Recommend 30 grams of protein at breakfast, 30 grams at lunch, and 30-40 grams at dinner).   Orders: -     Tirzepatide; Inject 15 mg into the skin once a week every Thursday  Dispense: 2 mL; Refill: 0   Gastroesophageal reflux disease, unspecified whether esophagitis present Assessment & Plan: Symptoms are well controlled on Omeprazole 20 mg daily. She has no concerns in this regard today. Requests refill of med. Continue current regimen. Will continue to monitor condition alongside GI physician.   Orders: -     Omeprazole; Take 1 capsule (20 mg total) by mouth daily.  Dispense: 90 capsule; Refill: 0   Follow up:  Return 07/09/2023. She was informed of the importance of frequent follow up visits to maximize her success with intensive lifestyle modifications for her multiple health conditions.  Subjective:   Chief complaint: Obesity Sarah [name] is here to discuss her progress with her obesity treatment plan. She is on the Category 3 Plan and states she is following her  eating plan approximately 60% of the time. She states she is doing silver sneakers 50 minutes, 3 days a week.   Interval History:  Sarah [name] is here for a follow up office visit. Since last OV on 06/09/23, pt is down 5 lbs. She endorses being somewhat inconsistent with her food intake. Someday's, she will adhere to her meal plan. Other days, she will eat "off-plan foods" and skip meals. She does not want to go back to journaling because she often forgets to do it. She requests new recipes ideas today. Mood has been well controlled.   Pharmacotherapy for weight loss: She is currently taking  Bupropion 200 mg bid, Metformin 500 mg daily, and Mounjaro 15 mg weekly. .   Review of Systems:  Pertinent positives were addressed with patient today.  Reviewed by clinician on day of visit: allergies, medications, problem list, medical history, surgical history, family history, social history, and previous encounter notes.  Weight Summary and Biometrics   Weight Lost Since Last Visit: 5 lb  Weight Gained Since Last Visit: 0    Vitals Temp: 97.8 F (36.6 C) BP: 109/73 Pulse Rate: 75 SpO2: 96 %   Anthropometric Measurements Height: 5\' 5"  (1.651 m) Weight: 201 lb (91.2 kg) BMI (Calculated): 33.45 Weight at Last Visit: 206 lb Weight Lost Since Last Visit: 5 lb Weight Gained Since Last Visit: 0 Starting Weight: 228 lb Total Weight Loss (lbs): 27 lb (12.2 kg) Peak Weight: 228 lb   Body Composition  Body Fat %: 36.6 % Fat Mass (lbs): 80 lbs Muscle Mass (lbs): 115.6 lbs Total Body Water (lbs): 72.4 lbs Visceral Fat Rating : 11   Other Clinical Data Fasting: yes Labs: no Today's Visit #: 76 Starting Date: 02/15/20   Objective:   PHYSICAL EXAM: Blood pressure 109/73, pulse 75, temperature 97.8 F (36.6 C), height 5\' 5"  (1.651 m), weight 201 lb (91.2 kg), SpO2 96%. Body mass index is 33.45 kg/m.  General: she is overweight, cooperative and in no acute distress. PSYCH:  Has normal mood, affect and thought process.   HEENT: EOMI, sclerae are anicteric. Lungs: Normal breathing effort, no conversational dyspnea. Extremities: Moves * 4 Neurologic: A and O * 3, good insight  DIAGNOSTIC DATA REVIEWED: BMET    Component Value Date/Time   NA 141 05/22/2023 1140   NA 138 04/22/2016 0750   K 4.7 05/22/2023 1140   K 3.9 04/22/2016 0750   CL 103 05/22/2023 1140   CO2 21 05/22/2023 1140   CO2 26 04/22/2016 0750   GLUCOSE 92 05/22/2023 1140   GLUCOSE 81 03/28/2022 1508   GLUCOSE 142 (H) 04/22/2016 0750   BUN 18 05/22/2023 1140   BUN 19.1 11/20/2016 1355   CREATININE 1.26 (H) 05/22/2023 1140   CREATININE 1.26 (H) 12/29/2019 1015   CREATININE 1.1 11/20/2016 1355   CALCIUM 11.7 (H) 05/22/2023 1140   CALCIUM 9.8 04/22/2016 0750   GFRNONAA 47 (L) 03/28/2022 1508   GFRNONAA 48 (L) 12/29/2019 1015   GFRNONAA 63 07/08/2014 1212   GFRAA 69 04/17/2020 0928   GFRAA 56 (L) 12/29/2019 1015   GFRAA 73 07/08/2014 1212   Lab Results  Component Value Date   HGBA1C 6.4 04/23/2023   HGBA1C 10.1 (H) 07/07/2015   No results found for: "INSULIN" Lab Results  Component Value Date   TSH 1.370 06/21/2022   CBC    Component Value Date/Time   WBC 5.4 03/28/2022 1508   RBC 4.97 03/28/2022 1508   HGB 15.0 03/28/2022 1508   HGB 13.3 02/03/2020 0837   HGB 13.6 12/04/2018 0914   HGB 12.9 04/22/2016 0750   HCT 46.8 (H) 03/28/2022 1508   HCT 41.0 12/04/2018 0914   HCT 39.0 04/22/2016 0750   PLT 318 03/28/2022 1508   PLT 270 02/03/2020 0837   PLT 332 12/04/2018 0914   MCV 94.2 03/28/2022 1508   MCV 88 12/04/2018 0914   MCV 92.2 04/22/2016 0750   MCH 30.2 03/28/2022 1508   MCHC 32.1 03/28/2022 1508   RDW 13.6 03/28/2022 1508   RDW 13.6 12/04/2018 0914   RDW 13.9 04/22/2016 0750   Iron Studies    Component Value Date/Time   IRON 43 08/18/2010 0515   TIBC 389 08/18/2010 0515   FERRITIN 227 08/18/2010 0515   IRONPCTSAT 11 (L) 08/18/2010 0515   Lipid Panel      Component Value Date/Time   CHOL 161 01/20/2023 1032   TRIG 183 (H) 01/20/2023 1032   HDL 46 01/20/2023 1032   CHOLHDL 3.5 01/20/2023 1032   CHOLHDL 5.1 (H) 08/14/2016 1729   VLDL 74 (H) 08/14/2016 1729   LDLCALC 84 01/20/2023 1032   Hepatic Function Panel     Component Value Date/Time   PROT 7.7 10/21/2022 1216   PROT 8.2 04/22/2016 0750   ALBUMIN 4.6 01/09/2022 0855   ALBUMIN 3.8 04/22/2016 0750   AST 26 01/09/2022 0855   AST 29 12/29/2019 1015   AST 27 04/22/2016 0750   ALT 29 01/09/2022 0855   ALT 33 12/29/2019 1015   ALT 24 04/22/2016 0750   ALKPHOS 74 01/09/2022 0855   ALKPHOS 74 04/22/2016 0750   BILITOT 0.5 01/09/2022 0855   BILITOT 0.6 12/29/2019 1015   BILITOT 0.44 04/22/2016 0750   BILIDIR 0.2 08/18/2010 0515   IBILI 1.5 (H) 08/18/2010 0515      Component Value Date/Time   TSH 1.370 06/21/2022 1616   Nutritional Lab Results  Component Value Date   VD25OH 33.96 04/23/2023   VD25OH 29.26 (L) 10/21/2022   VD25OH 31.2 06/21/2022    Attestations:   I, Special Puri, acting as a Stage manager for Thomasene Lot, DO., have compiled all relevant documentation for today's office visit on behalf of Thomasene Lot, DO, while in the presence of [employer], DO.  Reviewed by clinician on day of visit: allergies, medications, problem list, medical history, surgical history, family history, social history, and previous encounter notes pertinent to patient's obesity diagnosis. I have spent 41 minutes in the care of the patient today including: preparing to see patient (e.g. review and interpretation of tests, old notes ), obtaining and/or reviewing separately obtained history, performing a medically appropriate examination or evaluation, counseling and educating the patient, ordering medications, test or procedures, documenting clinical information in the electronic or other health care record, and independently interpreting results and communicating results to the  patient, family, or caregiver   I have reviewed the above documentation for accuracy and completeness, and I agree with the above. [name], D.O.  The 21st Century Cures Act was signed into law in 2016 which includes the topic of electronic health records.  This provides immediate access to  information in MyChart.  This includes consultation notes, operative notes, office notes, lab results and pathology reports.  If you have any questions about what you read please let us know at your next visit so we can discuss your concerns and take corrective action if need be.  We are right here with you.

## 2023-06-26 ENCOUNTER — Other Ambulatory Visit (HOSPITAL_COMMUNITY): Payer: Self-pay

## 2023-06-27 ENCOUNTER — Ambulatory Visit
Admission: RE | Admit: 2023-06-27 | Discharge: 2023-06-27 | Disposition: A | Discharge status: Home / Self Care | Payer: Medicare PPO | Source: Ambulatory Visit | Attending: Internal Medicine | Admitting: Internal Medicine

## 2023-06-27 DIAGNOSIS — Z1231 Encounter for screening mammogram for malignant neoplasm of breast: Secondary | ICD-10-CM | POA: Diagnosis not present

## 2023-06-27 DIAGNOSIS — Z0000 Encounter for general adult medical examination without abnormal findings: Secondary | ICD-10-CM

## 2023-07-01 ENCOUNTER — Encounter: Payer: Self-pay | Admitting: Internal Medicine

## 2023-07-01 ENCOUNTER — Ambulatory Visit: Payer: Medicare PPO | Attending: Internal Medicine

## 2023-07-01 DIAGNOSIS — Z0000 Encounter for general adult medical examination without abnormal findings: Secondary | ICD-10-CM | POA: Diagnosis not present

## 2023-07-01 NOTE — Patient Instructions (Signed)
Ms. Marocco , Thank you for taking time to come for your Medicare Wellness Visit. I appreciate your ongoing commitment to your health goals. Please review the following plan we discussed and let me know if I can assist you in the future.   Referrals/Orders/Follow-Ups/Clinician Recommendations: none  This is a list of the screening recommended for you and due dates:  Health Maintenance  Topic Date Due   HIV Screening  Never done   COVID-19 Vaccine (4 - 2024-25 season) 02/09/2023   Hemoglobin A1C  10/21/2023   Eye exam for diabetics  01/03/2024   Complete foot exam   01/20/2024   Yearly kidney function blood test for diabetes  05/21/2024   Yearly kidney health urinalysis for diabetes  05/21/2024   Medicare Annual Wellness Visit  06/30/2024   Mammogram  06/26/2025   Colon Cancer Screening  10/03/2026   DTaP/Tdap/Td vaccine (2 - Td or Tdap) 11/18/2027   Pneumococcal Vaccination  Completed   Flu Shot  Completed   Hepatitis C Screening  Completed   Zoster (Shingles) Vaccine  Completed   HPV Vaccine  Aged Out    Advanced directives: (ACP Link)Information on Advanced Care Planning can be found at [facility] Advance Health Care Directives Advance Health Care Directives ([email or web address]   Next Medicare Annual Wellness Visit scheduled for next year: Yes  insert Preventive Care attachment Insert FALL PREVENTION attachment if needed

## 2023-07-01 NOTE — Progress Notes (Signed)
Subjective:   Sarah [name] is a 58 y.o. female who presents for Medicare Annual (Subsequent) preventive examination.  Visit Complete: Virtual I connected with  [name] on 07/01/23 by a audio enabled telemedicine application and verified that I am speaking with the correct person using two identifiers  Interactive audio and video telecommunications were attempted between this provider and patient, however failed, due to patient having technical difficulties OR patient did not have access to video capability.  We continued and completed visit with audio only.  Patient Location: Home  Provider Location: Office/Clinic  I discussed the limitations of evaluation and management by telemedicine. The patient expressed understanding and agreed to proceed.  Vital Signs: Because this visit was a virtual/telehealth visit, some criteria may be missing or patient reported. Any vitals not documented were not able to be obtained and vitals that have been documented are patient reported.  Patient Medicare AWV questionnaire was completed by the patient on 06/30/2023; I have confirmed that all information answered by patient is correct and no changes since this date.  Cardiac Risk Factors include: diabetes mellitus;dyslipidemia;hypertension     Objective:    Today's Vitals   07/01/23 1018  PainSc: 8    There is no height or weight on file to calculate BMI.     07/01/2023   10:25 AM 06/28/2022    9:08 AM 03/28/2022   12:23 PM 07/03/2021    3:14 PM 06/26/2021    3:36 PM 06/26/2021    3:00 PM 02/03/2020    7:45 AM  Advanced Directives  Does Patient Have a Medical Advance Directive? No No No No No No No  Would patient like information on creating a medical advance directive?  Yes (ED - Information included in AVS)  No - Patient declined No - Patient declined No - Patient declined No - Patient declined    Current Medications (verified) Outpatient Encounter Medications as of 07/01/2023   Medication Sig   albuterol (VENTOLIN HFA) 108 (90 Base) MCG/ACT inhaler Inhale 2 puffs into the lungs every 6 (six) hours as needed for wheezing or shortness of breath.   amLODipine (NORVASC) 10 MG tablet Take 1 tablet (10 mg total) by mouth daily.   Blood Glucose Monitoring Suppl (ACCU-CHEK GUIDE) w/Device KIT Use to check blood sugar 3 times daily.   buPROPion (WELLBUTRIN SR) 200 MG 12 hr tablet Take 1 tablet (200 mg total) by mouth 2 (two) times daily.   Cholecalciferol (VITAMIN D3) 25 MCG (1000 UT) CAPS Take 1 capsule (1,000 Units total) by mouth daily. Per Endo in 10/2022   Continuous Glucose Receiver (FREESTYLE LIBRE 3 READER) DEVI Use to check glucose continuously. Dx E11.69   Continuous Glucose Sensor (FREESTYLE LIBRE 3 SENSOR) MISC Place 1 sensor on the skin every 14 days. Use to check glucose continuously. Dx E11.69   dorzolamide-timolol (COSOPT) 2-0.5 % ophthalmic solution Place 1 drop into both eyes 2 (two) times daily.   fluticasone (FLONASE) 50 MCG/ACT nasal spray Place 2 sprays into both nostrils daily.   gabapentin (NEURONTIN) 600 MG tablet Take 1 tablet (600 mg total) by mouth 3 (three) times daily.   glucose blood (ACCU-CHEK GUIDE) test strip Use to check blood sugar 3 times daily.   hydrALAZINE (APRESOLINE) 25 MG tablet TAKE 1 AND 1/2 TABLETS THREE TIMES DAILY   Insulin Pen Needle (DROPLET PEN NEEDLES) 29G X MISC USE AS DIRECTED   Lancets Misc. (ACCU-CHEK SOFTCLIX LANCET DEV) KIT Check blood sugars 3 times a day.  latanoprost (XALATAN) 0.005 % ophthalmic solution Place 1 drop into both eyes every evening at bedtime as directed   levocetirizine (XYZAL) 5 MG tablet Take 1 tablet (5 mg total) by mouth every evening.   lidocaine (LIDODERM) 5 % APPLY 1 PATCH ONTO THE SKIN DAILY. REMOVE AND DISCARD PATCH WITHIN 12 HOURS OR AS DIRECTED BY MD   losartan (COZAAR) 50 MG tablet TAKE 1 TABLET EVERY DAY   metFORMIN (GLUCOPHAGE) 500 MG tablet Take 1 tablet (500 mg total) by mouth daily  with breakfast.   metoprolol tartrate (LOPRESSOR) 25 MG tablet TAKE 1/2 TABLET TWICE DAILY   montelukast (SINGULAIR) 10 MG tablet Take 1 tablet (10 mg total) by mouth at bedtime.   omeprazole (PRILOSEC) 20 MG capsule Take 1 capsule (20 mg total) by mouth daily.   ondansetron (ZOFRAN) 4 MG tablet Take 1 tablet by mouth every 6 hours as needed severe nausea   rosuvastatin (CRESTOR) 20 MG tablet Take 1 tablet (20 mg total) by mouth daily.   solifenacin (VESICARE) 5 MG tablet Take 1 tablet (5 mg total) by mouth daily.   tirzepatide [facility]) 15 MG/0.5ML Pen Inject 15 mg into the skin once a week every Thursday   triamcinolone cream (KENALOG) 0.1 % Apply 1 Application topically 2 (two) times daily.   vitamin B-12 (CYANOCOBALAMIN) 500 MCG tablet Take 1 tablet (500 mcg total) by mouth daily.   No facility-administered encounter medications on file as of 07/01/2023.    Allergies (verified) Emend [aprepitant] and Lisinopril   History: Past Medical History:  Diagnosis Date   Arthritis    Diabetes mellitus without complication (HCC)    Family history of adverse reaction to anesthesia    sister-n/v   Family history of colon cancer    GERD (gastroesophageal reflux disease)    Glaucoma 07/2014   Glucagonoma 07/28/2014   Pt denies this but states she has glaucoma   History of chemotherapy    History of hiatal hernia    Hypercalcemia    Hypertension    Imbalance    Neuropathy    feet bilat   Nocturia    Peritoneal carcinomatosis (HCC)    carcinoma of ovary    Shortness of breath dyspnea    hx of 2013 - no problems currently    Sleep apnea    Thyroid nodule    history of    Wears glasses    Past Surgical History:  Procedure Laterality Date   ABDOMINAL HYSTERECTOMY     CATARACT EXTRACTION     RT eye 11/28/22 and LT eye 12/19/2022   FOOT SURGERY  04/2018   [facility]   HERNIA REPAIR     INCISIONAL HERNIA REPAIR N/A 07/06/2015   Procedure: INCISIONAL HERNIA REPAIR ;  Surgeon:  Emelia Loron, MD;  Location: [facility];  Service: General;  Laterality: N/A;   INCISIONAL HERNIA REPAIR N/A 07/03/2021   Procedure: Sherald Hess HERNIA REPAIR WITH MESH;  Surgeon: Axel Filler, MD;  Location: [facility];  Service: General;  Laterality: N/A;   INSERTION OF MESH N/A 07/06/2015   Procedure: WITH INSERTION OF PHASIX ST MESH;  Surgeon: Emelia Loron, MD;  Location: [facility];  Service: General;  Laterality: N/A;   LAPAROTOMY N/A 07/06/2015   Procedure: EXPLORATORY LAPAROTOMY;  Surgeon: Adolphus Birchwood, MD;  Location: [facility];  Service: Gynecology;  Laterality: N/A;   LYSIS OF ADHESION N/A 07/06/2015   Procedure:  LYSIS OF ADHESION RESECTION OF MESENTERIC MASS BOWEL RESECTION ;  Surgeon: Adolphus Birchwood, MD;  Location: [facility];  Service: Gynecology;  Laterality: N/A;   LYSIS OF ADHESION N/A 07/03/2021   Procedure: EXPLORATORY LAPAROTOMY WITH LYSIS OF ADHESION;  Surgeon: Axel Filler, MD;  Location: [facility] OR;  Service: General;  Laterality: N/A;   ROBOTIC ASSISTED TOTAL HYSTERECTOMY WITH BILATERAL SALPINGO OOPHERECTOMY Bilateral 07/28/2014   Procedure: ROBOTIC ASSISTED lysis of adhesions with biopsies, converted to LAPAROTOMY, bilateral salpingoorphorectomy, omentectomy,appendectomy;  Surgeon: Laurette Schimke, MD;  Location: [facility];  Service: Gynecology;  Laterality: Bilateral;   THYROIDECTOMY, PARTIAL     VIDEO BRONCHOSCOPY N/A 01/13/2020   Procedure: VIDEO BRONCHOSCOPY WITH BIOPSIES;  Surgeon: Loreli Slot, MD;  Location[facility];  Service: Thoracic;  Laterality: N/A;   Family History  Problem Relation Age of Onset   Hypertension Mother    Hyperlipidemia Mother    Stroke Mother    Thyroid disease Mother    Arthritis Mother    Hearing loss Mother    Hypertension Father    Diabetes Father    Hyperlipidemia Father    Sudden death Father    Arthritis Father    Heart disease Father    Obesity Father    Cancer Sister 74       fibrosarcoma (back); currently 75   Cancer Brother     Prostate cancer Maternal Uncle    Colon cancer Paternal Aunt        Dx 82s; deceased 23s   Prostate cancer Paternal Uncle        currently 51   Cancer Paternal Uncle 82       "bone" ; unk. primary   Stomach cancer Paternal Uncle    Hypercalcemia Neg Hx    Social History   Socioeconomic History   Marital status: Married    Spouse name: Not on file   Number of children: 2   Years of education: Not on file   Highest education level: 12th grade  Occupational History   Occupation: disability  Tobacco Use   Smoking status: Never   Smokeless tobacco: Never  Vaping Use   Vaping status: Never Used  Substance and Sexual Activity   Alcohol use: No   Drug use: No   Sexual activity: Not Currently    Birth control/protection: None  Other Topics Concern   Not on file  Social History Narrative   No issues with transportation.    Attends church   Social Drivers of Corporate investment banker Strain: Low Risk  (07/01/2023)   Overall Financial Resource Strain (CARDIA)    Difficulty of Paying Living Expenses: Not hard at all  Recent Concern: Financial Resource Strain - Medium Risk (05/19/2023)   Overall Financial Resource Strain (CARDIA)    Difficulty of Paying Living Expenses: Somewhat hard  Food Insecurity: No Food Insecurity (07/01/2023)   Hunger Vital Sign    Worried About Running Out of Food in the Last Year: Never true    Ran Out of Food in the Last Year: Never true  Transportation Needs: No Transportation Needs (07/01/2023)   PRAPARE - Administrator, Civil Service (Medical): No    Lack of Transportation (Non-Medical): No  Physical Activity: Sufficiently Active (07/01/2023)   Exercise Vital Sign    Days of Exercise per Week: 3 days    Minutes of Exercise per Session: 60 min  Stress: No Stress Concern Present (07/01/2023)   [employer] of Occupational Health - Occupational Stress Questionnaire    Feeling of Stress : Not at all  Social Connections: Moderately  Integrated (07/01/2023)   Social Connection and Isolation Panel [NHANES]    Frequency of Communication with Friends and Family: More than three times a week    Frequency of Social Gatherings with Friends and Family: More than three times a week    Attends Religious Services: More than 4 times per year    Active Member of [employer] or Organizations: No    Attends Engineer, structural: Never    Marital Status: Married    Tobacco Counseling Counseling given: Not Answered   Clinical Intake:  Pre-visit preparation completed: Yes  Pain : 0-10 Pain Score: 8  Pain Type: Chronic pain Pain Location: Back Pain Orientation: Lower Pain Descriptors / Indicators: Aching Pain Onset: More than a month ago Pain Frequency: Constant     Nutritional Risks: Nausea/ vomitting/ diarrhea (nausea last night) Diabetes: Yes CBG done?: No Did pt. bring in CBG monitor from home?: No  How often do you need to have someone help you when you read instructions, pamphlets, or other written materials from your doctor or pharmacy?: 1 - Never  Interpreter Needed?: No  Information entered by :: NAllen LPN   Activities of Daily Living    06/30/2023    2:33 PM  In your present state of health, do you have any difficulty performing the following activities:  Hearing? 0  Vision? 0  Difficulty concentrating or making decisions? 0  Walking or climbing stairs? 0  Dressing or bathing? 0  Doing errands, shopping? 0  Preparing Food and eating ? N  Using the Toilet? N  In the past six months, have you accidently leaked urine? Y  Do you have problems with loss of bowel control? N  Managing your Medications? N  Managing your Finances? N  Housekeeping or managing your Housekeeping? N    Patient Care Team: Marcine Matar, MD as PCP - General (Internal Medicine) Augustin Schooling, MD as Consulting Physician  Indicate any recent Medical Services you may have received from other than [facility] providers in the  past year (date may be approximate).     Assessment:   This is a routine wellness examination for Areyonna.  Hearing/Vision screen Hearing Screening - Comments:: Denies hearing issues Vision Screening - Comments:: Regular eye exams, Dr. Fredderick Phenix,    Goals Addressed             This Visit's Progress    Patient Stated       07/01/2023, wants to lose weight       Depression Screen    07/01/2023   10:27 AM 01/20/2023    9:22 AM 09/17/2022   11:41 AM 07/19/2022    2:26 PM 06/28/2022    9:07 AM 05/17/2022   10:00 AM 05/17/2022    9:59 AM  PHQ 2/9 Scores  PHQ - 2 Score 0 0 0 0 0 0 0  PHQ- 9 Score  2 0 1  3     Fall Risk    06/30/2023    2:33 PM 01/20/2023    9:21 AM 09/17/2022   11:33 AM 06/28/2022    9:08 AM 06/26/2022    8:41 PM  Fall Risk   Falls in the past year? 0 0 0 1 0  Number falls in past yr: 0 0 0 0 0  Injury with Fall? 0 0 0 0 1  Risk for fall due to : Medication side effect No Fall Risks No Fall Risks    Follow up Falls prevention discussed;Falls evaluation  completed Falls evaluation completed       MEDICARE RISK AT HOME: Medicare Risk at Home Any stairs in or around the home?: (Patient-Rptd) Yes If so, are there any without handrails?: (Patient-Rptd) No Home free of loose throw rugs in walkways, pet beds, electrical cords, etc?: (Patient-Rptd) Yes Adequate lighting in your home to reduce risk of falls?: (Patient-Rptd) Yes Life alert?: (Patient-Rptd) No Use of a cane, walker or w/c?: (Patient-Rptd) No Grab bars in the bathroom?: (Patient-Rptd) No Shower chair or bench in shower?: (Patient-Rptd) No Elevated toilet seat or a handicapped toilet?: (Patient-Rptd) No  TIMED UP AND GO:  Was the test performed?  No    Cognitive Function:    06/28/2022    9:08 AM 06/26/2021    3:38 PM 01/28/2020   10:33 AM  MMSE - Mini Mental State Exam  Orientation to time 5 5 5   Orientation to Place 5 5 5   Registration 3 3 3   Attention/ Calculation 5 5 5   Recall 3 3 3    Language- name 2 objects 2 2 2   Language- repeat 1 1 1   Language- follow 3 step command 3 3 3   Language- read & follow direction 1 1 1   Write a sentence 1 1 1   Copy design 1 1 1   Total score 30 30 30         07/01/2023   10:27 AM 06/28/2022    9:09 AM  6CIT Screen  What Year? 0 points 0 points  What month? 0 points 0 points  What time? 0 points 0 points  Count back from 20 0 points 0 points  Months in reverse 0 points 0 points  Repeat phrase 2 points 0 points  Total Score 2 points 0 points    Immunizations Immunization History  Administered Date(s) Administered   Influenza Whole 03/22/2019   Influenza, Seasonal, Injecte, Preservative Fre 02/14/2023   Influenza,inj,Quad PF,6+ Mos 06/29/2014, 03/27/2015, 03/14/2016, 02/17/2017, 02/20/2018, 01/28/2020, 04/04/2022   Influenza-Unspecified 03/10/2021   PFIZER(Purple Top)SARS-COV-2 Vaccination 09/10/2019, 10/04/2019   Pfizer Covid-19 Vaccine Bivalent Booster 39yrs & up 04/03/2021   Pneumococcal Conjugate (Pcv15) 05/28/2021   Pneumococcal Polysaccharide-23 06/29/2014   Tdap 11/17/2017   Zoster Recombinant(Shingrix) 01/28/2020, 04/28/2020    TDAP status: Up to date  Flu Vaccine status: Up to date  Pneumococcal vaccine status: Up to date  Covid-19 vaccine status: Information provided on how to obtain vaccines.   Qualifies for Shingles Vaccine? Yes   Zostavax completed No   Shingrix Completed?: Yes  Screening Tests Health Maintenance  Topic Date Due   HIV Screening  Never done   COVID-19 Vaccine (4 - 2024-25 season) 02/09/2023   HEMOGLOBIN A1C  10/21/2023   OPHTHALMOLOGY EXAM  01/03/2024   FOOT EXAM  01/20/2024   Diabetic kidney evaluation - eGFR measurement  05/21/2024   Diabetic kidney evaluation - Urine ACR  05/21/2024   Medicare Annual Wellness (AWV)  06/30/2024   MAMMOGRAM  06/26/2025   Colonoscopy  10/03/2026   DTaP/Tdap/Td (2 - Td or Tdap) 11/18/2027   Pneumococcal Vaccine 83-67 Years old  Completed    INFLUENZA VACCINE  Completed   Hepatitis C Screening  Completed   Zoster Vaccines- Shingrix  Completed   HPV VACCINES  Aged Out    Health Maintenance  Health Maintenance Due  Topic Date Due   HIV Screening  Never done   COVID-19 Vaccine (4 - 2024-25 season) 02/09/2023    Colorectal cancer screening: Type of screening: Colonoscopy. Completed 10/02/2016. Repeat every 10 years  Mammogram status: Completed 06/27/2023. Repeat every year  Bone Density status: n/a  Lung Cancer Screening: (Low Dose CT Chest recommended if Age 23-80 years, 20 pack-year currently smoking OR have quit w/in 15years.) does not qualify.   Lung Cancer Screening Referral: no  Additional Screening:  Hepatitis C Screening: does qualify; Completed 08/20/2010  Vision Screening: Recommended annual ophthalmology exams for early detection of glaucoma and other disorders of the eye. Is the patient up to date with their annual eye exam?  Yes  Who is the provider or what is the name of the office in which the patient attends annual eye exams? Dr. Wynelle Link If pt is not established with a provider, would they like to be referred to a provider to establish care? No .   Dental Screening: Recommended annual dental exams for proper oral hygiene  Diabetic Foot Exam: Diabetic Foot Exam: Completed 01/20/2023  Community Resource Referral / Chronic Care Management: CRR required this visit?  No   CCM required this visit?  No     Plan:     I have personally reviewed and noted the following in the patient's chart:   Medical and social history Use of alcohol, tobacco or illicit drugs  Current medications and supplements including opioid prescriptions. Patient is not currently taking opioid prescriptions. Functional ability and status Nutritional status Physical activity Advanced directives List of other physicians Hospitalizations, surgeries, and ER visits in previous 12 months Vitals Screenings to include cognitive,  depression, and falls Referrals and appointments  In addition, I have reviewed and discussed with patient certain preventive protocols, quality metrics, and best practice recommendations. A written personalized care plan for preventive services as well as general preventive health recommendations were provided to patient.     Barb Merino, LPN   [id number]/61/0960   After Visit Summary: (MyChart) Due to this being a telephonic visit, the after visit summary with patients personalized plan was offered to patient via MyChart   Nurse Notes: none

## 2023-07-09 ENCOUNTER — Ambulatory Visit (INDEPENDENT_AMBULATORY_CARE_PROVIDER_SITE_OTHER): Payer: Medicare PPO | Admitting: Family Medicine

## 2023-07-09 ENCOUNTER — Encounter (INDEPENDENT_AMBULATORY_CARE_PROVIDER_SITE_OTHER): Payer: Self-pay | Admitting: Family Medicine

## 2023-07-09 ENCOUNTER — Other Ambulatory Visit (HOSPITAL_COMMUNITY): Payer: Self-pay

## 2023-07-09 VITALS — BP 118/78 | HR 80 | Temp 97.5°F | Ht 65.0 in | Wt 199.0 lb

## 2023-07-09 DIAGNOSIS — Z7985 Long-term (current) use of injectable non-insulin antidiabetic drugs: Secondary | ICD-10-CM | POA: Diagnosis not present

## 2023-07-09 DIAGNOSIS — E669 Obesity, unspecified: Secondary | ICD-10-CM

## 2023-07-09 DIAGNOSIS — I152 Hypertension secondary to endocrine disorders: Secondary | ICD-10-CM

## 2023-07-09 DIAGNOSIS — Z6833 Body mass index (BMI) 33.0-33.9, adult: Secondary | ICD-10-CM

## 2023-07-09 DIAGNOSIS — E1169 Type 2 diabetes mellitus with other specified complication: Secondary | ICD-10-CM

## 2023-07-09 DIAGNOSIS — E1142 Type 2 diabetes mellitus with diabetic polyneuropathy: Secondary | ICD-10-CM | POA: Diagnosis not present

## 2023-07-09 DIAGNOSIS — Z7984 Long term (current) use of oral hypoglycemic drugs: Secondary | ICD-10-CM | POA: Diagnosis not present

## 2023-07-09 DIAGNOSIS — E1159 Type 2 diabetes mellitus with other circulatory complications: Secondary | ICD-10-CM

## 2023-07-09 MED ORDER — TIRZEPATIDE 15 MG/0.5ML ~~LOC~~ SOAJ
15.0000 mg | SUBCUTANEOUS | 0 refills | Status: DC
  Filled 2023-07-09: qty 2, 28d supply, fill #0

## 2023-07-09 NOTE — Progress Notes (Signed)
Sarah [name], D.O.  ABFM, ABOM Specializing in Clinical Bariatric Medicine  Office located at: [address] [place], [place]  [address]   Assessment and Plan:   Will check CMP, B12, A1C, and lipid panel at her next visit or the next.   FOR THE DISEASE OF OBESITY: Obesity with starting BMI 37.9/date 02/15/2020 BMI 33.0-33.9,adult --  Current BMI 33.12 Assessment & Plan: Since last office visit on 06/25/23 patient's muscle mass has increased by 0.4lb. Fat mass has decreased by 2.4lb. Total body water has decreased by 0.6lb.  Counseling done on how various foods will affect these numbers and how to maximize success  Total lbs lost to date: 29 lbs Total weight loss percentage to date: -12.72%   Recommended Dietary Goals Sarah [name] is currently in the action stage of change. As such, her goal is to continue weight management plan.  She has agreed to: continue current plan -- 1350 calories and 90+ g of protein daily    Behavioral Intervention We discussed the following today: increasing lean protein intake to established goals, decreasing simple carbohydrates , avoiding skipping meals, increasing water intake , work on tracking and journaling calories using tracking application, decreasing eating out or consumption of processed foods, and making healthy choices when eating convenient foods, continue to practice mindfulness when eating, and continue to work on maintaining a reduced calorie state, getting the recommended amount of protein, incorporating whole foods, making healthy choices, staying well hydrated and practicing mindfulness when eating.  Additional resources provided today: handout on protein content of various foods and a new journal log  Evidence-based interventions for health behavior change were utilized today including the discussion of self monitoring techniques, problem-solving barriers and SMART goal setting techniques.   Regarding patient's less desirable eating  habits and patterns, we employed the technique of small changes.   Pt will specifically work on: Increase her water, increasing protein intake and increasing frequency/intensity of weight training for next visit.   Recommended Physical Activity Goals Sarah [name] has been advised to work up to 150 minutes of moderate intensity aerobic activity a week and strengthening exercises 2-3 times per week for cardiovascular health, weight loss maintenance and preservation of muscle mass.   She has agreed to :  Think about enjoyable ways to increase daily physical activity and overcoming barriers to exercise, Increase physical activity in their day and reduce sedentary time (increase NEAT)., Increase the intensity, frequency or duration of strengthening exercises , and Increase the intensity, frequency or duration of aerobic exercises   Encouraged pt to add weight training for 2 days a week.   Pharmacotherapy We discussed various medication options to help Sarah [name] with her weight loss efforts and we both agreed to : continue with nutritional and behavioral strategies and adequate clinical response to current dose, continue current regimen   FOR ASSOCIATED CONDITIONS ADDRESSED TODAY: Type 2 diabetes mellitus with obesity Sarah [name]) Assessment & Plan: Lab Results  Component Value Date   HGBA1C 6.4 04/23/2023   HGBA1C 6.3 01/20/2023   HGBA1C 6.0 09/17/2022    Pt is on Mounjaro 15 mg once weekly (Thursdays) and Metformin 500 mg once daily with breakfast. Tolerating well with no SE reported today. Per pt, she has had good effect with her medications. She reports her sugars are averaging in the low 100s. Has occasional los fasting sugars in the high 60s.   Reviewed normal fasting sugars ranging from 50-99. If Sarah [name] is somewhere within this range when fasting, it is not  concerning. Also reviewed ideal range for normal A1C in comparison to goal A1C for diabetics. Advised pt to avoid skipping meals and not eating on a  regular basis. Continue to monitor her sugars. Continue with current regimen as directed.   Orders: - Refill Mounjaro 15 mg once weekly   Hypertension associated with diabetes Sarah [name]) Assessment & Plan: BP Readings from Last 3 Encounters:  07/09/23 118/78  06/25/23 109/73  06/09/23 128/83   BP is at goal today. Pt is on Amlodipine 10 mg once daily, Losartan 50 mg once daily, and Lopressor 12.5 BID. Tolerating well with no adverse side effects.   Highly recommend pt to discuss options of weaning off Lopressor and eventually discontinuing due to its obesogenic effects. She follows up with her PCP in April and will discuss further options at that visit. Encouraged pt to increase her water intake and increase the intensity of her exercise routine. Continue with current antihypertensive regimen until f/u with PCP.    Diabetic polyneuropathy associated with type 2 diabetes mellitus (HCC) Assessment & Plan: Pt is currently on Gabapentin 600 mg TID to manage her bilateral diabetic neuropathy. She endorses neuropathic pain and she is unsure Gabapentin has helped improve her pain. No side effects reported today. I highly recommend she discuss with her PCP about her options of getting off Gabapentin given its obesogenic effects and inadequate clinical response. Pt mutually agrees with this. Continue with gabapentin until discussion is had with PCP.    Follow up:   Return in about 2 weeks (around 07/23/2023). She was informed of the importance of frequent follow up visits to maximize her success with intensive lifestyle modifications for her multiple health conditions.  Subjective:   Chief complaint: Obesity Sarah [name] is here to discuss her progress with her obesity treatment plan. She is on the Category 3 Plan and states she is following her eating plan approximately 70-75% of the time. She states she is doing silver sneakers 50 minutes 3 days per week. Increased her weights from 3 lbs to 5 lbs.    Interval History:  Sarah [name] is here for a follow up office visit. Since last OV, she is down 2 lbs. She has been working on prioritizing protein at her meal times. She has been journaling to increased mindfulness and found it to be very helpful. Has cut snacking on chips and cookies, not craving them as much since she stopped eating them. Is meeting her meal plan goals about 75% of the time and most days eating 50+ g of protein.    Barriers identified: orthopedic problems, medical conditions or chronic pain affecting mobility and presence of obesogenic drugs.   Pharmacotherapy for weight loss: She is currently taking Metformin (off label use for incretin effect and / or insulin resistance and / or diabetes prevention) with adequate clinical response  and without side effects. and Monjauro with diabetes as the primary indication with adequate clinical response  and without side effects..   Review of Systems:  Pertinent positives were addressed with patient today.  Reviewed by clinician on day of visit: allergies, medications, problem list, medical history, surgical history, family history, social history, and previous encounter notes.  Weight Summary and Biometrics   Weight Lost Since Last Visit: 2 lb  Weight Gained Since Last Visit: 0    Vitals Temp: (!) 97.5 F (36.4 C) BP: 118/78 Pulse Rate: 80 SpO2: 99 %   Anthropometric Measurements Height: 5\' 5"  (1.651 m) Weight: 199 lb (90.3 kg) BMI (Calculated):  33.12 Weight at Last Visit: 201 lb Weight Lost Since Last Visit: 2 lb Weight Gained Since Last Visit: 0 Starting Weight: 228 lb Total Weight Loss (lbs): 29 lb (13.2 kg) Peak Weight: 228 lb   Body Composition  Body Fat %: 38.8 % Fat Mass (lbs): 77.6 lbs Muscle Mass (lbs): 116 lbs Total Body Water (lbs): 71.8 lbs Visceral Fat Rating : 10   Other Clinical Data Fasting: No Labs: No Today's Visit #: 83 Starting Date: 02/15/20    Objective:   PHYSICAL  EXAM: Blood pressure 118/78, pulse 80, temperature (!) 97.5 F (36.4 C), height 5\' 5"  (1.651 m), weight 199 lb (90.3 kg), SpO2 99%. Body mass index is 33.12 kg/m.  General: she is overweight, cooperative and in no acute distress. PSYCH: Has normal mood, affect and thought process.   HEENT: EOMI, sclerae are anicteric. Lungs: Normal breathing effort, no conversational dyspnea. Extremities: Moves * 4 Neurologic: A and O * 3, good insight  DIAGNOSTIC DATA REVIEWED: BMET    Component Value Date/Time   NA 141 05/22/2023 1140   NA 138 04/22/2016 0750   K 4.7 05/22/2023 1140   K 3.9 04/22/2016 0750   CL 103 05/22/2023 1140   CO2 21 05/22/2023 1140   CO2 26 04/22/2016 0750   GLUCOSE 92 05/22/2023 1140   GLUCOSE 81 03/28/2022 1508   GLUCOSE 142 (H) 04/22/2016 0750   BUN 18 05/22/2023 1140   BUN 19.1 11/20/2016 1355   CREATININE 1.26 (H) 05/22/2023 1140   CREATININE 1.26 (H) 12/29/2019 1015   CREATININE 1.1 11/20/2016 1355   CALCIUM 11.7 (H) 05/22/2023 1140   CALCIUM 9.8 04/22/2016 0750   GFRNONAA 47 (L) 03/28/2022 1508   GFRNONAA 48 (L) 12/29/2019 1015   GFRNONAA 63 07/08/2014 1212   GFRAA 69 04/17/2020 0928   GFRAA 56 (L) 12/29/2019 1015   GFRAA 73 07/08/2014 1212   Lab Results  Component Value Date   HGBA1C 6.4 04/23/2023   HGBA1C 10.1 (H) 07/07/2015   No results found for: "INSULIN" Lab Results  Component Value Date   TSH 1.370 06/21/2022   CBC    Component Value Date/Time   WBC 5.4 03/28/2022 1508   RBC 4.97 03/28/2022 1508   HGB 15.0 03/28/2022 1508   HGB 13.3 02/03/2020 0837   HGB 13.6 12/04/2018 0914   HGB 12.9 04/22/2016 0750   HCT 46.8 (H) 03/28/2022 1508   HCT 41.0 12/04/2018 0914   HCT 39.0 04/22/2016 0750   PLT 318 03/28/2022 1508   PLT 270 02/03/2020 0837   PLT 332 12/04/2018 0914   MCV 94.2 03/28/2022 1508   MCV 88 12/04/2018 0914   MCV 92.2 04/22/2016 0750   MCH 30.2 03/28/2022 1508   MCHC 32.1 03/28/2022 1508   RDW 13.6 03/28/2022 1508    RDW 13.6 12/04/2018 0914   RDW 13.9 04/22/2016 0750   Iron Studies    Component Value Date/Time   IRON 43 08/18/2010 0515   TIBC 389 08/18/2010 0515   FERRITIN 227 08/18/2010 0515   IRONPCTSAT 11 (L) 08/18/2010 0515   Lipid Panel     Component Value Date/Time   CHOL 161 01/20/2023 1032   TRIG 183 (H) 01/20/2023 1032   HDL 46 01/20/2023 1032   CHOLHDL 3.5 01/20/2023 1032   CHOLHDL 5.1 (H) 08/14/2016 1729   VLDL 74 (H) 08/14/2016 1729   LDLCALC 84 01/20/2023 1032   Hepatic Function Panel     Component Value Date/Time   PROT 7.7 10/21/2022 1216  PROT 8.2 04/22/2016 0750   ALBUMIN 4.6 01/09/2022 0855   ALBUMIN 3.8 04/22/2016 0750   AST 26 01/09/2022 0855   AST 29 12/29/2019 1015   AST 27 04/22/2016 0750   ALT 29 01/09/2022 0855   ALT 33 12/29/2019 1015   ALT 24 04/22/2016 0750   ALKPHOS 74 01/09/2022 0855   ALKPHOS 74 04/22/2016 0750   BILITOT 0.5 01/09/2022 0855   BILITOT 0.6 12/29/2019 1015   BILITOT 0.44 04/22/2016 0750   BILIDIR 0.2 08/18/2010 0515   IBILI 1.5 (H) 08/18/2010 0515      Component Value Date/Time   TSH 1.370 06/21/2022 1616   Nutritional Lab Results  Component Value Date   VD25OH 33.96 04/23/2023   VD25OH 29.26 (L) 10/21/2022   VD25OH 31.2 06/21/2022    Attestations:   I, Isabelle Course, acting as a medical scribe for Thomasene Lot, DO., have compiled all relevant documentation for today's office visit on behalf of Thomasene Lot, DO, while in the presence of [employer], DO.  Reviewed by clinician on day of visit: allergies, medications, problem list, medical history, surgical history, family history, social history, and previous encounter notes pertinent to patient's obesity diagnosis.  I have spent 40 minutes in the care of the patient today including: preparing to see patient (e.g. review and interpretation of tests, old notes ), obtaining and/or reviewing separately obtained history, performing a medically appropriate examination  or evaluation, counseling and educating the patient, ordering medications, test or procedures, documenting clinical information in the electronic or other health care record, and independently interpreting results and communicating results to the patient, family, or caregiver   I have reviewed the above documentation for accuracy and completeness, and I agree with the above. Sarah [name], D.O.  The 21st Century Cures Act was signed into law in 2016 which includes the topic of electronic health records.  This provides immediate access to information in MyChart.  This includes consultation notes, operative notes, office notes, lab results and pathology reports.  If you have any questions about what you read please let us know at your next visit so we can discuss your concerns and take corrective action if need be.  We are right here with you.

## 2023-07-18 ENCOUNTER — Other Ambulatory Visit: Payer: Self-pay | Admitting: Emergency Medicine

## 2023-07-23 ENCOUNTER — Ambulatory Visit (INDEPENDENT_AMBULATORY_CARE_PROVIDER_SITE_OTHER): Payer: Medicare HMO | Admitting: Family Medicine

## 2023-07-23 ENCOUNTER — Encounter (INDEPENDENT_AMBULATORY_CARE_PROVIDER_SITE_OTHER): Payer: Self-pay | Admitting: Family Medicine

## 2023-07-23 VITALS — BP 131/75 | HR 65 | Temp 97.7°F | Ht 65.0 in | Wt 200.0 lb

## 2023-07-23 DIAGNOSIS — E1169 Type 2 diabetes mellitus with other specified complication: Secondary | ICD-10-CM | POA: Diagnosis not present

## 2023-07-23 DIAGNOSIS — E669 Obesity, unspecified: Secondary | ICD-10-CM

## 2023-07-23 DIAGNOSIS — Z7985 Long-term (current) use of injectable non-insulin antidiabetic drugs: Secondary | ICD-10-CM | POA: Diagnosis not present

## 2023-07-23 DIAGNOSIS — E782 Mixed hyperlipidemia: Secondary | ICD-10-CM | POA: Diagnosis not present

## 2023-07-23 DIAGNOSIS — Z6833 Body mass index (BMI) 33.0-33.9, adult: Secondary | ICD-10-CM

## 2023-07-23 DIAGNOSIS — E538 Deficiency of other specified B group vitamins: Secondary | ICD-10-CM | POA: Diagnosis not present

## 2023-07-23 NOTE — Progress Notes (Signed)
Sarah [name], D.O.  ABFM, ABOM Specializing in Clinical Bariatric Medicine  Office located at: [address] [place], [place]  [address]   Assessment and Plan:   Labs obtained today (CMP, B12, A1C, lipid panel) will be reviewed at her next OV.   FOR THE DISEASE OF OBESITY: Obesity with starting BMI 37.9/date 02/15/2020 BMI 33.0-33.9,adult --  Current BMI 33.28 Assessment & Plan: Since last office visit on 07/09/23 patient's muscle mass has decreased by 1.4lb. Fat mass has increased by 2.6lb. Total body water has increased by 1.8lb.  Counseling done on how various foods will affect these numbers and how to maximize success  Total lbs lost to date: 28 lbs Total weight loss percentage to date: -12.28%    Recommended Dietary Goals [place] is currently in the action stage of change. As such, her goal is to continue weight management plan.  She has agreed to: continue current plan   Behavioral Intervention We discussed the following today: increasing lean protein intake to established goals, decreasing simple carbohydrates , avoiding skipping meals, increasing water intake , identifying sources and decreasing liquid calories, and better snacking choices  Additional resources provided today: Handout on healthy eating and balanced plate and Handout on non-starchy vegetables.  Evidence-based interventions for health behavior change were utilized today including the discussion of self monitoring techniques, problem-solving barriers and SMART goal setting techniques.   Regarding patient's less desirable eating habits and patterns, we employed the technique of small changes.   Pt will specifically work on: increase protein intake and watch/limit carb intake for next visit.    Recommended Physical Activity Goals Sarah has been advised to work up to 150 minutes of moderate intensity aerobic activity a week and strengthening exercises 2-3 times per week for cardiovascular health,  weight loss maintenance and preservation of muscle mass.   She has agreed to :  Continue current level of physical activity  and try to work in weight lifting to her exercise routine.    Pharmacotherapy We both agreed to: continue with nutritional and behavioral strategies and adequate clinical response to current dose, continue current regimen Pt is taking Wellbutrin SR in addition to [facility]. Tolerates Wellbutrin well with good compliance.   FOR ASSOCIATED CONDITIONS ADDRESSED TODAY: Type 2 diabetes mellitus with obesity [facility]) Assessment & Plan: Lab Results  Component Value Date   HGBA1C 6.4 04/23/2023   HGBA1C 6.3 01/20/2023   HGBA1C 6.0 09/17/2022    Pt is on Mounjaro 15 mg once weekly with good compliance and tolerance. She has been monitoring her sugars at home stating they have improved. Reports a reading of 113 this morning and the lowest reading was 92. Pt has been eating all 3 meals, not skipping meals. Not having cravings like she used to, not wanting any chips before bed. Pt states she has been eating a lot of vegetables, has them as snacks.   Encouraged pt to increase her protein intake, scale back on vegetables and prioritize lean proteins. Reviewed high protein snacks other than vegetables. Aim to drink at least half her body weight in ounces of water per day. Continue with current medication regimen. Will recheck A1C and CMP and review with pt at her next office visit.   Orders: - Recheck A1C  - Recheck CMP   Mixed diabetic hyperlipidemia associated with type 2 diabetes mellitus (HCC) Assessment & Plan: Lab Results  Component Value Date   CHOL 161 01/20/2023   HDL 46 01/20/2023   LDLCALC 84  01/20/2023   TRIG 183 (H) 01/20/2023   CHOLHDL 3.5 01/20/2023   Last labs in 01/2023 revealed elevated triglycerides at 183. Pt is taking Crestor 20 mg once daily. Tolerating well with no adverse side effects.   Continue with current statin regimen as prescribed. Advised pt  to continue working on eating a heart healthy diet via her prudent nutritional meal plan. Encouraged her to incorporate weight lifting into her exercise routine. Will recheck lipid panel today and review with pt at her next visit.   Orders: - Recheck Lipid panel   B12 deficiency Assessment & Plan: Lab Results  Component Value Date   VITAMINB12 980 01/09/2022   VITAMINB12 1,389 (H) 06/26/2021   VITAMINB12 917 11/29/2020   Pt last B12 was 980 in 01/2023. Has not been checked since then. She has continued to take B12 500 mcg. Continue with current supplementation. Will continue to monitor condition and recheck her B12 today. B12 lab results will be reviewed at her next OV.   Orders: - Recheck B12 today   Follow up:   Return in about 2 weeks (around 08/06/2023). She was informed of the importance of frequent follow up visits to maximize her success with intensive lifestyle modifications for her multiple health conditions.  Subjective:   Chief complaint: Obesity [name] is here to discuss her progress with her obesity treatment plan. She is on the Category 3 Plan and states she is following her eating plan approximately 75% of the time. 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 States she lifted weights for only one day.  Interval History:  SHANNYN [name] is here for a follow up office visit. Since last OV on 07/09/23, she is up 1 lb. Has been more intentional about eating more and not skipping meals. Has been journaling which helped improve her mindfulness. When she wanted a hamburger, she decided to instead make it homemade. Not cravings chips like she used to. Not drinking as much water as she previously would. She has been drinking zero calorie tea.   Pharmacotherapy for weight loss: She is currently taking Metformin (off label use for incretin effect and / or insulin resistance and / or diabetes prevention) with adequate clinical response  and  without side effects., Bupropion (single agent, off label use) with adequate clinical response  and without side effects., and Monjauro with diabetes as the primary indication with adequate clinical response  and without side effects..   Review of Systems:  Pertinent positives were addressed with patient today.  Reviewed by clinician on day of visit: allergies, medications, problem list, medical history, surgical history, family history, social history, and previous encounter notes.  Weight Summary and Biometrics   Weight Lost Since Last Visit: 0  Weight Gained Since Last Visit: 1lb    Vitals Temp: 97.7 F (36.5 C) BP: 131/75 Pulse Rate: 65 SpO2: 99 %   Anthropometric Measurements Height: 5\' 5"  (1.651 m) Weight: 200 lb (90.7 kg) BMI (Calculated): 33.28 Weight at Last Visit: 199lb Weight Lost Since Last Visit: 0 Weight Gained Since Last Visit: 1lb Starting Weight: 228lb Total Weight Loss (lbs): 28 lb (12.7 kg) Peak Weight: 228lb   Body Composition  Body Fat %: 39.9 % Fat Mass (lbs): 80.2 lbs Muscle Mass (lbs): 114.6 lbs Total Body Water (lbs): 73.6 lbs Visceral Fat Rating : 11   Other Clinical Data Fasting: yes Labs: yes Today's Visit #: 78 Starting Date: 02/15/20    Objective:  PHYSICAL EXAM: Blood pressure 131/75, pulse 65, temperature 97.7 F (36.5 C), height 5\' 5"  (1.651 m), weight 200 lb (90.7 kg), SpO2 99%. Body mass index is 33.28 kg/m.  General: she is overweight, cooperative and in no acute distress. PSYCH: Has normal mood, affect and thought process.   HEENT: EOMI, sclerae are anicteric. Lungs: Normal breathing effort, no conversational dyspnea. Extremities: Moves * 4 Neurologic: A and O * 3, good insight  DIAGNOSTIC DATA REVIEWED: BMET    Component Value Date/Time   NA 141 05/22/2023 1140   NA 138 04/22/2016 0750   K 4.7 05/22/2023 1140   K 3.9 04/22/2016 0750   CL 103 05/22/2023 1140   CO2 21 05/22/2023 1140   CO2 26 04/22/2016  0750   GLUCOSE 92 05/22/2023 1140   GLUCOSE 81 03/28/2022 1508   GLUCOSE 142 (H) 04/22/2016 0750   BUN 18 05/22/2023 1140   BUN 19.1 11/20/2016 1355   CREATININE 1.26 (H) 05/22/2023 1140   CREATININE 1.26 (H) 12/29/2019 1015   CREATININE 1.1 11/20/2016 1355   CALCIUM 11.7 (H) 05/22/2023 1140   CALCIUM 9.8 04/22/2016 0750   GFRNONAA 47 (L) 03/28/2022 1508   GFRNONAA 48 (L) 12/29/2019 1015   GFRNONAA 63 07/08/2014 1212   GFRAA 69 04/17/2020 0928   GFRAA 56 (L) 12/29/2019 1015   GFRAA 73 07/08/2014 1212   Lab Results  Component Value Date   HGBA1C 6.4 04/23/2023   HGBA1C 10.1 (H) 07/07/2015   No results found for: "INSULIN" Lab Results  Component Value Date   TSH 1.370 06/21/2022   CBC    Component Value Date/Time   WBC 5.4 03/28/2022 1508   RBC 4.97 03/28/2022 1508   HGB 15.0 03/28/2022 1508   HGB 13.3 02/03/2020 0837   HGB 13.6 12/04/2018 0914   HGB 12.9 04/22/2016 0750   HCT 46.8 (H) 03/28/2022 1508   HCT 41.0 12/04/2018 0914   HCT 39.0 04/22/2016 0750   PLT 318 03/28/2022 1508   PLT 270 02/03/2020 0837   PLT 332 12/04/2018 0914   MCV 94.2 03/28/2022 1508   MCV 88 12/04/2018 0914   MCV 92.2 04/22/2016 0750   MCH 30.2 03/28/2022 1508   MCHC 32.1 03/28/2022 1508   RDW 13.6 03/28/2022 1508   RDW 13.6 12/04/2018 0914   RDW 13.9 04/22/2016 0750   Iron Studies    Component Value Date/Time   IRON 43 08/18/2010 0515   TIBC 389 08/18/2010 0515   FERRITIN 227 08/18/2010 0515   IRONPCTSAT 11 (L) 08/18/2010 0515   Lipid Panel     Component Value Date/Time   CHOL 161 01/20/2023 1032   TRIG 183 (H) 01/20/2023 1032   HDL 46 01/20/2023 1032   CHOLHDL 3.5 01/20/2023 1032   CHOLHDL 5.1 (H) 08/14/2016 1729   VLDL 74 (H) 08/14/2016 1729   LDLCALC 84 01/20/2023 1032   Hepatic Function Panel     Component Value Date/Time   PROT 7.7 10/21/2022 1216   PROT 8.2 04/22/2016 0750   ALBUMIN 4.6 01/09/2022 0855   ALBUMIN 3.8 04/22/2016 0750   AST 26 01/09/2022 0855    AST 29 12/29/2019 1015   AST 27 04/22/2016 0750   ALT 29 01/09/2022 0855   ALT 33 12/29/2019 1015   ALT 24 04/22/2016 0750   ALKPHOS 74 01/09/2022 0855   ALKPHOS 74 04/22/2016 0750   BILITOT 0.5 01/09/2022 0855   BILITOT 0.6 12/29/2019 1015   BILITOT 0.44 04/22/2016 0750   BILIDIR 0.2 08/18/2010 0515  IBILI 1.5 (H) 08/18/2010 0515      Component Value Date/Time   TSH 1.370 06/21/2022 1616   Nutritional Lab Results  Component Value Date   VD25OH 33.96 04/23/2023   VD25OH 29.26 (L) 10/21/2022   VD25OH 31.2 06/21/2022    Attestations:   I, Isabelle Course, acting as a medical scribe for Thomasene Lot, DO., have compiled all relevant documentation for today's office visit on behalf of Thomasene Lot, DO, while in the presence of [employer], DO.  Reviewed by clinician on day of visit: allergies, medications, problem list, medical history, surgical history, family history, social history, and previous encounter notes pertinent to patient's obesity diagnosis.  I have reviewed the above documentation for accuracy and completeness, and I agree with the above. Sarah [name], D.O.  The 21st Century Cures Act was signed into law in 2016 which includes the topic of electronic health records.  This provides immediate access to information in MyChart.  This includes consultation notes, operative notes, office notes, lab results and pathology reports.  If you have any questions about what you read please let us know at your next visit so we can discuss your concerns and take corrective action if need be.  We are right here with you.

## 2023-07-24 ENCOUNTER — Other Ambulatory Visit: Payer: Self-pay | Admitting: Internal Medicine

## 2023-07-24 DIAGNOSIS — I10 Essential (primary) hypertension: Secondary | ICD-10-CM

## 2023-07-24 LAB — COMPREHENSIVE METABOLIC PANEL
ALT: 30 [IU]/L (ref 0–32)
AST: 30 [IU]/L (ref 0–40)
Albumin: 4.6 g/dL (ref 3.8–4.9)
Alkaline Phosphatase: 82 [IU]/L (ref 44–121)
BUN/Creatinine Ratio: 14 (ref 9–23)
BUN: 19 mg/dL (ref 6–24)
Bilirubin Total: 0.5 mg/dL (ref 0.0–1.2)
CO2: 23 mmol/L (ref 20–29)
Calcium: 11.4 mg/dL — ABNORMAL HIGH (ref 8.7–10.2)
Chloride: 106 mmol/L (ref 96–106)
Creatinine, Ser: 1.35 mg/dL — ABNORMAL HIGH (ref 0.57–1.00)
Globulin, Total: 2.7 g/dL (ref 1.5–4.5)
Glucose: 88 mg/dL (ref 70–99)
Potassium: 4.4 mmol/L (ref 3.5–5.2)
Sodium: 143 mmol/L (ref 134–144)
Total Protein: 7.3 g/dL (ref 6.0–8.5)
eGFR: 46 mL/min/{1.73_m2} — ABNORMAL LOW (ref >59)

## 2023-07-24 LAB — LIPID PANEL
Chol/HDL Ratio: 3.4 {ratio} (ref 0.0–4.4)
Cholesterol, Total: 161 mg/dL (ref 100–199)
HDL: 47 mg/dL (ref >39)
LDL Chol Calc (NIH): 86 mg/dL (ref 0–99)
Triglycerides: 162 mg/dL — ABNORMAL HIGH (ref 0–149)
VLDL Cholesterol Cal: 28 mg/dL (ref 5–40)

## 2023-07-24 LAB — HEMOGLOBIN A1C
Est. average glucose Bld gHb Est-mCnc: 126 mg/dL
Hgb A1c MFr Bld: 6 % — ABNORMAL HIGH (ref 4.8–5.6)

## 2023-07-24 LAB — VITAMIN B12: Vitamin B-12: 1604 pg/mL — ABNORMAL HIGH (ref 232–1245)

## 2023-07-24 NOTE — Telephone Encounter (Signed)
Requested by interface surescripts. Last labs 07/23/23 future visit in 2 months.  Requested Prescriptions  Pending Prescriptions Disp Refills   losartan (COZAAR) 50 MG tablet [Pharmacy Med Name: Losartan Potassium Oral Tablet 50 MG] 90 tablet 0    Sig: TAKE 1 TABLET EVERY DAY     Cardiovascular:  Angiotensin Receptor Blockers Failed - 07/24/2023  9:46 AM      Failed - Cr in normal range and within 180 days    Creatinine  Date Value Ref Range Status  12/29/2019 1.26 (H) 0.44 - 1.00 mg/dL Final  16/03/9603 1.1 0.6 - 1.1 mg/dL Final   Creatinine, Ser  Date Value Ref Range Status  07/23/2023 1.35 (H) 0.57 - 1.00 mg/dL Final   Creatinine, POC  Date Value Ref Range Status  11/13/2016 100 mg/dL Final   Creatinine, Urine  Date Value Ref Range Status  07/27/2015 174 20 - 320 mg/dL Final         Passed - K in normal range and within 180 days    Potassium  Date Value Ref Range Status  07/23/2023 4.4 3.5 - 5.2 mmol/L Final  04/22/2016 3.9 3.5 - 5.1 mEq/L Final         Passed - Patient is not pregnant      Passed - Last BP in normal range    BP Readings from Last 1 Encounters:  07/23/23 131/75         Passed - Valid encounter within last 6 months    Recent Outpatient Visits           2 months ago Type 2 diabetes mellitus with obesity (HCC)   [place] [facility] Dept Of [place]. [facility] Jonah Blue B, MD   6 months ago Type 2 diabetes mellitus with obesity [facility])   [place] [facility] - A Dept Of [place]. [facility] Jonah Blue B, MD   10 months ago Type 2 diabetes mellitus with obesity [facility])   [place] [facility] - A Dept Of [place]. [facility] Marcine Matar, MD   1 year ago Chronic bilateral thoracic back pain   [place] [facility] - A Dept Of [place]. [facility] Marcine Matar, MD   1 year ago Type 2 diabetes mellitus with obesity [facility])   [place] [facility] - A Dept Of [place]. [facility] Marcine Matar, MD       Future Appointments             In 2 months Laural Benes Binnie Rail, MD [facility] - A Dept Of [place] Bridegroom. [facility]             ACCU-CHEK GUIDE TEST test strip [Pharmacy Med Name: Accu-Chek Guide Test In Vitro Strip] 300 strip 3    Sig: USE TO CHECK BLOOD SUGAR 3 TIMES DAILY.     There is no refill protocol information for this order

## 2023-07-29 ENCOUNTER — Other Ambulatory Visit (INDEPENDENT_AMBULATORY_CARE_PROVIDER_SITE_OTHER): Payer: Self-pay | Admitting: Family Medicine

## 2023-07-29 ENCOUNTER — Other Ambulatory Visit: Payer: Self-pay | Admitting: Internal Medicine

## 2023-07-29 DIAGNOSIS — F39 Unspecified mood [affective] disorder: Secondary | ICD-10-CM

## 2023-07-29 NOTE — Telephone Encounter (Signed)
Requested Prescriptions  Pending Prescriptions Disp Refills   Continuous Glucose Receiver (FREESTYLE LIBRE 3 READER) DEVI [Pharmacy Med Name: FreeStyle Libre 3 Reader Device] 1 each 2    Sig: USE AS DIRECTED     Endocrinology: Diabetes - Testing Supplies Passed - 07/29/2023 11:52 AM      Passed - Valid encounter within last 12 months    Recent Outpatient Visits           2 months ago Type 2 diabetes mellitus with obesity (HCC)   [place] [facility] - [facility] Dept Of [place]. [facility] Jonah Blue B, MD   6 months ago Type 2 diabetes mellitus with obesity [facility])   [place] [facility] - A Dept Of [place]. [facility] Jonah Blue B, MD   10 months ago Type 2 diabetes mellitus with obesity [facility])   [place] [facility] - A Dept Of [place]. [facility] Marcine Matar, MD   1 year ago Chronic bilateral thoracic back pain   [place] [facility] - A Dept Of [place]. [facility] Marcine Matar, MD   1 year ago Type 2 diabetes mellitus with obesity [facility])    [facility] - A Dept Of [place]. [facility] Marcine Matar, MD       Future Appointments             In 1 month Laural Benes Binnie Rail, MD [facility] - A Dept Of [place] Bridegroom. [facility]

## 2023-08-06 ENCOUNTER — Ambulatory Visit (INDEPENDENT_AMBULATORY_CARE_PROVIDER_SITE_OTHER): Payer: Medicare HMO | Admitting: Family Medicine

## 2023-08-06 ENCOUNTER — Encounter (INDEPENDENT_AMBULATORY_CARE_PROVIDER_SITE_OTHER): Payer: Self-pay | Admitting: Family Medicine

## 2023-08-06 VITALS — BP 120/79 | HR 70 | Temp 97.1°F | Ht 65.0 in | Wt 200.0 lb

## 2023-08-06 DIAGNOSIS — E538 Deficiency of other specified B group vitamins: Secondary | ICD-10-CM | POA: Diagnosis not present

## 2023-08-06 DIAGNOSIS — I152 Hypertension secondary to endocrine disorders: Secondary | ICD-10-CM | POA: Diagnosis not present

## 2023-08-06 DIAGNOSIS — E66812 Obesity, class 2: Secondary | ICD-10-CM

## 2023-08-06 DIAGNOSIS — E782 Mixed hyperlipidemia: Secondary | ICD-10-CM

## 2023-08-06 DIAGNOSIS — E1159 Type 2 diabetes mellitus with other circulatory complications: Secondary | ICD-10-CM | POA: Diagnosis not present

## 2023-08-06 DIAGNOSIS — Z6833 Body mass index (BMI) 33.0-33.9, adult: Secondary | ICD-10-CM

## 2023-08-06 DIAGNOSIS — F39 Unspecified mood [affective] disorder: Secondary | ICD-10-CM | POA: Diagnosis not present

## 2023-08-06 DIAGNOSIS — Z7985 Long-term (current) use of injectable non-insulin antidiabetic drugs: Secondary | ICD-10-CM

## 2023-08-06 DIAGNOSIS — E1169 Type 2 diabetes mellitus with other specified complication: Secondary | ICD-10-CM | POA: Diagnosis not present

## 2023-08-06 MED ORDER — BUPROPION HCL ER (SR) 200 MG PO TB12: 200.0000 mg | ORAL_TABLET | Freq: Two times a day (BID) | ORAL | 0 refills | Status: DC

## 2023-08-06 MED ORDER — CYANOCOBALAMIN 500 MCG PO TABS: ORAL_TABLET | ORAL | Status: AC

## 2023-08-06 NOTE — Progress Notes (Signed)
 Sarah [name], D.O.  ABFM, ABOM Specializing in Clinical Bariatric Medicine  Office located at: [address] [place], [place]  [address]   Assessment and Plan:   Medications Discontinued During This Encounter  Medication Reason   vitamin B-12 (CYANOCOBALAMIN) 500 MCG tablet Reorder   buPROPion (WELLBUTRIN SR) 200 MG 12 hr tablet Reorder     Meds ordered this encounter  Medications   buPROPion (WELLBUTRIN SR) 200 MG 12 hr tablet    Sig: Take 1 tablet (200 mg total) by mouth 2 (two) times daily.    Dispense:  60 tablet    Refill:  0   cyanocobalamin (VITAMIN B12) 500 MCG tablet    Sig: 500 mcg every other day     FOR THE DISEASE OF OBESITY: Morbid obesity (HCC)-starting bmi 37.94 Obesity, current BMI 33.28 Assessment & Plan: Since last office visit on 07/23/2023 patient's  Muscle mass has increased by 0.2 lbs. Fat mass has decreased by 0.4 lbs. Total body water has increased by 4 lbs.  Counseling done on how various foods will affect these numbers and how to maximize success  Total lbs lost to date: 28 lbs Total weight loss percentage to date: 12.28%    Recommended Dietary Goals Sarah [name] is currently in the action stage of change. As such, her goal is to continue weight management plan.  She has agreed to: continue current plan   Behavioral Intervention We discussed the following today: increasing lower glycemic fruits, increasing water intake , and continue to practice mindfulness when eating  Additional resources provided today: None  Evidence-based interventions for health behavior change were utilized today including the discussion of self monitoring techniques, problem-solving barriers and SMART goal setting techniques.   Regarding patient's less desirable eating habits and patterns, we employed the technique of small changes.   Pt will specifically work on: Increase water intake for next visit.    Recommended Physical Activity Goals Sarah [name] has been  advised to work up to 150 minutes of moderate intensity aerobic activity a week and strengthening exercises 2-3 times per week for cardiovascular health, weight loss maintenance and preservation of muscle mass.   She has agreed to : Increase exercise to 5 days a week Think about enjoyable ways to increase daily physical activity and overcoming barriers to exercise     FOR ASSOCIATED CONDITIONS ADDRESSED TODAY:  Type 2 diabetes mellitus with obesity (HCC) Assessment & Plan: Lab Results  Component Value Date   HGBA1C 6.0 (H) 07/23/2023   HGBA1C 6.4 04/23/2023   HGBA1C 6.3 01/20/2023  Reviewed last obtained labs with pt, A1c levels improved from 6.4 to 6.0. Pt reports checking her blood sugars at home with an average of 109. She had one hypoglycemic episode of 54. She endorses feeling more drowsy and drank orange juice to bring blood sugar levels back up. Reminded pt that protein can keep blood sugars up. Sarah [name] will continue to work on weight loss, exercise, via their meal plan we devised to continue decreasing A1c levels. We will recheck A1c and fasting insulin in 3 months from last check.    Mixed diabetic hyperlipidemia associated with type 2 diabetes mellitus Sarah [name]) Assessment & Plan: Lab Results  Component Value Date   CHOL 161 07/23/2023   HDL 47 07/23/2023   LDLCALC 86 07/23/2023   TRIG 162 (H) 07/23/2023   CHOLHDL 3.4 07/23/2023   Lab Results  Component Value Date   CREATININE 1.35 (H) 07/23/2023   BUN 19 07/23/2023  NA 143 07/23/2023   K 4.4 07/23/2023   CL 106 07/23/2023   CO2 23 07/23/2023     Component Value Date/Time   PROT 7.3 07/23/2023 0925   ALBUMIN 4.6 07/23/2023 0925   AST 30 07/23/2023 0925   ALT 30 07/23/2023 0925   ALKPHOS 82 07/23/2023 0925   BILITOT 0.5 07/23/2023 0925   BILIDIR 0.2 08/18/2010 0515   IBILI 1.5 (H) 08/18/2010 0515  Sarah [name] is taking Crestor 20 mg once daily for HLD. LDL is slightly above goal at 86, should be less than 70 in a  diabetic. Triglycerides are also elevated but have improved from 183 to 162. Liver and kidney levels stable. Pt currently sees endocrinologist for kidneys. Encouraged pt to prioritize water intake and exercise to maintain stable levels. Avoid saturated/trans fats. Continue current medication regimen. Will continue to monitor alongside specialist.   Hypertension associated with diabetes [name]) Assessment & Plan: BP Readings from Last 3 Encounters:  08/06/23 120/79  07/23/23 131/75  07/09/23 118/78  Pt is treating HTN with Metoprolol 25 mg 1/2 tablet twice daily and Gabapentin 600 mg three times daily. BP is at goal today. Pt states she has not talked to PCP about weaning Metoprolol and Gabapentin due to being obesogenic. Will discuss with Dr. Laural Benes in the future   Depressed mood [name]) with emotional eating Assessment & Plan: Sarah [name] is managing this condition with Wellbutrin SR 200 mg twice daily. Pt denies any GI upset. Hunger/cravings well controlled. She reports a member of her church unfortunately passed away. Although it has been hard, her mood has been stable per pt. Reminded Sarah [name] to prioritize self-care during tough times. Continue current medication regimen. Will monitor closely.  Relevant Orders: -     buPROPion HCl ER (SR); Take 1 tablet (200 mg total) by mouth 2 (two) times daily.  Dispense: 60 tablet; Refill: 0   B12 deficiency Assessment & Plan: Lab Results  Component Value Date   VITAMINB12 1,604 (H) 07/23/2023  Pt is treating with Cyanocobalamin 500 mcg daily. Per last obtained labs, B12 levels are a bit above goal. Start taking B12 supplement every other day d/t elevated levels. Will recheck levels in 3 months to observe improvement.  Relevant Orders: -     Cyanocobalamin; 500 mcg every other day    Follow up:   Return in about 3 weeks (around 08/27/2023). She was informed of the importance of frequent follow up visits to maximize her success with intensive lifestyle  modifications for her multiple health conditions.    Subjective:   Chief complaint: Obesity Sarah [name] is here to discuss her progress with her obesity treatment plan. She is on the the Category 3 Plan and states she is following her eating plan approximately 70% of the time. She states she is doing Silver Sneakers 50 minutes 3 days per week.  Interval History:  Sarah [name] is here for a follow up office visit. Since last OV on 07/23/2023,  Sarah [name]'s weight is changed. She unfortunately reports somebody in her church passed away. She has been doing Solicitor as a coping mechanism. Additionally, Sarah [name] has been following her MP well.  Pharmacotherapy for weight loss: She is currently taking Wellbutrin SR 200 mg twice daily, Metformin 500 mg daily, and Mounjaro 15 mg once weekly.  Review of Systems:  Pertinent positives were addressed with patient today.  Reviewed by clinician on day of visit: allergies, medications, problem list, medical history, surgical history, family history, social history, and previous encounter notes.  Weight Summary and Biometrics   Weight Lost Since Last Visit: 0 lb  Weight Gained Since Last Visit: 0 lb   Vitals Temp: (!) 97.1 F (36.2 C) BP: 120/79 Pulse Rate: 70 SpO2: 98 %   Anthropometric Measurements Height: 5\' 5"  (1.651 m) Weight: 200 lb (90.7 kg) BMI (Calculated): 33.28 Weight at Last Visit: 200 lb Weight Lost Since Last Visit: 0 lb Weight Gained Since Last Visit: 0 lb Starting Weight: 228 lb Total Weight Loss (lbs): 28 lb (12.7 kg) Peak Weight: 228 lb   Body Composition  Body Fat %: 39.7 % Fat Mass (lbs): 79.8 lbs Muscle Mass (lbs): 114.8 lbs Total Body Water (lbs): 77.6 lbs Visceral Fat Rating : 11   No data recorded  Objective:   PHYSICAL EXAM: Blood pressure 120/79, pulse 70, temperature (!) 97.1 F (36.2 C), height 5\' 5"  (1.651 m), weight 200 lb (90.7 kg), SpO2 98%. Body mass index is 33.28 kg/m.  General: she  is overweight, cooperative and in no acute distress. PSYCH: Has normal mood, affect and thought process.   HEENT: EOMI, sclerae are anicteric. Lungs: Normal breathing effort, no conversational dyspnea. Extremities: Moves * 4 Neurologic: A and O * 3, good insight  DIAGNOSTIC DATA REVIEWED: BMET    Component Value Date/Time   NA 143 07/23/2023 0925   NA 138 04/22/2016 0750   K 4.4 07/23/2023 0925   K 3.9 04/22/2016 0750   CL 106 07/23/2023 0925   CO2 23 07/23/2023 0925   CO2 26 04/22/2016 0750   GLUCOSE 88 07/23/2023 0925   GLUCOSE 81 03/28/2022 1508   GLUCOSE 142 (H) 04/22/2016 0750   BUN 19 07/23/2023 0925   BUN 19.1 11/20/2016 1355   CREATININE 1.35 (H) 07/23/2023 0925   CREATININE 1.26 (H) 12/29/2019 1015   CREATININE 1.1 11/20/2016 1355   CALCIUM 11.4 (H) 07/23/2023 0925   CALCIUM 9.8 04/22/2016 0750   GFRNONAA 47 (L) 03/28/2022 1508   GFRNONAA 48 (L) 12/29/2019 1015   GFRNONAA 63 07/08/2014 1212   GFRAA 69 04/17/2020 0928   GFRAA 56 (L) 12/29/2019 1015   GFRAA 73 07/08/2014 1212   Lab Results  Component Value Date   HGBA1C 6.0 (H) 07/23/2023   HGBA1C 10.1 (H) 07/07/2015   No results found for: "INSULIN" Lab Results  Component Value Date   TSH 1.370 06/21/2022   CBC    Component Value Date/Time   WBC 5.4 03/28/2022 1508   RBC 4.97 03/28/2022 1508   HGB 15.0 03/28/2022 1508   HGB 13.3 02/03/2020 0837   HGB 13.6 12/04/2018 0914   HGB 12.9 04/22/2016 0750   HCT 46.8 (H) 03/28/2022 1508   HCT 41.0 12/04/2018 0914   HCT 39.0 04/22/2016 0750   PLT 318 03/28/2022 1508   PLT 270 02/03/2020 0837   PLT 332 12/04/2018 0914   MCV 94.2 03/28/2022 1508   MCV 88 12/04/2018 0914   MCV 92.2 04/22/2016 0750   MCH 30.2 03/28/2022 1508   MCHC 32.1 03/28/2022 1508   RDW 13.6 03/28/2022 1508   RDW 13.6 12/04/2018 0914   RDW 13.9 04/22/2016 0750   Iron Studies    Component Value Date/Time   IRON 43 08/18/2010 0515   TIBC 389 08/18/2010 0515   FERRITIN 227  08/18/2010 0515   IRONPCTSAT 11 (L) 08/18/2010 0515   Lipid Panel     Component Value Date/Time   CHOL 161 07/23/2023 0925   TRIG 162 (H) 07/23/2023 0925   HDL 47 07/23/2023 0925  CHOLHDL 3.4 07/23/2023 0925   CHOLHDL 5.1 (H) 08/14/2016 1729   VLDL 74 (H) 08/14/2016 1729   LDLCALC 86 07/23/2023 0925   Hepatic Function Panel     Component Value Date/Time   PROT 7.3 07/23/2023 0925   PROT 8.2 04/22/2016 0750   ALBUMIN 4.6 07/23/2023 0925   ALBUMIN 3.8 04/22/2016 0750   AST 30 07/23/2023 0925   AST 29 12/29/2019 1015   AST 27 04/22/2016 0750   ALT 30 07/23/2023 0925   ALT 33 12/29/2019 1015   ALT 24 04/22/2016 0750   ALKPHOS 82 07/23/2023 0925   ALKPHOS 74 04/22/2016 0750   BILITOT 0.5 07/23/2023 0925   BILITOT 0.6 12/29/2019 1015   BILITOT 0.44 04/22/2016 0750   BILIDIR 0.2 08/18/2010 0515   IBILI 1.5 (H) 08/18/2010 0515      Component Value Date/Time   TSH 1.370 06/21/2022 1616   Nutritional Lab Results  Component Value Date   VD25OH 33.96 04/23/2023   VD25OH 29.26 (L) 10/21/2022   VD25OH 31.2 06/21/2022    Attestations:   I, Camryn Mix, acting as a Stage manager for [employer], DO., have compiled all relevant documentation for today's office visit on behalf of Thomasene Lot, DO, while in the presence of [employer], DO.  Reviewed by clinician on day of visit: allergies, medications, problem list, medical history, surgical history, family history, social history, and previous encounter notes pertinent to patient's obesity diagnosis. I have spent in the care of the patient today including: preparing to see patient (e.g. review and interpretation of tests, old notes ), obtaining and/or reviewing separately obtained history, performing a medically appropriate examination or evaluation, counseling and educating the patient, ordering medications, test or procedures, documenting clinical information in the electronic or other health care record,  and independently interpreting results and communicating results to the patient, family, or caregiver   I have reviewed the above documentation for accuracy and completeness, and I agree with the above. Sarah [name], D.O.  The 21st Century Cures Act was signed into law in 2016 which includes the topic of electronic health records.  This provides immediate access to information in MyChart.  This includes consultation notes, operative notes, office notes, lab results and pathology reports.  If you have any questions about what you read please let us know at your next visit so we can discuss your concerns and take corrective action if need be.  We are right here with you.

## 2023-08-08 ENCOUNTER — Encounter: Payer: Self-pay | Admitting: Internal Medicine

## 2023-08-08 ENCOUNTER — Other Ambulatory Visit (HOSPITAL_COMMUNITY): Payer: Self-pay

## 2023-08-08 MED ORDER — IBUPROFEN 600 MG PO TABS
600.0000 mg | ORAL_TABLET | Freq: Four times a day (QID) | ORAL | 0 refills | Status: DC | PRN
  Filled 2023-08-08: qty 30, 8d supply, fill #0

## 2023-08-08 MED ORDER — AMOXICILLIN 500 MG PO CAPS
500.0000 mg | ORAL_CAPSULE | Freq: Three times a day (TID) | ORAL | 0 refills | Status: DC
Start: 2023-08-08 — End: 2023-10-09
  Filled 2023-08-08: qty 21, 7d supply, fill #0

## 2023-08-27 ENCOUNTER — Other Ambulatory Visit (INDEPENDENT_AMBULATORY_CARE_PROVIDER_SITE_OTHER): Payer: Self-pay | Admitting: Family Medicine

## 2023-08-27 ENCOUNTER — Other Ambulatory Visit (HOSPITAL_COMMUNITY): Payer: Self-pay

## 2023-08-27 ENCOUNTER — Ambulatory Visit (INDEPENDENT_AMBULATORY_CARE_PROVIDER_SITE_OTHER): Payer: Medicare HMO | Admitting: Family Medicine

## 2023-08-27 ENCOUNTER — Encounter (INDEPENDENT_AMBULATORY_CARE_PROVIDER_SITE_OTHER): Payer: Self-pay | Admitting: Family Medicine

## 2023-08-27 VITALS — BP 125/76 | HR 64 | Temp 97.9°F | Ht 65.0 in | Wt 202.0 lb

## 2023-08-27 DIAGNOSIS — F5089 Other specified eating disorder: Secondary | ICD-10-CM

## 2023-08-27 DIAGNOSIS — I152 Hypertension secondary to endocrine disorders: Secondary | ICD-10-CM | POA: Diagnosis not present

## 2023-08-27 DIAGNOSIS — Z9109 Other allergy status, other than to drugs and biological substances: Secondary | ICD-10-CM

## 2023-08-27 DIAGNOSIS — E1159 Type 2 diabetes mellitus with other circulatory complications: Secondary | ICD-10-CM | POA: Diagnosis not present

## 2023-08-27 DIAGNOSIS — E1169 Type 2 diabetes mellitus with other specified complication: Secondary | ICD-10-CM

## 2023-08-27 DIAGNOSIS — Z7985 Long-term (current) use of injectable non-insulin antidiabetic drugs: Secondary | ICD-10-CM

## 2023-08-27 DIAGNOSIS — E538 Deficiency of other specified B group vitamins: Secondary | ICD-10-CM

## 2023-08-27 DIAGNOSIS — Z6833 Body mass index (BMI) 33.0-33.9, adult: Secondary | ICD-10-CM | POA: Diagnosis not present

## 2023-08-27 DIAGNOSIS — F39 Unspecified mood [affective] disorder: Secondary | ICD-10-CM | POA: Diagnosis not present

## 2023-08-27 MED ORDER — BUPROPION HCL ER (SR) 200 MG PO TB12: 200.0000 mg | ORAL_TABLET | Freq: Two times a day (BID) | ORAL | 0 refills | Status: DC

## 2023-08-27 MED ORDER — TIRZEPATIDE 15 MG/0.5ML ~~LOC~~ SOAJ
15.0000 mg | SUBCUTANEOUS | 0 refills | Status: DC
  Filled 2023-08-27: qty 2, 28d supply, fill #0

## 2023-08-27 MED ORDER — LEVOCETIRIZINE DIHYDROCHLORIDE 5 MG PO TABS: 5.0000 mg | ORAL_TABLET | Freq: Every evening | ORAL | 0 refills | Status: DC

## 2023-08-27 NOTE — Progress Notes (Signed)
 [name], D.O.  ABFM, ABOM Specializing in Clinical Bariatric Medicine  Office located at: [address] [place], [place]  [address]   Assessment and Plan:   No orders of the defined types were placed in this encounter.   Medications Discontinued During This Encounter  Medication Reason   levocetirizine (XYZAL) 5 MG tablet Reorder   tirzepatide (MOUNJARO) 15 MG/0.5ML Pen Reorder   buPROPion (WELLBUTRIN SR) 200 MG 12 hr tablet Reorder     Meds ordered this encounter  Medications   tirzepatide (MOUNJARO) 15 MG/0.5ML Pen    Sig: Inject 15 mg into the skin once a week every Thursday    Dispense:  2 mL    Refill:  0   buPROPion (WELLBUTRIN SR) 200 MG 12 hr tablet    Sig: Take 1 tablet (200 mg total) by mouth 2 (two) times daily.    Dispense:  60 tablet    Refill:  0   levocetirizine (XYZAL) 5 MG tablet    Sig: Take 1 tablet (5 mg total) by mouth every evening.    Dispense:  90 tablet    Refill:  0      FOR THE DISEASE OF OBESITY:  Obesity, current BMI 33.61 Morbid obesity-starting bmi 37.94 Assessment & Plan: Since last office visit on 08/06/23, patient's muscle mass has decreased by 1 lb. Fat mass has increased by 1.8 lbs. Total body water has decreased by 1.4 lbs.  Counseling done on how various foods will affect these numbers and how to maximize success  Total lbs lost to date: 26 lbs Total weight loss percentage to date: 11.40%    Recommended Dietary Goals Sarah [name] is currently in the action stage of change. As such, her goal is to continue weight management plan.  She has agreed to: Journal 1300-1400 cal 85+g protein using Category 2 MP as a guide   Behavioral Intervention We discussed the following today: [email or web address], [place] spaghetti, avoiding skipping meals, [employer]  Additional resources provided today: handout on CAT 2 meal plan and Handout on CAT 1-2 breakfast options  Evidence-based interventions for health behavior change  were utilized today including the discussion of self monitoring techniques, problem-solving barriers and SMART goal setting techniques.   Regarding patient's less desirable eating habits and patterns, we employed the technique of small changes.   Pt will specifically work on: Stick to [employer] and don't skip meals for next visit.    Recommended Physical Activity Goals Sarah [name] has been advised to work up to 150 minutes of moderate intensity aerobic activity a week and strengthening exercises 2-3 times per week for cardiovascular health, weight loss maintenance and preservation of muscle mass.   She has agreed to :  Continue current level of physical activity    Pharmacotherapy We both agreed to : continue with nutritional and behavioral strategies and current medication regimen   FOR ASSOCIATED CONDITIONS ADDRESSED TODAY:  Type 2 diabetes mellitus with obesity (HCC) Assessment & Plan: Sarah [name] is on Metformin 500 mg daily and Mounjaro 15 mg once weekly. She reports checking her blood sugars at home -- averaging 110, with lows of 58. Pt denies any hypoglycemic symptoms during low episodes, they happen mostly while she is asleep. Encouraged pt to ensure she is getting in adequate protein at dinner to decrease chance of hypoglycemic episodes at night. Closely adhere to prudent nutrition plan. Will continue to monitor.   Relevant Orders: -     Tirzepatide; Inject 15 mg into the  skin once a week every Thursday  Dispense: 2 mL; Refill: 0   Depressed mood with emotional eating Assessment & Plan: Sarah [name] is on Wellbutrin SR 200 mg once daily. Medication at this dose is working well, hunger/cravings well controlled. Mood is stable. Pt was prescribed twice daily, but reports often forgetting to take the second dose. Recommended to pt if current dose works well to continue once a day. Reminded patient of the importance of following their prudent nutrition plan and how food can affect mood as well to support  emotional wellbeing. We will continue to monitor closely alongside PCP.  Relevant Orders: -     buPROPion HCl ER (SR); Take 1 tablet (200 mg total) by mouth 2 (two) times daily.  Dispense: 60 tablet; Refill: 0   Hypertension associated with diabetes Assessment & Plan: Sarah [name] is on Metoprolol 25 mg 1/2 tablet twice daily and Gabapentin 600 mg three times daily. BP is stable today at 125/76. She consulted with PCP about decreasing antihypertensives d/t their obesogenic nature. Pt will discuss with Dr. Laural Benes at next OV on 04/14. Continue low sodium RCNP.    B12 deficiency Assessment & Plan: Sarah [name] is taking Cyanocobalamin 500 mcg daily. Pt reports forgetting about dose change during LOV to once every other day d/t elevated B12 levels. Encouraged pt to decrease dose to once every other day to decrease chance of excess B12. Will continue to monitor condition.    Environmental allergies Assessment & Plan: Sarah [name] is taking Xyzal 5 mg once daily and Singulair 10 mg once daily for her allergies. Medication is relieving symptoms effectively. No acute concerns in this regard. Will refill Xyzal today.   Relevant Orders: -     Levocetirizine Dihydrochloride; Take 1 tablet (5 mg total) by mouth every evening.  Dispense: 90 tablet; Refill: 0  Follow up:   Return in about 2 weeks (around 09/10/2023). She was informed of the importance of frequent follow up visits to maximize her success with intensive lifestyle modifications for her multiple health conditions.  Subjective:   Chief complaint: Obesity Sarah [name] is here to discuss her progress with her obesity treatment plan. She is on the the Category 3 Plan and states she is following her eating plan approximately 60% of the time. She states she is walking 45 mins 2 days per week and doing silver sneakers 50 minutes 3 days per week.  Interval History:  Sarah [name] is here for a follow up office visit. Since last OV on 08/06/2023, Sarah [name] is down 2  lbs. She admits to recently skipping meals, however is eating out a [name] less than she used to. Pt reports increasing her water intake, but still being a bit under the goal. She has been walking with her sister to increase physical activity, as well as journaling her meal intake. Additionally, pt was ill last week so she did not eat much.  Pharmacotherapy for weight loss: She is currently taking Wellbutrin SR 200 mg twice daily, Metformin 500 mg once daily, and Mounjaro 15 mg once weekly.   Review of Systems:  Pertinent positives were addressed with patient today.  Reviewed by clinician on day of visit: allergies, medications, problem list, medical history, surgical history, family history, social history, and previous encounter notes.  Weight Summary and Biometrics   Weight Lost Since Last Visit: 0  Weight Gained Since Last Visit: 2 lb   Vitals Temp: 97.9 F (36.6 C) BP: 125/76 Pulse Rate: 64 SpO2: 90 %   Anthropometric Measurements Height:  5\' 5"  (1.651 m) Weight: 202 lb (91.6 kg) BMI (Calculated): 33.61 Weight at Last Visit: 200 lb Weight Lost Since Last Visit: 0 Weight Gained Since Last Visit: 2 lb Starting Weight: 228 lb Total Weight Loss (lbs): 26 lb (11.8 kg) Peak Weight: 228 lb   Body Composition  Body Fat %: 40.8 % Fat Mass (lbs): 82.6 lbs Muscle Mass (lbs): 113.8 lbs Total Body Water (lbs): 76.2 lbs Visceral Fat Rating : 11   Other Clinical Data Fasting: No Labs: No Today's Visit #: 79 Starting Date: 02/15/20    Objective:   PHYSICAL EXAM: Blood pressure 125/76, pulse 64, temperature 97.9 F (36.6 C), height 5\' 5"  (1.651 m), weight 202 lb (91.6 kg), SpO2 90%. Body mass index is 33.61 kg/m.  General: she is overweight, cooperative and in no acute distress. PSYCH: Has normal mood, affect and thought process.   HEENT: EOMI, sclerae are anicteric. Lungs: Normal breathing effort, no conversational dyspnea. Extremities: Moves * 4 Neurologic: A and O  * 3, good insight  DIAGNOSTIC DATA REVIEWED: BMET    Component Value Date/Time   NA 143 07/23/2023 0925   NA 138 04/22/2016 0750   K 4.4 07/23/2023 0925   K 3.9 04/22/2016 0750   CL 106 07/23/2023 0925   CO2 23 07/23/2023 0925   CO2 26 04/22/2016 0750   GLUCOSE 88 07/23/2023 0925   GLUCOSE 81 03/28/2022 1508   GLUCOSE 142 (H) 04/22/2016 0750   BUN 19 07/23/2023 0925   BUN 19.1 11/20/2016 1355   CREATININE 1.35 (H) 07/23/2023 0925   CREATININE 1.26 (H) 12/29/2019 1015   CREATININE 1.1 11/20/2016 1355   CALCIUM 11.4 (H) 07/23/2023 0925   CALCIUM 9.8 04/22/2016 0750   GFRNONAA 47 (L) 03/28/2022 1508   GFRNONAA 48 (L) 12/29/2019 1015   GFRNONAA 63 07/08/2014 1212   GFRAA 69 04/17/2020 0928   GFRAA 56 (L) 12/29/2019 1015   GFRAA 73 07/08/2014 1212   Lab Results  Component Value Date   HGBA1C 6.0 (H) 07/23/2023   HGBA1C 10.1 (H) 07/07/2015   No results found for: "INSULIN" Lab Results  Component Value Date   TSH 1.370 06/21/2022   CBC    Component Value Date/Time   WBC 5.4 03/28/2022 1508   RBC 4.97 03/28/2022 1508   HGB 15.0 03/28/2022 1508   HGB 13.3 02/03/2020 0837   HGB 13.6 12/04/2018 0914   HGB 12.9 04/22/2016 0750   HCT 46.8 (H) 03/28/2022 1508   HCT 41.0 12/04/2018 0914   HCT 39.0 04/22/2016 0750   PLT 318 03/28/2022 1508   PLT 270 02/03/2020 0837   PLT 332 12/04/2018 0914   MCV 94.2 03/28/2022 1508   MCV 88 12/04/2018 0914   MCV 92.2 04/22/2016 0750   MCH 30.2 03/28/2022 1508   MCHC 32.1 03/28/2022 1508   RDW 13.6 03/28/2022 1508   RDW 13.6 12/04/2018 0914   RDW 13.9 04/22/2016 0750   Iron Studies    Component Value Date/Time   IRON 43 08/18/2010 0515   TIBC 389 08/18/2010 0515   FERRITIN 227 08/18/2010 0515   IRONPCTSAT 11 (L) 08/18/2010 0515   Lipid Panel     Component Value Date/Time   CHOL 161 07/23/2023 0925   TRIG 162 (H) 07/23/2023 0925   HDL 47 07/23/2023 0925   CHOLHDL 3.4 07/23/2023 0925   CHOLHDL 5.1 (H) 08/14/2016 1729    VLDL 74 (H) 08/14/2016 1729   LDLCALC 86 07/23/2023 0925   Hepatic Function Panel  Component Value Date/Time   PROT 7.3 07/23/2023 0925   PROT 8.2 04/22/2016 0750   ALBUMIN 4.6 07/23/2023 0925   ALBUMIN 3.8 04/22/2016 0750   AST 30 07/23/2023 0925   AST 29 12/29/2019 1015   AST 27 04/22/2016 0750   ALT 30 07/23/2023 0925   ALT 33 12/29/2019 1015   ALT 24 04/22/2016 0750   ALKPHOS 82 07/23/2023 0925   ALKPHOS 74 04/22/2016 0750   BILITOT 0.5 07/23/2023 0925   BILITOT 0.6 12/29/2019 1015   BILITOT 0.44 04/22/2016 0750   BILIDIR 0.2 08/18/2010 0515   IBILI 1.5 (H) 08/18/2010 0515      Component Value Date/Time   TSH 1.370 06/21/2022 1616   Nutritional Lab Results  Component Value Date   VD25OH 33.96 04/23/2023   VD25OH 29.26 (L) 10/21/2022   VD25OH 31.2 06/21/2022    Attestations:   I, Camryn Mix, acting as a Stage manager for [employer], DO., have compiled all relevant documentation for today's office visit on behalf of Sarah Lot, DO, while in the presence of [employer], DO.  Reviewed by clinician on day of visit: allergies, medications, problem list, medical history, surgical history, family history, social history, and previous encounter notes pertinent to patient's obesity diagnosis. I have spent 40 minutes in the care of the patient today including: preparing to see patient (e.g. review and interpretation of tests, old notes ), obtaining and/or reviewing separately obtained history, performing a medically appropriate examination or evaluation, counseling and educating the patient, ordering medications, test or procedures, documenting clinical information in the electronic or other health care record, and independently interpreting results and communicating results to the patient, family, or caregiver   I have reviewed the above documentation for accuracy and completeness, and I agree with the above. [name], D.O.  The 21st Century Cures  Act was signed into law in 2016 which includes the topic of electronic health records.  This provides immediate access to information in MyChart.  This includes consultation notes, operative notes, office notes, lab results and pathology reports.  If you have any questions about what you read please let us know at your next visit so we can discuss your concerns and take corrective action if need be.  We are right here with you.

## 2023-08-28 ENCOUNTER — Other Ambulatory Visit (INDEPENDENT_AMBULATORY_CARE_PROVIDER_SITE_OTHER): Payer: Self-pay | Admitting: Family Medicine

## 2023-08-28 DIAGNOSIS — F39 Unspecified mood [affective] disorder: Secondary | ICD-10-CM

## 2023-09-05 ENCOUNTER — Other Ambulatory Visit (INDEPENDENT_AMBULATORY_CARE_PROVIDER_SITE_OTHER): Payer: Self-pay | Admitting: Family Medicine

## 2023-09-05 DIAGNOSIS — K219 Gastro-esophageal reflux disease without esophagitis: Secondary | ICD-10-CM

## 2023-09-10 ENCOUNTER — Ambulatory Visit (INDEPENDENT_AMBULATORY_CARE_PROVIDER_SITE_OTHER): Payer: Medicare HMO | Admitting: Family Medicine

## 2023-09-10 ENCOUNTER — Encounter (INDEPENDENT_AMBULATORY_CARE_PROVIDER_SITE_OTHER): Payer: Self-pay | Admitting: Family Medicine

## 2023-09-10 VITALS — BP 117/76 | HR 72 | Temp 97.7°F | Ht 65.0 in | Wt 203.0 lb

## 2023-09-10 DIAGNOSIS — Z6833 Body mass index (BMI) 33.0-33.9, adult: Secondary | ICD-10-CM | POA: Diagnosis not present

## 2023-09-10 DIAGNOSIS — E782 Mixed hyperlipidemia: Secondary | ICD-10-CM

## 2023-09-10 DIAGNOSIS — Z7985 Long-term (current) use of injectable non-insulin antidiabetic drugs: Secondary | ICD-10-CM | POA: Diagnosis not present

## 2023-09-10 DIAGNOSIS — E1169 Type 2 diabetes mellitus with other specified complication: Secondary | ICD-10-CM | POA: Diagnosis not present

## 2023-09-10 DIAGNOSIS — Z7984 Long term (current) use of oral hypoglycemic drugs: Secondary | ICD-10-CM | POA: Diagnosis not present

## 2023-09-10 MED ORDER — OMEGA-3-ACID ETHYL ESTERS 1 G PO CAPS
2.0000 g | ORAL_CAPSULE | Freq: Two times a day (BID) | ORAL | 0 refills | Status: DC
Start: 2023-09-10 — End: 2024-01-07

## 2023-09-10 NOTE — Progress Notes (Signed)
 Sarah [name], D.O.  ABFM, ABOM Specializing in Clinical Bariatric Medicine  Office located at: [address] [place], [place]  [address]   Assessment and Plan:  No orders of the defined types were placed in this encounter.   There are no discontinued medications.   Meds ordered this encounter  Medications   omega-3 acid ethyl esters (LOVAZA) 1 g capsule    Sig: Take 2 capsules (2 g total) by mouth 2 (two) times daily.    Dispense:  180 capsule    Refill:  0      FOR THE DISEASE OF OBESITY:  BMI 33.0-33.9,adult --  Current BMI 33.78 Morbid obesity-starting bmi 37.94 Assessment & Plan: Since last office visit on 08/27/2023, patient's muscle mass has decreased by 0.2 lbs. Fat mass has increased by 1.2 lbs. Total body water has increased by 2.4 lbs.  Counseling done on how various foods will affect these numbers and how to maximize success  Total lbs lost to date: 25 lbs Total weight loss percentage to date: 10.96%    Recommended Dietary Goals Sarah [name] is currently in the action stage of change. As such, her goal is to continue weight management plan.  She has agreed to: continue current plan   Behavioral Intervention We discussed the following today: increasing lean protein intake to established goals, avoiding skipping meals, avoiding temptations and identifying enticing environmental cues, and continue to work on implementation of reduced calorie nutritional plan  Additional resources provided today: Handout on Food Journaling Log and Handout on Common Characteristics of Successful Weight Losers,  Evidence-based interventions for health behavior change were utilized today including the discussion of self monitoring techniques, problem-solving barriers and SMART goal setting techniques.   Regarding patient's less desirable eating habits and patterns, we employed the technique of small changes.   Pt will specifically work on: Sticking to [employer] and not skipping meals  for next visit.    Recommended Physical Activity Goals Sarah [name] has been advised to work up to 150 minutes of moderate intensity aerobic activity a week and strengthening exercises 2-3 times per week for cardiovascular health, weight loss maintenance and preservation of muscle mass.   She has agreed to :  Continue current level of physical activity    Pharmacotherapy We both agreed to : continue with nutritional and behavioral strategies and current medication regimen   FOR ASSOCIATED CONDITIONS ADDRESSED TODAY:  Type 2 diabetes mellitus with obesity (HCC) Assessment & Plan: Sarah [name] is on Metformin 500 mg once daily and Mounjaro 15 mg once weekly. Pt is tolerating both medications well, denies any adverse SE. Hunger/cravings well controlled. She endorses using [employer]inn[employer].com to find recent recipes which has been beneficial. However, pt admits to occasionally skipping meals. Continue prioritizing high protein and low carbs in diet. Encouraged pt to avoid skipping meals to further promote weight loss. Continue current medication regimen. Will monitor condition closely.    Mixed diabetic hyperlipidemia associated with type 2 diabetes mellitus Sarah [name]) Assessment & Plan: Lab Results  Component Value Date   CHOL 161 07/23/2023   HDL 47 07/23/2023   LDLCALC 86 07/23/2023   TRIG 162 (H) 07/23/2023   CHOLHDL 3.4 07/23/2023  Pt is taking Crestor 20 mg once daily for her HLD. Per last obtained labs, triglyceride levels improved from 183 to 162 but were still elevated. Will start pt on Omega-3 supplements two tablets twice daily to help bring down triglycerides along with current medication. Pt is agreeable to starting new supplement. Continue low  cholesterol, heart healthy MP. Will continue to monitor.   Relevant Orders: -     Omega-3-acid Ethyl Esters; Take 2 capsules (2 g total) by mouth 2 (two) times daily.  Dispense: 180 capsule; Refill: 0  Follow up:   Return in about 15 days (around  09/25/2023). She was informed of the importance of frequent follow up visits to maximize her success with intensive lifestyle modifications for her multiple health conditions.  Subjective:   Chief complaint: Obesity Sarah [name] is here to discuss her progress with her obesity treatment plan. She is on the journaling 1300-1400 cal 85+g protein using Category 2 MP as a guide and states she is following her eating plan approximately 60% of the time. She states she is doing aerobics 50 minutes 2 days per week and walking 30 minutes 3 days per week.  Interval History:  Sarah [name] is here for a follow up office visit. Since last OV on 08/27/2023, Sarah [name] is up 1 lb. She was sick since LOV so she has not been eating much. Pt endorses minimal journaling and skipping meals.   Pharmacotherapy for weight loss: She is currently taking Wellbutrin SR 200 mg twice daily, Metformin 500 mg once daily, and Mounjaro 15 mg once weekly.   Review of Systems:  Pertinent positives were addressed with patient today.  Reviewed by clinician on day of visit: allergies, medications, problem list, medical history, surgical history, family history, social history, and previous encounter notes.  Weight Summary and Biometrics   Weight Lost Since Last Visit: 0  Weight Gained Since Last Visit: 1lb    Vitals Temp: 97.7 F (36.5 C) BP: 117/76 Pulse Rate: 72 SpO2: 98 %   Anthropometric Measurements Height: 5\' 5"  (1.651 m) Weight: 203 lb (92.1 kg) BMI (Calculated): 33.78 Weight at Last Visit: 202lb Weight Lost Since Last Visit: 0 Weight Gained Since Last Visit: 1lb Starting Weight: 228lb Total Weight Loss (lbs): 25 lb (11.3 kg) Peak Weight: 228lb   Body Composition  Body Fat %: 41.2 % Fat Mass (lbs): 83.8 lbs Muscle Mass (lbs): 113.6 lbs Total Body Water (lbs): 78.6 lbs Visceral Fat Rating : 11   Other Clinical Data Fasting: yes Labs: no Today's Visit #: 80 Starting Date: 02/15/20    Objective:    PHYSICAL EXAM: Blood pressure 117/76, pulse 72, temperature 97.7 F (36.5 C), height 5\' 5"  (1.651 m), weight 203 lb (92.1 kg), SpO2 98%. Body mass index is 33.78 kg/m.  General: she is overweight, cooperative and in no acute distress. PSYCH: Has normal mood, affect and thought process.   HEENT: EOMI, sclerae are anicteric. Lungs: Normal breathing effort, no conversational dyspnea. Extremities: Moves * 4 Neurologic: A and O * 3, good insight  DIAGNOSTIC DATA REVIEWED: BMET    Component Value Date/Time   NA 143 07/23/2023 0925   NA 138 04/22/2016 0750   K 4.4 07/23/2023 0925   K 3.9 04/22/2016 0750   CL 106 07/23/2023 0925   CO2 23 07/23/2023 0925   CO2 26 04/22/2016 0750   GLUCOSE 88 07/23/2023 0925   GLUCOSE 81 03/28/2022 1508   GLUCOSE 142 (H) 04/22/2016 0750   BUN 19 07/23/2023 0925   BUN 19.1 11/20/2016 1355   CREATININE 1.35 (H) 07/23/2023 0925   CREATININE 1.26 (H) 12/29/2019 1015   CREATININE 1.1 11/20/2016 1355   CALCIUM 11.4 (H) 07/23/2023 0925   CALCIUM 9.8 04/22/2016 0750   GFRNONAA 47 (L) 03/28/2022 1508   GFRNONAA 48 (L) 12/29/2019 1015   GFRNONAA 63  07/08/2014 1212   GFRAA 69 04/17/2020 0928   GFRAA 56 (L) 12/29/2019 1015   GFRAA 73 07/08/2014 1212   Lab Results  Component Value Date   HGBA1C 6.0 (H) 07/23/2023   HGBA1C 10.1 (H) 07/07/2015   No results found for: "INSULIN" Lab Results  Component Value Date   TSH 1.370 06/21/2022   CBC    Component Value Date/Time   WBC 5.4 03/28/2022 1508   RBC 4.97 03/28/2022 1508   HGB 15.0 03/28/2022 1508   HGB 13.3 02/03/2020 0837   HGB 13.6 12/04/2018 0914   HGB 12.9 04/22/2016 0750   HCT 46.8 (H) 03/28/2022 1508   HCT 41.0 12/04/2018 0914   HCT 39.0 04/22/2016 0750   PLT 318 03/28/2022 1508   PLT 270 02/03/2020 0837   PLT 332 12/04/2018 0914   MCV 94.2 03/28/2022 1508   MCV 88 12/04/2018 0914   MCV 92.2 04/22/2016 0750   MCH 30.2 03/28/2022 1508   MCHC 32.1 03/28/2022 1508   RDW 13.6  03/28/2022 1508   RDW 13.6 12/04/2018 0914   RDW 13.9 04/22/2016 0750   Iron Studies    Component Value Date/Time   IRON 43 08/18/2010 0515   TIBC 389 08/18/2010 0515   FERRITIN 227 08/18/2010 0515   IRONPCTSAT 11 (L) 08/18/2010 0515   Lipid Panel     Component Value Date/Time   CHOL 161 07/23/2023 0925   TRIG 162 (H) 07/23/2023 0925   HDL 47 07/23/2023 0925   CHOLHDL 3.4 07/23/2023 0925   CHOLHDL 5.1 (H) 08/14/2016 1729   VLDL 74 (H) 08/14/2016 1729   LDLCALC 86 07/23/2023 0925   Hepatic Function Panel     Component Value Date/Time   PROT 7.3 07/23/2023 0925   PROT 8.2 04/22/2016 0750   ALBUMIN 4.6 07/23/2023 0925   ALBUMIN 3.8 04/22/2016 0750   AST 30 07/23/2023 0925   AST 29 12/29/2019 1015   AST 27 04/22/2016 0750   ALT 30 07/23/2023 0925   ALT 33 12/29/2019 1015   ALT 24 04/22/2016 0750   ALKPHOS 82 07/23/2023 0925   ALKPHOS 74 04/22/2016 0750   BILITOT 0.5 07/23/2023 0925   BILITOT 0.6 12/29/2019 1015   BILITOT 0.44 04/22/2016 0750   BILIDIR 0.2 08/18/2010 0515   IBILI 1.5 (H) 08/18/2010 0515      Component Value Date/Time   TSH 1.370 06/21/2022 1616   Nutritional Lab Results  Component Value Date   VD25OH 33.96 04/23/2023   VD25OH 29.26 (L) 10/21/2022   VD25OH 31.2 06/21/2022    Attestations:   I, Camryn Mix, acting as a Stage manager for [employer], DO., have compiled all relevant documentation for today's office visit on behalf of Thomasene Lot, DO, while in the presence of [employer], DO.  I have reviewed the above documentation for accuracy and completeness, and I agree with the above. Sarah [name], D.O.  The 21st Century Cures Act was signed into law in 2016 which includes the topic of electronic health records.  This provides immediate access to information in MyChart.  This includes consultation notes, operative notes, office notes, lab results and pathology reports.  If you have any questions about what you read please let  us know at your next visit so we can discuss your concerns and take corrective action if need be.  We are right here with you.

## 2023-09-20 ENCOUNTER — Other Ambulatory Visit (INDEPENDENT_AMBULATORY_CARE_PROVIDER_SITE_OTHER): Payer: Self-pay | Admitting: Family Medicine

## 2023-09-20 DIAGNOSIS — F39 Unspecified mood [affective] disorder: Secondary | ICD-10-CM

## 2023-09-22 ENCOUNTER — Ambulatory Visit: Payer: Medicare PPO | Attending: Internal Medicine | Admitting: Internal Medicine

## 2023-09-22 VITALS — BP 109/72 | HR 74 | Temp 98.0°F | Ht 65.0 in | Wt 206.0 lb

## 2023-09-22 DIAGNOSIS — Z6834 Body mass index (BMI) 34.0-34.9, adult: Secondary | ICD-10-CM

## 2023-09-22 DIAGNOSIS — E785 Hyperlipidemia, unspecified: Secondary | ICD-10-CM | POA: Diagnosis not present

## 2023-09-22 DIAGNOSIS — M545 Low back pain, unspecified: Secondary | ICD-10-CM

## 2023-09-22 DIAGNOSIS — Z7984 Long term (current) use of oral hypoglycemic drugs: Secondary | ICD-10-CM | POA: Diagnosis not present

## 2023-09-22 DIAGNOSIS — E119 Type 2 diabetes mellitus without complications: Secondary | ICD-10-CM | POA: Diagnosis not present

## 2023-09-22 DIAGNOSIS — Z7985 Long-term (current) use of injectable non-insulin antidiabetic drugs: Secondary | ICD-10-CM | POA: Diagnosis not present

## 2023-09-22 DIAGNOSIS — E1159 Type 2 diabetes mellitus with other circulatory complications: Secondary | ICD-10-CM

## 2023-09-22 DIAGNOSIS — G8929 Other chronic pain: Secondary | ICD-10-CM

## 2023-09-22 DIAGNOSIS — I152 Hypertension secondary to endocrine disorders: Secondary | ICD-10-CM

## 2023-09-22 DIAGNOSIS — I1310 Hypertensive heart and chronic kidney disease without heart failure, with stage 1 through stage 4 chronic kidney disease, or unspecified chronic kidney disease: Secondary | ICD-10-CM

## 2023-09-22 DIAGNOSIS — G620 Drug-induced polyneuropathy: Secondary | ICD-10-CM

## 2023-09-22 DIAGNOSIS — E669 Obesity, unspecified: Secondary | ICD-10-CM | POA: Diagnosis not present

## 2023-09-22 DIAGNOSIS — Z8543 Personal history of malignant neoplasm of ovary: Secondary | ICD-10-CM

## 2023-09-22 DIAGNOSIS — T451X5A Adverse effect of antineoplastic and immunosuppressive drugs, initial encounter: Secondary | ICD-10-CM

## 2023-09-22 DIAGNOSIS — E1169 Type 2 diabetes mellitus with other specified complication: Secondary | ICD-10-CM

## 2023-09-22 DIAGNOSIS — N1831 Chronic kidney disease, stage 3a: Secondary | ICD-10-CM | POA: Diagnosis not present

## 2023-09-22 MED ORDER — HYDRALAZINE HCL 25 MG PO TABS: 50.0000 mg | ORAL_TABLET | Freq: Three times a day (TID) | ORAL | 3 refills | Status: DC

## 2023-09-22 MED ORDER — DULOXETINE HCL 20 MG PO CPEP: 20.0000 mg | ORAL_CAPSULE | Freq: Every day | ORAL | 3 refills | Status: DC

## 2023-09-22 NOTE — Progress Notes (Unsigned)
 Patient ID: Sarah [name], female    DOB: 11/20/1965  MRN: [id number]  CC: Hypertension (HTN & DM f/u. Franchot Erichsen gabapentin & metoprolol - would like alternatives/)   Subjective: Sarah [name] is a 58 y.o. female who presents for chronic ds management. 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 (S/p adjuvant chemotherapy completed on 12/08/14. S/p ex lap, hernia repair and small bowel resection for a presumed recurrence (benign pathology), Hypercalcemia with elevated free light chains (BM bx Dr. Mosetta Putt negative, no need for further f/u), dx with [facility] by Dr. Everardo All, 1.1 cm RLL nodule (bx negative 01/2020), RT thyroid nodule (does not met criteria for bx 10/2019),                       Discussed the use of AI scribe software for clinical note transcription with the patient, who gave verbal consent to proceed.  History of Present Illness   The patient, with a history of diabetes, obesity, neuropathy due to diabetes and chemotherapy, and ovarian cancer, presents for chronic disease management.   DM/Obesity: Lab Results  Component Value Date   HGBA1C 6.0 (H) 07/23/2023   She is currently being followed by a medical weight management specialist and is on Mounjaro 15mg  and metformin 500mg  once daily for diabetes management. She also takes gabapentin 600mg  three times daily for neuropathy.  Weight has plateaued on maximum dose of the Mounjaro.  States she was told by her weight management doctor that gabapentin and metoprolol can cause weight gain.  She wants to explore other options for these medications She also has a continuous glucose monitor and reports that her blood sugar levels have been stable, although she is sometimes woken up at night by her CGM with blood sugar readings in the 60s.  She states that she would lie there  for a while and then the readings would come back up without any other intervention.    -She continues to do well with her eating habits. She continues to go to the gym 3 times a week to participate in the [employer] program which involves aerobic exercise.  She also walks 2 days a week.  HL: Reports compliance with rosuvastatin 20 mg daily.  Last LDL done 07/2023 was 86 with goal being less than 70.    HTN: Reports compliance with taking amlodipine 10 mg daily, hydralazine 25 mg 1/2 tablets 3 times a day, Cozaar 50 mg daily and metoprolol 25 mg half a tablet daily.  Checks her blood pressure but not often.  Reports good readings.  I see that her blood pressure readings have been good when she sees her medical weight management doctor. The patient has history of chronic mid to lower back pain.  She has been experiencing constant pain in the left lower back and hip for approximately six to seven months. The pain is worse after sitting for long periods and causes stiffness upon standing. The patient uses Lidoderm patches for pain management, which she reports to be effective. She also occasionally uses tramadol for pain relief.  We did x-ray of the lumbar spine October 2023 that revealed some degenerative disc changes.  The patient has a history of ovarian cancer but has been released from follow-up by her gynecologist.      Patient Active Problem List  Diagnosis Date Noted   Drug-induced constipation 02/22/2023   Polyneuropathy due to other toxic agents (HCC)-  due to chermo 02/22/2023   Chronic cough 02/05/2023   Gastroesophageal reflux disease 08/28/2022   Opioid use agreement exists 07/21/2022   Degenerative disc disease, thoracic 07/21/2022   Degenerative disc disease, lumbar 07/21/2022   Essential hypertension 07/10/2022   Multiple thyroid nodules 06/21/2022   Type 2 diabetes mellitus with obesity (HCC) 05/28/2022   Environmental allergies 05/28/2022   Class 2 severe obesity with  serious comorbidity and body mass index (BMI) of 37.0 to 37.9 in adult (HCC) 05/28/2022   H/O gastroesophageal reflux (GERD) 05/28/2022   Hypomagnesemia 02/04/2022   Mixed stress and urge urinary incontinence 10/04/2021   S/P hernia repair 07/03/2021   Atherosclerosis of aorta (HCC) 05/28/2021   Mood disorder (HCC) with emotional eating 05/21/2021   Cutaneous abscess of back excluding buttocks 01/12/2021   Depressed mood, with emotional eating 10/23/2020   Seasonal allergies 09/05/2020   Class 2 severe obesity with serious comorbidity and body mass index (BMI) of 36.0 to 36.9 in adult (HCC) 07/24/2020   At risk for impaired metabolic function 06/12/2020   Hypertension associated with type 2 diabetes mellitus (HCC) 05/15/2020   Stage 3a chronic kidney disease (CKD) (HCC) 05/01/2020   Type 2 diabetes mellitus with chronic kidney disease, with long-term current use of insulin (HCC) 04/17/2020   At risk for hypoglycemia 04/17/2020   B12 deficiency 02/29/2020   Diabetes mellitus (HCC) 02/15/2020   Vitamin D deficiency 02/15/2020   Other insomnia 01/24/2020   Morbid obesity (HCC) 01/24/2020   Elevated serum immunoglobulin free light chains 01/21/2020   Thyroid nodule 10/14/2019   Renal insufficiency 06/24/2019   Hypercalcemia 06/24/2019   Hiatal hernia with GERD without esophagitis 06/18/2017   Need for influenza vaccination 02/17/2017   Ventral hernia 01/24/2016   Type 2 diabetes mellitus without complication, without long-term current use of insulin (HCC) 10/04/2015   Generalized obesity 10/04/2015   Hyperlipidemia associated with type 2 diabetes mellitus (HCC) 07/27/2015   Neuropathic pain of both legs 07/27/2015   Obesity (BMI 30.0-34.9) 12/24/2014   Genetic testing 11/01/2014   Functional constipation 09/24/2014   Chemotherapy-induced peripheral neuropathy (HCC) 09/24/2014   Midline low back pain without sciatica 09/24/2014   Family history of colon cancer    Hilar adenopathy     Hypertension associated with diabetes (HCC)    Pulmonary nodule 1 cm or greater in diameter      Current Outpatient Medications on File Prior to Visit  Medication Sig Dispense Refill   ACCU-CHEK GUIDE TEST test strip USE TO CHECK BLOOD SUGAR 3 TIMES DAILY. 300 strip 3   Accu-Chek Softclix Lancets lancets 1 each 3 (three) times daily.     albuterol (VENTOLIN HFA) 108 (90 Base) MCG/ACT inhaler Inhale 2 puffs into the lungs every 6 (six) hours as needed for wheezing or shortness of breath. 8 g 0   amLODipine (NORVASC) 10 MG tablet Take 1 tablet (10 mg total) by mouth daily. 90 tablet 3   amoxicillin (AMOXIL) 500 MG capsule Take 1 capsule (500 mg total) by mouth every 8 (eight) hours until gone. 21 capsule 0   Blood Glucose Monitoring Suppl (ACCU-CHEK GUIDE) w/Device KIT Use to check blood sugar 3 times daily. 1 kit 0   buPROPion (WELLBUTRIN SR) 200 MG 12 hr tablet Take 1 tablet (200 mg total) by mouth 2 (two) times daily. 60 tablet 0   Cholecalciferol (VITAMIN D3) 25 MCG (1000 UT) CAPS  Take 1 capsule (1,000 Units total) by mouth daily. Per Endo in 10/2022 60 capsule    Continuous Glucose Receiver (FREESTYLE LIBRE 3 READER) DEVI USE AS DIRECTED 1 each 2   Continuous Glucose Sensor (FREESTYLE LIBRE 3 SENSOR) MISC Place 1 sensor on the skin every 14 days. Use to check glucose continuously. Dx E11.69 2 each 6   cyanocobalamin (VITAMIN B12) 500 MCG tablet 500 mcg every other day     dorzolamide-timolol (COSOPT) 2-0.5 % ophthalmic solution Place 1 drop into both eyes 2 (two) times daily. 10 mL 3   fluticasone (FLONASE) 50 MCG/ACT nasal spray USE 2 SPRAYS IN EACH NOSTRIL EVERY DAY 48 g 3   gabapentin (NEURONTIN) 600 MG tablet Take 1 tablet (600 mg total) by mouth 3 (three) times daily. 270 tablet 2   hydrALAZINE (APRESOLINE) 25 MG tablet TAKE 1 AND 1/2 TABLETS THREE TIMES DAILY 405 tablet 3   ibuprofen (ADVIL) 600 MG tablet Take 1 tablet (600 mg total) by mouth every 6 (six) hours as needed for pain 30  tablet 0   Insulin Pen Needle (DROPLET PEN NEEDLES) 29G X MISC USE AS DIRECTED 100 each 0   Lancets Misc. (ACCU-CHEK SOFTCLIX LANCET DEV) KIT Check blood sugars 3 times a day. 3 kit 6   latanoprost (XALATAN) 0.005 % ophthalmic solution Place 1 drop into both eyes every evening at bedtime as directed 2.5 mL 6   levocetirizine (XYZAL) 5 MG tablet Take 1 tablet (5 mg total) by mouth every evening. 90 tablet 0   lidocaine (LIDODERM) 5 % APPLY 1 PATCH ONTO THE SKIN DAILY. REMOVE AND DISCARD PATCH WITHIN 12 HOURS OR AS DIRECTED BY MD 90 patch 1   losartan (COZAAR) 50 MG tablet TAKE 1 TABLET EVERY DAY 90 tablet 0   metFORMIN (GLUCOPHAGE) 500 MG tablet Take 1 tablet (500 mg total) by mouth daily with breakfast. 90 tablet 3   metoprolol tartrate (LOPRESSOR) 25 MG tablet TAKE 1/2 TABLET TWICE DAILY 90 tablet 3   montelukast (SINGULAIR) 10 MG tablet Take 1 tablet (10 mg total) by mouth at bedtime. 90 tablet 3   omega-3 acid ethyl esters (LOVAZA) 1 g capsule Take 2 capsules (2 g total) by mouth 2 (two) times daily. 180 capsule 0   omeprazole (PRILOSEC) 20 MG capsule Take 1 capsule (20 mg total) by mouth daily. 90 capsule 0   ondansetron (ZOFRAN) 4 MG tablet Take 1 tablet by mouth every 6 hours as needed severe nausea 30 tablet 0   rosuvastatin (CRESTOR) 20 MG tablet Take 1 tablet (20 mg total) by mouth daily. 90 tablet 3   solifenacin (VESICARE) 5 MG tablet Take 1 tablet (5 mg total) by mouth daily. 90 tablet 3   tirzepatide (MOUNJARO) 15 MG/0.5ML Pen Inject 15 mg into the skin once a week every Thursday 2 mL 0   triamcinolone cream (KENALOG) 0.1 % Apply 1 Application topically 2 (two) times daily. 30 g 0   No current facility-administered medications on file prior to visit.    Allergies  Allergen Reactions   Emend [Aprepitant] Other (See Comments)    Urticaria    Lisinopril Cough    Social History   Socioeconomic History   Marital status: Married    Spouse name: Not on file   Number of  children: 2   Years of education: Not on file   Highest education level: 12th grade  Occupational History   Occupation: disability  Tobacco Use   Smoking status: Never  Smokeless tobacco: Never  Vaping Use   Vaping status: Never Used  Substance and Sexual Activity   Alcohol use: No   Drug use: No   Sexual activity: Not Currently    Birth control/protection: None  Other Topics Concern   Not on file  Social History Narrative   No issues with transportation.    Attends church   Social Drivers of Corporate investment banker Strain: Low Risk  (07/01/2023)   Overall Financial Resource Strain (CARDIA)    Difficulty of Paying Living Expenses: Not hard at all  Recent Concern: Financial Resource Strain - Medium Risk (05/19/2023)   Overall Financial Resource Strain (CARDIA)    Difficulty of Paying Living Expenses: Somewhat hard  Food Insecurity: No Food Insecurity (07/01/2023)   Hunger Vital Sign    Worried About Running Out of Food in the Last Year: Never true    Ran Out of Food in the Last Year: Never true  Transportation Needs: No Transportation Needs (07/01/2023)   PRAPARE - Administrator, Civil Service (Medical): No    Lack of Transportation (Non-Medical): No  Physical Activity: Sufficiently Active (07/01/2023)   Exercise Vital Sign    Days of Exercise per Week: 3 days    Minutes of Exercise per Session: 60 min  Stress: No Stress Concern Present (07/01/2023)   [employer] of Occupational Health - Occupational Stress Questionnaire    Feeling of Stress : Not at all  Social Connections: Moderately Integrated (07/01/2023)   Social Connection and Isolation Panel [NHANES]    Frequency of Communication with Friends and Family: More than three times a week    Frequency of Social Gatherings with Friends and Family: More than three times a week    Attends Religious Services: More than 4 times per year    Active Member of [employer] or Organizations: No    Attends Tax inspector Meetings: Never    Marital Status: Married  Catering manager Violence: Not At Risk (07/01/2023)   Humiliation, Afraid, Rape, and Kick questionnaire    Fear of Current or Ex-Partner: No    Emotionally Abused: No    Physically Abused: No    Sexually Abused: No    Family History  Problem Relation Age of Onset   Hypertension Mother    Hyperlipidemia Mother    Stroke Mother    Thyroid disease Mother    Arthritis Mother    Hearing loss Mother    Hypertension Father    Diabetes Father    Hyperlipidemia Father    Sudden death Father    Arthritis Father    Heart disease Father    Obesity Father    Cancer Sister 78       fibrosarcoma (back); currently 38   Cancer Brother    Prostate cancer Maternal Uncle    Colon cancer Paternal Aunt        Dx 62s; deceased 68s   Prostate cancer Paternal Uncle        currently 7   Cancer Paternal Uncle 30       "bone" ; unk. primary   Stomach cancer Paternal Uncle    Hypercalcemia Neg Hx     Past Surgical History:  Procedure Laterality Date   ABDOMINAL HYSTERECTOMY     CATARACT EXTRACTION     RT eye 11/28/22 and LT eye 12/19/2022   FOOT SURGERY  04/2018   [facility]   HERNIA REPAIR     INCISIONAL HERNIA REPAIR N/A  07/06/2015   Procedure: INCISIONAL HERNIA REPAIR ;  Surgeon: Emelia Loron, MD;  Location: [facility];  Service: General;  Laterality: N/A;   INCISIONAL HERNIA REPAIR N/A 07/03/2021   Procedure: Sherald Hess HERNIA REPAIR WITH MESH;  Surgeon: Axel Filler, MD;  Location: [facility];  Service: General;  Laterality: N/A;   INSERTION OF MESH N/A 07/06/2015   Procedure: WITH INSERTION OF PHASIX ST MESH;  Surgeon: Emelia Loron, MD;  Location: [facility];  Service: General;  Laterality: N/A;   LAPAROTOMY N/A 07/06/2015   Procedure: EXPLORATORY LAPAROTOMY;  Surgeon: Adolphus Birchwood, MD;  Location: [facility];  Service: Gynecology;  Laterality: N/A;   LYSIS OF ADHESION N/A 07/06/2015   Procedure:  LYSIS OF ADHESION RESECTION OF  MESENTERIC MASS BOWEL RESECTION ;  Surgeon: Adolphus Birchwood, MD;  Location: [facility];  Service: Gynecology;  Laterality: N/A;   LYSIS OF ADHESION N/A 07/03/2021   Procedure: EXPLORATORY LAPAROTOMY WITH LYSIS OF ADHESION;  Surgeon: Axel Filler, MD;  Location: [facility];  Service: General;  Laterality: N/A;   ROBOTIC ASSISTED TOTAL HYSTERECTOMY WITH BILATERAL SALPINGO OOPHERECTOMY Bilateral 07/28/2014   Procedure: ROBOTIC ASSISTED lysis of adhesions with biopsies, converted to LAPAROTOMY, bilateral salpingoorphorectomy, omentectomy,appendectomy;  Surgeon: Laurette Schimke, MD;  Location: [facility];  Service: Gynecology;  Laterality: Bilateral;   THYROIDECTOMY, PARTIAL     VIDEO BRONCHOSCOPY N/A 01/13/2020   Procedure: VIDEO BRONCHOSCOPY WITH BIOPSIES;  Surgeon: Loreli Slot, MD;  Location: [facility];  Service: Thoracic;  Laterality: N/A;    ROS: Review of Systems Negative except as stated above  PHYSICAL EXAM: BP 109/72 (BP Location: Right Arm, Patient Position: Sitting, Cuff Size: Large)   Pulse 74   Temp 98 F (36.7 C) (Oral)   Ht 5\' 5"  (1.651 m)   Wt 206 lb (93.4 kg)   LMP  (LMP Unknown)   SpO2 96%   BMI 34.28 kg/m   Wt Readings from Last 3 Encounters:  09/22/23 206 lb (93.4 kg)  09/10/23 203 lb (92.1 kg)  08/27/23 202 lb (91.6 kg)    Physical Exam  General appearance - alert, well appearing, and in no distress Mental status - normal mood, behavior, speech, dress, motor activity, and thought processes Chest - clear to auscultation, no wheezes, rales or rhonchi, symmetric air entry Heart - normal rate, regular rhythm, normal S1, S2, no murmurs, rubs, clicks or gallops Neurological -straight leg raise negative on both sides.  Power 5/5 bilaterally in both lower extremities Musculoskeletal -no tenderness on palpation of the lumbar spine Extremities -no lower extremity edema.      Latest Ref Rng & Units 07/23/2023    9:25 AM 05/22/2023   11:40 AM 10/21/2022   12:16 PM  CMP   Glucose 70 - 99 mg/dL 88  92    BUN 6 - 24 mg/dL 19  18    Creatinine 4.09 - 1.00 mg/dL 8.11  9.14    Sodium 782 - 144 mmol/L 143  141    Potassium 3.5 - 5.2 mmol/L 4.4  4.7    Chloride 96 - 106 mmol/L 106  103    CO2 20 - 29 mmol/L 23  21    Calcium 8.7 - 10.2 mg/dL 95.6  21.3    Total Protein 6.0 - 8.5 g/dL 7.3   7.7   Total Bilirubin 0.0 - 1.2 mg/dL 0.5     Alkaline Phos 44 - 121 IU/L 82     AST 0 - 40 IU/L 30     ALT 0 -  32 IU/L 30      Lipid Panel     Component Value Date/Time   CHOL 161 07/23/2023 0925   TRIG 162 (H) 07/23/2023 0925   HDL 47 07/23/2023 0925   CHOLHDL 3.4 07/23/2023 0925   CHOLHDL 5.1 (H) 08/14/2016 1729   VLDL 74 (H) 08/14/2016 1729   LDLCALC 86 07/23/2023 0925    CBC    Component Value Date/Time   WBC 5.4 03/28/2022 1508   RBC 4.97 03/28/2022 1508   HGB 15.0 03/28/2022 1508   HGB 13.3 02/03/2020 0837   HGB 13.6 12/04/2018 0914   HGB 12.9 04/22/2016 0750   HCT 46.8 (H) 03/28/2022 1508   HCT 41.0 12/04/2018 0914   HCT 39.0 04/22/2016 0750   PLT 318 03/28/2022 1508   PLT 270 02/03/2020 0837   PLT 332 12/04/2018 0914   MCV 94.2 03/28/2022 1508   MCV 88 12/04/2018 0914   MCV 92.2 04/22/2016 0750   MCH 30.2 03/28/2022 1508   MCHC 32.1 03/28/2022 1508   RDW 13.6 03/28/2022 1508   RDW 13.6 12/04/2018 0914   RDW 13.9 04/22/2016 0750   LYMPHSABS 2.4 03/28/2022 1508   LYMPHSABS 2.7 04/22/2016 0750   MONOABS 0.8 03/28/2022 1508   MONOABS 1.0 (H) 04/22/2016 0750   EOSABS 0.1 03/28/2022 1508   EOSABS 0.1 04/22/2016 0750   BASOSABS 0.0 03/28/2022 1508   BASOSABS 0.0 04/22/2016 0750    ASSESSMENT AND PLAN: 1. Type 2 diabetes mellitus with obesity (HCC) (Primary) ***  2. Diabetes mellitus treated with oral medication (HCC) ***  3. Long-term (current) use of injectable non-insulin antidiabetic drugs ***  4. Stage 3a chronic kidney disease (HCC) ***  5. Hypertension associated with diabetes (HCC) *** - hydrALAZINE (APRESOLINE) 25 MG  tablet; Take 2 tablets (50 mg total) by mouth 3 (three) times daily. TAKE 1 AND 1/2 TABLETS THREE TIMES DAILY  Dispense: 270 tablet; Refill: 3  6. Hyperlipidemia associated with type 2 diabetes mellitus (HCC) ***  7. Chronic right-sided low back pain without sciatica ***  8. Chemotherapy-induced peripheral neuropathy (HCC) *** - DULoxetine (CYMBALTA) 20 MG capsule; Take 1 capsule (20 mg total) by mouth daily. Start after tapering off Gabapentin  Dispense: 30 capsule; Refill: 3  9. History of ovarian cancer ***   Patient was given the opportunity to ask questions.  Patient verbalized understanding of the plan and was able to repeat key elements of the plan.   This documentation was completed using Paediatric nurse.  Any transcriptional errors are unintentional.  No orders of the defined types were placed in this encounter.    Requested Prescriptions    No prescriptions requested or ordered in this encounter    No follow-ups on file.  Concetta Dee, MD, FACP

## 2023-09-22 NOTE — Patient Instructions (Addendum)
 VISIT SUMMARY:  You came in today for a follow-up on your chronic conditions, including diabetes, neuropathy, hypertension, and chronic low back pain. We discussed your current medications and concerns about weight fluctuations, as well as your ongoing pain management strategies.  YOUR PLAN:  -DIABETES MELLITUS TYPE 2: Your diabetes is well-controlled with your current medications, Mounjaro and metformin, with an A1c of 6.0. You reported occasional low blood sugar readings at night, which may be due to sensor errors. Continue your current medications and use a manual glucometer to verify low readings. If low readings at nights are verified with manual glucometer check, stop metformin and send me a Mychart message  -DIABETIC AND CHEMOTHERAPY-INDUCED NEUROPATHY: Neuropathy is nerve damage that can cause pain and numbness. You are currently taking gabapentin, but due to concerns about weight gain, we will switch you to Cymbalta. Taper off gabapentin over the next six weeks and then start Cymbalta 20 mg daily.  Start taking gabapentin 600 mg twice a day for 2 weeks then 600 mg once a day for 2 weeks then 300 mg once a day for 2 weeks then stop.  Once you have stopped the gabapentin, start the Cymbalta.  -HYPERTENSION: Your blood pressure is well-controlled. We will stop metoprolol due to concerns about weight gain and increase your hydralazine dosage to 50 mg three times a day instead. Continue taking amlodipine and losartan as prescribed.  -CHRONIC KIDNEY DISEASE STAGE 3: Your kidney function is below optimal levels. Avoid using NSAIDs for long periods as they can harm your kidneys.  -HYPERLIPIDEMIA: Your cholesterol level is at 86, which is good but should be below 70 for diabetic patients. Continue taking rosuvastatin 20 mg daily.  -CHRONIC LOW BACK PAIN WITH DEGENERATIVE CHANGES: You have chronic pain in your lower back, which is being managed with Lidoderm patches and tramadol. Continue using these  as needed, and consider applying the patches to the right side if the pain is more prominent there. You can also use a heating pad and muscle relaxers as needed.  INSTRUCTIONS:  Please follow up with your provider if you continue to experience low blood sugar readings at night. Taper off gabapentin as instructed and start Cymbalta after the tapering period. Continue monitoring your blood pressure and kidney function. Avoid long-term use of NSAIDs. Continue your current medications for diabetes, hypertension, and cholesterol management. Use Lidoderm patches, tramadol, heating pad, and muscle relaxers as needed for back pain.

## 2023-09-24 ENCOUNTER — Encounter: Payer: Self-pay | Admitting: Internal Medicine

## 2023-09-24 MED ORDER — HYDRALAZINE HCL 50 MG PO TABS: 50.0000 mg | ORAL_TABLET | Freq: Three times a day (TID) | ORAL | 3 refills | Status: DC

## 2023-09-25 ENCOUNTER — Ambulatory Visit (INDEPENDENT_AMBULATORY_CARE_PROVIDER_SITE_OTHER): Admitting: Family Medicine

## 2023-09-25 ENCOUNTER — Encounter (INDEPENDENT_AMBULATORY_CARE_PROVIDER_SITE_OTHER): Payer: Self-pay | Admitting: Family Medicine

## 2023-09-25 VITALS — BP 132/83 | HR 31 | Temp 97.4°F | Ht 65.0 in | Wt 204.0 lb

## 2023-09-25 DIAGNOSIS — Z7984 Long term (current) use of oral hypoglycemic drugs: Secondary | ICD-10-CM

## 2023-09-25 DIAGNOSIS — Z7985 Long-term (current) use of injectable non-insulin antidiabetic drugs: Secondary | ICD-10-CM | POA: Diagnosis not present

## 2023-09-25 DIAGNOSIS — Z6833 Body mass index (BMI) 33.0-33.9, adult: Secondary | ICD-10-CM

## 2023-09-25 DIAGNOSIS — I152 Hypertension secondary to endocrine disorders: Secondary | ICD-10-CM

## 2023-09-25 DIAGNOSIS — E1169 Type 2 diabetes mellitus with other specified complication: Secondary | ICD-10-CM

## 2023-09-25 DIAGNOSIS — E1159 Type 2 diabetes mellitus with other circulatory complications: Secondary | ICD-10-CM | POA: Diagnosis not present

## 2023-09-25 NOTE — Progress Notes (Signed)
 Sarah [name], D.O.  ABFM, ABOM Specializing in Clinical Bariatric Medicine  Office located at: [address] [place], [place]  [address]   Assessment and Plan:   FOR THE DISEASE OF OBESITY:  BMI 33.0-33.9,adult --  Current BMI 33.95 Morbid obesity-starting bmi 37.94 Assessment & Plan: Since last office visit on 09/10/2023 patient's  Muscle mass has increased by 0.6 lb. Fat mass has increased by 0.6 lb. Total body water  has not changed (78.6 lbs) Counseling done on how various foods will affect these numbers and how to maximize success  Total lbs lost to date: 24 lbs  Total weight loss percentage to date: 10.53%    Recommended Dietary Goals Sarah [name] is currently in the action stage of change. As such, her goal is to continue weight management plan.  She has agreed to: continue journaling plan   Behavioral Intervention We discussed the following today: adaptive thermogenesis, avoid skipping meals, continue to work on tracking and journaling calories using tracking application.  Additional resources provided today: Handout on low glycemic and low calorie fruits and veggies, Handout on adaptive thermogenesis.   Evidence-based interventions for health behavior change were utilized today including the discussion of self monitoring techniques, problem-solving barriers and SMART goal setting techniques.   Regarding patient's less desirable eating habits and patterns, we employed the technique of small changes.   Pt will specifically work on: eating every 3-5 hours and adhering to journaling goals   Recommended Physical Activity Goals Sarah [name] has been advised to work up to 300-450 minutes of moderate intensity aerobic activity a week and strengthening exercises 2-3 times per week for cardiovascular health, weight loss maintenance and preservation of muscle mass.   She has agreed to : continue to gradually increase the amount and intensity of exercise  routine   Pharmacotherapy We both agreed to :  continue same regimen   FOR ASSOCIATED CONDITIONS ADDRESSED TODAY:  Type 2 diabetes mellitus with obesity (HCC) Assessment & Plan: Most recent A1c:  Lab Results  Component Value Date   HGBA1C 6.0 (H) 07/23/2023   HGBA1C 6.4 04/23/2023   HGBA1C 6.3 01/20/2023    On Metformin  500 mg daily and Mounjaro  15 mg weekly. She reports low blood sugar readings sometimes at night and is woken up by her CGM. Discussed that it is not uncommon for CGM to register low falsely at nights if patient is laying on the side that the [name] is on.  Additionally, pt is being tapered off her gabapentin  per her PCP; they are considering low-dose Cymbalta  in the future for her neuropathy symptoms. Continue adherence to all medications and prudent nutritional plan.   Hypertension associated with diabetes Assessment & Plan: Last 3 blood pressure readings in our office are as follows: BP Readings from Last 3 Encounters:  09/25/23 132/83  09/22/23 109/72  09/10/23 117/76   The 10-year ASCVD risk score (Arnett DK, et al., 2019) is: 13.3%  Lab Results  Component Value Date   CREATININE 1.35 (H) 07/23/2023   Met with her PCP on 09/22/2023. PCP d/c Metoprolol  because of its potential obesogenic properties. She also increased her Hydralazine  to 50 mg 3 times daily. Kept on same dose of Amlodipine  and Cozaar . Her blood pressure is stable today. Continue current regimen and adherence to low sodium diet plan.   Follow up:   Return 10/09/2023. She was informed of the importance of frequent follow up visits to maximize her success with intensive lifestyle modifications for her multiple health conditions.  Subjective:   Chief complaint: Obesity Sarah [name] is here to discuss her progress with her obesity treatment plan. She is  journaling 1300-1400 cal and  85+g protein with the Category 2 MP as a guide  and states she is following her eating plan approximately 50% of the  time. She states she is going to the gym 50 minutes, 3 days per week and walking 30 minutes, 2 days per week.   Interval History:  Sarah [name] is here for a follow up office visit. Since last OV on 09/10/2023, Sarah [name] is up 1 lb. She has been journaling her intake. Notes that she hit her goals on some days. On other days, she was either below or above her recommended calorie target. Sometimes skips breakfast and lunch. Her hunger/cravings are controlled.   Pharmacotherapy that aid weight loss: She is currently taking Wellbutrin  SR 200 mg twice daily, Metformin  500 mg once daily, and Mounjaro  15 mg once weekly.   Review of Systems:  Pertinent positives were addressed with patient today.  Reviewed by clinician on day of visit: allergies, medications, problem list, medical history, surgical history, family history, social history, and previous encounter notes.  Weight Summary and Biometrics   Weight Lost Since Last Visit: 0  Weight Gained Since Last Visit: 1lb   Vitals Temp: (!) 97.4 F (36.3 C) BP: 132/83 Pulse Rate: (!) 31 SpO2: 93 %   Anthropometric Measurements Height: 5\' 5"  (1.651 m) Weight: 204 lb (92.5 kg) BMI (Calculated): 33.95 Weight at Last Visit: 203lb Weight Lost Since Last Visit: 0 Weight Gained Since Last Visit: 1lb Starting Weight: 228lb Total Weight Loss (lbs): 24 lb (10.9 kg) Peak Weight: 228lb   Body Composition  Body Fat %: 41.3 % Fat Mass (lbs): 84.4 lbs Muscle Mass (lbs): 114.2 lbs Total Body Water  (lbs): 78.6 lbs Visceral Fat Rating : 11   Other Clinical Data Fasting: no Labs: no Today's Visit #: 81 Starting Date: 02/15/20   Objective:   PHYSICAL EXAM: Blood pressure 132/83, pulse (!) 31, temperature (!) 97.4 F (36.3 C), height 5\' 5"  (1.651 m), weight 204 lb (92.5 kg), SpO2 93%. Body mass index is 33.95 kg/m.  General: she is overweight, cooperative and in no acute distress. PSYCH: Has normal mood, affect and thought process.    HEENT: EOMI, sclerae are anicteric. Lungs: Normal breathing effort, no conversational dyspnea. Extremities: Moves * 4 Neurologic: A and O * 3, good insight  DIAGNOSTIC DATA REVIEWED: BMET    Component Value Date/Time   NA 143 07/23/2023 0925   NA 138 04/22/2016 0750   K 4.4 07/23/2023 0925   K 3.9 04/22/2016 0750   CL 106 07/23/2023 0925   CO2 23 07/23/2023 0925   CO2 26 04/22/2016 0750   GLUCOSE 88 07/23/2023 0925   GLUCOSE 81 03/28/2022 1508   GLUCOSE 142 (H) 04/22/2016 0750   BUN 19 07/23/2023 0925   BUN 19.1 11/20/2016 1355   CREATININE 1.35 (H) 07/23/2023 0925   CREATININE 1.26 (H) 12/29/2019 1015   CREATININE 1.1 11/20/2016 1355   CALCIUM  11.4 (H) 07/23/2023 0925   CALCIUM  9.8 04/22/2016 0750   GFRNONAA 47 (L) 03/28/2022 1508   GFRNONAA 48 (L) 12/29/2019 1015   GFRNONAA 63 07/08/2014 1212   GFRAA 69 04/17/2020 0928   GFRAA 56 (L) 12/29/2019 1015   GFRAA 73 07/08/2014 1212   Lab Results  Component Value Date   HGBA1C 6.0 (H) 07/23/2023   HGBA1C 10.1 (H) 07/07/2015   No results found for: "INSULIN "  Lab Results  Component Value Date   TSH 1.370 06/21/2022   CBC    Component Value Date/Time   WBC 5.4 03/28/2022 1508   RBC 4.97 03/28/2022 1508   HGB 15.0 03/28/2022 1508   HGB 13.3 02/03/2020 0837   HGB 13.6 12/04/2018 0914   HGB 12.9 04/22/2016 0750   HCT 46.8 (H) 03/28/2022 1508   HCT 41.0 12/04/2018 0914   HCT 39.0 04/22/2016 0750   PLT 318 03/28/2022 1508   PLT 270 02/03/2020 0837   PLT 332 12/04/2018 0914   MCV 94.2 03/28/2022 1508   MCV 88 12/04/2018 0914   MCV 92.2 04/22/2016 0750   MCH 30.2 03/28/2022 1508   MCHC 32.1 03/28/2022 1508   RDW 13.6 03/28/2022 1508   RDW 13.6 12/04/2018 0914   RDW 13.9 04/22/2016 0750   Iron Studies    Component Value Date/Time   IRON 43 08/18/2010 0515   TIBC 389 08/18/2010 0515   FERRITIN 227 08/18/2010 0515   IRONPCTSAT 11 (L) 08/18/2010 0515   Lipid Panel     Component Value Date/Time   CHOL 161  07/23/2023 0925   TRIG 162 (H) 07/23/2023 0925   HDL 47 07/23/2023 0925   CHOLHDL 3.4 07/23/2023 0925   CHOLHDL 5.1 (H) 08/14/2016 1729   VLDL 74 (H) 08/14/2016 1729   LDLCALC 86 07/23/2023 0925   Hepatic Function Panel     Component Value Date/Time   PROT 7.3 07/23/2023 0925   PROT 8.2 04/22/2016 0750   ALBUMIN 4.6 07/23/2023 0925   ALBUMIN 3.8 04/22/2016 0750   AST 30 07/23/2023 0925   AST 29 12/29/2019 1015   AST 27 04/22/2016 0750   ALT 30 07/23/2023 0925   ALT 33 12/29/2019 1015   ALT 24 04/22/2016 0750   ALKPHOS 82 07/23/2023 0925   ALKPHOS 74 04/22/2016 0750   BILITOT 0.5 07/23/2023 0925   BILITOT 0.6 12/29/2019 1015   BILITOT 0.44 04/22/2016 0750   BILIDIR 0.2 08/18/2010 0515   IBILI 1.5 (H) 08/18/2010 0515      Component Value Date/Time   TSH 1.370 06/21/2022 1616   Nutritional Lab Results  Component Value Date   VD25OH 33.96 04/23/2023   VD25OH 29.26 (L) 10/21/2022   VD25OH 31.2 06/21/2022    Attestations:   I, Special Puri, acting as a Stage manager for Sarah Sensor, DO., have compiled all relevant documentation for today's office visit on behalf of Sarah Sensor, DO, while in the presence of [employer], DO.  I have reviewed the above documentation for accuracy and completeness, and I agree with the above. Sarah [name], D.O.  The 21st Century Cures Act was signed into law in 2016 which includes the topic of electronic health records.  This provides immediate access to information in MyChart.  This includes consultation notes, operative notes, office notes, lab results and pathology reports.  If you have any questions about what you read please let us  know at your next visit so we can discuss your concerns and take corrective action if need be.  We are right here with you.

## 2023-10-05 ENCOUNTER — Other Ambulatory Visit: Payer: Self-pay | Admitting: Internal Medicine

## 2023-10-05 DIAGNOSIS — I10 Essential (primary) hypertension: Secondary | ICD-10-CM

## 2023-10-09 ENCOUNTER — Ambulatory Visit (INDEPENDENT_AMBULATORY_CARE_PROVIDER_SITE_OTHER): Admitting: Family Medicine

## 2023-10-09 ENCOUNTER — Encounter (INDEPENDENT_AMBULATORY_CARE_PROVIDER_SITE_OTHER): Payer: Self-pay | Admitting: Family Medicine

## 2023-10-09 ENCOUNTER — Other Ambulatory Visit (HOSPITAL_COMMUNITY): Payer: Self-pay

## 2023-10-09 VITALS — BP 129/80 | HR 75 | Temp 98.2°F | Ht 65.0 in | Wt 203.0 lb

## 2023-10-09 DIAGNOSIS — E669 Obesity, unspecified: Secondary | ICD-10-CM

## 2023-10-09 DIAGNOSIS — Z7985 Long-term (current) use of injectable non-insulin antidiabetic drugs: Secondary | ICD-10-CM

## 2023-10-09 DIAGNOSIS — Z6833 Body mass index (BMI) 33.0-33.9, adult: Secondary | ICD-10-CM | POA: Diagnosis not present

## 2023-10-09 DIAGNOSIS — E1142 Type 2 diabetes mellitus with diabetic polyneuropathy: Secondary | ICD-10-CM

## 2023-10-09 DIAGNOSIS — E1169 Type 2 diabetes mellitus with other specified complication: Secondary | ICD-10-CM

## 2023-10-09 DIAGNOSIS — Z7984 Long term (current) use of oral hypoglycemic drugs: Secondary | ICD-10-CM | POA: Diagnosis not present

## 2023-10-09 MED ORDER — TIRZEPATIDE 15 MG/0.5ML ~~LOC~~ SOAJ
15.0000 mg | SUBCUTANEOUS | 0 refills | Status: DC
  Filled 2023-10-09: qty 6, 84d supply, fill #0

## 2023-10-09 NOTE — Progress Notes (Signed)
 Sarah [name], D.O.  ABFM, ABOM Specializing in Clinical Bariatric Medicine  Office located at: [address] [place], [place]  [address]   Assessment and Plan:   Medications Discontinued During This Encounter  Medication Reason   amoxicillin  (AMOXIL ) 500 MG capsule    tirzepatide  (MOUNJARO ) 15 MG/0.5ML Pen Reorder     Meds ordered this encounter  Medications   tirzepatide  (MOUNJARO ) 15 MG/0.5ML Pen    Sig: Inject 15 mg into the skin once a week every Thursday    Dispense:  6 mL    Refill:  0    FOR THE DISEASE OF OBESITY:  BMI 33.0-33.9,adult --  Current BMI 33.78 Morbid obesity-starting bmi 37.94 Assessment & Plan: Since last office visit on 09/25/2023 patient's  Muscle mass has increased by 2.6 lb. Fat mass has decreased by 4.2 lb. Total body water  has decreased by 3.4 lb.  Counseling done on how various foods will affect these numbers and how to maximize success  Total lbs lost to date: 25 lbs  Total weight loss percentage to date: 10.96%    Recommended Dietary Goals Sarah [name] is currently in the action stage of change. As such, her goal is to continue weight management plan.  She has agreed to: continue current plan   Behavioral Intervention We discussed the following today: continue to work on maintaining a reduced calorie state, getting the recommended amount of protein, incorporating whole foods, making healthy choices, staying well hydrated and practicing mindfulness when eating.  Additional resources provided today: Handout on Food Journaling Log  Evidence-based interventions for health behavior change were utilized today including the discussion of self monitoring techniques, problem-solving barriers and SMART goal setting techniques.   Regarding patient's less desirable eating habits and patterns, we employed the technique of small changes.   Pt will specifically work: bring in food tracking log to next OV   Recommended Physical Activity  Goals Sarah [name] has been advised to work up to 300-450 minutes of moderate intensity aerobic activity a week and strengthening exercises 2-3 times per week for cardiovascular health, weight loss maintenance and preservation of muscle mass.   She has agreed to : continue to gradually increase the amount and intensity of exercise routine   Pharmacotherapy We both agreed to :  continue same regimen   ASSOCIATED CONDITIONS ADDRESSED TODAY:  Type 2 diabetes mellitus with obesity (HCC) Assessment & Plan: Most recent A1c: Lab Results  Component Value Date   HGBA1C 6.0 (H) 07/23/2023   HGBA1C 6.4 04/23/2023   HGBA1C 6.3 01/20/2023    Taking Metformin  500 mg daily and Mounjaro  15 mg weekly with reported good compliance and tolerance. HgbA1c is at goal for age and comorbid conditions. Denies symptoms of hypoglycemia or hyperglycemia. Her cravings & hunger are controlled. Maintain with current regimen. Continue with journaling plan low on processed crabs and simple sugars. Losing 10% or more of body weight may improve condition.   Diabetic polyneuropathy associated with type 2 diabetes mellitus (HCC) Assessment & Plan: Her PCP recently started her on Cymbalta  and is also tapering her off Gabapentin . States that her neuropathy symptoms are well controlled. Continue treatment per PCP and weight loss efforts.     Follow up:   Return in about 13 days (around 10/22/2023). She was informed of the importance of frequent follow up visits to maximize her success with intensive lifestyle modifications for her multiple health conditions.  Subjective:   Chief complaint: Obesity Sarah [name] is here to discuss her progress with  her obesity treatment plan. She is  journaling 1300-1400 cal and  85+g protein with the Category 2 MP as a guide  and states she is following her eating plan approximately 75% of the time. She states she is going to the gym 50 minutes 3 days per week and walking 30 minutes 2 days a week.    Interval History:  Sarah [name] is here for a follow up office visit. Since last OV on 09/25/2023, Sarah [name] is down 1 #. States that her clothes having been fitting slightly looser. Is journaling religiously. States she skipped breakfast on 10 days since LOV; apart from this she has been doing well with getting in her meals and hitting her journaling and protein goals.   Pharmacotherapy that aid with weight loss: Taking Wellbutrin  SR 200 mg twice daily, Metformin  500 mg once daily, and Mounjaro  15 mg once weekly.   Review of Systems:  Pertinent positives were addressed with patient today.Reviewed by clinician on day of visit: allergies, medications, problem list, medical history, surgical history, family history, social history, and previous encounter notes.  Weight Summary and Biometrics   Weight Lost Since Last Visit: 1lb  Weight Gained Since Last Visit: 0lb   Vitals Temp: 98.2 F (36.8 C) BP: 129/80 Pulse Rate: 75 SpO2: 100 %   Anthropometric Measurements Height: 5\' 5"  (1.651 m) Weight: 203 lb (92.1 kg) BMI (Calculated): 33.78 Weight at Last Visit: 204lb Weight Lost Since Last Visit: 1lb Weight Gained Since Last Visit: 0lb Starting Weight: 228lb Total Weight Loss (lbs): 25 lb (11.3 kg) Peak Weight: 228lb   Body Composition  Body Fat %: 39.5 % Fat Mass (lbs): 80.2 lbs Muscle Mass (lbs): 116.8 lbs Total Body Water  (lbs): 75.2 lbs Visceral Fat Rating : 11   Other Clinical Data Fasting: No Labs: No Today's Visit #: 82   Objective:   PHYSICAL EXAM: Blood pressure 129/80, pulse 75, temperature 98.2 F (36.8 C), height 5\' 5"  (1.651 m), weight 203 lb (92.1 kg), SpO2 100%. Body mass index is 33.78 kg/m.  General: she is overweight, cooperative and in no acute distress. PSYCH: Has normal mood, affect and thought process.   HEENT: EOMI, sclerae are anicteric. Lungs: Normal breathing effort, no conversational dyspnea. Extremities: Moves * 4 Neurologic: A  and O * 3, good insight  DIAGNOSTIC DATA REVIEWED: BMET    Component Value Date/Time   NA 143 07/23/2023 0925   NA 138 04/22/2016 0750   K 4.4 07/23/2023 0925   K 3.9 04/22/2016 0750   CL 106 07/23/2023 0925   CO2 23 07/23/2023 0925   CO2 26 04/22/2016 0750   GLUCOSE 88 07/23/2023 0925   GLUCOSE 81 03/28/2022 1508   GLUCOSE 142 (H) 04/22/2016 0750   BUN 19 07/23/2023 0925   BUN 19.1 11/20/2016 1355   CREATININE 1.35 (H) 07/23/2023 0925   CREATININE 1.26 (H) 12/29/2019 1015   CREATININE 1.1 11/20/2016 1355   CALCIUM  11.4 (H) 07/23/2023 0925   CALCIUM  9.8 04/22/2016 0750   GFRNONAA 47 (L) 03/28/2022 1508   GFRNONAA 48 (L) 12/29/2019 1015   GFRNONAA 63 07/08/2014 1212   GFRAA 69 04/17/2020 0928   GFRAA 56 (L) 12/29/2019 1015   GFRAA 73 07/08/2014 1212   Lab Results  Component Value Date   HGBA1C 6.0 (H) 07/23/2023   HGBA1C 10.1 (H) 07/07/2015   No results found for: "INSULIN " Lab Results  Component Value Date   TSH 1.370 06/21/2022   CBC    Component Value Date/Time  WBC 5.4 03/28/2022 1508   RBC 4.97 03/28/2022 1508   HGB 15.0 03/28/2022 1508   HGB 13.3 02/03/2020 0837   HGB 13.6 12/04/2018 0914   HGB 12.9 04/22/2016 0750   HCT 46.8 (H) 03/28/2022 1508   HCT 41.0 12/04/2018 0914   HCT 39.0 04/22/2016 0750   PLT 318 03/28/2022 1508   PLT 270 02/03/2020 0837   PLT 332 12/04/2018 0914   MCV 94.2 03/28/2022 1508   MCV 88 12/04/2018 0914   MCV 92.2 04/22/2016 0750   MCH 30.2 03/28/2022 1508   MCHC 32.1 03/28/2022 1508   RDW 13.6 03/28/2022 1508   RDW 13.6 12/04/2018 0914   RDW 13.9 04/22/2016 0750   Iron Studies    Component Value Date/Time   IRON 43 08/18/2010 0515   TIBC 389 08/18/2010 0515   FERRITIN 227 08/18/2010 0515   IRONPCTSAT 11 (L) 08/18/2010 0515   Lipid Panel     Component Value Date/Time   CHOL 161 07/23/2023 0925   TRIG 162 (H) 07/23/2023 0925   HDL 47 07/23/2023 0925   CHOLHDL 3.4 07/23/2023 0925   CHOLHDL 5.1 (H) 08/14/2016  1729   VLDL 74 (H) 08/14/2016 1729   LDLCALC 86 07/23/2023 0925   Hepatic Function Panel     Component Value Date/Time   PROT 7.3 07/23/2023 0925   PROT 8.2 04/22/2016 0750   ALBUMIN 4.6 07/23/2023 0925   ALBUMIN 3.8 04/22/2016 0750   AST 30 07/23/2023 0925   AST 29 12/29/2019 1015   AST 27 04/22/2016 0750   ALT 30 07/23/2023 0925   ALT 33 12/29/2019 1015   ALT 24 04/22/2016 0750   ALKPHOS 82 07/23/2023 0925   ALKPHOS 74 04/22/2016 0750   BILITOT 0.5 07/23/2023 0925   BILITOT 0.6 12/29/2019 1015   BILITOT 0.44 04/22/2016 0750   BILIDIR 0.2 08/18/2010 0515   IBILI 1.5 (H) 08/18/2010 0515      Component Value Date/Time   TSH 1.370 06/21/2022 1616   Nutritional Lab Results  Component Value Date   VD25OH 33.96 04/23/2023   VD25OH 29.26 (L) 10/21/2022   VD25OH 31.2 06/21/2022    Attestations:   I, Special Puri , acting as a Stage manager for Marceil Sensor, DO., have compiled all relevant documentation for today's office visit on behalf of Marceil Sensor, DO, while in the presence of [employer], DO.  I have reviewed the above documentation for accuracy and completeness, and I agree with the above. Sarah [name], D.O.  The 21st Century Cures Act was signed into law in 2016 which includes the topic of electronic health records.  This provides immediate access to information in MyChart.  This includes consultation notes, operative notes, office notes, lab results and pathology reports.  If you have any questions about what you read please let us  know at your next visit so we can discuss your concerns and take corrective action if need be.  We are right here with you.

## 2023-10-11 ENCOUNTER — Other Ambulatory Visit: Payer: Self-pay | Admitting: Internal Medicine

## 2023-10-21 ENCOUNTER — Ambulatory Visit: Payer: Medicare PPO | Admitting: Internal Medicine

## 2023-10-21 ENCOUNTER — Encounter: Payer: Self-pay | Admitting: Internal Medicine

## 2023-10-21 DIAGNOSIS — E042 Nontoxic multinodular goiter: Secondary | ICD-10-CM

## 2023-10-21 NOTE — Progress Notes (Addendum)
 Patient ID: Sarah [name], female   DOB: [birth year], 58 y.o.   MRN: [id number]  HPI  Sarah [name] is a 58 y.o.-year-old pleasant  female, returning for follow-up for hypercalcemia/hyperparathyroidism, also thyroid  nodules and controlled DM2.  She previously saw Dr. Washington Hacker, but last visit with me was 6 months ago.  Interim history: She continues to have constipation and also occasional nausea, especially with larger meals. She continues to be followed in Weight management clinic. On Mounjaro .  She exercises consistently.  Hypercalcemia:  Pt was dx with hypercalcemia in 2020. I reviewed pt's pertinent labs: Lab Results  Component Value Date   PTH CANCELED 04/23/2023   PTH CANCELED 04/23/2023   PTH 29 10/21/2022   PTH 16 06/21/2022   PTH 26 07/26/2020   PTH 26 04/29/2019   PTH Comment 04/29/2019   PTH 21 03/22/2019   PTH Comment 03/22/2019   Lab Results  Component Value Date   CAION 5.9 (H) 04/23/2023   CAION 6.0 (H) 10/21/2022   CAION 5.9 (H) 06/21/2022   07/23/2023: Corrected calcium  10.9 (8.7-10.2) Lab Results  Component Value Date   CALCIUM  11.4 (H) 07/23/2023   CALCIUM  11.7 (H) 05/22/2023   CALCIUM  12.2 (H) 06/21/2022   CALCIUM  11.3 (H) 03/28/2022   CALCIUM  12.1 (H) 01/09/2022   CALCIUM  10.5 (H) 07/04/2021   CALCIUM  11.3 (H) 06/26/2021   CALCIUM  12.1 (H) 05/28/2021   CALCIUM  12.3 (H) 03/07/2021   CALCIUM  11.5 (H) 11/29/2020   CALCIUM  11.7 (H) 07/26/2020   CALCIUM  11.9 (H) 04/17/2020   CALCIUM  10.7 (H) 01/11/2020   CALCIUM  11.7 (H) 12/29/2019   CALCIUM  10.9 (H) 10/06/2019   CALCIUM  10.6 (H) 09/29/2019   CALCIUM  12.0 (H) 09/23/2019   CALCIUM  10.5 (H) 06/16/2019   CALCIUM  11.1 (H) 04/29/2019   CALCIUM  10.9 (H) 03/22/2019   CALCIUM  10.6 (H) 12/04/2018   CALCIUM  10.4 (H) 11/23/2018   CALCIUM  10.0 08/28/2018   CALCIUM  9.7 11/17/2017   CALCIUM  9.8 04/22/2016   CALCIUM  10.1 01/22/2016   CALCIUM  10.3 10/02/2015   CALCIUM  8.9 07/10/2015   CALCIUM  9.2 07/09/2015    CALCIUM  9.3 07/08/2015   CALCIUM  9.4 07/07/2015   CALCIUM  9.5 07/03/2015   CALCIUM  9.9 04/27/2015   CALCIUM  10.1 03/27/2015   CALCIUM  8.9 12/22/2014   CALCIUM  9.5 12/08/2014   CALCIUM  9.2 11/24/2014   CALCIUM  9.0 11/17/2014   CALCIUM  8.7 11/03/2014   CALCIUM  10.5 (H) 10/27/2014   CALCIUM  8.4 10/18/2014   CALCIUM  9.3 10/03/2014   CALCIUM  9.4 09/22/2014   CALCIUM  10.3 09/15/2014   CALCIUM  9.2 09/08/2014   CALCIUM  9.5 09/01/2014   CALCIUM  9.7 08/19/2014   CALCIUM  9.1 07/31/2014   CALCIUM  9.4 07/30/2014   CALCIUM  8.5 07/29/2014   A 24-hour urine calcium  was normal (no creatinine associated with the reading): Component     Latest Ref Rng 08/16/2020  Calcium , 24H Urine     mg/24 h 81    A repeat 24-hour urine calcium  was low: Component     Latest Ref Rng 06/24/2022  Calcium , 24H Urine     mg/24 h 30 (L)   Creatinine, 24H Ur     0.50 - 2.15 g/24 h 1.02   The fractional excretion of calcium  is 0.0029% (<0.01%).  She does not take calcium  supplements.  No fractures or falls or evidence of osteoporosis.   No h/o kidney stones.  She has CKD stage III. Last BUN/Cr: Lab Results  Component Value Date   BUN 19 07/23/2023   BUN 18  05/22/2023   CREATININE 1.35 (H) 07/23/2023   CREATININE 1.26 (H) 05/22/2023   Pt was previously on HCTZ, which was stopped by Dr. Washington Hacker.  She is not on vitamin A .  She has a h/o vitamin D  deficiency. Reviewed vit D levels: Lab Results  Component Value Date   VD25OH 33.96 04/23/2023   VD25OH 29.26 (L) 10/21/2022   VD25OH 31.2 06/21/2022   VD25OH 35.7 10/22/2021   VD25OH 99.6 06/26/2021   VD25OH 81.1 03/07/2021   VD25OH 60.6 11/29/2020   VD25OH 41.54 07/26/2020   VD25OH 44.6 04/17/2020   VD25OH 38.2 02/15/2020  She was not taking vitamin D   at last OV - off x 1 year (was on Ergocalciferol ). She started 1000 units daily - 10/2022.  Calcitriol, vit A, and PTH-rp levels were normal, while chromogranin and FGF 23 were  elevated: Component     Latest Ref Rng 04/23/2023  Vitamin D  1, 25 (OH) Total     18 - 72 pg/mL 39   Vitamin D3 1, 25 (OH)     pg/mL 31   Vitamin D2 1, 25 (OH)     pg/mL 8   Chromogranin A (ng/mL)     0.0 - 101.8 ng/mL 108.5 (H)   Calcitonin CANCELED   FGF-23 Fibroblast Growth Factor     <180 RU/mL 248 (H)   PTH, Intact     pg/mL CANCELED     Component     Latest Ref Rng 03/22/2019 04/29/2019 07/26/2020  Vitamin D  1, 25 (OH) Total     18 - 72 pg/mL   24   Vitamin D3 1, 25 (OH)     pg/mL   <8   Vitamin D2 1, 25 (OH)     pg/mL   24   PTH-related peptide     pmol/L <2.0  <2.0    Vitamin A  (Retinoic Acid)     38 - 98 mcg/dL   73   PTH-Related Protein (PTH-RP)     11 - 20 pg/mL   17    Magnesium  levels were reviewed: Lab Results  Component Value Date   MG 1.6 06/21/2022   MG 1.5 (L) 01/09/2022   MG 1.6 11/29/2020   MG 1.7 07/12/2015   MG 1.9 07/11/2015   MG 2.0 07/10/2015   MG 2.2 07/09/2015   MG 2.3 07/08/2015   MG 1.9 08/19/2010   Phosphorus level was slightly low, but normalized: Lab Results  Component Value Date   PHOS 2.9 04/23/2023   PHOS 2.6 (L) 06/21/2022   Of note, patient has a history of increased free light chains - she had bone marrow biopsy for this and was seen by hematology but no further workup is needed for this and no follow-up was recommended.  We checked an SPEP >> normal: Component     Latest Ref Rng 10/21/2022  Total Protein     6.0 - 8.5 g/dL 7.7   Globulin, Total     2.2 - 3.9 g/dL 3.5   Alpha 1     0.0 - 0.4 g/dL 0.2   IgG (Immunoglobin G), Serum     586 - 1,602 mg/dL 4,098   IgM (Immunoglobulin M), Srm     26 - 217 mg/dL 45   Albumin SerPl Elph-Mcnc     2.9 - 4.4 g/dL 4.2   Alpha2 Glob SerPl Elph-Mcnc     0.4 - 1.0 g/dL 0.7   B-Globulin SerPl Elph-Mcnc     0.7 - 1.3 g/dL 1.2  Gamma Glob SerPl Elph-Mcnc     0.4 - 1.8 g/dL 1.3   M Protein SerPl Elph-Mcnc     Not Observed g/dL Not Observed   Albumin/Glob SerPl     0.7 -  1.7  1.3   IFE 1 Comment   Please Note (HCV): Comment   IgA/Immunoglobulin A, Serum     87 - 352 mg/dL 846    Reviewed imaging study reports: PET scan (12/08/2019): COMPARISON:  FDG PET scan 10/26/2019, CT 10/29/2019   FINDINGS: NECK No radiotracer activity in neck lymph nodes. Incidental CT findings: None   CHEST A 10 mm nodule again noted in the RIGHT lower lobe (image 79/4). Nodule has very mild radiotracer accumulation (SUV max 2.5).   Incidental finding activity in the RIGHT lobe of thyroid  gland is favored benign.   Incidental CT finding:No mediastinal adenopathy.   ABDOMEN/PELVIS   No abnormal radiotracer accumulation within liver. No foci of neuroendocrine tumor within the peritoneal space of the abdomen or pelvis. No abnormality of the bowel.   Physiologic activity noted in the liver, spleen, adrenal glands and kidneys.   Incidental CT findings:Large ventral hernia contains nonobstructed loops small bowel not changed from prior. Atherosclerotic calcification of the aorta.   Post hysterectomy.  Adnexa unremarkable   SKELETON   No focal activity to suggest skeletal metastasis.   Incidental CT findings:None   IMPRESSION: 1. Very low radiotracer activity associated with the RIGHT lobe pulmonary nodule is nonspecific. Findings would not be consistent with metastatic well-differentiated carcinoid tumor. Primary bronchial carcinoid can have low somatostatin receptor activity. Overall favor benign/indolent lesion. 2. No evidence of well differentiated neuroendocrine tumor within the abdomen pelvis.  I was not aware about this at last visit, but patient actually had a biopsy of the right lower lobe nodule: Bronchial biopsy (01/13/2020):   Cytology:   Chest CT scan from 07/11/2020: 1.1 cm right lower lobe nodule appears to be mildly progressive, with slow growth, but without increased uptake on the PET scan and dotatate scan.   The radiologist suggested that  this may be an indolent neoplasm such as pulmonary carcinoid.  Nuclear medicine bone scan (07/09/2022): Uptake at the shoulders, sternoclavicular joints, wrists, knees, ankles/feet, typically degenerative. Uptake at lateral aspects of lower lumbar spine at L4-L5 likely reflecting degenerative changes. No additional worrisome sites of abnormal osseous tracer accumulation are identified. Expected urinary tract and soft tissue distribution of tracer.   IMPRESSION: Scattered degenerative type uptake of tracer at multiple joints and at lower lumbar spine. No scintigraphic evidence of osseous metastatic disease.  CT chest (01/22/2023): Cardiovascular: Atherosclerotic calcification of the aorta with age advanced involvement of the coronary arteries. Enlarged pulmonic trunk and heart. No pericardial effusion.   Mediastinum/Nodes: Left thyroidectomy. No pathologically enlarged mediastinal or axillary lymph nodes. Hilar regions are difficult to definitively evaluate without IV contrast. Esophagus is grossly unremarkable.   Lungs/Pleura: Minimal scattered pulmonary parenchymal scarring. 11 mm right infrahilar intrapulmonary lymph node (5/72), unchanged from 07/11/2020 but enlarged from 01/12/2015, at which time it measured 4 mm. No suspicious pulmonary nodules. No pleural fluid. Airway is unremarkable.   Upper Abdomen: Visualized portions of the liver gallbladder, adrenal glands, spleen, pancreas and stomach are grossly unremarkable. No upper abdominal adenopathy.   Musculoskeletal: Degenerative changes in the spine. Intramuscular lipoma along the posterior right scapula. No worrisome lytic or sclerotic lesions.   IMPRESSION: 1. Perihilar right lower lobe nodule is stable from recent prior examinations but has increased in size from 01/12/2015. Indolent carcinoid  remains a diagnostic consideration. 2. Age advanced three-vessel coronary artery calcification. 3.  Aortic atherosclerosis  (ICD10-I70.0). 4. Enlarged pulmonic trunk, indicative of pulmonary arterial hypertension.  Pt does not have a FH of hypercalcemia, pituitary tumors, thyroid  cancer, or osteoporosis.  Possible FH of kidney stones in father.  Thyroid  nodules: Patient was found to have several thyroid  nodules:  Thyroid  U/S (08/17/2010): Right thyroid  lobe:  Measures 5.1 x 1.5 x 1.8 cm.  Heterogeneous echogenicity.  Left thyroid  lobe:  Measures 8.6 x 3.6 x 4.5 cm.  Heterogeneous echogenicity.  Isthmus: Measures 1.3 cm.   Focal nodules:  There are multiple bilateral thyroid  nodules:   The largest lesion in the right lobe measures 1.4 x 0.8 x 1.0 cm and is in the upper pole region.   There is a large lesion in the isthmus which measures 4.0 x 1.5 x 2.5 cm.   The left lobe is occupied by to large complex thyroid  nodules which  are largely solid but have cystic components.  The largest lesion  measures 4.8 x 3.7 x 4.1 cm and is in the lower aspect of the lobe.   The adjacent lesion measures 4.1 x 2.5 x 4.0 cm.  Fine-needle  aspiration/biopsy of the two left lobe lesions are suggested to  exclude malignancy.   Lymphadenopathy:  None visualized.   IMPRESSION:   Multinodular thyroid  goiter.  The largest lesions in the left  lower lobe are complex and fine-needle aspiration/biopsy is  suggested.  The other lesions can be followed depending on the  pathology.   FNA of the 2 L thyroid  nodules (08/29/2010): Both benign  She had left thyroidectomy + isthmusectomy (10/23/2010):  Thyroid , lobectomy, left with isthmus - ADENOMATOUS GOITER. - THERE IS NO EVIDENCE OF MALIGNANCY.  Grossly, there are two dominant lesions present. The larger measures 6.2 cm and is present in the left lobe. There is a smaller nodule present in the isthmus, spanning 2.2 cm. Histologic evaluation of both lesions reveals mixed macro and microfollicular adenomas. Malignant features are not identified. (JK:mw 10-24-10)  Thyroid  U/S  (10/22/2019): 3 nodules, spongiform and solid/cystic nodular areas that did not need follow-up: Parenchymal Echotexture: Moderately heterogenous  Isthmus: Surgically absent.  Right lobe: 6.2 x 2.1 x 2.8 cm  Left lobe: Surgically absent.  _________________________________________________________   Estimated total number of nodules >/= 1 cm: 3  _________________________________________________________   Nodule # 1:  Prior biopsy: No  Location: Right; Superior  Maximum size: 1.7 cm; Other 2 dimensions: 1.4 x 1.4 cm, previously, 1.4 cm  Composition: mixed cystic and solid (1)  Echogenicity: isoechoic (1) Change in features: Largely cystic and demonstrating interval cystic degeneration since 2012. Change in ACR TI-RADS risk category: Yes This nodule does NOT meet TI-RADS criteria for biopsy or dedicated follow-up.  _________________________________________________________   Nodule # 2:  Location: Right; Inferior  Maximum size: 2.1 cm; Other 2 dimensions: 1.2 x 1.3 cm  Composition: spongiform (0) This nodule does NOT meet TI-RADS criteria for biopsy or dedicated follow-up.  _________________________________________________________   Three other small vague areas were measured in the tip of the right lobe inferiorly which all appear to be adjacent/part of the same spongiform area referred to as nodule #2 above. Irregardless, these spongiform areas even if they were considered separate nodules do not meet criteria for biopsy or further follow-up.   No abnormal tissue is identified in the left neck at the level of prior thyroidectomy. No abnormal lymph nodes identified.   IMPRESSION: Residual right lobe of the  thyroid  demonstrates spongiform and solid/cystic nodular areas that do not meet criteria for biopsy or further follow-up.   Previous thyroid  tests reviewed: Lab Results  Component Value Date   TSH 1.370 06/21/2022   TSH 0.938 02/15/2020   TSH 0.977 04/29/2019   TSH 0.698  03/22/2019   TSH 0.75 08/14/2016   TSH 1.860 07/31/2014   TSH 1.867 12/10/2010   TSH 0.996 08/16/2010   Lab Results  Component Value Date   FREET4 1.29 02/15/2020   FREET4 1.01 12/10/2010   FREET4 1.07 08/16/2010   + FH of thyroid  ds. In M and sister.  DM2: - dx'ed 2015-2016 at the time of her ovarian surgery - insulin  soon after DM, but was able to stop this due to good control of  Reviewed HbA1c levels: Lab Results  Component Value Date   HGBA1C 6.0 (H) 07/23/2023   HGBA1C 6.4 04/23/2023   HGBA1C 6.3 01/20/2023   HGBA1C 6.0 09/17/2022   HGBA1C 5.5 05/17/2022   HGBA1C 5.5 01/09/2022   HGBA1C 5.7 10/04/2021   HGBA1C 6.2 (H) 06/26/2021   HGBA1C 7.0 (H) 03/07/2021   HGBA1C 7.2 (H) 11/29/2020   HGBA1C 5.6 06/14/2020   HGBA1C 6.9 (H) 04/17/2020   HGBA1C 9.0 (H) 01/11/2020   HGBA1C 9.8 (A) 09/23/2019   HGBA1C 9.3 (A) 03/22/2019   HGBA1C 10.8 (H) 12/04/2018   HGBA1C 9.6 (H) 08/28/2018   HGBA1C 7.6 (A) 05/22/2018   HGBA1C 7.6 (A) 02/20/2018   HGBA1C 7.8 09/03/2017   On metformin  and Mounjaro .  Managed by PCP/weight management specialist. Please see my previous note for details.  Pt. also has a history of HTN, GERD, B12 deficiency, obesity class II, depression, and also history of ovarian cancer for which she had chemotherapy in the past, with subsequent peripheral neuropathy-on gabapentin .  Also, she was found to have aortic atherosclerosis on the CT scan from 07/2020.  ROS: + see HPI  Past Medical History:  Diagnosis Date   Arthritis    Diabetes mellitus without complication (HCC)    Family history of adverse reaction to anesthesia    sister-n/v   Family history of colon cancer    GERD (gastroesophageal reflux disease)    Glaucoma 07/2014   Glucagonoma 07/28/2014   Pt denies this but states she has glaucoma   History of chemotherapy    History of hiatal hernia    Hypercalcemia    Hypertension    Imbalance    Neuropathy    feet bilat   Nocturia     Peritoneal carcinomatosis (HCC)    carcinoma of ovary    Shortness of breath dyspnea    hx of 2013 - no problems currently    Sleep apnea    Thyroid  nodule    history of    Wears glasses    Past Surgical History:  Procedure Laterality Date   ABDOMINAL HYSTERECTOMY     CATARACT EXTRACTION     RT eye 11/28/22 and LT eye 12/19/2022   FOOT SURGERY  04/2018   [facility]   HERNIA REPAIR     INCISIONAL HERNIA REPAIR N/A 07/06/2015   Procedure: INCISIONAL HERNIA REPAIR ;  Surgeon: Enid Harry, MD;  Location: [facility];  Service: General;  Laterality: N/A;   INCISIONAL HERNIA REPAIR N/A 07/03/2021   Procedure: Estrella Hench HERNIA REPAIR WITH MESH;  Surgeon: Shela Derby, MD;  [facility];  Service: General;  Laterality: N/A;   INSERTION OF MESH N/A 07/06/2015   Procedure: WITH INSERTION OF PHASIX ST MESH;  Surgeon: Enid Harry, MD;  Location: [facility];  Service: General;  Laterality: N/A;   LAPAROTOMY N/A 07/06/2015   Procedure: EXPLORATORY LAPAROTOMY;  Surgeon: Alphonso Aschoff, MD;  Location: [facility];  Service: Gynecology;  Laterality: N/A;   LYSIS OF ADHESION N/A 07/06/2015   Procedure:  LYSIS OF ADHESION RESECTION OF MESENTERIC MASS BOWEL RESECTION ;  Surgeon: Alphonso Aschoff, MD;  Location: [facility];  Service: Gynecology;  Laterality: N/A;   LYSIS OF ADHESION N/A 07/03/2021   Procedure: EXPLORATORY LAPAROTOMY WITH LYSIS OF ADHESION;  Surgeon: Shela Derby, MD;  Location: [facility] OR;  Service: General;  Laterality: N/A;   ROBOTIC ASSISTED TOTAL HYSTERECTOMY WITH BILATERAL SALPINGO OOPHERECTOMY Bilateral 07/28/2014   Procedure: ROBOTIC ASSISTED lysis of adhesions with biopsies, converted to LAPAROTOMY, bilateral salpingoorphorectomy, omentectomy,appendectomy;  Surgeon: Terry Ficks, MD;  Location: [facility];  Service: Gynecology;  Laterality: Bilateral;   THYROIDECTOMY, PARTIAL     VIDEO BRONCHOSCOPY N/A 01/13/2020   Procedure: VIDEO BRONCHOSCOPY WITH BIOPSIES;  Surgeon: Zelphia Higashi, MD;  Location: [facility];  Service: Thoracic;  Laterality: N/A;   Social History   Socioeconomic History   Marital status: Married    Spouse name: Not on file   Number of children: 2   Years of education: Not on file   Highest education level: 12th grade  Occupational History   Occupation: disability  Tobacco Use   Smoking status: Never   Smokeless tobacco: Never  Vaping Use   Vaping status: Never Used  Substance and Sexual Activity   Alcohol use: No   Drug use: No   Sexual activity: Not Currently    Birth control/protection: None  Other Topics Concern   Not on file  Social History Narrative   No issues with transportation.    Attends church   Social Drivers of Corporate investment banker Strain: Low Risk  (07/01/2023)   Overall Financial Resource Strain (CARDIA)    Difficulty of Paying Living Expenses: Not hard at all  Recent Concern: Financial Resource Strain - Medium Risk (05/19/2023)   Overall Financial Resource Strain (CARDIA)    Difficulty of Paying Living Expenses: Somewhat hard  Food Insecurity: No Food Insecurity (07/01/2023)   Hunger Vital Sign    Worried About Running Out of Food in the Last Year: Never true    Ran Out of Food in the Last Year: Never true  Transportation Needs: No Transportation Needs (07/01/2023)   PRAPARE - Administrator, Civil Service (Medical): No    Lack of Transportation (Non-Medical): No  Physical Activity: Sufficiently Active (07/01/2023)   Exercise Vital Sign    Days of Exercise per Week: 3 days    Minutes of Exercise per Session: 60 min  Stress: No Stress Concern Present (07/01/2023)   [employer] of Occupational Health - Occupational Stress Questionnaire    Feeling of Stress : Not at all  Social Connections: Moderately Integrated (07/01/2023)   Social Connection and Isolation Panel [NHANES]    Frequency of Communication with Friends and Family: More than three times a week    Frequency of Social Gatherings with  Friends and Family: More than three times a week    Attends Religious Services: More than 4 times per year    Active Member of [employer] or Organizations: No    Attends Banker Meetings: Never    Marital Status: Married  Catering manager Violence: Not At Risk (07/01/2023)   Humiliation, Afraid, Rape, and Kick questionnaire    Fear  of Current or Ex-Partner: No    Emotionally Abused: No    Physically Abused: No    Sexually Abused: No   Current Outpatient Medications on File Prior to Visit  Medication Sig Dispense Refill   ACCU-CHEK GUIDE TEST test strip USE TO CHECK BLOOD SUGAR 3 TIMES DAILY. 300 strip 3   Accu-Chek Softclix Lancets lancets 1 each 3 (three) times daily.     albuterol  (VENTOLIN  HFA) 108 (90 Base) MCG/ACT inhaler Inhale 2 puffs into the lungs every 6 (six) hours as needed for wheezing or shortness of breath. 8 g 0   amLODipine  (NORVASC ) 10 MG tablet Take 1 tablet (10 mg total) by mouth daily. 90 tablet 3   Blood Glucose Monitoring Suppl (ACCU-CHEK GUIDE) w/Device KIT Use to check blood sugar 3 times daily. 1 kit 0   buPROPion  (WELLBUTRIN  SR) 200 MG 12 hr tablet Take 1 tablet (200 mg total) by mouth 2 (two) times daily. 60 tablet 0   Cholecalciferol (VITAMIN D3) 25 MCG (1000 UT) CAPS Take 1 capsule (1,000 Units total) by mouth daily. Per Endo in 10/2022 60 capsule    Continuous Glucose Receiver (FREESTYLE LIBRE 3 READER) DEVI USE AS DIRECTED 6 each 3   Continuous Glucose Sensor (FREESTYLE LIBRE 3 SENSOR) MISC Place 1 sensor on the skin every 14 days. Use to check glucose continuously. Dx E11.69 2 each 6   cyanocobalamin  (VITAMIN B12) 500 MCG tablet 500 mcg every other day     dorzolamide -timolol  (COSOPT ) 2-0.5 % ophthalmic solution Place 1 drop into both eyes 2 (two) times daily. 10 mL 3   DULoxetine  (CYMBALTA ) 20 MG capsule Take 1 capsule (20 mg total) by mouth daily. Start after tapering off Gabapentin  30 capsule 3   fluticasone  (FLONASE ) 50 MCG/ACT nasal spray USE 2  SPRAYS IN EACH NOSTRIL EVERY DAY 48 g 3   gabapentin  (NEURONTIN ) 600 MG tablet Take 1 tablet (600 mg total) by mouth 3 (three) times daily. 270 tablet 2   hydrALAZINE  (APRESOLINE ) 50 MG tablet Take 1 tablet (50 mg total) by mouth 3 (three) times daily. 270 tablet 3   ibuprofen  (ADVIL ) 600 MG tablet Take 1 tablet (600 mg total) by mouth every 6 (six) hours as needed for pain 30 tablet 0   Insulin  Pen Needle (DROPLET PEN NEEDLES) 29G X MISC USE AS DIRECTED 100 each 0   Lancets Misc. (ACCU-CHEK SOFTCLIX LANCET DEV) KIT Check blood sugars 3 times a day. 3 kit 6   latanoprost  (XALATAN ) 0.005 % ophthalmic solution Place 1 drop into both eyes every evening at bedtime as directed 2.5 mL 6   levocetirizine (XYZAL ) 5 MG tablet Take 1 tablet (5 mg total) by mouth every evening. 90 tablet 0   lidocaine  (LIDODERM ) 5 % APPLY 1 PATCH ONTO THE SKIN DAILY. REMOVE AND DISCARD PATCH WITHIN 12 HOURS OR AS DIRECTED BY MD 90 patch 1   losartan  (COZAAR ) 50 MG tablet TAKE 1 TABLET EVERY DAY 90 tablet 2   metFORMIN  (GLUCOPHAGE ) 500 MG tablet Take 1 tablet (500 mg total) by mouth daily with breakfast. 90 tablet 3   montelukast  (SINGULAIR ) 10 MG tablet Take 1 tablet (10 mg total) by mouth at bedtime. 90 tablet 3   omega-3 acid ethyl esters (LOVAZA ) 1 g capsule Take 2 capsules (2 g total) by mouth 2 (two) times daily. 180 capsule 0   omeprazole  (PRILOSEC) 20 MG capsule Take 1 capsule (20 mg total) by mouth daily. 90 capsule 0   ondansetron  (ZOFRAN ) 4  MG tablet Take 1 tablet by mouth every 6 hours as needed severe nausea 30 tablet 0   rosuvastatin  (CRESTOR ) 20 MG tablet Take 1 tablet (20 mg total) by mouth daily. 90 tablet 3   solifenacin  (VESICARE ) 5 MG tablet Take 1 tablet (5 mg total) by mouth daily. 90 tablet 3   tirzepatide  (MOUNJARO ) 15 MG/0.5ML Pen Inject 15 mg into the skin once a week every Thursday 6 mL 0   triamcinolone  cream (KENALOG ) 0.1 % Apply 1 Application topically 2 (two) times daily. 30 g 0   No  current facility-administered medications on file prior to visit.   Allergies  Allergen Reactions   Emend [Aprepitant] Other (See Comments)    Urticaria    Lisinopril  Cough   Family History  Problem Relation Age of Onset   Hypertension Mother    Hyperlipidemia Mother    Stroke Mother    Thyroid  disease Mother    Arthritis Mother    Hearing loss Mother    Hypertension Father    Diabetes Father    Hyperlipidemia Father    Sudden death Father    Arthritis Father    Heart disease Father    Obesity Father    Cancer Sister 54       fibrosarcoma (back); currently 74   Cancer Brother    Prostate cancer Maternal Uncle    Colon cancer Paternal Aunt        Dx 64s; deceased 79s   Prostate cancer Paternal Uncle        currently 83   Cancer Paternal Uncle 69       "bone" ; unk. primary   Stomach cancer Paternal Uncle    Hypercalcemia Neg Hx    PE: BP 120/70   Pulse 76   Ht 5\' 5"  (1.651 m)   Wt 204 lb 9.6 oz (92.8 kg)   LMP  (LMP Unknown)   SpO2 90%   BMI 34.05 kg/m  Wt Readings from Last 10 Encounters:  10/21/23 204 lb 9.6 oz (92.8 kg)  10/09/23 203 lb (92.1 kg)  09/25/23 204 lb (92.5 kg)  09/22/23 206 lb (93.4 kg)  09/10/23 203 lb (92.1 kg)  08/27/23 202 lb (91.6 kg)  08/06/23 200 lb (90.7 kg)  07/23/23 200 lb (90.7 kg)  07/09/23 199 lb (90.3 kg)  06/25/23 201 lb (91.2 kg)   Constitutional: overweight, in NAD Eyes: EOMI, no exophthalmos ENT: no thyromegaly, no cervical lymphadenopathy Cardiovascular: RRR, No MRG Respiratory: CTA B Musculoskeletal: no deformities Skin: no rashes Neurological: no tremor with outstretched hands,  Assessment: 1. Hypercalcemia  2. Thyroid  nodules  3. DM2 - now well-controlled - per PCP  Plan: Complex patient with history of elevated calcium  since 2020, with the highest level being 12.2, but with PTH levels borderline between the lower limit of the target interval and suppressed.  She previously had a PTH of 16 for calcium  of  12.2, but the PTH increased afterwards to 29, for an elevated ionized calcium  of 6.0.  Vitamin D  levels have been normal or only slightly low and we have her on 1000 units vitamin D  daily.  Latest vitamin D  level was normal.  She has no apparent complications from hypercalcemia: No history of nephrolithiasis, osteoporosis, fractures.  She does have a history of constipation which could be in the setting of Mounjaro . -We checked her for primary hyperparathyroidism but the investigation was inconclusive.  She initially had a low 24-hour urine calcium , normal calcitriol, low phosphate, normal magnesium  and  a normal vitamin A  and PTH rp.  PTH RP was previously normal, also.  We did try to check it magnesium  sestamibi scan but this was not covered by her insurance.  We previously checked a bone scan and this was negative for increased uptake.  We then repeated the 24-hour urine calcium  and this was normal but the creatinine level was not reported at that time.  My suspicion for FHH (familial hypercalcemic hypercalciuria) is low, since she only started to have elevated serum calcium  in 2020 and patients with this condition usually have high calcium  for the entire life.  She previously had elevated free light chains but she was investigated by hematology and cleared (she also had a bone marrow biopsy at that time) for multiple myeloma or MGUS.  We checked an SPEP and this was normal.  She also had a skeletal survey which showed no evidence of multiple myeloma or neoplastic bone disease in 06/2019.  A PET scan showed a 1.1 cm right lung nodule, which was read as possible pulmonary carcinoid.  An elevated FGF 23 and chromogranin A could be consistent with carcinoid, however, I was not aware of this at last visit, but PCP informed me that this nodule was actually biopsied and the results were benign, consistent with inflammatory changes. I am still wondering why this nodule would increase in size over time, though... -At  today's visit, we will check a TSH, calcitonin, PTH, calcium , vit D, CMP and will try to check another 24-hour urine calcium .  I would also recommend another PET scan, but only after the above investigation is complete. -I will see her back in 6 months  2.  Thyroid  nodules - Patient has a history of multinodular goiter and is status post left lobectomy + isthmusectomy in 2012 - In the right lobe, she has 3 thyroid  nodules, which appears to be benign review of the thyroid  ultrasound report from 10/2019.  One of the nodules was spongiform and the others appeared more like pseudo nodules (areas of inflammation).  It is interesting that the right side of the thyroid  had FDG uptake on her PET scan from 2021 - No neck compression symptoms - Will repeat a TSH today and will also add a calcitonin level to investigate for medullary thyroid  carcinoma.  I would also like to obtain a neck ultrasound to follow the nodules.  Patient Instructions  Please stop at the lab.  Please collect a 24h urine.  Patient information (Up-to-Date): Collection of a 24-hour urine specimen  - You should collect every drop of urine during each 24-hour period. It does not matter how much or little urine is passed each time, as long as every drop is collected. - Begin the urine collection in the morning after you wake up, after you have emptied your bladder for the first time. - Urinate (empty the bladder) for the first time and flush it down the toilet. Note the exact time (eg, 6:15 AM). You will begin the urine collection at this time. - Collect every drop of urine during the day and night in an empty collection bottle. Store the bottle at room temperature or in the refrigerator. - If you need to have a bowel movement, any urine passed with the bowel movement should be collected. Try not to include feces with the urine collection. If feces does get mixed in, do not try to remove the feces from the urine collection bottle. -  Finish by collecting the first urine passed the next  morning, adding it to the collection bottle. This should be within ten minutes before or after the time of the first morning void on the first day (which was flushed). In this example, you would try to void between 6:05 and 6:25 on the second day. - If you need to urinate one hour before the final collection time, drink a full glass of water  so that you can void again at the appropriate time. If you have to urinate 20 minutes before, try to hold the urine until the proper time. - Please note the exact time of the final collection, even if it is not the same time as when collection began on day 1. - The bottle(s) may be kept at room temperature for a day or two, but should be kept cool or refrigerated for longer periods of time.  Please return in 6 months.  Orders Placed This Encounter  Procedures   US  THYROID    TSH   Parathyroid  hormone, intact (no Ca)   Creatinine, urine, 24 hour   Calcium , urine, 24 hour   Comprehensive metabolic panel with GFR   Calcitonin   VITAMIN D  25 Hydroxy (Vit-D Deficiency, Fractures)   - Total time spent for the visit: 40 min, in precharting, postcharting, reviewing PCP's and weight management specialist;s last note, obtaining medical information from the chart and from the pt, reviewing her  previous labs, imaging evaluations, and treatments, reviewing her symptoms, counseling her about her above endocrine conditions (please see the discussed topics above), and developing a plan to further investigate and treat them.  Thyroid  U/S (10/30/2023) Parenchymal Echotexture: Moderately heterogenous  Isthmus: Surgically absent  Right lobe: 6.2 x 2.5 x 2.7 cm  Left lobe: Surgically absent  _________________________________________________________   Estimated total number of nodules >/= 1 cm: 2 _________________________________________________________   Surgical changes of prior left hemithyroidectomy and removal of  the thyroid  isthmus. The remaining right thyroid  gland is enlarged and contains numerous cysts and nodules.   Nodule # 1: The small hypoechoic solid nodule in the right upper gland measures 0.9 x 0.6 x 0.6 cm. Findings are consistent with TI-RADS category 4. Given size (<0.9 cm) and appearance, this nodule does NOT meet TI-RADS criteria for biopsy or dedicated follow-up.   Nodule # 2: Spongiform nodule in the right mid gland measures 1.8 x 1.9 x 1.2 cm. TI-RADS category 2. This nodule does NOT meet TI-RADS criteria for biopsy or dedicated follow-up.   Nodule # 3: Hypoechoic solid nodule in the right mid gland measures 0.9 x 0.5 x 0.8 cm. Findings are consistent with TI-RADS category 4. Given size (<0.9 cm) and appearance, this nodule does NOT meet TI-RADS criteria for biopsy or dedicated follow-up.   Nodule # 4: Vague region of heterogeneity in the right lower gland consistent with a pseudo nodule. True nodular margins are not identified.   Nodule # 5: Isoechoic solid nodule in the right lower gland measures 0.9 x 0.8 x 0.7 cm. Findings are consistent with TI-RADS category 3. Given size (<1.4 cm) and appearance, this nodule does NOT meet TI-RADS criteria for biopsy or dedicated follow-up.   IMPRESSION: 1. Surgical changes of prior left hemithyroidectomy without evidence of residual or recurrent disease. 2. Remaining right thyroid  gland is enlarged and heterogeneous containing multiple sonographically benign and low risk nodules and areas of pseudo nodularity. No individual lesion meets criteria to recommend biopsy or imaging surveillance.  Component     Latest Ref Rng 10/21/2023  Glucose     65 -  99 mg/dL 409 (H)   BUN     7 - 25 mg/dL 17   Creatinine     8.11 - 1.03 mg/dL 9.14 (H)   eGFR     > OR = 60 mL/min/1.51m2 59 (L)   BUN/Creatinine Ratio     6 - 22 (calc) 15   Sodium     135 - 146 mmol/L 139   Potassium     3.5 - 5.3 mmol/L 4.1   Chloride     98 - 110 mmol/L  104   CO2     20 - 32 mmol/L 26   Calcium      8.6 - 10.4 mg/dL 78.2 (H)   Total Protein     6.1 - 8.1 g/dL 7.9   Albumin MSPROF     3.6 - 5.1 g/dL 4.7   Globulin     1.9 - 3.7 g/dL (calc) 3.2   AG Ratio     1.0 - 2.5 (calc) 1.5   Total Bilirubin     0.2 - 1.2 mg/dL 0.6   Alkaline phosphatase (APISO)     37 - 153 U/L 74   AST     10 - 35 U/L 26   ALT     6 - 29 U/L 24   TSH     0.40 - 4.50 mIU/L 1.16   PTH, Intact     16 - 77 pg/mL 41   Calcitonin     <=5 pg/mL <2   Vitamin D , 25-Hydroxy     30 - 100 ng/mL 38   Vitamin D  is normal.  Kidney function is slightly low.  Calcitonin level is normal. Corrected calcium  is 11.7, elevated.  PTH is not suppressed, at 41.  Component     Latest Ref Rng 10/24/2023  Creatinine, 24H Ur     0.50 - 2.15 g/24 h 1.48   Calcium , 24H Urine     mg/24 h 78   FECa = 0.0049, lower than 0.01.  At this point, I feel we need to rule out FHH by genetic testing.  I am not sure if I can do this without a referral to genetics.  I will talk to the patient to see if she wants to pursue this.  Emilie Harden, MD PhD [facility] Endocr[facility]

## 2023-10-21 NOTE — Patient Instructions (Addendum)
Please stop at the lab.  Please collect a 24h urine: Patient information (Up-to-Date): Collection of a 24-hour urine specimen   - You should collect every drop of urine during each 24-hour period. It does not matter how much or little urine is passed each time, as long as every drop is collected. - Begin the urine collection in the morning after you wake up, after you have emptied your bladder for the first time. - Urinate (empty the bladder) for the first time and flush it down the toilet. Note the exact time (eg, 6:15 AM). You will begin the urine collection at this time. - Collect every drop of urine during the day and night in an empty collection bottle. Store the bottle at room temperature or in the refrigerator. - If you need to have a bowel movement, any urine passed with the bowel movement should be collected. Try not to include feces with the urine collection. If feces does get mixed in, do not try to remove the feces from the urine collection bottle. - Finish by collecting the first urine passed the next morning, adding it to the collection bottle. This should be within ten minutes before or after the time of the first morning void on the first day (which was flushed). In this example, you would try to void between 6:05 and 6:25 on the second day. - If you need to urinate one hour before the final collection time, drink a full glass of water so that you can void again at the appropriate time. If you have to urinate 20 minutes before, try to hold the urine until the proper time. - Please note the exact time of the final collection, even if it is not the same time as when collection began on day 1. - The bottle(s) may be kept at room temperature for a day or two, but should be kept cool or refrigerated for longer periods of time.  Please return in 6 months.

## 2023-10-22 ENCOUNTER — Ambulatory Visit: Payer: Self-pay | Admitting: Internal Medicine

## 2023-10-22 ENCOUNTER — Ambulatory Visit (INDEPENDENT_AMBULATORY_CARE_PROVIDER_SITE_OTHER): Admitting: Family Medicine

## 2023-10-22 ENCOUNTER — Encounter (INDEPENDENT_AMBULATORY_CARE_PROVIDER_SITE_OTHER): Payer: Self-pay | Admitting: Family Medicine

## 2023-10-22 VITALS — BP 136/88 | HR 82 | Temp 97.4°F | Ht 65.0 in | Wt 200.0 lb

## 2023-10-22 DIAGNOSIS — I152 Hypertension secondary to endocrine disorders: Secondary | ICD-10-CM

## 2023-10-22 DIAGNOSIS — E669 Obesity, unspecified: Secondary | ICD-10-CM | POA: Diagnosis not present

## 2023-10-22 DIAGNOSIS — E1169 Type 2 diabetes mellitus with other specified complication: Secondary | ICD-10-CM | POA: Diagnosis not present

## 2023-10-22 DIAGNOSIS — F5089 Other specified eating disorder: Secondary | ICD-10-CM | POA: Diagnosis not present

## 2023-10-22 DIAGNOSIS — Z7985 Long-term (current) use of injectable non-insulin antidiabetic drugs: Secondary | ICD-10-CM

## 2023-10-22 DIAGNOSIS — F39 Unspecified mood [affective] disorder: Secondary | ICD-10-CM | POA: Diagnosis not present

## 2023-10-22 DIAGNOSIS — E1159 Type 2 diabetes mellitus with other circulatory complications: Secondary | ICD-10-CM

## 2023-10-22 DIAGNOSIS — Z9109 Other allergy status, other than to drugs and biological substances: Secondary | ICD-10-CM

## 2023-10-22 DIAGNOSIS — Z7984 Long term (current) use of oral hypoglycemic drugs: Secondary | ICD-10-CM

## 2023-10-22 DIAGNOSIS — Z6833 Body mass index (BMI) 33.0-33.9, adult: Secondary | ICD-10-CM

## 2023-10-22 MED ORDER — LEVOCETIRIZINE DIHYDROCHLORIDE 5 MG PO TABS
5.0000 mg | ORAL_TABLET | Freq: Every evening | ORAL | 0 refills | Status: DC
Start: 2023-10-22 — End: 2024-02-24

## 2023-10-22 MED ORDER — BUPROPION HCL ER (SR) 200 MG PO TB12: 200.0000 mg | ORAL_TABLET | Freq: Two times a day (BID) | ORAL | 0 refills | Status: DC

## 2023-10-22 NOTE — Progress Notes (Signed)
 Sarah [name], D.O.  ABFM, ABOM Specializing in Clinical Bariatric Medicine  Office located at: [address] [place], [place]  [address]   Assessment and Plan:  No orders of the defined types were placed in this encounter.   Medications Discontinued During This Encounter  Medication Reason   buPROPion  (WELLBUTRIN  SR) 200 MG 12 hr tablet Reorder   levocetirizine (XYZAL ) 5 MG tablet Reorder     Meds ordered this encounter  Medications   levocetirizine (XYZAL ) 5 MG tablet    Sig: Take 1 tablet (5 mg total) by mouth every evening.    Dispense:  90 tablet    Refill:  0   buPROPion  (WELLBUTRIN  SR) 200 MG 12 hr tablet    Sig: Take 1 tablet (200 mg total) by mouth 2 (two) times daily.    Dispense:  180 tablet    Refill:  0      FOR THE DISEASE OF OBESITY: BMI 33.0-33.9,adult -- Current BMI 33.28 Obesity with starting BMI 37.9/date 02/15/2020 Assessment & Plan: Since last office visit on 10/09/23 patient's muscle mass has decreased by 1.6 lb. Fat mass has decreased by 1.6 lb. Total body water  has decreased by 1.4 lb.  Counseling done on how various foods will affect these numbers and how to maximize success  Total lbs lost to date: 28 lbs Total weight loss percentage to date: -12.28%    Recommended Dietary Goals Sarah [name] is currently in the action stage of change. As such, her goal is to continue weight management plan.  She has agreed to: continue current plan  . Behavioral Intervention We discussed the following today: avoiding skipping meals, keeping healthy foods at home, decreasing eating out or consumption of processed foods, and making healthy choices when eating convenient foods, and work on managing stress, creating time for self-care and relaxation  Additional resources provided today: Handout on Daily Food Journaling Log  Evidence-based interventions for health behavior change were utilized today including the discussion of self monitoring techniques,  problem-solving barriers and SMART goal setting techniques.   Regarding patient's less desirable eating habits and patterns, we employed the technique of small changes.   Pt will specifically work on: Not skipping breakfast for next visit.    Recommended Physical Activity Goals Sarah [name] has been advised to work up to 300-450 minutes of moderate intensity aerobic activity a week and strengthening exercises 2-3 times per week for cardiovascular health, weight loss maintenance and preservation of muscle mass.   She has agreed to :  Continue current level of physical activity , Think about enjoyable ways to increase daily physical activity and overcoming barriers to exercise, and Increase physical activity in their day and reduce sedentary time (increase NEAT).   Pharmacotherapy We both agreed to : continue with nutritional and behavioral strategies and continue current medication regimen   ASSOCIATED CONDITIONS ADDRESSED TODAY: Type 2 diabetes mellitus with obesity Sarah [name]) Assessment & Plan: Lab Results  Component Value Date   HGBA1C 6.0 (H) 07/23/2023   HGBA1C 6.4 04/23/2023   HGBA1C 6.3 01/20/2023    Relevant medications: Metformin  500 mg once daily and Mounjaro  15 mg once weekly. Her Hbg A1c improved from 6.4 in 04/2023 to 6.0 in 07/2023. Monitors her sugars; has been averaging high 90s and no readings over 100. Skipping breakfast frequently and reports decreased hunger after eating only a sandwich at lunch. Continue monitoring sugars at home; reviewed the importance of checking sugars even when not eating on plan. If feeling poorly, check  sugars and BP. Encouraged pt to continue with her current exercise routine.    Hypertension associated with diabetes Assessment & Plan: BP Readings from Last 3 Encounters:  10/22/23 136/88  10/21/23 120/70  10/09/23 129/80   BP not quite at goal.  Pt is on Amlodipine  10 mg once daily, Hydralazine  50 mg TID, and Losartan  50 mg daily, Tolerating all  meds well with no side effects. Continue current antihypertensive regimen as prescribed. Encouraged pt to monitor both her BP and sugars if every feeling poorly. Work on eating heart healthy foods via her prudent meal plan.    Depressed mood (HCC) with emotional eating Hypercalcemia Assessment & Plan: Lab Results  Component Value Date   CALCIUM  12.3 (H) 10/21/2023   CALCIUM  11.4 (H) 07/23/2023   CALCIUM  11.7 (H) 05/22/2023   Pt has h/o ovarian cancer in the past. She is established and following up with Dr. Aldona Amel of endocrinology, who is evaluating her for possible thyroid  cancer. Her calcium  levels have been elevated. Current work up is being done by endo care team, which to date has come up negative. Sarah [name] expresses increased stress and health anxiety as a result. She reports poor sleep quality and tends to wake up around 3 am unable to fall back asleep until 5 am. Has been coloring for stress management. Pt is on Wellbutrin  SR 200 mg BID. Tolerating well with no adverse SE.   Encouraged pt to avoid skipping meals and reviewed the importance of eating all her meals as it relates to weight loss and her overall health. Discussed how wt loss may decrease risk for cancers. Avoid high inflammatory foods and processed foods. Reviewed multiple methods of stress management including breathing exercises (4-7-8 method), prayer, and exercise. Continue with Wellbutrin  as prescribed, will refill today. Will continue monitoring condition.   Orders: - Refill Wellbutrin  SR 200 mg BID, no dose changes   Environmental allergies Environmental allergies Assessment & Plan: Pt is on Singulair  and Xyzal  with good compliance and tolerance. No side effects. Overall her symptoms are well controlled. She is requesting a refill on Xyzal  today. PCP already refilled Singulair . Continue to take her allergy meds daily during allergy season or after recent exposure to allergens. Will refill Xyzal  today, no changes made  today.   Orders: - Refill Xyzal , no dose changes    Follow up:    Return in about 4 weeks fro upcoming appt on 6/17 and make additional appt .She was informed of the importance of frequent follow up visits to maximize her success with intensive lifestyle modifications for her multiple health conditions.  Subjective:   Chief complaint: Obesity Sarah [name] is here to discuss her progress with her obesity treatment plan. She is journaling 1300-1400 cal and 85+g protein with the Category 2 MP as a guide and states she is following her eating plan approximately 60% of the time. She states she is doing silver sneakers 50 minutes 3 days per week and walking 30 minutes 2 days per week.  Interval History:  Sarah [name] is here for a follow up office visit. Since last OV on 10/09/23, she has lost 3 lbs. She has not been meeting her intake goals; skipping breakfast. Additionally, she struggles with lunch and will sometimes eat a sandwich and not want to eat anything else afterwards.   Pharmacotherapy for weight loss: She is currently taking Metformin  with diabetes as primary indication with adequate clinical response  and without side effects., Bupropion  (single agent, off label use) with adequate  clinical response  and without side effects., and Monjauro with diabetes as the primary indication with adequate clinical response  and without side effects..   Review of Systems:  Pertinent positives were addressed with patient today.  Reviewed by clinician on day of visit: allergies, medications, problem list, medical history, surgical history, family history, social history, and previous encounter notes.  Weight Summary and Biometrics   Weight Lost Since Last Visit: 3lb  Weight Gained Since Last Visit: 0    Vitals Temp: (!) 97.4 F (36.3 C) BP: 136/88 Pulse Rate: 82 SpO2: 97 %   Anthropometric Measurements Height: 5\' 5"  (1.651 m) Weight: 200 lb (90.7 kg) BMI (Calculated): 33.28 Weight at  Last Visit: 203lb Weight Lost Since Last Visit: 3lb Weight Gained Since Last Visit: 0 Starting Weight: 228lb Total Weight Loss (lbs): 28 lb (12.7 kg) Peak Weight: 228lb   Body Composition  Body Fat %: 39.3 % Fat Mass (lbs): 78.6 lbs Muscle Mass (lbs): 115.2 lbs Total Body Water  (lbs): 73.8 lbs Visceral Fat Rating : 11   Other Clinical Data Fasting: yes Labs: no Today's Visit #: 83 Starting Date: 02/15/20    Objective:   PHYSICAL EXAM: Blood pressure 136/88, pulse 82, temperature (!) 97.4 F (36.3 C), height 5\' 5"  (1.651 m), weight 200 lb (90.7 kg), SpO2 97%. Body mass index is 33.28 kg/m.  General: she is overweight, cooperative and in no acute distress. PSYCH: Has normal mood, affect and thought process.   HEENT: EOMI, sclerae are anicteric. Lungs: Normal breathing effort, no conversational dyspnea. Extremities: Moves * 4 Neurologic: A and O * 3, good insight  DIAGNOSTIC DATA REVIEWED: BMET    Component Value Date/Time   NA 139 10/21/2023 0926   NA 143 07/23/2023 0925   NA 138 04/22/2016 0750   K 4.1 10/21/2023 0926   K 3.9 04/22/2016 0750   CL 104 10/21/2023 0926   CO2 26 10/21/2023 0926   CO2 26 04/22/2016 0750   GLUCOSE 102 (H) 10/21/2023 0926   GLUCOSE 142 (H) 04/22/2016 0750   BUN 17 10/21/2023 0926   BUN 19 07/23/2023 0925   BUN 19.1 11/20/2016 1355   CREATININE 1.10 (H) 10/21/2023 0926   CREATININE 1.1 11/20/2016 1355   CALCIUM  12.3 (H) 10/21/2023 0926   CALCIUM  9.8 04/22/2016 0750   GFRNONAA 47 (L) 03/28/2022 1508   GFRNONAA 48 (L) 12/29/2019 1015   GFRNONAA 63 07/08/2014 1212   GFRAA 69 04/17/2020 0928   GFRAA 56 (L) 12/29/2019 1015   GFRAA 73 07/08/2014 1212   Lab Results  Component Value Date   HGBA1C 6.0 (H) 07/23/2023   HGBA1C 10.1 (H) 07/07/2015   No results found for: "INSULIN " Lab Results  Component Value Date   TSH 1.16 10/21/2023   CBC    Component Value Date/Time   WBC 5.4 03/28/2022 1508   RBC 4.97 03/28/2022 1508    HGB 15.0 03/28/2022 1508   HGB 13.3 02/03/2020 0837   HGB 13.6 12/04/2018 0914   HGB 12.9 04/22/2016 0750   HCT 46.8 (H) 03/28/2022 1508   HCT 41.0 12/04/2018 0914   HCT 39.0 04/22/2016 0750   PLT 318 03/28/2022 1508   PLT 270 02/03/2020 0837   PLT 332 12/04/2018 0914   MCV 94.2 03/28/2022 1508   MCV 88 12/04/2018 0914   MCV 92.2 04/22/2016 0750   MCH 30.2 03/28/2022 1508   MCHC 32.1 03/28/2022 1508   RDW 13.6 03/28/2022 1508   RDW 13.6 12/04/2018 0914   RDW 13.9  04/22/2016 0750   Iron Studies    Component Value Date/Time   IRON 43 08/18/2010 0515   TIBC 389 08/18/2010 0515   FERRITIN 227 08/18/2010 0515   IRONPCTSAT 11 (L) 08/18/2010 0515   Lipid Panel     Component Value Date/Time   CHOL 161 07/23/2023 0925   TRIG 162 (H) 07/23/2023 0925   HDL 47 07/23/2023 0925   CHOLHDL 3.4 07/23/2023 0925   CHOLHDL 5.1 (H) 08/14/2016 1729   VLDL 74 (H) 08/14/2016 1729   LDLCALC 86 07/23/2023 0925   Hepatic Function Panel     Component Value Date/Time   PROT 7.9 10/21/2023 0926   PROT 7.3 07/23/2023 0925   PROT 8.2 04/22/2016 0750   ALBUMIN 4.6 07/23/2023 0925   ALBUMIN 3.8 04/22/2016 0750   AST 26 10/21/2023 0926   AST 29 12/29/2019 1015   AST 27 04/22/2016 0750   ALT 24 10/21/2023 0926   ALT 33 12/29/2019 1015   ALT 24 04/22/2016 0750   ALKPHOS 82 07/23/2023 0925   ALKPHOS 74 04/22/2016 0750   BILITOT 0.6 10/21/2023 0926   BILITOT 0.5 07/23/2023 0925   BILITOT 0.6 12/29/2019 1015   BILITOT 0.44 04/22/2016 0750   BILIDIR 0.2 08/18/2010 0515   IBILI 1.5 (H) 08/18/2010 0515      Component Value Date/Time   TSH 1.16 10/21/2023 0926   Nutritional Lab Results  Component Value Date   VD25OH 38 10/21/2023   VD25OH 33.96 04/23/2023   VD25OH 29.26 (L) 10/21/2022    Attestations:   Cherryl Corona, acting as a medical scribe for Marceil Sensor, DO., have compiled all relevant documentation for today's office visit on behalf of Marceil Sensor, DO, while in  the presence of [employer], DO.  Reviewed by clinician on day of visit: allergies, medications, problem list, medical history, surgical history, family history, social history, and previous encounter notes pertinent to patient's obesity diagnosis.  I have reviewed the above documentation for accuracy and completeness, and I agree with the above. Sarah [name], D.O.  The 21st Century Cures Act was signed into law in 2016 which includes the topic of electronic health records.  This provides immediate access to information in MyChart.  This includes consultation notes, operative notes, office notes, lab results and pathology reports.  If you have any questions about what you read please let us  know at your next visit so we can discuss your concerns and take corrective action if need be.  We are right here with you.

## 2023-10-24 ENCOUNTER — Other Ambulatory Visit

## 2023-10-24 DIAGNOSIS — E042 Nontoxic multinodular goiter: Secondary | ICD-10-CM | POA: Diagnosis not present

## 2023-10-24 LAB — COMPREHENSIVE METABOLIC PANEL WITH GFR
AG Ratio: 1.5 (calc) (ref 1.0–2.5)
ALT: 24 U/L (ref 6–29)
AST: 26 U/L (ref 10–35)
Albumin: 4.7 g/dL (ref 3.6–5.1)
Alkaline phosphatase (APISO): 74 U/L (ref 37–153)
BUN/Creatinine Ratio: 15 (calc) (ref 6–22)
BUN: 17 mg/dL (ref 7–25)
CO2: 26 mmol/L (ref 20–32)
Calcium: 12.3 mg/dL — ABNORMAL HIGH (ref 8.6–10.4)
Chloride: 104 mmol/L (ref 98–110)
Creat: 1.1 mg/dL — ABNORMAL HIGH (ref 0.50–1.03)
Globulin: 3.2 g/dL (ref 1.9–3.7)
Glucose, Bld: 102 mg/dL — ABNORMAL HIGH (ref 65–99)
Potassium: 4.1 mmol/L (ref 3.5–5.3)
Sodium: 139 mmol/L (ref 135–146)
Total Bilirubin: 0.6 mg/dL (ref 0.2–1.2)
Total Protein: 7.9 g/dL (ref 6.1–8.1)
eGFR: 59 mL/min/{1.73_m2} — ABNORMAL LOW (ref >=60)

## 2023-10-24 LAB — PARATHYROID HORMONE, INTACT (NO CA): PTH: 41 pg/mL (ref 16–77)

## 2023-10-24 LAB — CALCITONIN: Calcitonin: <2 pg/mL (ref <=5)

## 2023-10-24 LAB — TSH: TSH: 1.16 m[IU]/L (ref 0.40–4.50)

## 2023-10-24 LAB — VITAMIN D 25 HYDROXY (VIT D DEFICIENCY, FRACTURES): Vit D, 25-Hydroxy: 38 ng/mL (ref 30–100)

## 2023-10-25 LAB — CALCIUM, URINE, 24 HOUR: Calcium, 24H Urine: 78 mg/(24.h)

## 2023-10-25 LAB — CREATININE, URINE, 24 HOUR: Creatinine, 24H Ur: 1.48 g/(24.h) (ref 0.50–2.15)

## 2023-10-28 ENCOUNTER — Inpatient Hospital Stay: Admission: RE | Admit: 2023-10-28 | Source: Ambulatory Visit

## 2023-10-30 ENCOUNTER — Ambulatory Visit
Admission: RE | Admit: 2023-10-30 | Discharge: 2023-10-30 | Disposition: A | Discharge status: Home / Self Care | Source: Ambulatory Visit | Attending: Internal Medicine | Admitting: Internal Medicine

## 2023-10-30 DIAGNOSIS — E042 Nontoxic multinodular goiter: Secondary | ICD-10-CM

## 2023-10-30 DIAGNOSIS — Z9889 Other specified postprocedural states: Secondary | ICD-10-CM | POA: Diagnosis not present

## 2023-11-08 ENCOUNTER — Other Ambulatory Visit (INDEPENDENT_AMBULATORY_CARE_PROVIDER_SITE_OTHER): Payer: Self-pay | Admitting: Family Medicine

## 2023-11-08 ENCOUNTER — Other Ambulatory Visit: Payer: Self-pay | Admitting: Internal Medicine

## 2023-11-08 DIAGNOSIS — Z9109 Other allergy status, other than to drugs and biological substances: Secondary | ICD-10-CM

## 2023-11-08 DIAGNOSIS — I152 Hypertension secondary to endocrine disorders: Secondary | ICD-10-CM

## 2023-11-08 DIAGNOSIS — E1169 Type 2 diabetes mellitus with other specified complication: Secondary | ICD-10-CM

## 2023-11-08 DIAGNOSIS — N3946 Mixed incontinence: Secondary | ICD-10-CM

## 2023-11-16 ENCOUNTER — Other Ambulatory Visit (INDEPENDENT_AMBULATORY_CARE_PROVIDER_SITE_OTHER): Payer: Self-pay | Admitting: Family Medicine

## 2023-11-16 DIAGNOSIS — K219 Gastro-esophageal reflux disease without esophagitis: Secondary | ICD-10-CM

## 2023-11-18 ENCOUNTER — Encounter: Payer: Self-pay | Admitting: Internal Medicine

## 2023-11-19 ENCOUNTER — Telehealth: Payer: Self-pay | Admitting: *Deleted

## 2023-11-20 ENCOUNTER — Other Ambulatory Visit: Payer: Self-pay | Admitting: Internal Medicine

## 2023-11-20 DIAGNOSIS — G8929 Other chronic pain: Secondary | ICD-10-CM

## 2023-11-20 NOTE — Telephone Encounter (Signed)
 Requested medication (s) are due for refill today: yes  Requested medication (s) are on the active medication list: yes  Last refill:  06/23/23  Future visit scheduled: no  Notes to clinic:  Unable to refill per protocol due to failed labs, no updated results.      Requested Prescriptions  Pending Prescriptions Disp Refills   lidocaine  (LIDODERM ) 5 % [Pharmacy Med Name: Lidocaine  External Patch 5 %] 90 patch 3    Sig: APPLY 1 PATCH ONTO THE SKIN DAILY. REMOVE AND DISCARD PATCH WITHIN 12 HOURS OR AS DIRECTED BY MD     Analgesics:  Topicals Failed - 11/20/2023  9:27 AM      Failed - Manual Review: Labs are only required if the patient has taken medication for more than 8 weeks.      Failed - PLT in normal range and within 360 days    Platelets  Date Value Ref Range Status  03/28/2022 318 150 - 400 K/uL Final  12/04/2018 332 150 - 450 x10E3/uL Final   Platelet Count  Date Value Ref Range Status  02/03/2020 270 150 - 400 K/uL Final         Failed - HGB in normal range and within 360 days    Hemoglobin  Date Value Ref Range Status  03/28/2022 15.0 12.0 - 15.0 g/dL Final  16/03/9603 54.0 12.0 - 15.0 g/dL Final  98/04/9146 82.9 11.1 - 15.9 g/dL Final   HGB  Date Value Ref Range Status  04/22/2016 12.9 11.6 - 15.9 g/dL Final         Failed - HCT in normal range and within 360 days    HCT  Date Value Ref Range Status  03/28/2022 46.8 (H) 36.0 - 46.0 % Final  04/22/2016 39.0 34.8 - 46.6 % Final   Hematocrit  Date Value Ref Range Status  12/04/2018 41.0 34.0 - 46.6 % Final         Failed - Cr in normal range and within 360 days    Creatinine  Date Value Ref Range Status  11/20/2016 1.1 0.6 - 1.1 mg/dL Final   Creat  Date Value Ref Range Status  10/21/2023 1.10 (H) 0.50 - 1.03 mg/dL Final   Creatinine, POC  Date Value Ref Range Status  11/13/2016 100 mg/dL Final   Creatinine, Urine  Date Value Ref Range Status  07/27/2015 174 20 - 320 mg/dL Final          Passed - eGFR is 30 or above and within 360 days    GFR, Est African American  Date Value Ref Range Status  07/08/2014 73 mL/min Final   GFR, Est AFR Am  Date Value Ref Range Status  12/29/2019 56 (L) >60 mL/min Final   GFR calc Af Amer  Date Value Ref Range Status  04/17/2020 69 >59 mL/min/1.73 Final    Comment:    **In accordance with recommendations from the NKF-ASN Task force,**   [facility] is in the process of updating its eGFR calculation to the   2021 CKD-EPI creatinine equation that estimates kidney function   without a race variable.    GFR, Est Non African American  Date Value Ref Range Status  07/08/2014 63 mL/min Final    Comment:      The estimated GFR is a calculation valid for adults (>=8 years old) that uses the CKD-EPI algorithm to adjust for age and sex. It is   not to be used for children, pregnant women, hospitalized patients,  patients on dialysis, or with rapidly changing kidney function. According to the NKDEP, eGFR >89 is normal, 60-89 shows mild impairment, 30-59 shows moderate impairment, 15-29 shows severe impairment and <15 is ESRD.      GFR, Estimated  Date Value Ref Range Status  03/28/2022 47 (L) >60 mL/min Final    Comment:    (NOTE) Calculated using the CKD-EPI Creatinine Equation (2021)   12/29/2019 48 (L) >60 mL/min Final   eGFR  Date Value Ref Range Status  10/21/2023 59 (L) > OR = 60 mL/min/1.23m2 Final  07/23/2023 46 (L) >59 mL/min/1.73 Final         Passed - Patient is not pregnant      Passed - Valid encounter within last 12 months    Recent Outpatient Visits           1 month ago Type 2 diabetes mellitus with obesity (HCC)   [facility] Dept Of [place]. [facility] Concetta Dee B, MD   6 months ago Type 2 diabetes mellitus with obesity [facility])   [facility] - A Dept Of [place]. [facility] Concetta Dee B, MD   10 months ago Type 2 diabetes  mellitus with obesity [facility])   [facility] - A Dept Of [place]. [facility] Lawrance Presume, MD   1 year ago Type 2 diabetes mellitus with obesity [facility])   [facility] - A Dept Of [place]. [facility] Lawrance Presume, MD   1 year ago Chronic bilateral thoracic back pain   [facility] - A Dept Of [place]. [facility] Lawrance Presume, MD

## 2023-11-21 ENCOUNTER — Other Ambulatory Visit: Payer: Self-pay | Admitting: Pharmacist

## 2023-11-21 MED ORDER — ACCU-CHEK GUIDE W/DEVICE KIT: PACK | 0 refills | Status: AC

## 2023-11-25 ENCOUNTER — Ambulatory Visit (INDEPENDENT_AMBULATORY_CARE_PROVIDER_SITE_OTHER): Admitting: Family Medicine

## 2023-11-25 ENCOUNTER — Encounter (INDEPENDENT_AMBULATORY_CARE_PROVIDER_SITE_OTHER): Payer: Self-pay | Admitting: Family Medicine

## 2023-11-25 VITALS — BP 136/83 | HR 80 | Temp 98.2°F | Ht 65.0 in | Wt 191.0 lb

## 2023-11-25 DIAGNOSIS — Z6831 Body mass index (BMI) 31.0-31.9, adult: Secondary | ICD-10-CM | POA: Diagnosis not present

## 2023-11-25 DIAGNOSIS — Z7984 Long term (current) use of oral hypoglycemic drugs: Secondary | ICD-10-CM | POA: Diagnosis not present

## 2023-11-25 DIAGNOSIS — E669 Obesity, unspecified: Secondary | ICD-10-CM

## 2023-11-25 DIAGNOSIS — E1169 Type 2 diabetes mellitus with other specified complication: Secondary | ICD-10-CM | POA: Diagnosis not present

## 2023-11-25 DIAGNOSIS — Z7985 Long-term (current) use of injectable non-insulin antidiabetic drugs: Secondary | ICD-10-CM | POA: Diagnosis not present

## 2023-11-25 DIAGNOSIS — F39 Unspecified mood [affective] disorder: Secondary | ICD-10-CM | POA: Diagnosis not present

## 2023-11-25 DIAGNOSIS — F5089 Other specified eating disorder: Secondary | ICD-10-CM

## 2023-11-25 DIAGNOSIS — Z6834 Body mass index (BMI) 34.0-34.9, adult: Secondary | ICD-10-CM

## 2023-11-25 DIAGNOSIS — K219 Gastro-esophageal reflux disease without esophagitis: Secondary | ICD-10-CM | POA: Diagnosis not present

## 2023-11-25 MED ORDER — OMEPRAZOLE 20 MG PO CPDR: 20.0000 mg | DELAYED_RELEASE_CAPSULE | Freq: Every day | ORAL | 0 refills | Status: DC

## 2023-11-25 NOTE — Progress Notes (Incomplete)
 Sarah [name], D.O.  ABFM, ABOM Specializing in Clinical Bariatric Medicine  Office located at: [address] [place], [place]  [address]   Assessment and Plan:   Medications Discontinued During This Encounter  Medication Reason   omeprazole  (PRILOSEC) 20 MG capsule Reorder     Meds ordered this encounter  Medications   omeprazole  (PRILOSEC) 20 MG capsule    Sig: Take 1 capsule (20 mg total) by mouth daily.    Dispense:  90 capsule    Refill:  0    Future refills per GI doc- [place]     FOR THE DISEASE OF OBESITY:  BMI 34.0-34.9,adult- current BMI 31.78 Obesity with starting BMI 37.9/date 02/15/2020 Assessment & Plan: Since last office visit on 10/22/2023 patient's  Muscle mass has decreased by 3.8 lb. Fat mass has decreased by 5 lb. Total body water  has decreased by 3.6 lb.  Counseling done on how various foods will affect these numbers and how to maximize success  Total lbs lost to date: 37 lbs  Total weight loss percentage to date: 16.23%    Recommended Dietary Goals Sarah [name] is currently in the action stage of change. As such, her goal is to continue weight management plan.  She has agreed to  continue current plan   Behavioral Intervention We discussed the following today: increasing lean protein intake to established goals, avoiding skipping meals, and work on managing stress, creating time for self-care and relaxation  Additional resources provided today: None  Evidence-based interventions for health behavior change were utilized today including the discussion of self monitoring techniques, problem-solving barriers and SMART goal setting techniques.   Regarding patient's less desirable eating habits and patterns, we employed the technique of small changes.   Pt will specifically work on doing 4-7-8 breathing 10 minutes twice daily and walking 15 minutes daily in nature for stress management.    Recommended Physical Activity Goals Sarah [name] has been advised  to work up to 300-450 minutes of moderate intensity aerobic activity a week and strengthening exercises 2-3 times per week for cardiovascular health, weight loss maintenance and preservation of muscle mass.   She has agreed to : continue to gradually increase the amount and intensity of exercise routine   Pharmacotherapy We both agreed to  continue same regimen.    ASSOCIATED CONDITIONS ADDRESSED TODAY:   Type 2 diabetes mellitus with obesity [facility]) Assessment & Plan: Lab Results  Component Value Date   HGBA1C 6.0 (H) 07/23/2023   HGBA1C 6.4 04/23/2023   HGBA1C 6.3 01/20/2023   Denies symptoms of hypoglycemia or hyperglycemia. States her fasting blood sugars are nothing over 100; her 2-hour PPBS averages 153. On a regimen of Mounjaro  15 mg wkly and Metformin  500 mg daily with good adherence and no side effects. Good control of hunger and cravings.   Continue current regimen and journaling plan. Advance exercise as able.     Depressed mood (HCC) with emotional eating Assessment & Plan: Relevant medications: Cymbalta   20 mg daily and Wellbutrin  SR 200 mg twice daily.   Denies HI/SI. Notes increased stress this past month. Has been skipping meals sometimes because of the stress. For stress management, she prays, watches christian videos, speaks with her mom and sister, and sometimes goes to the park.   Continue medicines. Discussed the potential benefits of 4-7-8 breathing; encouraged her to try it 10 minutes twice daily. Also recommend she spend more time exercising (eg walking) in nature. Explained the detriments of  skipping meals and  recommend she adhere to her journaling parameters to the best of her ability.     Gastroesophageal reflux disease, unspecified whether esophagitis present Assessment & Plan: Symptoms are essentially controlled on Omeprazole  20 mg daily. Continue regimen and not eating within 2-3 hrs of lying down, avoiding trigger foods, and decreasing caffeine  intake.     Follow up:   Return in about 3 weeks (around 12/16/2023). She was informed of the importance of frequent follow up visits to maximize her success with intensive lifestyle modifications for her multiple health conditions.   Subjective:   Chief complaint: Obesity Sarah [name] is here to discuss her progress with her obesity treatment plan. She is journaling 1300-1400 cal and 85+g protein with the Category 2 MP as a guide and states she is following her eating plan approximately 70% of the time. She states she is going to the gym and walking 50 minutes 3 days per week.   Interval History:  Sarah [name] is here for a follow up office visit. Since last OV on 10/22/2023, Sarah [name] is down 9 lbs. Notes increased stress this past month. Has been skipping meals sometimes because of the stress. For stress management, she prays, watches christian videos, speaks with her mom and sister, and sometimes goes to the park.    Pharmacotherapy that aid with weight loss: She is currently taking Metformin  500 mg daily,  Mounjaro  15 mg wkly, and Wellbutrin  SR 200 mg twice daily.   Review of Systems:  Pertinent positives were addressed with patient today.  Reviewed by clinician on day of visit: allergies, medications, problem list, medical history, surgical history, family history, social history, and previous encounter notes.  Weight Summary and Biometrics   Weight Lost Since Last Visit: 9lb  Weight Gained Since Last Visit: 0lb   Vitals Temp: 98.2 F (36.8 C) BP: 136/83 Pulse Rate: 80 SpO2: 98 %   Anthropometric Measurements Height: 5' 5 (1.651 m) Weight: 191 lb (86.6 kg) BMI (Calculated): 31.78 Weight at Last Visit: 200lb Weight Lost Since Last Visit: 9lb Weight Gained Since Last Visit: 0lb Starting Weight: 228lb Total Weight Loss (lbs): 37 lb (16.8 kg) Peak Weight: 228lb   Body Composition  Body Fat %: 38.5 % Fat Mass (lbs): 73.6 lbs Muscle Mass (lbs): 111.4 lbs Total  Body Water  (lbs): 70.2 lbs Visceral Fat Rating : 10   Other Clinical Data Fasting: No Labs: No Today's Visit #: 84 Starting Date: 02/15/20   Objective:   PHYSICAL EXAM: Blood pressure 136/83, pulse 80, temperature 98.2 F (36.8 C), height 5' 5 (1.651 m), weight 191 lb (86.6 kg), SpO2 98%. Body mass index is 31.78 kg/m.  General: she is overweight, cooperative and in no acute distress. PSYCH: Has normal mood, affect and thought process.   HEENT: EOMI, sclerae are anicteric. Lungs: Normal breathing effort, no conversational dyspnea. Extremities: Moves * 4 Neurologic: A and O * 3, good insight  DIAGNOSTIC DATA REVIEWED: BMET    Component Value Date/Time   NA 139 10/21/2023 0926   NA 143 07/23/2023 0925   NA 138 04/22/2016 0750   K 4.1 10/21/2023 0926   K 3.9 04/22/2016 0750   CL 104 10/21/2023 0926   CO2 26 10/21/2023 0926   CO2 26 04/22/2016 0750   GLUCOSE 102 (H) 10/21/2023 0926   GLUCOSE 142 (H) 04/22/2016 0750   BUN 17 10/21/2023 0926   BUN 19 07/23/2023 0925   BUN 19.1 11/20/2016 1355   CREATININE 1.10 (H) 10/21/2023 0926   CREATININE 1.1  11/20/2016 1355   CALCIUM  12.3 (H) 10/21/2023 0926   CALCIUM  9.8 04/22/2016 0750   GFRNONAA 47 (L) 03/28/2022 1508   GFRNONAA 48 (L) 12/29/2019 1015   GFRNONAA 63 07/08/2014 1212   GFRAA 69 04/17/2020 0928   GFRAA 56 (L) 12/29/2019 1015   GFRAA 73 07/08/2014 1212   Lab Results  Component Value Date   HGBA1C 6.0 (H) 07/23/2023   HGBA1C 10.1 (H) 07/07/2015   No results found for: INSULIN  Lab Results  Component Value Date   TSH 1.16 10/21/2023   CBC    Component Value Date/Time   WBC 5.4 03/28/2022 1508   RBC 4.97 03/28/2022 1508   HGB 15.0 03/28/2022 1508   HGB 13.3 02/03/2020 0837   HGB 13.6 12/04/2018 0914   HGB 12.9 04/22/2016 0750   HCT 46.8 (H) 03/28/2022 1508   HCT 41.0 12/04/2018 0914   HCT 39.0 04/22/2016 0750   PLT 318 03/28/2022 1508   PLT 270 02/03/2020 0837   PLT 332 12/04/2018 0914   MCV  94.2 03/28/2022 1508   MCV 88 12/04/2018 0914   MCV 92.2 04/22/2016 0750   MCH 30.2 03/28/2022 1508   MCHC 32.1 03/28/2022 1508   RDW 13.6 03/28/2022 1508   RDW 13.6 12/04/2018 0914   RDW 13.9 04/22/2016 0750   Iron Studies    Component Value Date/Time   IRON 43 08/18/2010 0515   TIBC 389 08/18/2010 0515   FERRITIN 227 08/18/2010 0515   IRONPCTSAT 11 (L) 08/18/2010 0515   Lipid Panel     Component Value Date/Time   CHOL 161 07/23/2023 0925   TRIG 162 (H) 07/23/2023 0925   HDL 47 07/23/2023 0925   CHOLHDL 3.4 07/23/2023 0925   CHOLHDL 5.1 (H) 08/14/2016 1729   VLDL 74 (H) 08/14/2016 1729   LDLCALC 86 07/23/2023 0925   Hepatic Function Panel     Component Value Date/Time   PROT 7.9 10/21/2023 0926   PROT 7.3 07/23/2023 0925   PROT 8.2 04/22/2016 0750   ALBUMIN 4.6 07/23/2023 0925   ALBUMIN 3.8 04/22/2016 0750   AST 26 10/21/2023 0926   AST 29 12/29/2019 1015   AST 27 04/22/2016 0750   ALT 24 10/21/2023 0926   ALT 33 12/29/2019 1015   ALT 24 04/22/2016 0750   ALKPHOS 82 07/23/2023 0925   ALKPHOS 74 04/22/2016 0750   BILITOT 0.6 10/21/2023 0926   BILITOT 0.5 07/23/2023 0925   BILITOT 0.6 12/29/2019 1015   BILITOT 0.44 04/22/2016 0750   BILIDIR 0.2 08/18/2010 0515   IBILI 1.5 (H) 08/18/2010 0515      Component Value Date/Time   TSH 1.16 10/21/2023 0926   Nutritional Lab Results  Component Value Date   VD25OH 38 10/21/2023   VD25OH 33.96 04/23/2023   VD25OH 29.26 (L) 10/21/2022    Attestations:   I, Special Puri , acting as a Stage manager for Marceil Sensor, DO., have compiled all relevant documentation for today's office visit on behalf of Marceil Sensor, DO, while in the presence of [employer], DO.  I have reviewed the above documentation for accuracy and completeness, and I agree with the above. Sarah [name], D.O.  The 21st Century Cures Act was signed into law in 2016 which includes the topic of electronic health records.  This provides  immediate access to information in MyChart.  This includes consultation notes, operative notes, office notes, lab results and pathology reports.  If you have any questions about what you read please let us   know at your next visit so we can discuss your concerns and take corrective action if need be.  We are right here with you.

## 2023-11-27 ENCOUNTER — Other Ambulatory Visit: Payer: Self-pay | Admitting: Internal Medicine

## 2023-11-27 NOTE — Telephone Encounter (Signed)
 Created in error

## 2023-12-10 ENCOUNTER — Ambulatory Visit (INDEPENDENT_AMBULATORY_CARE_PROVIDER_SITE_OTHER): Admitting: Family Medicine

## 2023-12-24 ENCOUNTER — Encounter: Payer: Self-pay | Admitting: Internal Medicine

## 2023-12-24 ENCOUNTER — Other Ambulatory Visit (HOSPITAL_COMMUNITY): Payer: Self-pay

## 2023-12-24 ENCOUNTER — Ambulatory Visit (INDEPENDENT_AMBULATORY_CARE_PROVIDER_SITE_OTHER): Admitting: Family Medicine

## 2023-12-24 ENCOUNTER — Encounter (INDEPENDENT_AMBULATORY_CARE_PROVIDER_SITE_OTHER): Payer: Self-pay | Admitting: Family Medicine

## 2023-12-24 VITALS — BP 104/69 | HR 89 | Temp 98.1°F | Ht 65.0 in | Wt 190.0 lb

## 2023-12-24 DIAGNOSIS — Z7985 Long-term (current) use of injectable non-insulin antidiabetic drugs: Secondary | ICD-10-CM | POA: Diagnosis not present

## 2023-12-24 DIAGNOSIS — E669 Obesity, unspecified: Secondary | ICD-10-CM

## 2023-12-24 DIAGNOSIS — K219 Gastro-esophageal reflux disease without esophagitis: Secondary | ICD-10-CM | POA: Diagnosis not present

## 2023-12-24 DIAGNOSIS — Z6831 Body mass index (BMI) 31.0-31.9, adult: Secondary | ICD-10-CM | POA: Diagnosis not present

## 2023-12-24 DIAGNOSIS — E1169 Type 2 diabetes mellitus with other specified complication: Secondary | ICD-10-CM | POA: Diagnosis not present

## 2023-12-24 DIAGNOSIS — F39 Unspecified mood [affective] disorder: Secondary | ICD-10-CM | POA: Diagnosis not present

## 2023-12-24 DIAGNOSIS — Z7984 Long term (current) use of oral hypoglycemic drugs: Secondary | ICD-10-CM

## 2023-12-24 DIAGNOSIS — E1122 Type 2 diabetes mellitus with diabetic chronic kidney disease: Secondary | ICD-10-CM | POA: Diagnosis not present

## 2023-12-24 DIAGNOSIS — E66811 Obesity, class 1: Secondary | ICD-10-CM

## 2023-12-24 DIAGNOSIS — N1831 Chronic kidney disease, stage 3a: Secondary | ICD-10-CM

## 2023-12-24 DIAGNOSIS — F5089 Other specified eating disorder: Secondary | ICD-10-CM | POA: Diagnosis not present

## 2023-12-24 MED ORDER — TIRZEPATIDE 15 MG/0.5ML ~~LOC~~ SOAJ
15.0000 mg | SUBCUTANEOUS | 0 refills | Status: DC
  Filled 2023-12-24: qty 6, 84d supply, fill #0

## 2023-12-24 MED ORDER — TIRZEPATIDE 15 MG/0.5ML ~~LOC~~ SOAJ
15.0000 mg | SUBCUTANEOUS | 0 refills | Status: DC
Start: 2023-12-24 — End: 2023-12-24

## 2023-12-24 NOTE — Progress Notes (Signed)
 Sarah [name], D.O.  ABFM, ABOM Specializing in Clinical Bariatric Medicine  Office located at: [address] [place], [place]  [address]   Assessment and Plan:   Medications Discontinued During This Encounter  Medication Reason   ibuprofen  (ADVIL ) 600 MG tablet    tirzepatide  (MOUNJARO ) 15 MG/0.5ML Pen Reorder   tirzepatide  (MOUNJARO ) 15 MG/0.5ML Pen     Meds ordered this encounter  Medications   DISCONTD: tirzepatide  (MOUNJARO ) 15 MG/0.5ML Pen    Sig: Inject 15 mg into the skin once a week every Thursday    Dispense:  6 mL    Refill:  0   tirzepatide  (MOUNJARO ) 15 MG/0.5ML Pen    Sig: Inject 15 mg into the skin once a week every Thursday    Dispense:  6 mL    Refill:  0     FOR THE DISEASE OF OBESITY: Obesity with starting BMI 37.9/date 02/15/2020 Obesity (BMI 30.0-34.9) current 31.62 Assessment & Plan: Since last office visit on 11/25/23 patient's muscle mass has decreased by 2 lbs. Fat mass has increased by 1.2 lbs. Total body water  has decreased by 3 lbs.  Counseling done on how various foods will affect these numbers and how to maximize success  Total lbs lost to date: 38 lbs Total weight loss percentage to date: -16.67 %   Recommended Dietary Goals Bradie is currently in the action stage of change. As such, her goal is to continue weight management plan.  She has agreed to: continue current plan   Behavioral Intervention We discussed the following today: increasing lean protein intake to established goals, decreasing simple carbohydrates , avoiding skipping meals, increasing water  intake , work on tracking and journaling calories using tracking application, and keeping healthy foods at home  Additional resources provided today: Handout on Daily Food Journaling Log  Evidence-based interventions for health behavior change were utilized today including the discussion of self monitoring techniques, problem-solving barriers and SMART goal setting techniques.    Regarding patient's less desirable eating habits and patterns, we employed the technique of small changes.   Pt will specifically work on: Journaling her daily calorie and protein intake    Recommended Physical Activity Goals Ayeza has been advised to work up to 300-450 minutes of moderate intensity aerobic activity a week and strengthening exercises 2-3 times per week for cardiovascular health, weight loss maintenance and preservation of muscle mass.   She has agreed to: Continue current level of physical activity  and consider adding strength training.    Pharmacotherapy We both agreed to: Continue with current nutritional and behavioral strategies and continue current medication regimen that aids with weight loss (Wellbutrin , Metformin , and Mounjaro ).    ASSOCIATED CONDITIONS ADDRESSED TODAY:  Type 2 diabetes mellitus with obesity [facility]) Assessment & Plan: Lab Results  Component Value Date   HGBA1C 6.0 (H) 07/23/2023   HGBA1C 6.4 04/23/2023   HGBA1C 6.3 01/20/2023    BP Readings from Last 3 Encounters:  12/24/23 104/69  11/25/23 136/83  10/22/23 136/88   Compliant with Metformin  500 mg once daily an Mounjaro  15 mg once weekly (Thursdays). Fasting sugars range from 59-100 and average in mid-high 60s. BP was stable today. Pt completely asx; no dizziness or hypotensive sx.   Continue monitoring sugars. Reminded pt to check BP and sugars if she feels poorly (hypo/hyperglycemic or hypotensive). Continue current med regimen as prescribed. Refilling Mounjaro .     Depressed mood (HCC) with emotional eating Assessment & Plan: Currently on Wellbutrin  SR 200  mg BID. Good compliance/tolerance. No SE. Moods are stable and well controlled. Doing breathing exercises. Good stress management, stress has improved. Exercising has also improved moods.   Continue exercising and doing meditation/breathing exercises for stress management. Continue Wellbutrin  as prescribed.     Stage 3a  chronic kidney disease (CKD) (HCC) Assessment & Plan: Lab Results  Component Value Date   NA 139 10/21/2023   CL 104 10/21/2023   K 4.1 10/21/2023   CO2 26 10/21/2023   BUN 17 10/21/2023   CREATININE 1.10 (H) 10/21/2023   EGFR 59 (L) 10/21/2023   CALCIUM  12.3 (H) 10/21/2023   PHOS 2.9 04/23/2023   ALBUMIN 4.6 07/23/2023   GLUCOSE 102 (H) 10/21/2023   Reviewed kidney labs: serum creatinine was above goal. Pt states she is not drinking enough water  throughout the day.   Stay well hydrated; increase daily water  intake. Counseling provided on how better control of sugars and blood pressure helps decrease risk of kidney damage/disease. Will continue to monitor condition alongside PCP.     Gastroesophageal reflux disease, unspecified whether esophagitis present Assessment & Plan: Reflux sx were previously exasperated and we started her on Prilosec once daily. She has been compliant and notes her sx have overall improved.   Pt to discuss with her GI care team to assess whether it is recommended she increase to BID. Avoid known trigger foods. Wil continue monitoring alongside specialist/PCP.     Follow up:   Return for Keep upcoming appt 01/07/2024 10:20 AM, make another appt 2-4 weeks after if pt desires. .  She was informed of the importance of frequent follow up visits to maximize her success with intensive lifestyle modifications for her multiple health conditions.  Subjective:   Chief complaint: Obesity Jaylanni is here to discuss her progress with her obesity treatment plan. She is journaling 1300-1400 cal and 85+g protein with the Category 2 MP as a guide and states she is following her eating plan approximately 70% of the time. She states she is going to the [facility] for 50 minute classes 3 days per week and is walking 30 minutes 2 days per week.   Interval History:  ESTI DEMELLO is here for a follow up office visit. Since last OV on 11/25/23, she is down 1 lb. She ate chicken and  lamb chops during 4th of July celebrations. Clothes is fitting her loosely w recent wt loss. Has been eating more meals at home. Has reduced the frequency of eating out. Has not been journaling.  Pharmacotherapy that aid with weight loss: She is currently taking Wellbutrin  SR 200 mg BID, Metformin  500 mg once daily, and Mounjaro  15 mg once weekly (Thursdays).   Review of Systems:  Pertinent positives were addressed with patient today.  Reviewed by clinician on day of visit: allergies, medications, problem list, medical history, surgical history, family history, social history, and previous encounter notes.  Weight Summary and Biometrics   Weight Lost Since Last Visit: 1lb  Weight Gained Since Last Visit: 0lb    Vitals Temp: 98.1 F (36.7 C) BP: 104/69 Pulse Rate: 89 SpO2: 99 %   Anthropometric Measurements Height: 5' 5 (1.651 m) Weight: 190 lb (86.2 kg) BMI (Calculated): 31.62 Weight at Last Visit: 191lb Weight Lost Since Last Visit: 1lb Weight Gained Since Last Visit: 0lb Starting Weight: 228lb Total Weight Loss (lbs): 38 lb (17.2 kg) Peak Weight: 228lb   Body Composition  Body Fat %: 39.4 % Fat Mass (lbs): 74.8 lbs Muscle Mass (lbs): 109.4  lbs Total Body Water  (lbs): 73.2 lbs Visceral Fat Rating : 10   Other Clinical Data Fasting: Yes Labs: No Today's Visit #: 85 Starting Date: 02/15/20 Comments: Cat 2/j 1300-1400/85+    Objective:   PHYSICAL EXAM: Blood pressure 104/69, pulse 89, temperature 98.1 F (36.7 C), height 5' 5 (1.651 m), weight 190 lb (86.2 kg), SpO2 99%. Body mass index is 31.62 kg/m.  General: she is overweight, cooperative and in no acute distress. PSYCH: Has normal mood, affect and thought process.   HEENT: EOMI, sclerae are anicteric. Lungs: Normal breathing effort, no conversational dyspnea. Extremities: Moves * 4 Neurologic: A and O * 3, good insight  DIAGNOSTIC DATA REVIEWED: BMET    Component Value Date/Time   NA 139  10/21/2023 0926   NA 143 07/23/2023 0925   NA 138 04/22/2016 0750   K 4.1 10/21/2023 0926   K 3.9 04/22/2016 0750   CL 104 10/21/2023 0926   CO2 26 10/21/2023 0926   CO2 26 04/22/2016 0750   GLUCOSE 102 (H) 10/21/2023 0926   GLUCOSE 142 (H) 04/22/2016 0750   BUN 17 10/21/2023 0926   BUN 19 07/23/2023 0925   BUN 19.1 11/20/2016 1355   CREATININE 1.10 (H) 10/21/2023 0926   CREATININE 1.1 11/20/2016 1355   CALCIUM  12.3 (H) 10/21/2023 0926   CALCIUM  9.8 04/22/2016 0750   GFRNONAA 47 (L) 03/28/2022 1508   GFRNONAA 48 (L) 12/29/2019 1015   GFRNONAA 63 07/08/2014 1212   GFRAA 69 04/17/2020 0928   GFRAA 56 (L) 12/29/2019 1015   GFRAA 73 07/08/2014 1212   Lab Results  Component Value Date   HGBA1C 6.0 (H) 07/23/2023   HGBA1C 10.1 (H) 07/07/2015   No results found for: INSULIN  Lab Results  Component Value Date   TSH 1.16 10/21/2023   CBC    Component Value Date/Time   WBC 5.4 03/28/2022 1508   RBC 4.97 03/28/2022 1508   HGB 15.0 03/28/2022 1508   HGB 13.3 02/03/2020 0837   HGB 13.6 12/04/2018 0914   HGB 12.9 04/22/2016 0750   HCT 46.8 (H) 03/28/2022 1508   HCT 41.0 12/04/2018 0914   HCT 39.0 04/22/2016 0750   PLT 318 03/28/2022 1508   PLT 270 02/03/2020 0837   PLT 332 12/04/2018 0914   MCV 94.2 03/28/2022 1508   MCV 88 12/04/2018 0914   MCV 92.2 04/22/2016 0750   MCH 30.2 03/28/2022 1508   MCHC 32.1 03/28/2022 1508   RDW 13.6 03/28/2022 1508   RDW 13.6 12/04/2018 0914   RDW 13.9 04/22/2016 0750   Iron Studies    Component Value Date/Time   IRON 43 08/18/2010 0515   TIBC 389 08/18/2010 0515   FERRITIN 227 08/18/2010 0515   IRONPCTSAT 11 (L) 08/18/2010 0515   Lipid Panel     Component Value Date/Time   CHOL 161 07/23/2023 0925   TRIG 162 (H) 07/23/2023 0925   HDL 47 07/23/2023 0925   CHOLHDL 3.4 07/23/2023 0925   CHOLHDL 5.1 (H) 08/14/2016 1729   VLDL 74 (H) 08/14/2016 1729   LDLCALC 86 07/23/2023 0925   Hepatic Function Panel     Component Value  Date/Time   PROT 7.9 10/21/2023 0926   PROT 7.3 07/23/2023 0925   PROT 8.2 04/22/2016 0750   ALBUMIN 4.6 07/23/2023 0925   ALBUMIN 3.8 04/22/2016 0750   AST 26 10/21/2023 0926   AST 29 12/29/2019 1015   AST 27 04/22/2016 0750   ALT 24 10/21/2023 0926   ALT 33  12/29/2019 1015   ALT 24 04/22/2016 0750   ALKPHOS 82 07/23/2023 0925   ALKPHOS 74 04/22/2016 0750   BILITOT 0.6 10/21/2023 0926   BILITOT 0.5 07/23/2023 0925   BILITOT 0.6 12/29/2019 1015   BILITOT 0.44 04/22/2016 0750   BILIDIR 0.2 08/18/2010 0515   IBILI 1.5 (H) 08/18/2010 0515      Component Value Date/Time   TSH 1.16 10/21/2023 0926   Nutritional Lab Results  Component Value Date   VD25OH 38 10/21/2023   VD25OH 33.96 04/23/2023   VD25OH 29.26 (L) 10/21/2022    Attestations:   LILLETTE Vernell Forest, acting as a medical scribe for Sarah Jenkins, DO., have compiled all relevant documentation for today's office visit on behalf of Sarah Jenkins, DO, while in the presence of [employer], DO.  I have reviewed the above documentation for accuracy and completeness, and I agree with the above. Sarah JINNY [name], D.O.  The 21st Century Cures Act was signed into law in 2016 which includes the topic of electronic health records.  This provides immediate access to information in MyChart.  This includes consultation notes, operative notes, office notes, lab results and pathology reports.  If you have any questions about what you read please let us  know at your next visit so we can discuss your concerns and take corrective action if need be.  We are right here with you.

## 2023-12-26 ENCOUNTER — Other Ambulatory Visit: Payer: Self-pay | Admitting: Internal Medicine

## 2023-12-26 NOTE — Telephone Encounter (Signed)
 Request for receiver refused patient should have device. Refills Libre sensor have refills available.

## 2024-01-02 ENCOUNTER — Ambulatory Visit: Admitting: Internal Medicine

## 2024-01-03 ENCOUNTER — Other Ambulatory Visit (INDEPENDENT_AMBULATORY_CARE_PROVIDER_SITE_OTHER): Payer: Self-pay | Admitting: Family Medicine

## 2024-01-03 DIAGNOSIS — F39 Unspecified mood [affective] disorder: Secondary | ICD-10-CM

## 2024-01-07 ENCOUNTER — Encounter (INDEPENDENT_AMBULATORY_CARE_PROVIDER_SITE_OTHER): Payer: Self-pay | Admitting: Family Medicine

## 2024-01-07 ENCOUNTER — Ambulatory Visit (INDEPENDENT_AMBULATORY_CARE_PROVIDER_SITE_OTHER): Admitting: Family Medicine

## 2024-01-07 VITALS — BP 135/84 | HR 79 | Temp 98.7°F | Ht 65.0 in | Wt 184.0 lb

## 2024-01-07 DIAGNOSIS — I152 Hypertension secondary to endocrine disorders: Secondary | ICD-10-CM

## 2024-01-07 DIAGNOSIS — Z7985 Long-term (current) use of injectable non-insulin antidiabetic drugs: Secondary | ICD-10-CM

## 2024-01-07 DIAGNOSIS — F39 Unspecified mood [affective] disorder: Secondary | ICD-10-CM | POA: Diagnosis not present

## 2024-01-07 DIAGNOSIS — K219 Gastro-esophageal reflux disease without esophagitis: Secondary | ICD-10-CM

## 2024-01-07 DIAGNOSIS — E669 Obesity, unspecified: Secondary | ICD-10-CM | POA: Diagnosis not present

## 2024-01-07 DIAGNOSIS — E782 Mixed hyperlipidemia: Secondary | ICD-10-CM | POA: Diagnosis not present

## 2024-01-07 DIAGNOSIS — F5089 Other specified eating disorder: Secondary | ICD-10-CM | POA: Diagnosis not present

## 2024-01-07 DIAGNOSIS — E1159 Type 2 diabetes mellitus with other circulatory complications: Secondary | ICD-10-CM

## 2024-01-07 DIAGNOSIS — Z7984 Long term (current) use of oral hypoglycemic drugs: Secondary | ICD-10-CM

## 2024-01-07 DIAGNOSIS — E66811 Obesity, class 1: Secondary | ICD-10-CM

## 2024-01-07 DIAGNOSIS — Z6830 Body mass index (BMI) 30.0-30.9, adult: Secondary | ICD-10-CM

## 2024-01-07 DIAGNOSIS — E1169 Type 2 diabetes mellitus with other specified complication: Secondary | ICD-10-CM | POA: Diagnosis not present

## 2024-01-07 MED ORDER — TIRZEPATIDE 15 MG/0.5ML ~~LOC~~ SOAJ: 15.0000 mg | SUBCUTANEOUS | 0 refills | Status: DC

## 2024-01-07 MED ORDER — OMEGA-3-ACID ETHYL ESTERS 1 G PO CAPS: 2.0000 g | ORAL_CAPSULE | Freq: Two times a day (BID) | ORAL | 0 refills | Status: DC

## 2024-01-07 MED ORDER — BUPROPION HCL ER (SR) 200 MG PO TB12: 200.0000 mg | ORAL_TABLET | Freq: Two times a day (BID) | ORAL | 0 refills | Status: DC

## 2024-01-07 NOTE — Progress Notes (Signed)
 Sarah [name], D.O.  ABFM, ABOM Specializing in Clinical Bariatric Medicine  Office located at: [address] [place], [place]  [address]   Assessment and Plan:   Medications Discontinued During This Encounter  Medication Reason   gabapentin  (NEURONTIN ) 600 MG tablet Patient Preference   omega-3 acid ethyl esters (LOVAZA ) 1 g capsule Reorder   buPROPion  (WELLBUTRIN  SR) 200 MG 12 hr tablet Reorder   tirzepatide  (MOUNJARO ) 15 MG/0.5ML Pen Reorder    Meds ordered this encounter  Medications   buPROPion  (WELLBUTRIN  SR) 200 MG 12 hr tablet    Sig: Take 1 tablet (200 mg total) by mouth 2 (two) times daily.    Dispense:  180 tablet    Refill:  0   tirzepatide  (MOUNJARO ) 15 MG/0.5ML Pen    Sig: Inject 15 mg into the skin once a week every Thursday    Dispense:  6 mL    Refill:  0   omega-3 acid ethyl esters (LOVAZA ) 1 g capsule    Sig: Take 2 capsules (2 g total) by mouth 2 (two) times daily.    Dispense:  180 capsule    Refill:  0      FOR THE DISEASE OF OBESITY: Obesity with starting BMI 37.9/date 02/15/2020 BMI 30.0-34.9 -- current 30.62 Assessment & Plan: Since last office visit on 12/24/23 patient's muscle mass has increased by 0.4 lbs. Fat mass has decreased by 6.2 lbs. Total body water  has decreased by 2.6 lbs.  Body fat % has decreased by 2.2 %. Counseling done on how various foods will affect these numbers and how to maximize success  Total lbs lost to date: 44 lbs Total weight loss percentage to date: -19.30 %   Recommended Dietary Goals Sarah [name] is currently in the action stage of change. As such, her goal is to continue weight management plan.  She has agreed to: continue current plan   Behavioral Intervention We discussed the following today: increasing lean protein intake to established goals, avoiding skipping meals, increasing water  intake , decreasing eating out or consumption of processed foods, and making healthy choices when eating convenient foods,  continue to practice mindfulness when eating, and continue to work on maintaining a reduced calorie state, getting the recommended amount of protein, incorporating whole foods, making healthy choices, staying well hydrated and practicing mindfulness when eating.  Additional resources provided today: None  Evidence-based interventions for health behavior change were utilized today including the discussion of self monitoring techniques, problem-solving barriers and SMART goal setting techniques.   Regarding patient's less desirable eating habits and patterns, we employed the technique of small changes.   Pt will specifically work on: Aiming to meet her protein intake goal on a daily basis    Recommended Physical Activity Goals Sarah [name] has been advised to work up to 300-450 minutes of moderate intensity aerobic activity a week and strengthening exercises 2-3 times per week for cardiovascular health, weight loss maintenance and preservation of muscle mass.   She has agreed to: Continue current level of physical activity    Pharmacotherapy We both agreed to: Continue with current nutritional and behavioral strategies and meds that aid in wt loss (Wellbutrin , Metformin , and Mounjaro ).    ASSOCIATED CONDITIONS ADDRESSED TODAY:  Hypertension associated with type 2 diabetes mellitus Sarah [name]) Assessment & Plan: BP Readings from Last 3 Encounters:  01/07/24 135/84  12/24/23 104/69  11/25/23 136/83   BP well controlled on Amlodipine  10 mg daily, Hydralazine  50 mg TID, and Losartan  50 mg  daily. Good compliance and tolerance. Reports BP reading of 130/70s when last checked. No acute concerns.   BP goal of <130/80 reviewed with patient. Continue current antihypertensive regimen as prescribed. Continue focusing on heart-healthy, low-salt diet per her nutritional meal plan.     Depressed mood (HCC) with emotional eating Assessment & Plan: Moods are stable. Currently on Wellbutrin  SR 200 mg BID.  Compliant and tolerating well, no SE. Overall, her hunger and cravings are well controlled. Snacking more on fruits and cut back on chips.   Reminded pt the importance of prioritizing her physical and mental health. Continue with current med regimen as prescribed. Will refill Wellbutrin  today, no changes. Continue monitoring.     Type 2 diabetes mellitus with obesity Sarah [name]) Assessment & Plan: Lab Results  Component Value Date   HGBA1C 6.0 (H) 07/23/2023   HGBA1C 6.4 04/23/2023   HGBA1C 6.3 01/20/2023    Compliant with taking Metformin  500 mg once daily and Mounjaro  15 once weekly (Thursdays). Tolerating well with no adverse SE reported. Per her glucose monitor her avg blood glucose readings: avg for 7 days is 80; avg for 14 days was 80s; and avg fo 30 days was 90.   Continue working on her low-carb and high protein meal plan. Avoid skipping meals; eat all 3 meals per day. Continue current medication regimen as prescribed. Will refill Mounjaro  today, no dose changes.    Gastroesophageal reflux disease, unspecified whether esophagitis present Assessment & Plan: Currently on Omeprazole  20 mg once daily. Pt states she was previously advised by Dr. Shelah to take prolisec BID. LOV, Omeprazole  was refilled and pt was advised she get her next refill from GI and discuss her recommended dose given her reflux sx have overall improved. Continue to monitor condition alongside GI care team and PCP.     Mixed diabetic hyperlipidemia associated with type 2 diabetes mellitus Sarah [name]) Assessment & Plan: Lab Results  Component Value Date   CHOL 161 07/23/2023   HDL 47 07/23/2023   LDLCALC 86 07/23/2023   TRIG 162 (H) 07/23/2023   CHOLHDL 3.4 07/23/2023   Pt is on Rosuvastatin  20 mg once daily and Lovaza  2 g BID. Good compliance/tolerance. Has cut back on fatty foods and stopped eating chips.   Continue current statin therapy as prescribed. Will refill Lovaza  today, no changes. Continue working on  reducing fatty meats, decreasing simple carb intake, and increasing lean protein intake per her nutritional meal plan. Continue monitoring.     Follow up:   Return in about 2 weeks (around 01/21/2024) for f/u on 01/20/2024 at 9:20 AM.  She was informed of the importance of frequent follow up visits to maximize her success with intensive lifestyle modifications for her multiple health conditions.  Subjective:   Chief complaint: Obesity Sarah [name] is here to discuss her progress with her obesity treatment plan. She is journaling 1300-1400 cal and 85+g protein with the Category 2 MP as a guide and states she is following her eating plan approximately 60% of the time. She states she is going to the gym 50 minutes 3 days per week.  Interval History:  Sarah [name] is here for a follow up office visit. Since last OV on 12/24/23, she is down 6 lbs. Was eating all her 3 meals per day and overall is skipping less meals per week. Eating more lean protein and made more recipes with chicken. Also made meals with salmon and tilapia. Ate multiple smaller meals per day with smaller portion sizes while  still ensuring she felt satisfied. She does admit to getting ice cream from a homemade dairy farm, noting it was time.   Of note, pt reports she was taken off Gabapentin  and started on Cymbalta .   Pharmacotherapy that aid with weight loss: She is currently taking Wellbutrin  SR 200 mg BID, Metformin  500 mg once daily, and Mounjaro  15 mg once weekly on Thursdays.   Review of Systems:  Pertinent positives were addressed with patient today.  Reviewed by clinician on day of visit: allergies, medications, problem list, medical history, surgical history, family history, social history, and previous encounter notes.  Weight Summary and Biometrics   Weight Lost Since Last Visit: 6lb  Weight Gained Since Last Visit: 0lb   Vitals Temp: 98.7 F (37.1 C) BP: 135/84 Pulse Rate: 79 SpO2: 98 %   Anthropometric  Measurements Height: 5' 5 (1.651 m) Weight: 184 lb (83.5 kg) BMI (Calculated): 30.62 Weight at Last Visit: 190lb Weight Lost Since Last Visit: 6lb Weight Gained Since Last Visit: 0lb Starting Weight: 228lb Total Weight Loss (lbs): 44 lb (20 kg) Peak Weight: 228lb   Body Composition  Body Fat %: 37.2 % Fat Mass (lbs): 68.6 lbs Muscle Mass (lbs): 109.8 lbs Total Body Water  (lbs): 70.6 lbs Visceral Fat Rating : 9   Other Clinical Data Fasting: Yes Labs: NO Today's Visit #: 86 Starting Date: 02/15/20 Comments: Cat 2/j 1300-1400/85+    Objective:   PHYSICAL EXAM: Blood pressure 135/84, pulse 79, temperature 98.7 F (37.1 C), height 5' 5 (1.651 m), weight 184 lb (83.5 kg), SpO2 98%. Body mass index is 30.62 kg/m.  General: she is overweight, cooperative and in no acute distress. PSYCH: Has normal mood, affect and thought process.   HEENT: EOMI, sclerae are anicteric. Lungs: Normal breathing effort, no conversational dyspnea. Extremities: Moves * 4 Neurologic: A and O * 3, good insight  DIAGNOSTIC DATA REVIEWED: BMET    Component Value Date/Time   NA 139 10/21/2023 0926   NA 143 07/23/2023 0925   NA 138 04/22/2016 0750   K 4.1 10/21/2023 0926   K 3.9 04/22/2016 0750   CL 104 10/21/2023 0926   CO2 26 10/21/2023 0926   CO2 26 04/22/2016 0750   GLUCOSE 102 (H) 10/21/2023 0926   GLUCOSE 142 (H) 04/22/2016 0750   BUN 17 10/21/2023 0926   BUN 19 07/23/2023 0925   BUN 19.1 11/20/2016 1355   CREATININE 1.10 (H) 10/21/2023 0926   CREATININE 1.1 11/20/2016 1355   CALCIUM  12.3 (H) 10/21/2023 0926   CALCIUM  9.8 04/22/2016 0750   GFRNONAA 47 (L) 03/28/2022 1508   GFRNONAA 48 (L) 12/29/2019 1015   GFRNONAA 63 07/08/2014 1212   GFRAA 69 04/17/2020 0928   GFRAA 56 (L) 12/29/2019 1015   GFRAA 73 07/08/2014 1212   Lab Results  Component Value Date   HGBA1C 6.0 (H) 07/23/2023   HGBA1C 10.1 (H) 07/07/2015   No results found for: INSULIN  Lab Results  Component  Value Date   TSH 1.16 10/21/2023   CBC    Component Value Date/Time   WBC 5.4 03/28/2022 1508   RBC 4.97 03/28/2022 1508   HGB 15.0 03/28/2022 1508   HGB 13.3 02/03/2020 0837   HGB 13.6 12/04/2018 0914   HGB 12.9 04/22/2016 0750   HCT 46.8 (H) 03/28/2022 1508   HCT 41.0 12/04/2018 0914   HCT 39.0 04/22/2016 0750   PLT 318 03/28/2022 1508   PLT 270 02/03/2020 0837   PLT 332 12/04/2018 0914   MCV  94.2 03/28/2022 1508   MCV 88 12/04/2018 0914   MCV 92.2 04/22/2016 0750   MCH 30.2 03/28/2022 1508   MCHC 32.1 03/28/2022 1508   RDW 13.6 03/28/2022 1508   RDW 13.6 12/04/2018 0914   RDW 13.9 04/22/2016 0750   Iron Studies    Component Value Date/Time   IRON 43 08/18/2010 0515   TIBC 389 08/18/2010 0515   FERRITIN 227 08/18/2010 0515   IRONPCTSAT 11 (L) 08/18/2010 0515   Lipid Panel     Component Value Date/Time   CHOL 161 07/23/2023 0925   TRIG 162 (H) 07/23/2023 0925   HDL 47 07/23/2023 0925   CHOLHDL 3.4 07/23/2023 0925   CHOLHDL 5.1 (H) 08/14/2016 1729   VLDL 74 (H) 08/14/2016 1729   LDLCALC 86 07/23/2023 0925   Hepatic Function Panel     Component Value Date/Time   PROT 7.9 10/21/2023 0926   PROT 7.3 07/23/2023 0925   PROT 8.2 04/22/2016 0750   ALBUMIN 4.6 07/23/2023 0925   ALBUMIN 3.8 04/22/2016 0750   AST 26 10/21/2023 0926   AST 29 12/29/2019 1015   AST 27 04/22/2016 0750   ALT 24 10/21/2023 0926   ALT 33 12/29/2019 1015   ALT 24 04/22/2016 0750   ALKPHOS 82 07/23/2023 0925   ALKPHOS 74 04/22/2016 0750   BILITOT 0.6 10/21/2023 0926   BILITOT 0.5 07/23/2023 0925   BILITOT 0.6 12/29/2019 1015   BILITOT 0.44 04/22/2016 0750   BILIDIR 0.2 08/18/2010 0515   IBILI 1.5 (H) 08/18/2010 0515      Component Value Date/Time   TSH 1.16 10/21/2023 0926   Nutritional Lab Results  Component Value Date   VD25OH 38 10/21/2023   VD25OH 33.96 04/23/2023   VD25OH 29.26 (L) 10/21/2022    Attestations:   LILLETTE Vernell Forest, acting as a medical scribe for  Sarah Jenkins, DO., have compiled all relevant documentation for today's office visit on behalf of Sarah Jenkins, DO, while in the presence of [employer], DO.  I have reviewed the above documentation for accuracy and completeness, and I agree with the above. Sarah JINNY [name], D.O.  The 21st Century Cures Act was signed into law in 2016 which includes the topic of electronic health records.  This provides immediate access to information in MyChart.  This includes consultation notes, operative notes, office notes, lab results and pathology reports.  If you have any questions about what you read please let us  know at your next visit so we can discuss your concerns and take corrective action if need be.  We are right here with you.

## 2024-01-14 ENCOUNTER — Ambulatory Visit
Admission: RE | Admit: 2024-01-14 | Discharge: 2024-01-14 | Disposition: A | Discharge status: Home / Self Care | Payer: Medicare PPO | Source: Ambulatory Visit | Attending: Emergency Medicine | Admitting: Emergency Medicine

## 2024-01-14 DIAGNOSIS — R911 Solitary pulmonary nodule: Secondary | ICD-10-CM | POA: Diagnosis not present

## 2024-01-15 ENCOUNTER — Other Ambulatory Visit (INDEPENDENT_AMBULATORY_CARE_PROVIDER_SITE_OTHER): Payer: Self-pay | Admitting: Family Medicine

## 2024-01-15 DIAGNOSIS — Z9109 Other allergy status, other than to drugs and biological substances: Secondary | ICD-10-CM

## 2024-01-20 ENCOUNTER — Ambulatory Visit (INDEPENDENT_AMBULATORY_CARE_PROVIDER_SITE_OTHER): Admitting: Family Medicine

## 2024-01-20 ENCOUNTER — Encounter (INDEPENDENT_AMBULATORY_CARE_PROVIDER_SITE_OTHER): Payer: Self-pay | Admitting: Family Medicine

## 2024-01-20 VITALS — BP 115/77 | HR 82 | Temp 98.0°F | Ht 65.0 in | Wt 181.0 lb

## 2024-01-20 DIAGNOSIS — Z7984 Long term (current) use of oral hypoglycemic drugs: Secondary | ICD-10-CM | POA: Diagnosis not present

## 2024-01-20 DIAGNOSIS — E669 Obesity, unspecified: Secondary | ICD-10-CM | POA: Diagnosis not present

## 2024-01-20 DIAGNOSIS — E559 Vitamin D deficiency, unspecified: Secondary | ICD-10-CM

## 2024-01-20 DIAGNOSIS — I152 Hypertension secondary to endocrine disorders: Secondary | ICD-10-CM | POA: Diagnosis not present

## 2024-01-20 DIAGNOSIS — Z6830 Body mass index (BMI) 30.0-30.9, adult: Secondary | ICD-10-CM | POA: Diagnosis not present

## 2024-01-20 DIAGNOSIS — Z9109 Other allergy status, other than to drugs and biological substances: Secondary | ICD-10-CM

## 2024-01-20 DIAGNOSIS — Z7985 Long-term (current) use of injectable non-insulin antidiabetic drugs: Secondary | ICD-10-CM

## 2024-01-20 DIAGNOSIS — J3089 Other allergic rhinitis: Secondary | ICD-10-CM | POA: Diagnosis not present

## 2024-01-20 DIAGNOSIS — E1159 Type 2 diabetes mellitus with other circulatory complications: Secondary | ICD-10-CM

## 2024-01-20 DIAGNOSIS — E1169 Type 2 diabetes mellitus with other specified complication: Secondary | ICD-10-CM

## 2024-01-20 DIAGNOSIS — E66811 Obesity, class 1: Secondary | ICD-10-CM

## 2024-01-20 MED ORDER — MONTELUKAST SODIUM 10 MG PO TABS: 10.0000 mg | ORAL_TABLET | Freq: Every day | ORAL | 0 refills | Status: DC

## 2024-01-20 NOTE — Progress Notes (Signed)
 Sarah [name], D.O.  ABFM, ABOM Specializing in Clinical Bariatric Medicine  Office located at: [address] [place], [place]  [address]   Assessment and Plan:   Medications Discontinued During This Encounter  Medication Reason   montelukast  (SINGULAIR ) 10 MG tablet Reorder     Meds ordered this encounter  Medications   montelukast  (SINGULAIR ) 10 MG tablet    Sig: Take 1 tablet (10 mg total) by mouth at bedtime.    Dispense:  90 tablet    Refill:  0      FOR THE DISEASE OF OBESITY:  Obesity (BMI 30.0-34.9) current 30.12 Obesity with starting BMI 37.9/date 02/15/2020 Assessment & Plan: Since last office visit on 01/07/2024 patient's muscle mass has decreased by 3 lbs. Fat mass has increased by 0.6 lbs. Total body water  has decreased by 2 lbs.  Body fat % has increased by 0.9 %. Counseling done on how various foods will affect these numbers and how to maximize success  Total lbs lost to date: -47 lbs Total weight loss percentage to date: -20.61 %   Recommended Dietary Goals Sarah [name] is currently in the action stage of change. As such, her goal is to continue weight management plan.  She has agreed to: continue current plan   Behavioral Intervention We discussed the following today: proper sleep hygiene practices, increasing lean protein intake to established goals and avoiding skipping meals  Additional resources provided today: n/a  Evidence-based interventions for health behavior change were utilized today including the discussion of self monitoring techniques, problem-solving barriers and SMART goal setting techniques.   Regarding patient's less desirable eating habits and patterns, we employed the technique of small changes.   Goal: avoid skipping meals   Recommended Physical Activity Goals Sarah [name] has been advised to work up to 300-450 minutes of moderate intensity aerobic activity a week and strengthening exercises 2-3 times per week for cardiovascular  health, weight loss maintenance and preservation of muscle mass.   She may continue to gradually increase the amount and intensity of exercise routine   Pharmacotherapy Of note, patient states that since her PCP discontinued Metoprolol  and Gabapentin  per our recommendations, it has been easier for her to lose weight.    ASSOCIATED CONDITIONS ADDRESSED TODAY:   Type 2 diabetes mellitus with obesity [facility]) Assessment & Plan: Lab Results  Component Value Date   HGBA1C 6.0 (H) 07/23/2023   HGBA1C 6.4 04/23/2023   HGBA1C 6.3 01/20/2023    HgbA1c is well-controlled for age and comorbid conditions. Her average fasting blood sugars over the past 3 months has been 83. The lowest blood sugar she has seen was 56 but after pricking her finger it was 77: she was completely asx at that time. On Metformin  500 mg once daily and Mounjaro  15 mg weekly with good adherence and tolerance. Good control of hunger and cravings when following PNP.   - Continue all antidiabetic medications.  - Eat on a regular basis- no skipping or going long periods without eating.     - Continue to decrease simple carbs/ sugars; increase fiber and proteins -> follow her meal plan/journal intake - F/up with PCP as directed.    Hypertension associated with type 2 diabetes mellitus (HCC) Assessment & Plan: Last 3 blood pressure readings in our office are as follows: BP Readings from Last 3 Encounters:  01/20/24 115/77  01/07/24 135/84  12/24/23 104/69   The 10-year ASCVD risk score (Arnett DK, et al., 2019) is: 9.2%  Lab Results  Component Value Date   CREATININE 1.10 (H) 10/21/2023   On Amlodipine  10 mg daily, Hydralazine  50 mg TID, and Losartan  50 mg daily.  with reported good compliance and tolerance. Blood pressure is at target today. No acute concerns.   - Continue all medications.  - Continue with low sodium diet - advance exercise as tolerated    Environmental allergies Assessment & Plan: Her  allergies have been very well controlled on Xyzal  5 mg daily and Montelukast  10 mg daily. She requests refills of meds. She will continue current regimen and her low-inflammatory meal plan. Will continue to monitor.    Vitamin D  deficiency Assessment & Plan: Lab Results  Component Value Date   VD25OH 38 10/21/2023   VD25OH 33.96 04/23/2023   VD25OH 29.26 (L) 10/21/2022   Currently on OTC Vit D 1,000 units daily with good compliance and tolerance. No acute concerns.   - Continue regimen and weight loss efforts.  - Recheck as deemed medically necessary.   Follow up:   Return 02/03/2024 11:00 AM.  She was informed of the importance of frequent follow up visits to maximize her success with intensive lifestyle modifications for her multiple health conditions.   Subjective:   Chief complaint: Obesity Sarah [name] is here to discuss her progress with her obesity treatment plan. She is journaling 1300-1400 cal and 85+g protein with the Category 2 MP as a guide and states she is following her eating plan approximately 60% of the time. 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   Interval History:  Sarah [name] is here for a follow up office visit. Since last OV on 01/07/2024 , she is doing good and down 3 lbs. She has been more intentional about increasing her lean protein intake, eating healthier, and eating more frequently throughout the day. She does admit to still sometimes skipping meals (about 40% of the time) and is also not journaling. Denies excessive hunger and cravings. Sleep wise, she is not getting in 7-9 hours/night. She also sometimes naps during the day and will sleep late.   Pharmacotherapy that aid with weight loss: She is currently taking Wellbutrin  SR 200 mg BID, Metformin  500 mg once daily, and Mounjaro  15 mg once weekly on Thursdays.    Review of Systems:  Pertinent positives were addressed with patient today.  Reviewed by  clinician on day of visit: allergies, medications, problem list, medical history, surgical history, family history, social history, and previous encounter notes.  Weight Summary and Biometrics   Weight Lost Since Last Visit: 3lb  Weight Gained Since Last Visit: 0   Vitals Temp: 98 F (36.7 C) BP: 115/77 Pulse Rate: 82 SpO2: 95 %   Anthropometric Measurements Height: 5' 5 (1.651 m) Weight: 181 lb (82.1 kg) BMI (Calculated): 30.12 Weight at Last Visit: 184lb Weight Lost Since Last Visit: 3lb Weight Gained Since Last Visit: 0 Starting Weight: 228lb Total Weight Loss (lbs): 47 lb (21.3 kg) Peak Weight: 228lb   Body Composition  Body Fat %: 38.1 % Fat Mass (lbs): 69.2 lbs Muscle Mass (lbs): 106.8 lbs Total Body Water  (lbs): 68.6 lbs Visceral Fat Rating : 10   Other Clinical Data Fasting: yes Labs: no Today's Visit #: 87 Starting Date: 02/15/20    Objective:   PHYSICAL EXAM: Blood pressure 115/77, pulse 82, temperature 98 F (36.7 C), height 5' 5 (1.651 m), weight 181 lb (82.1 kg), SpO2 95%. Body mass index is 30.12 kg/m.  General: she  is overweight, cooperative and in no acute distress. PSYCH: Has normal mood, affect and thought process.   HEENT: EOMI, sclerae are anicteric. Lungs: Normal breathing effort, no conversational dyspnea. Extremities: Moves * 4 Neurologic: A and O * 3, good insight  DIAGNOSTIC DATA REVIEWED: BMET    Component Value Date/Time   NA 139 10/21/2023 0926   NA 143 07/23/2023 0925   NA 138 04/22/2016 0750   K 4.1 10/21/2023 0926   K 3.9 04/22/2016 0750   CL 104 10/21/2023 0926   CO2 26 10/21/2023 0926   CO2 26 04/22/2016 0750   GLUCOSE 102 (H) 10/21/2023 0926   GLUCOSE 142 (H) 04/22/2016 0750   BUN 17 10/21/2023 0926   BUN 19 07/23/2023 0925   BUN 19.1 11/20/2016 1355   CREATININE 1.10 (H) 10/21/2023 0926   CREATININE 1.1 11/20/2016 1355   CALCIUM  12.3 (H) 10/21/2023 0926   CALCIUM  9.8 04/22/2016 0750   GFRNONAA 47 (L)  03/28/2022 1508   GFRNONAA 48 (L) 12/29/2019 1015   GFRNONAA 63 07/08/2014 1212   GFRAA 69 04/17/2020 0928   GFRAA 56 (L) 12/29/2019 1015   GFRAA 73 07/08/2014 1212   Lab Results  Component Value Date   HGBA1C 6.0 (H) 07/23/2023   HGBA1C 10.1 (H) 07/07/2015   No results found for: INSULIN  Lab Results  Component Value Date   TSH 1.16 10/21/2023   CBC    Component Value Date/Time   WBC 5.4 03/28/2022 1508   RBC 4.97 03/28/2022 1508   HGB 15.0 03/28/2022 1508   HGB 13.3 02/03/2020 0837   HGB 13.6 12/04/2018 0914   HGB 12.9 04/22/2016 0750   HCT 46.8 (H) 03/28/2022 1508   HCT 41.0 12/04/2018 0914   HCT 39.0 04/22/2016 0750   PLT 318 03/28/2022 1508   PLT 270 02/03/2020 0837   PLT 332 12/04/2018 0914   MCV 94.2 03/28/2022 1508   MCV 88 12/04/2018 0914   MCV 92.2 04/22/2016 0750   MCH 30.2 03/28/2022 1508   MCHC 32.1 03/28/2022 1508   RDW 13.6 03/28/2022 1508   RDW 13.6 12/04/2018 0914   RDW 13.9 04/22/2016 0750   Iron Studies    Component Value Date/Time   IRON 43 08/18/2010 0515   TIBC 389 08/18/2010 0515   FERRITIN 227 08/18/2010 0515   IRONPCTSAT 11 (L) 08/18/2010 0515   Lipid Panel     Component Value Date/Time   CHOL 161 07/23/2023 0925   TRIG 162 (H) 07/23/2023 0925   HDL 47 07/23/2023 0925   CHOLHDL 3.4 07/23/2023 0925   CHOLHDL 5.1 (H) 08/14/2016 1729   VLDL 74 (H) 08/14/2016 1729   LDLCALC 86 07/23/2023 0925   Hepatic Function Panel     Component Value Date/Time   PROT 7.9 10/21/2023 0926   PROT 7.3 07/23/2023 0925   PROT 8.2 04/22/2016 0750   ALBUMIN 4.6 07/23/2023 0925   ALBUMIN 3.8 04/22/2016 0750   AST 26 10/21/2023 0926   AST 29 12/29/2019 1015   AST 27 04/22/2016 0750   ALT 24 10/21/2023 0926   ALT 33 12/29/2019 1015   ALT 24 04/22/2016 0750   ALKPHOS 82 07/23/2023 0925   ALKPHOS 74 04/22/2016 0750   BILITOT 0.6 10/21/2023 0926   BILITOT 0.5 07/23/2023 0925   BILITOT 0.6 12/29/2019 1015   BILITOT 0.44 04/22/2016 0750    BILIDIR 0.2 08/18/2010 0515   IBILI 1.5 (H) 08/18/2010 0515      Component Value Date/Time   TSH 1.16 10/21/2023 9073  Nutritional Lab Results  Component Value Date   VD25OH 38 10/21/2023   VD25OH 33.96 04/23/2023   VD25OH 29.26 (L) 10/21/2022    Attestations:   I, Sarah [name], acting as a Stage manager for Sarah Jenkins, DO., have compiled all relevant documentation for today's office visit on behalf of Sarah Jenkins, DO, while in the presence of [employer], DO.  I have reviewed the above documentation for accuracy and completeness, and I agree with the above. Sarah [name], D.O.  The 21st Century Cures Act was signed into law in 2016 which includes the topic of electronic health records.  This provides immediate access to information in MyChart.  This includes consultation notes, operative notes, office notes, lab results and pathology reports.  If you have any questions about what you read please let us  know at your next visit so we can discuss your concerns and take corrective action if need be.  We are right here with you.

## 2024-01-22 ENCOUNTER — Encounter (INDEPENDENT_AMBULATORY_CARE_PROVIDER_SITE_OTHER): Payer: Self-pay | Admitting: Family Medicine

## 2024-01-22 ENCOUNTER — Ambulatory Visit: Attending: Internal Medicine | Admitting: Internal Medicine

## 2024-01-22 ENCOUNTER — Encounter: Payer: Self-pay | Admitting: Internal Medicine

## 2024-01-22 VITALS — BP 125/78 | HR 92 | Temp 98.2°F | Ht 65.0 in | Wt 186.0 lb

## 2024-01-22 DIAGNOSIS — Z7984 Long term (current) use of oral hypoglycemic drugs: Secondary | ICD-10-CM

## 2024-01-22 DIAGNOSIS — Z6830 Body mass index (BMI) 30.0-30.9, adult: Secondary | ICD-10-CM | POA: Diagnosis not present

## 2024-01-22 DIAGNOSIS — E669 Obesity, unspecified: Secondary | ICD-10-CM

## 2024-01-22 DIAGNOSIS — E1169 Type 2 diabetes mellitus with other specified complication: Secondary | ICD-10-CM

## 2024-01-22 DIAGNOSIS — I152 Hypertension secondary to endocrine disorders: Secondary | ICD-10-CM | POA: Diagnosis not present

## 2024-01-22 DIAGNOSIS — R0789 Other chest pain: Secondary | ICD-10-CM

## 2024-01-22 DIAGNOSIS — Z7985 Long-term (current) use of injectable non-insulin antidiabetic drugs: Secondary | ICD-10-CM

## 2024-01-22 DIAGNOSIS — L602 Onychogryphosis: Secondary | ICD-10-CM

## 2024-01-22 DIAGNOSIS — E119 Type 2 diabetes mellitus without complications: Secondary | ICD-10-CM

## 2024-01-22 DIAGNOSIS — E1159 Type 2 diabetes mellitus with other circulatory complications: Secondary | ICD-10-CM

## 2024-01-22 DIAGNOSIS — E785 Hyperlipidemia, unspecified: Secondary | ICD-10-CM

## 2024-01-22 DIAGNOSIS — N1831 Chronic kidney disease, stage 3a: Secondary | ICD-10-CM | POA: Diagnosis not present

## 2024-01-22 DIAGNOSIS — R911 Solitary pulmonary nodule: Secondary | ICD-10-CM

## 2024-01-22 LAB — POCT GLYCOSYLATED HEMOGLOBIN (HGB A1C): HbA1c, POC (controlled diabetic range): 5.6 % (ref 0.0–7.0)

## 2024-01-22 LAB — GLUCOSE, POCT (MANUAL RESULT ENTRY): POC Glucose: 127 mg/dL — AB (ref 70–99)

## 2024-01-22 NOTE — Patient Instructions (Signed)
 VISIT SUMMARY:  Today, you had a follow-up appointment to review your diabetes, hypertension, hyperlipidemia, and other health concerns. Your diabetes management is showing excellent results with improved A1c levels and significant weight loss. Your blood pressure and cholesterol levels are also well-controlled. We discussed your recent CT scan for the lung nodule, and we are awaiting the results. Additionally, we addressed your chest pain, hypercalcemia, thyroid  nodule, and foot issues.  YOUR PLAN:  -TYPE 2 DIABETES MELLITUS: Your diabetes is well-controlled with your current medications, Mounjaro  15 mg and Metformin  500 mg daily. Your A1c has improved to 5.6, and your blood sugar levels have been stable. Continue your medications, maintain healthy eating habits, and exercise regularly.  -HYPERTENSION: Your blood pressure is well-controlled with your current medications: Amlodipine  10 mg, Hydralazine  25 mg (1.5 tablets three times a day), and Cozaar  50 mg daily. Continue taking these medications as prescribed.  -HYPERLIPIDEMIA: Your cholesterol levels are being managed with Rosuvastatin  20 mg. Your LDL cholesterol was 86, with a goal of less than 70. Your recent weight loss may help improve your cholesterol levels further. Continue taking Rosuvastatin  as prescribed.  -CHRONIC KIDNEY DISEASE, STAGE 3: Your chronic kidney disease is stable with a GFR in the 40s to 50s. No changes in your current management are needed.  -PULMONARY NODULE, RIGHT LOWER LOBE: We are monitoring a nodule in your right lung. A recent CT scan was done, and we are waiting for the results. The previous biopsy was negative for cancer. Follow up with Dr. Ruther, your pulmonologist, after we get the CT results.  -ATYPICAL CHEST PAIN: You have intermittent chest pain on the right side that is not related to exercise. We will monitor this, and if the pain changes or becomes related to exercise, we may refer you to a  cardiologist.  -HYPERCALCEMIA, ETIOLOGY UNCLEAR: You have high calcium  levels, and the cause is still unclear despite extensive testing. Continue to follow up with your endocrinologist for further evaluation.  -THYROID  NODULE, RIGHT SIDE: A nodule in your thyroid  is being monitored with ultrasound, and there are no new concerns at this time.  -FOOT CALLUS, RIGHT FOOT, BALL OF BIG TOE: You have a small callus on the ball of your right foot. We will refer you to a podiatrist for toenail care and callus management.  -BUNION, LEFT FOOT: You have a bunion on your left foot that is not inflamed. We will refer you to a podiatrist for evaluation and management.  INSTRUCTIONS:  Please continue your current medications and lifestyle habits. Follow up with Dr. Ruther, your pulmonologist, after we receive the CT scan results. Additionally, schedule an appointment with a podiatrist for your foot care. If you experience any changes in your chest pain or other symptoms, please contact us  immediately.

## 2024-01-22 NOTE — Progress Notes (Signed)
 Patient ID: Sarah [name], female    DOB: [birth year]  MRN: [id number]  CC: Diabetes (DM f/u. /No questions / concerns/)   Subjective: Sarah [name] is a 58 y.o. female who presents for chronic ds management. 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 (S/p adjuvant chemotherapy completed on 12/08/14. S/p ex lap, hernia repair and small bowel resection for a presumed recurrence (benign pathology), Hypercalcemia with elevated free light chains (BM bx Dr. Lanny negative, no need for further f/u), dx with [facility] by Dr. Kassie, 1.1 cm RLL nodule (bx negative 01/2020), RT thyroid  nodule (does not met criteria for bx 10/2019),         Discussed the use of AI scribe software for clinical note transcription with the patient, who gave verbal consent to proceed.  History of Present Illness Sarah [name] is a 58 year old female with diabetes, hypertension, and hyperlipidemia who presents for a four-month follow-up.  DM: Results for orders placed or performed in visit on 01/22/24  POCT glucose (manual entry)   Collection Time: 01/22/24  8:51 AM  Result Value Ref Range   POC Glucose 127 (A) 70 - 99 mg/dl  POCT glycosylated hemoglobin (Hb A1C)   Collection Time: 01/22/24  8:55 AM  Result Value Ref Range   Hemoglobin A1C     HbA1c POC (<> result, manual entry)     HbA1c, POC (prediabetic range)     HbA1c, POC (controlled diabetic range) 5.6 0.0 - 7.0 %   *Note: Due to a large number of results and/or encounters for the requested time period, some results have not been displayed. A complete set of results can be found in Results Review.  Her diabetes management includes Mounjaro  15 mg and metformin  500 mg daily. Her hemoglobin A1c has improved to 5.6 from 6.0 in February, and her blood sugar this morning was 127. Her continuous  glucose monitor has shown blood sugars under 90 for the past three months, although she did not bring the reader to the appointment. She has lost 20 pounds since her last visit 4 mths ago, now weighing 186 pounds, and maintains good eating habits and exercises three times a week at gym doing low intensity exercise 2 days then aerobics one day. Has diabetic eye exam scheduled for September of this year.  HTN: she is on amlodipine  10 mg, hydralazine  50 mg  three times a day), and Cozaar  50 mg daily.  In the states intermittent sharp, nonexertional right sided chest pains that lasts a quick second for the past 1 month.  Pain does not radiate down the arm or up the neck.  Reports some shortness of breath at times when she sings in church.  Denies any chest pains when she is exercising at the gym.    HL: she continues on rosuvastatin  20 mg. Her last LDL cholesterol was 86 in February, with a goal of less than 70.  Hypercalcemia: Calcium  level remains between 11-12.2.  She has undergone additional workup  by Dr. Irean including normal parathyroid , thyroid , vitamin D , calcitonin and 24-hour urine collection.  She has also had multiple myeloma workup done by oncologist Dr. Lanny with nl bone marrow biopsy.  Reason for the hypercalcemia remains questionable.  She has right lower lung nodule which was biopsied several years ago and was negative.  Had recent CT of the chest for follow-up on this but results are not back as yet.  This was ordered by pulmonologist Dr. Shelah.        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 02/22/2023   Chronic cough 02/05/2023   Gastroesophageal reflux disease 08/28/2022   Opioid use agreement exists 07/21/2022   Degenerative disc disease, thoracic 07/21/2022   Degenerative disc disease, lumbar 07/21/2022   Essential hypertension 07/10/2022   Multiple thyroid   nodules 06/21/2022   Type 2 diabetes mellitus with obesity (HCC) 05/28/2022   Environmental allergies 05/28/2022   Class 2 severe obesity with serious comorbidity and body mass index (BMI) of 37.0 to 37.9 in adult (HCC) 05/28/2022   H/O gastroesophageal reflux (GERD) 05/28/2022   Hypomagnesemia 02/04/2022   Mixed stress and urge urinary incontinence 10/04/2021   S/P hernia repair 07/03/2021   Atherosclerosis of aorta (HCC) 05/28/2021   Mood disorder (HCC) with emotional eating 05/21/2021   Cutaneous abscess of back excluding buttocks 01/12/2021   Depressed mood, with emotional eating 10/23/2020   Seasonal allergies 09/05/2020   Class 2 severe obesity with serious comorbidity and body mass index (BMI) of 36.0 to 36.9 in adult (HCC) 07/24/2020   At risk for impaired metabolic function 06/12/2020   Hypertension associated with type 2 diabetes mellitus (HCC) 05/15/2020   Stage 3a chronic kidney disease (CKD) (HCC) 05/01/2020   Type 2 diabetes mellitus with chronic kidney disease, with long-term current use of insulin  (HCC) 04/17/2020   At risk for hypoglycemia 04/17/2020   B12 deficiency 02/29/2020   Diabetes mellitus (HCC) 02/15/2020   Vitamin D  deficiency 02/15/2020   Other insomnia 01/24/2020   Morbid obesity (HCC) 01/24/2020   Elevated serum immunoglobulin free light chains 01/21/2020   Thyroid  nodule 10/14/2019   Renal insufficiency 06/24/2019   Hypercalcemia 06/24/2019   Hiatal hernia with GERD without esophagitis 06/18/2017   Need for influenza vaccination 02/17/2017   Ventral hernia 01/24/2016   Type 2 diabetes mellitus without complication, without long-term current use of insulin  (HCC) 10/04/2015   Generalized obesity 10/04/2015   Hyperlipidemia associated with type 2 diabetes mellitus (HCC) 07/27/2015   Neuropathic pain of both legs 07/27/2015   Obesity (BMI 30.0-34.9) 12/24/2014   Genetic testing 11/01/2014   Functional constipation 09/24/2014   Chemotherapy-induced  peripheral neuropathy (HCC) 09/24/2014   Midline low back pain without sciatica 09/24/2014   Family history of colon cancer    Hilar adenopathy    Hypertension associated with diabetes (HCC)    Pulmonary nodule 1 cm or greater in diameter      Current Outpatient Medications on File Prior to Visit  Medication Sig Dispense Refill   ACCU-CHEK GUIDE TEST test strip USE TO CHECK BLOOD SUGAR 3 TIMES DAILY. 300 strip 3   Accu-Chek Softclix Lancets lancets USE TO CHECK BLOOD SUGAR 3 TIMES DAILY. 300 each 3   albuterol  (VENTOLIN  HFA) 108 (90 Base) MCG/ACT inhaler Inhale 2 puffs into the lungs every 6 (six) hours as needed for wheezing or shortness of breath. 8 g 0   amLODipine  (NORVASC ) 10 MG tablet TAKE 1 TABLET EVERY DAY 90 tablet 3   Blood Glucose Monitoring Suppl (ACCU-CHEK GUIDE) w/Device KIT Use to check blood sugar 3 times daily. 1 kit 0   buPROPion  (WELLBUTRIN  SR) 200 MG 12 hr tablet Take 1 tablet (200 mg total) by mouth 2 (two) times daily.  180 tablet 0   Cholecalciferol (VITAMIN D3) 25 MCG (1000 UT) CAPS Take 1 capsule (1,000 Units total) by mouth daily. Per Endo in 10/2022 60 capsule    Continuous Glucose Receiver (FREESTYLE LIBRE 3 READER) DEVI USE AS DIRECTED 6 each 3   Continuous Glucose Sensor (FREESTYLE LIBRE 3 SENSOR) MISC USE AS DIRECTED TO CHECK BLOOD GLUCOSE CONTINUOUSLY - CHANGE SENSOR EVERY 14 DAYS 2 each 3   cyanocobalamin  (VITAMIN B12) 500 MCG tablet 500 mcg every other day     dorzolamide -timolol  (COSOPT ) 2-0.5 % ophthalmic solution Place 1 drop into both eyes 2 (two) times daily. 10 mL 3   DULoxetine  (CYMBALTA ) 20 MG capsule Take 1 capsule (20 mg total) by mouth daily. Start after tapering off Gabapentin  30 capsule 3   fluticasone  (FLONASE ) 50 MCG/ACT nasal spray USE 2 SPRAYS IN EACH NOSTRIL EVERY DAY 48 g 3   hydrALAZINE  (APRESOLINE ) 50 MG tablet Take 1 tablet (50 mg total) by mouth 3 (three) times daily. 270 tablet 3   Insulin  Pen Needle (DROPLET PEN NEEDLES) 29G X MISC  USE AS DIRECTED 100 each 0   Lancets Misc. (ACCU-CHEK SOFTCLIX LANCET DEV) KIT Check blood sugars 3 times a day. 3 kit 6   latanoprost  (XALATAN ) 0.005 % ophthalmic solution Place 1 drop into both eyes every evening at bedtime as directed 2.5 mL 6   levocetirizine (XYZAL ) 5 MG tablet Take 1 tablet (5 mg total) by mouth every evening. 90 tablet 0   lidocaine  (LIDODERM ) 5 % APPLY 1 PATCH ONTO THE SKIN DAILY. REMOVE AND DISCARD PATCH WITHIN 12 HOURS OR AS DIRECTED BY MD 90 patch 1   losartan  (COZAAR ) 50 MG tablet TAKE 1 TABLET EVERY DAY 90 tablet 2   metFORMIN  (GLUCOPHAGE ) 500 MG tablet TAKE 1 TABLET EVERY DAY WITH BREAKFAST 90 tablet 3   methocarbamol  (ROBAXIN ) 500 MG tablet Take 500 mg by mouth 2 (two) times daily.     montelukast  (SINGULAIR ) 10 MG tablet Take 1 tablet (10 mg total) by mouth at bedtime. 90 tablet 0   omega-3 acid ethyl esters (LOVAZA ) 1 g capsule Take 2 capsules (2 g total) by mouth 2 (two) times daily. 180 capsule 0   omeprazole  (PRILOSEC) 20 MG capsule Take 1 capsule (20 mg total) by mouth daily. 90 capsule 0   ondansetron  (ZOFRAN ) 4 MG tablet Take 1 tablet by mouth every 6 hours as needed severe nausea 30 tablet 0   rosuvastatin  (CRESTOR ) 20 MG tablet TAKE 1 TABLET EVERY DAY 90 tablet 3   solifenacin  (VESICARE ) 5 MG tablet TAKE 1 TABLET EVERY DAY 90 tablet 3   tirzepatide  (MOUNJARO ) 15 MG/0.5ML Pen Inject 15 mg into the skin once a week every Thursday 6 mL 0   triamcinolone  cream (KENALOG ) 0.1 % Apply 1 Application topically 2 (two) times daily. 30 g 0   No current facility-administered medications on file prior to visit.    Allergies  Allergen Reactions   Emend [Aprepitant] Other (See Comments)    Urticaria    Lisinopril  Cough    Social History   Socioeconomic History   Marital status: Married    Spouse name: Not on file   Number of children: 2   Years of education: Not on file   Highest education level: 12th grade  Occupational History   Occupation: disability   Tobacco Use   Smoking status: Never   Smokeless tobacco: Never  Vaping Use   Vaping status: Never Used  Substance and Sexual Activity  Alcohol use: No   Drug use: No   Sexual activity: Not Currently    Birth control/protection: None  Other Topics Concern   Not on file  Social History Narrative   No issues with transportation.    Attends church   Social Drivers of Health   Financial Resource Strain: Medium Risk (01/22/2024)   Overall Financial Resource Strain (CARDIA)    Difficulty of Paying Living Expenses: Somewhat hard  Food Insecurity: Food Insecurity Present (01/22/2024)   Hunger Vital Sign    Worried About Running Out of Food in the Last Year: Sometimes true    Ran Out of Food in the Last Year: Never true  Transportation Needs: No Transportation Needs (01/22/2024)   PRAPARE - Administrator, Civil Service (Medical): No    Lack of Transportation (Non-Medical): No  Physical Activity: Sufficiently Active (01/22/2024)   Exercise Vital Sign    Days of Exercise per Week: 3 days    Minutes of Exercise per Session: 50 min  Stress: Stress Concern Present (01/22/2024)   [employer] of Occupational Health - Occupational Stress Questionnaire    Feeling of Stress: To some extent  Social Connections: Moderately Integrated (01/22/2024)   Social Connection and Isolation Panel    Frequency of Communication with Friends and Family: More than three times a week    Frequency of Social Gatherings with Friends and Family: More than three times a week    Attends Religious Services: More than 4 times per year    Active Member of Clubs or Organizations: Yes    Attends Banker Meetings: More than 4 times per year    Marital Status: Separated  Intimate Partner Violence: Not At Risk (01/22/2024)   Humiliation, Afraid, Rape, and Kick questionnaire    Fear of Current or Ex-Partner: No    Emotionally Abused: No    Physically Abused: No    Sexually Abused: No     Family History  Problem Relation Age of Onset   Hypertension Mother    Hyperlipidemia Mother    Stroke Mother    Thyroid  disease Mother    Arthritis Mother    Hearing loss Mother    Hypertension Father    Diabetes Father    Hyperlipidemia Father    Sudden death Father    Arthritis Father    Heart disease Father    Obesity Father    Cancer Sister 34       fibrosarcoma (back); currently 49   Cancer Brother    Prostate cancer Maternal Uncle    Colon cancer Paternal Aunt        Dx 65s; deceased 79s   Prostate cancer Paternal Uncle        currently 21   Cancer Paternal Uncle 79       bone ; unk. primary   Stomach cancer Paternal Uncle    Hypercalcemia Neg Hx     Past Surgical History:  Procedure Laterality Date   ABDOMINAL HYSTERECTOMY     CATARACT EXTRACTION     RT eye 11/28/22 and LT eye 12/19/2022   FOOT SURGERY  04/2018   [facility]   HERNIA REPAIR     INCISIONAL HERNIA REPAIR N/A 07/06/2015   Procedure: INCISIONAL HERNIA REPAIR ;  Surgeon: Donnice Bury, MD;  Location: [facility];  Service: General;  Laterality: N/A;   INCISIONAL HERNIA REPAIR N/A 07/03/2021   Procedure: SHIRLENE HERNIA REPAIR WITH MESH;  Surgeon: Rubin Calamity, MD;  Location: [facility];  Service: General;  Laterality: N/A;   INSERTION OF MESH N/A 07/06/2015   Procedure: WITH INSERTION OF PHASIX ST MESH;  Surgeon: Donnice Bury, MD;  Location: [facility];  Service: General;  Laterality: N/A;   LAPAROTOMY N/A 07/06/2015   Procedure: EXPLORATORY LAPAROTOMY;  Surgeon: Maurilio Ship, MD;  Location: [facility];  Service: Gynecology;  Laterality: N/A;   LYSIS OF ADHESION N/A 07/06/2015   Procedure:  LYSIS OF ADHESION RESECTION OF MESENTERIC MASS BOWEL RESECTION ;  Surgeon: Maurilio Ship, MD;  Location: [facility];  Service: Gynecology;  Laterality: N/A;   LYSIS OF ADHESION N/A 07/03/2021   Procedure: EXPLORATORY LAPAROTOMY WITH LYSIS OF ADHESION;  Surgeon: Rubin Calamity, MD;  Location: [facility];  Service:  General;  Laterality: N/A;   ROBOTIC ASSISTED TOTAL HYSTERECTOMY WITH BILATERAL SALPINGO OOPHERECTOMY Bilateral 07/28/2014   Procedure: ROBOTIC ASSISTED lysis of adhesions with biopsies, converted to LAPAROTOMY, bilateral salpingoorphorectomy, omentectomy,appendectomy;  Surgeon: Sari Bachelor, MD;  Location: [facility];  Service: Gynecology;  Laterality: Bilateral;   THYROIDECTOMY, PARTIAL     VIDEO BRONCHOSCOPY N/A 01/13/2020   Procedure: VIDEO BRONCHOSCOPY WITH BIOPSIES;  Surgeon: Kerrin Elspeth BROCKS, MD;  Location: [facility];  Service: Thoracic;  Laterality: N/A;    ROS: Review of Systems Negative except as stated above  PHYSICAL EXAM: BP 125/78 (BP Location: Left Arm, Patient Position: Sitting, Cuff Size: Large)   Pulse 92   Temp 98.2 F (36.8 C) (Oral)   Ht 5' 5 (1.651 m)   Wt 186 lb (84.4 kg)   LMP  (LMP Unknown)   SpO2 97%   BMI 30.95 kg/m   Wt Readings from Last 3 Encounters:  01/22/24 186 lb (84.4 kg)  01/20/24 181 lb (82.1 kg)  01/07/24 184 lb (83.5 kg)    Physical Exam  General appearance - alert, well appearing, and in no distress Mental status - normal mood, behavior, speech, dress, motor activity, and thought processes Neck - supple, no significant adenopathy Chest - clear to auscultation, no wheezes, rales or rhonchi, symmetric air entry Heart - normal rate, regular rhythm, normal S1, S2, no murmurs, rubs, clicks or gallops Extremities - no LE edema Diabetic Foot Exam - Simple   Simple Foot Form Diabetic Foot exam was performed with the following findings: Yes 01/22/2024  9:22 AM  Visual Inspection See comments: Yes Sensation Testing Intact to touch and monofilament testing bilaterally: Yes Pulse Check Posterior Tibialis and Dorsalis pulse intact bilaterally: Yes Comments Bunion LT big toe; not inflam. Toenails hyperpigmented. Nails on 1st and 5th toes overgrown BL          Latest Ref Rng & Units 10/21/2023    9:26 AM 07/23/2023    9:25 AM 05/22/2023    11:40 AM  CMP  Glucose 65 - 99 mg/dL 897  88  92   BUN 7 - 25 mg/dL 17  19  18    Creatinine 0.50 - 1.03 mg/dL 8.89  8.64  8.73   Sodium 135 - 146 mmol/L 139  143  141   Potassium 3.5 - 5.3 mmol/L 4.1  4.4  4.7   Chloride 98 - 110 mmol/L 104  106  103   CO2 20 - 32 mmol/L 26  23  21    Calcium  8.6 - 10.4 mg/dL 87.6  88.5  88.2   Total Protein 6.1 - 8.1 g/dL 7.9  7.3    Total Bilirubin 0.2 - 1.2 mg/dL 0.6  0.5    Alkaline Phos 44 - 121 IU/L  82  AST 10 - 35 U/L 26  30    ALT 6 - 29 U/L 24  30     Lipid Panel     Component Value Date/Time   CHOL 161 07/23/2023 0925   TRIG 162 (H) 07/23/2023 0925   HDL 47 07/23/2023 0925   CHOLHDL 3.4 07/23/2023 0925   CHOLHDL 5.1 (H) 08/14/2016 1729   VLDL 74 (H) 08/14/2016 1729   LDLCALC 86 07/23/2023 0925    CBC    Component Value Date/Time   WBC 5.4 03/28/2022 1508   RBC 4.97 03/28/2022 1508   HGB 15.0 03/28/2022 1508   HGB 13.3 02/03/2020 0837   HGB 13.6 12/04/2018 0914   HGB 12.9 04/22/2016 0750   HCT 46.8 (H) 03/28/2022 1508   HCT 41.0 12/04/2018 0914   HCT 39.0 04/22/2016 0750   PLT 318 03/28/2022 1508   PLT 270 02/03/2020 0837   PLT 332 12/04/2018 0914   MCV 94.2 03/28/2022 1508   MCV 88 12/04/2018 0914   MCV 92.2 04/22/2016 0750   MCH 30.2 03/28/2022 1508   MCHC 32.1 03/28/2022 1508   RDW 13.6 03/28/2022 1508   RDW 13.6 12/04/2018 0914   RDW 13.9 04/22/2016 0750   LYMPHSABS 2.4 03/28/2022 1508   LYMPHSABS 2.7 04/22/2016 0750   MONOABS 0.8 03/28/2022 1508   MONOABS 1.0 (H) 04/22/2016 0750   EOSABS 0.1 03/28/2022 1508   EOSABS 0.1 04/22/2016 0750   BASOSABS 0.0 03/28/2022 1508   BASOSABS 0.0 04/22/2016 0750    ASSESSMENT AND PLAN: 1. Type 2 diabetes mellitus with obesity (HCC) (Primary) At goal - Continue Mounjaro  15 mg. - Continue Metformin  500 mg once daily. - Encourage continued healthy eating habits and regular exercise. - POCT glycosylated hemoglobin (Hb A1C) - POCT glucose (manual entry) - Ambulatory  referral to Podiatry  2. Long-term (current) use of injectable non-insulin  antidiabetic drugs 3. Diabetes mellitus treated with oral medication (HCC) See #1 above  4. Hypertension associated with type 2 diabetes mellitus (HCC) At goal. - Continue Amlodipine  10 mg. - Continue Hydralazine  50 mg, three times daily. - Continue Cozaar  50 mg daily.  5. Hyperlipidemia associated with type 2 diabetes mellitus (HCC) Continue Rosuvastatin  20 mg.  6. CKD stage 3a, GFR 45-59 ml/min (HCC) Stable.  Avoid NSAIDs.  7. Hypercalcemia Etiology remains unclear.  She has had workup from oncology standpoint and with endocrinology.  8. Atypical chest pain Seems atypical. Advise to let me know if any CP with exertion at which point we will refer to cardiology  9. Lung nodule Await results of recent CAT scan  10. Overgrown toenails - Ambulatory referral to Podiatry  Patient was given the opportunity to ask questions.  Patient verbalized understanding of the plan and was able to repeat key elements of the plan.   This documentation was completed using Paediatric nurse.  Any transcriptional errors are unintentional.  Orders Placed This Encounter  Procedures   Ambulatory referral to Podiatry   POCT glycosylated hemoglobin (Hb A1C)   POCT glucose (manual entry)     Requested Prescriptions    No prescriptions requested or ordered in this encounter    Return in about 4 months (around 05/23/2024) for chronic ds management.  Barnie Louder, MD, FACP

## 2024-01-28 ENCOUNTER — Ambulatory Visit (INDEPENDENT_AMBULATORY_CARE_PROVIDER_SITE_OTHER): Admitting: Podiatry

## 2024-01-28 ENCOUNTER — Encounter: Payer: Self-pay | Admitting: Podiatry

## 2024-01-28 DIAGNOSIS — M79674 Pain in right toe(s): Secondary | ICD-10-CM | POA: Diagnosis not present

## 2024-01-28 DIAGNOSIS — M79675 Pain in left toe(s): Secondary | ICD-10-CM | POA: Diagnosis not present

## 2024-01-28 DIAGNOSIS — G622 Polyneuropathy due to other toxic agents: Secondary | ICD-10-CM

## 2024-01-28 DIAGNOSIS — B351 Tinea unguium: Secondary | ICD-10-CM

## 2024-01-28 DIAGNOSIS — E119 Type 2 diabetes mellitus without complications: Secondary | ICD-10-CM

## 2024-01-28 NOTE — Progress Notes (Signed)
 This patient returns to my office for at risk foot care.  This patient requires this care by a professional since this patient will be at risk due to having diabetes and neuropathy. This patient is unable to cut nails herself since the patient cannot reach her nails.These nails are painful walking and wearing shoes.  This patient presents for at risk foot care today.  General Appearance  Alert, conversant and in no acute stress.  Vascular  Dorsalis pedis and posterior tibial  pulses are palpable  bilaterally.  Capillary return is within normal limits  bilaterally. Temperature is within normal limits  bilaterally.  Neurologic  Senn-Weinstein monofilament wire test diminished  bilaterally. Muscle power within normal limits bilaterally.  Nails Thick disfigured discolored nails with subungual debris  from hallux to fifth toes bilaterally. No evidence of bacterial infection or drainage bilaterally.  Orthopedic  No limitations of motion  feet .  No crepitus or effusions noted.  HAV  B/L.  Skin  normotropic skin with no porokeratosis noted bilaterally.  No signs of infections or ulcers noted.     Onychomycosis  Pain in right toes  Pain in left toes  Consent was obtained for treatment procedures.   Mechanical debridement of nails 1-5  bilaterally performed with a nail nipper.  Filed with dremel without incident.    Return office visit    3 months                  Told patient to return for periodic foot care and evaluation due to potential at risk complications.   Cordella Bold DPM

## 2024-02-01 ENCOUNTER — Other Ambulatory Visit: Payer: Self-pay | Admitting: Internal Medicine

## 2024-02-01 ENCOUNTER — Other Ambulatory Visit (INDEPENDENT_AMBULATORY_CARE_PROVIDER_SITE_OTHER): Payer: Self-pay | Admitting: Family Medicine

## 2024-02-01 DIAGNOSIS — K219 Gastro-esophageal reflux disease without esophagitis: Secondary | ICD-10-CM

## 2024-02-03 ENCOUNTER — Ambulatory Visit (INDEPENDENT_AMBULATORY_CARE_PROVIDER_SITE_OTHER): Admitting: Family Medicine

## 2024-02-03 ENCOUNTER — Encounter (INDEPENDENT_AMBULATORY_CARE_PROVIDER_SITE_OTHER): Payer: Self-pay | Admitting: Family Medicine

## 2024-02-03 VITALS — BP 114/74 | HR 85 | Temp 97.9°F | Ht 65.0 in | Wt 178.0 lb

## 2024-02-03 DIAGNOSIS — K5903 Drug induced constipation: Secondary | ICD-10-CM | POA: Diagnosis not present

## 2024-02-03 DIAGNOSIS — F39 Unspecified mood [affective] disorder: Secondary | ICD-10-CM | POA: Diagnosis not present

## 2024-02-03 DIAGNOSIS — Z6829 Body mass index (BMI) 29.0-29.9, adult: Secondary | ICD-10-CM

## 2024-02-03 DIAGNOSIS — I152 Hypertension secondary to endocrine disorders: Secondary | ICD-10-CM

## 2024-02-03 DIAGNOSIS — F5089 Other specified eating disorder: Secondary | ICD-10-CM | POA: Diagnosis not present

## 2024-02-03 DIAGNOSIS — E1159 Type 2 diabetes mellitus with other circulatory complications: Secondary | ICD-10-CM | POA: Diagnosis not present

## 2024-02-03 DIAGNOSIS — E669 Obesity, unspecified: Secondary | ICD-10-CM

## 2024-02-03 DIAGNOSIS — E66811 Obesity, class 1: Secondary | ICD-10-CM

## 2024-02-03 DIAGNOSIS — R911 Solitary pulmonary nodule: Secondary | ICD-10-CM | POA: Diagnosis not present

## 2024-02-03 DIAGNOSIS — Z7985 Long-term (current) use of injectable non-insulin antidiabetic drugs: Secondary | ICD-10-CM

## 2024-02-03 DIAGNOSIS — E1169 Type 2 diabetes mellitus with other specified complication: Secondary | ICD-10-CM | POA: Diagnosis not present

## 2024-02-03 DIAGNOSIS — Z7984 Long term (current) use of oral hypoglycemic drugs: Secondary | ICD-10-CM

## 2024-02-03 MED ORDER — POLYETHYLENE GLYCOL 3350 17 GM/SCOOP PO POWD: 17.0000 g | Freq: Two times a day (BID) | ORAL | Status: AC | PRN

## 2024-02-03 NOTE — Progress Notes (Signed)
 Sarah [name], D.O.  ABFM, ABOM Specializing in Clinical Bariatric Medicine  Office located at: [address] [place], [place]  [address]   Assessment and Plan:   Meds ordered this encounter  Medications   polyethylene glycol powder (GLYCOLAX /MIRALAX ) 17 GM/SCOOP powder    Sig: Take 17 g by mouth 2 (two) times daily as needed.     FOR THE DISEASE OF OBESITY:  Obesity (BMI 30.0-34.9) current 29.62 Obesity with starting BMI 37.9/date 02/15/2020 Assessment & Plan: Since last office visit on 01/20/2024 patient's muscle mass has decreased by 1.6 lbs. Fat mass has decreased by 1.8 lbs. Total body water  has decreased by 0.4 lbs.  Body fat % has decreased by 0.3 %. Counseling done on how various foods will affect these numbers and how to maximize success  Total lbs lost to date: -50 lbs Total weight loss percentage to date: -21.93 %   Recommended Dietary Goals Zeyna is currently in the action stage of change. As such, her goal is to continue weight management plan.  She has agreed to: continue current plan   Behavioral Intervention We discussed the following today: the concept of food is medicine, different lean protein sources, increasing lean protein intake to established goals, avoiding skipping meals, and increasing water  intake   Additional resources provided today: n/a  Evidence-based interventions for health behavior change were utilized today including the discussion of self monitoring techniques, problem-solving barriers and SMART goal setting techniques.   Regarding patient's less desirable eating habits and patterns, we employed the technique of small changes.   Goal: increase water  intake to 90+ oz per day.    Recommended Physical Activity Goals Dandrea has been advised to work up to 300-450 minutes of moderate intensity aerobic activity a week and strengthening exercises 2-3 times per week for cardiovascular health, weight loss maintenance and preservation  of muscle mass.   She was encouraged to Continue to gradually increase the amount and intensity of exercise routine   Pharmacotherapy Cont same regimen   ASSOCIATED CONDITIONS ADDRESSED TODAY:   Type 2 diabetes mellitus with obesity (HCC) Drug-induced constipation Assessment & Plan: Lab Results  Component Value Date   HGBA1C 5.6 01/22/2024   HGBA1C 6.0 (H) 07/23/2023   HGBA1C 6.4 04/23/2023    PCP rechecked her HgbA1c on 01/22/2024, it improved from 6.0 to 5.6. Her fasting blood sugars have been averaging 60s-90s.Denies symptoms of hypoglycemia or hyperglycemia. On Metformin  500 mg daily and max dose Mounjaro  with good adherence. She reports experiencing constipation from the Mounjaro . She admits to not drinking enough water . Good control of hunger and cravings.   Cont all meds. Encouraged adequate hydration (goal: 90+ ounces of water  daily.  She was also advised to begin Miralax  1-2 times daily as needed. Continue to decrease simple carbs/ sugars; increase fiber and proteins -> follow her meal plan.      Hypertension associated with type 2 diabetes mellitus [facility]) Assessment & Plan: BP Readings from Last 3 Encounters:  02/03/24 114/74  01/22/24 125/78  01/20/24 115/77   The 10-year ASCVD risk score (Arnett DK, et al., 2019) is: 9%  Lab Results  Component Value Date   CREATININE 1.10 (H) 10/21/2023   BP at goal. Cont Norvasc  10 mg daily, Losartan  50 mg daily, Hydralazine  50 mg TID. Continue to work on low sodium nutrition plan.    Depressed mood (HCC) with emotional eating Assessment & Plan: She states her mood has been well-controlled on Wellbutrin  SR 200 mg two times  daily. She is happy with her weight loss progress. Continue regimen and prudent nutritional plan.     Pulmonary nodule Assessment & Plan: Reviewed CT of chest dated 01/14/2024. Pulmonary nodule was stable. Cont to f/up with pulmonology as directed.    Follow up:   Return 02/24/2024 at  2:20 PM.   She was informed of the importance of frequent follow up visits to maximize her success with intensive lifestyle modifications for her multiple health conditions.   Subjective:   Chief complaint: Obesity Sarah [name] is here to discuss her progress with her obesity treatment plan. She is journaling 1300-1400 cal and 85+g protein with the Category 2 MP as a guide  and states she is following her eating plan approximately 60% of the time. She states she is doing silver sneakers 50 minutes 3 days per week, and walking  30 minutes 2 days per week    Interval History:  Sarah [name] is here for a follow up office visit. Since LOV on 01/20/2024 , she is down 3 lbs.She is skipping less meals compared to LOV. She still struggles to get in all her recommended amounts of lean proteins. From a mood standpoint, she has been doing well.    Pharmacotherapy that aid with weight loss: She is currently taking Wellbutrin  SR 200 mg BID, Metformin  500 mg daily and max dose Mounjaro ..    Review of Systems:  Pertinent positives were addressed with patient today.  Reviewed by clinician on day of visit: allergies, medications, problem list, medical history, surgical history, family history, social history, and previous encounter notes.  Weight Summary and Biometrics   Weight Lost Since Last Visit: 3lb  Weight Gained Since Last Visit: 0  Vitals Temp: 97.9 F (36.6 C) BP: 114/74 Pulse Rate: 85 SpO2: 95 %   Anthropometric Measurements Height: 5' 5 (1.651 m) Weight: 178 lb (80.7 kg) BMI (Calculated): 29.62 Weight at Last Visit: 181lb Weight Lost Since Last Visit: 3lb Weight Gained Since Last Visit: 0 Starting Weight: 228lb Total Weight Loss (lbs): 50 lb (22.7 kg) Peak Weight: 228lb   Body Composition  Body Fat %: 37.8 % Fat Mass (lbs): 67.4 lbs Muscle Mass (lbs): 105.2 lbs Total Body Water  (lbs): 68.2 lbs Visceral Fat Rating : 9   Other Clinical Data Fasting: no Labs: no Today's Visit #:  88 Starting Date: 02/15/20    Objective:   PHYSICAL EXAM: Blood pressure 114/74, pulse 85, temperature 97.9 F (36.6 C), height 5' 5 (1.651 m), weight 178 lb (80.7 kg), SpO2 95%. Body mass index is 29.62 kg/m.  General: she is overweight, cooperative and in no acute distress. PSYCH: Has normal mood, affect and thought process.   HEENT: EOMI, sclerae are anicteric. Lungs: Normal breathing effort, no conversational dyspnea. Extremities: Moves * 4 Neurologic: A and O * 3, good insight  DIAGNOSTIC DATA REVIEWED: BMET    Component Value Date/Time   NA 139 10/21/2023 0926   NA 143 07/23/2023 0925   NA 138 04/22/2016 0750   K 4.1 10/21/2023 0926   K 3.9 04/22/2016 0750   CL 104 10/21/2023 0926   CO2 26 10/21/2023 0926   CO2 26 04/22/2016 0750   GLUCOSE 102 (H) 10/21/2023 0926   GLUCOSE 142 (H) 04/22/2016 0750   BUN 17 10/21/2023 0926   BUN 19 07/23/2023 0925   BUN 19.1 11/20/2016 1355   CREATININE 1.10 (H) 10/21/2023 0926   CREATININE 1.1 11/20/2016 1355   CALCIUM  12.3 (H) 10/21/2023 0926   CALCIUM  9.8 04/22/2016  0750   GFRNONAA 47 (L) 03/28/2022 1508   GFRNONAA 48 (L) 12/29/2019 1015   GFRNONAA 63 07/08/2014 1212   GFRAA 69 04/17/2020 0928   GFRAA 56 (L) 12/29/2019 1015   GFRAA 73 07/08/2014 1212   Lab Results  Component Value Date   HGBA1C 5.6 01/22/2024   HGBA1C 10.1 (H) 07/07/2015   No results found for: INSULIN  Lab Results  Component Value Date   TSH 1.16 10/21/2023   CBC    Component Value Date/Time   WBC 5.4 03/28/2022 1508   RBC 4.97 03/28/2022 1508   HGB 15.0 03/28/2022 1508   HGB 13.3 02/03/2020 0837   HGB 13.6 12/04/2018 0914   HGB 12.9 04/22/2016 0750   HCT 46.8 (H) 03/28/2022 1508   HCT 41.0 12/04/2018 0914   HCT 39.0 04/22/2016 0750   PLT 318 03/28/2022 1508   PLT 270 02/03/2020 0837   PLT 332 12/04/2018 0914   MCV 94.2 03/28/2022 1508   MCV 88 12/04/2018 0914   MCV 92.2 04/22/2016 0750   MCH 30.2 03/28/2022 1508   MCHC 32.1  03/28/2022 1508   RDW 13.6 03/28/2022 1508   RDW 13.6 12/04/2018 0914   RDW 13.9 04/22/2016 0750   Iron Studies    Component Value Date/Time   IRON 43 08/18/2010 0515   TIBC 389 08/18/2010 0515   FERRITIN 227 08/18/2010 0515   IRONPCTSAT 11 (L) 08/18/2010 0515   Lipid Panel     Component Value Date/Time   CHOL 161 07/23/2023 0925   TRIG 162 (H) 07/23/2023 0925   HDL 47 07/23/2023 0925   CHOLHDL 3.4 07/23/2023 0925   CHOLHDL 5.1 (H) 08/14/2016 1729   VLDL 74 (H) 08/14/2016 1729   LDLCALC 86 07/23/2023 0925   Hepatic Function Panel     Component Value Date/Time   PROT 7.9 10/21/2023 0926   PROT 7.3 07/23/2023 0925   PROT 8.2 04/22/2016 0750   ALBUMIN 4.6 07/23/2023 0925   ALBUMIN 3.8 04/22/2016 0750   AST 26 10/21/2023 0926   AST 29 12/29/2019 1015   AST 27 04/22/2016 0750   ALT 24 10/21/2023 0926   ALT 33 12/29/2019 1015   ALT 24 04/22/2016 0750   ALKPHOS 82 07/23/2023 0925   ALKPHOS 74 04/22/2016 0750   BILITOT 0.6 10/21/2023 0926   BILITOT 0.5 07/23/2023 0925   BILITOT 0.6 12/29/2019 1015   BILITOT 0.44 04/22/2016 0750   BILIDIR 0.2 08/18/2010 0515   IBILI 1.5 (H) 08/18/2010 0515      Component Value Date/Time   TSH 1.16 10/21/2023 0926   Nutritional Lab Results  Component Value Date   VD25OH 38 10/21/2023   VD25OH 33.96 04/23/2023   VD25OH 29.26 (L) 10/21/2022    Attestations:   I, Special Puri, acting as a Stage manager for Sarah Jenkins, DO., have compiled all relevant documentation for today's office visit on behalf of Sarah Jenkins, DO, while in the presence of [employer], DO.  I have reviewed the above documentation for accuracy and completeness, and I agree with the above. Sarah JINNY [name], D.O.  The 21st Century Cures Act was signed into law in 2016 which includes the topic of electronic health records.  This provides immediate access to information in MyChart.  This includes consultation notes, operative notes, office notes, lab  results and pathology reports.  If you have any questions about what you read please let us  know at your next visit so we can discuss your concerns and take corrective action if  need be.  We are right here with you.

## 2024-02-12 ENCOUNTER — Other Ambulatory Visit: Payer: Self-pay | Admitting: Internal Medicine

## 2024-02-12 DIAGNOSIS — G620 Drug-induced polyneuropathy: Secondary | ICD-10-CM

## 2024-02-13 DIAGNOSIS — H401133 Primary open-angle glaucoma, bilateral, severe stage: Secondary | ICD-10-CM | POA: Diagnosis not present

## 2024-02-13 DIAGNOSIS — Z961 Presence of intraocular lens: Secondary | ICD-10-CM | POA: Diagnosis not present

## 2024-02-13 DIAGNOSIS — E119 Type 2 diabetes mellitus without complications: Secondary | ICD-10-CM | POA: Diagnosis not present

## 2024-02-13 DIAGNOSIS — Z794 Long term (current) use of insulin: Secondary | ICD-10-CM | POA: Diagnosis not present

## 2024-02-13 NOTE — Telephone Encounter (Signed)
 Requested Prescriptions  Pending Prescriptions Disp Refills   DULoxetine  (CYMBALTA ) 20 MG capsule [Pharmacy Med Name: DULOXETINE  HYDROCHLORIDEDR 20 MG Oral Capsule Delayed Release Particles] 90 capsule 0    Sig: TAKE 1 CAPSULE DAILY. START AFTER TAPERING OFF GABAPENTIN      Psychiatry: Antidepressants - SNRI - duloxetine  Failed - 02/13/2024  8:45 AM      Failed - Cr in normal range and within 360 days    Creatinine  Date Value Ref Range Status  11/20/2016 1.1 0.6 - 1.1 mg/dL Final   Creat  Date Value Ref Range Status  10/21/2023 1.10 (H) 0.50 - 1.03 mg/dL Final   Creatinine, POC  Date Value Ref Range Status  11/13/2016 100 mg/dL Final   Creatinine, Urine  Date Value Ref Range Status  07/27/2015 174 20 - 320 mg/dL Final         Passed - eGFR is 30 or above and within 360 days    GFR, Est African American  Date Value Ref Range Status  07/08/2014 73 mL/min Final   GFR, Est AFR Am  Date Value Ref Range Status  12/29/2019 56 (L) >60 mL/min Final   GFR calc Af Amer  Date Value Ref Range Status  04/17/2020 69 >59 mL/min/1.73 Final    Comment:    **In accordance with recommendations from the NKF-ASN Task force,**   [facility] is in the process of updating its eGFR calculation to the   2021 CKD-EPI creatinine equation that estimates kidney function   without a race variable.    GFR, Est Non African American  Date Value Ref Range Status  07/08/2014 63 mL/min Final    Comment:      The estimated GFR is a calculation valid for adults (>=73 years old) that uses the CKD-EPI algorithm to adjust for age and sex. It is   not to be used for children, pregnant women, hospitalized patients,    patients on dialysis, or with rapidly changing kidney function. According to the NKDEP, eGFR >89 is normal, 60-89 shows mild impairment, 30-59 shows moderate impairment, 15-29 shows severe impairment and <15 is ESRD.      GFR, Estimated  Date Value Ref Range Status  03/28/2022 47 (L) >60  mL/min Final    Comment:    (NOTE) Calculated using the CKD-EPI Creatinine Equation (2021)   12/29/2019 48 (L) >60 mL/min Final   eGFR  Date Value Ref Range Status  10/21/2023 59 (L) > OR = 60 mL/min/1.50m2 Final  07/23/2023 46 (L) >59 mL/min/1.73 Final         Passed - Completed PHQ-2 or PHQ-9 in the last 360 days      Passed - Last BP in normal range    BP Readings from Last 1 Encounters:  02/03/24 114/74         Passed - Valid encounter within last 6 months    Recent Outpatient Visits           3 weeks ago Type 2 diabetes mellitus with obesity (HCC)   [facility] [place]. [facility] Vicci Sober B, MD   4 months ago Type 2 diabetes mellitus with obesity [facility])   [facility] [place]. [facility] Vicci Sober B, MD   8 months ago Type 2 diabetes mellitus with obesity [facility])   [facility] Dept Of [place]. [facility] Vicci Sober  B, MD   1 year ago Type 2 diabetes mellitus with obesity (HCC)   [facility] Of [place]. [facility] Vicci Barnie NOVAK, MD   1 year ago Type 2 diabetes mellitus with obesity [facility])   [facility] [place] [facility] Of [place]. [facility] Vicci Barnie NOVAK, MD

## 2024-02-24 ENCOUNTER — Ambulatory Visit (INDEPENDENT_AMBULATORY_CARE_PROVIDER_SITE_OTHER): Admitting: Family Medicine

## 2024-02-24 ENCOUNTER — Encounter (INDEPENDENT_AMBULATORY_CARE_PROVIDER_SITE_OTHER): Payer: Self-pay | Admitting: Family Medicine

## 2024-02-24 VITALS — BP 110/74 | HR 66 | Temp 98.4°F | Ht 65.0 in | Wt 176.0 lb

## 2024-02-24 DIAGNOSIS — F5089 Other specified eating disorder: Secondary | ICD-10-CM | POA: Diagnosis not present

## 2024-02-24 DIAGNOSIS — F39 Unspecified mood [affective] disorder: Secondary | ICD-10-CM

## 2024-02-24 DIAGNOSIS — Z6829 Body mass index (BMI) 29.0-29.9, adult: Secondary | ICD-10-CM | POA: Diagnosis not present

## 2024-02-24 DIAGNOSIS — Z7984 Long term (current) use of oral hypoglycemic drugs: Secondary | ICD-10-CM

## 2024-02-24 DIAGNOSIS — Z7985 Long-term (current) use of injectable non-insulin antidiabetic drugs: Secondary | ICD-10-CM

## 2024-02-24 DIAGNOSIS — K5903 Drug induced constipation: Secondary | ICD-10-CM

## 2024-02-24 DIAGNOSIS — Z9109 Other allergy status, other than to drugs and biological substances: Secondary | ICD-10-CM

## 2024-02-24 DIAGNOSIS — E1169 Type 2 diabetes mellitus with other specified complication: Secondary | ICD-10-CM | POA: Diagnosis not present

## 2024-02-24 DIAGNOSIS — E66811 Obesity, class 1: Secondary | ICD-10-CM

## 2024-02-24 DIAGNOSIS — E669 Obesity, unspecified: Secondary | ICD-10-CM

## 2024-02-24 DIAGNOSIS — E1159 Type 2 diabetes mellitus with other circulatory complications: Secondary | ICD-10-CM | POA: Diagnosis not present

## 2024-02-24 DIAGNOSIS — I152 Hypertension secondary to endocrine disorders: Secondary | ICD-10-CM

## 2024-02-24 MED ORDER — BUPROPION HCL ER (SR) 200 MG PO TB12: 200.0000 mg | ORAL_TABLET | Freq: Two times a day (BID) | ORAL | 0 refills | Status: AC

## 2024-02-24 MED ORDER — LEVOCETIRIZINE DIHYDROCHLORIDE 5 MG PO TABS: 5.0000 mg | ORAL_TABLET | Freq: Every evening | ORAL | 0 refills | Status: AC

## 2024-02-24 NOTE — Progress Notes (Signed)
 Sarah [name], D.O.  ABFM, ABOM Specializing in Clinical Bariatric Medicine  Office located at: [address] [place], [place]  [address]   Assessment and Plan:   Medications Discontinued During This Encounter  Medication Reason   levocetirizine (XYZAL ) 5 MG tablet Reorder   buPROPion  (WELLBUTRIN  SR) 200 MG 12 hr tablet Reorder     Meds ordered this encounter  Medications   buPROPion  (WELLBUTRIN  SR) 200 MG 12 hr tablet    Sig: Take 1 tablet (200 mg total) by mouth 2 (two) times daily.    Dispense:  180 tablet    Refill:  0   levocetirizine (XYZAL ) 5 MG tablet    Sig: Take 1 tablet (5 mg total) by mouth every evening.    Dispense:  90 tablet    Refill:  0      FOR THE DISEASE OF OBESITY:  Obesity with starting BMI 37.9/date 02/15/2020 Obesity (BMI 30.0-34.9) current 29.29 Assessment & Plan: Since last office visit on 02/03/24 patient's muscle mass has increased by 0.6 lbs. Fat mass has decreased by 2.8 lbs. Total body water  has increased by 0.6 lbs.  Body fat % has decreased by 1.1 %. Counseling done on how various foods will affect these numbers and how to maximize success  Total lbs lost to date: - 52 lbs Total weight loss percentage to date: -22.81 %  - Tracking Calories/Macros: yes  - Eating More Whole Foods: yes  - Adequate Protein Intake: no   - Adequate Water  Intake: yes  - Skipping Meals: yes  - Sleeping 7-9 Hours/ Night: no     Recommended Dietary Goals Sarah [name] is currently in the action stage of change. As such, her goal is to continue weight management plan.  She has agreed to: journaling 1300-1400 cal and 85+g protein with the Category 2 MP as a guide   Behavioral Intervention We discussed the following today: increasing lean protein intake to established goals, avoiding skipping meals, and work on meal planning and preparation, break meals into smaller portions  Additional resources provided today: None  Evidence-based interventions for  health behavior change were utilized today including the discussion of self monitoring techniques, problem-solving barriers and SMART goal setting techniques.   Regarding patient's less desirable eating habits and patterns, we employed the technique of small changes.   Goal(s) for next OV: Increase lean protein, eat smaller meals throughout the day.    Recommended Physical Activity Goals Sarah [name] has been advised to work up to 300-450 minutes of moderate intensity aerobic activity a week and strengthening exercises 2-3 times per week for cardiovascular health, weight loss maintenance and preservation of muscle mass.   She has agreed to: Think about enjoyable ways to increase daily physical activity and overcoming barriers to exercise, Increase physical activity in their day and reduce sedentary time (increase NEAT)., and Increase volume of physical activity to a goal of 240 minutes a week   Pharmacotherapy We both agreed to: Continue with current nutritional and behavioral strategies and weight loss medication.   ASSOCIATED CONDITIONS ADDRESSED TODAY:  Drug-induced constipation Type 2 diabetes mellitus with obesity [facility]) Assessment & Plan Lab Results  Component Value Date   HGBA1C 5.6 01/22/2024   HGBA1C 6.0 (H) 07/23/2023   HGBA1C 6.4 04/23/2023     On Metformin  500 mg daily and max dose Mounjaro  with good adherence and tolerance. Hunger and cravings are well controlled. Pt states checking her sugar at home with her sugar being at 92 today. On  8/14 A1C was 5.6 which is at goal. Pt is checking glucose levels at home. Educated pt that normal fasting sugars should be 60-99. Continue taking Metformin  and Mounjaro .  Pt states that she is not taking miralax  every day just when needed. She reports that constipation is very well controlled with higher hydration. Recommended to increase exercise in order to help as well.     Hypertension associated with type 2 diabetes mellitus  [facility]) Assessment & Plan BP Readings from Last 3 Encounters:  02/24/24 110/74  02/03/24 114/74  01/22/24 125/78   The 10-year ASCVD risk score (Arnett DK, et al., 2019) is: 8.1%  Lab Results  Component Value Date   CREATININE 1.10 (H) 10/21/2023   On Norvasc  10 mg daily, Losartan  50 mg daily, Hydralazine  50 mg TID. BP is at goal today. Educated pt on how increasing fat shed with help BP decrease. Continue taking medications and decreasing sodium intake.     Depressed mood (HCC) with emotional eating Assessment & Plan On Wellbutrin  SR 200 mg two times daily. Pt states that hunger and cravings are well controlled. She endorses not getting stressed out and letting stuff go. Continue regimen, will refill today.     Environmental allergies Assessment & Plan Pt states that her allergies are starting to flair up now that the summer has ended. Pt is not  taking Singulair  10 mg. Will refill Zyxal today. Continue taking as prescribed. Recommended pt to do sinus rinses to help evacuated polled from sinuses and reduce symptoms.     Follow up:   Return 03/09/24 at 8:00 AM She was informed of the importance of frequent follow up visits to maximize her success with intensive lifestyle modifications for her multiple health conditions.    Subjective:    Chief complaint: Obesity Sarah [name] is here to discuss her progress with her obesity treatment plan. She is journaling 1300-1400 cal and 85+g protein with the Category 2 MP as a guide and states she is following her eating plan approximately 80% of the time. [facility]  30-50 minutes 5 days per week    Interval History:  Sarah [name] is here for a follow up office visit. Pt has experienced a weight loss of 2 lbs since last OV on 02/03/24.Pt states that when she is journaling she can see that she is not eating enough protein. She endorses not being super hungry and has a plan on what she is going to cook day  by day but doe not meal prep. She is trying not to skip meals and states that she feels better with this weight loss program.     Pharmacotherapy that aid with weight loss: She is currently taking Wellbutrin  SR 200 mg BID, Metformin  500 mg daily and Mounjaro  15 mg weekly.    Review of Systems:  Pertinent positives were addressed with patient today.  Reviewed by clinician on day of visit: allergies, medications, problem list, medical history, surgical history, family history, social history, and previous encounter notes.  Weight Summary and Biometrics   Weight Lost Since Last Visit: 2lb  Weight Gained Since Last Visit: 0lb   Vitals Temp: 98.4 F (36.9 C) BP: 110/74 Pulse Rate: 66 SpO2: 100 %   Anthropometric Measurements Height: 5' 5 (1.651 m) Weight: 176 lb (79.8 kg) BMI (Calculated): 29.29 Weight at Last Visit: 178lb Weight Lost Since Last Visit: 2lb Weight Gained Since Last Visit: 0lb Starting Weight: 228lb Total Weight Loss (lbs):  52 lb (23.6 kg) Peak Weight: 228lb   Body Composition  Body Fat %: 36.7 % Fat Mass (lbs): 64.6 lbs Muscle Mass (lbs): 105.8 lbs Total Body Water  (lbs): 68.8 lbs Visceral Fat Rating : 9   Other Clinical Data Fasting: no Labs: no Today's Visit #: 89 Starting Date: 02/15/20    Objective:   PHYSICAL EXAM: Blood pressure 110/74, pulse 66, temperature 98.4 F (36.9 C), height 5' 5 (1.651 m), weight 176 lb (79.8 kg), SpO2 100%. Body mass index is 29.29 kg/m.  General: she is overweight, cooperative and in no acute distress. PSYCH: Has normal mood, affect and thought process.   HEENT: EOMI, sclerae are anicteric. Lungs: Normal breathing effort, no conversational dyspnea. Extremities: Moves * 4 Neurologic: A and O * 3, good insight  DIAGNOSTIC DATA REVIEWED: BMET    Component Value Date/Time   NA 139 10/21/2023 0926   NA 143 07/23/2023 0925   NA 138 04/22/2016 0750   K 4.1 10/21/2023 0926   K 3.9 04/22/2016 0750    CL 104 10/21/2023 0926   CO2 26 10/21/2023 0926   CO2 26 04/22/2016 0750   GLUCOSE 102 (H) 10/21/2023 0926   GLUCOSE 142 (H) 04/22/2016 0750   BUN 17 10/21/2023 0926   BUN 19 07/23/2023 0925   BUN 19.1 11/20/2016 1355   CREATININE 1.10 (H) 10/21/2023 0926   CREATININE 1.1 11/20/2016 1355   CALCIUM  12.3 (H) 10/21/2023 0926   CALCIUM  9.8 04/22/2016 0750   GFRNONAA 47 (L) 03/28/2022 1508   GFRNONAA 48 (L) 12/29/2019 1015   GFRNONAA 63 07/08/2014 1212   GFRAA 69 04/17/2020 0928   GFRAA 56 (L) 12/29/2019 1015   GFRAA 73 07/08/2014 1212   Lab Results  Component Value Date   HGBA1C 5.6 01/22/2024   HGBA1C 10.1 (H) 07/07/2015   No results found for: INSULIN  Lab Results  Component Value Date   TSH 1.16 10/21/2023   CBC    Component Value Date/Time   WBC 5.4 03/28/2022 1508   RBC 4.97 03/28/2022 1508   HGB 15.0 03/28/2022 1508   HGB 13.3 02/03/2020 0837   HGB 13.6 12/04/2018 0914   HGB 12.9 04/22/2016 0750   HCT 46.8 (H) 03/28/2022 1508   HCT 41.0 12/04/2018 0914   HCT 39.0 04/22/2016 0750   PLT 318 03/28/2022 1508   PLT 270 02/03/2020 0837   PLT 332 12/04/2018 0914   MCV 94.2 03/28/2022 1508   MCV 88 12/04/2018 0914   MCV 92.2 04/22/2016 0750   MCH 30.2 03/28/2022 1508   MCHC 32.1 03/28/2022 1508   RDW 13.6 03/28/2022 1508   RDW 13.6 12/04/2018 0914   RDW 13.9 04/22/2016 0750   Iron Studies    Component Value Date/Time   IRON 43 08/18/2010 0515   TIBC 389 08/18/2010 0515   FERRITIN 227 08/18/2010 0515   IRONPCTSAT 11 (L) 08/18/2010 0515   Lipid Panel     Component Value Date/Time   CHOL 161 07/23/2023 0925   TRIG 162 (H) 07/23/2023 0925   HDL 47 07/23/2023 0925   CHOLHDL 3.4 07/23/2023 0925   CHOLHDL 5.1 (H) 08/14/2016 1729   VLDL 74 (H) 08/14/2016 1729   LDLCALC 86 07/23/2023 0925   Hepatic Function Panel     Component Value Date/Time   PROT 7.9 10/21/2023 0926   PROT 7.3 07/23/2023 0925   PROT 8.2 04/22/2016 0750   ALBUMIN 4.6 07/23/2023 0925    ALBUMIN 3.8 04/22/2016 0750   AST 26 10/21/2023 0926  AST 29 12/29/2019 1015   AST 27 04/22/2016 0750   ALT 24 10/21/2023 0926   ALT 33 12/29/2019 1015   ALT 24 04/22/2016 0750   ALKPHOS 82 07/23/2023 0925   ALKPHOS 74 04/22/2016 0750   BILITOT 0.6 10/21/2023 0926   BILITOT 0.5 07/23/2023 0925   BILITOT 0.6 12/29/2019 1015   BILITOT 0.44 04/22/2016 0750   BILIDIR 0.2 08/18/2010 0515   IBILI 1.5 (H) 08/18/2010 0515      Component Value Date/Time   TSH 1.16 10/21/2023 0926   Nutritional Lab Results  Component Value Date   VD25OH 38 10/21/2023   VD25OH 33.96 04/23/2023   VD25OH 29.26 (L) 10/21/2022    Attestations:   LILLETTE Sonny Laroche, acting as a medical scribe for Sarah Jenkins, DO., have compiled all relevant documentation for today's office visit on behalf of Sarah Jenkins, DO, while in the presence of [employer], DO.    I have reviewed the above documentation for accuracy and completeness, and I agree with the above. Sarah JINNY [name], D.O.  The 21st Century Cures Act was signed into law in 2016 which includes the topic of electronic health records.  This provides immediate access to information in MyChart.  This includes consultation notes, operative notes, office notes, lab results and pathology reports.  If you have any questions about what you read please let us  know at your next visit so we can discuss your concerns and take corrective action if need be.  We are right here with you.

## 2024-03-09 ENCOUNTER — Encounter (INDEPENDENT_AMBULATORY_CARE_PROVIDER_SITE_OTHER): Payer: Self-pay | Admitting: Family Medicine

## 2024-03-09 ENCOUNTER — Ambulatory Visit (INDEPENDENT_AMBULATORY_CARE_PROVIDER_SITE_OTHER): Admitting: Family Medicine

## 2024-03-09 VITALS — BP 122/82 | HR 71 | Temp 97.9°F | Ht 65.0 in | Wt 174.0 lb

## 2024-03-09 DIAGNOSIS — E119 Type 2 diabetes mellitus without complications: Secondary | ICD-10-CM

## 2024-03-09 DIAGNOSIS — E559 Vitamin D deficiency, unspecified: Secondary | ICD-10-CM

## 2024-03-09 DIAGNOSIS — E669 Obesity, unspecified: Secondary | ICD-10-CM

## 2024-03-09 DIAGNOSIS — Z7984 Long term (current) use of oral hypoglycemic drugs: Secondary | ICD-10-CM

## 2024-03-09 DIAGNOSIS — E1169 Type 2 diabetes mellitus with other specified complication: Secondary | ICD-10-CM

## 2024-03-09 DIAGNOSIS — F39 Unspecified mood [affective] disorder: Secondary | ICD-10-CM

## 2024-03-09 DIAGNOSIS — F5089 Other specified eating disorder: Secondary | ICD-10-CM | POA: Diagnosis not present

## 2024-03-09 DIAGNOSIS — E66811 Obesity, class 1: Secondary | ICD-10-CM

## 2024-03-09 DIAGNOSIS — Z6828 Body mass index (BMI) 28.0-28.9, adult: Secondary | ICD-10-CM

## 2024-03-09 DIAGNOSIS — Z7985 Long-term (current) use of injectable non-insulin antidiabetic drugs: Secondary | ICD-10-CM

## 2024-03-09 NOTE — Progress Notes (Signed)
 [name], D.O.  ABFM, ABOM Specializing in Clinical Bariatric Medicine  Office located at: [address] [place], [place]  [address]      A) FOR THE CHRONIC DISEASE OF OBESITY:  Chief complaint: Obesity Sarah [name] is here to discuss her progress with her obesity treatment plan.   History of present illness / Interval history:  Sarah [name] is here today for her follow-up office visit.  Since last OV on 02/24/24, pt is down 2 lbs. Pt states that she ate several meals throughout the day and she could tell that it helped. She reports that her clothes is fitting more loose. She is struggling with getting all her protein in.      02/24/24 14:00 03/09/24 07:00   Body Fat % 36.7 % 36.9 %  Muscle Mass (lbs) 105.8 lbs 104.4 lbs  Fat Mass (lbs) 64.6 lbs 64.4 lbs  Total Body Water  (lbs) 68.8 lbs 66.8 lbs    Counseling done on how various foods will affect these numbers and how to maximize success.  Pt went down in muscle Want to get body fat % under 30 which is goal but first goal is to get it under 35%    Total lbs lost to date: - 54 lbs Total Fat Mass in lbs lost to date: - 33.2 lbs Total weight loss percentage to date: - 23.68 %    Obesity with starting BMI 37.9/date 02/15/2020 Obesity (BMI 30.0-34.9) current BMI 28.96  Nutrition Therapy She is journaling 1300-1400 cal and 85+g protein with the Category 2 MP as a guide and states she is following her eating plan approximately 90 % of the time.   - Tracking Calories/Macros: yes  - Eating More Whole Foods: yes  - Adequate Protein Intake: yes  - Adequate Water  Intake: yes  - Skipping Meals: no   - Sleeping 7-9 Hours/ Night: no    Sarah [name] is currently in the action stage of change. As such, her goal is to continue weight management plan.  She has agreed to:  journaling 1200-1300 cal and 85++ g protein with the Category 2 MP as a guide.   Physical Activity Pt is doing silver sneakers and some walking 50  minutes 3 days per week   Sarah [name] has been advised to work up to 300-450 minutes of moderate intensity aerobic activity a week and strengthening exercises 2-3 times per week for cardiovascular health, weight loss maintenance and preservation of muscle mass.  She has agreed to : Think about enjoyable ways to increase daily physical activity and overcoming barriers to exercise, Increase physical activity in their day and reduce sedentary time (increase NEAT)., Continue to gradually increase the amount and intensity of exercise routine, and Combine aerobic and strengthening exercises for efficiency and improved cardiometabolic health.   Behavioral Modifications Evidence-based interventions for health behavior change were utilized today including the discussion of  1) self monitoring techniques:   - Eat more protein  - Make protein intake a priority  2) problem-solving barriers:  - Eating multiple meals throughout the day   - Inquire about senior centers in [place]   3) self care:   4) SMART goals for next OV:   - Journal intake and bring journaling log next time  10:1 ratio  Regarding patient's less desirable eating habits and patterns, we employed the technique of small changes.   We discussed the following today: increasing lean protein intake to established goals and focusing on food with a 10:1 ratio  of calories: grams of protein, Vegetables with higher protein,resistance band exercise    Additional resources provided today: Handout on Daily Food Journaling Log, Handout on higher protein vegetables, Handout on Protein Content on Common Food   Medical Interventions/ Pharmacotherapy Previous Bariatric surgery: n/a Pharmacotherapy for weight loss: She is currently taking Wellbutrin  SR 200 mg BID, Metformin  500 mg daily and Mounjaro  15 mg weekly,  for medical weight loss.    We discussed various medication options to help Sarah [name] with her weight loss efforts and we both agreed to :  Continue with current nutritional and behavioral strategies and weight loss medication   B) OBESITY RELATED CONDITIONS ADDRESSED TODAY:  Type 2 diabetes mellitus with obesity Assessment & Plan Lab Results  Component Value Date   HGBA1C 5.6 01/22/2024   HGBA1C 6.0 (H) 07/23/2023   HGBA1C 6.4 04/23/2023    On Metformin  500 mg daily and 15 mg Mounjaro  with good adherence and tolerance.Reviewed labs obtained by PCP. A1c on 01/22/24 has decreased from a 6 to a 5.6. Pt states that she has good control over hunger and cravings. Pt checks her blood sugar at home and states that the last 90 days the average has been under 91. No acute concerns today. Continue medications. Eat on a regular basis- no skipping or going long periods without eating.    Depressed mood (HCC) with emotional eating Assessment & Plan Wellbutrin  SR 200 mg two times daily. Pt states that she is doing well and has good control on cravings. Continue regimen.     Vitamin D  deficiency Assessment & Plan Lab Results  Component Value Date   VD25OH 38 10/21/2023   VD25OH 33.96 04/23/2023   VD25OH 29.26 (L) 10/21/2022   Taking 1000 units of Vit D3 daily. Good compliance and tolerance. No acute concerns today. Will obtain labs in the future. Continue supplementation.      Follow up:   Return 03/23/2024 at 11:20 AM  She was informed of the importance of frequent follow up visits to maximize her success with intensive lifestyle modifications for her multiple health conditions.   Weight Summary and Biometrics   Weight Lost Since Last Visit: 2lb  Weight Gained Since Last Visit: 0lb    Vitals Temp: 97.9 F (36.6 C) BP: 122/82 Pulse Rate: 71 SpO2: 97 %   Anthropometric Measurements Height: 5' 5 (1.651 m) Weight: 174 lb (78.9 kg) BMI (Calculated): 28.96 Weight at Last Visit: 176lb Weight Lost Since Last Visit: 2lb Weight Gained Since Last Visit: 0lb Starting Weight: 228lb Total Weight Loss (lbs): 54 lb (24.5  kg) Peak Weight: 228lb   Body Composition  Body Fat %: 36.9 % Fat Mass (lbs): 64.4 lbs Muscle Mass (lbs): 104.4 lbs Total Body Water  (lbs): 66.8 lbs Visceral Fat Rating : 9   Other Clinical Data Fasting: yes Labs: no Today's Visit #: 90 Starting Date: 02/15/20    Objective:   PHYSICAL EXAM: Blood pressure 122/82, pulse 71, temperature 97.9 F (36.6 C), height 5' 5 (1.651 m), weight 174 lb (78.9 kg), SpO2 97%. Body mass index is 28.96 kg/m.  General: she is overweight, cooperative and in no acute distress. PSYCH: Has normal mood, affect and thought process.   HEENT: EOMI, sclerae are anicteric. Lungs: Normal breathing effort, no conversational dyspnea. Extremities: Moves * 4 Neurologic: A and O * 3, good insight  DIAGNOSTIC DATA REVIEWED: BMET    Component Value Date/Time   NA 139 10/21/2023 0926   NA 143 07/23/2023 0925   NA 138  04/22/2016 0750   K 4.1 10/21/2023 0926   K 3.9 04/22/2016 0750   CL 104 10/21/2023 0926   CO2 26 10/21/2023 0926   CO2 26 04/22/2016 0750   GLUCOSE 102 (H) 10/21/2023 0926   GLUCOSE 142 (H) 04/22/2016 0750   BUN 17 10/21/2023 0926   BUN 19 07/23/2023 0925   BUN 19.1 11/20/2016 1355   CREATININE 1.10 (H) 10/21/2023 0926   CREATININE 1.1 11/20/2016 1355   CALCIUM  12.3 (H) 10/21/2023 0926   CALCIUM  9.8 04/22/2016 0750   GFRNONAA 47 (L) 03/28/2022 1508   GFRNONAA 48 (L) 12/29/2019 1015   GFRNONAA 63 07/08/2014 1212   GFRAA 69 04/17/2020 0928   GFRAA 56 (L) 12/29/2019 1015   GFRAA 73 07/08/2014 1212   Lab Results  Component Value Date   HGBA1C 5.6 01/22/2024   HGBA1C 10.1 (H) 07/07/2015   No results found for: INSULIN  Lab Results  Component Value Date   TSH 1.16 10/21/2023   CBC    Component Value Date/Time   WBC 5.4 03/28/2022 1508   RBC 4.97 03/28/2022 1508   HGB 15.0 03/28/2022 1508   HGB 13.3 02/03/2020 0837   HGB 13.6 12/04/2018 0914   HGB 12.9 04/22/2016 0750   HCT 46.8 (H) 03/28/2022 1508   HCT 41.0  12/04/2018 0914   HCT 39.0 04/22/2016 0750   PLT 318 03/28/2022 1508   PLT 270 02/03/2020 0837   PLT 332 12/04/2018 0914   MCV 94.2 03/28/2022 1508   MCV 88 12/04/2018 0914   MCV 92.2 04/22/2016 0750   MCH 30.2 03/28/2022 1508   MCHC 32.1 03/28/2022 1508   RDW 13.6 03/28/2022 1508   RDW 13.6 12/04/2018 0914   RDW 13.9 04/22/2016 0750   Iron Studies    Component Value Date/Time   IRON 43 08/18/2010 0515   TIBC 389 08/18/2010 0515   FERRITIN 227 08/18/2010 0515   IRONPCTSAT 11 (L) 08/18/2010 0515   Lipid Panel     Component Value Date/Time   CHOL 161 07/23/2023 0925   TRIG 162 (H) 07/23/2023 0925   HDL 47 07/23/2023 0925   CHOLHDL 3.4 07/23/2023 0925   CHOLHDL 5.1 (H) 08/14/2016 1729   VLDL 74 (H) 08/14/2016 1729   LDLCALC 86 07/23/2023 0925   Hepatic Function Panel     Component Value Date/Time   PROT 7.9 10/21/2023 0926   PROT 7.3 07/23/2023 0925   PROT 8.2 04/22/2016 0750   ALBUMIN 4.6 07/23/2023 0925   ALBUMIN 3.8 04/22/2016 0750   AST 26 10/21/2023 0926   AST 29 12/29/2019 1015   AST 27 04/22/2016 0750   ALT 24 10/21/2023 0926   ALT 33 12/29/2019 1015   ALT 24 04/22/2016 0750   ALKPHOS 82 07/23/2023 0925   ALKPHOS 74 04/22/2016 0750   BILITOT 0.6 10/21/2023 0926   BILITOT 0.5 07/23/2023 0925   BILITOT 0.6 12/29/2019 1015   BILITOT 0.44 04/22/2016 0750   BILIDIR 0.2 08/18/2010 0515   IBILI 1.5 (H) 08/18/2010 0515      Component Value Date/Time   TSH 1.16 10/21/2023 0926   Nutritional Lab Results  Component Value Date   VD25OH 38 10/21/2023   VD25OH 33.96 04/23/2023   VD25OH 29.26 (L) 10/21/2022    Attestations:   LILLETTE Sonny Laroche, acting as a Stage manager for Barnie Jenkins, DO., have compiled all relevant documentation for today's office visit on behalf of Barnie Jenkins, DO, while in the presence of [employer], DO.    I have  reviewed the above documentation for accuracy and completeness, and I agree with the above. Barnie JINNY Jenkins, D.O.  The 21st Century Cures Act was signed into law in 2016 which includes the topic of electronic health records.  This provides immediate access to information in MyChart.  This includes consultation notes, operative notes, office notes, lab results and pathology reports.  If you have any questions about what you read please let us  know at your next visit so we can discuss your concerns and take corrective action if need be.  We are right here with you.

## 2024-03-16 ENCOUNTER — Encounter: Payer: Self-pay | Admitting: Internal Medicine

## 2024-03-16 ENCOUNTER — Other Ambulatory Visit: Payer: Self-pay | Admitting: Internal Medicine

## 2024-03-16 DIAGNOSIS — K219 Gastro-esophageal reflux disease without esophagitis: Secondary | ICD-10-CM

## 2024-03-16 MED ORDER — OMEPRAZOLE 20 MG PO CPDR
20.0000 mg | DELAYED_RELEASE_CAPSULE | Freq: Every day | ORAL | 1 refills | Status: AC
Start: 2024-03-16 — End: ?

## 2024-03-20 ENCOUNTER — Other Ambulatory Visit (INDEPENDENT_AMBULATORY_CARE_PROVIDER_SITE_OTHER): Payer: Self-pay | Admitting: Family Medicine

## 2024-03-20 DIAGNOSIS — F39 Unspecified mood [affective] disorder: Secondary | ICD-10-CM

## 2024-03-23 ENCOUNTER — Ambulatory Visit (INDEPENDENT_AMBULATORY_CARE_PROVIDER_SITE_OTHER): Admitting: Family Medicine

## 2024-04-06 ENCOUNTER — Other Ambulatory Visit: Payer: Self-pay

## 2024-04-06 ENCOUNTER — Encounter (INDEPENDENT_AMBULATORY_CARE_PROVIDER_SITE_OTHER): Payer: Self-pay | Admitting: Family Medicine

## 2024-04-06 ENCOUNTER — Other Ambulatory Visit (HOSPITAL_COMMUNITY): Payer: Self-pay

## 2024-04-06 ENCOUNTER — Ambulatory Visit (INDEPENDENT_AMBULATORY_CARE_PROVIDER_SITE_OTHER): Admitting: Family Medicine

## 2024-04-06 VITALS — BP 129/84 | HR 75 | Temp 97.7°F | Ht 64.0 in | Wt 171.0 lb

## 2024-04-06 DIAGNOSIS — Z6828 Body mass index (BMI) 28.0-28.9, adult: Secondary | ICD-10-CM

## 2024-04-06 DIAGNOSIS — E782 Mixed hyperlipidemia: Secondary | ICD-10-CM | POA: Diagnosis not present

## 2024-04-06 DIAGNOSIS — Z7984 Long term (current) use of oral hypoglycemic drugs: Secondary | ICD-10-CM

## 2024-04-06 DIAGNOSIS — F5089 Other specified eating disorder: Secondary | ICD-10-CM

## 2024-04-06 DIAGNOSIS — T383X5A Adverse effect of insulin and oral hypoglycemic [antidiabetic] drugs, initial encounter: Secondary | ICD-10-CM | POA: Diagnosis not present

## 2024-04-06 DIAGNOSIS — T887XXA Unspecified adverse effect of drug or medicament, initial encounter: Secondary | ICD-10-CM

## 2024-04-06 DIAGNOSIS — E66811 Obesity, class 1: Secondary | ICD-10-CM

## 2024-04-06 DIAGNOSIS — R110 Nausea: Secondary | ICD-10-CM | POA: Diagnosis not present

## 2024-04-06 DIAGNOSIS — F39 Unspecified mood [affective] disorder: Secondary | ICD-10-CM | POA: Diagnosis not present

## 2024-04-06 DIAGNOSIS — E1169 Type 2 diabetes mellitus with other specified complication: Secondary | ICD-10-CM | POA: Diagnosis not present

## 2024-04-06 DIAGNOSIS — E669 Obesity, unspecified: Secondary | ICD-10-CM | POA: Diagnosis not present

## 2024-04-06 DIAGNOSIS — Z7985 Long-term (current) use of injectable non-insulin antidiabetic drugs: Secondary | ICD-10-CM

## 2024-04-06 DIAGNOSIS — E559 Vitamin D deficiency, unspecified: Secondary | ICD-10-CM | POA: Diagnosis not present

## 2024-04-06 MED ORDER — TIRZEPATIDE 12.5 MG/0.5ML ~~LOC~~ SOAJ
12.5000 mg | SUBCUTANEOUS | 0 refills | Status: DC
  Filled 2024-04-06: qty 2, 28d supply, fill #0

## 2024-04-06 MED ORDER — ONDANSETRON HCL 4 MG PO TABS
ORAL_TABLET | ORAL | 0 refills | Status: AC
  Filled 2024-04-06: qty 30, 7d supply, fill #0

## 2024-04-06 NOTE — Progress Notes (Signed)
 [name], D.O.  ABFM, ABOM Specializing in Clinical Bariatric Medicine  Office located at: [address] [place], [place]  [address]      A) FOR THE CHRONIC DISEASE OF OBESITY:  Chief complaint: Obesity Sarah [name] is here to discuss her progress with her obesity treatment plan.   History of present illness / Interval history:  Sarah [name] is here today for her follow-up office visit.  Since last OV on 03/09/24, pt is down 3 lbs. Pt states that she had some car trouble and has been a little down about that. She has been eating more protein than usual and is skipping less meals. She is averaging about 30 g of protein per day and is aware of when she is skipping meals due to her tracking.    03/09/24 07:00 04/06/24 10:00   Body Fat % 36.9 % 36.3 %  Muscle Mass (lbs) 104.4 lbs 103.6 lbs  Fat Mass (lbs) 64.4 lbs 62.2 lbs  Total Body Water  (lbs) 66.8 lbs 65.4 lbs  Visceral Fat Rating  9 9   Counseling done on how various foods will affect these numbers and how to maximize success   Total lbs lost to date: -57 lbs Total Fat Mass in lbs lost to date: - 35.4 lbs Total weight loss percentage to date: - 25.00 %    Obesity with starting BMI 37.9/date 02/15/2020 Obesity (BMI 30.0-34.9) current BMI 28.46 Nutrition Therapy She is journaling 1200-1300 cal and 85++ g protein with the Category 2 MP as a guide and states she is following her eating plan.  - Tracking Calories/Macros: yes  - Eating More Whole Foods: yes  - Adequate Protein Intake: no - being more purposeful about protein  - Adequate Water  Intake: yes  - Skipping Meals: yes  - Sleeping 7-9 Hours/ Night: no    Sarah [name] is currently in the action stage of change. As such, her goal is to continue weight management plan.  She has agreed to: continue current plan   Physical Activity Sarah [name] is doing silver sneakers 50 minutes 3 days per week and  walking 30 minutes 2 days per week.    Sarah [name] has been  advised to work up to 300-450 minutes of moderate intensity aerobic activity a week and strengthening exercises 2-3 times per week for cardiovascular health, weight loss maintenance and preservation of muscle mass.  She has agreed to : Increase physical activity in their day and reduce sedentary time (increase NEAT)., Increase volume of physical activity to a goal of 240 minutes a week, and Combine aerobic and strengthening exercises for efficiency and improved cardiometabolic health.   Behavioral Modifications Evidence-based interventions for health behavior change were utilized today including the discussion of  1) self monitoring techniques:    - Journal  - Think about protein at every meal  2) problem-solving barriers:    - Eat multiple smaller meals throughout the day  3) SMART goals for next OV:    - Eat on plan 50% of the time.   - Cut out caloric beverages.  Regarding patient's less desirable eating habits and patterns, we employed the technique of small changes.   We discussed the following today: increasing lean protein intake to established goals, avoiding skipping meals, work on tracking and journaling calories using tracking application, keeping healthy foods at home, and planning for success Additional resources provided today: Handout on Daily Food Journaling Log   Medical Interventions/ Pharmacotherapy Previous Bariatric surgery: n/a Pharmacotherapy for weight  loss: She is currently taking Wellbutrin  SR 200 mg BID, Metformin  500 mg daily and Mounjaro  15 mg weekly,  for medical weight loss.      Medication side effect Assessment & Plan Pt states that she is having severe nausea after taking her Mounjaro  15 mg. She states that she takes it on a Wednesday since Thursday is her rest day. Due to nausea caused by Mounjaro  mutually agreed to decrease Mounjaro  to 12.5 mg weekly. Explained to pt that this could also be due to decreased calorie intake and her not eating enough.  Will refill Zofran  today. Continue eating on plan and will reassess at next OV.      We discussed various medication options to help Sarah [name] with her weight loss efforts and we both agreed to : Continue with current nutritional and behavioral strategies and Adequate clinical response to anti-obesity medication, continue current regimen   B) OBESITY RELATED CONDITIONS ADDRESSED TODAY:  Depressed mood (HCC) with emotional eating Assessment & Plan On Wellbutrin  SR 200 mg two times daily. Good compliance and tolerance. Pt denies any SI/HI. She endorses that the Wellbutrin  has helped stabilize her mood tremendously and that she cannot go a day without it. Continue with medication and building strategies to help with her mood.     Vitamin D  deficiency Assessment & Plan Lab Results  Component Value Date   VD25OH 38 10/21/2023   VD25OH 33.96 04/23/2023   VD25OH 29.26 (L) 10/21/2022   Taking OTC 1000 units of Vit D3 daily. Reported good compliance and tolerance. No acute concerns today. Continue with supplementation. Will obtain labs in the near future.     Mixed diabetic hyperlipidemia associated with type 2 diabetes mellitus Sarah [name]) Assessment & Plan Lab Results  Component Value Date   CHOL 161 07/23/2023   HDL 47 07/23/2023   LDLCALC 86 07/23/2023   TRIG 162 (H) 07/23/2023   CHOLHDL 3.4 07/23/2023    Pts lipid panel is not at goal - poorly controlled as a diabetic. Goal for LDL would be less than 70. Trig levels increase as pt increases carbs and not enough protein. On Lovaza  2 g twice daily. Good compliance and tolerance.     Type 2 diabetes mellitus in patient with obesity [facility]) Assessment & Plan Lab Results  Component Value Date   HGBA1C 5.6 01/22/2024   HGBA1C 6.0 (H) 07/23/2023   HGBA1C 6.4 04/23/2023    Metformin  500 mg daily and 15 mg Mounjaro . Good compliance and tolerance. Pts lowest protein intake was 12 g and the highest intake being 60 g. She is aware that she  skips meals and is trying to do better. Extensive discussion of how she she eat and increase eating more consistently and most importantly eating her lean proteins. Pts sugars levels are well controlled averaging about 80 in the last couple of weeks. Last A1C was 5.6 and showed that it was well controlled. Continue following prudent meal plan focusing on lean protein and decreasing simple carbs and sugars.      Medications Discontinued During This Encounter  Medication Reason   tirzepatide  (MOUNJARO ) 15 MG/0.5ML Pen Dose change   ondansetron  (ZOFRAN ) 4 MG tablet Reorder     Meds ordered this encounter  Medications   tirzepatide  (MOUNJARO ) 12.5 MG/0.5ML Pen    Sig: Inject 12.5 mg into the skin once a week.    Dispense:  2 mL    Refill:  0   ondansetron  (ZOFRAN ) 4 MG tablet    Sig: Take 1  tablet by mouth every 6 hours as needed severe nausea    Dispense:  30 tablet    Refill:  0      Follow up:   Return 04/27/2024 at 10:40 AM  She was informed of the importance of frequent follow up visits to maximize her success with intensive lifestyle modifications for her multiple health conditions.   Weight Summary and Biometrics   Weight Lost Since Last Visit: 3lb  Weight Gained Since Last Visit: 0    Vitals Temp: 97.7 F (36.5 C) BP: 129/84 Pulse Rate: 75 SpO2: 99 %   Anthropometric Measurements Height: 5' 5 (1.651 m) Weight: 171 lb (77.6 kg) BMI (Calculated): 28.46 Weight at Last Visit: 174lb Weight Lost Since Last Visit: 3lb Weight Gained Since Last Visit: 0 Starting Weight: 228lb Total Weight Loss (lbs): 57 lb (25.9 kg) Peak Weight: 228lb   Body Composition  Body Fat %: 36.3 % Fat Mass (lbs): 62.2 lbs Muscle Mass (lbs): 103.6 lbs Total Body Water  (lbs): 65.4 lbs Visceral Fat Rating : 9   Other Clinical Data Fasting: yes Labs: no Today's Visit #: 91 Starting Date: 02/15/20    Objective:   PHYSICAL EXAM: Blood pressure 129/84, pulse 75, temperature 97.7  F (36.5 C), height 5' 5 (1.651 m), weight 171 lb (77.6 kg), SpO2 99%. Body mass index is 28.46 kg/m.  General: she is overweight, cooperative and in no acute distress. PSYCH: Has normal mood, affect and thought process.   HEENT: EOMI, sclerae are anicteric. Lungs: Normal breathing effort, no conversational dyspnea. Extremities: Moves * 4 Neurologic: A and O * 3, good insight  DIAGNOSTIC DATA REVIEWED: BMET    Component Value Date/Time   NA 139 10/21/2023 0926   NA 143 07/23/2023 0925   NA 138 04/22/2016 0750   K 4.1 10/21/2023 0926   K 3.9 04/22/2016 0750   CL 104 10/21/2023 0926   CO2 26 10/21/2023 0926   CO2 26 04/22/2016 0750   GLUCOSE 102 (H) 10/21/2023 0926   GLUCOSE 142 (H) 04/22/2016 0750   BUN 17 10/21/2023 0926   BUN 19 07/23/2023 0925   BUN 19.1 11/20/2016 1355   CREATININE 1.10 (H) 10/21/2023 0926   CREATININE 1.1 11/20/2016 1355   CALCIUM  12.3 (H) 10/21/2023 0926   CALCIUM  9.8 04/22/2016 0750   GFRNONAA 47 (L) 03/28/2022 1508   GFRNONAA 48 (L) 12/29/2019 1015   GFRNONAA 63 07/08/2014 1212   GFRAA 69 04/17/2020 0928   GFRAA 56 (L) 12/29/2019 1015   GFRAA 73 07/08/2014 1212   Lab Results  Component Value Date   HGBA1C 5.6 01/22/2024   HGBA1C 10.1 (H) 07/07/2015   No results found for: INSULIN  Lab Results  Component Value Date   TSH 1.16 10/21/2023   CBC    Component Value Date/Time   WBC 5.4 03/28/2022 1508   RBC 4.97 03/28/2022 1508   HGB 15.0 03/28/2022 1508   HGB 13.3 02/03/2020 0837   HGB 13.6 12/04/2018 0914   HGB 12.9 04/22/2016 0750   HCT 46.8 (H) 03/28/2022 1508   HCT 41.0 12/04/2018 0914   HCT 39.0 04/22/2016 0750   PLT 318 03/28/2022 1508   PLT 270 02/03/2020 0837   PLT 332 12/04/2018 0914   MCV 94.2 03/28/2022 1508   MCV 88 12/04/2018 0914   MCV 92.2 04/22/2016 0750   MCH 30.2 03/28/2022 1508   MCHC 32.1 03/28/2022 1508   RDW 13.6 03/28/2022 1508   RDW 13.6 12/04/2018 0914   RDW 13.9 04/22/2016 0750  Iron Studies     Component Value Date/Time   IRON 43 08/18/2010 0515   TIBC 389 08/18/2010 0515   FERRITIN 227 08/18/2010 0515   IRONPCTSAT 11 (L) 08/18/2010 0515   Lipid Panel     Component Value Date/Time   CHOL 161 07/23/2023 0925   TRIG 162 (H) 07/23/2023 0925   HDL 47 07/23/2023 0925   CHOLHDL 3.4 07/23/2023 0925   CHOLHDL 5.1 (H) 08/14/2016 1729   VLDL 74 (H) 08/14/2016 1729   LDLCALC 86 07/23/2023 0925   Hepatic Function Panel     Component Value Date/Time   PROT 7.9 10/21/2023 0926   PROT 7.3 07/23/2023 0925   PROT 8.2 04/22/2016 0750   ALBUMIN 4.6 07/23/2023 0925   ALBUMIN 3.8 04/22/2016 0750   AST 26 10/21/2023 0926   AST 29 12/29/2019 1015   AST 27 04/22/2016 0750   ALT 24 10/21/2023 0926   ALT 33 12/29/2019 1015   ALT 24 04/22/2016 0750   ALKPHOS 82 07/23/2023 0925   ALKPHOS 74 04/22/2016 0750   BILITOT 0.6 10/21/2023 0926   BILITOT 0.5 07/23/2023 0925   BILITOT 0.6 12/29/2019 1015   BILITOT 0.44 04/22/2016 0750   BILIDIR 0.2 08/18/2010 0515   IBILI 1.5 (H) 08/18/2010 0515      Component Value Date/Time   TSH 1.16 10/21/2023 0926   Nutritional Lab Results  Component Value Date   VD25OH 38 10/21/2023   VD25OH 33.96 04/23/2023   VD25OH 29.26 (L) 10/21/2022    Attestations:   LILLETTE Sonny Laroche, acting as a stage manager for Barnie Jenkins, DO., have compiled all relevant documentation for today's office visit on behalf of Barnie Jenkins, DO, while in the presence of [employer], DO.  I have spent 40 minutes in the care of the patient today including 29 minutes face-to-face assessing and reviewing listed medical problems above as outlined in office visit note and providing nutritional and behavioral counseling as outlined in obesity care plan.   I have reviewed the above documentation for accuracy and completeness, and I agree with the above. Barnie JINNY Jenkins, D.O.  The 21st Century Cures Act was signed into law in 2016 which includes the topic of  electronic health records.  This provides immediate access to information in MyChart.  This includes consultation notes, operative notes, office notes, lab results and pathology reports.  If you have any questions about what you read please let us  know at your next visit so we can discuss your concerns and take corrective action if need be.  We are right here with you.

## 2024-04-09 DIAGNOSIS — E1165 Type 2 diabetes mellitus with hyperglycemia: Secondary | ICD-10-CM | POA: Diagnosis not present

## 2024-04-09 DIAGNOSIS — Z8543 Personal history of malignant neoplasm of ovary: Secondary | ICD-10-CM | POA: Diagnosis not present

## 2024-04-09 DIAGNOSIS — E1122 Type 2 diabetes mellitus with diabetic chronic kidney disease: Secondary | ICD-10-CM | POA: Diagnosis not present

## 2024-04-09 DIAGNOSIS — M1990 Unspecified osteoarthritis, unspecified site: Secondary | ICD-10-CM | POA: Diagnosis not present

## 2024-04-09 DIAGNOSIS — H409 Unspecified glaucoma: Secondary | ICD-10-CM | POA: Diagnosis not present

## 2024-04-09 DIAGNOSIS — J45909 Unspecified asthma, uncomplicated: Secondary | ICD-10-CM | POA: Diagnosis not present

## 2024-04-09 DIAGNOSIS — E669 Obesity, unspecified: Secondary | ICD-10-CM | POA: Diagnosis not present

## 2024-04-09 DIAGNOSIS — I700 Atherosclerosis of aorta: Secondary | ICD-10-CM | POA: Diagnosis not present

## 2024-04-09 DIAGNOSIS — E1136 Type 2 diabetes mellitus with diabetic cataract: Secondary | ICD-10-CM | POA: Diagnosis not present

## 2024-04-09 DIAGNOSIS — E890 Postprocedural hypothyroidism: Secondary | ICD-10-CM | POA: Diagnosis not present

## 2024-04-09 DIAGNOSIS — Z79899 Other long term (current) drug therapy: Secondary | ICD-10-CM | POA: Diagnosis not present

## 2024-04-09 DIAGNOSIS — I129 Hypertensive chronic kidney disease with stage 1 through stage 4 chronic kidney disease, or unspecified chronic kidney disease: Secondary | ICD-10-CM | POA: Diagnosis not present

## 2024-04-09 DIAGNOSIS — G620 Drug-induced polyneuropathy: Secondary | ICD-10-CM | POA: Diagnosis not present

## 2024-04-09 DIAGNOSIS — Z8249 Family history of ischemic heart disease and other diseases of the circulatory system: Secondary | ICD-10-CM | POA: Diagnosis not present

## 2024-04-09 DIAGNOSIS — N189 Chronic kidney disease, unspecified: Secondary | ICD-10-CM | POA: Diagnosis not present

## 2024-04-09 DIAGNOSIS — N3941 Urge incontinence: Secondary | ICD-10-CM | POA: Diagnosis not present

## 2024-04-09 DIAGNOSIS — E049 Nontoxic goiter, unspecified: Secondary | ICD-10-CM | POA: Diagnosis not present

## 2024-04-09 DIAGNOSIS — M4800 Spinal stenosis, site unspecified: Secondary | ICD-10-CM | POA: Diagnosis not present

## 2024-04-09 DIAGNOSIS — K219 Gastro-esophageal reflux disease without esophagitis: Secondary | ICD-10-CM | POA: Diagnosis not present

## 2024-04-09 DIAGNOSIS — M545 Low back pain, unspecified: Secondary | ICD-10-CM | POA: Diagnosis not present

## 2024-04-09 DIAGNOSIS — E785 Hyperlipidemia, unspecified: Secondary | ICD-10-CM | POA: Diagnosis not present

## 2024-04-19 ENCOUNTER — Other Ambulatory Visit: Payer: Self-pay | Admitting: Internal Medicine

## 2024-04-19 DIAGNOSIS — G8929 Other chronic pain: Secondary | ICD-10-CM

## 2024-04-19 DIAGNOSIS — M545 Low back pain, unspecified: Secondary | ICD-10-CM

## 2024-04-20 ENCOUNTER — Encounter: Payer: Self-pay | Admitting: Internal Medicine

## 2024-04-20 ENCOUNTER — Ambulatory Visit (INDEPENDENT_AMBULATORY_CARE_PROVIDER_SITE_OTHER): Admitting: Internal Medicine

## 2024-04-20 ENCOUNTER — Other Ambulatory Visit

## 2024-04-20 DIAGNOSIS — E042 Nontoxic multinodular goiter: Secondary | ICD-10-CM | POA: Diagnosis not present

## 2024-04-20 NOTE — Progress Notes (Addendum)
 Patient ID: Sarah [name], female   DOB: 13-Mar-1966, 58 y.o.   MRN: [id number]  HPI  Sarah [name] is a 58 y.o.-year-old pleasant  female, returning for follow-up for hypercalcemia/hyperparathyroidism, also thyroid  nodules and controlled DM2.  She previously saw Dr. Kassie, but last visit with me was 6 months ago.  Interim history: She continues to be followed in Weight management clinic. On Mounjaro  12.5 mg weekly.  She exercises consistently.  Since last visit she lost approximately 30 pounds! She has constipation, on MiraLAX .  Hypercalcemia:  Pt was dx with hypercalcemia in 2020. I reviewed pt's pertinent labs: Lab Results  Component Value Date   PTH 41 10/21/2023   PTH CANCELED 04/23/2023   PTH CANCELED 04/23/2023   PTH 29 10/21/2022   PTH 16 06/21/2022   PTH 26 07/26/2020   PTH 26 04/29/2019   PTH Comment 04/29/2019   PTH 21 03/22/2019   PTH Comment 03/22/2019   Lab Results  Component Value Date   CAION 5.9 (H) 04/23/2023   CAION 6.0 (H) 10/21/2022   CAION 5.9 (H) 06/21/2022   10/21/2023: Corrected calcium  is 11.7, elevated.   Lab Results  Component Value Date   CALCIUM  12.3 (H) 10/21/2023   CALCIUM  11.4 (H) 07/23/2023   CALCIUM  11.7 (H) 05/22/2023   CALCIUM  12.2 (H) 06/21/2022   CALCIUM  11.3 (H) 03/28/2022   CALCIUM  12.1 (H) 01/09/2022   CALCIUM  10.5 (H) 07/04/2021   CALCIUM  11.3 (H) 06/26/2021   CALCIUM  12.1 (H) 05/28/2021   CALCIUM  12.3 (H) 03/07/2021   CALCIUM  11.5 (H) 11/29/2020   CALCIUM  11.7 (H) 07/26/2020   CALCIUM  11.9 (H) 04/17/2020   CALCIUM  10.7 (H) 01/11/2020   CALCIUM  11.7 (H) 12/29/2019   CALCIUM  10.9 (H) 10/06/2019   CALCIUM  10.6 (H) 09/29/2019   CALCIUM  12.0 (H) 09/23/2019   CALCIUM  10.5 (H) 06/16/2019   CALCIUM  11.1 (H) 04/29/2019   CALCIUM  10.9 (H) 03/22/2019   CALCIUM  10.6 (H) 12/04/2018   CALCIUM  10.4 (H) 11/23/2018   CALCIUM  10.0 08/28/2018   CALCIUM  9.7 11/17/2017   CALCIUM  9.8 04/22/2016   CALCIUM  10.1 01/22/2016   CALCIUM   10.3 10/02/2015   CALCIUM  8.9 07/10/2015   CALCIUM  9.2 07/09/2015   CALCIUM  9.3 07/08/2015   CALCIUM  9.4 07/07/2015   CALCIUM  9.5 07/03/2015   CALCIUM  9.9 04/27/2015   CALCIUM  10.1 03/27/2015   CALCIUM  8.9 12/22/2014   CALCIUM  9.5 12/08/2014   CALCIUM  9.2 11/24/2014   CALCIUM  9.0 11/17/2014   CALCIUM  8.7 11/03/2014   CALCIUM  10.5 (H) 10/27/2014   CALCIUM  8.4 10/18/2014   CALCIUM  9.3 10/03/2014   CALCIUM  9.4 09/22/2014   CALCIUM  10.3 09/15/2014   CALCIUM  9.2 09/08/2014   CALCIUM  9.5 09/01/2014   CALCIUM  9.7 08/19/2014   CALCIUM  9.1 07/31/2014   CALCIUM  9.4 07/30/2014  07/23/2023: Corrected calcium  10.9 (8.7-10.2)  A 24-hour urine calcium  was normal (no creatinine associated with the reading): Component     Latest Ref Rng 08/16/2020  Calcium , 24H Urine     mg/24 h 81    A repeat 24-hour urine calcium  was low: Component     Latest Ref Rng 06/24/2022  Calcium , 24H Urine     mg/24 h 30 (L)   Creatinine, 24H Ur     0.50 - 2.15 g/24 h 1.02   The fractional excretion of calcium  is 0.0029% (<0.01%).  We repeated the 24-hour urine calcium  in 2025 and this was again low: Component     Latest Ref Rng 10/24/2023  Creatinine, 24H Ur  0.50 - 2.15 g/24 h 1.48   Calcium , 24H Urine     mg/24 h 78   FECa = 0.0049, <0.01%.  She does not take calcium  supplements.  No fractures or falls or evidence of osteoporosis.   No h/o kidney stones.  She has CKD stage III. Last BUN/Cr: Lab Results  Component Value Date   BUN 17 10/21/2023   BUN 19 07/23/2023   CREATININE 1.10 (H) 10/21/2023   CREATININE 1.35 (H) 07/23/2023   Pt was previously on HCTZ, which was stopped by Dr. Kassie.  She is not on vitamin A .  She has a h/o vitamin D  deficiency. Reviewed vit D levels: Lab Results  Component Value Date   VD25OH 38 10/21/2023   VD25OH 33.96 04/23/2023   VD25OH 29.26 (L) 10/21/2022   VD25OH 31.2 06/21/2022   VD25OH 35.7 10/22/2021   VD25OH 99.6 06/26/2021   VD25OH 81.1  03/07/2021   VD25OH 60.6 11/29/2020   VD25OH 41.54 07/26/2020   VD25OH 44.6 04/17/2020  She was not taking vitamin D   at last OV - off x 1 year (was on Ergocalciferol ). She started 1000 units daily - 10/2022.  Calcitriol, vit A, calcitonin, and PTH-rp levels were normal, while chromogranin and FGF 23 were elevated: Component     Latest Ref Rng 10/21/2023  Calcitonin     <=5 pg/mL <2    Component     Latest Ref Rng 04/23/2023  Vitamin D  1, 25 (OH) Total     18 - 72 pg/mL 39   Vitamin D3 1, 25 (OH)     pg/mL 31   Vitamin D2 1, 25 (OH)     pg/mL 8   Chromogranin A (ng/mL)     0.0 - 101.8 ng/mL 108.5 (H)   Calcitonin CANCELED   FGF-23 Fibroblast Growth Factor     <180 RU/mL 248 (H)   PTH, Intact     pg/mL CANCELED     Component     Latest Ref Rng 03/22/2019 04/29/2019 07/26/2020  Vitamin D  1, 25 (OH) Total     18 - 72 pg/mL   24   Vitamin D3 1, 25 (OH)     pg/mL   <8   Vitamin D2 1, 25 (OH)     pg/mL   24   PTH-related peptide     pmol/L <2.0  <2.0    Vitamin A  (Retinoic Acid)     38 - 98 mcg/dL   73   PTH-Related Protein (PTH-RP)     11 - 20 pg/mL   17    Calcitonin level was not Magnesium  levels were reviewed: Lab Results  Component Value Date   MG 1.6 06/21/2022   MG 1.5 (L) 01/09/2022   MG 1.6 11/29/2020   MG 1.7 07/12/2015   MG 1.9 07/11/2015   MG 2.0 07/10/2015   MG 2.2 07/09/2015   MG 2.3 07/08/2015   MG 1.9 08/19/2010   Phosphorus level was slightly low, but normalized: Lab Results  Component Value Date   PHOS 2.9 04/23/2023   PHOS 2.6 (L) 06/21/2022   Of note, patient has a history of increased free light chains - she had bone marrow biopsy for this and was seen by hematology but no further workup is needed for this and no follow-up was recommended.  We checked an SPEP >> normal: Component     Latest Ref Rng 10/21/2022  Total Protein     6.0 - 8.5 g/dL 7.7   Globulin, Total  2.2 - 3.9 g/dL 3.5   Alpha 1     0.0 - 0.4 g/dL 0.2   IgG  (Immunoglobin G), Serum     586 - 1,602 mg/dL 8,630   IgM (Immunoglobulin M), Srm     26 - 217 mg/dL 45   Albumin SerPl Elph-Mcnc     2.9 - 4.4 g/dL 4.2   Alpha2 Glob SerPl Elph-Mcnc     0.4 - 1.0 g/dL 0.7   B-Globulin SerPl Elph-Mcnc     0.7 - 1.3 g/dL 1.2   Gamma Glob SerPl Elph-Mcnc     0.4 - 1.8 g/dL 1.3   M Protein SerPl Elph-Mcnc     Not Observed g/dL Not Observed   Albumin/Glob SerPl     0.7 - 1.7  1.3   IFE 1 Comment   Please Note (HCV): Comment   IgA/Immunoglobulin A, Serum     87 - 352 mg/dL 830    Reviewed imaging study reports: PET scan (12/08/2019): COMPARISON:  [facility] PET scan 10/26/2019, CT 10/29/2019   FINDINGS: NECK No radiotracer activity in neck lymph nodes. Incidental CT findings: None   CHEST A 10 mm nodule again noted in the RIGHT lower lobe (image 79/4). Nodule has very mild radiotracer accumulation (SUV max 2.5).   Incidental finding activity in the RIGHT lobe of thyroid  gland is favored benign.   Incidental CT finding:No mediastinal adenopathy.   ABDOMEN/PELVIS   No abnormal radiotracer accumulation within liver. No foci of neuroendocrine tumor within the peritoneal space of the abdomen or pelvis. No abnormality of the bowel.   Physiologic activity noted in the liver, spleen, adrenal glands and kidneys.   Incidental CT findings:Large ventral hernia contains nonobstructed loops small bowel not changed from prior. Atherosclerotic calcification of the aorta.   Post hysterectomy.  Adnexa unremarkable   SKELETON   No focal activity to suggest skeletal metastasis.   Incidental CT findings:None   IMPRESSION: 1. Very low radiotracer activity associated with the RIGHT lobe pulmonary nodule is nonspecific. Findings would not be consistent with metastatic well-differentiated carcinoid tumor. Primary bronchial carcinoid can have low somatostatin receptor activity. Overall favor benign/indolent lesion. 2. No evidence of well differentiated  neuroendocrine tumor within the abdomen pelvis.  I was not aware about this at last visit, but patient actually had a biopsy of the right lower lobe nodule: Bronchial biopsy (01/13/2020):   Cytology:   Chest CT scan from 07/11/2020: 1.1 cm right lower lobe nodule appears to be mildly progressive, with slow growth, but without increased uptake on the PET scan and dotatate scan.   The radiologist suggested that this may be an indolent neoplasm such as pulmonary carcinoid.  Nuclear medicine bone scan (07/09/2022): Uptake at the shoulders, sternoclavicular joints, wrists, knees, ankles/feet, typically degenerative. Uptake at lateral aspects of lower lumbar spine at L4-L5 likely reflecting degenerative changes. No additional worrisome sites of abnormal osseous tracer accumulation are identified. Expected urinary tract and soft tissue distribution of tracer.   IMPRESSION: Scattered degenerative type uptake of tracer at multiple joints and at lower lumbar spine. No scintigraphic evidence of osseous metastatic disease.  CT chest (01/22/2023): Cardiovascular: Atherosclerotic calcification of the aorta with age advanced involvement of the coronary arteries. Enlarged pulmonic trunk and heart. No pericardial effusion.   Mediastinum/Nodes: Left thyroidectomy. No pathologically enlarged mediastinal or axillary lymph nodes. Hilar regions are difficult to definitively evaluate without IV contrast. Esophagus is grossly unremarkable.   Lungs/Pleura: Minimal scattered pulmonary parenchymal scarring. 11 mm right infrahilar intrapulmonary lymph node (  5/72), unchanged from 07/11/2020 but enlarged from 01/12/2015, at which time it measured 4 mm. No suspicious pulmonary nodules. No pleural fluid. Airway is unremarkable.   Upper Abdomen: Visualized portions of the liver gallbladder, adrenal glands, spleen, pancreas and stomach are grossly unremarkable. No upper abdominal adenopathy.   Musculoskeletal:  Degenerative changes in the spine. Intramuscular lipoma along the posterior right scapula. No worrisome lytic or sclerotic lesions.   IMPRESSION: 1. Perihilar right lower lobe nodule is stable from recent prior examinations but has increased in size from 01/12/2015. Indolent carcinoid remains a diagnostic consideration. 2. Age advanced three-vessel coronary artery calcification. 3.  Aortic atherosclerosis (ICD10-I70.0). 4. Enlarged pulmonic trunk, indicative of pulmonary arterial hypertension.  Pt does not have a FH of hypercalcemia, pituitary tumors, thyroid  cancer, or osteoporosis.  Possible FH of kidney stones in father.  Thyroid  nodules: Patient was found to have several thyroid  nodules:  Thyroid  U/S (08/17/2010): Right thyroid  lobe:  Measures 5.1 x 1.5 x 1.8 cm.  Heterogeneous echogenicity.  Left thyroid  lobe:  Measures 8.6 x 3.6 x 4.5 cm.  Heterogeneous echogenicity.  Isthmus: Measures 1.3 cm.   Focal nodules:  There are multiple bilateral thyroid  nodules:   The largest lesion in the right lobe measures 1.4 x 0.8 x 1.0 cm and is in the upper pole region.   There is a large lesion in the isthmus which measures 4.0 x 1.5 x 2.5 cm.   The left lobe is occupied by to large complex thyroid  nodules which  are largely solid but have cystic components.  The largest lesion  measures 4.8 x 3.7 x 4.1 cm and is in the lower aspect of the lobe.   The adjacent lesion measures 4.1 x 2.5 x 4.0 cm.  Fine-needle  aspiration/biopsy of the two left lobe lesions are suggested to  exclude malignancy.   Lymphadenopathy:  None visualized.   IMPRESSION:   Multinodular thyroid  goiter.  The largest lesions in the left  lower lobe are complex and fine-needle aspiration/biopsy is  suggested.  The other lesions can be followed depending on the  pathology.   FNA of the 2 L thyroid  nodules (08/29/2010): Both benign  She had left thyroidectomy + isthmusectomy (10/23/2010):  Thyroid , lobectomy, left  with isthmus - ADENOMATOUS GOITER. - THERE IS NO EVIDENCE OF MALIGNANCY.  Grossly, there are two dominant lesions present. The larger measures 6.2 cm and is present in the left lobe. There is a smaller nodule present in the isthmus, spanning 2.2 cm. Histologic evaluation of both lesions reveals mixed macro and microfollicular adenomas. Malignant features are not identified. (JK:mw 10-24-10)  Thyroid  U/S (10/22/2019): 3 nodules, spongiform and solid/cystic nodular areas that did not need follow-up: Parenchymal Echotexture: Moderately heterogenous  Isthmus: Surgically absent.  Right lobe: 6.2 x 2.1 x 2.8 cm  Left lobe: Surgically absent.  _________________________________________________________   Estimated total number of nodules >/= 1 cm: 3  _________________________________________________________   Nodule # 1:  Prior biopsy: No  Location: Right; Superior  Maximum size: 1.7 cm; Other 2 dimensions: 1.4 x 1.4 cm, previously, 1.4 cm  Composition: mixed cystic and solid (1)  Echogenicity: isoechoic (1) Change in features: Largely cystic and demonstrating interval cystic degeneration since 2012. Change in ACR TI-RADS risk category: Yes This nodule does NOT meet TI-RADS criteria for biopsy or dedicated follow-up.  _________________________________________________________   Nodule # 2:  Location: Right; Inferior  Maximum size: 2.1 cm; Other 2 dimensions: 1.2 x 1.3 cm  Composition: spongiform (0) This nodule does NOT meet  TI-RADS criteria for biopsy or dedicated follow-up.  _________________________________________________________   Three other small vague areas were measured in the tip of the right lobe inferiorly which all appear to be adjacent/part of the same spongiform area referred to as nodule #2 above. Irregardless, these spongiform areas even if they were considered separate nodules do not meet criteria for biopsy or further follow-up.   No abnormal tissue is identified in  the left neck at the level of prior thyroidectomy. No abnormal lymph nodes identified.   IMPRESSION: Residual right lobe of the thyroid  demonstrates spongiform and solid/cystic nodular areas that do not meet criteria for biopsy or further follow-up.   Thyroid  U/S (10/30/2023) Parenchymal Echotexture: Moderately heterogenous  Isthmus: Surgically absent  Right lobe: 6.2 x 2.5 x 2.7 cm  Left lobe: Surgically absent  _________________________________________________________   Estimated total number of nodules >/= 1 cm: 2 _________________________________________________________   Surgical changes of prior left hemithyroidectomy and removal of the thyroid  isthmus. The remaining right thyroid  gland is enlarged and contains numerous cysts and nodules.   Nodule # 1: The small hypoechoic solid nodule in the right upper gland measures 0.9 x 0.6 x 0.6 cm. Findings are consistent with TI-RADS category 4. Given size (<0.9 cm) and appearance, this nodule does NOT meet TI-RADS criteria for biopsy or dedicated follow-up.   Nodule # 2: Spongiform nodule in the right mid gland measures 1.8 x 1.9 x 1.2 cm. TI-RADS category 2. This nodule does NOT meet TI-RADS criteria for biopsy or dedicated follow-up.   Nodule # 3: Hypoechoic solid nodule in the right mid gland measures 0.9 x 0.5 x 0.8 cm. Findings are consistent with TI-RADS category 4. Given size (<0.9 cm) and appearance, this nodule does NOT meet TI-RADS criteria for biopsy or dedicated follow-up.   Nodule # 4: Vague region of heterogeneity in the right lower gland consistent with a pseudo nodule. True nodular margins are not identified.   Nodule # 5: Isoechoic solid nodule in the right lower gland measures 0.9 x 0.8 x 0.7 cm. Findings are consistent with TI-RADS category 3. Given size (<1.4 cm) and appearance, this nodule does NOT meet TI-RADS criteria for biopsy or dedicated follow-up.   IMPRESSION: 1. Surgical changes of prior left  hemithyroidectomy without evidence of residual or recurrent disease. 2. Remaining right thyroid  gland is enlarged and heterogeneous containing multiple sonographically benign and low risk nodules and areas of pseudo nodularity. No individual lesion meets criteria to recommend biopsy or imaging surveillance.  CT chest (01/14/2024): 1. No acute intrathoracic pathology. 2. A 10 mm right perihilar nodule, stable since CT of 10/13/2019 most consistent with a benign or indolent process. Recommend continued follow-up in 12 months to document 5 year stability. 3.  Aortic Atherosclerosis (ICD10-I70.0).  Previous thyroid  tests reviewed: Lab Results  Component Value Date   TSH 1.16 10/21/2023   TSH 1.370 06/21/2022   TSH 0.938 02/15/2020   TSH 0.977 04/29/2019   TSH 0.698 03/22/2019   TSH 0.75 08/14/2016   TSH 1.860 07/31/2014   TSH 1.867 12/10/2010   TSH 0.996 08/16/2010   Lab Results  Component Value Date   FREET4 1.29 02/15/2020   FREET4 1.01 12/10/2010   FREET4 1.07 08/16/2010   + FH of thyroid  ds. In M and sister.  DM2: - dx'ed 2015-2016 at the time of her ovarian surgery - insulin  soon after DM, but was able to stop this due to good control of  Reviewed HbA1c levels: Lab Results  Component Value Date  HGBA1C 5.6 01/22/2024   HGBA1C 6.0 (H) 07/23/2023   HGBA1C 6.4 04/23/2023   HGBA1C 6.3 01/20/2023   HGBA1C 6.0 09/17/2022   HGBA1C 5.5 05/17/2022   HGBA1C 5.5 01/09/2022   HGBA1C 5.7 10/04/2021   HGBA1C 6.2 (H) 06/26/2021   HGBA1C 7.0 (H) 03/07/2021   HGBA1C 7.2 (H) 11/29/2020   HGBA1C 5.6 06/14/2020   HGBA1C 6.9 (H) 04/17/2020   HGBA1C 9.0 (H) 01/11/2020   HGBA1C 9.8 (A) 09/23/2019   HGBA1C 9.3 (A) 03/22/2019   HGBA1C 10.8 (H) 12/04/2018   HGBA1C 9.6 (H) 08/28/2018   HGBA1C 7.6 (A) 05/22/2018   HGBA1C 7.6 (A) 02/20/2018   On metformin  and Mounjaro .  Managed by PCP/weight management specialist. Please see my previous note for details.  Pt. also has a history  of HTN, GERD, B12 deficiency, obesity class II, depression, and also history of ovarian cancer for which she had chemotherapy in 2016, with subsequent peripheral neuropathy-on gabapentin .  Also, she was found to have aortic atherosclerosis on the CT scan from 07/2020.  ROS: + see HPI  Past Medical History:  Diagnosis Date   Arthritis    Diabetes mellitus without complication (HCC)    Family history of adverse reaction to anesthesia    sister-n/v   Family history of colon cancer    GERD (gastroesophageal reflux disease)    Glaucoma 07/2014   Glucagonoma 07/28/2014   Pt denies this but states she has glaucoma   History of chemotherapy    History of hiatal hernia    Hypercalcemia    Hypertension    Imbalance    Neuropathy    feet bilat   Nocturia    Peritoneal carcinomatosis (HCC)    carcinoma of ovary    Shortness of breath dyspnea    hx of 2013 - no problems currently    Sleep apnea    Thyroid  nodule    history of    Wears glasses    Past Surgical History:  Procedure Laterality Date   ABDOMINAL HYSTERECTOMY     CATARACT EXTRACTION     RT eye 11/28/22 and LT eye 12/19/2022   FOOT SURGERY  04/2018   [facility]   HERNIA REPAIR     INCISIONAL HERNIA REPAIR N/A 07/06/2015   Procedure: INCISIONAL HERNIA REPAIR ;  Surgeon: Donnice Bury, MD;  Location: [facility];  Service: General;  Laterality: N/A;   INCISIONAL HERNIA REPAIR N/A 07/03/2021   Procedure: SHIRLENE HERNIA REPAIR WITH MESH;  Surgeon: Rubin Calamity, MD;  Location: [facility] OR;  Service: General;  Laterality: N/A;   INSERTION OF MESH N/A 07/06/2015   Procedure: WITH INSERTION OF PHASIX ST MESH;  Surgeon: Donnice Bury, MD;  Location: [facility];  Service: General;  Laterality: N/A;   LAPAROTOMY N/A 07/06/2015   Procedure: EXPLORATORY LAPAROTOMY;  Surgeon: Maurilio Ship, MD;  Location: [facility];  Service: Gynecology;  Laterality: N/A;   LYSIS OF ADHESION N/A 07/06/2015   Procedure:  LYSIS OF ADHESION RESECTION OF  MESENTERIC MASS BOWEL RESECTION ;  Surgeon: Maurilio Ship, MD;  Location: [facility];  Service: Gynecology;  Laterality: N/A;   LYSIS OF ADHESION N/A 07/03/2021   Procedure: EXPLORATORY LAPAROTOMY WITH LYSIS OF ADHESION;  Surgeon: Rubin Calamity, MD;  Location: [facility] OR;  Service: General;  Laterality: N/A;   ROBOTIC ASSISTED TOTAL HYSTERECTOMY WITH BILATERAL SALPINGO OOPHERECTOMY Bilateral 07/28/2014   Procedure: ROBOTIC ASSISTED lysis of adhesions with biopsies, converted to LAPAROTOMY, bilateral salpingoorphorectomy, omentectomy,appendectomy;  Surgeon: Sari Bachelor, MD;  Location: [facility];  Service: Gynecology;  Laterality: Bilateral;   THYROIDECTOMY, PARTIAL     VIDEO BRONCHOSCOPY N/A 01/13/2020   Procedure: VIDEO BRONCHOSCOPY WITH BIOPSIES;  Surgeon: Kerrin Elspeth BROCKS, MD;  Location: [facility];  Service: Thoracic;  Laterality: N/A;   Social History   Socioeconomic History   Marital status: Married    Spouse name: Not on file   Number of children: 2   Years of education: Not on file   Highest education level: 12th grade  Occupational History   Occupation: disability  Tobacco Use   Smoking status: Never   Smokeless tobacco: Never  Vaping Use   Vaping status: Never Used  Substance and Sexual Activity   Alcohol use: No   Drug use: No   Sexual activity: Not Currently    Birth control/protection: None  Other Topics Concern   Not on file  Social History Narrative   No issues with transportation.    Attends church   Social Drivers of Health   Financial Resource Strain: Medium Risk (01/22/2024)   Overall Financial Resource Strain (CARDIA)    Difficulty of Paying Living Expenses: Somewhat hard  Food Insecurity: Food Insecurity Present (01/22/2024)   Hunger Vital Sign    Worried About Running Out of Food in the Last Year: Sometimes true    Ran Out of Food in the Last Year: Never true  Transportation Needs: No Transportation Needs (01/22/2024)   PRAPARE - Scientist, Research (physical Sciences) (Medical): No    Lack of Transportation (Non-Medical): No  Physical Activity: Sufficiently Active (01/22/2024)   Exercise Vital Sign    Days of Exercise per Week: 3 days    Minutes of Exercise per Session: 50 min  Stress: Stress Concern Present (01/22/2024)   [employer] of Occupational Health - Occupational Stress Questionnaire    Feeling of Stress: To some extent  Social Connections: Moderately Integrated (01/22/2024)   Social Connection and Isolation Panel    Frequency of Communication with Friends and Family: More than three times a week    Frequency of Social Gatherings with Friends and Family: More than three times a week    Attends Religious Services: More than 4 times per year    Active Member of [employer] or Organizations: Yes    Attends Banker Meetings: More than 4 times per year    Marital Status: Separated  Intimate Partner Violence: Not At Risk (01/22/2024)   Humiliation, Afraid, Rape, and Kick questionnaire    Fear of Current or Ex-Partner: No    Emotionally Abused: No    Physically Abused: No    Sexually Abused: No   Current Outpatient Medications on File Prior to Visit  Medication Sig Dispense Refill   ACCU-CHEK GUIDE TEST test strip USE TO CHECK BLOOD SUGAR 3 TIMES DAILY. 300 strip 3   Accu-Chek Softclix Lancets lancets USE TO CHECK BLOOD SUGAR 3 TIMES DAILY. 300 each 3   albuterol  (VENTOLIN  HFA) 108 (90 Base) MCG/ACT inhaler Inhale 2 puffs into the lungs every 6 (six) hours as needed for wheezing or shortness of breath. 8 g 0   amLODipine  (NORVASC ) 10 MG tablet TAKE 1 TABLET EVERY DAY 90 tablet 3   Blood Glucose Monitoring Suppl (ACCU-CHEK GUIDE) w/Device KIT Use to check blood sugar 3 times daily. 1 kit 0   buPROPion  (WELLBUTRIN  SR) 200 MG 12 hr tablet Take 1 tablet (200 mg total) by mouth 2 (two) times daily. 180 tablet 0   Cholecalciferol (VITAMIN D3) 25 MCG (1000  UT) CAPS Take 1 capsule (1,000 Units total) by mouth daily. Per Endo in  10/2022 60 capsule    Continuous Glucose Receiver (FREESTYLE LIBRE 3 READER) DEVI USE AS DIRECTED 6 each 3   Continuous Glucose Sensor (FREESTYLE LIBRE 3 SENSOR) MISC USE AS DIRECTED TO CHECK BLOOD GLUCOSE CONTINUOUSLY - CHANGE SENSOR EVERY 14 DAYS 2 each 3   cyanocobalamin  (VITAMIN B12) 500 MCG tablet 500 mcg every other day     dorzolamide -timolol  (COSOPT ) 2-0.5 % ophthalmic solution Place 1 drop into both eyes 2 (two) times daily. 10 mL 3   DULoxetine  (CYMBALTA ) 20 MG capsule TAKE 1 CAPSULE DAILY. START AFTER TAPERING OFF GABAPENTIN  90 capsule 0   fluticasone  (FLONASE ) 50 MCG/ACT nasal spray USE 2 SPRAYS IN EACH NOSTRIL EVERY DAY 48 g 3   hydrALAZINE  (APRESOLINE ) 50 MG tablet Take 1 tablet (50 mg total) by mouth 3 (three) times daily. 270 tablet 3   Insulin  Pen Needle (DROPLET PEN NEEDLES) 29G X MISC USE AS DIRECTED 100 each 0   Lancets Misc. (ACCU-CHEK SOFTCLIX LANCET DEV) KIT Check blood sugars 3 times a day. 3 kit 6   latanoprost  (XALATAN ) 0.005 % ophthalmic solution Place 1 drop into both eyes every evening at bedtime as directed 2.5 mL 6   levocetirizine (XYZAL ) 5 MG tablet Take 1 tablet (5 mg total) by mouth every evening. 90 tablet 0   lidocaine  (LIDODERM ) 5 % APPLY 1 PATCH ONTO THE SKIN DAILY. REMOVE AND DISCARD PATCH WITHIN 12 HOURS OR AS DIRECTED BY MD 90 patch 1   losartan  (COZAAR ) 50 MG tablet TAKE 1 TABLET EVERY DAY 90 tablet 2   metFORMIN  (GLUCOPHAGE ) 500 MG tablet TAKE 1 TABLET EVERY DAY WITH BREAKFAST 90 tablet 3   methocarbamol  (ROBAXIN ) 500 MG tablet Take 500 mg by mouth 2 (two) times daily.     montelukast  (SINGULAIR ) 10 MG tablet Take 1 tablet (10 mg total) by mouth at bedtime. 90 tablet 0   omega-3 acid ethyl esters (LOVAZA ) 1 g capsule Take 2 capsules (2 g total) by mouth 2 (two) times daily. 180 capsule 0   omeprazole  (PRILOSEC) 20 MG capsule Take 1 capsule (20 mg total) by mouth daily. 90 capsule 1   ondansetron  (ZOFRAN ) 4 MG tablet Take 1 tablet by mouth every 6  hours as needed severe nausea 30 tablet 0   polyethylene glycol powder (GLYCOLAX /MIRALAX ) 17 GM/SCOOP powder Take 17 g by mouth 2 (two) times daily as needed.     rosuvastatin  (CRESTOR ) 20 MG tablet TAKE 1 TABLET EVERY DAY 90 tablet 3   solifenacin  (VESICARE ) 5 MG tablet TAKE 1 TABLET EVERY DAY 90 tablet 3   tirzepatide  (MOUNJARO ) 12.5 MG/0.5ML Pen Inject 12.5 mg into the skin once a week. 2 mL 0   No current facility-administered medications on file prior to visit.   Allergies  Allergen Reactions   Emend [Aprepitant] Other (See Comments)    Urticaria    Lisinopril  Cough   Family History  Problem Relation Age of Onset   Hypertension Mother    Hyperlipidemia Mother    Stroke Mother    Thyroid  disease Mother    Arthritis Mother    Hearing loss Mother    Hypertension Father    Diabetes Father    Hyperlipidemia Father    Sudden death Father    Arthritis Father    Heart disease Father    Obesity Father    Cancer Sister 63       fibrosarcoma (back); currently  55   Cancer Brother    Prostate cancer Maternal Uncle    Colon cancer Paternal Aunt        Dx 87s; deceased 90s   Prostate cancer Paternal Uncle        currently 56   Cancer Paternal Uncle 92       bone ; unk. primary   Stomach cancer Paternal Uncle    Hypercalcemia Neg Hx    PE: BP 136/84   Pulse 77   Resp 16   Ht 5' 4 (1.626 m)   Wt 175 lb 12.8 oz (79.7 kg)   LMP  (LMP Unknown)   SpO2 99%   BMI 30.18 kg/m  Wt Readings from Last 20 Encounters:  04/20/24 175 lb 12.8 oz (79.7 kg)  04/06/24 171 lb (77.6 kg)  03/09/24 174 lb (78.9 kg)  02/24/24 176 lb (79.8 kg)  02/03/24 178 lb (80.7 kg)  01/22/24 186 lb (84.4 kg)  01/20/24 181 lb (82.1 kg)  01/07/24 184 lb (83.5 kg)  12/24/23 190 lb (86.2 kg)  11/25/23 191 lb (86.6 kg)  10/22/23 200 lb (90.7 kg)  10/21/23 204 lb 9.6 oz (92.8 kg)  10/09/23 203 lb (92.1 kg)  09/25/23 204 lb (92.5 kg)  09/22/23 206 lb (93.4 kg)  09/10/23 203 lb (92.1 kg)  08/27/23  202 lb (91.6 kg)  08/06/23 200 lb (90.7 kg)  07/23/23 200 lb (90.7 kg)  07/09/23 199 lb (90.3 kg)   Constitutional: overweight, in NAD Eyes: EOMI, no exophthalmos ENT: no thyromegaly, no cervical lymphadenopathy Cardiovascular: RRR, No MRG Respiratory: CTA B Musculoskeletal: no deformities Skin: no rashes Neurological: + tremor with outstretched hands,  Assessment: 1. Hypercalcemia  2. Thyroid  nodules  3. DM2 - now well-controlled - per PCP  Plan: Complex patient with history of elevated calcium  since 2020, with previous normal calcium  level.  Her highest calcium  level was 12.3 at last visit, however, a corrected calcium  for albumin was 11.7 at that time, still elevated.  She previously had a PTH of 16 for a calcium  of 12.2 but the PTH increased afterwards and it was 41 at last visit for the corrected calcium  of 11.7.  Vitamin D  levels are normal on 1000 units vitamin D  daily.  She has no apparent complications from hypercalcemia.  No osteoporosis, fractures, nephrolithiasis.  She does have a history of constipation which could be in the setting of Mounjaro . -We checked her for primary hyperparathyroidism but the investigation was inconclusive.  She initially had a low 24-hour urine calcium , normal calcitriol, low phosphate, normal magnesium  and a normal vitamin A  and PTH rp.  PTH RP was previously normal, also.  We did try to check it magnesium  sestamibi scan but this was not covered by her insurance.  We previously checked a bone scan and this was negative for increased uptake.  We then repeated the 24-hour urine calcium  and this was normal but the creatinine level was not reported at that time.  My suspicion for FHH (familial hypercalcemic hypercalciuria) is low, since she only started to have elevated serum calcium  in 2020 and patients with this condition usually have high calcium  for the entire life.  She previously had elevated free light chains but she was investigated by hematology  and cleared (she also had a bone marrow biopsy at that time) for multiple myeloma or MGUS.  We checked an SPEP and this was normal.  She also had a skeletal survey which showed no evidence of multiple myeloma or neoplastic bone  disease in 06/2019.  A PET scan showed a 1.1 cm right lung nodule, which was read as possible pulmonary carcinoid.  An elevated FGF 23 and chromogranin A could be consistent with carcinoid, however, PCP informed me that this nodule was actually biopsied and the results were benign, consistent with inflammatory changes. CT chest from 01/14/2024 did not show any new lesions and the pulmonary nodule was stable in size -At last visit, we checked a TSH, calcitonin, PTH, calcium , vitamin D , and CMP.  She had an elevated calcium  with a nonsuppressed PTH while the 24-hour urine calcium  was low.  The rest of the laboratories are normal.  At this point, there is a suspicion for familial hypercalcemic hypercalciuria but again, with normal calcium  levels, this would be highly unlikely.  She most likely still has primary hyperparathyroidism so we discussed about trying again to obtain a technetium scan.  I ordered this today. - at today's visit, we will check again her vitamin D , calcium , PTH, but will add an FGF 23 and a phosphorus level.  Was not rechecked a chromogranin A since she is on a PPI and this can generate an elevated result - Referral to [facility] for a second opinion with Dr. Debby Patient or referral to surgery for exploratory parathyroidectomy are also options, especially if a parathyroid  scan is not covered by her insurance.  - I will see her back in 6 months  2.  Thyroid  nodules -Patient has a history of multinodular goiter and is status post left lobectomy + isthmusectomy in 2012. -In the right lobe, she has 3 thyroid  nodules, which appear to be benign per review of the thyroid  ultrasound report from 10/2019.  One of the nodules was spongiform and the others appeared more like pseudo  nodules or areas of inflammation.  It is interesting that the right side of the thyroid  had an increased FDG uptake on her PET scan from 2021.  We repeated her thyroid  ultrasound 10/30/2023 and this showed an enlarged right lobe with heterogeneity but with low risk nodules and pseudo nodules for which no imaging follow-up was recommended. -Her thyroid  tests were normal at last check.  We will not repeat this today.  Of note, we also checked a calcitonin to screen for medullary thyroid  carcinoma and this was not elevated. - She has no neck compression symptoms - No further follow-up is needed for this  Orders Placed This Encounter  Procedures   NM Parathyroid  W/Spect/CT   Calcium , ionized   Parathyroid  hormone, intact (no Ca)   VITAMIN D  25 Hydroxy (Vit-D Deficiency, Fractures)   Phosphorus   FGF-23 Fibroblast Growth Factor 23   Component     Latest Ref Rng 04/20/2024  PTH, Intact     16 - 77 pg/mL 44   Vitamin D , 25-Hydroxy     30 - 100 ng/mL 69   Phosphorus     2.5 - 4.5 mg/dL 2.5   Calcium  Ionized     4.7 - 5.5 mg/dL 6.4 (H)   Calcium  elevated, with a normal vitamin D  and a nonsuppressed PTH.  I am hoping the insurance would approve the parathyroid  scan.  If not, I wonder if we can try Cinacalcet, although this would not be a first-line treatment  in this situation. FGF23 still pending.  Lela Fendt, MD PhD [facility] Endocrinology

## 2024-04-20 NOTE — Telephone Encounter (Signed)
 Requested Prescriptions  Refused Prescriptions Disp Refills   Continuous Glucose Sensor (FREESTYLE LIBRE 3 SENSOR) MISC [Pharmacy Med Name: FREESTYLE LIBRE 3/SENSOR/GLUCOSE MONITORING SYSTEM]  3    Sig: USE AS DIRECTED TO CHECK BLOOD GLUCOSE CONTINUOUSLY - CHANGE SENSOR EVERY 14 DAYS     Endocrinology: Diabetes - Testing Supplies Passed - 04/20/2024  2:10 PM      Passed - Valid encounter within last 12 months    Recent Outpatient Visits           2 months ago Type 2 diabetes mellitus with obesity   [facility] Dept Of [place]. [facility] Vicci Sober B, MD   7 months ago Type 2 diabetes mellitus with obesity   [facility] [place] - [facility] Dept Of [place]. [facility] Vicci Sober B, MD   11 months ago Type 2 diabetes mellitus with obesity   [facility] Dept Of [place]. [facility] Vicci Sober NOVAK, MD   1 year ago Type 2 diabetes mellitus with obesity   [facility] Dept Of [place]. [facility] Vicci Sober NOVAK, MD   1 year ago Type 2 diabetes mellitus with obesity   [facility] [facility] - A Dept Of [place]. [facility] Vicci Sober B, MD               lidocaine  (LIDODERM ) 5 % [Pharmacy Med Name: LIDOCAINE  5 % External Patch] 90 patch 3    Sig: APPLY 1 PATCH ONTO THE SKIN DAILY. REMOVE AND DISCARD PATCH WITHIN 12 HOURS OR AS DIRECTED BY MD     Analgesics:  Topicals Failed - 04/20/2024  2:10 PM      Failed - Manual Review: Labs are only required if the patient has taken medication for more than 8 weeks.      Failed - PLT in normal range and within 360 days    Platelets  Date Value Ref Range Status  03/28/2022 318 150 - 400 K/uL Final  12/04/2018 332 150 - 450 x10E3/uL Final   Platelet Count  Date Value Ref Range Status  02/03/2020 270 150 - 400 K/uL Final         Failed - HGB in normal range and within 360 days     Hemoglobin  Date Value Ref Range Status  03/28/2022 15.0 12.0 - 15.0 g/dL Final  91/73/7978 86.6 12.0 - 15.0 g/dL Final  93/73/7979 86.3 11.1 - 15.9 g/dL Final   HGB  Date Value Ref Range Status  04/22/2016 12.9 11.6 - 15.9 g/dL Final         Failed - HCT in normal range and within 360 days    HCT  Date Value Ref Range Status  03/28/2022 46.8 (H) 36.0 - 46.0 % Final  04/22/2016 39.0 34.8 - 46.6 % Final   Hematocrit  Date Value Ref Range Status  12/04/2018 41.0 34.0 - 46.6 % Final         Failed - Cr in normal range and within 360 days    Creatinine  Date Value Ref Range Status  11/20/2016 1.1 0.6 - 1.1 mg/dL Final   Creat  Date Value Ref Range Status  10/21/2023 1.10 (H) 0.50 - 1.03 mg/dL Final   Creatinine, POC  Date Value Ref Range Status  11/13/2016 100 mg/dL Final   Creatinine, Urine  Date Value Ref Range  Status  07/27/2015 174 20 - 320 mg/dL Final         Passed - eGFR is 30 or above and within 360 days    GFR, Est African American  Date Value Ref Range Status  07/08/2014 73 mL/min Final   GFR, Est AFR Am  Date Value Ref Range Status  12/29/2019 56 (L) >60 mL/min Final   GFR calc Af Amer  Date Value Ref Range Status  04/17/2020 69 >59 mL/min/1.73 Final    Comment:    **In accordance with recommendations from the NKF-ASN Task force,**   [facility] is in the process of updating its eGFR calculation to the   2021 CKD-EPI creatinine equation that estimates kidney function   without a race variable.    GFR, Est Non African American  Date Value Ref Range Status  07/08/2014 63 mL/min Final    Comment:      The estimated GFR is a calculation valid for adults (>=24 years old) that uses the CKD-EPI algorithm to adjust for age and sex. It is   not to be used for children, pregnant women, hospitalized patients,    patients on dialysis, or with rapidly changing kidney function. According to the NKDEP, eGFR >89 is normal, 60-89 shows mild impairment, 30-59  shows moderate impairment, 15-29 shows severe impairment and <15 is ESRD.      GFR, Estimated  Date Value Ref Range Status  03/28/2022 47 (L) >60 mL/min Final    Comment:    (NOTE) Calculated using the CKD-EPI Creatinine Equation (2021)   12/29/2019 48 (L) >60 mL/min Final   eGFR  Date Value Ref Range Status  10/21/2023 59 (L) > OR = 60 mL/min/1.6m2 Final  07/23/2023 46 (L) >59 mL/min/1.73 Final         Passed - Patient is not pregnant      Passed - Valid encounter within last 12 months    Recent Outpatient Visits           2 months ago Type 2 diabetes mellitus with obesity   [facility] Dept Of [place]. [facility] Vicci Sober B, MD   7 months ago Type 2 diabetes mellitus with obesity   [facility] [facility] [place] - [facility] Dept Of [place]. [facility] Vicci Sober B, MD   11 months ago Type 2 diabetes mellitus with obesity   [facility] [place] - [facility] Dept Of [place]. [facility] Vicci Sober NOVAK, MD   1 year ago Type 2 diabetes mellitus with obesity   [place] [facility] - A Dept Of [place]. [facility] Vicci Sober NOVAK, MD   1 year ago Type 2 diabetes mellitus with obesity   [facility] - A Dept Of [place]. [facility] Vicci Sober NOVAK, MD

## 2024-04-20 NOTE — Addendum Note (Signed)
 Addended by: TRIXIE FILE on: 04/20/2024 08:59 AM   Modules accepted: Orders

## 2024-04-20 NOTE — Patient Instructions (Signed)
 Please stop at the lab.  We will try again to get a parathyroid  scan.  Please return in 6 months.

## 2024-04-21 LAB — PARATHYROID HORMONE, INTACT (NO CA): PTH: 44 pg/mL (ref 16–77)

## 2024-04-21 LAB — PHOSPHORUS: Phosphorus: 2.5 mg/dL (ref 2.5–4.5)

## 2024-04-21 LAB — VITAMIN D 25 HYDROXY (VIT D DEFICIENCY, FRACTURES): Vit D, 25-Hydroxy: 69 ng/mL (ref 30–100)

## 2024-04-21 LAB — CALCIUM, IONIZED: Calcium, Ion: 6.4 mg/dL — ABNORMAL HIGH (ref 4.7–5.5)

## 2024-04-27 ENCOUNTER — Ambulatory Visit: Payer: Self-pay | Admitting: Internal Medicine

## 2024-04-27 ENCOUNTER — Encounter (INDEPENDENT_AMBULATORY_CARE_PROVIDER_SITE_OTHER): Payer: Self-pay | Admitting: Family Medicine

## 2024-04-27 ENCOUNTER — Ambulatory Visit (INDEPENDENT_AMBULATORY_CARE_PROVIDER_SITE_OTHER): Payer: Self-pay | Admitting: Family Medicine

## 2024-04-27 VITALS — BP 135/81 | HR 72 | Temp 97.7°F | Ht 64.0 in | Wt 172.0 lb

## 2024-04-27 DIAGNOSIS — F39 Unspecified mood [affective] disorder: Secondary | ICD-10-CM

## 2024-04-27 DIAGNOSIS — E669 Obesity, unspecified: Secondary | ICD-10-CM

## 2024-04-27 DIAGNOSIS — E66811 Obesity, class 1: Secondary | ICD-10-CM

## 2024-04-27 DIAGNOSIS — Z6829 Body mass index (BMI) 29.0-29.9, adult: Secondary | ICD-10-CM | POA: Diagnosis not present

## 2024-04-27 DIAGNOSIS — Z7984 Long term (current) use of oral hypoglycemic drugs: Secondary | ICD-10-CM

## 2024-04-27 DIAGNOSIS — E782 Mixed hyperlipidemia: Secondary | ICD-10-CM | POA: Diagnosis not present

## 2024-04-27 DIAGNOSIS — Z7985 Long-term (current) use of injectable non-insulin antidiabetic drugs: Secondary | ICD-10-CM

## 2024-04-27 DIAGNOSIS — F5089 Other specified eating disorder: Secondary | ICD-10-CM | POA: Diagnosis not present

## 2024-04-27 DIAGNOSIS — E1169 Type 2 diabetes mellitus with other specified complication: Secondary | ICD-10-CM | POA: Diagnosis not present

## 2024-04-27 MED ORDER — OMEGA-3-ACID ETHYL ESTERS 1 G PO CAPS: 2.0000 g | ORAL_CAPSULE | Freq: Two times a day (BID) | ORAL | 0 refills | Status: AC

## 2024-04-27 NOTE — Progress Notes (Signed)
 Sarah [name], D.O.  ABFM, ABOM Specializing in Clinical Bariatric Medicine  Office located at: [address] [place], [place]  [address]      A) FOR THE CHRONIC DISEASE OF OBESITY:  Chief complaint: Obesity Sarah [name] is here to discuss her progress with her obesity treatment plan.   History of present illness / Interval history:  Sarah [name] is here today for her follow-up office visit.  Since last OV on 04/06/24, pt is up 1 lb. Patient states that she is frustrated with herself but is journaling on some days and is still getting all of her protein in.     04/06/24 10:00 04/27/24 10:00   Body Fat % 36.3 % 37.4 %  Muscle Mass (lbs) 103.6 lbs 102.8 lbs  Fat Mass (lbs) 62.2 lbs 64.6 lbs  Total Body Water  (lbs) 65.4 lbs 67.2 lbs  Visceral Fat Rating  9 9    Counseling done on how various foods will affect these numbers and how to maximize success.  Total lbs lost to date: - 56 lbs Total Fat Mass in lbs lost to date: - 33 lbs Total weight loss percentage to date: - 24.56 %    Obesity with starting BMI 37.9/date 02/15/2020 Obesity (BMI 30.0-34.9) current BMI 29.51 Nutrition Therapy She is journaling 1200-1300 cal and 85++ g protein with the Category 2 MP as a guide and states she is following her eating plan approximately 60 % of the time.   - Tracking Calories/Macros: yes  - Eating More Whole Foods: yes  - Adequate Protein Intake: yes  - Adequate Water  Intake: yes  - Skipping Meals: yes  - Sleeping 7-9 Hours/ Night: no    Sarah [name] is currently in the action stage of change. As such, her goal is to continue weight management plan.  She has agreed to: continue current plan   Physical Activity Arleatha is doing silver sneakers 50 minutes 3 days per week and  walking 15 to 20 minutes 2 days per week.    Sarah [name] has been advised to work up to 300-450 minutes of moderate intensity aerobic activity a week and strengthening exercises 2-3 times per week for  cardiovascular health, weight loss maintenance and preservation of muscle mass.  She has agreed to : Increase volume of physical activity to a goal of 240 minutes a week and Combine aerobic and strengthening exercises for efficiency and improved cardiometabolic health.   Behavioral Modifications Evidence-based interventions for health behavior change were utilized today including the discussion of  1) self care:    - Obtain a counselor   2) SMART goals for next OV:    - Journal accurately   Regarding patient's less desirable eating habits and patterns, we employed the technique of small changes.   We discussed the following today: increasing lean protein intake to established goals, avoiding skipping meals, and reading food labels  Additional resources provided today: Handout on Daily Food Journaling Log   Medical Interventions/ Pharmacotherapy Previous Bariatric surgery: n/a Pharmacotherapy for weight loss: She is currently taking Wellbutrin  SR 200 mg BID, Metformin  500 mg daily and Mounjaro  15 mg weekly for medical weight loss.    We discussed various medication options to help Sarah [name] with her weight loss efforts and we both agreed to : Adequate clinical response to anti-obesity medication, continue current regimen   B) OBESITY RELATED CONDITIONS ADDRESSED TODAY:  Type 2 diabetes mellitus in patient with obesity [facility]) Assessment & Plan Lab Results  Component Value Date  HGBA1C 5.6 01/22/2024   HGBA1C 6.0 (H) 07/23/2023   HGBA1C 6.4 04/23/2023    On Metformin  500 mg daily and Mounjaro  15 mg. With good compliance and tolerance. Patient states that her hunger and cravings are well controlled. She endorses getting all her protein in. She has not taken the 12.5 mg dosage yet due to still having the 15 mg left over. Continue with medications. Will re obtain labs as medically necessary. Continue following prudent meal plan and decreasing simple carbs and sugars.     Depressed mood  (HCC) with emotional eating Assessment & Plan On Wellbutrin  SR 200 mg two times daily and Cymbalta  20 mg with reported good compliance and tolerance. Patient states that she recently given information that made her a little sad. She states that her mood has been declining but has not reached the level she was previously at. She endorses that the Wellbutrin  has helped tremendously slow the progression of her mood. Recommended that patient find a counselor to help and be able to build strategies. Continue with medications.     Hypercalcemia Assessment & Plan Patient has calcium  labs taken on 04/20/24 and results show an increase calcium  level. Current levels are 6.4 and highest they have been. Patient states that her specialist is running tests on her and if she gets results back that don't help solidify a diagnosis she will sent the patient to [facility] to get further testing. Patient states that this has affected her mood. Recommended that patient look into lowering her calcium  intake in foods and be more aware of food labels. Will follow along side specialist and PCP.     Mixed diabetic hyperlipidemia associated with type 2 diabetes mellitus [facility]) Assessment & Plan Lab Results  Component Value Date   CHOL 161 07/23/2023   HDL 47 07/23/2023   LDLCALC 86 07/23/2023   TRIG 162 (H) 07/23/2023   CHOLHDL 3.4 07/23/2023    On Lovaza  2 g twice daily. Patient states that she has been taking 1 pill twice a day and not the 2 pills twice daily. Stressed the importance of having a controlled lipid panel. Continue with medication making sure she takes 2 pills twice a day. Will obtain labs in the near future. Will refill today.    Medications Discontinued During This Encounter  Medication Reason   omega-3 acid ethyl esters (LOVAZA ) 1 g capsule Reorder     Meds ordered this encounter  Medications   omega-3 acid ethyl esters (LOVAZA ) 1 g capsule    Sig: Take 2 capsules (2 g total) by mouth 2 (two) times  daily.    Dispense:  360 capsule    Refill:  0      Follow up:   Return 05/18/2024 at 8:20 AM  She was informed of the importance of frequent follow up visits to maximize her success with intensive lifestyle modifications for her multiple health conditions.   Weight Summary and Biometrics   Weight Lost Since Last Visit: 0lb  Weight Gained Since Last Visit: 1lb   Vitals Temp: 97.7 F (36.5 C) BP: 135/81 Pulse Rate: 72 SpO2: 99 %   Anthropometric Measurements Height: 5' 4 (1.626 m) Weight: 172 lb (78 kg) BMI (Calculated): 29.51 Weight at Last Visit: 171lb Weight Lost Since Last Visit: 0lb Weight Gained Since Last Visit: 1lb Starting Weight: 228lb Total Weight Loss (lbs): 56 lb (25.4 kg) Peak Weight: 228lb   Body Composition  Body Fat %: 37.4 % Fat Mass (lbs): 64.6 lbs Muscle Mass (lbs):  102.8 lbs Total Body Water  (lbs): 67.2 lbs Visceral Fat Rating : 9   Other Clinical Data Fasting: no Labs: no Today's Visit #: 92 Starting Date: 02/15/20    Objective:   PHYSICAL EXAM: Blood pressure 135/81, pulse 72, temperature 97.7 F (36.5 C), height 5' 4 (1.626 m), weight 172 lb (78 kg), SpO2 99%. Body mass index is 29.52 kg/m.  General: she is overweight, cooperative and in no acute distress. PSYCH: Has normal mood, affect and thought process.   HEENT: EOMI, sclerae are anicteric. Lungs: Normal breathing effort, no conversational dyspnea. Extremities: Moves * 4 Neurologic: A and O * 3, good insight  DIAGNOSTIC DATA REVIEWED: BMET    Component Value Date/Time   NA 139 10/21/2023 0926   NA 143 07/23/2023 0925   NA 138 04/22/2016 0750   K 4.1 10/21/2023 0926   K 3.9 04/22/2016 0750   CL 104 10/21/2023 0926   CO2 26 10/21/2023 0926   CO2 26 04/22/2016 0750   GLUCOSE 102 (H) 10/21/2023 0926   GLUCOSE 142 (H) 04/22/2016 0750   BUN 17 10/21/2023 0926   BUN 19 07/23/2023 0925   BUN 19.1 11/20/2016 1355   CREATININE 1.10 (H) 10/21/2023 0926   CREATININE  1.1 11/20/2016 1355   CALCIUM  12.3 (H) 10/21/2023 0926   CALCIUM  9.8 04/22/2016 0750   GFRNONAA 47 (L) 03/28/2022 1508   GFRNONAA 48 (L) 12/29/2019 1015   GFRNONAA 63 07/08/2014 1212   GFRAA 69 04/17/2020 0928   GFRAA 56 (L) 12/29/2019 1015   GFRAA 73 07/08/2014 1212   Lab Results  Component Value Date   HGBA1C 5.6 01/22/2024   HGBA1C 10.1 (H) 07/07/2015   No results found for: INSULIN  Lab Results  Component Value Date   TSH 1.16 10/21/2023   CBC    Component Value Date/Time   WBC 5.4 03/28/2022 1508   RBC 4.97 03/28/2022 1508   HGB 15.0 03/28/2022 1508   HGB 13.3 02/03/2020 0837   HGB 13.6 12/04/2018 0914   HGB 12.9 04/22/2016 0750   HCT 46.8 (H) 03/28/2022 1508   HCT 41.0 12/04/2018 0914   HCT 39.0 04/22/2016 0750   PLT 318 03/28/2022 1508   PLT 270 02/03/2020 0837   PLT 332 12/04/2018 0914   MCV 94.2 03/28/2022 1508   MCV 88 12/04/2018 0914   MCV 92.2 04/22/2016 0750   MCH 30.2 03/28/2022 1508   MCHC 32.1 03/28/2022 1508   RDW 13.6 03/28/2022 1508   RDW 13.6 12/04/2018 0914   RDW 13.9 04/22/2016 0750   Iron Studies    Component Value Date/Time   IRON 43 08/18/2010 0515   TIBC 389 08/18/2010 0515   FERRITIN 227 08/18/2010 0515   IRONPCTSAT 11 (L) 08/18/2010 0515   Lipid Panel     Component Value Date/Time   CHOL 161 07/23/2023 0925   TRIG 162 (H) 07/23/2023 0925   HDL 47 07/23/2023 0925   CHOLHDL 3.4 07/23/2023 0925   CHOLHDL 5.1 (H) 08/14/2016 1729   VLDL 74 (H) 08/14/2016 1729   LDLCALC 86 07/23/2023 0925   Hepatic Function Panel     Component Value Date/Time   PROT 7.9 10/21/2023 0926   PROT 7.3 07/23/2023 0925   PROT 8.2 04/22/2016 0750   ALBUMIN 4.6 07/23/2023 0925   ALBUMIN 3.8 04/22/2016 0750   AST 26 10/21/2023 0926   AST 29 12/29/2019 1015   AST 27 04/22/2016 0750   ALT 24 10/21/2023 0926   ALT 33 12/29/2019 1015   ALT  24 04/22/2016 0750   ALKPHOS 82 07/23/2023 0925   ALKPHOS 74 04/22/2016 0750   BILITOT 0.6 10/21/2023 0926    BILITOT 0.5 07/23/2023 0925   BILITOT 0.6 12/29/2019 1015   BILITOT 0.44 04/22/2016 0750   BILIDIR 0.2 08/18/2010 0515   IBILI 1.5 (H) 08/18/2010 0515      Component Value Date/Time   TSH 1.16 10/21/2023 0926   Nutritional Lab Results  Component Value Date   VD25OH 69 04/20/2024   VD25OH 38 10/21/2023   VD25OH 33.96 04/23/2023    Attestations:   LILLETTE Sonny Laroche, acting as a medical scribe for Sarah Jenkins, DO., have compiled all relevant documentation for today's office visit on behalf of Sarah Jenkins, DO, while in the presence of [employer], DO.   I have reviewed the above documentation for accuracy and completeness, and I agree with the above. Sarah JINNY [name], D.O.  The 21st Century Cures Act was signed into law in 2016 which includes the topic of electronic health records.  This provides immediate access to information in MyChart.  This includes consultation notes, operative notes, office notes, lab results and pathology reports.  If you have any questions about what you read please let us  know at your next visit so we can discuss your concerns and take corrective action if need be.  We are right here with you.

## 2024-04-28 ENCOUNTER — Other Ambulatory Visit: Payer: Self-pay | Admitting: Emergency Medicine

## 2024-04-29 ENCOUNTER — Encounter: Payer: Self-pay | Admitting: Podiatry

## 2024-04-29 ENCOUNTER — Ambulatory Visit (INDEPENDENT_AMBULATORY_CARE_PROVIDER_SITE_OTHER): Admitting: Podiatry

## 2024-04-29 DIAGNOSIS — B351 Tinea unguium: Secondary | ICD-10-CM | POA: Diagnosis not present

## 2024-04-29 DIAGNOSIS — M79675 Pain in left toe(s): Secondary | ICD-10-CM | POA: Diagnosis not present

## 2024-04-29 DIAGNOSIS — M79674 Pain in right toe(s): Secondary | ICD-10-CM | POA: Diagnosis not present

## 2024-04-29 DIAGNOSIS — G622 Polyneuropathy due to other toxic agents: Secondary | ICD-10-CM

## 2024-04-29 DIAGNOSIS — E119 Type 2 diabetes mellitus without complications: Secondary | ICD-10-CM | POA: Diagnosis not present

## 2024-04-29 NOTE — Progress Notes (Signed)
 This patient returns to my office for at risk foot care.  This patient requires this care by a professional since this patient will be at risk due to having diabetes and neuropathy. This patient is unable to cut nails herself since the patient cannot reach her nails.These nails are painful walking and wearing shoes.  This patient presents for at risk foot care today.  General Appearance  Alert, conversant and in no acute stress.  Vascular  Dorsalis pedis and posterior tibial  pulses are palpable  bilaterally.  Capillary return is within normal limits  bilaterally. Temperature is within normal limits  bilaterally.  Neurologic  Senn-Weinstein monofilament wire test diminished  bilaterally. Muscle power within normal limits bilaterally.  Nails Thick disfigured discolored nails with subungual debris  from hallux to fifth toes bilaterally. No evidence of bacterial infection or drainage bilaterally.  Orthopedic  No limitations of motion  feet .  No crepitus or effusions noted.  HAV  B/L.  Skin  normotropic skin with no porokeratosis noted bilaterally.  No signs of infections or ulcers noted.     Onychomycosis  Pain in right toes  Pain in left toes  Consent was obtained for treatment procedures.   Mechanical debridement of nails 1-5  bilaterally performed with a nail nipper.  Filed with dremel without incident.    Return office visit    3 months                  Told patient to return for periodic foot care and evaluation due to potential at risk complications.   Cordella Bold DPM

## 2024-05-04 ENCOUNTER — Other Ambulatory Visit: Payer: Self-pay | Admitting: Internal Medicine

## 2024-05-04 ENCOUNTER — Encounter: Payer: Self-pay | Admitting: Internal Medicine

## 2024-05-04 DIAGNOSIS — G620 Drug-induced polyneuropathy: Secondary | ICD-10-CM

## 2024-05-09 ENCOUNTER — Other Ambulatory Visit (INDEPENDENT_AMBULATORY_CARE_PROVIDER_SITE_OTHER): Payer: Self-pay | Admitting: Family Medicine

## 2024-05-09 ENCOUNTER — Other Ambulatory Visit: Payer: Self-pay | Admitting: Internal Medicine

## 2024-05-09 DIAGNOSIS — I10 Essential (primary) hypertension: Secondary | ICD-10-CM

## 2024-05-09 DIAGNOSIS — Z9109 Other allergy status, other than to drugs and biological substances: Secondary | ICD-10-CM

## 2024-05-10 ENCOUNTER — Other Ambulatory Visit (HOSPITAL_COMMUNITY)

## 2024-05-10 ENCOUNTER — Encounter (HOSPITAL_COMMUNITY): Admission: RE | Admit: 2024-05-10 | Source: Ambulatory Visit

## 2024-05-18 ENCOUNTER — Ambulatory Visit (INDEPENDENT_AMBULATORY_CARE_PROVIDER_SITE_OTHER): Admitting: Family Medicine

## 2024-05-18 ENCOUNTER — Encounter: Payer: Self-pay | Admitting: Internal Medicine

## 2024-05-18 ENCOUNTER — Encounter (INDEPENDENT_AMBULATORY_CARE_PROVIDER_SITE_OTHER): Payer: Self-pay | Admitting: Family Medicine

## 2024-05-18 ENCOUNTER — Ambulatory Visit (INDEPENDENT_AMBULATORY_CARE_PROVIDER_SITE_OTHER): Payer: Self-pay | Admitting: Family Medicine

## 2024-05-18 ENCOUNTER — Other Ambulatory Visit: Payer: Self-pay | Admitting: Internal Medicine

## 2024-05-18 VITALS — BP 123/80 | HR 82 | Temp 97.9°F | Ht 64.0 in | Wt 172.0 lb

## 2024-05-18 DIAGNOSIS — E66811 Obesity, class 1: Secondary | ICD-10-CM

## 2024-05-18 DIAGNOSIS — Z9109 Other allergy status, other than to drugs and biological substances: Secondary | ICD-10-CM

## 2024-05-18 DIAGNOSIS — Z7985 Long-term (current) use of injectable non-insulin antidiabetic drugs: Secondary | ICD-10-CM | POA: Diagnosis not present

## 2024-05-18 DIAGNOSIS — I152 Hypertension secondary to endocrine disorders: Secondary | ICD-10-CM

## 2024-05-18 DIAGNOSIS — E1159 Type 2 diabetes mellitus with other circulatory complications: Secondary | ICD-10-CM | POA: Diagnosis not present

## 2024-05-18 DIAGNOSIS — J302 Other seasonal allergic rhinitis: Secondary | ICD-10-CM | POA: Diagnosis not present

## 2024-05-18 DIAGNOSIS — E669 Obesity, unspecified: Secondary | ICD-10-CM | POA: Diagnosis not present

## 2024-05-18 DIAGNOSIS — Z6829 Body mass index (BMI) 29.0-29.9, adult: Secondary | ICD-10-CM | POA: Diagnosis not present

## 2024-05-18 MED ORDER — FREESTYLE LIBRE 3 SENSOR MISC: 5 refills | Status: AC

## 2024-05-18 MED ORDER — MONTELUKAST SODIUM 10 MG PO TABS: 10.0000 mg | ORAL_TABLET | Freq: Every day | ORAL | 0 refills | Status: AC

## 2024-05-18 NOTE — Progress Notes (Signed)
 Sarah [name], D.O.  ABFM, ABOM Specializing in Clinical Bariatric Medicine  Office located at: [address] [place], [place]  [address]      A) FOR THE CHRONIC DISEASE OF OBESITY:  Chief complaint: Obesity Sarah [name] is here to discuss her progress with her obesity treatment plan.   History of present illness / Interval history:  Sarah [name] is here today for her follow-up office visit.  Since last OV on 04/27/24, pt is the same weight. Patient states that she has not been feeling well the last couple of weeks physically. She has been feeling queasy and nauseated right after her medications.    04/27/24 10:00 05/18/24 10:00   Body Fat % 37.4 % 37.7 %  Muscle Mass (lbs) 102.8 lbs 101.8 lbs  Fat Mass (lbs) 64.6 lbs 64.8 lbs  Total Body Water  (lbs) 67.2 lbs 65.2 lbs  Visceral Fat Rating  9 9    Counseling done on how various foods will affect these numbers and how to maximize success.  Total lbs lost to date: - 56 lbs Total Fat Mass in lbs lost to date: - 32.8 lbs Total weight loss percentage to date: - 24.56 %    Obesity with starting BMI 37.9/date 02/15/2020 Obesity (BMI 30.0-34.9) current BMI 29.51  Nutrition Therapy She is journaling 1200-1300 cal and 85++ g protein with the Category 2 MP as a guide  and states she is following her eating plan approximately 40 % of the time.   - Tracking Calories/Macros: yes  - Eating More Whole Foods: yes  - Adequate Protein Intake: no   - Adequate Water  Intake: yes  - Skipping Meals: yes  - Sleeping 7-9 Hours/ Night: no    Sarah [name] is currently in the action stage of change. As such, her goal is to continue weight management plan.  She has agreed to: continue current plan   Physical Activity Sarah [name] is doing silver sneakers 50  minutes 3 days per week   Sarah [name] has been advised to work up to 300-450 minutes of moderate intensity aerobic activity a week and strengthening exercises 2-3 times per week for  cardiovascular health, weight loss maintenance and preservation of muscle mass.  She has agreed to : Increase volume of physical activity to a goal of 240 minutes a week   Behavioral Modifications Evidence-based interventions for health behavior change were utilized today including the discussion of  1) self monitoring techniques:    - Continue weighing and measuring foods   2) SMART goals for next OV:    - Meal prep on Sundays  - Add another exercise   Regarding patient's less desirable eating habits and patterns, we employed the technique of small changes.   We discussed the following today: increasing lean protein intake to established goals, avoiding skipping meals, increasing water  intake , and work on meal planning and preparation Additional resources provided today: None   Medical Interventions/ Pharmacotherapy Previous Bariatric surgery: n/a Pharmacotherapy for weight loss: She is currently taking Wellbutrin  SR 200 mg BID, Metformin  500 mg daily and Mounjaro  15 mg weekly for medical weight loss.    We discussed various medication options to help Sarah [name] with her weight loss efforts and we both agreed to : Adequate clinical response to anti-obesity medication, continue current regimen   B) OBESITY RELATED CONDITIONS ADDRESSED TODAY:  Type 2 diabetes mellitus in patient with obesity [facility]) Assessment & Plan Lab Results  Component Value Date   HGBA1C 5.6 01/22/2024  HGBA1C 6.0 (H) 07/23/2023   HGBA1C 6.4 04/23/2023    On Metformin  500 mg daily and Mounjaro  15 mg weekly. With reported good compliance and tolerance. Patient states that she still has not started her 12.5 mg Mounjaro  due to her still having the 15 mg Mounjaro  left over. She states that she has felt increased nausea. Recommended that patient Decrease to 12.5 mg Mounjaro  weekly to help decrease nausea and help avoid skipping meals. Patient states that she has been checking her sugar levels at home and they  have Skipping more meals than usual   2 80-90 after 2 hours biggest meals  Morning 83  Hihgest is 123 ever    Hypertension associated with type 2 diabetes mellitus [facility]) Assessment & Plan BP Readings from Last 3 Encounters:  05/18/24 123/80  04/27/24 135/81  04/20/24 136/84   The 10-year ASCVD risk score (Arnett DK, et al., 2019) is: 11.3%  Lab Results  Component Value Date   CREATININE 1.10 (H) 10/21/2023   On Apresoline  50 mg three times daily, Cozaar  50 mg daily and Norvasc  10 mg daily. With reported good compliance and tolerance. BP today is at goal - well controlled.      Environmental allergies Assessment & Plan Singular rfill  Allergies coughing a lot  Using a humidifier at night can help due to heater usage now in winter      Medications Discontinued During This Encounter  Medication Reason   montelukast  (SINGULAIR ) 10 MG tablet Reorder     Meds ordered this encounter  Medications   montelukast  (SINGULAIR ) 10 MG tablet    Sig: Take 1 tablet (10 mg total) by mouth at bedtime.    Dispense:  90 tablet    Refill:  0      Follow up:   Return 06/14/2024 at 7:40 AM  She was informed of the importance of frequent follow up visits to maximize her success with intensive lifestyle modifications for her multiple health conditions.   Weight Summary and Biometrics   Weight Lost Since Last Visit: 0lb  Weight Gained Since Last Visit: 0lb   Vitals Temp: 97.9 F (36.6 C) BP: 123/80 Pulse Rate: 82 SpO2: 99 %   Anthropometric Measurements Height: 5' 4 (1.626 m) Weight: 172 lb (78 kg) BMI (Calculated): 29.51 Weight at Last Visit: 172lb Weight Lost Since Last Visit: 0lb Weight Gained Since Last Visit: 0lb Starting Weight: 228lb Total Weight Loss (lbs): 56 lb (25.4 kg) Peak Weight: 228lb   Body Composition  Body Fat %: 37.7 % Fat Mass (lbs): 64.8 lbs Muscle Mass (lbs): 101.8 lbs Total Body Water  (lbs): 65.2 lbs Visceral Fat Rating : 9   Other  Clinical Data Fasting: no Labs: no Today's Visit #: 93 Starting Date: 02/15/20    Objective:   PHYSICAL EXAM: Blood pressure 123/80, pulse 82, temperature 97.9 F (36.6 C), height 5' 4 (1.626 m), weight 172 lb (78 kg), SpO2 99%. Body mass index is 29.52 kg/m.  General: she is overweight, cooperative and in no acute distress. PSYCH: Has normal mood, affect and thought process.   HEENT: EOMI, sclerae are anicteric. Lungs: Normal breathing effort, no conversational dyspnea. Extremities: Moves * 4 Neurologic: A and O * 3, good insight  DIAGNOSTIC DATA REVIEWED: BMET    Component Value Date/Time   NA 139 10/21/2023 0926   NA 143 07/23/2023 0925   NA 138 04/22/2016 0750   K 4.1 10/21/2023 0926   K 3.9 04/22/2016 0750   CL 104 10/21/2023 0926  CO2 26 10/21/2023 0926   CO2 26 04/22/2016 0750   GLUCOSE 102 (H) 10/21/2023 0926   GLUCOSE 142 (H) 04/22/2016 0750   BUN 17 10/21/2023 0926   BUN 19 07/23/2023 0925   BUN 19.1 11/20/2016 1355   CREATININE 1.10 (H) 10/21/2023 0926   CREATININE 1.1 11/20/2016 1355   CALCIUM  12.3 (H) 10/21/2023 0926   CALCIUM  9.8 04/22/2016 0750   GFRNONAA 47 (L) 03/28/2022 1508   GFRNONAA 48 (L) 12/29/2019 1015   GFRNONAA 63 07/08/2014 1212   GFRAA 69 04/17/2020 0928   GFRAA 56 (L) 12/29/2019 1015   GFRAA 73 07/08/2014 1212   Lab Results  Component Value Date   HGBA1C 5.6 01/22/2024   HGBA1C 10.1 (H) 07/07/2015   No results found for: INSULIN  Lab Results  Component Value Date   TSH 1.16 10/21/2023   CBC    Component Value Date/Time   WBC 5.4 03/28/2022 1508   RBC 4.97 03/28/2022 1508   HGB 15.0 03/28/2022 1508   HGB 13.3 02/03/2020 0837   HGB 13.6 12/04/2018 0914   HGB 12.9 04/22/2016 0750   HCT 46.8 (H) 03/28/2022 1508   HCT 41.0 12/04/2018 0914   HCT 39.0 04/22/2016 0750   PLT 318 03/28/2022 1508   PLT 270 02/03/2020 0837   PLT 332 12/04/2018 0914   MCV 94.2 03/28/2022 1508   MCV 88 12/04/2018 0914   MCV 92.2  04/22/2016 0750   MCH 30.2 03/28/2022 1508   MCHC 32.1 03/28/2022 1508   RDW 13.6 03/28/2022 1508   RDW 13.6 12/04/2018 0914   RDW 13.9 04/22/2016 0750   Iron Studies    Component Value Date/Time   IRON 43 08/18/2010 0515   TIBC 389 08/18/2010 0515   FERRITIN 227 08/18/2010 0515   IRONPCTSAT 11 (L) 08/18/2010 0515   Lipid Panel     Component Value Date/Time   CHOL 161 07/23/2023 0925   TRIG 162 (H) 07/23/2023 0925   HDL 47 07/23/2023 0925   CHOLHDL 3.4 07/23/2023 0925   CHOLHDL 5.1 (H) 08/14/2016 1729   VLDL 74 (H) 08/14/2016 1729   LDLCALC 86 07/23/2023 0925   Hepatic Function Panel     Component Value Date/Time   PROT 7.9 10/21/2023 0926   PROT 7.3 07/23/2023 0925   PROT 8.2 04/22/2016 0750   ALBUMIN 4.6 07/23/2023 0925   ALBUMIN 3.8 04/22/2016 0750   AST 26 10/21/2023 0926   AST 29 12/29/2019 1015   AST 27 04/22/2016 0750   ALT 24 10/21/2023 0926   ALT 33 12/29/2019 1015   ALT 24 04/22/2016 0750   ALKPHOS 82 07/23/2023 0925   ALKPHOS 74 04/22/2016 0750   BILITOT 0.6 10/21/2023 0926   BILITOT 0.5 07/23/2023 0925   BILITOT 0.6 12/29/2019 1015   BILITOT 0.44 04/22/2016 0750   BILIDIR 0.2 08/18/2010 0515   IBILI 1.5 (H) 08/18/2010 0515      Component Value Date/Time   TSH 1.16 10/21/2023 0926   Nutritional Lab Results  Component Value Date   VD25OH 69 04/20/2024   VD25OH 38 10/21/2023   VD25OH 33.96 04/23/2023    Attestations:   I, ***, acting as a medical scribe for Sarah Jenkins, DO., have compiled all relevant documentation for today's office visit on behalf of Sarah Jenkins, DO, while in the presence of [employer], DO.  I have spent *** minutes in the care of the patient today including *** minutes face-to-face assessing and reviewing listed medical problems above as outlined in office visit  note and providing nutritional and behavioral counseling as outlined in obesity care plan.   I have reviewed the above documentation for accuracy and  completeness, and I agree with the above. Sarah Sarah [name], D.O.  The 21st Century Cures Act was signed into law in 2016 which includes the topic of electronic health records.  This provides immediate access to information in MyChart.  This includes consultation notes, operative notes, office notes, lab results and pathology reports.  If you have any questions about what you read please let us  know at your next visit so we can discuss your concerns and take corrective action if need be.  We are right here with you.

## 2024-05-19 ENCOUNTER — Encounter: Payer: Self-pay | Admitting: Internal Medicine

## 2024-05-27 ENCOUNTER — Other Ambulatory Visit (INDEPENDENT_AMBULATORY_CARE_PROVIDER_SITE_OTHER): Payer: Self-pay | Admitting: Family Medicine

## 2024-05-27 ENCOUNTER — Ambulatory Visit: Attending: Internal Medicine | Admitting: Internal Medicine

## 2024-05-27 ENCOUNTER — Encounter: Payer: Self-pay | Admitting: Internal Medicine

## 2024-05-27 VITALS — BP 120/76 | HR 86 | Temp 97.6°F | Ht 64.0 in | Wt 176.0 lb

## 2024-05-27 DIAGNOSIS — N1831 Chronic kidney disease, stage 3a: Secondary | ICD-10-CM | POA: Diagnosis not present

## 2024-05-27 DIAGNOSIS — I10 Essential (primary) hypertension: Secondary | ICD-10-CM

## 2024-05-27 DIAGNOSIS — E785 Hyperlipidemia, unspecified: Secondary | ICD-10-CM

## 2024-05-27 DIAGNOSIS — E1159 Type 2 diabetes mellitus with other circulatory complications: Secondary | ICD-10-CM | POA: Diagnosis not present

## 2024-05-27 DIAGNOSIS — E1122 Type 2 diabetes mellitus with diabetic chronic kidney disease: Secondary | ICD-10-CM | POA: Diagnosis not present

## 2024-05-27 DIAGNOSIS — Z7985 Long-term (current) use of injectable non-insulin antidiabetic drugs: Secondary | ICD-10-CM | POA: Diagnosis not present

## 2024-05-27 DIAGNOSIS — Z23 Encounter for immunization: Secondary | ICD-10-CM

## 2024-05-27 DIAGNOSIS — F39 Unspecified mood [affective] disorder: Secondary | ICD-10-CM

## 2024-05-27 DIAGNOSIS — E1169 Type 2 diabetes mellitus with other specified complication: Secondary | ICD-10-CM

## 2024-05-27 DIAGNOSIS — E669 Obesity, unspecified: Secondary | ICD-10-CM

## 2024-05-27 DIAGNOSIS — I152 Hypertension secondary to endocrine disorders: Secondary | ICD-10-CM

## 2024-05-27 LAB — POCT GLYCOSYLATED HEMOGLOBIN (HGB A1C): HbA1c, POC (controlled diabetic range): 5.5 % (ref 0.0–7.0)

## 2024-05-27 LAB — GLUCOSE, POCT (MANUAL RESULT ENTRY): POC Glucose: 83 mg/dL (ref 70–99)

## 2024-05-27 NOTE — Patient Instructions (Signed)
°  VISIT SUMMARY: [place], during your follow-up visit, we reviewed your diabetes, hypertension, hyperlipidemia, and other health concerns. Your diabetes management is excellent with an A1c of 5.5% and fasting blood sugar of 83 mg/dL. Your blood pressure is well-controlled at 120/76 mmHg. We discussed your stable lung nodule, high calcium  levels, and weight loss progress. We also reviewed your medications and made some adjustments.  YOUR PLAN: -TYPE 2 DIABETES MELLITUS: Your diabetes is well-controlled with an A1c of 5.5% and fasting blood sugar of 83 mg/dL. We have discontinued metformin  due to your excellent glycemic control. Continue taking Mounjaro  12 mg once weekly. Please ensure you eat three meals a day with adequate protein and continue your weight-bearing exercises several days a week.  -HYPERTENSION: Your blood pressure is well-controlled at 120/76 mmHg. Continue taking amlodipine  10 mg daily, hydralazine  50 mg three times a day, and Cozaar  50 mg daily. Monitor your blood pressure at least once a week and limit your salt intake.  -HYPERLIPIDEMIA: Your cholesterol is managed with rosuvastatin  20 mg daily. Continue taking this medication as prescribed.  -CHRONIC KIDNEY DISEASE STAGE 3A: This condition involves reduced kidney function. We have ordered kidney function tests to monitor your condition.  -HYPERCALCEMIA, SUSPECTED PRIMARY HYPERPARATHYROIDISM: This condition involves high calcium  levels, possibly due to an overactive parathyroid  gland. We have ordered a parathyroid  scan and discussed a potential referral to [facility] for a second opinion if the scan is pursued.  -PULMONARY NODULE, STABLE: You have a stable 1 cm nodule in your lung that has not changed since 2021, indicating it is likely benign. Continue with a follow-up CT scan in August 2026.  -OBESITY: Your weight has decreased from 186 lbs to 176 lbs, and your BMI is now 30. Continue your weight loss efforts by eating three meals a day  with protein and engaging in weight-bearing exercises several days a week.  INSTRUCTIONS: Please follow up with the ordered kidney function tests and parathyroid  scan. Continue monitoring your blood pressure weekly. Schedule your next CT scan for August 2026. Consider a referral to [facility] for a second opinion on your hypercalcemia if the parathyroid  scan is pursued.                      Contains text generated by [employer].                                 Contains text generated by [employer].

## 2024-05-27 NOTE — Progress Notes (Signed)
 Patient ID: Sarah [name], female    DOB: 11-29-1965  MRN: [id number]  CC: Diabetes (DM f/u./Discuss metformin  - long term side effects/Yes to flu vax)   Subjective: [name] is a 58 y.o. female who presents for chronic ds management. 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 (S/p adjuvant chemotherapy completed on 12/08/14. S/p ex lap, hernia repair and small bowel resection for a presumed recurrence (benign pathology), Hypercalcemia with elevated free light chains (BM bx Dr. Lanny negative, no need for further f/u), dx with [facility] by Dr. Kassie, 1.1 cm RLL nodule (bx negative 01/2020), RT thyroid  nodule (does not met criteria for bx 10/2019),         Discussed the use of AI scribe software for clinical note transcription with the patient, who gave verbal consent to proceed.  History of Present Illness Sarah [name] is a 58 year old female with diabetes, hypertension, and hyperlipidemia who presents for a four-month follow-up visit.  DM:  Results for orders placed or performed in visit on 05/27/24  POCT glucose (manual entry)   Collection Time: 05/27/24 10:25 AM  Result Value Ref Range   POC Glucose 83 70 - 99 mg/dl  POCT glycosylated hemoglobin (Hb A1C)   Collection Time: 05/27/24 10:29 AM  Result Value Ref Range   Hemoglobin A1C     HbA1c POC (<> result, manual entry)     HbA1c, POC (prediabetic range)     HbA1c, POC (controlled diabetic range) 5.5 0.0 - 7.0 %   *Note: Due to a large number of results and/or encounters for the requested time period, some results have not been displayed. A complete set of results can be found in Results Review.  Her diabetes management is progressing well with an A1c of 5.5% and a fasting blood sugar of 83 mg/dL. She is on Mounjaro  12 mg weekly, reduced from 15 mg by Dr.  Midge due to severe appetite suppression to where she was not eating.Continues to take metformin  500 mg daily. She attempts to eat three meals a day now, though sometimes skips one meal, and engages in weight-bearing exercises three times a week through [employer]. Her weight has decreased from 186 lbs to 176 lbs since last visit with me 4 mths ago, with a BMI of 30. She uses a continuous glucose monitor and experiences no significant hypoglycemic episodes. Forgot to bring her reader.  HTN: Her blood pressure is well-controlled at 120/76 mmHg. She takes amlodipine  10 mg daily, hydralazine  50 mg three times a day, and losartan  50 mg daily. She checks her blood pressure approximately once a month and reports occasional chest pain and shortness of breath, particularly when singing at church which improves with use of inhaler. We have been keeping an eye on her kidney function.  GFR has ranged from 46-59.  She is not on NSAIDs.  Lung Nodule: She has a stable 1 cm nodule in the right perihilar region of her lung, last evaluated with a CT scan in August 2025, showing no growth since may 2021. Most likely benign; plan to follow for total 5 yrs. Saw Dr. Shelah earlier this yr  She has been evaluated for high calcium  levels by an endocrinologist. Previous tests, including parathyroid  hormone, vitamin D , and phosphorus levels, were normal. A workup for multiple myeloma was negative  by Dr. Lanny. Dr. Trixie thinks she likely has hyperPTH not familial hypercalcemia.  She wanted to get an imaging study of the parathyroid  glands but patient's co-pay is 300+ dollars which she cannot afford at this time.    Her cholesterol is managed with rosuvastatin  20 mg daily, and her last cholesterol check was in February 2025.    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 02/22/2023   Chronic cough 02/05/2023   Gastroesophageal reflux disease 08/28/2022   Opioid use agreement exists 07/21/2022   Degenerative disc disease, thoracic 07/21/2022   Degenerative disc disease, lumbar 07/21/2022   Essential hypertension 07/10/2022   Multiple thyroid  nodules 06/21/2022   Type 2 diabetes mellitus with obesity 05/28/2022   Environmental allergies 05/28/2022   Class 2 severe obesity with serious comorbidity and body mass index (BMI) of 37.0 to 37.9 in adult 05/28/2022   H/O gastroesophageal reflux (GERD) 05/28/2022   Hypomagnesemia 02/04/2022   Mixed stress and urge urinary incontinence 10/04/2021   S/P hernia repair 07/03/2021   Atherosclerosis of aorta 05/28/2021   Mood disorder (HCC) with emotional eating 05/21/2021   Cutaneous abscess of back excluding buttocks 01/12/2021   Depressed mood, with emotional eating 10/23/2020   Seasonal allergies 09/05/2020   Class 2 severe obesity with serious comorbidity and body mass index (BMI) of 36.0 to 36.9 in adult 07/24/2020   At risk for impaired metabolic function 06/12/2020   Hypertension associated with type 2 diabetes mellitus (HCC) 05/15/2020   Stage 3a chronic kidney disease (CKD) (HCC) 05/01/2020   Type 2 diabetes mellitus with chronic kidney disease, with long-term current use of insulin  (HCC) 04/17/2020   At risk for hypoglycemia 04/17/2020   B12 deficiency 02/29/2020   Diabetes mellitus (HCC) 02/15/2020   Vitamin D  deficiency 02/15/2020   Other insomnia 01/24/2020   Morbid obesity (HCC) 01/24/2020   Elevated serum immunoglobulin free light chains 01/21/2020   Thyroid  nodule 10/14/2019   Renal insufficiency 06/24/2019   Hypercalcemia 06/24/2019   Hiatal hernia with GERD without esophagitis 06/18/2017   Need for influenza vaccination 02/17/2017   Ventral hernia 01/24/2016   Type 2 diabetes mellitus without complication, without long-term current use of insulin  (HCC) 10/04/2015    Generalized obesity 10/04/2015   Hyperlipidemia associated with type 2 diabetes mellitus (HCC) 07/27/2015   Neuropathic pain of both legs 07/27/2015   Obesity (BMI 30.0-34.9) 12/24/2014   Genetic testing 11/01/2014   Functional constipation 09/24/2014   Chemotherapy-induced peripheral neuropathy 09/24/2014   Midline low back pain without sciatica 09/24/2014   Family history of colon cancer    Hilar adenopathy    Hypertension associated with diabetes (HCC)    Pulmonary nodule 1 cm or greater in diameter      Medications Ordered Prior to Encounter[1]  Allergies[2]  Social History   Socioeconomic History   Marital status: Married    Spouse name: Not on file   Number of children: 2   Years of education: Not on file   Highest education level: 12th grade  Occupational History   Occupation: disability  Tobacco Use   Smoking status: Never   Smokeless tobacco: Never  Vaping Use   Vaping status: Never Used  Substance and Sexual Activity   Alcohol use: No   Drug use: No   Sexual activity: Not Currently  Birth control/protection: None  Other Topics Concern   Not on file  Social History Narrative   No issues with transportation.    Attends church   Social Drivers of Health   Tobacco Use: Low Risk (05/18/2024)   Patient History    Smoking Tobacco Use: Never    Smokeless Tobacco Use: Never    Passive Exposure: Not on file  Financial Resource Strain: Medium Risk (05/24/2024)   Overall Financial Resource Strain (CARDIA)    Difficulty of Paying Living Expenses: Somewhat hard  Food Insecurity: Food Insecurity Present (05/24/2024)   Epic    Worried About Programme Researcher, Broadcasting/film/video in the Last Year: Sometimes true    Ran Out of Food in the Last Year: Sometimes true  Transportation Needs: No Transportation Needs (05/24/2024)   Epic    Lack of Transportation (Medical): No    Lack of Transportation (Non-Medical): No  Physical Activity: Sufficiently Active (05/24/2024)   Exercise  Vital Sign    Days of Exercise per Week: 3 days    Minutes of Exercise per Session: 50 min  Stress: Stress Concern Present (05/24/2024)   [employer] of Occupational Health - Occupational Stress Questionnaire    Feeling of Stress: To some extent  Social Connections: Moderately Integrated (05/24/2024)   Social Connection and Isolation Panel    Frequency of Communication with Friends and Family: More than three times a week    Frequency of Social Gatherings with Friends and Family: Twice a week    Attends Religious Services: More than 4 times per year    Active Member of Clubs or Organizations: Yes    Attends Banker Meetings: More than 4 times per year    Marital Status: Separated  Intimate Partner Violence: Not At Risk (01/22/2024)   Epic    Fear of Current or Ex-Partner: No    Emotionally Abused: No    Physically Abused: No    Sexually Abused: No  Depression (PHQ2-9): Low Risk (01/22/2024)   Depression (PHQ2-9)    PHQ-2 Score: 3  Alcohol Screen: Low Risk (01/18/2024)   Alcohol Screen    Last Alcohol Screening Score (AUDIT): 0  Housing: High Risk (05/24/2024)   Epic    Unable to Pay for Housing in the Last Year: Yes    Number of Times Moved in the Last Year: 0    Homeless in the Last Year: No  Utilities: Not At Risk (01/22/2024)   Epic    Threatened with loss of utilities: No  Health Literacy: Adequate Health Literacy (01/22/2024)   B1300 Health Literacy    Frequency of need for help with medical instructions: Never    Family History  Problem Relation Age of Onset   Hypertension Mother    Hyperlipidemia Mother    Stroke Mother    Thyroid  disease Mother    Arthritis Mother    Hearing loss Mother    Hypertension Father    Diabetes Father    Hyperlipidemia Father    Sudden death Father    Arthritis Father    Heart disease Father    Obesity Father    Cancer Sister 9       fibrosarcoma (back); currently 39   Cancer Brother    Prostate cancer Maternal  Uncle    Colon cancer Paternal Aunt        Dx 93s; deceased 44s   Prostate cancer Paternal Uncle        currently 40   Cancer Paternal Uncle 4  bone ; unk. primary   Stomach cancer Paternal Uncle    Hypercalcemia Neg Hx     Past Surgical History:  Procedure Laterality Date   ABDOMINAL HYSTERECTOMY     CATARACT EXTRACTION     RT eye 11/28/22 and LT eye 12/19/2022   FOOT SURGERY  04/2018   [facility]   HERNIA REPAIR     INCISIONAL HERNIA REPAIR N/A 07/06/2015   Procedure: INCISIONAL HERNIA REPAIR ;  Surgeon: Donnice Bury, MD;  Location: [facility];  Service: General;  Laterality: N/A;   INCISIONAL HERNIA REPAIR N/A 07/03/2021   Procedure: SHIRLENE HERNIA REPAIR WITH MESH;  Surgeon: Rubin Calamity, MD;  Location[facility];  Service: General;  Laterality: N/A;   INSERTION OF MESH N/A 07/06/2015   Procedure: WITH INSERTION OF PHASIX ST MESH;  Surgeon: Donnice Bury, MD;  Location: [facility];  Service: General;  Laterality: N/A;   LAPAROTOMY N/A 07/06/2015   Procedure: EXPLORATORY LAPAROTOMY;  Surgeon: Maurilio Ship, MD;  Location: [facility];  Service: Gynecology;  Laterality: N/A;   LYSIS OF ADHESION N/A 07/06/2015   Procedure:  LYSIS OF ADHESION RESECTION OF MESENTERIC MASS BOWEL RESECTION ;  Surgeon: Maurilio Ship, MD;  Location: [facility];  Service: Gynecology;  Laterality: N/A;   LYSIS OF ADHESION N/A 07/03/2021   Procedure: EXPLORATORY LAPAROTOMY WITH LYSIS OF ADHESION;  Surgeon: Rubin Calamity, MD;  Location: [facility] OR;  Service: General;  Laterality: N/A;   ROBOTIC ASSISTED TOTAL HYSTERECTOMY WITH BILATERAL SALPINGO OOPHERECTOMY Bilateral 07/28/2014   Procedure: ROBOTIC ASSISTED lysis of adhesions with biopsies, converted to LAPAROTOMY, bilateral salpingoorphorectomy, omentectomy,appendectomy;  Surgeon: Sari Bachelor, MD;  Location: [facility];  Service: Gynecology;  Laterality: Bilateral;   THYROIDECTOMY, PARTIAL     VIDEO BRONCHOSCOPY N/A 01/13/2020   Procedure: VIDEO BRONCHOSCOPY  WITH BIOPSIES;  Surgeon: Kerrin Elspeth BROCKS, MD;  Location: [facility] OR;  Service: Thoracic;  Laterality: N/A;    ROS: Review of Systems Negative except as stated above  PHYSICAL EXAM: BP 120/76 (BP Location: Left Arm, Patient Position: Sitting, Cuff Size: Normal)   Pulse 86   Temp 97.6 F (36.4 C) (Oral)   Ht 5' 4 (1.626 m)   Wt 176 lb (79.8 kg)   LMP  (LMP Unknown)   SpO2 100%   BMI 30.21 kg/m   Wt Readings from Last 3 Encounters:  05/27/24 176 lb (79.8 kg)  05/18/24 172 lb (78 kg)  04/27/24 172 lb (78 kg)    Physical Exam  General appearance - alert, well appearing, older AAF and in no distress Mental status - normal mood, behavior, speech, dress, motor activity, and thought processes Neck - supple, no significant adenopathy Chest - clear to auscultation, no wheezes, rales or rhonchi, symmetric air entry Heart - normal rate, regular rhythm, normal S1, S2, no murmurs, rubs, clicks or gallops Extremities - peripheral pulses normal, no pedal edema, no clubbing or cyanosis      Latest Ref Rng & Units 10/21/2023    9:26 AM 07/23/2023    9:25 AM 05/22/2023   11:40 AM  CMP  Glucose 65 - 99 mg/dL 897  88  92   BUN 7 - 25 mg/dL 17  19  18    Creatinine 0.50 - 1.03 mg/dL 8.89  8.64  8.73   Sodium 135 - 146 mmol/L 139  143  141   Potassium 3.5 - 5.3 mmol/L 4.1  4.4  4.7   Chloride 98 - 110 mmol/L 104  106  103   CO2 20 -  32 mmol/L 26  23  21    Calcium  8.6 - 10.4 mg/dL 87.6  88.5  88.2   Total Protein 6.1 - 8.1 g/dL 7.9  7.3    Total Bilirubin 0.2 - 1.2 mg/dL 0.6  0.5    Alkaline Phos 44 - 121 IU/L  82    AST 10 - 35 U/L 26  30    ALT 6 - 29 U/L 24  30     Lipid Panel     Component Value Date/Time   CHOL 161 07/23/2023 0925   TRIG 162 (H) 07/23/2023 0925   HDL 47 07/23/2023 0925   CHOLHDL 3.4 07/23/2023 0925   CHOLHDL 5.1 (H) 08/14/2016 1729   VLDL 74 (H) 08/14/2016 1729   LDLCALC 86 07/23/2023 0925    CBC    Component Value Date/Time   WBC 5.4 03/28/2022 1508    RBC 4.97 03/28/2022 1508   HGB 15.0 03/28/2022 1508   HGB 13.3 02/03/2020 0837   HGB 13.6 12/04/2018 0914   HGB 12.9 04/22/2016 0750   HCT 46.8 (H) 03/28/2022 1508   HCT 41.0 12/04/2018 0914   HCT 39.0 04/22/2016 0750   PLT 318 03/28/2022 1508   PLT 270 02/03/2020 0837   PLT 332 12/04/2018 0914   MCV 94.2 03/28/2022 1508   MCV 88 12/04/2018 0914   MCV 92.2 04/22/2016 0750   MCH 30.2 03/28/2022 1508   MCHC 32.1 03/28/2022 1508   RDW 13.6 03/28/2022 1508   RDW 13.6 12/04/2018 0914   RDW 13.9 04/22/2016 0750   LYMPHSABS 2.4 03/28/2022 1508   LYMPHSABS 2.7 04/22/2016 0750   MONOABS 0.8 03/28/2022 1508   MONOABS 1.0 (H) 04/22/2016 0750   EOSABS 0.1 03/28/2022 1508   EOSABS 0.1 04/22/2016 0750   BASOSABS 0.0 03/28/2022 1508   BASOSABS 0.0 04/22/2016 0750    ASSESSMENT AND PLAN: 1. Type 2 diabetes mellitus in patient with obesity (HCC) (Primary) At goal.  She has achieved significant weight loss with Mounjaro .  Advised of the importance of still trying to eat 3 meals a day and getting in some protein with each meal.  This along with continued weightbearing exercises will help to prevent excessive loss of muscle mass.  Stop metformin . - POCT glucose (manual entry) - POCT glycosylated hemoglobin (Hb A1C) - Microalbumin / creatinine urine ratio  2. Long-term (current) use of injectable non-insulin  antidiabetic drugs See #1 above.  3. Hypertension associated with type 2 diabetes mellitus (HCC) At goal. Continue amlodipine  10 mg daily, hydralazine  50 mg three times a day, and losartan  50 mg daily.  4. Hyperlipidemia associated with type 2 diabetes mellitus (HCC) Continue rosuvastatin   5. CKD stage 3a, GFR 45-59 ml/min (HCC) Stable.  We will continue to monitor  6. Hypercalcemia Being followed by endocrinology.  Overall thought is that she likely has hyperparathyroidism.  Imaging study of parathyroid  glands recommended but patient unable to afford co-pay at this time. - Basic  Metabolic Panel  7. Need for immunization against influenza - Flu vaccine trivalent PF, 6mos and older(Flulaval,Afluria,Fluarix,Fluzone)   Patient was given the opportunity to ask questions.  Patient verbalized understanding of the plan and was able to repeat key elements of the plan.   This documentation was completed using Paediatric nurse.  Any transcriptional errors are unintentional.  Orders Placed This Encounter  Procedures   Flu vaccine trivalent PF, 6mos and older(Flulaval,Afluria,Fluarix,Fluzone)   POCT glucose (manual entry)   POCT glycosylated hemoglobin (Hb A1C)     Requested  Prescriptions    No prescriptions requested or ordered in this encounter    No follow-ups on file.  Barnie Louder, MD, FACP     [1]  Current Outpatient Medications on File Prior to Visit  Medication Sig Dispense Refill   Accu-Chek Softclix Lancets lancets USE TO CHECK BLOOD SUGAR 3 TIMES DAILY. 300 each 3   albuterol  (VENTOLIN  HFA) 108 (90 Base) MCG/ACT inhaler Inhale 2 puffs into the lungs every 6 (six) hours as needed for wheezing or shortness of breath. 8 g 0   amLODipine  (NORVASC ) 10 MG tablet TAKE 1 TABLET EVERY DAY 90 tablet 3   Blood Glucose Monitoring Suppl (ACCU-CHEK GUIDE) w/Device KIT Use to check blood sugar 3 times daily. 1 kit 0   buPROPion  (WELLBUTRIN  SR) 200 MG 12 hr tablet Take 1 tablet (200 mg total) by mouth 2 (two) times daily. 180 tablet 0   Cholecalciferol (VITAMIN D3) 25 MCG (1000 UT) CAPS Take 1 capsule (1,000 Units total) by mouth daily. Per Endo in 10/2022 60 capsule    Continuous Glucose Receiver (FREESTYLE LIBRE 3 READER) DEVI USE AS DIRECTED 6 each 3   Continuous Glucose Sensor (FREESTYLE LIBRE 3 SENSOR) MISC USE AS DIRECTED TO CHECK BLOOD GLUCOSE CONTINUOUSLY - CHANGE SENSOR EVERY 14 DAYS 2 each 5   cyanocobalamin  (VITAMIN B12) 500 MCG tablet 500 mcg every other day     dorzolamide -timolol  (COSOPT ) 2-0.5 % ophthalmic solution Place 1 drop into  both eyes 2 (two) times daily. 10 mL 3   DULoxetine  (CYMBALTA ) 20 MG capsule TAKE 1 CAPSULE DAILY. START AFTER TAPERING OFF GABAPENTIN  90 capsule 3   fluticasone  (FLONASE ) 50 MCG/ACT nasal spray USE 2 SPRAYS IN EACH NOSTRIL EVERY DAY 48 g 3   glucose blood (ACCU-CHEK GUIDE TEST) test strip USE TO CHECK BLOOD SUGAR 3 TIMES DAILY. 300 strip 0   hydrALAZINE  (APRESOLINE ) 50 MG tablet Take 1 tablet (50 mg total) by mouth 3 (three) times daily. 270 tablet 3   Insulin  Pen Needle (DROPLET PEN NEEDLES) 29G X MISC USE AS DIRECTED 100 each 0   Lancets Misc. (ACCU-CHEK SOFTCLIX LANCET DEV) KIT Check blood sugars 3 times a day. 3 kit 6   latanoprost  (XALATAN ) 0.005 % ophthalmic solution Place 1 drop into both eyes every evening at bedtime as directed 2.5 mL 6   levocetirizine (XYZAL ) 5 MG tablet Take 1 tablet (5 mg total) by mouth every evening. 90 tablet 0   lidocaine  (LIDODERM ) 5 % APPLY 1 PATCH ONTO THE SKIN DAILY. REMOVE AND DISCARD PATCH WITHIN 12 HOURS OR AS DIRECTED BY MD 90 patch 1   losartan  (COZAAR ) 50 MG tablet TAKE 1 TABLET EVERY DAY 90 tablet 0   metFORMIN  (GLUCOPHAGE ) 500 MG tablet TAKE 1 TABLET EVERY DAY WITH BREAKFAST 90 tablet 3   methocarbamol  (ROBAXIN ) 500 MG tablet Take 500 mg by mouth 2 (two) times daily.     montelukast  (SINGULAIR ) 10 MG tablet Take 1 tablet (10 mg total) by mouth at bedtime. 90 tablet 0   omega-3 acid ethyl esters (LOVAZA ) 1 g capsule Take 2 capsules (2 g total) by mouth 2 (two) times daily. 360 capsule 0   omeprazole  (PRILOSEC) 20 MG capsule Take 1 capsule (20 mg total) by mouth daily. 90 capsule 1   ondansetron  (ZOFRAN ) 4 MG tablet Take 1 tablet by mouth every 6 hours as needed severe nausea 30 tablet 0   polyethylene glycol powder (GLYCOLAX /MIRALAX ) 17 GM/SCOOP powder Take 17 g by mouth 2 (two) times daily  as needed.     rosuvastatin  (CRESTOR ) 20 MG tablet TAKE 1 TABLET EVERY DAY 90 tablet 3   solifenacin  (VESICARE ) 5 MG tablet TAKE 1 TABLET EVERY DAY 90 tablet 3    tirzepatide  (MOUNJARO ) 12.5 MG/0.5ML Pen Inject 12.5 mg into the skin once a week. 2 mL 0   No current facility-administered medications on file prior to visit.  [2]  Allergies Allergen Reactions   Emend [Aprepitant] Other (See Comments)    Urticaria    Lisinopril  Cough

## 2024-05-29 ENCOUNTER — Ambulatory Visit: Payer: Self-pay | Admitting: Internal Medicine

## 2024-05-29 LAB — BASIC METABOLIC PANEL WITH GFR
BUN/Creatinine Ratio: 16 (ref 9–23)
BUN: 18 mg/dL (ref 6–24)
CO2: 24 mmol/L (ref 20–29)
Calcium: 12.5 mg/dL — ABNORMAL HIGH (ref 8.7–10.2)
Chloride: 104 mmol/L (ref 96–106)
Creatinine, Ser: 1.12 mg/dL — ABNORMAL HIGH (ref 0.57–1.00)
Glucose: 86 mg/dL (ref 70–99)
Potassium: 4.3 mmol/L (ref 3.5–5.2)
Sodium: 142 mmol/L (ref 134–144)
eGFR: 57 mL/min/1.73 — ABNORMAL LOW

## 2024-05-29 LAB — MICROALBUMIN / CREATININE URINE RATIO
Creatinine, Urine: 83.3 mg/dL
Microalb/Creat Ratio: 10 mg/g{creat} (ref 0–29)
Microalbumin, Urine: 8.1 ug/mL

## 2024-06-08 ENCOUNTER — Ambulatory Visit (INDEPENDENT_AMBULATORY_CARE_PROVIDER_SITE_OTHER): Admitting: Family Medicine

## 2024-06-14 ENCOUNTER — Other Ambulatory Visit: Payer: Self-pay | Admitting: Internal Medicine

## 2024-06-14 ENCOUNTER — Encounter: Payer: Self-pay | Admitting: Internal Medicine

## 2024-06-14 ENCOUNTER — Ambulatory Visit (INDEPENDENT_AMBULATORY_CARE_PROVIDER_SITE_OTHER): Payer: Self-pay | Admitting: Family Medicine

## 2024-06-14 ENCOUNTER — Encounter (INDEPENDENT_AMBULATORY_CARE_PROVIDER_SITE_OTHER): Payer: Self-pay | Admitting: Family Medicine

## 2024-06-14 VITALS — BP 123/79 | HR 76 | Temp 98.0°F | Ht 64.0 in | Wt 175.0 lb

## 2024-06-14 DIAGNOSIS — E1169 Type 2 diabetes mellitus with other specified complication: Secondary | ICD-10-CM

## 2024-06-14 DIAGNOSIS — E669 Obesity, unspecified: Secondary | ICD-10-CM | POA: Diagnosis not present

## 2024-06-14 DIAGNOSIS — I152 Hypertension secondary to endocrine disorders: Secondary | ICD-10-CM

## 2024-06-14 DIAGNOSIS — E66811 Obesity, class 1: Secondary | ICD-10-CM

## 2024-06-14 DIAGNOSIS — E1159 Type 2 diabetes mellitus with other circulatory complications: Secondary | ICD-10-CM

## 2024-06-14 DIAGNOSIS — Z7985 Long-term (current) use of injectable non-insulin antidiabetic drugs: Secondary | ICD-10-CM | POA: Diagnosis not present

## 2024-06-14 DIAGNOSIS — Z6837 Body mass index (BMI) 37.0-37.9, adult: Secondary | ICD-10-CM | POA: Diagnosis not present

## 2024-06-14 DIAGNOSIS — Z794 Long term (current) use of insulin: Secondary | ICD-10-CM | POA: Diagnosis not present

## 2024-06-14 DIAGNOSIS — I10 Essential (primary) hypertension: Secondary | ICD-10-CM | POA: Diagnosis not present

## 2024-06-14 MED ORDER — TIRZEPATIDE 12.5 MG/0.5ML ~~LOC~~ SOAJ: 12.5000 mg | SUBCUTANEOUS | 0 refills | Status: AC

## 2024-06-14 NOTE — Progress Notes (Signed)
 "  Sarah [name], D.O.  ABFM, ABOM Specializing in Clinical Bariatric Medicine  Office located at: [address] [place], [place]  [address]      A) FOR THE CHRONIC DISEASE OF OBESITY: Obesity with starting BMI 37.9 Obesity (BMI 30.0-34.9) current BMI  Chief complaint: Obesity Sarah [name] is here to discuss her progress with her obesity treatment plan.   History of present illness / Interval history:  Sarah [name] is here today for her follow-up office visit.  Since last OV on 05/18/2024, pt is up 3 lbs.  Pt reports she ate off plan foods during the holidays, but coming into the year her goal is to be intentional with her dietary habits.   05/18/24 10:00 06/14/24 07:00   Body Fat % 37.7 % 37.7 %  Muscle Mass (lbs) 101.8 lbs 103.8 lbs  Fat Mass (lbs) 64.8 lbs 66.2 lbs  Total Body Water  (lbs) 65.2 lbs 66.8 lbs  Visceral Fat Rating  9 9  Counseling done on how various foods will affect these numbers and how to maximize success   Total lbs lost to date: -53 lbs Total Fat Mass in lbs lost to date: -31.4 Total weight loss percentage to date: -23.25 %   Nutrition Therapy She is journaling 1200-1300 cal and 85++ g protein and states she is following her eating plan approximately 80 % of the time.   - Tracking Calories/Macros: yes  - Eating More Whole Foods: yes  - Adequate Protein Intake: no - ***  - Adequate Water  Intake: yes  - Skipping Meals: no - ***  - Sleeping 7-9 Hours/ Night: no - ***   Sarah [name] is currently in the action stage of change. As such, her goal is to continue weight management plan.  She has agreed to: continue current plan   Physical Activity Pt is working out and going to the gym 50  minutes 3 days per week   Sarah [name] has been advised to work up to 300-450 minutes of moderate intensity aerobic activity a week and strengthening exercises 2-3 times per week for cardiovascular health, weight loss maintenance and preservation of muscle mass.  She  has agreed to : Think about enjoyable ways to increase daily physical activity and overcoming barriers to exercise, Increase physical activity in their day and reduce sedentary time (increase NEAT)., Continue to gradually increase the amount and intensity of exercise routine, Increase volume of physical activity to a goal of 240 minutes a week, and Combine aerobic and strengthening exercises for efficiency and improved cardiometabolic health.   Behavioral Modifications Evidence-based interventions for health behavior change were utilized today including the discussion of  1) self monitoring techniques:  journaling 2) problem-solving barriers:  being more intentional with dietary habits 3) self care:  exercise 4) SMART goals for next OV:  Increase protein intake, eat 3 meals a day, and increase exercise to 5 days week. Regarding patient's less desirable eating habits and patterns, we employed the technique of small changes.   We discussed the following today: increasing lean protein intake to established goals and continue to work on implementation of reduced calorie nutritional plan Additional resources provided today: None   Medical Interventions/ Pharmacotherapy Previous Bariatric surgery: none Pharmacotherapy for weight loss: She is currently taking Wellbutrin  SR 200 mg twice daily and Mounjaro  12.5 mg once weekly for medical weight loss.    We discussed various medication options to help Sarah [name] with her weight loss efforts and we both agreed to : Continue with  current nutritional and behavioral strategies   B) OBESITY RELATED CONDITIONS ADDRESSED TODAY:  Type 2 diabetes mellitus in patient with obesity [facility]) Assessment & Plan Lab Results  Component Value Date   HGBA1C 5.5 05/27/2024   HGBA1C 5.6 01/22/2024   HGBA1C 6.0 (H) 07/23/2023  Currently on Mounjaro  12.5 mg once weekly with good compliance and tolerance. Previously on Mounjaro  15 mg, discontinued due to experiencing symptoms  of increased nausea. Previously on Metformin  500 mg daily, discontinued on 05/27/2024 due to sugars  being well controlled. Reports good control of hunger and cravings. Denies nausea and GI side effects. A1c has improved from 5.6 on 01/22/2024 to 5.5 on 05/27/2024. No acute concerns today. Cont medication(refill today). Cont decreasing simple carbs and increasing lean protein. Increase exercise as able.     Hypertension associated with type 2 diabetes mellitus [facility]) Assessment & Plan BP Readings from Last 3 Encounters:  06/14/24 123/79  05/27/24 120/76  05/18/24 123/80   The 10-year ASCVD risk score (Arnett DK, et al., 2019) is: 11.3%  Lab Results  Component Value Date   CREATININE 1.12 (H) 05/27/2024   Lab Results  Component Value Date   LABMICR 8.1 05/27/2024   LABMICR 11.0 05/22/2023   MICROALBUR 80 11/13/2016   MICROALBUR 0.7 07/27/2015  Currently on Norvasc  10 mg daily, Cozaar  50 mg daily, and hydralazine  50 mg 3 times daily with good compliance and tolerance. BP is well controlled at 123/79. Microalbumin/creatinine ratio has improved from 11.0 to 8.1, indicating kidney's are filtering better and improved kidney function. Cont adherence to medication. Cont low-sodium, heart-healthy diet. Increase water  and exercise as able.     Hypercalcemia Assessment & Plan Lab Results  Component Value Date   CREATININE 1.12 (H) 05/27/2024   BUN 18 05/27/2024   NA 142 05/27/2024   K 4.3 05/27/2024   CL 104 05/27/2024   CO2 24 05/27/2024   Lab Results  Component Value Date   PTH 44 04/20/2024   CALCIUM  12.5 (H) 05/27/2024   CAION 6.4 (H) 04/20/2024   PHOS 2.5 04/20/2024  Managed by endocrinology. Labs show elevated levels of Ca at 6.4; optimal <5.5. Encouraged to follow up with endocrinology of Ca and alternative management options due to the cost of a parathyroid  scan. Will continue to monitor alongside PCP and specialists.      Medications Discontinued During This Encounter   Medication Reason   tirzepatide  (MOUNJARO ) 12.5 MG/0.5ML Pen Reorder     Meds ordered this encounter  Medications   tirzepatide  (MOUNJARO ) 12.5 MG/0.5ML Pen    Sig: Inject 12.5 mg into the skin once a week.    Dispense:  2 mL    Refill:  0      Follow up:   Return 07/19/2024 10:00 AM.  She was informed of the importance of frequent follow up visits to maximize her success with intensive lifestyle modifications for her multiple health conditions.   Weight Summary and Biometrics   Weight Lost Since Last Visit: 0lb  Weight Gained Since Last Visit: 3lb    Vitals Temp: 98 F (36.7 C) BP: 123/79 Pulse Rate: 76 SpO2: 100 %   Anthropometric Measurements Height: 5' 4 (1.626 m) Weight: 175 lb (79.4 kg) BMI (Calculated): 30.02 Weight at Last Visit: 172lb Weight Lost Since Last Visit: 0lb Weight Gained Since Last Visit: 3lb Starting Weight: 228lb Total Weight Loss (lbs): 53 lb (24 kg) Peak Weight: 228lb   Body Composition  Body Fat %: 37.7 % Fat Mass (lbs): 66.2 lbs  Muscle Mass (lbs): 103.8 lbs Total Body Water  (lbs): 66.8 lbs Visceral Fat Rating : 9   Other Clinical Data Fasting: yes Labs: no Today's Visit #: 94 Starting Date: 02/15/20    Objective:   PHYSICAL EXAM: Blood pressure 123/79, pulse 76, temperature 98 F (36.7 C), height 5' 4 (1.626 m), weight 175 lb (79.4 kg), SpO2 100%. Body mass index is 30.04 kg/m.  General: she is overweight, cooperative and in no acute distress. PSYCH: Has normal mood, affect and thought process.   HEENT: EOMI, sclerae are anicteric. Lungs: Normal breathing effort, no conversational dyspnea. Extremities: Moves * 4 Neurologic: A and O * 3, good insight  DIAGNOSTIC DATA REVIEWED: BMET    Component Value Date/Time   NA 142 05/27/2024 1127   NA 138 04/22/2016 0750   K 4.3 05/27/2024 1127   K 3.9 04/22/2016 0750   CL 104 05/27/2024 1127   CO2 24 05/27/2024 1127   CO2 26 04/22/2016 0750   GLUCOSE 86 05/27/2024  1127   GLUCOSE 102 (H) 10/21/2023 0926   GLUCOSE 142 (H) 04/22/2016 0750   BUN 18 05/27/2024 1127   BUN 19.1 11/20/2016 1355   CREATININE 1.12 (H) 05/27/2024 1127   CREATININE 1.10 (H) 10/21/2023 0926   CREATININE 1.1 11/20/2016 1355   CALCIUM  12.5 (H) 05/27/2024 1127   CALCIUM  9.8 04/22/2016 0750   GFRNONAA 47 (L) 03/28/2022 1508   GFRNONAA 48 (L) 12/29/2019 1015   GFRNONAA 63 07/08/2014 1212   GFRAA 69 04/17/2020 0928   GFRAA 56 (L) 12/29/2019 1015   GFRAA 73 07/08/2014 1212   Lab Results  Component Value Date   HGBA1C 5.5 05/27/2024   HGBA1C 10.1 (H) 07/07/2015   No results found for: INSULIN  Lab Results  Component Value Date   TSH 1.16 10/21/2023   CBC    Component Value Date/Time   WBC 5.4 03/28/2022 1508   RBC 4.97 03/28/2022 1508   HGB 15.0 03/28/2022 1508   HGB 13.3 02/03/2020 0837   HGB 13.6 12/04/2018 0914   HGB 12.9 04/22/2016 0750   HCT 46.8 (H) 03/28/2022 1508   HCT 41.0 12/04/2018 0914   HCT 39.0 04/22/2016 0750   PLT 318 03/28/2022 1508   PLT 270 02/03/2020 0837   PLT 332 12/04/2018 0914   MCV 94.2 03/28/2022 1508   MCV 88 12/04/2018 0914   MCV 92.2 04/22/2016 0750   MCH 30.2 03/28/2022 1508   MCHC 32.1 03/28/2022 1508   RDW 13.6 03/28/2022 1508   RDW 13.6 12/04/2018 0914   RDW 13.9 04/22/2016 0750   Iron Studies    Component Value Date/Time   IRON 43 08/18/2010 0515   TIBC 389 08/18/2010 0515   FERRITIN 227 08/18/2010 0515   IRONPCTSAT 11 (L) 08/18/2010 0515   Lipid Panel     Component Value Date/Time   CHOL 161 07/23/2023 0925   TRIG 162 (H) 07/23/2023 0925   HDL 47 07/23/2023 0925   CHOLHDL 3.4 07/23/2023 0925   CHOLHDL 5.1 (H) 08/14/2016 1729   VLDL 74 (H) 08/14/2016 1729   LDLCALC 86 07/23/2023 0925   Hepatic Function Panel     Component Value Date/Time   PROT 7.9 10/21/2023 0926   PROT 7.3 07/23/2023 0925   PROT 8.2 04/22/2016 0750   ALBUMIN 4.6 07/23/2023 0925   ALBUMIN 3.8 04/22/2016 0750   AST 26 10/21/2023 0926    AST 29 12/29/2019 1015   AST 27 04/22/2016 0750   ALT 24 10/21/2023 0926   ALT 33  12/29/2019 1015   ALT 24 04/22/2016 0750   ALKPHOS 82 07/23/2023 0925   ALKPHOS 74 04/22/2016 0750   BILITOT 0.6 10/21/2023 0926   BILITOT 0.5 07/23/2023 0925   BILITOT 0.6 12/29/2019 1015   BILITOT 0.44 04/22/2016 0750   BILIDIR 0.2 08/18/2010 0515   IBILI 1.5 (H) 08/18/2010 0515      Component Value Date/Time   TSH 1.16 10/21/2023 0926   Nutritional Lab Results  Component Value Date   VD25OH 69 04/20/2024   VD25OH 38 10/21/2023   VD25OH 33.96 04/23/2023    Attestations:   LILLETTE Feliciano Mingle, acting as a stage manager for Sarah Jenkins, DO., have compiled all relevant documentation for today's office visit on behalf of Sarah Jenkins, DO, while in the presence of [employer], DO.    I have reviewed the above documentation for accuracy and completeness, and I agree with the above. Sarah [name], D.O.  The 21st Century Cures Act was signed into law in 2016 which includes the topic of electronic health records.  This provides immediate access to information in MyChart.  This includes consultation notes, operative notes, office notes, lab results and pathology reports.  If you have any questions about what you read please let us  know at your next visit so we can discuss your concerns and take corrective action if need be.  We are right here with you.  "

## 2024-06-16 ENCOUNTER — Other Ambulatory Visit: Payer: Self-pay | Admitting: Internal Medicine

## 2024-06-16 MED ORDER — FREESTYLE LIBRE 3 READER DEVI: 0 refills | Status: AC

## 2024-07-06 ENCOUNTER — Ambulatory Visit: Payer: Medicare PPO | Attending: Internal Medicine

## 2024-07-06 VITALS — Ht 64.0 in | Wt 179.0 lb

## 2024-07-06 DIAGNOSIS — Z0000 Encounter for general adult medical examination without abnormal findings: Secondary | ICD-10-CM

## 2024-07-06 NOTE — Progress Notes (Signed)
 "  Chief Complaint  Patient presents with   Medicare Wellness    SUBSEQUENT     Subjective:   Sarah [name] is a 60 y.o. female who presents for a Medicare Annual Wellness Visit.  Visit info / Clinical Intake: Medicare Wellness Visit Type:: Subsequent Annual Wellness Visit Persons participating in visit and providing information:: patient Medicare Wellness Visit Mode:: Telephone If telephone:: video declined Since this visit was completed virtually, some vitals may be partially provided or unavailable. Missing vitals are due to the limitations of the virtual format.: Documented vitals are patient reported If Telephone or Video please confirm:: I connected with patient using audio/video enable telemedicine. I verified patient identity with two identifiers, discussed telehealth limitations, and patient agreed to proceed. Patient Location:: HOME Provider Location:: HOME OFFICE Interpreter Needed?: No Pre-visit prep was completed: yes AWV questionnaire completed by patient prior to visit?: no Living arrangements:: with family/others (LIVES WITH MOTHER) Patient's Overall Health Status Rating: (!) fair Typical amount of pain: some Does pain affect daily life?: no Are you currently prescribed opioids?: no  Dietary Habits and Nutritional Risks How many meals a day?: 2 Eats fruit and vegetables daily?: yes Most meals are obtained by: preparing own meals In the last 2 weeks, have you had any of the following?: none Diabetic:: (!) yes Any non-healing wounds?: no How often do you check your BS?: continuous glucose monitor Would you like to be referred to a Nutritionist or for Diabetic Management? : no  Functional Status Activities of Daily Living (to include ambulation/medication): Independent Ambulation: Independent Medication Administration: Independent Home Management (perform basic housework or laundry): Independent Manage your own finances?: yes Primary transportation is:  driving Concerns about vision?: no *vision screening is required for WTM* Concerns about hearing?: no  Fall Screening Falls in the past year?: 0 Number of falls in past year: 0 Was there an injury with Fall?: 0 Fall Risk Category Calculator: 0 Patient Fall Risk Level: Low Fall Risk  Fall Risk Patient at Risk for Falls Due to: No Fall Risks Fall risk Follow up: Falls evaluation completed  Home and Transportation Safety: All rugs have non-skid backing?: N/A, no rugs All stairs or steps have railings?: N/A, no stairs (FRONT ENTRY-3 STEPS) Grab bars in the bathtub or shower?: (!) no Have non-skid surface in bathtub or shower?: (!) no Good home lighting?: yes Regular seat belt use?: yes Hospital stays in the last year:: no  Cognitive Assessment Will 6CIT or Mini Cog be Completed: yes What year is it?: 0 points What month is it?: 0 points Give patient an address phrase to remember (5 components): [address] [place] About what time is it?: 0 points Count backwards from 20 to 1: 0 points Say the months of the year in reverse: 0 points Repeat the address phrase from earlier: 0 points 6 CIT Score: 0 points  Advance Directives (For Healthcare) Does Patient Have a Medical Advance Directive?: No Would patient like information on creating a medical advance directive?: No - Patient declined  Reviewed/Updated  Reviewed/Updated: Reviewed All (Medical, Surgical, Family, Medications, Allergies, Care Teams, Patient Goals)    Allergies (verified) Emend [aprepitant] and Lisinopril    Current Medications (verified) Outpatient Encounter Medications as of 07/06/2024  Medication Sig   Accu-Chek Softclix Lancets lancets USE TO CHECK BLOOD SUGAR 3 TIMES DAILY.   albuterol  (VENTOLIN  HFA) 108 (90 Base) MCG/ACT inhaler Inhale 2 puffs into the lungs every 6 (six) hours as needed for wheezing or shortness of breath.  amLODipine  (NORVASC ) 10 MG tablet TAKE 1 TABLET EVERY DAY   Blood  Glucose Monitoring Suppl (ACCU-CHEK GUIDE) w/Device KIT Use to check blood sugar 3 times daily.   buPROPion  (WELLBUTRIN  SR) 200 MG 12 hr tablet Take 1 tablet (200 mg total) by mouth 2 (two) times daily.   Cholecalciferol (VITAMIN D3) 25 MCG (1000 UT) CAPS Take 1 capsule (1,000 Units total) by mouth daily. Per Endo in 10/2022   Continuous Glucose Receiver (FREESTYLE LIBRE 3 READER) DEVI UAD to check blood sugars.   Continuous Glucose Sensor (FREESTYLE LIBRE 3 SENSOR) MISC USE AS DIRECTED TO CHECK BLOOD GLUCOSE CONTINUOUSLY - CHANGE SENSOR EVERY 14 DAYS   cyanocobalamin  (VITAMIN B12) 500 MCG tablet 500 mcg every other day   dorzolamide -timolol  (COSOPT ) 2-0.5 % ophthalmic solution Place 1 drop into both eyes 2 (two) times daily.   DULoxetine  (CYMBALTA ) 20 MG capsule TAKE 1 CAPSULE DAILY. START AFTER TAPERING OFF GABAPENTIN    fluticasone  (FLONASE ) 50 MCG/ACT nasal spray USE 2 SPRAYS IN EACH NOSTRIL EVERY DAY   glucose blood (ACCU-CHEK GUIDE TEST) test strip USE TO CHECK BLOOD SUGAR 3 TIMES DAILY.   hydrALAZINE  (APRESOLINE ) 50 MG tablet Take 1 tablet (50 mg total) by mouth 3 (three) times daily.   Insulin  Pen Needle (DROPLET PEN NEEDLES) 29G X MISC USE AS DIRECTED   Lancets Misc. (ACCU-CHEK SOFTCLIX LANCET DEV) KIT Check blood sugars 3 times a day.   latanoprost  (XALATAN ) 0.005 % ophthalmic solution Place 1 drop into both eyes every evening at bedtime as directed   levocetirizine (XYZAL ) 5 MG tablet Take 1 tablet (5 mg total) by mouth every evening.   lidocaine  (LIDODERM ) 5 % APPLY 1 PATCH ONTO THE SKIN DAILY. REMOVE AND DISCARD PATCH WITHIN 12 HOURS OR AS DIRECTED BY MD   losartan  (COZAAR ) 50 MG tablet TAKE 1 TABLET EVERY DAY   methocarbamol  (ROBAXIN ) 500 MG tablet Take 500 mg by mouth 2 (two) times daily.   montelukast  (SINGULAIR ) 10 MG tablet Take 1 tablet (10 mg total) by mouth at bedtime.   omega-3 acid ethyl esters (LOVAZA ) 1 g capsule Take 2 capsules (2 g total) by mouth 2 (two) times daily.    omeprazole  (PRILOSEC) 20 MG capsule Take 1 capsule (20 mg total) by mouth daily.   ondansetron  (ZOFRAN ) 4 MG tablet Take 1 tablet by mouth every 6 hours as needed severe nausea   polyethylene glycol powder (GLYCOLAX /MIRALAX ) 17 GM/SCOOP powder Take 17 g by mouth 2 (two) times daily as needed.   rosuvastatin  (CRESTOR ) 20 MG tablet TAKE 1 TABLET EVERY DAY   solifenacin  (VESICARE ) 5 MG tablet TAKE 1 TABLET EVERY DAY   tirzepatide  (MOUNJARO ) 12.5 MG/0.5ML Pen Inject 12.5 mg into the skin once a week.   No facility-administered encounter medications on file as of 07/06/2024.    History: Past Medical History:  Diagnosis Date   Arthritis    Diabetes mellitus without complication (HCC)    Family history of adverse reaction to anesthesia    sister-n/v   Family history of colon cancer    GERD (gastroesophageal reflux disease)    Glaucoma 07/2014   Glucagonoma 07/28/2014   Pt denies this but states she has glaucoma   History of chemotherapy    History of hiatal hernia    Hypercalcemia    Hypertension    Imbalance    Neuropathy    feet bilat   Nocturia    Peritoneal carcinomatosis (HCC)    carcinoma of ovary    Shortness of  breath dyspnea    hx of 2013 - no problems currently    Sleep apnea    Thyroid  nodule    history of    Wears glasses    Past Surgical History:  Procedure Laterality Date   ABDOMINAL HYSTERECTOMY     CATARACT EXTRACTION     RT eye 11/28/22 and LT eye 12/19/2022   FOOT SURGERY  04/2018   [facility]   HERNIA REPAIR     INCISIONAL HERNIA REPAIR N/A 07/06/2015   Procedure: INCISIONAL HERNIA REPAIR ;  Surgeon: Donnice Bury, MD;  Location: [facility];  Service: General;  Laterality: N/A;   INCISIONAL HERNIA REPAIR N/A 07/03/2021   Procedure: SHIRLENE HERNIA REPAIR WITH MESH;  Surgeon: Rubin Calamity, MD;  Location[facility];  Service: General;  Laterality: N/A;   INSERTION OF MESH N/A 07/06/2015   Procedure: WITH INSERTION OF PHASIX ST MESH;  Surgeon: Donnice Bury, MD;  Location: [facility];  Service: General;  Laterality: N/A;   LAPAROTOMY N/A 07/06/2015   Procedure: EXPLORATORY LAPAROTOMY;  Surgeon: Maurilio Ship, MD;  Location: [facility];  Service: Gynecology;  Laterality: N/A;   LYSIS OF ADHESION N/A 07/06/2015   Procedure:  LYSIS OF ADHESION RESECTION OF MESENTERIC MASS BOWEL RESECTION ;  Surgeon: Maurilio Ship, MD;  Location: [facility];  Service: Gynecology;  Laterality: N/A;   LYSIS OF ADHESION N/A 07/03/2021   Procedure: EXPLORATORY LAPAROTOMY WITH LYSIS OF ADHESION;  Surgeon: Rubin Calamity, MD;  Location: [facility];  Service: General;  Laterality: N/A;   ROBOTIC ASSISTED TOTAL HYSTERECTOMY WITH BILATERAL SALPINGO OOPHERECTOMY Bilateral 07/28/2014   Procedure: ROBOTIC ASSISTED lysis of adhesions with biopsies, converted to LAPAROTOMY, bilateral salpingoorphorectomy, omentectomy,appendectomy;  Surgeon: Sari Bachelor, MD;  Location: [facility];  Service: Gynecology;  Laterality: Bilateral;   THYROIDECTOMY, PARTIAL     VIDEO BRONCHOSCOPY N/A 01/13/2020   Procedure: VIDEO BRONCHOSCOPY WITH BIOPSIES;  Surgeon: Kerrin Elspeth BROCKS, MD;  Location: [facility] OR;  Service: Thoracic;  Laterality: N/A;   Family History  Problem Relation Age of Onset   Hypertension Mother    Hyperlipidemia Mother    Stroke Mother    Thyroid  disease Mother    Arthritis Mother    Hearing loss Mother    Hypertension Father    Diabetes Father    Hyperlipidemia Father    Sudden death Father    Arthritis Father    Heart disease Father    Obesity Father    Cancer Sister 73       fibrosarcoma (back); currently 43   Cancer Brother    Prostate cancer Maternal Uncle    Colon cancer Paternal Aunt        Dx 36s; deceased 50s   Prostate cancer Paternal Uncle        currently 40   Cancer Paternal Uncle 68       bone ; unk. primary   Stomach cancer Paternal Uncle    Hypercalcemia Neg Hx    Social History   Occupational History   Occupation: disability  Tobacco Use   Smoking  status: Never   Smokeless tobacco: Never  Vaping Use   Vaping status: Never Used  Substance and Sexual Activity   Alcohol use: No   Drug use: No   Sexual activity: Not Currently    Birth control/protection: None   Tobacco Counseling Counseling given: Not Answered  SDOH Screenings   Food Insecurity: Food Insecurity Present (07/06/2024)  Housing: Low Risk (07/06/2024)  Recent Concern: Housing - High Risk (  05/24/2024)  Transportation Needs: No Transportation Needs (07/06/2024)  Utilities: Not At Risk (07/06/2024)  Alcohol Screen: Low Risk (07/06/2024)  Depression (PHQ2-9): Low Risk (07/06/2024)  Financial Resource Strain: Low Risk  (06/22/2024)   Received from [facility]  Recent Concern: Financial Resource Strain - Medium Risk (05/24/2024)  Physical Activity: Sufficiently Active (07/06/2024)  Social Connections: Moderately Integrated (07/06/2024)  Stress: Stress Concern Present (07/06/2024)  Tobacco Use: Low Risk (07/06/2024)  Health Literacy: Adequate Health Literacy (07/06/2024)   See flowsheets for full screening details  Depression Screen PHQ 2 & 9 Depression Scale- Over the past 2 weeks, how often have you been bothered by any of the following problems? Little interest or pleasure in doing things: 0 Feeling down, depressed, or hopeless (PHQ Adolescent also includes...irritable): 0 PHQ-2 Total Score: 0 Trouble falling or staying asleep, or sleeping too much: 3 (TROUBLE STAYING ASLEEP; AVERAGE OF SLEEP PER NIGHT: 5-6 HOURS; PATIENT TAKES NAPS DURING THE DAY SOMETIMES.) Feeling tired or having little energy: 1 Poor appetite or overeating (PHQ Adolescent also includes...weight loss): 0 Feeling bad about yourself - or that you are a failure or have let yourself or your family down: 0 Trouble concentrating on things, such as reading the newspaper or watching television (PHQ Adolescent also includes...like school work): 0 Moving or speaking so slowly that other people  could have noticed. Or the opposite - being so fidgety or restless that you have been moving around a lot more than usual: 0 Thoughts that you would be better off dead, or of hurting yourself in some way: 0 PHQ-9 Total Score: 4 If you checked off any problems, how difficult have these problems made it for you to do your work, take care of things at home, or get along with other people?: Not difficult at all  Depression Treatment Depression Interventions/Treatment : EYV7-0 Score <4 Follow-up Not Indicated     Goals Addressed             This Visit's Progress    07/06/2024: To continue with my weight management program, continue going to [facility] for my exercise class and getting down to 160 pounds.               Objective:    Today's Vitals   07/06/24 0953  Weight: 179 lb (81.2 kg)  Height: 5' 4 (1.626 m)  PainSc: 8   PainLoc: Hip   Body mass index is 30.73 kg/m.  Hearing/Vision screen Hearing Screening - Comments:: ADEQUATE HEARING. Vision Screening - Comments:: ADEQUATE VISION WITH OTC READERS. EYE EXAM COMPLETED BY: [facility] Immunizations and Health Maintenance Health Maintenance  Topic Date Due   HIV Screening  Never done   Hepatitis B Vaccines 19-59 Average Risk (1 of 3 - 19+ 3-dose series) Never done   COVID-19 Vaccine (4 - 2025-26 season) 02/09/2024   Diabetic kidney evaluation - Urine ACR  11/25/2024   HEMOGLOBIN A1C  11/25/2024   FOOT EXAM  01/21/2025   OPHTHALMOLOGY EXAM  02/12/2025   Diabetic kidney evaluation - eGFR measurement  05/27/2025   Mammogram  06/26/2025   Medicare Annual Wellness (AWV)  07/06/2025   Colonoscopy  10/03/2026   DTaP/Tdap/Td (2 - Td or Tdap) 11/18/2027   Pneumococcal Vaccine: 50+ Years  Completed   Influenza Vaccine  Completed   HPV VACCINES (No Doses Required) Completed   Hepatitis C Screening  Completed   Zoster Vaccines- Shingrix  Completed   Meningococcal B Vaccine  Aged Out  Assessment/Plan:  This is a  routine wellness examination for Sarah [name].  Patient Care Team: Vicci Barnie NOVAK, MD as PCP - General (Internal Medicine) Austin Olam CROME, MD as Consulting Physician Treasa Janeth Ruth as Consulting Physician (Endocrinology) Trixie File, MD as Consulting Physician (Internal Medicine)  I have personally reviewed and noted the following in the patients chart:   Medical and social history Use of alcohol, tobacco or illicit drugs  Current medications and supplements including opioid prescriptions. Functional ability and status Nutritional status Physical activity Advanced directives List of other physicians Hospitalizations, surgeries, and ER visits in previous 12 months Vitals Screenings to include cognitive, depression, and falls Referrals and appointments  No orders of the defined types were placed in this encounter.  In addition, I have reviewed and discussed with patient certain preventive protocols, quality metrics, and best practice recommendations. A written personalized care plan for preventive services as well as general preventive health recommendations were provided to patient.   Roz LOISE Fuller, LPN   [id number]/72/[id number]   Return in about 1 year (around 07/06/2025) for Medicare wellness.  After Visit Summary: (MyChart) Due to this being a telephonic visit, the after visit summary with patients personalized plan was offered to patient via MyChart   Nurse Notes: Patient aware of current care gaps.  Patient is due for Covid-19, HIV Screening, Hepatitis B. "

## 2024-07-06 NOTE — Patient Instructions (Signed)
 Ms. Rengel,  Thank you for taking the time for your Medicare Wellness Visit. I appreciate your continued commitment to your health goals. Please review the care plan we discussed, and feel free to reach out if I can assist you further.  Please note that Annual Wellness Visits do not include a physical exam. Some assessments may be limited, especially if the visit was conducted virtually. If needed, we may recommend an in-person follow-up with your provider.  Ongoing Care Seeing your primary care provider every 3 to 6 months helps us  monitor your health and provide consistent, personalized care.   Referrals If a referral was made during today's visit and you haven't received any updates within two weeks, please contact the referred provider directly to check on the status.  Recommended Screenings:  Health Maintenance  Topic Date Due   HIV Screening  Never done   Hepatitis B Vaccine (1 of 3 - 19+ 3-dose series) Never done   COVID-19 Vaccine (4 - 2025-26 season) 02/09/2024   Kidney health urinalysis for diabetes  11/25/2024   Hemoglobin A1C  11/25/2024   Complete foot exam   01/21/2025   Eye exam for diabetics  02/12/2025   Yearly kidney function blood test for diabetes  05/27/2025   Breast Cancer Screening  06/26/2025   Medicare Annual Wellness Visit  07/06/2025   Colon Cancer Screening  10/03/2026   DTaP/Tdap/Td vaccine (2 - Td or Tdap) 11/18/2027   Pneumococcal Vaccine for age over 27  Completed   Flu Shot  Completed   HPV Vaccine (No Doses Required) Completed   Hepatitis C Screening  Completed   Zoster (Shingles) Vaccine  Completed   Meningitis B Vaccine  Aged Out       07/06/2024    9:56 AM  Advanced Directives  Does Patient Have a Medical Advance Directive? No  Would patient like information on creating a medical advance directive? No - Patient declined    Vision: Annual vision screenings are recommended for early detection of glaucoma, cataracts, and diabetic  retinopathy. These exams can also reveal signs of chronic conditions such as diabetes and high blood pressure.  Dental: Annual dental screenings help detect early signs of oral cancer, gum disease, and other conditions linked to overall health, including heart disease and diabetes.  Please see the attached documents for additional preventive care recommendations.

## 2024-07-11 ENCOUNTER — Other Ambulatory Visit: Payer: Self-pay | Admitting: Internal Medicine

## 2024-07-11 DIAGNOSIS — E1159 Type 2 diabetes mellitus with other circulatory complications: Secondary | ICD-10-CM

## 2024-07-19 ENCOUNTER — Ambulatory Visit (INDEPENDENT_AMBULATORY_CARE_PROVIDER_SITE_OTHER): Admitting: Family Medicine

## 2024-07-29 ENCOUNTER — Ambulatory Visit: Admitting: Podiatry

## 2024-08-26 ENCOUNTER — Ambulatory Visit: Payer: Self-pay | Admitting: Internal Medicine

## 2024-10-19 ENCOUNTER — Ambulatory Visit: Admitting: Internal Medicine
